# Patient Record
Sex: Female | Born: 1950
Health system: Southern US, Community
[De-identification: ages and names within clinical notes are randomized; demographics above are authoritative.]

## PROBLEM LIST (undated history)

## (undated) DIAGNOSIS — Z972 Presence of dental prosthetic device (complete) (partial): Secondary | ICD-10-CM

## (undated) DIAGNOSIS — I10 Essential (primary) hypertension: Secondary | ICD-10-CM

## (undated) DIAGNOSIS — E079 Disorder of thyroid, unspecified: Secondary | ICD-10-CM

## (undated) DIAGNOSIS — N289 Disorder of kidney and ureter, unspecified: Secondary | ICD-10-CM

## (undated) DIAGNOSIS — G629 Polyneuropathy, unspecified: Secondary | ICD-10-CM

## (undated) DIAGNOSIS — A4902 Methicillin resistant Staphylococcus aureus infection, unspecified site: Secondary | ICD-10-CM

## (undated) DIAGNOSIS — D509 Iron deficiency anemia, unspecified: Secondary | ICD-10-CM

## (undated) DIAGNOSIS — A419 Sepsis, unspecified organism: Secondary | ICD-10-CM

## (undated) DIAGNOSIS — K219 Gastro-esophageal reflux disease without esophagitis: Secondary | ICD-10-CM

## (undated) DIAGNOSIS — N186 End stage renal disease: Secondary | ICD-10-CM

## (undated) DIAGNOSIS — J189 Pneumonia, unspecified organism: Secondary | ICD-10-CM

## (undated) DIAGNOSIS — E785 Hyperlipidemia, unspecified: Secondary | ICD-10-CM

## (undated) DIAGNOSIS — R569 Unspecified convulsions: Secondary | ICD-10-CM

## (undated) HISTORY — DX: Unspecified convulsions: R56.9

## (undated) HISTORY — PX: DILATION AND CURETTAGE OF UTERUS: SHX78

## (undated) HISTORY — DX: Iron deficiency anemia, unspecified: D50.9

## (undated) HISTORY — DX: Methicillin resistant Staphylococcus aureus infection, unspecified site: A49.02

## (undated) HISTORY — PX: EYE SURGERY: SHX253

---

## 2002-11-13 ENCOUNTER — Ambulatory Visit (HOSPITAL_COMMUNITY): Admission: RE | Admit: 2002-11-13 | Discharge: 2002-11-13 | Payer: Self-pay | Admitting: Internal Medicine

## 2004-03-17 ENCOUNTER — Ambulatory Visit: Payer: Self-pay | Admitting: Family Medicine

## 2004-03-18 ENCOUNTER — Ambulatory Visit: Payer: Self-pay | Admitting: Internal Medicine

## 2004-03-21 ENCOUNTER — Ambulatory Visit: Payer: Self-pay | Admitting: *Deleted

## 2004-05-06 ENCOUNTER — Ambulatory Visit: Payer: Self-pay | Admitting: Internal Medicine

## 2004-05-16 ENCOUNTER — Ambulatory Visit: Payer: Self-pay | Admitting: Internal Medicine

## 2004-05-24 ENCOUNTER — Ambulatory Visit: Payer: Self-pay | Admitting: Internal Medicine

## 2004-06-08 ENCOUNTER — Ambulatory Visit: Payer: Self-pay | Admitting: Internal Medicine

## 2004-06-08 ENCOUNTER — Ambulatory Visit (HOSPITAL_COMMUNITY): Admission: RE | Admit: 2004-06-08 | Discharge: 2004-06-08 | Payer: Self-pay | Admitting: Internal Medicine

## 2004-08-19 ENCOUNTER — Ambulatory Visit: Payer: Self-pay | Admitting: Family Medicine

## 2004-09-22 ENCOUNTER — Emergency Department (HOSPITAL_COMMUNITY): Admission: EM | Admit: 2004-09-22 | Discharge: 2004-09-22 | Payer: Self-pay | Admitting: Emergency Medicine

## 2004-10-04 ENCOUNTER — Ambulatory Visit: Payer: Self-pay | Admitting: Internal Medicine

## 2004-11-07 ENCOUNTER — Ambulatory Visit: Payer: Self-pay | Admitting: Internal Medicine

## 2005-01-05 ENCOUNTER — Ambulatory Visit: Payer: Self-pay | Admitting: Nurse Practitioner

## 2005-01-26 ENCOUNTER — Emergency Department (HOSPITAL_COMMUNITY): Admission: EM | Admit: 2005-01-26 | Discharge: 2005-01-26 | Payer: Self-pay | Admitting: Emergency Medicine

## 2005-01-27 ENCOUNTER — Ambulatory Visit: Payer: Self-pay | Admitting: Internal Medicine

## 2005-01-30 ENCOUNTER — Ambulatory Visit: Payer: Self-pay | Admitting: Internal Medicine

## 2005-02-20 ENCOUNTER — Ambulatory Visit: Payer: Self-pay | Admitting: Internal Medicine

## 2005-03-02 ENCOUNTER — Ambulatory Visit: Payer: Self-pay | Admitting: Internal Medicine

## 2005-03-08 ENCOUNTER — Ambulatory Visit: Payer: Self-pay | Admitting: Internal Medicine

## 2007-01-14 DIAGNOSIS — E785 Hyperlipidemia, unspecified: Secondary | ICD-10-CM

## 2007-01-14 DIAGNOSIS — F329 Major depressive disorder, single episode, unspecified: Secondary | ICD-10-CM

## 2007-01-14 DIAGNOSIS — F32A Depression, unspecified: Secondary | ICD-10-CM | POA: Insufficient documentation

## 2007-01-14 DIAGNOSIS — F411 Generalized anxiety disorder: Secondary | ICD-10-CM | POA: Insufficient documentation

## 2007-01-14 DIAGNOSIS — E119 Type 2 diabetes mellitus without complications: Secondary | ICD-10-CM | POA: Insufficient documentation

## 2007-01-14 DIAGNOSIS — D649 Anemia, unspecified: Secondary | ICD-10-CM | POA: Insufficient documentation

## 2007-01-14 DIAGNOSIS — E1140 Type 2 diabetes mellitus with diabetic neuropathy, unspecified: Secondary | ICD-10-CM

## 2007-01-14 DIAGNOSIS — I10 Essential (primary) hypertension: Secondary | ICD-10-CM | POA: Insufficient documentation

## 2008-12-31 ENCOUNTER — Emergency Department (HOSPITAL_COMMUNITY): Admission: EM | Admit: 2008-12-31 | Discharge: 2008-12-31 | Payer: Self-pay | Admitting: Emergency Medicine

## 2010-01-06 ENCOUNTER — Emergency Department (HOSPITAL_COMMUNITY): Admission: EM | Admit: 2010-01-06 | Discharge: 2010-01-06 | Payer: Self-pay | Admitting: Emergency Medicine

## 2010-10-09 LAB — BASIC METABOLIC PANEL
BUN: 30 mg/dL — ABNORMAL HIGH (ref 6–23)
CO2: 22 mEq/L (ref 19–32)
Calcium: 8.5 mg/dL (ref 8.4–10.5)
Chloride: 105 mEq/L (ref 96–112)
Creatinine, Ser: 2.15 mg/dL — ABNORMAL HIGH (ref 0.4–1.2)
GFR calc Af Amer: 28 mL/min — ABNORMAL LOW (ref >60)
GFR calc non Af Amer: 23 mL/min — ABNORMAL LOW (ref >60)
Glucose, Bld: 407 mg/dL — ABNORMAL HIGH (ref 70–99)
Potassium: 3.4 mEq/L — ABNORMAL LOW (ref 3.5–5.1)
Sodium: 137 mEq/L (ref 135–145)

## 2010-10-09 LAB — URINALYSIS, ROUTINE W REFLEX MICROSCOPIC
Bilirubin Urine: NEGATIVE
Glucose, UA: >1000 mg/dL — AB
Ketones, ur: NEGATIVE mg/dL
Leukocytes, UA: NEGATIVE
Nitrite: NEGATIVE
Protein, ur: 100 mg/dL — AB
Specific Gravity, Urine: 1.013 (ref 1.005–1.030)
Urobilinogen, UA: 0.2 mg/dL (ref 0.0–1.0)
pH: 7 (ref 5.0–8.0)

## 2010-10-09 LAB — GLUCOSE, CAPILLARY
Comment 2: 0
Glucose-Capillary: 173 mg/dL — ABNORMAL HIGH (ref 70–99)
Glucose-Capillary: 269 mg/dL — ABNORMAL HIGH (ref 70–99)
Glucose-Capillary: 485 mg/dL — ABNORMAL HIGH (ref 70–99)

## 2010-10-09 LAB — DIFFERENTIAL
Basophils Absolute: 0 10*3/uL (ref 0.0–0.1)
Basophils Relative: 0 % (ref 0–1)
Eosinophils Absolute: 0.1 10*3/uL (ref 0.0–0.7)
Eosinophils Relative: 3 % (ref 0–5)
Lymphocytes Relative: 12 % (ref 12–46)
Lymphs Abs: 0.7 10*3/uL (ref 0.7–4.0)
Monocytes Absolute: 0.4 10*3/uL (ref 0.1–1.0)
Monocytes Relative: 7 % (ref 3–12)
Neutro Abs: 4.1 10*3/uL (ref 1.7–7.7)
Neutrophils Relative %: 77 % (ref 43–77)

## 2010-10-09 LAB — POCT I-STAT, CHEM 8
BUN: 28 mg/dL — ABNORMAL HIGH (ref 6–23)
Calcium, Ion: 1.17 mmol/L (ref 1.12–1.32)
Chloride: 109 mEq/L (ref 96–112)
Creatinine, Ser: 1.9 mg/dL — ABNORMAL HIGH (ref 0.4–1.2)
Glucose, Bld: 172 mg/dL — ABNORMAL HIGH (ref 70–99)
HCT: 33 % — ABNORMAL LOW (ref 36.0–46.0)
Hemoglobin: 11.2 g/dL — ABNORMAL LOW (ref 12.0–15.0)
Potassium: 3.3 mEq/L — ABNORMAL LOW (ref 3.5–5.1)
Sodium: 143 mEq/L (ref 135–145)
TCO2: 21 mmol/L (ref 0–100)

## 2010-10-09 LAB — URINE CULTURE: Colony Count: >=100000

## 2010-10-09 LAB — CBC
HCT: 32.8 % — ABNORMAL LOW (ref 36.0–46.0)
Hemoglobin: 10.9 g/dL — ABNORMAL LOW (ref 12.0–15.0)
MCHC: 33.4 g/dL (ref 30.0–36.0)
MCV: 83.1 fL (ref 78.0–100.0)
Platelets: 211 10*3/uL (ref 150–400)
RBC: 3.94 MIL/uL (ref 3.87–5.11)
RDW: 13.8 % (ref 11.5–15.5)
WBC: 5.3 10*3/uL (ref 4.0–10.5)

## 2010-10-09 LAB — URINE MICROSCOPIC-ADD ON

## 2012-01-30 ENCOUNTER — Encounter (HOSPITAL_COMMUNITY): Payer: Self-pay | Admitting: Emergency Medicine

## 2012-01-30 ENCOUNTER — Emergency Department (HOSPITAL_COMMUNITY)
Admission: EM | Admit: 2012-01-30 | Discharge: 2012-01-30 | Disposition: A | Discharge status: Home / Self Care | Payer: Self-pay | Attending: Emergency Medicine | Admitting: Emergency Medicine

## 2012-01-30 DIAGNOSIS — I10 Essential (primary) hypertension: Secondary | ICD-10-CM | POA: Insufficient documentation

## 2012-01-30 DIAGNOSIS — E079 Disorder of thyroid, unspecified: Secondary | ICD-10-CM | POA: Insufficient documentation

## 2012-01-30 DIAGNOSIS — Z79899 Other long term (current) drug therapy: Secondary | ICD-10-CM | POA: Insufficient documentation

## 2012-01-30 DIAGNOSIS — L0231 Cutaneous abscess of buttock: Secondary | ICD-10-CM | POA: Insufficient documentation

## 2012-01-30 DIAGNOSIS — E785 Hyperlipidemia, unspecified: Secondary | ICD-10-CM | POA: Insufficient documentation

## 2012-01-30 DIAGNOSIS — Z794 Long term (current) use of insulin: Secondary | ICD-10-CM | POA: Insufficient documentation

## 2012-01-30 DIAGNOSIS — E119 Type 2 diabetes mellitus without complications: Secondary | ICD-10-CM | POA: Insufficient documentation

## 2012-01-30 HISTORY — DX: Hyperlipidemia, unspecified: E78.5

## 2012-01-30 HISTORY — DX: Disorder of kidney and ureter, unspecified: N28.9

## 2012-01-30 HISTORY — DX: Disorder of thyroid, unspecified: E07.9

## 2012-01-30 HISTORY — DX: Essential (primary) hypertension: I10

## 2012-01-30 MED ORDER — HYDROCODONE-ACETAMINOPHEN 5-500 MG PO TABS: 1.0000 | ORAL_TABLET | Freq: Four times a day (QID) | ORAL | Status: AC | PRN

## 2012-01-30 MED ORDER — LIDOCAINE-EPINEPHRINE 2 %-1:100000 IJ SOLN
20.0000 mL | Freq: Once | INTRAMUSCULAR | Status: DC
  Filled 2012-01-30: qty 1

## 2012-01-30 NOTE — ED Provider Notes (Signed)
History     CSN: [id number]  Arrival date & time 01/30/12  1702   First MD Initiated Contact with Patient 01/30/12 1754      Chief Complaint  Patient presents with  . Abscess    (Consider location/radiation/quality/duration/timing/severity/associated sxs/prior treatment) HPI  61 year old female with history of diabetes presents for evaluation of an abscess to her R buttock.  Hx was obtain through family member who help translating. Patient notice abscess for the past week. Onset was gradual, persistent, worsening with sitting, palpation. Initially she noticed a pimple which she scratched.  Later it formed a red swollen area with drainage from it.  Pain is sharp, throbbing and aching worsening with sitting, or with palpation.  Denies urinary or bowel sxs.  Denies fever but endorse chills.  Was seen at her PCP yesterday for this and was given clindamycin abx.  Sts pain is worsen and would like further care.  Denies hx of prior abscess.    Past Medical History  Diagnosis Date  . Diabetes mellitus   . Hypertension   . Hyperlipidemia   . Renal disorder   . Thyroid disease     History reviewed. No pertinent past surgical history.  No family history on file.  History  Substance Use Topics  . Smoking status: Not on file  . Smokeless tobacco: Not on file  . Alcohol Use:     OB History    Grav Para Term Preterm Abortions TAB SAB Ect Mult Living                  Review of Systems  All other systems reviewed and are negative.    Allergies  Review of patient's allergies indicates no known allergies.  Home Medications   Current Outpatient Rx  Name Route Sig Dispense Refill  . AMLODIPINE BESYLATE 5 MG PO TABS Oral Take 5 mg by mouth daily.    Marland Kitchen CLINDAMYCIN HCL 150 MG PO CAPS Oral Take 300 mg by mouth 2 (two) times daily.    . INSULIN GLARGINE 100 UNIT/ML [place] SOLN Subcutaneous Inject 35 Units into the skin 2 (two) times daily.    Marland Kitchen PARICALCITOL 1 MCG PO CAPS Oral Take 1 mcg  by mouth daily.    Marland Kitchen PRESCRIPTION MEDICATION Oral Take 1 tablet by mouth daily. Prescription medication for blood sugar. Sample from Dr. Lemmie Evens. [facility]'s office.    Marland Kitchen ROSUVASTATIN CALCIUM 10 MG PO TABS Oral Take 10 mg by mouth daily.    Marland Kitchen SITAGLIPTIN PHOSPHATE 50 MG PO TABS Oral Take 50 mg by mouth daily.      BP 140/66  Pulse 78  Temp 98.3 F (36.8 C) (Oral)  Resp 16  SpO2 100%  Physical Exam   Physical Exam  Nursing note and vitals reviewed. Constitutional: She appears well-developed and well-nourished.        obese  Eyes: Conjunctivae are normal.  Neck: Neck supple.  Abdominal: Soft. There is no tenderness.  Neurological: She is alert.  Skin: Skin is warm.     Psychiatric: She has a normal mood and affect.    ED Course  Procedures (including critical care time)  Labs Reviewed - No data to display No results found.   No diagnosis found.  INCISION AND DRAINAGE Performed by: Domenic Moras Consent: Verbal consent obtained. Risks and benefits: risks, benefits and alternatives were discussed Type: abscess  Body area: R lateral buttock  Anesthesia: local infiltration  Local anesthetic: lidocaine 2% w epinephrine  Anesthetic total: 6 ml  Complexity: complex Blunt dissection to break up loculations  Drainage: purulent  Drainage amount: moderate  Packing material: 1/4 in iodoform gauze  Patient tolerance: Patient tolerated the procedure well with no immediate complications.  1. R buttock abscess    ED Course  Procedures (including critical care time)  Labs Reviewed - No data to display No results found.   No diagnosis found.    MDM  Abscess to R latteral buttock with successful I&D.  Care instruction and f/u instruction given.          Domenic Moras, PA-C 01/30/12 1840

## 2012-01-30 NOTE — ED Notes (Signed)
Pt has abscess to R buttock for one week. Pt has large red swollen area to R buttock with excoriation to middle. Pus drainage from wound.

## 2012-01-31 NOTE — ED Provider Notes (Signed)
Medical screening examination/treatment/procedure(s) were performed by non-physician practitioner and as supervising physician I was immediately available for consultation/collaboration.   Saddie Benders. Dorna Mai, MD 01/31/12 [id number]

## 2012-02-01 ENCOUNTER — Encounter (HOSPITAL_COMMUNITY): Payer: Self-pay | Admitting: Emergency Medicine

## 2012-02-01 ENCOUNTER — Emergency Department (HOSPITAL_COMMUNITY)
Admission: EM | Admit: 2012-02-01 | Discharge: 2012-02-01 | Disposition: A | Discharge status: Home / Self Care | Payer: Self-pay | Attending: Emergency Medicine | Admitting: Emergency Medicine

## 2012-02-01 DIAGNOSIS — I10 Essential (primary) hypertension: Secondary | ICD-10-CM | POA: Insufficient documentation

## 2012-02-01 DIAGNOSIS — E119 Type 2 diabetes mellitus without complications: Secondary | ICD-10-CM | POA: Insufficient documentation

## 2012-02-01 DIAGNOSIS — Z5189 Encounter for other specified aftercare: Secondary | ICD-10-CM

## 2012-02-01 DIAGNOSIS — Z794 Long term (current) use of insulin: Secondary | ICD-10-CM | POA: Insufficient documentation

## 2012-02-01 DIAGNOSIS — Z4801 Encounter for change or removal of surgical wound dressing: Secondary | ICD-10-CM | POA: Insufficient documentation

## 2012-02-01 DIAGNOSIS — E785 Hyperlipidemia, unspecified: Secondary | ICD-10-CM | POA: Insufficient documentation

## 2012-02-01 DIAGNOSIS — Z79899 Other long term (current) drug therapy: Secondary | ICD-10-CM | POA: Insufficient documentation

## 2012-02-01 DIAGNOSIS — E079 Disorder of thyroid, unspecified: Secondary | ICD-10-CM | POA: Insufficient documentation

## 2012-02-01 NOTE — ED Provider Notes (Signed)
History     CSN: [id number]  Arrival date & time 02/01/12  1431   First MD Initiated Contact with Patient 02/01/12 1622      Chief Complaint  Patient presents with  . Wound Check    (Consider location/radiation/quality/duration/timing/severity/associated sxs/prior treatment) HPI Comments: Pt seen two days ago in ED for abscess I&D.  Was seen one day prior to this at PCP and was given clindamycin for cellulitis.  Abscess I&D'd in ED.  Pt reports great improvement over two days.  States it continues to drain but is less swollen, less painful.  She is now able to sit comfortably and is not using much of her pain medication.  Denies fevers, chills.    The history is provided by the patient.    Past Medical History  Diagnosis Date  . Diabetes mellitus   . Hypertension   . Hyperlipidemia   . Renal disorder   . Thyroid disease     History reviewed. No pertinent past surgical history.  No family history on file.  History  Substance Use Topics  . Smoking status: Not on file  . Smokeless tobacco: Not on file  . Alcohol Use:     OB History    Grav Para Term Preterm Abortions TAB SAB Ect Mult Living                  Review of Systems  Constitutional: Negative for fever and chills.  Skin: Positive for wound.    Allergies  Review of patient's allergies indicates no known allergies.  Home Medications   Current Outpatient Rx  Name Route Sig Dispense Refill  . AMLODIPINE BESYLATE 5 MG PO TABS Oral Take 5 mg by mouth daily.    Marland Kitchen CLINDAMYCIN HCL 150 MG PO CAPS Oral Take 300 mg by mouth 2 (two) times daily.    Marland Kitchen HYDROCODONE-ACETAMINOPHEN 5-500 MG PO TABS Oral Take 1-2 tablets by mouth every 6 (six) hours as needed for pain. 15 tablet 0  . INSULIN GLARGINE 100 UNIT/ML [place] SOLN Subcutaneous Inject 35 Units into the skin 2 (two) times daily.    Marland Kitchen PARICALCITOL 1 MCG PO CAPS Oral Take 1 mcg by mouth daily.    Marland Kitchen PRESCRIPTION MEDICATION Oral Take 1 tablet by mouth daily.  Prescription medication for blood sugar. Sample from Dr. Lemmie Evens. [facility]'s office.    Marland Kitchen ROSUVASTATIN CALCIUM 10 MG PO TABS Oral Take 10 mg by mouth daily.    Marland Kitchen SITAGLIPTIN PHOSPHATE 50 MG PO TABS Oral Take 50 mg by mouth daily.      BP 115/63  Pulse 67  Temp 98.2 F (36.8 C)  Resp 18  SpO2 99%  Physical Exam  Nursing note and vitals reviewed. Constitutional: She appears well-developed and well-nourished. No distress.  HENT:  Head: Normocephalic and atraumatic.  Neck: Neck supple.  Pulmonary/Chest: Effort normal.  Neurological: She is alert.  Skin: She is not diaphoretic.       ED Course  Procedures (including critical care time)  Labs Reviewed - No data to display No results found.    1. Wound check, abscess       MDM  Afebrile nontoxic patient presents for check of abscess of right buttock.  Packing removed by me.  No need for further packing at this time.  Pt reports improvement of her pain, swelling, and no further fevers.  She is using the antibiotics as prescribed.  Discussed wound care with patient and family.  Pt d/c home with PCP  follow up, return for worsening symptoms.  Return precautions given.  Pt and family verbalize understanding and agree with plan.          [place], [place] 02/01/12 1646

## 2012-02-01 NOTE — ED Provider Notes (Signed)
Medical screening examination/treatment/procedure(s) were performed by non-physician practitioner and as supervising physician I was immediately available for consultation/collaboration.   Saddie Benders. Doris Mcgilvery, MD 02/01/12 1656

## 2012-02-01 NOTE — ED Notes (Signed)
Pt here for recheck of abscess that was drained 2 days ago.

## 2014-12-04 ENCOUNTER — Other Ambulatory Visit: Payer: Self-pay

## 2014-12-04 DIAGNOSIS — I83813 Varicose veins of bilateral lower extremities with pain: Secondary | ICD-10-CM

## 2015-02-16 ENCOUNTER — Encounter (HOSPITAL_COMMUNITY): Payer: Self-pay

## 2015-02-16 ENCOUNTER — Encounter: Payer: Self-pay | Admitting: Vascular Surgery

## 2015-04-02 ENCOUNTER — Encounter: Payer: Self-pay | Admitting: Vascular Surgery

## 2015-04-06 ENCOUNTER — Encounter (HOSPITAL_COMMUNITY): Payer: Self-pay

## 2015-04-06 ENCOUNTER — Encounter: Payer: Self-pay | Admitting: Vascular Surgery

## 2015-05-17 ENCOUNTER — Encounter: Payer: Self-pay | Attending: Surgery | Admitting: Surgery

## 2015-05-17 ENCOUNTER — Inpatient Hospital Stay (HOSPITAL_COMMUNITY)
Admission: EM | Admit: 2015-05-17 | Discharge: 2015-05-23 | DRG: 617 | Disposition: A | Discharge status: Home / Self Care | Payer: Medicaid Other | Attending: Internal Medicine | Admitting: Internal Medicine

## 2015-05-17 ENCOUNTER — Emergency Department (HOSPITAL_COMMUNITY): Payer: Medicaid Other

## 2015-05-17 ENCOUNTER — Encounter (HOSPITAL_COMMUNITY): Payer: Self-pay | Admitting: *Deleted

## 2015-05-17 DIAGNOSIS — E1140 Type 2 diabetes mellitus with diabetic neuropathy, unspecified: Secondary | ICD-10-CM | POA: Diagnosis present

## 2015-05-17 DIAGNOSIS — M79673 Pain in unspecified foot: Secondary | ICD-10-CM

## 2015-05-17 DIAGNOSIS — E1169 Type 2 diabetes mellitus with other specified complication: Secondary | ICD-10-CM | POA: Diagnosis not present

## 2015-05-17 DIAGNOSIS — N179 Acute kidney failure, unspecified: Secondary | ICD-10-CM | POA: Diagnosis present

## 2015-05-17 DIAGNOSIS — M86172 Other acute osteomyelitis, left ankle and foot: Secondary | ICD-10-CM | POA: Insufficient documentation

## 2015-05-17 DIAGNOSIS — IMO0002 Reserved for concepts with insufficient information to code with codable children: Secondary | ICD-10-CM | POA: Insufficient documentation

## 2015-05-17 DIAGNOSIS — E11628 Type 2 diabetes mellitus with other skin complications: Secondary | ICD-10-CM

## 2015-05-17 DIAGNOSIS — I10 Essential (primary) hypertension: Secondary | ICD-10-CM | POA: Insufficient documentation

## 2015-05-17 DIAGNOSIS — M869 Osteomyelitis, unspecified: Secondary | ICD-10-CM | POA: Diagnosis not present

## 2015-05-17 DIAGNOSIS — E1121 Type 2 diabetes mellitus with diabetic nephropathy: Secondary | ICD-10-CM

## 2015-05-17 DIAGNOSIS — N184 Chronic kidney disease, stage 4 (severe): Secondary | ICD-10-CM | POA: Diagnosis present

## 2015-05-17 DIAGNOSIS — E860 Dehydration: Secondary | ICD-10-CM | POA: Diagnosis present

## 2015-05-17 DIAGNOSIS — E11621 Type 2 diabetes mellitus with foot ulcer: Secondary | ICD-10-CM | POA: Diagnosis present

## 2015-05-17 DIAGNOSIS — M79662 Pain in left lower leg: Secondary | ICD-10-CM

## 2015-05-17 DIAGNOSIS — E1165 Type 2 diabetes mellitus with hyperglycemia: Secondary | ICD-10-CM | POA: Diagnosis present

## 2015-05-17 DIAGNOSIS — I87312 Chronic venous hypertension (idiopathic) with ulcer of left lower extremity: Secondary | ICD-10-CM | POA: Insufficient documentation

## 2015-05-17 DIAGNOSIS — D62 Acute posthemorrhagic anemia: Secondary | ICD-10-CM | POA: Diagnosis not present

## 2015-05-17 DIAGNOSIS — L089 Local infection of the skin and subcutaneous tissue, unspecified: Secondary | ICD-10-CM

## 2015-05-17 DIAGNOSIS — R1084 Generalized abdominal pain: Secondary | ICD-10-CM | POA: Diagnosis present

## 2015-05-17 DIAGNOSIS — I129 Hypertensive chronic kidney disease with stage 1 through stage 4 chronic kidney disease, or unspecified chronic kidney disease: Secondary | ICD-10-CM | POA: Diagnosis present

## 2015-05-17 DIAGNOSIS — E1122 Type 2 diabetes mellitus with diabetic chronic kidney disease: Secondary | ICD-10-CM | POA: Diagnosis present

## 2015-05-17 DIAGNOSIS — L97524 Non-pressure chronic ulcer of other part of left foot with necrosis of bone: Secondary | ICD-10-CM | POA: Diagnosis present

## 2015-05-17 DIAGNOSIS — Z794 Long term (current) use of insulin: Secondary | ICD-10-CM

## 2015-05-17 DIAGNOSIS — R109 Unspecified abdominal pain: Secondary | ICD-10-CM

## 2015-05-17 DIAGNOSIS — L97522 Non-pressure chronic ulcer of other part of left foot with fat layer exposed: Secondary | ICD-10-CM | POA: Insufficient documentation

## 2015-05-17 DIAGNOSIS — E11649 Type 2 diabetes mellitus with hypoglycemia without coma: Secondary | ICD-10-CM | POA: Diagnosis not present

## 2015-05-17 DIAGNOSIS — R1031 Right lower quadrant pain: Secondary | ICD-10-CM | POA: Diagnosis present

## 2015-05-17 DIAGNOSIS — N1330 Unspecified hydronephrosis: Secondary | ICD-10-CM | POA: Diagnosis present

## 2015-05-17 DIAGNOSIS — M79661 Pain in right lower leg: Secondary | ICD-10-CM

## 2015-05-17 DIAGNOSIS — E785 Hyperlipidemia, unspecified: Secondary | ICD-10-CM | POA: Diagnosis present

## 2015-05-17 DIAGNOSIS — E119 Type 2 diabetes mellitus without complications: Secondary | ICD-10-CM | POA: Insufficient documentation

## 2015-05-17 DIAGNOSIS — D631 Anemia in chronic kidney disease: Secondary | ICD-10-CM | POA: Diagnosis present

## 2015-05-17 DIAGNOSIS — L02612 Cutaneous abscess of left foot: Secondary | ICD-10-CM | POA: Diagnosis present

## 2015-05-17 LAB — COMPREHENSIVE METABOLIC PANEL
ALT: 16 U/L (ref 14–54)
AST: 26 U/L (ref 15–41)
Albumin: 3.4 g/dL — ABNORMAL LOW (ref 3.5–5.0)
Alkaline Phosphatase: 90 U/L (ref 38–126)
Anion gap: 11 (ref 5–15)
BILIRUBIN TOTAL: 0.4 mg/dL (ref 0.3–1.2)
BUN: 41 mg/dL — ABNORMAL HIGH (ref 6–20)
CO2: 23 mmol/L (ref 22–32)
Calcium: 9.4 mg/dL (ref 8.9–10.3)
Chloride: 107 mmol/L (ref 101–111)
Creatinine, Ser: 2.49 mg/dL — ABNORMAL HIGH (ref 0.44–1.00)
GFR calc Af Amer: 22 mL/min — ABNORMAL LOW (ref >60)
GFR calc non Af Amer: 19 mL/min — ABNORMAL LOW (ref >60)
GLUCOSE: 99 mg/dL (ref 65–99)
Potassium: 4.1 mmol/L (ref 3.5–5.1)
Sodium: 141 mmol/L (ref 135–145)
Total Protein: 7.5 g/dL (ref 6.5–8.1)

## 2015-05-17 LAB — CBC
HCT: 34.2 % — ABNORMAL LOW (ref 36.0–46.0)
Hemoglobin: 11 g/dL — ABNORMAL LOW (ref 12.0–15.0)
MCH: 28.2 pg (ref 26.0–34.0)
MCHC: 32.2 g/dL (ref 30.0–36.0)
MCV: 87.7 fL (ref 78.0–100.0)
Platelets: 249 10*3/uL (ref 150–400)
RBC: 3.9 MIL/uL (ref 3.87–5.11)
RDW: 13.5 % (ref 11.5–15.5)
WBC: 7.5 10*3/uL (ref 4.0–10.5)

## 2015-05-17 LAB — SEDIMENTATION RATE: Sed Rate: 99 mm/hr — ABNORMAL HIGH (ref 0–22)

## 2015-05-17 LAB — C-REACTIVE PROTEIN: CRP: 5.9 mg/dL — ABNORMAL HIGH (ref <1.0)

## 2015-05-17 MED ORDER — MORPHINE SULFATE (PF) 4 MG/ML IV SOLN
4.0000 mg | Freq: Once | INTRAVENOUS | Status: AC
Start: 2015-05-17 — End: 2015-05-17
  Administered 2015-05-17: 4 mg via INTRAVENOUS
  Filled 2015-05-17: qty 1

## 2015-05-17 NOTE — ED Notes (Addendum)
Per family, pt having infection to left foot x 2 months. Infection is to left 4th toe. Went to wound center today and told to come here due to infection. Denies fever. Bandage on pta and ortho boot.

## 2015-05-17 NOTE — ED Provider Notes (Signed)
CSN: [id number]     Arrival date & time 05/17/15  1652 History   First MD Initiated Contact with Patient 05/17/15 2055     Chief Complaint  Patient presents with  . Wound Infection     (Consider location/radiation/quality/duration/timing/severity/associated sxs/prior Treatment) Patient is a 64 y.o. female presenting with lower extremity pain. The history is provided by the patient.  Foot Pain This is a chronic problem. The current episode started more than 1 month ago. The problem occurs constantly. The problem has been gradually worsening. Associated symptoms include abdominal pain, arthralgias, fatigue and joint swelling. Pertinent negatives include no chest pain, chills, diaphoresis, fever, headaches, myalgias, nausea, neck pain, numbness, rash, sore throat, urinary symptoms, vomiting or weakness. The symptoms are aggravated by standing and walking. She has tried rest for the symptoms. The treatment provided no relief.    Past Medical History  Diagnosis Date  . Diabetes mellitus   . Hypertension   . Hyperlipidemia   . Renal disorder   . Thyroid disease    History reviewed. No pertinent past surgical history. History reviewed. No pertinent family history. Social History  Substance Use Topics  . Smoking status: None  . Smokeless tobacco: None  . Alcohol Use: None   OB History    No data available     Review of Systems  Constitutional: Positive for fatigue. Negative for fever, chills and diaphoresis.  HENT: Negative for facial swelling and sore throat.   Respiratory: Negative for shortness of breath.   Cardiovascular: Negative for chest pain.  Gastrointestinal: Positive for abdominal pain. Negative for nausea and vomiting.  Genitourinary: Negative for dysuria.  Musculoskeletal: Positive for joint swelling, arthralgias and gait problem. Negative for myalgias, back pain and neck pain.  Skin: Positive for color change and wound. Negative for rash.  Neurological: Negative for  weakness, numbness and headaches.  Psychiatric/Behavioral: Negative for confusion.      Allergies  Review of patient's allergies indicates no known allergies.  Home Medications   Prior to Admission medications   Medication Sig Start Date End Date Taking? Authorizing Provider  acetaminophen (TYLENOL) 325 MG tablet Take 650 mg by mouth every 6 (six) hours as needed for mild pain.   Yes Historical Provider, MD  amLODipine (NORVASC) 5 MG tablet Take 5 mg by mouth daily.   Yes Historical Provider, MD  clindamycin (CLEOCIN) 150 MG capsule Take 300 mg by mouth 2 (two) times daily. 01/29/12  Yes Historical Provider, MD  insulin glargine (LANTUS) 100 UNIT/ML injection Inject 35 Units into the skin 2 (two) times daily.   Yes Historical Provider, MD  paricalcitol (ZEMPLAR) 1 MCG capsule Take 1 mcg by mouth daily.   Yes Historical Provider, MD  rosuvastatin (CRESTOR) 10 MG tablet Take 10 mg by mouth daily.   Yes Historical Provider, MD  sitaGLIPtin (JANUVIA) 50 MG tablet Take 50 mg by mouth daily.   Yes Historical Provider, MD   BP 139/65 mmHg  Pulse 70  Temp(Src) 98.5 F (36.9 C) (Oral)  Resp 18  SpO2 97% Physical Exam  Constitutional: She is oriented to person, place, and time. She appears well-developed and well-nourished. No distress.  HENT:  Head: Normocephalic and atraumatic.  Right Ear: External ear normal.  Left Ear: External ear normal.  Nose: Nose normal.  Mouth/Throat: Oropharynx is clear and moist. No oropharyngeal exudate.  Eyes: Conjunctivae and EOM are normal. Pupils are equal, round, and reactive to light. Right eye exhibits no discharge. Left eye exhibits no discharge. No scleral  icterus.  Neck: Normal range of motion. Neck supple. No JVD present. No tracheal deviation present. No thyromegaly present.  Cardiovascular: Normal rate, regular rhythm and intact distal pulses.  Exam reveals decreased pulses.   Pulmonary/Chest: Effort normal and breath sounds normal. No stridor. No  respiratory distress. She has no wheezes. She has no rales. She exhibits no tenderness.  Abdominal: Soft. She exhibits no distension. There is no tenderness.  Musculoskeletal: Normal range of motion. She exhibits edema and tenderness.       Left ankle: She exhibits abnormal pulse.       Feet:  Monophasic doppler signal noted in PT and DP.  Very diminished.  Skin changes consistent with venous stasis.  Lymphadenopathy:    She has no cervical adenopathy.  Neurological: She is alert and oriented to person, place, and time. She exhibits normal muscle tone.  Skin: Skin is warm and dry. No rash noted. She is not diaphoretic. No erythema. No pallor.  Psychiatric: She has a normal mood and affect. Her behavior is normal. Judgment and thought content normal.  Nursing note and vitals reviewed.   ED Course  Procedures (including critical care time) Labs Review Labs Reviewed  COMPREHENSIVE METABOLIC PANEL - Abnormal; Notable for the following:    BUN 41 (*)    Creatinine, Ser 2.49 (*)    Albumin 3.4 (*)    GFR calc non Af Amer 19 (*)    GFR calc Af Amer 22 (*)    All other components within normal limits  CBC - Abnormal; Notable for the following:    Hemoglobin 11.0 (*)    HCT 34.2 (*)    All other components within normal limits  SEDIMENTATION RATE - Abnormal; Notable for the following:    Sed Rate 99 (*)    All other components within normal limits  C-REACTIVE PROTEIN - Abnormal; Notable for the following:    CRP 5.9 (*)    All other components within normal limits  PROTIME-INR - Abnormal; Notable for the following:    Prothrombin Time 16.0 (*)    All other components within normal limits  APTT - Abnormal; Notable for the following:    aPTT 43 (*)    All other components within normal limits  BASIC METABOLIC PANEL - Abnormal; Notable for the following:    BUN 40 (*)    Creatinine, Ser 2.31 (*)    GFR calc non Af Amer 21 (*)    GFR calc Af Amer 24 (*)    All other components  within normal limits  CBC - Abnormal; Notable for the following:    RBC 3.66 (*)    Hemoglobin 10.5 (*)    HCT 32.5 (*)    All other components within normal limits  LIPID PANEL - Abnormal; Notable for the following:    Triglycerides 160 (*)    HDL 38 (*)    All other components within normal limits  GLUCOSE, CAPILLARY - Abnormal; Notable for the following:    Glucose-Capillary 131 (*)    All other components within normal limits  GLUCOSE, CAPILLARY - Abnormal; Notable for the following:    Glucose-Capillary 157 (*)    All other components within normal limits  GLUCOSE, CAPILLARY - Abnormal; Notable for the following:    Glucose-Capillary 236 (*)    All other components within normal limits  GLUCOSE, CAPILLARY - Abnormal; Notable for the following:    Glucose-Capillary 215 (*)    All other components within normal limits  CULTURE, BLOOD (ROUTINE X 2)  CULTURE, BLOOD (ROUTINE X 2)  GLUCOSE, CAPILLARY  CREATININE, URINE, RANDOM  SODIUM, URINE, RANDOM  LIPASE, BLOOD  GLUCOSE, CAPILLARY  GLUCOSE, CAPILLARY  GLUCOSE, CAPILLARY  GLUCOSE, CAPILLARY  HEMOGLOBIN A1C  CBC  BASIC METABOLIC PANEL  TYPE AND SCREEN  ABO/RH    Imaging Review Ct Abdomen Pelvis Wo Contrast  05/18/2015  CLINICAL DATA:  New onset abdominal pain EXAM: CT ABDOMEN AND PELVIS WITHOUT CONTRAST TECHNIQUE: Multidetector CT imaging of the abdomen and pelvis was performed following the standard protocol without IV contrast. COMPARISON:  None. FINDINGS: Lung bases are free of acute infiltrate or sizable effusion. A calcified granuloma is noted in the right lung base. The gallbladder, liver, spleen, adrenal glands and pancreas are within normal limits. The kidneys show mild fullness of the collecting systems bilaterally. This is likely related to the distended bladder as no definitive ureteral calculi are seen. The bladder is over distended. No filling defect is noted. Small amount of air is noted within the bladder  which may be related to recent instrumentation. Clinical correlation is recommended. Diffuse vascular calcifications are noted throughout the uterus. No pelvic mass lesion is noted. No free pelvic fluid is seen. The appendix is not well visualized. No inflammatory changes are identified. Aortoiliac calcifications are noted without aneurysmal dilatation. The osseous structures show no acute abnormality. IMPRESSION: No acute abnormality noted. The bladder is distended with associated mild hydronephrosis bilaterally. Electronically Signed   By: Inez Catalina M.D.   On: 05/18/2015 07:41   Dg Foot 2 Views Left  05/17/2015  CLINICAL DATA:  Chronic left third and fourth toe open wound for 2 months, with pain. Assess for osteomyelitis. Initial encounter. EXAM: LEFT FOOT - 2 VIEW COMPARISON:  None. FINDINGS: There is near complete absence of the third distal phalanx, with vague lucency at the third middle phalanx. This is compatible with osteomyelitis. No definite additional erosions are seen. Soft tissue swelling is noted about the third toe. Visualized joint spaces are otherwise preserved. Small plantar and posterior calcaneal spurs are seen. IMPRESSION: Near complete absence of the third distal phalanx, with vague lucency at the third middle phalanx. This is compatible with osteomyelitis. Soft tissue swelling about the third toe. Electronically Signed   By: Garald Balding M.D.   On: 05/17/2015 23:00   I have personally reviewed and evaluated these images and lab results as part of my medical decision-making.   MDM   Final diagnoses:  Osteomyelitis of left foot, unspecified chronicity ([place])   Cotrina Danzey is a 64 y.o. female patient with hx of 2 mo of worsening L foot pain with ulcer to L 3rd toe.  Pt with TTP to dorsum of foot and decreased peripheral circulation.  Hx of DM w/ unknown recent A1C level but family thinks "it was high."    Pt now unable to tolerate walking 2/2 pain.  Was seeing a  foot specialist and was placed on PO abx 1 month ago w/o improvement.  Pt sent today for worsening infection and concern for decreased blood flow.  Doppler signal is weak but intact.  Pt has osteomyelitis on plain film with almost complete erosion of 3rd distal phalanx.    Elevated inflammatory markers.  No signs of sepsis currently.  VSS/AF.  Pt evaluated by medicine and empiric abx recommended due to elevated Cr.  This is possibly chronic, but could still be acutely elevated in this clinical setting.    Pt given pain medication in the  ED prior to transfer to inpatient service.    Patient care was discussed with my attending, Dr. Kathrynn Humble.   Hoyle Sauer, MD 05/19/15 [id number]  Varney Biles, MD 05/20/15 1350

## 2015-05-17 NOTE — H&P (Addendum)
[facility] History and Physical  Tally Mattox LNL:[id number] DOB: September 01, 1950 DOA: 05/17/2015  Referring physician: ED physician PCP: No primary care provider on file.  Specialists:   Chief Complaint: Left foot pain, abdominal pain, bilateral calf pain  HPI: Sarah Kelley is a 64 y.o. female with PMH of hypertension, hyperlipidemia, diabetes mellitus, CKD-IV, who presents with left foot pain, abdominal pain, bilateral calf pain.  Patient does not become increased. She is accompanied by her granddaughter who speaks good [place]. Patient reports that she has been having infection and pain over third toe of left foot in the past 3 months. It has been progressively getting worse. She was seen by the wound care today, and she was sent to the emergency room due to severe infection. She has pus drainage from the tip of that toe. No fever, chills. She reports that she started having abdominal pain at arrival to the emergency room. No nausea, vomiting, diarrhea. Her abdominal pain is located in the lower abdomen and right lower quadrant. It is mild, constant, nonradiating. She also reports having tenderness over bilateral calf areas. No recent long distance travel history. Patient does not have chest pain, shortness breath, cough, symptoms of a UTI or unilateral weakness.  In ED, patient was found to haveWBC 7.5, temperature normal, no tachycardia, electrolytes okay, CRP 5.9, worsening renal function. Patient's admitted to inpatient for further evaluation and treatment. X-ray of the left foot showed near complete absence of the third distal phalanx, with vague lucency at the third middle phalanx. This is compatible with osteomyelitis. Soft tissue swelling about the third toe. Patient is admitted to inpatient for further evaluation and treatment.  Where does patient live?   At home    Can patient participate in ADLs?  Little  Review of Systems:   General: no fevers, chills, no  changes in body weight, has poor appetite, has fatigue HEENT: no blurry vision, hearing changes or sore throat Pulm: no dyspnea, coughing, wheezing CV: no chest pain, palpitations Abd: no nausea, vomiting, has abdominal pain, no diarrhea, constipation GU: no dysuria, burning on urination, increased urinary frequency, hematuria  Ext: has left foot pain. Has tenderness over calf areas bilaterally Neuro: no unilateral weakness, numbness, or tingling, no vision change or hearing loss Skin: no rash MSK: No muscle spasm, no deformity, no limitation of range of movement in spin Heme: No easy bruising.  Travel history: No recent long distant travel.  Allergy: No Known Allergies  Past Medical History  Diagnosis Date  . Diabetes mellitus   . Hypertension   . Hyperlipidemia   . Renal disorder   . Thyroid disease     History reviewed. No pertinent past surgical history.  Social History:  reports that she has never smoked. She has never used smokeless tobacco. She reports that she does not drink alcohol or use illicit drugs.  Family History: Reviewed, but patient does not know any family medical history.  Prior to Admission medications   Medication Sig Start Date End Date Taking? Authorizing Provider  acetaminophen (TYLENOL) 325 MG tablet Take 650 mg by mouth every 6 (six) hours as needed for mild pain.   Yes Historical Provider, MD  amLODipine (NORVASC) 5 MG tablet Take 5 mg by mouth daily.   Yes Historical Provider, MD  clindamycin (CLEOCIN) 150 MG capsule Take 300 mg by mouth 2 (two) times daily. 01/29/12  Yes Historical Provider, MD  insulin glargine (LANTUS) 100 UNIT/ML injection Inject 35 Units into the skin 2 (two) times daily.  Yes Historical Provider, MD  paricalcitol (ZEMPLAR) 1 MCG capsule Take 1 mcg by mouth daily.   Yes Historical Provider, MD  rosuvastatin (CRESTOR) 10 MG tablet Take 10 mg by mouth daily.   Yes Historical Provider, MD  sitaGLIPtin (JANUVIA) 50 MG tablet Take 50  mg by mouth daily.   Yes Historical Provider, MD    Physical Exam: Filed Vitals:   05/17/15 2046 05/17/15 2100 05/18/15 0112 05/18/15 0438  BP: 150/60 139/65 125/72 136/49  Pulse: 70 70 75 77  Temp: 98.5 F (36.9 C)  97.9 F (36.6 C) 99 F (37.2 C)  TempSrc: Oral  Oral Oral  Resp: _0 Weight:   71.351 kg (157 lb 4.8 oz)   SpO2: 97% 97% 98% 95%   General: Not in acute distress HEENT:       Eyes: PERRL, EOMI, no scleral icterus.       ENT: No discharge from the ears and nose, no pharynx injection, no tonsillar enlargement.        Neck: No JVD, no bruit, no mass felt. Heme: No neck lymph node enlargement. Cardiac: S1/S2, RRR, No murmurs, No gallops or rubs. Pulm: No rales, wheezing, rhonchi or rubs. Abd: Soft, nondistended, has tenderness over RLQ and lower abdomen, no rebound pain, no organomegaly, BS present. Ext: No pitting leg edema bilaterally. 1+DP/PT pulse bilaterally. Per EDP, pt has weak pulse by portable Doppler Musculoskeletal: No joint deformities, No joint redness or warmth, no limitation of ROM in spin. Skin: has skin erosion over the tip of 3rd toe in left foot, with pus draining. Neuro: Alert, oriented X3, cranial nerves II-XII grossly intact, muscle strength 5/5 in all extremities, sensation to light touch intact.  Psych: Patient is not psychotic, no suicidal or hemocidal ideation.  Labs on Admission:  Basic Metabolic Panel:  Recent Labs Lab 05/17/15 1754 05/18/15 0150  NA 141 138  K 4.1 3.9  CL 107 103  CO2 23 24  GLUCOSE 99 82  BUN 41* 40*  CREATININE 2.49* 2.31*  CALCIUM 9.4 9.0   Liver Function Tests:  Recent Labs Lab 05/17/15 1754  AST 26  ALT 16  ALKPHOS 90  BILITOT 0.4  PROT 7.5  ALBUMIN 3.4*    Recent Labs Lab 05/18/15 0150  LIPASE 32   No results for input(s): AMMONIA in the last 168 hours. CBC:  Recent Labs Lab 05/17/15 1754 05/18/15 0150  WBC 7.5 8.0  HGB 11.0* 10.5*  HCT 34.2* 32.5*  MCV 87.7 88.8  PLT 249  250   Cardiac Enzymes: No results for input(s): CKTOTAL, CKMB, CKMBINDEX, TROPONINI in the last 168 hours.  BNP (last 3 results) No results for input(s): BNP in the last 8760 hours.  ProBNP (last 3 results) No results for input(s): PROBNP in the last 8760 hours.  CBG:  Recent Labs Lab 05/18/15 0109 05/18/15 0154 05/18/15 0435  GLUCAP 66 71 81    Radiological Exams on Admission: Dg Foot 2 Views Left  05/17/2015  CLINICAL DATA:  Chronic left third and fourth toe open wound for 2 months, with pain. Assess for osteomyelitis. Initial encounter. EXAM: LEFT FOOT - 2 VIEW COMPARISON:  None. FINDINGS: There is near complete absence of the third distal phalanx, with vague lucency at the third middle phalanx. This is compatible with osteomyelitis. No definite additional erosions are seen. Soft tissue swelling is noted about the third toe. Visualized joint spaces are otherwise preserved. Small plantar and posterior calcaneal spurs are seen. IMPRESSION: Near  complete absence of the third distal phalanx, with vague lucency at the third middle phalanx. This is compatible with osteomyelitis. Soft tissue swelling about the third toe. Electronically Signed   By: Garald Balding M.D.   On: 05/17/2015 23:00    EKG: Independently reviewed.  Abnormal findings:           Not done in ED, will get one.   Assessment/Plan Principal Problem:   Diabetic infection of left foot ([place]) Active Problems:   Diabetes mellitus without complication (HCC)   HLD (hyperlipidemia)   Essential hypertension   Acute renal failure superimposed on stage 4 chronic kidney disease (HCC)   Osteomyelitis (HCC)   Abdominal pain   Osteomyelitis of left foot (HCC)   Diabetic infection of left foot (HCC) and Osteomyelitis of left foot ([place]): X-ray showed osteomyelitis, no need to do MRI. Patient is not septic on admission. Hemodynamically stable.  - will admit to Med - Empiric antimicrobial treatment with vancomycin and Zosyn  per pharmacy - PRN Zofran for nausea, morphine and Percocet for pain - Blood cultures x 2  - IVF: 1.0 L of NS bolus in ED, followed by 100 - INR/PTT/type & screen - Please consult ortho in AM - LE doppler to r/o DVT -ESR  DM-II: Last A1c not on record. Patient is taking Lantus and Januvia at home -will decrease Lantus dose from 35-20 units twice a day  -SSI -Check A1c  HLD: Last LDL was not on record -Continue home medications: Crestor  -Check FLP  Essential hypertension: -Amlodipine  Abdominal pain: New onset. etiology is not clear -CT abdomen/pelvis without contrast -Check lipase -Morphine for pain  AoCKD-IV: Baseline Cre is 1.9 on 12/31/08, her Cre is 2.49, BUN 41 on admission. Likely due to prerenal secondary to dehydration. - IVF as above - Check FeNa - Follow-up abdominal and pelvis CT scan to rule out hydronephrosis - Follow up renal function by BMP  DVT ppx: give one dose of SQ Heparin tonight. Did not start SCD due to bilateral calf pain since I can not rule out DVT.  Code Status: Full code Family Communication: Yes, patient's granddaughter at bed side Disposition Plan: Admit to inpatient   Date of Service 05/18/2015    Ivor Costa [facility] Pager [phone]  If 7PM-7AM, please contact night-coverage [email or web address] Password [email or web address] 05/18/2015, 5:57 AM

## 2015-05-18 ENCOUNTER — Ambulatory Visit (HOSPITAL_COMMUNITY): Payer: Medicaid Other

## 2015-05-18 ENCOUNTER — Inpatient Hospital Stay (HOSPITAL_COMMUNITY): Payer: Medicaid Other

## 2015-05-18 ENCOUNTER — Encounter (HOSPITAL_COMMUNITY): Payer: Self-pay | Admitting: *Deleted

## 2015-05-18 DIAGNOSIS — E785 Hyperlipidemia, unspecified: Secondary | ICD-10-CM | POA: Diagnosis present

## 2015-05-18 DIAGNOSIS — E11621 Type 2 diabetes mellitus with foot ulcer: Secondary | ICD-10-CM | POA: Diagnosis present

## 2015-05-18 DIAGNOSIS — M79662 Pain in left lower leg: Secondary | ICD-10-CM

## 2015-05-18 DIAGNOSIS — R1031 Right lower quadrant pain: Secondary | ICD-10-CM | POA: Diagnosis present

## 2015-05-18 DIAGNOSIS — E119 Type 2 diabetes mellitus without complications: Secondary | ICD-10-CM

## 2015-05-18 DIAGNOSIS — Z794 Long term (current) use of insulin: Secondary | ICD-10-CM | POA: Diagnosis not present

## 2015-05-18 DIAGNOSIS — N1330 Unspecified hydronephrosis: Secondary | ICD-10-CM | POA: Diagnosis present

## 2015-05-18 DIAGNOSIS — E1169 Type 2 diabetes mellitus with other specified complication: Secondary | ICD-10-CM | POA: Diagnosis present

## 2015-05-18 DIAGNOSIS — M79672 Pain in left foot: Secondary | ICD-10-CM | POA: Diagnosis present

## 2015-05-18 DIAGNOSIS — D62 Acute posthemorrhagic anemia: Secondary | ICD-10-CM | POA: Diagnosis not present

## 2015-05-18 DIAGNOSIS — E1140 Type 2 diabetes mellitus with diabetic neuropathy, unspecified: Secondary | ICD-10-CM | POA: Diagnosis present

## 2015-05-18 DIAGNOSIS — E1122 Type 2 diabetes mellitus with diabetic chronic kidney disease: Secondary | ICD-10-CM | POA: Diagnosis present

## 2015-05-18 DIAGNOSIS — N184 Chronic kidney disease, stage 4 (severe): Secondary | ICD-10-CM | POA: Diagnosis present

## 2015-05-18 DIAGNOSIS — E1165 Type 2 diabetes mellitus with hyperglycemia: Secondary | ICD-10-CM | POA: Diagnosis present

## 2015-05-18 DIAGNOSIS — E860 Dehydration: Secondary | ICD-10-CM | POA: Diagnosis present

## 2015-05-18 DIAGNOSIS — I129 Hypertensive chronic kidney disease with stage 1 through stage 4 chronic kidney disease, or unspecified chronic kidney disease: Secondary | ICD-10-CM | POA: Diagnosis present

## 2015-05-18 DIAGNOSIS — M79673 Pain in unspecified foot: Secondary | ICD-10-CM

## 2015-05-18 DIAGNOSIS — R109 Unspecified abdominal pain: Secondary | ICD-10-CM | POA: Diagnosis present

## 2015-05-18 DIAGNOSIS — E11628 Type 2 diabetes mellitus with other skin complications: Secondary | ICD-10-CM | POA: Diagnosis present

## 2015-05-18 DIAGNOSIS — M869 Osteomyelitis, unspecified: Secondary | ICD-10-CM | POA: Diagnosis present

## 2015-05-18 DIAGNOSIS — L089 Local infection of the skin and subcutaneous tissue, unspecified: Secondary | ICD-10-CM

## 2015-05-18 DIAGNOSIS — M79661 Pain in right lower leg: Secondary | ICD-10-CM

## 2015-05-18 DIAGNOSIS — L02612 Cutaneous abscess of left foot: Secondary | ICD-10-CM | POA: Diagnosis present

## 2015-05-18 DIAGNOSIS — E11649 Type 2 diabetes mellitus with hypoglycemia without coma: Secondary | ICD-10-CM | POA: Diagnosis not present

## 2015-05-18 DIAGNOSIS — R1084 Generalized abdominal pain: Secondary | ICD-10-CM | POA: Diagnosis present

## 2015-05-18 DIAGNOSIS — D631 Anemia in chronic kidney disease: Secondary | ICD-10-CM | POA: Diagnosis present

## 2015-05-18 DIAGNOSIS — N179 Acute kidney failure, unspecified: Secondary | ICD-10-CM | POA: Diagnosis present

## 2015-05-18 DIAGNOSIS — L97524 Non-pressure chronic ulcer of other part of left foot with necrosis of bone: Secondary | ICD-10-CM | POA: Diagnosis present

## 2015-05-18 LAB — BASIC METABOLIC PANEL
Anion gap: 11 (ref 5–15)
BUN: 40 mg/dL — ABNORMAL HIGH (ref 6–20)
CHLORIDE: 103 mmol/L (ref 101–111)
CO2: 24 mmol/L (ref 22–32)
CREATININE: 2.31 mg/dL — AB (ref 0.44–1.00)
Calcium: 9 mg/dL (ref 8.9–10.3)
GFR calc non Af Amer: 21 mL/min — ABNORMAL LOW (ref >60)
GFR, EST AFRICAN AMERICAN: 24 mL/min — AB (ref >60)
Glucose, Bld: 82 mg/dL (ref 65–99)
POTASSIUM: 3.9 mmol/L (ref 3.5–5.1)
SODIUM: 138 mmol/L (ref 135–145)

## 2015-05-18 LAB — GLUCOSE, CAPILLARY
GLUCOSE-CAPILLARY: 71 mg/dL (ref 65–99)
GLUCOSE-CAPILLARY: 68 mg/dL (ref 65–99)
GLUCOSE-CAPILLARY: 131 mg/dL — AB (ref 65–99)
GLUCOSE-CAPILLARY: 236 mg/dL — AB (ref 65–99)
Glucose-Capillary: 66 mg/dL (ref 65–99)
Glucose-Capillary: 81 mg/dL (ref 65–99)
Glucose-Capillary: 94 mg/dL (ref 65–99)
Glucose-Capillary: 157 mg/dL — ABNORMAL HIGH (ref 65–99)

## 2015-05-18 LAB — LIPID PANEL
CHOL/HDL RATIO: 3.5 ratio
Cholesterol: 134 mg/dL (ref 0–200)
HDL: 38 mg/dL — AB (ref >40)
LDL CALC: 64 mg/dL (ref 0–99)
Triglycerides: 160 mg/dL — ABNORMAL HIGH (ref <150)
VLDL: 32 mg/dL (ref 0–40)

## 2015-05-18 LAB — PROTIME-INR
INR: 1.26 (ref 0.00–1.49)
Prothrombin Time: 16 seconds — ABNORMAL HIGH (ref 11.6–15.2)

## 2015-05-18 LAB — TYPE AND SCREEN
ABO/RH(D): B POS
ANTIBODY SCREEN: NEGATIVE

## 2015-05-18 LAB — CBC
HEMATOCRIT: 32.5 % — AB (ref 36.0–46.0)
Hemoglobin: 10.5 g/dL — ABNORMAL LOW (ref 12.0–15.0)
MCH: 28.7 pg (ref 26.0–34.0)
MCHC: 32.3 g/dL (ref 30.0–36.0)
MCV: 88.8 fL (ref 78.0–100.0)
Platelets: 250 10*3/uL (ref 150–400)
RBC: 3.66 MIL/uL — AB (ref 3.87–5.11)
RDW: 13.5 % (ref 11.5–15.5)
WBC: 8 10*3/uL (ref 4.0–10.5)

## 2015-05-18 LAB — ABO/RH: ABO/RH(D): B POS

## 2015-05-18 LAB — APTT: aPTT: 43 seconds — ABNORMAL HIGH (ref 24–37)

## 2015-05-18 LAB — SODIUM, URINE, RANDOM: Sodium, Ur: 77 mmol/L

## 2015-05-18 LAB — LIPASE, BLOOD: LIPASE: 32 U/L (ref 11–51)

## 2015-05-18 LAB — CREATININE, URINE, RANDOM: Creatinine, Urine: 51.52 mg/dL

## 2015-05-18 MED ORDER — PIPERACILLIN-TAZOBACTAM 3.375 G IVPB
3.3750 g | Freq: Three times a day (TID) | INTRAVENOUS | Status: DC
  Administered 2015-05-18 – 2015-05-22 (×13): 3.375 g via INTRAVENOUS
  Filled 2015-05-18 (×15): qty 50

## 2015-05-18 MED ORDER — INSULIN ASPART 100 UNIT/ML ~~LOC~~ SOLN
0.0000 [IU] | SUBCUTANEOUS | Status: DC
  Administered 2015-05-18: 3 [IU] via SUBCUTANEOUS
  Administered 2015-05-18 – 2015-05-19 (×2): 2 [IU] via SUBCUTANEOUS
  Administered 2015-05-19: 1 [IU] via SUBCUTANEOUS
  Administered 2015-05-19: 3 [IU] via SUBCUTANEOUS
  Administered 2015-05-19: 2 [IU] via SUBCUTANEOUS
  Administered 2015-05-19: 5 [IU] via SUBCUTANEOUS
  Administered 2015-05-19 – 2015-05-20 (×2): 2 [IU] via SUBCUTANEOUS
  Administered 2015-05-20: 5 [IU] via SUBCUTANEOUS
  Administered 2015-05-20 (×2): 2 [IU] via SUBCUTANEOUS
  Administered 2015-05-20: 1 [IU] via SUBCUTANEOUS
  Administered 2015-05-20: 2 [IU] via SUBCUTANEOUS
  Administered 2015-05-21 (×2): 1 [IU] via SUBCUTANEOUS
  Administered 2015-05-21: 2 [IU] via SUBCUTANEOUS

## 2015-05-18 MED ORDER — INSULIN GLARGINE 100 UNIT/ML ~~LOC~~ SOLN
20.0000 [IU] | Freq: Two times a day (BID) | SUBCUTANEOUS | Status: DC
  Filled 2015-05-18 (×2): qty 0.2

## 2015-05-18 MED ORDER — AMLODIPINE BESYLATE 5 MG PO TABS
5.0000 mg | ORAL_TABLET | Freq: Every day | ORAL | Status: DC
  Administered 2015-05-18 – 2015-05-23 (×6): 5 mg via ORAL
  Filled 2015-05-18 (×6): qty 1

## 2015-05-18 MED ORDER — INFLUENZA VAC SPLIT QUAD 0.5 ML IM SUSY
0.5000 mL | PREFILLED_SYRINGE | INTRAMUSCULAR | Status: AC
  Administered 2015-05-19: 0.5 mL via INTRAMUSCULAR
  Filled 2015-05-18: qty 0.5

## 2015-05-18 MED ORDER — SODIUM CHLORIDE 0.9 % IV SOLN
INTRAVENOUS | Status: DC
  Administered 2015-05-18 – 2015-05-21 (×8): via INTRAVENOUS

## 2015-05-18 MED ORDER — INSULIN ASPART 100 UNIT/ML ~~LOC~~ SOLN: 0.0000 [IU] | Freq: Three times a day (TID) | SUBCUTANEOUS | Status: DC

## 2015-05-18 MED ORDER — VANCOMYCIN HCL IN DEXTROSE 1-5 GM/200ML-% IV SOLN: 1000.0000 mg | INTRAVENOUS | Status: DC

## 2015-05-18 MED ORDER — ROSUVASTATIN CALCIUM 10 MG PO TABS
10.0000 mg | ORAL_TABLET | Freq: Every day | ORAL | Status: DC
  Administered 2015-05-18 – 2015-05-23 (×6): 10 mg via ORAL
  Filled 2015-05-18 (×6): qty 1

## 2015-05-18 MED ORDER — ACETAMINOPHEN 650 MG RE SUPP: 650.0000 mg | Freq: Four times a day (QID) | RECTAL | Status: DC | PRN

## 2015-05-18 MED ORDER — IOHEXOL 300 MG/ML  SOLN
50.0000 mL | INTRAMUSCULAR | Status: AC
  Administered 2015-05-18: 25 mL via ORAL

## 2015-05-18 MED ORDER — MORPHINE SULFATE (PF) 2 MG/ML IV SOLN
2.0000 mg | INTRAVENOUS | Status: DC | PRN
  Administered 2015-05-19 – 2015-05-22 (×8): 2 mg via INTRAVENOUS
  Filled 2015-05-18 (×8): qty 1

## 2015-05-18 MED ORDER — PARICALCITOL 1 MCG PO CAPS
1.0000 ug | ORAL_CAPSULE | Freq: Every day | ORAL | Status: DC
  Administered 2015-05-18 – 2015-05-23 (×5): 1 ug via ORAL
  Filled 2015-05-18 (×7): qty 1

## 2015-05-18 MED ORDER — DEXTROSE 50 % IV SOLN
INTRAVENOUS | Status: AC
  Administered 2015-05-18: 50 mL
  Filled 2015-05-18: qty 50

## 2015-05-18 MED ORDER — ACETAMINOPHEN 325 MG PO TABS: 650.0000 mg | ORAL_TABLET | Freq: Four times a day (QID) | ORAL | Status: DC | PRN

## 2015-05-18 MED ORDER — OXYCODONE-ACETAMINOPHEN 5-325 MG PO TABS
2.0000 | ORAL_TABLET | Freq: Four times a day (QID) | ORAL | Status: DC | PRN
  Administered 2015-05-20 – 2015-05-23 (×4): 2 via ORAL
  Filled 2015-05-18 (×4): qty 2

## 2015-05-18 MED ORDER — HEPARIN SODIUM (PORCINE) 5000 UNIT/ML IJ SOLN
5000.0000 [IU] | Freq: Once | INTRAMUSCULAR | Status: DC
  Filled 2015-05-18: qty 1

## 2015-05-18 MED ORDER — VANCOMYCIN HCL 10 G IV SOLR
1500.0000 mg | Freq: Once | INTRAVENOUS | Status: AC
  Administered 2015-05-18: 1500 mg via INTRAVENOUS
  Filled 2015-05-18: qty 1500

## 2015-05-18 MED ORDER — ONDANSETRON HCL 4 MG/2ML IJ SOLN
4.0000 mg | Freq: Three times a day (TID) | INTRAMUSCULAR | Status: DC | PRN
  Administered 2015-05-20: 4 mg via INTRAVENOUS
  Filled 2015-05-18 (×2): qty 2

## 2015-05-18 MED ORDER — DEXTROSE 50 % IV SOLN: 25.0000 mL | Freq: Once | INTRAVENOUS | Status: DC

## 2015-05-18 MED ORDER — SODIUM CHLORIDE 0.9 % IV BOLUS (SEPSIS)
1000.0000 mL | Freq: Once | INTRAVENOUS | Status: AC
  Administered 2015-05-18: 1000 mL via INTRAVENOUS

## 2015-05-18 MED ORDER — VANCOMYCIN HCL IN DEXTROSE 750-5 MG/150ML-% IV SOLN
750.0000 mg | INTRAVENOUS | Status: DC
  Administered 2015-05-18 – 2015-05-21 (×4): 750 mg via INTRAVENOUS
  Filled 2015-05-18 (×5): qty 150

## 2015-05-18 MED ORDER — ALUM & MAG HYDROXIDE-SIMETH 200-200-20 MG/5ML PO SUSP: 30.0000 mL | Freq: Four times a day (QID) | ORAL | Status: DC | PRN

## 2015-05-18 NOTE — Progress Notes (Signed)
Interpreter Lesle Chris for OT team,

## 2015-05-18 NOTE — Progress Notes (Signed)
*  PRELIMINARY RESULTS* Vascular Ultrasound Lower extremity venous duplex has been completed.  Preliminary findings: No evidence of DVT or baker's cyst.   Landry Mellow, RDMS, RVT  05/18/2015, 2:28 PM

## 2015-05-18 NOTE — Evaluation (Signed)
Occupational Therapy Evaluation Patient Details Name: Sarah Kelley MRN: [id number] DOB: 02/20/1951 Today's Date: 05/18/2015    History of Present Illness 64 yo female admitted with osteomyelitis with infection 3rd toe L foot. PMH: HTN, Hyperlipidemia, DM, CKD IV   Clinical Impression   Pt admitted to hospital due to reason stated above. Pt currently with functional limitiations due to the deficits listed below (see OT problem list). Therapist used Financial trader of [place], to translate pt care. Prior to admission pt required assistance for LB dressing (donning bil LE shoes), however did not require any assistance with UB dressing, bathing or toileting. Pt currently requires supervision to total assistance for ADL completion. Pt reports Lt foot pain, weakness and numbness that radiates up to her groin region. Pt will benefit from skilled OT to increase her independence and safety with ADLs and balance to allow discharge to venue listed below.    Follow Up Recommendations  SNF    Equipment Recommendations  Other (comment) (TBD next venue)    Recommendations for Other Services       Precautions / Restrictions Precautions Precautions: Fall Restrictions Weight Bearing Restrictions: No      Mobility Bed Mobility Overal bed mobility: Needs Assistance Bed Mobility: Supine to Sit     Supine to sit: Min guard     General bed mobility comments: HOB elevated, required assist to bring hip forward to EOB and need increase time to complete  Transfers Overall transfer level: Needs assistance Equipment used: Rolling walker (2 wheeled) Transfers: Sit to/from Stand Sit to Stand: +2 safety/equipment;Min assist         General transfer comment: min A to power up from bed, required verbal cues for safety and hand placement    Balance Overall balance assessment: Needs assistance Sitting-balance support: No upper extremity supported;Feet supported Sitting  balance-Leahy Scale: Fair     Standing balance support: No upper extremity supported;During functional activity Standing balance-Leahy Scale: Fair Standing balance comment: Pt able to stand at sink to perform grooming activities without UE support                            ADL Overall ADL's : Needs assistance/impaired     Grooming: Wash/dry face;Oral care;Supervision/safety;Standing               Lower Body Dressing: Total assistance;Sitting/lateral leans Lower Body Dressing Details (indicate cue type and reason): total assist to don bil LE shoes             Functional mobility during ADLs: Min guard;Rolling walker       Vision Additional Comments: Pt Rt eye demonstrates esotropria, pt unable to see out of Rt eye   Perception     Praxis      Pertinent Vitals/Pain Pain Assessment: 0-10 Pain Score: 10-Worst pain ever Pain Location: Lt toe and R heel Pain Descriptors / Indicators: Numbness;Radiating;Sharp Pain Intervention(s): Monitored during session;Repositioned     Hand Dominance Right   Extremity/Trunk Assessment Upper Extremity Assessment Upper Extremity Assessment: Generalized weakness (Bil hand numbness)   Lower Extremity Assessment Lower Extremity Assessment: Defer to PT evaluation RLE Deficits / Details: generalized weakness, R heel pain RLE Sensation: decreased light touch LLE Deficits / Details: generalized weakness, numbness up to groin LLE Sensation: decreased light touch   Cervical / Trunk Assessment Cervical / Trunk Assessment: Normal   Communication Communication Communication: Prefers language other than English (used Patent attorney)   Cognition Arousal/Alertness: Awake/alert  Behavior During Therapy: WFL for tasks assessed/performed Overall Cognitive Status: Within Functional Limits for tasks assessed                     General Comments    [name] present during session. Used spanish interpreter  Sarah Kelley to assist with translating during session.    Exercises       Shoulder Instructions      Home Living Family/patient expects to be discharged to:: Private residence Living Arrangements: Spouse/significant other;Children Available Help at Discharge: Family;Available 24 hours/day Type of Home: Mobile home Home Access: Ramped entrance     Home Layout: One level     Bathroom Shower/Tub: Occupational psychologist: Standard     Home Equipment: Environmental consultant - 2 wheels;Cane - single point          Prior Functioning/Environment Level of Independence: Needs assistance  Gait / Transfers Assistance Needed: used cane ADL's / Homemaking Assistance Needed: assist for LB dressing of donning Bil LE shoes        OT Diagnosis: Generalized weakness;Acute pain   OT Problem List: Decreased strength;Decreased activity tolerance;Impaired balance (sitting and/or standing);Decreased knowledge of use of DME or AE;Pain   OT Treatment/Interventions: Self-care/ADL training;Therapeutic exercise;DME and/or AE instruction;Therapeutic activities;Patient/family education;Balance training    OT Goals(Current goals can be found in the care plan section) Acute Rehab OT Goals Patient Stated Goal: none stated OT Goal Formulation: With patient Time For Goal Achievement: 06/01/15 Potential to Achieve Goals: Good ADL Goals Pt Will Perform Grooming: with modified independence;standing (locate and gather items ) Pt Will Perform Lower Body Bathing: with modified independence;with adaptive equipment;sit to/from stand Pt Will Perform Lower Body Dressing: with modified independence;with adaptive equipment;sit to/from stand Pt Will Transfer to Toilet: with supervision;ambulating;regular height toilet Pt Will Perform Toileting - Clothing Manipulation and hygiene: with modified independence;sit to/from stand Pt Will Perform Tub/Shower Transfer: Shower transfer;with modified independence;ambulating;rolling  walker;3 in 1  OT Frequency: Min 2X/week   Barriers to D/C:            Co-evaluation PT/OT/SLP Co-Evaluation/Treatment: Yes Reason for Co-Treatment: For patient/therapist safety PT goals addressed during session: Mobility/safety with mobility OT goals addressed during session: ADL's and self-care      End of Session Equipment Utilized During Treatment: Gait belt;Rolling walker  Activity Tolerance: Patient tolerated treatment well Patient left: in chair;with call bell/phone within reach;with family/visitor present   Time: [id number] OT Time Calculation (min): 36 min Charges:  OT General Charges $OT Visit: 1 Procedure OT Evaluation $Initial OT Evaluation Tier I: 1 Procedure G-Codes:    Lin Landsman 2015-06-16, 2:46 PM

## 2015-05-18 NOTE — Evaluation (Signed)
Physical Therapy Evaluation Patient Details Name: Sarah Kelley MRN: [id number] DOB: 1951/03/23 Today's Date: 05/18/2015   History of Present Illness  64 yo female admitted with osteomyelitis with infection 3rd toe L foot. PMH: HTN, Hyperlipidemia, DM, CKD IV  Clinical Impression  Pt presenting with L toe infection and awaiting decision on if patient will need surgery. Pt currently functioning at min/modA level with limited ambulation tolerance due to bilat foot pain L>R. Pt at this time has support and would be able to return home with assist and using RW for ambulation, however will re-assess once medical plan determined.    Follow Up Recommendations No PT follow up;Supervision/Assistance - 24 hour (will re-evaluate after medical plan determined, if patient has surgery or not)    Equipment Recommendations  None recommended by PT    Recommendations for Other Services       Precautions / Restrictions Precautions Precautions: Fall Required Braces or Orthoses:  (post op shoe on L LE) Restrictions Weight Bearing Restrictions: No      Mobility  Bed Mobility Overal bed mobility: Needs Assistance Bed Mobility: Supine to Sit     Supine to sit: Min guard     General bed mobility comments: hob elevated, assist to bring hips to EOB, increased time  Transfers Overall transfer level: Needs assistance Equipment used: Rolling walker (2 wheeled) Transfers: Sit to/from Stand Sit to Stand: Mod assist;+2 safety/equipment         General transfer comment: min A to power up from bed, required verbal cues for safety and hand placement  Ambulation/Gait Ambulation/Gait assistance: Min assist;+2 safety/equipment Ambulation Distance (Feet): 10 Feet Assistive device: Rolling walker (2 wheeled) Gait Pattern/deviations: Step-to pattern;Antalgic Gait velocity: decreased   General Gait Details: decreased step length, increased bilat LE pain  Stairs            Wheelchair  Mobility    Modified Rankin (Stroke Patients Only)       Balance Overall balance assessment: Needs assistance Sitting-balance support: No upper extremity supported Sitting balance-Leahy Scale: Fair     Standing balance support: No upper extremity supported;During functional activity Standing balance-Leahy Scale: Fair Standing balance comment: pt able to stand at sink and brush teeth                             Pertinent Vitals/Pain Pain Assessment: 0-10 Pain Score: 10-Worst pain ever Pain Location: Lt toe and R heel Pain Descriptors / Indicators: Numbness;Sharp Pain Intervention(s): Monitored during session    Home Living Family/patient expects to be discharged to:: Private residence Living Arrangements: Spouse/significant other;Children Available Help at Discharge: Family;Available 24 hours/day Type of Home:  (trailer) Home Access: Ramped entrance     Home Layout: One level Home Equipment: Walker - 2 wheels;Cane - single point      Prior Function Level of Independence: Needs assistance   Gait / Transfers Assistance Needed: used cane  ADL's / Homemaking Assistance Needed: assist for LB dressing        Hand Dominance   Dominant Hand: Right    Extremity/Trunk Assessment   Upper Extremity Assessment: Generalized weakness (bilat hand numbness)           Lower Extremity Assessment: RLE deficits/detail;LLE deficits/detail RLE Deficits / Details: generalized weakness, R heel pain LLE Deficits / Details: generalized weakness, numbness up to groin  Cervical / Trunk Assessment: Normal  Communication   Communication: Prefers language other than English (used Patent attorney)  Associate Professor  Arousal/Alertness: Awake/alert Behavior During Therapy: WFL for tasks assessed/performed Overall Cognitive Status: Within Functional Limits for tasks assessed                      General Comments General comments (skin integrity, edema, etc.): Pt's  son-in-law present during session. Used spanish interpreter Spero Geralds to assist with translating during session.    Exercises        Assessment/Plan    PT Assessment Patient needs continued PT services  PT Diagnosis Difficulty walking   PT Problem List Decreased strength;Decreased activity tolerance;Decreased balance;Decreased mobility  PT Treatment Interventions DME instruction;Gait training;Functional mobility training;Therapeutic activities;Therapeutic exercise   PT Goals (Current goals can be found in the Care Plan section) Acute Rehab PT Goals Patient Stated Goal: none stated PT Goal Formulation: With patient/family Time For Goal Achievement: 06/01/15 Potential to Achieve Goals: Good    Frequency Min 2X/week   Barriers to discharge        Co-evaluation   Reason for Co-Treatment: For patient/therapist safety PT goals addressed during session: Mobility/safety with mobility OT goals addressed during session: ADL's and self-care       End of Session Equipment Utilized During Treatment: Gait belt Activity Tolerance: Patient tolerated treatment well Patient left: in bed;with call bell/phone within reach;with nursing/sitter in room (pt was left in chair and then transportation arrived and then PT transferred pt back to bed  Nurse Communication: Mobility status         Time: 1320-1406 PT Time Calculation (min) (ACUTE ONLY): 46 min   Charges:   PT Evaluation $Initial PT Evaluation Tier I: 1 Procedure PT Treatments $Therapeutic Activity: 8-22 mins   PT G CodesKingsley Callander 05/18/2015, 3:41 PM   Kittie Plater, PT, DPT Pager #: [phone] Office #: [phone]

## 2015-05-18 NOTE — Progress Notes (Signed)
Foley cath placed per MD order for hydronephrosis. Inserted without difficulty. Approximately 900cc returned. Patient tolerated well.

## 2015-05-18 NOTE — Progress Notes (Signed)
*  PRELIMINARY RESULTS* Vascular Ultrasound Lower Extremity Arterial Duplex has been completed.  Preliminary findings: Monophasic flow noted throughout tibial vessels, and no clear flow visualized in right PTA, suggestive of tibial disease.  ABI not performed- patient unable to tolerate cuffs on ankles due to pain.   Landry Mellow, RDMS, RVT  05/18/2015, 3:08 PM

## 2015-05-18 NOTE — Progress Notes (Signed)
[facility] PROGRESS NOTE  Sarah Kelley OXB:[id number] DOB: 1951-06-13 DOA: 05/17/2015 PCP: No primary care provider on file.  Assessment/Plan: Diabetic infection of left foot ([place]) and Osteomyelitis of left foot ([place]): X-ray showed osteomyelitis, no need to do MRI.  - Continue with  with vancomycin and Zosyn per pharmacy - Blood cultures x 2  - LE doppler to r/o DVT -ESR -ortho consulted. ABI ordered.   DM-II: Last A1c not on record. Patient is taking Lantus and Januvia at home -hold lantus, patient has been NPO and had episode of hypoglycemia this am.  -SSI -Check A1c  HLD: Last LDL was not on record -Continue home medications: Crestor  -Check FLP  Essential hypertension: -Amlodipine  Abdominal pain: New onset. etiology is not clear -CT abdomen/pelvis without contrast negative only showed hydronephrosis.  -Morphine for pain -lipase 32.   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 Likely due to prerenal secondary to dehydration. - IVF as above - CT with hydronephrosis. Will check bladder scan, foley catheter if retention.  - Follow up renal function by BMP  Code Status: Full code.  Family Communication: care discussed with patient.  Disposition Plan: Remain inpatient for treatment of foot infection and renal failure./    Consultants:  ortho  Procedures: ABI  Antibiotics:  Vancomycin 11-15  Zosyn 11-15  HPI/Subjective: Relates 3 toe pain for last 2 months. Pain is better today./   Objective: Filed Vitals:   05/18/15 0838  BP: 145/54  Pulse: 79  Temp: 99.4 F (37.4 C)  Resp: 16    Intake/Output Summary (Last 24 hours) at 05/18/15 0913 Last data filed at 05/18/15 0839  Gross per 24 hour  Intake    290 ml  Output    950 ml  Net   -660 ml   Filed Weights   05/18/15 0112  Weight: 71.351 kg (157 lb 4.8 oz)    Exam:   General:  Alert in no acute distress.   Cardiovascular: S 1, S 2  RRR  Respiratory: CTA  Abdomen: bs present, soft, nt  Musculoskeletal: BL extremity with hyperpigmentation,. 3 left toe with black discoloration redness  Data Reviewed: Basic Metabolic Panel:  Recent Labs Lab 05/17/15 1754 05/18/15 0150  NA 141 138  K 4.1 3.9  CL 107 103  CO2 23 24  GLUCOSE 99 82  BUN 41* 40*  CREATININE 2.49* 2.31*  CALCIUM 9.4 9.0   Liver Function Tests:  Recent Labs Lab 05/17/15 1754  AST 26  ALT 16  ALKPHOS 90  BILITOT 0.4  PROT 7.5  ALBUMIN 3.4*    Recent Labs Lab 05/18/15 0150  LIPASE 32   No results for input(s): AMMONIA in the last 168 hours. CBC:  Recent Labs Lab 05/17/15 1754 05/18/15 0150  WBC 7.5 8.0  HGB 11.0* 10.5*  HCT 34.2* 32.5*  MCV 87.7 88.8  PLT 249 250   Cardiac Enzymes: No results for input(s): CKTOTAL, CKMB, CKMBINDEX, TROPONINI in the last 168 hours. BNP (last 3 results) No results for input(s): BNP in the last 8760 hours.  ProBNP (last 3 results) No results for input(s): PROBNP in the last 8760 hours.  CBG:  Recent Labs Lab 05/18/15 0109 05/18/15 0154 05/18/15 0435 05/18/15 0834  GLUCAP 66 71 81 68    No results found for this or any previous visit (from the past 240 hour(s)).   Studies: Ct Abdomen Pelvis Wo Contrast  05/18/2015  CLINICAL DATA:  New onset  abdominal pain EXAM: CT ABDOMEN AND PELVIS WITHOUT CONTRAST TECHNIQUE: Multidetector CT imaging of the abdomen and pelvis was performed following the standard protocol without IV contrast. COMPARISON:  None. FINDINGS: Lung bases are free of acute infiltrate or sizable effusion. A calcified granuloma is noted in the right lung base. The gallbladder, liver, spleen, adrenal glands and pancreas are within normal limits. The kidneys show mild fullness of the collecting systems bilaterally. This is likely related to the distended bladder as no definitive ureteral calculi are seen. The bladder is over distended. No filling defect is noted. Small amount  of air is noted within the bladder which may be related to recent instrumentation. Clinical correlation is recommended. Diffuse vascular calcifications are noted throughout the uterus. No pelvic mass lesion is noted. No free pelvic fluid is seen. The appendix is not well visualized. No inflammatory changes are identified. Aortoiliac calcifications are noted without aneurysmal dilatation. The osseous structures show no acute abnormality. IMPRESSION: No acute abnormality noted. The bladder is distended with associated mild hydronephrosis bilaterally. Electronically Signed   By: Inez Catalina M.D.   On: 05/18/2015 07:41   Dg Foot 2 Views Left  05/17/2015  CLINICAL DATA:  Chronic left third and fourth toe open wound for 2 months, with pain. Assess for osteomyelitis. Initial encounter. EXAM: LEFT FOOT - 2 VIEW COMPARISON:  None. FINDINGS: There is near complete absence of the third distal phalanx, with vague lucency at the third middle phalanx. This is compatible with osteomyelitis. No definite additional erosions are seen. Soft tissue swelling is noted about the third toe. Visualized joint spaces are otherwise preserved. Small plantar and posterior calcaneal spurs are seen. IMPRESSION: Near complete absence of the third distal phalanx, with vague lucency at the third middle phalanx. This is compatible with osteomyelitis. Soft tissue swelling about the third toe. Electronically Signed   By: Garald Balding M.D.   On: 05/17/2015 23:00    Scheduled Meds: . amLODipine  5 mg Oral Daily  . dextrose  25 mL Intravenous Once  . heparin  5,000 Units Subcutaneous Once  . [START ON 05/19/2015] Influenza vac split quadrivalent PF  0.5 mL Intramuscular Tomorrow-1000  . insulin aspart  0-9 Units Subcutaneous 6 times per day  . insulin glargine  20 Units Subcutaneous BID  . paricalcitol  1 mcg Oral Daily  . piperacillin-tazobactam (ZOSYN)  IV  3.375 g Intravenous Q8H  . rosuvastatin  10 mg Oral Daily  . [START ON  05/20/2015] vancomycin  1,000 mg Intravenous Q48H   Continuous Infusions:   Principal Problem:   Diabetic infection of left foot (HCC) Active Problems:   Diabetes mellitus without complication (HCC)   HLD (hyperlipidemia)   Essential hypertension   Acute renal failure superimposed on stage 4 chronic kidney disease (HCC)   Osteomyelitis (HCC)   Abdominal pain   Osteomyelitis of left foot ([place])    Time spent: 35 minutes.     Niel Hummer A  [facility] Pager [phone]. If 7PM-7AM, please contact night-coverage at [email or web address], password [facility] 05/18/2015, 9:13 AM  LOS: 0 days

## 2015-05-18 NOTE — Progress Notes (Signed)
ANTIBIOTIC CONSULT NOTE - INITIAL  Pharmacy Consult for Vancomycin and Zosyn  Indication: cellulitis  No Known Allergies  Patient Measurements: Weight: 157 lb 4.8 oz (71.351 kg)  Vital Signs: Temp: 97.9 F (36.6 C) (11/15 0112) Temp Source: Oral (11/15 0112) BP: 125/72 mmHg (11/15 0112) Pulse Rate: 75 (11/15 0112) Intake/Output from previous day: 11/14 0701 - 11/15 0700 In: -  Out: 150 [Urine:150] Intake/Output from this shift: Total I/O In: -  Out: 150 [Urine:150]  Labs:  Recent Labs  05/17/15 1754  WBC 7.5  HGB 11.0*  PLT 249  CREATININE 2.49*   CrCl cannot be calculated (Unknown ideal weight.). No results for input(s): VANCOTROUGH, VANCOPEAK, VANCORANDOM, GENTTROUGH, GENTPEAK, GENTRANDOM, TOBRATROUGH, TOBRAPEAK, TOBRARND, AMIKACINPEAK, AMIKACINTROU, AMIKACIN in the last 72 hours.   Microbiology: No results found for this or any previous visit (from the past 720 hour(s)).  Medical History: Past Medical History  Diagnosis Date  . Diabetes mellitus   . Hypertension   . Hyperlipidemia   . Renal disorder   . Thyroid disease     Medications:  Prescriptions prior to admission  Medication Sig Dispense Refill Last Dose  . acetaminophen (TYLENOL) 325 MG tablet Take 650 mg by mouth every 6 (six) hours as needed for mild pain.   2 weeks  . amLODipine (NORVASC) 5 MG tablet Take 5 mg by mouth daily.   05/17/2015 at Unknown time  . clindamycin (CLEOCIN) 150 MG capsule Take 300 mg by mouth 2 (two) times daily.   05/17/2015 at Unknown time  . insulin glargine (LANTUS) 100 UNIT/ML injection Inject 35 Units into the skin 2 (two) times daily.   05/17/2015 at Unknown time  . paricalcitol (ZEMPLAR) 1 MCG capsule Take 1 mcg by mouth daily.   05/17/2015 at Unknown time  . rosuvastatin (CRESTOR) 10 MG tablet Take 10 mg by mouth daily.   05/17/2015 at Unknown time  . sitaGLIPtin (JANUVIA) 50 MG tablet Take 50 mg by mouth daily.   05/17/2015 at Unknown time   Assessment: 64  y.o. female with L foot infection for empiric antibiotics  Goal of Therapy:  Vancomycin trough level 10-15 mcg/ml  Plan:  Vancomycin 1500 mg IV now, then vancomycin 1 g IV q48h  Zosyn 3.375 g IV q8h   Caryl Pina 05/18/2015,1:24 AM

## 2015-05-18 NOTE — Progress Notes (Signed)
Utilization review completed. Mesa Janus, RN, BSN. 

## 2015-05-18 NOTE — Progress Notes (Signed)
Sarah, DARITY ([id number]) Visit Report for 05/17/2015 Allergy List Details Kelley, 05/17/2015 2:15 Patient Name: Date of Service: Sarah Kelley PM Medical Record Patient Account Number: [id number] Number: Treating RN: Montey Hora Date of Birth/Sex: 05/08/1951 (64 y.o. Female) Other Clinician: Primary Care Treating Britto, [place] Physician: Physician/Extender: Referring Physician: Garner Nash in Treatment: 0 Allergies Active Allergies No Known Drug Allergies Allergy Notes Electronic Signature(s) Signed: 05/17/2015 5:00:47 PM By: Montey Hora Entered By: Montey Hora on 05/17/2015 14:24:03 [name] ([id number]) -------------------------------------------------------------------------------- Arrival Information Details Kelley, 05/17/2015 2:15 Patient Name: Date of Service: Sarah Kelley PM Medical Record Patient Account Number: [id number] Number: Treating RN: Montey Hora Date of Birth/Sex: [birth year] (64 y.o. Female) Other Clinician: Primary Care Treating Britto, [place] Physician: Physician/Extender: Referring Physician: Garner Nash in Treatment: 0 Visit Information Patient Arrived: Cane Arrival Time: 14:19 Accompanied By: son Transfer Assistance: None Patient Identification Verified: Yes Secondary Verification Process Completed: Yes Patient Has Alerts: Yes Patient Alerts: DMII Electronic Signature(s) Signed: 05/17/2015 5:00:47 PM By: Montey Hora Entered By: Montey Hora on 05/17/2015 14:20:24 [name] ([id number]) -------------------------------------------------------------------------------- Clinic Level of Care Assessment Details Kelley, 05/17/2015 2:15 Patient Name: Date of Service: Sarah Kelley PM Medical Record Patient Account Number: [id number] Number: Treating RN: Montey Hora Date of Birth/Sex: 24-Jan-1951 (64 y.o. Female) Other  Clinician: Primary Care Treating Britto, [place] Physician: Physician/Extender: Referring Physician: Garner Nash in Treatment: 0 Clinic Level of Care Assessment Items TOOL 2 Quantity Score []  - Use when only an EandM is performed on the INITIAL visit 0 ASSESSMENTS - Nursing Assessment / Reassessment X - General Physical Exam (combine w/ comprehensive assessment (listed just 1 20 below) when performed on new pt. evals) X - Comprehensive Assessment (HX, ROS, Risk Assessments, Wounds Hx, etc.) 1 25 ASSESSMENTS - Wound and Skin Assessment / Reassessment X - Simple Wound Assessment / Reassessment - one wound 1 5 []  - Complex Wound Assessment / Reassessment - multiple wounds 0 []  - Dermatologic / Skin Assessment (not related to wound area) 0 ASSESSMENTS - Ostomy and/or Continence Assessment and Care []  - Incontinence Assessment and Management 0 []  - Ostomy Care Assessment and Management (repouching, etc.) 0 PROCESS - Coordination of Care X - Simple Patient / Family Education for ongoing care 1 15 []  - Complex (extensive) Patient / Family Education for ongoing care 0 X - Staff obtains Consents, Records, Test Results / Process Orders 1 10 []  - Staff telephones HHA, Nursing Homes / Clarify orders / etc 0 []  - Routine Transfer to another Facility (non-emergent condition) 0 []  - Routine Hospital Admission (non-emergent condition) 0 X - New Admissions / Biomedical engineer / Ordering NPWT, Apligraf, etc. 1 15 Kelley, Sarah Kelley ([id number]) []  - Emergency Hospital Admission (emergent condition) 0 X - Simple Discharge Coordination 1 10 []  - Complex (extensive) Discharge Coordination 0 PROCESS - Special Needs []  - Pediatric / Minor Patient Management 0 []  - Isolation Patient Management 0 []  - Hearing / Language / Visual special needs 0 []  - Assessment of Community assistance (transportation, D/C planning, etc.) 0 []  - Additional assistance / Altered mentation 0 []  - Support  Surface(s) Assessment (bed, cushion, seat, etc.) 0 INTERVENTIONS - Wound Cleansing / Measurement X - Wound Imaging (photographs - any number of wounds) 1 5 []  - Wound Tracing (instead of photographs) 0 X - Simple Wound Measurement - one wound 1 5 []  - Complex Wound Measurement - multiple wounds 0 X - Simple Wound Cleansing - one wound 1 5 []  - Complex Wound Cleansing - multiple  wounds 0 INTERVENTIONS - Wound Dressings X - Small Wound Dressing one or multiple wounds 1 10 []  - Medium Wound Dressing one or multiple wounds 0 []  - Large Wound Dressing one or multiple wounds 0 []  - Application of Medications - injection 0 INTERVENTIONS - Miscellaneous []  - External ear exam 0 []  - Specimen Collection (cultures, biopsies, blood, body fluids, etc.) 0 []  - Specimen(s) / Culture(s) sent or taken to Lab for analysis 0 []  - Patient Transfer (multiple staff / Harrel Lemon Lift / Similar devices) 0 Kelley, Sarah Kelley ([id number]) []  - Simple Staple / Suture removal (25 or less) 0 []  - Complex Staple / Suture removal (26 or more) 0 []  - Hypo / Hyperglycemic Management (close monitor of Blood Glucose) 0 X - Ankle / Brachial Index (ABI) - do not check if billed separately 1 15 Has the patient been seen at the hospital within the last three years: Yes Total Score: 140 Level Of Care: New/Established - Level 4 Electronic Signature(s) Signed: 05/17/2015 5:00:47 PM By: Montey Hora Entered By: Montey Hora on 05/17/2015 15:05:36 [name] ([id number]) -------------------------------------------------------------------------------- Encounter Discharge Information Details Kelley, 05/17/2015 2:15 Patient Name: Date of Service: [name] PM Medical Record Patient Account Number: [id number] Number: Treating RN: Montey Hora Date of Birth/Sex: 10/11/1950 (64 y.o. Female) Other Clinician: Primary Care Treating Britto, [place] Physician: Physician/Extender: Referring  Physician: Garner Nash in Treatment: 0 Encounter Discharge Information Items Discharge Pain Level: 0 Discharge Condition: Stable Ambulatory Status: [place] Emergency Discharge Destination: Room Transportation: Private Auto Accompanied By: son Schedule Follow-up Appointment: No Medication Reconciliation completed and provided to Patient/Care No Riane Rung: Provided on Clinical Summary of Care: 05/17/2015 Form Type Recipient Paper Patient RGG Electronic Signature(s) Signed: 05/17/2015 3:23:24 PM By: Ruthine Dose Entered By: Ruthine Dose on 05/17/2015 15:23:24 [name] ([id number]) -------------------------------------------------------------------------------- Lower Extremity Assessment Details Kelley, 05/17/2015 2:15 Patient Name: Date of Service: [place] PM Medical Record Patient Account Number: [id number] Number: Treating RN: Montey Hora Date of Birth/Sex: 13-May-1951 (64 y.o. Female) Other Clinician: Primary Care Treating Britto, [place] Physician: Physician/Extender: Referring Physician: Garner Nash in Treatment: 0 Edema Assessment Assessed: [Left: No] [Right: No] Edema: [Left: Yes] [Right: No] Calf Left: Right: Point of Measurement: 28 cm From Medial Instep 35.3 cm 32.8 cm Ankle Left: Right: Point of Measurement: 8 cm From Medial Instep 22.5 cm 20.5 cm Vascular Assessment Pulses: Posterior Tibial Palpable: [Left:Yes] [Right:Yes] Doppler: [Left:Monophasic] [Right:Monophasic] Dorsalis Pedis Palpable: [Left:Yes] [Right:Yes] Doppler: [Left:Monophasic] [Right:Monophasic] Extremity colors, hair growth, and conditions: Extremity Color: [Left:Hyperpigmented] [Right:Hyperpigmented] Hair Growth on Extremity: [Left:No] [Right:No] Temperature of Extremity: [Left:Warm] [Right:Warm] Capillary Refill: [Left:< 3 seconds] [Right:< 3 seconds] Blood Pressure: Brachial: [Left:112] [Right:114] Dorsalis Pedis: 122  [Left:Dorsalis Pedis:] Ankle: Posterior Tibial: [Left:Posterior Tibial: 1.07] Toe Nail Assessment Left: Right: Thick: Yes Yes Kelley, Arleene ([id number]) Discolored: Yes Yes Deformed: No No Improper Length and Hygiene: No No Electronic Signature(s) Signed: 05/17/2015 5:00:47 PM By: Montey Hora Entered By: Montey Hora on 05/17/2015 14:51:49 Kelley, Sarah Kelley ([id number]) -------------------------------------------------------------------------------- Multi Wound Chart Details Kelley, 05/17/2015 2:15 Patient Name: Date of Service: Nikkita PM Medical Record Patient Account Number: [id number] Number: Treating RN: Montey Hora Date of Birth/Sex: 1951-04-23 (64 y.o. Female) Other Clinician: Primary Care Treating Britto, [place] Physician: Physician/Extender: Referring Physician: Garner Nash in Treatment: 0 Vital Signs Height(in): Pulse(bpm): 70 Weight(lbs): Blood Pressure 117/51 (mmHg): Body Mass Index(BMI): Temperature(F): 98.4 Respiratory Rate 18 (breaths/min): Photos: [1:No Photos] [N/A:N/A] Wound Location: [1:Left Toe Third] [N/A:N/A] Wounding Event: [1:Blister] [N/A:N/A] Primary Etiology: [1:Diabetic Wound/Ulcer of the Lower Extremity] [  N/A:N/A] Comorbid History: [1:Hypertension, Type II Diabetes, Neuropathy] [N/A:N/A] Date Acquired: [1:04/05/2015] [N/A:N/A] Weeks of Treatment: [1:0] [N/A:N/A] Wound Status: [1:Open] [N/A:N/A] Pending Amputation on Yes [N/A:N/A] Presentation: Measurements L x W x D 0.7x1.3x0.1 [N/A:N/A] (cm) Area (cm) : [1:0.715] [N/A:N/A] Volume (cm) : [1:0.071] [N/A:N/A] Classification: [1:Grade 3] [N/A:N/A] Wagner Verification: [1:X-Ray] [N/A:N/A] Exudate Amount: [1:Small] [N/A:N/A] Exudate Type: [1:Serous] [N/A:N/A] Exudate Color: [1:amber] [N/A:N/A] Foul Odor After [1:Yes] [N/A:N/A] Cleansing: Odor Anticipated Due to No [N/A:N/A] Product Use: Wound Margin: [1:Flat and Intact]  [N/A:N/A] Granulation Amount: [1:None Present (0%)] [N/A:N/A] Necrotic Amount: Large (67-100%) N/A N/A Necrotic Tissue: Eschar, Adherent Slough N/A N/A Exposed Structures: Fascia: No N/A N/A Fat: No Tendon: No Muscle: No Joint: No Bone: No Limited to Skin Breakdown Epithelialization: None N/A N/A Periwound Skin Texture: Edema: Yes N/A N/A Excoriation: No Induration: No Callus: No Crepitus: No Fluctuance: No Friable: No Rash: No Scarring: No Periwound Skin Maceration: No N/A N/A Moisture: Moist: No Dry/Scaly: No Periwound Skin Color: Erythema: Yes N/A N/A Atrophie Blanche: No Cyanosis: No Ecchymosis: No Hemosiderin Staining: No Mottled: No Pallor: No Rubor: No Erythema Location: Circumferential N/A N/A Temperature: Hot N/A N/A Tenderness on Yes N/A N/A Palpation: Wound Preparation: Ulcer Cleansing: N/A N/A Rinsed/Irrigated with Saline Topical Anesthetic Applied: Other: lidocaine 4% Treatment Notes Electronic Signature(s) Signed: 05/17/2015 5:00:47 PM By: Montey Hora Entered By: Montey Hora on 05/17/2015 15:00:08 TANEAH, ELBE ([id number]) Sarah Kelley, Sarah Kelley ([id number]) -------------------------------------------------------------------------------- [place] Details Kelley, 05/17/2015 2:15 Patient Name: Date of Service: Sarah Kelley PM Medical Record Patient Account Number: [id number] Number: Treating RN: Montey Hora Date of Birth/Sex: [birth year] (64 y.o. Female) Other Clinician: Primary Care Treating Britto, [place] Physician: Physician/Extender: Referring Physician: Garner Nash in Treatment: 0 Active Inactive Electronic Signature(s) Signed: 05/17/2015 5:00:47 PM By: Montey Hora Entered By: Montey Hora on 05/17/2015 15:22:34 [name] ([id number]) -------------------------------------------------------------------------------- Patient/Caregiver Education  Details Kelley, 05/17/2015 2:15 Patient Name: Date of Service: Aijah PM Medical Record Patient Account Number: [id number] Number: Treating RN: Montey Hora Date of Birth/Gender: 04-17-1951 (64 y.o. Female) Other Clinician: Primary Care Treating Britto, [place] Physician: Physician/Extender: Referring Physician: Garner Nash in Treatment: 0 Education Assessment Education Provided To: Patient and Caregiver Education Topics Provided Wound/Skin Impairment: Handouts: Other: go to ED for foo tinfection Methods: Explain/Verbal Responses: State content correctly Electronic Signature(s) Signed: 05/17/2015 5:00:47 PM By: Montey Hora Entered By: Montey Hora on 05/17/2015 15:22:22 [name] ([id number]) -------------------------------------------------------------------------------- Wound Assessment Details Kelley, 05/17/2015 2:15 Patient Name: Date of Service: Sarah Kelley PM Medical Record Patient Account Number: [id number] Number: Treating RN: Montey Hora Date of Birth/Sex: June 05, 1951 (64 y.o. Female) Other Clinician: Primary Care Treating Britto, [place] Physician: Physician/Extender: Referring Physician: Garner Nash in Treatment: 0 Wound Status Wound Number: 1 Primary Diabetic Wound/Ulcer of the Lower Etiology: Extremity Wound Location: Left Toe Third Wound Status: Open Wounding Event: Blister Comorbid Hypertension, Type II Diabetes, Date Acquired: 04/05/2015 History: Neuropathy Weeks Of Treatment: 0 Clustered Wound: No Pending Amputation On Presentation Photos Photo Uploaded By: Montey Hora on 05/17/2015 16:37:34 Wound Measurements Length: (cm) 0.7 Width: (cm) 1.3 Depth: (cm) 0.1 Area: (cm) 0.715 Volume: (cm) 0.071 % Reduction in Area: % Reduction in Volume: Epithelialization: None Tunneling: No Undermining: No Wound Description Classification: Grade 3 Foul Odor Aft Wagner  Verification: X-Ray Due to Produc Wound Margin: Flat and Intact Exudate Amount: Small Exudate Type: Serous Exudate Color: amber er Cleansing: Yes t Use: No Wound Bed Kelley, Sarah Kelley ([id number]) Granulation Amount: None Present (0%) Exposed Structure Necrotic Amount: Large (67-100%) Fascia Exposed: No  Necrotic Quality: Eschar, Adherent Slough Fat Layer Exposed: No Tendon Exposed: No Muscle Exposed: No Joint Exposed: No Bone Exposed: No Limited to Skin Breakdown Periwound Skin Texture Texture Color No Abnormalities Noted: No No Abnormalities Noted: No Callus: No Atrophie Blanche: No Crepitus: No Cyanosis: No Excoriation: No Ecchymosis: No Fluctuance: No Erythema: Yes Friable: No Erythema Location: Circumferential Induration: No Hemosiderin Staining: No Localized Edema: Yes Mottled: No Rash: No Pallor: No Scarring: No Rubor: No Moisture Temperature / Pain No Abnormalities Noted: No Temperature: Hot Dry / Scaly: No Tenderness on Palpation: Yes Maceration: No Moist: No Wound Preparation Ulcer Cleansing: Rinsed/Irrigated with Saline Topical Anesthetic Applied: Other: lidocaine 4%, Treatment Notes Wound #1 (Left Toe Third) 1. Cleansed with: Clean wound with Normal Saline 2. Anesthetic Topical Lidocaine 4% cream to wound bed prior to debridement 4. Dressing Applied: Non-Adherent gauze 5. Secondary Dressing Applied Kerlix/Conform 7. Secured with Recruitment consultant) Signed: 05/17/2015 5:00:47 PM By: Arlee Muslim, Dinah ([id number]) Entered By: Montey Hora on 05/17/2015 14:41:55 [name] ([id number]) -------------------------------------------------------------------------------- Vitals Details Kelley, 05/17/2015 2:15 Patient Name: Date of Service: Littie PM Medical Record Patient Account Number: [id number] Number: Treating RN: Montey Hora Date of Birth/Sex: 04-Dec-1950 (64  y.o. Female) Other Clinician: Primary Care Treating Britto, [place] Physician: Physician/Extender: Referring Physician: Garner Nash in Treatment: 0 Vital Signs Time Taken: 14:21 Temperature (F): 98.4 Pulse (bpm): 70 Respiratory Rate (breaths/min): 18 Blood Pressure (mmHg): 117/51 Reference Range: 80 - 120 mg / dl Electronic Signature(s) Signed: 05/17/2015 5:00:47 PM By: Montey Hora Entered By: Montey Hora on 05/17/2015 14:22:51

## 2015-05-18 NOTE — Progress Notes (Signed)
Called ER RN for report. Room ready.  

## 2015-05-18 NOTE — Consult Note (Signed)
Reason for Consult: left foot osteomyelitis Referring Physician: [name] is an 64 y.o. female.  HPI: 64 yo female with a several week history of left 3rd toe pain and swelling.  Patient is a diabetic female who does not speak [place]. Patient admitted for work up of suspected left foot osteo.  Past Medical History  Diagnosis Date  . Diabetes mellitus   . Hypertension   . Hyperlipidemia   . Renal disorder   . Thyroid disease     History reviewed. No pertinent past surgical history.  History reviewed. No pertinent family history.  Social History:  reports that she has never smoked. She has never used smokeless tobacco. She reports that she does not drink alcohol or use illicit drugs.  Allergies: No Known Allergies  Medications: I have reviewed the patient's current medications.  Results for orders placed or performed during the hospital encounter of 05/17/15 (from the past 48 hour(s))  Comprehensive metabolic panel     Status: Abnormal   Collection Time: 05/17/15  5:54 PM  Result Value Ref Range   Sodium 141 135 - 145 mmol/L   Potassium 4.1 3.5 - 5.1 mmol/L   Chloride 107 101 - 111 mmol/L   CO2 23 22 - 32 mmol/L   Glucose, Bld 99 65 - 99 mg/dL   BUN 41 (H) 6 - 20 mg/dL   Creatinine, Ser 2.49 (H) 0.44 - 1.00 mg/dL   Calcium 9.4 8.9 - 10.3 mg/dL   Total Protein 7.5 6.5 - 8.1 g/dL   Albumin 3.4 (L) 3.5 - 5.0 g/dL   AST 26 15 - 41 U/L   ALT 16 14 - 54 U/L   Alkaline Phosphatase 90 38 - 126 U/L   Total Bilirubin 0.4 0.3 - 1.2 mg/dL   GFR calc non Af Amer 19 (L) >60 mL/min   GFR calc Af Amer 22 (L) >60 mL/min    Comment: (NOTE) The eGFR has been calculated using the CKD EPI equation. This calculation has not been validated in all clinical situations. eGFR's persistently <60 mL/min signify possible Chronic Kidney Disease.    Anion gap 11 5 - 15  CBC     Status: Abnormal   Collection Time: 05/17/15  5:54 PM  Result Value Ref Range   WBC 7.5 4.0 -  10.5 K/uL   RBC 3.90 3.87 - 5.11 MIL/uL   Hemoglobin 11.0 (L) 12.0 - 15.0 g/dL   HCT 34.2 (L) 36.0 - 46.0 %   MCV 87.7 78.0 - 100.0 fL   MCH 28.2 26.0 - 34.0 pg   MCHC 32.2 30.0 - 36.0 g/dL   RDW 13.5 11.5 - 15.5 %   Platelets 249 150 - 400 K/uL  Sedimentation rate     Status: Abnormal   Collection Time: 05/17/15 10:17 PM  Result Value Ref Range   Sed Rate 99 (H) 0 - 22 mm/hr  C-reactive protein     Status: Abnormal   Collection Time: 05/17/15 10:17 PM  Result Value Ref Range   CRP 5.9 (H) <1.0 mg/dL  Glucose, capillary     Status: None   Collection Time: 05/18/15  1:09 AM  Result Value Ref Range   Glucose-Capillary 66 65 - 99 mg/dL  Protime-INR     Status: Abnormal   Collection Time: 05/18/15  1:50 AM  Result Value Ref Range   Prothrombin Time 16.0 (H) 11.6 - 15.2 seconds   INR 1.26 0.00 - 1.49  APTT     Status: Abnormal  Collection Time: 05/18/15  1:50 AM  Result Value Ref Range   aPTT 43 (H) 24 - 37 seconds    Comment:        IF BASELINE aPTT IS ELEVATED, SUGGEST PATIENT RISK ASSESSMENT BE USED TO DETERMINE APPROPRIATE ANTICOAGULANT THERAPY.   Type and screen [place]     Status: None   Collection Time: 05/18/15  1:50 AM  Result Value Ref Range   ABO/RH(D) B POS    Antibody Screen NEG    Sample Expiration 05/21/2015   Lipase, blood     Status: None   Collection Time: 05/18/15  1:50 AM  Result Value Ref Range   Lipase 32 11 - 51 U/L  Basic metabolic panel     Status: Abnormal   Collection Time: 05/18/15  1:50 AM  Result Value Ref Range   Sodium 138 135 - 145 mmol/L   Potassium 3.9 3.5 - 5.1 mmol/L   Chloride 103 101 - 111 mmol/L   CO2 24 22 - 32 mmol/L   Glucose, Bld 82 65 - 99 mg/dL   BUN 40 (H) 6 - 20 mg/dL   Creatinine, Ser 2.31 (H) 0.44 - 1.00 mg/dL   Calcium 9.0 8.9 - 10.3 mg/dL   GFR calc non Af Amer 21 (L) >60 mL/min   GFR calc Af Amer 24 (L) >60 mL/min    Comment: (NOTE) The eGFR has been calculated using the CKD EPI  equation. This calculation has not been validated in all clinical situations. eGFR's persistently <60 mL/min signify possible Chronic Kidney Disease.    Anion gap 11 5 - 15  CBC     Status: Abnormal   Collection Time: 05/18/15  1:50 AM  Result Value Ref Range   WBC 8.0 4.0 - 10.5 K/uL   RBC 3.66 (L) 3.87 - 5.11 MIL/uL   Hemoglobin 10.5 (L) 12.0 - 15.0 g/dL   HCT 32.5 (L) 36.0 - 46.0 %   MCV 88.8 78.0 - 100.0 fL   MCH 28.7 26.0 - 34.0 pg   MCHC 32.3 30.0 - 36.0 g/dL   RDW 13.5 11.5 - 15.5 %   Platelets 250 150 - 400 K/uL  Lipid panel     Status: Abnormal   Collection Time: 05/18/15  1:50 AM  Result Value Ref Range   Cholesterol 134 0 - 200 mg/dL   Triglycerides 160 (H) <150 mg/dL   HDL 38 (L) >40 mg/dL   Total CHOL/HDL Ratio 3.5 RATIO   VLDL 32 0 - 40 mg/dL   LDL Cholesterol 64 0 - 99 mg/dL    Comment:        Total Cholesterol/HDL:CHD Risk Coronary Heart Disease Risk Table                     Men   Women  1/2 Average Risk   3.4   3.3  Average Risk       5.0   4.4  2 X Average Risk   9.6   7.1  3 X Average Risk  23.4   11.0        Use the calculated Patient Ratio above and the CHD Risk Table to determine the patient's CHD Risk.        ATP III CLASSIFICATION (LDL):  <100     mg/dL   Optimal  100-129  mg/dL   Near or Above  Optimal  130-159  mg/dL   Borderline  160-189  mg/dL   High  >190     mg/dL   Very High   ABO/Rh     Status: None   Collection Time: 05/18/15  1:50 AM  Result Value Ref Range   ABO/RH(D) B POS   Glucose, capillary     Status: None   Collection Time: 05/18/15  1:54 AM  Result Value Ref Range   Glucose-Capillary 71 65 - 99 mg/dL  Creatinine, urine, random     Status: None   Collection Time: 05/18/15  3:30 AM  Result Value Ref Range   Creatinine, Urine 51.52 mg/dL  Sodium, urine, random     Status: None   Collection Time: 05/18/15  3:30 AM  Result Value Ref Range   Sodium, Ur 77 mmol/L  Glucose, capillary     Status: None    Collection Time: 05/18/15  4:35 AM  Result Value Ref Range   Glucose-Capillary 81 65 - 99 mg/dL  Glucose, capillary     Status: None   Collection Time: 05/18/15  8:34 AM  Result Value Ref Range   Glucose-Capillary 68 65 - 99 mg/dL  Glucose, capillary     Status: Abnormal   Collection Time: 05/18/15  9:35 AM  Result Value Ref Range   Glucose-Capillary 131 (H) 65 - 99 mg/dL  Glucose, capillary     Status: None   Collection Time: 05/18/15 11:43 AM  Result Value Ref Range   Glucose-Capillary 94 65 - 99 mg/dL    Ct Abdomen Pelvis Wo Contrast  05/18/2015  CLINICAL DATA:  New onset abdominal pain EXAM: CT ABDOMEN AND PELVIS WITHOUT CONTRAST TECHNIQUE: Multidetector CT imaging of the abdomen and pelvis was performed following the standard protocol without IV contrast. COMPARISON:  None. FINDINGS: Lung bases are free of acute infiltrate or sizable effusion. A calcified granuloma is noted in the right lung base. The gallbladder, liver, spleen, adrenal glands and pancreas are within normal limits. The kidneys show mild fullness of the collecting systems bilaterally. This is likely related to the distended bladder as no definitive ureteral calculi are seen. The bladder is over distended. No filling defect is noted. Small amount of air is noted within the bladder which may be related to recent instrumentation. Clinical correlation is recommended. Diffuse vascular calcifications are noted throughout the uterus. No pelvic mass lesion is noted. No free pelvic fluid is seen. The appendix is not well visualized. No inflammatory changes are identified. Aortoiliac calcifications are noted without aneurysmal dilatation. The osseous structures show no acute abnormality. IMPRESSION: No acute abnormality noted. The bladder is distended with associated mild hydronephrosis bilaterally. Electronically Signed   By: Inez Catalina M.D.   On: 05/18/2015 07:41   Dg Foot 2 Views Left  05/17/2015  CLINICAL DATA:  Chronic  left third and fourth toe open wound for 2 months, with pain. Assess for osteomyelitis. Initial encounter. EXAM: LEFT FOOT - 2 VIEW COMPARISON:  None. FINDINGS: There is near complete absence of the third distal phalanx, with vague lucency at the third middle phalanx. This is compatible with osteomyelitis. No definite additional erosions are seen. Soft tissue swelling is noted about the third toe. Visualized joint spaces are otherwise preserved. Small plantar and posterior calcaneal spurs are seen. IMPRESSION: Near complete absence of the third distal phalanx, with vague lucency at the third middle phalanx. This is compatible with osteomyelitis. Soft tissue swelling about the third toe. Electronically Signed   By: Jacqulynn Cadet  Chang M.D.   On: 05/17/2015 23:00    ROS Blood pressure 145/54, pulse 79, temperature 99.4 F (37.4 C), temperature source Oral, resp. rate 16, weight 71.351 kg (157 lb 4.8 oz), SpO2 98 %. Physical Exam left foot with moderate foot and ankle swelling and 3rd toe dry gangrene.  Faint pulses noted.  The entire leg below the knee is tender to touch but compartments are supple and there is no obvious fluctuance or erythema. Generalized ischemic changes noted on both LEs in terms of darker skin and no hair.  Right foot is normal with no lesions and no swelling or tenderness.  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  Patient has diminished blood flow per dopplers, she was unable to tolerate BP cuffs to do an ABI. I discussed this patient with Dr Meridee Score, ortho foot and ankle specialist,  who has agreed to evaluate her tomorrow and plan per Dr Sharol Given.  Continue empiric antibiotics as you are and bed rest.  Sarah Kelley,STEVEN R 05/18/2015, 3:35 PM

## 2015-05-18 NOTE — Progress Notes (Signed)
Hypoglycemic Event  CBG: Results for DAIONNA, NANDA (MRN [id number]) as of 05/18/2015 01:50  Ref. Range 05/18/2015 01:09  Glucose-Capillary Latest Ref Range: 65-99 mg/dL 66    Treatment: 15 GM carbohydrate snack  Symptoms: None  Follow-up CBG: Time: CBG Result: Results for JAMAR, RUMMELL (MRN [id number]) as of 05/18/2015 02:01  Ref. Range 05/18/2015 01:54  Glucose-Capillary Latest Ref Range: 65-99 mg/dL 71    Possible Reasons for Event: Inadequate meal intake  Comments/MD notified:    Viviano Simas

## 2015-05-18 NOTE — Progress Notes (Signed)
WILLIEMAE, SHULL ([id number]) Visit Report for 05/17/2015 Abuse/Suicide Risk Screen Details GUTIERREZ-GONZALES, 05/17/2015 2:15 Patient Name: Date of Service: Sarah Kelley Medical Record Patient Account Number: [id number] Number: Treating RN: Montey Hora Date of Birth/Sex: 1951/05/30 (64 y.o. Female) Other Clinician: Primary Care Treating Britto, Errol Physician: Physician/Extender: Referring Physician: Garner Nash in Treatment: 0 Abuse/Suicide Risk Screen Items Answer ABUSE/SUICIDE RISK SCREEN: Has anyone close to you tried to hurt or harm you recentlyo No Do you feel uncomfortable with anyone in your familyo No Has anyone forced you do things that you didnot want to doo No Do you have any thoughts of harming yourselfo No Patient displays signs or symptoms of abuse and/or neglect. No Electronic Signature(s) Signed: 05/17/2015 5:00:47 Kelley By: Montey Hora Entered By: Montey Hora on 05/17/2015 14:30:12 [name] ([id number]) -------------------------------------------------------------------------------- Activities of Daily Living Details GUTIERREZ-GONZALES, 05/17/2015 2:15 Patient Name: Date of Service: Naja Kelley Medical Record Patient Account Number: [id number] Number: Treating RN: Montey Hora Date of Birth/Sex: 03/07/1951 (64 y.o. Female) Other Clinician: Primary Care Treating Britto, Errol Physician: Physician/Extender: Referring Physician: Garner Nash in Treatment: 0 Activities of Daily Living Items Answer Activities of Daily Living (Please select one for each item) Drive Automobile Not Able Take Medications Completely Able Use Telephone Completely Able Care for Appearance Completely Able Use Toilet Completely Able Bath / Shower Completely Able Dress Self Completely Able Feed Self Completely Able Walk Completely Able Get In / Out Bed Completely Able Housework Completely Able Prepare Meals  Completely Able Handle Money Completely Able Shop for Self Completely Able Electronic Signature(s) Signed: 05/17/2015 5:00:47 Kelley By: Montey Hora Entered By: Montey Hora on 05/17/2015 14:30:39 [name] ([id number]) -------------------------------------------------------------------------------- Education Assessment Details GUTIERREZ-GONZALES, 05/17/2015 2:15 Patient Name: Date of Service: Shae Kelley Medical Record Patient Account Number: [id number] Number: Treating RN: Montey Hora Date of Birth/Sex: [birth year] (64 y.o. Female) Other Clinician: Primary Care Treating Britto, Errol Physician: Physician/Extender: Referring Physician: Garner Nash in Treatment: 0 Primary Learner Assessed: Caregiver Reason Patient is not Primary Learner: wound location Learning Preferences/Education Level/Primary Language Learning Preference: Explanation, Demonstration Highest Education Level: High School Preferred Language: Spanish Cognitive Barrier Assessment/Beliefs Language Barrier: Yes Translator Needed: No Memory Deficit: No Emotional Barrier: No Cultural/Religious Beliefs Affecting Medical No Care: Physical Barrier Assessment Impaired Vision: No Impaired Hearing: No Decreased Hand dexterity: No Knowledge/Comprehension Assessment Knowledge Level: Medium Comprehension Level: Medium Ability to understand written Medium instructions: Ability to understand verbal Medium instructions: Motivation Assessment Anxiety Level: Calm Cooperation: Cooperative Education Importance: Acknowledges Need Interest in Health Problems: Asks Questions Perception: Coherent Willingness to Engage in Self- Medium Management Activities: ARNASIA, VIANO ([id number]) Readiness to Engage in Self- Medium Management Activities: Electronic Signature(s) Signed: 05/17/2015 5:00:47 Kelley By: Montey Hora Entered By: Montey Hora on 05/17/2015  14:31:08 [name] ([id number]) -------------------------------------------------------------------------------- Fall Risk Assessment Details GUTIERREZ-GONZALES, 05/17/2015 2:15 Patient Name: Date of Service: Shalini Kelley Medical Record Patient Account Number: [id number] Number: Treating RN: Montey Hora Date of Birth/Sex: 15-Apr-1951 (64 y.o. Female) Other Clinician: Primary Care Treating Britto, Errol Physician: Physician/Extender: Referring Physician: Garner Nash in Treatment: 0 Fall Risk Assessment Items FALL RISK ASSESSMENT: History of falling - immediate or within 3 months 0 No Secondary diagnosis 0 No Ambulatory aid None/bed rest/wheelchair/nurse 0 No Crutches/cane/walker 15 Yes Furniture 0 No IV Access/Saline Lock 0 No Gait/Training Normal/bed rest/immobile 0 No Weak 10 Yes Impaired 0 No Mental Status Oriented to own ability 0 Yes Electronic Signature(s) Signed: 05/17/2015 5:00:47 Kelley By: Montey Hora Entered By: Montey Hora on  05/17/2015 14:31:20 CELETA, EICHER ([id number]) -------------------------------------------------------------------------------- Foot Assessment Details GUTIERREZ-GONZALES, 05/17/2015 2:15 Patient Name: Date of Service: Polette Kelley Medical Record Patient Account Number: [id number] Number: Treating RN: Montey Hora Date of Birth/Sex: 1951/07/01 (64 y.o. Female) Other Clinician: Primary Care Treating Britto, Errol Physician: Physician/Extender: Referring Physician: Garner Nash in Treatment: 0 Foot Assessment Items Site Locations + = Sensation present, - = Sensation absent, C = Callus, U = Ulcer R = Redness, W = Warmth, M = Maceration, PU = Pre-ulcerative lesion F = Fissure, S = Swelling, D = Dryness Assessment Right: Left: Other Deformity: No No Prior Foot Ulcer: No No Prior Amputation: No No Charcot Joint: No No Ambulatory Status: Ambulatory With Help Assistance  Device: Cane Gait: Steady Electronic Signature(s) Signed: 05/17/2015 5:00:47 Kelley By: Arlee Muslim, Rozelia ([id number]) Entered By: Montey Hora on 05/17/2015 14:40:03 [name] ([id number]) -------------------------------------------------------------------------------- Nutrition Risk Assessment Details GUTIERREZ-GONZALES, 05/17/2015 2:15 Patient Name: Date of Service: Aneshia Kelley Medical Record Patient Account Number: [id number] Number: Treating RN: Montey Hora Date of Birth/Sex: Jul 03, 1951 (64 y.o. Female) Other Clinician: Primary Care Treating Britto, Lyerly Physician: Physician/Extender: Referring Physician: Garner Nash in Treatment: 0 Height (in): Weight (lbs): Body Mass Index (BMI): Nutrition Risk Assessment Items NUTRITION RISK SCREEN: I have an illness or condition that made me change the kind and/or 0 No amount of food I eat I eat fewer than two meals per day 0 No I eat few fruits and vegetables, or milk products 0 No I have three or more drinks of beer, liquor or wine almost every day 0 No I have tooth or mouth problems that make it hard for me to eat 0 No I don't always have enough money to buy the food I need 0 No I eat alone most of the time 0 No I take three or more different prescribed or over-the-counter drugs a 1 Yes day Without wanting to, I have lost or gained 10 pounds in the last six 0 No months I am not always physically able to shop, cook and/or feed myself 0 No Nutrition Protocols Good Risk Protocol 0 No interventions needed Moderate Risk Protocol Electronic Signature(s) Signed: 05/17/2015 5:00:47 Kelley By: Montey Hora Entered By: Montey Hora on 05/17/2015 14:31:27

## 2015-05-18 NOTE — Progress Notes (Signed)
Sarah Kelley, Sarah Kelley ([id number]) Visit Report for 05/17/2015 Chief Complaint Document Details Sarah Kelley, 05/17/2015 2:15 Patient Name: Date of Service: [place] PM Medical Record Patient Account Number: [id number] Number: Treating RN: Montey Hora Date of Birth/Sex: 1951-07-01 (64 y.o. Female) Other Clinician: Primary Care Treating Linas Stepter, [place] Physician: Physician/Extender: Referring Physician: Garner Nash in Treatment: 0 Information Obtained from: Patient Chief Complaint Patients presents for treatment of an open diabetic ulcer. Patient does have an ulcer on the third toe of the left foot for over 2 months. Electronic Signature(s) Signed: 05/17/2015 3:34:16 PM By: Christin Fudge MD, FACS Previous Signature: 05/17/2015 2:40:36 PM Version By: Christin Fudge MD, FACS Entered By: Christin Fudge on 05/17/2015 15:34:16 Sarah Kelley ([id number]) -------------------------------------------------------------------------------- HPI Details Sarah Kelley, 05/17/2015 2:15 Patient Name: Date of Service: [place] PM Medical Record Patient Account Number: [id number] Number: Treating RN: Montey Hora Date of Birth/Sex: 09/13/1950 (64 y.o. Female) Other Clinician: Primary Care Treating Deundra Bard, [place] Physician: Physician/Extender: Referring Physician: Garner Nash in Treatment: 0 History of Present Illness Location: significant ulcer pain and discharge from the left third toe with swelling of the foot Quality: Patient reports experiencing a sharp pain to affected area(s). Severity: Patient states wound are getting worse. Duration: Patient has had the wound for > 2 months prior to seeking treatment at the wound center Timing: Pain in wound is constant (hurts all the time) Context: The wound appeared gradually over time Modifying Factors: Consults to this date include:has seen a podiatrist in [place] on 2 different occasions an  x-ray was taken and she was told she had osteomyelitis and needs to go to the wound center. She has been on 2 courses of antibiotics since then. Associated Signs and Symptoms: Patient reports having difficulty standing for long periods. HPI Description: 64 year old patient who comes to see [place] from her podiatrist's office Dr. Barkley Bruns was been seeing the patient since late October. The patient presented to him with a ulcer on the left foot which was there for 2 months and was known to have uncontrolled diabetes. At the initial visit and x-ray was done and there was no gas, decreased bone density was noted and no fracture was noted. There is a diagnosis of acute osteomellitus of the left foot but I do not know who has read this report. The patient was put on Keflex and local wound care was discussed with Epsom Salt soaks and hydrogel. The patient was seen back on November 3, and this time around seven-day course of Cefaclor 500 mg 3 times a day for 7 days was prescribed. The patient was also referred to the wound care center for further evaluation and discussion was had regarding the osteomyelitis of the third toe on the left side and possibility of amputation was also discussed.. A surgical shoe was advised for offloading. The specimen of bone was sent for culture. The patient and her son have understood that surgery would be done today in the wound clinic outpatient center. This entire conversation was had with the help of a Spanish-speaking interpreter. Electronic Signature(s) Signed: 05/17/2015 3:37:01 PM By: Christin Fudge MD, FACS Previous Signature: 05/17/2015 2:57:31 PM Version By: Christin Fudge MD, FACS Previous Signature: 05/17/2015 2:49:14 PM Version By: Christin Fudge MD, FACS Entered By: Christin Fudge on 05/17/2015 15:37:01 Sarah Kelley ([id number]) -------------------------------------------------------------------------------- Physical Exam Details Sarah Kelley,  05/17/2015 2:15 Patient Name: Date of Service: [place] PM Medical Record Patient Account Number: [id number] Number: Treating RN: Montey Hora Date of Birth/Sex: 1950-12-07 (64 y.o. Female) Other Clinician: Primary Care Treating  Christin Fudge Physician: Physician/Extender: Referring Physician: Garner Nash in Treatment: 0 Constitutional . Pulse regular. Respirations normal and unlabored. Afebrile. . Eyes Nonicteric. Reactive to light. Ears, Nose, Mouth, and Throat Lips, teeth, and gums WNL.Marland Kitchen Moist mucosa without lesions . Neck supple and nontender. No palpable supraclavicular or cervical adenopathy. Normal sized without goiter. Respiratory WNL. No retractions.. Breath sounds WNL, No rubs, rales, rhonchi, or wheeze.. Cardiovascular Pedal Pulses WNL. ABI on the left could not be measured but on the right is 1.07.. she has significant segmental or of venous hypertension with palpable varicose veins of the left lower extremity. Chest Breasts symmetical and no nipple discharge.. Breast tissue WNL, no masses, lumps, or tenderness.. Lymphatic No adneopathy. No adenopathy. No adenopathy. Musculoskeletal Adexa without tenderness or enlargement.. Digits and nails w/o clubbing, cyanosis, infection, petechiae, ischemia, or inflammatory conditions.. Integumentary (Hair, Skin) No suspicious lesions. No crepitus or fluctuance. No peri-wound warmth or erythema. No masses.Marland Kitchen Psychiatric Judgement and insight Intact.. No evidence of depression, anxiety, or agitation.. Notes The patient's left third toe has significant pus and necrosis and possibly has osteomellitus clinically. There is cellulitis of the forefoot and edema. This is a Personal assistant III diabetic foot ulcer. it is extremely tender to touch and has significant odor Electronic Signature(s) Signed: 05/17/2015 3:38:58 PM By: Christin Fudge MD, FACS Entered By: Christin Fudge on 05/17/2015 15:38:58 Sarah Kelley, Sarah Kelley ([id number]) Sarah Kelley, Sarah Kelley ([id number]) -------------------------------------------------------------------------------- Physician Orders Details Sarah Kelley, 05/17/2015 2:15 Patient Name: Date of Service: [place] PM Medical Record Patient Account Number: [id number] Number: Treating RN: Montey Hora Date of Birth/Sex: [birth year] (64 y.o. Female) Other Clinician: Primary Care Treating Kawena Lyday, [place] Physician: Physician/Extender: Referring Physician: Garner Nash in Treatment: 0 Verbal / Phone Orders: Yes Clinician: Montey Hora Read Back and Verified: Yes Diagnosis Coding ICD-10 Coding Code Description E11.621 Type 2 diabetes mellitus with foot ulcer L97.522 Non-pressure chronic ulcer of other part of left foot with fat layer exposed Wound Cleansing Wound #1 Left Toe Third o Clean wound with Normal Saline. Primary Wound Dressing Wound #1 Left Toe Third o Non-adherent pad Secondary Dressing Wound #1 Left Toe Third o Conform/Kerlix Follow-up Appointments Wound #1 Left Toe Third o Other: - if needed after hospital stay Notes Go to [facility] ED for foot infection Electronic Signature(s) Signed: 05/17/2015 4:17:28 PM By: Christin Fudge MD, FACS Signed: 05/17/2015 5:00:47 PM By: Montey Hora Entered By: Montey Hora on 05/17/2015 15:08:57 Sarah Kelley ([id number]) -------------------------------------------------------------------------------- Problem List Details Sarah Kelley, 05/17/2015 2:15 Patient Name: Date of Service: [place] PM Medical Record Patient Account Number: [id number] Number: Treating RN: Montey Hora Date of Birth/Sex: 27-Jun-1951 (64 y.o. Female) Other Clinician: Primary Care Treating Nickie Warwick Physician: Physician/Extender: Referring Physician: Garner Nash in Treatment: 0 Active Problems ICD-10 Encounter Code Description Active Date Diagnosis E11.621 Type 2  diabetes mellitus with foot ulcer 05/17/2015 Yes L97.522 Non-pressure chronic ulcer of other part of left foot with fat 05/17/2015 Yes layer exposed M86.172 Other acute osteomyelitis, left ankle and foot 05/17/2015 Yes I87.312 Chronic venous hypertension (idiopathic) with ulcer of left 05/17/2015 Yes lower extremity Inactive Problems Resolved Problems Electronic Signature(s) Signed: 05/17/2015 3:33:41 PM By: Christin Fudge MD, FACS Previous Signature: 05/17/2015 2:40:10 PM Version By: Christin Fudge MD, FACS Entered By: Christin Fudge on 05/17/2015 15:33:41 Sarah Kelley ([id number]) -------------------------------------------------------------------------------- Progress Note Details Sarah Kelley, 05/17/2015 2:15 Patient Name: Date of Service: [place] PM Medical Record Patient Account Number: [id number] Number: Treating RN: Montey Hora Date of Birth/Sex: 1950-09-26 (64 y.o. Female) Other Clinician: Primary Care Treating Aurelie Dicenzo,  Atha Muradyan Physician: Physician/Extender: Referring Physician: Garner Nash in Treatment: 0 Subjective Chief Complaint Information obtained from Patient Patients presents for treatment of an open diabetic ulcer. Patient does have an ulcer on the third toe of the left foot for over 2 months. History of Present Illness (HPI) The following HPI elements were documented for the patient's wound: Location: significant ulcer pain and discharge from the left third toe with swelling of the foot Quality: Patient reports experiencing a sharp pain to affected area(s). Severity: Patient states wound are getting worse. Duration: Patient has had the wound for > 2 months prior to seeking treatment at the wound center Timing: Pain in wound is constant (hurts all the time) Context: The wound appeared gradually over time Modifying Factors: Consults to this date include:has seen a podiatrist in [place] on 2 different occasions an x-ray was  taken and she was told she had osteomyelitis and needs to go to the wound center. She has been on 2 courses of antibiotics since then. Associated Signs and Symptoms: Patient reports having difficulty standing for long periods. 64 year old patient who comes to see [place] from her podiatrist's office Dr. Barkley Bruns was been seeing the patient since late October. The patient presented to him with a ulcer on the left foot which was there for 2 months and was known to have uncontrolled diabetes. At the initial visit and x-ray was done and there was no gas, decreased bone density was noted and no fracture was noted. There is a diagnosis of acute osteomellitus of the left foot but I do not know who has read this report. The patient was put on Keflex and local wound care was discussed with Epsom Salt soaks and hydrogel. The patient was seen back on November 3, and this time around seven-day course of Cefaclor 500 mg 3 times a day for 7 days was prescribed. The patient was also referred to the wound care center for further evaluation and discussion was had regarding the osteomyelitis of the third toe on the left side and possibility of amputation was also discussed.. A surgical shoe was advised for offloading. The specimen of bone was sent for culture. The patient and her son have understood that surgery would be done today in the wound clinic outpatient center. This entire conversation was had with the help of a Spanish-speaking interpreter. Wound History Sarah Kelley, Sarah Kelley ([id number]) Patient presents with 1 open wound that has been present for approximately 1 month. Patient has been treating wound in the following manner: creams, wrapping. Laboratory tests have been performed in the last month. Patient reportedly has not tested positive for an antibiotic resistant organism. Patient reportedly has tested positive for osteomyelitis. Patient reportedly has not had testing performed to evaluate circulation  in the legs. Patient History Information obtained from Patient. Allergies No Known Drug Allergies Social History Never smoker, Marital Status - Married, Alcohol Use - Never, Drug Use - No History, Caffeine Use - Never. Medical History Cardiovascular Patient has history of Hypertension Endocrine Patient has history of Type II Diabetes Neurologic Patient has history of Neuropathy Oncologic Denies history of Received Chemotherapy, Received Radiation Patient is treated with Insulin, Oral Agents. Blood sugar is tested. Review of Systems (ROS) Constitutional Symptoms (General Health) The patient has no complaints or symptoms. Eyes The patient has no complaints or symptoms. Ear/Nose/Mouth/Throat The patient has no complaints or symptoms. Hematologic/Lymphatic The patient has no complaints or symptoms. Respiratory The patient has no complaints or symptoms. Cardiovascular The patient has no complaints or symptoms.  Gastrointestinal The patient has no complaints or symptoms. Genitourinary The patient has no complaints or symptoms. Immunological The patient has no complaints or symptoms. Integumentary (Skin) The patient has no complaints or symptoms. Musculoskeletal The patient has no complaints or symptoms. Sarah Kelley, Sarah Kelley ([id number]) Neurologic The patient has no complaints or symptoms. Oncologic The patient has no complaints or symptoms. Psychiatric The patient has no complaints or symptoms. Medications gabapentin 100 mg capsule oral 1 1 capsule oral Januvia 25 mg tablet oral 1 1 tablet oral Crestor 10 mg tablet oral 1 1 tablet oral Benicar 40 mg tablet oral 1 1 tablet oral amlodipine 5 mg tablet oral 1 1 tablet oral cefaclor ER 500 mg tablet,extended release,12 hr oral 1 1 tablet extended release 12 hr oral Lantus Solostar 100 unit/mL (3 mL) subcutaneous insulin pen subcutaneous insulin pen subcutaneous furosemide 40 mg tablet oral 1 1 tablet oral hydrocodone  7.5 mg-acetaminophen 325 mg tablet oral 1 1 tablet oral Myrbetriq 25 mg tablet,extended release oral 1 1 tablet extended release 24 hr oral Objective Constitutional Pulse regular. Respirations normal and unlabored. Afebrile. Vitals Time Taken: 2:21 PM, Temperature: 98.4 F, Pulse: 70 bpm, Respiratory Rate: 18 breaths/min, Blood Pressure: 117/51 mmHg. Eyes Nonicteric. Reactive to light. Ears, Nose, Mouth, and Throat Lips, teeth, and gums WNL.Marland Kitchen Moist mucosa without lesions . Neck supple and nontender. No palpable supraclavicular or cervical adenopathy. Normal sized without goiter. Respiratory WNL. No retractions.. Breath sounds WNL, No rubs, rales, rhonchi, or wheeze.. Cardiovascular Pedal Pulses WNL. ABI on the left could not be measured but on the right is 1.07.. she has significant Sarah Kelley, Sarah Kelley ([id number]) segmental or of venous hypertension with palpable varicose veins of the left lower extremity. Chest Breasts symmetical and no nipple discharge.. Breast tissue WNL, no masses, lumps, or tenderness.. Lymphatic No adneopathy. No adenopathy. No adenopathy. Musculoskeletal Adexa without tenderness or enlargement.. Digits and nails w/o clubbing, cyanosis, infection, petechiae, ischemia, or inflammatory conditions.Marland Kitchen Psychiatric Judgement and insight Intact.. No evidence of depression, anxiety, or agitation.. General Notes: The patient's left third toe has significant pus and necrosis and possibly has osteomellitus clinically. There is cellulitis of the forefoot and edema. This is a Personal assistant III diabetic foot ulcer. it is extremely tender to touch and has significant odor Integumentary (Hair, Skin) No suspicious lesions. No crepitus or fluctuance. No peri-wound warmth or erythema. No masses.. Wound #1 status is Open. Original cause of wound was Blister. The wound is located on the Left Toe Third. The wound measures 0.7cm length x 1.3cm width x 0.1cm depth; 0.715cm^2 area  and 0.071cm^3 volume. The wound is limited to skin breakdown. There is no tunneling or undermining noted. There is a small amount of serous drainage noted. The wound margin is flat and intact. There is no granulation within the wound bed. There is a large (67-100%) amount of necrotic tissue within the wound bed including Eschar and Adherent Slough. The periwound skin appearance exhibited: Localized Edema, Erythema. The periwound skin appearance did not exhibit: Callus, Crepitus, Excoriation, Fluctuance, Friable, Induration, Rash, Scarring, Dry/Scaly, Maceration, Moist, Atrophie Blanche, Cyanosis, Ecchymosis, Hemosiderin Staining, Mottled, Pallor, Rubor. The surrounding wound skin color is noted with erythema which is circumferential. Periwound temperature was noted as Hot. The periwound has tenderness on palpation. Assessment Active Problems ICD-10 E11.621 - Type 2 diabetes mellitus with foot ulcer L97.522 - Non-pressure chronic ulcer of other part of left foot with fat layer exposed M86.172 - Other acute osteomyelitis, left ankle and foot I87.312 - Chronic venous hypertension (idiopathic) with  ulcer of left lower extremity Sarah Kelley, Sarah Kelley ([id number]) This 64 year old diabetic patient with poorly controlled diabetes has a wound on the left second toe for over 2 months and has been under the treatment of her podiatrist since October 27 and has been on 2 different doses of antibiotics. She has a Wagner grade 3 diabetic foot wound which has not responded to oral antibiotics. There was a x-ray done in the podiatry office which showed acute osteomyelitis and this patient was referred to the wound center on November 3 for possible surgery. They may not have understood the advice of the podiatrist due to her language barrier.. Through a Spanish-speaking interpreter I have had a thorough discussion with the patient's son and the patient, regarding the advanced nature of this necrotic third  toe on the left foot. I have recommended that she go straight to the ER for further workup and care which would include a possible MRI, admission for IV antibiotics and referral for surgery. She has failed outpatient therapy and needs urgent inpatient care. they understand the treatment plan have had all their questions answered and will be compliant. At some later stage, after this hospital admission, if she still has a poorly healing wound and needs further care, we would be happy to see her back in the wound center and plan appropriate outpatient therapy. Plan Wound Cleansing: Wound #1 Left Toe Third: Clean wound with Normal Saline. Primary Wound Dressing: Wound #1 Left Toe Third: Non-adherent pad Secondary Dressing: Wound #1 Left Toe Third: Conform/Kerlix Follow-up Appointments: Wound #1 Left Toe Third: Other: - if needed after hospital stay General Notes: Go to [facility] ED for foot infection This 64 year old diabetic patient with poorly controlled diabetes has a wound on the left second toe for over 2 months and has been under the treatment of her podiatrist since October 27 and has been on 2 different doses of antibiotics. She has a Wagner grade 3 diabetic foot wound which has not responded to oral antibiotics. AZAILYA, MCGANNON ([id number]) There was a x-ray done in the podiatry office which showed acute osteomyelitis and this patient was referred to the wound center on November 3 for possible surgery. They may not have understood the advice of the podiatrist due to her language barrier.. Through a Spanish-speaking interpreter I have had a thorough discussion with the patient's son and the patient, regarding the advanced nature of this necrotic third toe on the left foot. I have recommended that she go straight to the ER for further workup and care which would include a possible MRI, admission for IV antibiotics and referral for surgery. She has failed outpatient therapy and  needs urgent inpatient care. they understand the treatment plan have had all their questions answered and will be compliant. At some later stage, after this hospital admission, if she still has a poorly healing wound and needs further care, we would be happy to see her back in the wound center and plan appropriate outpatient therapy. Electronic Signature(s) Signed: 05/17/2015 3:45:36 PM By: Christin Fudge MD, FACS Entered By: Christin Fudge on 05/17/2015 15:45:36 Sarah Kelley ([id number]) -------------------------------------------------------------------------------- ROS/PFSH Details Sarah Kelley, 05/17/2015 2:15 Patient Name: Date of Service: [place] PM Medical Record Patient Account Number: [id number] Number: Treating RN: Montey Hora Date of Birth/Sex: [birth year] (64 y.o. Female) Other Clinician: Primary Care Treating Stefon Ramthun, [place] Physician: Physician/Extender: Referring Physician: Garner Nash in Treatment: 0 Information Obtained From Patient Wound History Do you currently have one or more open woundso Yes How many open wounds do  you currently haveo 1 Approximately how long have you had your woundso 1 month How have you been treating your wound(s) until nowo creams, wrapping Has your wound(s) ever healed and then re-openedo No Have you had any lab work done in the past montho Yes Who ordered the lab work [place] PCP Have you tested positive for an antibiotic resistant organism (MRSA, VRE)o No Have you tested positive for osteomyelitis (bone infection)o Yes Date: 05/13/2015 Have you had any tests for circulation on your legso No Constitutional Symptoms (General Health) Complaints and Symptoms: No Complaints or Symptoms Eyes Complaints and Symptoms: No Complaints or Symptoms Ear/Nose/Mouth/Throat Complaints and Symptoms: No Complaints or Symptoms Hematologic/Lymphatic Complaints and Symptoms: No Complaints or  Symptoms Respiratory Sarah Kelley, Sarah Kelley ([id number]) Complaints and Symptoms: No Complaints or Symptoms Cardiovascular Complaints and Symptoms: No Complaints or Symptoms Medical History: Positive for: Hypertension Gastrointestinal Complaints and Symptoms: No Complaints or Symptoms Endocrine Medical History: Positive for: Type II Diabetes Time with diabetes: 26 years Treated with: Insulin, Oral agents Blood sugar tested every day: Yes Tested : QD Genitourinary Complaints and Symptoms: No Complaints or Symptoms Immunological Complaints and Symptoms: No Complaints or Symptoms Integumentary (Skin) Complaints and Symptoms: No Complaints or Symptoms Musculoskeletal Complaints and Symptoms: No Complaints or Symptoms Neurologic Complaints and Symptoms: No Complaints or Symptoms Medical History: Positive for: Neuropathy Sarah Kelley, Emalene ([id number]) Oncologic Complaints and Symptoms: No Complaints or Symptoms Medical History: Negative for: Received Chemotherapy; Received Radiation Psychiatric Complaints and Symptoms: No Complaints or Symptoms Immunizations Immunization Notes: up to date Family and Social History Never smoker; Marital Status - Married; Alcohol Use: Never; Drug Use: No History; Caffeine Use: Never; Financial Concerns: No; Food, Clothing or Shelter Needs: No; Support System Lacking: No; Transportation Concerns: No; Advanced Directives: No; Patient does not want information on Advanced Directives Physician Affirmation I have reviewed and agree with the above information. Electronic Signature(s) Signed: 05/17/2015 2:38:42 PM By: Christin Fudge MD, FACS Signed: 05/17/2015 5:00:47 PM By: Montey Hora Entered By: Christin Fudge on 05/17/2015 14:38:41 Sarah Kelley ([id number]) -------------------------------------------------------------------------------- SuperBill Details Sarah Kelley, Date of Service: 05/17/2015 Patient  Name: [place] Patient Account Number: [id number] Medical Record [place], [id number] Treating RN: Number: Di Kindle Date of Birth/Sex: [birth year] (64 y.o. Female) Other Clinician: Primary Care Physician: Treating Christin Fudge Referring Physician: Inocencio Homes Physician/Extender: Weeks in Treatment: 0 Diagnosis Coding ICD-10 Codes Code Description E11.621 Type 2 diabetes mellitus with foot ulcer L97.522 Non-pressure chronic ulcer of other part of left foot with fat layer exposed M86.172 Other acute osteomyelitis, left ankle and foot I87.312 Chronic venous hypertension (idiopathic) with ulcer of left lower extremity Facility Procedures CPT4 Code: [id number] Description: [id number] - WOUND CARE VISIT-LEV 4 EST PT Modifier: Quantity: 1 Physician Procedures CPT4 Code Description: [id number] - WC PHYS LEVEL 4 - NEW PT ICD-10 Description Diagnosis E11.621 Type 2 diabetes mellitus with foot ulcer L97.522 Non-pressure chronic ulcer of other part of left foo M86.172 Other acute osteomyelitis, left ankle and  foot I87.312 Chronic venous hypertension (idiopathic) with ulcer Modifier: t with fat layer of left lower ext Quantity: 1 exposed remity Electronic Signature(s) Signed: 05/17/2015 3:46:51 PM By: Christin Fudge MD, FACS Previous Signature: 05/17/2015 3:45:51 PM Version By: Christin Fudge MD, FACS Entered By: Christin Fudge on 05/17/2015 15:46:51

## 2015-05-18 NOTE — Progress Notes (Signed)
Per family  patient still need to drink her second bottle of  Contrast. Second bottle  was left in her room in ED. Called CT and stated she should have drank two already. Patient is ready then for CT.

## 2015-05-19 ENCOUNTER — Other Ambulatory Visit (HOSPITAL_COMMUNITY): Payer: Self-pay | Admitting: Orthopedic Surgery

## 2015-05-19 ENCOUNTER — Encounter (HOSPITAL_COMMUNITY): Payer: Self-pay | Admitting: *Deleted

## 2015-05-19 DIAGNOSIS — L089 Local infection of the skin and subcutaneous tissue, unspecified: Secondary | ICD-10-CM

## 2015-05-19 DIAGNOSIS — E785 Hyperlipidemia, unspecified: Secondary | ICD-10-CM

## 2015-05-19 DIAGNOSIS — I10 Essential (primary) hypertension: Secondary | ICD-10-CM

## 2015-05-19 DIAGNOSIS — Z794 Long term (current) use of insulin: Secondary | ICD-10-CM

## 2015-05-19 DIAGNOSIS — M86272 Subacute osteomyelitis, left ankle and foot: Secondary | ICD-10-CM

## 2015-05-19 DIAGNOSIS — N184 Chronic kidney disease, stage 4 (severe): Secondary | ICD-10-CM

## 2015-05-19 DIAGNOSIS — E1169 Type 2 diabetes mellitus with other specified complication: Principal | ICD-10-CM

## 2015-05-19 DIAGNOSIS — E1121 Type 2 diabetes mellitus with diabetic nephropathy: Secondary | ICD-10-CM

## 2015-05-19 DIAGNOSIS — N179 Acute kidney failure, unspecified: Secondary | ICD-10-CM

## 2015-05-19 LAB — BASIC METABOLIC PANEL
Anion gap: 9 (ref 5–15)
BUN: 23 mg/dL — ABNORMAL HIGH (ref 6–20)
CALCIUM: 8.7 mg/dL — AB (ref 8.9–10.3)
CO2: 22 mmol/L (ref 22–32)
CREATININE: 2.04 mg/dL — AB (ref 0.44–1.00)
Chloride: 111 mmol/L (ref 101–111)
GFR calc non Af Amer: 24 mL/min — ABNORMAL LOW (ref >60)
GFR, EST AFRICAN AMERICAN: 28 mL/min — AB (ref >60)
Glucose, Bld: 141 mg/dL — ABNORMAL HIGH (ref 65–99)
Potassium: 4.1 mmol/L (ref 3.5–5.1)
SODIUM: 142 mmol/L (ref 135–145)

## 2015-05-19 LAB — CBC
HCT: 30.3 % — ABNORMAL LOW (ref 36.0–46.0)
HEMOGLOBIN: 10 g/dL — AB (ref 12.0–15.0)
MCH: 28.7 pg (ref 26.0–34.0)
MCHC: 33 g/dL (ref 30.0–36.0)
MCV: 86.8 fL (ref 78.0–100.0)
Platelets: 210 10*3/uL (ref 150–400)
RBC: 3.49 MIL/uL — ABNORMAL LOW (ref 3.87–5.11)
RDW: 13.3 % (ref 11.5–15.5)
WBC: 6.5 10*3/uL (ref 4.0–10.5)

## 2015-05-19 LAB — GLUCOSE, CAPILLARY
GLUCOSE-CAPILLARY: 131 mg/dL — AB (ref 65–99)
GLUCOSE-CAPILLARY: 155 mg/dL — AB (ref 65–99)
GLUCOSE-CAPILLARY: 175 mg/dL — AB (ref 65–99)
GLUCOSE-CAPILLARY: 193 mg/dL — AB (ref 65–99)
GLUCOSE-CAPILLARY: 215 mg/dL — AB (ref 65–99)
Glucose-Capillary: 172 mg/dL — ABNORMAL HIGH (ref 65–99)
Glucose-Capillary: 263 mg/dL — ABNORMAL HIGH (ref 65–99)

## 2015-05-19 LAB — HEMOGLOBIN A1C
Hgb A1c MFr Bld: 9.8 % — ABNORMAL HIGH (ref 4.8–5.6)
Mean Plasma Glucose: 235 mg/dL

## 2015-05-19 MED ORDER — HEPARIN SODIUM (PORCINE) 5000 UNIT/ML IJ SOLN
5000.0000 [IU] | Freq: Three times a day (TID) | INTRAMUSCULAR | Status: DC
  Administered 2015-05-19 – 2015-05-23 (×13): 5000 [IU] via SUBCUTANEOUS
  Filled 2015-05-19 (×6): qty 1

## 2015-05-19 MED ORDER — INSULIN GLARGINE 100 UNIT/ML ~~LOC~~ SOLN
10.0000 [IU] | Freq: Every day | SUBCUTANEOUS | Status: DC
  Administered 2015-05-19 – 2015-05-22 (×4): 10 [IU] via SUBCUTANEOUS
  Filled 2015-05-19 (×5): qty 0.1

## 2015-05-19 NOTE — Progress Notes (Signed)
[facility] PROGRESS NOTE  Sarah Kelley [id number] DOB: [birth year] DOA: 05/17/2015 PCP: No primary care provider on file.  Assessment/Plan: Diabetic infection of left foot ([place]) and Osteomyelitis of left foot ([place]): X-ray showed osteomyelitis, no need to do MRI.  -Continue with  IV vancomycin and Zosyn per pharmacy -ortho consulted and plan is for amputation on 11/18 or 11/19 -will follow clinical response -no fever and normal WBC's  DM-II:  -A1C 9.8; demonstrating poor diabetes control -will continue SSI and low dose long acting -poor appetite as inpatient  HLD:  -Continue Crestor  -LDL 64; HDL 38 and TG 160  Essential hypertension: -continue Amlodipine  Abdominal pain: New onset. etiology is not exactly clear -CT abdomen/pelvis without contrast negative only showing mild hydronephrosis.  -continue PRN Morphine for pain -normal lipase   AoCKD-IV: Baseline Cre is 1.9 on 12/31/08, her Cre is 2.49 on admisison. Likely due to prerenal secondary to dehydration. -continue IVF's -patient CT abd/pelvis with mild hydronephrosis and distended bladder -foley was inserted -Cr down to 2.04 -will attempt voiding trial after surgical procedure (planned for Friday or saturday)   Code Status: Full code.  Family Communication: care discussed with patient.  Disposition Plan: Remain inpatient for treatment of foot infection and renal failure./    Consultants:  ortho  Procedures: ABI  Antibiotics:  Vancomycin 11-15  Zosyn 11-15  HPI/Subjective: Relates left foot pain for last 2-3 months. Pain is still present, but controlled with current pain meds.  Objective: Filed Vitals:   05/19/15 1028  BP: 150/45  Pulse: 78  Temp: 98.8 F (37.1 C)  Resp: 18    Intake/Output Summary (Last 24 hours) at 05/19/15 1715 Last data filed at 05/19/15 1330  Gross per 24 hour  Intake   2980 ml  Output   2300 ml  Net    680 ml   Filed Weights   05/18/15 0112  05/18/15 2058  Weight: 71.351 kg (157 lb 4.8 oz) 72.077 kg (158 lb 14.4 oz)    Exam:   General:  Alert in no acute distress. Afebrile and just complaining of pain in her left foot  Cardiovascular: S 1, S 2 RRR  Respiratory: CTA bilaterally   Abdomen: bs present, soft, nt  Musculoskeletal: BL extremity with hyperpigmentation (due to chronic venous stasis changes),. third left toe with black discoloration/redness; no drainage.  Data Reviewed: Basic Metabolic Panel:  Recent Labs Lab 05/17/15 1754 05/18/15 0150 05/19/15 0615  NA 141 138 142  K 4.1 3.9 4.1  CL 107 103 111  CO2 23 24 22   GLUCOSE 99 82 141*  BUN 41* 40* 23*  CREATININE 2.49* 2.31* 2.04*  CALCIUM 9.4 9.0 8.7*   Liver Function Tests:  Recent Labs Lab 05/17/15 1754  AST 26  ALT 16  ALKPHOS 90  BILITOT 0.4  PROT 7.5  ALBUMIN 3.4*    Recent Labs Lab 05/18/15 0150  LIPASE 32   CBC:  Recent Labs Lab 05/17/15 1754 05/18/15 0150 05/19/15 0615  WBC 7.5 8.0 6.5  HGB 11.0* 10.5* 10.0*  HCT 34.2* 32.5* 30.3*  MCV 87.7 88.8 86.8  PLT 249 250 210   CBG:  Recent Labs Lab 05/19/15 0052 05/19/15 0440 05/19/15 0751 05/19/15 1201 05/19/15 1658  GLUCAP 215* 131* 155* 263* 175*    Recent Results (from the past 240 hour(s))  Culture, blood (routine x 2)     Status: None (Preliminary result)   Collection Time: 05/18/15  6:53 AM  Result Value Ref Range Status  Specimen Description BLOOD LEFT HAND  Final   Special Requests IN PEDIATRIC BOTTLE  3CC  Final   Culture NO GROWTH 1 DAY  Final   Report Status PENDING  Incomplete  Culture, blood (routine x 2)     Status: None (Preliminary result)   Collection Time: 05/18/15  6:59 AM  Result Value Ref Range Status   Specimen Description BLOOD RIGHT ARM  Final   Special Requests IN PEDIATRIC BOTTLE  3CC  Final   Culture NO GROWTH 1 DAY  Final   Report Status PENDING  Incomplete     Studies: Ct Abdomen Pelvis Wo Contrast  05/18/2015  CLINICAL  DATA:  New onset abdominal pain EXAM: CT ABDOMEN AND PELVIS WITHOUT CONTRAST TECHNIQUE: Multidetector CT imaging of the abdomen and pelvis was performed following the standard protocol without IV contrast. COMPARISON:  None. FINDINGS: Lung bases are free of acute infiltrate or sizable effusion. A calcified granuloma is noted in the right lung base. The gallbladder, liver, spleen, adrenal glands and pancreas are within normal limits. The kidneys show mild fullness of the collecting systems bilaterally. This is likely related to the distended bladder as no definitive ureteral calculi are seen. The bladder is over distended. No filling defect is noted. Small amount of air is noted within the bladder which may be related to recent instrumentation. Clinical correlation is recommended. Diffuse vascular calcifications are noted throughout the uterus. No pelvic mass lesion is noted. No free pelvic fluid is seen. The appendix is not well visualized. No inflammatory changes are identified. Aortoiliac calcifications are noted without aneurysmal dilatation. The osseous structures show no acute abnormality. IMPRESSION: No acute abnormality noted. The bladder is distended with associated mild hydronephrosis bilaterally. Electronically Signed   By: Inez Catalina M.D.   On: 05/18/2015 07:41   Dg Foot 2 Views Left  05/17/2015  CLINICAL DATA:  Chronic left third and fourth toe open wound for 2 months, with pain. Assess for osteomyelitis. Initial encounter. EXAM: LEFT FOOT - 2 VIEW COMPARISON:  None. FINDINGS: There is near complete absence of the third distal phalanx, with vague lucency at the third middle phalanx. This is compatible with osteomyelitis. No definite additional erosions are seen. Soft tissue swelling is noted about the third toe. Visualized joint spaces are otherwise preserved. Small plantar and posterior calcaneal spurs are seen. IMPRESSION: Near complete absence of the third distal phalanx, with vague lucency at  the third middle phalanx. This is compatible with osteomyelitis. Soft tissue swelling about the third toe. Electronically Signed   By: Garald Balding M.D.   On: 05/17/2015 23:00    Scheduled Meds: . amLODipine  5 mg Oral Daily  . dextrose  25 mL Intravenous Once  . heparin  5,000 Units Subcutaneous Once  . heparin subcutaneous  5,000 Units Subcutaneous 3 times per day  . insulin aspart  0-9 Units Subcutaneous 6 times per day  . paricalcitol  1 mcg Oral Daily  . piperacillin-tazobactam (ZOSYN)  IV  3.375 g Intravenous Q8H  . rosuvastatin  10 mg Oral Daily  . vancomycin  750 mg Intravenous Q24H   Continuous Infusions: . sodium chloride 100 mL/hr at 05/18/15 2317    Principal Problem:   Diabetic infection of left foot ([place]) Active Problems:   Diabetes mellitus without complication (H[facility])   HLD (hyperlipidemia)   Essential hypertension   Acute renal failure superimposed on stage 4 chronic kidney disease (H[facility])   Osteomyelitis ([facility])   Abdominal pain   Osteomyelitis of left  foot ([place])    Time spent: 30 minutes.     Barton Dubois  [facility] Pager [phone]. If 7PM-7AM, please contact night-coverage at [email or web address], password [facility] 05/19/2015, 5:15 PM  LOS: 1 day

## 2015-05-19 NOTE — Progress Notes (Signed)
Inpatient Diabetes Program Recommendations  AACE/ADA: New Consensus Statement on Inpatient Glycemic Control (2015)  Target Ranges:  Prepandial:   less than 140 mg/dL      Peak postprandial:   less than 180 mg/dL (1-2 hours)      Critically ill patients:  140 - 180 mg/dL   Review of Glycemic Control:   Results for Sarah Kelley, Sarah Kelley (MRN [id number]) as of 05/19/2015 13:24  Ref. Range 05/18/2015 20:55 05/19/2015 00:52 05/19/2015 04:40 05/19/2015 07:51 05/19/2015 12:01  Glucose-Capillary Latest Ref Range: 65-99 mg/dL 236 (H) 215 (H) 131 (H) 155 (H) 263 (H)    Diabetes history: Diabetes Mellitus Outpatient Diabetes medications: Lantus 35 units bid, Novolog sensitive q 4 hours Current orders for Inpatient glycemic control:  Novolog sensitive q 4 hours-   Inpatient Diabetes Program Recommendations:   Please consider restarting a portion of patient's home dose of Lantus. Consider Lantus 15 units bid.    Thanks, Adah Perl, RN, BC-ADM Inpatient Diabetes Coordinator Pager [phone] (8a-5p)

## 2015-05-19 NOTE — Consult Note (Signed)
Reason for Consult: Osteomyelitis left foot third toe Referring Physician: Dr. Lamar Blinks Sarah Kelley is an 64 y.o. female.  HPI: Patient is a 64 year old woman who is seen for evaluation for ulceration and exposed bone left foot third toe. Patient states the ulceration has been present for months. She states that she has seen several physicians and was referred to [facility] for admission for treatment of the osteomyelitis.  Past Medical History  Diagnosis Date  . Diabetes mellitus   . Hypertension   . Hyperlipidemia   . Renal disorder   . Thyroid disease     History reviewed. No pertinent past surgical history.  History reviewed. No pertinent family history.  Social History:  reports that she has never smoked. She has never used smokeless tobacco. She reports that she does not drink alcohol or use illicit drugs.  Allergies: No Known Allergies  Medications: I have reviewed the patient's current medications.  Results for orders placed or performed during the hospital encounter of 05/17/15 (from the past 48 hour(s))  Comprehensive metabolic panel     Status: Abnormal   Collection Time: 05/17/15  5:54 PM  Result Value Ref Range   Sodium 141 135 - 145 mmol/L   Potassium 4.1 3.5 - 5.1 mmol/L   Chloride 107 101 - 111 mmol/L   CO2 23 22 - 32 mmol/L   Glucose, Bld 99 65 - 99 mg/dL   BUN 41 (H) 6 - 20 mg/dL   Creatinine, Ser 2.49 (H) 0.44 - 1.00 mg/dL   Calcium 9.4 8.9 - 10.3 mg/dL   Total Protein 7.5 6.5 - 8.1 g/dL   Albumin 3.4 (L) 3.5 - 5.0 g/dL   AST 26 15 - 41 U/L   ALT 16 14 - 54 U/L   Alkaline Phosphatase 90 38 - 126 U/L   Total Bilirubin 0.4 0.3 - 1.2 mg/dL   GFR calc non Af Amer 19 (L) >60 mL/min   GFR calc Af Amer 22 (L) >60 mL/min    Comment: (NOTE) The eGFR has been calculated using the CKD EPI equation. This calculation has not been validated in all clinical situations. eGFR's persistently <60 mL/min signify possible Chronic Kidney Disease.    Anion gap 11 5  - 15  CBC     Status: Abnormal   Collection Time: 05/17/15  5:54 PM  Result Value Ref Range   WBC 7.5 4.0 - 10.5 K/uL   RBC 3.90 3.87 - 5.11 MIL/uL   Hemoglobin 11.0 (L) 12.0 - 15.0 g/dL   HCT 34.2 (L) 36.0 - 46.0 %   MCV 87.7 78.0 - 100.0 fL   MCH 28.2 26.0 - 34.0 pg   MCHC 32.2 30.0 - 36.0 g/dL   RDW 13.5 11.5 - 15.5 %   Platelets 249 150 - 400 K/uL  Sedimentation rate     Status: Abnormal   Collection Time: 05/17/15 10:17 PM  Result Value Ref Range   Sed Rate 99 (H) 0 - 22 mm/hr  C-reactive protein     Status: Abnormal   Collection Time: 05/17/15 10:17 PM  Result Value Ref Range   CRP 5.9 (H) <1.0 mg/dL  Glucose, capillary     Status: None   Collection Time: 05/18/15  1:09 AM  Result Value Ref Range   Glucose-Capillary 66 65 - 99 mg/dL  Protime-INR     Status: Abnormal   Collection Time: 05/18/15  1:50 AM  Result Value Ref Range   Prothrombin Time 16.0 (H) 11.6 - 15.2 seconds  INR 1.26 0.00 - 1.49  APTT     Status: Abnormal   Collection Time: 05/18/15  1:50 AM  Result Value Ref Range   aPTT 43 (H) 24 - 37 seconds    Comment:        IF BASELINE aPTT IS ELEVATED, SUGGEST PATIENT RISK ASSESSMENT BE USED TO DETERMINE APPROPRIATE ANTICOAGULANT THERAPY.   Type and screen [place]     Status: None   Collection Time: 05/18/15  1:50 AM  Result Value Ref Range   ABO/RH(D) B POS    Antibody Screen NEG    Sample Expiration 05/21/2015   Lipase, blood     Status: None   Collection Time: 05/18/15  1:50 AM  Result Value Ref Range   Lipase 32 11 - 51 U/L  Hemoglobin A1c     Status: Abnormal   Collection Time: 05/18/15  1:50 AM  Result Value Ref Range   Hgb A1c MFr Bld 9.8 (H) 4.8 - 5.6 %    Comment: (NOTE)         Pre-diabetes: 5.7 - 6.4         Diabetes: >6.4         Glycemic control for adults with diabetes: <7.0    Mean Plasma Glucose 235 mg/dL    Comment: (NOTE) Performed At: [facility] [place], [place]  [id number] Lindon Romp MD [id number]   Basic metabolic panel     Status: Abnormal   Collection Time: 05/18/15  1:50 AM  Result Value Ref Range   Sodium 138 135 - 145 mmol/L   Potassium 3.9 3.5 - 5.1 mmol/L   Chloride 103 101 - 111 mmol/L   CO2 24 22 - 32 mmol/L   Glucose, Bld 82 65 - 99 mg/dL   BUN 40 (H) 6 - 20 mg/dL   Creatinine, Ser 2.31 (H) 0.44 - 1.00 mg/dL   Calcium 9.0 8.9 - 10.3 mg/dL   GFR calc non Af Amer 21 (L) >60 mL/min   GFR calc Af Amer 24 (L) >60 mL/min    Comment: (NOTE) The eGFR has been calculated using the CKD EPI equation. This calculation has not been validated in all clinical situations. eGFR's persistently <60 mL/min signify possible Chronic Kidney Disease.    Anion gap 11 5 - 15  CBC     Status: Abnormal   Collection Time: 05/18/15  1:50 AM  Result Value Ref Range   WBC 8.0 4.0 - 10.5 K/uL   RBC 3.66 (L) 3.87 - 5.11 MIL/uL   Hemoglobin 10.5 (L) 12.0 - 15.0 g/dL   HCT 32.5 (L) 36.0 - 46.0 %   MCV 88.8 78.0 - 100.0 fL   MCH 28.7 26.0 - 34.0 pg   MCHC 32.3 30.0 - 36.0 g/dL   RDW 13.5 11.5 - 15.5 %   Platelets 250 150 - 400 K/uL  Lipid panel     Status: Abnormal   Collection Time: 05/18/15  1:50 AM  Result Value Ref Range   Cholesterol 134 0 - 200 mg/dL   Triglycerides 160 (H) <150 mg/dL   HDL 38 (L) >40 mg/dL   Total CHOL/HDL Ratio 3.5 RATIO   VLDL 32 0 - 40 mg/dL   LDL Cholesterol 64 0 - 99 mg/dL    Comment:        Total Cholesterol/HDL:CHD Risk Coronary Heart Disease Risk Table  Men   Women  1/2 Average Risk   3.4   3.3  Average Risk       5.0   4.4  2 X Average Risk   9.6   7.1  3 X Average Risk  23.4   11.0        Use the calculated Patient Ratio above and the CHD Risk Table to determine the patient's CHD Risk.        ATP III CLASSIFICATION (LDL):  <100     mg/dL   Optimal  100-129  mg/dL   Near or Above                    Optimal  130-159  mg/dL   Borderline  160-189  mg/dL   High  >190     mg/dL    Very High   ABO/Rh     Status: None   Collection Time: 05/18/15  1:50 AM  Result Value Ref Range   ABO/RH(D) B POS   Glucose, capillary     Status: None   Collection Time: 05/18/15  1:54 AM  Result Value Ref Range   Glucose-Capillary 71 65 - 99 mg/dL  Creatinine, urine, random     Status: None   Collection Time: 05/18/15  3:30 AM  Result Value Ref Range   Creatinine, Urine 51.52 mg/dL  Sodium, urine, random     Status: None   Collection Time: 05/18/15  3:30 AM  Result Value Ref Range   Sodium, Ur 77 mmol/L  Glucose, capillary     Status: None   Collection Time: 05/18/15  4:35 AM  Result Value Ref Range   Glucose-Capillary 81 65 - 99 mg/dL  Glucose, capillary     Status: None   Collection Time: 05/18/15  8:34 AM  Result Value Ref Range   Glucose-Capillary 68 65 - 99 mg/dL  Glucose, capillary     Status: Abnormal   Collection Time: 05/18/15  9:35 AM  Result Value Ref Range   Glucose-Capillary 131 (H) 65 - 99 mg/dL  Glucose, capillary     Status: None   Collection Time: 05/18/15 11:43 AM  Result Value Ref Range   Glucose-Capillary 94 65 - 99 mg/dL  Glucose, capillary     Status: Abnormal   Collection Time: 05/18/15  5:30 PM  Result Value Ref Range   Glucose-Capillary 157 (H) 65 - 99 mg/dL  Glucose, capillary     Status: Abnormal   Collection Time: 05/18/15  8:55 PM  Result Value Ref Range   Glucose-Capillary 236 (H) 65 - 99 mg/dL  Glucose, capillary     Status: Abnormal   Collection Time: 05/19/15 12:52 AM  Result Value Ref Range   Glucose-Capillary 215 (H) 65 - 99 mg/dL  Glucose, capillary     Status: Abnormal   Collection Time: 05/19/15  4:40 AM  Result Value Ref Range   Glucose-Capillary 131 (H) 65 - 99 mg/dL  CBC     Status: Abnormal   Collection Time: 05/19/15  6:15 AM  Result Value Ref Range   WBC 6.5 4.0 - 10.5 K/uL   RBC 3.49 (L) 3.87 - 5.11 MIL/uL   Hemoglobin 10.0 (L) 12.0 - 15.0 g/dL   HCT 30.3 (L) 36.0 - 46.0 %   MCV 86.8 78.0 - 100.0 fL   MCH 28.7  26.0 - 34.0 pg   MCHC 33.0 30.0 - 36.0 g/dL   RDW 13.3 11.5 - 15.5 %   Platelets 210  150 - 400 K/uL  Basic metabolic panel     Status: Abnormal   Collection Time: 05/19/15  6:15 AM  Result Value Ref Range   Sodium 142 135 - 145 mmol/L   Potassium 4.1 3.5 - 5.1 mmol/L   Chloride 111 101 - 111 mmol/L   CO2 22 22 - 32 mmol/L   Glucose, Bld 141 (H) 65 - 99 mg/dL   BUN 23 (H) 6 - 20 mg/dL   Creatinine, Ser 2.04 (H) 0.44 - 1.00 mg/dL   Calcium 8.7 (L) 8.9 - 10.3 mg/dL   GFR calc non Af Amer 24 (L) >60 mL/min   GFR calc Af Amer 28 (L) >60 mL/min    Comment: (NOTE) The eGFR has been calculated using the CKD EPI equation. This calculation has not been validated in all clinical situations. eGFR's persistently <60 mL/min signify possible Chronic Kidney Disease.    Anion gap 9 5 - 15  Glucose, capillary     Status: Abnormal   Collection Time: 05/19/15  7:51 AM  Result Value Ref Range   Glucose-Capillary 155 (H) 65 - 99 mg/dL  Glucose, capillary     Status: Abnormal   Collection Time: 05/19/15 12:01 PM  Result Value Ref Range   Glucose-Capillary 263 (H) 65 - 99 mg/dL    Ct Abdomen Pelvis Wo Contrast  05/18/2015  CLINICAL DATA:  New onset abdominal pain EXAM: CT ABDOMEN AND PELVIS WITHOUT CONTRAST TECHNIQUE: Multidetector CT imaging of the abdomen and pelvis was performed following the standard protocol without IV contrast. COMPARISON:  None. FINDINGS: Lung bases are free of acute infiltrate or sizable effusion. A calcified granuloma is noted in the right lung base. The gallbladder, liver, spleen, adrenal glands and pancreas are within normal limits. The kidneys show mild fullness of the collecting systems bilaterally. This is likely related to the distended bladder as no definitive ureteral calculi are seen. The bladder is over distended. No filling defect is noted. Small amount of air is noted within the bladder which may be related to recent instrumentation. Clinical correlation is  recommended. Diffuse vascular calcifications are noted throughout the uterus. No pelvic mass lesion is noted. No free pelvic fluid is seen. The appendix is not well visualized. No inflammatory changes are identified. Aortoiliac calcifications are noted without aneurysmal dilatation. The osseous structures show no acute abnormality. IMPRESSION: No acute abnormality noted. The bladder is distended with associated mild hydronephrosis bilaterally. Electronically Signed   By: Inez Catalina M.D.   On: 05/18/2015 07:41   Dg Foot 2 Views Left  05/17/2015  CLINICAL DATA:  Chronic left third and fourth toe open wound for 2 months, with pain. Assess for osteomyelitis. Initial encounter. EXAM: LEFT FOOT - 2 VIEW COMPARISON:  None. FINDINGS: There is near complete absence of the third distal phalanx, with vague lucency at the third middle phalanx. This is compatible with osteomyelitis. No definite additional erosions are seen. Soft tissue swelling is noted about the third toe. Visualized joint spaces are otherwise preserved. Small plantar and posterior calcaneal spurs are seen. IMPRESSION: Near complete absence of the third distal phalanx, with vague lucency at the third middle phalanx. This is compatible with osteomyelitis. Soft tissue swelling about the third toe. Electronically Signed   By: Garald Balding M.D.   On: 05/17/2015 23:00    Review of Systems  All other systems reviewed and are negative.  Blood pressure 150/45, pulse 78, temperature 98.8 F (37.1 C), temperature source Oral, resp. rate 18, height _0  (  1.676 m), weight 72.077 kg (158 lb 14.4 oz), SpO2 98 %. Physical Exam On examination patient has a warm lower extremities bilaterally. She has a faintly palpable dorsalis pedis pulse. Her radiographs are reviewed which shows complete destructive changes of the tuft of the third toe left foot secondary to osteomyelitis. There is mild ischemic changes to the third toe she does not have protective sensation  there is no ascending cellulitis there is no purulent drainage. 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  Plan: We will plan for a left foot third Ray amputation. We'll schedule this for Friday or Saturday. Patient could not tolerate the blood pressure cuffs for an ankle-brachial indices and if patient is having trouble healing this amputation would need to schedule her for vascular vein consultation.  DUDA,MARCUS V 05/19/2015, 12:52 PM

## 2015-05-20 LAB — GLUCOSE, CAPILLARY
GLUCOSE-CAPILLARY: 178 mg/dL — AB (ref 65–99)
GLUCOSE-CAPILLARY: 125 mg/dL — AB (ref 65–99)
GLUCOSE-CAPILLARY: 189 mg/dL — AB (ref 65–99)
Glucose-Capillary: 194 mg/dL — ABNORMAL HIGH (ref 65–99)
Glucose-Capillary: 276 mg/dL — ABNORMAL HIGH (ref 65–99)

## 2015-05-20 NOTE — Progress Notes (Signed)
Inpatient Diabetes Program Recommendations  AACE/ADA: New Consensus Statement on Inpatient Glycemic Control (2015)  Target Ranges:  Prepandial:   less than 140 mg/dL      Peak postprandial:   less than 180 mg/dL (1-2 hours)      Critically ill patients:  140 - 180 mg/dL    Results for BRESHA, VILE (MRN [id number]) as of 05/20/2015 11:15  Ref. Range 05/19/2015 00:52 05/19/2015 04:40 05/19/2015 07:51 05/19/2015 12:01 05/19/2015 16:58 05/19/2015 20:06  Glucose-Capillary Latest Ref Range: 65-99 mg/dL 215 (H) 131 (H) 155 (H) 263 (H) 175 (H) 193 (H)   Results for LOELLA, FORINO (MRN [id number]) as of 05/20/2015 11:15  Ref. Range 05/19/2015 23:47 05/20/2015 03:52 05/20/2015 07:29  Glucose-Capillary Latest Ref Range: 65-99 mg/dL 172 (H) 178 (H) 125 (H)    Home DM Meds: Lantus 35 units bid       Januvia 50 mg daily  Current Insulin Orders: Lantus 10 units QHS      Novolog Sensitive SSI Q4 hours     MD- Per I&O Flowsheet, patient is consuming 100% of meals.  Please consider the following in-hospital insulin adjustments:  1. Change Novolog Sensitive SSI (0-9 units) to TID AC + HS (currently ordered as Q4 hours)  2. Start low dose Novolog Meal Coverage- Novolog 3 units tidwc     --Will follow patient during hospitalization--  Wyn Quaker RN, MSN, CDE Diabetes Coordinator Inpatient Glycemic Control Team Team Pager: [phone] (8a-5p)

## 2015-05-20 NOTE — Progress Notes (Addendum)
ANTIBIOTIC CONSULT NOTE  Pharmacy Consult for Vancomycin and Zosyn  Indication: cellulitis / osteo  No Known Allergies   Labs:  Recent Labs  05/17/15 1754 05/18/15 0150 05/18/15 0330 05/19/15 0615  WBC 7.5 8.0  --  6.5  HGB 11.0* 10.5*  --  10.0*  PLT 249 250  --  210  LABCREA  --   --  51.52  --   CREATININE 2.49* 2.31*  --  2.04*   Estimated Creatinine Clearance: 27.5 mL/min (by C-G formula based on Cr of 2.04). No results for input(s): VANCOTROUGH, VANCOPEAK, VANCORANDOM, GENTTROUGH, GENTPEAK, GENTRANDOM, TOBRATROUGH, TOBRAPEAK, TOBRARND, AMIKACINPEAK, AMIKACINTROU, AMIKACIN in the last 72 hours.    Assessment: 64 year old female continues on broad spectrum antibiotics for left foot infection/osteomyelitis Awaiting toe amputation 11/18 or 11/19 Afebrile, WBC WNL Blood cultures negative to date Scr stable - trending down  Goal of Therapy:  Vancomycin trough = 15 to 20 mcg / dL Appropriate Zosyn dosing  Plan:  Continue Zosyn 3.375 grams iv Q 8 hours (4 hr infusion) Continue Vancomycin 750 mg iv Q 24 hours  No plans for vancomycin trough level with plans to stop antibiotics after amputation.  Thank you Anette Guarneri, PharmD [phone]  05/20/2015,11:38 AM

## 2015-05-20 NOTE — Progress Notes (Signed)
Physical Therapy Treatment Patient Details Name: Sarah Kelley MRN: [id number] DOB: August 17, 1950 Today's Date: 2015/05/30    History of Present Illness 64 yo female admitted with osteomyelitis with infection 3rd toe L foot. PMH: HTN, Hyperlipidemia, DM, CKD IV    PT Comments    Patient tolerating increased ambulation this session.  Still with pain both feet, but going to bathroom with family assist.  Will check back after surgery.  Follow Up Recommendations  No PT follow up;Supervision/Assistance - 24 hour     Equipment Recommendations  None recommended by PT    Recommendations for Other Services       Precautions / Restrictions Precautions Precautions: Fall Required Braces or Orthoses: Other Brace/Splint Other Brace/Splint: post op shoe L     Mobility  Bed Mobility   Bed Mobility: Supine to Sit;Sit to Supine     Supine to sit: Min assist Sit to supine: Min assist   General bed mobility comments: assist for moving legs  Transfers Overall transfer level: Needs assistance Equipment used: Rolling walker (2 wheeled)   Sit to Stand: Min assist         General transfer comment: daughter in room and pt had just walked to/from bathroom.  Daughter translating when needed  Ambulation/Gait Ambulation/Gait assistance: Min assist Ambulation Distance (Feet): 40 Feet Assistive device: Rolling walker (2 wheeled) Gait Pattern/deviations: Step-through pattern;Decreased stride length;Antalgic     General Gait Details: slow and with short shuffling steps   Stairs            Wheelchair Mobility    Modified Rankin (Stroke Patients Only)       Balance Overall balance assessment: Needs assistance           Standing balance-Leahy Scale: Fair Standing balance comment: can stand unsupported, but needs UE assist for ambulation                    Cognition Arousal/Alertness: Awake/alert Behavior During Therapy: WFL for tasks  assessed/performed Overall Cognitive Status: Within Functional Limits for tasks assessed                      Exercises      General Comments General comments (skin integrity, edema, etc.): spouse and daughter present during session and report surgery planned for tomorrow or Saturday      Pertinent Vitals/Pain Pain Assessment: Faces Faces Pain Scale: Hurts little more Pain Location: feet Pain Descriptors / Indicators: Aching Pain Intervention(s): Limited activity within patient's tolerance;Monitored during session;Repositioned    Home Living                      Prior Function            PT Goals (current goals can now be found in the care plan section) Progress towards PT goals: Progressing toward goals    Frequency  Min 3X/week    PT Plan Current plan remains appropriate    Co-evaluation             End of Session Equipment Utilized During Treatment: Gait belt Activity Tolerance: Patient tolerated treatment well Patient left: in bed;with call bell/phone within reach;with family/visitor present     Time: 1430-1450 PT Time Calculation (min) (ACUTE ONLY): 20 min  Charges:  $Gait Training: 8-22 mins                    G Codes:      Bradleigh Sonnen,CYNDI 05/30/2015, 3:08 PM  [place], [place] 05/20/2015

## 2015-05-20 NOTE — Progress Notes (Signed)
[facility] PROGRESS NOTE  Corra Bench [id number] DOB: 30-Apr-1951 DOA: 05/17/2015 PCP: No primary care provider on file.  Assessment/Plan: Diabetic infection of left foot ([place]) and Osteomyelitis of left foot ([place]): X-ray showed osteomyelitis, no need to do MRI.  -Continue with  IV vancomycin and Zosyn per pharmacy -ortho consulted and plan is for amputation on 11/18 or 11/19 -will follow clinical response and further rec's -no fever and normal WBC's  DM-II:  -A1C 9.8; demonstrating poor diabetes control -will continue SSI and low dose long acting -poor appetite as inpatient  HLD:  -Continue Crestor  -LDL 64; HDL 38 and TG 160  Essential hypertension: -continue Amlodipine  Abdominal pain: New onset. etiology is not exactly clear -CT abdomen/pelvis without contrast negative, except for only mild hydronephrosis.  -continue PRN Morphine for pain -normal lipase   AoCKD-IV: Baseline Cre is 1.9 on 12/31/08, her Cre is 2.49 on admisison. Likely due to prerenal secondary to dehydration. -continue IVF's -patient CT abd/pelvis with just mild hydronephrosis and distended bladder -foley was inserted -Cr trending down  -will follow BMET in am -will attempt voiding trial after surgical procedure (planned for Friday or saturday)   Code Status: Full code.  Family Communication: care discussed with patient.  Disposition Plan: Remain inpatient for treatment of foot infection and renal failure. Plan is for amputation on third left toe on fir or Sat   Consultants:  ortho  Procedures: LE duplex: no evidence of DVT or baker cyst's  Antibiotics:  Vancomycin 11-15  Zosyn 11-15  HPI/Subjective: Relates left foot pain for last 2-3 months. Pain is still present, but better and well controlled with current pain meds. In agreement and on board for amputation surgery   Objective: Filed Vitals:   05/20/15 0855  BP: 120/47  Pulse: 57  Temp: 98.5 F (36.9 C)  Resp:  18    Intake/Output Summary (Last 24 hours) at 05/20/15 1204 Last data filed at 05/20/15 0855  Gross per 24 hour  Intake 2483.33 ml  Output   2325 ml  Net 158.33 ml   Filed Weights   05/18/15 0112 05/18/15 2058 05/19/15 2009  Weight: 71.351 kg (157 lb 4.8 oz) 72.077 kg (158 lb 14.4 oz) 71.668 kg (158 lb)    Exam:   General:  Alert in no acute distress. Afebrile and reports she is just waiting for surgical procedure. No CP or SOB  Cardiovascular: S 1, S 2 RRR  Respiratory: CTA bilaterally   Abdomen: bs present, soft, nt  Musculoskeletal: BL extremity with hyperpigmentation (due to chronic venous stasis changes),. third left toe with black discoloration/redness; no drainage.  Data Reviewed: Basic Metabolic Panel:  Recent Labs Lab 05/17/15 1754 05/18/15 0150 05/19/15 0615  NA 141 138 142  K 4.1 3.9 4.1  CL 107 103 111  CO2 23 24 22   GLUCOSE 99 82 141*  BUN 41* 40* 23*  CREATININE 2.49* 2.31* 2.04*  CALCIUM 9.4 9.0 8.7*   Liver Function Tests:  Recent Labs Lab 05/17/15 1754  AST 26  ALT 16  ALKPHOS 90  BILITOT 0.4  PROT 7.5  ALBUMIN 3.4*    Recent Labs Lab 05/18/15 0150  LIPASE 32   CBC:  Recent Labs Lab 05/17/15 1754 05/18/15 0150 05/19/15 0615  WBC 7.5 8.0 6.5  HGB 11.0* 10.5* 10.0*  HCT 34.2* 32.5* 30.3*  MCV 87.7 88.8 86.8  PLT 249 250 210   CBG:  Recent Labs Lab 05/19/15 2006 05/19/15 2347 05/20/15 0352 05/20/15 0729 05/20/15 1155  GLUCAP 193* 172* 178* 125* 189*    Recent Results (from the past 240 hour(s))  Culture, blood (routine x 2)     Status: None (Preliminary result)   Collection Time: 05/18/15  6:53 AM  Result Value Ref Range Status   Specimen Description BLOOD LEFT HAND  Final   Special Requests IN PEDIATRIC BOTTLE  3CC  Final   Culture NO GROWTH 1 DAY  Final   Report Status PENDING  Incomplete  Culture, blood (routine x 2)     Status: None (Preliminary result)   Collection Time: 05/18/15  6:59 AM  Result  Value Ref Range Status   Specimen Description BLOOD RIGHT ARM  Final   Special Requests IN PEDIATRIC BOTTLE  3CC  Final   Culture NO GROWTH 1 DAY  Final   Report Status PENDING  Incomplete     Studies: No results found.  Scheduled Meds: . amLODipine  5 mg Oral Daily  . dextrose  25 mL Intravenous Once  . heparin  5,000 Units Subcutaneous Once  . heparin subcutaneous  5,000 Units Subcutaneous 3 times per day  . insulin aspart  0-9 Units Subcutaneous 6 times per day  . insulin glargine  10 Units Subcutaneous QHS  . paricalcitol  1 mcg Oral Daily  . piperacillin-tazobactam (ZOSYN)  IV  3.375 g Intravenous Q8H  . rosuvastatin  10 mg Oral Daily  . vancomycin  750 mg Intravenous Q24H   Continuous Infusions: . sodium chloride 100 mL/hr at 05/20/15 0356    Principal Problem:   Diabetic infection of left foot ([place]) Active Problems:   Diabetes mellitus without complication (HCC)   HLD (hyperlipidemia)   Essential hypertension   Acute renal failure superimposed on stage 4 chronic kidney disease (HCC)   Osteomyelitis (HCC)   Abdominal pain   Osteomyelitis of left foot ([place])    Time spent: 25 minutes.     Barton Dubois  [facility] Pager [phone]. If 7PM-7AM, please contact night-coverage at [email or web address], password [facility] 05/20/2015, 12:04 PM  LOS: 2 days

## 2015-05-20 NOTE — Progress Notes (Signed)
Orthopedics Progress Note  Subjective: Patient still reporting toe and foot pain. She understands the need for ray amputation.  Objective:  Filed Vitals:   05/20/15 0855  BP: 120/47  Pulse: 57  Temp: 98.5 F (36.9 C)  Resp: 18    General: Awake and alert  Musculoskeletal: L foot 3rd toe dusky and with dry gangrene, remaining toes well perfused Neurovascularly intact  Lab Results  Component Value Date   WBC 6.5 05/19/2015   HGB 10.0* 05/19/2015   HCT 30.3* 05/19/2015   MCV 86.8 05/19/2015   PLT 210 05/19/2015       Component Value Date/Time   NA 142 05/19/2015 0615   K 4.1 05/19/2015 0615   CL 111 05/19/2015 0615   CO2 22 05/19/2015 0615   GLUCOSE 141* 05/19/2015 0615   BUN 23* 05/19/2015 0615   CREATININE 2.04* 05/19/2015 0615   CALCIUM 8.7* 05/19/2015 0615   GFRNONAA 24* 05/19/2015 0615   GFRAA 28* 05/19/2015 0615    Lab Results  Component Value Date   INR 1.26 05/18/2015    Assessment/Plan: Patient with diabetic infection and osteo of the 3rd toe left foot.  She requires: Left Foot 3rd Ray Amputation planned by Dr Sharol Given.  I appreciate Dr Jess Barters expertise and care for Sarah Kelley. Continue IV antibiotics  Doran Heater. Veverly Fells, MD 05/20/2015 4:10 PM

## 2015-05-21 ENCOUNTER — Inpatient Hospital Stay (HOSPITAL_COMMUNITY): Payer: Medicaid Other | Admitting: Anesthesiology

## 2015-05-21 ENCOUNTER — Encounter (HOSPITAL_COMMUNITY)
Admission: EM | Disposition: A | Discharge status: Home / Self Care | Payer: Self-pay | Source: Home / Self Care | Attending: Internal Medicine

## 2015-05-21 HISTORY — PX: AMPUTATION: SHX166

## 2015-05-21 LAB — BASIC METABOLIC PANEL
Anion gap: 7 (ref 5–15)
BUN: 18 mg/dL (ref 6–20)
CO2: 22 mmol/L (ref 22–32)
CREATININE: 2.3 mg/dL — AB (ref 0.44–1.00)
Calcium: 8.5 mg/dL — ABNORMAL LOW (ref 8.9–10.3)
Chloride: 109 mmol/L (ref 101–111)
GFR calc Af Amer: 24 mL/min — ABNORMAL LOW (ref >60)
GFR, EST NON AFRICAN AMERICAN: 21 mL/min — AB (ref >60)
Glucose, Bld: 172 mg/dL — ABNORMAL HIGH (ref 65–99)
POTASSIUM: 4.1 mmol/L (ref 3.5–5.1)
SODIUM: 138 mmol/L (ref 135–145)

## 2015-05-21 LAB — GLUCOSE, CAPILLARY
GLUCOSE-CAPILLARY: 105 mg/dL — AB (ref 65–99)
GLUCOSE-CAPILLARY: 109 mg/dL — AB (ref 65–99)
GLUCOSE-CAPILLARY: 126 mg/dL — AB (ref 65–99)
GLUCOSE-CAPILLARY: 231 mg/dL — AB (ref 65–99)
Glucose-Capillary: 182 mg/dL — ABNORMAL HIGH (ref 65–99)
Glucose-Capillary: 121 mg/dL — ABNORMAL HIGH (ref 65–99)
Glucose-Capillary: 116 mg/dL — ABNORMAL HIGH (ref 65–99)
Glucose-Capillary: 115 mg/dL — ABNORMAL HIGH (ref 65–99)

## 2015-05-21 LAB — MRSA PCR SCREENING: MRSA BY PCR: NEGATIVE

## 2015-05-21 SURGERY — AMPUTATION, FOOT, RAY
Anesthesia: General | Site: Foot | Laterality: Left

## 2015-05-21 MED ORDER — METHOCARBAMOL 1000 MG/10ML IJ SOLN
500.0000 mg | Freq: Four times a day (QID) | INTRAMUSCULAR | Status: DC | PRN
  Filled 2015-05-21: qty 5

## 2015-05-21 MED ORDER — 0.9 % SODIUM CHLORIDE (POUR BTL) OPTIME
TOPICAL | Status: DC | PRN
  Administered 2015-05-21: 1000 mL

## 2015-05-21 MED ORDER — ONDANSETRON HCL 4 MG/2ML IJ SOLN
INTRAMUSCULAR | Status: DC | PRN
  Administered 2015-05-21: 4 mg via INTRAVENOUS

## 2015-05-21 MED ORDER — METOCLOPRAMIDE HCL 5 MG PO TABS: 5.0000 mg | ORAL_TABLET | Freq: Three times a day (TID) | ORAL | Status: DC | PRN

## 2015-05-21 MED ORDER — ACETAMINOPHEN 650 MG RE SUPP: 650.0000 mg | Freq: Four times a day (QID) | RECTAL | Status: DC | PRN

## 2015-05-21 MED ORDER — METOCLOPRAMIDE HCL 5 MG/ML IJ SOLN: 5.0000 mg | Freq: Three times a day (TID) | INTRAMUSCULAR | Status: DC | PRN

## 2015-05-21 MED ORDER — FENTANYL CITRATE (PF) 100 MCG/2ML IJ SOLN
INTRAMUSCULAR | Status: DC | PRN
  Administered 2015-05-21: 100 ug via INTRAVENOUS

## 2015-05-21 MED ORDER — FENTANYL CITRATE (PF) 250 MCG/5ML IJ SOLN
INTRAMUSCULAR | Status: AC
  Filled 2015-05-21: qty 5

## 2015-05-21 MED ORDER — ACETAMINOPHEN 325 MG PO TABS: 650.0000 mg | ORAL_TABLET | Freq: Four times a day (QID) | ORAL | Status: DC | PRN

## 2015-05-21 MED ORDER — SODIUM CHLORIDE 0.9 % IV SOLN
INTRAVENOUS | Status: DC
  Administered 2015-05-21: 23:00:00 via INTRAVENOUS

## 2015-05-21 MED ORDER — HYDROMORPHONE HCL 1 MG/ML IJ SOLN: 0.2500 mg | INTRAMUSCULAR | Status: DC | PRN

## 2015-05-21 MED ORDER — METHOCARBAMOL 500 MG PO TABS
500.0000 mg | ORAL_TABLET | Freq: Four times a day (QID) | ORAL | Status: DC | PRN
  Administered 2015-05-21: 500 mg via ORAL
  Filled 2015-05-21: qty 1

## 2015-05-21 MED ORDER — ONDANSETRON HCL 4 MG/2ML IJ SOLN: 4.0000 mg | Freq: Four times a day (QID) | INTRAMUSCULAR | Status: DC | PRN

## 2015-05-21 MED ORDER — PROPOFOL 10 MG/ML IV BOLUS
INTRAVENOUS | Status: DC | PRN
  Administered 2015-05-21: 200 mg via INTRAVENOUS

## 2015-05-21 MED ORDER — LIDOCAINE HCL (CARDIAC) 20 MG/ML IV SOLN
INTRAVENOUS | Status: DC | PRN
  Administered 2015-05-21: 100 mg via INTRAVENOUS

## 2015-05-21 MED ORDER — INSULIN ASPART 100 UNIT/ML ~~LOC~~ SOLN
0.0000 [IU] | Freq: Three times a day (TID) | SUBCUTANEOUS | Status: DC
  Administered 2015-05-22 (×2): 2 [IU] via SUBCUTANEOUS
  Administered 2015-05-22: 1 [IU] via SUBCUTANEOUS
  Administered 2015-05-23: 2 [IU] via SUBCUTANEOUS
  Administered 2015-05-23: 3 [IU] via SUBCUTANEOUS
  Administered 2015-05-23: 1 [IU] via SUBCUTANEOUS

## 2015-05-21 MED ORDER — ONDANSETRON HCL 4 MG PO TABS: 4.0000 mg | ORAL_TABLET | Freq: Four times a day (QID) | ORAL | Status: DC | PRN

## 2015-05-21 MED ORDER — SODIUM CHLORIDE 0.9 % IV SOLN
INTRAVENOUS | Status: DC
  Administered 2015-05-21: 18:00:00 via INTRAVENOUS

## 2015-05-21 SURGICAL SUPPLY — 30 items
BLADE SAW SGTL MED 73X18.5 STR (BLADE) IMPLANT
BNDG COHESIVE 4X5 TAN STRL (GAUZE/BANDAGES/DRESSINGS) ×3 IMPLANT
BNDG GAUZE ELAST 4 BULKY (GAUZE/BANDAGES/DRESSINGS) ×3 IMPLANT
COVER SURGICAL LIGHT HANDLE (MISCELLANEOUS) ×6 IMPLANT
DRAPE U-SHAPE 47X51 STRL (DRAPES) ×6 IMPLANT
DRSG ADAPTIC 3X8 NADH LF (GAUZE/BANDAGES/DRESSINGS) ×3 IMPLANT
DRSG PAD ABDOMINAL 8X10 ST (GAUZE/BANDAGES/DRESSINGS) ×6 IMPLANT
DURAPREP 26ML APPLICATOR (WOUND CARE) ×3 IMPLANT
ELECT REM PT RETURN 9FT ADLT (ELECTROSURGICAL) ×3
ELECTRODE REM PT RTRN 9FT ADLT (ELECTROSURGICAL) ×1 IMPLANT
GAUZE SPONGE 4X4 12PLY STRL (GAUZE/BANDAGES/DRESSINGS) ×3 IMPLANT
GLOVE BIOGEL PI IND STRL 9 (GLOVE) ×1 IMPLANT
GLOVE BIOGEL PI INDICATOR 9 (GLOVE) ×2
GLOVE SURG ORTHO 9.0 STRL STRW (GLOVE) ×3 IMPLANT
GOWN STRL REUS W/ TWL LRG LVL3 (GOWN DISPOSABLE) ×1 IMPLANT
GOWN STRL REUS W/ TWL XL LVL3 (GOWN DISPOSABLE) ×2 IMPLANT
GOWN STRL REUS W/TWL LRG LVL3 (GOWN DISPOSABLE) ×3
GOWN STRL REUS W/TWL XL LVL3 (GOWN DISPOSABLE) ×6
KIT BASIN OR (CUSTOM PROCEDURE TRAY) ×3 IMPLANT
KIT ROOM TURNOVER OR (KITS) ×3 IMPLANT
NS IRRIG 1000ML POUR BTL (IV SOLUTION) ×3 IMPLANT
PACK ORTHO EXTREMITY (CUSTOM PROCEDURE TRAY) ×3 IMPLANT
PAD ARMBOARD 7.5X6 YLW CONV (MISCELLANEOUS) ×6 IMPLANT
SPONGE LAP 18X18 X RAY DECT (DISPOSABLE) ×3 IMPLANT
STOCKINETTE IMPERVIOUS LG (DRAPES) IMPLANT
SUT ETHILON 2 0 PSLX (SUTURE) ×6 IMPLANT
TOWEL OR 17X24 6PK STRL BLUE (TOWEL DISPOSABLE) ×3 IMPLANT
TOWEL OR 17X26 10 PK STRL BLUE (TOWEL DISPOSABLE) ×3 IMPLANT
UNDERPAD 30X30 INCONTINENT (UNDERPADS AND DIAPERS) ×3 IMPLANT
WATER STERILE IRR 1000ML POUR (IV SOLUTION) ×3 IMPLANT

## 2015-05-21 NOTE — Progress Notes (Signed)
[facility] PROGRESS NOTE  Sarah Kelley [id number] DOB: January 12, 1951 DOA: 05/17/2015 PCP: No primary care provider on file.  Assessment/Plan: Diabetic infection of left foot ([place]) and Osteomyelitis of left foot ([place]): X-ray showed osteomyelitis, no need to do MRI.  -Continue with  IV vancomycin and Zosyn per pharmacy -ortho consulted and plan is for amputation on 11/18 afternoon -will follow clinical response and further rec's -no fever and normal WBC's  DM-II:  -A1C 9.8; demonstrating poor diabetes control -will continue SSI and low dose long acting -poor appetite as inpatient -patient educated about low carb diet and medication compliance at discharge  HLD:  -Continue Crestor  -LDL 64; HDL 38 and TG 160  Essential hypertension: -continue Amlodipine  Abdominal pain: New onset. etiology is not exactly clear -CT abdomen/pelvis without contrast negative, except for only mild hydronephrosis.  -continue PRN Morphine for pain -normal lipase   AoCKD-IV: Baseline Cre is 1.9 on 12/31/08, her Cre is 2.49 on admisison. Likely due to prerenal secondary to dehydration. -continue IVF's -patient CT abd/pelvis with just mild hydronephrosis and distended bladder -foley was inserted -Cr trending down  -will follow BMET in am -will attempt voiding trial after surgical procedure (planned for today afternoon, 11/18)   Code Status: Full code.  Family Communication: care discussed with patient.  Disposition Plan: Remain inpatient for treatment of foot infection and renal failure. Plan is for amputation on third left toe on fir or Sat   Consultants:  ortho  Procedures: LE duplex: no evidence of DVT or baker cyst's  Antibiotics:  Vancomycin 11-15  Zosyn 11-15  HPI/Subjective: Relates left foot pain for last 2-3 months. Pain is well controlled. Slightly concern about surgery but ready for it. No fever  Objective: Filed Vitals:   05/21/15 1029  BP: 129/50   Pulse: 62  Temp: 98.4 F (36.9 C)  Resp: 18    Intake/Output Summary (Last 24 hours) at 05/21/15 1648 Last data filed at 05/21/15 1521  Gross per 24 hour  Intake      0 ml  Output   1700 ml  Net  -1700 ml   Filed Weights   05/18/15 2058 05/19/15 2009 05/20/15 2019  Weight: 72.077 kg (158 lb 14.4 oz) 71.668 kg (158 lb) 71.2 kg (156 lb 15.5 oz)    Exam:   General:  Alert in no acute distress. Afebrile and denies any complaints. No CP or SOB. Surgery today afternoon (11/18)  Cardiovascular: S 1, S 2 RRR  Respiratory: CTA bilaterally   Abdomen: bs present, soft, nt  Musculoskeletal: BL extremity with hyperpigmentation (due to chronic venous stasis changes),. third left toe with black discoloration/redness; no drainage.  Data Reviewed: Basic Metabolic Panel:  Recent Labs Lab 05/17/15 1754 05/18/15 0150 05/19/15 0615 05/21/15 0542  NA 141 138 142 138  K 4.1 3.9 4.1 4.1  CL 107 103 111 109  CO2 23 24 22 22   GLUCOSE 99 82 141* 172*  BUN 41* 40* 23* 18  CREATININE 2.49* 2.31* 2.04* 2.30*  CALCIUM 9.4 9.0 8.7* 8.5*   Liver Function Tests:  Recent Labs Lab 05/17/15 1754  AST 26  ALT 16  ALKPHOS 90  BILITOT 0.4  PROT 7.5  ALBUMIN 3.4*    Recent Labs Lab 05/18/15 0150  LIPASE 32   CBC:  Recent Labs Lab 05/17/15 1754 05/18/15 0150 05/19/15 0615  WBC 7.5 8.0 6.5  HGB 11.0* 10.5* 10.0*  HCT 34.2* 32.5* 30.3*  MCV 87.7 88.8 86.8  PLT 249 250 210  CBG:  Recent Labs Lab 05/20/15 2018 05/21/15 0004 05/21/15 0415 05/21/15 0733 05/21/15 1143  GLUCAP 276* 231* 182* 121* 126*    Recent Results (from the past 240 hour(s))  Culture, blood (routine x 2)     Status: None (Preliminary result)   Collection Time: 05/18/15  6:53 AM  Result Value Ref Range Status   Specimen Description BLOOD LEFT HAND  Final   Special Requests IN PEDIATRIC BOTTLE  3CC  Final   Culture NO GROWTH 3 DAYS  Final   Report Status PENDING  Incomplete  Culture, blood  (routine x 2)     Status: None (Preliminary result)   Collection Time: 05/18/15  6:59 AM  Result Value Ref Range Status   Specimen Description BLOOD RIGHT ARM  Final   Special Requests IN PEDIATRIC BOTTLE  3CC  Final   Culture NO GROWTH 3 DAYS  Final   Report Status PENDING  Incomplete  MRSA PCR Screening     Status: None   Collection Time: 05/21/15 11:50 AM  Result Value Ref Range Status   MRSA by PCR NEGATIVE NEGATIVE Final    Comment:        The GeneXpert MRSA Assay (FDA approved for NASAL specimens only), is one component of a comprehensive MRSA colonization surveillance program. It is not intended to diagnose MRSA infection nor to guide or monitor treatment for MRSA infections.      Studies: No results found.  Scheduled Meds: . amLODipine  5 mg Oral Daily  . dextrose  25 mL Intravenous Once  . heparin  5,000 Units Subcutaneous Once  . heparin subcutaneous  5,000 Units Subcutaneous 3 times per day  . insulin aspart  0-9 Units Subcutaneous 6 times per day  . insulin glargine  10 Units Subcutaneous QHS  . paricalcitol  1 mcg Oral Daily  . piperacillin-tazobactam (ZOSYN)  IV  3.375 g Intravenous Q8H  . rosuvastatin  10 mg Oral Daily  . vancomycin  750 mg Intravenous Q24H   Continuous Infusions: . sodium chloride 100 mL/hr at 05/20/15 1656    Principal Problem:   Diabetic infection of left foot ([place]) Active Problems:   Diabetes mellitus without complication (HCC)   HLD (hyperlipidemia)   Essential hypertension   Acute renal failure superimposed on stage 4 chronic kidney disease (HCC)   Osteomyelitis (HCC)   Abdominal pain   Osteomyelitis of left foot ([place])    Time spent: 25 minutes.     Barton Dubois  [facility] Pager [phone]. If 7PM-7AM, please contact night-coverage at [email or web address], password [facility] 05/21/2015, 4:48 PM  LOS: 3 days

## 2015-05-21 NOTE — Op Note (Signed)
05/17/2015 - 05/21/2015  6:53 PM  PATIENT:  Sarah Kelley    PRE-OPERATIVE DIAGNOSIS:  Osteomyelitis Left Foot 3rd Toe  POST-OPERATIVE DIAGNOSIS:  Same  PROCEDURE:  Left Foot 3rd Ray Amputation  local tissue rearrangement for wound closure 3 x 7 cm  SURGEON:  DUDA,MARCUS V, MD  PHYSICIAN ASSISTANT:None ANESTHESIA:   General  PREOPERATIVE INDICATIONS:  [name] is a  64 y.o. female with a diagnosis of Osteomyelitis Left Foot 3rd Toe who failed conservative measures and elected for surgical management.    The risks benefits and alternatives were discussed with the patient preoperatively including but not limited to the risks of infection, bleeding, nerve injury, cardiopulmonary complications, the need for revision surgery, among others, and the patient was willing to proceed.  OPERATIVE IMPLANTS: None  OPERATIVE FINDINGS: Abscess and third toe did not extend into the webspace or proximal to the MTP joint  OPERATIVE PROCEDURE: Patient was brought to the operating room and underwent a general anesthetic. After adequate levels anesthesia obtained patient's left lower extremity was prepped using DuraPrep draped into a sterile field. A timeout was called. A racquet incision was made around the toe there was purulence within the toe itself but did not extend proximal to the MTP joint did not extend into the webspace. The third ray was resected proximally and the toe and ray were resected in 1 block of tissue. The wound was irrigated with normal saline electrocautery was used for hemostasis. Local tissue rearrangement was performed for wound closure 3 x 7 cm. The incision was closed using 2-0 nylon. A sterile compressive dressing was applied. Patient was extubated taken to the PACU in stable condition.

## 2015-05-21 NOTE — Anesthesia Procedure Notes (Signed)
Procedure Name: LMA Insertion Date/Time: 05/21/2015 6:29 PM Performed by: Claris Che Pre-anesthesia Checklist: Patient identified, Emergency Drugs available, Suction available, Patient being monitored and Timeout performed Patient Re-evaluated:Patient Re-evaluated prior to inductionOxygen Delivery Method: Circle system utilized Preoxygenation: Pre-oxygenation with 100% oxygen Intubation Type: IV induction Ventilation: Mask ventilation without difficulty LMA: LMA inserted LMA Size: 4.0 Number of attempts: 1 Placement Confirmation: positive ETCO2 and breath sounds checked- equal and bilateral Tube secured with: Tape Dental Injury: Teeth and Oropharynx as per pre-operative assessment

## 2015-05-21 NOTE — Interval H&P Note (Signed)
History and Physical Interval Note:  05/21/2015 3:53 PM  Sarah Kelley  has presented today for surgery, with the diagnosis of Osteomyelitis Left Foot 3rd Toe  The various methods of treatment have been discussed with the patient and family. After consideration of risks, benefits and other options for treatment, the patient has consented to  Procedure(s): Left Foot 3rd Ray Amputation (Left) as a surgical intervention .  The patient's history has been reviewed, patient examined, no change in status, stable for surgery.  I have reviewed the patient's chart and labs.  Questions were answered to the patient's satisfaction.     DUDA,MARCUS V

## 2015-05-21 NOTE — Anesthesia Preprocedure Evaluation (Addendum)
Anesthesia Evaluation  Patient identified by MRN, date of birth, ID band Patient awake    Reviewed: Allergy & Precautions, H&P , NPO status , Patient's Chart, lab work & pertinent test results  Airway Mallampati: III  TM Distance: >3 FB Neck ROM: Full    Dental no notable dental hx. (+) Poor Dentition, Dental Advisory Given   Pulmonary neg pulmonary ROS,    Pulmonary exam normal breath sounds clear to auscultation       Cardiovascular hypertension, Pt. on medications  Rhythm:Regular Rate:Normal     Neuro/Psych Anxiety Depression negative neurological ROS     GI/Hepatic negative GI ROS, Neg liver ROS,   Endo/Other  diabetes, Well Controlled, Type 2, Insulin Dependent, Oral Hypoglycemic Agents  Renal/GU Renal InsufficiencyRenal disease  negative genitourinary   Musculoskeletal   Abdominal   Peds  Hematology negative hematology ROS (+)   Anesthesia Other Findings   Reproductive/Obstetrics negative OB ROS                            Anesthesia Physical Anesthesia Plan  ASA: III  Anesthesia Plan: General   Post-op Pain Management:    Induction: Intravenous  Airway Management Planned: LMA  Additional Equipment:   Intra-op Plan:   Post-operative Plan: Extubation in OR  Informed Consent: I have reviewed the patients History and Physical, chart, labs and discussed the procedure including the risks, benefits and alternatives for the proposed anesthesia with the patient or authorized representative who has indicated his/her understanding and acceptance.   Dental advisory given  Plan Discussed with: CRNA  Anesthesia Plan Comments:         Anesthesia Quick Evaluation

## 2015-05-21 NOTE — H&P (View-Only) (Signed)
Reason for Consult: Osteomyelitis left foot third toe Referring Physician: Dr. Lamar Blinks Sarah Kelley is an 64 y.o. female.  HPI: Patient is a 64 year old woman who is seen for evaluation for ulceration and exposed bone left foot third toe. Patient states the ulceration has been present for months. She states that she has seen several physicians and was referred to [facility] for admission for treatment of the osteomyelitis.  Past Medical History  Diagnosis Date  . Diabetes mellitus   . Hypertension   . Hyperlipidemia   . Renal disorder   . Thyroid disease     History reviewed. No pertinent past surgical history.  History reviewed. No pertinent family history.  Social History:  reports that she has never smoked. She has never used smokeless tobacco. She reports that she does not drink alcohol or use illicit drugs.  Allergies: No Known Allergies  Medications: I have reviewed the patient's current medications.  Results for orders placed or performed during the hospital encounter of 05/17/15 (from the past 48 hour(s))  Comprehensive metabolic panel     Status: Abnormal   Collection Time: 05/17/15  5:54 PM  Result Value Ref Range   Sodium 141 135 - 145 mmol/L   Potassium 4.1 3.5 - 5.1 mmol/L   Chloride 107 101 - 111 mmol/L   CO2 23 22 - 32 mmol/L   Glucose, Bld 99 65 - 99 mg/dL   BUN 41 (H) 6 - 20 mg/dL   Creatinine, Ser 2.49 (H) 0.44 - 1.00 mg/dL   Calcium 9.4 8.9 - 10.3 mg/dL   Total Protein 7.5 6.5 - 8.1 g/dL   Albumin 3.4 (L) 3.5 - 5.0 g/dL   AST 26 15 - 41 U/L   ALT 16 14 - 54 U/L   Alkaline Phosphatase 90 38 - 126 U/L   Total Bilirubin 0.4 0.3 - 1.2 mg/dL   GFR calc non Af Amer 19 (L) >60 mL/min   GFR calc Af Amer 22 (L) >60 mL/min    Comment: (NOTE) The eGFR has been calculated using the CKD EPI equation. This calculation has not been validated in all clinical situations. eGFR's persistently <60 mL/min signify possible Chronic Kidney Disease.    Anion gap 11 5  - 15  CBC     Status: Abnormal   Collection Time: 05/17/15  5:54 PM  Result Value Ref Range   WBC 7.5 4.0 - 10.5 K/uL   RBC 3.90 3.87 - 5.11 MIL/uL   Hemoglobin 11.0 (L) 12.0 - 15.0 g/dL   HCT 34.2 (L) 36.0 - 46.0 %   MCV 87.7 78.0 - 100.0 fL   MCH 28.2 26.0 - 34.0 pg   MCHC 32.2 30.0 - 36.0 g/dL   RDW 13.5 11.5 - 15.5 %   Platelets 249 150 - 400 K/uL  Sedimentation rate     Status: Abnormal   Collection Time: 05/17/15 10:17 PM  Result Value Ref Range   Sed Rate 99 (H) 0 - 22 mm/hr  C-reactive protein     Status: Abnormal   Collection Time: 05/17/15 10:17 PM  Result Value Ref Range   CRP 5.9 (H) <1.0 mg/dL  Glucose, capillary     Status: None   Collection Time: 05/18/15  1:09 AM  Result Value Ref Range   Glucose-Capillary 66 65 - 99 mg/dL  Protime-INR     Status: Abnormal   Collection Time: 05/18/15  1:50 AM  Result Value Ref Range   Prothrombin Time 16.0 (H) 11.6 - 15.2 seconds  INR 1.26 0.00 - 1.49  APTT     Status: Abnormal   Collection Time: 05/18/15  1:50 AM  Result Value Ref Range   aPTT 43 (H) 24 - 37 seconds    Comment:        IF BASELINE aPTT IS ELEVATED, SUGGEST PATIENT RISK ASSESSMENT BE USED TO DETERMINE APPROPRIATE ANTICOAGULANT THERAPY.   Type and screen [place]     Status: None   Collection Time: 05/18/15  1:50 AM  Result Value Ref Range   ABO/RH(D) B POS    Antibody Screen NEG    Sample Expiration 05/21/2015   Lipase, blood     Status: None   Collection Time: 05/18/15  1:50 AM  Result Value Ref Range   Lipase 32 11 - 51 U/L  Hemoglobin A1c     Status: Abnormal   Collection Time: 05/18/15  1:50 AM  Result Value Ref Range   Hgb A1c MFr Bld 9.8 (H) 4.8 - 5.6 %    Comment: (NOTE)         Pre-diabetes: 5.7 - 6.4         Diabetes: >6.4         Glycemic control for adults with diabetes: <7.0    Mean Plasma Glucose 235 mg/dL    Comment: (NOTE) Performed At: [facility] [place], [place]  [id number] Lindon Romp MD [id number]   Basic metabolic panel     Status: Abnormal   Collection Time: 05/18/15  1:50 AM  Result Value Ref Range   Sodium 138 135 - 145 mmol/L   Potassium 3.9 3.5 - 5.1 mmol/L   Chloride 103 101 - 111 mmol/L   CO2 24 22 - 32 mmol/L   Glucose, Bld 82 65 - 99 mg/dL   BUN 40 (H) 6 - 20 mg/dL   Creatinine, Ser 2.31 (H) 0.44 - 1.00 mg/dL   Calcium 9.0 8.9 - 10.3 mg/dL   GFR calc non Af Amer 21 (L) >60 mL/min   GFR calc Af Amer 24 (L) >60 mL/min    Comment: (NOTE) The eGFR has been calculated using the CKD EPI equation. This calculation has not been validated in all clinical situations. eGFR's persistently <60 mL/min signify possible Chronic Kidney Disease.    Anion gap 11 5 - 15  CBC     Status: Abnormal   Collection Time: 05/18/15  1:50 AM  Result Value Ref Range   WBC 8.0 4.0 - 10.5 K/uL   RBC 3.66 (L) 3.87 - 5.11 MIL/uL   Hemoglobin 10.5 (L) 12.0 - 15.0 g/dL   HCT 32.5 (L) 36.0 - 46.0 %   MCV 88.8 78.0 - 100.0 fL   MCH 28.7 26.0 - 34.0 pg   MCHC 32.3 30.0 - 36.0 g/dL   RDW 13.5 11.5 - 15.5 %   Platelets 250 150 - 400 K/uL  Lipid panel     Status: Abnormal   Collection Time: 05/18/15  1:50 AM  Result Value Ref Range   Cholesterol 134 0 - 200 mg/dL   Triglycerides 160 (H) <150 mg/dL   HDL 38 (L) >40 mg/dL   Total CHOL/HDL Ratio 3.5 RATIO   VLDL 32 0 - 40 mg/dL   LDL Cholesterol 64 0 - 99 mg/dL    Comment:        Total Cholesterol/HDL:CHD Risk Coronary Heart Disease Risk Table  Men   Women  1/2 Average Risk   3.4   3.3  Average Risk       5.0   4.4  2 X Average Risk   9.6   7.1  3 X Average Risk  23.4   11.0        Use the calculated Patient Ratio above and the CHD Risk Table to determine the patient's CHD Risk.        ATP III CLASSIFICATION (LDL):  <100     mg/dL   Optimal  100-129  mg/dL   Near or Above                    Optimal  130-159  mg/dL   Borderline  160-189  mg/dL   High  >190     mg/dL    Very High   ABO/Rh     Status: None   Collection Time: 05/18/15  1:50 AM  Result Value Ref Range   ABO/RH(D) B POS   Glucose, capillary     Status: None   Collection Time: 05/18/15  1:54 AM  Result Value Ref Range   Glucose-Capillary 71 65 - 99 mg/dL  Creatinine, urine, random     Status: None   Collection Time: 05/18/15  3:30 AM  Result Value Ref Range   Creatinine, Urine 51.52 mg/dL  Sodium, urine, random     Status: None   Collection Time: 05/18/15  3:30 AM  Result Value Ref Range   Sodium, Ur 77 mmol/L  Glucose, capillary     Status: None   Collection Time: 05/18/15  4:35 AM  Result Value Ref Range   Glucose-Capillary 81 65 - 99 mg/dL  Glucose, capillary     Status: None   Collection Time: 05/18/15  8:34 AM  Result Value Ref Range   Glucose-Capillary 68 65 - 99 mg/dL  Glucose, capillary     Status: Abnormal   Collection Time: 05/18/15  9:35 AM  Result Value Ref Range   Glucose-Capillary 131 (H) 65 - 99 mg/dL  Glucose, capillary     Status: None   Collection Time: 05/18/15 11:43 AM  Result Value Ref Range   Glucose-Capillary 94 65 - 99 mg/dL  Glucose, capillary     Status: Abnormal   Collection Time: 05/18/15  5:30 PM  Result Value Ref Range   Glucose-Capillary 157 (H) 65 - 99 mg/dL  Glucose, capillary     Status: Abnormal   Collection Time: 05/18/15  8:55 PM  Result Value Ref Range   Glucose-Capillary 236 (H) 65 - 99 mg/dL  Glucose, capillary     Status: Abnormal   Collection Time: 05/19/15 12:52 AM  Result Value Ref Range   Glucose-Capillary 215 (H) 65 - 99 mg/dL  Glucose, capillary     Status: Abnormal   Collection Time: 05/19/15  4:40 AM  Result Value Ref Range   Glucose-Capillary 131 (H) 65 - 99 mg/dL  CBC     Status: Abnormal   Collection Time: 05/19/15  6:15 AM  Result Value Ref Range   WBC 6.5 4.0 - 10.5 K/uL   RBC 3.49 (L) 3.87 - 5.11 MIL/uL   Hemoglobin 10.0 (L) 12.0 - 15.0 g/dL   HCT 30.3 (L) 36.0 - 46.0 %   MCV 86.8 78.0 - 100.0 fL   MCH 28.7  26.0 - 34.0 pg   MCHC 33.0 30.0 - 36.0 g/dL   RDW 13.3 11.5 - 15.5 %   Platelets 210  150 - 400 K/uL  Basic metabolic panel     Status: Abnormal   Collection Time: 05/19/15  6:15 AM  Result Value Ref Range   Sodium 142 135 - 145 mmol/L   Potassium 4.1 3.5 - 5.1 mmol/L   Chloride 111 101 - 111 mmol/L   CO2 22 22 - 32 mmol/L   Glucose, Bld 141 (H) 65 - 99 mg/dL   BUN 23 (H) 6 - 20 mg/dL   Creatinine, Ser 2.04 (H) 0.44 - 1.00 mg/dL   Calcium 8.7 (L) 8.9 - 10.3 mg/dL   GFR calc non Af Amer 24 (L) >60 mL/min   GFR calc Af Amer 28 (L) >60 mL/min    Comment: (NOTE) The eGFR has been calculated using the CKD EPI equation. This calculation has not been validated in all clinical situations. eGFR's persistently <60 mL/min signify possible Chronic Kidney Disease.    Anion gap 9 5 - 15  Glucose, capillary     Status: Abnormal   Collection Time: 05/19/15  7:51 AM  Result Value Ref Range   Glucose-Capillary 155 (H) 65 - 99 mg/dL  Glucose, capillary     Status: Abnormal   Collection Time: 05/19/15 12:01 PM  Result Value Ref Range   Glucose-Capillary 263 (H) 65 - 99 mg/dL    Ct Abdomen Pelvis Wo Contrast  05/18/2015  CLINICAL DATA:  New onset abdominal pain EXAM: CT ABDOMEN AND PELVIS WITHOUT CONTRAST TECHNIQUE: Multidetector CT imaging of the abdomen and pelvis was performed following the standard protocol without IV contrast. COMPARISON:  None. FINDINGS: Lung bases are free of acute infiltrate or sizable effusion. A calcified granuloma is noted in the right lung base. The gallbladder, liver, spleen, adrenal glands and pancreas are within normal limits. The kidneys show mild fullness of the collecting systems bilaterally. This is likely related to the distended bladder as no definitive ureteral calculi are seen. The bladder is over distended. No filling defect is noted. Small amount of air is noted within the bladder which may be related to recent instrumentation. Clinical correlation is  recommended. Diffuse vascular calcifications are noted throughout the uterus. No pelvic mass lesion is noted. No free pelvic fluid is seen. The appendix is not well visualized. No inflammatory changes are identified. Aortoiliac calcifications are noted without aneurysmal dilatation. The osseous structures show no acute abnormality. IMPRESSION: No acute abnormality noted. The bladder is distended with associated mild hydronephrosis bilaterally. Electronically Signed   By: Inez Catalina M.D.   On: 05/18/2015 07:41   Dg Foot 2 Views Left  05/17/2015  CLINICAL DATA:  Chronic left third and fourth toe open wound for 2 months, with pain. Assess for osteomyelitis. Initial encounter. EXAM: LEFT FOOT - 2 VIEW COMPARISON:  None. FINDINGS: There is near complete absence of the third distal phalanx, with vague lucency at the third middle phalanx. This is compatible with osteomyelitis. No definite additional erosions are seen. Soft tissue swelling is noted about the third toe. Visualized joint spaces are otherwise preserved. Small plantar and posterior calcaneal spurs are seen. IMPRESSION: Near complete absence of the third distal phalanx, with vague lucency at the third middle phalanx. This is compatible with osteomyelitis. Soft tissue swelling about the third toe. Electronically Signed   By: Garald Balding M.D.   On: 05/17/2015 23:00    Review of Systems  All other systems reviewed and are negative.  Blood pressure 150/45, pulse 78, temperature 98.8 F (37.1 C), temperature source Oral, resp. rate 18, height _0  (  1.676 m), weight 72.077 kg (158 lb 14.4 oz), SpO2 98 %. Physical Exam On examination patient has a warm lower extremities bilaterally. She has a faintly palpable dorsalis pedis pulse. Her radiographs are reviewed which shows complete destructive changes of the tuft of the third toe left foot secondary to osteomyelitis. There is mild ischemic changes to the third toe she does not have protective sensation  there is no ascending cellulitis there is no purulent drainage. 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  Plan: We will plan for a left foot third Ray amputation. We'll schedule this for Friday or Saturday. Patient could not tolerate the blood pressure cuffs for an ankle-brachial indices and if patient is having trouble healing this amputation would need to schedule her for vascular vein consultation.  DUDA,MARCUS V 05/19/2015, 12:52 PM

## 2015-05-21 NOTE — Anesthesia Postprocedure Evaluation (Signed)
  Anesthesia Post-op Note  Patient: Sarah Kelley  Procedure(s) Performed: Procedure(s): Left Foot 3rd Ray Amputation (Left)  Patient Location: PACU  Anesthesia Type: General   Level of Consciousness: awake, alert  and oriented  Airway and Oxygen Therapy: Patient Spontanous Breathing  Post-op Pain: mild  Post-op Assessment: Post-op Vital signs reviewed  Post-op Vital Signs: Reviewed  Last Vitals:  Filed Vitals:   05/21/15 1902  BP: 168/68  Pulse: 79  Temp: 36.8 C  Resp:     Complications: No apparent anesthesia complications

## 2015-05-21 NOTE — Progress Notes (Signed)
Report called to Short Stay/Surgery

## 2015-05-21 NOTE — Progress Notes (Signed)
OT NOTE  OT notes pending amputation of 3rd toe by Dr Sharol Given planned. OT to hold further therapy at this time pending surgery.    Jeri Modena   OTR/L Pager: [phone] Office: [phone] .

## 2015-05-21 NOTE — Progress Notes (Signed)
Orthopedic Tech Progress Note Patient Details:  Sarah Kelley July 11, 1950 [id number]  Ortho Devices Type of Ortho Device: Postop shoe/boot Ortho Device/Splint Location: LLE Ortho Device/Splint Interventions: Ordered, Application   Braulio Bosch 05/21/2015, 8:49 PM

## 2015-05-21 NOTE — Transfer of Care (Signed)
Immediate Anesthesia Transfer of Care Note  Patient: Sarah Kelley  Procedure(s) Performed: Procedure(s): Left Foot 3rd Ray Amputation (Left)  Patient Location: PACU  Anesthesia Type:General  Level of Consciousness: oriented, sedated, patient cooperative and responds to stimulation  Airway & Oxygen Therapy: Patient Spontanous Breathing and Patient connected to nasal cannula oxygen  Post-op Assessment: Report given to RN, Post -op Vital signs reviewed and stable and Patient moving all extremities X 4  Post vital signs: Reviewed and stable  Last Vitals:  Filed Vitals:   05/21/15 1902  BP: 168/68  Pulse: 79  Temp: 36.8 C  Resp:     Complications: No apparent anesthesia complications

## 2015-05-22 DIAGNOSIS — L97509 Non-pressure chronic ulcer of other part of unspecified foot with unspecified severity: Secondary | ICD-10-CM

## 2015-05-22 DIAGNOSIS — E11621 Type 2 diabetes mellitus with foot ulcer: Secondary | ICD-10-CM

## 2015-05-22 LAB — BASIC METABOLIC PANEL
ANION GAP: 10 (ref 5–15)
BUN: 16 mg/dL (ref 6–20)
CO2: 21 mmol/L — AB (ref 22–32)
Calcium: 8.3 mg/dL — ABNORMAL LOW (ref 8.9–10.3)
Chloride: 107 mmol/L (ref 101–111)
Creatinine, Ser: 2.21 mg/dL — ABNORMAL HIGH (ref 0.44–1.00)
GFR calc Af Amer: 26 mL/min — ABNORMAL LOW (ref >60)
GFR calc non Af Amer: 22 mL/min — ABNORMAL LOW (ref >60)
GLUCOSE: 180 mg/dL — AB (ref 65–99)
POTASSIUM: 3.8 mmol/L (ref 3.5–5.1)
Sodium: 138 mmol/L (ref 135–145)

## 2015-05-22 LAB — CBC
HEMATOCRIT: 29.5 % — AB (ref 36.0–46.0)
Hemoglobin: 9.6 g/dL — ABNORMAL LOW (ref 12.0–15.0)
MCH: 28 pg (ref 26.0–34.0)
MCHC: 32.5 g/dL (ref 30.0–36.0)
MCV: 86 fL (ref 78.0–100.0)
Platelets: 211 10*3/uL (ref 150–400)
RBC: 3.43 MIL/uL — AB (ref 3.87–5.11)
RDW: 13.4 % (ref 11.5–15.5)
WBC: 7.3 10*3/uL (ref 4.0–10.5)

## 2015-05-22 LAB — GLUCOSE, CAPILLARY
GLUCOSE-CAPILLARY: 176 mg/dL — AB (ref 65–99)
GLUCOSE-CAPILLARY: 194 mg/dL — AB (ref 65–99)
Glucose-Capillary: 143 mg/dL — ABNORMAL HIGH (ref 65–99)
Glucose-Capillary: 175 mg/dL — ABNORMAL HIGH (ref 65–99)

## 2015-05-22 MED ORDER — DOXYCYCLINE HYCLATE 100 MG PO TABS
100.0000 mg | ORAL_TABLET | Freq: Two times a day (BID) | ORAL | Status: DC
  Administered 2015-05-22 – 2015-05-23 (×2): 100 mg via ORAL
  Filled 2015-05-22 (×3): qty 1

## 2015-05-22 NOTE — Progress Notes (Signed)
[facility] PROGRESS NOTE  Sarah Kelley [id number] DOB: May 08, 1951 DOA: 05/17/2015 PCP: No primary care provider on file.  Assessment/Plan: Diabetic infection of left foot ([place]) and Osteomyelitis of left foot ([place]): X-ray showed osteomyelitis, no need to do MRI.  -will switch abx's to PO -ortho consulted and s/p amputation on 11/18 afternoon -will follow clinical response and further rec's; PT/OT to see her -no fever and normal WBC's now  DM-II:  -A1C 9.8; demonstrating poor diabetes control -will continue SSI and low dose long acting -poor appetite as inpatient -patient educated about low carb diet and medication compliance at discharge  HLD:  -Continue Crestor  -LDL 64; HDL 38 and TG 160  Essential hypertension: -continue Amlodipine  Abdominal pain: New onset. etiology is not exactly clear -CT abdomen/pelvis without contrast negative, except for only mild hydronephrosis.  -continue PRN Morphine for pain -normal lipase   AoCKD-IV: Baseline Cre is 1.9 on 12/31/08, her Cre is 2.49 on admisison. Likely due to prerenal secondary to dehydration. -continue IVF's -patient CT abd/pelvis with just mild hydronephrosis and distended bladder -foley was inserted -Cr trending down  -will follow BMET in am -will attempt voiding trial after surgical procedure (planned for today afternoon, 11/18)   Code Status: Full code.  Family Communication: care discussed with patient.  Disposition Plan: Remain inpatient for treatment of foot infection and renal failure. Plan is for amputation on third left toe on fir or Sat   Consultants:  ortho  Procedures: LE duplex: no evidence of DVT or baker cyst's  Antibiotics:  Vancomycin 11-15>>11/19  Zosyn 11-15>>11/19  Doxycycline 11/19  HPI/Subjective: Relates left foot pain is moderate; no fever, no CP, no SOB.   Objective: Filed Vitals:   05/22/15 1046  BP: 136/47  Pulse: 73  Temp:   Resp: 18    Intake/Output  Summary (Last 24 hours) at 05/22/15 1428 Last data filed at 05/22/15 0830  Gross per 24 hour  Intake   1240 ml  Output   2975 ml  Net  -1735 ml   Filed Weights   05/18/15 2058 05/19/15 2009 05/20/15 2019  Weight: 72.077 kg (158 lb 14.4 oz) 71.668 kg (158 lb) 71.2 kg (156 lb 15.5 oz)    Exam:   General:  Alert in no acute distress. Afebrile and reporting pain to be moderate. No CP or SOB. Left foot with dry and clean dressings in place.  Cardiovascular: S 1, S 2 RRR  Respiratory: CTA bilaterally   Abdomen: bs present, soft, nt  Musculoskeletal: BL extremity with hyperpigmentation (due to chronic venous stasis changes),. Left foot with clean, dry and intact dressings in place.  Data Reviewed: Basic Metabolic Panel:  Recent Labs Lab 05/17/15 1754 05/18/15 0150 05/19/15 0615 05/21/15 0542 05/22/15 0428  NA 141 138 142 138 138  K 4.1 3.9 4.1 4.1 3.8  CL 107 103 111 109 107  CO2 23 24 22 22  21*  GLUCOSE 99 82 141* 172* 180*  BUN 41* 40* 23* 18 16  CREATININE 2.49* 2.31* 2.04* 2.30* 2.21*  CALCIUM 9.4 9.0 8.7* 8.5* 8.3*   Liver Function Tests:  Recent Labs Lab 05/17/15 1754  AST 26  ALT 16  ALKPHOS 90  BILITOT 0.4  PROT 7.5  ALBUMIN 3.4*    Recent Labs Lab 05/18/15 0150  LIPASE 32   CBC:  Recent Labs Lab 05/17/15 1754 05/18/15 0150 05/19/15 0615 05/22/15 0428  WBC 7.5 8.0 6.5 7.3  HGB 11.0* 10.5* 10.0* 9.6*  HCT 34.2* 32.5* 30.3* 29.5*  MCV 87.7 88.8 86.8 86.0  PLT 249 250 210 211   CBG:  Recent Labs Lab 05/21/15 1804 05/21/15 1902 05/21/15 1946 05/22/15 0740 05/22/15 1216  GLUCAP 105* 109* 115* 143* 176*    Recent Results (from the past 240 hour(s))  Culture, blood (routine x 2)     Status: None (Preliminary result)   Collection Time: 05/18/15  6:53 AM  Result Value Ref Range Status   Specimen Description BLOOD LEFT HAND  Final   Special Requests IN PEDIATRIC BOTTLE  3CC  Final   Culture NO GROWTH 4 DAYS  Final   Report Status  PENDING  Incomplete  Culture, blood (routine x 2)     Status: None (Preliminary result)   Collection Time: 05/18/15  6:59 AM  Result Value Ref Range Status   Specimen Description BLOOD RIGHT ARM  Final   Special Requests IN PEDIATRIC BOTTLE  3CC  Final   Culture NO GROWTH 4 DAYS  Final   Report Status PENDING  Incomplete  MRSA PCR Screening     Status: None   Collection Time: 05/21/15 11:50 AM  Result Value Ref Range Status   MRSA by PCR NEGATIVE NEGATIVE Final    Comment:        The GeneXpert MRSA Assay (FDA approved for NASAL specimens only), is one component of a comprehensive MRSA colonization surveillance program. It is not intended to diagnose MRSA infection nor to guide or monitor treatment for MRSA infections.      Studies: No results found.  Scheduled Meds: . amLODipine  5 mg Oral Daily  . dextrose  25 mL Intravenous Once  . doxycycline  100 mg Oral Q12H  . heparin  5,000 Units Subcutaneous Once  . heparin subcutaneous  5,000 Units Subcutaneous 3 times per day  . insulin aspart  0-9 Units Subcutaneous TID WC  . insulin glargine  10 Units Subcutaneous QHS  . paricalcitol  1 mcg Oral Daily  . rosuvastatin  10 mg Oral Daily   Continuous Infusions: . sodium chloride 100 mL/hr at 05/21/15 2259  . sodium chloride 10 mL/hr at 05/21/15 1808  . sodium chloride 10 mL/hr at 05/21/15 2259    Principal Problem:   Diabetic infection of left foot ([place]) Active Problems:   Diabetes mellitus without complication ([facility])   HLD (hyperlipidemia)   Essential hypertension   Acute renal failure superimposed on stage 4 chronic kidney disease ([facility])   Osteomyelitis ([facility])   Abdominal pain   Osteomyelitis of left foot ([place])    Time spent: 25 minutes.     Barton Dubois  [facility] Pager [phone]. If 7PM-7AM, please contact night-coverage at [email or web address], password [facility] 05/22/2015, 2:28 PM  LOS: 4 days

## 2015-05-22 NOTE — Progress Notes (Addendum)
ANTIBIOTIC CONSULT NOTE  Pharmacy Consult for Vancomycin and Zosyn  Indication: cellulitis / osteo  No Known Allergies   Labs:  Recent Labs  05/21/15 0542 05/22/15 0428  WBC  --  7.3  HGB  --  9.6*  PLT  --  211  CREATININE 2.30* 2.21*   Estimated Creatinine Clearance: 25.3 mL/min (by C-G formula based on Cr of 2.21). No results for input(s): VANCOTROUGH, VANCOPEAK, VANCORANDOM, GENTTROUGH, GENTPEAK, GENTRANDOM, TOBRATROUGH, TOBRAPEAK, TOBRARND, AMIKACINPEAK, AMIKACINTROU, AMIKACIN in the last 72 hours.    Assessment: 65 year old female continues on broad spectrum antibiotics for left foot infection/osteomyelitis Now s/p toe amputation  11/18 Afebrile, WBC WNL Blood cultures negative to date Scr stable - trending down   Plan:  Day # 5 of broad spectrum antibiotics -- consider stopping now s/p amputation  Talked with Dr. Dyann Kief -- Vancomycin and Zosyn to be stopped, doxcycine 100 mg po BID x 5 additional days  Thank you Anette Guarneri, PharmD [phone]  05/22/2015,12:16 PM

## 2015-05-22 NOTE — Evaluation (Signed)
Physical Therapy Evaluation Patient Details Name: Sarah Kelley MRN: [id number] DOB: April 04, 1951 Today's Date: 05/22/2015   History of Present Illness  64 yo female admitted with osteomyelitis with infection 3rd toe L foot. 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  PMH: HTN, Hyperlipidemia, DM, CKD IV  Clinical Impression  Pt was able to demonstrate some willingness to sit up bedside and should be able to initiate standing next visit.  Has need for correct shoe and have communicated with all staff to ensure this is done.    Follow Up Recommendations Home health PT;Supervision/Assistance - 24 hour    Equipment Recommendations  Rolling walker with 5" wheels (if pt does not have one)    Recommendations for Other Services       Precautions / Restrictions Precautions Precautions: Fall Required Braces or Orthoses: Other Brace/Splint Other Brace/Splint: post op shoe L  Restrictions Weight Bearing Restrictions: Yes Other Position/Activity Restrictions: WBAT heel only with Darco shoe      Mobility  Bed Mobility Overal bed mobility: Needs Assistance;+2 for physical assistance;+ 2 for safety/equipment Bed Mobility: Supine to Sit;Sit to Supine     Supine to sit: Mod assist Sit to supine: Mod assist   General bed mobility comments: support under trunk, support for legs and to slide hips with pad on bed  Transfers Overall transfer level:  (unable)               General transfer comment:  (Pt was not able to stand up on LLE due to lack of ortho shoe)  Ambulation/Gait             General Gait Details: unable  Stairs            Wheelchair Mobility    Modified Rankin (Stroke Patients Only)       Balance Overall balance assessment: Needs assistance Sitting-balance support: Feet unsupported Sitting balance-Leahy Scale: Fair Sitting balance - Comments: leans back with LE movement     Standing balance-Leahy Scale: Zero Standing balance comment:  cannot assess due to pain on LLE and limitations                             Pertinent Vitals/Pain Pain Assessment: 0-10 Pain Score: 10-Worst pain ever Pain Location: L foot as PT attempts to move Pain Descriptors / Indicators: Aching;Operative site guarding Pain Intervention(s): Limited activity within patient's tolerance;Monitored during session;Premedicated before session;Repositioned;Patient requesting pain meds-RN notified    Home Living Family/patient expects to be discharged to:: Private residence Living Arrangements: Spouse/significant other;Children Available Help at Discharge: Family;Available 24 hours/day Type of Home: Mobile home Home Access: Ramped entrance     Home Layout: One level Home Equipment: [place] - 2 wheels;Cane - single point      Prior Function Level of Independence: Needs assistance   Gait / Transfers Assistance Needed: used cane  ADL's / Homemaking Assistance Needed: assist for LB dressing        Hand Dominance   Dominant Hand: Right    Extremity/Trunk Assessment   Upper Extremity Assessment: Overall WFL for tasks assessed           Lower Extremity Assessment: Overall WFL for tasks assessed RLE Deficits / Details: some weakness but pain prohibits better testing LLE Deficits / Details: generalized weakness, numbness up to groin  Cervical / Trunk Assessment: Normal  Communication   Communication: Prefers language other than [place];Other (comment) (spanish translation)  Cognition Arousal/Alertness: Awake/alert Behavior During Therapy:  WFL for tasks assessed/performed Overall Cognitive Status: Within Functional Limits for tasks assessed                      General Comments General comments (skin integrity, edema, etc.): all family in to support pt and very encouraging to her    Exercises        Assessment/Plan    PT Assessment Patient needs continued PT services  PT Diagnosis Generalized weakness   PT  Problem List Decreased strength;Decreased range of motion;Decreased activity tolerance;Decreased balance;Decreased mobility;Decreased coordination;Decreased cognition;Decreased knowledge of use of DME;Decreased safety awareness;Decreased knowledge of precautions;Decreased skin integrity;Pain;Obesity  PT Treatment Interventions DME instruction;Gait training;Functional mobility training;Therapeutic activities;Therapeutic exercise;Balance training;Neuromuscular re-education;Patient/family education   PT Goals (Current goals can be found in the Care Plan section) Acute Rehab PT Goals Patient Stated Goal: none stated PT Goal Formulation: With patient/family Time For Goal Achievement: 06/05/15 Potential to Achieve Goals: Good    Frequency Min 3X/week   Barriers to discharge Inaccessible home environment cannot attempt standing yet    Co-evaluation               End of Session   Activity Tolerance: Patient limited by pain Patient left: in bed;with call bell/phone within reach;with family/visitor present Nurse Communication: Mobility status         Time: [id number] PT Time Calculation (min) (ACUTE ONLY): 31 min   Charges:   PT Evaluation $Initial PT Evaluation Tier I: 1 Procedure PT Treatments $Therapeutic Activity: 8-22 mins   PT G Codes:        Ramond Dial 05-29-15, 3:28 PM   Mee Hives, PT MS Acute Rehab Dept. Number: [phone] [id number] and [place] [phone]

## 2015-05-22 NOTE — Progress Notes (Signed)
MD notified of change in pt diet order. New order received for CBG before meals & bedtime instead of every 4 hours. Will continue to monitor the patient closely.

## 2015-05-23 DIAGNOSIS — IMO0002 Reserved for concepts with insufficient information to code with codable children: Secondary | ICD-10-CM | POA: Insufficient documentation

## 2015-05-23 DIAGNOSIS — Z89422 Acquired absence of other left toe(s): Secondary | ICD-10-CM

## 2015-05-23 DIAGNOSIS — M869 Osteomyelitis, unspecified: Secondary | ICD-10-CM

## 2015-05-23 DIAGNOSIS — E119 Type 2 diabetes mellitus without complications: Secondary | ICD-10-CM | POA: Insufficient documentation

## 2015-05-23 DIAGNOSIS — Z794 Long term (current) use of insulin: Secondary | ICD-10-CM

## 2015-05-23 LAB — BASIC METABOLIC PANEL
Anion gap: 7 (ref 5–15)
BUN: 18 mg/dL (ref 6–20)
CHLORIDE: 109 mmol/L (ref 101–111)
CO2: 22 mmol/L (ref 22–32)
Calcium: 8.6 mg/dL — ABNORMAL LOW (ref 8.9–10.3)
Creatinine, Ser: 2.15 mg/dL — ABNORMAL HIGH (ref 0.44–1.00)
GFR calc Af Amer: 26 mL/min — ABNORMAL LOW (ref >60)
GFR calc non Af Amer: 23 mL/min — ABNORMAL LOW (ref >60)
GLUCOSE: 167 mg/dL — AB (ref 65–99)
POTASSIUM: 3.7 mmol/L (ref 3.5–5.1)
Sodium: 138 mmol/L (ref 135–145)

## 2015-05-23 LAB — CULTURE, BLOOD (ROUTINE X 2)
CULTURE: NO GROWTH
Culture: NO GROWTH

## 2015-05-23 LAB — CBC
HEMATOCRIT: 26.9 % — AB (ref 36.0–46.0)
HEMOGLOBIN: 8.8 g/dL — AB (ref 12.0–15.0)
MCH: 28.2 pg (ref 26.0–34.0)
MCHC: 32.7 g/dL (ref 30.0–36.0)
MCV: 86.2 fL (ref 78.0–100.0)
Platelets: 217 10*3/uL (ref 150–400)
RBC: 3.12 MIL/uL — AB (ref 3.87–5.11)
RDW: 13.5 % (ref 11.5–15.5)
WBC: 7.1 10*3/uL (ref 4.0–10.5)

## 2015-05-23 LAB — GLUCOSE, CAPILLARY
GLUCOSE-CAPILLARY: 149 mg/dL — AB (ref 65–99)
Glucose-Capillary: 194 mg/dL — ABNORMAL HIGH (ref 65–99)
Glucose-Capillary: 218 mg/dL — ABNORMAL HIGH (ref 65–99)

## 2015-05-23 MED ORDER — OXYCODONE-ACETAMINOPHEN 5-325 MG PO TABS: 1.0000 | ORAL_TABLET | Freq: Four times a day (QID) | ORAL | Status: DC | PRN

## 2015-05-23 MED ORDER — DOXYCYCLINE HYCLATE 100 MG PO TABS
100.0000 mg | ORAL_TABLET | Freq: Two times a day (BID) | ORAL | Status: DC
Start: 2015-05-23 — End: 2015-06-14

## 2015-05-23 MED ORDER — POLYSACCHARIDE IRON COMPLEX 150 MG PO CAPS: 150.0000 mg | ORAL_CAPSULE | Freq: Two times a day (BID) | ORAL | Status: DC

## 2015-05-23 MED ORDER — AMLODIPINE BESYLATE 5 MG PO TABS: 7.5000 mg | ORAL_TABLET | Freq: Every day | ORAL | Status: DC

## 2015-05-23 MED ORDER — INSULIN GLARGINE 100 UNIT/ML ~~LOC~~ SOLN: 22.0000 [IU] | Freq: Two times a day (BID) | SUBCUTANEOUS | Status: DC

## 2015-05-23 MED ORDER — INSULIN GLARGINE 100 UNIT/ML ~~LOC~~ SOLN
20.0000 [IU] | Freq: Every day | SUBCUTANEOUS | Status: DC
  Filled 2015-05-23: qty 0.2

## 2015-05-23 MED ORDER — POLYSACCHARIDE IRON COMPLEX 150 MG PO CAPS
150.0000 mg | ORAL_CAPSULE | Freq: Two times a day (BID) | ORAL | Status: DC
  Administered 2015-05-23: 150 mg via ORAL
  Filled 2015-05-23 (×2): qty 1

## 2015-05-23 NOTE — Progress Notes (Signed)
Ortho tech,paged for patient's Darko Shoes.Discharged papers in [place] and [place] languages were printed,and with the interpreter and his son who speaks fluent [place] and Spanish,discharged instructions were explained.Questions were answered satisfactorily by the time of discharged.Discharged on stable condition.No open skin issues except the surgical site by the time of discharged.

## 2015-05-23 NOTE — Care Management Note (Signed)
Case Management Note  Patient Details  Name: Sarah Kelley MRN: [id number] Date of Birth: 08/11/1950  Subjective/Objective:        Osteomyelitis Left Foot 3rd Toe who failed conservative measures and elected for surgical management.              Action/Plan:  CM met with patient and husband for discharge planning.  Patient does not have health insurance. Patient is eligible for [facility] medication assistance. Patient enrolled letter given to patient with explanation teach back done verbalized understanding.  Contacted  [facility] for possible charity case HH, patient is ineligible. But [facility] will provide patient with equipment.  Discussed with patient but can explore OP services, patient in agreement, denies any transportation barriers. Discussed with Dr. Dyann Kief orders placed referral for [facility]  OP Rehab and [facility] OP diabetic Education and Nutritional services ,since patient is Medicaid pending patient is agreeable to plan. Patient will follow up with [facility]  and [employer] on 11/29 at 10am   Referrals placed for [facility] and OP Diabetic EDU.via [facility]. Discussed discharge plan with Dr. Dyann Kief he is agreeable with plan. Albert RN updated on plan No further CM needs identified. Expected Discharge Date:  05/24/15               Expected Discharge Plan:  Home/Self Care  In-House Referral:     Discharge planning Services  Follow-up appt scheduled, [place] [facility]  Post Acute Care Choice:  NA Choice offered to:     DME Arranged:  Wheelchair manual, Rolling walker, 3n1 chair DME Agency:  [place]:  NA Page Agency:     Status of Service:    completed sign off  Medicare Important Message Given:    Date Medicare IM Given:    Medicare IM give by:    Date Additional Medicare IM Given:    Additional Medicare Important Message give by:     If discussed at [place] of Stay Meetings, dates discussed:    Additional CommentsLaurena Slimmer, RN 05/23/2015, 4:21 PM

## 2015-05-23 NOTE — Progress Notes (Signed)
Orthopedic Tech Progress Note Patient Details:  Sarah Kelley 10-24-1950 [id number]  Ortho Devices Type of Ortho Device: Darco shoe Ortho Device/Splint Location: LLE Ortho Device/Splint Interventions: Application   [name] 05/23/2015, 6:34 PM

## 2015-05-23 NOTE — Progress Notes (Signed)
Physical Therapy Treatment Patient Details Name: Sarah Kelley MRN: [id number] DOB: 08-13-1950 Today's Date: 05/23/2015    History of Present Illness 64 yo female admitted with osteomyelitis with infection 3rd toe L foot. 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  PMH: HTN, Hyperlipidemia, DM, CKD IV    PT Comments    Session focused on working through ways for Sarah Kelley to be able to transfer and move keeping NWB LLE; she was able to scoot transfer to drop-arm recliner in simulation of getting to wheelchair, and she performed sit <> stand twice, keeping LLE without any weight while transferring to [facility]  Good NWB LLE, though slow moving; Discussed Darco shoe use so that if she does touch down L foot, it will be her rear foot and not her forefoot; Paged Dr. Dyann Kief re: obtaining darco shoe;   Family present and questions answered;   Follow Up Recommendations  Home health PT;Supervision/Assistance - 24 hour (Not sure that insurance will cover HHPT)     Equipment Recommendations  Rolling walker with 5" wheels;3in1 (PT);Wheelchair (measurements PT);Wheelchair cushion (measurements PT);Other (comment) (shower chair)    Recommendations for Other Services       Precautions / Restrictions Precautions Precautions: Fall Other Brace/Splint: post op shoe L  Restrictions Weight Bearing Restrictions: Yes LLE Weight Bearing: Non weight bearing Other Position/Activity Restrictions: If pt has difficulty keeping entirely NWB, discussed only touching the heel down, and even then putting minimal weight through foot    Mobility  Bed Mobility Overal bed mobility: Needs Assistance Bed Mobility: Supine to Sit     Supine to sit: Min assist     General bed mobility comments: min handheld assist to pull to sit; good half-bridge of hip to EOB; we discussed getting up on the Right side of the bed ( the stronger side) for greater ease at home  Transfers Overall transfer level:  Needs assistance Equipment used: Rolling walker (2 wheeled) Transfers: Sit to/from Stand;Lateral/Scoot Transfers Sit to Stand: Min assist        Lateral/Scoot Transfers: Min assist General transfer comment: Verbal and demo cues for safety, and to keep NWB LLE; practiced Lateral scoot transfer to recliner with armrest dropped to simulate getting to a wheelchair; 2 reps of sit to stand from recliner and from [facility], with min assist; pt seemed to gain confidence with the practice  Ambulation/Gait Ambulation/Gait assistance: Min assist Ambulation Distance (Feet):  (pivot heel-toe steps recliner to BSC then back to recliner) Assistive device: Rolling walker (2 wheeled) Gait Pattern/deviations:  (Heel-toe pivot on RLE)     General Gait Details: Able to keep NWB LLE quite well; min assist to maneuver RW; pt's son in room and gave very appropriate cueing and encouragement   Stairs            Wheelchair Mobility    Modified Rankin (Stroke Patients Only)       Balance             Standing balance-Leahy Scale: Poor                      Cognition Arousal/Alertness: Awake/alert Behavior During Therapy: WFL for tasks assessed/performed Overall Cognitive Status: Within Functional Limits for tasks assessed                      Exercises      General Comments        Pertinent Vitals/Pain Pain Assessment: Faces Faces Pain Scale: Hurts even  more Pain Location: L foot with any movement Pain Descriptors / Indicators: Grimacing;Guarding Pain Intervention(s): Limited activity within patient's tolerance;Monitored during session;Repositioned    Home Living                      Prior Function            PT Goals (current goals can now be found in the care plan section) Acute Rehab PT Goals Patient Stated Goal: none stated PT Goal Formulation: With patient/family Time For Goal Achievement: 06/05/15 Potential to Achieve Goals: Good Progress  towards PT goals: Progressing toward goals (good progress today compared to last session)    Frequency  Min 3X/week    PT Plan Current plan remains appropriate    Co-evaluation             End of Session Equipment Utilized During Treatment: Gait belt Activity Tolerance: Patient tolerated treatment well Patient left: in chair;with call bell/phone within reach;with family/visitor present     Time: [id number] PT Time Calculation (min) (ACUTE ONLY): 42 min  Charges:  $Gait Training: 8-22 mins $Therapeutic Activity: 23-37 mins                    G CodesQuin Hoop 05/23/2015, 5:15 PM  Sarah Kelley, [place] Pager [phone] Office [phone]

## 2015-05-23 NOTE — Discharge Summary (Signed)
Physician Discharge Summary  Sarah Kelley [id number] DOB: 06-Sep-1950 DOA: 05/17/2015  PCP: No primary care provider on file.  Admit date: 05/17/2015 Discharge date: 05/23/2015  Time spent: 40 minutes  Recommendations for Outpatient Follow-up:  1. Check BMET to follow electrolytes and renal function 2. Follow CBC to reassess Hgb trend 3. Reassess BP and adjust antihypertensive regimen as needed 4. Close follow up on CBG's and further adjust hypoglycemic regimen as needed    Discharge Diagnoses:  Principal Problem:   Diabetic infection of left foot ([place]) Active Problems:   Diabetes mellitus without complication ([place])   HLD (hyperlipidemia)   Essential hypertension   Acute renal failure superimposed on stage 4 chronic kidney disease (HCC)   Osteomyelitis (HCC)   Abdominal pain   Osteomyelitis of left foot (HCC) anemia of chronic kidney disease/acute blood loss anemia  Discharge Condition: stable and improved. Will be discharge home with family care, outpatient PT and follow up with orthopedic service in 2 weeks.  Diet recommendation: heart healthy and low carbohydrates diet   Filed Weights   05/18/15 2058 05/19/15 2009 05/20/15 2019  Weight: 72.077 kg (158 lb 14.4 oz) 71.668 kg (158 lb) 71.2 kg (156 lb 15.5 oz)    History of present illness:  64 y.o. female with PMH of hypertension, hyperlipidemia, diabetes mellitus, CKD-IV, who presents with left foot pain, abdominal pain, bilateral calf pain.  Patient does not become increased. She is accompanied by her granddaughter who speaks good [place]. Patient reports that she has been having infection and pain over third toe of left foot in the past 3 months. It has been progressively getting worse. She was seen by the wound care today, and she was sent to the emergency room due to severe infection. She has pus drainage from the tip of that toe. No fever, chills. She reports that she started having abdominal pain at  arrival to the emergency room. No nausea, vomiting, diarrhea. Her abdominal pain is located in the lower abdomen and right lower quadrant. It is mild, constant, nonradiating. She also reports having tenderness over bilateral calf areas. No recent long distance travel history. Patient does not have chest pain, shortness breath, cough, symptoms of a UTI or unilateral weakness.  In ED, patient was found to haveWBC 7.5, temperature normal, no tachycardia, electrolytes okay, CRP 5.9, worsening renal function.   Hospital Course:  Diabetic infection of left foot ([place]) and Osteomyelitis of left foot ([place]): X-ray showed osteomyelitis, no need to do MRI.  -will switch to doxycycline and complete 10 days after amputation  -ortho consulted and s/p amputation on 11/18 afternoon -will discharge home with family care and outpatient PT; will follow up with orthopedic service in 2 weeks  -no fever and normal WBC's at discharge  DM-II:  -A1C 9.8; demonstrating poor diabetes control -will discharge on adjusted dose of lantus -resume [place] daily -advise to follow low carbohydrates  HLD:  -Continue Crestor  -LDL 64; HDL 38 and TG 160  Essential hypertension: -continue Amlodipine, dose adjusted to 7.5mg   Abdominal pain: New onset. etiology is not exactly clear -CT abdomen/pelvis without contrast negative, except for only mild hydronephrosis.  -continue PRN Morphine for pain -normal lipase  -resolved after bladder distension relived by foley placement   AoCKD-IV/urinary retention: Baseline Cre is 1.9 on 12/31/08, her Cre is 2.49 on admisison. Likely due to prerenal secondary to dehydration and mild obstruction from urinary retention . -patient CT abd/pelvis with just mild hydronephrosis and distended bladder; foley was inserted on admisison  and patient tolerated voiding trial w/o problems -Cr trending down and essentially back to baseline   Anemia of chronic disease with acute blood los  component -will discharge on niferex -follow Hgb trend   Procedures:  Left third toe amputation 11/18  LE duplex: no evidence of DVT or baker cyst's  Consultations:  Orthopedic surgery   Discharge Exam: Filed Vitals:   05/23/15 0501 05/23/15 1040  BP: 154/46 160/52  Pulse: 72   Temp: 99.5 F (37.5 C)   Resp: 16     General: Alert in no acute distress. Afebrile and reporting pain on her left foot to be much better today. No CP or SOB. Left foot with dry and clean dressings in place.  Cardiovascular: S 1, S 2 RRR  Respiratory: CTA bilaterally   Abdomen: bs present, soft, nt  Musculoskeletal: BL extremity with hyperpigmentation (due to chronic venous stasis changes),. Left foot with clean, dry and intact dressings in place.   Discharge Instructions   Discharge Instructions    Diet - low sodium heart healthy    Complete by:  As directed      Discharge instructions    Complete by:  As directed   Keep dressing clean and dry Take medications as prescribed Check blood sugar twice a day (fasting in am and also before bedtime) Follow low carbohydrates diet Maintain adequate hydration Follow heart healthy diet  Arrange follow up with PCP in 1 week Follow with Dr. Sharol Given (orthopedic service) in 2 weeks          Current Discharge Medication List    START taking these medications   Details  doxycycline (VIBRA-TABS) 100 MG tablet Take 1 tablet (100 mg total) by mouth every 12 (twelve) hours. Qty: 20 tablet, Refills: 0    iron polysaccharides (NIFEREX) 150 MG capsule Take 1 capsule (150 mg total) by mouth 2 (two) times daily. Qty: 60 capsule, Refills: 0    oxyCODONE-acetaminophen (PERCOCET/ROXICET) 5-325 MG tablet Take 1-2 tablets by mouth every 6 (six) hours as needed for severe pain. Qty: 40 tablet, Refills: 0      CONTINUE these medications which have CHANGED   Details  amLODipine (NORVASC) 5 MG tablet Take 1.5 tablets (7.5 mg total) by mouth daily. Qty: 45  tablet, Refills: 2    insulin glargine (LANTUS) 100 UNIT/ML injection Inject 0.22 mLs (22 Units total) into the skin 2 (two) times daily. Qty: 10 mL, Refills: 3      CONTINUE these medications which have NOT CHANGED   Details  acetaminophen (TYLENOL) 325 MG tablet Take 650 mg by mouth every 6 (six) hours as needed for mild pain.    paricalcitol (ZEMPLAR) 1 MCG capsule Take 1 mcg by mouth daily.    rosuvastatin (CRESTOR) 10 MG tablet Take 10 mg by mouth daily.    sitaGLIPtin (JANUVIA) 50 MG tablet Take 50 mg by mouth daily.      STOP taking these medications     clindamycin (CLEOCIN) 150 MG capsule        No Known Allergies Follow-up Information    Follow up with DUDA,MARCUS V, MD In 2 weeks.   Specialty:  Orthopedic Surgery   Why:  Office is currently closed (Sunday) Patient is to schedule her appointment on Monday 05/24/2015 when office is open.    Contact information:   [place] [address] [phone]       Follow up with [facility].   Specialty:  Rehabilitation  Why:  Office will call patient to schedule Physical Therapy Appointment. post discharge   Contact information:   [place] [phone]      Follow up with [place].   Specialty:  Nutrition & Diabetics   Why:  Office will contact patient post discharge to schedule appointment   Contact information:   [place] [phone]      Follow up with [facility] On 06/01/2015.   Why:  Appointment scheduled for [facility] Tuesday 11/29 at 10:00 with Dr. Jarold Song to establish care   Contact information:   [address] [place]  [phone]      The results of significant diagnostics from this  hospitalization (including imaging, microbiology, ancillary and laboratory) are listed below for reference.    Significant Diagnostic Studies: Ct Abdomen Pelvis Wo Contrast  05/18/2015  CLINICAL DATA:  New onset abdominal pain EXAM: CT ABDOMEN AND PELVIS WITHOUT CONTRAST TECHNIQUE: Multidetector CT imaging of the abdomen and pelvis was performed following the standard protocol without IV contrast. COMPARISON:  None. FINDINGS: Lung bases are free of acute infiltrate or sizable effusion. A calcified granuloma is noted in the right lung base. The gallbladder, liver, spleen, adrenal glands and pancreas are within normal limits. The kidneys show mild fullness of the collecting systems bilaterally. This is likely related to the distended bladder as no definitive ureteral calculi are seen. The bladder is over distended. No filling defect is noted. Small amount of air is noted within the bladder which may be related to recent instrumentation. Clinical correlation is recommended. Diffuse vascular calcifications are noted throughout the uterus. No pelvic mass lesion is noted. No free pelvic fluid is seen. The appendix is not well visualized. No inflammatory changes are identified. Aortoiliac calcifications are noted without aneurysmal dilatation. The osseous structures show no acute abnormality. IMPRESSION: No acute abnormality noted. The bladder is distended with associated mild hydronephrosis bilaterally. Electronically Signed   By: Inez Catalina M.D.   On: 05/18/2015 07:41   Dg Foot 2 Views Left  05/17/2015  CLINICAL DATA:  Chronic left third and fourth toe open wound for 2 months, with pain. Assess for osteomyelitis. Initial encounter. EXAM: LEFT FOOT - 2 VIEW COMPARISON:  None. FINDINGS: There is near complete absence of the third distal phalanx, with vague lucency at the third middle phalanx. This is compatible with osteomyelitis. No definite additional erosions are seen. Soft tissue swelling is noted about the  third toe. Visualized joint spaces are otherwise preserved. Small plantar and posterior calcaneal spurs are seen. IMPRESSION: Near complete absence of the third distal phalanx, with vague lucency at the third middle phalanx. This is compatible with osteomyelitis. Soft tissue swelling about the third toe. Electronically Signed   By: Garald Balding M.D.   On: 05/17/2015 23:00    Microbiology: Recent Results (from the past 240 hour(s))  Culture, blood (routine x 2)     Status: None (Preliminary result)   Collection Time: 05/18/15  6:53 AM  Result Value Ref Range Status   Specimen Description BLOOD LEFT HAND  Final   Special Requests IN PEDIATRIC BOTTLE  3CC  Final   Culture NO GROWTH 4 DAYS  Final   Report Status PENDING  Incomplete  Culture, blood (routine x 2)     Status: None (Preliminary result)   Collection Time:  05/18/15  6:59 AM  Result Value Ref Range Status   Specimen Description BLOOD RIGHT ARM  Final   Special Requests IN PEDIATRIC BOTTLE  3CC  Final   Culture NO GROWTH 4 DAYS  Final   Report Status PENDING  Incomplete  MRSA PCR Screening     Status: None   Collection Time: 05/21/15 11:50 AM  Result Value Ref Range Status   MRSA by PCR NEGATIVE NEGATIVE Final    Comment:        The GeneXpert MRSA Assay (FDA approved for NASAL specimens only), is one component of a comprehensive MRSA colonization surveillance program. It is not intended to diagnose MRSA infection nor to guide or monitor treatment for MRSA infections.      Labs: Basic Metabolic Panel:  Recent Labs Lab 05/18/15 0150 05/19/15 0615 05/21/15 0542 05/22/15 0428 05/23/15 0417  NA 138 142 138 138 138  K 3.9 4.1 4.1 3.8 3.7  CL 103 111 109 107 109  CO2 24 22 22  21* 22  GLUCOSE 82 141* 172* 180* 167*  BUN 40* 23* 18 16 18   CREATININE 2.31* 2.04* 2.30* 2.21* 2.15*  CALCIUM 9.0 8.7* 8.5* 8.3* 8.6*   Liver Function Tests:  Recent Labs Lab 05/17/15 1754  AST 26  ALT 16  ALKPHOS 90  BILITOT 0.4   PROT 7.5  ALBUMIN 3.4*    Recent Labs Lab 05/18/15 0150  LIPASE 32   No results for input(s): AMMONIA in the last 168 hours. CBC:  Recent Labs Lab 05/17/15 1754 05/18/15 0150 05/19/15 0615 05/22/15 0428 05/23/15 0417  WBC 7.5 8.0 6.5 7.3 7.1  HGB 11.0* 10.5* 10.0* 9.6* 8.8*  HCT 34.2* 32.5* 30.3* 29.5* 26.9*  MCV 87.7 88.8 86.8 86.0 86.2  PLT 249 250 210 211 217   CBG:  Recent Labs Lab 05/22/15 1216 05/22/15 1644 05/22/15 2050 05/23/15 0754 05/23/15 1220  GLUCAP 176* 194* 175* 149* 194*     Signed:  Barton Dubois  [facility] 05/23/2015, 1:57 PM

## 2015-05-24 ENCOUNTER — Encounter (HOSPITAL_COMMUNITY): Payer: Self-pay | Admitting: Orthopedic Surgery

## 2015-06-01 ENCOUNTER — Encounter: Payer: Self-pay | Admitting: Family Medicine

## 2015-06-01 ENCOUNTER — Ambulatory Visit: Payer: Self-pay | Attending: Family Medicine | Admitting: Family Medicine

## 2015-06-01 VITALS — BP 109/66 | HR 77 | Temp 98.2°F | Resp 13 | Ht 59.5 in | Wt 155.2 lb

## 2015-06-01 DIAGNOSIS — I129 Hypertensive chronic kidney disease with stage 1 through stage 4 chronic kidney disease, or unspecified chronic kidney disease: Secondary | ICD-10-CM | POA: Insufficient documentation

## 2015-06-01 DIAGNOSIS — E785 Hyperlipidemia, unspecified: Secondary | ICD-10-CM | POA: Insufficient documentation

## 2015-06-01 DIAGNOSIS — N179 Acute kidney failure, unspecified: Secondary | ICD-10-CM

## 2015-06-01 DIAGNOSIS — E1121 Type 2 diabetes mellitus with diabetic nephropathy: Secondary | ICD-10-CM

## 2015-06-01 DIAGNOSIS — E1169 Type 2 diabetes mellitus with other specified complication: Secondary | ICD-10-CM

## 2015-06-01 DIAGNOSIS — E119 Type 2 diabetes mellitus without complications: Secondary | ICD-10-CM | POA: Insufficient documentation

## 2015-06-01 DIAGNOSIS — E1165 Type 2 diabetes mellitus with hyperglycemia: Secondary | ICD-10-CM | POA: Insufficient documentation

## 2015-06-01 DIAGNOSIS — N184 Chronic kidney disease, stage 4 (severe): Secondary | ICD-10-CM

## 2015-06-01 DIAGNOSIS — IMO0002 Reserved for concepts with insufficient information to code with codable children: Secondary | ICD-10-CM

## 2015-06-01 DIAGNOSIS — E079 Disorder of thyroid, unspecified: Secondary | ICD-10-CM | POA: Insufficient documentation

## 2015-06-01 DIAGNOSIS — Z89422 Acquired absence of other left toe(s): Secondary | ICD-10-CM

## 2015-06-01 DIAGNOSIS — N189 Chronic kidney disease, unspecified: Secondary | ICD-10-CM | POA: Insufficient documentation

## 2015-06-01 DIAGNOSIS — Z794 Long term (current) use of insulin: Secondary | ICD-10-CM | POA: Insufficient documentation

## 2015-06-01 DIAGNOSIS — Z79899 Other long term (current) drug therapy: Secondary | ICD-10-CM | POA: Insufficient documentation

## 2015-06-01 DIAGNOSIS — I10 Essential (primary) hypertension: Secondary | ICD-10-CM

## 2015-06-01 LAB — CBC WITH DIFFERENTIAL/PLATELET
BASOS PCT: 1 % (ref 0–1)
Basophils Absolute: 0.1 10*3/uL (ref 0.0–0.1)
EOS ABS: 0.2 10*3/uL (ref 0.0–0.7)
Eosinophils Relative: 3 % (ref 0–5)
HCT: 33.6 % — ABNORMAL LOW (ref 36.0–46.0)
Hemoglobin: 11.1 g/dL — ABNORMAL LOW (ref 12.0–15.0)
Lymphocytes Relative: 21 % (ref 12–46)
Lymphs Abs: 1.4 10*3/uL (ref 0.7–4.0)
MCH: 28.7 pg (ref 26.0–34.0)
MCHC: 33 g/dL (ref 30.0–36.0)
MCV: 86.8 fL (ref 78.0–100.0)
MONO ABS: 0.5 10*3/uL (ref 0.1–1.0)
MONOS PCT: 8 % (ref 3–12)
MPV: 9.8 fL (ref 8.6–12.4)
Neutro Abs: 4.6 10*3/uL (ref 1.7–7.7)
Neutrophils Relative %: 67 % (ref 43–77)
PLATELETS: 389 10*3/uL (ref 150–400)
RBC: 3.87 MIL/uL (ref 3.87–5.11)
RDW: 14.6 % (ref 11.5–15.5)
WBC: 6.8 10*3/uL (ref 4.0–10.5)

## 2015-06-01 LAB — COMPREHENSIVE METABOLIC PANEL
ALK PHOS: 112 U/L (ref 33–130)
ALT: 14 U/L (ref 6–29)
AST: 22 U/L (ref 10–35)
Albumin: 3.2 g/dL — ABNORMAL LOW (ref 3.6–5.1)
BILIRUBIN TOTAL: 0.4 mg/dL (ref 0.2–1.2)
BUN: 71 mg/dL — AB (ref 7–25)
CALCIUM: 9 mg/dL (ref 8.6–10.4)
CO2: 24 mmol/L (ref 20–31)
Chloride: 102 mmol/L (ref 98–110)
Creat: 3.15 mg/dL — ABNORMAL HIGH (ref 0.50–0.99)
GLUCOSE: 189 mg/dL — AB (ref 65–99)
Potassium: 4.6 mmol/L (ref 3.5–5.3)
Sodium: 137 mmol/L (ref 135–146)
TOTAL PROTEIN: 6.8 g/dL (ref 6.1–8.1)

## 2015-06-01 LAB — GLUCOSE, POCT (MANUAL RESULT ENTRY): POC GLUCOSE: 195 mg/dL — AB (ref 70–99)

## 2015-06-01 NOTE — Patient Instructions (Signed)
Diabetes Mellitus and Food It is important for you to manage your blood sugar (glucose) level. Your blood glucose level can be greatly affected by what you eat. Eating healthier foods in the appropriate amounts throughout the day at about the same time each day will help you control your blood glucose level. It can also help slow or prevent worsening of your diabetes mellitus. Healthy eating may even help you improve the level of your blood pressure and reach or maintain a healthy weight.  General recommendations for healthful eating and cooking habits include:  Eating meals and snacks regularly. Avoid going long periods of time without eating to lose weight.  Eating a diet that consists mainly of plant-based foods, such as fruits, vegetables, nuts, legumes, and whole grains.  Using low-heat cooking methods, such as baking, instead of high-heat cooking methods, such as deep frying. Work with your dietitian to make sure you understand how to use the Nutrition Facts information on food labels. HOW CAN FOOD AFFECT ME? Carbohydrates Carbohydrates affect your blood glucose level more than any other type of food. Your dietitian will help you determine how many carbohydrates to eat at each meal and teach you how to count carbohydrates. Counting carbohydrates is important to keep your blood glucose at a healthy level, especially if you are using insulin or taking certain medicines for diabetes mellitus. Alcohol Alcohol can cause sudden decreases in blood glucose (hypoglycemia), especially if you use insulin or take certain medicines for diabetes mellitus. Hypoglycemia can be a life-threatening condition. Symptoms of hypoglycemia (sleepiness, dizziness, and disorientation) are similar to symptoms of having too much alcohol.  If your health care provider has given you approval to drink alcohol, do so in moderation and use the following guidelines:  Women should not have more than one drink per day, and men  should not have more than two drinks per day. One drink is equal to:  12 oz of beer.  5 oz of wine.  1 oz of hard liquor.  Do not drink on an empty stomach.  Keep yourself hydrated. Have water, diet soda, or unsweetened iced tea.  Regular soda, juice, and other mixers might contain a lot of carbohydrates and should be counted. WHAT FOODS ARE NOT RECOMMENDED? As you make food choices, it is important to remember that all foods are not the same. Some foods have fewer nutrients per serving than other foods, even though they might have the same number of calories or carbohydrates. It is difficult to get your body what it needs when you eat foods with fewer nutrients. Examples of foods that you should avoid that are high in calories and carbohydrates but low in nutrients include:  Trans fats (most processed foods list trans fats on the Nutrition Facts label).  Regular soda.  Juice.  Candy.  Sweets, such as cake, pie, doughnuts, and cookies.  Fried foods. WHAT FOODS CAN I EAT? Eat nutrient-rich foods, which will nourish your body and keep you healthy. The food you should eat also will depend on several factors, including:  The calories you need.  The medicines you take.  Your weight.  Your blood glucose level.  Your blood pressure level.  Your cholesterol level. You should eat a variety of foods, including:  Protein.  Lean cuts of meat.  Proteins low in saturated fats, such as fish, egg whites, and beans. Avoid processed meats.  Fruits and vegetables.  Fruits and vegetables that may help control blood glucose levels, such as apples, mangoes, and   yams.  Dairy products.  Choose fat-free or low-fat dairy products, such as milk, yogurt, and cheese.  Grains, bread, pasta, and rice.  Choose whole grain products, such as multigrain bread, whole oats, and brown rice. These foods may help control blood pressure.  Fats.  Foods containing healthful fats, such as nuts,  avocado, olive oil, canola oil, and fish. DOES EVERYONE WITH DIABETES MELLITUS HAVE THE SAME MEAL PLAN? Because every person with diabetes mellitus is different, there is not one meal plan that works for everyone. It is very important that you meet with a dietitian who will help you create a meal plan that is just right for you.   This information is not intended to replace advice given to you by your health care provider. Make sure you discuss any questions you have with your health care provider.   Document Released: 03/16/2005 Document Revised: 07/10/2014 Document Reviewed: 05/16/2013 Elsevier Interactive Patient Education 2016 Elsevier Inc.  

## 2015-06-01 NOTE — Progress Notes (Signed)
CC: Follow-up from hospitalization (05/17/15-05/23/15)  HPI: Sarah Kelley is a 64 y.o. female with a history of type 2 diabetes mellitus, CK D, hypertension, hyperlipidemia who was recently hospitalized at [facility] after she was referred to the ED by wound care due to purulent discharge from the tip of her toe. Left lower extremity Doppler was negative for DVT, X-ray revealed left foot osteomyelitis and orthopedics was consulted; she underwent left foot surgery amputation on 05/21/15.  She did have elevated creatinine which peaked at 2.49 and was thought to be secondary to acute on chronic renal failure. Postop course was uneventful and she was subsequently discharged with a Darco shoe and recommendation was to follow-up with orthopedics in 2 weeks.  Interval history: She is supposed to follow-up with orthopedics-Dr. Sharol Given in 2 weeks but she never got an appointment on discharge. Her PCP is at [facility] and she is unable to recall the name of her PCP.Patient has been unable to afford Crestor and is requesting a substitute.   No Known Allergies Past Medical History  Diagnosis Date  . Diabetes mellitus   . Hypertension   . Hyperlipidemia   . Renal disorder   . Thyroid disease    Current Outpatient Prescriptions on File Prior to Visit  Medication Sig Dispense Refill  . acetaminophen (TYLENOL) 325 MG tablet Take 650 mg by mouth every 6 (six) hours as needed for mild pain.    Marland Kitchen amLODipine (NORVASC) 5 MG tablet Take 1.5 tablets (7.5 mg total) by mouth daily. 45 tablet 2  . doxycycline (VIBRA-TABS) 100 MG tablet Take 1 tablet (100 mg total) by mouth every 12 (twelve) hours. 20 tablet 0  . insulin glargine (LANTUS) 100 UNIT/ML injection Inject 0.22 mLs (22 Units total) into the skin 2 (two) times daily. 10 mL 3  . iron polysaccharides (NIFEREX) 150 MG capsule Take 1 capsule (150 mg total) by mouth 2 (two) times daily. 60 capsule 0  . oxyCODONE-acetaminophen (PERCOCET/ROXICET)  5-325 MG tablet Take 1-2 tablets by mouth every 6 (six) hours as needed for severe pain. 40 tablet 0  . paricalcitol (ZEMPLAR) 1 MCG capsule Take 1 mcg by mouth daily.    . rosuvastatin (CRESTOR) 10 MG tablet Take 10 mg by mouth daily.    . sitaGLIPtin (JANUVIA) 50 MG tablet Take 50 mg by mouth daily.     No current facility-administered medications on file prior to visit.   No family history on file. Social History   Social History  . Marital Status: Married    Spouse Name: N/A  . Number of Children: N/A  . Years of Education: N/A   Occupational History  . Not on file.   Social History Main Topics  . Smoking status: Never Smoker   . Smokeless tobacco: Never Used  . Alcohol Use: No  . Drug Use: No  . Sexual Activity: Not on file   Other Topics Concern  . Not on file   Social History Narrative    Review of Systems: Constitutional: Negative for fever, chills, diaphoresis, activity change, appetite change and fatigue. HENT: Negative for ear pain, nosebleeds, congestion, facial swelling, rhinorrhea, neck pain, neck stiffness and ear discharge.  Eyes: Negative for pain, discharge, redness, itching and visual disturbance. Respiratory: Negative for cough, choking, chest tightness, shortness of breath, wheezing and stridor.  Cardiovascular: Negative for chest pain, palpitations and leg swelling. Gastrointestinal: Negative for abdominal distention. Genitourinary: Negative for dysuria, urgency, frequency, hematuria, flank pain, decreased urine volume, difficulty urinating and  dyspareunia.  Musculoskeletal: see hpi Neurological: Negative for dizziness, tremors, seizures, syncope, facial asymmetry, speech difficulty, weakness, light-headedness, numbness and headaches.  Hematological: Negative for adenopathy. Does not bruise/bleed easily. Psychiatric/Behavioral: Negative for hallucinations, behavioral problems, confusion, dysphoric mood, decreased concentration and agitation.     Objective: Filed Vitals:   06/01/15 1031  BP: 109/66  Pulse: 77  Temp: 98.2 F (36.8 C)  Resp: 13  Height: 4' 11.5" (1.511 m)  Weight: 155 lb 3.2 oz (70.398 kg)  SpO2: 96%      Physical Exam: Constitutional: Patient appears well-developed and well-nourished. No distress. HENT: Normocephalic, atraumatic, External right and left ear normal. Oropharynx is clear and moist.  Eyes: Conjunctivae and EOM are normal. PERRLA, no scleral icterus. Neck: Normal ROM. Neck supple. No JVD. No tracheal deviation. No thyromegaly. CVS: RRR, S1/S2 +, no murmurs, no gallops, no carotid bruit.  Pulmonary: Effort and breath sounds normal, no stridor, rhonchi, wheezes, rales.  Abdominal: Soft. BS +,  no distension, tenderness, rebound or guarding.  Musculoskeletal: Site of amputation of left third toe healing well with sutures in place and dried blood, no evidence of infection.  Lymphadenopathy: No lymphadenopathy noted, cervical, inguinal or axillary Neuro: Alert. Normal reflexes, muscle tone coordination. No cranial nerve deficit. Skin: Skin is warm and dry. No rash noted. Not diaphoretic. No erythema. No pallor. Psychiatric: Normal mood and affect. Behavior, judgment, thought content normal.  Lab Results  Component Value Date   WBC 7.1 05/23/2015   HGB 8.8* 05/23/2015   HCT 26.9* 05/23/2015   MCV 86.2 05/23/2015   PLT 217 05/23/2015   Lab Results  Component Value Date   CREATININE 2.15* 05/23/2015   BUN 18 05/23/2015   NA 138 05/23/2015   K 3.7 05/23/2015   CL 109 05/23/2015   CO2 22 05/23/2015    Lab Results  Component Value Date   HGBA1C 9.8* 05/18/2015   Lipid Panel     Component Value Date/Time   CHOL 134 05/18/2015 0150   TRIG 160* 05/18/2015 0150   HDL 38* 05/18/2015 0150   CHOLHDL 3.5 05/18/2015 0150   VLDL 32 05/18/2015 0150   LDLCALC 64 05/18/2015 0150       Assessment and plan:   Status post toe amputation: Currently on Doxycycline Patient was discharged  without orthopedic follow-up. We will call Dr. Jess Barters office to obtain a follow-up appointment for this patient for evaluation of surgical site as well as suture removal.  Type 2 diabetes mellitus: Uncontrolled with A1c of 9.8. She does have a primary care physician admits in [place] who will be managing her diabetes and I have advised her to schedule an appointment as soon as possible  Acute on chronic kidney disease: Complete metabolic panel ordered to evaluate for improvement in renal function. Cannot exclude Vancomycin induced ATN She would likely need to see nephrology but this will be deferred to her PCP for referral.  Hypertension: Controlled.  Hyperlipidemia: Unable to afford Crestor. She uses the pharmacy at [facility] at her PCPs office and so I have advised her to discuss this with the PCP who would be switching her to another statin.    Arnoldo Morale, [facility] [phone] 06/01/2015, 10:26 AM

## 2015-06-01 NOTE — Progress Notes (Signed)
Video interpreter service used for translation-son and husband present in room Hospital follow up for left 3rd toe amputation secondary to osteomyelitis Pain is 4 out of 10 this am Patient asking for [place] to remove stiches and change dressing

## 2015-06-02 ENCOUNTER — Other Ambulatory Visit: Payer: Self-pay | Admitting: Family Medicine

## 2015-06-02 MED ORDER — INSULIN GLARGINE 100 UNIT/ML ~~LOC~~ SOLN: 25.0000 [IU] | Freq: Two times a day (BID) | SUBCUTANEOUS | Status: DC

## 2015-06-02 NOTE — Progress Notes (Signed)
Please inform her to discontinue Januvia due to worsening of kidney function and increase Lantus to 25 units twice daily. Thank you.

## 2015-06-04 ENCOUNTER — Telehealth: Payer: Self-pay | Admitting: *Deleted

## 2015-06-04 NOTE — Telephone Encounter (Signed)
Dr. Jarold Song under the impression that this patient had PCP in [place] and wanted this RN to call with lab results of worsening kidney function.  RN informed MD that patient scheduled an appointment to see her on 06/14/15 to establish care.  Dr. Jarold Song stated that RN should call patient and inform her of kidney function and that it would be addressed at her next office visit.  PI id number [id number] placed phone call.  Patient not available according to man who answered phone Interpreter left this RN's return phone number and explained that when she called RN would call language line.

## 2015-06-04 NOTE — Telephone Encounter (Signed)
-----   Message from Arnoldo Morale, MD sent at 06/02/2015  8:25 AM EST ----- Anemia has improved but renal function is worsening; please advise to schedule an appointment with her PCP at [facility] ASAP as she will need to be referred to a Nephrologist.

## 2015-06-07 ENCOUNTER — Telehealth: Payer: Self-pay | Admitting: *Deleted

## 2015-06-07 NOTE — Telephone Encounter (Signed)
PI [id number] with Clifton James, patient's husband, patient gave verbal consent.  Explained to patient per Dr. Jarold Song that she is to stop taking her Januvia and increase her Lantus to 25 units twice a day at breakfast and at bedtime due to patient's worsening kidney function.  Patient's husband verbally repeated the instructions and was concerned that her blood sugar would be too low now that her Lantus was being increased.  Explained that since we were stopping the Januvia that this RN felt she would be fine.  Told husband to bring blood sugar log to appointment scheduled for the 12th of December so MD could review.  Also told him to make sure patient eats regular meals and drinks plenty of water.  He verbalized understanding.

## 2015-06-07 NOTE — Telephone Encounter (Signed)
[place] interpreter # [phone]  Patient husband answered call and wanted RN to give him results.  RN explained that HIPAA prevents that unless his wife can verbalize that that is all right with her.  Husband put wife on the phone and RN asked her to verify her birthday.  Patient states she does not know her birthdate and that she cannot read.  Patient verbalized that it is ok for medical information be shared with husband because she cannot read and does not understand her information.  Results given to husband regarding worsening kidney function and improved anemia.  RN confirmed that he knew patient had an appointment on 06/14/15 at 1130.  He verified that they will come to appointment and he wants to be referred to kidney doctor in [place].  Up until know she has see nephrologist is [facility].  Explained that MD would discuss this with them at the appointment on 12/12.  He verbalized understanding.

## 2015-06-07 NOTE — Telephone Encounter (Signed)
-----   Message from Arnoldo Morale, MD sent at 06/02/2015  8:25 AM EST ----- Anemia has improved but renal function is worsening; please advise to schedule an appointment with her PCP at [facility] ASAP as she will need to be referred to a Nephrologist.

## 2015-06-14 ENCOUNTER — Encounter: Payer: Self-pay | Admitting: Family Medicine

## 2015-06-14 ENCOUNTER — Ambulatory Visit: Payer: Self-pay | Attending: Family Medicine | Admitting: Family Medicine

## 2015-06-14 VITALS — BP 146/82 | HR 67 | Temp 97.5°F | Resp 13 | Ht 59.5 in | Wt 155.0 lb

## 2015-06-14 DIAGNOSIS — Z794 Long term (current) use of insulin: Secondary | ICD-10-CM | POA: Insufficient documentation

## 2015-06-14 DIAGNOSIS — R5383 Other fatigue: Secondary | ICD-10-CM

## 2015-06-14 DIAGNOSIS — Z89422 Acquired absence of other left toe(s): Secondary | ICD-10-CM | POA: Insufficient documentation

## 2015-06-14 DIAGNOSIS — E1121 Type 2 diabetes mellitus with diabetic nephropathy: Secondary | ICD-10-CM

## 2015-06-14 DIAGNOSIS — E785 Hyperlipidemia, unspecified: Secondary | ICD-10-CM

## 2015-06-14 DIAGNOSIS — I10 Essential (primary) hypertension: Secondary | ICD-10-CM

## 2015-06-14 DIAGNOSIS — R739 Hyperglycemia, unspecified: Secondary | ICD-10-CM

## 2015-06-14 DIAGNOSIS — I129 Hypertensive chronic kidney disease with stage 1 through stage 4 chronic kidney disease, or unspecified chronic kidney disease: Secondary | ICD-10-CM | POA: Insufficient documentation

## 2015-06-14 DIAGNOSIS — IMO0002 Reserved for concepts with insufficient information to code with codable children: Secondary | ICD-10-CM

## 2015-06-14 DIAGNOSIS — N184 Chronic kidney disease, stage 4 (severe): Secondary | ICD-10-CM | POA: Insufficient documentation

## 2015-06-14 DIAGNOSIS — D649 Anemia, unspecified: Secondary | ICD-10-CM | POA: Insufficient documentation

## 2015-06-14 DIAGNOSIS — E079 Disorder of thyroid, unspecified: Secondary | ICD-10-CM | POA: Insufficient documentation

## 2015-06-14 DIAGNOSIS — E1122 Type 2 diabetes mellitus with diabetic chronic kidney disease: Secondary | ICD-10-CM

## 2015-06-14 LAB — GLUCOSE, POCT (MANUAL RESULT ENTRY): POC GLUCOSE: 318 mg/dL — AB (ref 70–99)

## 2015-06-14 LAB — TSH: TSH: 2.316 u[IU]/mL (ref 0.350–4.500)

## 2015-06-14 MED ORDER — GABAPENTIN 100 MG PO CAPS: 200.0000 mg | ORAL_CAPSULE | Freq: Every day | ORAL | Status: DC

## 2015-06-14 MED ORDER — VALSARTAN 160 MG PO TABS: 160.0000 mg | ORAL_TABLET | Freq: Every day | ORAL | Status: DC

## 2015-06-14 MED ORDER — OLMESARTAN MEDOXOMIL 40 MG PO TABS: 40.0000 mg | ORAL_TABLET | Freq: Every day | ORAL | Status: DC

## 2015-06-14 NOTE — Progress Notes (Signed)
Subjective:    Patient ID: Sarah Kelley, female    DOB: 1950-12-25, 64 y.o.   MRN: [id number]  HPI 64 year old Spanish speaking female seen with the aid of a language line with a history of HTN, uncontrolled Type 2DM,Hyperlipidemia, CKD , Diabetic Neuropathy, Left  foot 3rd toe Osteomyelitis s/p amputation who comes in for a follow up office visit.  She has been to see her Orthopedic Surgeon - Dr Sharol Given for a follow up and has been having her foot dressed by her husband daily.  Her Januvia was discontinued prior to this visit due to worsening renal function (cr elevation from 2.15 to 3.15 in 2 weeks) and she was supposed to administer Lantus 25 units twice daily but has been taking 25 units in the morning and 20 units in the evening. Blood sugar log reviewed reveals fasting sugars in the 120 to 160 range with some values in the 190s. CBG is 318 in the clinic and her last meal was 3 hours ago. She complains of fatigue which is usually resolved by her having to lie down to rest but her son states this is not new for her.   Past Medical History  Diagnosis Date  . Diabetes mellitus   . Hypertension   . Hyperlipidemia   . Renal disorder   . Thyroid disease     Past Surgical History  Procedure Laterality Date  . Amputation Left 05/21/2015    Procedure: Left Foot 3rd Ray Amputation;  Surgeon: Newt Minion, MD;  Location: [facility];  Service: Orthopedics;  Laterality: Left;    Social History   Social History  . Marital Status: Married    Spouse Name: N/A  . Number of Children: N/A  . Years of Education: N/A   Occupational History  . Not on file.   Social History Main Topics  . Smoking status: Never Smoker   . Smokeless tobacco: Never Used  . Alcohol Use: No  . Drug Use: No  . Sexual Activity: Not on file   Other Topics Concern  . Not on file   Social History Narrative    No Known Allergies  Current Outpatient Prescriptions on File Prior to Visit  Medication Sig  Dispense Refill  . acetaminophen (TYLENOL) 325 MG tablet Take 650 mg by mouth every 6 (six) hours as needed for mild pain.    Marland Kitchen amLODipine (NORVASC) 5 MG tablet Take 1.5 tablets (7.5 mg total) by mouth daily. 45 tablet 2  . insulin glargine (LANTUS) 100 UNIT/ML injection Inject 0.25 mLs (25 Units total) into the skin 2 (two) times daily. 10 mL 1  . oxyCODONE-acetaminophen (PERCOCET/ROXICET) 5-325 MG tablet Take 1-2 tablets by mouth every 6 (six) hours as needed for severe pain. 40 tablet 0  . rosuvastatin (CRESTOR) 10 MG tablet Take 10 mg by mouth daily.    . iron polysaccharides (NIFEREX) 150 MG capsule Take 1 capsule (150 mg total) by mouth 2 (two) times daily. (Patient not taking: Reported on 06/14/2015) 60 capsule 0  . paricalcitol (ZEMPLAR) 1 MCG capsule Take 1 mcg by mouth daily.     No current facility-administered medications on file prior to visit.       Review of Systems Constitutional: Negative for fever, chills, diaphoresis, activity change, appetite change and positive for fatigue. HENT: Negative for ear pain, nosebleeds, congestion, facial swelling, rhinorrhea, neck pain, neck stiffness and ear discharge.  Eyes: Negative for pain, discharge, redness, itching and visual disturbance. Respiratory: Negative for cough,  choking, chest tightness, shortness of breath, wheezing and stridor.  Cardiovascular: Negative for chest pain, palpitations and leg swelling. Gastrointestinal: Negative for abdominal distention. Genitourinary: Negative for dysuria, urgency, frequency, hematuria, flank pain, decreased urine volume, difficulty urinating and dyspareunia.  Musculoskeletal: see hpi Neurological: Negative for dizziness, tremors, seizures, syncope, facial asymmetry, speech difficulty, weakness, light-headedness, numbness and headaches.  Hematological: Negative for adenopathy. Does not bruise/bleed easily. Psychiatric/Behavioral: Negative for hallucinations, behavioral problems, confusion,  dysphoric mood, decreased concentration and agitation.      Objective: Filed Vitals:   06/14/15 1151  BP: 146/82  Pulse: 67  Temp: 97.5 F (36.4 C)  Resp: 13  Height: 4' 11.5" (1.511 m)  Weight: 155 lb (70.308 kg)  SpO2: 99%      Physical Exam Constitutional: Patient appears well-developed and well-nourished. No distress. CVS: RRR, S1/S2 +, no murmurs, no gallops, no carotid bruit.  Pulmonary: Effort and breath sounds normal, no stridor, rhonchi, wheezes, rales.  Abdominal: Soft. BS +,  no distension, tenderness, rebound or guarding.  Musculoskeletal: Site of amputation of left third toe healing well and dried blood, no evidence of infection.  Lymphadenopathy: No lymphadenopathy noted, cervical, inguinal or axillary Neuro: Alert. Normal reflexes, muscle tone coordination. No cranial nerve deficit. Skin: Skin is warm and dry. No rash noted. Not diaphoretic. No erythema. No pallor. Psychiatric: Normal mood and affect. Behavior, judgment, thought content normal.   CMP Latest Ref Rng 06/01/2015 05/23/2015 05/22/2015  Glucose 65 - 99 mg/dL 189(H) 167(H) 180(H)  BUN 7 - 25 mg/dL 71(H) 18 16  Creatinine 0.50 - 0.99 mg/dL 3.15(H) 2.15(H) 2.21(H)  Sodium 135 - 146 mmol/L 137 138 138  Potassium 3.5 - 5.3 mmol/L 4.6 3.7 3.8  Chloride 98 - 110 mmol/L 102 109 107  CO2 20 - 31 mmol/L 24 22 21(L)  Calcium 8.6 - 10.4 mg/dL 9.0 8.6(L) 8.3(L)  Total Protein 6.1 - 8.1 g/dL 6.8 - -  Total Bilirubin 0.2 - 1.2 mg/dL 0.4 - -  Alkaline Phos 33 - 130 U/L 112 - -  AST 10 - 35 U/L 22 - -  ALT 6 - 29 U/L 14 - -        Assessment & Plan:  Status post toe amputation: Completed her course of doxycycline and has been to see her orthopedic surgeon for follow-up. Healing well. Continue daily wound dressing changes; dressing changes performed in the clinic today. Advised to keep upcoming appointment with Dr. Sharol Given for 2 weeks.   Type 2 diabetes mellitus: Uncontrolled with A1c of 9.8 Advised to  administer Correct dose of Lantus-25 units twice daily and to notify the clinic in the event of any hypoglycemia.  Acute on chronic kidney disease: I have discontinued Lasix which she had been taking with no clear indication as this may have contributed to her worsening renal function. Will also refer to nephrology.  Hypertension: Controlled.  Hyperlipidemia: Unable to afford Crestor. When she completes her course of Crestor I will switch her to atorvastatin.  Fatigue: Labs reveal mild anemia Will send off TSH and vitamin D to evaluate.  This note has been created with Surveyor, quantity. Any transcriptional errors are unintentional.

## 2015-06-14 NOTE — Progress Notes (Signed)
Patient here to f/u on her DM CBG 318-patient unable to provide urine sample Spouse speaks for patient and states "she'd in a little bit of pain in her foot." He has Rx for Lasix and gabapentin as well as benicar that he has questions about She is seeing her surgeon in 2 weeks for her foot.

## 2015-06-15 ENCOUNTER — Telehealth: Payer: Self-pay

## 2015-06-15 LAB — VITAMIN D 25 HYDROXY (VIT D DEFICIENCY, FRACTURES): Vit D, 25-Hydroxy: 30 ng/mL (ref 30–100)

## 2015-06-15 NOTE — Telephone Encounter (Signed)
[place] called [place] interpreters and spoke with [place] [phone]. Pablo left a message for the patient to return call when she receive the voice message.

## 2015-06-15 NOTE — Telephone Encounter (Signed)
-----   Message from Arnoldo Morale, MD sent at 06/15/2015  9:07 AM EST ----- Thyroid and vitamin D labs are normal.

## 2015-06-17 NOTE — Telephone Encounter (Signed)
C[facility] called [place] interpreter and spoke with Sheldon Silvan [phone]. Call was placed, to give patient her lab results, but patient didn't not answer. This is the 2nd attempt. Patient will be sent a letter to address on file.

## 2015-06-30 ENCOUNTER — Encounter: Payer: Self-pay | Admitting: Family Medicine

## 2015-06-30 ENCOUNTER — Ambulatory Visit: Payer: Self-pay | Attending: Family Medicine | Admitting: Family Medicine

## 2015-06-30 VITALS — BP 147/70 | HR 72 | Temp 98.8°F | Resp 16 | Ht 59.5 in | Wt 160.0 lb

## 2015-06-30 DIAGNOSIS — E079 Disorder of thyroid, unspecified: Secondary | ICD-10-CM | POA: Insufficient documentation

## 2015-06-30 DIAGNOSIS — E119 Type 2 diabetes mellitus without complications: Secondary | ICD-10-CM

## 2015-06-30 DIAGNOSIS — E0849 Diabetes mellitus due to underlying condition with other diabetic neurological complication: Secondary | ICD-10-CM

## 2015-06-30 DIAGNOSIS — I10 Essential (primary) hypertension: Secondary | ICD-10-CM

## 2015-06-30 DIAGNOSIS — I129 Hypertensive chronic kidney disease with stage 1 through stage 4 chronic kidney disease, or unspecified chronic kidney disease: Secondary | ICD-10-CM | POA: Insufficient documentation

## 2015-06-30 DIAGNOSIS — K5909 Other constipation: Secondary | ICD-10-CM

## 2015-06-30 DIAGNOSIS — M79671 Pain in right foot: Secondary | ICD-10-CM | POA: Insufficient documentation

## 2015-06-30 DIAGNOSIS — N179 Acute kidney failure, unspecified: Secondary | ICD-10-CM

## 2015-06-30 DIAGNOSIS — Z794 Long term (current) use of insulin: Secondary | ICD-10-CM | POA: Insufficient documentation

## 2015-06-30 DIAGNOSIS — E785 Hyperlipidemia, unspecified: Secondary | ICD-10-CM

## 2015-06-30 DIAGNOSIS — N184 Chronic kidney disease, stage 4 (severe): Secondary | ICD-10-CM | POA: Insufficient documentation

## 2015-06-30 LAB — BASIC METABOLIC PANEL
BUN: 29 mg/dL — AB (ref 7–25)
CO2: 22 mmol/L (ref 20–31)
CREATININE: 2.26 mg/dL — AB (ref 0.50–0.99)
Calcium: 8.7 mg/dL (ref 8.6–10.4)
Chloride: 107 mmol/L (ref 98–110)
Glucose, Bld: 240 mg/dL — ABNORMAL HIGH (ref 65–99)
Potassium: 5.4 mmol/L — ABNORMAL HIGH (ref 3.5–5.3)
Sodium: 139 mmol/L (ref 135–146)

## 2015-06-30 LAB — GLUCOSE, POCT (MANUAL RESULT ENTRY): POC GLUCOSE: 209 mg/dL — AB (ref 70–99)

## 2015-06-30 MED ORDER — LACTULOSE 10 GM/15ML PO SOLN: 10.0000 g | Freq: Every day | ORAL | Status: DC | PRN

## 2015-06-30 MED ORDER — GABAPENTIN 300 MG PO CAPS: 300.0000 mg | ORAL_CAPSULE | Freq: Every day | ORAL | Status: DC

## 2015-06-30 NOTE — Progress Notes (Signed)
Subjective:    Patient ID: Sarah Kelley, female    DOB: 1950-12-14, 64 y.o.   MRN: [id number]  HPI 64 year old Spanish speaking female seen with the aid of a language line with a history of HTN, uncontrolled Type 2DM, Hyperlipidemia, CKD , Diabetic Neuropathy, Left  foot 3rd toe Osteomyelitis s/p amputation who comes in for a follow up office visit. She was referred to nephrology but the patient states she is yet to get a call from the specialist; review of her chart indicates a letter was mailed to the patient as to the available options for a nephrologist: [facility] versus [facility].  She was last seen by her orthopedic surgeon-Dr. Sharol Given week ago and complains of pain at the sole of her left foot anterior to the site where her toe was amputated and she also has some pain in her right foot as well. She complains of abdominal discomfort and endorses being constipated but denies nausea, vomiting or diarrhea and she has a normal appetite.  Past Medical History  Diagnosis Date  . Diabetes mellitus   . Hypertension   . Hyperlipidemia   . Renal disorder   . Thyroid disease     Past Surgical History  Procedure Laterality Date  . Amputation Left 05/21/2015    Procedure: Left Foot 3rd Ray Amputation;  Surgeon: Newt Minion, MD;  Location: [facility];  Service: Orthopedics;  Laterality: Left;    Social History   Social History  . Marital Status: Married    Spouse Name: N/A  . Number of Children: N/A  . Years of Education: N/A   Occupational History  . Not on file.   Social History Main Topics  . Smoking status: Never Smoker   . Smokeless tobacco: Never Used  . Alcohol Use: No  . Drug Use: No  . Sexual Activity: Not on file   Other Topics Concern  . Not on file   Social History Narrative    No Known Allergies  Current Outpatient Prescriptions on File Prior to Visit  Medication Sig Dispense Refill  . acetaminophen (TYLENOL) 325 MG tablet Take 650 mg  by mouth every 6 (six) hours as needed for mild pain.    Marland Kitchen amLODipine (NORVASC) 5 MG tablet Take 1.5 tablets (7.5 mg total) by mouth daily. 45 tablet 2  . insulin glargine (LANTUS) 100 UNIT/ML injection Inject 0.25 mLs (25 Units total) into the skin 2 (two) times daily. 10 mL 1  . oxyCODONE-acetaminophen (PERCOCET/ROXICET) 5-325 MG tablet Take 1-2 tablets by mouth every 6 (six) hours as needed for severe pain. 40 tablet 0  . rosuvastatin (CRESTOR) 10 MG tablet Take 10 mg by mouth daily.    . valsartan (DIOVAN) 160 MG tablet Take 1 tablet (160 mg total) by mouth daily. 30 tablet 2  . iron polysaccharides (NIFEREX) 150 MG capsule Take 1 capsule (150 mg total) by mouth 2 (two) times daily. (Patient not taking: Reported on 06/14/2015) 60 capsule 0  . paricalcitol (ZEMPLAR) 1 MCG capsule Take 1 mcg by mouth daily. Reported on 06/30/2015     No current facility-administered medications on file prior to visit.      Review of Systems Constitutional: Negative for fever, chills, diaphoresis, activity change, appetite change and positive for fatigue. HENT: Negative for ear pain, nosebleeds, congestion, facial swelling, rhinorrhea, neck pain, neck stiffness and ear discharge.  Eyes: Negative for pain, discharge, redness, itching and visual disturbance. Respiratory: Negative for cough, choking, chest tightness, shortness  of breath, wheezing and stridor.  Cardiovascular: Negative for chest pain, palpitations and leg swelling. Gastrointestinal: See history of present illness Genitourinary: Negative for dysuria, urgency, frequency, hematuria, flank pain, decreased urine volume, difficulty urinating and dyspareunia.  Musculoskeletal: see hpi Neurological: Negative for dizziness, tremors, seizures, syncope, facial asymmetry, speech difficulty, weakness, light-headedness, numbness and headaches.  Hematological: Negative for adenopathy. Does not bruise/bleed easily. Psychiatric/Behavioral: Negative for  hallucinations, behavioral problems, confusion, dysphoric mood, decreased concentration and agitation.      Objective: Filed Vitals:   06/30/15 1540  BP: 147/70  Pulse: 72  Temp: 98.8 F (37.1 C)  TempSrc: Oral  Resp: 16  Height: 4' 11.5" (1.511 m)  Weight: 160 lb (72.576 kg)  SpO2: 99%      Physical Exam  Constitutional: Patient appears well-developed and well-nourished. No distress. CVS: RRR, S1/S2 +, no murmurs, no gallops, no carotid bruit.  Pulmonary: Effort and breath sounds normal, no stridor, rhonchi, wheezes, rales.  Abdominal: Soft. BS +,  no distension, tenderness, rebound or guarding.  Musculoskeletal: Site of amputation of left third toe healing well , no evidence of infection.  Lymphadenopathy: No lymphadenopathy noted, cervical, inguinal or axillary Neuro: Alert. Normal reflexes, muscle tone coordination. No cranial nerve deficit. Skin: Skin is warm and dry. No rash noted. Not diaphoretic. No erythema. No pallor. Psychiatric: Normal mood and affect. Behavior, judgment, thought content normal.      Assessment & Plan:  Status post toe amputation: Last seen by Dr Sharol Given last week  she does seem to experience some neuropathic pain versus phantom pain and so I have increased her Neurontin dose to 300 mg and we will maintain the bedtime dose as the family states she is  somnolent during the day already.  Type 2 diabetes mellitus: Uncontrolled with A1c of 9.8 Blood sugar log reveals some improvement Continue Lantus-25 units twice daily and to notify the clinic in the event of any hypoglycemia.  Acute on chronic kidney disease: Was previously on Lasix which i discontinued at the last visit as this may have contributed to her worsening renal function.  Discussed the options available to her: [facility] versus [facility]  and the financial implications including the office co-pay and she has chosen to go to [facility].  Hypertension: Slightly above goal of  <140/90 continue antihypertensive, low-sodium, DASH diet.   Hyperlipidemia: Unable to afford Crestor. When she completes her course of Crestor I will switch her to atorvastatin.   Constipation: Placed on lactulose

## 2015-06-30 NOTE — Progress Notes (Signed)
Patient's here for f/up from last OV.   Patient states she's having pain in left foot and toes rated 6/10 described as a stabbing pain that come and goes.

## 2015-06-30 NOTE — Patient Instructions (Signed)
Estreimiento - Adultos (Constipation, Adult) Estreimiento significa que una persona tiene menos de tres evacuaciones en una semana, dificultad para defecar, o que las heces son secas, duras, o ms grandes que lo normal. A medida que envejecemos el estreimiento es ms comn. Una dieta baja en fibra, no tomar suficientes lquidos y el uso de ciertos medicamentos pueden empeorar el estreimiento.  CAUSAS   Ciertos medicamentos, como los antidepresivos, analgsicos, suplementos de hierro, anticidos y diurticos.  Algunas enfermedades, como la diabetes, el sndrome del colon irritable, enfermedad de la tiroides, o depresin.  No beber suficiente agua.  No consumir suficientes alimentos ricos en fibra.  Situaciones de estrs o viajes.  Falta de actividad fsica o de ejercicio.  Ignorar la necesidad sbita de defecar.  Uso en exceso de laxantes. SIGNOS Y SNTOMAS   Defecar menos de tres veces por semana.  Dificultad para defecar.  Tener las heces secas y duras, o ms grandes que las normales.  Sensacin de estar lleno o hinchado.  Dolor en la parte baja del abdomen.  No sentir alivio despus de defecar. DIAGNSTICO  El mdico le har una historia clnica y un examen fsico. Pueden hacerle exmenes adicionales para el estreimiento grave. Estos estudios pueden ser:  Un radiografa con enema de bario para examinar el recto, el colon y, en algunos casos, el intestino delgado.  Una sigmoidoscopia para examinar el colon inferior.  Una colonoscopia para examinar todo el colon. TRATAMIENTO  El tratamiento depender de la gravedad del estreimiento y de la causa. Algunos tratamientos nutricionales son beber ms lquidos y comer ms alimentos ricos en fibra. El cambio en el estilo de vida incluye hacer ejercicios de manera regular. Si estas recomendaciones para realizar cambios en la dieta y en el estilo de vida no ayudan, el mdico le puede indicar el uso de laxantes de venta libre  para ayudarlo a defecar. Los medicamentos recetados se pueden prescribir si los medicamentos de venta libre no lo ayudan.  INSTRUCCIONES PARA EL CUIDADO EN EL HOGAR   Consuma alimentos con alto contenido de fibra, como frutas, vegetales, cereales integrales y porotos.  Limite los alimentos procesados ricos en grasas y azcar, como las papas fritas, hamburguesas, galletas, dulces y refrescos.  Puede agregar un suplemento de fibra a su dieta si no obtiene lo suficiente de los alimentos.  Beba suficiente lquido para mantener la orina clara o de color amarillo plido.  Haga ejercicio regularmente o segn las indicaciones del mdico.  Vaya al bao cuando sienta la necesidad de ir. No se aguante las ganas.  Tome solo medicamentos de venta libre o recetados, segn las indicaciones del mdico. No tome otros medicamentos para el estreimiento sin consultarlo antes con su mdico. SOLICITE ATENCIN MDICA DE INMEDIATO SI:   Observa sangre brillante en las heces.  El estreimiento dura ms de 4 das o empeora.  Siente dolor abdominal o rectal.  Las heces son delgadas como un lpiz.  Pierde peso de manera inexplicable. ASEGRESE DE QUE:   Comprende estas instrucciones.  Controlar su afeccin.  Recibir ayuda de inmediato si no mejora o si empeora.   Esta informacin no tiene como fin reemplazar el consejo del mdico. Asegrese de hacerle al mdico cualquier pregunta que tenga.   Document Released: 07/09/2007 Document Revised: 07/10/2014 Elsevier Interactive Patient Education 2016 Elsevier Inc.  

## 2015-07-01 ENCOUNTER — Telehealth: Payer: Self-pay | Admitting: *Deleted

## 2015-07-01 NOTE — Telephone Encounter (Signed)
Attempted to contact patient via [employer] id# [phone].  Left HIPAA compliant message for patient to return RN call at [phone]   Renal function has improved since discontinuation of Lasix. Continue to avoid NSAIDs. Potassium is mildly elevated; we'll monitor this and repeat at her next office visit.

## 2015-07-13 ENCOUNTER — Ambulatory Visit: Payer: Self-pay | Admitting: Dietician

## 2015-07-16 MED FILL — GABAPENTIN 100 MG CAPSULE: 100 | 30 days supply | Qty: 60 | Fill #1

## 2015-07-16 MED FILL — VALSARTAN 160 MG TABLET: 160 | 30 days supply | Qty: 30 | Fill #1

## 2015-07-20 ENCOUNTER — Telehealth: Payer: Self-pay | Admitting: *Deleted

## 2015-07-20 NOTE — Telephone Encounter (Signed)
[place] interpreter id# [phone]  Left HIPAA compliant message

## 2015-07-20 NOTE — Telephone Encounter (Signed)
-----   Message from Arnoldo Morale, MD sent at 07/01/2015  1:56 PM EST ----- Renal function has improved since discontinuation of Lasix. Continue to avoid NSAIDs. Potassium is mildly elevated; we'll monitor this and repeat at her next office visit.

## 2015-07-22 NOTE — Telephone Encounter (Signed)
HIPAA compliant letter mailed to patient.

## 2015-07-22 NOTE — Telephone Encounter (Signed)
-----   Message from Arnoldo Morale, MD sent at 07/01/2015  1:56 PM EST ----- Renal function has improved since discontinuation of Lasix. Continue to avoid NSAIDs. Potassium is mildly elevated; we'll monitor this and repeat at her next office visit.

## 2015-08-06 MED FILL — GABAPENTIN 100 MG CAPSULE: 100 | 30 days supply | Qty: 60 | Fill #2

## 2015-08-18 ENCOUNTER — Telehealth: Payer: Self-pay | Admitting: Family Medicine

## 2015-08-18 DIAGNOSIS — Z794 Long term (current) use of insulin: Principal | ICD-10-CM

## 2015-08-18 DIAGNOSIS — E0849 Diabetes mellitus due to underlying condition with other diabetic neurological complication: Secondary | ICD-10-CM

## 2015-08-18 MED FILL — VALSARTAN 160 MG TABLET: 160 | 30 days supply | Qty: 30 | Fill #2

## 2015-08-18 NOTE — Telephone Encounter (Signed)
Pt. Daughter came in stating that pt. PCP was going to change rosuvastatin (CRESTOR) 10 MG tablet to a cheaper medication. Please f/u with pt.

## 2015-08-19 NOTE — Telephone Encounter (Signed)
Routing to MD.

## 2015-08-30 ENCOUNTER — Other Ambulatory Visit: Payer: Self-pay | Admitting: Family Medicine

## 2015-08-30 DIAGNOSIS — E785 Hyperlipidemia, unspecified: Secondary | ICD-10-CM

## 2015-08-30 MED ORDER — ATORVASTATIN CALCIUM 20 MG PO TABS: 20.0000 mg | ORAL_TABLET | Freq: Every day | ORAL | Status: DC

## 2015-08-30 MED FILL — ATORVASTATIN 20 MG TABLET: 20 | 30 days supply | Qty: 30 | Fill #0

## 2015-08-31 NOTE — Telephone Encounter (Signed)
Pt. Came in requesting a med refill on Gabapentin. Please f/u with pt.

## 2015-09-01 MED ORDER — GABAPENTIN 300 MG PO CAPS: 300.0000 mg | ORAL_CAPSULE | Freq: Every day | ORAL | Status: DC

## 2015-09-01 MED FILL — GABAPENTIN 300 MG CAPSULE: 300 | 30 days supply | Qty: 30 | Fill #0

## 2015-09-01 NOTE — Telephone Encounter (Signed)
Gabapentin refill sent to pharmacy.

## 2015-09-07 ENCOUNTER — Telehealth: Payer: Self-pay | Admitting: *Deleted

## 2015-09-07 NOTE — Telephone Encounter (Signed)
PI id # [id number]  Placed call -patient aware of appointment at [place] at 8:15- she knows to arrive at [facility]

## 2015-09-13 ENCOUNTER — Encounter: Payer: Self-pay | Admitting: Family Medicine

## 2015-09-13 ENCOUNTER — Ambulatory Visit: Payer: Self-pay | Attending: Family Medicine | Admitting: Family Medicine

## 2015-09-13 VITALS — BP 163/77 | HR 68 | Temp 98.5°F | Resp 15 | Ht 59.0 in | Wt 159.4 lb

## 2015-09-13 DIAGNOSIS — N189 Chronic kidney disease, unspecified: Secondary | ICD-10-CM | POA: Insufficient documentation

## 2015-09-13 DIAGNOSIS — Z794 Long term (current) use of insulin: Secondary | ICD-10-CM

## 2015-09-13 DIAGNOSIS — E1121 Type 2 diabetes mellitus with diabetic nephropathy: Secondary | ICD-10-CM

## 2015-09-13 DIAGNOSIS — E118 Type 2 diabetes mellitus with unspecified complications: Secondary | ICD-10-CM

## 2015-09-13 DIAGNOSIS — M79671 Pain in right foot: Secondary | ICD-10-CM | POA: Insufficient documentation

## 2015-09-13 DIAGNOSIS — Z79899 Other long term (current) drug therapy: Secondary | ICD-10-CM | POA: Insufficient documentation

## 2015-09-13 DIAGNOSIS — N184 Chronic kidney disease, stage 4 (severe): Secondary | ICD-10-CM

## 2015-09-13 DIAGNOSIS — N289 Disorder of kidney and ureter, unspecified: Secondary | ICD-10-CM | POA: Insufficient documentation

## 2015-09-13 DIAGNOSIS — E1122 Type 2 diabetes mellitus with diabetic chronic kidney disease: Secondary | ICD-10-CM | POA: Insufficient documentation

## 2015-09-13 DIAGNOSIS — I129 Hypertensive chronic kidney disease with stage 1 through stage 4 chronic kidney disease, or unspecified chronic kidney disease: Secondary | ICD-10-CM | POA: Insufficient documentation

## 2015-09-13 DIAGNOSIS — R52 Pain, unspecified: Secondary | ICD-10-CM

## 2015-09-13 DIAGNOSIS — G546 Phantom limb syndrome with pain: Secondary | ICD-10-CM | POA: Insufficient documentation

## 2015-09-13 DIAGNOSIS — I10 Essential (primary) hypertension: Secondary | ICD-10-CM

## 2015-09-13 DIAGNOSIS — E785 Hyperlipidemia, unspecified: Secondary | ICD-10-CM

## 2015-09-13 DIAGNOSIS — E079 Disorder of thyroid, unspecified: Secondary | ICD-10-CM | POA: Insufficient documentation

## 2015-09-13 DIAGNOSIS — Z89422 Acquired absence of other left toe(s): Secondary | ICD-10-CM

## 2015-09-13 DIAGNOSIS — IMO0002 Reserved for concepts with insufficient information to code with codable children: Secondary | ICD-10-CM

## 2015-09-13 DIAGNOSIS — E0849 Diabetes mellitus due to underlying condition with other diabetic neurological complication: Secondary | ICD-10-CM

## 2015-09-13 LAB — COMPLETE METABOLIC PANEL WITH GFR
ALBUMIN: 3.1 g/dL — AB (ref 3.6–5.1)
ALK PHOS: 130 U/L (ref 33–130)
ALT: 17 U/L (ref 6–29)
AST: 20 U/L (ref 10–35)
BILIRUBIN TOTAL: 0.3 mg/dL (ref 0.2–1.2)
BUN: 36 mg/dL — ABNORMAL HIGH (ref 7–25)
CALCIUM: 8.6 mg/dL (ref 8.6–10.4)
CO2: 25 mmol/L (ref 20–31)
CREATININE: 2.42 mg/dL — AB (ref 0.50–0.99)
Chloride: 106 mmol/L (ref 98–110)
GFR, EST AFRICAN AMERICAN: 23 mL/min — AB (ref >=60)
GFR, Est Non African American: 20 mL/min — ABNORMAL LOW (ref >=60)
Glucose, Bld: 341 mg/dL — ABNORMAL HIGH (ref 65–99)
Potassium: 5.4 mmol/L — ABNORMAL HIGH (ref 3.5–5.3)
Sodium: 139 mmol/L (ref 135–146)
TOTAL PROTEIN: 5.7 g/dL — AB (ref 6.1–8.1)

## 2015-09-13 LAB — POCT GLYCOSYLATED HEMOGLOBIN (HGB A1C): HEMOGLOBIN A1C: 9.4

## 2015-09-13 LAB — GLUCOSE, POCT (MANUAL RESULT ENTRY): POC Glucose: 294 mg/dl — AB (ref 70–99)

## 2015-09-13 MED ORDER — ATORVASTATIN CALCIUM 20 MG PO TABS: 20.0000 mg | ORAL_TABLET | Freq: Every day | ORAL | Status: DC

## 2015-09-13 MED ORDER — VALSARTAN 160 MG PO TABS: 160.0000 mg | ORAL_TABLET | Freq: Every day | ORAL | Status: DC

## 2015-09-13 MED ORDER — INSULIN GLARGINE 100 UNIT/ML ~~LOC~~ SOLN: 25.0000 [IU] | Freq: Two times a day (BID) | SUBCUTANEOUS | Status: DC

## 2015-09-13 MED ORDER — LACTULOSE 10 GM/15ML PO SOLN: 10.0000 g | Freq: Every day | ORAL | Status: DC | PRN

## 2015-09-13 MED ORDER — GABAPENTIN 300 MG PO CAPS: 600.0000 mg | ORAL_CAPSULE | Freq: Every day | ORAL | Status: DC

## 2015-09-13 MED FILL — GABAPENTIN 300 MG CAPSULE: 300 | 30 days supply | Qty: 60 | Fill #0

## 2015-09-13 MED FILL — LANTUS 100 UNITS/ML VIAL: 100 | 30 days supply | Qty: 10 | Fill #0

## 2015-09-13 MED FILL — LACTULOSE 10 GM/15 ML SOLN: 10 | 32 days supply | Qty: 473 | Fill #0

## 2015-09-13 NOTE — Progress Notes (Signed)
Husband states he did not giver her her insulin this am She is out of her amlodipine

## 2015-09-13 NOTE — Patient Instructions (Signed)
Dolor del Building control surveyor (Phantom Limb Pain) El dolor del miembro fantasma ocurre luego de la amputacin de un brazo o una pierna. Es Conservation officer, historic buildings en una extremidad que ya no existe. Este dolor vara en cada paciente. Las distintas actividades pueden causar Conservation officer, historic buildings. Algunas personas que sufrieron la amputacin de un miembro pueden experimentar lo opuesto a Systems developer, es Engineer, water.  El problema comienza en una zona del cerebro llamada corteza sensorial. La corteza sensorial es la porcin del cerebro que procesa las sensaciones del resto del cuerpo. La hiptesis es que cuando se pierde una parte del cuerpo, la parte correspondiente en el cerebro no puede manejar la prdida y Retail banker su circuito para compensar las seales que ya no recibe de la extremidad inexistente. El mecanismo exacto del dolor del miembro fantasma no se conoce. La gravedad del dolor parece correlacionarse con problemas personales como el estrs y la actitud. Tambin parece correlacionarse con la cantidad de dolor que una persona tuvo antes de la operacin. CAUSAS   Terminales nerviosas daadas.  Tejido cicatrizal en el sitio de la amputacin. TRATAMIENTO  El dolor en el miembro fantasma puede ser intenso y debilitante. En la [employer], este dolor dura un tiempo breve. Hay cierto nmero de terapias y medicamentos que pueden dar [place]. Siga trabajando con su mdico hasta obtener [employer]. Algunos tratamientos que pueden ser de [place] son:  Hipnosis e imaginera mental. Sus tcnicas pueden dar a los pacientes la fuerza necesaria para [name] su capacidad de volver a Health and safety inspector.  Biorretroalimentacin.  Tcnicas de relajacin. Se relacionan con tcnicas de hipnosis y se Biochemist, clinical y el cuerpo Financial controller.  Acupuntura.  Masajes.  Actividad fsica.  Medicamentos antidepresivos.  Medicamentos anticonvulsivantes.  Medicamentos narcticos para Conservation officer, historic buildings. SOLICITE ATENCIN  MDICA SI: El dolor aumenta o no se [place].   Esta informacin no tiene [name] el consejo del mdico. Asegrese de hacerle al mdico cualquier pregunta que tenga.   Document Released: 02/19/2013 Elsevier Interactive Patient Education [employer].

## 2015-09-13 NOTE — Progress Notes (Signed)
Subjective:    Patient ID: Sarah Kelley, female    DOB: 11-23-1950, 65 y.o.   MRN: [id number]  HPI  65 year old Spanish speaking female seen with the aid of a language line with a history of HTN, uncontrolled Type 2DM, Hyperlipidemia, CKD , Diabetic Neuropathy, Left  foot 3rd toe Osteomyelitis s/p amputation who comes in for a follow up office visit. She has been released by her orthopedic surgeon-Dr. Sharol Given but continues to complain of pain at the sole of her left foot anterior to the site where her toe was amputated and she also has some pain in her right foot as well which is worse at night but minimal at this time.  Patient was recently seen by Nephrology and commenced on Procalcitol with a follow-up visit in 3 months. Her blood pressure is elevated today and she endorses running out of her medications.  A1c today is 9.4 and she has been taking 25 units of Lantus in the morning and 20 units at night rather than to 25 units twice daily and has been reports she has had 2 hypoglycemic episodes with fasting blood sugars of 74 and 70.  Past Medical History  Diagnosis Date  . Diabetes mellitus   . Hypertension   . Hyperlipidemia   . Renal disorder   . Thyroid disease     Past Surgical History  Procedure Laterality Date  . Amputation Left 05/21/2015    Procedure: Left Foot 3rd Ray Amputation;  Surgeon: Newt Minion, MD;  Location: [place] [facility];  Service: Orthopedics;  Laterality: Left;    Social History   Social History  . Marital Status: Married    Spouse Name: N/A  . Number of Children: N/A  . Years of Education: N/A   Occupational History  . Not on file.   Social History Main Topics  . Smoking status: Never Smoker   . Smokeless tobacco: Never Used  . Alcohol Use: No  . Drug Use: No  . Sexual Activity: Not on file   Other Topics Concern  . Not on file   Social History Narrative    No Known Allergies  Current Outpatient Prescriptions on File Prior to Visit    Medication Sig Dispense Refill  . atorvastatin (LIPITOR) 20 MG tablet Take 1 tablet (20 mg total) by mouth daily. 30 tablet 3  . gabapentin (NEURONTIN) 300 MG capsule Take 1 capsule (300 mg total) by mouth at bedtime. 30 capsule 2  . valsartan (DIOVAN) 160 MG tablet Take 1 tablet (160 mg total) by mouth daily. 30 tablet 2  . acetaminophen (TYLENOL) 325 MG tablet Take 650 mg by mouth every 6 (six) hours as needed for mild pain. Reported on 09/13/2015    . amLODipine (NORVASC) 5 MG tablet Take 1.5 tablets (7.5 mg total) by mouth daily. (Patient not taking: Reported on 09/13/2015) 45 tablet 2  . insulin glargine (LANTUS) 100 UNIT/ML injection Inject 0.25 mLs (25 Units total) into the skin 2 (two) times daily. (Patient not taking: Reported on 09/13/2015) 10 mL 1  . iron polysaccharides (NIFEREX) 150 MG capsule Take 1 capsule (150 mg total) by mouth 2 (two) times daily. (Patient not taking: Reported on 06/14/2015) 60 capsule 0  . lactulose (CHRONULAC) 10 GM/15ML solution Take 15 mLs (10 g total) by mouth daily as needed for mild constipation. (Patient not taking: Reported on 09/13/2015) 473 mL 0  . paricalcitol (ZEMPLAR) 1 MCG capsule Take 1 mcg by mouth daily. Reported on 09/13/2015  No current facility-administered medications on file prior to visit.     Review of Systems Constitutional: Negative for fever, chills, diaphoresis, activity change, appetite change and positive for fatigue. HENT: Negative for ear pain, nosebleeds, congestion, facial swelling, rhinorrhea, neck pain, neck stiffness and ear discharge.  Eyes: Negative for pain, discharge, redness, itching and visual disturbance. Respiratory: Negative for cough, choking, chest tightness, shortness of breath, wheezing and stridor.  Cardiovascular: Negative for chest pain, palpitations and leg swelling. Gastrointestinal: See history of present illness Genitourinary: Negative for dysuria, urgency, frequency, hematuria, flank pain, decreased  urine volume, difficulty urinating and dyspareunia.  Musculoskeletal: pain in legs and feet Neurological: Negative for dizziness, tremors, seizures, syncope, facial asymmetry, speech difficulty, weakness, light-headedness, numbness and headaches.  Hematological: Negative for adenopathy. Does not bruise/bleed easily. Psychiatric/Behavioral: Negative for hallucinations, behavioral problems, confusion, dysphoric mood, decreased concentration and agitation.         Objective: Filed Vitals:   09/13/15 1043  BP: 163/77  Pulse: 68  Temp: 98.5 F (36.9 C)  Resp: 15  Height: 4\' 11"  (1.499 m)  Weight: 159 lb 6.4 oz (72.303 kg)  SpO2: 98%      Physical Exam  Constitutional: Patient appears well-developed and well-nourished. No distress. CVS: RRR, S1/S2 +, no murmurs, no gallops, no carotid bruit.  Pulmonary: Effort and breath sounds normal, no stridor, rhonchi, wheezes, rales.  Abdominal: Soft. BS +,  no distension, tenderness, rebound or guarding.  Musculoskeletal: Site of amputation of left third toe healed Lymphadenopathy: No lymphadenopathy noted, cervical, inguinal or axillary Neuro: Alert. Normal reflexes, muscle tone coordination. No cranial nerve deficit. Skin: Skin is warm and dry. No rash noted. Not diaphoretic. No erythema. No pallor. Psychiatric: Normal mood and affect. Behavior, judgment, thought content normal.      Assessment & Plan:  Status post toe amputation: Released by Ortho- Dr Sharol Given  she does seem to experience some neuropathic pain versus phantom pain Increase her Neurontin dose to 600 mg and we will maintain the bedtime dose as the family states she is  somnolent during the day  Type 2 diabetes mellitus: Uncontrolled with A1c of 9.4 Blood sugar log reveals control which is incongruent with an a1c of 9.4 Continue Lantus-25 units twice daily and to notify the clinic in the event of any hypoglycemia.  Acute on chronic kidney disease: Seen by [facility]  recently Was previously on Lasix which i discontinued at the last visit as this may have contributed to her worsening renal function.  .  Hypertension: Uncontrolled due to running out of Amlodipine  continue antihypertensive, low-sodium, DASH diet.   Hyperlipidemia: Continue atorvastatin.   Constipation: Improved on lactulose

## 2015-09-16 ENCOUNTER — Telehealth: Payer: Self-pay | Admitting: *Deleted

## 2015-09-16 MED FILL — VALSARTAN 160 MG TABLET: 160 | 30 days supply | Qty: 30 | Fill #0

## 2015-09-16 NOTE — Telephone Encounter (Signed)
[place] with [employer]  Left HIPPA complaint message that RN would try to call back.

## 2015-09-16 NOTE — Telephone Encounter (Signed)
-----   Message from Arnoldo Morale, MD sent at 09/16/2015  1:01 PM EDT ----- Labs reveal hyperkalemia; please schedule her for repeat basic metabolic panel in 2 weeks.

## 2015-09-20 NOTE — Telephone Encounter (Signed)
Left message for patient that RN would return call.

## 2015-09-27 ENCOUNTER — Other Ambulatory Visit: Payer: Self-pay | Admitting: Nephrology

## 2015-09-27 ENCOUNTER — Telehealth: Payer: Self-pay | Admitting: *Deleted

## 2015-09-27 DIAGNOSIS — N184 Chronic kidney disease, stage 4 (severe): Secondary | ICD-10-CM

## 2015-09-27 NOTE — Telephone Encounter (Signed)
[id number] [employer]  Left message

## 2015-10-04 NOTE — Telephone Encounter (Signed)
Letter sent to patient regarding her need to call the clinic for her results.

## 2015-10-06 MED FILL — ATORVASTATIN 20 MG TABLET: 20 | 30 days supply | Qty: 30 | Fill #1

## 2015-10-08 ENCOUNTER — Ambulatory Visit
Admission: RE | Admit: 2015-10-08 | Discharge: 2015-10-08 | Disposition: A | Discharge status: Home / Self Care | Payer: No Typology Code available for payment source | Source: Ambulatory Visit | Attending: Nephrology | Admitting: Nephrology

## 2015-10-08 DIAGNOSIS — N184 Chronic kidney disease, stage 4 (severe): Secondary | ICD-10-CM

## 2015-10-22 MED FILL — GABAPENTIN 300 MG CAPSULE: 300 | 30 days supply | Qty: 60 | Fill #1

## 2015-10-22 MED FILL — VALSARTAN 160 MG TABLET: 160 | 30 days supply | Qty: 30 | Fill #1

## 2015-11-12 MED FILL — ATORVASTATIN 20 MG TABLET: 20 | 30 days supply | Qty: 30 | Fill #2

## 2015-11-23 ENCOUNTER — Telehealth: Payer: Self-pay | Admitting: Family Medicine

## 2015-11-23 DIAGNOSIS — E118 Type 2 diabetes mellitus with unspecified complications: Secondary | ICD-10-CM

## 2015-11-23 MED ORDER — INSULIN GLARGINE 100 UNIT/ML ~~LOC~~ SOLN: 25.0000 [IU] | Freq: Two times a day (BID) | SUBCUTANEOUS | Status: DC

## 2015-11-23 MED FILL — GABAPENTIN 300 MG CAPSULE: 300 | 30 days supply | Qty: 60 | Fill #2

## 2015-11-23 NOTE — Telephone Encounter (Signed)
Lantus refilled, patient will be due for a visit next month.

## 2015-11-23 NOTE — Telephone Encounter (Signed)
Patient needs insulin. Please follow up.

## 2015-12-02 MED FILL — VALSARTAN 160 MG TABLET: 160 | 30 days supply | Qty: 30 | Fill #2

## 2015-12-15 MED FILL — !LANTUS SOLOSTAR 100UNITS/M: 100 | 60 days supply | Qty: 15 | Fill #0

## 2015-12-15 MED FILL — ?ATORVASTATIN 20 MG TABLET: 20 | 30 days supply | Qty: 30 | Fill #3

## 2015-12-17 ENCOUNTER — Other Ambulatory Visit: Payer: Self-pay

## 2015-12-17 DIAGNOSIS — N184 Chronic kidney disease, stage 4 (severe): Secondary | ICD-10-CM

## 2015-12-17 DIAGNOSIS — Z01810 Encounter for preprocedural cardiovascular examination: Secondary | ICD-10-CM

## 2015-12-31 ENCOUNTER — Telehealth: Payer: Self-pay | Admitting: Family Medicine

## 2015-12-31 DIAGNOSIS — Z794 Long term (current) use of insulin: Principal | ICD-10-CM

## 2015-12-31 DIAGNOSIS — E0849 Diabetes mellitus due to underlying condition with other diabetic neurological complication: Secondary | ICD-10-CM

## 2015-12-31 MED ORDER — GABAPENTIN 300 MG PO CAPS: 600.0000 mg | ORAL_CAPSULE | Freq: Every day | ORAL | Status: DC

## 2015-12-31 MED FILL — VALSARTAN 160 MG TABLET: 160 | 30 days supply | Qty: 30 | Fill #3

## 2015-12-31 MED FILL — GABAPENTIN 300 MG CAPSULE: 300 | 30 days supply | Qty: 60 | Fill #0

## 2015-12-31 NOTE — Telephone Encounter (Signed)
Gabapentin refilled

## 2015-12-31 NOTE — Telephone Encounter (Signed)
Patient needs gabapentin

## 2016-01-05 ENCOUNTER — Telehealth: Payer: Self-pay | Admitting: Family Medicine

## 2016-01-05 NOTE — Telephone Encounter (Signed)
Pt dropped off medication assistance application Pt states paperwork just needs to be sent to the company after filled out Thank you

## 2016-01-05 NOTE — Telephone Encounter (Signed)
Medication assistance paperwork needs to go through the pharmacy.

## 2016-01-13 ENCOUNTER — Encounter: Payer: Self-pay | Admitting: Family Medicine

## 2016-01-13 ENCOUNTER — Other Ambulatory Visit: Payer: Self-pay

## 2016-01-13 ENCOUNTER — Ambulatory Visit: Payer: Self-pay | Attending: Family Medicine | Admitting: Family Medicine

## 2016-01-13 VITALS — BP 166/81 | HR 66 | Temp 98.0°F | Ht 59.0 in | Wt 159.0 lb

## 2016-01-13 DIAGNOSIS — J302 Other seasonal allergic rhinitis: Secondary | ICD-10-CM | POA: Insufficient documentation

## 2016-01-13 DIAGNOSIS — E118 Type 2 diabetes mellitus with unspecified complications: Secondary | ICD-10-CM

## 2016-01-13 DIAGNOSIS — R42 Dizziness and giddiness: Secondary | ICD-10-CM | POA: Insufficient documentation

## 2016-01-13 DIAGNOSIS — E785 Hyperlipidemia, unspecified: Secondary | ICD-10-CM | POA: Insufficient documentation

## 2016-01-13 DIAGNOSIS — R7309 Other abnormal glucose: Secondary | ICD-10-CM

## 2016-01-13 DIAGNOSIS — Z794 Long term (current) use of insulin: Secondary | ICD-10-CM | POA: Insufficient documentation

## 2016-01-13 DIAGNOSIS — K2990 Gastroduodenitis, unspecified, without bleeding: Secondary | ICD-10-CM | POA: Insufficient documentation

## 2016-01-13 DIAGNOSIS — Z79899 Other long term (current) drug therapy: Secondary | ICD-10-CM | POA: Insufficient documentation

## 2016-01-13 DIAGNOSIS — E1169 Type 2 diabetes mellitus with other specified complication: Secondary | ICD-10-CM | POA: Insufficient documentation

## 2016-01-13 DIAGNOSIS — I10 Essential (primary) hypertension: Secondary | ICD-10-CM

## 2016-01-13 DIAGNOSIS — E0849 Diabetes mellitus due to underlying condition with other diabetic neurological complication: Secondary | ICD-10-CM

## 2016-01-13 DIAGNOSIS — I129 Hypertensive chronic kidney disease with stage 1 through stage 4 chronic kidney disease, or unspecified chronic kidney disease: Secondary | ICD-10-CM | POA: Insufficient documentation

## 2016-01-13 DIAGNOSIS — K2970 Gastritis, unspecified, without bleeding: Secondary | ICD-10-CM

## 2016-01-13 DIAGNOSIS — R739 Hyperglycemia, unspecified: Secondary | ICD-10-CM

## 2016-01-13 LAB — LIPID PANEL
CHOL/HDL RATIO: 4.9 ratio (ref <=5.0)
CHOLESTEROL: 246 mg/dL — AB (ref 125–200)
HDL: 50 mg/dL (ref >=46)
LDL CALC: 130 mg/dL — AB (ref <130)
Triglycerides: 329 mg/dL — ABNORMAL HIGH (ref <150)
VLDL: 66 mg/dL — AB (ref <30)

## 2016-01-13 LAB — COMPLETE METABOLIC PANEL WITH GFR
ALT: 19 U/L (ref 6–29)
AST: 22 U/L (ref 10–35)
Albumin: 3.1 g/dL — ABNORMAL LOW (ref 3.6–5.1)
Alkaline Phosphatase: 153 U/L — ABNORMAL HIGH (ref 33–130)
BUN: 49 mg/dL — AB (ref 7–25)
CALCIUM: 8.1 mg/dL — AB (ref 8.6–10.4)
CHLORIDE: 106 mmol/L (ref 98–110)
CO2: 25 mmol/L (ref 20–31)
CREATININE: 3.97 mg/dL — AB (ref 0.50–0.99)
GFR, Est African American: 13 mL/min — ABNORMAL LOW (ref >=60)
GFR, Est Non African American: 11 mL/min — ABNORMAL LOW (ref >=60)
GLUCOSE: 212 mg/dL — AB (ref 65–99)
POTASSIUM: 4.7 mmol/L (ref 3.5–5.3)
SODIUM: 139 mmol/L (ref 135–146)
Total Bilirubin: 0.3 mg/dL (ref 0.2–1.2)
Total Protein: 5.8 g/dL — ABNORMAL LOW (ref 6.1–8.1)

## 2016-01-13 LAB — GLUCOSE, POCT (MANUAL RESULT ENTRY): POC GLUCOSE: 221 mg/dL — AB (ref 70–99)

## 2016-01-13 LAB — POCT GLYCOSYLATED HEMOGLOBIN (HGB A1C): Hemoglobin A1C: 10.9

## 2016-01-13 MED ORDER — AMLODIPINE BESYLATE 10 MG PO TABS: 10.0000 mg | ORAL_TABLET | Freq: Every day | ORAL | Status: DC

## 2016-01-13 MED ORDER — INSULIN GLARGINE 100 UNIT/ML ~~LOC~~ SOLN: 30.0000 [IU] | Freq: Two times a day (BID) | SUBCUTANEOUS | Status: DC

## 2016-01-13 MED ORDER — CETIRIZINE HCL 10 MG PO TABS: 10.0000 mg | ORAL_TABLET | Freq: Every day | ORAL | Status: DC

## 2016-01-13 MED ORDER — VALSARTAN 160 MG PO TABS: 160.0000 mg | ORAL_TABLET | Freq: Every day | ORAL | Status: DC

## 2016-01-13 MED ORDER — PANTOPRAZOLE SODIUM 40 MG PO TBEC: 40.0000 mg | DELAYED_RELEASE_TABLET | Freq: Every day | ORAL | Status: DC

## 2016-01-13 MED ORDER — ATORVASTATIN CALCIUM 20 MG PO TABS: 20.0000 mg | ORAL_TABLET | Freq: Every day | ORAL | Status: DC

## 2016-01-13 MED ORDER — GABAPENTIN 300 MG PO CAPS: 600.0000 mg | ORAL_CAPSULE | Freq: Every day | ORAL | Status: DC

## 2016-01-13 MED ORDER — MECLIZINE HCL 25 MG PO TABS: 25.0000 mg | ORAL_TABLET | Freq: Three times a day (TID) | ORAL | Status: DC | PRN

## 2016-01-13 MED FILL — ATORVASTATIN 20 MG TABLET: 20 | 30 days supply | Qty: 30 | Fill #0

## 2016-01-13 MED FILL — ?MECLIZINE 25 MG TABLET: 25 | 10 days supply | Qty: 30 | Fill #0

## 2016-01-13 MED FILL — ?PANTOPRAZOLE SOD DR 40MG: 40 MG | 30 days supply | Qty: 30 | Fill #0

## 2016-01-13 MED FILL — ?CETIRIZINE HCL 10 MG TABLE: 10 | 20 days supply | Qty: 30 | Fill #0

## 2016-01-13 NOTE — Patient Instructions (Signed)
La diabetes mellitus y los alimentos (Diabetes Mellitus and Food) Es importante que controle su nivel de azcar en la sangre (glucosa). El nivel de glucosa en sangre depende en gran medida de lo que usted come. Comer alimentos saludables en las cantidades [place] a lo largo del Training and development officer, aproximadamente a la misma hora [employer], lo ayudar a Chief Technology Officer su nivel de Multimedia programmer. Tambin puede ayudarlo a retrasar o Patent attorney de la diabetes mellitus. Comer de [employer] saludable incluso puede ayudarlo a Chartered loss adjuster de presin arterial y a Science writer o Theatre manager un peso saludable.  Entre las recomendaciones generales para alimentarse y Audiological scientist los alimentos de forma saludable, se incluyen las siguientes:  Respetar las comidas principales y comer colaciones con regularidad. Evitar pasar largos perodos sin comer con el fin de perder peso.  Seguir una dieta que consista principalmente en alimentos de origen vegetal, como frutas, vegetales, frutos secos, legumbres y cereales integrales.  Utilizar mtodos de coccin a baja temperatura, como hornear, en lugar de mtodos de coccin a alta temperatura, como frer en abundante aceite. Trabaje con el nutricionista para aprender a Financial planner nutricional de las etiquetas de los alimentos. CMO PUEDEN AFECTARME LOS ALIMENTOS? Carbohidratos Los carbohidratos afectan el nivel de glucosa en sangre ms que cualquier otro tipo de alimento. El nutricionista lo ayudar a Teacher, adult education cuntos carbohidratos puede consumir en cada comida y ensearle a contarlos. El recuento de carbohidratos es importante para mantener la glucosa en sangre en un nivel saludable, en especial si utiliza insulina o toma determinados medicamentos para la diabetes mellitus. Alcohol El alcohol puede provocar disminuciones sbitas de la glucosa en sangre (hipoglucemia), en especial si utiliza insulina o toma determinados medicamentos para la diabetes mellitus. La  hipoglucemia es una afeccin que puede poner en peligro la vida. Los sntomas de la hipoglucemia (somnolencia, mareos y Data processing manager) son similares a los sntomas de haber consumido mucho alcohol.  Si el mdico lo autoriza a beber alcohol, hgalo con moderacin y siga estas pautas:  Las mujeres no deben beber ms de un trago por da, y los hombres no deben beber ms de dos tragos por Training and development officer. Un trago es igual a:  12 onzas (355 ml) de cerveza  5 onzas de vino (150 ml) de vino  1,5onzas (23m) de bebidas espirituosas  No beba con el estmago vaco.  Mantngase hidratado. Beba agua, gaseosas dietticas o t helado sin azcar.  Las gaseosas comunes, los jugos y otros refrescos podran contener muchos carbohidratos y se dCivil Service fast streamer QU ALIMENTOS NO SE RECOMIENDAN? Cuando haga las elecciones de alimentos, es importante que recuerde que todos los alimentos son distintos. Algunos tienen menos nutrientes que otros por porcin, aunque podran tener la misma cantidad de caloras o carbohidratos. Es difcil darle al cuerpo lo que necesita cuando consume alimentos con menos nutrientes. Estos son algunos ejemplos de alimentos que debera evitar ya que contienen muchas caloras y carbohidratos, pero pocos nutrientes:  GPhysicist, medicaltrans (la mayora de los alimentos procesados incluyen grasas trans en la etiqueta de Informacin nutricional).  Gaseosas comunes.  Jugos.  Caramelos.  Dulces, como tortas, pasteles, rosquillas y [place]  Comidas fritas. QU ALIMENTOS PUEDO COMER? Consuma alimentos ricos en nutrientes, que nutrirn el cuerpo y lo mantendrn saludable. Los alimentos que debe comer tambin dependern de varios factores, como:  Las caloras que necesita.  Los medicamentos que toma.  Su peso.  El nivel de glucosa en s[employer]ist College  El nArrow Rockde presin arterial.  El nivel de colesterol.  Debe consumir una amplia variedad de alimentos, por ejemplo:  Protenas.  Cortes de [employer].  Protenas con bajo contenido de grasas saturadas, como pescado, clara de huevo y frijoles. Evite las carnes procesadas.  Frutas y vegetales.  Frutas y Photographer que pueden ayudar a Chief Technology Officer los niveles sanguneos de [place], como [place], mangos y batatas.  Productos lcteos.  Elija productos lcteos sin grasa o con bajo contenido de [place], como [place], yogur y [place].  Cereales, panes, pastas y arroz.  Elija cereales integrales, como panes multicereales, avena en grano y arroz integral. Estos alimentos pueden ayudar a controlar la presin arterial.  Daphene Jaeger.  Alimentos que contengan grasas saludables, como frutos secos, Musician, aceite de [place], aceite de canola y pescado. TODOS LOS QUE PADECEN DIABETES MELLITUS TIENEN EL [place] PLAN DE [place]? Dado que todas las personas que padecen diabetes mellitus son distintas, no hay un solo plan de comidas que funcione para todos. Es muy importante que se rena con un nutricionista que lo ayudar a crear un plan de comidas adecuado para usted.   Esta informacin no tiene [name] el consejo del mdico. Asegrese de hacerle al mdico cualquier pregunta que tenga.   Document Released: 09/26/2007 Document Revised: 07/10/2014 Elsevier Interactive Patient Education [employer].

## 2016-01-13 NOTE — Progress Notes (Signed)
Subjective:  Patient ID: Sarah Kelley, female    DOB: 06-24-1951  Age: 65 y.o. 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 Her chronic kidney disease is currently managed by [facility].  Her blood pressure is elevated and she endorses not taking his antihypertensives because she has run out and will need refill on all other medications. A1c is elevated at 10.9 which has trended up from 9.4 three months ago despite compliance with Lantus 25 units twice daily.  She complains of dizziness worse when she walks and changes position; also has some headache and nostrils are dry, complains of sore throat for the last 2 weeks. Denies nausea or vomiting or constipation. Endorses abdominal pain which she describes as pins and needles in her stomach and this is occasional.    Outpatient Prescriptions Prior to Visit  Medication Sig Dispense Refill  . acetaminophen (TYLENOL) 325 MG tablet Take 650 mg by mouth every 6 (six) hours as needed for mild pain. Reported on 09/13/2015    . atorvastatin (LIPITOR) 20 MG tablet Take 1 tablet (20 mg total) by mouth daily. 30 tablet 3  . gabapentin (NEURONTIN) 300 MG capsule Take 2 capsules (600 mg total) by mouth at bedtime. 60 capsule 2  . insulin glargine (LANTUS) 100 UNIT/ML injection Inject 0.25 mLs (25 Units total) into the skin 2 (two) times daily. 10 mL 2  . valsartan (DIOVAN) 160 MG tablet Take 1 tablet (160 mg total) by mouth daily. 30 tablet 3  . iron polysaccharides (NIFEREX) 150 MG capsule Take 1 capsule (150 mg total) by mouth 2 (two) times daily. (Patient not taking: Reported on 06/14/2015) 60 capsule 0  . lactulose  (CHRONULAC) 10 GM/15ML solution Take 15 mLs (10 g total) by mouth daily as needed for mild constipation. 473 mL 0  . paricalcitol (ZEMPLAR) 1 MCG capsule Take 1 mcg by mouth daily. Reported on 09/13/2015    . amLODipine (NORVASC) 5 MG tablet Take 1.5 tablets (7.5 mg total) by mouth daily. (Patient not taking: Reported on 01/13/2016) 45 tablet 2   No facility-administered medications prior to visit.    ROS Review of Systems  Constitutional: Negative for activity change, appetite change and fatigue.  HENT: Negative for congestion, sinus pressure and sore throat.   Eyes: Negative for visual disturbance.  Respiratory: Negative for cough, chest tightness, shortness of breath and wheezing.   Cardiovascular: Negative for chest pain and palpitations.  Gastrointestinal: Positive for abdominal pain. Negative for constipation and abdominal distention.  Endocrine: Negative for polydipsia.  Genitourinary: Negative for dysuria and frequency.  Musculoskeletal: Negative for back pain and arthralgias.  Skin: Negative for rash.  Neurological: Positive for light-headedness and headaches. Negative for tremors and numbness.  Hematological: Does not bruise/bleed easily.  Psychiatric/Behavioral: Negative for behavioral problems and agitation.    Objective:  BP 166/81 mmHg  Pulse 66  Temp(Src) 98 F (36.7 C) (Oral)  Ht 4\' 11"  (1.499 m)  Wt 159 lb (72.122 kg)  BMI 32.10 kg/m2  SpO2 99%  BP/Weight 01/13/2016 09/13/2015 123456  Systolic BP [id number]  Diastolic BP 81 77 70  Wt. (Lbs) 159 159.4 160  BMI 32.1 32.18 31.79  Physical Exam Constitutional: Patient appears well-developed and well-nourished. No distress. CVS: RRR, S1/S2 +, no murmurs, no gallops, no carotid bruit.  Pulmonary: Effort and breath sounds normal, no stridor, rhonchi, wheezes, rales.  Abdominal: Soft. BS +,  no distension, tenderness, rebound or guarding.  Musculoskeletal: Site of amputation of left third toe  healed Lymphadenopathy: No lymphadenopathy noted, cervical, inguinal or axillary Neuro: Alert. Normal reflexes, muscle tone coordination. No cranial nerve deficit. Skin: Skin is warm and dry. No rash noted. Not diaphoretic. No erythema. No pallor. Psychiatric: Normal mood and affect. Behavior, judgment, thought content normal.   Lab Results  Component Value Date   HGBA1C 10.9 01/13/2016    CMP Latest Ref Rng 09/13/2015 06/30/2015 06/01/2015  Glucose 65 - 99 mg/dL 341(H) 240(H) 189(H)  BUN 7 - 25 mg/dL 36(H) 29(H) 71(H)  Creatinine 0.50 - 0.99 mg/dL 2.42(H) 2.26(H) 3.15(H)  Sodium 135 - 146 mmol/L 139 139 137  Potassium 3.5 - 5.3 mmol/L 5.4(H) 5.4(H) 4.6  Chloride 98 - 110 mmol/L 106 107 102  CO2 20 - 31 mmol/L 25 22 24   Calcium 8.6 - 10.4 mg/dL 8.6 8.7 9.0  Total Protein 6.1 - 8.1 g/dL 5.7(L) - 6.8  Total Bilirubin 0.2 - 1.2 mg/dL 0.3 - 0.4  Alkaline Phos 33 - 130 U/L 130 - 112  AST 10 - 35 U/L 20 - 22  ALT 6 - 29 U/L 17 - 14     Lipid Panel     Component Value Date/Time   CHOL 134 05/18/2015 0150   TRIG 160* 05/18/2015 0150   HDL 38* 05/18/2015 0150   CHOLHDL 3.5 05/18/2015 0150   VLDL 32 05/18/2015 0150   LDLCALC 64 05/18/2015 0150     Assessment & Plan:   1. Essential hypertension Uncontrolled due to running out of antihypertensives Low sodium ADA diet - amLODipine (NORVASC) 10 MG tablet; Take 1 tablet (10 mg total) by mouth daily.  Dispense: 30 tablet; Refill: 3 - valsartan (DIOVAN) 160 MG tablet; Take 1 tablet (160 mg total) by mouth daily.  Dispense: 30 tablet; Refill: 3 - COMPLETE METABOLIC PANEL WITH GFR  2. 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  Dispense: 60 capsule; Refill: 3  4. HLD (hyperlipidemia) Controlled - atorvastatin (LIPITOR) 20 MG tablet; Take 1 tablet (20 mg total) by mouth daily.  Dispense: 30 tablet; Refill: 3 - Lipid panel  5. Type 2 diabetes mellitus with complication, with long-term current use of insulin (HCC) - Microalbumin / creatinine urine ratio - insulin glargine (LANTUS) 100 UNIT/ML injection; Inject 0.3 mLs (30 Units total) into the skin 2 (two) times daily.  Dispense: 10 mL; Refill: 3  6. Gastritis and gastroduodenitis Will treat presumptively for gastritis given abdominal pain - pantoprazole (PROTONIX) 40 MG tablet; Take 1 tablet (40 mg total) by mouth daily.  Dispense: 30 tablet; Refill: 3  7. Vertigo - meclizine (ANTIVERT) 25 MG tablet; Take 1 tablet (25 mg total) by mouth 3 (three) times daily as needed.  Dispense: 30 tablet; Refill: 3  8. Seasonal allergies Placed on Zyrtec   Meds ordered this encounter  Medications  .  amLODipine (NORVASC) 10 MG tablet    Sig: Take 1 tablet (10 mg total) by mouth daily.    Dispense:  30 tablet    Refill:  3    Discontinue previous dose  . valsartan (DIOVAN) 160 MG tablet    Sig: Take 1 tablet (160 mg total) by mouth daily.    Dispense:  30 tablet    Refill:  3    Discontinue Benicar  . gabapentin (NEURONTIN) 300 MG capsule    Sig: Take 2 capsules (600 mg total) by mouth at bedtime.    Dispense:  60 capsule    Refill:  3  . atorvastatin (LIPITOR) 20 MG tablet    Sig: Take 1 tablet (20 mg total) by mouth daily.    Dispense:  30 tablet    Refill:  3  . insulin glargine (LANTUS) 100 UNIT/ML injection    Sig: Inject 0.3 mLs (30 Units total) into the skin 2 (two) times daily.    Dispense:  10 mL    Refill:  3  . cetirizine (ZYRTEC) 10 MG tablet    Sig: Take 1 tablet (10 mg total) by mouth daily.    Dispense:  30 tablet    Refill:  3  .  pantoprazole (PROTONIX) 40 MG tablet    Sig: Take 1 tablet (40 mg total) by mouth daily.    Dispense:  30 tablet    Refill:  3  . meclizine (ANTIVERT) 25 MG tablet    Sig: Take 1 tablet (25 mg total) by mouth 3 (three) times daily as needed.    Dispense:  30 tablet    Refill:  3    Follow-up: Return in about 3 months (around 04/14/2016) for Follow-up on diabetes mellitus.   Arnoldo Morale MD

## 2016-01-13 NOTE — Telephone Encounter (Signed)
Patient requested 90 day supply of insulin glargine (LANTUS) 100 UNIT/ML injection sent to [facility] instead of [facility]. Updated rx.

## 2016-01-14 LAB — MICROALBUMIN / CREATININE URINE RATIO
CREATININE, URINE: 95 mg/dL (ref 20–320)
MICROALB UR: 304.1 mg/dL
MICROALB/CREAT RATIO: 3201 ug/mg{creat} — AB (ref <30)

## 2016-01-17 ENCOUNTER — Telehealth: Payer: Self-pay

## 2016-01-17 ENCOUNTER — Other Ambulatory Visit: Payer: Self-pay | Admitting: Family Medicine

## 2016-01-17 DIAGNOSIS — E785 Hyperlipidemia, unspecified: Secondary | ICD-10-CM

## 2016-01-17 MED ORDER — ATORVASTATIN CALCIUM 40 MG PO TABS: 40.0000 mg | ORAL_TABLET | Freq: Every day | ORAL | Status: DC

## 2016-01-17 NOTE — Telephone Encounter (Signed)
Telephone call to [facility] interpreters assistance received from interpreter "Delfino Lovett, ID: [phone]".  Left voicemail requesting return call.  Attempted to advise:  Cholesterol is elevated and so I have increased the dose of atorvastatin from 20 mg to 40 mg; kidney function also shows a sharp decline and she will need to keep appointment with nephrology.

## 2016-01-17 NOTE — Telephone Encounter (Signed)
-----   Message from Arnoldo Morale, MD sent at 01/17/2016  1:31 PM EDT ----- Cholesterol is elevated and so I have increased the dose of atorvastatin from 20 mg to 40 mg; kidney function also shows a sharp decline and she will need to keep appointment with nephrology.

## 2016-01-20 ENCOUNTER — Encounter: Payer: Self-pay | Admitting: Surgery

## 2016-01-24 ENCOUNTER — Inpatient Hospital Stay (HOSPITAL_COMMUNITY): Admission: RE | Admit: 2016-01-24 | Payer: Self-pay | Source: Ambulatory Visit

## 2016-01-24 ENCOUNTER — Ambulatory Visit: Payer: Self-pay | Admitting: Surgery

## 2016-02-03 MED FILL — GABAPENTIN 300 MG CAPSULE: 300 | 30 days supply | Qty: 60 | Fill #1

## 2016-02-04 ENCOUNTER — Telehealth: Payer: Self-pay | Admitting: Family Medicine

## 2016-02-04 NOTE — Telephone Encounter (Signed)
Pt's son came in to the office requesting refill for mother's rx valsartan (DIOVAN) 160 MG tablet.  Please f/u  Thank you, Johanna B.

## 2016-02-08 NOTE — Telephone Encounter (Signed)
Refill was done at last office visit on 01/13/16

## 2016-02-09 NOTE — Telephone Encounter (Signed)
Writer called and spoke with patient's husband through [employer] and asked that he inform patient that she has refills to fill her requested medication.

## 2016-02-15 ENCOUNTER — Encounter (HOSPITAL_COMMUNITY): Payer: Self-pay

## 2016-02-15 ENCOUNTER — Other Ambulatory Visit (HOSPITAL_COMMUNITY): Payer: Self-pay

## 2016-02-15 MED FILL — VALSARTAN 160 MG TABLET: 160 | 30 days supply | Qty: 30 | Fill #0

## 2016-02-15 MED FILL — ?PANTOPRAZOLE SOD DR 40MG: 40 MG | 30 days supply | Qty: 30 | Fill #1

## 2016-02-15 MED FILL — ?ATORVASTATIN 20 MG TABLET: 20 | 30 days supply | Qty: 30 | Fill #1

## 2016-02-15 MED FILL — ?CETIRIZINE HCL 10 MG TABLE: 10 | 20 days supply | Qty: 30 | Fill #1

## 2016-02-21 ENCOUNTER — Ambulatory Visit: Payer: Self-pay | Admitting: Surgery

## 2016-03-07 MED FILL — GABAPENTIN 300 MG CAPSULE: 300 | 30 days supply | Qty: 60 | Fill #2

## 2016-03-09 ENCOUNTER — Encounter: Payer: Self-pay | Admitting: Surgery

## 2016-03-09 ENCOUNTER — Inpatient Hospital Stay (HOSPITAL_COMMUNITY)
Admission: RE | Admit: 2016-03-09 | Payer: Self-pay | Source: Ambulatory Visit | Attending: Vascular Surgery | Admitting: Vascular Surgery

## 2016-03-10 ENCOUNTER — Ambulatory Visit: Payer: Self-pay | Attending: Family Medicine | Admitting: Family Medicine

## 2016-03-10 ENCOUNTER — Encounter (HOSPITAL_COMMUNITY): Payer: Self-pay

## 2016-03-10 ENCOUNTER — Encounter: Payer: Self-pay | Admitting: Family Medicine

## 2016-03-10 ENCOUNTER — Ambulatory Visit (HOSPITAL_COMMUNITY)
Admission: RE | Admit: 2016-03-10 | Payer: Self-pay | Source: Ambulatory Visit | Attending: Vascular Surgery | Admitting: Vascular Surgery

## 2016-03-10 VITALS — BP 128/75 | HR 71 | Temp 98.4°F | Ht 59.5 in | Wt 162.6 lb

## 2016-03-10 DIAGNOSIS — N390 Urinary tract infection, site not specified: Secondary | ICD-10-CM | POA: Insufficient documentation

## 2016-03-10 DIAGNOSIS — E785 Hyperlipidemia, unspecified: Secondary | ICD-10-CM | POA: Insufficient documentation

## 2016-03-10 DIAGNOSIS — E1140 Type 2 diabetes mellitus with diabetic neuropathy, unspecified: Secondary | ICD-10-CM | POA: Insufficient documentation

## 2016-03-10 DIAGNOSIS — E1122 Type 2 diabetes mellitus with diabetic chronic kidney disease: Secondary | ICD-10-CM | POA: Insufficient documentation

## 2016-03-10 DIAGNOSIS — E1149 Type 2 diabetes mellitus with other diabetic neurological complication: Secondary | ICD-10-CM

## 2016-03-10 DIAGNOSIS — I129 Hypertensive chronic kidney disease with stage 1 through stage 4 chronic kidney disease, or unspecified chronic kidney disease: Secondary | ICD-10-CM | POA: Insufficient documentation

## 2016-03-10 DIAGNOSIS — Z8739 Personal history of other diseases of the musculoskeletal system and connective tissue: Secondary | ICD-10-CM | POA: Insufficient documentation

## 2016-03-10 DIAGNOSIS — N183 Chronic kidney disease, stage 3 (moderate): Secondary | ICD-10-CM | POA: Insufficient documentation

## 2016-03-10 DIAGNOSIS — E079 Disorder of thyroid, unspecified: Secondary | ICD-10-CM | POA: Insufficient documentation

## 2016-03-10 DIAGNOSIS — Z794 Long term (current) use of insulin: Secondary | ICD-10-CM | POA: Insufficient documentation

## 2016-03-10 LAB — POCT URINALYSIS DIPSTICK
BILIRUBIN UA: NEGATIVE
Glucose, UA: 500
KETONES UA: NEGATIVE
NITRITE UA: POSITIVE
PH UA: 6.5
Spec Grav, UA: 1.02
Urobilinogen, UA: 0.2

## 2016-03-10 LAB — GLUCOSE, POCT (MANUAL RESULT ENTRY): POC GLUCOSE: 282 mg/dL — AB (ref 70–99)

## 2016-03-10 LAB — POCT GLYCOSYLATED HEMOGLOBIN (HGB A1C): HEMOGLOBIN A1C: 11.6

## 2016-03-10 MED ORDER — CIPROFLOXACIN HCL 500 MG PO TABS: 500.0000 mg | ORAL_TABLET | Freq: Two times a day (BID) | ORAL | 0 refills | Status: DC

## 2016-03-10 MED ORDER — INSULIN PEN NEEDLE 31G X 5 MM MISC: 1.0000 | Freq: Two times a day (BID) | 5 refills | Status: DC

## 2016-03-10 MED ORDER — DULOXETINE HCL 60 MG PO CPEP: 60.0000 mg | ORAL_CAPSULE | Freq: Every day | ORAL | 3 refills | Status: DC

## 2016-03-10 NOTE — Patient Instructions (Signed)

## 2016-03-10 NOTE — Progress Notes (Signed)
Subjective:  Patient ID: Sarah Kelley, female    DOB: Sep 08, 1950  Age: 65 y.o. 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 Her chronic kidney disease is currently managed by [facility].  She complains of persisting pain in her legs and associated numbness which is not relieved by the gabapentin she has been taking. Pain feels like it is on her skin but also when she palpates her right medial thigh she feels the pain.  Complains of dark colored urine, dysuria for the last 2 weeks; denies fever, nausea. Endorses some abdominal pain and has not taken any OTC medications for this.  Past Medical History:  Diagnosis Date  . Diabetes mellitus   . Hyperlipidemia   . Hypertension   . Renal disorder   . Thyroid disease     Past Surgical History:  Procedure Laterality Date  . AMPUTATION Left 05/21/2015   Procedure: Left Foot 3rd Ray Amputation;  Surgeon: Newt Minion, MD;  Location: [place];  Service: Orthopedics;  Laterality: Left;    No Known Allergies   Outpatient Medications Prior to Visit  Medication Sig Dispense Refill  . acetaminophen (TYLENOL) 325 MG tablet Take 650 mg by mouth every 6 (six) hours as needed for mild pain. Reported on 09/13/2015    . atorvastatin (LIPITOR) 40 MG tablet Take 1 tablet (40 mg total) by mouth daily. 30 tablet 2  . cetirizine (ZYRTEC) 10 MG tablet Take 1 tablet (10 mg total) by mouth daily. 30 tablet 3  . gabapentin (NEURONTIN) 300 MG capsule Take 2 capsules (600 mg total) by mouth at bedtime. 60 capsule 3  . insulin glargine (LANTUS) 100 UNIT/ML injection Inject 0.3 mLs (30 Units total) into the skin 2 (two) times daily. 60  mL 2  . pantoprazole (PROTONIX) 40 MG tablet Take 1 tablet (40 mg total) by mouth daily. 30 tablet 3  . valsartan (DIOVAN) 160 MG tablet Take 1 tablet (160 mg total) by mouth daily. 30 tablet 3  . amLODipine (NORVASC) 10 MG tablet Take 1 tablet (10 mg total) by mouth daily. (Patient not taking: Reported on 03/10/2016) 30 tablet 3  . iron polysaccharides (NIFEREX) 150 MG capsule Take 1 capsule (150 mg total) by mouth 2 (two) times daily. (Patient not taking: Reported on 06/14/2015) 60 capsule 0  . lactulose (CHRONULAC) 10 GM/15ML solution Take 15 mLs (10 g total) by mouth daily as needed for mild constipation. (Patient not taking: Reported on 03/10/2016) 473 mL 0  . meclizine (ANTIVERT) 25 MG tablet Take 1 tablet (25 mg total) by mouth 3 (three) times daily as needed. (Patient not taking: Reported on 03/10/2016) 30 tablet 3  . paricalcitol (ZEMPLAR) 1 MCG capsule Take 1 mcg by mouth daily. Reported on 09/13/2015     No facility-administered medications prior to visit.     ROS Review of Systems  Constitutional: Negative for activity change, appetite change and fatigue.  HENT: Negative for congestion, sinus pressure and sore throat.   Eyes: Negative for visual disturbance.  Respiratory: Negative for cough, chest tightness, shortness of breath and wheezing.   Cardiovascular: Negative for chest pain and palpitations.  Gastrointestinal: Negative for abdominal distention, abdominal pain and constipation.  Endocrine: Negative for polydipsia.  Genitourinary:       See hpi  Musculoskeletal: Positive for myalgias (bilateral thigh pain). Negative for arthralgias and back pain.  Skin: Negative for rash.  Neurological: Positive for numbness. Negative for tremors and light-headedness.  Hematological: Does not bruise/bleed easily.  Psychiatric/Behavioral: Negative for agitation and behavioral problems.    Objective:  BP 128/75 (BP Location: Right Arm, Patient Position: Sitting, Cuff Size: Large)   Pulse 71    Temp 98.4 F (36.9 C) (Oral)   Ht 4' 11.5" (1.511 m)   Wt 162 lb 9.6 oz (73.8 kg)   SpO2 99%   BMI 32.29 kg/m   BP/Weight 03/10/2016 01/13/2016 1/61/0960  Systolic BP 454 098 119  Diastolic BP 75 81 77  Wt. (Lbs) 162.6 159 159.4  BMI 32.29 32.1 32.18      Physical Exam Constitutional: Patient appears well-developed and well-nourished. No distress. CVS: RRR, S1/S2 +, no murmurs, no gallops, no carotid bruit.  Pulmonary: Effort and breath sounds normal, no stridor, rhonchi, wheezes, rales.  Abdominal: Soft. BS +,  no distension, tenderness, rebound or guarding.  Musculoskeletal: Tenderness on palpation of medial aspect of right thigh  Lymphadenopathy: No lymphadenopathy noted, cervical, inguinal or axillary Neuro: Alert. Normal reflexes, muscle tone coordination. No cranial nerve deficit. Skin: Skin is warm and dry. No rash noted. Not diaphoretic. No erythema. No pallor. Psychiatric: Normal mood and affect. Behavior, judgment, thought content normal.  Assessment & Plan:   1. Other diabetic neurological complication associated with type 2 diabetes mellitus ([place]) Uncontrolled diabetes mellitus with A1c of 11.6 The patient has been taking 25 units of Lantus twice daily rather than 30 units which she was prescribed and this has been corrected today.  Cymbalta added to gabapentin for diabetic neuropathy which could explain right thigh pain Will see back for diabetic follow-up at next visit. - Glucose (CBG) - HgB A1c - DULoxetine (CYMBALTA) 60 MG capsule; Take 1 capsule (60 mg total) by mouth daily.  Dispense: 30 capsule; Refill: 3 - Insulin Pen Needle 31G X 5 MM MISC; 1 each by Does not apply route 2 (two) times daily.  Dispense: 60 each; Refill: 5  2. Urinary tract infection, site not specified Advised to increase water intake and cranberry juice - Urinalysis Dipstick - ciprofloxacin (CIPRO) 500 MG tablet; Take 1 tablet (500 mg total) by mouth 2 (two) times daily.  Dispense: 6  tablet; Refill: 0   Meds ordered this encounter  Medications  . DULoxetine (CYMBALTA) 60 MG capsule    Sig: Take 1 capsule (60 mg total) by mouth daily.    Dispense:  30 capsule    Refill:  3  . ciprofloxacin (CIPRO) 500 MG tablet    Sig: Take 1 tablet (500 mg total) by mouth 2 (two) times daily.    Dispense:  6 tablet    Refill:  0  . Insulin Pen Needle 31G X 5 MM MISC    Sig: 1 each by Does not apply route 2 (two) times daily.    Dispense:  60 each    Refill:  5    Follow-up: Return in about 6 weeks (around 04/21/2016) for Follow-up on diabetes mellitus.   Arnoldo Morale MD

## 2016-03-10 NOTE — Progress Notes (Signed)
Urine is "dark yellow", painful and burning

## 2016-03-13 ENCOUNTER — Ambulatory Visit: Payer: Self-pay | Admitting: Surgery

## 2016-03-13 MED FILL — DULoxetine HCL 60 MG CPEP: 60 | 30 days supply | Qty: 30 | Fill #0

## 2016-03-14 ENCOUNTER — Ambulatory Visit (HOSPITAL_COMMUNITY)
Admission: RE | Admit: 2016-03-14 | Discharge: 2016-03-14 | Disposition: A | Discharge status: Home / Self Care | Payer: Self-pay | Source: Ambulatory Visit | Attending: Vascular Surgery | Admitting: Vascular Surgery

## 2016-03-14 ENCOUNTER — Ambulatory Visit (INDEPENDENT_AMBULATORY_CARE_PROVIDER_SITE_OTHER)
Admission: RE | Admit: 2016-03-14 | Discharge: 2016-03-14 | Disposition: A | Discharge status: Home / Self Care | Payer: Self-pay | Source: Ambulatory Visit | Attending: Vascular Surgery | Admitting: Vascular Surgery

## 2016-03-14 DIAGNOSIS — N184 Chronic kidney disease, stage 4 (severe): Secondary | ICD-10-CM

## 2016-03-14 DIAGNOSIS — Z01810 Encounter for preprocedural cardiovascular examination: Secondary | ICD-10-CM | POA: Insufficient documentation

## 2016-03-16 ENCOUNTER — Encounter: Payer: Self-pay | Admitting: Vascular Surgery

## 2016-03-17 ENCOUNTER — Encounter: Payer: Self-pay | Admitting: Vascular Surgery

## 2016-03-17 ENCOUNTER — Other Ambulatory Visit: Payer: Self-pay | Admitting: *Deleted

## 2016-03-17 ENCOUNTER — Encounter: Payer: Self-pay | Admitting: *Deleted

## 2016-03-17 ENCOUNTER — Ambulatory Visit (INDEPENDENT_AMBULATORY_CARE_PROVIDER_SITE_OTHER): Payer: Self-pay | Admitting: Vascular Surgery

## 2016-03-17 VITALS — BP 118/70 | HR 77 | Ht 59.5 in | Wt 161.0 lb

## 2016-03-17 DIAGNOSIS — N184 Chronic kidney disease, stage 4 (severe): Secondary | ICD-10-CM

## 2016-03-17 MED FILL — ATORVASTATIN 20 MG TABLET: 20 | 30 days supply | Qty: 30 | Fill #2

## 2016-03-17 MED FILL — ?PANTOPRAZOLE SOD DR 40MG: 40 MG | 30 days supply | Qty: 30 | Fill #2

## 2016-03-17 MED FILL — VALSARTAN 160 MG TABLET: 160 | 30 days supply | Qty: 30 | Fill #1

## 2016-03-17 NOTE — Progress Notes (Signed)
Patient ID: Anamari Galeas, female   DOB: [birth year], 65 y.o.   MRN: [id number]  Reason for Consult: New Evaluation (eval for access - Dr. Edrick Oh)   Referred by Arnoldo Morale, MD  Subjective:     HPI:  Deyonna Fitzsimmons is a 65 y.o. female here for evaluation for dialysis access placement. She was EKG as result of diabetes per Dr. Jason Nest notes. She is Spanish speaking only accompanied by interpreter today. She denies any previous arm surgeries does not have any pain and has a poor understanding of dialysis in general.  Past Medical History:  Diagnosis Date  . Diabetes mellitus   . Hyperlipidemia   . Hypertension   . Renal disorder   . Thyroid disease    No family history on file. Past Surgical History:  Procedure Laterality Date  . AMPUTATION Left 05/21/2015   Procedure: Left Foot 3rd Ray Amputation;  Surgeon: Newt Minion, MD;  Location: [place];  Service: Orthopedics;  Laterality: Left;    Short Social History:  Social History  Substance Use Topics  . Smoking status: Never Smoker  . Smokeless tobacco: Never Used  . Alcohol use No    No Known Allergies  Current Outpatient Prescriptions  Medication Sig Dispense Refill  . acetaminophen (TYLENOL) 325 MG tablet Take 650 mg by mouth every 6 (six) hours as needed for mild pain. Reported on 09/13/2015    . atorvastatin (LIPITOR) 40 MG tablet Take 1 tablet (40 mg total) by mouth daily. 30 tablet 2  . cetirizine (ZYRTEC) 10 MG tablet Take 1 tablet (10 mg total) by mouth daily. 30 tablet 3  . DULoxetine (CYMBALTA) 60 MG capsule Take 1 capsule (60 mg total) by mouth daily. 30 capsule 3  . gabapentin (NEURONTIN) 300 MG capsule Take 2 capsules (600 mg total) by mouth at bedtime. 60 capsule 3  . valsartan (DIOVAN) 160 MG tablet Take 1 tablet (160 mg total) by mouth daily. 30 tablet 3  . amLODipine (NORVASC) 10 MG tablet Take 1 tablet (10 mg total) by mouth daily. (Patient not taking: Reported on 03/10/2016) 30 tablet  3  . ciprofloxacin (CIPRO) 500 MG tablet Take 1 tablet (500 mg total) by mouth 2 (two) times daily. (Patient not taking: Reported on 03/17/2016) 6 tablet 0  . insulin glargine (LANTUS) 100 UNIT/ML injection Inject 0.3 mLs (30 Units total) into the skin 2 (two) times daily. (Patient not taking: Reported on 03/17/2016) 60 mL 2  . Insulin Pen Needle 31G X 5 MM MISC 1 each by Does not apply route 2 (two) times daily. (Patient not taking: Reported on 03/17/2016) 60 each 5  . iron polysaccharides (NIFEREX) 150 MG capsule Take 1 capsule (150 mg total) by mouth 2 (two) times daily. (Patient not taking: Reported on 03/17/2016) 60 capsule 0  . lactulose (CHRONULAC) 10 GM/15ML solution Take 15 mLs (10 g total) by mouth daily as needed for mild constipation. (Patient not taking: Reported on 03/17/2016) 473 mL 0  . meclizine (ANTIVERT) 25 MG tablet Take 1 tablet (25 mg total) by mouth 3 (three) times daily as needed. (Patient not taking: Reported on 03/10/2016) 30 tablet 3  . neomycin-polymyxin-dexamethasone (MAXITROL) 0.1 % ophthalmic suspension Place 1 drop into the right eye 4 (four) times daily.    . pantoprazole (PROTONIX) 40 MG tablet Take 1 tablet (40 mg total) by mouth daily. (Patient not taking: Reported on 03/17/2016) 30 tablet 3  . paricalcitol (ZEMPLAR) 1 MCG capsule Take 1 mcg by mouth daily.  Reported on 09/13/2015     No current facility-administered medications for this visit.     Review of Systems  Constitutional:  Constitutional negative. HENT: HENT negative.  Eyes: Positive for loss of vision.   Respiratory: Respiratory negative.  Cardiovascular: Cardiovascular negative.  GI: Gastrointestinal negative.  GU: Genitourinary negative. Musculoskeletal: Musculoskeletal negative.  Skin: Skin negative.  Neurological: Neurological negative. Hematologic: Hematologic/lymphatic negative.  Psychiatric: Psychiatric negative.        Objective:  Objective   Vitals:   03/17/16 1408  BP: 118/70  Pulse:  77  SpO2: 97%  Weight: 161 lb (73 kg)  Height: 4' 11.5" (1.511 m)   Body mass index is 31.97 kg/m.  Physical Exam  Constitutional: She is oriented to person, place, and time. She appears well-developed.  HENT:  Head: Normocephalic.  Eyes: EOM are normal.  Neck: Normal range of motion.  Cardiovascular: Normal rate and normal heart sounds.   No murmur heard. Pulmonary/Chest: Effort normal and breath sounds normal.  Abdominal: Soft. She exhibits no mass.  Musculoskeletal: Normal range of motion. She exhibits no edema.  Lymphadenopathy:    She has no cervical adenopathy.  Neurological: She is alert and oriented to person, place, and time.  Skin: Skin is warm and dry.  Psychiatric: She has a normal mood and affect. Her behavior is normal. Thought content normal.    Data: Left brachial artery is 0.28 cm in diameter at the level of the antecubital fossa. She has cephalic vein on the left at the antecubital fossa that is 0.46 cm does taper to 0.27 at the level of the shoulder.     Assessment/Plan:  65 year old Spanish-speaking female here for evaluation of the initial dialysis access placement. She has duplexes of her arteries and vein today demonstrating marginal for left brachiocephalic. I discussed the risks and benefits including injury to the artery vein and nerve possible dysfunction of her left upper extremity coring further operation possible ligation of her fistula the requirement for future procedures to maintain access. Via interpreter she demonstrates good understanding and is willing to proceed. We will look for a date to schedule left upper extremity AV fistula likely brachiocephalic.     Waynetta Sandy MD [facility]

## 2016-03-27 ENCOUNTER — Encounter (HOSPITAL_COMMUNITY): Payer: Self-pay | Admitting: *Deleted

## 2016-03-28 ENCOUNTER — Encounter (HOSPITAL_COMMUNITY): Payer: Self-pay | Admitting: *Deleted

## 2016-03-28 ENCOUNTER — Ambulatory Visit (HOSPITAL_COMMUNITY): Payer: Self-pay | Admitting: Anesthesiology

## 2016-03-28 ENCOUNTER — Encounter (HOSPITAL_COMMUNITY)
Admission: RE | Disposition: A | Discharge status: Home / Self Care | Payer: Self-pay | Source: Ambulatory Visit | Attending: Vascular Surgery

## 2016-03-28 ENCOUNTER — Ambulatory Visit (HOSPITAL_COMMUNITY)
Admission: RE | Admit: 2016-03-28 | Discharge: 2016-03-28 | Disposition: A | Discharge status: Home / Self Care | Payer: Self-pay | Source: Ambulatory Visit | Attending: Vascular Surgery | Admitting: Vascular Surgery

## 2016-03-28 DIAGNOSIS — E1122 Type 2 diabetes mellitus with diabetic chronic kidney disease: Secondary | ICD-10-CM | POA: Insufficient documentation

## 2016-03-28 DIAGNOSIS — N184 Chronic kidney disease, stage 4 (severe): Secondary | ICD-10-CM | POA: Insufficient documentation

## 2016-03-28 DIAGNOSIS — D649 Anemia, unspecified: Secondary | ICD-10-CM | POA: Insufficient documentation

## 2016-03-28 DIAGNOSIS — Z89422 Acquired absence of other left toe(s): Secondary | ICD-10-CM | POA: Insufficient documentation

## 2016-03-28 DIAGNOSIS — N185 Chronic kidney disease, stage 5: Secondary | ICD-10-CM

## 2016-03-28 DIAGNOSIS — E118 Type 2 diabetes mellitus with unspecified complications: Secondary | ICD-10-CM

## 2016-03-28 DIAGNOSIS — E1140 Type 2 diabetes mellitus with diabetic neuropathy, unspecified: Secondary | ICD-10-CM | POA: Insufficient documentation

## 2016-03-28 DIAGNOSIS — Z79899 Other long term (current) drug therapy: Secondary | ICD-10-CM | POA: Insufficient documentation

## 2016-03-28 DIAGNOSIS — I129 Hypertensive chronic kidney disease with stage 1 through stage 4 chronic kidney disease, or unspecified chronic kidney disease: Secondary | ICD-10-CM | POA: Insufficient documentation

## 2016-03-28 DIAGNOSIS — E785 Hyperlipidemia, unspecified: Secondary | ICD-10-CM | POA: Insufficient documentation

## 2016-03-28 DIAGNOSIS — Z794 Long term (current) use of insulin: Secondary | ICD-10-CM | POA: Insufficient documentation

## 2016-03-28 HISTORY — DX: Polyneuropathy, unspecified: G62.9

## 2016-03-28 HISTORY — PX: AV FISTULA PLACEMENT: SHX1204

## 2016-03-28 LAB — POCT I-STAT 4, (NA,K, GLUC, HGB,HCT)
Glucose, Bld: 298 mg/dL — ABNORMAL HIGH (ref 65–99)
HCT: 30 % — ABNORMAL LOW (ref 36.0–46.0)
HEMOGLOBIN: 10.2 g/dL — AB (ref 12.0–15.0)
POTASSIUM: 4.2 mmol/L (ref 3.5–5.1)
SODIUM: 139 mmol/L (ref 135–145)

## 2016-03-28 LAB — GLUCOSE, CAPILLARY
Glucose-Capillary: 287 mg/dL — ABNORMAL HIGH (ref 65–99)
Glucose-Capillary: 244 mg/dL — ABNORMAL HIGH (ref 65–99)

## 2016-03-28 SURGERY — ARTERIOVENOUS (AV) FISTULA CREATION
Anesthesia: Monitor Anesthesia Care | Site: Arm Upper | Laterality: Left

## 2016-03-28 MED ORDER — LIDOCAINE-EPINEPHRINE (PF) 1 %-1:200000 IJ SOLN
INTRAMUSCULAR | Status: DC | PRN
  Administered 2016-03-28: 30 mL

## 2016-03-28 MED ORDER — PROPOFOL 500 MG/50ML IV EMUL
INTRAVENOUS | Status: AC
Start: 2016-03-28 — End: 2016-03-28
  Filled 2016-03-28: qty 100

## 2016-03-28 MED ORDER — EPHEDRINE SULFATE 50 MG/ML IJ SOLN
INTRAMUSCULAR | Status: DC | PRN
  Administered 2016-03-28 (×2): 10 mg via INTRAVENOUS

## 2016-03-28 MED ORDER — SODIUM CHLORIDE 0.9 % IV SOLN
INTRAVENOUS | Status: DC
  Administered 2016-03-28: 08:00:00 via INTRAVENOUS

## 2016-03-28 MED ORDER — PROPOFOL 1000 MG/100ML IV EMUL
INTRAVENOUS | Status: AC
  Filled 2016-03-28: qty 200

## 2016-03-28 MED ORDER — MIDAZOLAM HCL 2 MG/2ML IJ SOLN
INTRAMUSCULAR | Status: AC
  Filled 2016-03-28: qty 2

## 2016-03-28 MED ORDER — INSULIN GLARGINE 100 UNIT/ML ~~LOC~~ SOLN: 25.0000 [IU] | Freq: Two times a day (BID) | SUBCUTANEOUS | Status: DC

## 2016-03-28 MED ORDER — CHLORHEXIDINE GLUCONATE CLOTH 2 % EX PADS: 6.0000 | MEDICATED_PAD | Freq: Once | CUTANEOUS | Status: DC

## 2016-03-28 MED ORDER — PROPOFOL 10 MG/ML IV BOLUS
INTRAVENOUS | Status: AC
  Filled 2016-03-28: qty 40

## 2016-03-28 MED ORDER — FENTANYL CITRATE (PF) 100 MCG/2ML IJ SOLN: 25.0000 ug | INTRAMUSCULAR | Status: DC | PRN

## 2016-03-28 MED ORDER — OXYCODONE-ACETAMINOPHEN 5-325 MG PO TABS: 1.0000 | ORAL_TABLET | Freq: Four times a day (QID) | ORAL | 0 refills | Status: DC | PRN

## 2016-03-28 MED ORDER — ATORVASTATIN CALCIUM 40 MG PO TABS: 20.0000 mg | ORAL_TABLET | Freq: Every day | ORAL | Status: DC

## 2016-03-28 MED ORDER — FENTANYL CITRATE (PF) 100 MCG/2ML IJ SOLN
INTRAMUSCULAR | Status: DC | PRN
  Administered 2016-03-28: 100 ug via INTRAVENOUS

## 2016-03-28 MED ORDER — 0.9 % SODIUM CHLORIDE (POUR BTL) OPTIME
TOPICAL | Status: DC | PRN
Start: 2016-03-28 — End: 2016-03-28
  Administered 2016-03-28: 1000 mL

## 2016-03-28 MED ORDER — SODIUM CHLORIDE 0.9 % IV SOLN
INTRAVENOUS | Status: DC | PRN
  Administered 2016-03-28: 80 ug via INTRAVENOUS

## 2016-03-28 MED ORDER — FENTANYL CITRATE (PF) 100 MCG/2ML IJ SOLN
INTRAMUSCULAR | Status: AC
  Filled 2016-03-28: qty 2

## 2016-03-28 MED ORDER — MIDAZOLAM HCL 5 MG/5ML IJ SOLN
INTRAMUSCULAR | Status: DC | PRN
  Administered 2016-03-28: 2 mg via INTRAVENOUS

## 2016-03-28 MED ORDER — SODIUM CHLORIDE 0.9 % IV SOLN
INTRAVENOUS | Status: DC | PRN
  Administered 2016-03-28: 500 mL

## 2016-03-28 MED ORDER — PROPOFOL 500 MG/50ML IV EMUL
INTRAVENOUS | Status: DC | PRN
  Administered 2016-03-28: 35 ug/kg/min via INTRAVENOUS

## 2016-03-28 MED ORDER — DEXTROSE 5 % IV SOLN
1.5000 g | INTRAVENOUS | Status: AC
  Administered 2016-03-28: 1.5 g via INTRAVENOUS
  Filled 2016-03-28: qty 1.5

## 2016-03-28 SURGICAL SUPPLY — 27 items
ARMBAND PINK RESTRICT EXTREMIT (MISCELLANEOUS) ×2 IMPLANT
CANISTER SUCTION 2500CC (MISCELLANEOUS) ×2 IMPLANT
CLIP TI MEDIUM 6 (CLIP) ×3 IMPLANT
CLIP TI WIDE RED SMALL 6 (CLIP) ×3 IMPLANT
COVER PROBE W GEL 5X96 (DRAPES) ×2 IMPLANT
ELECT REM PT RETURN 9FT ADLT (ELECTROSURGICAL) ×2
ELECTRODE REM PT RTRN 9FT ADLT (ELECTROSURGICAL) ×1 IMPLANT
GLOVE BIO SURGEON STRL SZ 6.5 (GLOVE) ×4 IMPLANT
GLOVE BIO SURGEON STRL SZ7.5 (GLOVE) ×2 IMPLANT
GLOVE SKINSENSE NS SZ7.0 (GLOVE) ×2
GLOVE SKINSENSE STRL SZ7.0 (GLOVE) IMPLANT
GOWN STRL REUS W/ TWL LRG LVL3 (GOWN DISPOSABLE) ×2 IMPLANT
GOWN STRL REUS W/ TWL XL LVL3 (GOWN DISPOSABLE) ×1 IMPLANT
GOWN STRL REUS W/TWL LRG LVL3 (GOWN DISPOSABLE) ×2
GOWN STRL REUS W/TWL XL LVL3 (GOWN DISPOSABLE) ×6
KIT BASIN OR (CUSTOM PROCEDURE TRAY) ×2 IMPLANT
KIT ROOM TURNOVER OR (KITS) ×2 IMPLANT
LIQUID BAND (GAUZE/BANDAGES/DRESSINGS) ×2 IMPLANT
NS IRRIG 1000ML POUR BTL (IV SOLUTION) ×2 IMPLANT
PACK CV ACCESS (CUSTOM PROCEDURE TRAY) ×2 IMPLANT
PAD ARMBOARD 7.5X6 YLW CONV (MISCELLANEOUS) ×4 IMPLANT
SUT MNCRL AB 4-0 PS2 18 (SUTURE) ×2 IMPLANT
SUT PROLENE 6 0 BV (SUTURE) ×2 IMPLANT
SUT VIC AB 3-0 SH 27 (SUTURE) ×2
SUT VIC AB 3-0 SH 27X BRD (SUTURE) ×1 IMPLANT
UNDERPAD 30X30 (UNDERPADS AND DIAPERS) ×2 IMPLANT
WATER STERILE IRR 1000ML POUR (IV SOLUTION) ×2 IMPLANT

## 2016-03-28 NOTE — Anesthesia Postprocedure Evaluation (Signed)
Anesthesia Post Note  Patient: Sarah Kelley  Procedure(s) Performed: Procedure(s) (LRB): ARTERIOVENOUS (AV) FISTULA CREATION LEFT ARM (Left)  Patient location during evaluation: PACU Anesthesia Type: General Level of consciousness: sedated Pain management: satisfactory to patient Vital Signs Assessment: post-procedure vital signs reviewed and stable Respiratory status: spontaneous breathing Cardiovascular status: stable Anesthetic complications: no    Last Vitals:  Vitals:   03/28/16 1000 03/28/16 1010  BP: (!) 138/51 (!) 152/51  Pulse: 71   Resp: 16 18  Temp:  36.3 C    Last Pain:  Vitals:   03/28/16 1010  TempSrc:   PainSc: 0-No pain                 Zosia Lucchese EDWARD

## 2016-03-28 NOTE — Anesthesia Preprocedure Evaluation (Addendum)
Anesthesia Evaluation  Patient identified by MRN, date of birth, ID band Patient awake    Reviewed: Allergy & Precautions, H&P , Patient's Chart, lab work & pertinent test results, reviewed documented beta blocker date and time   Airway Mallampati: II  TM Distance: >3 FB Neck ROM: full    Dental no notable dental hx.    Pulmonary    Pulmonary exam normal breath sounds clear to auscultation       Cardiovascular hypertension, On Medications  Rhythm:regular Rate:Normal     Neuro/Psych    GI/Hepatic   Endo/Other  diabetes, Type 1  Renal/GU      Musculoskeletal   Abdominal   Peds  Hematology  (+) anemia ,   Anesthesia Other Findings   Reproductive/Obstetrics                            Anesthesia Physical Anesthesia Plan  ASA: III  Anesthesia Plan: MAC   Post-op Pain Management:    Induction: Intravenous  Airway Management Planned: Mask and Natural Airway  Additional Equipment:   Intra-op Plan:   Post-operative Plan:   Informed Consent: I have reviewed the patients History and Physical, chart, labs and discussed the procedure including the risks, benefits and alternatives for the proposed anesthesia with the patient or authorized representative who has indicated his/her understanding and acceptance.   Dental Advisory Given  Plan Discussed with: CRNA and Surgeon  Anesthesia Plan Comments: (Discussed sedation and potential to need to place airway or ETT if warranted by clinical changes intra-operatively. We will start procedure as MAC.)        Anesthesia Quick Evaluation

## 2016-03-28 NOTE — H&P (Signed)
     History and Physical Update  The patient was interviewed and re-examined.  The patient's previous History and Physical has been reviewed and is unchanged from office visit. Proceed with LUE avf.   Isley Weisheit C. Donzetta Matters, MD [facility] [place] Office: [phone] Pager: [phone]   03/28/2016, 7:16 AM

## 2016-03-28 NOTE — Op Note (Signed)
    OPERATIVE NOTE   PROCEDURE: left brachiocephalic arteriovenous fistula placement  PRE-OPERATIVE DIAGNOSIS: ckd  POST-OPERATIVE DIAGNOSIS: same  SURGEON: Cornesha Radziewicz C. Donzetta Matters, MD  ASSISTANT(S): Leontine Locket, PA  ANESTHESIA: MAC  ESTIMATED BLOOD LOSS: 20 cc  FINDING(S): Cephalic vein dilated to 62mm, suitable brachial artery for access creation. Basilic vein ligated at antecubitum  SPECIMEN(S):  none  INDICATIONS:   Sarah Kelley is a 65 y.o. female who presents with ckd.  The patient is scheduled for left brachiocephalic arteriovenous fistula placement.  The patient is aware the risks include but are not limited to: bleeding, infection, steal syndrome, nerve damage, ischemic monomelic neuropathy, failure to mature, and need for additional procedures.  The patient is aware of the risks of the procedure and elects to proceed forward.  DESCRIPTION: After full informed written consent was obtained from the patient, the patient was brought back to the operating room and placed supine upon the operating table.  Prior to induction, the patient received IV antibiotics.   After obtaining adequate anesthesia, the patient was then prepped and draped in the standard fashion for a left arm access procedure.  I turned my attention first to identifying the patient's cephalic vein and brachial artery.  Using SonoSite guidance, the location of these vessels were marked out on the skin.   At this point, I injected local anesthetic to obtain a field block of the antecubitum.  In total, I injected about 10 mL of 1% lidocaine with epinephrine.  I made a transverse incision at the level of the antecubitum and dissected through the subcutaneous tissue and fascia to gain exposure of the brachial artery.  This was dissected out proximally and distally and controlled with vessel loops .  I then dissected out the cephalic vein.  The distal segment of the vein was ligated with a  2-0 silk, and the vein was  transected.  The proximal segment was interrogated with serial dilators.  The vein accepted up to a 4 mm dilator without any difficulty.  I then instilled the heparinized saline into the vein and clamped it.  An arteriotomy with a #11 blade, and then I extended the arteriotomy with a Potts scissor.  I injected heparinized saline proximal and distal to this arteriotomy.  The vein was then sewn to the artery in an end-to-side configuration with a running stitch of 6-0 Prolene.  Prior to completing this anastomosis, I allowed the vein and artery to backbleed.  There was no evidence of clot from any vessels.  I completed the anastomosis in the usual fashion and then released all vessel loops and clamps.  There was a palpable  thrill in the venous outflow, and there was a signal at the radial pulse.  At this point, I irrigated out the surgical wound.  There was no further active bleeding.  The subcutaneous tissue was reapproximated with a running stitch of 3-0 Vicryl.  The skin was then reapproximated with a running subcuticular stitch of 4-0 Vicryl.  The skin was then cleaned, dried, and reinforced with Dermabond.  The patient tolerated this procedure well.   COMPLICATIONS: none  CONDITION: stable   Jeiden Daughtridge C. Donzetta Matters, MD [facility] [place] Office: [phone] Pager: [phone]  03/28/2016, 8:57 AM

## 2016-03-28 NOTE — Anesthesia Procedure Notes (Signed)
Procedure Name: LMA Insertion Date/Time: 03/28/2016 8:16 AM Performed by: Ebbie Latus E Pre-anesthesia Checklist: Patient identified, Suction available, Emergency Drugs available, Patient being monitored and Timeout performed Patient Re-evaluated:Patient Re-evaluated prior to inductionOxygen Delivery Method: Circle System Utilized Preoxygenation: Pre-oxygenation with 100% oxygen Intubation Type: IV induction Ventilation: Mask ventilation without difficulty LMA: LMA inserted LMA Size: 4.0 Number of attempts: 1 Placement Confirmation: positive ETCO2 Tube secured with: Tape Dental Injury: Teeth and Oropharynx as per pre-operative assessment

## 2016-03-28 NOTE — Discharge Instructions (Signed)
° ° °  03/28/2016 Sarah Kelley [id number] 16-Mar-1951  Surgeon(s): Waynetta Sandy, MD  Procedure(s): ARTERIOVENOUS (AV) FISTULA CREATION LEFT ARM  x Do not stick fistula for 12 weeks

## 2016-03-28 NOTE — Transfer of Care (Signed)
Immediate Anesthesia Transfer of Care Note  Patient: Sarah Kelley  Procedure(s) Performed: Procedure(s): ARTERIOVENOUS (AV) FISTULA CREATION LEFT ARM (Left)  Patient Location: PACU  Anesthesia Type:General  Level of Consciousness: awake, alert , oriented and sedated  Airway & Oxygen Therapy: Patient Spontanous Breathing and Patient connected to face mask oxygen  Post-op Assessment: Report given to RN, Post -op Vital signs reviewed and stable and Patient moving all extremities  Post vital signs: Reviewed and stable  Last Vitals:  Vitals:   03/28/16 0617 03/28/16 0907  BP: (!) 190/58 (!) (P) 131/56  Pulse: 69 (P) 75  Resp: 20 (P) 18  Temp: 37.1 C (P) 36.3 C    Last Pain:  Vitals:   03/28/16 0617  TempSrc: Oral      Patients Stated Pain Goal: 5 (99/69/24 9324)  Complications: No apparent anesthesia complications

## 2016-03-29 ENCOUNTER — Encounter (HOSPITAL_COMMUNITY): Payer: Self-pay | Admitting: Vascular Surgery

## 2016-03-29 ENCOUNTER — Telehealth: Payer: Self-pay | Admitting: Vascular Surgery

## 2016-03-29 ENCOUNTER — Other Ambulatory Visit: Payer: Self-pay | Admitting: *Deleted

## 2016-03-29 DIAGNOSIS — N186 End stage renal disease: Secondary | ICD-10-CM

## 2016-03-29 DIAGNOSIS — Z4931 Encounter for adequacy testing for hemodialysis: Secondary | ICD-10-CM

## 2016-03-29 NOTE — Telephone Encounter (Signed)
-----   Message from Mena Goes, RN sent at 03/29/2016  9:34 AM EDT ----- Regarding: 6 weeks   ----- Message ----- From: Gabriel Earing, PA-C Sent: 03/28/2016   8:55 AM To: [facility] Charge Pool  S/p left BC AVF 03/28/16 by Dr. Donzetta Matters.  F/u in 6 weeks with duplex.  Thanks, Aldona Bar

## 2016-03-29 NOTE — Telephone Encounter (Signed)
called home # no one speaks [place], mailing letter for appt, beg 03/29/16   appt is 11/10 [place] at 9 am f/u with doc at 77

## 2016-04-18 MED FILL — GABAPENTIN 300 MG CAPSULE: 300 | 30 days supply | Qty: 60 | Fill #0

## 2016-04-18 MED FILL — VALSARTAN 160 MG TABLET: 160 | 30 days supply | Qty: 30 | Fill #2

## 2016-04-18 MED FILL — PANTOPRAZOLE SOD DR 40 MG T: 40 | 30 days supply | Qty: 30 | Fill #3

## 2016-04-18 MED FILL — ATORVASTATIN 20 MG TABLET: 20 | 30 days supply | Qty: 30 | Fill #3

## 2016-05-10 ENCOUNTER — Encounter: Payer: Self-pay | Admitting: Vascular Surgery

## 2016-05-12 ENCOUNTER — Ambulatory Visit: Payer: Self-pay | Admitting: Vascular Surgery

## 2016-05-12 ENCOUNTER — Encounter (HOSPITAL_COMMUNITY): Payer: Self-pay

## 2016-05-30 ENCOUNTER — Other Ambulatory Visit: Payer: Self-pay | Admitting: Family Medicine

## 2016-05-30 DIAGNOSIS — K2990 Gastroduodenitis, unspecified, without bleeding: Principal | ICD-10-CM

## 2016-05-30 DIAGNOSIS — K2970 Gastritis, unspecified, without bleeding: Secondary | ICD-10-CM

## 2016-05-30 MED FILL — GABAPENTIN 300 MG CAPSULE: 300 | 30 days supply | Qty: 60 | Fill #1

## 2016-05-30 MED FILL — ATORVASTATIN 20 MG TABLET: 20 | 30 days supply | Qty: 30 | Fill #0

## 2016-05-30 MED FILL — VALSARTAN 160 MG TABLET: 160 | 30 days supply | Qty: 30 | Fill #3

## 2016-06-12 ENCOUNTER — Ambulatory Visit: Payer: Self-pay | Attending: Family Medicine | Admitting: Family Medicine

## 2016-06-12 ENCOUNTER — Encounter: Payer: Self-pay | Admitting: Family Medicine

## 2016-06-12 VITALS — BP 122/60 | HR 82 | Temp 98.6°F | Ht 60.0 in | Wt 173.2 lb

## 2016-06-12 DIAGNOSIS — Z9889 Other specified postprocedural states: Secondary | ICD-10-CM | POA: Insufficient documentation

## 2016-06-12 DIAGNOSIS — H578 Other specified disorders of eye and adnexa: Secondary | ICD-10-CM | POA: Insufficient documentation

## 2016-06-12 DIAGNOSIS — Z794 Long term (current) use of insulin: Secondary | ICD-10-CM | POA: Insufficient documentation

## 2016-06-12 DIAGNOSIS — I10 Essential (primary) hypertension: Secondary | ICD-10-CM

## 2016-06-12 DIAGNOSIS — N184 Chronic kidney disease, stage 4 (severe): Secondary | ICD-10-CM | POA: Insufficient documentation

## 2016-06-12 DIAGNOSIS — E1121 Type 2 diabetes mellitus with diabetic nephropathy: Secondary | ICD-10-CM | POA: Insufficient documentation

## 2016-06-12 DIAGNOSIS — E1165 Type 2 diabetes mellitus with hyperglycemia: Secondary | ICD-10-CM | POA: Insufficient documentation

## 2016-06-12 DIAGNOSIS — E1122 Type 2 diabetes mellitus with diabetic chronic kidney disease: Secondary | ICD-10-CM | POA: Insufficient documentation

## 2016-06-12 DIAGNOSIS — E7800 Pure hypercholesterolemia, unspecified: Secondary | ICD-10-CM | POA: Insufficient documentation

## 2016-06-12 DIAGNOSIS — H1031 Unspecified acute conjunctivitis, right eye: Secondary | ICD-10-CM | POA: Insufficient documentation

## 2016-06-12 DIAGNOSIS — E079 Disorder of thyroid, unspecified: Secondary | ICD-10-CM | POA: Insufficient documentation

## 2016-06-12 DIAGNOSIS — H5711 Ocular pain, right eye: Secondary | ICD-10-CM | POA: Insufficient documentation

## 2016-06-12 DIAGNOSIS — E1149 Type 2 diabetes mellitus with other diabetic neurological complication: Secondary | ICD-10-CM

## 2016-06-12 DIAGNOSIS — R0982 Postnasal drip: Secondary | ICD-10-CM | POA: Insufficient documentation

## 2016-06-12 DIAGNOSIS — Z992 Dependence on renal dialysis: Secondary | ICD-10-CM | POA: Insufficient documentation

## 2016-06-12 DIAGNOSIS — Z79899 Other long term (current) drug therapy: Secondary | ICD-10-CM | POA: Insufficient documentation

## 2016-06-12 DIAGNOSIS — E0849 Diabetes mellitus due to underlying condition with other diabetic neurological complication: Secondary | ICD-10-CM

## 2016-06-12 DIAGNOSIS — I129 Hypertensive chronic kidney disease with stage 1 through stage 4 chronic kidney disease, or unspecified chronic kidney disease: Secondary | ICD-10-CM | POA: Insufficient documentation

## 2016-06-12 DIAGNOSIS — E1140 Type 2 diabetes mellitus with diabetic neuropathy, unspecified: Secondary | ICD-10-CM | POA: Insufficient documentation

## 2016-06-12 LAB — POCT GLYCOSYLATED HEMOGLOBIN (HGB A1C): HEMOGLOBIN A1C: 9.8

## 2016-06-12 LAB — GLUCOSE, POCT (MANUAL RESULT ENTRY): POC Glucose: 224 mg/dl — AB (ref 70–99)

## 2016-06-12 MED ORDER — VALSARTAN 160 MG PO TABS: 160.0000 mg | ORAL_TABLET | Freq: Every day | ORAL | 3 refills | Status: DC

## 2016-06-12 MED ORDER — GABAPENTIN 300 MG PO CAPS: 600.0000 mg | ORAL_CAPSULE | Freq: Every day | ORAL | 3 refills | Status: DC

## 2016-06-12 MED ORDER — CETIRIZINE HCL 10 MG PO TABS: 10.0000 mg | ORAL_TABLET | Freq: Every day | ORAL | 3 refills | Status: DC

## 2016-06-12 MED ORDER — INSULIN GLARGINE 100 UNIT/ML ~~LOC~~ SOLN: SUBCUTANEOUS | 3 refills | Status: DC

## 2016-06-12 MED ORDER — POLYMYXIN B-TRIMETHOPRIM 10000-0.1 UNIT/ML-% OP SOLN: 1.0000 [drp] | OPHTHALMIC | 0 refills | Status: DC

## 2016-06-12 MED ORDER — ATORVASTATIN CALCIUM 40 MG PO TABS: 40.0000 mg | ORAL_TABLET | Freq: Every day | ORAL | 3 refills | Status: DC

## 2016-06-12 MED ORDER — DULOXETINE HCL 60 MG PO CPEP: 60.0000 mg | ORAL_CAPSULE | Freq: Every day | ORAL | 3 refills | Status: DC

## 2016-06-12 MED FILL — DULoxetine HCL 60 MG CPEP: 60 | 30 days supply | Qty: 30 | Fill #0

## 2016-06-12 MED FILL — !LANTUS 100 UNITS/ML VIAL: 100 | 20 days supply | Qty: 10 | Fill #0

## 2016-06-12 MED FILL — ?CETIRIZINE HCL 10 MG TABLE: 10 | 30 days supply | Qty: 30 | Fill #0

## 2016-06-12 MED FILL — POLYMYXIN B/TMP EYE DROPS: 10000-0.1 | 26 days supply | Qty: 10 | Fill #0

## 2016-06-12 MED FILL — ATORVASTATIN 40 MG TABLET: 40 | 30 days supply | Qty: 30 | Fill #0

## 2016-06-12 NOTE — Progress Notes (Signed)
Subjective:  Patient ID: Sarah Kelley, female    DOB: [birth year]  Age: 65 y.o. MRN: [id number]  CC: Eye Pain (right sided); Fatigue; and Diabetes   HPI Sarah Kelley is a 65 year old Spanish speaking female seen with the aid of a language line with a history of HTN, uncontrolled Type 2DM(A1c 9.8), Hyperlipidemia, CKD (Stage 4), Diabetic Neuropathy, Left  foot 3rd toe Osteomyelitis s/p amputation who comes in for a follow up office visit.  Her chronic kidney disease is currently managed by [facility] And she is status post placement of left AV fistula in preparation for dialysis; had appointment with nephrology comes up tomorrow.  Her husband has been adjusting her Lantus dose and administering anywhere from 20-25 units of insulin twice a day; I had prescribed 30 twice a day previously but he reports that she has had some fasting hypoglycemia in the 60-65 range however he does not check her random sugars. She complains of right eye redness, pain, watery discharge for the last 1 week but denies visual changes. She endorses postnasal drip and having to clear her throat frequently but denies sinus congestion or facial tenderness.   Past Medical History:  Diagnosis Date  . Diabetes mellitus   . Hyperlipidemia   . Hypertension   . Neuropathy ([place])    Diabetic  . Renal disorder   . Thyroid disease     Past Surgical History:  Procedure Laterality Date  . AMPUTATION Left 05/21/2015   Procedure: Left Foot 3rd Ray Amputation;  Surgeon: Newt Minion, MD;  Location: [facility];  Service: Orthopedics;  Laterality: Left;  . AV FISTULA PLACEMENT Left 03/28/2016   Procedure: ARTERIOVENOUS (AV) FISTULA CREATION LEFT ARM;  Surgeon: Waynetta Sandy, MD;  Location: [facility];  Service: Vascular;  Laterality: Left;  . DILATION AND CURETTAGE OF UTERUS      No Known Allergies   Outpatient Medications Prior to Visit  Medication Sig Dispense Refill  . atorvastatin  (LIPITOR) 40 MG tablet Take 0.5 tablets (20 mg total) by mouth daily.    Marland Kitchen gabapentin (NEURONTIN) 300 MG capsule Take 2 capsules (600 mg total) by mouth at bedtime. 60 capsule 3  . insulin glargine (LANTUS) 100 UNIT/ML injection Inject 0.25-0.3 mLs (25-30 Units total) into the skin 2 (two) times daily. Take 30 units every morning  Take 25 units every evening    . valsartan (DIOVAN) 160 MG tablet Take 1 tablet (160 mg total) by mouth daily. 30 tablet 3  . acetaminophen (TYLENOL) 325 MG tablet Take 650 mg by mouth every 6 (six) hours as needed for mild pain. Reported on 09/13/2015    . oxyCODONE-acetaminophen (ROXICET) 5-325 MG tablet Take 1 tablet by mouth every 6 (six) hours as needed for severe pain. 8 tablet 0  . DULoxetine (CYMBALTA) 60 MG capsule Take 1 capsule (60 mg total) by mouth daily. 30 capsule 3   No facility-administered medications prior to visit.     ROS Review of Systems  Constitutional: Negative for activity change, appetite change and fatigue.  HENT: Positive for postnasal drip. Negative for congestion, sinus pressure and sore throat.   Eyes: Positive for pain, discharge, redness and itching. Negative for visual disturbance.  Respiratory: Negative for cough, chest tightness, shortness of breath and wheezing.   Cardiovascular: Negative for chest pain and palpitations.  Gastrointestinal: Negative for abdominal distention, abdominal pain and constipation.  Endocrine: Negative for polydipsia.  Genitourinary: Negative for dysuria and frequency.  Musculoskeletal: Negative for arthralgias  and back pain.  Skin: Negative for rash.  Neurological: Negative for tremors, light-headedness and numbness.  Hematological: Does not bruise/bleed easily.  Psychiatric/Behavioral: Negative for agitation and behavioral problems.    Objective:  BP 122/60 (BP Location: Right Arm, Patient Position: Sitting, Cuff Size: Large)   Pulse 82   Temp 98.6 F (37 C) (Oral)   Ht 5' (1.524 m)   Wt 173  lb 3.2 oz (78.6 kg)   SpO2 100%   BMI 33.83 kg/m   BP/Weight 06/12/2016 03/28/2016 6/75/9163  Systolic BP 846 659 935  Diastolic BP 60 51 70  Wt. (Lbs) 173.2 - 161  BMI 33.83 - 31.97      Physical Exam Constitutional: Patient appears well-developed and well-nourished. No distress. HEENT: Erythema of conjunctiva of the right eye; eyes normal. TM is normal bilaterally, normal oropharynx  CVS: RRR, S1/S2 +, no murmurs, no gallops, no carotid bruit.  Pulmonary: Effort and breath sounds normal, no stridor, rhonchi, wheezes, rales.  Abdominal: Soft. BS +,  no distension, tenderness, rebound or guarding.  Musculoskeletal: Left brachiocephalic AV fistula not yet mature Lymphadenopathy: No lymphadenopathy noted, cervical, inguinal or axillary Neuro: Alert. Normal reflexes, muscle tone coordination. No cranial nerve deficit. Skin: Skin is warm and dry. No rash noted. Not diaphoretic. No erythema. No pallor. Psychiatric: Normal mood and affect. Behavior, judgment, thought content normal.  Lab Results  Component Value Date   HGBA1C 9.8 06/12/2016    Assessment & Plan:   1. Diabetes mellitus with nephropathy ([place]) Controlled with A1c of 9.8 Encouraged to take 30 units of Lantus in the morning and 20 the evening Check blood sugars AC and QHS and notify the clinic in the event of hypoglycemia Keep blood sugar logs with fasting goals of 80-120 mg/dl, random of less than 180 and in the event of sugars less than 60 mg/dl or greater than 400 mg/dl please notify the clinic ASAP. It is recommended that you undergo annual eye exams and annual foot exams. Pneumovax is recommended every 5 years before the age of 48 and once for a lifetime at or after the age of 27. - Glucose (CBG) - HgB A1c  2. Essential hypertension  controlled - valsartan (DIOVAN) 160 MG tablet; Take 1 tablet (160 mg total) by mouth daily.  Dispense: 30 tablet; Refill: 3  3. 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  Dispense: 60 capsule; Refill: 3  4. Other diabetic neurological complication associated with type 2 diabetes mellitus (HCC)  stable - DULoxetine (CYMBALTA) 60 MG capsule; Take 1 capsule (60 mg total) by mouth daily.  Dispense: 30 capsule; Refill: 3  5. Pure hypercholesterolemia  uncontrolled Low-cholesterol. - atorvastatin (LIPITOR) 40 MG tablet; Take 1 tablet (40 mg total) by mouth daily.  Dispense: 30 tablet; Refill: 3 - insulin glargine (LANTUS) 100 UNIT/ML injection; Take 30 units every morning  Take 20 units every evening  Dispense: 30 mL; Refill: 3  6. Acute bacterial conjunctivitis of right eye - trimethoprim-polymyxin b (POLYTRIM) ophthalmic solution; Place 1 drop into the right eye every 4 (four) hours.  Dispense: 10 mL; Refill: 0  7. Post-nasal drip - cetirizine (ZYRTEC) 10 MG tablet; Take 1 tablet (10 mg total) by mouth daily.  Dispense: 30 tablet; Refill: 3  8. Chronic kidney disease stage IV Status post AV fistula Keep appointment with [facility] tomorrow  Meds ordered this encounter  Medications  . valsartan (DIOVAN) 160 MG tablet    Sig: Take 1 tablet (160 mg total) by mouth daily.    Dispense:  30 tablet    Refill:  3    Discontinue Benicar  . gabapentin (NEURONTIN) 300 MG capsule    Sig: Take 2 capsules (600 mg total) by mouth at bedtime.    Dispense:  60 capsule    Refill:  3  . DULoxetine (CYMBALTA) 60 MG capsule    Sig: Take 1 capsule (60 mg total) by mouth daily.    Dispense:  30 capsule    Refill:  3  . atorvastatin (LIPITOR) 40 MG tablet    Sig: Take 1 tablet (40 mg total) by mouth daily.    Dispense:  30 tablet    Refill:  3    Discontinue previous dose  . insulin glargine (LANTUS) 100 UNIT/ML injection    Sig: Take 30 units every morning  Take 20 units every evening      Dispense:  30 mL    Refill:  3  . trimethoprim-polymyxin b (POLYTRIM) ophthalmic solution    Sig: Place 1 drop into the right eye every 4 (four) hours.    Dispense:  10 mL    Refill:  0  . cetirizine (ZYRTEC) 10 MG tablet    Sig: Take 1 tablet (10 mg total) by mouth daily.    Dispense:  30 tablet    Refill:  3    Follow-up: Return in about 3 months (around 09/10/2016) for follow up on Diabetes Mellitus.   Arnoldo Morale MD

## 2016-06-12 NOTE — Patient Instructions (Signed)
Conjuntivitis bacteriana (Bacterial Conjunctivitis) La conjuntivitis bacteriana es una infeccin de la membrana transparente que cubre la parte blanca del ojo y la cara interna del prpado (conjuntiva). Cuando los vasos sanguneos de la conjuntiva se inflaman, el ojo se pone rojo o Sarah Kelley, y es posible que le pique. La conjuntivitis bacteriana se transmite fcilmente de [place] persona a la otra (es contagiosa). Tambin se contagia fcilmente de un ojo al otro. CAUSAS La causa de esta afeccin son varias bacterias comunes. Puede contraer la infeccin si entra en contacto con otra persona que est infectada. Tambin puede entrar en contacto con elementos que estn contaminados con la bacteria, como una toalla para la cara, solucin para lentes de contacto o maquillaje para ojos. FACTORES DE RIESGO Es ms probable que esta afeccin se manifieste en las personas que:  Mantienen contacto fsico con personas que tienen la infeccin.  Usan lentes de contacto.  Tienen sinusitis.  Han tenido una lesin o Sarah Kelley reciente en el ojo.  Tiene debilitado el sistema de defensa del organismo (sistema inmunitario).  Tienen una afeccin mdica que causa sequedad en los ojos. SNTOMAS Los sntomas de esta afeccin incluyen lo siguiente:  Ojos rojos.  Lagrimeo u ojos llorosos.  Picazn en los ojos.  Sensacin de ardor en los ojos.  Secrecin espesa y Designer, fashion/clothing del ojo. Esta secrecin puede convertirse en una costra en el prpado durante la noche, que hace que los prpados se peguen.  Hinchazn de los prpados.  Visin borrosa. DIAGNSTICO El mdico puede diagnosticar esta afeccin en funcin de los sntomas y los antecedentes mdicos. El mdico tambin puede obtener una muestra de la secrecin del ojo para averiguar la causa de la infeccin. Esto no se hace con frecuencia. TRATAMIENTO El tratamiento de esta afeccin incluye lo siguiente:  Gotas o ungento para los ojos con antibitico para Sarah Kelley infeccin con ms rapidez y Sarah Kelley contagio a Sarah Kelley.  Antibiticos por va oral para tratar infecciones que no responden a las gotas o los ungentos, o que duran ms de 10das.  Paos hmedos fros (compresas fras) sobre los ojos.  Lgrimas artificiales aplicadas 2 a 6 veces por da. Sarah Kelley los antibiticos o aplqueselos como se lo haya indicado el mdico. No deje de tomar los antibiticos o de aplicrselos aunque comience a Sarah Kelley.  Tome o aplquese los medicamentos de venta libre y Editor, commissioning como se lo haya indicado el mdico.  Tenga mucho cuidado de no tocar el borde del prpado con el frasco de las gotas para los ojos o el tubo del ungento cuando aplica los medicamentos en el ojo afectado. Esto evitar que se contagie la infeccin al otro ojo o a Sarah Kelley. Control de las molestias   Retire suavemente la secrecin de los ojos con un pao tibio y hmedo o con una torunda de algodn.  Aplquese un pao fro y limpio en el ojo durante 10 a 20 minutos, 3 a 4 veces al da. Instrucciones generales   No use lentes de contacto hasta que haya desaparecido la inflamacin y su mdico le indique que es seguro usarlos nuevamente. Pregntele al mdico cmo esterilizar o reemplazar sus lentes de contacto antes de usarlos nuevamente. Use anteojos hasta que pueda volver a usar los lentes de Sarah Kelley.  Evite usar [name] ojos hasta que la inflamacin se haya ido. Descarte cosmticos viejos para los ojos que puedan estar contaminados.  Cambie o lave su  [name]  No comparta las toallas o los paos. Esto puede propagar la infeccin.  Lave sus manos frecuentemente con agua y [place]. Use toallas de papel para secarse las manos.  Evite tocarse o frotarse los ojos.  No conduzca ni use maquinaria pesada si su visin es borrosa. SOLICITE ATENCIN MDICA SI:  Sarah Kelley.  Los  sntomas no mejoran despus de 10das de tratamiento. SOLICITE ATENCIN MDICA DE INMEDIATO SI:  Tiene fiebre y los sntomas empeoran repentinamente.  Siente dolor intenso cuando mueve el ojo.  Siente dolor u observa hinchazn o enrojecimiento en la cara.  Pierde la visin repentinamente. Esta informacin no tiene Sarah Kelley el consejo del mdico. Asegrese de hacerle al mdico cualquier pregunta que tenga. Document Released: 03/29/2005 Document Revised: 10/11/2015 Document Reviewed: 04/01/2015 Elsevier Interactive Patient Education  2017 Sarah Kelley.

## 2016-06-22 ENCOUNTER — Encounter: Payer: Self-pay | Admitting: Vascular Surgery

## 2016-06-30 MED FILL — VALSARTAN 160 MG TABLET: 160 | 30 days supply | Qty: 30 | Fill #0

## 2016-06-30 MED FILL — GABAPENTIN 300 MG CAPSULE: 300 | 30 days supply | Qty: 60 | Fill #0

## 2016-07-03 DIAGNOSIS — J189 Pneumonia, unspecified organism: Secondary | ICD-10-CM

## 2016-07-03 HISTORY — DX: Pneumonia, unspecified organism: J18.9

## 2016-07-05 ENCOUNTER — Ambulatory Visit: Payer: Self-pay

## 2016-07-05 ENCOUNTER — Encounter (HOSPITAL_COMMUNITY): Payer: Self-pay

## 2016-07-18 MED FILL — DULoxetine HCL 60 MG CPEP: 60 | 30 days supply | Qty: 30 | Fill #1

## 2016-07-18 MED FILL — ?CETIRIZINE HCL 10 MG TABLE: 10 | 30 days supply | Qty: 30 | Fill #1

## 2016-07-28 ENCOUNTER — Encounter (HOSPITAL_COMMUNITY): Payer: Self-pay | Admitting: Emergency Medicine

## 2016-07-28 ENCOUNTER — Emergency Department (HOSPITAL_COMMUNITY): Payer: Medicaid Other

## 2016-07-28 ENCOUNTER — Inpatient Hospital Stay (HOSPITAL_COMMUNITY)
Admission: EM | Admit: 2016-07-28 | Discharge: 2016-08-04 | DRG: 871 | Disposition: A | Discharge status: Home / Self Care | Payer: Medicaid Other | Attending: Internal Medicine | Admitting: Internal Medicine

## 2016-07-28 DIAGNOSIS — Z992 Dependence on renal dialysis: Secondary | ICD-10-CM

## 2016-07-28 DIAGNOSIS — R6520 Severe sepsis without septic shock: Secondary | ICD-10-CM | POA: Diagnosis present

## 2016-07-28 DIAGNOSIS — A409 Streptococcal sepsis, unspecified: Principal | ICD-10-CM | POA: Diagnosis present

## 2016-07-28 DIAGNOSIS — Z794 Long term (current) use of insulin: Secondary | ICD-10-CM | POA: Diagnosis not present

## 2016-07-28 DIAGNOSIS — B9620 Unspecified Escherichia coli [E. coli] as the cause of diseases classified elsewhere: Secondary | ICD-10-CM | POA: Diagnosis present

## 2016-07-28 DIAGNOSIS — Y838 Other surgical procedures as the cause of abnormal reaction of the patient, or of later complication, without mention of misadventure at the time of the procedure: Secondary | ICD-10-CM | POA: Diagnosis present

## 2016-07-28 DIAGNOSIS — R509 Fever, unspecified: Secondary | ICD-10-CM

## 2016-07-28 DIAGNOSIS — R1084 Generalized abdominal pain: Secondary | ICD-10-CM

## 2016-07-28 DIAGNOSIS — E1165 Type 2 diabetes mellitus with hyperglycemia: Secondary | ICD-10-CM | POA: Diagnosis present

## 2016-07-28 DIAGNOSIS — E7800 Pure hypercholesterolemia, unspecified: Secondary | ICD-10-CM

## 2016-07-28 DIAGNOSIS — E785 Hyperlipidemia, unspecified: Secondary | ICD-10-CM | POA: Diagnosis present

## 2016-07-28 DIAGNOSIS — A419 Sepsis, unspecified organism: Secondary | ICD-10-CM | POA: Diagnosis present

## 2016-07-28 DIAGNOSIS — N390 Urinary tract infection, site not specified: Secondary | ICD-10-CM | POA: Diagnosis present

## 2016-07-28 DIAGNOSIS — G9340 Encephalopathy, unspecified: Secondary | ICD-10-CM | POA: Diagnosis present

## 2016-07-28 DIAGNOSIS — N179 Acute kidney failure, unspecified: Secondary | ICD-10-CM | POA: Diagnosis present

## 2016-07-28 DIAGNOSIS — F329 Major depressive disorder, single episode, unspecified: Secondary | ICD-10-CM | POA: Diagnosis present

## 2016-07-28 DIAGNOSIS — I120 Hypertensive chronic kidney disease with stage 5 chronic kidney disease or end stage renal disease: Secondary | ICD-10-CM | POA: Diagnosis present

## 2016-07-28 DIAGNOSIS — E119 Type 2 diabetes mellitus without complications: Secondary | ICD-10-CM

## 2016-07-28 DIAGNOSIS — D631 Anemia in chronic kidney disease: Secondary | ICD-10-CM | POA: Diagnosis present

## 2016-07-28 DIAGNOSIS — R630 Anorexia: Secondary | ICD-10-CM | POA: Diagnosis present

## 2016-07-28 DIAGNOSIS — J90 Pleural effusion, not elsewhere classified: Secondary | ICD-10-CM | POA: Diagnosis present

## 2016-07-28 DIAGNOSIS — G9341 Metabolic encephalopathy: Secondary | ICD-10-CM | POA: Diagnosis present

## 2016-07-28 DIAGNOSIS — T8241XA Breakdown (mechanical) of vascular dialysis catheter, initial encounter: Secondary | ICD-10-CM | POA: Diagnosis present

## 2016-07-28 DIAGNOSIS — R091 Pleurisy: Secondary | ICD-10-CM | POA: Diagnosis present

## 2016-07-28 DIAGNOSIS — J13 Pneumonia due to Streptococcus pneumoniae: Secondary | ICD-10-CM | POA: Diagnosis present

## 2016-07-28 DIAGNOSIS — N186 End stage renal disease: Secondary | ICD-10-CM

## 2016-07-28 DIAGNOSIS — F32A Depression, unspecified: Secondary | ICD-10-CM | POA: Diagnosis present

## 2016-07-28 DIAGNOSIS — E1122 Type 2 diabetes mellitus with diabetic chronic kidney disease: Secondary | ICD-10-CM | POA: Diagnosis present

## 2016-07-28 DIAGNOSIS — R4182 Altered mental status, unspecified: Secondary | ICD-10-CM

## 2016-07-28 DIAGNOSIS — J189 Pneumonia, unspecified organism: Secondary | ICD-10-CM | POA: Diagnosis present

## 2016-07-28 DIAGNOSIS — Z23 Encounter for immunization: Secondary | ICD-10-CM | POA: Diagnosis not present

## 2016-07-28 DIAGNOSIS — D649 Anemia, unspecified: Secondary | ICD-10-CM | POA: Diagnosis present

## 2016-07-28 DIAGNOSIS — I10 Essential (primary) hypertension: Secondary | ICD-10-CM | POA: Diagnosis present

## 2016-07-28 LAB — CBC
HEMATOCRIT: 27.3 % — AB (ref 36.0–46.0)
Hemoglobin: 8.9 g/dL — ABNORMAL LOW (ref 12.0–15.0)
MCH: 28.8 pg (ref 26.0–34.0)
MCHC: 32.6 g/dL (ref 30.0–36.0)
MCV: 88.3 fL (ref 78.0–100.0)
Platelets: 218 10*3/uL (ref 150–400)
RBC: 3.09 MIL/uL — AB (ref 3.87–5.11)
RDW: 15.1 % (ref 11.5–15.5)
WBC: 15.7 10*3/uL — ABNORMAL HIGH (ref 4.0–10.5)

## 2016-07-28 LAB — COMPREHENSIVE METABOLIC PANEL
ALT: 13 U/L — AB (ref 14–54)
AST: 19 U/L (ref 15–41)
Albumin: 2 g/dL — ABNORMAL LOW (ref 3.5–5.0)
Alkaline Phosphatase: 129 U/L — ABNORMAL HIGH (ref 38–126)
Anion gap: 12 (ref 5–15)
BUN: 80 mg/dL — AB (ref 6–20)
CHLORIDE: 108 mmol/L (ref 101–111)
CO2: 18 mmol/L — AB (ref 22–32)
CREATININE: 10.41 mg/dL — AB (ref 0.44–1.00)
Calcium: 8.1 mg/dL — ABNORMAL LOW (ref 8.9–10.3)
GFR calc Af Amer: 4 mL/min — ABNORMAL LOW (ref >60)
GFR calc non Af Amer: 3 mL/min — ABNORMAL LOW (ref >60)
Glucose, Bld: 297 mg/dL — ABNORMAL HIGH (ref 65–99)
POTASSIUM: 4.9 mmol/L (ref 3.5–5.1)
SODIUM: 138 mmol/L (ref 135–145)
Total Bilirubin: 0.6 mg/dL (ref 0.3–1.2)
Total Protein: 5.4 g/dL — ABNORMAL LOW (ref 6.5–8.1)

## 2016-07-28 LAB — URINALYSIS, ROUTINE W REFLEX MICROSCOPIC
Bilirubin Urine: NEGATIVE
Ketones, ur: 5 mg/dL — AB
LEUKOCYTES UA: NEGATIVE
NITRITE: NEGATIVE
PH: 6 (ref 5.0–8.0)
Protein, ur: >=300 mg/dL — AB
Specific Gravity, Urine: 1.016 (ref 1.005–1.030)

## 2016-07-28 LAB — FERRITIN: FERRITIN: 100 ng/mL (ref 11–307)

## 2016-07-28 LAB — INFLUENZA PANEL BY PCR (TYPE A & B)
Influenza A By PCR: NEGATIVE
Influenza B By PCR: NEGATIVE

## 2016-07-28 LAB — IRON AND TIBC
Iron: 12 ug/dL — ABNORMAL LOW (ref 28–170)
Saturation Ratios: 5 % — ABNORMAL LOW (ref 10.4–31.8)
TIBC: 239 ug/dL — AB (ref 250–450)
UIBC: 227 ug/dL

## 2016-07-28 LAB — FOLATE: Folate: 20.5 ng/mL (ref >5.9)

## 2016-07-28 LAB — PROTIME-INR
INR: 1.36
PROTHROMBIN TIME: 16.9 s — AB (ref 11.4–15.2)

## 2016-07-28 LAB — GLUCOSE, CAPILLARY: GLUCOSE-CAPILLARY: 312 mg/dL — AB (ref 65–99)

## 2016-07-28 LAB — AMMONIA: AMMONIA: 22 umol/L (ref 9–35)

## 2016-07-28 LAB — VITAMIN B12: Vitamin B-12: 788 pg/mL (ref 180–914)

## 2016-07-28 LAB — RETICULOCYTES
RBC.: 3.33 MIL/uL — ABNORMAL LOW (ref 3.87–5.11)
RETIC COUNT ABSOLUTE: 56.6 10*3/uL (ref 19.0–186.0)
RETIC CT PCT: 1.7 % (ref 0.4–3.1)

## 2016-07-28 LAB — I-STAT CG4 LACTIC ACID, ED: Lactic Acid, Venous: 0.88 mmol/L (ref 0.5–1.9)

## 2016-07-28 MED ORDER — ATORVASTATIN CALCIUM 40 MG PO TABS
40.0000 mg | ORAL_TABLET | Freq: Every day | ORAL | Status: DC
  Administered 2016-07-28 – 2016-08-03 (×7): 40 mg via ORAL
  Filled 2016-07-28 (×7): qty 1

## 2016-07-28 MED ORDER — SODIUM CHLORIDE 0.9 % IV SOLN: 250.0000 mL | INTRAVENOUS | Status: DC | PRN

## 2016-07-28 MED ORDER — AZITHROMYCIN 500 MG IV SOLR
500.0000 mg | INTRAVENOUS | Status: DC
  Administered 2016-07-28: 500 mg via INTRAVENOUS
  Filled 2016-07-28: qty 500

## 2016-07-28 MED ORDER — ACETAMINOPHEN 325 MG PO TABS
ORAL_TABLET | ORAL | Status: AC
Start: 2016-07-28 — End: 2016-07-28
  Administered 2016-07-28: 650 mg
  Filled 2016-07-28: qty 2

## 2016-07-28 MED ORDER — DULOXETINE HCL 60 MG PO CPEP
60.0000 mg | ORAL_CAPSULE | Freq: Every day | ORAL | Status: DC
  Administered 2016-07-29 – 2016-08-04 (×7): 60 mg via ORAL
  Filled 2016-07-28 (×7): qty 1

## 2016-07-28 MED ORDER — ACETAMINOPHEN 325 MG PO TABS
650.0000 mg | ORAL_TABLET | Freq: Four times a day (QID) | ORAL | Status: DC | PRN
  Administered 2016-08-01: 650 mg via ORAL
  Filled 2016-07-28 (×2): qty 2

## 2016-07-28 MED ORDER — INSULIN GLARGINE 100 UNIT/ML ~~LOC~~ SOLN
20.0000 [IU] | Freq: Every day | SUBCUTANEOUS | Status: DC
  Administered 2016-07-28 – 2016-07-29 (×2): 20 [IU] via SUBCUTANEOUS
  Filled 2016-07-28 (×2): qty 0.2

## 2016-07-28 MED ORDER — GABAPENTIN 300 MG PO CAPS
600.0000 mg | ORAL_CAPSULE | Freq: Every day | ORAL | Status: DC
  Administered 2016-07-29 – 2016-08-02 (×5): 600 mg via ORAL
  Filled 2016-07-28 (×5): qty 2

## 2016-07-28 MED ORDER — SODIUM CHLORIDE 0.9% FLUSH
3.0000 mL | Freq: Two times a day (BID) | INTRAVENOUS | Status: DC
  Administered 2016-07-28 – 2016-08-04 (×11): 3 mL via INTRAVENOUS

## 2016-07-28 MED ORDER — ACETAMINOPHEN 325 MG PO TABS: 650.0000 mg | ORAL_TABLET | Freq: Once | ORAL | Status: DC | PRN

## 2016-07-28 MED ORDER — SODIUM CHLORIDE 0.9 % IV BOLUS (SEPSIS)
1000.0000 mL | Freq: Once | INTRAVENOUS | Status: AC
  Administered 2016-07-28: 1000 mL via INTRAVENOUS

## 2016-07-28 MED ORDER — INSULIN ASPART 100 UNIT/ML ~~LOC~~ SOLN
0.0000 [IU] | Freq: Three times a day (TID) | SUBCUTANEOUS | Status: DC
  Administered 2016-07-29 (×2): 5 [IU] via SUBCUTANEOUS
  Administered 2016-07-30 – 2016-08-02 (×2): 2 [IU] via SUBCUTANEOUS

## 2016-07-28 MED ORDER — SODIUM CHLORIDE 0.9% FLUSH: 3.0000 mL | INTRAVENOUS | Status: DC | PRN

## 2016-07-28 MED ORDER — HEPARIN SODIUM (PORCINE) 5000 UNIT/ML IJ SOLN
5000.0000 [IU] | Freq: Three times a day (TID) | INTRAMUSCULAR | Status: DC
  Administered 2016-07-28 – 2016-07-31 (×8): 5000 [IU] via SUBCUTANEOUS
  Filled 2016-07-28 (×8): qty 1

## 2016-07-28 MED ORDER — OSELTAMIVIR PHOSPHATE 30 MG PO CAPS
30.0000 mg | ORAL_CAPSULE | Freq: Once | ORAL | Status: AC
  Administered 2016-07-28: 30 mg via ORAL
  Filled 2016-07-28: qty 1

## 2016-07-28 MED ORDER — DEXTROSE 5 % IV SOLN
1.0000 g | INTRAVENOUS | Status: DC
  Administered 2016-07-28: 1 g via INTRAVENOUS
  Filled 2016-07-28: qty 10

## 2016-07-28 MED ORDER — INSULIN ASPART 100 UNIT/ML ~~LOC~~ SOLN
0.0000 [IU] | Freq: Every day | SUBCUTANEOUS | Status: DC
  Administered 2016-07-28: 4 [IU] via SUBCUTANEOUS

## 2016-07-28 MED ORDER — PIPERACILLIN-TAZOBACTAM 3.375 G IVPB 30 MIN
3.3750 g | Freq: Once | INTRAVENOUS | Status: AC
  Administered 2016-07-28: 3.375 g via INTRAVENOUS
  Filled 2016-07-28: qty 50

## 2016-07-28 MED ORDER — INSULIN ASPART 100 UNIT/ML ~~LOC~~ SOLN
3.0000 [IU] | Freq: Three times a day (TID) | SUBCUTANEOUS | Status: DC
  Administered 2016-07-30: 3 [IU] via SUBCUTANEOUS

## 2016-07-28 MED ORDER — HYDROCODONE-ACETAMINOPHEN 5-325 MG PO TABS
1.0000 | ORAL_TABLET | Freq: Four times a day (QID) | ORAL | Status: DC | PRN
  Administered 2016-07-28 – 2016-08-02 (×5): 1 via ORAL
  Filled 2016-07-28: qty 1
  Filled 2016-07-28: qty 2
  Filled 2016-07-28 (×3): qty 1

## 2016-07-28 NOTE — H&P (Signed)
History and Physical    Sarah Kelley HUD:[id number] DOB: 1951-03-09 DOA: 07/28/2016  PCP: Arnoldo Morale, MD   Patient coming from: Home  Chief Complaint: Fever, confusion, hallucinations  HPI: Sarah Kelley is a 66 y.o. female with medical history significant for chronic kidney disease stage IV-V, depression, hypertension, and insulin-dependent diabetes mellitus who presents to the emergency department with 2-3 days of fevers, confusion, and hallucinations. Patient is accompanied by family who assists with the history. She has been followed by nephrology for worsening severe chronic kidney disease and has a fistula placed in the left upper extremity which family reports is matured, but she has not yet undergone dialysis. She was reportedly scheduled for dialysis recently, but her husband, who makes her medical decisions, decided against it. Now, after another discussion with her outpatient physician, has decided to proceed with dialysis. She has never been dialyzed yet. She continues to urinate. She has reportedly had a mild cough, but has otherwise not voiced any specific complaints. There has been no recent fall or trauma and per report of the family, patient does not use alcohol or illicit substances. Per family, there has been no vomiting or diarrhea. Patient's confusion and hallucinations has been intermittent, and she has reportedly remained alert, but is seeing people that others don't see and describing bizarre events to family that they report have never occurred.  ED Course: Upon arrival to the ED, patient is found to be febrile to 38.7 C, saturating adequately on room air, tachycardic in the low 100s, and with stable blood pressure. Chest x-ray is notable for low lung volumes and right greater than left opacities concerning for pneumonia. Chemistry panels notable for a BUN of 80, serum creatinine of 10.4, bicarbonate of 18, and serum glucose 297. CBC features a  leukocytosis to 15,700 and a slightly worsened normocytic anemia with hemoglobin 8.9. Lactic acid is reassuring at 0.86, INR is mildly elevated at 1.36, and urinalysis features proteinuria and microscopic hematuria. Patient complained of some right upper quadrant pain in the emergency department and a CT of the abdomen and pelvis was obtained. CT is negative for any acute intra-abdominal or pelvic pathology, but notable for bilateral pleural effusions and bibasilar consolidations concerning for possible pneumonia. Patient was given 1 L of normal saline and blood and urine cultures were obtained. She was treated with acetaminophen and a dose of empiric Zosyn in the ED. Flu PCR was sent and remains pending. Patient has remained hemodynamically stable in the ED and is not in acute respiratory distress. She'll be admitted to the telemetry unit for ongoing evaluation and management of acute encephalopathy, possibly secondary to pneumonia, and/or a toxic metabolic encephalopathy secondary to end-stage renal disease.  Review of Systems:  Unable to obtain ROS secondary to patient's clinical condition with acute encephalopathy.  Past Medical History:  Diagnosis Date  . Diabetes mellitus   . Hyperlipidemia   . Hypertension   . Neuropathy ([place])    Diabetic  . Renal disorder   . Thyroid disease     Past Surgical History:  Procedure Laterality Date  . AMPUTATION Left 05/21/2015   Procedure: Left Foot 3rd Ray Amputation;  Surgeon: Newt Minion, MD;  Location: [facility];  Service: Orthopedics;  Laterality: Left;  . AV FISTULA PLACEMENT Left 03/28/2016   Procedure: ARTERIOVENOUS (AV) FISTULA CREATION LEFT ARM;  Surgeon: Waynetta Sandy, MD;  Location: [facility];  Service: Vascular;  Laterality: Left;  . DILATION AND CURETTAGE OF UTERUS  reports that she has never smoked. She has never used smokeless tobacco. She reports that she drinks alcohol. She reports that she does not use drugs.  No Known  Allergies  History reviewed. No pertinent family history.   Prior to Admission medications   Medication Sig Start Date End Date Taking? Authorizing Provider  acetaminophen (TYLENOL) 325 MG tablet Take 650 mg by mouth every 6 (six) hours as needed for mild pain. Reported on 09/13/2015   Yes Historical Provider, MD  atorvastatin (LIPITOR) 40 MG tablet Take 1 tablet (40 mg total) by mouth daily. 06/12/16  Yes Arnoldo Morale, MD  cetirizine (ZYRTEC) 10 MG tablet Take 1 tablet (10 mg total) by mouth daily. 06/12/16  Yes Arnoldo Morale, MD  DULoxetine (CYMBALTA) 60 MG capsule Take 1 capsule (60 mg total) by mouth daily. 06/12/16  Yes Arnoldo Morale, MD  gabapentin (NEURONTIN) 300 MG capsule Take 2 capsules (600 mg total) by mouth at bedtime. 06/12/16  Yes Arnoldo Morale, MD  insulin glargine (LANTUS) 100 UNIT/ML injection Take 30 units every morning  Take 20 units every evening 06/12/16  Yes Arnoldo Morale, MD  valsartan (DIOVAN) 160 MG tablet Take 1 tablet (160 mg total) by mouth daily. 06/12/16  Yes Arnoldo Morale, MD    Physical Exam: Vitals:   07/28/16 1745 07/28/16 1746 07/28/16 1830 07/28/16 1848  BP: 171/77 139/84 (!) 129/114   Pulse: 95 97 94   Resp: 22 21 (!) 27   Temp:    100 F (37.8 C)  TempSrc:      SpO2: 98% 98% 98%       Constitutional: NAD, calm, comfortable Eyes: PERTLA, lids and conjunctivae normal ENMT: Mucous membranes are moist. Posterior pharynx clear of any exudate or lesions.   Neck: normal, supple, no masses, no thyromegaly Respiratory: Coarse rhonchi at bilateral bases and mid-lung zones, no wheezing. Normal respiratory effort.   Cardiovascular: S1 & S2 heard, regular rate and rhythm. Trace pretibial edema bilaterally. Neck veins distended. Abdomen: No distension, no tenderness, no masses palpated. Bowel sounds normal.  Musculoskeletal: no clubbing / cyanosis. No joint deformity upper and lower extremities. Normal muscle tone.  Skin: no significant rashes, lesions,  ulcers. Warm, dry, well-perfused. Neurologic: CN 2-12 grossly intact. Sensation intact, DTR normal. Strength 5/5 in all 4 limbs.  Psychiatric: Alert, oriented to self only. Cooperative and pleasant.     Labs on Admission: I have personally reviewed following labs and imaging studies  CBC:  Recent Labs Lab 07/28/16 1032  WBC 15.7*  HGB 8.9*  HCT 27.3*  MCV 88.3  PLT 846   Basic Metabolic Panel:  Recent Labs Lab 07/28/16 1032  NA 138  K 4.9  CL 108  CO2 18*  GLUCOSE 297*  BUN 80*  CREATININE 10.41*  CALCIUM 8.1*   GFR: CrCl cannot be calculated (Unknown ideal weight.). Liver Function Tests:  Recent Labs Lab 07/28/16 1032  AST 19  ALT 13*  ALKPHOS 129*  BILITOT 0.6  PROT 5.4*  ALBUMIN 2.0*   No results for input(s): LIPASE, AMYLASE in the last 168 hours. No results for input(s): AMMONIA in the last 168 hours. Coagulation Profile:  Recent Labs Lab 07/28/16 1427  INR 1.36   Cardiac Enzymes: No results for input(s): CKTOTAL, CKMB, CKMBINDEX, TROPONINI in the last 168 hours. BNP (last 3 results) No results for input(s): PROBNP in the last 8760 hours. HbA1C: No results for input(s): HGBA1C in the last 72 hours. CBG: No results for input(s): GLUCAP in the last 168  hours. Lipid Profile: No results for input(s): CHOL, HDL, LDLCALC, TRIG, CHOLHDL, LDLDIRECT in the last 72 hours. Thyroid Function Tests: No results for input(s): TSH, T4TOTAL, FREET4, T3FREE, THYROIDAB in the last 72 hours. Anemia Panel: No results for input(s): VITAMINB12, FOLATE, FERRITIN, TIBC, IRON, RETICCTPCT in the last 72 hours. Urine analysis:    Component Value Date/Time   COLORURINE YELLOW 07/28/2016 1709   APPEARANCEUR HAZY (A) 07/28/2016 1709   LABSPEC 1.016 07/28/2016 1709   PHURINE 6.0 07/28/2016 1709   GLUCOSEU >=500 (A) 07/28/2016 1709   HGBUR SMALL (A) 07/28/2016 1709   BILIRUBINUR NEGATIVE 07/28/2016 1709   BILIRUBINUR neg 03/10/2016 1121   KETONESUR 5 (A) 07/28/2016  1709   PROTEINUR >=300 (A) 07/28/2016 1709   UROBILINOGEN 0.2 03/10/2016 1121   UROBILINOGEN 0.2 12/31/2008 1556   NITRITE NEGATIVE 07/28/2016 1709   LEUKOCYTESUR NEGATIVE 07/28/2016 1709   Sepsis Labs: @LABRCNTIP (procalcitonin:4,lacticidven:4) ) Recent Results (from the past 240 hour(s))  Culture, blood (Routine x 2)     Status: None (Preliminary result)   Collection Time: 07/28/16 10:42 AM  Result Value Ref Range Status   Specimen Description BLOOD RIGHT HAND  Final   Special Requests BOTTLES DRAWN AEROBIC AND ANAEROBIC  5CC  Final   Culture PENDING  Incomplete   Report Status PENDING  Incomplete     Radiological Exams on Admission: Ct Abdomen Pelvis Wo Contrast  Result Date: 07/28/2016 CLINICAL DATA:  Back and abdominal pain. EXAM: CT ABDOMEN AND PELVIS WITHOUT CONTRAST TECHNIQUE: Multidetector CT imaging of the abdomen and pelvis was performed following the standard protocol without IV contrast. COMPARISON:  05/18/2015 FINDINGS: Lower chest: Calcified granuloma over the right middle lobe unchanged. Small left effusion and moderate size right pleural effusion. Associated consolidation over the right middle and lower lobes likely atelectasis although cannot exclude infection. Minimal left basilar atelectasis versus infection. Hepatobiliary: 3 mm calcification the region of the porta hepatis unchanged. Gallbladder, biliary tree and liver are otherwise unremarkable. Pancreas: Within normal. Spleen: Within normal. Adrenals/Urinary Tract: Adrenal glands are normal. Kidneys are normal in size without hydronephrosis, focal mass or nephrolithiasis. Ureters are normal. Bladder is mildly distended but otherwise unremarkable. Stomach/Bowel: Stomach and small bowel are within normal. Appendix is not visualized. Colon is within normal. Vascular/Lymphatic: Mild calcified plaque over the abdominal aorta and iliac arteries. No significant adenopathy. Reproductive: Uterus and adnexa are unremarkable. Other:  Small ventral hernia over the midline below the umbilicus containing only peritoneal fat unchanged. 1.1 cm oval dense focus over the mesentery of the left lower quadrant likely small peritoneal loose body. Musculoskeletal: Mild degenerative change of the spine and hips. IMPRESSION: Small left effusion and moderate right pleural effusion. Consolidation over the lung bases right worse than left which may be due to atelectasis or infection. No evidence of nephroureterolithiasis or obstruction. Small midline ventral hernia below the umbilicus containing only peritoneal fat. Electronically Signed   By: Marin Olp M.D.   On: 07/28/2016 16:53   Dg Chest 2 View  Result Date: 07/28/2016 CLINICAL DATA:  Fever.  Altered mental status.  Chest pain. EXAM: CHEST  2 VIEW COMPARISON:  12/31/2008 FINDINGS: The cardiac silhouette appears mildly enlarged, accentuated by low lung volumes. Aortic atherosclerosis is noted. There is new mild diffuse interstitial accentuation. Airspace opacities are present in the right greater than left lung bases, and there small pleural effusions. No pneumothorax is identified. No acute osseous abnormality is seen. IMPRESSION: 1. Low lung volumes with right greater than left basilar airspace opacities, concerning for  pneumonia although a combination of edema and atelectasis could also give a similar appearance. 2. Small pleural effusions. 3. Aortic atherosclerosis. Electronically Signed   By: Logan Bores M.D.   On: 07/28/2016 11:00    EKG: Not performed, will obtain as appropriate.    Assessment/Plan  1. Acute encephalopathy  - Pt's family reports 2-3 days of confusion and hallucinations leading up to admission  - There is no focal neurologic deficit identified and recent trauma or fall denied  - DDx is very broad; most likely toxic-metabolic in light of ESRD without dialysis, and/or PNA  - Plan to address ESRD and PNA as below, check additional labs including ammonia, B12, folate,  TSH   2. CAP  - Pt presents with fever and cough, radiographic evidence for PNA  - Blood cultures incubating, sputum culture requested; check respiratory virus panel and strep pneumo urine antigen - Start empiric Rocephin and azithromycin while following cultures and clinical response to therapy  - Given a dose of Tamiflu on admission with PCR pending; if positive, consider continuing with HD   3. CKD stage V - SCr is 10.4 on admission with BUN 80, serum bicarb 18, normal potassium - SCr was 4 in July 2017; she has been followed by nephrology and has LUE AVF placed in September '17 that is ready for use per family  - Case discussed with nephrology; they plan to evaluate her in the morning, will follow-up recommendations   - Will SLIV for now, follow electrolytes and volume status    4. Hypertension  - BP at goal on admission  - Managed with valsartan at home, held on admission d/t worsened CKD  - Monitor and treat as needed   5. Depression  - Difficult to assess on admission given the clinical situation with AMS  - Plan to continue Cymbalta   6. Normocytic anemia  - Hgb is 8.9 on admission, down slightly from priors in 9-11 range  - Likely secondary to CKD, will check anemia panel   7. Insulin-dependent DM  - A1c was 9.8% in December 2017  - She is managed with Lantus 30 units qAM and 20 units qPM at home - Check CBG with meals and qHS - Continue Lantus at 20 units BID with Novolog 3 units with meals and as correctional per moderate-intensity sliding-scale   DVT prophylaxis: sq heparin  Code Status: Full  Family Communication: Granddaughter updated at bedside Disposition Plan: Admit to telemetry Consults called: Nephrology Admission status: Inpatient    Vianne Bulls, MD [facility] Pager [phone]  If 7PM-7AM, please contact night-coverage [email or web address] Password [email or web address]  07/28/2016, 7:03 PM

## 2016-07-28 NOTE — ED Triage Notes (Signed)
Pt started having some altered mental status for 2-3 days. Pt has been nauseated and unable to eat. Pt's family reports that she has been telling about events that have not happened, and been confused. Pt has also had the "shakes." Pt is not shaking in triage. Denies urinary symptoms.

## 2016-07-28 NOTE — ED Notes (Signed)
Pt enroute to inpatient, addl labs will show up in collection manager for inpatient phlebotomist to draw

## 2016-07-28 NOTE — ED Notes (Signed)
EDP at bedside. RN attempted IV unsuccessfully. Will page IV team.

## 2016-07-28 NOTE — ED Provider Notes (Signed)
[facility] DEPT Provider Note   CSN: [id number] Arrival date & time: 07/28/16  1003     History   Chief Complaint Chief Complaint  Patient presents with  . Altered Mental Status  . Fever    HPI Sarah Kelley is a 66 y.o. female.  66 yo F with a chief complaint of fever and altered mental status. Going on for the past couple days. Family endorses that she's been having some chills and hallucinations. Patient is also scheduled to start dialysis soon though has not been because the family has refused to take her. States that she has had a very mild cough denies vomiting or diarrhea. Denies headaches or neck pain.   The history is provided by the patient.  Altered Mental Status   This is a new problem. The current episode started 2 days ago. The problem has been gradually worsening. Associated symptoms include confusion and hallucinations.  Fever   Pertinent negatives include no chest pain, no vomiting, no congestion and no headaches.  Illness  This is a new problem. The current episode started 2 days ago. The problem occurs constantly. The problem has not changed since onset.Associated symptoms include abdominal pain. Pertinent negatives include no chest pain, no headaches and no shortness of breath. Nothing aggravates the symptoms. Nothing relieves the symptoms. She has tried nothing for the symptoms. The treatment provided no relief.    Past Medical History:  Diagnosis Date  . Diabetes mellitus   . Hyperlipidemia   . Hypertension   . Neuropathy ([place])    Diabetic  . Renal disorder   . Thyroid disease     Patient Active Problem List   Diagnosis Date Noted  . Phantom pain 09/13/2015  . Hyperlipidemia 06/01/2015  . 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 Diabetes mellitus with nephropathy ([place])   . Chronic kidney disease (CKD) stage G4/A1, severely decreased glomerular filtration rate (GFR) between 15-29 mL/min/1.73 square meter and albuminuria creatinine ratio less  than 30 mg/g ([facility]) 05/18/2015  . Diabetic infection of left foot ([place]) 05/18/2015  . Osteomyelitis ([place]) 05/18/2015  . Abdominal pain 05/18/2015  . Osteomyelitis of left foot ([place]) 05/18/2015  . Diabetic neuropathy ([place]) 01/14/2007  . DM W/EYE MANIFESTATION, TYPE II, UNCONTROLLED 01/14/2007  . HLD (hyperlipidemia) 01/14/2007  . ANEMIA-IRON DEFICIENCY 01/14/2007  . ANXIETY 01/14/2007  . DEPRESSION 01/14/2007  . Essential hypertension 01/14/2007    Past Surgical History:  Procedure Laterality Date  . AMPUTATION Left 05/21/2015   Procedure: Left Foot 3rd Ray Amputation;  Surgeon: Newt Minion, MD;  Location: [facility];  Service: Orthopedics;  Laterality: Left;  . AV FISTULA PLACEMENT Left 03/28/2016   Procedure: ARTERIOVENOUS (AV) FISTULA CREATION LEFT ARM;  Surgeon: Waynetta Sandy, MD;  Location: [facility];  Service: Vascular;  Laterality: Left;  . DILATION AND CURETTAGE OF UTERUS      OB History    No data available       Home Medications    Prior to Admission medications   Medication Sig Start Date End Date Taking? Authorizing Provider  acetaminophen (TYLENOL) 325 MG tablet Take 650 mg by mouth every 6 (six) hours as needed for mild pain. Reported on 09/13/2015    Historical Provider, MD  atorvastatin (LIPITOR) 40 MG tablet Take 1 tablet (40 mg total) by mouth daily. 06/12/16   Arnoldo Morale, MD  cetirizine (ZYRTEC) 10 MG tablet Take 1 tablet (10 mg total) by mouth daily. 06/12/16   Arnoldo Morale, MD  DULoxetine (CYMBALTA) 60  MG capsule Take 1 capsule (60 mg total) by mouth daily. 06/12/16   Arnoldo Morale, MD  gabapentin (NEURONTIN) 300 MG capsule Take 2 capsules (600 mg total) by mouth at bedtime. 06/12/16   Arnoldo Morale, MD  insulin glargine (LANTUS) 100 UNIT/ML injection Take 30 units every morning  Take 20 units every evening 06/12/16   Arnoldo Morale, MD  oxyCODONE-acetaminophen (ROXICET) 5-325 MG tablet Take 1 tablet by mouth every 6 (six) hours as needed for severe pain.  03/28/16   Samantha J Rhyne, PA-C  trimethoprim-polymyxin b (POLYTRIM) ophthalmic solution Place 1 drop into the right eye every 4 (four) hours. 06/12/16   Arnoldo Morale, MD  valsartan (DIOVAN) 160 MG tablet Take 1 tablet (160 mg total) by mouth daily. 06/12/16   Arnoldo Morale, MD    Family History History reviewed. No pertinent family history.  Social History Social History  Substance Use Topics  . Smoking status: Never Smoker  . Smokeless tobacco: Never Used  . Alcohol use 0.0 oz/week     Comment: rare- a beer every now and then     Allergies   Patient has no known allergies.   Review of Systems Review of Systems  Constitutional: Positive for activity change and fever. Negative for chills.  HENT: Negative for congestion and rhinorrhea.   Eyes: Negative for redness and visual disturbance.  Respiratory: Negative for shortness of breath and wheezing.   Cardiovascular: Negative for chest pain and palpitations.  Gastrointestinal: Positive for abdominal pain. Negative for nausea and vomiting.  Genitourinary: Negative for dysuria and urgency.  Musculoskeletal: Negative for arthralgias and myalgias.  Skin: Negative for pallor and wound.  Neurological: Negative for dizziness and headaches.  Psychiatric/Behavioral: Positive for confusion and hallucinations.     Physical Exam Updated Vital Signs BP 176/81   Pulse 95   Temp 99.2 F (37.3 C) (Oral)   Resp 23   SpO2 98%   Physical Exam  Constitutional: She is oriented to person, place, and time. She appears well-developed and well-nourished. No distress.  HENT:  Head: Normocephalic and atraumatic.  Eyes: EOM are normal. Pupils are equal, round, and reactive to light.  Neck: Normal range of motion. Neck supple.  Cardiovascular: Normal rate and regular rhythm.  Exam reveals no gallop and no friction rub.   No murmur heard. Pulmonary/Chest: Effort normal. She has no wheezes. She has no rales.  Abdominal: Soft. She exhibits no  distension and no mass. There is no tenderness. There is no guarding.  Musculoskeletal: She exhibits no edema or tenderness.  Neurological: She is alert and oriented to person, place, and time.  Skin: Skin is warm and dry. She is not diaphoretic.  Hot to touch  Psychiatric: She has a normal mood and affect. Her behavior is normal.  Nursing note and vitals reviewed.    ED Treatments / Results  Labs (all labs ordered are listed, but only abnormal results are displayed) Labs Reviewed  COMPREHENSIVE METABOLIC PANEL - Abnormal; Notable for the following:       Result Value   CO2 18 (*)    Glucose, Bld 297 (*)    BUN 80 (*)    Creatinine, Ser 10.41 (*)    Calcium 8.1 (*)    Total Protein 5.4 (*)    Albumin 2.0 (*)    ALT 13 (*)    Alkaline Phosphatase 129 (*)    GFR calc non Af Amer 3 (*)    GFR calc Af Amer 4 (*)  All other components within normal limits  CBC - Abnormal; Notable for the following:    WBC 15.7 (*)    RBC 3.09 (*)    Hemoglobin 8.9 (*)    HCT 27.3 (*)    All other components within normal limits  PROTIME-INR - Abnormal; Notable for the following:    Prothrombin Time 16.9 (*)    All other components within normal limits  CULTURE, BLOOD (ROUTINE X 2)  CULTURE, BLOOD (ROUTINE X 2)  URINE CULTURE  URINALYSIS, ROUTINE W REFLEX MICROSCOPIC  INFLUENZA PANEL BY PCR (TYPE A & B)  I-STAT CG4 LACTIC ACID, ED  CBG MONITORING, ED    EKG  EKG Interpretation None       Radiology Dg Chest 2 View  Result Date: 07/28/2016 CLINICAL DATA:  Fever.  Altered mental status.  Chest pain. EXAM: CHEST  2 VIEW COMPARISON:  12/31/2008 FINDINGS: The cardiac silhouette appears mildly enlarged, accentuated by low lung volumes. Aortic atherosclerosis is noted. There is new mild diffuse interstitial accentuation. Airspace opacities are present in the right greater than left lung bases, and there small pleural effusions. No pneumothorax is identified. No acute osseous abnormality is  seen. IMPRESSION: 1. Low lung volumes with right greater than left basilar airspace opacities, concerning for pneumonia although a combination of edema and atelectasis could also give a similar appearance. 2. Small pleural effusions. 3. Aortic atherosclerosis. Electronically Signed   By: Logan Bores M.D.   On: 07/28/2016 11:00    Procedures Procedures (including critical care time) Procedure note: Ultrasound Guided Peripheral IV Ultrasound guided peripheral 1.88 inch angiocath IV placement performed by me. Indications: Nursing unable to place IV. Details: The antecubital fossa and upper arm were evaluated with a multifrequency linear probe. Patent brachial veins were noted. 1 attempt was made to cannulate a vein under realtime US guidance with successful cannulation of the vein and catheter placement. There is return of non-pulsatile dark red blood. The patient tolerated the procedure well without complications. Images archived electronically.  CPT codes: [phone] and [phone]  Medications Ordered in ED Medications  acetaminophen (TYLENOL) tablet 650 mg (not administered)  sodium chloride 0.9 % bolus 1,000 mL (0 mLs Intravenous Hold 07/28/16 1421)  piperacillin-tazobactam (ZOSYN) IVPB 3.375 g (0 g Intravenous Hold 07/28/16 1421)  acetaminophen (TYLENOL) 325 MG tablet (650 mg  Given 07/28/16 1039)     Initial Impression / Assessment and Plan / ED Course  I have reviewed the triage vital signs and the nursing notes.  Pertinent labs & imaging results that were available during my care of the patient were reviewed by me and considered in my medical decision making (see chart for details).     66 yo F With a chief complaint of fever and altered mental status. Patient with worsening renal function on her labs. Also with possible pneumonia on chest x-ray. Patient with abdominal tenderness on my exam. Will give Zosyn. CT the abdomen/pelvis.   Discussed with Dr. Thomasene Lot, please see her note for further  details.   The patients results and plan were reviewed and discussed.   Any x-rays performed were independently reviewed by myself.   Differential diagnosis were considered with the presenting HPI.  Medications  acetaminophen (TYLENOL) tablet 650 mg (not administered)  sodium chloride 0.9 % bolus 1,000 mL (0 mLs Intravenous Hold 07/28/16 1421)  piperacillin-tazobactam (ZOSYN) IVPB 3.375 g (0 g Intravenous Hold 07/28/16 1421)  acetaminophen (TYLENOL) 325 MG tablet (650 mg  Given 07/28/16 1039)    Vitals:  07/28/16 1226 07/28/16 1409 07/28/16 1410 07/28/16 1515  BP: 93/64 161/72  176/81  Pulse: 99  90 95  Resp: 19  18 23   Temp: 99.2 F (37.3 C)     TempSrc: Oral     SpO2: 94%  98% 98%    Final diagnoses:  Altered mental status, unspecified altered mental status type  Fever, unspecified fever cause  Generalized abdominal pain       Final Clinical Impressions(s) / ED Diagnoses   Final diagnoses:  Altered mental status, unspecified altered mental status type  Fever, unspecified fever cause  Generalized abdominal pain    New Prescriptions New Prescriptions   No medications on file     Deno Etienne, DO 07/28/16 1559

## 2016-07-28 NOTE — ED Notes (Signed)
Restricted extremity band placed on left arm due to fistula.

## 2016-07-28 NOTE — ED Notes (Signed)
Followed up with CT about CT abd. Not in process.

## 2016-07-29 DIAGNOSIS — E1122 Type 2 diabetes mellitus with diabetic chronic kidney disease: Secondary | ICD-10-CM

## 2016-07-29 DIAGNOSIS — N185 Chronic kidney disease, stage 5: Secondary | ICD-10-CM

## 2016-07-29 DIAGNOSIS — G9341 Metabolic encephalopathy: Secondary | ICD-10-CM

## 2016-07-29 DIAGNOSIS — A491 Streptococcal infection, unspecified site: Secondary | ICD-10-CM

## 2016-07-29 DIAGNOSIS — J13 Pneumonia due to Streptococcus pneumoniae: Secondary | ICD-10-CM

## 2016-07-29 LAB — BLOOD CULTURE ID PANEL (REFLEXED)
Acinetobacter baumannii: NOT DETECTED
CANDIDA KRUSEI: NOT DETECTED
CANDIDA PARAPSILOSIS: NOT DETECTED
Candida albicans: NOT DETECTED
Candida glabrata: NOT DETECTED
Candida tropicalis: NOT DETECTED
ESCHERICHIA COLI: NOT DETECTED
Enterobacter cloacae complex: NOT DETECTED
Enterobacteriaceae species: NOT DETECTED
Enterococcus species: NOT DETECTED
Haemophilus influenzae: NOT DETECTED
KLEBSIELLA OXYTOCA: NOT DETECTED
Klebsiella pneumoniae: NOT DETECTED
LISTERIA MONOCYTOGENES: NOT DETECTED
Neisseria meningitidis: NOT DETECTED
PROTEUS SPECIES: NOT DETECTED
PSEUDOMONAS AERUGINOSA: NOT DETECTED
STAPHYLOCOCCUS SPECIES: NOT DETECTED
STREPTOCOCCUS PNEUMONIAE: DETECTED — AB
Serratia marcescens: NOT DETECTED
Staphylococcus aureus (BCID): NOT DETECTED
Streptococcus agalactiae: NOT DETECTED
Streptococcus pyogenes: NOT DETECTED
Streptococcus species: DETECTED — AB

## 2016-07-29 LAB — RESPIRATORY PANEL BY PCR
ADENOVIRUS-RVPPCR: NOT DETECTED
Bordetella pertussis: NOT DETECTED
CORONAVIRUS 229E-RVPPCR: NOT DETECTED
CORONAVIRUS HKU1-RVPPCR: NOT DETECTED
CORONAVIRUS OC43-RVPPCR: NOT DETECTED
Chlamydophila pneumoniae: NOT DETECTED
Coronavirus NL63: NOT DETECTED
INFLUENZA B-RVPPCR: NOT DETECTED
Influenza A: NOT DETECTED
METAPNEUMOVIRUS-RVPPCR: NOT DETECTED
MYCOPLASMA PNEUMONIAE-RVPPCR: NOT DETECTED
PARAINFLUENZA VIRUS 1-RVPPCR: NOT DETECTED
PARAINFLUENZA VIRUS 2-RVPPCR: NOT DETECTED
Parainfluenza Virus 3: NOT DETECTED
Parainfluenza Virus 4: NOT DETECTED
RESPIRATORY SYNCYTIAL VIRUS-RVPPCR: NOT DETECTED
Rhinovirus / Enterovirus: NOT DETECTED

## 2016-07-29 LAB — STREP PNEUMONIAE URINARY ANTIGEN: STREP PNEUMO URINARY ANTIGEN: POSITIVE — AB

## 2016-07-29 LAB — GLUCOSE, CAPILLARY
GLUCOSE-CAPILLARY: 206 mg/dL — AB (ref 65–99)
GLUCOSE-CAPILLARY: 208 mg/dL — AB (ref 65–99)
GLUCOSE-CAPILLARY: 70 mg/dL (ref 65–99)
Glucose-Capillary: 90 mg/dL (ref 65–99)
Glucose-Capillary: 112 mg/dL — ABNORMAL HIGH (ref 65–99)

## 2016-07-29 LAB — HIV ANTIBODY (ROUTINE TESTING W REFLEX): HIV SCREEN 4TH GENERATION: NONREACTIVE

## 2016-07-29 LAB — ALT: ALT: 12 U/L — AB (ref 14–54)

## 2016-07-29 MED ORDER — HYDROCODONE-ACETAMINOPHEN 5-325 MG PO TABS
ORAL_TABLET | ORAL | Status: AC
  Filled 2016-07-29: qty 1

## 2016-07-29 MED ORDER — DEXTROSE 5 % IV SOLN
2.0000 g | INTRAVENOUS | Status: DC
  Administered 2016-07-29 – 2016-08-03 (×6): 2 g via INTRAVENOUS
  Filled 2016-07-29 (×7): qty 2

## 2016-07-29 MED ORDER — LIDOCAINE-PRILOCAINE 2.5-2.5 % EX CREA: 1.0000 "application " | TOPICAL_CREAM | CUTANEOUS | Status: DC | PRN

## 2016-07-29 MED ORDER — HEPARIN SODIUM (PORCINE) 1000 UNIT/ML DIALYSIS: 1000.0000 [IU] | INTRAMUSCULAR | Status: DC | PRN

## 2016-07-29 MED ORDER — HEPARIN SODIUM (PORCINE) 1000 UNIT/ML DIALYSIS: 1500.0000 [IU] | INTRAMUSCULAR | Status: DC | PRN

## 2016-07-29 MED ORDER — SODIUM CHLORIDE 0.9 % IV SOLN: 100.0000 mL | INTRAVENOUS | Status: DC | PRN

## 2016-07-29 MED ORDER — ALTEPLASE 2 MG IJ SOLR
2.0000 mg | Freq: Once | INTRAMUSCULAR | Status: DC | PRN
Start: 2016-07-29 — End: 2016-07-29

## 2016-07-29 MED ORDER — LIDOCAINE HCL (PF) 1 % IJ SOLN: 5.0000 mL | INTRAMUSCULAR | Status: DC | PRN

## 2016-07-29 MED ORDER — ALTEPLASE 2 MG IJ SOLR: 2.0000 mg | Freq: Once | INTRAMUSCULAR | Status: DC | PRN

## 2016-07-29 MED ORDER — PENTAFLUOROPROP-TETRAFLUOROETH EX AERO: 1.0000 "application " | INHALATION_SPRAY | CUTANEOUS | Status: DC | PRN

## 2016-07-29 MED ORDER — DEXTROSE 5 % IV SOLN
1.0000 g | INTRAVENOUS | Status: AC
  Administered 2016-07-29: 1 g via INTRAVENOUS
  Filled 2016-07-29: qty 10

## 2016-07-29 NOTE — Progress Notes (Signed)
PROGRESS NOTE                                                                                                                                                                                                             Patient Demographics:    Sarah Kelley, is a 66 y.o. female, DOB - Dec 30, 1950, PQA:[id number]  Admit date - 07/28/2016   Admitting Physician Vianne Bulls, MD  Outpatient Primary MD for the patient is Arnoldo Morale, MD  LOS - 1  Outpatient Specialists: renal  Chief Complaint  Patient presents with  . Altered Mental Status  . Fever       Brief Narrative   66 year old Hispanic female with chronic kidney disease stage V,, insulin-dependent diabetes mellitus, hypertension, depression who presented to ED with 2-3 days of fevers with confusion and hallucinations. History provided by family. She has a LUE fistula for future dialysis which has matured. Her husband was initially hesitant on dialysis but has now agreed. No documented fever at home, nausea, vomiting or diarrhea. She has been having intermittent hallucination and confusion, seeing people and describing bizarre events to the family that has never occurred. In the ED she was septic with fever of 101.37F, tachycardic. Chest x-ray showing  lobar opacity concerning for pneumonia. Labs showed BUN of 80 and creatinine of 10.4, bicarbonate of 18 and serum glucose of 297. WBC was elevated to 15.7 K hemoglobin of 8.9. Lactic acid will is normal, mildly elevated INR with UA showing proteinuria and microscopic hematuria. CT abdomen and pelvis given right upper quadrant pain showing small left and moderate right pleural effusion with bilateral lung base consolidation. (R>L) Patient was given 1 L normal saline bolus, cultures sent and placed on empiric antibiotics. Flu PCR was negative.    Subjective:    Patient more oriented today. Reports generalized  discomfort.   Assessment  & Plan :    Principal Problem: Sepsis secondary to lobar pneumonia.   Sepsis currently resolved. Blood cultures growing Streptococcus pneumoniae. Monitor on empiric Rocephin.. Follow cultures. Monitor respiratory viral panel. Metabolic encephalopathy improving. Supportive care with Tylenol).  Active Problems:  Acute metabolic encephalopathy Secondary to sepsis with lobar pneumonia. Slowly clearing. Continue antibiotics. In the checks.  Acute on chronic kidney disease stage V Has LUL fistula placed recently. Nephrology consulted for evaluation. Family agree on dialysis. Monitor  I/O.    Normocytic anemia Secondary to chronic kidney disease. Stable at baseline.  Uncontrolled type 2 diabetes mellitus Resume home dose Lantus with pre-meal aspart. Continue to monitor.    Depression Continue Cymbalta.    Essential hypertension Stable.       Code Status : Full code  Family Communication  : Son at bedside  Disposition Plan  : Pending hospital course  Barriers For Discharge : Active symptoms  Consults  : Nephrology  Procedures  : CT abdomen and pelvis  DVT Prophylaxis  : Subcutaneous heparin  Lab Results  Component Value Date   PLT 218 07/28/2016    Antibiotics  :  Anti-infectives    Start     Dose/Rate Route Frequency Ordered Stop   07/29/16 2100  cefTRIAXone (ROCEPHIN) 2 g in dextrose 5 % 50 mL IVPB     2 g 100 mL/hr over 30 Minutes Intravenous Every 24 hours 07/29/16 0608     07/29/16 0700  cefTRIAXone (ROCEPHIN) 1 g in dextrose 5 % 50 mL IVPB     1 g 100 mL/hr over 30 Minutes Intravenous NOW 07/29/16 0608 07/29/16 0710   07/28/16 2200  oseltamivir (TAMIFLU) capsule 30 mg     30 mg Oral  Once 07/28/16 1902 07/28/16 2129   07/28/16 1915  cefTRIAXone (ROCEPHIN) 1 g in dextrose 5 % 50 mL IVPB  Status:  Discontinued     1 g 100 mL/hr over 30 Minutes Intravenous Every 24 hours 07/28/16 1902 07/29/16 0608   07/28/16 1915  azithromycin  (ZITHROMAX) 500 mg in dextrose 5 % 250 mL IVPB  Status:  Discontinued     500 mg 250 mL/hr over 60 Minutes Intravenous Every 24 hours 07/28/16 1902 07/29/16 0608   07/28/16 1430  piperacillin-tazobactam (ZOSYN) IVPB 3.375 g     3.375 g 100 mL/hr over 30 Minutes Intravenous  Once 07/28/16 1420 07/28/16 1652        Objective:   Vitals:   07/28/16 1900 07/28/16 1908 07/28/16 2025 07/29/16 0612  BP: 167/98  (!) 147/45 (!) 133/50  Pulse: 93  95 88  Resp: 21  18 18   Temp:  100 F (37.8 C) 100.2 F (37.9 C) 98.2 F (36.8 C)  TempSrc:  Oral Oral Oral  SpO2: 99%  96% 100%  Weight:   75.5 kg (166 lb 7.2 oz)     Wt Readings from Last 3 Encounters:  07/28/16 75.5 kg (166 lb 7.2 oz)  06/12/16 78.6 kg (173 lb 3.2 oz)  03/17/16 73 kg (161 lb)     Intake/Output Summary (Last 24 hours) at 07/29/16 1241 Last data filed at 07/28/16 1814  Gross per 24 hour  Intake             1000 ml  Output              300 ml  Net              700 ml     Physical Exam  Gen: not in distress HEENT:  moist mucosa, supple neck Chest: Diminished bibasilar breath sounds, CVS: N S1&S2, no murmurs,  GI: soft, NT, ND, BS+ Musculoskeletal: warm, no edema, LUL AV graft with good thrill CNS: AAOX2, nonfocal    Data Review:    CBC  Recent Labs Lab 07/28/16 1032  WBC 15.7*  HGB 8.9*  HCT 27.3*  PLT 218  MCV 88.3  MCH 28.8  MCHC 32.6  RDW 15.1    Chemistries  Recent Labs Lab 07/28/16 1032  NA 138  K 4.9  CL 108  CO2 18*  GLUCOSE 297*  BUN 80*  CREATININE 10.41*  CALCIUM 8.1*  AST 19  ALT 13*  ALKPHOS 129*  BILITOT 0.6   ------------------------------------------------------------------------------------------------------------------ No results for input(s): CHOL, HDL, LDLCALC, TRIG, CHOLHDL, LDLDIRECT in the last 72 hours.  Lab Results  Component Value Date   HGBA1C 9.8 06/12/2016    ------------------------------------------------------------------------------------------------------------------ No results for input(s): TSH, T4TOTAL, T3FREE, THYROIDAB in the last 72 hours.  Invalid input(s): FREET3 ------------------------------------------------------------------------------------------------------------------  Recent Labs  07/28/16 2044  VITAMINB12 788  FOLATE 20.5  FERRITIN 100  TIBC 239*  IRON 12*  RETICCTPCT 1.7    Coagulation profile  Recent Labs Lab 07/28/16 1427  INR 1.36    No results for input(s): DDIMER in the last 72 hours.  Cardiac Enzymes No results for input(s): CKMB, TROPONINI, MYOGLOBIN in the last 168 hours.  Invalid input(s): CK ------------------------------------------------------------------------------------------------------------------ No results found for: BNP  Inpatient Medications  Scheduled Meds: . atorvastatin  40 mg Oral QHS  . cefTRIAXone (ROCEPHIN)  IV  2 g Intravenous Q24H  . DULoxetine  60 mg Oral Daily  . gabapentin  600 mg Oral QHS  . heparin  5,000 Units Subcutaneous Q8H  . insulin aspart  0-15 Units Subcutaneous TID WC  . insulin aspart  0-5 Units Subcutaneous QHS  . insulin aspart  3 Units Subcutaneous TID WC  . insulin glargine  20 Units Subcutaneous QHS  . sodium chloride flush  3 mL Intravenous Q12H   Continuous Infusions: PRN Meds:.sodium chloride, acetaminophen, HYDROcodone-acetaminophen, sodium chloride flush  Micro Results Recent Results (from the past 240 hour(s))  Culture, blood (Routine x 2)     Status: None (Preliminary result)   Collection Time: 07/28/16 10:42 AM  Result Value Ref Range Status   Specimen Description BLOOD RIGHT HAND  Final   Special Requests BOTTLES DRAWN AEROBIC AND ANAEROBIC  5CC  Final   Culture  Setup Time   Final    GRAM POSITIVE COCCI IN PAIRS IN BOTH AEROBIC AND ANAEROBIC BOTTLES CRITICAL RESULT CALLED TO, READ BACK BY AND VERIFIED WITH: Nicole Cella, PHARMD  @0447  07/29/16 MKELLY,MLT    Culture   Final    GRAM POSITIVE COCCI CULTURE REINCUBATED FOR BETTER GROWTH    Report Status PENDING  Incomplete  Blood Culture ID Panel (Reflexed)     Status: Abnormal   Collection Time: 07/28/16 10:42 AM  Result Value Ref Range Status   Enterococcus species NOT DETECTED NOT DETECTED Final   Listeria monocytogenes NOT DETECTED NOT DETECTED Final   Staphylococcus species NOT DETECTED NOT DETECTED Final   Staphylococcus aureus NOT DETECTED NOT DETECTED Final   Streptococcus species DETECTED (A) NOT DETECTED Final    Comment: CRITICAL RESULT CALLED TO, READ BACK BY AND VERIFIED WITH: RUTH CLARK,PHARMD @0447  07/29/16 MKELLY,.MLT    Streptococcus agalactiae NOT DETECTED NOT DETECTED Final   Streptococcus pneumoniae DETECTED (A) NOT DETECTED Final    Comment: CRITICAL RESULT CALLED TO, READ BACK BY AND VERIFIED WITH: RUTH CLARK,PHARMD @0447  07/29/16 MKELLY,MLT    Streptococcus pyogenes NOT DETECTED NOT DETECTED Final   Acinetobacter baumannii NOT DETECTED NOT DETECTED Final   Enterobacteriaceae species NOT DETECTED NOT DETECTED Final   Enterobacter cloacae complex NOT DETECTED NOT DETECTED Final   Escherichia coli NOT DETECTED NOT DETECTED Final   Klebsiella oxytoca NOT DETECTED NOT DETECTED Final   Klebsiella pneumoniae NOT DETECTED NOT DETECTED Final   Proteus species NOT DETECTED  NOT DETECTED Final   Serratia marcescens NOT DETECTED NOT DETECTED Final   Haemophilus influenzae NOT DETECTED NOT DETECTED Final   Neisseria meningitidis NOT DETECTED NOT DETECTED Final   Pseudomonas aeruginosa NOT DETECTED NOT DETECTED Final   Candida albicans NOT DETECTED NOT DETECTED Final   Candida glabrata NOT DETECTED NOT DETECTED Final   Candida krusei NOT DETECTED NOT DETECTED Final   Candida parapsilosis NOT DETECTED NOT DETECTED Final   Candida tropicalis NOT DETECTED NOT DETECTED Final  Culture, blood (Routine x 2)     Status: None (Preliminary result)    Collection Time: 07/28/16  2:27 PM  Result Value Ref Range Status   Specimen Description BLOOD RIGHT HAND  Final   Special Requests IN PEDIATRIC BOTTLE  1CC  Final   Culture  Setup Time   Final    GRAM POSITIVE COCCI IN PAIRS IN PEDIATRIC BOTTLE CRITICAL VALUE NOTED.  VALUE IS CONSISTENT WITH PREVIOUSLY REPORTED AND CALLED VALUE.    Culture NO GROWTH < 24 HOURS  Final   Report Status PENDING  Incomplete    Radiology Reports Ct Abdomen Pelvis Wo Contrast  Result Date: 07/28/2016 CLINICAL DATA:  Back and abdominal pain. EXAM: CT ABDOMEN AND PELVIS WITHOUT CONTRAST TECHNIQUE: Multidetector CT imaging of the abdomen and pelvis was performed following the standard protocol without IV contrast. COMPARISON:  05/18/2015 FINDINGS: Lower chest: Calcified granuloma over the right middle lobe unchanged. Small left effusion and moderate size right pleural effusion. Associated consolidation over the right middle and lower lobes likely atelectasis although cannot exclude infection. Minimal left basilar atelectasis versus infection. Hepatobiliary: 3 mm calcification the region of the porta hepatis unchanged. Gallbladder, biliary tree and liver are otherwise unremarkable. Pancreas: Within normal. Spleen: Within normal. Adrenals/Urinary Tract: Adrenal glands are normal. Kidneys are normal in size without hydronephrosis, focal mass or nephrolithiasis. Ureters are normal. Bladder is mildly distended but otherwise unremarkable. Stomach/Bowel: Stomach and small bowel are within normal. Appendix is not visualized. Colon is within normal. Vascular/Lymphatic: Mild calcified plaque over the abdominal aorta and iliac arteries. No significant adenopathy. Reproductive: Uterus and adnexa are unremarkable. Other: Small ventral hernia over the midline below the umbilicus containing only peritoneal fat unchanged. 1.1 cm oval dense focus over the mesentery of the left lower quadrant likely small peritoneal loose body.  Musculoskeletal: Mild degenerative change of the spine and hips. IMPRESSION: Small left effusion and moderate right pleural effusion. Consolidation over the lung bases right worse than left which may be due to atelectasis or infection. No evidence of nephroureterolithiasis or obstruction. Small midline ventral hernia below the umbilicus containing only peritoneal fat. Electronically Signed   By: Marin Olp M.D.   On: 07/28/2016 16:53   Dg Chest 2 View  Result Date: 07/28/2016 CLINICAL DATA:  Fever.  Altered mental status.  Chest pain. EXAM: CHEST  2 VIEW COMPARISON:  12/31/2008 FINDINGS: The cardiac silhouette appears mildly enlarged, accentuated by low lung volumes. Aortic atherosclerosis is noted. There is new mild diffuse interstitial accentuation. Airspace opacities are present in the right greater than left lung bases, and there small pleural effusions. No pneumothorax is identified. No acute osseous abnormality is seen. IMPRESSION: 1. Low lung volumes with right greater than left basilar airspace opacities, concerning for pneumonia although a combination of edema and atelectasis could also give a similar appearance. 2. Small pleural effusions. 3. Aortic atherosclerosis. Electronically Signed   By: Logan Bores M.D.   On: 07/28/2016 11:00    Time Spent in minutes 35  Louellen Molder M.D on 07/29/2016 at 12:41 PM  Between 7am to 7pm - Pager - [phone]  After 7pm go to [email or web address] - password [facility]  [facility] -  Office  [phone]

## 2016-07-29 NOTE — Progress Notes (Signed)
Pt arrived on the unit via bed alert but disoriented. Pt only speaks spanish but grand daughter is able to translate who is by the bedside. No complaints of pain.  IV to the left Ac. Skin assessment complete with Stefan Church Rn.  Old bruise to the left thigh and third toe amputated. Educated the grand daughter on the proper way to reach the staff.  Activated the bed alarm.  Lowered the bed. Will continue to monitor the patient

## 2016-07-29 NOTE — Procedures (Signed)
  I was present at this dialysis session, have reviewed the session itself and made  appropriate changes Kelly Splinter MD [place] pager [phone]   07/29/2016, 7:11 PM

## 2016-07-29 NOTE — Consult Note (Signed)
Renal Service Consult Note Sarah Kelley 07/29/2016 [place] Requesting Physician:  Dr Clementeen Graham  Reason for Consult:  CKD pt with AMS HPI: The patient is a 66 y.o. year-old with AMS x 2-3 days, confused, unable to eat at home.  Also has "tremblar", the trembles.  +hallucinations.  Creat is 10, asked to see for CKD.    Pt f/b Dr Justin Mend with [facility].  Last creat in [facility]IC was 3.9 in JUly 2017.  Had AVF LUA created in Sept 2017.  No issues.  Reportedly she was recently scheduled to start dialysis but her husband decided against it. The husband here/ now reportedly has consented to dialysis.    In ED pt had fever 38.7 C, tachy, CXR R> L basilar PNA.  CT for abd pain was negative.   Patient gives no hx.  Family state that she has been not eating well for several days.  No n/v/d. Some abd pain.  No SOB or cough.  No appetitie  ROS  n/a  Past Medical History  Past Medical History:  Diagnosis Date  . Diabetes mellitus   . Hyperlipidemia   . Hypertension   . Neuropathy ([place])    Diabetic  . Renal disorder   . Thyroid disease    Past Surgical History  Past Surgical History:  Procedure Laterality Date  . AMPUTATION Left 05/21/2015   Procedure: Left Foot 3rd Ray Amputation;  Surgeon: Newt Minion, MD;  Location: [facility];  Service: Orthopedics;  Laterality: Left;  . AV FISTULA PLACEMENT Left 03/28/2016   Procedure: ARTERIOVENOUS (AV) FISTULA CREATION LEFT ARM;  Surgeon: Waynetta Sandy, MD;  Location: [facility];  Service: Vascular;  Laterality: Left;  . DILATION AND CURETTAGE OF UTERUS     Family History History reviewed. No pertinent family history. Social History  reports that she has never smoked. She has never used smokeless tobacco. She reports that she drinks alcohol. She reports that she does not use drugs. Allergies No Known Allergies Home medications Prior to Admission medications   Medication Sig Start Date End Date Taking? Authorizing  Provider  acetaminophen (TYLENOL) 325 MG tablet Take 650 mg by mouth every 6 (six) hours as needed for mild pain. Reported on 09/13/2015   Yes Historical Provider, MD  atorvastatin (LIPITOR) 40 MG tablet Take 1 tablet (40 mg total) by mouth daily. 06/12/16  Yes Arnoldo Morale, MD  cetirizine (ZYRTEC) 10 MG tablet Take 1 tablet (10 mg total) by mouth daily. 06/12/16  Yes Arnoldo Morale, MD  DULoxetine (CYMBALTA) 60 MG capsule Take 1 capsule (60 mg total) by mouth daily. 06/12/16  Yes Arnoldo Morale, MD  gabapentin (NEURONTIN) 300 MG capsule Take 2 capsules (600 mg total) by mouth at bedtime. 06/12/16  Yes Arnoldo Morale, MD  insulin glargine (LANTUS) 100 UNIT/ML injection Take 30 units every morning  Take 20 units every evening 06/12/16  Yes Arnoldo Morale, MD  valsartan (DIOVAN) 160 MG tablet Take 1 tablet (160 mg total) by mouth daily. 06/12/16  Yes Arnoldo Morale, MD   Liver Function Tests  Recent Labs Lab 07/28/16 1032  AST 19  ALT 13*  ALKPHOS 129*  BILITOT 0.6  PROT 5.4*  ALBUMIN 2.0*   No results for input(s): LIPASE, AMYLASE in the last 168 hours. CBC  Recent Labs Lab 07/28/16 1032  WBC 15.7*  HGB 8.9*  HCT 27.3*  MCV 88.3  PLT 254   Basic Metabolic Panel  Recent Labs Lab 07/28/16 1032  NA 138  K 4.9  CL 108  CO2 18*  GLUCOSE 297*  BUN 80*  CREATININE 10.41*  CALCIUM 8.1*   Iron/TIBC/Ferritin/ %Sat    Component Value Date/Time   IRON 12 (L) 07/28/2016 2044   TIBC 239 (L) 07/28/2016 2044   FERRITIN 100 07/28/2016 2044   IRONPCTSAT 5 (L) 07/28/2016 2044    Vitals:   07/28/16 1900 07/28/16 1908 07/28/16 2025 07/29/16 0612  BP: 167/98  (!) 147/45 (!) 133/50  Pulse: 93  95 88  Resp: 21  18 18   Temp:  100 F (37.8 C) 100.2 F (37.9 C) 98.2 F (36.8 C)  TempSrc:  Oral Oral Oral  SpO2: 99%  96% 100%  Weight:   75.5 kg (166 lb 7.2 oz)    Exam Gen elderly hispanic female, confused, +asterixis No rash, cyanosis or gangrene Sclera anicteric, throat clear  No  jvd or bruits Chest mostly clear, some crackles bilat bases RRR no MRG Abd soft ntnd no mass or ascites +bs GU defer MS no joint effusions or deformity Ext no LE edema / L 3rd toe absent/ no wounds or ulcers Neuro is alert, Ox 1, nonfocal, gen'd weakness, follows simple commands  Na 138  K 4.9  BUN 80  Cr 10.41  Ca 8  Alb 2.0   WBC 15k  Hb 8.9 CXR:  R > L airspace opacities, favor PNA R > L  Assessment: 1. 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  Needs HD, plan HD today and tomorrow. Will attempt to use AVF. 2. AMS - from uremia and PNA 3. Pneumococcal PNA w bacteremia - BP's stable fortunately 4. HTN - losartan on hold 5. IDDM 6. Depression    Plan - as above  Kelly Splinter MD [place] pager [phone]   07/29/2016, 1:10 PM

## 2016-07-29 NOTE — Progress Notes (Signed)
PHARMACY - PHYSICIAN COMMUNICATION CRITICAL VALUE ALERT - BLOOD CULTURE IDENTIFICATION (BCID)  Results for orders placed or performed during the hospital encounter of 07/28/16  Blood Culture ID Panel (Reflexed) (Collected: 07/28/2016 10:42 AM)  Result Value Ref Range   Enterococcus species NOT DETECTED NOT DETECTED   Listeria monocytogenes NOT DETECTED NOT DETECTED   Staphylococcus species NOT DETECTED NOT DETECTED   Staphylococcus aureus NOT DETECTED NOT DETECTED   Streptococcus species DETECTED (A) NOT DETECTED   Streptococcus agalactiae NOT DETECTED NOT DETECTED   Streptococcus pneumoniae DETECTED (A) NOT DETECTED   Streptococcus pyogenes NOT DETECTED NOT DETECTED   Acinetobacter baumannii NOT DETECTED NOT DETECTED   Enterobacteriaceae species NOT DETECTED NOT DETECTED   Enterobacter cloacae complex NOT DETECTED NOT DETECTED   Escherichia coli NOT DETECTED NOT DETECTED   Klebsiella oxytoca NOT DETECTED NOT DETECTED   Klebsiella pneumoniae NOT DETECTED NOT DETECTED   Proteus species NOT DETECTED NOT DETECTED   Serratia marcescens NOT DETECTED NOT DETECTED   Haemophilus influenzae NOT DETECTED NOT DETECTED   Neisseria meningitidis NOT DETECTED NOT DETECTED   Pseudomonas aeruginosa NOT DETECTED NOT DETECTED   Candida albicans NOT DETECTED NOT DETECTED   Candida glabrata NOT DETECTED NOT DETECTED   Candida krusei NOT DETECTED NOT DETECTED   Candida parapsilosis NOT DETECTED NOT DETECTED   Candida tropicalis NOT DETECTED NOT DETECTED    Name of physician (or Provider) Contacted:  K. Threasa Alpha, NP  Changes to prescribed antibiotics required:  Increased Ceftriaxone dose to  2g IV q24h . Discontinue Azithromycin   Nicole Cella, RPh Clinical Pharmacist 07/29/2016  6:53 AM

## 2016-07-30 DIAGNOSIS — Z992 Dependence on renal dialysis: Secondary | ICD-10-CM

## 2016-07-30 LAB — GLUCOSE, CAPILLARY
GLUCOSE-CAPILLARY: 99 mg/dL (ref 65–99)
Glucose-Capillary: 106 mg/dL — ABNORMAL HIGH (ref 65–99)
Glucose-Capillary: 134 mg/dL — ABNORMAL HIGH (ref 65–99)
Glucose-Capillary: 103 mg/dL — ABNORMAL HIGH (ref 65–99)

## 2016-07-30 LAB — RENAL FUNCTION PANEL
ALBUMIN: 1.6 g/dL — AB (ref 3.5–5.0)
Anion gap: 10 (ref 5–15)
BUN: 42 mg/dL — ABNORMAL HIGH (ref 6–20)
CHLORIDE: 103 mmol/L (ref 101–111)
CO2: 24 mmol/L (ref 22–32)
CREATININE: 6.76 mg/dL — AB (ref 0.44–1.00)
Calcium: 7.7 mg/dL — ABNORMAL LOW (ref 8.9–10.3)
GFR calc Af Amer: 7 mL/min — ABNORMAL LOW (ref >60)
GFR, EST NON AFRICAN AMERICAN: 6 mL/min — AB (ref >60)
GLUCOSE: 109 mg/dL — AB (ref 65–99)
Phosphorus: 5.2 mg/dL — ABNORMAL HIGH (ref 2.5–4.6)
Potassium: 3.9 mmol/L (ref 3.5–5.1)
Sodium: 137 mmol/L (ref 135–145)

## 2016-07-30 LAB — CBC
HCT: 25.3 % — ABNORMAL LOW (ref 36.0–46.0)
Hemoglobin: 8.2 g/dL — ABNORMAL LOW (ref 12.0–15.0)
MCH: 28.4 pg (ref 26.0–34.0)
MCHC: 32.4 g/dL (ref 30.0–36.0)
MCV: 87.5 fL (ref 78.0–100.0)
PLATELETS: 221 10*3/uL (ref 150–400)
RBC: 2.89 MIL/uL — ABNORMAL LOW (ref 3.87–5.11)
RDW: 14.9 % (ref 11.5–15.5)
WBC: 9.7 10*3/uL (ref 4.0–10.5)

## 2016-07-30 LAB — HEPATITIS B SURFACE ANTIBODY,QUALITATIVE: HEP B S AB: REACTIVE

## 2016-07-30 LAB — HEPATITIS B CORE ANTIBODY, TOTAL: Hep B Core Total Ab: POSITIVE — AB

## 2016-07-30 LAB — HEPATITIS B SURFACE ANTIGEN: Hepatitis B Surface Ag: NEGATIVE

## 2016-07-30 MED ORDER — KIDNEY FAILURE BOOK
Freq: Once | Status: DC
  Filled 2016-07-30: qty 1

## 2016-07-30 MED ORDER — INSULIN GLARGINE 100 UNIT/ML ~~LOC~~ SOLN
18.0000 [IU] | Freq: Every day | SUBCUTANEOUS | Status: DC
  Administered 2016-07-30 – 2016-08-01 (×3): 18 [IU] via SUBCUTANEOUS
  Filled 2016-07-30 (×3): qty 0.18

## 2016-07-30 MED ORDER — INSULIN GLARGINE 100 UNIT/ML ~~LOC~~ SOLN: 15.0000 [IU] | Freq: Every day | SUBCUTANEOUS | Status: DC

## 2016-07-30 MED ORDER — ONDANSETRON HCL 4 MG/2ML IJ SOLN
4.0000 mg | Freq: Four times a day (QID) | INTRAMUSCULAR | Status: DC | PRN
  Administered 2016-07-30 – 2016-08-03 (×4): 4 mg via INTRAVENOUS
  Filled 2016-07-30 (×6): qty 2

## 2016-07-30 NOTE — Evaluation (Signed)
Physical Therapy Evaluation Patient Details Name: Mahari Vankirk MRN: [id number] DOB: 1951/06/08 Today's Date: 07/30/2016   History of Present Illness  66 year old Hispanic female with chronic kidney disease stage V,, insulin-dependent diabetes mellitus, hypertension, depression who presented to ED with 2-3 days of fevers with confusion and hallucinations. Septic in ED; Pneumonia; new to HD, plan is to get her to Outpt HD  Clinical Impression   Pt admitted with above diagnosis. Pt currently with functional limitations due to the deficits listed below (see PT Problem List). Very weak, and requiring mod assist for most aspects of mobility; We discussed dc options including CIR (I think she would do well there), but ultimately pt and family indicated they would rather go home; they have a ramp, and are pretty well-equipped with DME, so this is not unreasonable; if there is any way to get it, she would benefit form HHPT follow up;  Pt will benefit from skilled PT to increase their independence and safety with mobility to allow discharge to the venue listed below.       Follow Up Recommendations Home health PT;Supervision/Assistance - 24 hour  HHRN for chronic disease management    Equipment Recommendations  None recommended by PT (pretty well-equipped)    Recommendations for Other Services OT consult     Precautions / Restrictions Precautions Precautions: Fall      Mobility  Bed Mobility Overal bed mobility: Needs Assistance Bed Mobility: Supine to Sit     Supine to sit: Mod assist     General bed mobility comments: Mod assist to pull to sit; cues for technique; used bed pad to square off hips at EOB  Transfers Overall transfer level: Needs assistance Equipment used: Rolling walker (2 wheeled) Transfers: Sit to/from Stand Sit to Stand: Mod assist         General transfer comment: Light mod assist to rise; cues fo rhand placement and  safety  Ambulation/Gait Ambulation/Gait assistance: Min assist Ambulation Distance (Feet):  (Marched in place for 20-30 paces) Assistive device: Rolling walker (2 wheeled)       General Gait Details: Decr step height; dependent on UEs for stability  Stairs            Wheelchair Mobility    Modified Rankin (Stroke Patients Only)       Balance                                             Pertinent Vitals/Pain Pain Assessment: No/denies pain    Home Living Family/patient expects to be discharged to:: Private residence Living Arrangements: Spouse/significant other Available Help at Discharge: Available 24 hours/day Type of Home: Mobile home Home Access: Ramped entrance     Home Layout: One level Home Equipment: Environmental consultant - 2 wheels;Bedside commode;Cane - single point;Wheelchair - Brewing technologist      Prior Function Level of Independence: Needs assistance   Gait / Transfers Assistance Needed: RW for amb; wheelchair prn           Hand Dominance        Extremity/Trunk Assessment   Upper Extremity Assessment Upper Extremity Assessment: Generalized weakness    Lower Extremity Assessment Lower Extremity Assessment: Generalized weakness       Communication   Communication: Prefers language other than English (Spanish)  Cognition Arousal/Alertness: Awake/alert Behavior During Therapy: WFL for tasks assessed/performed Overall Cognitive Status: Within Functional Limits  for tasks assessed (for simple mobility)                      General Comments General comments (skin integrity, edema, etc.): Session conducted on Room Air, and O2 sats stayed at or above 91%    Exercises     Assessment/Plan    PT Assessment Patient needs continued PT services  PT Problem List Decreased strength;Decreased activity tolerance;Decreased balance;Decreased mobility;Decreased coordination;Decreased knowledge of use of DME;Decreased safety  awareness;Decreased knowledge of precautions          PT Treatment Interventions DME instruction;Gait training;Functional mobility training;Therapeutic activities;Therapeutic exercise;Patient/family education    PT Goals (Current goals can be found in the Care Plan section)  Acute Rehab PT Goals Patient Stated Goal: Hopes to get home soom PT Goal Formulation: With patient Time For Goal Achievement: 08/13/16 Potential to Achieve Goals: Good    Frequency Min 3X/week   Barriers to discharge        Co-evaluation               End of Session   Activity Tolerance: Patient tolerated treatment well Patient left: in bed;with call bell/phone within reach;with family/visitor present Nurse Communication: Mobility status         Time: 1630-1700 PT Time Calculation (min) (ACUTE ONLY): 30 min   Charges:   PT Evaluation $PT Eval Moderate Complexity: 1 Procedure PT Treatments $Therapeutic Activity: 8-22 mins   PT G Codes:        Colletta Maryland 07/30/2016, 5:51 PM  Roney Marion, [place] Pager [phone] Office [phone]

## 2016-07-30 NOTE — Plan of Care (Signed)
Problem: Activity: Goal: Risk for activity intolerance will decrease Outcome: Progressing Up to chair today, needs PT/OT eval  Problem: Nutrition: Goal: Adequate nutrition will be maintained Outcome: Progressing Eating better today.  Problem: Bowel/Gastric: Goal: Will not experience complications related to bowel motility Outcome: Completed/Met Date Met: 07/30/16 BM today

## 2016-07-30 NOTE — Progress Notes (Signed)
  [facility] Progress Note   Subjective: had HD yest, is more alert and interactive today  Vitals:   07/30/16 0620 07/30/16 0620 07/30/16 0934 07/30/16 1259  BP:  (!) 153/55 (!) 136/46 (!) 142/56  Pulse:  92 95 90  Resp:  18 18 20   Temp:  99.4 F (37.4 C)  98.5 F (36.9 C)  TempSrc:  Oral  Oral  SpO2:  100% 94% 100%  Weight: 76.1 kg (167 lb 12.3 oz)       Inpatient medications: . atorvastatin  40 mg Oral QHS  . cefTRIAXone (ROCEPHIN)  IV  2 g Intravenous Q24H  . DULoxetine  60 mg Oral Daily  . gabapentin  600 mg Oral QHS  . heparin  5,000 Units Subcutaneous Q8H  . insulin aspart  0-15 Units Subcutaneous TID WC  . insulin aspart  0-5 Units Subcutaneous QHS  . insulin glargine  18 Units Subcutaneous QHS  . kidney failure book   Does not apply Once  . sodium chloride flush  3 mL Intravenous Q12H    sodium chloride, acetaminophen, HYDROcodone-acetaminophen, sodium chloride flush  Exam: Gen elderly hispanic female, no asterixis, alert, answers questions No jvd or bruits Chest clear bilat RRR no MRG Abd soft ntnd no mass or ascites +bs Ext no LE edema / L 3rd toe absent Neuro alert, nonfocal, responds appropriately LUA AVF+bruit  CXR:  R > L airspace opacities, favor PNA R > L  Assessment: 1. CKD stage V / uremia - improved sp HD x 1 yest. Plan HD tomorrow, start CLIP process Monday. Used AVF w/o problems.  2. AMS - improving, from #1/#3 3. Pneumococcal PNA w bacteremia - on IV Rocephin now 4. UTI - susceptibilities pending 5. HTN - BP's good, home BP med on hold 6. IDDM   Plan - HD tomorrow #2   Kelly Splinter MD [facility] pager [phone]   07/30/2016, 1:27 PM    Recent Labs Lab 07/28/16 1032 07/30/16 0353  NA 138 137  K 4.9 3.9  CL 108 103  CO2 18* 24  GLUCOSE 297* 109*  BUN 80* 42*  CREATININE 10.41* 6.76*  CALCIUM 8.1* 7.7*  PHOS  --  5.2*    Recent Labs Lab 07/28/16 1032 07/29/16 1612 07/30/16 0353   AST 19  --   --   ALT 13* 12*  --   ALKPHOS 129*  --   --   BILITOT 0.6  --   --   PROT 5.4*  --   --   ALBUMIN 2.0*  --  1.6*    Recent Labs Lab 07/28/16 1032 07/30/16 0353  WBC 15.7* 9.7  HGB 8.9* 8.2*  HCT 27.3* 25.3*  MCV 88.3 87.5  PLT 218 221   Iron/TIBC/Ferritin/ %Sat    Component Value Date/Time   IRON 12 (L) 07/28/2016 2044   TIBC 239 (L) 07/28/2016 2044   FERRITIN 100 07/28/2016 2044   IRONPCTSAT 5 (L) 07/28/2016 2044

## 2016-07-30 NOTE — Progress Notes (Signed)
Pt. is nauseated and vomited twice today small amounts of clear emesis.  Text paged Dr. Clementeen Graham to see if we can get some medications.  Will continue to monitor and await for return call or new orders.  Alphonzo Lemmings, RN

## 2016-07-30 NOTE — Progress Notes (Addendum)
PROGRESS NOTE                                                                                                                                                                                                             Patient Demographics:    Sarah Kelley, is a 66 y.o. female, DOB - 25-Sep-1950, KZL:[id number]  Admit date - 07/28/2016   Admitting Physician Vianne Bulls, MD  Outpatient Primary MD for the patient is Arnoldo Morale, MD  LOS - 2  Outpatient Specialists: renal  Chief Complaint  Patient presents with  . Altered Mental Status  . Fever       Brief Narrative   66 year old Hispanic female with chronic kidney disease stage V,, insulin-dependent diabetes mellitus, hypertension, depression who presented to ED with 2-3 days of fevers with confusion and hallucinations. History provided by family. She has a LUE fistula for future dialysis which has matured. Her husband was initially hesitant on dialysis but has now agreed. No documented fever at home, nausea, vomiting or diarrhea. She has been having intermittent hallucination and confusion, seeing people and describing bizarre events to the family that has never occurred. In the ED she was septic with fever of 101.43F, tachycardic. Chest x-ray showing  lobar opacity concerning for pneumonia. Labs showed BUN of 80 and creatinine of 10.4, bicarbonate of 18 and serum glucose of 297. WBC was elevated to 15.7 K hemoglobin of 8.9. Lactic acid will is normal, mildly elevated INR with UA showing proteinuria and microscopic hematuria. CT abdomen and pelvis given right upper quadrant pain showing small left and moderate right pleural effusion with bilateral lung base consolidation. (R>L) Patient was given 1 L normal saline bolus, cultures sent and placed on empiric antibiotics. Flu PCR was negative.    Subjective:   Patient much oriented today. Denies any specific  symptoms.   Assessment  & Plan :    Principal Problem: Sepsis secondary to lobar pneumonia.   Sepsis resolved. Blood cultures growing Streptococcus pneumoniae.  empiric Rocephin.. Encephalopathy resolving.  Active Problems:  Acute metabolic encephalopathy Secondary to sepsis with lobar pneumonia and uremia. Improving. Continue antibiotics.   Acute on chronic kidney disease stage V Has LUL fistula placed recently. Nephrology consulted . Started on hemodialysis on 1/27. HIGH Phosphorous level.    Normocytic anemia Secondary to chronic kidney disease. Stable at  baseline.  Uncontrolled type 2 diabetes mellitus CBG stable. I will reduce her home dose Lantus and discontinue female aspart known that she is on dialysis to avoid any hypoglycemic episodes.    Depression Continue Cymbalta.    Essential hypertension Stable.       Code Status : Full code  Family Communication  : Son at bedside  Disposition Plan  : Pending hospital course, CLIP for HD. Possibly in the next 48 hours.  Barriers For Discharge : Improving  Consults  : Nephrology  Procedures  :  CT abdomen and pelvis Hemodialysis  DVT Prophylaxis  : Subcutaneous heparin  Lab Results  Component Value Date   PLT 221 07/30/2016    Antibiotics  :  Anti-infectives    Start     Dose/Rate Route Frequency Ordered Stop   07/29/16 2100  cefTRIAXone (ROCEPHIN) 2 g in dextrose 5 % 50 mL IVPB     2 g 100 mL/hr over 30 Minutes Intravenous Every 24 hours 07/29/16 0608     07/29/16 0700  cefTRIAXone (ROCEPHIN) 1 g in dextrose 5 % 50 mL IVPB     1 g 100 mL/hr over 30 Minutes Intravenous NOW 07/29/16 0608 07/29/16 0710   07/28/16 2200  oseltamivir (TAMIFLU) capsule 30 mg     30 mg Oral  Once 07/28/16 1902 07/28/16 2129   07/28/16 1915  cefTRIAXone (ROCEPHIN) 1 g in dextrose 5 % 50 mL IVPB  Status:  Discontinued     1 g 100 mL/hr over 30 Minutes Intravenous Every 24 hours 07/28/16 1902 07/29/16 0608   07/28/16 1915   azithromycin (ZITHROMAX) 500 mg in dextrose 5 % 250 mL IVPB  Status:  Discontinued     500 mg 250 mL/hr over 60 Minutes Intravenous Every 24 hours 07/28/16 1902 07/29/16 0608   07/28/16 1430  piperacillin-tazobactam (ZOSYN) IVPB 3.375 g     3.375 g 100 mL/hr over 30 Minutes Intravenous  Once 07/28/16 1420 07/28/16 1652        Objective:   Vitals:   07/29/16 2020 07/30/16 0620 07/30/16 0620 07/30/16 0934  BP: (!) 137/53  (!) 153/55 (!) 136/46  Pulse: 90  92 95  Resp: 18  18 18   Temp: 98 F (36.7 C)  99.4 F (37.4 C)   TempSrc: Oral  Oral   SpO2: 95%  100% 94%  Weight:  76.1 kg (167 lb 12.3 oz)      Wt Readings from Last 3 Encounters:  07/30/16 76.1 kg (167 lb 12.3 oz)  06/12/16 78.6 kg (173 lb 3.2 oz)  03/17/16 73 kg (161 lb)     Intake/Output Summary (Last 24 hours) at 07/30/16 1115 Last data filed at 07/30/16 0914  Gross per 24 hour  Intake              540 ml  Output                0 ml  Net              540 ml     Physical Exam  Gen: not in distress HEENT:  moist mucosa, supple neck Chest: Bilateral breath sounds CVS: N S1&S2, no murmurs,  GI: soft, NT, ND, BS+ Musculoskeletal: warm, no edema, LUL AV graft with good thrill CNS: Alert and oriented 3    Data Review:    CBC  Recent Labs Lab 07/28/16 1032 07/30/16 0353  WBC 15.7* 9.7  HGB 8.9* 8.2*  HCT 27.3* 25.3*  PLT  218 221  MCV 88.3 87.5  MCH 28.8 28.4  MCHC 32.6 32.4  RDW 15.1 14.9    Chemistries   Recent Labs Lab 07/28/16 1032 07/29/16 1612 07/30/16 0353  NA 138  --  137  K 4.9  --  3.9  CL 108  --  103  CO2 18*  --  24  GLUCOSE 297*  --  109*  BUN 80*  --  42*  CREATININE 10.41*  --  6.76*  CALCIUM 8.1*  --  7.7*  AST 19  --   --   ALT 13* 12*  --   ALKPHOS 129*  --   --   BILITOT 0.6  --   --    ------------------------------------------------------------------------------------------------------------------ No results for input(s): CHOL, HDL, LDLCALC, TRIG, CHOLHDL,  LDLDIRECT in the last 72 hours.  Lab Results  Component Value Date   HGBA1C 9.8 06/12/2016   ------------------------------------------------------------------------------------------------------------------ No results for input(s): TSH, T4TOTAL, T3FREE, THYROIDAB in the last 72 hours.  Invalid input(s): FREET3 ------------------------------------------------------------------------------------------------------------------  Recent Labs  07/28/16 2044  VITAMINB12 788  FOLATE 20.5  FERRITIN 100  TIBC 239*  IRON 12*  RETICCTPCT 1.7    Coagulation profile  Recent Labs Lab 07/28/16 1427  INR 1.36    No results for input(s): DDIMER in the last 72 hours.  Cardiac Enzymes No results for input(s): CKMB, TROPONINI, MYOGLOBIN in the last 168 hours.  Invalid input(s): CK ------------------------------------------------------------------------------------------------------------------ No results found for: BNP  Inpatient Medications  Scheduled Meds: . atorvastatin  40 mg Oral QHS  . cefTRIAXone (ROCEPHIN)  IV  2 g Intravenous Q24H  . DULoxetine  60 mg Oral Daily  . gabapentin  600 mg Oral QHS  . heparin  5,000 Units Subcutaneous Q8H  . insulin aspart  0-15 Units Subcutaneous TID WC  . insulin aspart  0-5 Units Subcutaneous QHS  . insulin aspart  3 Units Subcutaneous TID WC  . insulin glargine  20 Units Subcutaneous QHS  . kidney failure book   Does not apply Once  . sodium chloride flush  3 mL Intravenous Q12H   Continuous Infusions: PRN Meds:.sodium chloride, acetaminophen, HYDROcodone-acetaminophen, sodium chloride flush  Micro Results Recent Results (from the past 240 hour(s))  Culture, blood (Routine x 2)     Status: Abnormal (Preliminary result)   Collection Time: 07/28/16 10:42 AM  Result Value Ref Range Status   Specimen Description BLOOD RIGHT HAND  Final   Special Requests BOTTLES DRAWN AEROBIC AND ANAEROBIC  5CC  Final   Culture  Setup Time   Final     GRAM POSITIVE COCCI IN PAIRS IN BOTH AEROBIC AND ANAEROBIC BOTTLES CRITICAL RESULT CALLED TO, READ BACK BY AND VERIFIED WITH: Nicole Cella, PHARMD @0447  07/29/16 MKELLY,MLT    Culture (A)  Final    STREPTOCOCCUS PNEUMONIAE SUSCEPTIBILITIES TO FOLLOW    Report Status PENDING  Incomplete  Blood Culture ID Panel (Reflexed)     Status: Abnormal   Collection Time: 07/28/16 10:42 AM  Result Value Ref Range Status   Enterococcus species NOT DETECTED NOT DETECTED Final   Listeria monocytogenes NOT DETECTED NOT DETECTED Final   Staphylococcus species NOT DETECTED NOT DETECTED Final   Staphylococcus aureus NOT DETECTED NOT DETECTED Final   Streptococcus species DETECTED (A) NOT DETECTED Final    Comment: CRITICAL RESULT CALLED TO, READ BACK BY AND VERIFIED WITH: RUTH CLARK,PHARMD @0447  07/29/16 MKELLY,.MLT    Streptococcus agalactiae NOT DETECTED NOT DETECTED Final   Streptococcus pneumoniae DETECTED (A) NOT DETECTED Final  Comment: CRITICAL RESULT CALLED TO, READ BACK BY AND VERIFIED WITH: RUTH CLARK,PHARMD @0447  07/29/16 MKELLY,MLT    Streptococcus pyogenes NOT DETECTED NOT DETECTED Final   Acinetobacter baumannii NOT DETECTED NOT DETECTED Final   Enterobacteriaceae species NOT DETECTED NOT DETECTED Final   Enterobacter cloacae complex NOT DETECTED NOT DETECTED Final   Escherichia coli NOT DETECTED NOT DETECTED Final   Klebsiella oxytoca NOT DETECTED NOT DETECTED Final   Klebsiella pneumoniae NOT DETECTED NOT DETECTED Final   Proteus species NOT DETECTED NOT DETECTED Final   Serratia marcescens NOT DETECTED NOT DETECTED Final   Haemophilus influenzae NOT DETECTED NOT DETECTED Final   Neisseria meningitidis NOT DETECTED NOT DETECTED Final   Pseudomonas aeruginosa NOT DETECTED NOT DETECTED Final   Candida albicans NOT DETECTED NOT DETECTED Final   Candida glabrata NOT DETECTED NOT DETECTED Final   Candida krusei NOT DETECTED NOT DETECTED Final   Candida parapsilosis NOT DETECTED NOT  DETECTED Final   Candida tropicalis NOT DETECTED NOT DETECTED Final  Culture, blood (Routine x 2)     Status: None (Preliminary result)   Collection Time: 07/28/16  2:27 PM  Result Value Ref Range Status   Specimen Description BLOOD RIGHT HAND  Final   Special Requests IN PEDIATRIC BOTTLE  1CC  Final   Culture  Setup Time   Final    GRAM POSITIVE COCCI IN PAIRS IN PEDIATRIC BOTTLE CRITICAL VALUE NOTED.  VALUE IS CONSISTENT WITH PREVIOUSLY REPORTED AND CALLED VALUE.    Culture GRAM POSITIVE COCCI  Final   Report Status PENDING  Incomplete  Urine culture     Status: Abnormal (Preliminary result)   Collection Time: 07/28/16  5:09 PM  Result Value Ref Range Status   Specimen Description URINE, RANDOM  Final   Special Requests NONE  Final   Culture (A)  Final    20,000 COLONIES/mL MORGANELLA MORGANII 50,000 COLONIES/mL ESCHERICHIA COLI SUSCEPTIBILITIES TO FOLLOW    Report Status PENDING  Incomplete  Respiratory Panel by PCR     Status: None   Collection Time: 07/29/16  7:32 AM  Result Value Ref Range Status   Adenovirus NOT DETECTED NOT DETECTED Final   Coronavirus 229E NOT DETECTED NOT DETECTED Final   Coronavirus HKU1 NOT DETECTED NOT DETECTED Final   Coronavirus NL63 NOT DETECTED NOT DETECTED Final   Coronavirus OC43 NOT DETECTED NOT DETECTED Final   Metapneumovirus NOT DETECTED NOT DETECTED Final   Rhinovirus / Enterovirus NOT DETECTED NOT DETECTED Final   Influenza A NOT DETECTED NOT DETECTED Final   Influenza B NOT DETECTED NOT DETECTED Final   Parainfluenza Virus 1 NOT DETECTED NOT DETECTED Final   Parainfluenza Virus 2 NOT DETECTED NOT DETECTED Final   Parainfluenza Virus 3 NOT DETECTED NOT DETECTED Final   Parainfluenza Virus 4 NOT DETECTED NOT DETECTED Final   Respiratory Syncytial Virus NOT DETECTED NOT DETECTED Final   Bordetella pertussis NOT DETECTED NOT DETECTED Final   Chlamydophila pneumoniae NOT DETECTED NOT DETECTED Final   Mycoplasma pneumoniae NOT DETECTED  NOT DETECTED Final    Radiology Reports Ct Abdomen Pelvis Wo Contrast  Result Date: 07/28/2016 CLINICAL DATA:  Back and abdominal pain. EXAM: CT ABDOMEN AND PELVIS WITHOUT CONTRAST TECHNIQUE: Multidetector CT imaging of the abdomen and pelvis was performed following the standard protocol without IV contrast. COMPARISON:  05/18/2015 FINDINGS: Lower chest: Calcified granuloma over the right middle lobe unchanged. Small left effusion and moderate size right pleural effusion. Associated consolidation over the right middle and lower lobes likely atelectasis  although cannot exclude infection. Minimal left basilar atelectasis versus infection. Hepatobiliary: 3 mm calcification the region of the porta hepatis unchanged. Gallbladder, biliary tree and liver are otherwise unremarkable. Pancreas: Within normal. Spleen: Within normal. Adrenals/Urinary Tract: Adrenal glands are normal. Kidneys are normal in size without hydronephrosis, focal mass or nephrolithiasis. Ureters are normal. Bladder is mildly distended but otherwise unremarkable. Stomach/Bowel: Stomach and small bowel are within normal. Appendix is not visualized. Colon is within normal. Vascular/Lymphatic: Mild calcified plaque over the abdominal aorta and iliac arteries. No significant adenopathy. Reproductive: Uterus and adnexa are unremarkable. Other: Small ventral hernia over the midline below the umbilicus containing only peritoneal fat unchanged. 1.1 cm oval dense focus over the mesentery of the left lower quadrant likely small peritoneal loose body. Musculoskeletal: Mild degenerative change of the spine and hips. IMPRESSION: Small left effusion and moderate right pleural effusion. Consolidation over the lung bases right worse than left which may be due to atelectasis or infection. No evidence of nephroureterolithiasis or obstruction. Small midline ventral hernia below the umbilicus containing only peritoneal fat. Electronically Signed   By: Marin Olp  M.D.   On: 07/28/2016 16:53   Dg Chest 2 View  Result Date: 07/28/2016 CLINICAL DATA:  Fever.  Altered mental status.  Chest pain. EXAM: CHEST  2 VIEW COMPARISON:  12/31/2008 FINDINGS: The cardiac silhouette appears mildly enlarged, accentuated by low lung volumes. Aortic atherosclerosis is noted. There is new mild diffuse interstitial accentuation. Airspace opacities are present in the right greater than left lung bases, and there small pleural effusions. No pneumothorax is identified. No acute osseous abnormality is seen. IMPRESSION: 1. Low lung volumes with right greater than left basilar airspace opacities, concerning for pneumonia although a combination of edema and atelectasis could also give a similar appearance. 2. Small pleural effusions. 3. Aortic atherosclerosis. Electronically Signed   By: Logan Bores M.D.   On: 07/28/2016 11:00    Time Spent in minutes:25   Louellen Molder M.D on 07/30/2016 at 11:15 AM  Between 7am to 7pm - Pager - [phone]  After 7pm go to [email or web address] - password [facility]  [facility] -  Office  [phone]

## 2016-07-31 ENCOUNTER — Encounter (HOSPITAL_COMMUNITY): Payer: Self-pay | Admitting: *Deleted

## 2016-07-31 DIAGNOSIS — R1011 Right upper quadrant pain: Secondary | ICD-10-CM

## 2016-07-31 LAB — RENAL FUNCTION PANEL
ALBUMIN: 1.5 g/dL — AB (ref 3.5–5.0)
ANION GAP: 11 (ref 5–15)
BUN: 48 mg/dL — ABNORMAL HIGH (ref 6–20)
CO2: 24 mmol/L (ref 22–32)
Calcium: 7.7 mg/dL — ABNORMAL LOW (ref 8.9–10.3)
Chloride: 102 mmol/L (ref 101–111)
Creatinine, Ser: 7.83 mg/dL — ABNORMAL HIGH (ref 0.44–1.00)
GFR, EST AFRICAN AMERICAN: 5 mL/min — AB (ref >60)
GFR, EST NON AFRICAN AMERICAN: 5 mL/min — AB (ref >60)
Glucose, Bld: 72 mg/dL (ref 65–99)
PHOSPHORUS: 7.5 mg/dL — AB (ref 2.5–4.6)
POTASSIUM: 4.3 mmol/L (ref 3.5–5.1)
SODIUM: 137 mmol/L (ref 135–145)

## 2016-07-31 LAB — URINE CULTURE: Culture: 20000 — AB

## 2016-07-31 LAB — HEPATIC FUNCTION PANEL
ALBUMIN: 1.6 g/dL — AB (ref 3.5–5.0)
ALT: 14 U/L (ref 14–54)
AST: 25 U/L (ref 15–41)
Alkaline Phosphatase: 107 U/L (ref 38–126)
BILIRUBIN TOTAL: 0.6 mg/dL (ref 0.3–1.2)
Bilirubin, Direct: 0.1 mg/dL (ref 0.1–0.5)
Indirect Bilirubin: 0.5 mg/dL (ref 0.3–0.9)
Total Protein: 5.3 g/dL — ABNORMAL LOW (ref 6.5–8.1)

## 2016-07-31 LAB — CULTURE, BLOOD (ROUTINE X 2)

## 2016-07-31 LAB — GLUCOSE, CAPILLARY
GLUCOSE-CAPILLARY: 72 mg/dL (ref 65–99)
GLUCOSE-CAPILLARY: 95 mg/dL (ref 65–99)
GLUCOSE-CAPILLARY: 135 mg/dL — AB (ref 65–99)

## 2016-07-31 LAB — VITAMIN B1: VITAMIN B1 (THIAMINE): 193.4 nmol/L (ref 66.5–200.0)

## 2016-07-31 MED ORDER — HEPARIN SODIUM (PORCINE) 1000 UNIT/ML DIALYSIS: 1000.0000 [IU] | INTRAMUSCULAR | Status: DC | PRN

## 2016-07-31 MED ORDER — PNEUMOCOCCAL VAC POLYVALENT 25 MCG/0.5ML IJ INJ
0.5000 mL | INJECTION | INTRAMUSCULAR | Status: AC | PRN
  Administered 2016-08-04: 0.5 mL via INTRAMUSCULAR
  Filled 2016-07-31: qty 0.5

## 2016-07-31 MED ORDER — HEPARIN SODIUM (PORCINE) 5000 UNIT/ML IJ SOLN
5000.0000 [IU] | Freq: Three times a day (TID) | INTRAMUSCULAR | Status: DC
  Administered 2016-08-02 – 2016-08-04 (×8): 5000 [IU] via SUBCUTANEOUS
  Filled 2016-07-31 (×8): qty 1

## 2016-07-31 MED ORDER — HEPARIN SODIUM (PORCINE) 1000 UNIT/ML DIALYSIS: 3000.0000 [IU] | INTRAMUSCULAR | Status: DC | PRN

## 2016-07-31 MED ORDER — LIDOCAINE HCL (PF) 1 % IJ SOLN: 5.0000 mL | INTRAMUSCULAR | Status: DC | PRN

## 2016-07-31 MED ORDER — ALTEPLASE 2 MG IJ SOLR: 2.0000 mg | Freq: Once | INTRAMUSCULAR | Status: DC | PRN

## 2016-07-31 MED ORDER — SODIUM CHLORIDE 0.9 % IV SOLN: 100.0000 mL | INTRAVENOUS | Status: DC | PRN

## 2016-07-31 MED ORDER — PENTAFLUOROPROP-TETRAFLUOROETH EX AERO: 1.0000 "application " | INHALATION_SPRAY | CUTANEOUS | Status: DC | PRN

## 2016-07-31 MED ORDER — LIDOCAINE-PRILOCAINE 2.5-2.5 % EX CREA: 1.0000 "application " | TOPICAL_CREAM | CUTANEOUS | Status: DC | PRN

## 2016-07-31 NOTE — Progress Notes (Signed)
  [facility] Progress Note    Assessment/ Plan:   1. CKD stage V / uremia - improved sp HD x 1 yest.  - Plan HD this afternoon (#2), start CLIP process Monday. Used AVF w/o problems; great bruit but difficult to fully examine with the bandaids still on even with the adherent tape removed.  2. AMS - improving, from #1/#3 (uremia) 3. Pneumococcal PNA w bacteremia - on IV Rocephin now. Fever curve trending down and <99 deg past 2 days. 4. UTI - morganella and ecoli both susc to rocephin. 5. HTN - BP's good, home BP med on hold 6. IDDM   Subjective:   Feeling better and denies f/c/n/v. Appetite not great but she forced herself to eat the breakfast this am.    Objective:   BP (!) 142/53 (BP Location: Right Arm)   Pulse 85   Temp 98.9 F (37.2 C) (Oral)   Resp 18   Wt 76 kg (167 lb 8 oz)   SpO2 99%   BMI 32.71 kg/m   Intake/Output Summary (Last 24 hours) at 07/31/16 0955 Last data filed at 07/31/16 2595  Gross per 24 hour  Intake              290 ml  Output                0 ml  Net              290 ml   Weight change: 0.278 kg (9.8 oz)  Physical Exam: Gen elderly hispanic female, no asterixis, alert, answers questions No jvd or bruits Chest clear bilat RRR no MRG Abd soft ntnd no mass or ascites +bs Ext no LEedema / L 3rd toe absent Neuro alert, nonfocal, responds appropriately LUA BCF+bruit augments well at inflow; difficult to examine even after removing the tape (still has bandaids on, no bleeding)  Imaging: No results found.  Labs: BMET  Recent Labs Lab 07/28/16 1032 07/30/16 0353 07/31/16 0529  NA 138 137 137  K 4.9 3.9 4.3  CL 108 103 102  CO2 18* 24 24  GLUCOSE 297* 109* 72  BUN 80* 42* 48*  CREATININE 10.41* 6.76* 7.83*  CALCIUM 8.1* 7.7* 7.7*  PHOS  --  5.2* 7.5*   CBC  Recent Labs Lab 07/28/16 1032 07/30/16 0353  WBC 15.7* 9.7  HGB 8.9* 8.2*  HCT 27.3* 25.3*  MCV 88.3 87.5  PLT 218 221    Medications:    .  atorvastatin  40 mg Oral QHS  . cefTRIAXone (ROCEPHIN)  IV  2 g Intravenous Q24H  . DULoxetine  60 mg Oral Daily  . gabapentin  600 mg Oral QHS  . heparin  5,000 Units Subcutaneous Q8H  . insulin aspart  0-15 Units Subcutaneous TID WC  . insulin aspart  0-5 Units Subcutaneous QHS  . insulin glargine  18 Units Subcutaneous QHS  . kidney failure book   Does not apply Once  . sodium chloride flush  3 mL Intravenous Q12H      Otelia Santee, MD 07/31/2016, 9:55 AM

## 2016-07-31 NOTE — Care Management Note (Signed)
Case Management Note  Patient Details  Name: Sarah Kelley MRN: [id number] Date of Birth: March 03, 1951  Subjective/Objective:        Admitted with PNA.            Action/Plan: Plan is to d/c to home when medically stable.  Post hospital follow up appointment scheduled on 08/08/2016 at 3:45pm with Dr. Owens Loffler.  Expected Discharge Date:                  Expected Discharge Plan:  [place]  In-House Referral:     Discharge [facility], Follow-up appt scheduled  Post Acute Care Choice:    Choice offered to:  Patient  DME Arranged:    DME Agency:     HH Arranged:    [place] Agency:     Status of Service:  In process, will continue to follow  If discussed at Long Length of Stay Meetings, dates discussed:    Additional Comments:  Sharin Mons, RN 07/31/2016, 10:59 AM

## 2016-07-31 NOTE — Consult Note (Signed)
Chief Complaint: Patient was seen in consultation today for tunneled dialysis catheter placement Chief Complaint  Patient presents with  . Altered Mental Status  . Fever   at the request of Amalia Hailey [facility]  Referring Physician(s): Dr Cathlean Sauer  Supervising Physician: Arne Cleveland  Patient Status: [facility] - In-pt  History of Present Illness: Sarah Kelley is a 66 y.o. female   IDDM; HTN Presented to ED 1/26 with fever; confusion; AMS CKD stage V Worsening renal function Has LUA fistula Last used successfully Sat Today Dialysis RN reports inability to access venous site; ?clotted Attempted 3x  Nephrology was made aware and IR requested to place tunneled dialysis catheter Will plan for 1/30  Pt does not speak English; husband does not speak [place] daughter in room is Fluent in [place] I did discuss with granddaughter with good understanding Interpreter to room asap.     Past Medical History:  Diagnosis Date  . Diabetes mellitus   . Hyperlipidemia   . Hypertension   . Neuropathy ([place])    Diabetic  . Renal disorder   . Thyroid disease     Past Surgical History:  Procedure Laterality Date  . AMPUTATION Left 05/21/2015   Procedure: Left Foot 3rd Ray Amputation;  Surgeon: Newt Minion, MD;  Location: [place];  Service: Orthopedics;  Laterality: Left;  . AV FISTULA PLACEMENT Left 03/28/2016   Procedure: ARTERIOVENOUS (AV) FISTULA CREATION LEFT ARM;  Surgeon: Waynetta Sandy, MD;  Location: [place];  Service: Vascular;  Laterality: Left;  . DILATION AND CURETTAGE OF UTERUS      Allergies: Patient has no known allergies.  Medications: Prior to Admission medications   Medication Sig Start Date End Date Taking? Authorizing Provider  acetaminophen (TYLENOL) 325 MG tablet Take 650 mg by mouth every 6 (six) hours as needed for mild pain. Reported on 09/13/2015   Yes Historical Provider, MD  atorvastatin (LIPITOR) 40 MG tablet Take 1 tablet  (40 mg total) by mouth daily. 06/12/16  Yes Arnoldo Morale, MD  cetirizine (ZYRTEC) 10 MG tablet Take 1 tablet (10 mg total) by mouth daily. 06/12/16  Yes Arnoldo Morale, MD  DULoxetine (CYMBALTA) 60 MG capsule Take 1 capsule (60 mg total) by mouth daily. 06/12/16  Yes Arnoldo Morale, MD  gabapentin (NEURONTIN) 300 MG capsule Take 2 capsules (600 mg total) by mouth at bedtime. 06/12/16  Yes Arnoldo Morale, MD  insulin glargine (LANTUS) 100 UNIT/ML injection Take 30 units every morning  Take 20 units every evening 06/12/16  Yes Arnoldo Morale, MD  valsartan (DIOVAN) 160 MG tablet Take 1 tablet (160 mg total) by mouth daily. 06/12/16  Yes Arnoldo Morale, MD     History reviewed. No pertinent family history.  Social History   Social History  . Marital status: Married    Spouse name: N/A  . Number of children: N/A  . Years of education: N/A   Social History Main Topics  . Smoking status: Never Smoker  . Smokeless tobacco: Never Used  . Alcohol use No     Comment: rare- a beer every now and then  . Drug use: No  . Sexual activity: Not Currently   Other Topics Concern  . None   Social History Narrative  . None    Review of Systems: A 12 point ROS discussed and pertinent positives are indicated in the HPI above.  All other systems are negative.  Review of Systems  Constitutional: Positive for activity change and fatigue. Negative  for appetite change, chills and fever.  Respiratory: Positive for cough.   Neurological: Positive for weakness.  Psychiatric/Behavioral: Positive for confusion. Negative for behavioral problems.    Vital Signs: BP (!) 159/64 (BP Location: Right Arm)   Pulse 92   Temp 98.4 F (36.9 C) (Oral)   Resp 18   Wt 167 lb 1.7 oz (75.8 kg)   LMP  (LMP Unknown)   SpO2 97%   BMI 32.64 kg/m   Physical Exam  Cardiovascular: Normal rate and regular rhythm.   Pulmonary/Chest: Effort normal and breath sounds normal.  Abdominal: Soft. Bowel sounds are normal.    Musculoskeletal: Normal range of motion.  Neurological: She is alert.  Skin: Skin is warm.  Psychiatric:  Consent to be signed with husband and interpreter  Nursing note and vitals reviewed.   Mallampati Score:  MD Evaluation Airway: WNL Heart: WNL Abdomen: WNL Chest/ Lungs: WNL ASA  Classification: 3 Mallampati/Airway Score: One  Imaging: Ct Abdomen Pelvis Wo Contrast  Result Date: 07/28/2016 CLINICAL DATA:  Back and abdominal pain. EXAM: CT ABDOMEN AND PELVIS WITHOUT CONTRAST TECHNIQUE: Multidetector CT imaging of the abdomen and pelvis was performed following the standard protocol without IV contrast. COMPARISON:  05/18/2015 FINDINGS: Lower chest: Calcified granuloma over the right middle lobe unchanged. Small left effusion and moderate size right pleural effusion. Associated consolidation over the right middle and lower lobes likely atelectasis although cannot exclude infection. Minimal left basilar atelectasis versus infection. Hepatobiliary: 3 mm calcification the region of the porta hepatis unchanged. Gallbladder, biliary tree and liver are otherwise unremarkable. Pancreas: Within normal. Spleen: Within normal. Adrenals/Urinary Tract: Adrenal glands are normal. Kidneys are normal in size without hydronephrosis, focal mass or nephrolithiasis. Ureters are normal. Bladder is mildly distended but otherwise unremarkable. Stomach/Bowel: Stomach and small bowel are within normal. Appendix is not visualized. Colon is within normal. Vascular/Lymphatic: Mild calcified plaque over the abdominal aorta and iliac arteries. No significant adenopathy. Reproductive: Uterus and adnexa are unremarkable. Other: Small ventral hernia over the midline below the umbilicus containing only peritoneal fat unchanged. 1.1 cm oval dense focus over the mesentery of the left lower quadrant likely small peritoneal loose body. Musculoskeletal: Mild degenerative change of the spine and hips. IMPRESSION: Small left  effusion and moderate right pleural effusion. Consolidation over the lung bases right worse than left which may be due to atelectasis or infection. No evidence of nephroureterolithiasis or obstruction. Small midline ventral hernia below the umbilicus containing only peritoneal fat. Electronically Signed   By: Marin Olp M.D.   On: 07/28/2016 16:53   Dg Chest 2 View  Result Date: 07/28/2016 CLINICAL DATA:  Fever.  Altered mental status.  Chest pain. EXAM: CHEST  2 VIEW COMPARISON:  12/31/2008 FINDINGS: The cardiac silhouette appears mildly enlarged, accentuated by low lung volumes. Aortic atherosclerosis is noted. There is new mild diffuse interstitial accentuation. Airspace opacities are present in the right greater than left lung bases, and there small pleural effusions. No pneumothorax is identified. No acute osseous abnormality is seen. IMPRESSION: 1. Low lung volumes with right greater than left basilar airspace opacities, concerning for pneumonia although a combination of edema and atelectasis could also give a similar appearance. 2. Small pleural effusions. 3. Aortic atherosclerosis. Electronically Signed   By: Logan Bores M.D.   On: 07/28/2016 11:00    Labs:  CBC:  Recent Labs  03/28/16 0650 07/28/16 1032 07/30/16 0353  WBC  --  15.7* 9.7  HGB 10.2* 8.9* 8.2*  HCT 30.0* 27.3* 25.3*  PLT  --  218 221    COAGS:  Recent Labs  07/28/16 1427  INR 1.36    BMP:  Recent Labs  01/13/16 1051 03/28/16 0650 07/28/16 1032 07/30/16 0353 07/31/16 0529  NA 139 139 138 137 137  K 4.7 4.2 4.9 3.9 4.3  CL 106  --  108 103 102  CO2 25  --  18* 24 24  GLUCOSE 212* 298* 297* 109* 72  BUN 49*  --  80* 42* 48*  CALCIUM 8.1*  --  8.1* 7.7* 7.7*  CREATININE 3.97*  --  10.41* 6.76* 7.83*  GFRNONAA 11*  --  3* 6* 5*  GFRAA 13*  --  4* 7* 5*    LIVER FUNCTION TESTS:  Recent Labs  09/13/15 1125 01/13/16 1051 07/28/16 1032 07/29/16 1612 07/30/16 0353 07/31/16 0529  BILITOT  0.3 0.3 0.6  --   --  0.6  AST 20 22 19   --   --  25  ALT 17 19 13* 12*  --  14  ALKPHOS 130 153* 129*  --   --  107  PROT 5.7* 5.8* 5.4*  --   --  5.3*  ALBUMIN 3.1* 3.1* 2.0*  --  1.6* 1.6*  1.5*    TUMOR MARKERS: No results for input(s): AFPTM, CEA, CA199, CHROMGRNA in the last 8760 hours.  Assessment and Plan:  Malfunction of LUA dialysis fistula Nephrology ordering tunneled catheter placement Scheduled for 1/30 in IR Risks and Benefits discussed with the patient's grand daughter at bedside including, but not limited to bleeding, infection, vascular injury, pneumothorax which may require chest tube placement, air embolism or even death All of her questions were answered Consent to be signed with husband and interpreter   Thank you for this interesting consult.  I greatly enjoyed meeting [facility] and look forward to participating in their care.  A copy of this report was sent to the requesting provider on this date.  Electronically Signed: Monia Sabal A 07/31/2016, 3:26 PM   I spent a total of 20 Minutes    in face to face in clinical consultation, greater than 50% of which was counseling/coordinating care for tunneled HD catheter placement

## 2016-07-31 NOTE — Progress Notes (Signed)
PT Cancellation Note  Patient Details Name: Sarah Kelley MRN: [id number] DOB: Mar 28, 1951   Cancelled Treatment:    Reason Eval/Treat Not Completed: Patient at procedure or test/unavailable; patient in HD, per family left about an hour ago.  Will try later if able.    Reginia Naas 07/31/2016, 1:41 PM  Magda Kiel, [place] 07/31/2016

## 2016-07-31 NOTE — Progress Notes (Signed)
PROGRESS NOTE                                                                                                                                                                                                             Patient Demographics:    Sarah Kelley, is a 66 y.o. female, DOB - [birth year], OEV:[id number]  Admit date - 07/28/2016   Admitting Physician Vianne Bulls, MD  Outpatient Primary MD for the patient is Arnoldo Morale, MD  LOS - 3  Outpatient Specialists: renal  Chief Complaint  Patient presents with  . Altered Mental Status  . Fever       Brief Narrative   66 year old Hispanic female with chronic kidney disease stage V,, insulin-dependent diabetes mellitus, hypertension, depression who presented to ED with 2-3 days of fevers with confusion and hallucinations. History provided by family. She has a LUE fistula for future dialysis which has matured. Her husband was initially hesitant on dialysis but has now agreed. No documented fever at home, nausea, vomiting or diarrhea. She has been having intermittent hallucination and confusion, seeing people and describing bizarre events to the family that has never occurred. In the ED she was septic with fever of 101.82F, tachycardic. Chest x-ray showing  lobar opacity concerning for pneumonia. Labs showed BUN of 80 and creatinine of 10.4, bicarbonate of 18 and serum glucose of 297. WBC was elevated to 15.7 K hemoglobin of 8.9. Lactic acid will is normal, mildly elevated INR with UA showing proteinuria and microscopic hematuria. CT abdomen and pelvis given right upper quadrant pain showing small left and moderate right pleural effusion with bilateral lung base consolidation. (R>L) Patient was given 1 L normal saline bolus, cultures sent and placed on empiric antibiotics. Flu PCR was negative.    Subjective:   Complains of some right upper quadrant pain. Had an  episode of vomiting yesterday.   Assessment  & Plan :    Principal Problem: Sepsis secondary to lobar pneumonia.   Sepsis resolved. Blood cultures growing Streptococcus pneumoniae.  empiric Rocephin.. Encephalopathy resolved.  Active Problems:  Acute metabolic encephalopathy Secondary to sepsis with lobar pneumonia and uremia. Now resolved. Continue antibiotics.  Right upper quadrant abdominal pain with nausea CT abdomen on admission negative for gallbladder or liver pathology. Has mild right upper quadrant tenderness. LFTs are normal. Suspect associated with pleurisy (  has right lower lobe consolidation and mild effusion on chest x-ray).  Continue empiric Zofran.  Acute on chronic kidney disease stage V Has LUL fistula placed recently. Nephrology consulted . Started on hemodialysis on 1/27. CLIP process to be initiated.    Normocytic anemia Secondary to chronic kidney disease. Stable at baseline.  Uncontrolled type 2 diabetes mellitus CBG stable. Reduced home dose Lantus since patient now dialysis dependent. Monitor on sliding scale coverage.    Depression Continue Cymbalta.    Essential hypertension Stable.       Code Status : Full code  Family Communication  : Family and bedside  Disposition Plan  : pending CLIP for HD. Possibly in the next 24 hours if outpt HD established  Barriers For Discharge : active issues. CLIP  Consults  : Nephrology  Procedures  :  CT abdomen and pelvis Hemodialysis  DVT Prophylaxis  : Subcutaneous heparin  Lab Results  Component Value Date   PLT 221 07/30/2016    Antibiotics  :  Anti-infectives    Start     Dose/Rate Route Frequency Ordered Stop   07/29/16 2100  cefTRIAXone (ROCEPHIN) 2 g in dextrose 5 % 50 mL IVPB     2 g 100 mL/hr over 30 Minutes Intravenous Every 24 hours 07/29/16 0608     07/29/16 0700  cefTRIAXone (ROCEPHIN) 1 g in dextrose 5 % 50 mL IVPB     1 g 100 mL/hr over 30 Minutes Intravenous NOW 07/29/16  0608 07/29/16 0710   07/28/16 2200  oseltamivir (TAMIFLU) capsule 30 mg     30 mg Oral  Once 07/28/16 1902 07/28/16 2129   07/28/16 1915  cefTRIAXone (ROCEPHIN) 1 g in dextrose 5 % 50 mL IVPB  Status:  Discontinued     1 g 100 mL/hr over 30 Minutes Intravenous Every 24 hours 07/28/16 1902 07/29/16 0608   07/28/16 1915  azithromycin (ZITHROMAX) 500 mg in dextrose 5 % 250 mL IVPB  Status:  Discontinued     500 mg 250 mL/hr over 60 Minutes Intravenous Every 24 hours 07/28/16 1902 07/29/16 0608   07/28/16 1430  piperacillin-tazobactam (ZOSYN) IVPB 3.375 g     3.375 g 100 mL/hr over 30 Minutes Intravenous  Once 07/28/16 1420 07/28/16 1652        Objective:   Vitals:   07/30/16 0934 07/30/16 1259 07/30/16 2120 07/31/16 0606  BP: (!) 136/46 (!) 142/56 (!) 140/56 (!) 142/53  Pulse: 95 90 89 85  Resp: 18 20 18 18   Temp:  98.5 F (36.9 C) 98.7 F (37.1 C) 98.9 F (37.2 C)  TempSrc:  Oral Oral Oral  SpO2: 94% 100% 100% 99%  Weight:    76 kg (167 lb 8 oz)    Wt Readings from Last 3 Encounters:  07/31/16 76 kg (167 lb 8 oz)  06/12/16 78.6 kg (173 lb 3.2 oz)  03/17/16 73 kg (161 lb)     Intake/Output Summary (Last 24 hours) at 07/31/16 1301 Last data filed at 07/31/16 3474  Gross per 24 hour  Intake              290 ml  Output                0 ml  Net              290 ml     Physical Exam  Gen: not in distress HEENT:  moist mucosa, supple neck Chest: Clear Breath sounds CVS: N S1&S2, no  murmurs,  GI: soft, distended, bowel sounds present, right upper quadrant tenderness Musculoskeletal: warm, no edema, LUL AV graft     Data Review:    CBC  Recent Labs Lab 07/28/16 1032 07/30/16 0353  WBC 15.7* 9.7  HGB 8.9* 8.2*  HCT 27.3* 25.3*  PLT 218 221  MCV 88.3 87.5  MCH 28.8 28.4  MCHC 32.6 32.4  RDW 15.1 14.9    Chemistries   Recent Labs Lab 07/28/16 1032 07/29/16 1612 07/30/16 0353 07/31/16 0529  NA 138  --  137 137  K 4.9  --  3.9 4.3  CL 108  --   103 102  CO2 18*  --  24 24  GLUCOSE 297*  --  109* 72  BUN 80*  --  42* 48*  CREATININE 10.41*  --  6.76* 7.83*  CALCIUM 8.1*  --  7.7* 7.7*  AST 19  --   --  25  ALT 13* 12*  --  14  ALKPHOS 129*  --   --  107  BILITOT 0.6  --   --  0.6   ------------------------------------------------------------------------------------------------------------------ No results for input(s): CHOL, HDL, LDLCALC, TRIG, CHOLHDL, LDLDIRECT in the last 72 hours.  Lab Results  Component Value Date   HGBA1C 9.8 06/12/2016   ------------------------------------------------------------------------------------------------------------------ No results for input(s): TSH, T4TOTAL, T3FREE, THYROIDAB in the last 72 hours.  Invalid input(s): FREET3 ------------------------------------------------------------------------------------------------------------------  Recent Labs  07/28/16 2044  VITAMINB12 788  FOLATE 20.5  FERRITIN 100  TIBC 239*  IRON 12*  RETICCTPCT 1.7    Coagulation profile  Recent Labs Lab 07/28/16 1427  INR 1.36    No results for input(s): DDIMER in the last 72 hours.  Cardiac Enzymes No results for input(s): CKMB, TROPONINI, MYOGLOBIN in the last 168 hours.  Invalid input(s): CK ------------------------------------------------------------------------------------------------------------------ No results found for: BNP  Inpatient Medications  Scheduled Meds: . atorvastatin  40 mg Oral QHS  . cefTRIAXone (ROCEPHIN)  IV  2 g Intravenous Q24H  . DULoxetine  60 mg Oral Daily  . gabapentin  600 mg Oral QHS  . heparin  5,000 Units Subcutaneous Q8H  . insulin aspart  0-15 Units Subcutaneous TID WC  . insulin aspart  0-5 Units Subcutaneous QHS  . insulin glargine  18 Units Subcutaneous QHS  . kidney failure book   Does not apply Once  . sodium chloride flush  3 mL Intravenous Q12H   Continuous Infusions: PRN Meds:.sodium chloride, sodium chloride, sodium chloride,  acetaminophen, alteplase, heparin, [START ON 08/01/2016] heparin, HYDROcodone-acetaminophen, lidocaine (PF), lidocaine-prilocaine, ondansetron (ZOFRAN) IV, pentafluoroprop-tetrafluoroeth, sodium chloride flush  Micro Results Recent Results (from the past 240 hour(s))  Culture, blood (Routine x 2)     Status: Abnormal   Collection Time: 07/28/16 10:42 AM  Result Value Ref Range Status   Specimen Description BLOOD RIGHT HAND  Final   Special Requests BOTTLES DRAWN AEROBIC AND ANAEROBIC  5CC  Final   Culture  Setup Time   Final    GRAM POSITIVE COCCI IN PAIRS IN BOTH AEROBIC AND ANAEROBIC BOTTLES CRITICAL RESULT CALLED TO, READ BACK BY AND VERIFIED WITH: Nicole Cella, PHARMD @0447  07/29/16 MKELLY,MLT    Culture STREPTOCOCCUS PNEUMONIAE (A)  Final   Report Status 07/31/2016 FINAL  Final   Organism ID, Bacteria STREPTOCOCCUS PNEUMONIAE  Final      Susceptibility   Streptococcus pneumoniae - MIC*    ERYTHROMYCIN <=0.12 SENSITIVE Sensitive     LEVOFLOXACIN 0.5 SENSITIVE Sensitive     PENICILLIN <=0.06 SENSITIVE Sensitive  CEFTRIAXONE <=0.12 SENSITIVE Sensitive     * STREPTOCOCCUS PNEUMONIAE  Blood Culture ID Panel (Reflexed)     Status: Abnormal   Collection Time: 07/28/16 10:42 AM  Result Value Ref Range Status   Enterococcus species NOT DETECTED NOT DETECTED Final   Listeria monocytogenes NOT DETECTED NOT DETECTED Final   Staphylococcus species NOT DETECTED NOT DETECTED Final   Staphylococcus aureus NOT DETECTED NOT DETECTED Final   Streptococcus species DETECTED (A) NOT DETECTED Final    Comment: CRITICAL RESULT CALLED TO, READ BACK BY AND VERIFIED WITH: RUTH CLARK,PHARMD @0447  07/29/16 MKELLY,.MLT    Streptococcus agalactiae NOT DETECTED NOT DETECTED Final   Streptococcus pneumoniae DETECTED (A) NOT DETECTED Final    Comment: CRITICAL RESULT CALLED TO, READ BACK BY AND VERIFIED WITH: RUTH CLARK,PHARMD @0447  07/29/16 MKELLY,MLT    Streptococcus pyogenes NOT DETECTED NOT DETECTED  Final   Acinetobacter baumannii NOT DETECTED NOT DETECTED Final   Enterobacteriaceae species NOT DETECTED NOT DETECTED Final   Enterobacter cloacae complex NOT DETECTED NOT DETECTED Final   Escherichia coli NOT DETECTED NOT DETECTED Final   Klebsiella oxytoca NOT DETECTED NOT DETECTED Final   Klebsiella pneumoniae NOT DETECTED NOT DETECTED Final   Proteus species NOT DETECTED NOT DETECTED Final   Serratia marcescens NOT DETECTED NOT DETECTED Final   Haemophilus influenzae NOT DETECTED NOT DETECTED Final   Neisseria meningitidis NOT DETECTED NOT DETECTED Final   Pseudomonas aeruginosa NOT DETECTED NOT DETECTED Final   Candida albicans NOT DETECTED NOT DETECTED Final   Candida glabrata NOT DETECTED NOT DETECTED Final   Candida krusei NOT DETECTED NOT DETECTED Final   Candida parapsilosis NOT DETECTED NOT DETECTED Final   Candida tropicalis NOT DETECTED NOT DETECTED Final  Culture, blood (Routine x 2)     Status: Abnormal   Collection Time: 07/28/16  2:27 PM  Result Value Ref Range Status   Specimen Description BLOOD RIGHT HAND  Final   Special Requests IN PEDIATRIC BOTTLE  1CC  Final   Culture  Setup Time   Final    GRAM POSITIVE COCCI IN PAIRS IN PEDIATRIC BOTTLE CRITICAL VALUE NOTED.  VALUE IS CONSISTENT WITH PREVIOUSLY REPORTED AND CALLED VALUE.    Culture (A)  Final    STREPTOCOCCUS PNEUMONIAE SUSCEPTIBILITIES PERFORMED ON PREVIOUS CULTURE WITHIN THE LAST 5 DAYS.    Report Status 07/31/2016 FINAL  Final  Urine culture     Status: Abnormal   Collection Time: 07/28/16  5:09 PM  Result Value Ref Range Status   Specimen Description URINE, RANDOM  Final   Special Requests NONE  Final   Culture (A)  Final    20,000 COLONIES/mL MORGANELLA MORGANII 50,000 COLONIES/mL ESCHERICHIA COLI    Report Status 07/31/2016 FINAL  Final   Organism ID, Bacteria MORGANELLA MORGANII (A)  Final   Organism ID, Bacteria ESCHERICHIA COLI (A)  Final      Susceptibility   Escherichia coli - MIC*     AMPICILLIN <=2 SENSITIVE Sensitive     CEFAZOLIN <=4 SENSITIVE Sensitive     CEFTRIAXONE <=1 SENSITIVE Sensitive     CIPROFLOXACIN <=0.25 SENSITIVE Sensitive     GENTAMICIN <=1 SENSITIVE Sensitive     IMIPENEM <=0.25 SENSITIVE Sensitive     NITROFURANTOIN <=16 SENSITIVE Sensitive     TRIMETH/SULFA <=20 SENSITIVE Sensitive     AMPICILLIN/SULBACTAM <=2 SENSITIVE Sensitive     PIP/TAZO <=4 SENSITIVE Sensitive     Extended ESBL NEGATIVE Sensitive     * 50,000 COLONIES/mL ESCHERICHIA COLI   Morganella  morganii - MIC*    AMPICILLIN >=32 RESISTANT Resistant     CEFAZOLIN >=64 RESISTANT Resistant     CEFTRIAXONE <=1 SENSITIVE Sensitive     CIPROFLOXACIN <=0.25 SENSITIVE Sensitive     GENTAMICIN <=1 SENSITIVE Sensitive     IMIPENEM 1 SENSITIVE Sensitive     NITROFURANTOIN 128 RESISTANT Resistant     TRIMETH/SULFA <=20 SENSITIVE Sensitive     AMPICILLIN/SULBACTAM >=32 RESISTANT Resistant     PIP/TAZO <=4 SENSITIVE Sensitive     * 20,000 COLONIES/mL MORGANELLA MORGANII  Respiratory Panel by PCR     Status: None   Collection Time: 07/29/16  7:32 AM  Result Value Ref Range Status   Adenovirus NOT DETECTED NOT DETECTED Final   Coronavirus 229E NOT DETECTED NOT DETECTED Final   Coronavirus HKU1 NOT DETECTED NOT DETECTED Final   Coronavirus NL63 NOT DETECTED NOT DETECTED Final   Coronavirus OC43 NOT DETECTED NOT DETECTED Final   Metapneumovirus NOT DETECTED NOT DETECTED Final   Rhinovirus / Enterovirus NOT DETECTED NOT DETECTED Final   Influenza A NOT DETECTED NOT DETECTED Final   Influenza B NOT DETECTED NOT DETECTED Final   Parainfluenza Virus 1 NOT DETECTED NOT DETECTED Final   Parainfluenza Virus 2 NOT DETECTED NOT DETECTED Final   Parainfluenza Virus 3 NOT DETECTED NOT DETECTED Final   Parainfluenza Virus 4 NOT DETECTED NOT DETECTED Final   Respiratory Syncytial Virus NOT DETECTED NOT DETECTED Final   Bordetella pertussis NOT DETECTED NOT DETECTED Final   Chlamydophila pneumoniae NOT  DETECTED NOT DETECTED Final   Mycoplasma pneumoniae NOT DETECTED NOT DETECTED Final    Radiology Reports Ct Abdomen Pelvis Wo Contrast  Result Date: 07/28/2016 CLINICAL DATA:  Back and abdominal pain. EXAM: CT ABDOMEN AND PELVIS WITHOUT CONTRAST TECHNIQUE: Multidetector CT imaging of the abdomen and pelvis was performed following the standard protocol without IV contrast. COMPARISON:  05/18/2015 FINDINGS: Lower chest: Calcified granuloma over the right middle lobe unchanged. Small left effusion and moderate size right pleural effusion. Associated consolidation over the right middle and lower lobes likely atelectasis although cannot exclude infection. Minimal left basilar atelectasis versus infection. Hepatobiliary: 3 mm calcification the region of the porta hepatis unchanged. Gallbladder, biliary tree and liver are otherwise unremarkable. Pancreas: Within normal. Spleen: Within normal. Adrenals/Urinary Tract: Adrenal glands are normal. Kidneys are normal in size without hydronephrosis, focal mass or nephrolithiasis. Ureters are normal. Bladder is mildly distended but otherwise unremarkable. Stomach/Bowel: Stomach and small bowel are within normal. Appendix is not visualized. Colon is within normal. Vascular/Lymphatic: Mild calcified plaque over the abdominal aorta and iliac arteries. No significant adenopathy. Reproductive: Uterus and adnexa are unremarkable. Other: Small ventral hernia over the midline below the umbilicus containing only peritoneal fat unchanged. 1.1 cm oval dense focus over the mesentery of the left lower quadrant likely small peritoneal loose body. Musculoskeletal: Mild degenerative change of the spine and hips. IMPRESSION: Small left effusion and moderate right pleural effusion. Consolidation over the lung bases right worse than left which may be due to atelectasis or infection. No evidence of nephroureterolithiasis or obstruction. Small midline ventral hernia below the umbilicus  containing only peritoneal fat. Electronically Signed   By: Marin Olp M.D.   On: 07/28/2016 16:53   Dg Chest 2 View  Result Date: 07/28/2016 CLINICAL DATA:  Fever.  Altered mental status.  Chest pain. EXAM: CHEST  2 VIEW COMPARISON:  12/31/2008 FINDINGS: The cardiac silhouette appears mildly enlarged, accentuated by low lung volumes. Aortic atherosclerosis is noted. There is new  mild diffuse interstitial accentuation. Airspace opacities are present in the right greater than left lung bases, and there small pleural effusions. No pneumothorax is identified. No acute osseous abnormality is seen. IMPRESSION: 1. Low lung volumes with right greater than left basilar airspace opacities, concerning for pneumonia although a combination of edema and atelectasis could also give a similar appearance. 2. Small pleural effusions. 3. Aortic atherosclerosis. Electronically Signed   By: Logan Bores M.D.   On: 07/28/2016 11:00    Time Spent in minutes:25   Louellen Molder M.D on 07/31/2016 at 1:01 PM  Between 7am to 7pm - Pager - [phone]  After 7pm go to [email or web address] - password [facility]  [facility] -  Office  [phone]

## 2016-07-31 NOTE — Progress Notes (Signed)
Pt arrived in the unit approximated [facility] in HD unit. Alert in no apparent distress. Access AVF arterial site with no issues aspirated and flushed with normal saline per protocol. Unable to access venous site d/t clotted Attempted x3 and access site non-functional. Charge nurse aware and nephrologist made aware of above. And will return pt back to her unit. Reports given to primary nurse.

## 2016-08-01 ENCOUNTER — Encounter (HOSPITAL_COMMUNITY): Payer: Self-pay | Admitting: Interventional Radiology

## 2016-08-01 ENCOUNTER — Inpatient Hospital Stay (HOSPITAL_COMMUNITY): Payer: Medicaid Other

## 2016-08-01 HISTORY — PX: IR GENERIC HISTORICAL: IMG1180011

## 2016-08-01 LAB — IRON AND TIBC
Iron: 20 ug/dL — ABNORMAL LOW (ref 28–170)
Saturation Ratios: 12 % (ref 10.4–31.8)
TIBC: 167 ug/dL — AB (ref 250–450)
UIBC: 147 ug/dL

## 2016-08-01 LAB — RENAL FUNCTION PANEL
Albumin: 1.3 g/dL — ABNORMAL LOW (ref 3.5–5.0)
Anion gap: 11 (ref 5–15)
BUN: 51 mg/dL — ABNORMAL HIGH (ref 6–20)
CALCIUM: 7.7 mg/dL — AB (ref 8.9–10.3)
CO2: 23 mmol/L (ref 22–32)
CREATININE: 8.73 mg/dL — AB (ref 0.44–1.00)
Chloride: 102 mmol/L (ref 101–111)
GFR, EST AFRICAN AMERICAN: 5 mL/min — AB (ref >60)
GFR, EST NON AFRICAN AMERICAN: 4 mL/min — AB (ref >60)
Glucose, Bld: 92 mg/dL (ref 65–99)
Phosphorus: 8.6 mg/dL — ABNORMAL HIGH (ref 2.5–4.6)
Potassium: 4.5 mmol/L (ref 3.5–5.1)
SODIUM: 136 mmol/L (ref 135–145)

## 2016-08-01 LAB — CBC
HEMATOCRIT: 24.6 % — AB (ref 36.0–46.0)
Hemoglobin: 7.9 g/dL — ABNORMAL LOW (ref 12.0–15.0)
MCH: 28.3 pg (ref 26.0–34.0)
MCHC: 32.1 g/dL (ref 30.0–36.0)
MCV: 88.2 fL (ref 78.0–100.0)
Platelets: 253 10*3/uL (ref 150–400)
RBC: 2.79 MIL/uL — AB (ref 3.87–5.11)
RDW: 14.3 % (ref 11.5–15.5)
WBC: 5.6 10*3/uL (ref 4.0–10.5)

## 2016-08-01 LAB — GLUCOSE, CAPILLARY
GLUCOSE-CAPILLARY: 73 mg/dL (ref 65–99)
Glucose-Capillary: 114 mg/dL — ABNORMAL HIGH (ref 65–99)
Glucose-Capillary: 91 mg/dL (ref 65–99)
Glucose-Capillary: 91 mg/dL (ref 65–99)

## 2016-08-01 LAB — FERRITIN: Ferritin: 245 ng/mL (ref 11–307)

## 2016-08-01 MED ORDER — HEPARIN SODIUM (PORCINE) 1000 UNIT/ML IJ SOLN
INTRAMUSCULAR | Status: AC | PRN
  Administered 2016-08-01: 1000 [IU] via INTRAVENOUS

## 2016-08-01 MED ORDER — CEFAZOLIN SODIUM-DEXTROSE 2-4 GM/100ML-% IV SOLN
INTRAVENOUS | Status: AC
  Administered 2016-08-01: 2 g via INTRAVENOUS
  Filled 2016-08-01: qty 100

## 2016-08-01 MED ORDER — FENTANYL CITRATE (PF) 100 MCG/2ML IJ SOLN
INTRAMUSCULAR | Status: AC | PRN
  Administered 2016-08-01: 50 ug via INTRAVENOUS
  Administered 2016-08-01: 25 ug via INTRAVENOUS

## 2016-08-01 MED ORDER — HEPARIN SODIUM (PORCINE) 1000 UNIT/ML IJ SOLN
INTRAMUSCULAR | Status: AC
Start: 2016-08-01 — End: 2016-08-01
  Filled 2016-08-01: qty 1

## 2016-08-01 MED ORDER — CEFAZOLIN SODIUM-DEXTROSE 2-4 GM/100ML-% IV SOLN
2.0000 g | INTRAVENOUS | Status: AC
  Administered 2016-08-01: 2 g via INTRAVENOUS
  Filled 2016-08-01: qty 100

## 2016-08-01 MED ORDER — LIDOCAINE-EPINEPHRINE 2 %-1:100000 IJ SOLN
INTRAMUSCULAR | Status: AC | PRN
  Administered 2016-08-01: 20 mL

## 2016-08-01 MED ORDER — MIDAZOLAM HCL 2 MG/2ML IJ SOLN
INTRAMUSCULAR | Status: AC
  Filled 2016-08-01: qty 4

## 2016-08-01 MED ORDER — LIDOCAINE-EPINEPHRINE 1 %-1:200000 IJ SOLN
INTRAMUSCULAR | Status: AC
Start: 2016-08-01 — End: 2016-08-01
  Administered 2016-08-01: 30 mL
  Filled 2016-08-01: qty 30

## 2016-08-01 MED ORDER — MIDAZOLAM HCL 2 MG/2ML IJ SOLN
INTRAMUSCULAR | Status: AC | PRN
  Administered 2016-08-01 (×2): 1 mg via INTRAVENOUS

## 2016-08-01 MED ORDER — FENTANYL CITRATE (PF) 100 MCG/2ML IJ SOLN
INTRAMUSCULAR | Status: AC
  Filled 2016-08-01: qty 4

## 2016-08-01 NOTE — Sedation Documentation (Signed)
HD cath to R chest successfully placed

## 2016-08-01 NOTE — Progress Notes (Signed)
Interpreter Lesle Chris for RN [employer]

## 2016-08-01 NOTE — Sedation Documentation (Signed)
Patient is resting comfortably. 

## 2016-08-01 NOTE — Progress Notes (Signed)
  [facility] Progress Note    Assessment/ Plan:   1. CKD stage V / uremia - improved sp HD x 1 yest.  - Plan HD this afternoon after TC is placed(#2), start CLIP process Monday.  - great bruit in the left BCF but multiple nurses and techs were unable to cannulate the venous needle yesterday and on ultrasound bedside the vein is deep in long segments in the cannulation zone (>1cm). - We should have VVS consult for a 2nd opinion as she may need revision w/ superficialization. 2. AMS - improving, from #1/#3 (uremia) 3. Pneumococcal PNA w bacteremia - on IV Rocephin now. Fever curve trending down and <99 deg past 2 days. 4. UTI - morganella and ecoli both susc to rocephin. 5. HTN - BP's good, home BP med on hold 6. IDDM  Subjective:   Feeling better and denies f/c/n/v.  Denies dyspnea, cough despite not being able to HD yesterday.   Objective:   BP (!) 152/59 (BP Location: Right Arm)   Pulse 90   Temp 99.5 F (37.5 C) (Oral)   Resp 18   Ht 5\' 1"  (1.549 m)   Wt 75.8 kg (167 lb 1.7 oz)   LMP  (LMP Unknown)   SpO2 100%   BMI 31.57 kg/m  No intake or output data in the 24 hours ending 08/01/16 0800 Weight change: -0.177 kg (-6.3 oz)  Physical Exam: Gen elderly hispanic female, no asterixis, alert, answers questions No jvd or bruits Chest clear bilat RRR no MRG Abd soft ntnd no mass or ascites +bs Ext no LEedema / L 3rd toe absent Neuro alert, nonfocal, responds appropriately LUA BCF+bruit augments well at inflow   Imaging: No results found.  Labs: BMET  Recent Labs Lab 07/28/16 1032 07/30/16 0353 07/31/16 0529  NA 138 137 137  K 4.9 3.9 4.3  CL 108 103 102  CO2 18* 24 24  GLUCOSE 297* 109* 72  BUN 80* 42* 48*  CREATININE 10.41* 6.76* 7.83*  CALCIUM 8.1* 7.7* 7.7*  PHOS  --  5.2* 7.5*   CBC  Recent Labs Lab 07/28/16 1032 07/30/16 0353  WBC 15.7* 9.7  HGB 8.9* 8.2*  HCT 27.3* 25.3*  MCV 88.3 87.5  PLT 218 221    Medications:     . atorvastatin  40 mg Oral QHS  .  ceFAZolin (ANCEF) IV  2 g Intravenous to XRAY  . cefTRIAXone (ROCEPHIN)  IV  2 g Intravenous Q24H  . DULoxetine  60 mg Oral Daily  . gabapentin  600 mg Oral QHS  . [START ON 08/02/2016] heparin  5,000 Units Subcutaneous Q8H  . insulin aspart  0-15 Units Subcutaneous TID WC  . insulin aspart  0-5 Units Subcutaneous QHS  . insulin glargine  18 Units Subcutaneous QHS  . kidney failure book   Does not apply Once  . sodium chloride flush  3 mL Intravenous Q12H      Otelia Santee, MD 08/01/2016, 8:00 AM

## 2016-08-01 NOTE — Progress Notes (Signed)
Interpreter Lesle Chris for Dr Eliseo Squires

## 2016-08-01 NOTE — Progress Notes (Signed)
PROGRESS NOTE                                                                                                                                                                                                             Patient Demographics:    Sarah Kelley, is a 66 y.o. female, DOB - 03/10/1951, FQH:[id number]  Admit date - 07/28/2016   Admitting Physician Vianne Bulls, MD  Outpatient Primary MD for the patient is Arnoldo Morale, MD  LOS - 4  Outpatient Specialists: renal  Chief Complaint  Patient presents with  . Altered Mental Status  . Fever       Brief Narrative   66 year old Hispanic female with chronic kidney disease stage V,, insulin-dependent diabetes mellitus, hypertension, depression who presented to ED with 2-3 days of fevers with confusion and hallucinations. History provided by family. She has a LUE fistula for future dialysis which has matured. Her husband was initially hesitant on dialysis but has now agreed. No documented fever at home, nausea, vomiting or diarrhea. She has been having intermittent hallucination and confusion, seeing people and describing bizarre events to the family that has never occurred. In the ED she was septic with fever of 101.51F, tachycardic. Chest x-ray showing  lobar opacity concerning for pneumonia. Labs showed BUN of 80 and creatinine of 10.4, bicarbonate of 18 and serum glucose of 297. WBC was elevated to 15.7 K hemoglobin of 8.9. Lactic acid will is normal, mildly elevated INR with UA showing proteinuria and microscopic hematuria. CT abdomen and pelvis given right upper quadrant pain showing small left and moderate right pleural effusion with bilateral lung base consolidation. (R>L) Patient was given 1 L normal saline bolus, cultures sent and placed on empiric antibiotics. Flu PCR was negative.    Subjective:   Denies further right upper quadrant pain or nausea.  Remains afebrile.   Assessment  & Plan :    Principal Problem: Sepsis secondary to lobar pneumonia.   Sepsis resolved. Blood cultures growing Streptococcus pneumoniae.  empiric Rocephin.. Encephalopathy resolved. Plan to treat for total 7 days of antibiotics. (Stop date 08/04/2016)  Active Problems:  Acute metabolic encephalopathy Secondary to sepsis with lobar pneumonia and uremia. Now resolved. Continue antibiotics.  Right upper quadrant abdominal pain with nausea Suspect secondary to pleurisy. CT abdomen and LFTs normal. Symptoms resolved.  Acute on chronic kidney disease stage V  LUL fistula placed recently and now hemodialysis dependent this admission. CLIP process initiated. Patient underwent tunneled HD catheter placement today for occluded AV fistula..  Nephrology following.     Normocytic anemia Secondary to chronic kidney disease. Stable at baseline.  Uncontrolled type 2 diabetes mellitus CBG stable. Reduced home dose Lantus since patient now dialysis dependent. Monitor on sliding scale coverage.    Depression Continue Cymbalta.    Essential hypertension Stable.       Code Status : Full code  Family Communication  : None at bedside  Disposition Plan  : pending CLIP for HD. Discharge home once outpatient dialysis setup.  Barriers For Discharge :  CLIP pending  Consults  :  Nephrology Intervention radiology  Procedures  :  CT abdomen and pelvis Hemodialysis Tunneled HD catheter placement  DVT Prophylaxis  : Subcutaneous heparin  Lab Results  Component Value Date   PLT 253 08/01/2016    Antibiotics  :  Anti-infectives    Start     Dose/Rate Route Frequency Ordered Stop   08/01/16 0645  ceFAZolin (ANCEF) IVPB 2g/100 mL premix     2 g 200 mL/hr over 30 Minutes Intravenous To Radiology 08/01/16 0638 08/01/16 1128   07/29/16 2100  cefTRIAXone (ROCEPHIN) 2 g in dextrose 5 % 50 mL IVPB     2 g 100 mL/hr over 30 Minutes Intravenous Every 24  hours 07/29/16 0608     07/29/16 0700  cefTRIAXone (ROCEPHIN) 1 g in dextrose 5 % 50 mL IVPB     1 g 100 mL/hr over 30 Minutes Intravenous NOW 07/29/16 0608 07/29/16 0710   07/28/16 2200  oseltamivir (TAMIFLU) capsule 30 mg     30 mg Oral  Once 07/28/16 1902 07/28/16 2129   07/28/16 1915  cefTRIAXone (ROCEPHIN) 1 g in dextrose 5 % 50 mL IVPB  Status:  Discontinued     1 g 100 mL/hr over 30 Minutes Intravenous Every 24 hours 07/28/16 1902 07/29/16 0608   07/28/16 1915  azithromycin (ZITHROMAX) 500 mg in dextrose 5 % 250 mL IVPB  Status:  Discontinued     500 mg 250 mL/hr over 60 Minutes Intravenous Every 24 hours 07/28/16 1902 07/29/16 0608   07/28/16 1430  piperacillin-tazobactam (ZOSYN) IVPB 3.375 g     3.375 g 100 mL/hr over 30 Minutes Intravenous  Once 07/28/16 1420 07/28/16 1652        Objective:   Vitals:   08/01/16 1115 08/01/16 1120 08/01/16 1124 08/01/16 1126  BP: (!) 151/59 (!) 148/55 104/77 124/68  Pulse: 90 100 (!) 101 (!) 105  Resp: 16 (!) 21 16 (!) 9  Temp:      TempSrc:      SpO2: 98% 95% 95% 97%  Weight:      Height:        Wt Readings from Last 3 Encounters:  07/31/16 75.8 kg (167 lb 1.7 oz)  06/12/16 78.6 kg (173 lb 3.2 oz)  03/17/16 73 kg (161 lb)    No intake or output data in the 24 hours ending 08/01/16 1212   Physical Exam  Gen: not in distress HEENT:  moist mucosa, supple neck Chest: Clear Breath sounds CVS: N S1&S2, no murmurs,  GI: soft, non Distended, nontender Musculoskeletal: warm, no edema, LUL AV graft     Data Review:    CBC  Recent Labs Lab 07/28/16 1032 07/30/16 0353 08/01/16 0843  WBC 15.7* 9.7 5.6  HGB 8.9*  8.2* 7.9*  HCT 27.3* 25.3* 24.6*  PLT 218 221 253  MCV 88.3 87.5 88.2  MCH 28.8 28.4 28.3  MCHC 32.6 32.4 32.1  RDW 15.1 14.9 14.3    Chemistries   Recent Labs Lab 07/28/16 1032 07/29/16 1612 07/30/16 0353 07/31/16 0529 08/01/16 0843  NA 138  --  137 137 136  K 4.9  --  3.9 4.3 4.5  CL 108  --   103 102 102  CO2 18*  --  24 24 23   GLUCOSE 297*  --  109* 72 92  BUN 80*  --  42* 48* 51*  CREATININE 10.41*  --  6.76* 7.83* 8.73*  CALCIUM 8.1*  --  7.7* 7.7* 7.7*  AST 19  --   --  25  --   ALT 13* 12*  --  14  --   ALKPHOS 129*  --   --  107  --   BILITOT 0.6  --   --  0.6  --    ------------------------------------------------------------------------------------------------------------------ No results for input(s): CHOL, HDL, LDLCALC, TRIG, CHOLHDL, LDLDIRECT in the last 72 hours.  Lab Results  Component Value Date   HGBA1C 9.8 06/12/2016   ------------------------------------------------------------------------------------------------------------------ No results for input(s): TSH, T4TOTAL, T3FREE, THYROIDAB in the last 72 hours.  Invalid input(s): FREET3 ------------------------------------------------------------------------------------------------------------------  Recent Labs  08/01/16 0843  FERRITIN 245  TIBC 167*  IRON 20*    Coagulation profile  Recent Labs Lab 07/28/16 1427  INR 1.36    No results for input(s): DDIMER in the last 72 hours.  Cardiac Enzymes No results for input(s): CKMB, TROPONINI, MYOGLOBIN in the last 168 hours.  Invalid input(s): CK ------------------------------------------------------------------------------------------------------------------ No results found for: BNP  Inpatient Medications  Scheduled Meds: . atorvastatin  40 mg Oral QHS  . cefTRIAXone (ROCEPHIN)  IV  2 g Intravenous Q24H  . DULoxetine  60 mg Oral Daily  . fentaNYL      . gabapentin  600 mg Oral QHS  . heparin      . [START ON 08/02/2016] heparin  5,000 Units Subcutaneous Q8H  . insulin aspart  0-15 Units Subcutaneous TID WC  . insulin aspart  0-5 Units Subcutaneous QHS  . insulin glargine  18 Units Subcutaneous QHS  . kidney failure book   Does not apply Once  . midazolam      . sodium chloride flush  3 mL Intravenous Q12H   Continuous  Infusions: PRN Meds:.sodium chloride, sodium chloride, sodium chloride, acetaminophen, alteplase, heparin, heparin, HYDROcodone-acetaminophen, lidocaine (PF), lidocaine-prilocaine, ondansetron (ZOFRAN) IV, pentafluoroprop-tetrafluoroeth, pneumococcal 23 valent vaccine, sodium chloride flush  Micro Results Recent Results (from the past 240 hour(s))  Culture, blood (Routine x 2)     Status: Abnormal   Collection Time: 07/28/16 10:42 AM  Result Value Ref Range Status   Specimen Description BLOOD RIGHT HAND  Final   Special Requests BOTTLES DRAWN AEROBIC AND ANAEROBIC  5CC  Final   Culture  Setup Time   Final    GRAM POSITIVE COCCI IN PAIRS IN BOTH AEROBIC AND ANAEROBIC BOTTLES CRITICAL RESULT CALLED TO, READ BACK BY AND VERIFIED WITH: Nicole Cella, PHARMD @[id number]  07/29/16 MKELLY,MLT    Culture STREPTOCOCCUS PNEUMONIAE (A)  Final   Report Status 07/31/2016 FINAL  Final   Organism ID, Bacteria STREPTOCOCCUS PNEUMONIAE  Final      Susceptibility   Streptococcus pneumoniae - MIC*    ERYTHROMYCIN <=0.12 SENSITIVE Sensitive     LEVOFLOXACIN 0.5 SENSITIVE Sensitive     PENICILLIN <=0.06 SENSITIVE  Sensitive     CEFTRIAXONE <=0.12 SENSITIVE Sensitive     * STREPTOCOCCUS PNEUMONIAE  Blood Culture ID Panel (Reflexed)     Status: Abnormal   Collection Time: 07/28/16 10:42 AM  Result Value Ref Range Status   Enterococcus species NOT DETECTED NOT DETECTED Final   Listeria monocytogenes NOT DETECTED NOT DETECTED Final   Staphylococcus species NOT DETECTED NOT DETECTED Final   Staphylococcus aureus NOT DETECTED NOT DETECTED Final   Streptococcus species DETECTED (A) NOT DETECTED Final    Comment: CRITICAL RESULT CALLED TO, READ BACK BY AND VERIFIED WITH: RUTH CLARK,PHARMD @0447  07/29/16 MKELLY,.MLT    Streptococcus agalactiae NOT DETECTED NOT DETECTED Final   Streptococcus pneumoniae DETECTED (A) NOT DETECTED Final    Comment: CRITICAL RESULT CALLED TO, READ BACK BY AND VERIFIED WITH: RUTH  CLARK,PHARMD @0447  07/29/16 MKELLY,MLT    Streptococcus pyogenes NOT DETECTED NOT DETECTED Final   Acinetobacter baumannii NOT DETECTED NOT DETECTED Final   Enterobacteriaceae species NOT DETECTED NOT DETECTED Final   Enterobacter cloacae complex NOT DETECTED NOT DETECTED Final   Escherichia coli NOT DETECTED NOT DETECTED Final   Klebsiella oxytoca NOT DETECTED NOT DETECTED Final   Klebsiella pneumoniae NOT DETECTED NOT DETECTED Final   Proteus species NOT DETECTED NOT DETECTED Final   Serratia marcescens NOT DETECTED NOT DETECTED Final   Haemophilus influenzae NOT DETECTED NOT DETECTED Final   Neisseria meningitidis NOT DETECTED NOT DETECTED Final   Pseudomonas aeruginosa NOT DETECTED NOT DETECTED Final   Candida albicans NOT DETECTED NOT DETECTED Final   Candida glabrata NOT DETECTED NOT DETECTED Final   Candida krusei NOT DETECTED NOT DETECTED Final   Candida parapsilosis NOT DETECTED NOT DETECTED Final   Candida tropicalis NOT DETECTED NOT DETECTED Final  Culture, blood (Routine x 2)     Status: Abnormal   Collection Time: 07/28/16  2:27 PM  Result Value Ref Range Status   Specimen Description BLOOD RIGHT HAND  Final   Special Requests IN PEDIATRIC BOTTLE  1CC  Final   Culture  Setup Time   Final    GRAM POSITIVE COCCI IN PAIRS IN PEDIATRIC BOTTLE CRITICAL VALUE NOTED.  VALUE IS CONSISTENT WITH PREVIOUSLY REPORTED AND CALLED VALUE.    Culture (A)  Final    STREPTOCOCCUS PNEUMONIAE SUSCEPTIBILITIES PERFORMED ON PREVIOUS CULTURE WITHIN THE LAST 5 DAYS.    Report Status 07/31/2016 FINAL  Final  Urine culture     Status: Abnormal   Collection Time: 07/28/16  5:09 PM  Result Value Ref Range Status   Specimen Description URINE, RANDOM  Final   Special Requests NONE  Final   Culture (A)  Final    20,000 COLONIES/mL MORGANELLA MORGANII 50,000 COLONIES/mL ESCHERICHIA COLI    Report Status 07/31/2016 FINAL  Final   Organism ID, Bacteria MORGANELLA MORGANII (A)  Final   Organism  ID, Bacteria ESCHERICHIA COLI (A)  Final      Susceptibility   Escherichia coli - MIC*    AMPICILLIN <=2 SENSITIVE Sensitive     CEFAZOLIN <=4 SENSITIVE Sensitive     CEFTRIAXONE <=1 SENSITIVE Sensitive     CIPROFLOXACIN <=0.25 SENSITIVE Sensitive     GENTAMICIN <=1 SENSITIVE Sensitive     IMIPENEM <=0.25 SENSITIVE Sensitive     NITROFURANTOIN <=16 SENSITIVE Sensitive     TRIMETH/SULFA <=20 SENSITIVE Sensitive     AMPICILLIN/SULBACTAM <=2 SENSITIVE Sensitive     PIP/TAZO <=4 SENSITIVE Sensitive     Extended ESBL NEGATIVE Sensitive     * 50,000 COLONIES/mL  ESCHERICHIA COLI   Morganella morganii - MIC*    AMPICILLIN >=32 RESISTANT Resistant     CEFAZOLIN >=64 RESISTANT Resistant     CEFTRIAXONE <=1 SENSITIVE Sensitive     CIPROFLOXACIN <=0.25 SENSITIVE Sensitive     GENTAMICIN <=1 SENSITIVE Sensitive     IMIPENEM 1 SENSITIVE Sensitive     NITROFURANTOIN 128 RESISTANT Resistant     TRIMETH/SULFA <=20 SENSITIVE Sensitive     AMPICILLIN/SULBACTAM >=32 RESISTANT Resistant     PIP/TAZO <=4 SENSITIVE Sensitive     * 20,000 COLONIES/mL MORGANELLA MORGANII  Respiratory Panel by PCR     Status: None   Collection Time: 07/29/16  7:32 AM  Result Value Ref Range Status   Adenovirus NOT DETECTED NOT DETECTED Final   Coronavirus 229E NOT DETECTED NOT DETECTED Final   Coronavirus HKU1 NOT DETECTED NOT DETECTED Final   Coronavirus NL63 NOT DETECTED NOT DETECTED Final   Coronavirus OC43 NOT DETECTED NOT DETECTED Final   Metapneumovirus NOT DETECTED NOT DETECTED Final   Rhinovirus / Enterovirus NOT DETECTED NOT DETECTED Final   Influenza A NOT DETECTED NOT DETECTED Final   Influenza B NOT DETECTED NOT DETECTED Final   Parainfluenza Virus 1 NOT DETECTED NOT DETECTED Final   Parainfluenza Virus 2 NOT DETECTED NOT DETECTED Final   Parainfluenza Virus 3 NOT DETECTED NOT DETECTED Final   Parainfluenza Virus 4 NOT DETECTED NOT DETECTED Final   Respiratory Syncytial Virus NOT DETECTED NOT DETECTED  Final   Bordetella pertussis NOT DETECTED NOT DETECTED Final   Chlamydophila pneumoniae NOT DETECTED NOT DETECTED Final   Mycoplasma pneumoniae NOT DETECTED NOT DETECTED Final    Radiology Reports Ct Abdomen Pelvis Wo Contrast  Result Date: 07/28/2016 CLINICAL DATA:  Back and abdominal pain. EXAM: CT ABDOMEN AND PELVIS WITHOUT CONTRAST TECHNIQUE: Multidetector CT imaging of the abdomen and pelvis was performed following the standard protocol without IV contrast. COMPARISON:  05/18/2015 FINDINGS: Lower chest: Calcified granuloma over the right middle lobe unchanged. Small left effusion and moderate size right pleural effusion. Associated consolidation over the right middle and lower lobes likely atelectasis although cannot exclude infection. Minimal left basilar atelectasis versus infection. Hepatobiliary: 3 mm calcification the region of the porta hepatis unchanged. Gallbladder, biliary tree and liver are otherwise unremarkable. Pancreas: Within normal. Spleen: Within normal. Adrenals/Urinary Tract: Adrenal glands are normal. Kidneys are normal in size without hydronephrosis, focal mass or nephrolithiasis. Ureters are normal. Bladder is mildly distended but otherwise unremarkable. Stomach/Bowel: Stomach and small bowel are within normal. Appendix is not visualized. Colon is within normal. Vascular/Lymphatic: Mild calcified plaque over the abdominal aorta and iliac arteries. No significant adenopathy. Reproductive: Uterus and adnexa are unremarkable. Other: Small ventral hernia over the midline below the umbilicus containing only peritoneal fat unchanged. 1.1 cm oval dense focus over the mesentery of the left lower quadrant likely small peritoneal loose body. Musculoskeletal: Mild degenerative change of the spine and hips. IMPRESSION: Small left effusion and moderate right pleural effusion. Consolidation over the lung bases right worse than left which may be due to atelectasis or infection. No evidence of  nephroureterolithiasis or obstruction. Small midline ventral hernia below the umbilicus containing only peritoneal fat. Electronically Signed   By: Marin Olp M.D.   On: 07/28/2016 16:53   Dg Chest 2 View  Result Date: 07/28/2016 CLINICAL DATA:  Fever.  Altered mental status.  Chest pain. EXAM: CHEST  2 VIEW COMPARISON:  12/31/2008 FINDINGS: The cardiac silhouette appears mildly enlarged, accentuated by low lung volumes. Aortic atherosclerosis  is noted. There is new mild diffuse interstitial accentuation. Airspace opacities are present in the right greater than left lung bases, and there small pleural effusions. No pneumothorax is identified. No acute osseous abnormality is seen. IMPRESSION: 1. Low lung volumes with right greater than left basilar airspace opacities, concerning for pneumonia although a combination of edema and atelectasis could also give a similar appearance. 2. Small pleural effusions. 3. Aortic atherosclerosis. Electronically Signed   By: Logan Bores M.D.   On: 07/28/2016 11:00   Ir Fluoro Guide Cv Line Right  Result Date: 08/01/2016 INDICATION: End-stage renal disease, poorly functioning left upper arm AV fistula. In need of durable intravenous access for initiation of dialysis. EXAM: TUNNELED CENTRAL VENOUS HEMODIALYSIS CATHETER PLACEMENT WITH ULTRASOUND AND FLUOROSCOPIC GUIDANCE MEDICATIONS: Ancef 2 gm IV . The antibiotic was given in an appropriate time interval prior to skin puncture. ANESTHESIA/SEDATION: Versed 2 mg IV; Fentanyl 75 mcg IV; Moderate Sedation Time:  15 The patient was continuously monitored during the procedure by the interventional radiology nurse under my direct supervision. FLUOROSCOPY TIME:  Fluoroscopy Time: 54 seconds (6 mGy). COMPLICATIONS: None immediate. PROCEDURE: Informed written consent was obtained from the patient after a discussion of the risks, benefits, and alternatives to treatment. Questions regarding the procedure were encouraged and answered.  The right neck and chest were prepped with chlorhexidine in a sterile fashion, and a sterile drape was applied covering the operative field. Maximum barrier sterile technique with sterile gowns and gloves were used for the procedure. A timeout was performed prior to the initiation of the procedure. After creating a small venotomy incision, a micropuncture kit was utilized to access the right internal jugular vein under direct, real-time ultrasound guidance after the overlying soft tissues were anesthetized with 1% lidocaine with epinephrine. Ultrasound image documentation was performed. The microwire was kinked to measure appropriate catheter length. A stiff Glidewire was advanced to the level of the IVC and the micropuncture sheath was exchanged for a peel-away sheath. A palindrome tunneled hemodialysis catheter measuring 23 cm from tip to cuff was tunneled in a retrograde fashion from the anterior chest wall to the venotomy incision. The catheter was then placed through the peel-away sheath with tips ultimately positioned within the superior aspect of the right atrium. Final catheter positioning was confirmed and documented with a spot radiographic image. The catheter aspirates and flushes normally. The catheter was flushed with appropriate volume heparin dwells. The catheter exit site was secured with a 0-Prolene retention suture. The venotomy incision was closed with an interrupted 4-0 Vicryl, Dermabond and Steri-strips. Dressings were applied. The patient tolerated the procedure well without immediate post procedural complication. IMPRESSION: Successful placement of 23 cm tip to cuff tunneled hemodialysis catheter via the right internal jugular vein with tips terminating within the superior aspect of the right atrium. The catheter is ready for immediate use. Electronically Signed   By: [name] M.D.   On: 08/01/2016 12:00   Ir US Guide Vasc Access Right  Result Date: 08/01/2016 INDICATION: End-stage  renal disease, poorly functioning left upper arm AV fistula. In need of durable intravenous access for initiation of dialysis. EXAM: TUNNELED CENTRAL VENOUS HEMODIALYSIS CATHETER PLACEMENT WITH ULTRASOUND AND FLUOROSCOPIC GUIDANCE MEDICATIONS: Ancef 2 gm IV . The antibiotic was given in an appropriate time interval prior to skin puncture. ANESTHESIA/SEDATION: Versed 2 mg IV; Fentanyl 75 mcg IV; Moderate Sedation Time:  15 The patient was continuously monitored during the procedure by the interventional radiology nurse under my direct supervision. FLUOROSCOPY  TIME:  Fluoroscopy Time: 54 seconds (6 mGy). COMPLICATIONS: None immediate. PROCEDURE: Informed written consent was obtained from the patient after a discussion of the risks, benefits, and alternatives to treatment. Questions regarding the procedure were encouraged and answered. The right neck and chest were prepped with chlorhexidine in a sterile fashion, and a sterile drape was applied covering the operative field. Maximum barrier sterile technique with sterile gowns and gloves were used for the procedure. A timeout was performed prior to the initiation of the procedure. After creating a small venotomy incision, a micropuncture kit was utilized to access the right internal jugular vein under direct, real-time ultrasound guidance after the overlying soft tissues were anesthetized with 1% lidocaine with epinephrine. Ultrasound image documentation was performed. The microwire was kinked to measure appropriate catheter length. A stiff Glidewire was advanced to the level of the IVC and the micropuncture sheath was exchanged for a peel-away sheath. A palindrome tunneled hemodialysis catheter measuring 23 cm from tip to cuff was tunneled in a retrograde fashion from the anterior chest wall to the venotomy incision. The catheter was then placed through the peel-away sheath with tips ultimately positioned within the superior aspect of the right atrium. Final catheter  positioning was confirmed and documented with a spot radiographic image. The catheter aspirates and flushes normally. The catheter was flushed with appropriate volume heparin dwells. The catheter exit site was secured with a 0-Prolene retention suture. The venotomy incision was closed with an interrupted 4-0 Vicryl, Dermabond and Steri-strips. Dressings were applied. The patient tolerated the procedure well without immediate post procedural complication. IMPRESSION: Successful placement of 23 cm tip to cuff tunneled hemodialysis catheter via the right internal jugular vein with tips terminating within the superior aspect of the right atrium. The catheter is ready for immediate use. Electronically Signed   By: [name] M.D.   On: 08/01/2016 12:00    Time Spent in minutes:25   Louellen Molder M.D on 08/01/2016 at 12:12 PM  Between 7am to 7pm - Pager - [phone]  After 7pm go to [email or web address] - password [facility]  [facility] -  Office  [phone]

## 2016-08-01 NOTE — Procedures (Signed)
Successful placement of tunneled HD catheter with tips terminating within the superior aspect of the right atrium.   EBL: None The patient tolerated the procedure well without immediate post procedural complication.  The catheter is ready for immediate use.   Ronny Bacon, MD Pager #: [phone]

## 2016-08-01 NOTE — Progress Notes (Signed)
Physical Therapy Treatment Patient Details Name: Sarah Kelley MRN: [id number] DOB: Dec 28, 1950 Today's Date: 08/01/2016    History of Present Illness 66 year old Hispanic female with chronic kidney disease stage V,, insulin-dependent diabetes mellitus, hypertension, depression who presented to ED with 2-3 days of fevers with confusion and hallucinations. Septic in ED; Pneumonia; new to HD, plan is to get her to Outpt HD    PT Comments    Pt required multiple attempts to wake up due to drowsiness post procedure. She was able to respond and participate with treatment to sit up EOB. Sit to stand performed with stedy with mod A to boost off EOB to change out sheets before going to dialysis. Pt would benefit from continued mobility and ambulation training to increase strength during stay.    Follow Up Recommendations  Home health PT;Supervision/Assistance - 24 hour     Equipment Recommendations  None recommended by PT    Recommendations for Other Services       Precautions / Restrictions Precautions Precautions: Fall Restrictions Weight Bearing Restrictions: No    Mobility  Bed Mobility Overal bed mobility: Needs Assistance Bed Mobility: Rolling;Supine to Sit Rolling: Min assist   Supine to sit: Mod assist     General bed mobility comments: min assist for rolling to adjust chux pad. Mod A + rail to elevate trunk from bed to EOB. Cues for hand placement  Transfers Overall transfer level: Needs assistance Equipment used:  (stedy) Transfers: Sit to/from Stand Sit to Stand: Mod assist            Ambulation/Gait             General Gait Details: unable to ambulate due to pt being sleepy after procedure.   Stairs            Wheelchair Mobility    Modified Rankin (Stroke Patients Only)       Balance                                    Cognition Arousal/Alertness: Lethargic;Suspect due to medications Behavior During Therapy:  Flat affect Overall Cognitive Status: Within Functional Limits for tasks assessed (pt able to answer questions but groggy from procedure)                 General Comments: spanish speaking, interpreter required    Exercises General Exercises - Lower Extremity Long Arc Quad: AROM;Seated;Both;5 reps Hip Flexion/Marching: AAROM;Seated;Both;5 reps    General Comments        Pertinent Vitals/Pain Pain Assessment: No/denies pain    Home Living                      Prior Function            PT Goals (current goals can now be found in the care plan section) Acute Rehab PT Goals Patient Stated Goal: none stated PT Goal Formulation: With patient/family Potential to Achieve Goals: Good Progress towards PT goals: Progressing toward goals (limited due to drowsiness.)    Frequency    Min 3X/week      PT Plan Current plan remains appropriate    Co-evaluation             End of Session Equipment Utilized During Treatment:  (used chux pad to raise off EOB) Activity Tolerance: Patient tolerated treatment well;Patient limited by lethargy Patient left: in bed;with call bell/phone within reach;with family/visitor  present;with bed alarm set     Time: 1225-1250 PT Time Calculation (min) (ACUTE ONLY): 25 min  Charges:  $Therapeutic Activity: 23-37 mins                    G Codes:      [facility] 2016/08/11, 2:37 PM [place], [place] Pager [phone]

## 2016-08-02 ENCOUNTER — Inpatient Hospital Stay (HOSPITAL_COMMUNITY): Payer: Medicaid Other

## 2016-08-02 DIAGNOSIS — F329 Major depressive disorder, single episode, unspecified: Secondary | ICD-10-CM

## 2016-08-02 DIAGNOSIS — E119 Type 2 diabetes mellitus without complications: Secondary | ICD-10-CM

## 2016-08-02 DIAGNOSIS — I10 Essential (primary) hypertension: Secondary | ICD-10-CM

## 2016-08-02 DIAGNOSIS — N186 End stage renal disease: Secondary | ICD-10-CM

## 2016-08-02 DIAGNOSIS — Z794 Long term (current) use of insulin: Secondary | ICD-10-CM

## 2016-08-02 DIAGNOSIS — J181 Lobar pneumonia, unspecified organism: Secondary | ICD-10-CM

## 2016-08-02 DIAGNOSIS — G9340 Encephalopathy, unspecified: Secondary | ICD-10-CM

## 2016-08-02 DIAGNOSIS — T82898A Other specified complication of vascular prosthetic devices, implants and grafts, initial encounter: Secondary | ICD-10-CM

## 2016-08-02 LAB — RENAL FUNCTION PANEL
Albumin: 1.4 g/dL — ABNORMAL LOW (ref 3.5–5.0)
Anion gap: 10 (ref 5–15)
BUN: 21 mg/dL — AB (ref 6–20)
CHLORIDE: 100 mmol/L — AB (ref 101–111)
CO2: 26 mmol/L (ref 22–32)
Calcium: 7.7 mg/dL — ABNORMAL LOW (ref 8.9–10.3)
Creatinine, Ser: 5.11 mg/dL — ABNORMAL HIGH (ref 0.44–1.00)
GFR calc Af Amer: 9 mL/min — ABNORMAL LOW (ref >60)
GFR calc non Af Amer: 8 mL/min — ABNORMAL LOW (ref >60)
GLUCOSE: 65 mg/dL (ref 65–99)
POTASSIUM: 3.8 mmol/L (ref 3.5–5.1)
Phosphorus: 6.5 mg/dL — ABNORMAL HIGH (ref 2.5–4.6)
Sodium: 136 mmol/L (ref 135–145)

## 2016-08-02 LAB — GLUCOSE, CAPILLARY
GLUCOSE-CAPILLARY: 130 mg/dL — AB (ref 65–99)
Glucose-Capillary: 63 mg/dL — ABNORMAL LOW (ref 65–99)
Glucose-Capillary: 83 mg/dL (ref 65–99)
Glucose-Capillary: 142 mg/dL — ABNORMAL HIGH (ref 65–99)
Glucose-Capillary: 106 mg/dL — ABNORMAL HIGH (ref 65–99)

## 2016-08-02 MED ORDER — INSULIN GLARGINE 100 UNIT/ML ~~LOC~~ SOLN
15.0000 [IU] | Freq: Every day | SUBCUTANEOUS | Status: DC
  Administered 2016-08-02 – 2016-08-04 (×2): 15 [IU] via SUBCUTANEOUS
  Filled 2016-08-02 (×3): qty 0.15

## 2016-08-02 MED ORDER — SODIUM CHLORIDE 0.9 % IV SOLN
250.0000 mg | Freq: Once | INTRAVENOUS | Status: DC
  Filled 2016-08-02: qty 20

## 2016-08-02 MED ORDER — DARBEPOETIN ALFA 60 MCG/0.3ML IJ SOSY: 60.0000 ug | PREFILLED_SYRINGE | Freq: Once | INTRAMUSCULAR | Status: DC

## 2016-08-02 NOTE — Progress Notes (Signed)
[facility] Progress Note    Assessment/ Plan:   1. CKD stage V / uremia - improved sp HD x 1 yest.  - Plan HD this afternoon after TC is placed(#2), start CLIP process Monday.  - great bruit in the left BCF but multiple nurses and techs were unable to cannulate the venous needle yesterday and on ultrasound bedside the vein is deep in long segments in the cannulation zone (>1cm). - We should have VVS consult for a 2nd opinion as she may need revision w/ superficialization. - Appreciate VIR placing the RIJ TC yesterday. 2. AMS - improving, from #1/#3 (uremia) 3. Pneumococcal PNA w bacteremia - on IV Rocephin now. Fever curve trending down and <99 deg past 2 days. 4. Anemia  - Will give single dose of Nulecit 266m x1 (with recent infection do not want to fully load) - Then will start ESA (? Response with recent infection) 5. UTI - morganella and ecoli both susc to rocephin. 6. HTN - BP's good, home BP med on hold 7. IDDM 8. Disp - not consistent PT bec pt is often at procedures. Really needs PT; she's not even getting out of bed.  Subjective:   Very weak but denies f/c/n/v.   Objective:   BP (!) 157/57 (BP Location: Right Arm)   Pulse 84   Temp 98.1 F (36.7 C)   Resp 18   Ht 5' 1"  (1.549 m)   Wt 74.6 kg (164 lb 7.4 oz)   LMP  (LMP Unknown)   SpO2 92%   BMI 31.08 kg/m   Intake/Output Summary (Last 24 hours) at 08/02/16 0804 Last data filed at 08/01/16 1833  Gross per 24 hour  Intake                0 ml  Output                0 ml  Net                0 ml   Weight change: -1.2 kg (-2 lb 10.3 oz)  Physical Exam: Gen elderly hispanic female, no asterixis, alert, answers questions No jvd or bruits Chest clear bilat, RIJ TC RRR no MRG Abd soft ntnd no mass or ascites +bs Ext no LEedema / L 3rd toe absent Neuro alert, nonfocal, responds appropriately LUA BCF+bruit augments well at inflow; difficult to palpate margins in the cannulation  zone   Imaging: Ir Fluoro Guide Cv Line Right  Result Date: 08/01/2016 INDICATION: End-stage renal disease, poorly functioning left upper arm AV fistula. In need of durable intravenous access for initiation of dialysis. EXAM: TUNNELED CENTRAL VENOUS HEMODIALYSIS CATHETER PLACEMENT WITH ULTRASOUND AND FLUOROSCOPIC GUIDANCE MEDICATIONS: Ancef 2 gm IV . The antibiotic was given in an appropriate time interval prior to skin puncture. ANESTHESIA/SEDATION: Versed 2 mg IV; Fentanyl 75 mcg IV; Moderate Sedation Time:  15 The patient was continuously monitored during the procedure by the interventional radiology nurse under my direct supervision. FLUOROSCOPY TIME:  Fluoroscopy Time: 54 seconds (6 mGy). COMPLICATIONS: None immediate. PROCEDURE: Informed written consent was obtained from the patient after a discussion of the risks, benefits, and alternatives to treatment. Questions regarding the procedure were encouraged and answered. The right neck and chest were prepped with chlorhexidine in a sterile fashion, and a sterile drape was applied covering the operative field. Maximum barrier sterile technique with sterile gowns and gloves were used for the procedure. A timeout was performed prior to the initiation of  the procedure. After creating a small venotomy incision, a micropuncture kit was utilized to access the right internal jugular vein under direct, real-time ultrasound guidance after the overlying soft tissues were anesthetized with 1% lidocaine with epinephrine. Ultrasound image documentation was performed. The microwire was kinked to measure appropriate catheter length. A stiff Glidewire was advanced to the level of the IVC and the micropuncture sheath was exchanged for a peel-away sheath. A palindrome tunneled hemodialysis catheter measuring 23 cm from tip to cuff was tunneled in a retrograde fashion from the anterior chest wall to the venotomy incision. The catheter was then placed through the peel-away  sheath with tips ultimately positioned within the superior aspect of the right atrium. Final catheter positioning was confirmed and documented with a spot radiographic image. The catheter aspirates and flushes normally. The catheter was flushed with appropriate volume heparin dwells. The catheter exit site was secured with a 0-Prolene retention suture. The venotomy incision was closed with an interrupted 4-0 Vicryl, Dermabond and Steri-strips. Dressings were applied. The patient tolerated the procedure well without immediate post procedural complication. IMPRESSION: Successful placement of 23 cm tip to cuff tunneled hemodialysis catheter via the right internal jugular vein with tips terminating within the superior aspect of the right atrium. The catheter is ready for immediate use. Electronically Signed   By: [name] M.D.   On: 08/01/2016 12:00   Ir US Guide Vasc Access Right  Result Date: 08/01/2016 INDICATION: End-stage renal disease, poorly functioning left upper arm AV fistula. In need of durable intravenous access for initiation of dialysis. EXAM: TUNNELED CENTRAL VENOUS HEMODIALYSIS CATHETER PLACEMENT WITH ULTRASOUND AND FLUOROSCOPIC GUIDANCE MEDICATIONS: Ancef 2 gm IV . The antibiotic was given in an appropriate time interval prior to skin puncture. ANESTHESIA/SEDATION: Versed 2 mg IV; Fentanyl 75 mcg IV; Moderate Sedation Time:  15 The patient was continuously monitored during the procedure by the interventional radiology nurse under my direct supervision. FLUOROSCOPY TIME:  Fluoroscopy Time: 54 seconds (6 mGy). COMPLICATIONS: None immediate. PROCEDURE: Informed written consent was obtained from the patient after a discussion of the risks, benefits, and alternatives to treatment. Questions regarding the procedure were encouraged and answered. The right neck and chest were prepped with chlorhexidine in a sterile fashion, and a sterile drape was applied covering the operative field. Maximum barrier  sterile technique with sterile gowns and gloves were used for the procedure. A timeout was performed prior to the initiation of the procedure. After creating a small venotomy incision, a micropuncture kit was utilized to access the right internal jugular vein under direct, real-time ultrasound guidance after the overlying soft tissues were anesthetized with 1% lidocaine with epinephrine. Ultrasound image documentation was performed. The microwire was kinked to measure appropriate catheter length. A stiff Glidewire was advanced to the level of the IVC and the micropuncture sheath was exchanged for a peel-away sheath. A palindrome tunneled hemodialysis catheter measuring 23 cm from tip to cuff was tunneled in a retrograde fashion from the anterior chest wall to the venotomy incision. The catheter was then placed through the peel-away sheath with tips ultimately positioned within the superior aspect of the right atrium. Final catheter positioning was confirmed and documented with a spot radiographic image. The catheter aspirates and flushes normally. The catheter was flushed with appropriate volume heparin dwells. The catheter exit site was secured with a 0-Prolene retention suture. The venotomy incision was closed with an interrupted 4-0 Vicryl, Dermabond and Steri-strips. Dressings were applied. The patient tolerated the procedure well without immediate  post procedural complication. IMPRESSION: Successful placement of 23 cm tip to cuff tunneled hemodialysis catheter via the right internal jugular vein with tips terminating within the superior aspect of the right atrium. The catheter is ready for immediate use. Electronically Signed   By: [name] M.D.   On: 08/01/2016 12:00    Labs: BMET  Recent Labs Lab 07/28/16 1032 07/30/16 0353 07/31/16 0529 08/01/16 0843  NA 138 137 137 136  K 4.9 3.9 4.3 4.5  CL 108 103 102 102  CO2 18* 24 24 23   GLUCOSE 297* 109* 72 92  BUN 80* 42* 48* 51*  CREATININE  10.41* 6.76* 7.83* 8.73*  CALCIUM 8.1* 7.7* 7.7* 7.7*  PHOS  --  5.2* 7.5* 8.6*   CBC  Recent Labs Lab 07/28/16 1032 07/30/16 0353 08/01/16 0843  WBC 15.7* 9.7 5.6  HGB 8.9* 8.2* 7.9*  HCT 27.3* 25.3* 24.6*  MCV 88.3 87.5 88.2  PLT 218 221 253    Medications:    . atorvastatin  40 mg Oral QHS  . cefTRIAXone (ROCEPHIN)  IV  2 g Intravenous Q24H  . DULoxetine  60 mg Oral Daily  . gabapentin  600 mg Oral QHS  . heparin  5,000 Units Subcutaneous Q8H  . insulin aspart  0-15 Units Subcutaneous TID WC  . insulin aspart  0-5 Units Subcutaneous QHS  . insulin glargine  18 Units Subcutaneous QHS  . kidney failure book   Does not apply Once  . sodium chloride flush  3 mL Intravenous Q12H      Otelia Santee, MD 08/02/2016, 8:04 AM

## 2016-08-02 NOTE — Progress Notes (Addendum)
Hypoglycemic Event  CBG: 62  Treatment: 15 GM carbohydrate snack  Symptoms: Shaky  Follow-up CBG: Time 0846:   CBG Result 83   Possible Reasons for Event: Inadequate meal intake  Comments/MD notified:Will notify    Sarah Kelley Margaretha Sheffield

## 2016-08-02 NOTE — Progress Notes (Addendum)
Inpatient Diabetes Program Recommendations  AACE/ADA: New Consensus Statement on Inpatient Glycemic Control (2015)  Target Ranges:  Prepandial:   less than 140 mg/dL      Peak postprandial:   less than 180 mg/dL (1-2 hours)      Critically ill patients:  140 - 180 mg/dL   Lab Results  Component Value Date   GLUCAP 83 08/02/2016   HGBA1C 9.8 06/12/2016   Results for LOURETTA, TANTILLO (MRN [id number]) as of 08/02/2016 08:50  Ref. Range 07/31/2016 08:53 07/31/2016 12:09 07/31/2016 17:23 07/31/2016 21:35 08/01/2016 07:33 08/01/2016 12:18 08/01/2016 21:11 08/02/2016 08:09 08/02/2016 08:46  Glucose-Capillary Latest Ref Range: 65 - 99 mg/dL 72 95 114 (H) 135 (H) 91 73 91 63 (L) 83   Current orders for Inpatient glycemic control:     Novolog 0-15 units TIDAC and 0-5 units QHS,     Lantus 18 units QHS  Inpatient Diabetes Program Recommendations:     Noted low CBG's over last 24 hours with no high CBG's.  Patient only received basal insulin of Lantus 18 units in 24 hours.     Please consider decreasing Lantus to 15 units daily.     Noted patient not eating well.       Please consider Glucerna supplements TID.  Thank you,  Windy Carina, RN, MSN Diabetes Coordinator Inpatient Diabetes Program [phone] (Team Pager)

## 2016-08-02 NOTE — Progress Notes (Signed)
Physical Therapy Treatment Patient Details Name: Sarah Kelley MRN: [id number] DOB: Apr 20, 1951 Today's Date: 08/02/2016    History of Present Illness 66 year old Hispanic female with chronic kidney disease stage V,, insulin-dependent diabetes mellitus, hypertension, depression who presented to ED with 2-3 days of fevers with confusion and hallucinations. Septic in ED; Pneumonia; new to HD, plan is to get her to Outpt HD    PT Comments    Pt progressed to standing and gait during today's session with increased pain in B calves.  Will continue to address functional mobility to improve strength as patient continues to improve.  Son present and able to translate in addition to [place] [employer] [place] interpreter.   Follow Up Recommendations  Home health PT;Supervision/Assistance - 24 hour     Equipment Recommendations  None recommended by PT    Recommendations for Other Services OT consult     Precautions / Restrictions Precautions Precautions: Fall Restrictions Weight Bearing Restrictions: No    Mobility  Bed Mobility Overal bed mobility: Needs Assistance Bed Mobility: Supine to Sit Rolling: Mod assist         General bed mobility comments: Cues for LE advance to edge of bed and mod assist to keep trunk elevated.    Transfers Overall transfer level: Needs assistance Equipment used: Rolling walker (2 wheeled) Transfers: Sit to/from Stand Sit to Stand: Mod assist;+2 physical assistance (Progressed to mod assist +1 when in standing.  )         General transfer comment: mod assist to boost into standing; cues fo rhand placement and safety.  pt presents with posterior lean and stool incontinence upon standing.    Ambulation/Gait Ambulation/Gait assistance: Mod assist;+2 physical assistance;+2 safety/equipment (progressed to Mod +1 with +2 for safety and equipment. ) Ambulation Distance (Feet): 26 Feet Assistive device: Rolling walker (2 wheeled) Gait  Pattern/deviations: Step-through pattern;Decreased stride length;Shuffle;Trunk flexed;Narrow base of support   Gait velocity interpretation: Below normal speed for age/gender General Gait Details: Cues for upper trunk control, cues for hand placement on RW, cues to increase B stride length.  Close chair follow for safety.     Stairs            Wheelchair Mobility    Modified Rankin (Stroke Patients Only)       Balance Overall balance assessment: Needs assistance   Sitting balance-Leahy Scale: Fair       Standing balance-Leahy Scale: Poor                      Cognition Arousal/Alertness: Awake/alert Behavior During Therapy: WFL for tasks assessed/performed Overall Cognitive Status: Within Functional Limits for tasks assessed                 General Comments: spanish speaking, interpreter required    Exercises      General Comments        Pertinent Vitals/Pain Pain Assessment: Faces Faces Pain Scale: Hurts little more Pain Location: B Lower LEs when weight bearing during walking.   Pain Descriptors / Indicators: Discomfort Pain Intervention(s): Monitored during session;Repositioned    Home Living                      Prior Function            PT Goals (current goals can now be found in the care plan section) Acute Rehab PT Goals Patient Stated Goal: none stated Potential to Achieve Goals: Good Progress towards PT goals: Progressing toward goals  Frequency    Min 3X/week      PT Plan Current plan remains appropriate    Co-evaluation             End of Session Equipment Utilized During Treatment: Gait belt Activity Tolerance: Patient tolerated treatment well;Patient limited by lethargy Patient left: with call bell/phone within reach;with family/visitor present;in chair;with chair alarm set     Time: 7096-2836 PT Time Calculation (min) (ACUTE ONLY): 34 min  Charges:  $Gait Training: 8-22 mins $Therapeutic  Activity: 8-22 mins                    G Codes:      Cristela Blue Aug 08, 2016, 2:25 PM Governor Rooks, PTA pager [phone]

## 2016-08-02 NOTE — Progress Notes (Signed)
[facility]                                                                              Patient Demographics  Sarah Kelley, is a 66 y.o. female, DOB - [birth year], MBO:[id number]  Admit date - 07/28/2016   Admitting Physician Vianne Bulls, MD  Outpatient Primary MD for the patient is Arnoldo Morale, MD  Outpatient specialists:   LOS - 5  days    Chief Complaint  Patient presents with  . Altered Mental Status  . Fever       Brief summary   66 year old Hispanic female with chronic kidney disease stage V,, insulin-dependent diabetes mellitus, hypertension, depression who presented to ED with 2-3 days of fevers with confusion and hallucinations. History provided by family. She has a LUE fistula for future dialysis which has matured. Her husband was initially hesitant on dialysis but has now agreed. No documented fever at home, nausea, vomiting or diarrhea. She has been having intermittent hallucination and confusion, seeing people and describing bizarre events to the family that has never occurred. In the ED she was septic with fever of 101.8F, tachycardic. Chest x-ray showing  lobar opacity concerning for pneumonia. Labs showed BUN of 80 and creatinine of 10.4, bicarbonate of 18 and serum glucose of 297. WBC was elevated to 15.7 K hemoglobin of 8.9. Lactic acid will is normal, mildly elevated INR with UA showing proteinuria and microscopic hematuria. CT abdomen and pelvis given right upper quadrant pain showing small left and moderate right pleural effusion with bilateral lung base consolidation. (R>L) Patient was given 1 L normal saline bolus, cultures sent and placed on empiric antibiotics. Flu PCR was negative.   Assessment & Plan    Principal Problem: Sepsis secondary to lobar pneumonia.   - Sepsis physiology resolved.  - Blood cultures growing Streptococcus pneumoniae. - Repeat blood cultures 1/30 negative so far - Continue empiric Rocephin, 7 days,  stop date 2/2  Active Problems:  Acute metabolic encephalopathy - Resolved Secondary to sepsis with lobar pneumonia and uremia, UTI. -  Continue antibiotics.  UTI -  urine culture showed Escherichia coli, Morganella, sensitive to ceftriaxone, negative ESBL   Right upper quadrant abdominal pain with nausea - Suspect secondary to pleurisy. CT abdomen and LFTs normal. -  Symptoms resolved.  Acute on chronic kidney disease stage V, ESRD now HD dependent -  LUL fistula placed recently and now hemodialysis dependent this admission. CLIP process initiated. - Patient underwent tunneled HD catheter placement on 1/30 for occluded AV fistula..  - Nephrology following    Normocytic anemia - Secondary to chronic kidney disease.  - Stable at baseline.  Uncontrolled type 2 diabetes mellitus -CBG stable.  - Decrease Lantus since patient now dialysis dependent.  - Monitor on sliding scale coverage.    Depression Continue Cymbalta.    Essential hypertension Stable.  Code Status: full  DVT Prophylaxis:  heparin  Family Communication: Discussed in detail with the patient, all imaging results, lab results explained to the patient and son at the bedside  Disposition Plan: Pending physical therapy and clip process  Time Spent in minutes 25 minutes  Procedures:  CT abdomen and pelvis Hemodialysis Tunneled HD catheter placement  Consultants:   Nephrology Interventional radiology  Antimicrobials:   IV Rocephin   Medications  Scheduled Meds: . atorvastatin  40 mg Oral QHS  . cefTRIAXone (ROCEPHIN)  IV  2 g Intravenous Q24H  . darbepoetin (ARANESP) injection - DIALYSIS  60 mcg Intravenous Once  . DULoxetine  60 mg Oral Daily  . ferric gluconate (FERRLECIT/NULECIT) IV  250 mg Intravenous Once  . gabapentin  600 mg Oral QHS  . heparin  5,000 Units Subcutaneous Q8H  . insulin aspart  0-15 Units Subcutaneous TID WC  . insulin aspart  0-5 Units Subcutaneous QHS  .  insulin glargine  15 Units Subcutaneous QHS  . kidney failure book   Does not apply Once  . sodium chloride flush  3 mL Intravenous Q12H   Continuous Infusions: PRN Meds:.sodium chloride, acetaminophen, HYDROcodone-acetaminophen, ondansetron (ZOFRAN) IV, pneumococcal 23 valent vaccine, sodium chloride flush   Antibiotics   Anti-infectives    Start     Dose/Rate Route Frequency Ordered Stop   08/01/16 0645  ceFAZolin (ANCEF) IVPB 2g/100 mL premix     2 g 200 mL/hr over 30 Minutes Intravenous To Radiology 08/01/16 0638 08/01/16 1128   07/29/16 2100  cefTRIAXone (ROCEPHIN) 2 g in dextrose 5 % 50 mL IVPB     2 g 100 mL/hr over 30 Minutes Intravenous Every 24 hours 07/29/16 0608     07/29/16 0700  cefTRIAXone (ROCEPHIN) 1 g in dextrose 5 % 50 mL IVPB     1 g 100 mL/hr over 30 Minutes Intravenous NOW 07/29/16 0608 07/29/16 0710   07/28/16 2200  oseltamivir (TAMIFLU) capsule 30 mg     30 mg Oral  Once 07/28/16 1902 07/28/16 2129   07/28/16 1915  cefTRIAXone (ROCEPHIN) 1 g in dextrose 5 % 50 mL IVPB  Status:  Discontinued     1 g 100 mL/hr over 30 Minutes Intravenous Every 24 hours 07/28/16 1902 07/29/16 0608   07/28/16 1915  azithromycin (ZITHROMAX) 500 mg in dextrose 5 % 250 mL IVPB  Status:  Discontinued     500 mg 250 mL/hr over 60 Minutes Intravenous Every 24 hours 07/28/16 1902 07/29/16 0608   07/28/16 1430  piperacillin-tazobactam (ZOSYN) IVPB 3.375 g     3.375 g 100 mL/hr over 30 Minutes Intravenous  Once 07/28/16 1420 07/28/16 1652        Subjective:   Sarah Kelley was seen and examined today.  Denies any specific complaints, review of systems obtained through the assistance with son at the bedside. Patient denies dizziness, chest pain, shortness of breath, abdominal pain, N/V/D/C, new weakness, numbess, tingling. No acute events overnight.  Still very weak  Objective:   Vitals:   08/01/16 1833 08/01/16 2112 08/02/16 0554 08/02/16 0713  BP: (!) 145/62 (!)  166/57 (!) 157/57   Pulse: 83 87 84   Resp: 15 18 18    Temp: 98.5 F (36.9 C) 97.8 F (36.6 C) 98.1 F (36.7 C)   TempSrc: Oral     SpO2: 100% 100% 100% 92%  Weight: 74.6 kg (164 lb 7.4 oz)     Height:        Intake/Output Summary (Last 24 hours) at 08/02/16 1123 Last data filed at 08/02/16 1013  Gross per 24 hour  Intake              360 ml  Output  350 ml  Net               10 ml     Wt Readings from Last 3 Encounters:  08/01/16 74.6 kg (164 lb 7.4 oz)  06/12/16 78.6 kg (173 lb 3.2 oz)  03/17/16 73 kg (161 lb)     Exam  General: Alert and oriented x 3, NAD  HEENT:  PERRLA, EOMI, Anicteric Sclera, mucous membranes moist.   Neck: Supple, no JVD, no masses, right IJ tunneled cath  Cardiovascular: S1 S2 auscultated, no rubs, murmurs or gallops. Regular rate and rhythm.  Respiratory: Clear to auscultation bilaterally, no wheezing, rales or rhonchi  Gastrointestinal: Soft, nontender, nondistended, + bowel sounds  Ext: no cyanosis clubbing or edema  Neuro: AAOx3, Cr N's II- XII. Strength 5/5 upper and lower extremities bilaterally  Skin: No rashes  Psych: Normal affect and demeanor, alert and oriented x3    Data Reviewed:  I have personally reviewed following labs and imaging studies  Micro Results Recent Results (from the past 240 hour(s))  Culture, blood (Routine x 2)     Status: Abnormal   Collection Time: 07/28/16 10:42 AM  Result Value Ref Range Status   Specimen Description BLOOD RIGHT HAND  Final   Special Requests BOTTLES DRAWN AEROBIC AND ANAEROBIC  5CC  Final   Culture  Setup Time   Final    GRAM POSITIVE COCCI IN PAIRS IN BOTH AEROBIC AND ANAEROBIC BOTTLES CRITICAL RESULT CALLED TO, READ BACK BY AND VERIFIED WITH: Nicole Cella, PHARMD @[id number]  07/29/16 MKELLY,MLT    Culture STREPTOCOCCUS PNEUMONIAE (A)  Final   Report Status 07/31/2016 FINAL  Final   Organism ID, Bacteria STREPTOCOCCUS PNEUMONIAE  Final      Susceptibility    Streptococcus pneumoniae - MIC*    ERYTHROMYCIN <=0.12 SENSITIVE Sensitive     LEVOFLOXACIN 0.5 SENSITIVE Sensitive     PENICILLIN <=0.06 SENSITIVE Sensitive     CEFTRIAXONE <=0.12 SENSITIVE Sensitive     * STREPTOCOCCUS PNEUMONIAE  Blood Culture ID Panel (Reflexed)     Status: Abnormal   Collection Time: 07/28/16 10:42 AM  Result Value Ref Range Status   Enterococcus species NOT DETECTED NOT DETECTED Final   Listeria monocytogenes NOT DETECTED NOT DETECTED Final   Staphylococcus species NOT DETECTED NOT DETECTED Final   Staphylococcus aureus NOT DETECTED NOT DETECTED Final   Streptococcus species DETECTED (A) NOT DETECTED Final    Comment: CRITICAL RESULT CALLED TO, READ BACK BY AND VERIFIED WITH: RUTH CLARK,PHARMD @0447  07/29/16 MKELLY,.MLT    Streptococcus agalactiae NOT DETECTED NOT DETECTED Final   Streptococcus pneumoniae DETECTED (A) NOT DETECTED Final    Comment: CRITICAL RESULT CALLED TO, READ BACK BY AND VERIFIED WITH: RUTH CLARK,PHARMD @0447  07/29/16 MKELLY,MLT    Streptococcus pyogenes NOT DETECTED NOT DETECTED Final   Acinetobacter baumannii NOT DETECTED NOT DETECTED Final   Enterobacteriaceae species NOT DETECTED NOT DETECTED Final   Enterobacter cloacae complex NOT DETECTED NOT DETECTED Final   Escherichia coli NOT DETECTED NOT DETECTED Final   Klebsiella oxytoca NOT DETECTED NOT DETECTED Final   Klebsiella pneumoniae NOT DETECTED NOT DETECTED Final   Proteus species NOT DETECTED NOT DETECTED Final   Serratia marcescens NOT DETECTED NOT DETECTED Final   Haemophilus influenzae NOT DETECTED NOT DETECTED Final   Neisseria meningitidis NOT DETECTED NOT DETECTED Final   Pseudomonas aeruginosa NOT DETECTED NOT DETECTED Final   Candida albicans NOT DETECTED NOT DETECTED Final   Candida glabrata NOT DETECTED NOT DETECTED Final  Candida krusei NOT DETECTED NOT DETECTED Final   Candida parapsilosis NOT DETECTED NOT DETECTED Final   Candida tropicalis NOT DETECTED NOT  DETECTED Final  Culture, blood (Routine x 2)     Status: Abnormal   Collection Time: 07/28/16  2:27 PM  Result Value Ref Range Status   Specimen Description BLOOD RIGHT HAND  Final   Special Requests IN PEDIATRIC BOTTLE  1CC  Final   Culture  Setup Time   Final    GRAM POSITIVE COCCI IN PAIRS IN PEDIATRIC BOTTLE CRITICAL VALUE NOTED.  VALUE IS CONSISTENT WITH PREVIOUSLY REPORTED AND CALLED VALUE.    Culture (A)  Final    STREPTOCOCCUS PNEUMONIAE SUSCEPTIBILITIES PERFORMED ON PREVIOUS CULTURE WITHIN THE LAST 5 DAYS.    Report Status 07/31/2016 FINAL  Final  Urine culture     Status: Abnormal   Collection Time: 07/28/16  5:09 PM  Result Value Ref Range Status   Specimen Description URINE, RANDOM  Final   Special Requests NONE  Final   Culture (A)  Final    20,000 COLONIES/mL MORGANELLA MORGANII 50,000 COLONIES/mL ESCHERICHIA COLI    Report Status 07/31/2016 FINAL  Final   Organism ID, Bacteria MORGANELLA MORGANII (A)  Final   Organism ID, Bacteria ESCHERICHIA COLI (A)  Final      Susceptibility   Escherichia coli - MIC*    AMPICILLIN <=2 SENSITIVE Sensitive     CEFAZOLIN <=4 SENSITIVE Sensitive     CEFTRIAXONE <=1 SENSITIVE Sensitive     CIPROFLOXACIN <=0.25 SENSITIVE Sensitive     GENTAMICIN <=1 SENSITIVE Sensitive     IMIPENEM <=0.25 SENSITIVE Sensitive     NITROFURANTOIN <=16 SENSITIVE Sensitive     TRIMETH/SULFA <=20 SENSITIVE Sensitive     AMPICILLIN/SULBACTAM <=2 SENSITIVE Sensitive     PIP/TAZO <=4 SENSITIVE Sensitive     Extended ESBL NEGATIVE Sensitive     * 50,000 COLONIES/mL ESCHERICHIA COLI   Morganella morganii - MIC*    AMPICILLIN >=32 RESISTANT Resistant     CEFAZOLIN >=64 RESISTANT Resistant     CEFTRIAXONE <=1 SENSITIVE Sensitive     CIPROFLOXACIN <=0.25 SENSITIVE Sensitive     GENTAMICIN <=1 SENSITIVE Sensitive     IMIPENEM 1 SENSITIVE Sensitive     NITROFURANTOIN 128 RESISTANT Resistant     TRIMETH/SULFA <=20 SENSITIVE Sensitive      AMPICILLIN/SULBACTAM >=32 RESISTANT Resistant     PIP/TAZO <=4 SENSITIVE Sensitive     * 20,000 COLONIES/mL MORGANELLA MORGANII  Respiratory Panel by PCR     Status: None   Collection Time: 07/29/16  7:32 AM  Result Value Ref Range Status   Adenovirus NOT DETECTED NOT DETECTED Final   Coronavirus 229E NOT DETECTED NOT DETECTED Final   Coronavirus HKU1 NOT DETECTED NOT DETECTED Final   Coronavirus NL63 NOT DETECTED NOT DETECTED Final   Coronavirus OC43 NOT DETECTED NOT DETECTED Final   Metapneumovirus NOT DETECTED NOT DETECTED Final   Rhinovirus / Enterovirus NOT DETECTED NOT DETECTED Final   Influenza A NOT DETECTED NOT DETECTED Final   Influenza B NOT DETECTED NOT DETECTED Final   Parainfluenza Virus 1 NOT DETECTED NOT DETECTED Final   Parainfluenza Virus 2 NOT DETECTED NOT DETECTED Final   Parainfluenza Virus 3 NOT DETECTED NOT DETECTED Final   Parainfluenza Virus 4 NOT DETECTED NOT DETECTED Final   Respiratory Syncytial Virus NOT DETECTED NOT DETECTED Final   Bordetella pertussis NOT DETECTED NOT DETECTED Final   Chlamydophila pneumoniae NOT DETECTED NOT DETECTED Final   Mycoplasma pneumoniae  NOT DETECTED NOT DETECTED Final    Radiology Reports Ct Abdomen Pelvis Wo Contrast  Result Date: 07/28/2016 CLINICAL DATA:  Back and abdominal pain. EXAM: CT ABDOMEN AND PELVIS WITHOUT CONTRAST TECHNIQUE: Multidetector CT imaging of the abdomen and pelvis was performed following the standard protocol without IV contrast. COMPARISON:  05/18/2015 FINDINGS: Lower chest: Calcified granuloma over the right middle lobe unchanged. Small left effusion and moderate size right pleural effusion. Associated consolidation over the right middle and lower lobes likely atelectasis although cannot exclude infection. Minimal left basilar atelectasis versus infection. Hepatobiliary: 3 mm calcification the region of the porta hepatis unchanged. Gallbladder, biliary tree and liver are otherwise unremarkable. Pancreas:  Within normal. Spleen: Within normal. Adrenals/Urinary Tract: Adrenal glands are normal. Kidneys are normal in size without hydronephrosis, focal mass or nephrolithiasis. Ureters are normal. Bladder is mildly distended but otherwise unremarkable. Stomach/Bowel: Stomach and small bowel are within normal. Appendix is not visualized. Colon is within normal. Vascular/Lymphatic: Mild calcified plaque over the abdominal aorta and iliac arteries. No significant adenopathy. Reproductive: Uterus and adnexa are unremarkable. Other: Small ventral hernia over the midline below the umbilicus containing only peritoneal fat unchanged. 1.1 cm oval dense focus over the mesentery of the left lower quadrant likely small peritoneal loose body. Musculoskeletal: Mild degenerative change of the spine and hips. IMPRESSION: Small left effusion and moderate right pleural effusion. Consolidation over the lung bases right worse than left which may be due to atelectasis or infection. No evidence of nephroureterolithiasis or obstruction. Small midline ventral hernia below the umbilicus containing only peritoneal fat. Electronically Signed   By: Marin Olp M.D.   On: 07/28/2016 16:53   Dg Chest 2 View  Result Date: 07/28/2016 CLINICAL DATA:  Fever.  Altered mental status.  Chest pain. EXAM: CHEST  2 VIEW COMPARISON:  12/31/2008 FINDINGS: The cardiac silhouette appears mildly enlarged, accentuated by low lung volumes. Aortic atherosclerosis is noted. There is new mild diffuse interstitial accentuation. Airspace opacities are present in the right greater than left lung bases, and there small pleural effusions. No pneumothorax is identified. No acute osseous abnormality is seen. IMPRESSION: 1. Low lung volumes with right greater than left basilar airspace opacities, concerning for pneumonia although a combination of edema and atelectasis could also give a similar appearance. 2. Small pleural effusions. 3. Aortic atherosclerosis.  Electronically Signed   By: Logan Bores M.D.   On: 07/28/2016 11:00   Ir Fluoro Guide Cv Line Right  Result Date: 08/01/2016 INDICATION: End-stage renal disease, poorly functioning left upper arm AV fistula. In need of durable intravenous access for initiation of dialysis. EXAM: TUNNELED CENTRAL VENOUS HEMODIALYSIS CATHETER PLACEMENT WITH ULTRASOUND AND FLUOROSCOPIC GUIDANCE MEDICATIONS: Ancef 2 gm IV . The antibiotic was given in an appropriate time interval prior to skin puncture. ANESTHESIA/SEDATION: Versed 2 mg IV; Fentanyl 75 mcg IV; Moderate Sedation Time:  15 The patient was continuously monitored during the procedure by the interventional radiology nurse under my direct supervision. FLUOROSCOPY TIME:  Fluoroscopy Time: 54 seconds (6 mGy). COMPLICATIONS: None immediate. PROCEDURE: Informed written consent was obtained from the patient after a discussion of the risks, benefits, and alternatives to treatment. Questions regarding the procedure were encouraged and answered. The right neck and chest were prepped with chlorhexidine in a sterile fashion, and a sterile drape was applied covering the operative field. Maximum barrier sterile technique with sterile gowns and gloves were used for the procedure. A timeout was performed prior to the initiation of the procedure. After creating a small  venotomy incision, a micropuncture kit was utilized to access the right internal jugular vein under direct, real-time ultrasound guidance after the overlying soft tissues were anesthetized with 1% lidocaine with epinephrine. Ultrasound image documentation was performed. The microwire was kinked to measure appropriate catheter length. A stiff Glidewire was advanced to the level of the IVC and the micropuncture sheath was exchanged for a peel-away sheath. A palindrome tunneled hemodialysis catheter measuring 23 cm from tip to cuff was tunneled in a retrograde fashion from the anterior chest wall to the venotomy incision.  The catheter was then placed through the peel-away sheath with tips ultimately positioned within the superior aspect of the right atrium. Final catheter positioning was confirmed and documented with a spot radiographic image. The catheter aspirates and flushes normally. The catheter was flushed with appropriate volume heparin dwells. The catheter exit site was secured with a 0-Prolene retention suture. The venotomy incision was closed with an interrupted 4-0 Vicryl, Dermabond and Steri-strips. Dressings were applied. The patient tolerated the procedure well without immediate post procedural complication. IMPRESSION: Successful placement of 23 cm tip to cuff tunneled hemodialysis catheter via the right internal jugular vein with tips terminating within the superior aspect of the right atrium. The catheter is ready for immediate use. Electronically Signed   By: [name] M.D.   On: 08/01/2016 12:00   Ir US Guide Vasc Access Right  Result Date: 08/01/2016 INDICATION: End-stage renal disease, poorly functioning left upper arm AV fistula. In need of durable intravenous access for initiation of dialysis. EXAM: TUNNELED CENTRAL VENOUS HEMODIALYSIS CATHETER PLACEMENT WITH ULTRASOUND AND FLUOROSCOPIC GUIDANCE MEDICATIONS: Ancef 2 gm IV . The antibiotic was given in an appropriate time interval prior to skin puncture. ANESTHESIA/SEDATION: Versed 2 mg IV; Fentanyl 75 mcg IV; Moderate Sedation Time:  15 The patient was continuously monitored during the procedure by the interventional radiology nurse under my direct supervision. FLUOROSCOPY TIME:  Fluoroscopy Time: 54 seconds (6 mGy). COMPLICATIONS: None immediate. PROCEDURE: Informed written consent was obtained from the patient after a discussion of the risks, benefits, and alternatives to treatment. Questions regarding the procedure were encouraged and answered. The right neck and chest were prepped with chlorhexidine in a sterile fashion, and a sterile drape was  applied covering the operative field. Maximum barrier sterile technique with sterile gowns and gloves were used for the procedure. A timeout was performed prior to the initiation of the procedure. After creating a small venotomy incision, a micropuncture kit was utilized to access the right internal jugular vein under direct, real-time ultrasound guidance after the overlying soft tissues were anesthetized with 1% lidocaine with epinephrine. Ultrasound image documentation was performed. The microwire was kinked to measure appropriate catheter length. A stiff Glidewire was advanced to the level of the IVC and the micropuncture sheath was exchanged for a peel-away sheath. A palindrome tunneled hemodialysis catheter measuring 23 cm from tip to cuff was tunneled in a retrograde fashion from the anterior chest wall to the venotomy incision. The catheter was then placed through the peel-away sheath with tips ultimately positioned within the superior aspect of the right atrium. Final catheter positioning was confirmed and documented with a spot radiographic image. The catheter aspirates and flushes normally. The catheter was flushed with appropriate volume heparin dwells. The catheter exit site was secured with a 0-Prolene retention suture. The venotomy incision was closed with an interrupted 4-0 Vicryl, Dermabond and Steri-strips. Dressings were applied. The patient tolerated the procedure well without immediate post procedural complication. IMPRESSION: Successful placement  of 23 cm tip to cuff tunneled hemodialysis catheter via the right internal jugular vein with tips terminating within the superior aspect of the right atrium. The catheter is ready for immediate use. Electronically Signed   By: [name] M.D.   On: 08/01/2016 12:00    Lab Data:  CBC:  Recent Labs Lab 07/28/16 1032 07/30/16 0353 08/01/16 0843  WBC 15.7* 9.7 5.6  HGB 8.9* 8.2* 7.9*  HCT 27.3* 25.3* 24.6*  MCV 88.3 87.5 88.2  PLT 218 221  461   Basic Metabolic Panel:  Recent Labs Lab 07/28/16 1032 07/30/16 0353 07/31/16 0529 08/01/16 0843 08/02/16 0626  NA 138 137 137 136 136  K 4.9 3.9 4.3 4.5 3.8  CL 108 103 102 102 100*  CO2 18* 24 24 23 26   GLUCOSE 297* 109* 72 92 65  BUN 80* 42* 48* 51* 21*  CREATININE 10.41* 6.76* 7.83* 8.73* 5.11*  CALCIUM 8.1* 7.7* 7.7* 7.7* 7.7*  PHOS  --  5.2* 7.5* 8.6* 6.5*   GFR: Estimated Creatinine Clearance: 9.9 mL/min (by C-G formula based on SCr of 5.11 mg/dL (H)). Liver Function Tests:  Recent Labs Lab 07/28/16 1032 07/29/16 1612 07/30/16 0353 07/31/16 0529 08/01/16 0843 08/02/16 0626  AST 19  --   --  25  --   --   ALT 13* 12*  --  14  --   --   ALKPHOS 129*  --   --  107  --   --   BILITOT 0.6  --   --  0.6  --   --   PROT 5.4*  --   --  5.3*  --   --   ALBUMIN 2.0*  --  1.6* 1.6*  1.5* 1.3* 1.4*   No results for input(s): LIPASE, AMYLASE in the last 168 hours.  Recent Labs Lab 07/28/16 2001  AMMONIA 22   Coagulation Profile:  Recent Labs Lab 07/28/16 1427  INR 1.36   Cardiac Enzymes: No results for input(s): CKTOTAL, CKMB, CKMBINDEX, TROPONINI in the last 168 hours. BNP (last 3 results) No results for input(s): PROBNP in the last 8760 hours. HbA1C: No results for input(s): HGBA1C in the last 72 hours. CBG:  Recent Labs Lab 08/01/16 0733 08/01/16 1218 08/01/16 2111 08/02/16 0809 08/02/16 0846  GLUCAP 91 73 91 63* 83   Lipid Profile: No results for input(s): CHOL, HDL, LDLCALC, TRIG, CHOLHDL, LDLDIRECT in the last 72 hours. Thyroid Function Tests: No results for input(s): TSH, T4TOTAL, FREET4, T3FREE, THYROIDAB in the last 72 hours. Anemia Panel:  Recent Labs  08/01/16 0843  FERRITIN 245  TIBC 167*  IRON 20*   Urine analysis:    Component Value Date/Time   COLORURINE YELLOW 07/28/2016 1709   APPEARANCEUR HAZY (A) 07/28/2016 1709   LABSPEC 1.016 07/28/2016 1709   PHURINE 6.0 07/28/2016 1709   GLUCOSEU >=500 (A) 07/28/2016  1709   HGBUR SMALL (A) 07/28/2016 1709   BILIRUBINUR NEGATIVE 07/28/2016 1709   BILIRUBINUR neg 03/10/2016 1121   KETONESUR 5 (A) 07/28/2016 1709   PROTEINUR >=300 (A) 07/28/2016 1709   UROBILINOGEN 0.2 03/10/2016 1121   UROBILINOGEN 0.2 12/31/2008 1556   NITRITE NEGATIVE 07/28/2016 1709   LEUKOCYTESUR NEGATIVE 07/28/2016 1709     Makell Drohan M.D. [facility] 08/02/2016, 11:23 AM  Pager: [phone] Between 7am to 7pm - call Pager - [phone]  After 7pm go to [email or web address] - password [email or web address]  Call night coverage person covering after 7pm

## 2016-08-02 NOTE — Progress Notes (Signed)
*  PRELIMINARY RESULTS* Vascular Ultrasound Duplex Dialysis Access (AVF, AGV) has been completed.   The fistula is patent, with a diameter of 7.53mm, and a depth of 6.61mm.  The outflow vein just above the fistula measures 5.28mm in diameter and 2.80mm depth.  In the distal upper arm the outflow vein measures 4.14mm in diameter and 14.69mm depth.  In the mid upper arm the outflow vein measures 5.60mm in diameter, and 11.25mm depth.  In the proximal upper arm the outflow vein measures 5.32mm in diameter and 12.70mm depth.  08/02/2016 4:52 PM Maudry Mayhew, BS, RVT, RDCS, RDMS

## 2016-08-02 NOTE — Consult Note (Signed)
[facility] NOTE   MRN : [id number]  Reason for Consult: Difficult stick left UE AV fistula Referring Physician: Dr. Jimmy Footman   Patient does not speak english and her son acted as her interpreter.    History of Present Illness: 66 y/o Who is on HD via right IJ with attempts at sticking the right UE AV fistula with out success.  We have been asked to evaluate the fistula for revision due to depth.  The AV fistula was created by Dr. Donzetta Matters 03/28/2016.    According to the nephrology note 08/02/2016 "great bruit in the left BCF but multiple nurses and techs were unable to cannulate the venous needle yesterday and on ultrasound bedside the vein is deep in long segments in the cannulation zone (>1cm)."  She was admitted this visit for fever, confusion and hallucinations.  Prior to this admission she was not on HD with a baseline Cr of 3.9.  Admission Cr was 10.0.  ESRD with placement of HD tunneled catheter placement 08/01/2015.     Past medical history includes: hypercholesterolemia managed with Lipitor, HTN managed with valsatan and DM managed with Insulin.    Current Facility-Administered Medications  Medication Dose Route Frequency Provider Last Rate Last Dose  . 0.9 %  sodium chloride infusion  250 mL Intravenous PRN Vianne Bulls, MD      . acetaminophen (TYLENOL) tablet 650 mg  650 mg Oral Q6H PRN Vianne Bulls, MD   650 mg at 08/01/16 2132  . atorvastatin (LIPITOR) tablet 40 mg  40 mg Oral QHS Vianne Bulls, MD   40 mg at 08/01/16 2132  . cefTRIAXone (ROCEPHIN) 2 g in dextrose 5 % 50 mL IVPB  2 g Intravenous Q24H Hollace Kinnier Gower, PA-C   2 g at 08/01/16 2132  . Darbepoetin Alfa (ARANESP) injection 60 mcg  60 mcg Intravenous Once Dwana Melena, MD      . DULoxetine (CYMBALTA) DR capsule 60 mg  60 mg Oral Daily Vianne Bulls, MD   60 mg at 08/02/16 0824  . ferric gluconate (NULECIT) 250 mg in sodium chloride 0.9 % 100 mL IVPB  250 mg Intravenous Once Dwana Melena, MD      . gabapentin (NEURONTIN) capsule 600 mg  600 mg Oral QHS Ilene Qua Opyd, MD   600 mg at 08/01/16 2132  . heparin injection 5,000 Units  5,000 Units Subcutaneous Q8H Monia Sabal, PA-C   5,000 Units at 08/02/16 0550  . HYDROcodone-acetaminophen (NORCO/VICODIN) 5-325 MG per tablet 1-2 tablet  1-2 tablet Oral Q6H PRN Vianne Bulls, MD   1 tablet at 08/02/16 0824  . insulin aspart (novoLOG) injection 0-15 Units  0-15 Units Subcutaneous TID WC Vianne Bulls, MD   2 Units at 07/30/16 1318  . insulin aspart (novoLOG) injection 0-5 Units  0-5 Units Subcutaneous QHS Vianne Bulls, MD   4 Units at 07/28/16 2131  . insulin glargine (LANTUS) injection 18 Units  18 Units Subcutaneous QHS Nishant Dhungel, MD   18 Units at 08/01/16 2133  . kidney failure book   Does not apply Once Nishant Dhungel, MD      . ondansetron (ZOFRAN) injection 4 mg  4 mg Intravenous Q6H PRN Nishant Dhungel, MD   4 mg at 08/01/16 0322  . pneumococcal 23 valent vaccine (PNU-IMMUNE) injection 0.5 mL  0.5 mL Intramuscular Prior to discharge Nishant Dhungel, MD      . sodium chloride flush (  NS) 0.9 % injection 3 mL  3 mL Intravenous Q12H Ilene Qua Opyd, MD   3 mL at 08/02/16 1000  . sodium chloride flush (NS) 0.9 % injection 3 mL  3 mL Intravenous PRN Vianne Bulls, MD        Pt meds include: Statin :Yes Betablocker: No ASA: No Other anticoagulants/antiplatelets: none  Past Medical History:  Diagnosis Date  . Diabetes mellitus   . Hyperlipidemia   . Hypertension   . Neuropathy ([place])    Diabetic  . Renal disorder   . Thyroid disease     Past Surgical History:  Procedure Laterality Date  . AMPUTATION Left 05/21/2015   Procedure: Left Foot 3rd Ray Amputation;  Surgeon: Newt Minion, MD;  Location: [place];  Service: Orthopedics;  Laterality: Left;  . AV FISTULA PLACEMENT Left 03/28/2016   Procedure: ARTERIOVENOUS (AV) FISTULA CREATION LEFT ARM;  Surgeon: Waynetta Sandy, MD;  Location: [place];   Service: Vascular;  Laterality: Left;  . DILATION AND CURETTAGE OF UTERUS    . IR GENERIC HISTORICAL  08/01/2016   IR FLUORO GUIDE CV LINE RIGHT 08/01/2016 [name], MD [facility]  . IR GENERIC HISTORICAL  08/01/2016   IR US GUIDE VASC ACCESS RIGHT 08/01/2016 [name], MD [facility]    Social History Social History  Substance Use Topics  . Smoking status: Never Smoker  . Smokeless tobacco: Never Used  . Alcohol use No     Comment: rare- a beer every now and then    Family History History reviewed. No pertinent family history.  Family history unknow  No Known Allergies   REVIEW OF SYSTEMS  General: [ ]  Weight loss, [ ]  Fever, [ ]  chills Neurologic: [ ]  Dizziness, [ ]  Blackouts, [ ]  Seizure [ ]  Stroke, [ ]  "Mini stroke", [ ]  Slurred speech, [ ]  Temporary blindness; [ ]  weakness in arms or legs, [ ]  Hoarseness [ ]  Dysphagia Cardiac: [ ]  Chest pain/pressure, [ ]  Shortness of breath at rest [ ]  Shortness of breath with exertion, [ ]  Atrial fibrillation or irregular heartbeat  Vascular: [ ]  Pain in legs with walking, [ ]  Pain in legs at rest, [ ]  Pain in legs at night,  [ ]  Non-healing ulcer, [ ]  Blood clot in vein/DVT,   Pulmonary: [ ]  Home oxygen, [ ]  Productive cough, [ ]  Coughing up blood, [ ]  Asthma,  [ ]  Wheezing [ ]  COPD Musculoskeletal:  [ ]  Arthritis, [ ]  Low back pain, [ ]  Joint pain Hematologic: [ ]  Easy Bruising, [ ]  Anemia; [ ]  Hepatitis Gastrointestinal: [ ]  Blood in stool, [ ]  Gastroesophageal Reflux/heartburn, Urinary: [ x] chronic Kidney disease, [x ] on HD - [ ]  MWF or [ ]  TTHS, [ ]  Burning with urination, [ ]  Difficulty urinating Skin: [ ]  Rashes, [ ]  Wounds Psychological: [ ]  Anxiety, [ ]  Depression  Physical Examination Vitals:   08/01/16 1833 08/01/16 2112 08/02/16 0554 08/02/16 0713  BP: (!) 145/62 (!) 166/57 (!) 157/57   Pulse: 83 87 84   Resp: 15 18 18    Temp: 98.5 F (36.9 C) 97.8 F (36.6 C) 98.1 F (36.7 C)   TempSrc: Oral     SpO2: 100%  100% 100% 92%  Weight: 164 lb 7.4 oz (74.6 kg)     Height:       Body mass index is 31.08 kg/m.  General:  WDWN in NADl HENT: WNL Eyes: Pupils equal Pulmonary: normal non-labored  breathing , without Rales, rhonchi,  wheezing Cardiac: RRR, without  Murmurs, rubs or gallops; No carotid bruits Abdomen: soft, NT, no masses Skin: no rashes, ulcers noted;  no Gangrene , no cellulitis; no open wounds;   Vascular Exam/Pulses:Palpable radial pulse, anastomosis and distal 1/4 of fistula palpable thrill.  Unable to palpate above this area.     Musculoskeletal: no muscle wasting or atrophy; no edema  Neurologic:  Appropriate Affect ;  SENSATION: normal; MOTOR FUNCTION: 5/5 Symmetric Speech is fluent/normal   Significant Diagnostic Studies: CBC Lab Results  Component Value Date   WBC 5.6 08/01/2016   HGB 7.9 (L) 08/01/2016   HCT 24.6 (L) 08/01/2016   MCV 88.2 08/01/2016   PLT 253 08/01/2016    BMET    Component Value Date/Time   NA 136 08/02/2016 0626   K 3.8 08/02/2016 0626   CL 100 (L) 08/02/2016 0626   CO2 26 08/02/2016 0626   GLUCOSE 65 08/02/2016 0626   BUN 21 (H) 08/02/2016 0626   CREATININE 5.11 (H) 08/02/2016 0626   CREATININE 3.97 (H) 01/13/2016 1051   CALCIUM 7.7 (L) 08/02/2016 0626   GFRNONAA 8 (L) 08/02/2016 0626   GFRNONAA 11 (L) 01/13/2016 1051   GFRAA 9 (L) 08/02/2016 0626   GFRAA 13 (L) 01/13/2016 1051   Estimated Creatinine Clearance: 9.9 mL/min (by C-G formula based on SCr of 5.11 mg/dL (H)).  COAG Lab Results  Component Value Date   INR 1.36 07/28/2016   INR 1.26 05/18/2015     Non-Invasive Vascular Imaging:  Will order fistula duplex  ASSESSMENT/PLAN:  ESRD CKD now AKI new HD patient this visit. We will order a fistula duplex and plan for superficialization of the fistula in the near future.   Laurence Slate [facility] 08/02/2016 10:41 AM  I have examined the patient, reviewed and agree with above.Discussed with the son present. Does have  the flow through her her left brachiocephalic fistula. This was placed by Dr.Cain in September 2012. Patient did not presents for follow-up. Will obtained duplex for further recommendations. Fistula does appear to run deep to the surface. May be a good candidate for superficial mobilization. Will make further recommendations pending duplex of her fistula  Curt Jews, MD 08/02/2016 2:47 PM

## 2016-08-03 ENCOUNTER — Inpatient Hospital Stay (HOSPITAL_COMMUNITY): Payer: Medicaid Other

## 2016-08-03 LAB — AMMONIA: AMMONIA: 35 umol/L (ref 9–35)

## 2016-08-03 LAB — RENAL FUNCTION PANEL
ANION GAP: 10 (ref 5–15)
Albumin: 1.5 g/dL — ABNORMAL LOW (ref 3.5–5.0)
BUN: 28 mg/dL — ABNORMAL HIGH (ref 6–20)
CALCIUM: 8.1 mg/dL — AB (ref 8.9–10.3)
CO2: 27 mmol/L (ref 22–32)
Chloride: 101 mmol/L (ref 101–111)
Creatinine, Ser: 6.65 mg/dL — ABNORMAL HIGH (ref 0.44–1.00)
GFR, EST AFRICAN AMERICAN: 7 mL/min — AB (ref >60)
GFR, EST NON AFRICAN AMERICAN: 6 mL/min — AB (ref >60)
Glucose, Bld: 82 mg/dL (ref 65–99)
Phosphorus: 7.9 mg/dL — ABNORMAL HIGH (ref 2.5–4.6)
Potassium: 3.8 mmol/L (ref 3.5–5.1)
Sodium: 138 mmol/L (ref 135–145)

## 2016-08-03 LAB — GLUCOSE, CAPILLARY
GLUCOSE-CAPILLARY: 127 mg/dL — AB (ref 65–99)
GLUCOSE-CAPILLARY: 76 mg/dL (ref 65–99)
Glucose-Capillary: 71 mg/dL (ref 65–99)
Glucose-Capillary: 71 mg/dL (ref 65–99)
Glucose-Capillary: 110 mg/dL — ABNORMAL HIGH (ref 65–99)
Glucose-Capillary: 82 mg/dL (ref 65–99)

## 2016-08-03 MED ORDER — DEXTROSE 50 % IV SOLN
INTRAVENOUS | Status: AC
  Filled 2016-08-03: qty 50

## 2016-08-03 MED ORDER — DEXTROSE 50 % IV SOLN
25.0000 mL | Freq: Once | INTRAVENOUS | Status: AC
  Administered 2016-08-03: 15 mL via INTRAVENOUS

## 2016-08-03 MED ORDER — CALCIUM ACETATE (PHOS BINDER) 667 MG PO CAPS
1334.0000 mg | ORAL_CAPSULE | Freq: Three times a day (TID) | ORAL | Status: DC
  Administered 2016-08-03 – 2016-08-04 (×3): 1334 mg via ORAL
  Filled 2016-08-03 (×3): qty 2

## 2016-08-03 NOTE — Progress Notes (Signed)
bladder scan shows only 92 mls in bladder, will monitor so we can in and out

## 2016-08-03 NOTE — Progress Notes (Signed)
[facility]                                                                              Patient Demographics  Sarah Kelley, is a 66 y.o. female, DOB - December 05, 1950, YYF:[id number]  Admit date - 07/28/2016   Admitting Physician Vianne Bulls, MD  Outpatient Primary MD for the patient is Arnoldo Morale, MD  Outpatient specialists:   LOS - 6  days    Chief Complaint  Patient presents with  . Altered Mental Status  . Fever       Brief summary   66 year old Hispanic female with chronic kidney disease stage V,, insulin-dependent diabetes mellitus, hypertension, depression who presented to ED with 2-3 days of fevers with confusion and hallucinations. History provided by family. She has a LUE fistula for future dialysis which has matured. Her husband was initially hesitant on dialysis but has now agreed. No documented fever at home, nausea, vomiting or diarrhea. She has been having intermittent hallucination and confusion, seeing people and describing bizarre events to the family that has never occurred. In the ED she was septic with fever of 101.36F, tachycardic. Chest x-ray showing  lobar opacity concerning for pneumonia. Labs showed BUN of 80 and creatinine of 10.4, bicarbonate of 18 and serum glucose of 297. WBC was elevated to 15.7 K hemoglobin of 8.9. Lactic acid will is normal, mildly elevated INR with UA showing proteinuria and microscopic hematuria. CT abdomen and pelvis given right upper quadrant pain showing small left and moderate right pleural effusion with bilateral lung base consolidation. (R>L) Patient was given 1 L normal saline bolus, cultures sent and placed on empiric antibiotics. Flu PCR was negative.   Assessment & Plan    Principal Problem: Sepsis secondary to lobar pneumonia.   - Sepsis physiology resolved.  - Blood cultures growing Streptococcus pneumoniae. - Repeat blood cultures 1/30 negative so far - Continue empiric Rocephin, 7 days,  stop date 2/2  Active Problems:  Acute metabolic encephalopathy - Still intermittently confused, worse after HD today - Stop Neurontin or narcotics. Obtain ammonia level, UA, urine culture, CT head - Secondary to sepsis with lobar pneumonia and uremia, UTI. - Continue antibiotics.  UTI -  urine culture showed Escherichia coli, Morganella, sensitive to ceftriaxone, negative ESBL   Right upper quadrant abdominal pain with nausea - Suspect secondary to pleurisy. CT abdomen and LFTs normal. -  Symptoms resolved.  Acute on chronic kidney disease stage V, ESRD now HD dependent -  LUL fistula placed recently and now hemodialysis dependent this admission. CLIP process initiated. - Patient underwent tunneled HD catheter placement on 1/30 for occluded AV fistula..  - Nephrology following    Normocytic anemia - Secondary to chronic kidney disease.  - Stable at baseline.  Uncontrolled type 2 diabetes mellitus -CBG stable.  - Decrease Lantus since patient now dialysis dependent.  - Monitor on sliding scale coverage.    Depression Continue Cymbalta.    Essential hypertension Stable.  Code Status: full  DVT Prophylaxis:  heparin  Family Communication: Discussed in detail with the patient, all imaging results, lab results explained to the patient and son at  the bedside  Disposition Plan: Pending physical therapy and clip process  Time Spent in minutes 25 minutes  Procedures:  CT abdomen and pelvis Hemodialysis Tunneled HD catheter placement  Consultants:   Nephrology Interventional radiology  Antimicrobials:   IV Rocephin   Medications  Scheduled Meds: . atorvastatin  40 mg Oral QHS  . calcium acetate  1,334 mg Oral TID WC  . cefTRIAXone (ROCEPHIN)  IV  2 g Intravenous Q24H  . darbepoetin (ARANESP) injection - DIALYSIS  60 mcg Intravenous Once  . DULoxetine  60 mg Oral Daily  . ferric gluconate (FERRLECIT/NULECIT) IV  250 mg Intravenous Once  . heparin   5,000 Units Subcutaneous Q8H  . insulin aspart  0-15 Units Subcutaneous TID WC  . insulin aspart  0-5 Units Subcutaneous QHS  . insulin glargine  15 Units Subcutaneous QHS  . kidney failure book   Does not apply Once  . sodium chloride flush  3 mL Intravenous Q12H   Continuous Infusions: PRN Meds:.sodium chloride, acetaminophen, HYDROcodone-acetaminophen, ondansetron (ZOFRAN) IV, pneumococcal 23 valent vaccine, sodium chloride flush   Antibiotics   Anti-infectives    Start     Dose/Rate Route Frequency Ordered Stop   08/01/16 0645  ceFAZolin (ANCEF) IVPB 2g/100 mL premix     2 g 200 mL/hr over 30 Minutes Intravenous To Radiology 08/01/16 0638 08/01/16 1128   07/29/16 2100  cefTRIAXone (ROCEPHIN) 2 g in dextrose 5 % 50 mL IVPB     2 g 100 mL/hr over 30 Minutes Intravenous Every 24 hours 07/29/16 0608     07/29/16 0700  cefTRIAXone (ROCEPHIN) 1 g in dextrose 5 % 50 mL IVPB     1 g 100 mL/hr over 30 Minutes Intravenous NOW 07/29/16 0608 07/29/16 0710   07/28/16 2200  oseltamivir (TAMIFLU) capsule 30 mg     30 mg Oral  Once 07/28/16 1902 07/28/16 2129   07/28/16 1915  cefTRIAXone (ROCEPHIN) 1 g in dextrose 5 % 50 mL IVPB  Status:  Discontinued     1 g 100 mL/hr over 30 Minutes Intravenous Every 24 hours 07/28/16 1902 07/29/16 0608   07/28/16 1915  azithromycin (ZITHROMAX) 500 mg in dextrose 5 % 250 mL IVPB  Status:  Discontinued     500 mg 250 mL/hr over 60 Minutes Intravenous Every 24 hours 07/28/16 1902 07/29/16 0608   07/28/16 1430  piperacillin-tazobactam (ZOSYN) IVPB 3.375 g     3.375 g 100 mL/hr over 30 Minutes Intravenous  Once 07/28/16 1420 07/28/16 1652        Subjective:   Sarah Kelley was seen and examined today. Weak, patient seen earlier this morning, no complaints, niece at the bedside. Patient denies dizziness, chest pain, shortness of breath, abdominal pain, N/V/D/C, new weakness, numbess, tingling. No acute events overnight. Later called by the nurse  that patient return after HD and somewhat confused.    Objective:   Vitals:   08/03/16 1200 08/03/16 1251 08/03/16 1326 08/03/16 1339  BP: (!) 110/46 (!) 110/47 (!) 94/40 (!) 120/48  Pulse: 78 94 99   Resp: 12 14 17    Temp:  98.4 F (36.9 C) 98.3 F (36.8 C)   TempSrc:  Oral Oral   SpO2: 100% 100% 96% 96%  Weight:      Height:        Intake/Output Summary (Last 24 hours) at 08/03/16 1458 Last data filed at 08/03/16 1251  Gross per 24 hour  Intake  0 ml  Output             1800 ml  Net            -1800 ml     Wt Readings from Last 3 Encounters:  08/03/16 73.3 kg (161 lb 9.6 oz)  06/12/16 78.6 kg (173 lb 3.2 oz)  03/17/16 73 kg (161 lb)     Exam  General: Alert and Awake  HEENT:    Neck: Supple, no JVD, no masses, right IJ tunneled cath  Cardiovascular: S1 S2 clear, RRR  Respiratory: Clear to auscultation bilaterally, no wheezing, rales or rhonchi  Gastrointestinal: Soft, nontender, nondistended, + bowel sounds  Ext: no cyanosis clubbing or edema  Neuro: AAOx3, Cr N's II- XII. Strength 5/5 upper and lower extremities bilaterally  Skin: No rashes  Psych: Normal affect and demeanor, alert and awake   Data Reviewed:  I have personally reviewed following labs and imaging studies  Micro Results Recent Results (from the past 240 hour(s))  Culture, blood (Routine x 2)     Status: Abnormal   Collection Time: 07/28/16 10:42 AM  Result Value Ref Range Status   Specimen Description BLOOD RIGHT HAND  Final   Special Requests BOTTLES DRAWN AEROBIC AND ANAEROBIC  5CC  Final   Culture  Setup Time   Final    GRAM POSITIVE COCCI IN PAIRS IN BOTH AEROBIC AND ANAEROBIC BOTTLES CRITICAL RESULT CALLED TO, READ BACK BY AND VERIFIED WITH: Nicole Cella, PHARMD @[id number]  07/29/16 MKELLY,MLT    Culture STREPTOCOCCUS PNEUMONIAE (A)  Final   Report Status 07/31/2016 FINAL  Final   Organism ID, Bacteria STREPTOCOCCUS PNEUMONIAE  Final      Susceptibility    Streptococcus pneumoniae - MIC*    ERYTHROMYCIN <=0.12 SENSITIVE Sensitive     LEVOFLOXACIN 0.5 SENSITIVE Sensitive     PENICILLIN <=0.06 SENSITIVE Sensitive     CEFTRIAXONE <=0.12 SENSITIVE Sensitive     * STREPTOCOCCUS PNEUMONIAE  Blood Culture ID Panel (Reflexed)     Status: Abnormal   Collection Time: 07/28/16 10:42 AM  Result Value Ref Range Status   Enterococcus species NOT DETECTED NOT DETECTED Final   Listeria monocytogenes NOT DETECTED NOT DETECTED Final   Staphylococcus species NOT DETECTED NOT DETECTED Final   Staphylococcus aureus NOT DETECTED NOT DETECTED Final   Streptococcus species DETECTED (A) NOT DETECTED Final    Comment: CRITICAL RESULT CALLED TO, READ BACK BY AND VERIFIED WITH: RUTH CLARK,PHARMD @0447  07/29/16 MKELLY,.MLT    Streptococcus agalactiae NOT DETECTED NOT DETECTED Final   Streptococcus pneumoniae DETECTED (A) NOT DETECTED Final    Comment: CRITICAL RESULT CALLED TO, READ BACK BY AND VERIFIED WITH: RUTH CLARK,PHARMD @0447  07/29/16 MKELLY,MLT    Streptococcus pyogenes NOT DETECTED NOT DETECTED Final   Acinetobacter baumannii NOT DETECTED NOT DETECTED Final   Enterobacteriaceae species NOT DETECTED NOT DETECTED Final   Enterobacter cloacae complex NOT DETECTED NOT DETECTED Final   Escherichia coli NOT DETECTED NOT DETECTED Final   Klebsiella oxytoca NOT DETECTED NOT DETECTED Final   Klebsiella pneumoniae NOT DETECTED NOT DETECTED Final   Proteus species NOT DETECTED NOT DETECTED Final   Serratia marcescens NOT DETECTED NOT DETECTED Final   Haemophilus influenzae NOT DETECTED NOT DETECTED Final   Neisseria meningitidis NOT DETECTED NOT DETECTED Final   Pseudomonas aeruginosa NOT DETECTED NOT DETECTED Final   Candida albicans NOT DETECTED NOT DETECTED Final   Candida glabrata NOT DETECTED NOT DETECTED Final   Candida krusei NOT DETECTED NOT DETECTED Final  Candida parapsilosis NOT DETECTED NOT DETECTED Final   Candida tropicalis NOT DETECTED NOT  DETECTED Final  Culture, blood (Routine x 2)     Status: Abnormal   Collection Time: 07/28/16  2:27 PM  Result Value Ref Range Status   Specimen Description BLOOD RIGHT HAND  Final   Special Requests IN PEDIATRIC BOTTLE  1CC  Final   Culture  Setup Time   Final    GRAM POSITIVE COCCI IN PAIRS IN PEDIATRIC BOTTLE CRITICAL VALUE NOTED.  VALUE IS CONSISTENT WITH PREVIOUSLY REPORTED AND CALLED VALUE.    Culture (A)  Final    STREPTOCOCCUS PNEUMONIAE SUSCEPTIBILITIES PERFORMED ON PREVIOUS CULTURE WITHIN THE LAST 5 DAYS.    Report Status 07/31/2016 FINAL  Final  Urine culture     Status: Abnormal   Collection Time: 07/28/16  5:09 PM  Result Value Ref Range Status   Specimen Description URINE, RANDOM  Final   Special Requests NONE  Final   Culture (A)  Final    20,000 COLONIES/mL MORGANELLA MORGANII 50,000 COLONIES/mL ESCHERICHIA COLI    Report Status 07/31/2016 FINAL  Final   Organism ID, Bacteria MORGANELLA MORGANII (A)  Final   Organism ID, Bacteria ESCHERICHIA COLI (A)  Final      Susceptibility   Escherichia coli - MIC*    AMPICILLIN <=2 SENSITIVE Sensitive     CEFAZOLIN <=4 SENSITIVE Sensitive     CEFTRIAXONE <=1 SENSITIVE Sensitive     CIPROFLOXACIN <=0.25 SENSITIVE Sensitive     GENTAMICIN <=1 SENSITIVE Sensitive     IMIPENEM <=0.25 SENSITIVE Sensitive     NITROFURANTOIN <=16 SENSITIVE Sensitive     TRIMETH/SULFA <=20 SENSITIVE Sensitive     AMPICILLIN/SULBACTAM <=2 SENSITIVE Sensitive     PIP/TAZO <=4 SENSITIVE Sensitive     Extended ESBL NEGATIVE Sensitive     * 50,000 COLONIES/mL ESCHERICHIA COLI   Morganella morganii - MIC*    AMPICILLIN >=32 RESISTANT Resistant     CEFAZOLIN >=64 RESISTANT Resistant     CEFTRIAXONE <=1 SENSITIVE Sensitive     CIPROFLOXACIN <=0.25 SENSITIVE Sensitive     GENTAMICIN <=1 SENSITIVE Sensitive     IMIPENEM 1 SENSITIVE Sensitive     NITROFURANTOIN 128 RESISTANT Resistant     TRIMETH/SULFA <=20 SENSITIVE Sensitive      AMPICILLIN/SULBACTAM >=32 RESISTANT Resistant     PIP/TAZO <=4 SENSITIVE Sensitive     * 20,000 COLONIES/mL MORGANELLA MORGANII  Respiratory Panel by PCR     Status: None   Collection Time: 07/29/16  7:32 AM  Result Value Ref Range Status   Adenovirus NOT DETECTED NOT DETECTED Final   Coronavirus 229E NOT DETECTED NOT DETECTED Final   Coronavirus HKU1 NOT DETECTED NOT DETECTED Final   Coronavirus NL63 NOT DETECTED NOT DETECTED Final   Coronavirus OC43 NOT DETECTED NOT DETECTED Final   Metapneumovirus NOT DETECTED NOT DETECTED Final   Rhinovirus / Enterovirus NOT DETECTED NOT DETECTED Final   Influenza A NOT DETECTED NOT DETECTED Final   Influenza B NOT DETECTED NOT DETECTED Final   Parainfluenza Virus 1 NOT DETECTED NOT DETECTED Final   Parainfluenza Virus 2 NOT DETECTED NOT DETECTED Final   Parainfluenza Virus 3 NOT DETECTED NOT DETECTED Final   Parainfluenza Virus 4 NOT DETECTED NOT DETECTED Final   Respiratory Syncytial Virus NOT DETECTED NOT DETECTED Final   Bordetella pertussis NOT DETECTED NOT DETECTED Final   Chlamydophila pneumoniae NOT DETECTED NOT DETECTED Final   Mycoplasma pneumoniae NOT DETECTED NOT DETECTED Final  Culture, blood (routine  x 2)     Status: None (Preliminary result)   Collection Time: 08/01/16  7:37 PM  Result Value Ref Range Status   Specimen Description BLOOD RIGHT WRIST  Final   Special Requests BOTTLES DRAWN AEROBIC AND ANAEROBIC 10CC EACH  Final   Culture NO GROWTH < 24 HOURS  Final   Report Status PENDING  Incomplete  Culture, blood (routine x 2)     Status: None (Preliminary result)   Collection Time: 08/01/16  7:40 PM  Result Value Ref Range Status   Specimen Description BLOOD RIGHT HAND  Final   Special Requests IN PEDIATRIC BOTTLE 3CC  Final   Culture NO GROWTH < 24 HOURS  Final   Report Status PENDING  Incomplete    Radiology Reports Ct Abdomen Pelvis Wo Contrast  Result Date: 07/28/2016 CLINICAL DATA:  Back and abdominal pain. EXAM: CT  ABDOMEN AND PELVIS WITHOUT CONTRAST TECHNIQUE: Multidetector CT imaging of the abdomen and pelvis was performed following the standard protocol without IV contrast. COMPARISON:  05/18/2015 FINDINGS: Lower chest: Calcified granuloma over the right middle lobe unchanged. Small left effusion and moderate size right pleural effusion. Associated consolidation over the right middle and lower lobes likely atelectasis although cannot exclude infection. Minimal left basilar atelectasis versus infection. Hepatobiliary: 3 mm calcification the region of the porta hepatis unchanged. Gallbladder, biliary tree and liver are otherwise unremarkable. Pancreas: Within normal. Spleen: Within normal. Adrenals/Urinary Tract: Adrenal glands are normal. Kidneys are normal in size without hydronephrosis, focal mass or nephrolithiasis. Ureters are normal. Bladder is mildly distended but otherwise unremarkable. Stomach/Bowel: Stomach and small bowel are within normal. Appendix is not visualized. Colon is within normal. Vascular/Lymphatic: Mild calcified plaque over the abdominal aorta and iliac arteries. No significant adenopathy. Reproductive: Uterus and adnexa are unremarkable. Other: Small ventral hernia over the midline below the umbilicus containing only peritoneal fat unchanged. 1.1 cm oval dense focus over the mesentery of the left lower quadrant likely small peritoneal loose body. Musculoskeletal: Mild degenerative change of the spine and hips. IMPRESSION: Small left effusion and moderate right pleural effusion. Consolidation over the lung bases right worse than left which may be due to atelectasis or infection. No evidence of nephroureterolithiasis or obstruction. Small midline ventral hernia below the umbilicus containing only peritoneal fat. Electronically Signed   By: Marin Olp M.D.   On: 07/28/2016 16:53   Dg Chest 2 View  Result Date: 07/28/2016 CLINICAL DATA:  Fever.  Altered mental status.  Chest pain. EXAM: CHEST  2  VIEW COMPARISON:  12/31/2008 FINDINGS: The cardiac silhouette appears mildly enlarged, accentuated by low lung volumes. Aortic atherosclerosis is noted. There is new mild diffuse interstitial accentuation. Airspace opacities are present in the right greater than left lung bases, and there small pleural effusions. No pneumothorax is identified. No acute osseous abnormality is seen. IMPRESSION: 1. Low lung volumes with right greater than left basilar airspace opacities, concerning for pneumonia although a combination of edema and atelectasis could also give a similar appearance. 2. Small pleural effusions. 3. Aortic atherosclerosis. Electronically Signed   By: Logan Bores M.D.   On: 07/28/2016 11:00   Ir Fluoro Guide Cv Line Right  Result Date: 08/01/2016 INDICATION: End-stage renal disease, poorly functioning left upper arm AV fistula. In need of durable intravenous access for initiation of dialysis. EXAM: TUNNELED CENTRAL VENOUS HEMODIALYSIS CATHETER PLACEMENT WITH ULTRASOUND AND FLUOROSCOPIC GUIDANCE MEDICATIONS: Ancef 2 gm IV . The antibiotic was given in an appropriate time interval prior to skin puncture. ANESTHESIA/SEDATION:  Versed 2 mg IV; Fentanyl 75 mcg IV; Moderate Sedation Time:  15 The patient was continuously monitored during the procedure by the interventional radiology nurse under my direct supervision. FLUOROSCOPY TIME:  Fluoroscopy Time: 54 seconds (6 mGy). COMPLICATIONS: None immediate. PROCEDURE: Informed written consent was obtained from the patient after a discussion of the risks, benefits, and alternatives to treatment. Questions regarding the procedure were encouraged and answered. The right neck and chest were prepped with chlorhexidine in a sterile fashion, and a sterile drape was applied covering the operative field. Maximum barrier sterile technique with sterile gowns and gloves were used for the procedure. A timeout was performed prior to the initiation of the procedure. After  creating a small venotomy incision, a micropuncture kit was utilized to access the right internal jugular vein under direct, real-time ultrasound guidance after the overlying soft tissues were anesthetized with 1% lidocaine with epinephrine. Ultrasound image documentation was performed. The microwire was kinked to measure appropriate catheter length. A stiff Glidewire was advanced to the level of the IVC and the micropuncture sheath was exchanged for a peel-away sheath. A palindrome tunneled hemodialysis catheter measuring 23 cm from tip to cuff was tunneled in a retrograde fashion from the anterior chest wall to the venotomy incision. The catheter was then placed through the peel-away sheath with tips ultimately positioned within the superior aspect of the right atrium. Final catheter positioning was confirmed and documented with a spot radiographic image. The catheter aspirates and flushes normally. The catheter was flushed with appropriate volume heparin dwells. The catheter exit site was secured with a 0-Prolene retention suture. The venotomy incision was closed with an interrupted 4-0 Vicryl, Dermabond and Steri-strips. Dressings were applied. The patient tolerated the procedure well without immediate post procedural complication. IMPRESSION: Successful placement of 23 cm tip to cuff tunneled hemodialysis catheter via the right internal jugular vein with tips terminating within the superior aspect of the right atrium. The catheter is ready for immediate use. Electronically Signed   By: [name] M.D.   On: 08/01/2016 12:00   Ir US Guide Vasc Access Right  Result Date: 08/01/2016 INDICATION: End-stage renal disease, poorly functioning left upper arm AV fistula. In need of durable intravenous access for initiation of dialysis. EXAM: TUNNELED CENTRAL VENOUS HEMODIALYSIS CATHETER PLACEMENT WITH ULTRASOUND AND FLUOROSCOPIC GUIDANCE MEDICATIONS: Ancef 2 gm IV . The antibiotic was given in an appropriate time  interval prior to skin puncture. ANESTHESIA/SEDATION: Versed 2 mg IV; Fentanyl 75 mcg IV; Moderate Sedation Time:  15 The patient was continuously monitored during the procedure by the interventional radiology nurse under my direct supervision. FLUOROSCOPY TIME:  Fluoroscopy Time: 54 seconds (6 mGy). COMPLICATIONS: None immediate. PROCEDURE: Informed written consent was obtained from the patient after a discussion of the risks, benefits, and alternatives to treatment. Questions regarding the procedure were encouraged and answered. The right neck and chest were prepped with chlorhexidine in a sterile fashion, and a sterile drape was applied covering the operative field. Maximum barrier sterile technique with sterile gowns and gloves were used for the procedure. A timeout was performed prior to the initiation of the procedure. After creating a small venotomy incision, a micropuncture kit was utilized to access the right internal jugular vein under direct, real-time ultrasound guidance after the overlying soft tissues were anesthetized with 1% lidocaine with epinephrine. Ultrasound image documentation was performed. The microwire was kinked to measure appropriate catheter length. A stiff Glidewire was advanced to the level of the IVC and the micropuncture sheath  was exchanged for a peel-away sheath. A palindrome tunneled hemodialysis catheter measuring 23 cm from tip to cuff was tunneled in a retrograde fashion from the anterior chest wall to the venotomy incision. The catheter was then placed through the peel-away sheath with tips ultimately positioned within the superior aspect of the right atrium. Final catheter positioning was confirmed and documented with a spot radiographic image. The catheter aspirates and flushes normally. The catheter was flushed with appropriate volume heparin dwells. The catheter exit site was secured with a 0-Prolene retention suture. The venotomy incision was closed with an interrupted 4-0  Vicryl, Dermabond and Steri-strips. Dressings were applied. The patient tolerated the procedure well without immediate post procedural complication. IMPRESSION: Successful placement of 23 cm tip to cuff tunneled hemodialysis catheter via the right internal jugular vein with tips terminating within the superior aspect of the right atrium. The catheter is ready for immediate use. Electronically Signed   By: [name] M.D.   On: 08/01/2016 12:00    Lab Data:  CBC:  Recent Labs Lab 07/28/16 1032 07/30/16 0353 08/01/16 0843  WBC 15.7* 9.7 5.6  HGB 8.9* 8.2* 7.9*  HCT 27.3* 25.3* 24.6*  MCV 88.3 87.5 88.2  PLT 218 221 845   Basic Metabolic Panel:  Recent Labs Lab 07/30/16 0353 07/31/16 0529 08/01/16 0843 08/02/16 0626 08/03/16 0418  NA 137 137 136 136 138  K 3.9 4.3 4.5 3.8 3.8  CL 103 102 102 100* 101  CO2 24 24 23 26 27   GLUCOSE 109* 72 92 65 82  BUN 42* 48* 51* 21* 28*  CREATININE 6.76* 7.83* 8.73* 5.11* 6.65*  CALCIUM 7.7* 7.7* 7.7* 7.7* 8.1*  PHOS 5.2* 7.5* 8.6* 6.5* 7.9*   GFR: Estimated Creatinine Clearance: 7.5 mL/min (by C-G formula based on SCr of 6.65 mg/dL (H)). Liver Function Tests:  Recent Labs Lab 07/28/16 1032 07/29/16 1612 07/30/16 0353 07/31/16 0529 08/01/16 0843 08/02/16 0626 08/03/16 0418  AST 19  --   --  25  --   --   --   ALT 13* 12*  --  14  --   --   --   ALKPHOS 129*  --   --  107  --   --   --   BILITOT 0.6  --   --  0.6  --   --   --   PROT 5.4*  --   --  5.3*  --   --   --   ALBUMIN 2.0*  --  1.6* 1.6*  1.5* 1.3* 1.4* 1.5*   No results for input(s): LIPASE, AMYLASE in the last 168 hours.  Recent Labs Lab 07/28/16 2001  AMMONIA 22   Coagulation Profile:  Recent Labs Lab 07/28/16 1427  INR 1.36   Cardiac Enzymes: No results for input(s): CKTOTAL, CKMB, CKMBINDEX, TROPONINI in the last 168 hours. BNP (last 3 results) No results for input(s): PROBNP in the last 8760 hours. HbA1C: No results for input(s): HGBA1C in the  last 72 hours. CBG:  Recent Labs Lab 08/02/16 1217 08/02/16 1715 08/02/16 2108 08/03/16 0750 08/03/16 1323  GLUCAP 142* 106* 130* 76 71   Lipid Profile: No results for input(s): CHOL, HDL, LDLCALC, TRIG, CHOLHDL, LDLDIRECT in the last 72 hours. Thyroid Function Tests: No results for input(s): TSH, T4TOTAL, FREET4, T3FREE, THYROIDAB in the last 72 hours. Anemia Panel:  Recent Labs  08/01/16 0843  FERRITIN 245  TIBC 167*  IRON 20*   Urine analysis:    Component  Value Date/Time   COLORURINE YELLOW 07/28/2016 1709   APPEARANCEUR HAZY (A) 07/28/2016 1709   LABSPEC 1.016 07/28/2016 1709   PHURINE 6.0 07/28/2016 1709   GLUCOSEU >=500 (A) 07/28/2016 1709   HGBUR SMALL (A) 07/28/2016 1709   BILIRUBINUR NEGATIVE 07/28/2016 1709   BILIRUBINUR neg 03/10/2016 1121   KETONESUR 5 (A) 07/28/2016 1709   PROTEINUR >=300 (A) 07/28/2016 1709   UROBILINOGEN 0.2 03/10/2016 1121   UROBILINOGEN 0.2 12/31/2008 1556   NITRITE NEGATIVE 07/28/2016 1709   LEUKOCYTESUR NEGATIVE 07/28/2016 1709     Ellin Fitzgibbons M.D. [facility] 08/03/2016, 2:58 PM  Pager: [phone] Between 7am to 7pm - call Pager - [phone]  After 7pm go to [email or web address] - password [email or web address]  Call night coverage person covering after 7pm

## 2016-08-03 NOTE — Progress Notes (Signed)
[facility] Progress Note    Assessment/ Plan:   1. CKD stage V / uremia - improved sp HD x 2 yest.  - Plan HD today #3,  CLIP processing.  - great bruit in the left BCF but multiple nurses and techs were unable to cannulate the venous needle and on ultrasound bedside the vein is deep in long segments in the cannulation zone (>1cm). - Appreciate VVS consult for a 2nd opinion   - duplex yesterday  - plan for office appt in 2 weeks to allow non active extravasation to settle before superficialization.  - Appreciate VIR placing the RIJ TC 08/01/16 (D#2). 2. AMS - improving, from #1/#3 (uremia) 3. Pneumococcal PNA w bacteremia - on IV Rocephin now. Fever curve trending down and <99 deg past 2 days. 4. Anemia  - Gave single dose of Nulecit 211m x1 (with recent infection do not want to fully load) - Then started ESA (? Response with recent infection) 5. UTI - morganella and ecoli both susc to rocephin. 6. HTN - BP's good, home BP med on hold 7. IDDM 8. Disp - not consistent PT bec pt is often at procedures. Really needs PT; she's not even getting out of bed. Educated daughter who was bedside today to at least help her to sit on side of bed q2hrs.  Subjective:   Very weak but denies f/c/n/v.   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 In need of durable intravenous access for initiation of dialysis. EXAM: TUNNELED CENTRAL VENOUS HEMODIALYSIS CATHETER PLACEMENT WITH ULTRASOUND AND FLUOROSCOPIC GUIDANCE MEDICATIONS: Ancef 2 gm IV . The antibiotic was given in an appropriate time interval prior to skin puncture. ANESTHESIA/SEDATION: Versed 2 mg IV; Fentanyl 75 mcg IV; Moderate Sedation Time:  15 The patient was continuously monitored during the procedure by the interventional radiology nurse under my direct supervision. FLUOROSCOPY TIME:  Fluoroscopy Time: 54 seconds (6 mGy). COMPLICATIONS: None immediate. PROCEDURE: Informed written consent was obtained from the patient after a discussion of the risks, benefits, and alternatives to treatment. Questions regarding the procedure were encouraged and answered. The right neck and chest were prepped with chlorhexidine in a sterile fashion, and a sterile drape was applied covering the operative field. Maximum barrier sterile technique with sterile gowns and gloves  were used for the procedure. A timeout was performed prior to the initiation of the procedure. After creating a small venotomy incision, a micropuncture kit was utilized to access the right internal jugular vein under direct, real-time ultrasound guidance after the overlying soft tissues were anesthetized with 1% lidocaine with epinephrine. Ultrasound image documentation was performed. The microwire was kinked to measure appropriate catheter length. A stiff Glidewire was advanced to the level of the IVC and the micropuncture sheath was exchanged for a peel-away sheath. A palindrome tunneled hemodialysis catheter measuring 23 cm from tip to cuff was tunneled in a retrograde fashion from the anterior chest wall  to the venotomy incision. The catheter was then placed through the peel-away sheath with tips ultimately positioned within the superior aspect of the right atrium. Final catheter positioning was confirmed and documented with a spot radiographic image. The catheter aspirates and flushes normally. The catheter was flushed with appropriate volume heparin dwells. The catheter exit site was secured with a 0-Prolene retention suture. The venotomy incision was closed with an interrupted 4-0 Vicryl, Dermabond and Steri-strips. Dressings were applied. The patient tolerated the procedure well without immediate post procedural complication. IMPRESSION: Successful placement of 23 cm tip to cuff tunneled hemodialysis catheter via the right internal jugular vein with tips terminating within the superior aspect of the right atrium. The catheter is ready for immediate use. Electronically Signed   By: [name] M.D.   On: 08/01/2016 12:00   Ir US Guide Vasc Access Right  Result Date: 08/01/2016 INDICATION: End-stage renal disease, poorly functioning left upper arm AV fistula. In need of durable intravenous access for initiation of dialysis. EXAM: TUNNELED CENTRAL VENOUS HEMODIALYSIS CATHETER PLACEMENT WITH ULTRASOUND AND FLUOROSCOPIC GUIDANCE MEDICATIONS: Ancef 2 gm IV . The antibiotic was given in an appropriate time interval prior to skin puncture. ANESTHESIA/SEDATION: Versed 2 mg IV; Fentanyl 75 mcg IV; Moderate Sedation Time:  15 The patient was continuously monitored during the procedure by the interventional radiology nurse under my direct supervision. FLUOROSCOPY TIME:  Fluoroscopy Time: 54 seconds (6 mGy). COMPLICATIONS: None immediate. PROCEDURE: Informed written consent was obtained from the patient after a discussion of the risks, benefits, and alternatives to treatment. Questions regarding the procedure were encouraged and answered. The right neck and chest were prepped with chlorhexidine in a sterile fashion, and  a sterile drape was applied covering the operative field. Maximum barrier sterile technique with sterile gowns and gloves were used for the procedure. A timeout was performed prior to the initiation of the procedure. After creating a small venotomy incision, a micropuncture kit was utilized to access the right internal jugular vein under direct, real-time ultrasound guidance after the overlying soft tissues were anesthetized with 1% lidocaine with epinephrine. Ultrasound image documentation was performed. The microwire was kinked to measure appropriate catheter length. A stiff Glidewire was advanced to the level of the IVC and the micropuncture sheath was exchanged for a peel-away sheath. A palindrome tunneled hemodialysis catheter measuring 23 cm from tip to cuff was tunneled in a retrograde fashion from the anterior chest wall to the venotomy incision. The catheter was then placed through the peel-away sheath with tips ultimately positioned within the superior aspect of the right atrium. Final catheter positioning was confirmed and documented with a spot radiographic image. The catheter aspirates and flushes normally. The catheter was flushed with appropriate volume heparin dwells. The catheter exit site was secured with a 0-Prolene retention suture. The venotomy incision was closed with an interrupted 4-0 Vicryl,  Dermabond and Steri-strips. Dressings were applied. The patient tolerated the procedure well without immediate post procedural complication. IMPRESSION: Successful placement of 23 cm tip to cuff tunneled hemodialysis catheter via the right internal jugular vein with tips terminating within the superior aspect of the right atrium. The catheter is ready for immediate use. Electronically Signed   By: [name] M.D.   On: 08/01/2016 12:00    Labs: BMET  Recent Labs Lab 07/28/16 1032 07/30/16 0353 07/31/16 0529 08/01/16 0843 08/02/16 0626 08/03/16 0418  NA 138 137 137 136 136 138  K 4.9 3.9  4.3 4.5 3.8 3.8  CL 108 103 102 102 100* 101  CO2 18* 24 24 23 26 27   GLUCOSE 297* 109* 72 92 65 82  BUN 80* 42* 48* 51* 21* 28*  CREATININE 10.41* 6.76* 7.83* 8.73* 5.11* 6.65*  CALCIUM 8.1* 7.7* 7.7* 7.7* 7.7* 8.1*  PHOS  --  5.2* 7.5* 8.6* 6.5* 7.9*   CBC  Recent Labs Lab 07/28/16 1032 07/30/16 0353 08/01/16 0843  WBC 15.7* 9.7 5.6  HGB 8.9* 8.2* 7.9*  HCT 27.3* 25.3* 24.6*  MCV 88.3 87.5 88.2  PLT 218 221 253    Medications:    . atorvastatin  40 mg Oral QHS  . calcium acetate  1,334 mg Oral TID WC  . cefTRIAXone (ROCEPHIN)  IV  2 g Intravenous Q24H  . darbepoetin (ARANESP) injection - DIALYSIS  60 mcg Intravenous Once  . DULoxetine  60 mg Oral Daily  . ferric gluconate (FERRLECIT/NULECIT) IV  250 mg Intravenous Once  . gabapentin  600 mg Oral QHS  . heparin  5,000 Units Subcutaneous Q8H  . insulin aspart  0-15 Units Subcutaneous TID WC  . insulin aspart  0-5 Units Subcutaneous QHS  . insulin glargine  15 Units Subcutaneous QHS  . kidney failure book   Does not apply Once  . sodium chloride flush  3 mL Intravenous Q12H      Otelia Santee, MD 08/03/2016, 8:16 AM

## 2016-08-03 NOTE — Progress Notes (Addendum)
      She  Has proximal hematoma surrounding the fistula from attempted sticks for HD.  We want this area to improve before we perform superficialization of the fistula.  We will have her f/u in our office in 2 weeks with Dr. Donzetta Matters for evaluation and planning.  Cont. HD via right IJ until we have a superificial fistula that is better accessible.   I spoke to her family member that is in the room.  COLLINS, EMMA MAUREEN PA-C

## 2016-08-04 DIAGNOSIS — D649 Anemia, unspecified: Secondary | ICD-10-CM

## 2016-08-04 LAB — BASIC METABOLIC PANEL
ANION GAP: 13 (ref 5–15)
BUN: 11 mg/dL (ref 6–20)
CALCIUM: 8.3 mg/dL — AB (ref 8.9–10.3)
CO2: 25 mmol/L (ref 22–32)
Chloride: 98 mmol/L — ABNORMAL LOW (ref 101–111)
Creatinine, Ser: 4.36 mg/dL — ABNORMAL HIGH (ref 0.44–1.00)
GFR calc Af Amer: 11 mL/min — ABNORMAL LOW (ref >60)
GFR, EST NON AFRICAN AMERICAN: 10 mL/min — AB (ref >60)
GLUCOSE: 89 mg/dL (ref 65–99)
Potassium: 4.2 mmol/L (ref 3.5–5.1)
Sodium: 136 mmol/L (ref 135–145)

## 2016-08-04 LAB — CBC
HCT: 27.6 % — ABNORMAL LOW (ref 36.0–46.0)
Hemoglobin: 8.9 g/dL — ABNORMAL LOW (ref 12.0–15.0)
MCH: 28.7 pg (ref 26.0–34.0)
MCHC: 32.2 g/dL (ref 30.0–36.0)
MCV: 89 fL (ref 78.0–100.0)
PLATELETS: 221 10*3/uL (ref 150–400)
RBC: 3.1 MIL/uL — ABNORMAL LOW (ref 3.87–5.11)
RDW: 14.7 % (ref 11.5–15.5)
WBC: 6.2 10*3/uL (ref 4.0–10.5)

## 2016-08-04 LAB — GLUCOSE, CAPILLARY
GLUCOSE-CAPILLARY: 90 mg/dL (ref 65–99)
GLUCOSE-CAPILLARY: 91 mg/dL (ref 65–99)

## 2016-08-04 MED ORDER — INSULIN GLARGINE 100 UNIT/ML ~~LOC~~ SOLN: 15.0000 [IU] | Freq: Every day | SUBCUTANEOUS | 3 refills | Status: DC

## 2016-08-04 MED ORDER — CALCIUM ACETATE (PHOS BINDER) 667 MG PO CAPS: 1334.0000 mg | ORAL_CAPSULE | Freq: Three times a day (TID) | ORAL | 3 refills | Status: DC

## 2016-08-04 NOTE — Progress Notes (Signed)
Interpreter Lesle Chris for Angelyn Punt PT

## 2016-08-04 NOTE — Progress Notes (Signed)
Physical Therapy Treatment Patient Details Name: Sarah Kelley MRN: [id number] DOB: 1950/10/14 Today's Date: 08/04/2016    History of Present Illness 66 year old Hispanic female with chronic kidney disease stage V,, insulin-dependent diabetes mellitus, hypertension, depression who presented to ED with 2-3 days of fevers with confusion and hallucinations. Septic in ED; Pneumonia; new to HD, plan is to get her to Outpt HD    PT Comments    Pt performed increased gait but presents with poor motivation to improve.  [name] present to encourage patient.  Pt found in stool and demonstrated stool incontinence again after ambulating in halls.  Continue to recommend 24 hour assistance at home.     Follow Up Recommendations  Home health PT;Supervision/Assistance - 24 hour     Equipment Recommendations  None recommended by PT    Recommendations for Other Services       Precautions / Restrictions      Mobility  Bed Mobility Overal bed mobility: Needs Assistance Bed Mobility: Supine to Sit     Supine to sit: Mod assist     General bed mobility comments: Cues for LE advancement to edge of bed and mod assist to keep trunk elevated.  Increased time allowed to for patient to perform without assistance but patient refused to move without assistance.    Transfers Overall transfer level: Needs assistance Equipment used: Rolling walker (2 wheeled) Transfers: Sit to/from Stand Sit to Stand: Mod assist         General transfer comment: Cues for hand placement to and from seated surface.  Pt able to follow commands for the most part.    Ambulation/Gait Ambulation/Gait assistance: Mod assist;+2 safety/equipment (for chair follow) Ambulation Distance (Feet): 120 Feet Assistive device: Rolling walker (2 wheeled) Gait Pattern/deviations: Step-through pattern;Decreased stride length;Shuffle;Trunk flexed;Narrow base of support   Gait velocity interpretation: Below normal speed  for age/gender General Gait Details: Cues for upper trunk control, cues for hand placement on RW, cues to increase B stride length.  Close chair follow for safety.  Pt with stool incontinence in hallway.     Stairs            Wheelchair Mobility    Modified Rankin (Stroke Patients Only)       Balance Overall balance assessment: Needs assistance   Sitting balance-Leahy Scale: Fair       Standing balance-Leahy Scale: Poor                      Cognition Arousal/Alertness: Awake/alert Behavior During Therapy: WFL for tasks assessed/performed Overall Cognitive Status: Within Functional Limits for tasks assessed                 General Comments: spanish speaking, interpreter required    Exercises      General Comments        Pertinent Vitals/Pain Pain Assessment: Faces Faces Pain Scale: Hurts little more Pain Location: generalized Pain Descriptors / Indicators: Discomfort Pain Intervention(s): Repositioned    Home Living                      Prior Function            PT Goals (current goals can now be found in the care plan section) Acute Rehab PT Goals Patient Stated Goal: none stated Potential to Achieve Goals: Good Progress towards PT goals: Progressing toward goals    Frequency    Min 3X/week      PT Plan Current  plan remains appropriate    Co-evaluation             End of Session Equipment Utilized During Treatment: Gait belt Activity Tolerance: Patient tolerated treatment well;Patient limited by lethargy Patient left: with call bell/phone within reach;with family/visitor present;in chair;with chair alarm set     Time: [phone] PT Time Calculation (min) (ACUTE ONLY): 42 min  Charges:  $Gait Training: 23-37 mins $Therapeutic Activity: 8-22 mins                    G Codes:      Sarah Kelley 08-22-16, 1:31 PM Sarah Kelley, PTA pager [phone]

## 2016-08-04 NOTE — Progress Notes (Signed)
         [facility]                                                                              Patient Demographics  Sarah Kelley, is a 66 y.o. female, DOB - 12/29/1950, MRN:[id number]  Admit date - 07/28/2016   Admitting Physician Timothy S Opyd, MD  Outpatient Primary MD for the patient is [name], [name], MD  Outpatient specialists:   LOS - 7  days    Chief Complaint  Patient presents with  . Altered Mental Status  . Fever       Brief summary   66-year-old Hispanic female with chronic kidney disease stage V,, insulin-dependent diabetes mellitus, hypertension, depression who presented to ED with 2-3 days of fevers with confusion and hallucinations. History provided by family. She has a LUE fistula for future dialysis which has matured. Her husband was initially hesitant on dialysis but has now agreed. No documented fever at home, nausea, vomiting or diarrhea. She has been having intermittent hallucination and confusion, seeing people and describing bizarre events to the family that has never occurred. In the ED she was septic with fever of 101.6F, tachycardic. Chest x-ray showing  lobar opacity concerning for pneumonia. Labs showed BUN of 80 and creatinine of 10.4, bicarbonate of 18 and serum glucose of 297. WBC was elevated to 15.7 K hemoglobin of 8.9. Lactic acid will is normal, mildly elevated INR with UA showing proteinuria and microscopic hematuria. CT abdomen and pelvis given right upper quadrant pain showing small left and moderate right pleural effusion with bilateral lung base consolidation. (R>L) Patient was given 1 L normal saline bolus, cultures sent and placed on empiric antibiotics. Flu PCR was negative.   Assessment & Plan    Principal Problem: Sepsis secondary to lobar pneumonia.   - Sepsis physiology resolved.  - Blood cultures growing Streptococcus pneumoniae. - Repeat blood cultures 1/30 negative so far - Continue empiric Rocephin, 7 days,  stop date 2/2  Active Problems:  Acute metabolic encephalopathy - Resolved, intermittently confused, yesterday worse after HD - CT head without any acute infarct. Ammonia level normal. - Neurontin, narcotics discontinued,   UTI -  urine culture showed Escherichia coli, Morganella, sensitive to ceftriaxone, negative ESBL   Right upper quadrant abdominal pain with nausea - Suspect secondary to pleurisy. CT abdomen and LFTs normal. -  Symptoms resolved.  Acute on chronic kidney disease stage V, ESRD now HD dependent -  LUL fistula placed recently and now hemodialysis dependent this admission. CLIP process initiated. - Patient underwent tunneled HD catheter placement on 1/30 for occluded AV fistula..  - Nephrology following    Normocytic anemia - Secondary to chronic kidney disease.  - Stable at baseline.  Uncontrolled type 2 diabetes mellitus -CBG stable.  - Decrease Lantus since patient now dialysis dependent.  - Monitor on sliding scale coverage.    Depression Continue Cymbalta.    Essential hypertension Stable.  Code Status: full  DVT Prophylaxis:  heparin  Family Communication: Discussed in detail with the patient, all imaging results, lab results explained to the patient and granddaughter at the bedside  Disposition Plan: Pending physical therapy and clip process    Time Spent in minutes 25 minutes  Procedures:  CT abdomen and pelvis Hemodialysis Tunneled HD catheter placement  Consultants:   Nephrology Interventional radiology  Antimicrobials:   IV Rocephin   Medications  Scheduled Meds: . atorvastatin  40 mg Oral QHS  . calcium acetate  1,334 mg Oral TID WC  . cefTRIAXone (ROCEPHIN)  IV  2 g Intravenous Q24H  . darbepoetin (ARANESP) injection - DIALYSIS  60 mcg Intravenous Once  . DULoxetine  60 mg Oral Daily  . ferric gluconate (FERRLECIT/NULECIT) IV  250 mg Intravenous Once  . heparin  5,000 Units Subcutaneous Q8H  . insulin aspart   0-15 Units Subcutaneous TID WC  . insulin aspart  0-5 Units Subcutaneous QHS  . insulin glargine  15 Units Subcutaneous QHS  . kidney failure book   Does not apply Once  . sodium chloride flush  3 mL Intravenous Q12H   Continuous Infusions: PRN Meds:.sodium chloride, acetaminophen, ondansetron (ZOFRAN) IV, pneumococcal 23 valent vaccine, sodium chloride flush   Antibiotics   Anti-infectives    Start     Dose/Rate Route Frequency Ordered Stop   08/01/16 0645  ceFAZolin (ANCEF) IVPB 2g/100 mL premix     2 g 200 mL/hr over 30 Minutes Intravenous To Radiology 08/01/16 0638 08/01/16 1128   07/29/16 2100  cefTRIAXone (ROCEPHIN) 2 g in dextrose 5 % 50 mL IVPB     2 g 100 mL/hr over 30 Minutes Intravenous Every 24 hours 07/29/16 0608     07/29/16 0700  cefTRIAXone (ROCEPHIN) 1 g in dextrose 5 % 50 mL IVPB     1 g 100 mL/hr over 30 Minutes Intravenous NOW 07/29/16 0608 07/29/16 0710   07/28/16 2200  oseltamivir (TAMIFLU) capsule 30 mg     30 mg Oral  Once 07/28/16 1902 07/28/16 2129   07/28/16 1915  cefTRIAXone (ROCEPHIN) 1 g in dextrose 5 % 50 mL IVPB  Status:  Discontinued     1 g 100 mL/hr over 30 Minutes Intravenous Every 24 hours 07/28/16 1902 07/29/16 0608   07/28/16 1915  azithromycin (ZITHROMAX) 500 mg in dextrose 5 % 250 mL IVPB  Status:  Discontinued     500 mg 250 mL/hr over 60 Minutes Intravenous Every 24 hours 07/28/16 1902 07/29/16 0608   07/28/16 1430  piperacillin-tazobactam (ZOSYN) IVPB 3.375 g     3.375 g 100 mL/hr over 30 Minutes Intravenous  Once 07/28/16 1420 07/28/16 1652        Subjective:   Clotilda Tessier was seen and examined today. No complaints today, alert and oriented 3, confirmed by the granddaughter at the bedside. Patient denies dizziness, chest pain, shortness of breath, abdominal pain, N/V/D/C, new weakness, numbess, tingling. No acute events overnight.   Objective:   Vitals:   08/03/16 1339 08/03/16 1736 08/03/16 2056 08/04/16 0520    BP: (!) 120/48 (!) 150/51 (!) 141/43 (!) 166/67  Pulse:   83 (!) 101  Resp:   18 18  Temp:   98.8 F (37.1 C) 98.2 F (36.8 C)  TempSrc:   Oral Oral  SpO2: 96% 94% 95% 92%  Weight:    72.3 kg (159 lb 6.3 oz)  Height:        Intake/Output Summary (Last 24 hours) at 08/04/16 1306 Last data filed at 08/04/16 0846  Gross per 24 hour  Intake               50 ml  Output  51 ml  Net               -1 ml     Wt Readings from Last 3 Encounters:  08/04/16 72.3 kg (159 lb 6.3 oz)  06/12/16 78.6 kg (173 lb 3.2 oz)  03/17/16 73 kg (161 lb)     Exam  General: Alert and Awake  HEENT:    Neck: Supple, no JVD, no masses, right IJ tunneled cath  Cardiovascular: S1 S2 clear, RRR  Respiratory: Clear to auscultation bilaterally, no wheezing, rales or rhonchi  Gastrointestinal: Soft, nontender, nondistended, + bowel sounds  Ext: no cyanosis clubbing or edema  Neuro:No new deficits  Skin: No rashes  Psych: Normal affect and demeanor, alert and awake   Data Reviewed:  I have personally reviewed following labs and imaging studies  Micro Results Recent Results (from the past 240 hour(s))  Culture, blood (Routine x 2)     Status: Abnormal   Collection Time: 07/28/16 10:42 AM  Result Value Ref Range Status   Specimen Description BLOOD RIGHT HAND  Final   Special Requests BOTTLES DRAWN AEROBIC AND ANAEROBIC  5CC  Final   Culture  Setup Time   Final    GRAM POSITIVE COCCI IN PAIRS IN BOTH AEROBIC AND ANAEROBIC BOTTLES CRITICAL RESULT CALLED TO, READ BACK BY AND VERIFIED WITH: Nicole Cella, PHARMD _0  07/29/16 MKELLY,MLT    Culture STREPTOCOCCUS PNEUMONIAE (A)  Final   Report Status 07/31/2016 FINAL  Final   Organism ID, Bacteria STREPTOCOCCUS PNEUMONIAE  Final      Susceptibility   Streptococcus pneumoniae - MIC*    ERYTHROMYCIN <=0.12 SENSITIVE Sensitive     LEVOFLOXACIN 0.5 SENSITIVE Sensitive     PENICILLIN <=0.06 SENSITIVE Sensitive     CEFTRIAXONE <=0.12  SENSITIVE Sensitive     * STREPTOCOCCUS PNEUMONIAE  Blood Culture ID Panel (Reflexed)     Status: Abnormal   Collection Time: 07/28/16 10:42 AM  Result Value Ref Range Status   Enterococcus species NOT DETECTED NOT DETECTED Final   Listeria monocytogenes NOT DETECTED NOT DETECTED Final   Staphylococcus species NOT DETECTED NOT DETECTED Final   Staphylococcus aureus NOT DETECTED NOT DETECTED Final   Streptococcus species DETECTED (A) NOT DETECTED Final    Comment: CRITICAL RESULT CALLED TO, READ BACK BY AND VERIFIED WITH: RUTH CLARK,PHARMD _1  07/29/16 MKELLY,.MLT    Streptococcus agalactiae NOT DETECTED NOT DETECTED Final   Streptococcus pneumoniae DETECTED (A) NOT DETECTED Final    Comment: CRITICAL RESULT CALLED TO, READ BACK BY AND VERIFIED WITH: RUTH CLARK,PHARMD _2  07/29/16 MKELLY,MLT    Streptococcus pyogenes NOT DETECTED NOT DETECTED Final   Acinetobacter baumannii NOT DETECTED NOT DETECTED Final   Enterobacteriaceae species NOT DETECTED NOT DETECTED Final   Enterobacter cloacae complex NOT DETECTED NOT DETECTED Final   Escherichia coli NOT DETECTED NOT DETECTED Final   Klebsiella oxytoca NOT DETECTED NOT DETECTED Final   Klebsiella pneumoniae NOT DETECTED NOT DETECTED Final   Proteus species NOT DETECTED NOT DETECTED Final   Serratia marcescens NOT DETECTED NOT DETECTED Final   Haemophilus influenzae NOT DETECTED NOT DETECTED Final   Neisseria meningitidis NOT DETECTED NOT DETECTED Final   Pseudomonas aeruginosa NOT DETECTED NOT DETECTED Final   Candida albicans NOT DETECTED NOT DETECTED Final   Candida glabrata NOT DETECTED NOT DETECTED Final   Candida krusei NOT DETECTED NOT DETECTED Final   Candida parapsilosis NOT DETECTED NOT DETECTED Final   Candida tropicalis NOT DETECTED NOT DETECTED Final  Culture, blood (Routine x  2)     Status: Abnormal   Collection Time: 07/28/16  2:27 PM  Result Value Ref Range Status   Specimen Description BLOOD RIGHT HAND  Final    Special Requests IN PEDIATRIC BOTTLE  1CC  Final   Culture  Setup Time   Final    GRAM POSITIVE COCCI IN PAIRS IN PEDIATRIC BOTTLE CRITICAL VALUE NOTED.  VALUE IS CONSISTENT WITH PREVIOUSLY REPORTED AND CALLED VALUE.    Culture (A)  Final    STREPTOCOCCUS PNEUMONIAE SUSCEPTIBILITIES PERFORMED ON PREVIOUS CULTURE WITHIN THE LAST 5 DAYS.    Report Status 07/31/2016 FINAL  Final  Urine culture     Status: Abnormal   Collection Time: 07/28/16  5:09 PM  Result Value Ref Range Status   Specimen Description URINE, RANDOM  Final   Special Requests NONE  Final   Culture (A)  Final    20,000 COLONIES/mL MORGANELLA MORGANII 50,000 COLONIES/mL ESCHERICHIA COLI    Report Status 07/31/2016 FINAL  Final   Organism ID, Bacteria MORGANELLA MORGANII (A)  Final   Organism ID, Bacteria ESCHERICHIA COLI (A)  Final      Susceptibility   Escherichia coli - MIC*    AMPICILLIN <=2 SENSITIVE Sensitive     CEFAZOLIN <=4 SENSITIVE Sensitive     CEFTRIAXONE <=1 SENSITIVE Sensitive     CIPROFLOXACIN <=0.25 SENSITIVE Sensitive     GENTAMICIN <=1 SENSITIVE Sensitive     IMIPENEM <=0.25 SENSITIVE Sensitive     NITROFURANTOIN <=16 SENSITIVE Sensitive     TRIMETH/SULFA <=20 SENSITIVE Sensitive     AMPICILLIN/SULBACTAM <=2 SENSITIVE Sensitive     PIP/TAZO <=4 SENSITIVE Sensitive     Extended ESBL NEGATIVE Sensitive     * 50,000 COLONIES/mL ESCHERICHIA COLI   Morganella morganii - MIC*    AMPICILLIN >=32 RESISTANT Resistant     CEFAZOLIN >=64 RESISTANT Resistant     CEFTRIAXONE <=1 SENSITIVE Sensitive     CIPROFLOXACIN <=0.25 SENSITIVE Sensitive     GENTAMICIN <=1 SENSITIVE Sensitive     IMIPENEM 1 SENSITIVE Sensitive     NITROFURANTOIN 128 RESISTANT Resistant     TRIMETH/SULFA <=20 SENSITIVE Sensitive     AMPICILLIN/SULBACTAM >=32 RESISTANT Resistant     PIP/TAZO <=4 SENSITIVE Sensitive     * 20,000 COLONIES/mL MORGANELLA MORGANII  Respiratory Panel by PCR     Status: None   Collection Time: 07/29/16   7:32 AM  Result Value Ref Range Status   Adenovirus NOT DETECTED NOT DETECTED Final   Coronavirus 229E NOT DETECTED NOT DETECTED Final   Coronavirus HKU1 NOT DETECTED NOT DETECTED Final   Coronavirus NL63 NOT DETECTED NOT DETECTED Final   Coronavirus OC43 NOT DETECTED NOT DETECTED Final   Metapneumovirus NOT DETECTED NOT DETECTED Final   Rhinovirus / Enterovirus NOT DETECTED NOT DETECTED Final   Influenza A NOT DETECTED NOT DETECTED Final   Influenza B NOT DETECTED NOT DETECTED Final   Parainfluenza Virus 1 NOT DETECTED NOT DETECTED Final   Parainfluenza Virus 2 NOT DETECTED NOT DETECTED Final   Parainfluenza Virus 3 NOT DETECTED NOT DETECTED Final   Parainfluenza Virus 4 NOT DETECTED NOT DETECTED Final   Respiratory Syncytial Virus NOT DETECTED NOT DETECTED Final   Bordetella pertussis NOT DETECTED NOT DETECTED Final   Chlamydophila pneumoniae NOT DETECTED NOT DETECTED Final   Mycoplasma pneumoniae NOT DETECTED NOT DETECTED Final  Culture, blood (routine x 2)     Status: None (Preliminary result)   Collection Time: 08/01/16  7:37 PM  Result Value  Ref Range Status   Specimen Description BLOOD RIGHT WRIST  Final   Special Requests BOTTLES DRAWN AEROBIC AND ANAEROBIC 10CC EACH  Final   Culture NO GROWTH 2 DAYS  Final   Report Status PENDING  Incomplete  Culture, blood (routine x 2)     Status: None (Preliminary result)   Collection Time: 08/01/16  7:40 PM  Result Value Ref Range Status   Specimen Description BLOOD RIGHT HAND  Final   Special Requests IN PEDIATRIC BOTTLE 3CC  Final   Culture NO GROWTH 2 DAYS  Final   Report Status PENDING  Incomplete    Radiology Reports Ct Abdomen Pelvis Wo Contrast  Result Date: 07/28/2016 CLINICAL DATA:  Back and abdominal pain. EXAM: CT ABDOMEN AND PELVIS WITHOUT CONTRAST TECHNIQUE: Multidetector CT imaging of the abdomen and pelvis was performed following the standard protocol without IV contrast. COMPARISON:  05/18/2015 FINDINGS: Lower chest:  Calcified granuloma over the right middle lobe unchanged. Small left effusion and moderate size right pleural effusion. Associated consolidation over the right middle and lower lobes likely atelectasis although cannot exclude infection. Minimal left basilar atelectasis versus infection. Hepatobiliary: 3 mm calcification the region of the porta hepatis unchanged. Gallbladder, biliary tree and liver are otherwise unremarkable. Pancreas: Within normal. Spleen: Within normal. Adrenals/Urinary Tract: Adrenal glands are normal. Kidneys are normal in size without hydronephrosis, focal mass or nephrolithiasis. Ureters are normal. Bladder is mildly distended but otherwise unremarkable. Stomach/Bowel: Stomach and small bowel are within normal. Appendix is not visualized. Colon is within normal. Vascular/Lymphatic: Mild calcified plaque over the abdominal aorta and iliac arteries. No significant adenopathy. Reproductive: Uterus and adnexa are unremarkable. Other: Small ventral hernia over the midline below the umbilicus containing only peritoneal fat unchanged. 1.1 cm oval dense focus over the mesentery of the left lower quadrant likely small peritoneal loose body. Musculoskeletal: Mild degenerative change of the spine and hips. IMPRESSION: Small left effusion and moderate right pleural effusion. Consolidation over the lung bases right worse than left which may be due to atelectasis or infection. No evidence of nephroureterolithiasis or obstruction. Small midline ventral hernia below the umbilicus containing only peritoneal fat. Electronically Signed   By: Marin Olp M.D.   On: 07/28/2016 16:53   Dg Chest 2 View  Result Date: 07/28/2016 CLINICAL DATA:  Fever.  Altered mental status.  Chest pain. EXAM: CHEST  2 VIEW COMPARISON:  12/31/2008 FINDINGS: The cardiac silhouette appears mildly enlarged, accentuated by low lung volumes. Aortic atherosclerosis is noted. There is new mild diffuse interstitial accentuation.  Airspace opacities are present in the right greater than left lung bases, and there small pleural effusions. No pneumothorax is identified. No acute osseous abnormality is seen. IMPRESSION: 1. Low lung volumes with right greater than left basilar airspace opacities, concerning for pneumonia although a combination of edema and atelectasis could also give a similar appearance. 2. Small pleural effusions. 3. Aortic atherosclerosis. Electronically Signed   By: Logan Bores M.D.   On: 07/28/2016 11:00   Ct Head Wo Contrast  Result Date: 08/03/2016 CLINICAL DATA:  Encephalopathy. Altered mental status. Fever. No reported injury. EXAM: CT HEAD WITHOUT CONTRAST TECHNIQUE: Contiguous axial images were obtained from the base of the skull through the vertex without intravenous contrast. COMPARISON:  None. FINDINGS: Brain: There are tiny bilateral basal ganglia lacunar infarcts, which appear chronic. There is a small focus of encephalomalacia in the superior right parietal lobe (series 3/ image 25). No evidence of parenchymal hemorrhage or extra-axial fluid collection. No mass  lesion, mass effect, or midline shift. No CT evidence of acute infarction. Nonspecific mild to moderate subcortical and periventricular white matter hypodensity, most in keeping with chronic small vessel ischemic change. Cerebral volume is age appropriate. No ventriculomegaly. Vascular: No hyperdense vessel or unexpected calcification. Skull: No evidence of calvarial fracture. Sinuses/Orbits: No fluid levels in the visualized paranasal sinuses. Mild mucoperiosteal thickening in the frontal sinus and bilateral ethmoidal cells. There are a few abnormal punctate densities in the central and posterior right globe. Other:  The mastoid air cells are unopacified. IMPRESSION: 1.  No evidence of acute intracranial abnormality. 2. Tiny bilateral basal ganglia lacunes, chronic appearing. Small focus of encephalomalacia in the superior right parietal lobe,  presumably from a small remote infarct. 3. Mild-to-moderate chronic small vessel ischemia. 4. Mild paranasal sinusitis, probably chronic. 5. Nonspecific punctate densities in the central and posterior right globe of uncertain acuity, recommend correlation with ophthalmology examination. Electronically Signed   By: Jason A Poff M.D.   On: 08/03/2016 16:18   Ir Fluoro Guide Cv Line Right  Result Date: 08/01/2016 INDICATION: End-stage renal disease, poorly functioning left upper arm AV fistula. In need of durable intravenous access for initiation of dialysis. EXAM: TUNNELED CENTRAL VENOUS HEMODIALYSIS CATHETER PLACEMENT WITH ULTRASOUND AND FLUOROSCOPIC GUIDANCE MEDICATIONS: Ancef 2 gm IV . The antibiotic was given in an appropriate time interval prior to skin puncture. ANESTHESIA/SEDATION: Versed 2 mg IV; Fentanyl 75 mcg IV; Moderate Sedation Time:  15 The patient was continuously monitored during the procedure by the interventional radiology nurse under my direct supervision. FLUOROSCOPY TIME:  Fluoroscopy Time: 54 seconds (6 mGy). COMPLICATIONS: None immediate. PROCEDURE: Informed written consent was obtained from the patient after a discussion of the risks, benefits, and alternatives to treatment. Questions regarding the procedure were encouraged and answered. The right neck and chest were prepped with chlorhexidine in a sterile fashion, and a sterile drape was applied covering the operative field. Maximum barrier sterile technique with sterile gowns and gloves were used for the procedure. A timeout was performed prior to the initiation of the procedure. After creating a small venotomy incision, a micropuncture kit was utilized to access the right internal jugular vein under direct, real-time ultrasound guidance after the overlying soft tissues were anesthetized with 1% lidocaine with epinephrine. Ultrasound image documentation was performed. The microwire was kinked to measure appropriate catheter length. A  stiff Glidewire was advanced to the level of the IVC and the micropuncture sheath was exchanged for a peel-away sheath. A palindrome tunneled hemodialysis catheter measuring 23 cm from tip to cuff was tunneled in a retrograde fashion from the anterior chest wall to the venotomy incision. The catheter was then placed through the peel-away sheath with tips ultimately positioned within the superior aspect of the right atrium. Final catheter positioning was confirmed and documented with a spot radiographic image. The catheter aspirates and flushes normally. The catheter was flushed with appropriate volume heparin dwells. The catheter exit site was secured with a 0-Prolene retention suture. The venotomy incision was closed with an interrupted 4-0 Vicryl, Dermabond and Steri-strips. Dressings were applied. The patient tolerated the procedure well without immediate post procedural complication. IMPRESSION: Successful placement of 23 cm tip to cuff tunneled hemodialysis catheter via the right internal jugular vein with tips terminating within the superior aspect of the right atrium. The catheter is ready for immediate use. Electronically Signed   By: John  Watts M.D.   On: 08/01/2016 12:00   Ir Us Guide Vasc Access Right  Result Date:   08/01/2016 INDICATION: End-stage renal disease, poorly functioning left upper arm AV fistula. In need of durable intravenous access for initiation of dialysis. EXAM: TUNNELED CENTRAL VENOUS HEMODIALYSIS CATHETER PLACEMENT WITH ULTRASOUND AND FLUOROSCOPIC GUIDANCE MEDICATIONS: Ancef 2 gm IV . The antibiotic was given in an appropriate time interval prior to skin puncture. ANESTHESIA/SEDATION: Versed 2 mg IV; Fentanyl 75 mcg IV; Moderate Sedation Time:  15 The patient was continuously monitored during the procedure by the interventional radiology nurse under my direct supervision. FLUOROSCOPY TIME:  Fluoroscopy Time: 54 seconds (6 mGy). COMPLICATIONS: None immediate. PROCEDURE: Informed  written consent was obtained from the patient after a discussion of the risks, benefits, and alternatives to treatment. Questions regarding the procedure were encouraged and answered. The right neck and chest were prepped with chlorhexidine in a sterile fashion, and a sterile drape was applied covering the operative field. Maximum barrier sterile technique with sterile gowns and gloves were used for the procedure. A timeout was performed prior to the initiation of the procedure. After creating a small venotomy incision, a micropuncture kit was utilized to access the right internal jugular vein under direct, real-time ultrasound guidance after the overlying soft tissues were anesthetized with 1% lidocaine with epinephrine. Ultrasound image documentation was performed. The microwire was kinked to measure appropriate catheter length. A stiff Glidewire was advanced to the level of the IVC and the micropuncture sheath was exchanged for a peel-away sheath. A palindrome tunneled hemodialysis catheter measuring 23 cm from tip to cuff was tunneled in a retrograde fashion from the anterior chest wall to the venotomy incision. The catheter was then placed through the peel-away sheath with tips ultimately positioned within the superior aspect of the right atrium. Final catheter positioning was confirmed and documented with a spot radiographic image. The catheter aspirates and flushes normally. The catheter was flushed with appropriate volume heparin dwells. The catheter exit site was secured with a 0-Prolene retention suture. The venotomy incision was closed with an interrupted 4-0 Vicryl, Dermabond and Steri-strips. Dressings were applied. The patient tolerated the procedure well without immediate post procedural complication. IMPRESSION: Successful placement of 23 cm tip to cuff tunneled hemodialysis catheter via the right internal jugular vein with tips terminating within the superior aspect of the right atrium. The catheter  is ready for immediate use. Electronically Signed   By: [name] M.D.   On: 08/01/2016 12:00    Lab Data:  CBC:  Recent Labs Lab 07/30/16 0353 08/01/16 0843 08/04/16 0622  WBC 9.7 5.6 6.2  HGB 8.2* 7.9* 8.9*  HCT 25.3* 24.6* 27.6*  MCV 87.5 88.2 89.0  PLT 221 253 470   Basic Metabolic Panel:  Recent Labs Lab 07/30/16 0353 07/31/16 0529 08/01/16 0843 08/02/16 0626 08/03/16 0418 08/04/16 0622  NA 137 137 136 136 138 136  K 3.9 4.3 4.5 3.8 3.8 4.2  CL 103 102 102 100* 101 98*  CO2 _0 GLUCOSE 109* 72 92 65 82 89  BUN 42* 48* 51* 21* 28* 11  CREATININE 6.76* 7.83* 8.73* 5.11* 6.65* 4.36*  CALCIUM 7.7* 7.7* 7.7* 7.7* 8.1* 8.3*  PHOS 5.2* 7.5* 8.6* 6.5* 7.9*  --    GFR: Estimated Creatinine Clearance: 11.4 mL/min (by C-G formula based on SCr of 4.36 mg/dL (H)). Liver Function Tests:  Recent Labs Lab 07/29/16 1612 07/30/16 7615 07/31/16 0529 08/01/16 1834 08/02/16 0626 08/03/16 0418  AST  --   --  25  --   --   --  ALT 12*  --  14  --   --   --   ALKPHOS  --   --  107  --   --   --   BILITOT  --   --  0.6  --   --   --   PROT  --   --  5.3*  --   --   --   ALBUMIN  --  1.6* 1.6*  1.5* 1.3* 1.4* 1.5*   No results for input(s): LIPASE, AMYLASE in the last 168 hours.  Recent Labs Lab 07/28/16 2001 08/03/16 1500  AMMONIA 22 35   Coagulation Profile:  Recent Labs Lab 07/28/16 1427  INR 1.36   Cardiac Enzymes: No results for input(s): CKTOTAL, CKMB, CKMBINDEX, TROPONINI in the last 168 hours. BNP (last 3 results) No results for input(s): PROBNP in the last 8760 hours. HbA1C: No results for input(s): HGBA1C in the last 72 hours. CBG:  Recent Labs Lab 08/03/16 1806 08/03/16 2054 08/03/16 2345 08/04/16 0746 08/04/16 1204  GLUCAP 110* 82 127* 90 91   Lipid Profile: No results for input(s): CHOL, HDL, LDLCALC, TRIG, CHOLHDL, LDLDIRECT in the last 72 hours. Thyroid Function Tests: No results for input(s): TSH, T4TOTAL,  FREET4, T3FREE, THYROIDAB in the last 72 hours. Anemia Panel: No results for input(s): VITAMINB12, FOLATE, FERRITIN, TIBC, IRON, RETICCTPCT in the last 72 hours. Urine analysis:    Component Value Date/Time   COLORURINE YELLOW 07/28/2016 1709   APPEARANCEUR HAZY (A) 07/28/2016 1709   LABSPEC 1.016 07/28/2016 1709   PHURINE 6.0 07/28/2016 1709   GLUCOSEU >=500 (A) 07/28/2016 1709   HGBUR SMALL (A) 07/28/2016 1709   BILIRUBINUR NEGATIVE 07/28/2016 1709   BILIRUBINUR neg 03/10/2016 1121   KETONESUR 5 (A) 07/28/2016 1709   PROTEINUR >=300 (A) 07/28/2016 1709   UROBILINOGEN 0.2 03/10/2016 1121   UROBILINOGEN 0.2 12/31/2008 1556   NITRITE NEGATIVE 07/28/2016 1709   LEUKOCYTESUR NEGATIVE 07/28/2016 1709     Kino Dunsworth M.D. [facility] 08/04/2016, 1:06 PM  Pager: [phone] Between 7am to 7pm - call Pager - [phone]  After 7pm go to [email or web address] - password [email or web address]  Call night coverage person covering after 7pm

## 2016-08-04 NOTE — Progress Notes (Signed)
[facility] Progress Note    Assessment/ Plan:   1. CKD stage V / uremia - improved sp HD x 2 yest.  - HD #3 yesterday  CLIP'd --> she will be TTS at [facility] center; she can start tomorrow at 11am  From renal standpoint ok for d/c.  - great bruit in the left BCF but multiple nurses and techs were unable to cannulate the venous needle and on ultrasound bedside the vein is deep in long segments in the cannulation zone (>1cm). - Appreciate VVS consult for a 2nd opinion      - plan for office appt in 2 weeks to allow non active extravasation to settle before superficialization.  - Appreciate VIR placing the RIJ TC 08/01/16 (D#3).  2. AMS - improving, from #1/#3 (uremia) 3. Pneumococcal PNA w bacteremia - on IV Rocephin now. Fever curve trending down and <99 deg past 2 days. 4. Anemia  - Gave single dose of Nulecit 250mg  x1 (with recent infection do not want to fully load) - Then started ESA (? Response with recent infection) 5. UTI - morganella and ecoli both susc to rocephin. 6. HTN - BP's good, home BP med on hold 7. IDDM 8. Disp - not consistent PT bec pt is often at procedures. Really needs PT; she's not even getting out of bed. Educated daughter who was bedside today to at least help her to sit on side of bed q2hrs.  Subjective:   Very weak but denies f/c/n/v.  Daughter made sure pt sat on side of bed yesterday as well as stood on her own for 5 min yesterday.  Daughter bedside as was the [place] interpreter.   Objective:   BP (!) 166/67 (BP Location: Right Arm)   Pulse (!) 101   Temp 98.2 F (36.8 C) (Oral)   Resp 18   Ht 5\' 1"  (1.549 m)   Wt 72.3 kg (159 lb 6.3 oz)   LMP  (LMP Unknown)   SpO2 92%   BMI 30.12 kg/m   Intake/Output Summary (Last 24 hours) at 08/04/16 0815 Last data filed at 08/03/16 2157  Gross per 24 hour  Intake               50 ml  Output             1550 ml  Net            -1500 ml   Weight change:   Physical  Exam: Gen elderly hispanic female, no asterixis, alert, answers questions No jvd or bruits Chest clear bilat, RIJ TC RRR no MRG Abd soft ntnd no mass or ascites +bs Ext no LEedema / L 3rd toe absent Neuro alert, nonfocal, responds appropriately LUA BCF+bruit augments well at inflow; difficult to palpate margins in the cannulation zone  Imaging: Ct Head Wo Contrast  Result Date: 08/03/2016 CLINICAL DATA:  Encephalopathy. Altered mental status. Fever. No reported injury. EXAM: CT HEAD WITHOUT CONTRAST TECHNIQUE: Contiguous axial images were obtained from the base of the skull through the vertex without intravenous contrast. COMPARISON:  None. FINDINGS: Brain: There are tiny bilateral basal ganglia lacunar infarcts, which appear chronic. There is a small focus of encephalomalacia in the superior right parietal lobe (series 3/ image 25). No evidence of parenchymal hemorrhage or extra-axial fluid collection. No mass lesion, mass effect, or midline shift. No CT evidence of acute infarction. Nonspecific mild to moderate subcortical and periventricular white matter hypodensity, most in keeping with chronic small  vessel ischemic change. Cerebral volume is age appropriate. No ventriculomegaly. Vascular: No hyperdense vessel or unexpected calcification. Skull: No evidence of calvarial fracture. Sinuses/Orbits: No fluid levels in the visualized paranasal sinuses. Mild mucoperiosteal thickening in the frontal sinus and bilateral ethmoidal cells. There are a few abnormal punctate densities in the central and posterior right globe. Other:  The mastoid air cells are unopacified. IMPRESSION: 1.  No evidence of acute intracranial abnormality. 2. Tiny bilateral basal ganglia lacunes, chronic appearing. Small focus of encephalomalacia in the superior right parietal lobe, presumably from a small remote infarct. 3. Mild-to-moderate chronic small vessel ischemia. 4. Mild paranasal sinusitis, probably chronic. 5. Nonspecific  punctate densities in the central and posterior right globe of uncertain acuity, recommend correlation with ophthalmology examination. Electronically Signed   By: Ilona Sorrel M.D.   On: 08/03/2016 16:18    Labs: BMET  Recent Labs Lab 07/28/16 1032 07/30/16 0353 07/31/16 0529 08/01/16 0843 08/02/16 0626 08/03/16 0418 08/04/16 0622  NA 138 137 137 136 136 138 136  K 4.9 3.9 4.3 4.5 3.8 3.8 4.2  CL 108 103 102 102 100* 101 98*  CO2 18* 24 24 23 26 27 25   GLUCOSE 297* 109* 72 92 65 82 89  BUN 80* 42* 48* 51* 21* 28* 11  CREATININE 10.41* 6.76* 7.83* 8.73* 5.11* 6.65* 4.36*  CALCIUM 8.1* 7.7* 7.7* 7.7* 7.7* 8.1* 8.3*  PHOS  --  5.2* 7.5* 8.6* 6.5* 7.9*  --    CBC  Recent Labs Lab 07/28/16 1032 07/30/16 0353 08/01/16 0843 08/04/16 0622  WBC 15.7* 9.7 5.6 6.2  HGB 8.9* 8.2* 7.9* 8.9*  HCT 27.3* 25.3* 24.6* 27.6*  MCV 88.3 87.5 88.2 89.0  PLT 218 221 253 221    Medications:    . atorvastatin  40 mg Oral QHS  . calcium acetate  1,334 mg Oral TID WC  . cefTRIAXone (ROCEPHIN)  IV  2 g Intravenous Q24H  . darbepoetin (ARANESP) injection - DIALYSIS  60 mcg Intravenous Once  . DULoxetine  60 mg Oral Daily  . ferric gluconate (FERRLECIT/NULECIT) IV  250 mg Intravenous Once  . heparin  5,000 Units Subcutaneous Q8H  . insulin aspart  0-15 Units Subcutaneous TID WC  . insulin aspart  0-5 Units Subcutaneous QHS  . insulin glargine  15 Units Subcutaneous QHS  . kidney failure book   Does not apply Once  . sodium chloride flush  3 mL Intravenous Q12H      Otelia Santee, MD 08/04/2016, 8:15 AM

## 2016-08-04 NOTE — Progress Notes (Signed)
Interpreter Lesle Chris for DR Ron Agee RN

## 2016-08-04 NOTE — Discharge Summary (Signed)
Physician Discharge Summary   Patient ID: Sarah Kelley MRN: [id number] DOB/AGE: 1950/11/02 66 y.o.  Admit date: 07/28/2016 Discharge date: 08/04/2016  Primary Care Physician:  Arnoldo Morale, MD  Discharge Diagnoses:        Sepsis secondary to Streptococcus  Pneumonia   Lobar pneumonia  . Essential hypertension . Normocytic anemia . Depression . Acute metabolic encephalopathy resolved . Acute on chronic kidney disease stage V, ESRD now HD dependent . Urinary tract infection   Consults: Nephrology Vascular surgery  Recommendations for Outpatient Follow-up:  1. Patient is now accepted to [facility] HD center, Tuesday Thursday and Saturday dialysis, started on tomorrow at 11 AM 2. Please repeat CBC/BMET at next visit She has completed full course of antibiotics today and does not need any further antibiotics  DIET: Carb modified diet    Allergies:  No Known Allergies   DISCHARGE MEDICATIONS: Current Discharge Medication List    START taking these medications   Details  calcium acetate (PHOSLO) 667 MG capsule Take 2 capsules (1,334 mg total) by mouth 3 (three) times daily with meals. Qty: 180 capsule, Refills: 3      CONTINUE these medications which have CHANGED   Details  insulin glargine (LANTUS) 100 UNIT/ML injection Inject 0.15 mLs (15 Units total) into the skin at bedtime. Qty: 30 mL, Refills: 3   Associated Diagnoses: Pure hypercholesterolemia      CONTINUE these medications which have NOT CHANGED   Details  acetaminophen (TYLENOL) 325 MG tablet Take 650 mg by mouth every 6 (six) hours as needed for mild pain. Reported on 09/13/2015    atorvastatin (LIPITOR) 40 MG tablet Take 1 tablet (40 mg total) by mouth daily. Qty: 30 tablet, Refills: 3   Associated Diagnoses: Pure hypercholesterolemia    cetirizine (ZYRTEC) 10 MG tablet Take 1 tablet (10 mg total) by mouth daily. Qty: 30 tablet, Refills: 3   Associated Diagnoses: Post-nasal drip     DULoxetine (CYMBALTA) 60 MG capsule Take 1 capsule (60 mg total) by mouth daily. Qty: 30 capsule, Refills: 3   Associated Diagnoses: Other diabetic neurological complication associated with type 2 diabetes mellitus (HCC)      STOP taking these medications     gabapentin (NEURONTIN) 300 MG capsule      valsartan (DIOVAN) 160 MG tablet          Brief H and P: For complete details please refer to admission H and P, but in brief 66 year old Hispanic female with chronic kidney disease stage V,, insulin-dependent diabetes mellitus, hypertension, depression who presented to ED with 2-3 days of fevers with confusion and hallucinations. History provided by family. She has a LUE fistula for future dialysis which has matured. Her husband was initially hesitant on dialysis but has now agreed. No documented fever at home, nausea, vomiting or diarrhea. She has been having intermittent hallucination and confusion, seeing people and describing bizarre events to the family that has never occurred. In the ED she was septic with fever of 101.89F, tachycardic. Chest x-ray showing lobar opacity concerning for pneumonia. Labs showed BUN of 80 and creatinine of 10.4, bicarbonate of 18 and serum glucose of 297. WBC was elevated to 15.7 K hemoglobin of 8.9. Lactic acid will is normal, mildly elevated INR with UA showing proteinuria and microscopic hematuria. CT abdomen and pelvis given right upper quadrant pain showing small left and moderate right pleural effusion with bilateral lung base consolidation. (R>L) Patient was given 1 L normal saline bolus, cultures sent and placed on  empiric antibiotics. Flu PCR was negative.   Hospital Course:  Sepsis secondary to lobar pneumonia.  - Sepsis physiology resolved.  - Blood cultures growing Streptococcus pneumoniae. - Repeat blood cultures 1/30 negative so far - Continueempiric Rocephin, 7 days, stop date 2/2  Acute metabolic encephalopathy - Resolved,  intermittently confused, yesterday worse after HD - CT head without any acute infarct. Ammonia level normal. - Neurontin, narcotics discontinued,   UTI -  urine culture showed Escherichia coli, Morganella, sensitive to ceftriaxone, negative ESBL  Right upper quadrant abdominal pain with nausea - Suspect secondary to pleurisy. CT abdomen and LFTs normal. -  Symptoms resolved.  Acute on chronic kidney disease stage V, ESRD now HD dependent - LUL fistula placed recentlyand now hemodialysis dependent this admission. CLIP process initiated, patient accepted to [facility] HD center, TTS schedule . - Patient underwent tunneled HD catheter placement on 1/30 for occluded AV fistula..  - Patient was followed by nephrology closely. She will follow with vascular surgery outpatient,  Normocytic anemia - Secondary to chronic kidney disease.  - Stable at baseline.  Uncontrolled type 2 diabetes mellitus -CBG stable.  - Decrease Lantus since patient now dialysis dependent.    Depression Continue Cymbalta.  Essential hypertension Stable.   Day of Discharge BP (!) 166/67 (BP Location: Right Arm)   Pulse (!) 101   Temp 98.2 F (36.8 C) (Oral)   Resp 18   Ht 5\' 1"  (1.549 m)   Wt 72.3 kg (159 lb 6.3 oz)   LMP  (LMP Unknown)   SpO2 92%   BMI 30.12 kg/m   Physical Exam: General: Alert and awake oriented x3 not in any acute distress. HEENT: anicteric sclera, pupils reactive to light and accommodation CVS: S1-S2 clear no murmur rubs or gallops Chest: clear to auscultation bilaterally, no wheezing rales or rhonchi Abdomen: soft nontender, nondistended, normal bowel sounds Extremities: no cyanosis, clubbing or edema noted bilaterally Neuro: Cranial nerves II-XII intact, no focal neurological deficits   The results of significant diagnostics from this hospitalization (including imaging, microbiology, ancillary and laboratory) are listed below for reference.    LAB  RESULTS: Basic Metabolic Panel:  Recent Labs Lab 08/03/16 0418 08/04/16 0622  NA 138 136  K 3.8 4.2  CL 101 98*  CO2 27 25  GLUCOSE 82 89  BUN 28* 11  CREATININE 6.65* 4.36*  CALCIUM 8.1* 8.3*  PHOS 7.9*  --    Liver Function Tests:  Recent Labs Lab 07/29/16 1612  07/31/16 0529  08/02/16 0626 08/03/16 0418  AST  --   --  25  --   --   --   ALT 12*  --  14  --   --   --   ALKPHOS  --   --  107  --   --   --   BILITOT  --   --  0.6  --   --   --   PROT  --   --  5.3*  --   --   --   ALBUMIN  --   < > 1.6*  1.5*  < > 1.4* 1.5*  < > = values in this interval not displayed. No results for input(s): LIPASE, AMYLASE in the last 168 hours.  Recent Labs Lab 07/28/16 2001 08/03/16 1500  AMMONIA 22 35   CBC:  Recent Labs Lab 08/01/16 0843 08/04/16 0622  WBC 5.6 6.2  HGB 7.9* 8.9*  HCT 24.6* 27.6*  MCV 88.2 89.0  PLT  253 221   Cardiac Enzymes: No results for input(s): CKTOTAL, CKMB, CKMBINDEX, TROPONINI in the last 168 hours. BNP: Invalid input(s): POCBNP CBG:  Recent Labs Lab 08/04/16 0746 08/04/16 1204  GLUCAP 90 91    Significant Diagnostic Studies:  Ct Abdomen Pelvis Wo Contrast  Result Date: 07/28/2016 CLINICAL DATA:  Back and abdominal pain. EXAM: CT ABDOMEN AND PELVIS WITHOUT CONTRAST TECHNIQUE: Multidetector CT imaging of the abdomen and pelvis was performed following the standard protocol without IV contrast. COMPARISON:  05/18/2015 FINDINGS: Lower chest: Calcified granuloma over the right middle lobe unchanged. Small left effusion and moderate size right pleural effusion. Associated consolidation over the right middle and lower lobes likely atelectasis although cannot exclude infection. Minimal left basilar atelectasis versus infection. Hepatobiliary: 3 mm calcification the region of the porta hepatis unchanged. Gallbladder, biliary tree and liver are otherwise unremarkable. Pancreas: Within normal. Spleen: Within normal. Adrenals/Urinary Tract:  Adrenal glands are normal. Kidneys are normal in size without hydronephrosis, focal mass or nephrolithiasis. Ureters are normal. Bladder is mildly distended but otherwise unremarkable. Stomach/Bowel: Stomach and small bowel are within normal. Appendix is not visualized. Colon is within normal. Vascular/Lymphatic: Mild calcified plaque over the abdominal aorta and iliac arteries. No significant adenopathy. Reproductive: Uterus and adnexa are unremarkable. Other: Small ventral hernia over the midline below the umbilicus containing only peritoneal fat unchanged. 1.1 cm oval dense focus over the mesentery of the left lower quadrant likely small peritoneal loose body. Musculoskeletal: Mild degenerative change of the spine and hips. IMPRESSION: Small left effusion and moderate right pleural effusion. Consolidation over the lung bases right worse than left which may be due to atelectasis or infection. No evidence of nephroureterolithiasis or obstruction. Small midline ventral hernia below the umbilicus containing only peritoneal fat. Electronically Signed   By: Marin Olp M.D.   On: 07/28/2016 16:53   Dg Chest 2 View  Result Date: 07/28/2016 CLINICAL DATA:  Fever.  Altered mental status.  Chest pain. EXAM: CHEST  2 VIEW COMPARISON:  12/31/2008 FINDINGS: The cardiac silhouette appears mildly enlarged, accentuated by low lung volumes. Aortic atherosclerosis is noted. There is new mild diffuse interstitial accentuation. Airspace opacities are present in the right greater than left lung bases, and there small pleural effusions. No pneumothorax is identified. No acute osseous abnormality is seen. IMPRESSION: 1. Low lung volumes with right greater than left basilar airspace opacities, concerning for pneumonia although a combination of edema and atelectasis could also give a similar appearance. 2. Small pleural effusions. 3. Aortic atherosclerosis. Electronically Signed   By: Logan Bores M.D.   On: 07/28/2016 11:00     2D ECHO:   Disposition and Follow-up: Discharge Instructions    Diet Carb Modified    Complete by:  As directed    Discharge instructions    Complete by:  As directed    You can start your dialysis at [facility]., Tuesday Thursday and Saturday. Start tomorrow at [place]. Go on 08/08/2016.   Why:  Post hospital follow up appointment scheduled on 08/08/2016 at 3:45pm with Dr. Kathrene Alu information: [place] [id number] [phone]       Servando Snare, MD Follow up in 2 week(s).   Specialties:  Vascular Surgery, Cardiology Why:  office will arrange Contact information: [place] [address] [phone]  Time spent on Discharge: 29 mins   Signed:   Keanthony Poole M.D. [facility] 08/04/2016, 1:13 PM Pager: [phone]

## 2016-08-04 NOTE — Care Management Note (Addendum)
Case Management Note  Patient Details  Name: Sarah Kelley MRN: [id number] Date of Birth: Apr 24, 1951  Subjective/Objective:  PNA, ESRD now HD dependent                   Action/Plan: Discharge Planning: AVS reviewed:   NCM to pt's via interpreter. Her grand-dtr, Dorthula Rue [phone]. Pt has RW at home. Provided pt with MATCH, explained she can use once per year with $3 copay. Pt has appt at [facility] 08/08/2016 at 3:45 pm. Lives at home with husband. Pt unable to pay for HHPT, it will be out of pocket.    TTS at [facility] at 11 am on [place]  PCP Arnoldo Morale MD  Expected Discharge Date:  08/04/16               Expected Discharge Plan:  Home/Self Care  In-House Referral:  Financial Counselor  Discharge planning Services  Follow-up appt scheduled, CM Consult, Medication Assistance  Post Acute Care Choice:  NA Choice offered to:  NA  DME Arranged:  N/A DME Agency:  NA  HH Arranged:  NA HH Agency:  NA  Status of Service:  Completed, signed off  If discussed at [place] of Stay Meetings, dates discussed:    Additional Comments:  Erenest Rasher, RN 08/04/2016, 3:36 PM

## 2016-08-06 LAB — CULTURE, BLOOD (ROUTINE X 2)
CULTURE: NO GROWTH
CULTURE: NO GROWTH

## 2016-08-08 ENCOUNTER — Inpatient Hospital Stay: Payer: Self-pay | Admitting: Family Medicine

## 2016-08-10 ENCOUNTER — Encounter: Payer: Self-pay | Admitting: Vascular Surgery

## 2016-08-16 ENCOUNTER — Encounter: Payer: Self-pay | Admitting: Vascular Surgery

## 2016-08-16 ENCOUNTER — Ambulatory Visit (INDEPENDENT_AMBULATORY_CARE_PROVIDER_SITE_OTHER): Payer: Self-pay | Admitting: Vascular Surgery

## 2016-08-16 VITALS — BP 103/61 | HR 94 | Temp 98.0°F | Resp 18 | Ht 61.0 in | Wt 159.0 lb

## 2016-08-16 DIAGNOSIS — Z992 Dependence on renal dialysis: Secondary | ICD-10-CM

## 2016-08-16 DIAGNOSIS — N186 End stage renal disease: Secondary | ICD-10-CM

## 2016-08-16 MED FILL — VALSARTAN 160 MG TABLET: 160 | 30 days supply | Qty: 30 | Fill #1

## 2016-08-16 MED FILL — ATORVASTATIN 40 MG TABLET: 40 | 30 days supply | Qty: 30 | Fill #1

## 2016-08-16 NOTE — Progress Notes (Signed)
Patient ID: Sarah Kelley, female   DOB: 1951-06-21, 66 y.o.   MRN: [id number]  Reason for Consult: Follow-up (pre-op planning for superficialization of L AVF )   Referred by Arnoldo Morale, MD  Subjective:     HPI:  Sarah Kelley is a 66 y.o. female with placement brachiocephalic AV fistula in September. She did not follow-up of that time was recently seen by Dr. Donnetta Hutching while hospitalized for other issues. She does have some confusion which the son says is new via interpreter. She is not having left upper extremity pain. There is difficulty cannulating the fistula due to the depth. She currently dialyzing via a tunneled catheter.  Past Medical History:  Diagnosis Date  . Diabetes mellitus   . Hyperlipidemia   . Hypertension   . Neuropathy ([place])    Diabetic  . Renal disorder   . Thyroid disease    No family history on file. Past Surgical History:  Procedure Laterality Date  . 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 AV FISTULA PLACEMENT Left 03/28/2016   Procedure: ARTERIOVENOUS (AV) FISTULA CREATION LEFT ARM;  Surgeon: Waynetta Sandy, MD;  Location: [facility];  Service: Vascular;  Laterality: Left;  . DILATION AND CURETTAGE OF UTERUS    . IR GENERIC HISTORICAL  08/01/2016   IR FLUORO GUIDE CV LINE RIGHT 08/01/2016 [name], MD [facility]-INTERV [facility]  . IR GENERIC HISTORICAL  08/01/2016   IR US GUIDE VASC ACCESS RIGHT 08/01/2016 [name], MD [facility]INTER[facility]    Short Social History:  Social History  Substance Use Topics  . Smoking status: Never Smoker  . Smokeless tobacco: Never Used  . Alcohol use No     Comment: rare- a beer every now and then    No Known Allergies  Current Outpatient Prescriptions  Medication Sig Dispense Refill  . acetaminophen (TYLENOL) 325 MG tablet Take 650 mg by mouth every 6 (six) hours as needed for mild pain.  Reported on 09/13/2015    . atorvastatin (LIPITOR) 40 MG tablet Take 1 tablet (40 mg total) by mouth daily. 30 tablet 3  . calcium acetate (PHOSLO) 667 MG capsule Take 2 capsules (1,334 mg total) by mouth 3 (three) times daily with meals. 180 capsule 3  . cetirizine (ZYRTEC) 10 MG tablet Take 1 tablet (10 mg total) by mouth daily. 30 tablet 3  . DULoxetine (CYMBALTA) 60 MG capsule Take 1 capsule (60 mg total) by mouth daily. 30 capsule 3  . insulin glargine (LANTUS) 100 UNIT/ML injection Inject 0.15 mLs (15 Units total) into the skin at bedtime. 30 mL 3   No current facility-administered medications for this visit.     Review of Systems  Constitutional:  Constitutional negative. HENT: HENT negative.  Eyes: Eyes negative.  Respiratory: Respiratory negative.  Cardiovascular: Cardiovascular negative.  GI: Gastrointestinal negative.  Musculoskeletal: Musculoskeletal negative.  Skin: Skin negative.  Neurological: Neurological negative. Hematologic: Hematologic/lymphatic negative.  Psychiatric: Positive for confusion.        Objective:  Objective   Vitals:   08/16/16 1217  BP: 103/61  Pulse: 94  Resp: 18  Temp: 98 F (36.7 C)  TempSrc: Oral  SpO2: 97%  Weight: 159 lb (72.1 kg)  Height: 5\' 1"  (1.549 m)   Body mass index is 30.04 kg/m.  Physical Exam  Constitutional: She appears well-developed.  Eyes: Pupils are equal, round, and reactive  to light.  Neck: Normal range of motion.  Cardiovascular:  Left arm 2+ radial pulse Palpable fistula near antecubitum, difficult to palpate centrally on arm   Pulmonary/Chest: Effort normal.  Musculoskeletal: Normal range of motion. She exhibits no edema.  Neurological: She is alert.  Skin: Skin is warm and dry.  Psychiatric: She has a normal mood and affect. Her behavior is normal. Judgment and thought content normal.         Assessment/Plan:    66 year old female status post left radiocephalic AV fistula that has a strong  palpable thrill in the antecubitum. Unfortunately cannot cannulate avf in the upper arm. We will plan for superficialization with possible ligation of branches in the near future. I have instructed her to follow-up with her PCP for ongoing confusion since her recent hospitalization and her son and her agree via interpreter. All questions were answered we'll plan surgery soon.    Waynetta Sandy MD [facility]

## 2016-08-18 ENCOUNTER — Other Ambulatory Visit: Payer: Self-pay

## 2016-08-21 MED FILL — ?CETIRIZINE HCL 10 MG TABLE: 10 | 30 days supply | Qty: 30 | Fill #2

## 2016-08-21 MED FILL — GABAPENTIN 300 MG CAPSULE: 300 | 30 days supply | Qty: 60 | Fill #2

## 2016-08-30 ENCOUNTER — Encounter: Payer: Self-pay | Admitting: Family Medicine

## 2016-08-30 ENCOUNTER — Ambulatory Visit: Payer: Self-pay | Attending: Family Medicine | Admitting: Family Medicine

## 2016-08-30 VITALS — BP 143/70 | HR 109 | Temp 98.3°F | Ht 60.0 in | Wt 143.4 lb

## 2016-08-30 DIAGNOSIS — Z992 Dependence on renal dialysis: Secondary | ICD-10-CM | POA: Insufficient documentation

## 2016-08-30 DIAGNOSIS — E1122 Type 2 diabetes mellitus with diabetic chronic kidney disease: Secondary | ICD-10-CM | POA: Insufficient documentation

## 2016-08-30 DIAGNOSIS — R112 Nausea with vomiting, unspecified: Secondary | ICD-10-CM

## 2016-08-30 DIAGNOSIS — E1140 Type 2 diabetes mellitus with diabetic neuropathy, unspecified: Secondary | ICD-10-CM | POA: Insufficient documentation

## 2016-08-30 DIAGNOSIS — J189 Pneumonia, unspecified organism: Secondary | ICD-10-CM

## 2016-08-30 DIAGNOSIS — E1165 Type 2 diabetes mellitus with hyperglycemia: Secondary | ICD-10-CM | POA: Insufficient documentation

## 2016-08-30 DIAGNOSIS — R1011 Right upper quadrant pain: Secondary | ICD-10-CM | POA: Insufficient documentation

## 2016-08-30 DIAGNOSIS — E079 Disorder of thyroid, unspecified: Secondary | ICD-10-CM | POA: Insufficient documentation

## 2016-08-30 DIAGNOSIS — E785 Hyperlipidemia, unspecified: Secondary | ICD-10-CM | POA: Insufficient documentation

## 2016-08-30 DIAGNOSIS — J90 Pleural effusion, not elsewhere classified: Secondary | ICD-10-CM | POA: Insufficient documentation

## 2016-08-30 DIAGNOSIS — R000 Tachycardia, unspecified: Secondary | ICD-10-CM | POA: Insufficient documentation

## 2016-08-30 DIAGNOSIS — J154 Pneumonia due to other streptococci: Secondary | ICD-10-CM | POA: Insufficient documentation

## 2016-08-30 DIAGNOSIS — E119 Type 2 diabetes mellitus without complications: Secondary | ICD-10-CM

## 2016-08-30 DIAGNOSIS — Z794 Long term (current) use of insulin: Secondary | ICD-10-CM | POA: Insufficient documentation

## 2016-08-30 DIAGNOSIS — Z79899 Other long term (current) drug therapy: Secondary | ICD-10-CM | POA: Insufficient documentation

## 2016-08-30 DIAGNOSIS — R251 Tremor, unspecified: Secondary | ICD-10-CM

## 2016-08-30 DIAGNOSIS — Z9889 Other specified postprocedural states: Secondary | ICD-10-CM | POA: Insufficient documentation

## 2016-08-30 DIAGNOSIS — J188 Other pneumonia, unspecified organism: Secondary | ICD-10-CM | POA: Insufficient documentation

## 2016-08-30 DIAGNOSIS — N186 End stage renal disease: Secondary | ICD-10-CM

## 2016-08-30 DIAGNOSIS — R3129 Other microscopic hematuria: Secondary | ICD-10-CM | POA: Insufficient documentation

## 2016-08-30 DIAGNOSIS — R791 Abnormal coagulation profile: Secondary | ICD-10-CM | POA: Insufficient documentation

## 2016-08-30 DIAGNOSIS — I120 Hypertensive chronic kidney disease with stage 5 chronic kidney disease or end stage renal disease: Secondary | ICD-10-CM | POA: Insufficient documentation

## 2016-08-30 DIAGNOSIS — I10 Essential (primary) hypertension: Secondary | ICD-10-CM

## 2016-08-30 LAB — GLUCOSE, POCT (MANUAL RESULT ENTRY): POC Glucose: 211 mg/dl — AB (ref 70–99)

## 2016-08-30 MED ORDER — METOCLOPRAMIDE HCL 10 MG PO TABS: 10.0000 mg | ORAL_TABLET | Freq: Three times a day (TID) | ORAL | 1 refills | Status: DC | PRN

## 2016-08-30 MED FILL — METOCLOPRAMIDE 10 MG TABLET: 10 | 30 days supply | Qty: 90 | Fill #0

## 2016-08-30 NOTE — Progress Notes (Signed)
Subjective:  Patient ID: Sarah Kelley, female    DOB: Apr 02, 1951  Age: 66 y.o. MRN: [id number]  CC: Anorexia; Diabetes; Hospitalization Follow-up (pneumonia); nauseated and vomiting from food; and dialysis 3 x's weekly   HPI Sarah Kelley is a 66 year old Spanish speaking female seen with the aid of a language line with a history of HTN, uncontrolled Type 2DM(A1c 9.8), Hyperlipidemia, ESRD, Diabetic Neuropathy, Left  foot 3rd toe Osteomyelitis s/p amputation who comes in for a follow up hospitalization at [facility] from 03/28/17 through 08/04/16 for sepsis secondary to Streptococcus pneumonia.  Presented to the ED with confusion and hallucination, In the ED she was septic with fever of 101.72F, tachycardic. Chest x-ray showing lobar opacity concerning for pneumonia. Labs showed BUN of 80 and creatinine of 10.4, bicarbonate of 18 and serum glucose of 297. WBC was elevated to 15.7 K hemoglobin of 8.9. Lactic acid will is normal, mildly elevated INR with UA showing proteinuria and microscopic hematuria. CT abdomen and pelvis given right upper quadrant pain showing small left and moderate right pleural effusion with bilateral lung base consolidation. (R>L) She was placed on IV fluids and empiric antibiotics. Blood culture was positive for strep pneumo, and culture was positive for Escherichia coli, Morganella, CT head was unremarkable for acute infarct, ammonia level was normal. Tunneled Hemodialysis catheter placed due to occluded AV fistula and she was commenced on dialysis during her hospitalization.  She is accompanied by her daughter and husband. Denies fevers, cough or urinary symptoms. Family complains that she has intermittent tremors at rest; she also complains of nausea but no abdominal pain, diarrhea or constipation.  Past Medical History:  Diagnosis Date  . Diabetes mellitus   . Hyperlipidemia   . Hypertension   . Neuropathy ([place])    Diabetic  . Renal disorder   .  Thyroid disease     Past Surgical History:  Procedure Laterality Date  . AMPUTATION Left 05/21/2015   Procedure: Left Foot 3rd Ray Amputation;  Surgeon: Newt Minion, MD;  Location: [facility];  Service: Orthopedics;  Laterality: Left;  . AV FISTULA PLACEMENT Left 03/28/2016   Procedure: ARTERIOVENOUS (AV) FISTULA CREATION LEFT ARM;  Surgeon: Waynetta Sandy, MD;  Location: [facility];  Service: Vascular;  Laterality: Left;  . DILATION AND CURETTAGE OF UTERUS    . IR GENERIC HISTORICAL  08/01/2016   IR FLUORO GUIDE CV LINE RIGHT 08/01/2016 [name], MD [facility]  . IR GENERIC HISTORICAL  08/01/2016   IR US GUIDE VASC ACCESS RIGHT 08/01/2016 [name], MD [facility]    No Known Allergies   Outpatient Medications Prior to Visit  Medication Sig Dispense Refill  . acetaminophen (TYLENOL) 325 MG tablet Take 650 mg by mouth every 6 (six) hours as needed for mild pain. Reported on 09/13/2015    . atorvastatin (LIPITOR) 40 MG tablet Take 1 tablet (40 mg total) by mouth daily. 30 tablet 3  . calcium acetate (PHOSLO) 667 MG capsule Take 2 capsules (1,334 mg total) by mouth 3 (three) times daily with meals. 180 capsule 3  . cetirizine (ZYRTEC) 10 MG tablet Take 1 tablet (10 mg total) by mouth daily. 30 tablet 3  . DULoxetine (CYMBALTA) 60 MG capsule Take 1 capsule (60 mg total) by mouth daily. 30 capsule 3  . gabapentin (NEURONTIN) 300 MG capsule Take 600 mg by mouth daily.  3  . insulin glargine (LANTUS) 100 UNIT/ML injection Inject 0.15 mLs (15 Units total) into the skin at  bedtime. (Patient taking differently: Inject 10 Units into the skin at bedtime. ) 30 mL 3  . valsartan (DIOVAN) 160 MG tablet Take 160 mg by mouth daily.  3   No facility-administered medications prior to visit.     ROS Review of Systems Constitutional: Negative for activity change, appetite change and fatigue.  HENT:. Negative for congestion, sinus pressure and sore throat.   Eyes:  Negative for visual  disturbance.  Respiratory: Negative for cough, chest tightness, shortness of breath and wheezing.   Cardiovascular: Negative for chest pain and palpitations.  Gastrointestinal: Negative for abdominal distention, abdominal pain and constipation.Positive for nausea  Endocrine: Negative for polydipsia.  Genitourinary: Negative for dysuria and frequency.  Musculoskeletal: Negative for arthralgias and back pain.  Skin: Negative for rash.  Neurological: Positive for tremors, negative for light-headedness and numbness.  Hematological: Does not bruise/bleed easily.  Psychiatric/Behavioral: Negative for agitation and behavioral problems.   Objective:  BP (!) 143/70 (BP Location: Right Arm, Patient Position: Sitting, Cuff Size: Small)   Pulse (!) 109   Temp 98.3 F (36.8 C) (Oral)   Ht 5' (1.524 m)   Wt 143 lb 6.4 oz (65 kg)   LMP  (LMP Unknown)   SpO2 96%   BMI 28.01 kg/m   BP/Weight 08/30/2016 10/19/3788 08/07/971  Systolic BP 532 992 426  Diastolic BP 70 61 49  Wt. (Lbs) 143.4 159 159.39  BMI 28.01 30.04 30.12      Physical Exam Constitutional: Patient appears well-developed and well-nourished. No distress. HEENT: Erythema of conjunctiva of the right eye; eyes normal. TM is normal bilaterally, normal oropharynx  CVS: Tachycardic rate, regular rhythm, S1/S2 +, no murmurs, no gallops, no carotid bruit.  Pulmonary: Effort and breath sounds normal, no stridor, rhonchi, wheezes, rales.  Abdominal: Soft. BS +,  no distension, tenderness, rebound or guarding.  Musculoskeletal: Left brachiocephalic AV fistula - occluded Lymphadenopathy: No lymphadenopathy noted, cervical, inguinal or axillary Neuro: Alert. Normal reflexes, muscle tone coordination. No cranial nerve deficit. Skin: Skin is warm and dry. No rash noted. Not diaphoretic. No erythema. No pallor. Psychiatric: Normal mood and affect. Behavior, judgment, thought content normal.  Lab Results  Component Value Date   HGBA1C 9.8  06/12/2016    Assessment & Plan:   1. Diabetes mellitus without complication ([place]) Uncontrolled with A1c of 9.8 No regimen adjustment at this time as she is high risk for Hypoglycemia due to ESRD Hba1c at next visit - Glucose (CBG)  2. Occasional tremors Will send off TSH Could also be Hemodialysis  3. Essential hypertension Slightly elevated No regimen change; we'll need to exercise caution due to risk of hypotension during dialysis.  4. Pneumonia due to infectious organism, unspecified laterality, unspecified part of lung Completed course of antibiotic Resolved  5. Non-intractable vomiting with nausea, unspecified vomiting type Could be hemodialysis related - metoCLOPramide (REGLAN) 10 MG tablet; Take 1 tablet (10 mg total) by mouth every 8 (eight) hours as needed for nausea.  Dispense: 90 tablet; Refill: 1  6. ESRD (end stage renal disease) ([place]) Continue hemodialysis as per schedule-Tuesdays, Thursdays and Saturdays.   Meds ordered this encounter  Medications  . metoCLOPramide (REGLAN) 10 MG tablet    Sig: Take 1 tablet (10 mg total) by mouth every 8 (eight) hours as needed for nausea.    Dispense:  90 tablet    Refill:  1    Follow-up: Return in about 6 weeks (around 10/11/2016) for Follow-up diabetes mellitus.   Arnoldo Morale MD

## 2016-09-01 ENCOUNTER — Encounter (HOSPITAL_COMMUNITY): Payer: Self-pay | Admitting: *Deleted

## 2016-09-01 NOTE — Progress Notes (Signed)
SDW call completed using Vivianne Spence , [place] # [id number]. It was reported that am CBG's run 120- 130.  I instructed patient to check CBG to check CBG and if it is less than 70 to treat it with Glucose Gel, Glucose tablets or 1/2 cup of clear juice like apple juice or cranberry juice, or 1/2 cup of regular soda. (not cream soda). I instructed patient to recheck CBG in 15 minutes and if CBG is not greater than 70, to  Call [phone] (pre- op). If it is before pre-op opens to retreat as before and recheck CBG in 15 minutes. I told patient to make note of time that liquid is taken and amount, that surgical time may have to be adjusted.

## 2016-09-04 ENCOUNTER — Telehealth: Payer: Self-pay | Admitting: Vascular Surgery

## 2016-09-04 ENCOUNTER — Ambulatory Visit (HOSPITAL_COMMUNITY): Payer: Self-pay | Admitting: Anesthesiology

## 2016-09-04 ENCOUNTER — Encounter (HOSPITAL_COMMUNITY): Payer: Self-pay | Admitting: Anesthesiology

## 2016-09-04 ENCOUNTER — Ambulatory Visit (HOSPITAL_COMMUNITY)
Admission: RE | Admit: 2016-09-04 | Discharge: 2016-09-04 | Disposition: A | Discharge status: Home / Self Care | Payer: Self-pay | Source: Ambulatory Visit | Attending: Vascular Surgery | Admitting: Vascular Surgery

## 2016-09-04 ENCOUNTER — Encounter (HOSPITAL_COMMUNITY)
Admission: RE | Disposition: A | Discharge status: Home / Self Care | Payer: Self-pay | Source: Ambulatory Visit | Attending: Vascular Surgery

## 2016-09-04 DIAGNOSIS — Z992 Dependence on renal dialysis: Secondary | ICD-10-CM | POA: Insufficient documentation

## 2016-09-04 DIAGNOSIS — E079 Disorder of thyroid, unspecified: Secondary | ICD-10-CM | POA: Insufficient documentation

## 2016-09-04 DIAGNOSIS — N186 End stage renal disease: Secondary | ICD-10-CM | POA: Insufficient documentation

## 2016-09-04 DIAGNOSIS — G709 Myoneural disorder, unspecified: Secondary | ICD-10-CM | POA: Insufficient documentation

## 2016-09-04 DIAGNOSIS — D649 Anemia, unspecified: Secondary | ICD-10-CM | POA: Insufficient documentation

## 2016-09-04 DIAGNOSIS — E1140 Type 2 diabetes mellitus with diabetic neuropathy, unspecified: Secondary | ICD-10-CM | POA: Insufficient documentation

## 2016-09-04 DIAGNOSIS — T82898A Other specified complication of vascular prosthetic devices, implants and grafts, initial encounter: Secondary | ICD-10-CM | POA: Insufficient documentation

## 2016-09-04 DIAGNOSIS — E1122 Type 2 diabetes mellitus with diabetic chronic kidney disease: Secondary | ICD-10-CM | POA: Insufficient documentation

## 2016-09-04 DIAGNOSIS — E7800 Pure hypercholesterolemia, unspecified: Secondary | ICD-10-CM

## 2016-09-04 DIAGNOSIS — Z7982 Long term (current) use of aspirin: Secondary | ICD-10-CM | POA: Insufficient documentation

## 2016-09-04 DIAGNOSIS — Z89432 Acquired absence of left foot: Secondary | ICD-10-CM | POA: Insufficient documentation

## 2016-09-04 DIAGNOSIS — I120 Hypertensive chronic kidney disease with stage 5 chronic kidney disease or end stage renal disease: Secondary | ICD-10-CM | POA: Insufficient documentation

## 2016-09-04 DIAGNOSIS — X58XXXA Exposure to other specified factors, initial encounter: Secondary | ICD-10-CM | POA: Insufficient documentation

## 2016-09-04 DIAGNOSIS — Z79899 Other long term (current) drug therapy: Secondary | ICD-10-CM | POA: Insufficient documentation

## 2016-09-04 HISTORY — DX: Pneumonia, unspecified organism: J18.9

## 2016-09-04 HISTORY — PX: FISTULA SUPERFICIALIZATION: SHX6341

## 2016-09-04 LAB — GLUCOSE, CAPILLARY: GLUCOSE-CAPILLARY: 130 mg/dL — AB (ref 65–99)

## 2016-09-04 LAB — POCT I-STAT 4, (NA,K, GLUC, HGB,HCT)
GLUCOSE: 145 mg/dL — AB (ref 65–99)
HEMATOCRIT: 29 % — AB (ref 36.0–46.0)
HEMOGLOBIN: 9.9 g/dL — AB (ref 12.0–15.0)
Potassium: 3 mmol/L — ABNORMAL LOW (ref 3.5–5.1)
Sodium: 137 mmol/L (ref 135–145)

## 2016-09-04 SURGERY — FISTULA SUPERFICIALIZATION
Anesthesia: General | Site: Arm Upper | Laterality: Left

## 2016-09-04 MED ORDER — PROPOFOL 10 MG/ML IV BOLUS
INTRAVENOUS | Status: DC | PRN
  Administered 2016-09-04: 100 mg via INTRAVENOUS

## 2016-09-04 MED ORDER — CHLORHEXIDINE GLUCONATE CLOTH 2 % EX PADS: 6.0000 | MEDICATED_PAD | Freq: Once | CUTANEOUS | Status: DC

## 2016-09-04 MED ORDER — FENTANYL CITRATE (PF) 100 MCG/2ML IJ SOLN
INTRAMUSCULAR | Status: AC
  Filled 2016-09-04: qty 4

## 2016-09-04 MED ORDER — PHENYLEPHRINE HCL 10 MG/ML IJ SOLN
INTRAVENOUS | Status: DC | PRN
  Administered 2016-09-04: 50 ug/min via INTRAVENOUS

## 2016-09-04 MED ORDER — LIDOCAINE-EPINEPHRINE (PF) 1 %-1:200000 IJ SOLN
INTRAMUSCULAR | Status: AC
  Filled 2016-09-04: qty 30

## 2016-09-04 MED ORDER — ONDANSETRON HCL 4 MG/2ML IJ SOLN
INTRAMUSCULAR | Status: DC | PRN
  Administered 2016-09-04: 4 mg via INTRAVENOUS

## 2016-09-04 MED ORDER — MIDAZOLAM HCL 2 MG/2ML IJ SOLN
INTRAMUSCULAR | Status: AC
  Filled 2016-09-04: qty 2

## 2016-09-04 MED ORDER — DEXTROSE 5 % IV SOLN
1.5000 g | INTRAVENOUS | Status: AC
  Administered 2016-09-04: 1.5 g via INTRAVENOUS
  Filled 2016-09-04: qty 1.5

## 2016-09-04 MED ORDER — PROPOFOL 10 MG/ML IV BOLUS
INTRAVENOUS | Status: AC
  Filled 2016-09-04: qty 20

## 2016-09-04 MED ORDER — INSULIN GLARGINE 100 UNIT/ML ~~LOC~~ SOLN: 10.0000 [IU] | Freq: Every day | SUBCUTANEOUS | Status: DC

## 2016-09-04 MED ORDER — ONDANSETRON HCL 4 MG/2ML IJ SOLN: 4.0000 mg | Freq: Once | INTRAMUSCULAR | Status: DC | PRN

## 2016-09-04 MED ORDER — LIDOCAINE-EPINEPHRINE (PF) 1 %-1:200000 IJ SOLN
INTRAMUSCULAR | Status: DC | PRN
Start: 2016-09-04 — End: 2016-09-04
  Administered 2016-09-04: 30 mL

## 2016-09-04 MED ORDER — OXYCODONE HCL 5 MG PO TABS
5.0000 mg | ORAL_TABLET | Freq: Once | ORAL | Status: AC | PRN
  Administered 2016-09-04: 5 mg via ORAL

## 2016-09-04 MED ORDER — OXYCODONE HCL 5 MG/5ML PO SOLN: 5.0000 mg | Freq: Once | ORAL | Status: AC | PRN

## 2016-09-04 MED ORDER — FENTANYL CITRATE (PF) 100 MCG/2ML IJ SOLN
INTRAMUSCULAR | Status: DC | PRN
  Administered 2016-09-04: 100 ug via INTRAVENOUS

## 2016-09-04 MED ORDER — SODIUM CHLORIDE 0.9 % IV SOLN
INTRAVENOUS | Status: DC
  Administered 2016-09-04 (×3): via INTRAVENOUS

## 2016-09-04 MED ORDER — FENTANYL CITRATE (PF) 100 MCG/2ML IJ SOLN
25.0000 ug | INTRAMUSCULAR | Status: DC | PRN
  Administered 2016-09-04: 25 ug via INTRAVENOUS

## 2016-09-04 MED ORDER — MIDAZOLAM HCL 5 MG/5ML IJ SOLN
INTRAMUSCULAR | Status: DC | PRN
  Administered 2016-09-04: .5 mg via INTRAVENOUS

## 2016-09-04 MED ORDER — LIDOCAINE HCL (CARDIAC) 20 MG/ML IV SOLN
INTRAVENOUS | Status: DC | PRN
  Administered 2016-09-04: 40 mg via INTRAVENOUS

## 2016-09-04 MED ORDER — OXYCODONE HCL 5 MG PO TABS
ORAL_TABLET | ORAL | Status: AC
  Filled 2016-09-04: qty 1

## 2016-09-04 MED ORDER — FENTANYL CITRATE (PF) 100 MCG/2ML IJ SOLN
INTRAMUSCULAR | Status: AC
  Filled 2016-09-04: qty 2

## 2016-09-04 MED ORDER — SODIUM CHLORIDE 0.9 % IV SOLN
INTRAVENOUS | Status: DC | PRN
  Administered 2016-09-04: 08:00:00

## 2016-09-04 MED ORDER — 0.9 % SODIUM CHLORIDE (POUR BTL) OPTIME
TOPICAL | Status: DC | PRN
  Administered 2016-09-04: 1000 mL

## 2016-09-04 MED ORDER — OXYCODONE-ACETAMINOPHEN 5-325 MG PO TABS: 1.0000 | ORAL_TABLET | Freq: Four times a day (QID) | ORAL | 0 refills | Status: DC | PRN

## 2016-09-04 SURGICAL SUPPLY — 32 items
ADH SKN CLS APL DERMABOND .7 (GAUZE/BANDAGES/DRESSINGS) ×1
ARMBAND PINK RESTRICT EXTREMIT (MISCELLANEOUS) ×3 IMPLANT
CANISTER SUCT 3000ML PPV (MISCELLANEOUS) ×3 IMPLANT
CLIP TI MEDIUM 6 (CLIP) ×3 IMPLANT
CLIP TI WIDE RED SMALL 6 (CLIP) ×3 IMPLANT
COVER PROBE W GEL 5X96 (DRAPES) ×3 IMPLANT
DERMABOND ADVANCED (GAUZE/BANDAGES/DRESSINGS) ×2
DERMABOND ADVANCED .7 DNX12 (GAUZE/BANDAGES/DRESSINGS) ×1 IMPLANT
ELECT REM PT RETURN 9FT ADLT (ELECTROSURGICAL) ×3
ELECTRODE REM PT RTRN 9FT ADLT (ELECTROSURGICAL) ×1 IMPLANT
GLOVE BIO SURGEON STRL SZ 6.5 (GLOVE) ×2 IMPLANT
GLOVE BIO SURGEON STRL SZ7.5 (GLOVE) ×7 IMPLANT
GLOVE BIO SURGEONS STRL SZ 6.5 (GLOVE) ×2
GLOVE BIOGEL PI IND STRL 7.0 (GLOVE) IMPLANT
GLOVE BIOGEL PI INDICATOR 7.0 (GLOVE) ×6
GOWN STRL REUS W/ TWL LRG LVL3 (GOWN DISPOSABLE) ×2 IMPLANT
GOWN STRL REUS W/ TWL XL LVL3 (GOWN DISPOSABLE) ×1 IMPLANT
GOWN STRL REUS W/TWL LRG LVL3 (GOWN DISPOSABLE) ×6
GOWN STRL REUS W/TWL XL LVL3 (GOWN DISPOSABLE) ×6
KIT BASIN OR (CUSTOM PROCEDURE TRAY) ×3 IMPLANT
KIT ROOM TURNOVER OR (KITS) ×3 IMPLANT
NS IRRIG 1000ML POUR BTL (IV SOLUTION) ×3 IMPLANT
PACK CV ACCESS (CUSTOM PROCEDURE TRAY) ×3 IMPLANT
PAD ARMBOARD 7.5X6 YLW CONV (MISCELLANEOUS) ×6 IMPLANT
SUT MNCRL AB 4-0 PS2 18 (SUTURE) ×3 IMPLANT
SUT PROLENE 6 0 BV (SUTURE) ×7 IMPLANT
SUT VIC AB 2-0 CT1 27 (SUTURE) ×3
SUT VIC AB 2-0 CT1 TAPERPNT 27 (SUTURE) IMPLANT
SUT VIC AB 3-0 SH 27 (SUTURE) ×3
SUT VIC AB 3-0 SH 27X BRD (SUTURE) ×1 IMPLANT
UNDERPAD 30X30 (UNDERPADS AND DIAPERS) ×3 IMPLANT
WATER STERILE IRR 1000ML POUR (IV SOLUTION) ×3 IMPLANT

## 2016-09-04 NOTE — H&P (Signed)
     History and Physical Update  The patient was interviewed and re-examined.  The patient's previous History and Physical has been reviewed and is unchanged from office visit. Plan for superficialization of left upper arm avf.   Sarah Kelley C. Donzetta Matters, MD [facility] Office: [phone] Pager: [phone]  09/04/2016, 7:23 AM

## 2016-09-04 NOTE — Anesthesia Preprocedure Evaluation (Addendum)
Anesthesia Evaluation  Patient identified by MRN, date of birth, ID band Patient awake    Reviewed: Allergy & Precautions, NPO status , Patient's Chart, lab work & pertinent test results  Airway Mallampati: II  TM Distance: >3 FB Neck ROM: full    Dental  (+) Dental Advidsory Given, Partial Upper   Pulmonary pneumonia, resolved,    breath sounds clear to auscultation       Cardiovascular hypertension,  Rhythm:Regular Rate:Normal     Neuro/Psych PSYCHIATRIC DISORDERS  Neuromuscular disease    GI/Hepatic   Endo/Other  diabetes  Renal/GU ESRF and DialysisRenal disease     Musculoskeletal   Abdominal   Peds  Hematology  (+) anemia ,   Anesthesia Other Findings Spoke through interpretor for anesthesia interview  Reproductive/Obstetrics                           Anesthesia Physical Anesthesia Plan  ASA: III  Anesthesia Plan: General   Post-op Pain Management:    Induction: Intravenous  Airway Management Planned: LMA  Additional Equipment:   Intra-op Plan:   Post-operative Plan:   Informed Consent: I have reviewed the patients History and Physical, chart, labs and discussed the procedure including the risks, benefits and alternatives for the proposed anesthesia with the patient or authorized representative who has indicated his/her understanding and acceptance.   Dental Advisory Given  Plan Discussed with: CRNA, Anesthesiologist and Surgeon  Anesthesia Plan Comments:        Anesthesia Quick Evaluation

## 2016-09-04 NOTE — Anesthesia Procedure Notes (Signed)
Procedure Name: LMA Insertion Date/Time: 09/04/2016 7:46 AM Performed by: Neldon Newport Pre-anesthesia Checklist: Timeout performed, Patient being monitored, Suction available, Emergency Drugs available and Patient identified Patient Re-evaluated:Patient Re-evaluated prior to inductionOxygen Delivery Method: Circle system utilized Preoxygenation: Pre-oxygenation with 100% oxygen Intubation Type: IV induction Ventilation: Mask ventilation without difficulty LMA: LMA inserted LMA Size: 4.0 Number of attempts: 1 Placement Confirmation: breath sounds checked- equal and bilateral and positive ETCO2 Tube secured with: Tape Dental Injury: Teeth and Oropharynx as per pre-operative assessment

## 2016-09-04 NOTE — Transfer of Care (Signed)
Immediate Anesthesia Transfer of Care Note  Patient: Sarah Kelley  Procedure(s) Performed: Procedure(s): FISTULA SUPERFICIALIZATION LEFT UPPER ARM (Left)  Patient Location: PACU  Anesthesia Type:General  Level of Consciousness: sedated  Airway & Oxygen Therapy: Patient Spontanous Breathing and Patient connected to nasal cannula oxygen  Post-op Assessment: Report given to RN, Post -op Vital signs reviewed and stable and Patient moving all extremities X 4  Post vital signs: Reviewed and stable  Last Vitals:  Vitals:   09/04/16 0605 09/04/16 0854  BP: (!) 153/48   Pulse: 84 82  Resp: 20 12  Temp: 36.8 C 36.4 C    Last Pain:  Vitals:   09/04/16 0605  TempSrc: Oral         Complications: none

## 2016-09-04 NOTE — Telephone Encounter (Signed)
-----   Message from Mena Goes, RN sent at 09/04/2016  9:02 AM EST ----- Regarding: 2-4 weeks to discuss plan   ----- Message ----- From: Alvia Grove, PA-C Sent: 09/04/2016   8:51 AM To: [facility] Charge Pool  S/p superficialization left upper arm fistula 09/04/16  F/u with Dr. Donzetta Matters in 2-4 weeks. May need to discuss fistulogram.   Thanks Maudie Mercury

## 2016-09-04 NOTE — Telephone Encounter (Signed)
Attempted call, will call back with interpretor  appt 4/6 for discussion of Fistulogram  Mailed letter

## 2016-09-04 NOTE — Progress Notes (Addendum)
Spoke with Dr. Linna Caprice, re: repeat EKG, last one 2016.  No need to repeat, per . Linna Caprice

## 2016-09-04 NOTE — Op Note (Signed)
    Patient name: Sarah Kelley MRN: [id number] DOB: [birth year] Sex: female  09/04/2016 Pre-operative Diagnosis: esrd, non-functional left arm brachiocephalic avf Post-operative diagnosis:  Same Surgeon:  Eda Paschal. Donzetta Matters, MD Assistant: Silva Bandy, PA Procedure Performed:  Left upper arm avf superficialization and branch ligation  Indications:  66 year old female with end-stage renal disease now dialyzing via a tunneled dialysis catheter had a left arm AV fistula for 6 months that has been too deep to cannulate. She is now indicated for superficial invasion and branch ligation.  Findings: The fistula in the midportion was 1.1 cm deep and 0.5 cm in diameter. There were 2 large branches identified in the midportion. These were ligated between ties. The fistula itself had strong thrill throughout but may be somewhat diminutive for use. At completion it was just under the surface of the skin there was a signal at the ulnar artery with very weak signal at the radial consistent with preoperative exam.   Procedure:  The patient was identified in the holding area and taken to the operating room where she was placed supine on the operating table general anesthesia was induced she was sterilely prepped and draped in the left arm for access surgery given antibiotics and timeout called. Began by instilling 1% lidocaine throughout the left upper extremity and then marking out the fistula with ultrasound. We did identify 2 large branches and these were also marked. Measurements above were taken we then made a 5 cm incision in the midportion of the upper arm overlying the fistula dissected down onto it. Traced proximally and distally for several centimeters and bluntly extended the skin overlying the fistula. Branches were taken between 3-0 silk ties. There was one area more cephalad where a small branch was avulsed and required repair with 6-0 Prolene suture. We then irrigated the wound obtained hemostasis and  closed the subcutaneous tissue with 2-0 Vicryl suture to lift the fistula to the surface. The skin was then closed with 4-0 Monocryl directly over the surface of the fistula. Doppler signals demonstrated the above findings with dominant runoff to her hand via the or artery. Satisfied with this Dermabond was placed and skin she is laterally from anesthesia having tolerated the procedure without immediate complication.    Kyng Matlock C. Donzetta Matters, MD [facility] Office: [phone] Pager: [phone]

## 2016-09-04 NOTE — Anesthesia Postprocedure Evaluation (Signed)
Anesthesia Post Note  Patient: Sarah Kelley  Procedure(s) Performed: Procedure(s) (LRB): FISTULA SUPERFICIALIZATION LEFT UPPER ARM (Left)  Patient location during evaluation: PACU Anesthesia Type: General Level of consciousness: awake and awake and alert Pain management: pain level controlled Vital Signs Assessment: post-procedure vital signs reviewed and stable Respiratory status: spontaneous breathing, nonlabored ventilation and respiratory function stable Cardiovascular status: blood pressure returned to baseline Postop Assessment: no headache Anesthetic complications: no       Last Vitals:  Vitals:   09/04/16 0948 09/04/16 1006  BP:  (!) 117/53  Pulse: 98 98  Resp: 12 14  Temp:      Last Pain:  Vitals:   09/04/16 1006  TempSrc:   PainSc: 2                  Sheron Tallman COKER

## 2016-09-05 ENCOUNTER — Encounter (HOSPITAL_COMMUNITY): Payer: Self-pay | Admitting: Vascular Surgery

## 2016-09-12 MED FILL — DULoxetine HCL 60 MG CPEP: 60 | 30 days supply | Qty: 30 | Fill #2

## 2016-09-26 ENCOUNTER — Encounter: Payer: Self-pay | Admitting: Vascular Surgery

## 2016-10-02 MED FILL — GABAPENTIN 300 MG CAPSULE: 300 | 30 days supply | Qty: 60 | Fill #3

## 2016-10-02 MED FILL — VALSARTAN 160 MG TABLET: 160 | 30 days supply | Qty: 30 | Fill #2

## 2016-10-02 MED FILL — ATORVASTATIN 40 MG TABLET: 40 | 30 days supply | Qty: 30 | Fill #2

## 2016-10-06 ENCOUNTER — Ambulatory Visit (INDEPENDENT_AMBULATORY_CARE_PROVIDER_SITE_OTHER): Payer: Self-pay | Admitting: Vascular Surgery

## 2016-10-06 ENCOUNTER — Encounter: Payer: Self-pay | Admitting: Vascular Surgery

## 2016-10-06 VITALS — BP 115/63 | HR 85 | Temp 99.8°F | Resp 16 | Ht 61.0 in | Wt 143.0 lb

## 2016-10-06 DIAGNOSIS — N186 End stage renal disease: Secondary | ICD-10-CM

## 2016-10-06 DIAGNOSIS — Z992 Dependence on renal dialysis: Secondary | ICD-10-CM

## 2016-10-06 NOTE — Progress Notes (Signed)
Subjective:     Patient ID: Sarah Kelley, female   DOB: April 24, 1951, 66 y.o.   MRN: [id number]  HPI 66 year old female follows up from recent superficialization of her left upper arm AV fistula as well as branch ligation. At operation he was noted to be approximately 0.5 cm in diameter and somewhat diminutive at completion. Overall she is not doing well. She has persistent weakness and fatigue and is not using for a multitude of reasons mostly decreased appetite. She also complains of left ear pain. Her major complaint is also right shoulder pain because she is laying on that side. She denies left hand pain at this time. Currently dialyzing Tuesday Thursday Saturday via right IJ tunneled dialysis catheter.   Review of Systems Left ear pain Right shoulder pain Generalized weakness Decreased appetite    Objective:   Physical Exam Awake and alert Left arm with palpable upper arm avf Cannot palpate left radial artery, left ulnar and palmar arch signals Incision healing well   I performed a bedside ultrasound that demonstrated 0.75 cm fistula above the antecubitum and 0.65 cm below the incision. Depth was hard to obtain given recent incision.    Assessment/plan     66 year old female returns following superficialization of her left upper arm AV fistula. He does have adequate size palpable thrill for use. At this time it should be okay to begin cannulating we are month out from her recent incision. If they are unable to use this she will need to be set up for fistulogram and possible balloon dilatation. She remains high risk for primary nonfunction of this fistula despite repeated attempts at improving it. If they are able to cannulate it we can remove her catheter after a few dialysis runs.    Merie Wulf C. Donzetta Matters, MD [facility] Office: [phone] Pager: [phone]

## 2016-10-11 ENCOUNTER — Ambulatory Visit: Payer: Self-pay | Attending: Family Medicine | Admitting: Family Medicine

## 2016-10-11 ENCOUNTER — Encounter: Payer: Self-pay | Admitting: Family Medicine

## 2016-10-11 VITALS — BP 148/72 | HR 95 | Temp 98.0°F | Ht 65.0 in | Wt 127.0 lb

## 2016-10-11 DIAGNOSIS — N186 End stage renal disease: Secondary | ICD-10-CM | POA: Insufficient documentation

## 2016-10-11 DIAGNOSIS — Z79899 Other long term (current) drug therapy: Secondary | ICD-10-CM | POA: Insufficient documentation

## 2016-10-11 DIAGNOSIS — Z9889 Other specified postprocedural states: Secondary | ICD-10-CM | POA: Insufficient documentation

## 2016-10-11 DIAGNOSIS — E079 Disorder of thyroid, unspecified: Secondary | ICD-10-CM | POA: Insufficient documentation

## 2016-10-11 DIAGNOSIS — I120 Hypertensive chronic kidney disease with stage 5 chronic kidney disease or end stage renal disease: Secondary | ICD-10-CM | POA: Insufficient documentation

## 2016-10-11 DIAGNOSIS — E1140 Type 2 diabetes mellitus with diabetic neuropathy, unspecified: Secondary | ICD-10-CM | POA: Insufficient documentation

## 2016-10-11 DIAGNOSIS — E1165 Type 2 diabetes mellitus with hyperglycemia: Secondary | ICD-10-CM | POA: Insufficient documentation

## 2016-10-11 DIAGNOSIS — E119 Type 2 diabetes mellitus without complications: Secondary | ICD-10-CM

## 2016-10-11 DIAGNOSIS — F0390 Unspecified dementia without behavioral disturbance: Secondary | ICD-10-CM | POA: Insufficient documentation

## 2016-10-11 DIAGNOSIS — I10 Essential (primary) hypertension: Secondary | ICD-10-CM

## 2016-10-11 DIAGNOSIS — E7800 Pure hypercholesterolemia, unspecified: Secondary | ICD-10-CM | POA: Insufficient documentation

## 2016-10-11 DIAGNOSIS — H04203 Unspecified epiphora, bilateral lacrimal glands: Secondary | ICD-10-CM

## 2016-10-11 DIAGNOSIS — R0982 Postnasal drip: Secondary | ICD-10-CM | POA: Insufficient documentation

## 2016-10-11 DIAGNOSIS — H5461 Unqualified visual loss, right eye, normal vision left eye: Secondary | ICD-10-CM | POA: Insufficient documentation

## 2016-10-11 DIAGNOSIS — F05 Delirium due to known physiological condition: Secondary | ICD-10-CM | POA: Insufficient documentation

## 2016-10-11 DIAGNOSIS — K5900 Constipation, unspecified: Secondary | ICD-10-CM | POA: Insufficient documentation

## 2016-10-11 DIAGNOSIS — G4700 Insomnia, unspecified: Secondary | ICD-10-CM | POA: Insufficient documentation

## 2016-10-11 DIAGNOSIS — H9203 Otalgia, bilateral: Secondary | ICD-10-CM | POA: Insufficient documentation

## 2016-10-11 DIAGNOSIS — E1122 Type 2 diabetes mellitus with diabetic chronic kidney disease: Secondary | ICD-10-CM | POA: Insufficient documentation

## 2016-10-11 DIAGNOSIS — J154 Pneumonia due to other streptococci: Secondary | ICD-10-CM | POA: Insufficient documentation

## 2016-10-11 DIAGNOSIS — Z794 Long term (current) use of insulin: Secondary | ICD-10-CM | POA: Insufficient documentation

## 2016-10-11 DIAGNOSIS — E1149 Type 2 diabetes mellitus with other diabetic neurological complication: Secondary | ICD-10-CM

## 2016-10-11 LAB — GLUCOSE, POCT (MANUAL RESULT ENTRY): POC Glucose: 112 mg/dl — AB (ref 70–99)

## 2016-10-11 LAB — POCT GLYCOSYLATED HEMOGLOBIN (HGB A1C): Hemoglobin A1C: 7.4

## 2016-10-11 MED ORDER — ATORVASTATIN CALCIUM 40 MG PO TABS: 40.0000 mg | ORAL_TABLET | Freq: Every day | ORAL | 5 refills | Status: DC

## 2016-10-11 MED ORDER — CETIRIZINE HCL 10 MG PO TABS: 10.0000 mg | ORAL_TABLET | Freq: Every day | ORAL | 5 refills | Status: DC

## 2016-10-11 MED ORDER — OLOPATADINE HCL 0.1 % OP SOLN: 1.0000 [drp] | Freq: Two times a day (BID) | OPHTHALMIC | 2 refills | Status: DC

## 2016-10-11 MED ORDER — INSULIN GLARGINE 100 UNIT/ML ~~LOC~~ SOLN: 10.0000 [IU] | Freq: Every day | SUBCUTANEOUS | 5 refills | Status: DC

## 2016-10-11 MED ORDER — DULOXETINE HCL 60 MG PO CPEP: 60.0000 mg | ORAL_CAPSULE | Freq: Every day | ORAL | 5 refills | Status: DC

## 2016-10-11 MED FILL — OLOPATADINE HCL 0.1% EYE DR: 0.1 | 30 days supply | Qty: 5 | Fill #0

## 2016-10-11 MED FILL — DULoxetine HCL 60 MG CPEP: 60 | 30 days supply | Qty: 30 | Fill #0

## 2016-10-11 MED FILL — ?CETIRIZINE HCL 10 MG TABLE: 10 | 30 days supply | Qty: 30 | Fill #0

## 2016-10-11 MED FILL — LIDOCAINE-PRILOCAINE CREAM: 2.5-2.5 | 30 days supply | Qty: 30 | Fill #0

## 2016-10-11 NOTE — Progress Notes (Signed)
Subjective:  Patient ID: Sarah Kelley, female    DOB: [birth year]  Age: 66 y.o. MRN: [id number]  CC: Diabetes; Ear Pain (bilateral); tearing (bilateral); and disoriented   HPI Sarah Kelley is a 66 year old Spanish speaking female seen with the aid of a language line with a history of HTN, uncontrolled Type 2DM(A1c 7.4 down from 9.8), Hyperlipidemia, ESRD, Diabetic Neuropathy, Left  foot 3rd toe Osteomyelitis s/p amputation Recent hospitalization for sepsis secondary to Streptococcus pneumonia who presents today for follow-up visit.  She has been taking anywhere from 8-12 units of Lantus depending on her blood sugars as her blood sugars are sometimes high and at other times low.  Family complains of the fact that she has had some intermittent constipation which lasts a few seconds but then resolved. She also has some mild memory impairment. She denies fever, abdominal pain or shortness of breath. She has also had some intermittent insomnia off about 2 days a week.  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 Currently takes medication for seasonal allergies.  Past Medical History:  Diagnosis Date  . Diabetes mellitus   . Hyperlipidemia   . Hypertension   . Neuropathy ([place])    Diabetic  . Pneumonia 07/2016  . Renal disorder   . Thyroid disease     Past Surgical History:  Procedure Laterality Date  . AMPUTATION Left 05/21/2015   Procedure: Left Foot 3rd Ray Amputation;  Surgeon: Newt Minion, MD;  Location: [place];  Service: Orthopedics;  Laterality: Left;  . AV FISTULA PLACEMENT Left 03/28/2016   Procedure: ARTERIOVENOUS (AV) FISTULA CREATION LEFT ARM;  Surgeon: Waynetta Sandy, MD;  Location: [place];  Service: Vascular;  Laterality: Left;  . DILATION AND CURETTAGE OF UTERUS    . FISTULA SUPERFICIALIZATION Left 09/04/2016   Procedure: FISTULA SUPERFICIALIZATION LEFT UPPER ARM;  Surgeon: Waynetta Sandy,  MD;  Location: [place];  Service: Vascular;  Laterality: Left;  . IR GENERIC HISTORICAL  08/01/2016   IR FLUORO GUIDE CV LINE RIGHT 08/01/2016 [name], MD [facility]  . IR GENERIC HISTORICAL  08/01/2016   IR US GUIDE VASC ACCESS RIGHT 08/01/2016 [name], MD [facility]     Outpatient Medications Prior to Visit  Medication Sig Dispense Refill  . gabapentin (NEURONTIN) 300 MG capsule Take 600 mg by mouth daily.  3  . valsartan (DIOVAN) 160 MG tablet Take 160 mg by mouth daily.  3  . atorvastatin (LIPITOR) 40 MG tablet Take 1 tablet (40 mg total) by mouth daily. 30 tablet 3  . cetirizine (ZYRTEC) 10 MG tablet Take 1 tablet (10 mg total) by mouth daily. 30 tablet 3  . DULoxetine (CYMBALTA) 60 MG capsule Take 1 capsule (60 mg total) by mouth daily. 30 capsule 3  . insulin glargine (LANTUS) 100 UNIT/ML injection Inject 0.1 mLs (10 Units total) into the skin at bedtime.    . calcium acetate (PHOSLO) 667 MG capsule Take 2 capsules (1,334 mg total) by mouth 3 (three) times daily with meals. (Patient not taking: Reported on 10/06/2016) 180 capsule 3   No facility-administered medications prior to visit.     ROS Review of Systems  Constitutional: Negative for activity change, appetite change and fatigue.  HENT: Negative for congestion, sinus pressure and sore throat.   Eyes: Positive for discharge and visual disturbance. Negative for pain and redness.  Respiratory: Negative for cough, chest tightness, shortness of breath and  wheezing.   Cardiovascular: Negative for chest pain and palpitations.  Gastrointestinal: Negative for abdominal distention, abdominal pain and constipation.  Endocrine: Negative for polydipsia.  Genitourinary: Negative for dysuria and frequency.  Musculoskeletal: Negative for arthralgias and back pain.  Skin: Negative for rash.  Neurological: Negative for tremors, light-headedness and numbness.  Hematological: Does not bruise/bleed easily.  Psychiatric/Behavioral:  Positive for confusion and sleep disturbance. Negative for agitation, behavioral problems and hallucinations.    Objective:  BP (!) 148/72 (BP Location: Right Arm, Patient Position: Sitting, Cuff Size: Large)   Pulse 95   Temp 98 F (36.7 C) (Oral)   Ht 5\' 5"  (1.651 m)   Wt 127 lb (57.6 kg)   LMP  (LMP Unknown)   SpO2 98%   BMI 21.13 kg/m   BP/Weight 10/11/2016 3/0/8657 02/03/6961  Systolic BP 952 841 324  Diastolic BP 72 63 53  Wt. (Lbs) 127 143 -  BMI 21.13 27.02 -      Physical Exam Constitutional: Patient appears well-developed and well-nourished. No distress. HEENT: Tearing of R eye, blind in R eye. TM is normal bilaterally, normal oropharynx  CVS: Tachycardic rate, regular rhythm, S1/S2 +, no murmurs, no gallops, no carotid bruit.  Pulmonary: Effort and breath sounds normal, no stridor, rhonchi, wheezes, rales.  Abdominal: Soft. BS +,  no distension, tenderness, rebound or guarding.  Musculoskeletal: Left brachiocephalic AV fistula - occluded Lymphadenopathy: No lymphadenopathy noted, cervical, inguinal or axillary Neuro: Alert. Normal reflexes, muscle tone coordination. No cranial nerve deficit. Skin: Skin is warm and dry. No rash noted. Not diaphoretic. No erythema. No pallor. Psychiatric: Normal mood and affect. Behavior, judgment, thought content normal.  Lab Results  Component Value Date   HGBA1C 7.4 10/11/2016    Assessment & Plan:   1. Insulin-requiring or dependent type II diabetes mellitus ([place]) Improved with A1c of 7.4 We have discussed consistency of taking Lantus and advised that in the event of hypoglycemia this can be reduced by 2 units but she is to avoid taking varying doses of Lantus. We have discussed the options of NovoLog sliding scale which the family declines. - Glucose (CBG) - HgB A1c  2. Other diabetic neurological complication associated with type 2 diabetes mellitus ([place]) Also on gabapentin - DULoxetine (CYMBALTA) 60 MG capsule; Take 1  capsule (60 mg total) by mouth daily.  Dispense: 30 capsule; Refill: 5  3. Post-nasal drip Stable - cetirizine (ZYRTEC) 10 MG tablet; Take 1 tablet (10 mg total) by mouth daily.  Dispense: 30 tablet; Refill: 5  4. Pure hypercholesterolemia Low cholesterol diet - atorvastatin (LIPITOR) 40 MG tablet; Take 1 tablet (40 mg total) by mouth daily.  Dispense: 30 tablet; Refill: 5 - insulin glargine (LANTUS) 100 UNIT/ML injection; Inject 0.1 mLs (10 Units total) into the skin at bedtime.  Dispense: 10 mL; Refill: 5  5. ESRD (end stage renal disease) ([place]) Hemodialysis as per protocol  6. Tearing eyes Secondary to seasonal allergies She is blind in the right eye but is unable to see ophthalmology due to lack of medical coverage - olopatadine (PATANOL) 0.1 % ophthalmic solution; Place 1 drop into both eyes 2 (two) times daily.  Dispense: 5 mL; Refill: 2  7. Essential hypertension Slightly elevated blood pressures No regimen changed today as she is scheduled for dialysis tomorrow and could be hypotensive.  8. Delirium due to another medical condition Cannot exclude uremia as possible etiology If this persists we will proceed to treat for dementia.   Meds ordered this encounter  Medications  . DULoxetine (CYMBALTA) 60 MG capsule    Sig: Take 1 capsule (60 mg total) by mouth daily.    Dispense:  30 capsule    Refill:  5  . cetirizine (ZYRTEC) 10 MG tablet    Sig: Take 1 tablet (10 mg total) by mouth daily.    Dispense:  30 tablet    Refill:  5  . atorvastatin (LIPITOR) 40 MG tablet    Sig: Take 1 tablet (40 mg total) by mouth daily.    Dispense:  30 tablet    Refill:  5    Discontinue previous dose  . insulin glargine (LANTUS) 100 UNIT/ML injection    Sig: Inject 0.1 mLs (10 Units total) into the skin at bedtime.    Dispense:  10 mL    Refill:  5  . olopatadine (PATANOL) 0.1 % ophthalmic solution    Sig: Place 1 drop into both eyes 2 (two) times daily.    Dispense:  5 mL     Refill:  2    Follow-up: 3 month follow up on diabetes   Arnoldo Morale MD

## 2016-10-12 ENCOUNTER — Other Ambulatory Visit: Payer: Self-pay | Admitting: Pharmacist

## 2016-10-12 MED ORDER — INSULIN DETEMIR 100 UNIT/ML ~~LOC~~ SOLN: 10.0000 [IU] | Freq: Every day | SUBCUTANEOUS | 5 refills | Status: DC

## 2016-10-30 ENCOUNTER — Other Ambulatory Visit (HOSPITAL_COMMUNITY): Payer: Self-pay | Admitting: Nephrology

## 2016-10-30 DIAGNOSIS — Z992 Dependence on renal dialysis: Principal | ICD-10-CM

## 2016-10-30 DIAGNOSIS — N186 End stage renal disease: Secondary | ICD-10-CM

## 2016-10-30 MED FILL — GABAPENTIN 300 MG CAPSULE: 300 | 30 days supply | Qty: 60 | Fill #1

## 2016-11-01 ENCOUNTER — Encounter (HOSPITAL_COMMUNITY): Payer: Self-pay | Admitting: Interventional Radiology

## 2016-11-01 ENCOUNTER — Ambulatory Visit (HOSPITAL_COMMUNITY)
Admission: RE | Admit: 2016-11-01 | Discharge: 2016-11-01 | Disposition: A | Discharge status: Home / Self Care | Payer: Self-pay | Source: Ambulatory Visit | Attending: Nephrology | Admitting: Nephrology

## 2016-11-01 DIAGNOSIS — E1122 Type 2 diabetes mellitus with diabetic chronic kidney disease: Secondary | ICD-10-CM | POA: Insufficient documentation

## 2016-11-01 DIAGNOSIS — I120 Hypertensive chronic kidney disease with stage 5 chronic kidney disease or end stage renal disease: Secondary | ICD-10-CM | POA: Insufficient documentation

## 2016-11-01 DIAGNOSIS — Z992 Dependence on renal dialysis: Secondary | ICD-10-CM

## 2016-11-01 DIAGNOSIS — Z4901 Encounter for fitting and adjustment of extracorporeal dialysis catheter: Secondary | ICD-10-CM | POA: Insufficient documentation

## 2016-11-01 DIAGNOSIS — N186 End stage renal disease: Secondary | ICD-10-CM | POA: Insufficient documentation

## 2016-11-01 HISTORY — PX: IR REMOVAL TUN CV CATH W/O FL: IMG2289

## 2016-11-01 MED ORDER — LIDOCAINE HCL 1 % IJ SOLN
INTRAMUSCULAR | Status: DC | PRN
  Administered 2016-11-01: 20 mL

## 2016-11-01 MED ORDER — LIDOCAINE HCL 1 % IJ SOLN
INTRAMUSCULAR | Status: AC
  Filled 2016-11-01: qty 20

## 2016-11-01 MED ORDER — CHLORHEXIDINE GLUCONATE 4 % EX LIQD
CUTANEOUS | Status: AC
  Filled 2016-11-01: qty 15

## 2016-11-01 MED ORDER — CHLORHEXIDINE GLUCONATE 4 % EX LIQD
CUTANEOUS | Status: DC | PRN
  Administered 2016-11-01: 1 via TOPICAL

## 2016-11-01 NOTE — Procedures (Signed)
ESRD  HD CATH removal No comp Stable Full report in PACS

## 2016-11-07 ENCOUNTER — Other Ambulatory Visit (HOSPITAL_COMMUNITY): Payer: Self-pay | Admitting: Nephrology

## 2016-11-07 DIAGNOSIS — N186 End stage renal disease: Secondary | ICD-10-CM

## 2016-11-10 ENCOUNTER — Other Ambulatory Visit (HOSPITAL_COMMUNITY): Payer: Self-pay | Admitting: Nephrology

## 2016-11-10 ENCOUNTER — Ambulatory Visit (HOSPITAL_COMMUNITY)
Admission: RE | Admit: 2016-11-10 | Discharge: 2016-11-10 | Disposition: A | Discharge status: Home / Self Care | Payer: Self-pay | Source: Ambulatory Visit | Attending: Nephrology | Admitting: Nephrology

## 2016-11-10 ENCOUNTER — Encounter (HOSPITAL_COMMUNITY): Payer: Self-pay | Admitting: Interventional Radiology

## 2016-11-10 DIAGNOSIS — Z992 Dependence on renal dialysis: Secondary | ICD-10-CM | POA: Insufficient documentation

## 2016-11-10 DIAGNOSIS — N186 End stage renal disease: Secondary | ICD-10-CM | POA: Insufficient documentation

## 2016-11-10 DIAGNOSIS — Y832 Surgical operation with anastomosis, bypass or graft as the cause of abnormal reaction of the patient, or of later complication, without mention of misadventure at the time of the procedure: Secondary | ICD-10-CM | POA: Insufficient documentation

## 2016-11-10 DIAGNOSIS — T82858A Stenosis of vascular prosthetic devices, implants and grafts, initial encounter: Secondary | ICD-10-CM | POA: Insufficient documentation

## 2016-11-10 HISTORY — PX: IR AV DIALY SHUNT INTRO NEEDLE/INTRACATH INITIAL W/PTA/IMG LEFT: IMG6103

## 2016-11-10 HISTORY — PX: IR US GUIDE VASC ACCESS LEFT: IMG2389

## 2016-11-10 MED ORDER — LIDOCAINE HCL (PF) 1 % IJ SOLN
INTRAMUSCULAR | Status: AC | PRN
  Administered 2016-11-10: 5 mL

## 2016-11-10 MED ORDER — MIDAZOLAM HCL 2 MG/2ML IJ SOLN
INTRAMUSCULAR | Status: AC
  Filled 2016-11-10: qty 4

## 2016-11-10 MED ORDER — HEPARIN SODIUM (PORCINE) 1000 UNIT/ML IJ SOLN
INTRAMUSCULAR | Status: AC
  Filled 2016-11-10: qty 1

## 2016-11-10 MED ORDER — MIDAZOLAM HCL 2 MG/2ML IJ SOLN
INTRAMUSCULAR | Status: AC | PRN
  Administered 2016-11-10: 1 mg via INTRAVENOUS

## 2016-11-10 MED ORDER — FENTANYL CITRATE (PF) 100 MCG/2ML IJ SOLN
INTRAMUSCULAR | Status: AC | PRN
  Administered 2016-11-10: 50 ug via INTRAVENOUS

## 2016-11-10 MED ORDER — IOPAMIDOL (ISOVUE-300) INJECTION 61%
INTRAVENOUS | Status: AC
  Administered 2016-11-10: 50 mL
  Filled 2016-11-10: qty 100

## 2016-11-10 MED ORDER — LIDOCAINE HCL 1 % IJ SOLN
INTRAMUSCULAR | Status: AC
  Filled 2016-11-10: qty 20

## 2016-11-10 MED ORDER — FENTANYL CITRATE (PF) 100 MCG/2ML IJ SOLN
INTRAMUSCULAR | Status: AC
  Filled 2016-11-10: qty 2

## 2016-11-10 NOTE — Procedures (Deleted)
Interventional Radiology Procedure Note  Procedure: 1.) Fistulogram shows a focal stenosis in the draining vein in the mid upper arm.  Pt declined PTA intervention at this time and would prefer to return to have the procedure done with sedation.  2.) Placement of a right IJ Trialysis catheter to facilitate dialysis today or this weekend. Catheter tip at the cavoatrial junction and ready for use.   Complications: None  Estimated Blood Loss:  None  Recommendations: - Use trialysis for dialysis while let arm hematoma resolves - Reschedule for elective fistulogram with PTA under moderate sedation  Signed,  Criselda Peaches, MD

## 2016-11-10 NOTE — Sedation Documentation (Signed)
Patient is resting comfortably. 

## 2016-11-10 NOTE — Procedures (Signed)
Interventional Radiology Procedure Note  Procedure:  1.) Fistulogram shows two regions of stenosis in the draining cephalic vein, one in the mid upper arm and one at the arch.  2.) PTA to 6 mm with good angiographic result  Complications: None  Estimated Blood Loss: None  Recommendations: - DC home - Remove pursestring suture in 48 hrs  Signed,  Criselda Peaches, MD

## 2016-11-20 MED FILL — DULoxetine HCL 60 MG CPEP: 60 | 30 days supply | Qty: 30 | Fill #1

## 2016-11-29 MED FILL — VALSARTAN 160 MG TABLET: 160 | 30 days supply | Qty: 30 | Fill #3

## 2016-11-29 MED FILL — ?CETIRIZINE HCL 10 MG TABLE: 10 | 30 days supply | Qty: 30 | Fill #1

## 2016-11-29 MED FILL — ?ATORVASTATIN 40 MG TABLET: 40 MG | 30 days supply | Qty: 30 | Fill #3

## 2016-12-02 ENCOUNTER — Encounter (HOSPITAL_COMMUNITY): Payer: Self-pay | Admitting: Emergency Medicine

## 2016-12-02 ENCOUNTER — Emergency Department (HOSPITAL_COMMUNITY): Payer: Medicaid Other

## 2016-12-02 ENCOUNTER — Inpatient Hospital Stay (HOSPITAL_COMMUNITY)
Admission: EM | Admit: 2016-12-02 | Discharge: 2016-12-06 | DRG: 871 | Disposition: A | Discharge status: Home / Self Care | Payer: Medicaid Other | Attending: Internal Medicine | Admitting: Internal Medicine

## 2016-12-02 DIAGNOSIS — E871 Hypo-osmolality and hyponatremia: Secondary | ICD-10-CM | POA: Diagnosis present

## 2016-12-02 DIAGNOSIS — K0889 Other specified disorders of teeth and supporting structures: Secondary | ICD-10-CM

## 2016-12-02 DIAGNOSIS — F0390 Unspecified dementia without behavioral disturbance: Secondary | ICD-10-CM | POA: Diagnosis present

## 2016-12-02 DIAGNOSIS — E873 Alkalosis: Secondary | ICD-10-CM | POA: Diagnosis present

## 2016-12-02 DIAGNOSIS — E785 Hyperlipidemia, unspecified: Secondary | ICD-10-CM | POA: Diagnosis present

## 2016-12-02 DIAGNOSIS — I120 Hypertensive chronic kidney disease with stage 5 chronic kidney disease or end stage renal disease: Secondary | ICD-10-CM | POA: Diagnosis present

## 2016-12-02 DIAGNOSIS — E1110 Type 2 diabetes mellitus with ketoacidosis without coma: Secondary | ICD-10-CM | POA: Diagnosis present

## 2016-12-02 DIAGNOSIS — F329 Major depressive disorder, single episode, unspecified: Secondary | ICD-10-CM | POA: Diagnosis present

## 2016-12-02 DIAGNOSIS — R6520 Severe sepsis without septic shock: Secondary | ICD-10-CM | POA: Diagnosis present

## 2016-12-02 DIAGNOSIS — R1312 Dysphagia, oropharyngeal phase: Secondary | ICD-10-CM | POA: Diagnosis present

## 2016-12-02 DIAGNOSIS — Z79899 Other long term (current) drug therapy: Secondary | ICD-10-CM

## 2016-12-02 DIAGNOSIS — Z89422 Acquired absence of other left toe(s): Secondary | ICD-10-CM

## 2016-12-02 DIAGNOSIS — N39498 Other specified urinary incontinence: Secondary | ICD-10-CM

## 2016-12-02 DIAGNOSIS — E1140 Type 2 diabetes mellitus with diabetic neuropathy, unspecified: Secondary | ICD-10-CM | POA: Diagnosis present

## 2016-12-02 DIAGNOSIS — N2581 Secondary hyperparathyroidism of renal origin: Secondary | ICD-10-CM | POA: Diagnosis present

## 2016-12-02 DIAGNOSIS — Z992 Dependence on renal dialysis: Secondary | ICD-10-CM

## 2016-12-02 DIAGNOSIS — R32 Unspecified urinary incontinence: Secondary | ICD-10-CM | POA: Diagnosis present

## 2016-12-02 DIAGNOSIS — N390 Urinary tract infection, site not specified: Secondary | ICD-10-CM | POA: Diagnosis present

## 2016-12-02 DIAGNOSIS — R404 Transient alteration of awareness: Secondary | ICD-10-CM

## 2016-12-02 DIAGNOSIS — N186 End stage renal disease: Secondary | ICD-10-CM | POA: Diagnosis present

## 2016-12-02 DIAGNOSIS — F419 Anxiety disorder, unspecified: Secondary | ICD-10-CM | POA: Diagnosis present

## 2016-12-02 DIAGNOSIS — R739 Hyperglycemia, unspecified: Secondary | ICD-10-CM

## 2016-12-02 DIAGNOSIS — R4182 Altered mental status, unspecified: Secondary | ICD-10-CM

## 2016-12-02 DIAGNOSIS — Z794 Long term (current) use of insulin: Secondary | ICD-10-CM

## 2016-12-02 DIAGNOSIS — R402212 Coma scale, best verbal response, none, at arrival to emergency department: Secondary | ICD-10-CM | POA: Diagnosis present

## 2016-12-02 DIAGNOSIS — R402112 Coma scale, eyes open, never, at arrival to emergency department: Secondary | ICD-10-CM | POA: Diagnosis present

## 2016-12-02 DIAGNOSIS — Z8701 Personal history of pneumonia (recurrent): Secondary | ICD-10-CM

## 2016-12-02 DIAGNOSIS — R402352 Coma scale, best motor response, localizes pain, at arrival to emergency department: Secondary | ICD-10-CM | POA: Diagnosis present

## 2016-12-02 DIAGNOSIS — G9341 Metabolic encephalopathy: Secondary | ICD-10-CM | POA: Diagnosis present

## 2016-12-02 DIAGNOSIS — E860 Dehydration: Secondary | ICD-10-CM

## 2016-12-02 DIAGNOSIS — R1310 Dysphagia, unspecified: Secondary | ICD-10-CM | POA: Diagnosis present

## 2016-12-02 DIAGNOSIS — E1122 Type 2 diabetes mellitus with diabetic chronic kidney disease: Secondary | ICD-10-CM | POA: Diagnosis present

## 2016-12-02 DIAGNOSIS — R0982 Postnasal drip: Secondary | ICD-10-CM

## 2016-12-02 DIAGNOSIS — D631 Anemia in chronic kidney disease: Secondary | ICD-10-CM | POA: Diagnosis present

## 2016-12-02 DIAGNOSIS — A419 Sepsis, unspecified organism: Secondary | ICD-10-CM | POA: Diagnosis present

## 2016-12-02 DIAGNOSIS — Z7189 Other specified counseling: Secondary | ICD-10-CM

## 2016-12-02 DIAGNOSIS — Z8619 Personal history of other infectious and parasitic diseases: Secondary | ICD-10-CM

## 2016-12-02 LAB — COMPREHENSIVE METABOLIC PANEL
ALBUMIN: 2.1 g/dL — AB (ref 3.5–5.0)
ALK PHOS: 154 U/L — AB (ref 38–126)
ALT: 13 U/L — ABNORMAL LOW (ref 14–54)
AST: 19 U/L (ref 15–41)
Anion gap: 14 (ref 5–15)
BILIRUBIN TOTAL: 0.8 mg/dL (ref 0.3–1.2)
BUN: 60 mg/dL — AB (ref 6–20)
CALCIUM: 8.5 mg/dL — AB (ref 8.9–10.3)
CO2: 19 mmol/L — ABNORMAL LOW (ref 22–32)
Chloride: 92 mmol/L — ABNORMAL LOW (ref 101–111)
Creatinine, Ser: 6.68 mg/dL — ABNORMAL HIGH (ref 0.44–1.00)
GFR calc Af Amer: 7 mL/min — ABNORMAL LOW (ref >60)
GFR calc non Af Amer: 6 mL/min — ABNORMAL LOW (ref >60)
GLUCOSE: 890 mg/dL — AB (ref 65–99)
Potassium: 5.2 mmol/L — ABNORMAL HIGH (ref 3.5–5.1)
Sodium: 125 mmol/L — ABNORMAL LOW (ref 135–145)
TOTAL PROTEIN: 6.5 g/dL (ref 6.5–8.1)

## 2016-12-02 LAB — URINALYSIS, COMPLETE (UACMP) WITH MICROSCOPIC
Bilirubin Urine: NEGATIVE
Glucose, UA: >=500 mg/dL — AB
Ketones, ur: 5 mg/dL — AB
Nitrite: NEGATIVE
Protein, ur: 100 mg/dL — AB
Specific Gravity, Urine: 1.017 (ref 1.005–1.030)
Squamous Epithelial / LPF: NONE SEEN
pH: 5 (ref 5.0–8.0)

## 2016-12-02 LAB — CBC WITH DIFFERENTIAL/PLATELET
Basophils Absolute: 0 10*3/uL (ref 0.0–0.1)
Basophils Relative: 0 %
Eosinophils Absolute: 0 10*3/uL (ref 0.0–0.7)
Eosinophils Relative: 0 %
HCT: 39.1 % (ref 36.0–46.0)
Hemoglobin: 12.4 g/dL (ref 12.0–15.0)
Lymphocytes Relative: 3 %
Lymphs Abs: 0.7 10*3/uL (ref 0.7–4.0)
MCH: 29.2 pg (ref 26.0–34.0)
MCHC: 31.7 g/dL (ref 30.0–36.0)
MCV: 92.2 fL (ref 78.0–100.0)
Monocytes Absolute: 1.3 10*3/uL — ABNORMAL HIGH (ref 0.1–1.0)
Monocytes Relative: 6 %
Neutro Abs: 21.7 10*3/uL — ABNORMAL HIGH (ref 1.7–7.7)
Neutrophils Relative %: 91 %
Platelets: 274 10*3/uL (ref 150–400)
RBC: 4.24 MIL/uL (ref 3.87–5.11)
RDW: 17.5 % — ABNORMAL HIGH (ref 11.5–15.5)
WBC: 23.8 10*3/uL — ABNORMAL HIGH (ref 4.0–10.5)

## 2016-12-02 LAB — CBG MONITORING, ED
Glucose-Capillary: >600 mg/dL (ref 65–99)
Glucose-Capillary: >600 mg/dL (ref 65–99)
Glucose-Capillary: >600 mg/dL (ref 65–99)

## 2016-12-02 LAB — PHOSPHORUS: Phosphorus: 2 mg/dL — ABNORMAL LOW (ref 2.5–4.6)

## 2016-12-02 LAB — MRSA PCR SCREENING: MRSA BY PCR: NEGATIVE

## 2016-12-02 LAB — BASIC METABOLIC PANEL
Anion gap: 15 (ref 5–15)
BUN: 24 mg/dL — AB (ref 6–20)
CALCIUM: 8.6 mg/dL — AB (ref 8.9–10.3)
CO2: 25 mmol/L (ref 22–32)
CREATININE: 3.85 mg/dL — AB (ref 0.44–1.00)
Chloride: 95 mmol/L — ABNORMAL LOW (ref 101–111)
GFR calc Af Amer: 13 mL/min — ABNORMAL LOW (ref >60)
GFR, EST NON AFRICAN AMERICAN: 11 mL/min — AB (ref >60)
Glucose, Bld: 240 mg/dL — ABNORMAL HIGH (ref 65–99)
Potassium: 3.8 mmol/L (ref 3.5–5.1)
SODIUM: 135 mmol/L (ref 135–145)

## 2016-12-02 LAB — GLUCOSE, CAPILLARY
Glucose-Capillary: 157 mg/dL — ABNORMAL HIGH (ref 65–99)
Glucose-Capillary: 236 mg/dL — ABNORMAL HIGH (ref 65–99)
Glucose-Capillary: 274 mg/dL — ABNORMAL HIGH (ref 65–99)

## 2016-12-02 LAB — I-STAT CG4 LACTIC ACID, ED: Lactic Acid, Venous: 2.78 mmol/L (ref 0.5–1.9)

## 2016-12-02 LAB — I-STAT VENOUS BLOOD GAS, ED
Acid-Base Excess: 2 mmol/L (ref 0.0–2.0)
Bicarbonate: 26.1 mmol/L (ref 20.0–28.0)
O2 Saturation: 98 %
TCO2: 27 mmol/L (ref 0–100)
pCO2, Ven: 39.1 mmHg — ABNORMAL LOW (ref 44.0–60.0)
pH, Ven: 7.432 — ABNORMAL HIGH (ref 7.250–7.430)
pO2, Ven: 104 mmHg — ABNORMAL HIGH (ref 32.0–45.0)

## 2016-12-02 LAB — LACTIC ACID, PLASMA: LACTIC ACID, VENOUS: 4.3 mmol/L — AB (ref 0.5–1.9)

## 2016-12-02 MED ORDER — PIPERACILLIN-TAZOBACTAM 3.375 G IVPB 30 MIN
3.3750 g | Freq: Once | INTRAVENOUS | Status: AC
  Administered 2016-12-02: 3.375 g via INTRAVENOUS
  Filled 2016-12-02: qty 50

## 2016-12-02 MED ORDER — VANCOMYCIN HCL IN DEXTROSE 1-5 GM/200ML-% IV SOLN
1000.0000 mg | Freq: Once | INTRAVENOUS | Status: AC
  Administered 2016-12-02: 1000 mg via INTRAVENOUS
  Filled 2016-12-02: qty 200

## 2016-12-02 MED ORDER — VANCOMYCIN HCL IN DEXTROSE 750-5 MG/150ML-% IV SOLN
750.0000 mg | Freq: Once | INTRAVENOUS | Status: DC
  Filled 2016-12-02: qty 150

## 2016-12-02 MED ORDER — SODIUM CHLORIDE 0.9 % IV SOLN
INTRAVENOUS | Status: DC
  Administered 2016-12-02: 5.4 [IU]/h via INTRAVENOUS
  Filled 2016-12-02: qty 1

## 2016-12-02 MED ORDER — INSULIN DETEMIR 100 UNIT/ML ~~LOC~~ SOLN
10.0000 [IU] | Freq: Every day | SUBCUTANEOUS | Status: DC
  Administered 2016-12-03 (×2): 10 [IU] via SUBCUTANEOUS
  Filled 2016-12-02 (×2): qty 0.1

## 2016-12-02 MED ORDER — VANCOMYCIN HCL 500 MG IV SOLR
500.0000 mg | Freq: Once | INTRAVENOUS | Status: AC
  Administered 2016-12-02: 500 mg via INTRAVENOUS
  Filled 2016-12-02: qty 500

## 2016-12-02 MED ORDER — ACETAMINOPHEN 650 MG RE SUPP: 650.0000 mg | Freq: Four times a day (QID) | RECTAL | Status: DC | PRN

## 2016-12-02 MED ORDER — SODIUM CHLORIDE 0.9 % IV BOLUS (SEPSIS)
500.0000 mL | Freq: Once | INTRAVENOUS | Status: AC
Start: 2016-12-02 — End: 2016-12-02
  Administered 2016-12-02: 500 mL via INTRAVENOUS

## 2016-12-02 MED ORDER — OLOPATADINE HCL 0.1 % OP SOLN
1.0000 [drp] | Freq: Two times a day (BID) | OPHTHALMIC | Status: DC
  Administered 2016-12-03 – 2016-12-06 (×7): 1 [drp] via OPHTHALMIC
  Filled 2016-12-02 (×3): qty 5

## 2016-12-02 MED ORDER — DEXTROSE-NACL 5-0.45 % IV SOLN: INTRAVENOUS | Status: DC

## 2016-12-02 MED ORDER — LIDOCAINE HCL (PF) 1 % IJ SOLN: 5.0000 mL | INTRAMUSCULAR | Status: DC | PRN

## 2016-12-02 MED ORDER — PIPERACILLIN-TAZOBACTAM 3.375 G IVPB
3.3750 g | Freq: Two times a day (BID) | INTRAVENOUS | Status: DC
  Administered 2016-12-02 – 2016-12-04 (×4): 3.375 g via INTRAVENOUS
  Filled 2016-12-02 (×5): qty 50

## 2016-12-02 MED ORDER — SODIUM CHLORIDE 0.9 % IV SOLN: 100.0000 mL | INTRAVENOUS | Status: DC | PRN

## 2016-12-02 MED ORDER — ALTEPLASE 2 MG IJ SOLR: 2.0000 mg | Freq: Once | INTRAMUSCULAR | Status: DC | PRN

## 2016-12-02 MED ORDER — SODIUM CHLORIDE 0.9 % IV BOLUS (SEPSIS): 3000.0000 mL | Freq: Once | INTRAVENOUS | Status: DC

## 2016-12-02 MED ORDER — PENTAFLUOROPROP-TETRAFLUOROETH EX AERO: 1.0000 "application " | INHALATION_SPRAY | CUTANEOUS | Status: DC | PRN

## 2016-12-02 MED ORDER — ACETAMINOPHEN 650 MG RE SUPP
650.0000 mg | Freq: Once | RECTAL | Status: AC
  Administered 2016-12-02: 650 mg via RECTAL
  Filled 2016-12-02: qty 1

## 2016-12-02 MED ORDER — HEPARIN SODIUM (PORCINE) 1000 UNIT/ML DIALYSIS
1000.0000 [IU] | INTRAMUSCULAR | Status: DC | PRN
  Filled 2016-12-02: qty 1

## 2016-12-02 MED ORDER — HEPARIN SODIUM (PORCINE) 5000 UNIT/ML IJ SOLN
5000.0000 [IU] | Freq: Three times a day (TID) | INTRAMUSCULAR | Status: DC
  Administered 2016-12-02 – 2016-12-06 (×11): 5000 [IU] via SUBCUTANEOUS
  Filled 2016-12-02 (×10): qty 1

## 2016-12-02 MED ORDER — LIDOCAINE-PRILOCAINE 2.5-2.5 % EX CREA
1.0000 "application " | TOPICAL_CREAM | CUTANEOUS | Status: DC | PRN
  Filled 2016-12-02: qty 5

## 2016-12-02 MED ORDER — SODIUM CHLORIDE 0.9 % IV SOLN: INTRAVENOUS | Status: DC

## 2016-12-02 MED ORDER — SODIUM CHLORIDE 0.9 % IV BOLUS (SEPSIS)
1000.0000 mL | Freq: Once | INTRAVENOUS | Status: AC
  Administered 2016-12-02: 1000 mL via INTRAVENOUS

## 2016-12-02 MED ORDER — SODIUM CHLORIDE 0.9 % IV SOLN
INTRAVENOUS | Status: DC
  Administered 2016-12-02: 7.1 [IU]/h via INTRAVENOUS

## 2016-12-02 MED ORDER — INSULIN ASPART 100 UNIT/ML ~~LOC~~ SOLN
0.0000 [IU] | SUBCUTANEOUS | Status: DC
  Administered 2016-12-03: 2 [IU] via SUBCUTANEOUS
  Administered 2016-12-03: 1 [IU] via SUBCUTANEOUS
  Administered 2016-12-03: 2 [IU] via SUBCUTANEOUS

## 2016-12-02 MED ORDER — HEPARIN SODIUM (PORCINE) 1000 UNIT/ML DIALYSIS
40.0000 [IU]/kg | INTRAMUSCULAR | Status: DC | PRN
  Administered 2016-12-02: 2800 [IU] via INTRAVENOUS_CENTRAL

## 2016-12-02 NOTE — Progress Notes (Signed)
HD tx initiated via 15G x2 w/o problem, pull/push/flush w/o problem, will cont to monitor while on HD tx

## 2016-12-02 NOTE — ED Notes (Signed)
To hemodialysis 

## 2016-12-02 NOTE — Progress Notes (Signed)
Pt to dialyze this afternoon.  Report attempted from [place], ED RN.  Christine to return call to HDU when able.

## 2016-12-02 NOTE — ED Notes (Signed)
The pt opens her eyes  When touched and she made eye contact with me family at  The bedside are talking to her in [place]  But she is not  Responding  Disoriented.   She does not understand english.  Hx of intermittent disorientation in the past few months.

## 2016-12-02 NOTE — ED Triage Notes (Signed)
Received pt from home via EMS with c/o per family pt mental status changed onset yesterday. Today pt was completely unresponsive for family. Pt responsive to pain and movement. Pt goes to dialysis T,TH,SA, pt was not able to make it to dialysis today. Per family to EMS, pt has strong odor in her urine. Pt normal mental status is alert with some verbal abilities. Pt has not had insulin for 2 days.

## 2016-12-02 NOTE — ED Notes (Signed)
IV antibiotics started by Benay Pillow

## 2016-12-02 NOTE — Consult Note (Signed)
[facility] Renal Consultation Note    Indication for Consultation:  Management of ESRD/hemodialysis; anemia, hypertension/volume and secondary hyperparathyroidism PCP: Arnoldo Morale Nephrologist: Dr. Marval Regal  HPI: Sarah Kelley is a 66 y.o. female with ESRD 2/2 to HTN who started hemodialysis in January, 2018. She has hemodialysis T, TH, S at [facility]. PMH significant for DM, HTN, HLD, PNA, UTI, dementia. She is non-English speaking and currently unresponsive. Information gathered from family member.   Per family, patient has been complaining of aching all over, particularly in legs past 24 hours. Apparently she became more confused during night. This AM, son went to awaken her for her hemodialysis treatment and found her to be unresponsive. She was brought to [facility] for evaluation. Upon arrival, Temperature was 101.6 BP 112/44 HR 61 RR 21 O2 Sat 94% on RA. WBC 23.8 HGB 12.4 PLT 274 Neut 21.7 monocyte 1.3 Lactic acid 2.78.BS >600. AGAS-7.42 PCO2 39.1.  PO2 104. Urine was reported by staff to "look like milk". Per UA mod leuks, rare bacteria, nitrite negative. BS >800 but CMP is clotted. Staff is attempting to redraw.  Blood cultures drawn, she has rec'd  Vanc1500 mg , Zosyn 3.375 G IV. She has been started on glucostablizer with insulin drip.  She remains unresponsive to verbal/tactile stimuli-asked family members to attempt to help me rouse her without success.   Patient had RIJ TDC which was removed 11/01/2016. She has not missed any hemodialysis treatments. She has been using LUA AVF without issues. Last HGB 11.8 11/30/16, Ca 9.2 C Ca 9.9 Phos 3.2 PTH 163 11/23/16.   Past Medical History:  Diagnosis Date  . Diabetes mellitus   . Hyperlipidemia   . Hypertension   . Neuropathy    Diabetic  . Pneumonia 07/2016  . Renal disorder   . Thyroid disease    Past Surgical History:  Procedure Laterality Date  . AMPUTATION Left 05/21/2015   Procedure:  Left Foot 3rd Ray Amputation;  Surgeon: Newt Minion, MD;  Location: [place];  Service: Orthopedics;  Laterality: Left;  . AV FISTULA PLACEMENT Left 03/28/2016   Procedure: ARTERIOVENOUS (AV) FISTULA CREATION LEFT ARM;  Surgeon: Waynetta Sandy, MD;  Location: [place];  Service: Vascular;  Laterality: Left;  . DILATION AND CURETTAGE OF UTERUS    . FISTULA SUPERFICIALIZATION Left 09/04/2016   Procedure: FISTULA SUPERFICIALIZATION LEFT UPPER ARM;  Surgeon: Waynetta Sandy, MD;  Location: [place];  Service: Vascular;  Laterality: Left;  . IR AV DIALY SHUNT INTRO NEEDLE/INTRACATH INITIAL W/PTA/IMG LEFT  11/10/2016  . IR GENERIC HISTORICAL  08/01/2016   IR FLUORO GUIDE CV LINE RIGHT 08/01/2016 [name], MD [facility]  . IR GENERIC HISTORICAL  08/01/2016   IR US GUIDE VASC ACCESS RIGHT 08/01/2016 [name], MD [facility]  . IR REMOVAL TUN CV CATH W/O FL  11/01/2016  . IR US GUIDE VASC ACCESS LEFT  11/10/2016   No family history on file. Social History:  reports that she has never smoked. She has never used smokeless tobacco. She reports that she does not drink alcohol or use drugs. Allergies  Allergen Reactions  . No Known Allergies    Prior to Admission medications   Medication Sig Start Date End Date Taking? Authorizing Provider  atorvastatin (LIPITOR) 40 MG tablet Take 1 tablet (40 mg total) by mouth daily. 10/11/16  Yes Arnoldo Morale, MD  cetirizine (ZYRTEC) 10 MG tablet Take 1 tablet (10 mg total) by mouth daily. 10/11/16  Yes Arnoldo Morale, MD  DULoxetine (CYMBALTA) 60 MG capsule Take 1 capsule (60 mg total) by mouth daily. 10/11/16  Yes Arnoldo Morale, MD  gabapentin (NEURONTIN) 300 MG capsule Take 600 mg by mouth daily. 08/21/16  Yes [provider]  olopatadine (PATANOL) 0.1 % ophthalmic solution Place 1 drop into both eyes 2 (two) times daily. 10/11/16  Yes Arnoldo Morale, MD  calcium acetate (PHOSLO) 667 MG capsule Take 2 capsules (1,334 mg total) by mouth 3 (three)  times daily with meals. Patient not taking: Reported on 10/06/2016 08/04/16   Rai, Vernelle Emerald, MD  insulin detemir (LEVEMIR) 100 UNIT/ML injection Inject 0.1 mLs (10 Units total) into the skin at bedtime. 10/12/16   Arnoldo Morale, MD  valsartan (DIOVAN) 160 MG tablet Take 160 mg by mouth daily. 08/16/16   [provider]   Current Facility-Administered Medications  Medication Dose Route Frequency Provider Last Rate Last Dose  . dextrose 5 %-0.45 % sodium chloride infusion   Intravenous Continuous Lawyer, Christopher, PA-C      . insulin regular (NOVOLIN R,HUMULIN R) 100 Units in sodium chloride 0.9 % 100 mL (1 Units/mL) infusion   Intravenous Continuous Lawyer, Christopher, PA-C 5.4 mL/hr at 12/02/16 1405 5.4 Units/hr at 12/02/16 1405  . piperacillin-tazobactam (ZOSYN) IVPB 3.375 g  3.375 g Intravenous Q12H Ricka Burdock, RPH      . vancomycin (VANCOCIN) 500 mg in sodium chloride 0.9 % 100 mL IVPB  500 mg Intravenous Once Ricka Burdock, [facility]       Current Outpatient Prescriptions  Medication Sig Dispense Refill  . atorvastatin (LIPITOR) 40 MG tablet Take 1 tablet (40 mg total) by mouth daily. 30 tablet 5  . cetirizine (ZYRTEC) 10 MG tablet Take 1 tablet (10 mg total) by mouth daily. 30 tablet 5  . DULoxetine (CYMBALTA) 60 MG capsule Take 1 capsule (60 mg total) by mouth daily. 30 capsule 5  . gabapentin (NEURONTIN) 300 MG capsule Take 600 mg by mouth daily.  3  . olopatadine (PATANOL) 0.1 % ophthalmic solution Place 1 drop into both eyes 2 (two) times daily. 5 mL 2  . calcium acetate (PHOSLO) 667 MG capsule Take 2 capsules (1,334 mg total) by mouth 3 (three) times daily with meals. (Patient not taking: Reported on 10/06/2016) 180 capsule 3  . insulin detemir (LEVEMIR) 100 UNIT/ML injection Inject 0.1 mLs (10 Units total) into the skin at bedtime. 10 mL 5  . valsartan (DIOVAN) 160 MG tablet Take 160 mg by mouth daily.  3   Labs: Basic Metabolic Panel: No results for input(s): NA, K, CL,  CO2, GLUCOSE, BUN, CREATININE, CALCIUM, PHOS in the last 168 hours.  Invalid input(s): ALB Liver Function Tests: No results for input(s): AST, ALT, ALKPHOS, BILITOT, PROT, ALBUMIN in the last 168 hours. No results for input(s): LIPASE, AMYLASE in the last 168 hours. No results for input(s): AMMONIA in the last 168 hours. CBC:  Recent Labs Lab 12/02/16 1237  WBC 23.8*  NEUTROABS 21.7*  HGB 12.4  HCT 39.1  MCV 92.2  PLT 274   CBG:  Recent Labs Lab 12/02/16 1230  GLUCAP >600*   Studies/Results: Dg Chest Port 1 View  Result Date: 12/02/2016 CLINICAL DATA:  Fever, unresponsive EXAM: PORTABLE CHEST 1 VIEW COMPARISON:  07/28/2016; 12/31/2008 FINDINGS: Grossly unchanged enlarged cardiac silhouette and mediastinal contours with atherosclerotic plaque when the thoracic aorta. Overall improved aeration of lungs with persistent bibasilar opacities, likely atelectasis. Trace pleural effusions are not excluded. No definite evidence of  edema. No pneumothorax. No acute osseus abnormalities. IMPRESSION: Overall improved aeration of lungs with persistent trace effusions and bibasilar opacities, likely atelectasis. Further evaluation with a PA and lateral chest radiograph may be obtained as clinically indicated. Electronically Signed   By: [name] M.D.   On: 12/02/2016 13:02    ROS: As per HPI otherwise negative.   Physical Exam: Vitals:   12/02/16 1400 12/02/16 1415 12/02/16 1430 12/02/16 1432  BP: (!) 136/59 (!) 111/46 (!) 94/42   Pulse: 83 81 77   Resp: 18 17 16    Temp:      TempSrc:      SpO2: 96% 96% 96%   Weight:    69 kg (152 lb 1.9 oz)  Height:    5\' 2"  (1.575 m)     General: chronically ill appearing female, currently unresponsive to verbal/tactile stimuli.  Head: Normocephalic, atraumatic, sclera non-icteric, mucus membranes are dry Neck: Supple. JVD not elevated. Lungs: Clear bilaterally to auscultation without wheezes, rales, or rhonchi. Breathing is unlabored, breath  sounds decreased in bases.  Heart: RRR with S1 S2. No murmurs, rubs, or gallops appreciated. Abdomen: Soft, non-tender, non-distended with normoactive bowel sounds. No rebound/guarding. No obvious abdominal masses. M-S:  Unable to assess.  Lower extremities:without edema or ischemic changes, no open wounds  Neuro: Unable to assess Psych:  Unable to assess Dialysis Access:LUA AVF + bruit  Dialysis Orders: [place] T, Th, S 4 hrs 180 NRe 400/800 58.5 kg 3.0K/2.25 Ca -Heparin 2000 units IV TIW -Hectoral 1 mcg IV TIW  Assessment/Plan: 1. Sepsis/[place] UTI: ATX per primary, cultures pending.  2. Hyperglycemia: Attempting to redraw CMP. BS >600 per glucometer. Has been started on insulin drip per protocol.  3.  ESRD -  T,Th,S missed HD today. Has been using LUA AVF-only up to 17 g needles. May need repeat fistulogram/PTA to cephalic vein per Dr. Laurence Ferrari mid upper and one in arch of cephalic vein. Has  No K+ results yet. 4.  Hypertension/volume  - No antihypertensive meds on OP med list. Has been leaving HD within reasonable range of EDW-however BP has been high at end of treatment. When stabilized, may need to lower EDW. Holding off HD until labs available. Last BP 94/42.  5.  Anemia  - HGB 12.4. No OP ESA or Venofer. Follow HGB.  6.  Metabolic bone disease -  No binders/cinacalcet. Cont VDRA. 7.  Nutrition -NPO at present 8. DM: as noted above. Per primary.   Belenda Alviar H. Owens Shark, NP-C 12/02/2016, 2:39 PM  [employer] [phone]

## 2016-12-02 NOTE — Progress Notes (Signed)
HD attempted report for second time from ED RN, Altha Harm.  RN with other patient and unable to given report at this time.  HDU awaiting call back.

## 2016-12-02 NOTE — ED Notes (Signed)
Glucose over 600 still insulin per glucose stabilizer  Rate of insulin remains at 5.4.  Family at the bedside

## 2016-12-02 NOTE — Progress Notes (Addendum)
Pharmacy Antibiotic Note  Sarah Kelley is a 66 y.o. female admitted on 12/02/2016 with sepsis.  Pharmacy has been consulted for vancomycin and zosyn dosing. Patient is ESRD on HD (TTS), last HD per family report was 5/31. Patient received vancomycin 1g x1 in the ED, will give an additional 500 mg to complete total loading dose of 1500 mg. Per patient's family they do not believe she has received any antimicrobials at her recent dialysis sessions.  Plan: - Vancomycin 1g x1 in ED, give an additional 500 mg to complete loading dose - Will follow up renal plans for further doses - Zosyn 3.375g IV q12h - Monitor renal function, C&S and clinical progression  - Check vancomycin trough prn  12/02/2016, 7:56 PM - Patient will get HD tonight, will give vancomycin 750 mg IV post HD  - Follow up HD schedule  Temp (24hrs), Avg:101.6 F (38.7 C), Min:101.6 F (38.7 C), Max:101.6 F (38.7 C)   Recent Labs Lab 12/02/16 1237 12/02/16 1252  WBC 23.8*  --   LATICACIDVEN  --  2.78*    Weight: 57 kg   CrCl cannot be calculated (Patient's most recent lab result is older than the maximum 21 days allowed.).    Allergies  Allergen Reactions  . No Known Allergies     Antimicrobials this admission: Vancomycin 6/2 >>  Zosyn 6/2 >>   Microbiology results: 6/2 BCx: 6/2 UCx:  Thank you for allowing pharmacy to be a part of this patient's care.  Dimitri Ped, PharmD, BCPS PGY-2 Infectious Diseases Pharmacy Resident Pager: [phone] 12/02/2016 1:14 PM

## 2016-12-02 NOTE — H&P (Signed)
Date: 12/02/2016               Patient Name:  Sarah Kelley MRN: [id number]  DOB: Aug 18, 1950 Age / Sex: 66 y.o., female   PCP: Arnoldo Morale, MD         Medical Service: Internal Medicine Teaching Service         Attending Physician: Dr. Sid Falcon, MD    First Contact: Dr. Hetty Ely  Pager: [phone]  Second Contact: Dr. Juleen China  Pager: [phone]       After Hours (After 5p/  First Contact Pager: [phone]  weekends / holidays): Second Contact Pager: [phone]   Chief Complaint: decreased responsiveness   History of Present Illness:  66 yo woman with PMH ESRD who started T/R/Sa dialysis 07/2016, depression, hypertension, and insulin dependent diabetes mellitus who presents to the ED with altered mental status. The majority of this history is taken from her son and grandson as the patient is spanish speaking and disoriented. Her family reports she was less responsive and her skin felt warm last night. This morning she became  less responsive - she is not speaking but is moving all extremities. Prior to today she described body ache for days. She was also having decreased appetite for the past few days She did not complain of difficulty breathing, sick contacts, nausea, vomiting, abdominal pain, or skin breakdown. She has urinary incontinence and wears breifs. Her son has noticed that she has had foul smelling urine. Her niece reports that she was admitted for sepsis and pneumonia in January and has had difficulty taking care of herself since then, chart review of notes at that time mention that she was having intermittent hallucination and confusion, this admission is when she began dialysis. Her family does not report any missed dialysis sessions.   Upon arival to the ED she had temp 101.6, HR 90s, BP 112/44, and was sating at 100% on room air. Labs revealed K 5.2, Na 125, WBC 23.8 with 22% neutrophils, Hgb 12.4, glucose 890, bicarb 19, anion gap 14, pH 7.47 (7.43 on VBG) pCO2 31 (39  on VBG), lactic acid 2.78, and BUN 60. Urinalysis revealed rare bacteria, moderate leukocytes, no squamous epithelium, glucose >500, 5 ketones, and RBCs. Blood and urine cultures were drawn, code sepsis was called.   Meds:  Current Meds  Medication Sig  . acetaminophen (TYLENOL) 325 MG tablet Take 325-650 mg by mouth every 6 (six) hours as needed for mild pain or fever.  Marland Kitchen atorvastatin (LIPITOR) 40 MG tablet Take 1 tablet (40 mg total) by mouth daily.  . cetirizine (ZYRTEC) 10 MG tablet Take 1 tablet (10 mg total) by mouth daily.  . DULoxetine (CYMBALTA) 60 MG capsule Take 1 capsule (60 mg total) by mouth daily.  Marland Kitchen gabapentin (NEURONTIN) 300 MG capsule Take 600 mg by mouth at bedtime.   Marland Kitchen LANTUS 100 UNIT/ML injection Inject 8-10 Units into the skin 2 (two) times daily. MORNING and NOON(TIME)  . valsartan (DIOVAN) 160 MG tablet Take 160 mg by mouth daily.     Allergies: Allergies as of 12/02/2016 - Review Complete 12/02/2016  Allergen Reaction Noted  . No known allergies  09/01/2016   Past Medical History:  Diagnosis Date  . Diabetes mellitus   . Hyperlipidemia   . Hypertension   . Neuropathy    Diabetic  . Pneumonia 07/2016  . Renal disorder   . Thyroid disease    Family History: No pertinent family hx reported.  Social History:  Social History   Social History  . Marital status: Married    Spouse name: N/A  . Number of children: N/A  . Years of education: N/A   Occupational History  . Not on file.   Social History Main Topics  . Smoking status: Never Smoker  . Smokeless tobacco: Never Used  . Alcohol use No     Comment: rare- a beer every now and then  . Drug use: No  . Sexual activity: Not Currently   Other Topics Concern  . Not on file   Social History Narrative  . No narrative on file   Review of Systems: A complete ROS was negative except as per HPI.   Physical Exam: Blood pressure (!) 96/43, pulse 77, temperature 97.8 F (36.6 C), temperature  source Oral, resp. rate 18, height 5' 2"  (1.575 m), weight 152 lb 1.9 oz (69 kg), SpO2 98 %. Physical Exam  Constitutional:  Patient is ill appearing lying in the bed with her knees flexed. Minimal verbal response only to her son. She does blink her eyes and shake her head. She has difficulty understanding and following commands.   HENT:  Head: Normocephalic and atraumatic.  Mouth/Throat: No oropharyngeal exudate.  Poor dentition   Cardiovascular: Normal rate and regular rhythm.   No murmur heard. Pulmonary/Chest: Effort normal. No respiratory distress. She has no wheezes. She has no rales.  Abdominal: Soft. Bowel sounds are normal. She exhibits no distension. There is no guarding.  Tenderness to palpation difficult to assess   Neurological:  Oriented to person but not time or place. Patient is not able to follow commands on cranial nerve exam. She moves her hands during grip strength testing. Negative babinski.   Skin: Skin is warm and dry.   Labs:  Na 125, K 5.2, Cl 92, bicarb 19, BUN 60, crt 6.68, gap 14, glucose 890 Alk phos 154, AST 19, ALT 13 WBC 23.8 with 22% neutrophils Hgb 12.4, and plt 274 Urine- rare bacteria, >500 glucose, moderate hgb, 5 ketones, moderate leukocytes, negative nitrites, too numerous to count RBCs,  Lactic acid 2.78  EKG: sinus tachycardia, left axis deviation, left anterior fascicular block  CXR: Bibasilar opacities  Assessment & Plan by Problem: Principal Problem:   Sepsis ([place]) Active Problems:   ESRD (end stage renal disease) ([place])   Hyperglycemia  Sepsis  Patient presents with progressively decreasing oral intake and responsiveness over the past week. She had not told her family about any symptoms suggestive of infection. On presentation she is febrile with leukocytosis, elevated lactic acid, and urinalysis suggestive of infection. S/p 1.5 L NS bolus. The cause of her sepsis is not yet clear but we will follow with cultures and further imaging  tomorrow.  -follow up blood cultures  -follow up urine cultures  -follow up respiratory pathogen panel  -follow up chest xray in the morning  -ordered vanc and zosyn with dialysis  -trending lactic acid  -tylenol PRN  -neuro checks   ESRD on T/Th/Sa dialysis  K 5.2 on admission, she began dialysis in January and reports good compliance. Nephrology was consulted in the ED and took her for emergent dialysis this evening.  - will attempt to avoid unnecessary fluids  -follow up phosphorus   Hyperglycemia  Patient presents with glucose 890 and bicarb 90. She has not been VBG revealed respiratory alkylosis not acidosis.  -CBG q 4 hr  - ordered insulin gtt  - should start D5 when CBG is <250  -  should start ISS when gap is closed x2  -NPO until SLP eval and treatment   Dispo: Admit patient to Inpatient with expected length of stay greater than 2 midnights.  Signed: Ledell Noss, MD 12/02/2016, 8:04 PM  Pager: [phone]

## 2016-12-02 NOTE — ED Provider Notes (Signed)
[facility] DEPT Provider Note   CSN: [id number] Arrival date & time: 12/02/16  1213     History   Chief Complaint Chief Complaint  Patient presents with  . Altered Mental Status    HPI Sarah Kelley is a 66 y.o. female.  HPI Patient presents to the emergency department with altered mental status, fever and elevated blood sugars.  Patient is unable to give any history due to her altered mental status.  I am obtaining the history through her grandson and son that over the last week she has not been eating very well.  Therefore, they have not been giving her her insulin.  Patient also complained yesterday of some body aches.  The patient normally uses a wheelchair for mobilization.  She has had increasing confusion as well over the last few weeks.  Patient apparently has not finished her last few dialysis treatments Past Medical History:  Diagnosis Date  . Diabetes mellitus   . Hyperlipidemia   . Hypertension   . Neuropathy    Diabetic  . Pneumonia 07/2016  . Renal disorder   . Thyroid disease     Patient Active Problem List   Diagnosis Date Noted  . Acute encephalopathy 07/28/2016  . CAP (community acquired pneumonia) 07/28/2016  . Pneumonia 07/28/2016  . ESRD (end stage renal disease) ([place])   . Phantom pain ([place]) 09/13/2015  . Hyperlipidemia 06/01/2015  . 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 Insulin-requiring or dependent type II diabetes mellitus ([place])   . Chronic kidney disease (CKD) stage G4/A1, severely decreased glomerular filtration rate (GFR) between 15-29 mL/min/1.73 square meter and albuminuria creatinine ratio less than 30 mg/g ([facility]) 05/18/2015  . Diabetic infection of left foot ([place]) 05/18/2015  . Osteomyelitis ([place]) 05/18/2015  . Abdominal pain 05/18/2015  . Osteomyelitis of left foot ([place]) 05/18/2015  . Diabetic neuropathy ([place]) 01/14/2007  . Diabetes mellitus without complication ([place]) 09/47/0962  . HLD (hyperlipidemia) 01/14/2007  . Normocytic  anemia 01/14/2007  . ANXIETY 01/14/2007  . Depression 01/14/2007  . Essential hypertension 01/14/2007    Past Surgical History:  Procedure Laterality Date  . AMPUTATION Left 05/21/2015   Procedure: Left Foot 3rd Ray Amputation;  Surgeon: Newt Minion, MD;  Location: [facility];  Service: Orthopedics;  Laterality: Left;  . AV FISTULA PLACEMENT Left 03/28/2016   Procedure: ARTERIOVENOUS (AV) FISTULA CREATION LEFT ARM;  Surgeon: Waynetta Sandy, MD;  Location: [facility];  Service: Vascular;  Laterality: Left;  . DILATION AND CURETTAGE OF UTERUS    . FISTULA SUPERFICIALIZATION Left 09/04/2016   Procedure: FISTULA SUPERFICIALIZATION LEFT UPPER ARM;  Surgeon: Waynetta Sandy, MD;  Location: [facility];  Service: Vascular;  Laterality: Left;  . IR AV DIALY SHUNT INTRO NEEDLE/INTRACATH INITIAL W/PTA/IMG LEFT  11/10/2016  . IR GENERIC HISTORICAL  08/01/2016   IR FLUORO GUIDE CV LINE RIGHT 08/01/2016 [name], MD [facility]  . IR GENERIC HISTORICAL  08/01/2016   IR US GUIDE VASC ACCESS RIGHT 08/01/2016 [name], MD [facility]  . IR REMOVAL TUN CV CATH W/O FL  11/01/2016  . IR US GUIDE VASC ACCESS LEFT  11/10/2016    OB History    No data available       Home Medications    Prior to Admission medications   Medication Sig Start Date End Date Taking? Authorizing Provider  atorvastatin (LIPITOR) 40 MG tablet Take 1 tablet (40 mg total) by mouth daily. 10/11/16  Yes Arnoldo Morale, MD  cetirizine (ZYRTEC) 10  MG tablet Take 1 tablet (10 mg total) by mouth daily. 10/11/16  Yes Arnoldo Morale, MD  DULoxetine (CYMBALTA) 60 MG capsule Take 1 capsule (60 mg total) by mouth daily. 10/11/16  Yes Arnoldo Morale, MD  gabapentin (NEURONTIN) 300 MG capsule Take 600 mg by mouth at bedtime.  08/21/16  Yes [provider]  olopatadine (PATANOL) 0.1 % ophthalmic solution Place 1 drop into both eyes 2 (two) times daily. 10/11/16  Yes Arnoldo Morale, MD  calcium acetate (PHOSLO) 667 MG capsule Take 2  capsules (1,334 mg total) by mouth 3 (three) times daily with meals. Patient not taking: Reported on 10/06/2016 08/04/16   Rai, Vernelle Emerald, MD  insulin detemir (LEVEMIR) 100 UNIT/ML injection Inject 0.1 mLs (10 Units total) into the skin at bedtime. 10/12/16   Arnoldo Morale, MD  LANTUS 100 UNIT/ML injection Inject 8-10 Units into the skin 2 (two) times daily. MORNING and NOON(TIME) 10/11/16   [provider]  valsartan (DIOVAN) 160 MG tablet Take 160 mg by mouth daily. 08/16/16   [provider]    Family History No family history on file.  Social History Social History  Substance Use Topics  . Smoking status: Never Smoker  . Smokeless tobacco: Never Used  . Alcohol use No     Comment: rare- a beer every now and then     Allergies   No known allergies   Review of Systems Review of Systems Level V caveat applies due to altered mental status  Physical Exam Updated Vital Signs BP (!) 94/42   Pulse 77   Temp (!) 101.6 F (38.7 C)   Resp 16   Ht 5\' 2"  (1.575 m)   Wt 69 kg (152 lb 1.9 oz)   LMP  (LMP Unknown)   SpO2 96%   BMI 27.82 kg/m   Physical Exam  Constitutional: She appears well-developed and well-nourished. No distress.  HENT:  Head: Normocephalic and atraumatic.  Mouth/Throat: Oropharynx is clear and moist.  Eyes: Pupils are equal, round, and reactive to light.  Neck: Normal range of motion. Neck supple.  Cardiovascular: Normal rate, regular rhythm and normal heart sounds.  Exam reveals no gallop and no friction rub.   No murmur heard. Pulmonary/Chest: Effort normal and breath sounds normal. No respiratory distress. She has no wheezes. She has no rales.  Abdominal: Soft. Bowel sounds are normal. She exhibits no distension. There is no tenderness.  Neurological: She is unresponsive. She exhibits normal muscle tone. Coordination normal. GCS eye subscore is 1. GCS verbal subscore is 1. GCS motor subscore is 4.  Skin: Skin is warm and dry. Capillary  refill takes less than 2 seconds. No rash noted. No erythema.  Psychiatric: She has a normal mood and affect. Her behavior is normal.  Nursing note and vitals reviewed.    ED Treatments / Results  Labs (all labs ordered are listed, but only abnormal results are displayed) Labs Reviewed  CBC WITH DIFFERENTIAL/PLATELET - Abnormal; Notable for the following:       Result Value   WBC 23.8 (*)    RDW 17.5 (*)    Neutro Abs 21.7 (*)    Monocytes Absolute 1.3 (*)    All other components within normal limits  URINALYSIS, COMPLETE (UACMP) WITH MICROSCOPIC - Abnormal; Notable for the following:    APPearance TURBID (*)    Glucose, UA >=500 (*)    Hgb urine dipstick MODERATE (*)    Ketones, ur 5 (*)    Protein, ur  100 (*)    Leukocytes, UA MODERATE (*)    Bacteria, UA RARE (*)    All other components within normal limits  CBG MONITORING, ED - Abnormal; Notable for the following:    Glucose-Capillary >600 (*)    All other components within normal limits  I-STAT VENOUS BLOOD GAS, ED - Abnormal; Notable for the following:    pH, Ven 7.432 (*)    pCO2, Ven 39.1 (*)    pO2, Ven 104.0 (*)    All other components within normal limits  I-STAT CG4 LACTIC ACID, ED - Abnormal; Notable for the following:    Lactic Acid, Venous 2.78 (*)    All other components within normal limits  CULTURE, BLOOD (ROUTINE X 2)  CULTURE, BLOOD (ROUTINE X 2)  URINE CULTURE  COMPREHENSIVE METABOLIC PANEL    EKG  EKG Interpretation  Date/Time:  Saturday December 02 2016 12:47:57 EDT Ventricular Rate:  91 PR Interval:    QRS Duration: 97 QT Interval:  394 QTC Calculation: 485 R Axis:   -50 Text Interpretation:  Sinus rhythm Left anterior fascicular block Left ventricular hypertrophy Nonspecific T wave abnormality Confirmed by Ashok Cordia  MD, Lennette Bihari ([id number]) on 12/02/2016 12:50:35 PM       Radiology Dg Chest Port 1 View  Result Date: 12/02/2016 CLINICAL DATA:  Fever, unresponsive EXAM: PORTABLE CHEST 1 VIEW  COMPARISON:  07/28/2016; 12/31/2008 FINDINGS: Grossly unchanged enlarged cardiac silhouette and mediastinal contours with atherosclerotic plaque when the thoracic aorta. Overall improved aeration of lungs with persistent bibasilar opacities, likely atelectasis. Trace pleural effusions are not excluded. No definite evidence of edema. No pneumothorax. No acute osseus abnormalities. IMPRESSION: Overall improved aeration of lungs with persistent trace effusions and bibasilar opacities, likely atelectasis. Further evaluation with a PA and lateral chest radiograph may be obtained as clinically indicated. Electronically Signed   By: [name] M.D.   On: 12/02/2016 13:02    Procedures Procedures (including critical care time)  Medications Ordered in ED Medications  dextrose 5 %-0.45 % sodium chloride infusion (not administered)  insulin regular (NOVOLIN R,HUMULIN R) 100 Units in sodium chloride 0.9 % 100 mL (1 Units/mL) infusion (5.4 Units/hr Intravenous New Bag/Given 12/02/16 1405)  piperacillin-tazobactam (ZOSYN) IVPB 3.375 g (not administered)  vancomycin (VANCOCIN) 500 mg in sodium chloride 0.9 % 100 mL IVPB (not administered)  piperacillin-tazobactam (ZOSYN) IVPB 3.375 g (0 g Intravenous Stopped 12/02/16 1330)  vancomycin (VANCOCIN) IVPB 1000 mg/200 mL premix (0 mg Intravenous Stopped 12/02/16 1430)  acetaminophen (TYLENOL) suppository 650 mg (650 mg Rectal Given 12/02/16 1348)  sodium chloride 0.9 % bolus 500 mL (0 mLs Intravenous Stopped 12/02/16 1431)     Initial Impression / Assessment and Plan / ED Course  I have reviewed the triage vital signs and the nursing notes.  Pertinent labs & imaging results that were available during my care of the patient were reviewed by me and considered in my medical decision making (see chart for details).     Spoke with critical care and nephrology.  They will evaluate the patient for admission. Patient's blood pressures have trended downward.  Will give IV  fluids.   Spoke with the critical care physician who felt that the patient could go to stepdown at this time.  He feels that she is improving based on the fact that she will follow some commands  Final Clinical Impressions(s) / ED Diagnoses   Final diagnoses:  None  CRITICAL CARE Performed by: Brent General Total critical care time: 45 minutes Critical  care time was exclusive of separately billable procedures and treating other patients. Critical care was necessary to treat or prevent imminent or life-threatening deterioration. Critical care was time spent personally by me on the following activities: development of treatment plan with patient and/or surrogate as well as nursing, discussions with consultants, evaluation of patient's response to treatment, examination of patient, obtaining history from patient or surrogate, ordering and performing treatments and interventions, ordering and review of laboratory studies, ordering and review of radiographic studies, pulse oximetry and re-evaluation of patient's condition. Patient required close monitoring for her blood pressure which ultimately required IV fluids for resuscitation purposes and she is septic  New Prescriptions New Prescriptions   No medications on file     Dalia Heading, PA-C 12/02/16 [place], [place], PA-C 12/02/16 1524    Lajean Saver, MD 12/02/16 [phone]

## 2016-12-02 NOTE — ED Notes (Signed)
Admitting doctors at the bedside 

## 2016-12-02 NOTE — ED Notes (Signed)
Report called to the dialysis rn

## 2016-12-02 NOTE — Progress Notes (Signed)
CRITICAL VALUE ALERT  Critical Value:  Latic acid 4.3  Date & Time Notied:  02 December 2298  Provider Notified: Dr Philipp Ovens  Orders Received/Actions taken: awaiting orders

## 2016-12-02 NOTE — Progress Notes (Signed)
Went to bedside to evaluate. Somnolent but appears comfortable. Vitals stable. Exam is reassuring. She is breathing comfortably and lungs are clear bilaterally, RRR. CBG now 274, BMP pending. Will likely transition to SSI if CBG continues to downtrend < 250. Continue to monitor. Will follow up labs.   Velna Ochs, M.D. Pager: [phone] 12/02/2016, 10:31 PM

## 2016-12-02 NOTE — Procedures (Signed)
Patient seen on Hemodialysis. QB 250, UF goal 0.5L Treatment adjusted as needed.  Elmarie Shiley MD [facility]. Office # [phone] Pager # [phone] 6:33 PM

## 2016-12-02 NOTE — Progress Notes (Signed)
HD tx completed w/o problem, UF goal met (keept even per MD order), blood rinsed back, report called to Coy Saunas , RN

## 2016-12-03 ENCOUNTER — Inpatient Hospital Stay (HOSPITAL_COMMUNITY): Payer: Medicaid Other

## 2016-12-03 DIAGNOSIS — B9689 Other specified bacterial agents as the cause of diseases classified elsewhere: Secondary | ICD-10-CM

## 2016-12-03 DIAGNOSIS — X58XXXA Exposure to other specified factors, initial encounter: Secondary | ICD-10-CM

## 2016-12-03 DIAGNOSIS — S31821A Laceration without foreign body of left buttock, initial encounter: Secondary | ICD-10-CM

## 2016-12-03 LAB — RESPIRATORY PANEL BY PCR
Adenovirus: NOT DETECTED
Bordetella pertussis: NOT DETECTED
Chlamydophila pneumoniae: NOT DETECTED
Coronavirus 229E: NOT DETECTED
Coronavirus HKU1: NOT DETECTED
Coronavirus NL63: NOT DETECTED
Coronavirus OC43: NOT DETECTED
INFLUENZA A-RVPPCR: NOT DETECTED
INFLUENZA B-RVPPCR: NOT DETECTED
METAPNEUMOVIRUS-RVPPCR: NOT DETECTED
Mycoplasma pneumoniae: NOT DETECTED
PARAINFLUENZA VIRUS 1-RVPPCR: NOT DETECTED
PARAINFLUENZA VIRUS 2-RVPPCR: NOT DETECTED
PARAINFLUENZA VIRUS 4-RVPPCR: NOT DETECTED
Parainfluenza Virus 3: NOT DETECTED
RESPIRATORY SYNCYTIAL VIRUS-RVPPCR: NOT DETECTED
Rhinovirus / Enterovirus: NOT DETECTED

## 2016-12-03 LAB — BASIC METABOLIC PANEL
ANION GAP: 12 (ref 5–15)
ANION GAP: 9 (ref 5–15)
BUN: 27 mg/dL — ABNORMAL HIGH (ref 6–20)
BUN: 25 mg/dL — AB (ref 6–20)
CHLORIDE: 94 mmol/L — AB (ref 101–111)
CHLORIDE: 97 mmol/L — AB (ref 101–111)
CO2: 29 mmol/L (ref 22–32)
CO2: 29 mmol/L (ref 22–32)
Calcium: 8.8 mg/dL — ABNORMAL LOW (ref 8.9–10.3)
Calcium: 8.9 mg/dL (ref 8.9–10.3)
Creatinine, Ser: 4.1 mg/dL — ABNORMAL HIGH (ref 0.44–1.00)
Creatinine, Ser: 4.24 mg/dL — ABNORMAL HIGH (ref 0.44–1.00)
GFR calc Af Amer: 12 mL/min — ABNORMAL LOW (ref >60)
GFR calc Af Amer: 12 mL/min — ABNORMAL LOW (ref >60)
GFR calc non Af Amer: 10 mL/min — ABNORMAL LOW (ref >60)
GFR calc non Af Amer: 10 mL/min — ABNORMAL LOW (ref >60)
GLUCOSE: 141 mg/dL — AB (ref 65–99)
GLUCOSE: 147 mg/dL — AB (ref 65–99)
POTASSIUM: 3.7 mmol/L (ref 3.5–5.1)
POTASSIUM: 3.8 mmol/L (ref 3.5–5.1)
Sodium: 135 mmol/L (ref 135–145)
Sodium: 135 mmol/L (ref 135–145)

## 2016-12-03 LAB — CBC WITH DIFFERENTIAL/PLATELET
BASOS ABS: 0 10*3/uL (ref 0.0–0.1)
BASOS PCT: 0 %
Eosinophils Absolute: 0.1 10*3/uL (ref 0.0–0.7)
Eosinophils Relative: 1 %
HEMATOCRIT: 33 % — AB (ref 36.0–46.0)
HEMOGLOBIN: 10.4 g/dL — AB (ref 12.0–15.0)
Lymphocytes Relative: 7 %
Lymphs Abs: 1.5 10*3/uL (ref 0.7–4.0)
MCH: 28.3 pg (ref 26.0–34.0)
MCHC: 31.5 g/dL (ref 30.0–36.0)
MCV: 89.7 fL (ref 78.0–100.0)
Monocytes Absolute: 1 10*3/uL (ref 0.1–1.0)
Monocytes Relative: 4 %
NEUTROS PCT: 88 %
Neutro Abs: 19.5 10*3/uL — ABNORMAL HIGH (ref 1.7–7.7)
Platelets: 228 10*3/uL (ref 150–400)
RBC: 3.68 MIL/uL — ABNORMAL LOW (ref 3.87–5.11)
RDW: 17.3 % — ABNORMAL HIGH (ref 11.5–15.5)
WBC: 22.1 10*3/uL — ABNORMAL HIGH (ref 4.0–10.5)

## 2016-12-03 LAB — GLUCOSE, CAPILLARY
GLUCOSE-CAPILLARY: 155 mg/dL — AB (ref 65–99)
GLUCOSE-CAPILLARY: 102 mg/dL — AB (ref 65–99)
GLUCOSE-CAPILLARY: 137 mg/dL — AB (ref 65–99)
Glucose-Capillary: 125 mg/dL — ABNORMAL HIGH (ref 65–99)
Glucose-Capillary: 178 mg/dL — ABNORMAL HIGH (ref 65–99)
Glucose-Capillary: 153 mg/dL — ABNORMAL HIGH (ref 65–99)

## 2016-12-03 LAB — URINE CULTURE

## 2016-12-03 LAB — LACTIC ACID, PLASMA: Lactic Acid, Venous: 2.7 mmol/L (ref 0.5–1.9)

## 2016-12-03 MED ORDER — VANCOMYCIN HCL 500 MG IV SOLR: 500.0000 mg | INTRAVENOUS | Status: DC

## 2016-12-03 MED ORDER — DARBEPOETIN ALFA 60 MCG/0.3ML IJ SOSY: 60.0000 ug | PREFILLED_SYRINGE | INTRAMUSCULAR | Status: DC

## 2016-12-03 MED ORDER — ACETAMINOPHEN 325 MG PO TABS: 650.0000 mg | ORAL_TABLET | Freq: Four times a day (QID) | ORAL | Status: DC | PRN

## 2016-12-03 MED ORDER — VANCOMYCIN HCL 500 MG IV SOLR
500.0000 mg | Freq: Once | INTRAVENOUS | Status: AC
  Administered 2016-12-03: 500 mg via INTRAVENOUS
  Filled 2016-12-03: qty 500

## 2016-12-03 MED ORDER — INSULIN ASPART 100 UNIT/ML ~~LOC~~ SOLN
0.0000 [IU] | Freq: Three times a day (TID) | SUBCUTANEOUS | Status: DC
  Administered 2016-12-03: 2 [IU] via SUBCUTANEOUS
  Administered 2016-12-04: 9 [IU] via SUBCUTANEOUS
  Administered 2016-12-04: 5 [IU] via SUBCUTANEOUS
  Administered 2016-12-05: 3 [IU] via SUBCUTANEOUS

## 2016-12-03 MED ORDER — ACETAMINOPHEN 500 MG PO TABS
1000.0000 mg | ORAL_TABLET | Freq: Four times a day (QID) | ORAL | Status: DC | PRN
  Administered 2016-12-03 – 2016-12-05 (×2): 1000 mg via ORAL
  Filled 2016-12-03 (×2): qty 2

## 2016-12-03 MED ORDER — RENA-VITE PO TABS
1.0000 | ORAL_TABLET | Freq: Every day | ORAL | Status: DC
  Administered 2016-12-03 – 2016-12-05 (×3): 1 via ORAL
  Filled 2016-12-03 (×3): qty 1

## 2016-12-03 MED ORDER — DARBEPOETIN ALFA 60 MCG/0.3ML IJ SOSY
60.0000 ug | PREFILLED_SYRINGE | INTRAMUSCULAR | Status: DC
  Administered 2016-12-05: 60 ug via INTRAVENOUS
  Filled 2016-12-03: qty 0.3

## 2016-12-03 NOTE — Progress Notes (Signed)
Subjective: Interval History: has no complaint, per son , feeling better, .  Objective: Vital signs in last 24 hours: Temp:  [97.6 F (36.4 C)-101.6 F (38.7 C)] 100.5 F (38.1 C) (06/03 0654) Pulse Rate:  [70-96] 92 (06/03 0900) Resp:  [12-22] 12 (06/03 0900) BP: (93-156)/(41-79) 109/50 (06/03 0900) SpO2:  [94 %-100 %] 98 % (06/03 0900) Weight:  [59.4 kg (130 lb 15.3 oz)-69 kg (152 lb 1.9 oz)] 59.4 kg (130 lb 15.3 oz) (06/02 2110) Weight change:   Intake/Output from previous day: 06/02 0701 - 06/03 0700 In: 1861.6 [I.V.:1811.6; IV Piggyback:50] Out: 0  Intake/Output this shift: No intake/output data recorded.  General appearance: cooperative, no distress, moderately obese, pale and slowed mentation Resp: clear to auscultation bilaterally Cardio: S1, S2 normal and systolic murmur: systolic ejection 2/6, decrescendo at 2nd left intercostal space GI: obese, soft, pos bs. tender lower abdm Extremities: AVF LUA  Lab Results:  Recent Labs  12/02/16 1237 12/03/16 0526  WBC 23.8* 22.1*  HGB 12.4 10.4*  HCT 39.1 33.0*  PLT 274 228   BMET:  Recent Labs  12/03/16 0049 12/03/16 0526  NA 135 135  K 3.8 3.7  CL 97* 94*  CO2 29 29  GLUCOSE 141* 147*  BUN 25* 27*  CREATININE 4.10* 4.24*  CALCIUM 8.8* 8.9   No results for input(s): PTH in the last 72 hours. Iron Studies: No results for input(s): IRON, TIBC, TRANSFERRIN, FERRITIN in the last 72 hours.  Studies/Results: Dg Chest 2 View  Result Date: 12/03/2016 CLINICAL DATA:  Sepsis and altered mental status. EXAM: CHEST  2 VIEW COMPARISON:  12/02/2016 FINDINGS: AP and lateral views of the chest show upper normal heart size with mild vascular congestion. There is bibasilar atelectasis with small bilateral pleural effusions. The visualized bony structures of the thorax are intact. Telemetry leads overlie the chest. IMPRESSION: Bibasilar atelectasis with small bilateral pleural effusions. Electronically Signed   By: Misty Stanley M.D.   On: 12/03/2016 08:15   Dg Chest Port 1 View  Result Date: 12/02/2016 CLINICAL DATA:  Fever, unresponsive EXAM: PORTABLE CHEST 1 VIEW COMPARISON:  07/28/2016; 12/31/2008 FINDINGS: Grossly unchanged enlarged cardiac silhouette and mediastinal contours with atherosclerotic plaque when the thoracic aorta. Overall improved aeration of lungs with persistent bibasilar opacities, likely atelectasis. Trace pleural effusions are not excluded. No definite evidence of edema. No pneumothorax. No acute osseus abnormalities. IMPRESSION: Overall improved aeration of lungs with persistent trace effusions and bibasilar opacities, likely atelectasis. Further evaluation with a PA and lateral chest radiograph may be obtained as clinically indicated. Electronically Signed   By: [name] M.D.   On: 12/02/2016 13:02    I have reviewed the patient's current medications.  Assessment/Plan: 1 ESRD HD yest.  2 Anemia  Will use esa 3 DM controlled 4 HPTH  5 Urosepsis on AB, Vanc/Zosyn 6 Dementia P HD TTS, cont AB, bs control    LOS: 1 day   Bryttany Tortorelli L 12/03/2016,9:52 AM

## 2016-12-03 NOTE — Progress Notes (Signed)
   Subjective: Sarah Kelley is more alert today, she says she is feeling better when seen during rounds and denies concerns or complaints. She is lethargic and falls asleep during our conversation. Her son is in the room and is updated on the plan, he ask about the cause of her sepsis. I received a call in the afternoon that the nurse had heard from her family that the patient had been reporting tooth pain at home.   Objective:  Vital signs in last 24 hours: Vitals:   12/03/16 0600 12/03/16 0654 12/03/16 0900 12/03/16 1206  BP: 121/79 (!) 117/48 (!) 109/50 (!) 125/52  Pulse: 87 86 92   Resp: (!) 21 (!) 21 12 (!) 21  Temp:  (!) 100.5 F (38.1 C)  (!) 100.4 F (38 C)  TempSrc:  Axillary  Axillary  SpO2: 96% 99% 98%   Weight:      Height:       Physical Exam  Constitutional: No distress.  Appears older than stated age   HENT:  Head: Normocephalic and atraumatic.  Poor dentition   Eyes: Conjunctivae are normal. Right eye exhibits no discharge. Left eye exhibits no discharge. No scleral icterus.  Cardiovascular: Normal rate and regular rhythm.   No murmur heard. Pulmonary/Chest: Effort normal. No respiratory distress.  Abdominal: Soft. Bowel sounds are normal. She exhibits no distension. There is no tenderness. There is no guarding.  Neurological:  More alert than yesterday   Skin: Skin is warm and dry. She is not diaphoretic.  Small skin tear on left buttocks, tenderness without surrounding erythema. No other skin breakdown on full body skin check.    Assessment/Plan:  Principal Problem:   Sepsis ([place]) Active Problems:   ESRD (end stage renal disease) ([place])   Hyperglycemia   Sepsis secondary to urinary tract infection  Urine cultures returned with multiple species and suggested recollection however she has already received antibiotics. Respiratory pathogen panel negative. Lactic acid increased then returned to 2.7 overnight. Clinically she is much more alert and  responsive and is feeling better today. She seems to be recovering well from urosepsis, we will continue broad spectrum antibiotics for at least one more day.  - Ordered Orthopantogram  - vanc and zosyn 6/2 >>  -follow up blood cultures  -follow up respiratory pathogen panel  - limit fluids in this HD patient  -tylenol PRN   ESRD on T/Th/Sa HD  Went for HD yesterday, her K is improved today.  - nephrology is following, we appreciate their advice   Hyperglycemia  Resolved with insulin gtt, she was transitioned to sliding scale and had a gap of 12 this morning.  - continue ISS sensitive  -continue detemir 10 qHS  - continue CBG monitoring   Dispo: Anticipated discharge in approximately 2-4 day(s).   Ledell Noss, MD 12/03/2016, 12:56 PM Pager: [phone]

## 2016-12-03 NOTE — Evaluation (Signed)
Clinical/Bedside Swallow Evaluation Patient Details  Name: Sarah Kelley MRN: [id number] Date of Birth: 1951-02-27  Today's Date: 12/03/2016 Time: SLP Start Time (ACUTE ONLY): 1215 SLP Stop Time (ACUTE ONLY): 1239 SLP Time Calculation (min) (ACUTE ONLY): 24 min  Past Medical History:  Past Medical History:  Diagnosis Date  . Diabetes mellitus   . Hyperlipidemia   . Hypertension   . Neuropathy    Diabetic  . Pneumonia 07/2016  . Renal disorder   . Thyroid disease    Past Surgical History:  Past Surgical History:  Procedure Laterality Date  . AMPUTATION Left 05/21/2015   Procedure: Left Foot 3rd Ray Amputation;  Surgeon: Newt Minion, MD;  Location: [facility];  Service: Orthopedics;  Laterality: Left;  . AV FISTULA PLACEMENT Left 03/28/2016   Procedure: ARTERIOVENOUS (AV) FISTULA CREATION LEFT ARM;  Surgeon: Waynetta Sandy, MD;  Location: [facility];  Service: Vascular;  Laterality: Left;  . DILATION AND CURETTAGE OF UTERUS    . FISTULA SUPERFICIALIZATION Left 09/04/2016   Procedure: FISTULA SUPERFICIALIZATION LEFT UPPER ARM;  Surgeon: Waynetta Sandy, MD;  Location: [place];  Service: Vascular;  Laterality: Left;  . IR AV DIALY SHUNT INTRO NEEDLE/INTRACATH INITIAL W/PTA/IMG LEFT  11/10/2016  . IR GENERIC HISTORICAL  08/01/2016   IR FLUORO GUIDE CV LINE RIGHT 08/01/2016 [name], MD [facility]  . IR GENERIC HISTORICAL  08/01/2016   IR US GUIDE VASC ACCESS RIGHT 08/01/2016 [name], MD [facility]  . IR REMOVAL TUN CV CATH W/O FL  11/01/2016  . IR US GUIDE VASC ACCESS LEFT  11/10/2016   HPI:  66 yo woman with PMH ESRD who started T/R/Sa dialysis 07/2016, depression, hypertension, and insulin dependent diabetes mellitus who presents to the ED with altered mental status. The majority of this history is taken from her son and grandson as the patient is spanish speaking and disoriented. Her family reports she was less responsive and her skin felt warm last night. This  morning she became less responsive - she is not speaking but is moving all extremities. Prior to today she described body ache for days. She was also having decreased appetite for the past few days She did not complain of difficulty breathing, sick contacts, nausea, vomiting, abdominal pain, or skin breakdown. She has urinary incontinence and wears briefs. Her son has noticed that she has had foul smelling urine. Her niece reports that she was admitted for sepsis and pneumonia in January and has had difficulty taking care of herself since then, chart review of notes at that time mention that she was having intermittent hallucination and confusion, this admission is when she began dialysis. Her family does not report any missed dialysis sessions.   Most recent chest xray is showing bibasilar atelectasis with bilateral pleural effusions.    Assessment / Plan / Recommendation Clinical Impression  Clinical swallowing evaluation was completed using thin liquids and dry solids.  An oral mechanism exam was attempted and not completed due to the patient's inability to follow commands.  The patient's family that were present reported a few week decline in her ability to follow commands.  Initially when presented with water and applesauce the patient was noted to hold the material for an extended period of time and then to expectorate the material.  The family reported that the patient does not like water or applesauce.  The patient was more agreeable to swallow given diet coke.  The patient presented with oral and pharyngeal dysphagia characterized  by delayed oral transit for dry solids and mildly delayed swallow trigger.  Overt s/s of aspiration were not seen despite thorough challenging.  However, patient possibly has PNA and per family was admitted in 07/2016 for sepsis and PNA.  Per the family she has never had her swallowing evaluated that they are aware of.  Given current clinical presentation recommend a  dysphagia 3 diet with thin liquids.  Given possible PNA with history of recent PNA ST to follow up.    SLP Visit Diagnosis: Dysphagia, oropharyngeal phase (R13.12)    Aspiration Risk  Mild aspiration risk    Diet Recommendation   Dysphagia 3 with thin liquids  Medication Administration: Whole meds with liquid    Other  Recommendations Oral Care Recommendations: Oral care BID   Follow up Recommendations Other (comment) (TBD)      Frequency and Duration min 2x/week  2 weeks       Prognosis Prognosis for Safe Diet Advancement: Good      Swallow Study   General Date of Onset: 12/02/16 HPI:  66 yo woman with PMH ESRD who started T/R/Sa dialysis 07/2016, depression, hypertension, and insulin dependent diabetes mellitus who presents to the ED with altered mental status. The majority of this history is taken from her son and grandson as the patient is spanish speaking and disoriented. Her family reports she was less responsive and her skin felt warm last night. This morning she became less responsive - she is not speaking but is moving all extremities. Prior to today she described body ache for days. She was also having decreased appetite for the past few days She did not complain of difficulty breathing, sick contacts, nausea, vomiting, abdominal pain, or skin breakdown. She has urinary incontinence and wears briefs. Her son has noticed that she has had foul smelling urine. Her niece reports that she was admitted for sepsis and pneumonia in January and has had difficulty taking care of herself since then, chart review of notes at that time mention that she was having intermittent hallucination and confusion, this admission is when she began dialysis. Her family does not report any missed dialysis sessions.   Most recent chest xray is showing bibasilar atelectasis with bilateral pleural effusions.  Type of Study: Bedside Swallow Evaluation Previous Swallow Assessment: [facility] at [facility]. Diet  Prior to this Study: Regular;Thin liquids Temperature Spikes Noted: Yes Respiratory Status: Room air History of Recent Intubation: No Behavior/Cognition: Alert;Cooperative;Uncooperative;Requires cueing;Doesn't follow directions Oral Cavity Assessment: Dry Oral Care Completed by SLP: No Oral Cavity - Dentition: Poor condition;Missing dentition Self-Feeding Abilities: Total assist Patient Positioning: Upright in bed Baseline Vocal Quality: Low vocal intensity Volitional Cough: Cognitively unable to elicit Volitional Swallow: Unable to elicit    Oral/Motor/Sensory Function Overall Oral Motor/Sensory Function:  (Unable to assess as the patient was unable to follow command)   Ice Chips Ice chips: Impaired Presentation: Spoon Oral Phase Impairments: Reduced labial seal Oral Phase Functional Implications: Right anterior spillage;Left anterior spillage Pharyngeal Phase Impairments:  (No pharyngeal swallow was initiated.  )   Thin Liquid Thin Liquid: Impaired Presentation: Straw Pharyngeal  Phase Impairments: Suspected delayed Swallow    Nectar Thick Nectar Thick Liquid: Not tested   Honey Thick Honey Thick Liquid: Not tested   Puree Puree: Impaired Presentation: Spoon Oral Phase Impairments:  (Patient spit all the pureed material out.  )   Solid   GO   Solid: Impaired Presentation: Spoon Oral Phase Impairments: Impaired mastication Oral Phase Functional Implications: Oral residue;Impaired mastication;Prolonged  oral transit Pharyngeal Phase Impairments: Suspected delayed Swallow       Shelly Flatten, MA, CCC-SLP Acute Rehab SLP [phone] Sarah Kelley 12/03/2016,12:59 PM

## 2016-12-04 ENCOUNTER — Inpatient Hospital Stay (HOSPITAL_COMMUNITY): Payer: Medicaid Other

## 2016-12-04 DIAGNOSIS — E1165 Type 2 diabetes mellitus with hyperglycemia: Secondary | ICD-10-CM

## 2016-12-04 DIAGNOSIS — R1310 Dysphagia, unspecified: Secondary | ICD-10-CM | POA: Diagnosis present

## 2016-12-04 DIAGNOSIS — N390 Urinary tract infection, site not specified: Secondary | ICD-10-CM | POA: Diagnosis present

## 2016-12-04 DIAGNOSIS — R6520 Severe sepsis without septic shock: Secondary | ICD-10-CM

## 2016-12-04 DIAGNOSIS — Z972 Presence of dental prosthetic device (complete) (partial): Secondary | ICD-10-CM

## 2016-12-04 DIAGNOSIS — R1312 Dysphagia, oropharyngeal phase: Secondary | ICD-10-CM

## 2016-12-04 LAB — GLUCOSE, CAPILLARY
GLUCOSE-CAPILLARY: 122 mg/dL — AB (ref 65–99)
GLUCOSE-CAPILLARY: 231 mg/dL — AB (ref 65–99)
GLUCOSE-CAPILLARY: 350 mg/dL — AB (ref 65–99)
GLUCOSE-CAPILLARY: 350 mg/dL — AB (ref 65–99)
Glucose-Capillary: 416 mg/dL — ABNORMAL HIGH (ref 65–99)
Glucose-Capillary: 179 mg/dL — ABNORMAL HIGH (ref 65–99)
Glucose-Capillary: 67 mg/dL (ref 65–99)
Glucose-Capillary: 68 mg/dL (ref 65–99)
Glucose-Capillary: 358 mg/dL — ABNORMAL HIGH (ref 65–99)
Glucose-Capillary: 362 mg/dL — ABNORMAL HIGH (ref 65–99)
Glucose-Capillary: 366 mg/dL — ABNORMAL HIGH (ref 65–99)

## 2016-12-04 LAB — CBC
HCT: 34.6 % — ABNORMAL LOW (ref 36.0–46.0)
Hemoglobin: 10.9 g/dL — ABNORMAL LOW (ref 12.0–15.0)
MCH: 28.2 pg (ref 26.0–34.0)
MCHC: 31.5 g/dL (ref 30.0–36.0)
MCV: 89.4 fL (ref 78.0–100.0)
Platelets: 236 10*3/uL (ref 150–400)
RBC: 3.87 MIL/uL (ref 3.87–5.11)
RDW: 17.9 % — AB (ref 11.5–15.5)
WBC: 18.8 10*3/uL — ABNORMAL HIGH (ref 4.0–10.5)

## 2016-12-04 LAB — BASIC METABOLIC PANEL
Anion gap: 12 (ref 5–15)
BUN: 42 mg/dL — AB (ref 6–20)
CHLORIDE: 95 mmol/L — AB (ref 101–111)
CO2: 28 mmol/L (ref 22–32)
Calcium: 8.8 mg/dL — ABNORMAL LOW (ref 8.9–10.3)
Creatinine, Ser: 5.83 mg/dL — ABNORMAL HIGH (ref 0.44–1.00)
GFR calc Af Amer: 8 mL/min — ABNORMAL LOW (ref >60)
GFR, EST NON AFRICAN AMERICAN: 7 mL/min — AB (ref >60)
GLUCOSE: 129 mg/dL — AB (ref 65–99)
POTASSIUM: 4.5 mmol/L (ref 3.5–5.1)
Sodium: 135 mmol/L (ref 135–145)

## 2016-12-04 MED ORDER — DEXTROSE 5 % IV SOLN
2.0000 g | INTRAVENOUS | Status: DC
  Filled 2016-12-04: qty 2

## 2016-12-04 MED ORDER — IRBESARTAN 75 MG PO TABS
150.0000 mg | ORAL_TABLET | Freq: Every day | ORAL | Status: DC
  Administered 2016-12-05: 150 mg via ORAL
  Filled 2016-12-04 (×2): qty 2

## 2016-12-04 MED ORDER — CEFDINIR 125 MG/5ML PO SUSR
300.0000 mg | Freq: Every day | ORAL | Status: DC
  Administered 2016-12-04 – 2016-12-05 (×2): 300 mg via ORAL
  Filled 2016-12-04 (×4): qty 15

## 2016-12-04 MED ORDER — IRBESARTAN 75 MG PO TABS
75.0000 mg | ORAL_TABLET | Freq: Once | ORAL | Status: AC
  Administered 2016-12-04: 75 mg via ORAL
  Filled 2016-12-04: qty 1

## 2016-12-04 MED ORDER — IRBESARTAN 75 MG PO TABS
75.0000 mg | ORAL_TABLET | Freq: Every day | ORAL | Status: DC
  Administered 2016-12-04: 75 mg via ORAL
  Filled 2016-12-04: qty 1

## 2016-12-04 MED ORDER — INSULIN DETEMIR 100 UNIT/ML ~~LOC~~ SOLN
8.0000 [IU] | Freq: Every day | SUBCUTANEOUS | Status: DC
  Administered 2016-12-04: 8 [IU] via SUBCUTANEOUS
  Filled 2016-12-04 (×2): qty 0.08

## 2016-12-04 MED ORDER — DEXTROSE 50 % IV SOLN: 1.0000 | Freq: Once | INTRAVENOUS | Status: DC

## 2016-12-04 MED ORDER — INSULIN DETEMIR 100 UNIT/ML ~~LOC~~ SOLN
5.0000 [IU] | Freq: Every day | SUBCUTANEOUS | Status: DC
  Filled 2016-12-04: qty 0.05

## 2016-12-04 MED ORDER — IRBESARTAN 150 MG PO TABS: 150.0000 mg | ORAL_TABLET | Freq: Every day | ORAL | Status: DC

## 2016-12-04 NOTE — Progress Notes (Signed)
  Speech Language Pathology Treatment: Dysphagia  Patient Details Name: Sarah Kelley MRN: [id number] DOB: 1950-08-08 Today's Date: 12/04/2016 Time: 0920-0956 SLP Time Calculation (min) (ACUTE ONLY): 36 min  Assessment / Plan / Recommendation Clinical Impression  Pt is tolerated am meal with min assist for set up and self feeding due to baseline visual impairment. No signs of aspiration observed, mastication adequate in dentures in place. No further texture modification needed. SLP will sign off.   HPI HPI: 66 yo woman with PMH ESRD who started T/R/Sa dialysis 07/2016, depression, hypertension, and insulin dependent diabetes mellitus who presents to the ED with altered mental status. The majority of this history is taken from her son and grandson as the patient is spanish speaking and disoriented. Her family reports she was less responsive and her skin felt warm last night. This morning she became less responsive - she is not speaking but is moving all extremities. Prior to today she described body ache for days. She was also having decreased appetite for the past few days She did not complain of difficulty breathing, sick contacts, nausea, vomiting, abdominal pain, or skin breakdown. She has urinary incontinence and wears briefs. Her son has noticed that she has had foul smelling urine. Her niece reports that she was admitted for sepsis and pneumonia in January and has had difficulty taking care of herself since then, chart review of notes at that time mention that she was having intermittent hallucination and confusion, this admission is when she began dialysis. Her family does not report any missed dialysis sessions.   Most recent chest xray is showing bibasilar atelectasis with bilateral pleural effusions.       SLP Plan  All goals met       Recommendations  Diet recommendations: Regular;Thin liquid Liquids provided via: Cup;Straw Medication Administration: Whole meds with  liquid Supervision: Staff to assist with self feeding Compensations: Slow rate;Small sips/bites Postural Changes and/or Swallow Maneuvers: Seated upright 90 degrees                Oral Care Recommendations: Oral care BID Follow up Recommendations: None SLP Visit Diagnosis: Dysphagia, oropharyngeal phase (R13.12) Plan: All goals met       GO               Herbie Baltimore, MA CCC-SLP [phone]  Lynann Beaver 12/04/2016, 9:56 AM

## 2016-12-04 NOTE — Care Management Note (Signed)
Case Management Note  Patient Details  Name: Sarah Kelley MRN: [id number] Date of Birth: 08-10-1950  Subjective/Objective:  From home with spouse and daughter, speaks Spanish, presents with Sepsis, HD patient T,TH, Sat. Awaiting interpreter to speak with patient.  Looks like she follows up at the [facility] clinic.  She has a w/chair at home, per interpreter and she does go to [facility] clinic. They would not want her to go to a SNF they would rather her go home with  [facility] services.   [facility] she has appt for July 19 already per daughter.                   Action/Plan: NCM will follow for dc needs.  Expected Discharge Date:                  Expected Discharge Plan:  Home/Self Care  In-House Referral:     Discharge planning Services  CM Consult  Post Acute Care Choice:    Choice offered to:     DME Arranged:    DME Agency:     HH Arranged:    HH Agency:     Status of Service:  In process, will continue to follow  If discussed at Long Length of Stay Meetings, dates discussed:    Additional Comments:  Zenon Mayo, RN 12/04/2016, 12:49 PM

## 2016-12-04 NOTE — Progress Notes (Signed)
Admit: 12/02/2016 LOS: 2  57 ESRD THS [facility] admit with AMS and likely UTI  Subjective:  [place], BP stable on RA More awake and alert this morning, son at bedside left me know the patient has had some prolonged cognitive issues after pneumonia in January of this year WBC Downtrending B Cx 6/2 x2 NGTD U Cx mult species  On Vanc and Zosyn    06/03 0701 - 06/04 0700 In: 1 [IV Piggyback:50] Out: -   Filed Weights   12/02/16 1432 12/02/16 2110  Weight: 69 kg (152 lb 1.9 oz) 59.4 kg (130 lb 15.3 oz)    Scheduled Meds: . [START ON 12/05/2016] darbepoetin (ARANESP) injection - DIALYSIS  60 mcg Intravenous Q Tue-HD  . dextrose  1 ampule Intravenous Once  . heparin  5,000 Units Subcutaneous Q8H  . insulin aspart  0-9 Units Subcutaneous TID WC  . insulin detemir  5 Units Subcutaneous QHS  . irbesartan  75 mg Oral Daily  . multivitamin  1 tablet Oral QHS  . olopatadine  1 drop Both Eyes BID   Continuous Infusions: . piperacillin-tazobactam (ZOSYN)  IV Stopped (12/04/16 0201)  . [START ON 12/05/2016] vancomycin     PRN Meds:.acetaminophen, acetaminophen, alteplase, heparin, lidocaine (PF), lidocaine-prilocaine, pentafluoroprop-tetrafluoroeth  Current Labs: reviewed    Physical Exam:  Blood pressure (!) 153/85, pulse 79, temperature 97.4 F (36.3 C), temperature source Oral, resp. rate 15, height 5\' 2"  (1.575 m), weight 59.4 kg (130 lb 15.3 oz), SpO2 99 %. No acute distress, awake, alert, interactive RRR, normal S1 and S2 CTAB  Though somewhat diminshed in the bases No edema Bruit and thrill present in left upper arm fistula Nonfocal cranial nerves II through XII intact   Dialysis Orders: [facility] T, Th, S 4 hrs 180 NRe 400/800 58.5 kg 3.0K/2.25 Ca -Heparin 2000 units IV TIW -Hectoral 1 mcg IV TIW  A 1. ESRD THS [place] via LUE AVF 2. AMS; resolving 3. Likely UTI, on Vanc/Zosyn; B Cx NGTD, U Cx mixed species 4. ?Dementia 5. HTN 6. DM2 7. Anemia 8. CKD BMD  P 1. Cont HD on  THS Schedule if inpatient   Pearson Grippe MD 12/04/2016, 9:26 AM   Recent Labs Lab 12/02/16 2140 12/03/16 0049 12/03/16 0526 12/04/16 0337  NA 135 135 135 135  K 3.8 3.8 3.7 4.5  CL 95* 97* 94* 95*  CO2 25 29 29 28   GLUCOSE 240* 141* 147* 129*  BUN 24* 25* 27* 42*  CREATININE 3.85* 4.10* 4.24* 5.83*  CALCIUM 8.6* 8.8* 8.9 8.8*  PHOS 2.0*  --   --   --     Recent Labs Lab 12/02/16 1237 12/03/16 0526 12/04/16 0337  WBC 23.8* 22.1* 18.8*  NEUTROABS 21.7* 19.5*  --   HGB 12.4 10.4* 10.9*  HCT 39.1 33.0* 34.6*  MCV 92.2 89.7 89.4  PLT 274 228 236

## 2016-12-04 NOTE — Progress Notes (Signed)
Hypoglycemic Event  CBG: 68  Treatment: 15 GM carbohydrate snack twice  Symptoms: Hungry  Follow-up CBG: Time:0805 and 0233  CBG Result: 67, 122  Possible Reasons for Event: Inadequate meal intake  Comments/MD notified:Dr. Lucretia Field, Burman Nieves

## 2016-12-04 NOTE — Evaluation (Signed)
Physical Therapy Evaluation Patient Details Name: Sarah Kelley MRN: [id number] DOB: [birth year] Today's Date: 12/04/2016   History of Present Illness  66 y.o. female admitted to [facility] on 12/02/16 for increased confusion, weakness, ultimately dx with sepsis due to UTI.  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   Clinical Impression  Pt is generally weak and more confused than her baseline.  She is incontinent of stool multiple times today including during our session and she is unable to stand or walk.  We stood EOB with max assist twice; once with me and once with her daughter who provides care for her at home.  Family would like to take her home, however, I think she would benefit from SNF level rehab.  RN CM aware of family's preference, and on-site translator, Rob Bunting was present for our entire session.     Follow Up Recommendations SNF;Supervision/Assistance - 24 hour (if family refuses SNF max HH services)    Equipment Recommendations  None recommended by PT    Recommendations for Other Services   NA    Precautions / Restrictions Precautions Precautions: Fall      Mobility  Bed Mobility Overal bed mobility: Needs Assistance Bed Mobility: Supine to Sit;Sit to Supine;Rolling Rolling: Max assist   Supine to sit: Max assist;HOB elevated Sit to supine: Max assist   General bed mobility comments: Max assist to move bil legs to the EOB, max assist to support trunk during transition to sitting.  Pt with significant posterior lean throughout transitions which got better once her feet could feel the floor. Rolled multiple time for peri care and cleaning.   Transfers Overall transfer level: Needs assistance   Transfers: Sit to/from Stand Sit to Stand: Max assist;From elevated surface         General transfer comment: Max assist to support trunk over flexed knees with right  leg more flexed and buckling than left leg.  Pt unable to take steps forward or sideways.  Therapist tried and then her daughter tried as well and reported this is much more assist than her baseline.   Ambulation/Gait             General Gait Details: unable to walk even assisted at this time.            Balance Overall balance assessment: Needs assistance Sitting-balance support: Feet supported;Bilateral upper extremity supported Sitting balance-Leahy Scale: Zero Sitting balance - Comments: max assist at trunk initially due to significant posterior lean in sitting, better, once EOB, but knees need to be blocked and trunk moderate assist to prevent sliding off of the EOB.  Postural control: Posterior lean Standing balance support: Bilateral upper extremity supported Standing balance-Leahy Scale: Zero Standing balance comment: max assist to stand over weak legs. pt holding onto person helping (PT and then daughter), but unable to support her body over her feet.                               Pertinent Vitals/Pain Pain Assessment: Faces Faces Pain Scale: Hurts even more Pain Location: abomen with transitions Pain Descriptors / Indicators: Aching;Burning;Guarding;Grimacing Pain Intervention(s): Limited activity within patient's tolerance;Monitored during session;Repositioned    Home Living Family/patient expects to be discharged to:: Private residence Living Arrangements: Spouse/significant other;Children;Other relatives Available Help at Discharge: Available 24 hours/day Type of Home: Mobile home Home Access: Ramped  entrance     Home Layout: One level Home Equipment: [place] - 2 wheels;Bedside commode;Cane - single point;Wheelchair - Sport and exercise psychologist Comments: Per daughter and son's report, there is always someone there who can physically handle her and they are comfortable with standing her to get in the [facility] to get her around the home.     Prior  Function Level of Independence: Needs assistance   Gait / Transfers Assistance Needed: hand held assist to walk, using WC more often           Hand Dominance   Dominant Hand: Right    Extremity/Trunk Assessment   Upper Extremity Assessment Upper Extremity Assessment: Generalized weakness    Lower Extremity Assessment Lower Extremity Assessment: RLE deficits/detail;LLE deficits/detail RLE Deficits / Details: bil legs are weak with visible atrophy, right leg is tighter with less than full knee extension by ~15 degrees.  She is unable to move her legs to EOB and unable to support herself in standing.  She is difficult to formally MMT because of her confusion.  Translator was present and assisting.  RLE Sensation: history of peripheral neuropathy LLE Deficits / Details: left leg has better ROM and seems stronger than her right leg.  She bore more weight through her left leg in standing than her right and can extend the knee fully in the bed.  She did not demonstrate the ability to move it or lift it against gravity.  LLE Sensation: history of peripheral neuropathy    Cervical / Trunk Assessment Cervical / Trunk Assessment: Kyphotic  Communication   Communication: Prefers language other than [place];Other (comment) (Spanish)  Cognition Arousal/Alertness: Awake/alert Behavior During Therapy: WFL for tasks assessed/performed Overall Cognitive Status: Impaired/Different from baseline Area of Impairment: Orientation;Memory;Attention;Following commands;Awareness;Safety/judgement;Problem solving                 Orientation Level: Disoriented to;Place;Time;Situation Current Attention Level: Sustained Memory: Decreased recall of precautions;Decreased short-term memory Following Commands: Follows one step commands inconsistently Safety/Judgement: Decreased awareness of safety;Decreased awareness of deficits Awareness: Intellectual Problem Solving: Difficulty sequencing;Requires  verbal cues;Requires tactile cues General Comments: Pt cannot see, is tangental in her thoughts, and does not know where she is or why.  Her daughter reports confusion is baseline, but this is worse, her son reports this is her normal level of confusion.       General Comments General comments (skin integrity, edema, etc.): Pt incontinent of stool.         Assessment/Plan    PT Assessment Patient needs continued PT services  PT Problem List Decreased strength;Decreased range of motion;Decreased activity tolerance;Decreased balance;Decreased mobility;Decreased cognition;Decreased knowledge of use of DME;Decreased safety awareness;Decreased knowledge of precautions;Obesity;Pain;Decreased skin integrity       PT Treatment Interventions DME instruction;Gait training;Functional mobility training;Therapeutic activities;Therapeutic exercise;Balance training;Cognitive remediation;Patient/family education    PT Goals (Current goals can be found in the Care Plan section)  Acute Rehab PT Goals Patient Stated Goal: pt unable to state, family wants her home PT Goal Formulation: With family Time For Goal Achievement: 12/18/16 Potential to Achieve Goals: Fair    Frequency Min 3X/week           AM-PAC PT "6 Clicks" Daily Activity  Outcome Measure Difficulty turning over in bed (including adjusting bedclothes, sheets and blankets)?: Total Difficulty moving from lying on back to sitting on the side of the bed? : Total Difficulty sitting down on and standing up from a chair with arms (e.g., wheelchair, bedside commode, etc,.)?: Total Help needed  moving to and from a bed to chair (including a wheelchair)?: A Lot Help needed walking in hospital room?: Total Help needed climbing 3-5 steps with a railing? : Total 6 Click Score: 7    End of Session   Activity Tolerance: Patient limited by fatigue;Other (comment) (limited by weakness and confusion. ) Patient left: in bed;with call bell/phone  within reach;with bed alarm set Nurse Communication: Mobility status PT Visit Diagnosis: Muscle weakness (generalized) (M62.81);Difficulty in walking, not elsewhere classified (R26.2);Pain Pain - part of body:  (abdomen)    Time: 1505-6979 PT Time Calculation (min) (ACUTE ONLY): 39 min   Charges:         Wells Guiles B. Chene Kasinger, PT, DPT [phone]   PT Evaluation $PT Eval Moderate Complexity: 1 Procedure PT Treatments $Therapeutic Activity: 23-37 mins   12/04/2016, 5:47 PM

## 2016-12-04 NOTE — Progress Notes (Signed)
Report called to 6E. Pt is currently working with physical therapy. Will transport patient after therapy is completed.

## 2016-12-04 NOTE — Progress Notes (Signed)
Notified MD of pt's blood pressures being elevated. MD reports they are aware and will adjust medications. Will continue to monitor.

## 2016-12-04 NOTE — Progress Notes (Signed)
   Subjective: Sarah Kelley is alert and communicating today. Initially when seen early this morning she was disoriented to place and time and her granddaughter explained that she had not slept all night. When seen during rounds she is sitting up in the bed watching tv rambling to son. Her son is at bedside and explains that her level of AMS is at baseline. She describes full body aches but says that her teeth hurt the worse. She says overall she is feeling much better than the past two days.   Objective:  Vital signs in last 24 hours: Vitals:   12/04/16 0734 12/04/16 1157 12/04/16 1422 12/04/16 1521  BP: (!) 153/85 (!) 170/70 (!) 166/62 (!) 166/62  Pulse: 79 80 83 84  Resp: 15 20 18  (!) 21  Temp: 97.4 F (36.3 C) 98.4 F (36.9 C)  97.5 F (36.4 C)  TempSrc: Oral Oral  Oral  SpO2: 99% 97% 98% 100%  Weight:      Height:       Physical Exam  Constitutional: No distress.  Appears older than stated age   HENT:  Head: Normocephalic and atraumatic.  Poor dentition, no obvious dental abscesses or ulcers but she is wearing dentures.   Eyes: Conjunctivae are normal. Right eye exhibits no discharge. Left eye exhibits no discharge. No scleral icterus.  Cardiovascular: Normal rate and regular rhythm.   No murmur heard. Pulmonary/Chest: Effort normal. No respiratory distress.  Abdominal: Soft. Bowel sounds are normal. She exhibits no distension. There is no tenderness. There is no guarding.  Neurological:  More alert than yesterday ( knows name, hospital, but says that we are in [place] and doesn't know the year) son says this is at baseline   Skin: Skin is warm and dry. She is not diaphoretic.  Small skin tear on left buttocks, tenderness without surrounding erythema. No other skin breakdown on full body skin check.    Assessment/Plan:  Principal Problem:   Severe sepsis (HCC) Active Problems:   ESRD (end stage renal disease) (HCC)   Hyperglycemia   Oropharyngeal  dysphagia   UTI (urinary tract infection)   Sepsis secondary to urinary tract infection  Urine cultures returned with multiple species and suggested recollection however she has already received antibiotics. Clinically she is much more alert and responsive and is feeling better today. Tmax over the past 24 hours 100.4. She seems to be recovering well from urosepsis, she has had negative MRSA swab and blood cultures are no growth to date so we will narrow antibiotics today to cefdinir today. Unfortunately, she was not able to tolerate orthopantogram.  - zosyn 6/2 > 6/4, vanc 6/2 > 6/3 - ordered cefdinir with stop date 6/8 -follow up blood cultures > NGTD   - limit fluids in this HD patient  -tylenol PRN   Hypertension   Hypertensive today, home medication valsartan 160mg .  -ordered irbesartan 150 mg daily   ESRD on T/Th/Sa HD  - nephrology is following, we appreciate their advice   Hyperglycemia  This morning glucose was 68, improved to 122 after a snack then 358 after breakfast. Home med is detemir 10 qHS and lantus 8-10 units BID.   - continue ISS sensitive  -decrease detemir to 5 qHS  - continue CBG monitoring   Dispo: Anticipated discharge in approximately 1-2 day(s).   Ledell Noss, MD 12/04/2016, 3:44 PM Pager: [phone]

## 2016-12-05 DIAGNOSIS — Z992 Dependence on renal dialysis: Secondary | ICD-10-CM

## 2016-12-05 DIAGNOSIS — Z79899 Other long term (current) drug therapy: Secondary | ICD-10-CM

## 2016-12-05 DIAGNOSIS — N390 Urinary tract infection, site not specified: Secondary | ICD-10-CM

## 2016-12-05 DIAGNOSIS — R739 Hyperglycemia, unspecified: Secondary | ICD-10-CM

## 2016-12-05 DIAGNOSIS — I120 Hypertensive chronic kidney disease with stage 5 chronic kidney disease or end stage renal disease: Secondary | ICD-10-CM

## 2016-12-05 DIAGNOSIS — A419 Sepsis, unspecified organism: Principal | ICD-10-CM

## 2016-12-05 LAB — BASIC METABOLIC PANEL
ANION GAP: 14 (ref 5–15)
BUN: 52 mg/dL — AB (ref 6–20)
CHLORIDE: 88 mmol/L — AB (ref 101–111)
CO2: 24 mmol/L (ref 22–32)
Calcium: 8.2 mg/dL — ABNORMAL LOW (ref 8.9–10.3)
Creatinine, Ser: 6.54 mg/dL — ABNORMAL HIGH (ref 0.44–1.00)
GFR calc Af Amer: 7 mL/min — ABNORMAL LOW (ref >60)
GFR, EST NON AFRICAN AMERICAN: 6 mL/min — AB (ref >60)
GLUCOSE: 161 mg/dL — AB (ref 65–99)
POTASSIUM: 3.9 mmol/L (ref 3.5–5.1)
Sodium: 126 mmol/L — ABNORMAL LOW (ref 135–145)

## 2016-12-05 LAB — GLUCOSE, CAPILLARY
Glucose-Capillary: 367 mg/dL — ABNORMAL HIGH (ref 65–99)
Glucose-Capillary: 263 mg/dL — ABNORMAL HIGH (ref 65–99)
Glucose-Capillary: 105 mg/dL — ABNORMAL HIGH (ref 65–99)
Glucose-Capillary: 215 mg/dL — ABNORMAL HIGH (ref 65–99)
Glucose-Capillary: 222 mg/dL — ABNORMAL HIGH (ref 65–99)

## 2016-12-05 MED ORDER — HEPARIN SODIUM (PORCINE) 1000 UNIT/ML DIALYSIS: 20.0000 [IU]/kg | INTRAMUSCULAR | Status: DC | PRN

## 2016-12-05 MED ORDER — INSULIN DETEMIR 100 UNIT/ML ~~LOC~~ SOLN
10.0000 [IU] | Freq: Every day | SUBCUTANEOUS | Status: DC
  Administered 2016-12-05: 10 [IU] via SUBCUTANEOUS
  Filled 2016-12-05 (×2): qty 0.1

## 2016-12-05 MED ORDER — INSULIN ASPART 100 UNIT/ML ~~LOC~~ SOLN
9.0000 [IU] | Freq: Once | SUBCUTANEOUS | Status: AC
  Administered 2016-12-05: 9 [IU] via SUBCUTANEOUS

## 2016-12-05 MED ORDER — DARBEPOETIN ALFA 60 MCG/0.3ML IJ SOSY
PREFILLED_SYRINGE | INTRAMUSCULAR | Status: AC
  Filled 2016-12-05: qty 0.3

## 2016-12-05 MED ORDER — DIPHENHYDRAMINE HCL 25 MG PO CAPS
25.0000 mg | ORAL_CAPSULE | Freq: Every evening | ORAL | Status: DC | PRN
  Administered 2016-12-05: 25 mg via ORAL
  Filled 2016-12-05: qty 1

## 2016-12-05 NOTE — Progress Notes (Signed)
When asked, patient refused to get out of bed to chair. Patent educated. Will continue to encourage.

## 2016-12-05 NOTE — Progress Notes (Signed)
Inpatient Diabetes Program Recommendations  AACE/ADA: New Consensus Statement on Inpatient Glycemic Control (2015)  Target Ranges:  Prepandial:   less than 140 mg/dL      Peak postprandial:   less than 180 mg/dL (1-2 hours)      Critically ill patients:  140 - 180 mg/dL   Results for ADIANNA, DARWIN (MRN [id number]) as of 12/05/2016 15:05  Ref. Range 12/04/2016 07:33 12/04/2016 08:05 12/04/2016 08:42 12/04/2016 11:56 12/04/2016 17:13 12/04/2016 17:39 12/04/2016 21:51 12/04/2016 23:49 12/05/2016 01:05 12/05/2016 02:34 12/05/2016 12:02  Glucose-Capillary Latest Ref Range: 65 - 99 mg/dL 68 67 122 (H) 358 (H) 362 (H) 366 (H) 350 (H) 350 (H) 367 (H) 263 (H) 105 (H)   Review of Glycemic Control  Diabetes history: DM Outpatient Diabetes medications: Lantus 8-10 units BID Current orders for Inpatient glycemic control: Levemir 10 units QHS, Novolog 0-9 units TID with meals  Inpatient Diabetes Program Recommendations:  Insulin - Basal: Noted Levemir was increased from 8 units QHS to 10 units QHS today. Correction (SSI): Please consider ordering Novolog 0-5 units QHS for bedtime correction scale. Insulin - Meal Coverage: If patient is eating at least 50% of meals, please consider ordering Novolog 4 units TID with meals (in addition to Novolog correction scale).  Thanks, Barnie Alderman, RN, MSN, CDE Diabetes Coordinator Inpatient Diabetes Program [phone] (Team Pager from 8am to 5pm)

## 2016-12-05 NOTE — Procedures (Signed)
I was present at this dialysis session. I have reviewed the session itself and made appropriate changes.  Having some confusion this AM and developed mild hypotension at beginning of HD. Will reduce UF goal.  4K bath. AVF.  Has mild hyponatremia, make sure to limit all fluids to about 1.5L/d max.    Filed Weights   12/02/16 1432 12/02/16 2110  Weight: 69 kg (152 lb 1.9 oz) 59.4 kg (130 lb 15.3 oz)     Recent Labs Lab 12/02/16 2140  12/05/16 0546  NA 135  < > 126*  K 3.8  < > 3.9  CL 95*  < > 88*  CO2 25  < > 24  GLUCOSE 240*  < > 161*  BUN 24*  < > 52*  CREATININE 3.85*  < > 6.54*  CALCIUM 8.6*  < > 8.2*  PHOS 2.0*  --   --   < > = values in this interval not displayed.   Recent Labs Lab 12/02/16 1237 12/03/16 0526 12/04/16 0337  WBC 23.8* 22.1* 18.8*  NEUTROABS 21.7* 19.5*  --   HGB 12.4 10.4* 10.9*  HCT 39.1 33.0* 34.6*  MCV 92.2 89.7 89.4  PLT 274 228 236    Scheduled Meds: . cefdinir  300 mg Oral Daily  . darbepoetin (ARANESP) injection - DIALYSIS  60 mcg Intravenous Q Tue-HD  . heparin  5,000 Units Subcutaneous Q8H  . insulin aspart  0-9 Units Subcutaneous TID WC  . insulin detemir  8 Units Subcutaneous QHS  . irbesartan  150 mg Oral Daily  . multivitamin  1 tablet Oral QHS  . olopatadine  1 drop Both Eyes BID   Continuous Infusions: PRN Meds:.acetaminophen, acetaminophen, alteplase, diphenhydrAMINE, heparin, lidocaine (PF), lidocaine-prilocaine, pentafluoroprop-tetrafluoroeth   Pearson Grippe  MD 12/05/2016, 8:05 AM

## 2016-12-05 NOTE — Progress Notes (Signed)
   Subjective: Ms. Jonny Ruiz is alert and communicating today. She is disoriented to place when seen in the HD unit this morning. She is lying in bed watching TV and ask many good questions about being hungry and wishing she had a meal tray delivered this morning and being kept in the bed.  She denies new concerns or complaints.   Objective:  Vital signs in last 24 hours: Vitals:   12/05/16 0900 12/05/16 0930 12/05/16 1000 12/05/16 1030  BP: (!) 105/57 (!) 111/57 (!) 123/59 (!) 117/57  Pulse: 84 75 75 79  Resp:   16   Temp:      TempSrc:      SpO2:      Weight:      Height:       Physical Exam  Constitutional: No distress.  Appears older than stated age   HENT:  Head: Normocephalic and atraumatic.  Poor dentition, no obvious dental abscesses or ulcers but she is wearing dentures.   Eyes: Conjunctivae are normal. Right eye exhibits no discharge. Left eye exhibits no discharge. No scleral icterus.  Cardiovascular: Normal rate and regular rhythm.   No murmur heard. Pulmonary/Chest: Effort normal. No respiratory distress.  Abdominal: Soft. Bowel sounds are normal. She exhibits no distension. There is no tenderness. There is no guarding.  Right lower quadrant abdominal tenderness   Neurological:  More alert than yesterday ( knows name, hospital, but says that we are in [place] and doesn't know the year) son says this is at baseline   Skin: Skin is warm and dry. She is not diaphoretic.   Assessment/Plan:  Principal Problem:   Severe sepsis (HCC) Active Problems:   ESRD (end stage renal disease) (HCC)   Hyperglycemia   Oropharyngeal dysphagia   UTI (urinary tract infection)   Sepsis secondary to urinary tract infection  Today she remains disoriented but seems to have a clearer mental function when her son is around and he feels that she is back at her baseline which has been altered since her admission in January for sepsis pneumonia. She has been afebrile overnight  and continues to recover well from the urosepsis. - zosyn 6/2 > 6/4 -vanc 6/2 > 6/3 - ordered cefdinir 6/4 > with stop date 6/8 -follow up blood cultures > NGTD   -tylenol PRN  - PT evaluation and treatment   Hypertension   Hypertensive today, home medication valsartan 160mg .  -continue irbesartan 150 mg daily   ESRD on T/Th/Sa HD  Going for HD today.  - nephrology is following, we appreciate their advice   Hyperglycemia  Home med is detemir 10 qHS and lantus 8-10 units BID.   - continue ISS sensitive  - increase detemir to 10 qHS  - continue CBG monitoring   Dispo: Anticipated discharge in approximately 1-2 day(s).   Ledell Noss, MD 12/05/2016, 11:22 AM Pager: [phone]

## 2016-12-06 DIAGNOSIS — R4182 Altered mental status, unspecified: Secondary | ICD-10-CM

## 2016-12-06 DIAGNOSIS — N186 End stage renal disease: Secondary | ICD-10-CM

## 2016-12-06 DIAGNOSIS — Z7189 Other specified counseling: Secondary | ICD-10-CM

## 2016-12-06 DIAGNOSIS — R404 Transient alteration of awareness: Secondary | ICD-10-CM

## 2016-12-06 LAB — BASIC METABOLIC PANEL
ANION GAP: 13 (ref 5–15)
BUN: 21 mg/dL — ABNORMAL HIGH (ref 6–20)
CHLORIDE: 95 mmol/L — AB (ref 101–111)
CO2: 26 mmol/L (ref 22–32)
Calcium: 8.5 mg/dL — ABNORMAL LOW (ref 8.9–10.3)
Creatinine, Ser: 4.34 mg/dL — ABNORMAL HIGH (ref 0.44–1.00)
GFR calc non Af Amer: 10 mL/min — ABNORMAL LOW (ref >60)
GFR, EST AFRICAN AMERICAN: 11 mL/min — AB (ref >60)
Glucose, Bld: 122 mg/dL — ABNORMAL HIGH (ref 65–99)
POTASSIUM: 3.6 mmol/L (ref 3.5–5.1)
SODIUM: 134 mmol/L — AB (ref 135–145)

## 2016-12-06 LAB — GLUCOSE, CAPILLARY
Glucose-Capillary: 98 mg/dL (ref 65–99)
Glucose-Capillary: 184 mg/dL — ABNORMAL HIGH (ref 65–99)

## 2016-12-06 MED ORDER — INSULIN ASPART 100 UNIT/ML ~~LOC~~ SOLN
0.0000 [IU] | Freq: Three times a day (TID) | SUBCUTANEOUS | Status: DC
  Administered 2016-12-06: 3 [IU] via SUBCUTANEOUS

## 2016-12-06 MED ORDER — INSULIN ASPART 100 UNIT/ML ~~LOC~~ SOLN: 0.0000 [IU] | Freq: Every day | SUBCUTANEOUS | Status: DC

## 2016-12-06 MED ORDER — CEFDINIR 300 MG PO CAPS: ORAL_CAPSULE | ORAL | 0 refills | Status: DC

## 2016-12-06 MED ORDER — IRBESARTAN 75 MG PO TABS: 150.0000 mg | ORAL_TABLET | Freq: Every day | ORAL | Status: DC

## 2016-12-06 MED ORDER — CETIRIZINE HCL 10 MG PO TABS: 5.0000 mg | ORAL_TABLET | Freq: Every day | ORAL | 5 refills | Status: DC

## 2016-12-06 MED FILL — CEFDINIR 300 MG CAPSULE: 300 | 2 days supply | Qty: 2 | Fill #0

## 2016-12-06 NOTE — Consult Note (Signed)
Consultation Note Date: 12/06/2016   Patient Name: Sarah Kelley  DOB: Feb 12, 1951  MRN: [id number]  Age / Sex: 66 y.o., female  PCP: Arnoldo Morale, MD Referring Physician: Lucious Groves, DO  Reason for Consultation: Disposition, Establishing goals of care and Psychosocial/spiritual support  HPI/Patient Profile: 66 y.o. female  with past medical history of ESRD on dialysis (started Jan 2018), depression, HTN, IDDM, and chronic urinary incontinence. Since her admission in January (for penumonia and when dialysis was initiated) she has declined both physically and mentally and has required increased care. Family has needed to attend every dialysis session due to confusion and need to keep her from moving during the session (both in the hospital and previously outpatient). She presented to the ED from home after her family found her unresponsive, and she was admitted on 12/02/2016 with sepsis secondary to a UTI. Sepsis has resolved. Palliative consulted to assist in discussing goals of care in light of her seemingly chronic mental status changes and the challenges it poses to dialysis.   Clinical Assessment and Goals of Care: I met Sarah Kelley at her bedside. She is too confused to meaningfully participate in a goals of care conversation. Her family friend, Leatrice Jewels, was present at her bedside. Leatrice Jewels is one of her primary caregivers and is very close with the family. She and I had a long conversation about the information she received today, specifically the option for discontinuing dialysis. She had a good understanding of why this option was brought-up. She felt like Sarah Kelley was suffering, and understood dialysis would not improve her symptoms, but only prolong the time she had to experience them. She also understood that stopping dialysis would result in Sarah Kelley death, most likely within a few weeks Sarah Kelley is reportedly still making some urine, although minimal). The  decision on dialysis is ultimately up to Sarah Kelley's husband, although he strongly relies on their daughter Lenell Antu to help in decision making. They could not be here today, but were willing to meet with me tomorrow, with Leatrice Jewels present as well.   Unfortunately, I will not have a chance to meet with them as Sarah Kelley is being discharged home today. I encouraged Leatrice Jewels to continue talking with her family about the option of stopping dialysis, and I will reach out to her primary Nephrologist (Dr. Marval Regal) to follow-up on this as an outpatient. I did not have a chance to discuss code status.  Primary Decision Maker NEXT OF KIN    SUMMARY OF RECOMMENDATIONS   -Pt being discharged home today so unable to have family meeting. I will in-basket message Dr. Marval Regal to make him aware of the need for follow-up conversations when family is present  Code Status/Advance Care Planning:  Full code; I was not able to address during our meeting  Additional Recommendations (Limitations, Scope, Preferences):  Full Scope Treatment  Psycho-social/Spiritual:   Desire for further Chaplaincy support:no  Additional Recommendations: Referral to Community Resources   Prognosis:   Unable to determine  Discharge Planning: Home      Primary Diagnoses: Present on Admission: . Severe sepsis ([place]) . ESRD (end stage renal disease) ([place]) . Oropharyngeal dysphagia . UTI (urinary tract infection)   I have reviewed the medical record, interviewed the patient and family, and examined the patient. The following aspects are pertinent.  Past Medical History:  Diagnosis Date  . Diabetes mellitus   . Hyperlipidemia   . Hypertension   . Neuropathy    Diabetic  . Pneumonia 07/2016  . Renal disorder   .  Thyroid disease    Social History   Social History  . Marital status: Married    Spouse name: N/A  . Number of children: N/A  . Years of education: N/A   Social History Main Topics  . Smoking status: Never  Smoker  . Smokeless tobacco: Never Used  . Alcohol use No     Comment: rare- a beer every now and then  . Drug use: No  . Sexual activity: Not Currently   Other Topics Concern  . None   Social History Narrative  . None   No family history on file. Scheduled Meds: . cefdinir  300 mg Oral Daily  . darbepoetin (ARANESP) injection - DIALYSIS  60 mcg Intravenous Q Tue-HD  . heparin  5,000 Units Subcutaneous Q8H  . insulin aspart  0-15 Units Subcutaneous TID WC  . insulin aspart  0-5 Units Subcutaneous QHS  . insulin detemir  10 Units Subcutaneous QHS  . [START ON 12/07/2016] irbesartan  150 mg Oral QHS  . multivitamin  1 tablet Oral QHS  . olopatadine  1 drop Both Eyes BID   Continuous Infusions: PRN Meds:.acetaminophen, acetaminophen, alteplase, diphenhydrAMINE, heparin, lidocaine (PF), lidocaine-prilocaine, pentafluoroprop-tetrafluoroeth Allergies  Allergen Reactions  . No Known Allergies    Review of Systems: Unable to obtain, pt confused.  Physical Exam  Constitutional: No distress.  HENT:  Head: Normocephalic and atraumatic.  Mouth/Throat: Oropharynx is clear and moist. No oropharyngeal exudate.  Eyes: EOM are normal.  Likely visual disturbance, squinting to see me and pulling paper close to read it  Neck: Normal range of motion. Neck supple.  Cardiovascular: Normal rate and regular rhythm.   Pulmonary/Chest: Effort normal and breath sounds normal. No respiratory distress.  Abdominal: Soft. Bowel sounds are normal.  Musculoskeletal: Normal range of motion.  Generalized weakness, significant muscle wasting  Neurological: She is alert.  Oriented to self only.  Skin: Skin is warm and dry.  Psychiatric: She has a normal mood and affect. Her speech is rapid and/or pressured and tangential. She is hyperactive. Cognition and memory are impaired. She expresses impulsivity and inappropriate judgment.    Vital Signs: BP (!) 150/43 (BP Location: Right Arm)   Pulse 80   Temp  98.5 F (36.9 C) (Oral)   Resp 16   Ht 5' 2"  (1.575 m)   Wt 60 kg (132 lb 4.4 oz)   LMP  (LMP Unknown)   SpO2 98%   BMI 24.19 kg/m  Pain Assessment: No/denies pain   Pain Score: 0-No pain   SpO2: SpO2: 98 % O2 Device:SpO2: 98 % O2 Flow Rate: .   IO: Intake/output summary:  Intake/Output Summary (Last 24 hours) at 12/06/16 1409 Last data filed at 12/06/16 1402  Gross per 24 hour  Intake              480 ml  Output                0 ml  Net              480 ml    LBM: Last BM Date: 12/06/16 Baseline Weight: Weight: 69 kg (152 lb 1.9 oz) Most recent weight: Weight: 60 kg (132 lb 4.4 oz)     Palliative Assessment/Data: PPS 40-50%    Time Total: 50 minutes Greater than 50%  of this time was spent counseling and coordinating care related to the above assessment and plan.  Signed by: Charlynn Court, NP Palliative Medicine Team Pager # [phone] (M-F  7a-5p) Team Phone # [phone] (Nights/Weekends)

## 2016-12-06 NOTE — Care Management Note (Signed)
Case Management Note  Patient Details  Name: Sarah Kelley MRN: [id number] Date of Birth: [birth year]  Subjective/Objective:       Continuing to follow for progression and d/c planning.              Action/Plan: Noted plan to d/c this pt to home today as she will be unable to d/c to a SNF and per the family friend the family wishes to take the pt home, where they will provide ongoing care and transfer to HD. Pall met with pt friend who helps to provide care for the pt and introducted the possibility of home hospice for ongoing care and the family consideration.   Expected Discharge Date:    12/06/2016            Expected Discharge Plan:  Home/Self Care  In-House Referral:  Clinical Social Work  Discharge planning 06/06/2018Services  CM Consult  Post Acute Care Choice:  NA Choice offered to:  NA  DME Arranged:  N/A DME Agency:  NA  HH Arranged:  NA HH Agency:  NA  Status of Service:  Completed, signed off  If discussed at [place] of Stay Meetings, dates discussed:    Additional Comments:  Adron Bene, RN 12/06/2016, 3:43 PM

## 2016-12-06 NOTE — Discharge Instructions (Signed)
[place],   Fue un placer de tratar tus condiciones medicas durante su visita en el hospital. [place] cefdinir (tu antibiotico) para el [employer] dias (termine 6/8). Por favor llame a tu doctor para hacer un cita despues de salir el hospital.

## 2016-12-06 NOTE — Progress Notes (Signed)
   Subjective: [name] is alert and communicating today, she is confused but knows that she is in [place]. She is lying in bed watching TV with a family friend at bedside. She denies new concerns or complaints. I spoke with her husband this morning about whether the family would prefer SNF placement which is what PT recommended or discharge to home. He is in agreement that she would benefit from a short stay at [facility]. She has had difficulty tolerating dialysis here in the hospital given her changed mental status, outside of the hospital her family has needed to be present to hold her and keep her from moving her arm during sessions.   Objective:  Vital signs in last 24 hours: Vitals:   12/05/16 1709 12/05/16 2042 12/06/16 0418 12/06/16 1038  BP: (!) 150/51 (!) 165/54 (!) 136/57 (!) 150/43  Pulse: 68 77 85 80  Resp: 16 17  16   Temp: 97.6 F (36.4 C) 97.8 F (36.6 C) 99.5 F (37.5 C) 98.5 F (36.9 C)  TempSrc: Oral Oral Axillary Oral  SpO2: 99% 98% 97% 98%  Weight:  132 lb 4.4 oz (60 kg)    Height:       Physical Exam  Constitutional: No distress.  Appears older than stated age   HENT:  Head: Normocephalic and atraumatic.  She is wearing dentures   Eyes: Conjunctivae are normal. Right eye exhibits no discharge. Left eye exhibits no discharge. No scleral icterus.  Cardiovascular: Normal rate and regular rhythm.   No murmur heard. Pulmonary/Chest: Effort normal. No respiratory distress.  Abdominal: Soft. Bowel sounds are normal. She exhibits no distension. There is no tenderness. There is no guarding.  Right lower quadrant abdominal tenderness   Neurological:  More alert than yesterday ( knows name, hospital, doesn't know the year)   Skin: Skin is warm and dry. She is not diaphoretic.   Assessment/Plan:  Principal Problem:   Severe sepsis (HCC) Active Problems:   ESRD (end stage renal disease) ([place])   Hyperglycemia   Oropharyngeal dysphagia   UTI (urinary  tract infection)   Sepsis secondary to urinary tract infection  Sepsis has resolved. Remains at her baseline mental status which is much worse than where she was prior to January. PT has recommended SNF placement, she is medically stable for discharge and her husband is in agreement with this. Will attempt to have palliative meet with her family while we await SNF placement.  - zosyn 6/2 > 6/4 -vanc 6/2 > 6/3 -  cefdinir 6/4 >  stop date 6/8 -follow up blood cultures > NGTD   -tylenol PRN   Hypertension   Normotensive today, home medication valsartan 160mg .  -continue irbesartan 150 mg daily   ESRD on T/Th/Sa HD  Went for HD yesterday.  - nephrology is following, we appreciate their advice   Hyperglycemia  Home med is detemir 10 qHS and lantus 8-10 units BID.   - increased ISS to moderate  - continue detemir to 10 qHS  - continue CBG monitoring   Dispo: Anticipated discharge once she has SNF placement   Ledell Noss, MD 12/06/2016, 11:28 AM Pager: [phone]

## 2016-12-06 NOTE — Care Management Note (Addendum)
Case Management Note  Patient Details  Name: Karthika Glasper MRN: [id number] Date of Birth: 02-13-1951  Subjective/Objective:      CM following for progression and d/c planning.               Action/Plan: 12/06/16 Discussed pt this am, questions re how pt is traveling to HD, per family they transport pt and lift into HD chair , per MD note the family stays with the pt during HD in order for her to hold her arm still and tol the treatment.  This CM upon record review noted that MD had discussed SNF placement of this pt in SNF with the husband. This pt has no coverage for SNF placement , no SS# to obtain a PASARR and would not receive PT at a SNF even if we were able to place her. This CM notified pt MD, Dr Hetty Ely, who was unaware of pt resources and eligibility issues.   Discussed with CSW Crawford Givens , will attempt to explain this to the family with the assistance of the interpreter.   Expected Discharge Date:                  Expected Discharge Plan:  Home/Self Care  In-House Referral:  Clinical Social Work  Discharge planning Services  CM Consult  Post Acute Care Choice:  NA Choice offered to:  NA  DME Arranged:  N/A DME Agency:  NA  HH Arranged:  NA HH Agency:  NA  Status of Service:  Completed, signed off  If discussed at [place] of Stay Meetings, dates discussed:    Additional Comments:  Adron Bene, RN 12/06/2016, 2:28 PM

## 2016-12-06 NOTE — Progress Notes (Signed)
Physical Therapy Treatment Patient Details Name: Sarah Kelley MRN: [id number] DOB: 1950-08-01 Today's Date: 12/06/2016    History of Present Illness 66 y.o. female admitted to [facility] on 12/02/16 for increased confusion, weakness, ultimately dx with sepsis due to UTI.  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     PT Comments    Pt not resistive with mobility but unable to help any significant amount. Max A +2 for bed mobility and pivot to chair. Pt still with nonsensical speech per interpreter. PT will continue to follow.    Follow Up Recommendations  No PT follow up;Supervision/Assistance - 24 hour     Equipment Recommendations  None recommended by PT    Recommendations for Other Services       Precautions / Restrictions Precautions Precautions: Fall Restrictions Weight Bearing Restrictions: No    Mobility  Bed Mobility Overal bed mobility: Needs Assistance Bed Mobility: Supine to Sit     Supine to sit: Max assist;HOB elevated     General bed mobility comments: despite vc's, pt did not initiate movement to EOB. Max A for B LE's off EOB and max A +2 for elevation of trunk to sitting. Pt initially with strong posterior lean but once positioned closer to EOB, was able to maintain sitting with min-guard  Transfers Overall transfer level: Needs assistance Equipment used: None Transfers: Sit to/from [employer] Transfers Sit to Stand: +2 physical assistance;Max assist Stand pivot transfers: Max assist;+2 physical assistance       General transfer comment: pt took small steps to chair with Max A +2 support  Ambulation/Gait             General Gait Details: unable   Stairs            Wheelchair Mobility    Modified Rankin (Stroke Patients Only)       Balance Overall balance assessment: Needs assistance Sitting-balance support: Feet  supported;Bilateral upper extremity supported Sitting balance-Leahy Scale: Poor Sitting balance - Comments: began max A but progressed to min-guard Postural control: Posterior lean Standing balance support: Bilateral upper extremity supported Standing balance-Leahy Scale: Zero Standing balance comment: fwd flexion in standing, max A +2                            Cognition Arousal/Alertness: Awake/alert Behavior During Therapy: WFL for tasks assessed/performed Overall Cognitive Status: Impaired/Different from baseline Area of Impairment: Orientation;Memory;Attention;Following commands;Awareness;Safety/judgement;Problem solving                 Orientation Level: Disoriented to;Place;Time;Situation Current Attention Level: Sustained Memory: Decreased recall of precautions;Decreased short-term memory Following Commands: Follows one step commands inconsistently Safety/Judgement: Decreased awareness of safety;Decreased awareness of deficits Awareness: Intellectual Problem Solving: Difficulty sequencing;Requires verbal cues;Requires tactile cues General Comments: Pt cannot see, is tangental in her thoughts, and does not know where she is or why.  Her daughter reports confusion is baseline, but this is worse, her son reports this is her normal level of confusion.       Exercises General Exercises - Lower Extremity Ankle Circles/Pumps: AROM;Both;15 reps;Seated Heel Slides: AROM;Both;10 reps;Seated    General Comments General comments (skin integrity, edema, etc.): per interpreter, pt talking about dresses and how she wants any dresses we can give her, especially green ones.       Pertinent Vitals/Pain Pain Assessment: Faces Faces Pain Scale: Hurts even  more Pain Location: LE's with movement Pain Descriptors / Indicators: Guarding;Grimacing Pain Intervention(s): Limited activity within patient's tolerance;Monitored during session    Home Living                       Prior Function            PT Goals (current goals can now be found in the care plan section) Acute Rehab PT Goals Patient Stated Goal: pt unable to state, family wants her home PT Goal Formulation: With family Time For Goal Achievement: 12/18/16 Potential to Achieve Goals: Fair Progress towards PT goals: Not progressing toward goals - comment (mental status)    Frequency    Min 3X/week      PT Plan Discharge plan needs to be updated    Co-evaluation              AM-PAC PT "6 Clicks" Daily Activity  Outcome Measure  Difficulty turning over in bed (including adjusting bedclothes, sheets and blankets)?: Total Difficulty moving from lying on back to sitting on the side of the bed? : Total Difficulty sitting down on and standing up from a chair with arms (e.g., wheelchair, bedside commode, etc,.)?: Total Help needed moving to and from a bed to chair (including a wheelchair)?: Total Help needed walking in hospital room?: Total Help needed climbing 3-5 steps with a railing? : Total 6 Click Score: 6    End of Session Equipment Utilized During Treatment: Gait belt Activity Tolerance: Patient limited by fatigue Patient left: in chair;with call bell/phone within reach;with chair alarm set;with family/visitor present Nurse Communication: Mobility status PT Visit Diagnosis: Muscle weakness (generalized) (M62.81);Difficulty in walking, not elsewhere classified (R26.2);Pain Pain - part of body: Leg     Time: 1110-1131 PT Time Calculation (min) (ACUTE ONLY): 21 min  Charges:  $Therapeutic Activity: 8-22 mins                    G Codes:       Leighton Roach, PT  Acute Rehab Services  [place] 12/06/2016, 3:21 PM

## 2016-12-06 NOTE — Progress Notes (Signed)
Discharge instructions, medications and follow up appointments discussed and reviewed with daughter at bedside,  Verbalized understanding. IV dc'ed, site clean and dry. Telemetry dc'ed, CCMD notified. Pt was escorted out of the unit in wheelchair accompanied by daughters and friend, took all belongings with them.

## 2016-12-06 NOTE — Progress Notes (Signed)
Subjective:  tolerated HD yest after goal decr mild hypotension at start of hd. Friend in room acts as Interpreter/ Pleasant confusion this am= About at Baseline/ NOTED  AT OP HD CENTER PT> REQUIRED  FAMILY AT [facility] HD because of confusion and  PT Puling hd needles out at op HD if left alone . Objective Vital signs in last 24 hours: Vitals:   12/05/16 1101 12/05/16 1709 12/05/16 2042 12/06/16 0418  BP: (!) 130/58 (!) 150/51 (!) 165/54 (!) 136/57  Pulse: 80 68 77 85  Resp: 18 16 17    Temp: 98 F (36.7 C) 97.6 F (36.4 C) 97.8 F (36.6 C) 99.5 F (37.5 C)  TempSrc: Oral Oral Oral Axillary  SpO2: 100% 99% 98% 97%  Weight: 60 kg (132 lb 4.4 oz)  60 kg (132 lb 4.4 oz)   Height:       Weight change:   Physical Exam:  BP= 136/57 / wt 60 kg No acute distress, awake, alert,baselind MS  confusion now  interactive  Heart= RRR, normal S1 and S2 Lungs= CTAB  Though somewhat diminshed in the bases  Extrem.=No pedal edema  Dialysis Access= Bruit and thrill present in left upper arm fistula   OP Dialysis Orders: [facility] T, Th, S 4 hrs 180 NRe 400/800 58.5 kg 3.0K/2.25 Ca -Heparin 2000 units IV TIW -Hectoral 1 mcg IV TIW  Problem/Plan: 1. ESRD THS [place] via LUE AVF- on schedule  Fu K   2. AMS; resolving 3. Likely UTI, on ; B Cx NGTD, U Cx mixed species per admit team =  now on cefdinir 6/4 > with stop date 6/8 4. Dementia/ Confusion  Baseline -noted above OP sitter on HD REQUIRED ( family has been at OP HD)/  Noted admit consulting Palliative care to dw  family decision making/GOC 5. HTN/vol - no change in EDW bp stable this am / on 150mg  Irbesartan qd change to hs to avoid low bp on hd 6. DM2- per admit  7. Anemia_  hgb 10.9  No esa  8. CKD BMD- admit phos  2.0 not on [place]/ vit d on hd ca corec 10.0 ( holding vit d on HD)  Ernest Haber, PA-C [place] [phone] 12/06/2016,9:38 AM  LOS: 4 days   Labs: Basic Metabolic Panel:  Recent Labs Lab  12/02/16 2140  12/04/16 0337 12/05/16 0546 12/06/16 0643  NA 135  < > 135 126* 134*  K 3.8  < > 4.5 3.9 3.6  CL 95*  < > 95* 88* 95*  CO2 25  < > 28 24 26   GLUCOSE 240*  < > 129* 161* 122*  BUN 24*  < > 42* 52* 21*  CREATININE 3.85*  < > 5.83* 6.54* 4.34*  CALCIUM 8.6*  < > 8.8* 8.2* 8.5*  PHOS 2.0*  --   --   --   --   < > = values in this interval not displayed. Liver Function Tests:  Recent Labs Lab 12/02/16 1408  AST 19  ALT 13*  ALKPHOS 154*  BILITOT 0.8  PROT 6.5  ALBUMIN 2.1*   No results for input(s): LIPASE, AMYLASE in the last 168 hours. No results for input(s): AMMONIA in the last 168 hours. CBC:  Recent Labs Lab 12/02/16 1237 12/03/16 0526 12/04/16 0337  WBC 23.8* 22.1* 18.8*  NEUTROABS 21.7* 19.5*  --   HGB 12.4 10.4* 10.9*  HCT 39.1 33.0* 34.6*  MCV 92.2 89.7 89.4  PLT 274 228 236  Cardiac Enzymes: No results for input(s): CKTOTAL, CKMB, CKMBINDEX, TROPONINI in the last 168 hours. CBG:  Recent Labs Lab 12/05/16 0234 12/05/16 1202 12/05/16 1650 12/05/16 2041 12/06/16 0802  GLUCAP 263* 105* 215* 222* 98    Studies/Results: No results found. Medications:  . cefdinir  300 mg Oral Daily  . darbepoetin (ARANESP) injection - DIALYSIS  60 mcg Intravenous Q Tue-HD  . heparin  5,000 Units Subcutaneous Q8H  . insulin aspart  0-15 Units Subcutaneous TID WC  . insulin aspart  0-5 Units Subcutaneous QHS  . insulin detemir  10 Units Subcutaneous QHS  . irbesartan  150 mg Oral Daily  . multivitamin  1 tablet Oral QHS  . olopatadine  1 drop Both Eyes BID

## 2016-12-07 LAB — CULTURE, BLOOD (ROUTINE X 2)
CULTURE: NO GROWTH
Culture: NO GROWTH
SPECIAL REQUESTS: ADEQUATE
Special Requests: ADEQUATE

## 2016-12-10 NOTE — Discharge Summary (Signed)
Name: Taytum Scheck MRN: [id number] DOB: [birth year] 66 y.o. PCP: Arnoldo Morale, MD  Date of Admission: 12/02/2016 12:13 PM Date of Discharge: 12/06/2016 Attending Physician: Dr. Joni Reining   Discharge Diagnosis: 1.Severe sepsis (HCC)   UTI (urinary tract infection)   ESRD (end stage renal disease) ([place])   Hyperglycemia   Goals of care, counseling/discussion   Discharge Medications: Allergies as of 12/06/2016      Reactions   No Known Allergies       Medication List    STOP taking these medications   acetaminophen 325 MG tablet Commonly known as:  TYLENOL     TAKE these medications   atorvastatin 40 MG tablet Commonly known as:  LIPITOR Take 1 tablet (40 mg total) by mouth daily.   calcium acetate 667 MG capsule Commonly known as:  PHOSLO Take 2 capsules (1,334 mg total) by mouth 3 (three) times daily with meals.   cefdinir 300 MG capsule Commonly known as:  OMNICEF Take one tablet ( 300 mg ) after dialysis on Tuesday 6/5 and Thursday 6/7   cetirizine 10 MG tablet Commonly known as:  ZYRTEC Take 0.5 tablets (5 mg total) by mouth daily. What changed:  how much to take   DULoxetine 60 MG capsule Commonly known as:  CYMBALTA Take 1 capsule (60 mg total) by mouth daily.   gabapentin 300 MG capsule Commonly known as:  NEURONTIN Take 600 mg by mouth at bedtime.   insulin detemir 100 UNIT/ML injection Commonly known as:  LEVEMIR Inject 0.1 mLs (10 Units total) into the skin at bedtime.   LANTUS 100 UNIT/ML injection Generic drug:  insulin glargine Inject 8-10 Units into the skin 2 (two) times daily. MORNING and NOON(TIME)   olopatadine 0.1 % ophthalmic solution Commonly known as:  PATANOL Place 1 drop into both eyes 2 (two) times daily.   valsartan 160 MG tablet Commonly known as:  DIOVAN Take 160 mg by mouth daily.       Disposition and follow-up:   Ms.Estefani Jonny Ruiz was discharged from [facility] in Stable  condition.  At the hospital follow up visit please address:  1. ESRD and goals of care counseling - assess how the patient has been tolerating dialysis and continue the goals of care discussion.   2.  Labs / imaging needed at time of follow-up: BMP, orthopantogram   3.  Pending labs/ test needing follow-up: none  Follow-up Appointments: Follow-up Information    Arnoldo Morale, MD Follow up in 1 week(s).   Specialty:  Family Medicine Contact information: [place] [address] [place] Hospital Course by problem list:    Severe sepsis [facility])   UTI (urinary tract infection)   Transient alteration of awareness PMH ESRD on dialysis, depression, HTN, and IDDM presented from home with altered mental status. She had become less responsive three days prior to admission, she was not speaking but was moving and her family believe she felt warm. She had decreased po intake, body aches, and foul smelling urine. She had been hospitalized with sepsis and pneumonia and had been started on HD 07/2016 and her family reported decline in her mental status and ability to perform ADLs since then. In the ED she was found to have WBC 23.6, lactic acid 2.78, temp 100.5, elevated glucose, bicarb of 19 and anion gap of 14, urinalysis revealed moderate leukocytes and rare bacteria and cultures grew too  numerous bacteria. She received two days of vanc and zosyn then was switched to cefdinir for a 1 week total course of antibiotics (stop date 6/8). The second day of admission she returned to baseline mental status which was found to be moderately disoriented.    ESRD (end stage renal disease) ([place])   Goals of care, counseling/discussion She had a difficult time tolerating dialysis due to her new baseline confusion. Her family and nephrology described that she was requiring supervision at dialysis to help hold her arm straight and keep her on the machine. Palliative care had the  chance to speak with her family prior to discharge and they expressed that they were considering taking her off of dialysis.     Hyperglycemia Caretakers had discontinued home insulin 3 days prior to admission when the patient became less responsive and wasn't eating. At presentation she was hyperglycemic with an anion gap. This improved with insulin drip and she was transitioned to sliding scale with detemir 10 units qHS.   Discharge Vitals:   BP (!) 150/43 (BP Location: Right Arm)   Pulse 80   Temp 98.5 F (36.9 C) (Oral)   Resp 16   Ht 5\' 2"  (1.575 m)   Wt 132 lb 4.4 oz (60 kg)   LMP  (LMP Unknown)   SpO2 98%   BMI 24.19 kg/m   Pertinent Labs, Studies, and Procedures:   Urine culture 12/02/2016 MULTIPLE SPECIES PRESENT, SUGGEST RECOLLECTION  Discharge Instructions: Discharge Instructions    Call MD for:  extreme fatigue    Complete by:  As directed    Call MD for:  redness, tenderness, or signs of infection (pain, swelling, redness, odor or green/yellow discharge around incision site)    Complete by:  As directed    Call MD for:  temperature >100.4    Complete by:  As directed    Diet - low sodium heart healthy    Complete by:  As directed    Increase activity slowly    Complete by:  As directed       Signed: Ledell Noss, MD 12/10/2016, 7:44 PM   Pager: [phone]

## 2016-12-27 MED FILL — ?DULOXETINE HCL DR 60 MG CA: 60 MG | 30 days supply | Qty: 30 | Fill #2

## 2017-01-08 ENCOUNTER — Other Ambulatory Visit: Payer: Self-pay | Admitting: Family Medicine

## 2017-01-08 DIAGNOSIS — I10 Essential (primary) hypertension: Secondary | ICD-10-CM

## 2017-01-08 MED FILL — GABAPENTIN 300 MG CAPSULE: 300 | 30 days supply | Qty: 60 | Fill #2

## 2017-01-08 MED FILL — ATORVASTATIN 40 MG TABLET: 40 | 30 days supply | Qty: 30 | Fill #0

## 2017-01-15 ENCOUNTER — Other Ambulatory Visit: Payer: Self-pay | Admitting: Family Medicine

## 2017-01-15 DIAGNOSIS — I10 Essential (primary) hypertension: Secondary | ICD-10-CM

## 2017-01-18 ENCOUNTER — Ambulatory Visit: Payer: Self-pay | Attending: Family Medicine | Admitting: Family Medicine

## 2017-01-18 ENCOUNTER — Encounter: Payer: Self-pay | Admitting: Family Medicine

## 2017-01-18 VITALS — BP 121/68 | HR 79 | Temp 98.4°F | Resp 12 | Wt 128.0 lb

## 2017-01-18 DIAGNOSIS — G4700 Insomnia, unspecified: Secondary | ICD-10-CM | POA: Insufficient documentation

## 2017-01-18 DIAGNOSIS — Z9114 Patient's other noncompliance with medication regimen: Secondary | ICD-10-CM | POA: Insufficient documentation

## 2017-01-18 DIAGNOSIS — I120 Hypertensive chronic kidney disease with stage 5 chronic kidney disease or end stage renal disease: Secondary | ICD-10-CM | POA: Insufficient documentation

## 2017-01-18 DIAGNOSIS — I10 Essential (primary) hypertension: Secondary | ICD-10-CM

## 2017-01-18 DIAGNOSIS — Z89422 Acquired absence of other left toe(s): Secondary | ICD-10-CM | POA: Insufficient documentation

## 2017-01-18 DIAGNOSIS — E079 Disorder of thyroid, unspecified: Secondary | ICD-10-CM | POA: Insufficient documentation

## 2017-01-18 DIAGNOSIS — H4312 Vitreous hemorrhage, left eye: Secondary | ICD-10-CM | POA: Insufficient documentation

## 2017-01-18 DIAGNOSIS — Z794 Long term (current) use of insulin: Secondary | ICD-10-CM | POA: Insufficient documentation

## 2017-01-18 DIAGNOSIS — Z9889 Other specified postprocedural states: Secondary | ICD-10-CM | POA: Insufficient documentation

## 2017-01-18 DIAGNOSIS — H538 Other visual disturbances: Secondary | ICD-10-CM | POA: Insufficient documentation

## 2017-01-18 DIAGNOSIS — E1165 Type 2 diabetes mellitus with hyperglycemia: Secondary | ICD-10-CM | POA: Insufficient documentation

## 2017-01-18 DIAGNOSIS — N186 End stage renal disease: Secondary | ICD-10-CM | POA: Insufficient documentation

## 2017-01-18 DIAGNOSIS — Z992 Dependence on renal dialysis: Secondary | ICD-10-CM | POA: Insufficient documentation

## 2017-01-18 DIAGNOSIS — R0982 Postnasal drip: Secondary | ICD-10-CM | POA: Insufficient documentation

## 2017-01-18 DIAGNOSIS — E1140 Type 2 diabetes mellitus with diabetic neuropathy, unspecified: Secondary | ICD-10-CM | POA: Insufficient documentation

## 2017-01-18 DIAGNOSIS — Z79899 Other long term (current) drug therapy: Secondary | ICD-10-CM | POA: Insufficient documentation

## 2017-01-18 DIAGNOSIS — Z9119 Patient's noncompliance with other medical treatment and regimen: Secondary | ICD-10-CM | POA: Insufficient documentation

## 2017-01-18 DIAGNOSIS — G4709 Other insomnia: Secondary | ICD-10-CM

## 2017-01-18 DIAGNOSIS — E1122 Type 2 diabetes mellitus with diabetic chronic kidney disease: Secondary | ICD-10-CM | POA: Insufficient documentation

## 2017-01-18 DIAGNOSIS — E119 Type 2 diabetes mellitus without complications: Secondary | ICD-10-CM

## 2017-01-18 DIAGNOSIS — E1149 Type 2 diabetes mellitus with other diabetic neurological complication: Secondary | ICD-10-CM

## 2017-01-18 DIAGNOSIS — E7800 Pure hypercholesterolemia, unspecified: Secondary | ICD-10-CM | POA: Insufficient documentation

## 2017-01-18 LAB — POCT GLYCOSYLATED HEMOGLOBIN (HGB A1C): HEMOGLOBIN A1C: 10.5

## 2017-01-18 LAB — GLUCOSE, POCT (MANUAL RESULT ENTRY): POC GLUCOSE: 300 mg/dL — AB (ref 70–99)

## 2017-01-18 MED ORDER — CETIRIZINE HCL 10 MG PO TABS: 10.0000 mg | ORAL_TABLET | Freq: Every day | ORAL | 5 refills | Status: DC

## 2017-01-18 MED ORDER — ATORVASTATIN CALCIUM 40 MG PO TABS: 40.0000 mg | ORAL_TABLET | Freq: Every day | ORAL | 5 refills | Status: DC

## 2017-01-18 MED ORDER — VALSARTAN 160 MG PO TABS: 160.0000 mg | ORAL_TABLET | Freq: Every day | ORAL | 5 refills | Status: DC

## 2017-01-18 MED ORDER — GABAPENTIN 300 MG PO CAPS: 600.0000 mg | ORAL_CAPSULE | Freq: Every day | ORAL | 5 refills | Status: DC

## 2017-01-18 MED ORDER — DULOXETINE HCL 60 MG PO CPEP: 60.0000 mg | ORAL_CAPSULE | Freq: Every day | ORAL | 5 refills | Status: DC

## 2017-01-18 MED ORDER — LANTUS 100 UNIT/ML ~~LOC~~ SOLN: 15.0000 [IU] | Freq: Two times a day (BID) | SUBCUTANEOUS | 5 refills | Status: DC

## 2017-01-18 MED ORDER — TRAZODONE HCL 100 MG PO TABS: 100.0000 mg | ORAL_TABLET | Freq: Every evening | ORAL | 3 refills | Status: DC | PRN

## 2017-01-18 MED FILL — VALSARTAN 160 MG TABLET: 160 | 30 days supply | Qty: 30 | Fill #0

## 2017-01-18 MED FILL — traZODone HCL 100 MG TABS: 100 | 30 days supply | Qty: 30 | Fill #0

## 2017-01-18 MED FILL — GABAPENTIN 300 MG CAPSULE: 300 | 3 days supply | Qty: 60 | Fill #0

## 2017-01-18 MED FILL — ?CETIRIZINE HCL 10 MG TABLE: 10 | 30 days supply | Qty: 30 | Fill #0

## 2017-01-18 MED FILL — ATORVASTATIN 40 MG TABLET: 40 | 30 days supply | Qty: 30 | Fill #0

## 2017-01-18 MED FILL — LANTUS 100 UNITS/ML VIAL: 100 | 33 days supply | Qty: 10 | Fill #0

## 2017-01-18 MED FILL — DULoxetine HCL 60 MG CPEP: 60 | 30 days supply | Qty: 30 | Fill #0

## 2017-01-18 NOTE — Patient Instructions (Signed)
La diabetes mellitus y los alimentos (Diabetes Mellitus and Food) Es importante que controle su nivel de azcar en la sangre (glucosa). El nivel de glucosa en sangre depende en gran medida de lo que usted come. Comer alimentos saludables en las cantidades [place] a lo largo del Training and development officer, aproximadamente a la misma hora [employer], lo ayudar a Chief Technology Officer su nivel de Multimedia programmer. Tambin puede ayudarlo a retrasar o Patent attorney de la diabetes mellitus. Comer de [employer] saludable incluso puede ayudarlo a Chartered loss adjuster de presin arterial y a Science writer o Theatre manager un peso saludable. Entre las recomendaciones generales para alimentarse y Audiological scientist los alimentos de forma saludable, se incluyen las siguientes:  Respetar las comidas principales y comer colaciones con regularidad. Evitar pasar largos perodos sin comer con el fin de perder peso.  Seguir una dieta que consista principalmente en alimentos de origen vegetal, como frutas, vegetales, frutos secos, legumbres y cereales integrales.  Utilizar mtodos de coccin a baja temperatura, como hornear, en lugar de mtodos de coccin a alta temperatura, como frer en abundante aceite. Trabaje con el nutricionista para aprender a Financial planner nutricional de las etiquetas de los alimentos. CMO PUEDEN AFECTARME LOS ALIMENTOS? Carbohidratos Los carbohidratos afectan el nivel de glucosa en sangre ms que cualquier otro tipo de alimento. El nutricionista lo ayudar a Teacher, adult education cuntos carbohidratos puede consumir en cada comida y ensearle a contarlos. El recuento de carbohidratos es importante para mantener la glucosa en sangre en un nivel saludable, en especial si utiliza insulina o toma determinados medicamentos para la diabetes mellitus. Alcohol El alcohol puede provocar disminuciones sbitas de la glucosa en sangre (hipoglucemia), en especial si utiliza insulina o toma determinados medicamentos para la diabetes mellitus. La  hipoglucemia es una afeccin que puede poner en peligro la vida. Los sntomas de la hipoglucemia (somnolencia, mareos y Data processing manager) son similares a los sntomas de haber consumido mucho alcohol. Si el mdico lo autoriza a beber alcohol, hgalo con moderacin y siga estas pautas:  Las mujeres no deben beber ms de un trago por da, y los hombres no deben beber ms de dos tragos por Training and development officer. Un trago es igual a: ? 12 onzas (355 ml) de cerveza ? 5 onzas de vino (150 ml) de vino ? 1,5onzas (49ml) de bebidas espirituosas  No beba con el estmago vaco.  Mantngase hidratado. Beba agua, gaseosas dietticas o t helado sin azcar.  Las gaseosas comunes, los jugos y otros refrescos podran contener muchos carbohidratos y se Civil Service fast streamer. QU ALIMENTOS NO SE RECOMIENDAN? Cuando haga las elecciones de alimentos, es importante que recuerde que todos los alimentos son distintos. Algunos tienen menos nutrientes que otros por porcin, aunque podran tener la misma cantidad de caloras o carbohidratos. Es difcil darle al cuerpo lo que necesita cuando consume alimentos con menos nutrientes. Estos son algunos ejemplos de alimentos que debera evitar ya que contienen muchas caloras y carbohidratos, pero pocos nutrientes:  Physicist, medical trans (la mayora de los alimentos procesados incluyen grasas trans en la etiqueta de Informacin nutricional).  Gaseosas comunes.  Jugos.  Caramelos.  Dulces, como tortas, pasteles, rosquillas y [place].  Comidas fritas. QU ALIMENTOS PUEDO COMER? Consuma alimentos ricos en nutrientes, que nutrirn el cuerpo y lo mantendrn saludable. Los alimentos que debe comer tambin dependern de varios factores, como:  Las caloras que necesita.  Los medicamentos que toma.  Su peso.  El nivel de glucosa en [place].  El [place] de presin arterial.  El nivel de colesterol. Debe consumir  una amplia variedad de alimentos, por ejemplo:  Protenas. ? Cortes de [employer]. ? Protenas con bajo contenido de grasas saturadas, como pescado, clara de huevo y frijoles. Evite las carnes procesadas.  Frutas y vegetales. ? [place] y Photographer que pueden ayudar a [employer] niveles sanguneos de [place], como [place], mangos y batatas.  Productos lcteos. ? Elija productos lcteos sin grasa o con bajo contenido de [place], como [place], yogur y [place].  Cereales, panes, pastas y arroz. ? Elija cereales integrales, como panes multicereales, avena en grano y arroz integral. Estos alimentos pueden ayudar a controlar la presin arterial.  Daphene Jaeger. ? Alimentos que contengan grasas saludables, como frutos secos, Musician, aceite de [place], aceite de canola y pescado. TODOS LOS QUE PADECEN DIABETES MELLITUS TIENEN EL [place] PLAN DE [place]? Dado que todas las personas que padecen diabetes mellitus son distintas, no hay un solo plan de comidas que funcione para todos. Es muy importante que se rena con un nutricionista que lo ayudar a crear un plan de comidas adecuado para usted. Esta informacin no tiene [name] el consejo del mdico. Asegrese de hacerle al mdico cualquier pregunta que tenga. Document Released: 09/26/2007 Document Revised: 07/10/2014 Document Reviewed: 05/16/2013 Elsevier Interactive Patient Education  2017 [name].

## 2017-01-18 NOTE — Progress Notes (Signed)
Subjective:  Patient ID: Sarah Kelley, female    DOB: Nov 13, 1950  Age: 66 y.o. 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  She endorses compliance with her Lantus but takes anywhere from 10-12 units twice daily and does not have her blood sugar log with her. A1c has trended up from 7.4, three months ago and is now at 10.5. She denies hypoglycemic episodes.  She has been compliant with all her medications and denies side effects.  Her son complains that the patient rarely sleeps; she has gone 2-3 nights per week without sleep and would as a result spent the next day sleeping the entire day.  Review of records from care everywhere  indicate she was seen at [facility] for vitreous hemorrhage of the left eye 2 weeks ago and is being worked up for possible vitrectomy. She continues to complain of blurry vision in her left eye.  She is scheduled for dialysis session today.  Past Medical History:  Diagnosis Date  . Diabetes mellitus   . Hyperlipidemia   . Hypertension   . Neuropathy    Diabetic  . Pneumonia 07/2016  . Renal disorder   . Thyroid disease     Past Surgical History:  Procedure Laterality Date  . AMPUTATION Left 05/21/2015   Procedure: Left Foot 3rd Ray Amputation;  Surgeon: Newt Minion, MD;  Location: [place];  Service: Orthopedics;  Laterality: Left;  . AV FISTULA PLACEMENT Left 03/28/2016   Procedure: ARTERIOVENOUS (AV) FISTULA CREATION LEFT ARM;  Surgeon: Waynetta Sandy, MD;  Location: [place];  Service: Vascular;  Laterality: Left;  . DILATION AND CURETTAGE OF UTERUS    .  FISTULA SUPERFICIALIZATION Left 09/04/2016   Procedure: FISTULA SUPERFICIALIZATION LEFT UPPER ARM;  Surgeon: Waynetta Sandy, MD;  Location: [place];  Service: Vascular;  Laterality: Left;  . IR AV DIALY SHUNT INTRO NEEDLE/INTRACATH INITIAL W/PTA/IMG LEFT  11/10/2016  . IR GENERIC HISTORICAL  08/01/2016   IR FLUORO GUIDE CV LINE RIGHT 08/01/2016 [name], MD [facility]INTER[facility]  . IR GENERIC HISTORICAL  08/01/2016   IR US GUIDE VASC ACCESS RIGHT 08/01/2016 [name], MD [facility]  . IR REMOVAL TUN CV CATH W/O FL  11/01/2016  . IR US GUIDE VASC ACCESS LEFT  11/10/2016    Allergies  Allergen Reactions  . No Known Allergies      Outpatient Medications Prior to Visit  Medication Sig Dispense Refill  . calcium acetate (PHOSLO) 667 MG capsule Take 2 capsules (1,334 mg total) by mouth 3 (three) times daily with meals. (Patient not taking: Reported on 10/06/2016) 180 capsule 3  . insulin detemir (LEVEMIR) 100 UNIT/ML injection Inject 0.1 mLs (10 Units total) into the skin at bedtime. (Patient not taking: Reported on 12/02/2016) 10 mL 5  . olopatadine (PATANOL) 0.1 % ophthalmic solution Place 1 drop into both eyes 2 (two) times daily. 5 mL 2  . atorvastatin (LIPITOR) 40 MG tablet Take 1 tablet (40 mg total) by mouth daily. 30 tablet 5  . cefdinir (OMNICEF) 300 MG capsule Take one tablet ( 300 mg ) after dialysis on Tuesday 6/5  and Thursday 6/7 2 capsule 0  . cetirizine (ZYRTEC) 10 MG tablet Take 0.5 tablets (5 mg total) by mouth daily. 30 tablet 5  . DULoxetine (CYMBALTA) 60 MG capsule Take 1 capsule (60 mg total) by mouth daily. 30 capsule 5  . gabapentin (NEURONTIN) 300 MG capsule Take 600 mg by mouth at bedtime.   3  . LANTUS 100 UNIT/ML injection Inject 8-10 Units into the skin 2 (two) times daily. MORNING and NOON(TIME)  5  . valsartan (DIOVAN) 160 MG tablet Take 160 mg by mouth daily.  3   No facility-administered medications prior to visit.     ROS Review of Systems  Constitutional:  Negative for activity change, appetite change and fatigue.  HENT: Negative for congestion, sinus pressure and sore throat.   Eyes: Positive for visual disturbance.  Respiratory: Negative for cough, chest tightness, shortness of breath and wheezing.   Cardiovascular: Negative for chest pain and palpitations.  Gastrointestinal: Negative for abdominal distention, abdominal pain and constipation.  Endocrine: Negative for polydipsia.  Genitourinary: Negative for dysuria and frequency.  Musculoskeletal: Negative for arthralgias and back pain.  Skin: Negative for rash.  Neurological: Negative for tremors, light-headedness and numbness.  Hematological: Does not bruise/bleed easily.  Psychiatric/Behavioral: Positive for sleep disturbance. Negative for agitation and behavioral problems.    Objective:  BP 121/68   Pulse 79   Temp 98.4 F (36.9 C) (Oral)   Resp 12   Wt 128 lb (58.1 kg)   LMP  (LMP Unknown)   SpO2 97%   BMI 23.41 kg/m   BP/Weight 01/18/2017 03/05/5700 01/06/9389  Systolic BP 300 923 -  Diastolic BP 68 43 -  Wt. (Lbs) 128 - 132.28  BMI 23.41 - 24.19     Physical Exam Constitutional: Patient appears well-developed and well-nourished. No distress. 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 TM is normal bilaterally, normal oropharynx  CVS: Normal rate rate, regular rhythm, S1/S2 +, no murmurs, no gallops, no carotid bruit.  Pulmonary: Effort and breath sounds normal, no stridor, rhonchi, wheezes, rales.  Abdominal: Soft. BS +,  no distension, + slight lower abdominal tenderness, no rebound or guarding.  Musculoskeletal: Left brachiocephalic AV fistula - occluded Lymphadenopathy: No lymphadenopathy noted, cervical, inguinal or axillary Neuro: Alert. Normal reflexes, muscle tone coordination. No cranial nerve deficit. Skin: Skin is warm and dry. No rash noted. Not diaphoretic. No erythema. No pallor. Psychiatric: Normal mood and affect. Behavior, judgment, thought content normal.   Lab Results    Component Value Date   HGBA1C 10.5 01/18/2017    Assessment & Plan:   1. Insulin-requiring or dependent type II diabetes mellitus ([place]) Uncontrolled with A1c of 10.5 Poor control due to noncompliance with prescribed regimen as her spouse had admitted in the past to adjusting Lantus dose according to his own discretion depending on sugars Adherence to diabetic diet emphasized - Glucose (CBG) - HgB A1c - LANTUS 100 UNIT/ML injection; Inject 0.15 mLs (15 Units total) into the skin 2 (two) times daily.  Dispense: 30 mL; Refill: 5  2. Other diabetic neurological complication associated with type 2 diabetes mellitus (HCC) Stable - gabapentin (NEURONTIN) 300 MG capsule; Take 2 capsules (600 mg total) by mouth at bedtime.  Dispense: 60 capsule; Refill: 5 - DULoxetine (CYMBALTA) 60 MG capsule; Take 1 capsule (60 mg total) by mouth daily.  Dispense: 30 capsule; Refill: 5  3. Pure hypercholesterolemia Low cholesterol diet - atorvastatin (LIPITOR) 40 MG tablet; Take 1 tablet (40 mg total) by mouth daily.  Dispense:  30 tablet; Refill: 5  4. Post-nasal drip Uncontrolled Emphasize compliance with Zyrtec - cetirizine (ZYRTEC) 10 MG tablet; Take 1 tablet (10 mg total) by mouth daily.  Dispense: 30 tablet; Refill: 5  5. Other insomnia New onset - Discussed sleep hygiene Ongoing medical conditions could contribute to insomnia - traZODone (DESYREL) 100 MG tablet; Take 1 tablet (100 mg total) by mouth at bedtime as needed for sleep.  Dispense: 30 tablet; Refill: 3  6. Essential hypertension Controlled Low-sodium diet - valsartan (DIOVAN) 160 MG tablet; Take 1 tablet (160 mg total) by mouth daily.  Dispense: 30 tablet; Refill: 5  7. Vitreous hemorrhage of the left eye Continue eyedrops Management as per ophthalmology at [place] ordered this encounter  Medications  . gabapentin (NEURONTIN) 300 MG capsule    Sig: Take 2 capsules (600 mg total) by mouth at bedtime.    Dispense:  60 capsule     Refill:  5  . DULoxetine (CYMBALTA) 60 MG capsule    Sig: Take 1 capsule (60 mg total) by mouth daily.    Dispense:  30 capsule    Refill:  5  . atorvastatin (LIPITOR) 40 MG tablet    Sig: Take 1 tablet (40 mg total) by mouth daily.    Dispense:  30 tablet    Refill:  5    Discontinue previous dose  . valsartan (DIOVAN) 160 MG tablet    Sig: Take 1 tablet (160 mg total) by mouth daily.    Dispense:  30 tablet    Refill:  5  . cetirizine (ZYRTEC) 10 MG tablet    Sig: Take 1 tablet (10 mg total) by mouth daily.    Dispense:  30 tablet    Refill:  5  . traZODone (DESYREL) 100 MG tablet    Sig: Take 1 tablet (100 mg total) by mouth at bedtime as needed for sleep.    Dispense:  30 tablet    Refill:  3  . LANTUS 100 UNIT/ML injection    Sig: Inject 0.15 mLs (15 Units total) into the skin 2 (two) times daily.    Dispense:  30 mL    Refill:  5    Discontinue previous dose    Follow-up: 3 months  Arnoldo Morale MD

## 2017-01-18 NOTE — Progress Notes (Signed)
Insomnia Nervous Especially on days she has dialysis

## 2017-02-05 MED FILL — LIDOCAINE-PRILOCAINE CREAM: 2.5-2.5 | 30 days supply | Qty: 30 | Fill #1

## 2017-02-12 ENCOUNTER — Other Ambulatory Visit: Payer: Self-pay

## 2017-02-12 DIAGNOSIS — T82510A Breakdown (mechanical) of surgically created arteriovenous fistula, initial encounter: Secondary | ICD-10-CM

## 2017-02-14 ENCOUNTER — Telehealth: Payer: Self-pay | Admitting: Family Medicine

## 2017-02-14 NOTE — Telephone Encounter (Signed)
error 

## 2017-02-21 MED FILL — ?ATORVASTATIN 40MG TABLET: 40 | 30 days supply | Qty: 30 | Fill #1

## 2017-02-21 MED FILL — DULoxetine HCL 60 MG CPEP: 60 | 30 days supply | Qty: 30 | Fill #1

## 2017-02-21 MED FILL — VALSARTAN 160 MG TABLET: 160 | 30 days supply | Qty: 30 | Fill #1

## 2017-02-28 MED FILL — ?CETIRIZINE HCL 10 MG TABLE: 10 | 30 days supply | Qty: 30 | Fill #1

## 2017-03-02 ENCOUNTER — Encounter: Payer: Self-pay | Admitting: Family Medicine

## 2017-03-02 ENCOUNTER — Other Ambulatory Visit (HOSPITAL_COMMUNITY): Payer: Self-pay | Admitting: Nephrology

## 2017-03-02 ENCOUNTER — Ambulatory Visit: Payer: Self-pay | Attending: Family Medicine | Admitting: Family Medicine

## 2017-03-02 VITALS — BP 151/72 | HR 75 | Temp 98.3°F | Ht 65.0 in | Wt 132.0 lb

## 2017-03-02 DIAGNOSIS — G4700 Insomnia, unspecified: Secondary | ICD-10-CM | POA: Insufficient documentation

## 2017-03-02 DIAGNOSIS — E1140 Type 2 diabetes mellitus with diabetic neuropathy, unspecified: Secondary | ICD-10-CM | POA: Insufficient documentation

## 2017-03-02 DIAGNOSIS — E119 Type 2 diabetes mellitus without complications: Secondary | ICD-10-CM

## 2017-03-02 DIAGNOSIS — N186 End stage renal disease: Secondary | ICD-10-CM | POA: Insufficient documentation

## 2017-03-02 DIAGNOSIS — R05 Cough: Secondary | ICD-10-CM

## 2017-03-02 DIAGNOSIS — Z992 Dependence on renal dialysis: Secondary | ICD-10-CM | POA: Insufficient documentation

## 2017-03-02 DIAGNOSIS — Z89422 Acquired absence of other left toe(s): Secondary | ICD-10-CM | POA: Insufficient documentation

## 2017-03-02 DIAGNOSIS — H9202 Otalgia, left ear: Secondary | ICD-10-CM

## 2017-03-02 DIAGNOSIS — G4709 Other insomnia: Secondary | ICD-10-CM

## 2017-03-02 DIAGNOSIS — Z79899 Other long term (current) drug therapy: Secondary | ICD-10-CM | POA: Insufficient documentation

## 2017-03-02 DIAGNOSIS — Z23 Encounter for immunization: Secondary | ICD-10-CM

## 2017-03-02 DIAGNOSIS — E785 Hyperlipidemia, unspecified: Secondary | ICD-10-CM | POA: Insufficient documentation

## 2017-03-02 DIAGNOSIS — R059 Cough, unspecified: Secondary | ICD-10-CM

## 2017-03-02 DIAGNOSIS — Z794 Long term (current) use of insulin: Secondary | ICD-10-CM | POA: Insufficient documentation

## 2017-03-02 DIAGNOSIS — I120 Hypertensive chronic kidney disease with stage 5 chronic kidney disease or end stage renal disease: Secondary | ICD-10-CM | POA: Insufficient documentation

## 2017-03-02 DIAGNOSIS — E1122 Type 2 diabetes mellitus with diabetic chronic kidney disease: Secondary | ICD-10-CM | POA: Insufficient documentation

## 2017-03-02 DIAGNOSIS — E079 Disorder of thyroid, unspecified: Secondary | ICD-10-CM | POA: Insufficient documentation

## 2017-03-02 DIAGNOSIS — E1165 Type 2 diabetes mellitus with hyperglycemia: Secondary | ICD-10-CM | POA: Insufficient documentation

## 2017-03-02 LAB — GLUCOSE, POCT (MANUAL RESULT ENTRY): POC GLUCOSE: 337 mg/dL — AB (ref 70–99)

## 2017-03-02 MED ORDER — ZOLPIDEM TARTRATE 5 MG PO TABS: 5.0000 mg | ORAL_TABLET | Freq: Every evening | ORAL | 2 refills | Status: DC | PRN

## 2017-03-02 MED ORDER — BENZONATATE 100 MG PO CAPS: 100.0000 mg | ORAL_CAPSULE | Freq: Two times a day (BID) | ORAL | 0 refills | Status: DC | PRN

## 2017-03-02 MED ORDER — CIPROFLOXACIN-DEXAMETHASONE 0.3-0.1 % OT SUSP: 4.0000 [drp] | Freq: Two times a day (BID) | OTIC | 0 refills | Status: DC

## 2017-03-02 MED FILL — BENZONATATE 100 MG CAPSULE: 100 | 15 days supply | Qty: 30 | Fill #0

## 2017-03-02 MED FILL — CIPRODEX OTIC SUSPENSION: 0.3-0.1 | 14 days supply | Qty: 8 | Fill #0

## 2017-03-02 MED FILL — ETHYL CHLORIDE SPRAY: 30 days supply | Qty: 104 | Fill #0

## 2017-03-02 NOTE — Patient Instructions (Signed)
Insomnio  (Insomnia)  El insomnio es un trastorno del sueo que causa dificultades para conciliar el sueo o para mantenerlo. Puede producir cansancio (fatiga), falta de energa, dificultad para concentrarse, cambios en el estado de nimo y mal rendimiento escolar o laboral.  Hay tres formas diferentes de clasificar el insomnio:   Dificultad para conciliar el sueo.   Dificultad para mantener el sueo.   Despertar muy precoz por la maana.  Cualquier tipo de insomnio puede ser a largo plazo (crnico) o a corto plazo (agudo). Ambos son frecuentes. Generalmente, el insomnio a corto plazo dura tres meses o menos tiempo. El crnico ocurre al menos tres veces por semana durante ms de tres meses.  CAUSAS  El insomnio puede deberse a otra afeccin, situacin o sustancia, por ejemplo:   Ansiedad.   Algunos medicamentos.   Enfermedad por reflujo gastroesofgico (ERGE) u otras enfermedades gastrointestinales.   Asma y otras enfermedades respiratorias.   Sndrome de las piernas inquietas, apnea del sueo u otros trastornos del sueo.   Dolor crnico.   Menopausia, que puede incluir calores repentinos.   Ictus.   Consumo excesivo de alcohol, tabaco u drogas ilegales.   Depresin.   Cafena.   Trastornos neurolgicos, como enfermedad de Alzheimer.   Hiperactividad tiroidea (hipertiroidismo).  Es posible que la causa del insomnio no se conozca.  FACTORES DE RIESGO  Los factores de riesgo de tener insomnio incluyen lo siguiente:   El sexo. La mujeres se ven ms afectadas que los hombres.   La edad. El insomnio es ms frecuente a medida que una persona envejece.   El estrs. Esto puede incluir su vida profesional o personal.   Los ingresos. El insomnio es ms frecuente en las personas cuyos ingresos son ms bajos.   La falta de actividad fsica.   Los horarios de trabajo irregulares o los turnos nocturnos.   Los viajes a lugares de diferentes zonas horarias.  SIGNOS Y SNTOMAS  Si tiene insomnio, el sntoma  principal es la dificultad para conciliar el sueo o mantenerlo. Esto puede derivar en otros sntomas, por ejemplo:   Sentirse fatigado.   Ponerse nervioso por tener que irse a dormir.   No sentirse descansado por la maana.   Tener dificultad para concentrarse.   Sentirse irritable, ansioso o deprimido.  TRATAMIENTO  El tratamiento para el insomnio depende de la causa. Si se debe a una enfermedad preexistente, el tratamiento se centrar en el abordaje de la enfermedad. El tratamiento tambin puede incluir lo siguiente:   Medicamentos que lo ayuden a dormir.   Asesoramiento psicolgico o terapia.   Cambios en el estilo de vida.  INSTRUCCIONES PARA EL CUIDADO EN EL HOGAR   Tome los medicamentos solamente como se lo haya indicado el mdico.   Establezca horarios habituales para dormir y despertarse. No tome siestas.   Lleve un registro del sueo ya que podra ser de utilidad para que usted y a su mdico puedan determinar qu podra estar causndole insomnio. Incluya lo siguiente:  ? Cundo duerme.  ? Cundo se despierta durante la noche.  ? Qu tan bien duerme.  ? Qu tan relajado se siente al da siguiente.  ? Cualquier efecto secundario de los medicamentos que toma.  ? Lo que usted come y bebe.   Convierta a su habitacin en un lugar cmodo donde sea fcil conciliar el sueo:  ? Coloque persianas o cortinas especiales oscuras que impidan la entrada de la luz del exterior.  ? Para bloquear los ruidos, use   un aparato que reproduce sonidos ambientales o relajantes de fondo.  ? Mantenga baja la temperatura.   Haga ejercicio regularmente como se lo haya indicado el mdico. No haga ejercicio justo antes de la hora de acostarse.   Utilice tcnicas de relajacin para controlar el estrs. Pdale al mdico que le sugiera algunas tcnicas que sean adecuadas para usted. Estos pueden incluir lo siguiente:  ? Ejercicios de respiracin.  ? Rutinas para aliviar la tensin muscular.  ? Visualizacin de escenas  apacibles.   Disminucin del consumo de alcohol, bebidas con cafena y cigarrillos, especialmente cerca de la hora de acostarse, ya que pueden perturbarle el sueo.   No coma en exceso ni consuma comidas picantes justo antes de la hora de acostarse. Esto puede causarle molestias digestivas y dificultades para dormir.   Limite el uso de pantallas antes de la hora de acostarse. Esto incluye lo siguiente:  ? Mirar televisin.  ? Usar el telfono inteligente, la tableta y la computadora.   Siga una rutina. Esto puede ayudarlo a conciliar el sueo ms rpidamente. Intente hacer una actividad tranquila, cepillarse los dientes e irse a la cama a la misma hora todas las noches.   Levntese de la cama si sigue despierto despus de 15minutos de haber intentado dormirse. Mantenga bajas las luces, pero intente leer o hacer una actividad tranquila. Cuando tenga sueo, regrese a la cama.   Conduzca con cuidado. No conduzca si est muy somnoliento.   Concurra a todas las visitas de control, segn le indique su mdico. Esto es importante.  SOLICITE ATENCIN MDICA SI:   Est cansado durante el da o tiene dificultades en su rutina diaria debido a la somnolencia.   Sigue teniendo problemas para dormir o estos empeoran.  SOLICITE ATENCIN MDICA DE INMEDIATO SI:   Tiene pensamientos serios acerca de lastimarse a usted mismo o daar a otra persona.  Esta informacin no tiene como fin reemplazar el consejo del mdico. Asegrese de hacerle al mdico cualquier pregunta que tenga.  Document Released: 06/19/2005 Document Revised: 07/10/2014 Document Reviewed: 03/20/2014  Elsevier Interactive Patient Education  2018 Elsevier Inc.

## 2017-03-02 NOTE — Telephone Encounter (Signed)
Called to schedule fistulagram, left message. AW

## 2017-03-02 NOTE — Progress Notes (Signed)
Subjective:  Patient ID: Sarah Kelley, female    DOB: 1950-09-23  Age: 66 y.o. 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  She complains of a one-week history of left ear ache but denies drainage. She does have decreased hearing but this is not new. Denies sinus tenderness or sinus pressure and has not had a fever. She does have chronic postnasal drip and also chronic cough which is not controlled on Zyrtec.  She was placed on trazodone for insomnia recently but symptoms have not been well controlled as some nights she gets 6 hours of sleep and at other nights she is awake through the night and following day.  She has been compliant with her Lantus and denies hypoglycemia.  Past Medical History:  Diagnosis Date  . Diabetes mellitus   . Hyperlipidemia   . Hypertension   . Neuropathy    Diabetic  . Pneumonia 07/2016  . Renal disorder   . Thyroid disease     Past Surgical History:  Procedure Laterality Date  . AMPUTATION Left 05/21/2015   Procedure: Left Foot 3rd Ray Amputation;  Surgeon: Newt Minion, MD;  Location: [facility];  Service: Orthopedics;  Laterality: Left;  . AV FISTULA PLACEMENT Left 03/28/2016   Procedure: ARTERIOVENOUS (AV) FISTULA CREATION LEFT ARM;  Surgeon: Waynetta Sandy, MD;  Location: [facility];  Service: Vascular;  Laterality: Left;  . DILATION AND CURETTAGE OF UTERUS    . FISTULA SUPERFICIALIZATION Left 09/04/2016   Procedure: FISTULA SUPERFICIALIZATION LEFT UPPER ARM;  Surgeon: Waynetta Sandy, MD;  Location: [facility];  Service: Vascular;  Laterality: Left;  . IR AV DIALY SHUNT INTRO NEEDLE/INTRACATH INITIAL W/PTA/IMG LEFT   11/10/2016  . IR GENERIC HISTORICAL  08/01/2016   IR FLUORO GUIDE CV LINE RIGHT 08/01/2016 [name], MD [facility]  . IR GENERIC HISTORICAL  08/01/2016   IR US GUIDE VASC ACCESS RIGHT 08/01/2016 [name], MD [facility]  . IR REMOVAL TUN CV CATH W/O FL  11/01/2016  . IR US GUIDE VASC ACCESS LEFT  11/10/2016    Allergies  Allergen Reactions  . No Known Allergies     Outpatient Medications Prior to Visit  Medication Sig Dispense Refill  . atorvastatin (LIPITOR) 40 MG tablet Take 1 tablet (40 mg total) by mouth daily. 30 tablet 5  . cetirizine (ZYRTEC) 10 MG tablet Take 1 tablet (10 mg total) by mouth daily. 30 tablet 5  . DULoxetine (CYMBALTA) 60 MG capsule Take 1 capsule (60 mg total) by mouth daily. 30 capsule 5  . gabapentin (NEURONTIN) 300 MG capsule Take 2 capsules (600 mg total) by mouth at bedtime. 60 capsule 5  . LANTUS 100 UNIT/ML injection Inject 0.15 mLs (15 Units total) into the skin 2 (two) times daily. 30 mL 5  . olopatadine (PATANOL) 0.1 % ophthalmic solution Place 1 drop into both eyes 2 (two) times daily. 5 mL 2  . valsartan (DIOVAN) 160 MG tablet Take 1 tablet (160 mg total) by mouth daily. 30 tablet 5  . traZODone (DESYREL) 100 MG tablet Take 1 tablet (100 mg total) by mouth at bedtime as needed for sleep. 30 tablet 3  . calcium  acetate (PHOSLO) 667 MG capsule Take 2 capsules (1,334 mg total) by mouth 3 (three) times daily with meals. (Patient not taking: Reported on 10/06/2016) 180 capsule 3  . insulin detemir (LEVEMIR) 100 UNIT/ML injection Inject 0.1 mLs (10 Units total) into the skin at bedtime. (Patient not taking: Reported on 12/02/2016) 10 mL 5   No facility-administered medications prior to visit.     ROS Review of Systems Constitutional: Negative for activity change, appetite change and fatigue.  HENT: Negative for congestion, sinus pressure and sore throat.   Eyes: Positive for visual disturbance.  Respiratory: Negative for cough, chest tightness, shortness  of breath and wheezing.   Cardiovascular: Negative for chest pain and palpitations.  Gastrointestinal: Negative for abdominal distention, abdominal pain and constipation.  Endocrine: Negative for polydipsia.  Genitourinary: Negative for dysuria and frequency.  Musculoskeletal: Negative for arthralgias and back pain.  Skin: Negative for rash.  Neurological: Negative for tremors, light-headedness and numbness.  Hematological: Does not bruise/bleed easily.  Psychiatric/Behavioral: Positive for sleep disturbance. Negative for agitation and behavioral problems.   Objective:  BP (!) 151/72   Pulse 75   Temp 98.3 F (36.8 C) (Oral)   Ht 5\' 5"  (1.651 m)   Wt 132 lb (59.9 kg)   LMP  (LMP Unknown)   SpO2 97%   BMI 21.97 kg/m   BP/Weight 03/02/2017 8/65/7846 03/09/2951  Systolic BP 841 324 401  Diastolic BP 72 68 43  Wt. (Lbs) 132 128 -  BMI 21.97 23.41 -      Physical Exam Constitutional: Patient appears well-developed and well-nourished. No distress. 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 Slightly tender left pretrageal region.Eustachian tube with cerumen bilaterally, normal TM., normal oropharynx  CVS: Normal rate rate, regular rhythm, S1/S2 +, no murmurs, no gallops, no carotid bruit.  Pulmonary: Effort and breath sounds normal, no stridor, rhonchi, wheezes, rales.  Abdominal: Soft. BS +,  no distension, + slight lower abdominal tenderness, no rebound or guarding.  Musculoskeletal: Left brachiocephalic AV fistula - occluded Lymphadenopathy: No lymphadenopathy noted, cervical, inguinal or axillary Neuro: Alert. Normal reflexes, muscle tone coordination. No cranial nerve deficit. Skin: Skin is warm and dry. No rash noted. Not diaphoretic. No erythema. No pallor. Psychiatric: Normal mood and affect. Behavior, judgment, thought content normal.  Lab Results  Component Value Date   HGBA1C 10.5 01/18/2017    Assessment & Plan:   1. Diabetes mellitus without complication ([place]) Uncontrolled with A1c  of 10.5 Regimen had been addressed at her last visit - POCT glucose (manual entry)  2. Cough Likely from postnasal drip Continue Zyrtec - benzonatate (TESSALON) 100 MG capsule; Take 1 capsule (100 mg total) by mouth 2 (two) times daily as needed for cough.  Dispense: 30 capsule; Refill: 0  3. Left ear pain Advised the use of Depo works will help with cerumen - ciprofloxacin-dexamethasone (CIPRODEX) OTIC suspension; Place 4 drops into the left ear 2 (two) times daily.  Dispense: 7.5 mL; Refill: 0  4. Other insomnia Uncontrolled on trazodone Discussed side effects of Ambien - zolpidem (AMBIEN) 5 MG tablet; Take 1 tablet (5 mg total) by mouth at bedtime as needed for sleep.  Dispense: 30 tablet; Refill: 2  5. Need for influenza vaccination Flu vaccine administered   Meds ordered this encounter  Medications  . ciprofloxacin-dexamethasone (CIPRODEX) OTIC suspension    Sig: Place 4 drops into the left ear 2 (two) times daily.    Dispense:  7.5 mL    Refill:  0  . zolpidem (AMBIEN)  5 MG tablet    Sig: Take 1 tablet (5 mg total) by mouth at bedtime as needed for sleep.    Dispense:  30 tablet    Refill:  2    Discontinue Trazodone  . benzonatate (TESSALON) 100 MG capsule    Sig: Take 1 capsule (100 mg total) by mouth 2 (two) times daily as needed for cough.    Dispense:  30 capsule    Refill:  0    Follow-up: Return for Follow-up of diabetes mellitus, please keep previously scheduled appointment.   Arnoldo Morale MD

## 2017-03-08 ENCOUNTER — Other Ambulatory Visit: Payer: Self-pay | Admitting: Student

## 2017-03-09 ENCOUNTER — Encounter (HOSPITAL_COMMUNITY): Payer: Self-pay | Admitting: Interventional Radiology

## 2017-03-09 ENCOUNTER — Other Ambulatory Visit (HOSPITAL_COMMUNITY): Payer: Self-pay | Admitting: Nephrology

## 2017-03-09 ENCOUNTER — Ambulatory Visit (HOSPITAL_COMMUNITY)
Admission: RE | Admit: 2017-03-09 | Discharge: 2017-03-09 | Disposition: A | Discharge status: Home / Self Care | Payer: Self-pay | Source: Ambulatory Visit | Attending: Nephrology | Admitting: Nephrology

## 2017-03-09 DIAGNOSIS — T82858A Stenosis of vascular prosthetic devices, implants and grafts, initial encounter: Secondary | ICD-10-CM | POA: Insufficient documentation

## 2017-03-09 DIAGNOSIS — N186 End stage renal disease: Secondary | ICD-10-CM

## 2017-03-09 DIAGNOSIS — Y832 Surgical operation with anastomosis, bypass or graft as the cause of abnormal reaction of the patient, or of later complication, without mention of misadventure at the time of the procedure: Secondary | ICD-10-CM | POA: Insufficient documentation

## 2017-03-09 DIAGNOSIS — Z992 Dependence on renal dialysis: Secondary | ICD-10-CM

## 2017-03-09 HISTORY — PX: IR AV DIALY SHUNT INTRO NEEDLE/INTRACATH INITIAL W/PTA/IMG LEFT: IMG6103

## 2017-03-09 MED ORDER — LIDOCAINE HCL (PF) 1 % IJ SOLN
INTRAMUSCULAR | Status: AC
  Filled 2017-03-09: qty 30

## 2017-03-09 MED ORDER — IOPAMIDOL (ISOVUE-300) INJECTION 61%
INTRAVENOUS | Status: AC
  Administered 2017-03-09: 75 mL
  Filled 2017-03-09: qty 100

## 2017-03-09 MED ORDER — LIDOCAINE HCL (PF) 1 % IJ SOLN
INTRAMUSCULAR | Status: DC | PRN
  Administered 2017-03-09: 5 mL

## 2017-03-19 MED FILL — ?DULOXETINE HCL DR 60 MG CA: 60 MG | 30 days supply | Qty: 30 | Fill #2

## 2017-03-19 MED FILL — GABAPENTIN 300 MG CAPSULE: 300 | 3 days supply | Qty: 60 | Fill #1

## 2017-03-26 MED FILL — VALSARTAN 160 MG TABLET: 160 | 30 days supply | Qty: 30 | Fill #2

## 2017-03-27 ENCOUNTER — Encounter: Payer: Self-pay | Admitting: Physician Assistant

## 2017-03-27 ENCOUNTER — Ambulatory Visit: Payer: Self-pay | Attending: Family Medicine | Admitting: Physician Assistant

## 2017-03-27 VITALS — BP 157/57 | HR 72 | Temp 97.7°F | Resp 18 | Ht 60.0 in | Wt 134.0 lb

## 2017-03-27 DIAGNOSIS — I10 Essential (primary) hypertension: Secondary | ICD-10-CM | POA: Insufficient documentation

## 2017-03-27 DIAGNOSIS — E1140 Type 2 diabetes mellitus with diabetic neuropathy, unspecified: Secondary | ICD-10-CM | POA: Insufficient documentation

## 2017-03-27 DIAGNOSIS — E785 Hyperlipidemia, unspecified: Secondary | ICD-10-CM | POA: Insufficient documentation

## 2017-03-27 DIAGNOSIS — E079 Disorder of thyroid, unspecified: Secondary | ICD-10-CM | POA: Insufficient documentation

## 2017-03-27 DIAGNOSIS — Z79899 Other long term (current) drug therapy: Secondary | ICD-10-CM | POA: Insufficient documentation

## 2017-03-27 DIAGNOSIS — N3001 Acute cystitis with hematuria: Secondary | ICD-10-CM | POA: Insufficient documentation

## 2017-03-27 DIAGNOSIS — E119 Type 2 diabetes mellitus without complications: Secondary | ICD-10-CM

## 2017-03-27 LAB — POCT URINALYSIS DIPSTICK
GLUCOSE UA: NEGATIVE
Nitrite, UA: POSITIVE
PH UA: 6 (ref 5.0–8.0)
Protein, UA: >=300
SPEC GRAV UA: 1.025 (ref 1.010–1.025)
UROBILINOGEN UA: 0.2 U/dL

## 2017-03-27 LAB — GLUCOSE, POCT (MANUAL RESULT ENTRY): POC GLUCOSE: 123 mg/dL — AB (ref 70–99)

## 2017-03-27 LAB — POCT GLYCOSYLATED HEMOGLOBIN (HGB A1C): Hemoglobin A1C: 9.1

## 2017-03-27 MED ORDER — FLUCONAZOLE 150 MG PO TABS: 150.0000 mg | ORAL_TABLET | Freq: Once | ORAL | 0 refills | Status: AC

## 2017-03-27 MED ORDER — CEFTRIAXONE SODIUM 1 G IJ SOLR
0.5000 g | Freq: Once | INTRAMUSCULAR | Status: AC
  Administered 2017-03-27: 0.5 g via INTRAMUSCULAR

## 2017-03-27 MED ORDER — CEPHALEXIN 500 MG PO CAPS: 500.0000 mg | ORAL_CAPSULE | Freq: Three times a day (TID) | ORAL | 0 refills | Status: DC

## 2017-03-27 MED FILL — FLUCONAZOLE 150 MG TABLET: 150 | 1 days supply | Qty: 1 | Fill #0

## 2017-03-27 MED FILL — CEPHALEXIN 500 MG CAPSULE: 500 | 7 days supply | Qty: 21 | Fill #0

## 2017-03-27 NOTE — Progress Notes (Signed)
Patient ID: Sarah Kelley, female   DOB: 1951/06/05, 66 y.o.   MRN: [id number]   Sarah Kelley, is a 66 y.o. female  HAL:[id number]  [id number]:[id number]  DOB - 01-01-1951  Subjective:  Chief Complaint and HPI: Sarah Kelley is a 66 y.o. female here Sarah Kelley translating with [employer].   Pain and blood with urination X 2 weeks. No f/c.  She had a UTI several weeks ago by history.  Nothing in Epic and they don't know what antibiotic she was given or for how long. +lower abdominal pain.  No flank/back pain.  No N/V/D.    Blood sugars for last 3 weeks- 94 to just over 300.  Checking sugars about 6 times/week-Dialysis-check sugar 3 times/week and about 3 times/week at home.  Her 2 daughters are with her.  They have only been giving her 8-10 units of lantus bid bc they were worried that the dose is too high.    They are also concerned and think her BP may be running too low.  They do not have any outside readings today.  No syncopal episodes.  ROS:   Constitutional:  No f/c, No night sweats, No unexplained weight loss. EENT:  No vision changes, No blurry vision, No hearing changes. No mouth, throat, or ear problems.  Respiratory: No cough, No SOB Cardiac: No CP, no palpitations GI:  No abd pain, No N/V/D. GU: No Urinary s/sx Musculoskeletal: No joint pain Neuro: No headache, no dizziness, no motor weakness.  Skin: No rash Endocrine:  No polydipsia. No polyuria.  Psych: Denies SI/HI  No problems updated.  ALLERGIES: Allergies  Allergen Reactions  . No Known Allergies     PAST MEDICAL HISTORY: Past Medical History:  Diagnosis Date  . Diabetes mellitus   . Hyperlipidemia   . Hypertension   . Neuropathy    Diabetic  . Pneumonia 07/2016  . Renal disorder   . Thyroid disease     MEDICATIONS AT HOME: Prior to Admission medications   Medication Sig Start Date End Date Taking? Authorizing Provider  atorvastatin (LIPITOR) 40 MG tablet Take 1  tablet (40 mg total) by mouth daily. 01/18/17   Arnoldo Morale, MD  benzonatate (TESSALON) 100 MG capsule Take 1 capsule (100 mg total) by mouth 2 (two) times daily as needed for cough. 03/02/17   Arnoldo Morale, MD  calcium acetate (PHOSLO) 667 MG capsule Take 2 capsules (1,334 mg total) by mouth 3 (three) times daily with meals. Patient not taking: Reported on 10/06/2016 08/04/16   Mendel Corning, MD  cetirizine (ZYRTEC) 10 MG tablet Take 1 tablet (10 mg total) by mouth daily. 01/18/17   Arnoldo Morale, MD  ciprofloxacin-dexamethasone (CIPRODEX) OTIC suspension Place 4 drops into the left ear 2 (two) times daily. 03/02/17   Arnoldo Morale, MD  DULoxetine (CYMBALTA) 60 MG capsule Take 1 capsule (60 mg total) by mouth daily. 01/18/17   Arnoldo Morale, MD  gabapentin (NEURONTIN) 300 MG capsule Take 2 capsules (600 mg total) by mouth at bedtime. 01/18/17   Arnoldo Morale, MD  LANTUS 100 UNIT/ML injection Inject 0.15 mLs (15 Units total) into the skin 2 (two) times daily. 01/18/17   Arnoldo Morale, MD  olopatadine (PATANOL) 0.1 % ophthalmic solution Place 1 drop into both eyes 2 (two) times daily. 10/11/16   Arnoldo Morale, MD  valsartan (DIOVAN) 160 MG tablet Take 1 tablet (160 mg total) by mouth daily. 01/18/17   Arnoldo Morale, MD  zolpidem (AMBIEN) 5 MG tablet Take 1  tablet (5 mg total) by mouth at bedtime as needed for sleep. 03/02/17   Arnoldo Morale, MD     Objective:  EXAM:   Vitals:   03/27/17 0907  BP: (!) 157/57  Pulse: 72  Resp: 18  Temp: 97.7 F (36.5 C)  TempSrc: Oral  SpO2: 98%  Weight: 134 lb (60.8 kg)  Height: 5' (1.524 m)    General appearance : A&OX3. NAD. Non-toxic-appearing HEENT: Atraumatic and Normocephalic.  PERRLA. EOM intact.  TM clear B. Mouth-MMM, post pharynx WNL w/o erythema, No PND. Neck: supple, no JVD. No cervical lymphadenopathy. No thyromegaly Chest/Lungs:  Breathing-non-labored, Good air entry bilaterally, breath sounds normal without rales, rhonchi, or wheezing  CVS: S1  S2 regular, no murmurs, gallops, rubs  Abdomen: Bowel sounds present, Non tender and not distended with no gaurding, rigidity or rebound. Extremities: Bilateral Lower Ext shows no edema, both legs are warm to touch with = pulse throughout Neurology:  CN II-XII grossly intact, Non focal.   Psych:  TP linear. J/I WNL. Normal speech. Appropriate eye contact and affect.  Skin:  No Rash  Data Review Lab Results  Component Value Date   HGBA1C 9.1 03/27/2017   HGBA1C 10.5 01/18/2017   HGBA1C 7.4 10/11/2016     Assessment & Plan   1. Diabetes mellitus without complication (HCC) Uncontrolled.  Compliance imperative. - Urinalysis Dipstick - Glucose (CBG) - POCT glycosylated hemoglobin (Hb A1C) Check blood sugars fasting and at bedtime and record.  Bring to next visit.  As long as she is eating regularly, I have encouraged them to adhere to Lantus 15 units BID.  Only hold doses if there is hypoglycemia  2. Acute cystitis with hematuria Keflex and diflucan. 500mg  IM Rocephin now.  I&O cath performed with aseptic technique by Carilyn Goodpasture, RN - Urine Culture  3. Essential hypertension suboptimal control- Compliance imperative.   Check BP daily and record and bring to next visit.    Spent 45 mins face to face with patient and her daughters with diabetes education, UTI prevention, and BP monitoring. Patient in office >2 hours attempting to urinate and requiring I&O cath.   She may end up requiring inpatient treatment.  Patient have been counseled extensively about nutrition and exercise  Return in about 4 weeks (around 04/24/2017) for Dr. Jarold Song; DM and htn f/up.  The patient was given clear instructions to go to ER or return to medical center if symptoms don't improve, worsen or new problems develop. The patient verbalized understanding. The patient was told to call to get lab results if they haven't heard anything in the next week.     Freeman Caldron, PA-C [facility] [employer] [place], [place]   03/27/2017, 9:33 AM

## 2017-03-27 NOTE — Patient Instructions (Addendum)
Check blood pressure daily and record and bring to your next visit.    Check blood sugars fasting and at bedtime and record and bring to next visit.   Pielonefritis en los adultos (Pyelonephritis, Adult) La pielonefritis es una infeccin del rin. Los riones son los rganos que filtran la sangre y [employer] residuos del torrente sanguneo a travs de la orina. La orina pasa desde los riones, a travs de los urteres, Water engineer la vejiga. Hay dos tipos principales de pielonefritis:  Infecciones que se inician rpidamente sin sntomas previos (pielonefritis aguda).  Infecciones que persisten durante un perodo prolongado (pielonefritis crnica). En la [employer], la infeccin desaparece con el tratamiento y no causa otros problemas. Las infecciones ms graves o crnicas a veces pueden propagarse al torrente sanguneo u ocasionar otros problemas en los riones. CAUSAS Por lo general, entre las causas de esta afeccin, se incluyen las siguientes:  Bacterias que pasan desde la vejiga al rin a travs de la orina infectada. La orina de la vejiga puede infectarse por bacterias relacionadas con estas causas: ? Infeccin en la vejiga (cistitis). ? Inflamacin de la prstata (prostatitis). ? Relaciones sexuales en las mujeres.  Bacterias que pasan del torrente sanguneo al rin. FACTORES DE RIESGO Es ms probable que esta afeccin se manifieste en:  Las embarazadas.  Las personas de edad avanzada.  Los diabticos.  Las personas que tienen clculos en los riones o la vejiga.  Las personas que tienen otras anomalas en el rin o los urteres.  Las personas que tienen una sonda vesical.  Las personas con Hotel manager.  Las personas que son sexualmente activas.  Las mujeres que usan espermicidas.  Las personas que han tenido una infeccin previa en las vas [place]. SNTOMAS Los sntomas de esta afeccin incluyen lo siguiente:  Ganas frecuentes de Garment/textile technologist.  Necesidad  intensa o persistente de Garment/textile technologist.  Sensacin de ardor o escozor al [employer].  Dolor abdominal.  Dolor de espalda.  Dolor al costado del cuerpo o en la fosa lumbar.  Cristy Hilts.  Escalofros.  Sangre en la [place] u [place].  Nuseas.  Vmitos. DIAGNSTICO Esta afeccin se puede diagnosticar en funcin de lo siguiente:  Examen fsico e historia clnica.  Anlisis de [place].  Anlisis de [place]. Tambin pueden [employer] de diagnstico por imgenes de los riones, por [place], una ecografa o una tomografa computarizada. TRATAMIENTO El tratamiento de esta afeccin puede depender de la gravedad de la infeccin.  Si la infeccin es leve y se detecta rpidamente, pueden administrarle antibiticos por va oral. Deber tomar lquido para permanecer hidratado.  Si la infeccin es ms grave, es posible que deban hospitalizarlo para administrarle antibiticos directamente en una vena a travs de una va intravenosa (IV). Quizs tambin deban administrarle lquidos a travs de una va intravenosa si no se encuentra bien hidratado. Despus de la hospitalizacin, es posible que deba tomar antibiticos durante un [place]. Podrn prescribirle otros tratamientos segn la causa de la infeccin. [place] los medicamentos de venta libre y los recetados solamente como se lo haya indicado el mdico.  Si le recetaron un antibitico, tmelo como se lo haya indicado el mdico. No deje de tomar los antibiticos aunque comience a Sports administrator. Instrucciones generales  Beba suficiente lquido para mantener la orina clara o de color amarillo plido.  Evite la cafena, el t y las bebidas gaseosas. Estas sustancias irritan la vejiga.  Orine con frecuencia. Evite retener la Bennie Hind  durante largos perodos.  Orine antes y despus [place].  Despus de defecar, las mujeres deben higienizarse la regin perineal desde adelante  hacia atrs. Use cada trozo de papel higinico solo una vez.  Concurra a todas las visitas de control como se lo haya indicado el mdico. Esto es importante. SOLICITE ATENCIN MDICA SI:  Los sntomas no mejoran despus de 2das de tratamiento.  Los sntomas empeoran.  Tiene fiebre. SOLICITE ATENCIN MDICA DE INMEDIATO SI:  No puede tomar los antibiticos ni ingerir lquidos.  Comienza a sentir escalofros.  Vomita.  Siente un dolor intenso en la espalda o en la fosa lumbar.  Se desmaya o siente una debilidad extrema. Esta informacin no tiene [name] el consejo del mdico. Asegrese de hacerle al mdico cualquier pregunta que tenga. Document Released: 03/29/2005 Document Revised: 03/10/2015 Document Reviewed: 10/12/2014 Elsevier Interactive Patient Education  [employer].

## 2017-03-29 LAB — URINE CULTURE

## 2017-03-29 MED FILL — ?CETIRIZINE HCL 10 MG TABLE: 10 | 30 days supply | Qty: 30 | Fill #2

## 2017-03-30 ENCOUNTER — Ambulatory Visit: Payer: Self-pay | Admitting: Vascular Surgery

## 2017-03-30 ENCOUNTER — Inpatient Hospital Stay (HOSPITAL_COMMUNITY): Admission: RE | Admit: 2017-03-30 | Payer: Self-pay | Source: Ambulatory Visit

## 2017-04-03 ENCOUNTER — Telehealth: Payer: Self-pay | Admitting: *Deleted

## 2017-04-03 NOTE — Telephone Encounter (Signed)
-----   Message from [place] Donovan, [place] sent at 04/02/2017  4:12 PM EDT ----- Please call patient.  The urine culture shows that the antibiotics we used to treat her should be working to take care of the infection. Follow up as planned. Thanks, Freeman Caldron, PA-C

## 2017-04-03 NOTE — Telephone Encounter (Signed)
Medical Assistant used [employer] to contact patient.  Interpreter Name: Tor Netters #: [id number] Patient was not available, [employer] left patient a voicemail. Voicemail states to give a call back to [place] with [facility] at [phone].

## 2017-04-10 MED FILL — GABAPENTIN 300 MG CAPSULE: 300 | 30 days supply | Qty: 60 | Fill #2

## 2017-04-16 ENCOUNTER — Encounter (HOSPITAL_COMMUNITY): Payer: Self-pay | Admitting: Emergency Medicine

## 2017-04-16 ENCOUNTER — Other Ambulatory Visit: Payer: Self-pay

## 2017-04-16 DIAGNOSIS — N186 End stage renal disease: Secondary | ICD-10-CM | POA: Insufficient documentation

## 2017-04-16 DIAGNOSIS — G44209 Tension-type headache, unspecified, not intractable: Secondary | ICD-10-CM | POA: Insufficient documentation

## 2017-04-16 DIAGNOSIS — I120 Hypertensive chronic kidney disease with stage 5 chronic kidney disease or end stage renal disease: Secondary | ICD-10-CM | POA: Insufficient documentation

## 2017-04-16 DIAGNOSIS — E1122 Type 2 diabetes mellitus with diabetic chronic kidney disease: Secondary | ICD-10-CM | POA: Insufficient documentation

## 2017-04-16 DIAGNOSIS — E1140 Type 2 diabetes mellitus with diabetic neuropathy, unspecified: Secondary | ICD-10-CM | POA: Insufficient documentation

## 2017-04-16 DIAGNOSIS — Z794 Long term (current) use of insulin: Secondary | ICD-10-CM | POA: Insufficient documentation

## 2017-04-16 DIAGNOSIS — R42 Dizziness and giddiness: Secondary | ICD-10-CM | POA: Insufficient documentation

## 2017-04-16 DIAGNOSIS — Z79899 Other long term (current) drug therapy: Secondary | ICD-10-CM | POA: Insufficient documentation

## 2017-04-16 DIAGNOSIS — E1165 Type 2 diabetes mellitus with hyperglycemia: Secondary | ICD-10-CM | POA: Insufficient documentation

## 2017-04-16 LAB — BASIC METABOLIC PANEL
ANION GAP: 15 (ref 5–15)
BUN: 83 mg/dL — ABNORMAL HIGH (ref 6–20)
CALCIUM: 9 mg/dL (ref 8.9–10.3)
CHLORIDE: 94 mmol/L — AB (ref 101–111)
CO2: 24 mmol/L (ref 22–32)
CREATININE: 7.66 mg/dL — AB (ref 0.44–1.00)
GFR calc non Af Amer: 5 mL/min — ABNORMAL LOW (ref >60)
GFR, EST AFRICAN AMERICAN: 6 mL/min — AB (ref >60)
Glucose, Bld: 129 mg/dL — ABNORMAL HIGH (ref 65–99)
Potassium: 4.5 mmol/L (ref 3.5–5.1)
SODIUM: 133 mmol/L — AB (ref 135–145)

## 2017-04-16 LAB — CBC
HCT: 36.5 % (ref 36.0–46.0)
Hemoglobin: 11.9 g/dL — ABNORMAL LOW (ref 12.0–15.0)
MCH: 31.1 pg (ref 26.0–34.0)
MCHC: 32.6 g/dL (ref 30.0–36.0)
MCV: 95.3 fL (ref 78.0–100.0)
PLATELETS: 126 10*3/uL — AB (ref 150–400)
RBC: 3.83 MIL/uL — AB (ref 3.87–5.11)
RDW: 14.7 % (ref 11.5–15.5)
WBC: 5 10*3/uL (ref 4.0–10.5)

## 2017-04-16 NOTE — ED Triage Notes (Signed)
Pt presents to ED for assessment of dizziness, headache, weakness, and syncope (without falling).  No neuro deficits noted at triage.  Pt c/o intermittent SOB.  Denies n/v/d.  Pt does dialysis mwsaturday, went today from 12-4.  Pt's family member states she has had high systolic BPs and very low diastolic.

## 2017-04-17 ENCOUNTER — Emergency Department (HOSPITAL_COMMUNITY)
Admission: EM | Admit: 2017-04-17 | Discharge: 2017-04-17 | Disposition: A | Discharge status: Home / Self Care | Payer: Self-pay | Attending: Emergency Medicine | Admitting: Emergency Medicine

## 2017-04-17 ENCOUNTER — Emergency Department (HOSPITAL_COMMUNITY): Payer: Self-pay

## 2017-04-17 DIAGNOSIS — R42 Dizziness and giddiness: Secondary | ICD-10-CM

## 2017-04-17 DIAGNOSIS — G44209 Tension-type headache, unspecified, not intractable: Secondary | ICD-10-CM

## 2017-04-17 MED ORDER — ONDANSETRON HCL 4 MG/2ML IJ SOLN
4.0000 mg | Freq: Once | INTRAMUSCULAR | Status: AC
  Administered 2017-04-17: 4 mg via INTRAVENOUS
  Filled 2017-04-17: qty 2

## 2017-04-17 MED ORDER — MECLIZINE HCL 25 MG PO TABS
25.0000 mg | ORAL_TABLET | Freq: Once | ORAL | Status: AC
  Administered 2017-04-17: 25 mg via ORAL
  Filled 2017-04-17: qty 1

## 2017-04-17 MED ORDER — MECLIZINE HCL 12.5 MG PO TABS: 12.5000 mg | ORAL_TABLET | Freq: Three times a day (TID) | ORAL | 0 refills | Status: DC | PRN

## 2017-04-17 NOTE — Discharge Instructions (Signed)
You were seen in the ED today with dizziness. The MRI was normal. Go to dialysis today and follow up with your doctor regarding any new or worsening symptoms.

## 2017-04-17 NOTE — ED Provider Notes (Signed)
Blood pressure (!) 171/63, pulse 65, temperature 98.2 F (36.8 C), resp. rate 11, SpO2 98 %.  Assuming care from Dr. Dina Rich.  In short, Sarah Kelley is a 66 y.o. female with a chief complaint of Dizziness and Headache .  Refer to the original H&P for additional details.  The current plan of care is to follow MRI and reassess.   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 No acute intracranial abnormality.      Electronically Signed  By: Genevie Ann M.D.  On: 04/17/2017 08:25   Patient reevaluated. No acute infarct on MRI. Plan for dialysis today and primary care physician follow-up. Provided meclizine for symptomatically relief.  Nanda Quinton, MD   Margette Fast, MD 04/17/17 [phone]

## 2017-04-17 NOTE — ED Provider Notes (Signed)
[facility] EMERGENCY DEPARTMENT Provider Note   CSN: [id number] Arrival date & time: 04/16/17  2224     History   Chief Complaint Chief Complaint  Patient presents with  . Dizziness  . Headache    HPI Sarah Kelley is a 66 y.o. female.   Dizziness  Associated symptoms: headaches   Associated symptoms: no chest pain, no nausea, no shortness of breath, no vomiting and no weakness   Headache   Pertinent negatives include no fever, no shortness of breath, no nausea and no vomiting.    This is a 66 year old female with a history of diabetes, hypertension, hyperlipidemia, end-stage renal disease on dialysis Tuesday, Thursday, and Saturday who presents with generalized weakness and dizziness. Family is at the bedside. They report that as she approaches her dialysis time she typically will feel dizzy and sometimes gets headaches. However, today she has felt weak all day long with room spinning dizziness and headache. She is due for dialysis later today. No recent fevers or illnesses. She denies chest pain, nausea, vomiting, abdominal pain, shortness of breath. No recent fevers or neck pain. Currently she rates her headache at 3 out of 10.  Past Medical History:  Diagnosis Date  . Diabetes mellitus   . Hyperlipidemia   . Hypertension   . Neuropathy    Diabetic  . Pneumonia 07/2016  . Renal disorder   . Thyroid disease     Patient Active Problem List   Diagnosis Date Noted  . Insomnia 01/18/2017  . Transient alteration of awareness   . Goals of care, counseling/discussion   . Oropharyngeal dysphagia 12/04/2016  . UTI (urinary tract infection) 12/04/2016  . Severe sepsis ([place]) 12/02/2016  . Hyperglycemia 12/02/2016  . Acute encephalopathy 07/28/2016  . CAP (community acquired pneumonia) 07/28/2016  . Pneumonia 07/28/2016  . ESRD (end stage renal disease) ([place])   . Phantom pain ([place]) 09/13/2015  . Hyperlipidemia 06/01/2015  . Toe amputation  status ([place])   . Insulin-requiring or dependent type II diabetes mellitus ([place])   . Chronic kidney disease (CKD) stage G4/A1, severely decreased glomerular filtration rate (GFR) between 15-29 mL/min/1.73 square meter and albuminuria creatinine ratio less than 30 mg/g ([facility]) 05/18/2015  . Diabetic infection of left foot ([place]) 05/18/2015  . Osteomyelitis ([place]) 05/18/2015  . Abdominal pain 05/18/2015  . Osteomyelitis of left foot ([place]) 05/18/2015  . Diabetic neuropathy ([place]) 01/14/2007  . Diabetes mellitus without complication ([place]) 19/37/9024  . HLD (hyperlipidemia) 01/14/2007  . Normocytic anemia 01/14/2007  . ANXIETY 01/14/2007  . Depression 01/14/2007  . Essential hypertension 01/14/2007    Past Surgical History:  Procedure Laterality Date  . AMPUTATION Left 05/21/2015   Procedure: Left Foot 3rd Ray Amputation;  Surgeon: Newt Minion, MD;  Location: [facility];  Service: Orthopedics;  Laterality: Left;  . AV FISTULA PLACEMENT Left 03/28/2016   Procedure: ARTERIOVENOUS (AV) FISTULA CREATION LEFT ARM;  Surgeon: Waynetta Sandy, MD;  Location: [facility];  Service: Vascular;  Laterality: Left;  . DILATION AND CURETTAGE OF UTERUS    . FISTULA SUPERFICIALIZATION Left 09/04/2016   Procedure: FISTULA SUPERFICIALIZATION LEFT UPPER ARM;  Surgeon: Waynetta Sandy, MD;  Location: [facility];  Service: Vascular;  Laterality: Left;  . IR AV DIALY SHUNT INTRO NEEDLE/INTRACATH INITIAL W/PTA/IMG LEFT  11/10/2016  . IR AV DIALY SHUNT INTRO NEEDLE/INTRACATH INITIAL W/PTA/IMG LEFT  03/09/2017  . IR GENERIC HISTORICAL  08/01/2016   IR FLUORO GUIDE CV LINE RIGHT 08/01/2016 [name], MD [facility]  . IR GENERIC HISTORICAL  08/01/2016   IR US GUIDE VASC ACCESS RIGHT 08/01/2016 [name], MD [facility]  . IR REMOVAL TUN CV CATH W/O FL  11/01/2016  . IR US GUIDE VASC ACCESS LEFT  11/10/2016    OB History    No data available       Home Medications    Prior to Admission medications   Medication Sig  Start Date End Date Taking? Authorizing Provider  atorvastatin (LIPITOR) 40 MG tablet Take 1 tablet (40 mg total) by mouth daily. 01/18/17   Arnoldo Morale, MD  benzonatate (TESSALON) 100 MG capsule Take 1 capsule (100 mg total) by mouth 2 (two) times daily as needed for cough. 03/02/17   Arnoldo Morale, MD  calcium acetate (PHOSLO) 667 MG capsule Take 2 capsules (1,334 mg total) by mouth 3 (three) times daily with meals. Patient not taking: Reported on 10/06/2016 08/04/16   Rai, Vernelle Emerald, MD  cephALEXin (KEFLEX) 500 MG capsule Take 1 capsule (500 mg total) by mouth 3 (three) times daily. 03/27/17   Argentina Donovan, PA-C  cetirizine (ZYRTEC) 10 MG tablet Take 1 tablet (10 mg total) by mouth daily. 01/18/17   Arnoldo Morale, MD  ciprofloxacin-dexamethasone (CIPRODEX) OTIC suspension Place 4 drops into the left ear 2 (two) times daily. 03/02/17   Arnoldo Morale, MD  DULoxetine (CYMBALTA) 60 MG capsule Take 1 capsule (60 mg total) by mouth daily. 01/18/17   Arnoldo Morale, MD  gabapentin (NEURONTIN) 300 MG capsule Take 2 capsules (600 mg total) by mouth at bedtime. 01/18/17   Arnoldo Morale, MD  LANTUS 100 UNIT/ML injection Inject 0.15 mLs (15 Units total) into the skin 2 (two) times daily. 01/18/17   Arnoldo Morale, MD  olopatadine (PATANOL) 0.1 % ophthalmic solution Place 1 drop into both eyes 2 (two) times daily. 10/11/16   Arnoldo Morale, MD  valsartan (DIOVAN) 160 MG tablet Take 1 tablet (160 mg total) by mouth daily. 01/18/17   Arnoldo Morale, MD  zolpidem (AMBIEN) 5 MG tablet Take 1 tablet (5 mg total) by mouth at bedtime as needed for sleep. 03/02/17   Arnoldo Morale, MD    Family History History reviewed. No pertinent family history.  Social History Social History  Substance Use Topics  . Smoking status: Never Smoker  . Smokeless tobacco: Never Used  . Alcohol use No     Comment: rare- a beer every now and then     Allergies   No known allergies   Review of Systems Review of Systems    Constitutional: Negative for fever.  Eyes: Negative for visual disturbance.  Respiratory: Negative for shortness of breath.   Cardiovascular: Negative for chest pain.  Gastrointestinal: Negative for abdominal pain, nausea and vomiting.  Neurological: Positive for dizziness and headaches. Negative for speech difficulty, weakness and numbness.  All other systems reviewed and are negative.    Physical Exam Updated Vital Signs BP (!) 164/60 (BP Location: Left Arm)   Pulse 69   Temp 98.1 F (36.7 C) (Oral)   Resp 16   LMP  (LMP Unknown)   SpO2 100%   Physical Exam  Constitutional: She is oriented to person, place, and time. No distress.  Elderly, nontoxic appearing, no acute distress  HENT:  Head: Normocephalic and atraumatic.  Eyes: Pupils are equal, round, and reactive to light.  Neck: Normal range of motion. Neck supple.  Cardiovascular: Normal rate, regular rhythm and normal heart sounds.   No murmur heard. Fistula left  upper extremity with positive thrill  Pulmonary/Chest: Effort normal and breath sounds normal. No respiratory distress. She has no wheezes.  Abdominal: Soft. Bowel sounds are normal. There is no tenderness.  Musculoskeletal:  5 out of 5 strength all 4 extremities, cranial nerves II through XII intact, no dysmetria to finger-nose-finger  Neurological: She is alert and oriented to person, place, and time.  Skin: Skin is warm and dry.  Psychiatric: She has a normal mood and affect.  Nursing note and vitals reviewed.    ED Treatments / Results  Labs (all labs ordered are listed, but only abnormal results are displayed) Labs Reviewed  BASIC METABOLIC PANEL - Abnormal; Notable for the following:       Result Value   Sodium 133 (*)    Chloride 94 (*)    Glucose, Bld 129 (*)    BUN 83 (*)    Creatinine, Ser 7.66 (*)    GFR calc non Af Amer 5 (*)    GFR calc Af Amer 6 (*)    All other components within normal limits  CBC - Abnormal; Notable for the  following:    RBC 3.83 (*)    Hemoglobin 11.9 (*)    Platelets 126 (*)    All other components within normal limits  URINALYSIS, ROUTINE W REFLEX MICROSCOPIC    EKG  EKG Interpretation  Date/Time:  Monday April 16 2017 22:41:13 EDT Ventricular Rate:  69 PR Interval:  136 QRS Duration: 128 QT Interval:  456 QTC Calculation: 488 R Axis:   -53 Text Interpretation:  Normal sinus rhythm Right bundle branch block Left anterior fascicular block Bifascicular block Moderate voltage criteria for LVH, may be normal variant Abnormal ECG Confirmed by Thayer Jew [phone]) on 04/17/2017 3:53:05 AM       Radiology No results found.  Procedures Procedures (including critical care time)  Medications Ordered in ED Medications  meclizine (ANTIVERT) tablet 25 mg (not administered)  ondansetron (ZOFRAN) injection 4 mg (not administered)     Initial Impression / Assessment and Plan / ED Course  I have reviewed the triage vital signs and the nursing notes.  Pertinent labs & imaging results that were available during my care of the patient were reviewed by me and considered in my medical decision making (see chart for details).     Patient presents with dizziness and headache. Overall nontoxic-appearing.  She appears neurologically intact however, on finger nose finger, she does overshoot. However, there may also be a language barrier. Basic labwork obtained. Will obtain MRI given patient's age and complaints of room spinning dizziness to rule out central cause. Patient was given meclizine.  Pending MRI at sign out.  Final Clinical Impressions(s) / ED Diagnoses   Final diagnoses:  Dizziness  Tension-type headache, not intractable, unspecified chronicity pattern    New Prescriptions New Prescriptions   No medications on file     Merryl Hacker, MD 04/20/17 1611

## 2017-04-18 MED FILL — ?ATORVASTATIN 40MG TABLET: 40 | 30 days supply | Qty: 30 | Fill #2

## 2017-04-23 ENCOUNTER — Ambulatory Visit: Payer: Self-pay | Attending: Family Medicine | Admitting: Family Medicine

## 2017-04-23 ENCOUNTER — Encounter: Payer: Self-pay | Admitting: Family Medicine

## 2017-04-23 VITALS — BP 171/78 | HR 66 | Temp 98.0°F | Ht 60.0 in | Wt 137.8 lb

## 2017-04-23 DIAGNOSIS — E119 Type 2 diabetes mellitus without complications: Secondary | ICD-10-CM

## 2017-04-23 DIAGNOSIS — E1122 Type 2 diabetes mellitus with diabetic chronic kidney disease: Secondary | ICD-10-CM | POA: Insufficient documentation

## 2017-04-23 DIAGNOSIS — Z794 Long term (current) use of insulin: Secondary | ICD-10-CM | POA: Insufficient documentation

## 2017-04-23 DIAGNOSIS — Z992 Dependence on renal dialysis: Secondary | ICD-10-CM | POA: Insufficient documentation

## 2017-04-23 DIAGNOSIS — Z79899 Other long term (current) drug therapy: Secondary | ICD-10-CM | POA: Insufficient documentation

## 2017-04-23 DIAGNOSIS — I120 Hypertensive chronic kidney disease with stage 5 chronic kidney disease or end stage renal disease: Secondary | ICD-10-CM | POA: Insufficient documentation

## 2017-04-23 DIAGNOSIS — N186 End stage renal disease: Secondary | ICD-10-CM

## 2017-04-23 DIAGNOSIS — I10 Essential (primary) hypertension: Secondary | ICD-10-CM

## 2017-04-23 LAB — GLUCOSE, POCT (MANUAL RESULT ENTRY): POC Glucose: 95 mg/dl (ref 70–99)

## 2017-04-23 NOTE — Progress Notes (Signed)
Subjective:  Patient ID: Sarah Kelley, female    DOB: 09/21/1950  Age: 66 y.o. 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  MRI was ordered with no acute findings and she was subsequently discharged to continue with hemodialysis schedule and for follow-up with PCP.  Today she informs me she has lightheadedness on getting up from a seated position but this has been infrequent compared to when she presented to the ED. She denies headaches.  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 No acute intracranial abnormality  Her blood pressure is elevated and she endorses compliance with antihypertensives; her blood pressure is usually normal as per her daughter during dialysis and she has been advised not to take her antihypertensives on days she is scheduled for hemodialysis.  Past Medical History:  Diagnosis Date  . Diabetes mellitus   . Hyperlipidemia   . Hypertension   . Neuropathy    Diabetic  . Pneumonia 07/2016  . Renal disorder   . Thyroid disease     Past Surgical History:  Procedure Laterality Date  . AMPUTATION Left 05/21/2015   Procedure: Left Foot 3rd Ray Amputation;  Surgeon: Newt Minion, MD;  Location: [facility];  Service: Orthopedics;  Laterality: Left;  . AV FISTULA PLACEMENT Left 03/28/2016   Procedure: ARTERIOVENOUS (AV) FISTULA CREATION LEFT ARM;  Surgeon: Waynetta Sandy, MD;  Location: [facility];  Service: Vascular;   Laterality: Left;  . DILATION AND CURETTAGE OF UTERUS    . FISTULA SUPERFICIALIZATION Left 09/04/2016   Procedure: FISTULA SUPERFICIALIZATION LEFT UPPER ARM;  Surgeon: Waynetta Sandy, MD;  Location: [facility];  Service: Vascular;  Laterality: Left;  . IR AV DIALY SHUNT INTRO NEEDLE/INTRACATH INITIAL W/PTA/IMG LEFT  11/10/2016  . IR AV DIALY SHUNT INTRO NEEDLE/INTRACATH INITIAL W/PTA/IMG LEFT  03/09/2017  . IR GENERIC HISTORICAL  08/01/2016   IR FLUORO GUIDE CV LINE RIGHT 08/01/2016 [name], MD [facility]  . IR GENERIC HISTORICAL  08/01/2016   IR US GUIDE VASC ACCESS RIGHT 08/01/2016 [name], MD [facility]  . IR REMOVAL TUN CV CATH W/O FL  11/01/2016  . IR US GUIDE VASC ACCESS LEFT  11/10/2016    Allergies  Allergen Reactions  . No Known Allergies      Outpatient Medications Prior to Visit  Medication Sig Dispense Refill  . atorvastatin (LIPITOR) 40 MG tablet Take 1 tablet (40 mg total) by mouth daily. 30 tablet 5  . cephALEXin (KEFLEX) 500 MG capsule Take 1 capsule (500 mg total) by mouth 3 (three) times daily. 21 capsule 0  . cetirizine (ZYRTEC) 10 MG tablet Take 1 tablet (10 mg total) by mouth daily. 30 tablet 5  . ciprofloxacin-dexamethasone (CIPRODEX) OTIC suspension Place 4 drops into the left ear 2 (two) times daily. 7.5 mL 0  . DULoxetine (CYMBALTA) 60 MG capsule Take 1 capsule (60 mg total) by mouth daily. 30 capsule 5  . gabapentin (NEURONTIN) 300 MG capsule Take 2 capsules (600 mg total) by mouth  at bedtime. 60 capsule 5  . LANTUS 100 UNIT/ML injection Inject 0.15 mLs (15 Units total) into the skin 2 (two) times daily. 30 mL 5  . meclizine (ANTIVERT) 12.5 MG tablet Take 1 tablet (12.5 mg total) by mouth 3 (three) times daily as needed for dizziness. 30 tablet 0  . olopatadine (PATANOL) 0.1 % ophthalmic solution Place 1 drop into both eyes 2 (two) times daily. 5 mL 2  . valsartan (DIOVAN) 160 MG tablet Take 1 tablet (160 mg total) by mouth daily. 30 tablet 5  . zolpidem  (AMBIEN) 5 MG tablet Take 1 tablet (5 mg total) by mouth at bedtime as needed for sleep. 30 tablet 2  . benzonatate (TESSALON) 100 MG capsule Take 1 capsule (100 mg total) by mouth 2 (two) times daily as needed for cough. (Patient not taking: Reported on 04/23/2017) 30 capsule 0  . calcium acetate (PHOSLO) 667 MG capsule Take 2 capsules (1,334 mg total) by mouth 3 (three) times daily with meals. (Patient not taking: Reported on 10/06/2016) 180 capsule 3   No facility-administered medications prior to visit.     ROS Review of Systems  Constitutional: Negative for activity change, appetite change and fatigue.  HENT: Negative for congestion, sinus pressure and sore throat.   Eyes: Negative for visual disturbance.  Respiratory: Negative for cough, chest tightness, shortness of breath and wheezing.   Cardiovascular: Negative for chest pain and palpitations.  Gastrointestinal: Negative for abdominal distention, abdominal pain and constipation.  Endocrine: Negative for polydipsia.  Genitourinary: Negative for dysuria and frequency.  Musculoskeletal: Negative for arthralgias and back pain.  Skin: Negative for rash.  Neurological: Positive for light-headedness. Negative for tremors and numbness.  Hematological: Does not bruise/bleed easily.  Psychiatric/Behavioral: Negative for agitation and behavioral problems.    Objective:  BP (!) 171/78   Pulse 66   Temp 98 F (36.7 C) (Oral)   Ht 5' (1.524 m)   Wt 137 lb 12.8 oz (62.5 kg)   LMP  (LMP Unknown)   SpO2 95%   BMI 26.91 kg/m   BP/Weight 04/23/2017 04/17/2017 12/22/2977  Systolic BP 892 119 417  Diastolic BP 78 67 57  Wt. (Lbs) 137.8 - 134  BMI 26.91 - 26.17      Physical Exam  Constitutional: She is oriented to person, place, and time. She appears well-developed and well-nourished.  Cardiovascular: Normal rate, normal heart sounds and intact distal pulses.   No murmur heard. Pulmonary/Chest: Effort normal and breath sounds  normal. She has no wheezes. She has no rales. She exhibits no tenderness.  Abdominal: Soft. Bowel sounds are normal. She exhibits no distension and no mass. There is no tenderness.  Musculoskeletal: Normal range of motion.  Left brachiocephalic AV fistula with palpable thrill  Neurological: She is alert and oriented to person, place, and time.  Psychiatric: She has a normal mood and affect.       Lab Results  Component Value Date   HGBA1C 9.1 03/27/2017    Assessment & Plan:   1. Diabetes mellitus without complication ([place]) Uncontrolled with A1c of 9.1 Continue Lantus 15 units twice daily Diabetic diet, lifestyle modifications - POCT glucose (manual entry)  2. Essential hypertension Blood pressure is elevated today Due to complaints of orthostatic hypotension and normalization of blood pressure with dialysis and we'll make no changes to her regimen  3. ESRD (end stage renal disease) ([place]) Continue hemodialysis as per schedule   No orders of the defined types were placed in this  encounter.   Follow-up: Return in about 3 months (around 07/24/2017) for follow up of Diabetes.   Arnoldo Morale MD

## 2017-04-23 NOTE — Patient Instructions (Signed)
Diabetes Mellitus and Food It is important for you to manage your blood sugar (glucose) level. Your blood glucose level can be greatly affected by what you eat. Eating healthier foods in the appropriate amounts throughout the day at about the same time each day will help you control your blood glucose level. It can also help slow or prevent worsening of your diabetes mellitus. Healthy eating may even help you improve the level of your blood pressure and reach or maintain a healthy weight. General recommendations for healthful eating and cooking habits include:  Eating meals and snacks regularly. Avoid going long periods of time without eating to lose weight.  Eating a diet that consists mainly of plant-based foods, such as fruits, vegetables, nuts, legumes, and whole grains.  Using low-heat cooking methods, such as baking, instead of high-heat cooking methods, such as deep frying.  Work with your dietitian to make sure you understand how to use the Nutrition Facts information on food labels. How can food affect me? Carbohydrates Carbohydrates affect your blood glucose level more than any other type of food. Your dietitian will help you determine how many carbohydrates to eat at each meal and teach you how to count carbohydrates. Counting carbohydrates is important to keep your blood glucose at a healthy level, especially if you are using insulin or taking certain medicines for diabetes mellitus. Alcohol Alcohol can cause sudden decreases in blood glucose (hypoglycemia), especially if you use insulin or take certain medicines for diabetes mellitus. Hypoglycemia can be a life-threatening condition. Symptoms of hypoglycemia (sleepiness, dizziness, and disorientation) are similar to symptoms of having too much alcohol. If your health care provider has given you approval to drink alcohol, do so in moderation and use the following guidelines:  Women should not have more than one drink per day, and men  should not have more than two drinks per day. One drink is equal to: ? 12 oz of beer. ? 5 oz of wine. ? 1 oz of hard liquor.  Do not drink on an empty stomach.  Keep yourself hydrated. Have water, diet soda, or unsweetened iced tea.  Regular soda, juice, and other mixers might contain a lot of carbohydrates and should be counted.  What foods are not recommended? As you make food choices, it is important to remember that all foods are not the same. Some foods have fewer nutrients per serving than other foods, even though they might have the same number of calories or carbohydrates. It is difficult to get your body what it needs when you eat foods with fewer nutrients. Examples of foods that you should avoid that are high in calories and carbohydrates but low in nutrients include:  Trans fats (most processed foods list trans fats on the Nutrition Facts label).  Regular soda.  Juice.  Candy.  Sweets, such as cake, pie, doughnuts, and cookies.  Fried foods.  What foods can I eat? Eat nutrient-rich foods, which will nourish your body and keep you healthy. The food you should eat also will depend on several factors, including:  The calories you need.  The medicines you take.  Your weight.  Your blood glucose level.  Your blood pressure level.  Your cholesterol level.  You should eat a variety of foods, including:  Protein. ? Lean cuts of meat. ? Proteins low in saturated fats, such as fish, egg whites, and beans. Avoid processed meats.  Fruits and vegetables. ? Fruits and vegetables that may help control blood glucose levels, such as apples,   mangoes, and yams.  Dairy products. ? Choose fat-free or low-fat dairy products, such as milk, yogurt, and cheese.  Grains, bread, pasta, and rice. ? Choose whole grain products, such as multigrain bread, whole oats, and brown rice. These foods may help control blood pressure.  Fats. ? Foods containing healthful fats, such as  nuts, avocado, olive oil, canola oil, and fish.  Does everyone with diabetes mellitus have the same meal plan? Because every person with diabetes mellitus is different, there is not one meal plan that works for everyone. It is very important that you meet with a dietitian who will help you create a meal plan that is just right for you. This information is not intended to replace advice given to you by your health care provider. Make sure you discuss any questions you have with your health care provider. Document Released: 03/16/2005 Document Revised: 11/25/2015 Document Reviewed: 05/16/2013 Elsevier Interactive Patient Education  2017 [employer]  

## 2017-04-25 ENCOUNTER — Ambulatory Visit: Payer: Self-pay | Admitting: Vascular Surgery

## 2017-04-25 ENCOUNTER — Other Ambulatory Visit (HOSPITAL_COMMUNITY): Payer: Self-pay

## 2017-05-04 MED FILL — VALSARTAN 160 MG TABLET: 160 | 30 days supply | Qty: 30 | Fill #3

## 2017-05-04 MED FILL — ?DULOXETINE HCL DR 60 MG CA: 60 MG | 30 days supply | Qty: 30 | Fill #3

## 2017-05-09 MED FILL — GABAPENTIN 300 MG CAPSULE: 300 | 30 days supply | Qty: 60 | Fill #3

## 2017-05-09 MED FILL — ?CETIRIZINE HCL 10 MG TABLE: 10 | 30 days supply | Qty: 30 | Fill #3

## 2017-05-28 MED FILL — ?DULOXETINE HCL DR 60 MG CA: 60 MG | 30 days supply | Qty: 30 | Fill #4

## 2017-05-28 MED FILL — ?ATORVASTATIN 40MG TABLET: 40 | 30 days supply | Qty: 30 | Fill #3

## 2017-06-07 MED FILL — ?CETIRIZINE HCL 10 MG TABLE: 10 | 30 days supply | Qty: 30 | Fill #4

## 2017-06-07 MED FILL — GABAPENTIN 300 MG CAPSULE: 300 | 30 days supply | Qty: 60 | Fill #4

## 2017-06-08 ENCOUNTER — Other Ambulatory Visit (HOSPITAL_COMMUNITY): Payer: Self-pay | Admitting: Nephrology

## 2017-06-08 DIAGNOSIS — N186 End stage renal disease: Secondary | ICD-10-CM

## 2017-06-12 ENCOUNTER — Other Ambulatory Visit: Payer: Self-pay | Admitting: General Surgery

## 2017-06-13 ENCOUNTER — Encounter (HOSPITAL_COMMUNITY): Payer: Self-pay | Admitting: Diagnostic Radiology

## 2017-06-13 ENCOUNTER — Ambulatory Visit (HOSPITAL_COMMUNITY)
Admission: RE | Admit: 2017-06-13 | Discharge: 2017-06-13 | Disposition: A | Discharge status: Home / Self Care | Payer: Self-pay | Source: Ambulatory Visit | Attending: Nephrology | Admitting: Nephrology

## 2017-06-13 ENCOUNTER — Other Ambulatory Visit (HOSPITAL_COMMUNITY): Payer: Self-pay | Admitting: Nephrology

## 2017-06-13 DIAGNOSIS — N186 End stage renal disease: Secondary | ICD-10-CM

## 2017-06-13 DIAGNOSIS — E079 Disorder of thyroid, unspecified: Secondary | ICD-10-CM | POA: Insufficient documentation

## 2017-06-13 DIAGNOSIS — Y832 Surgical operation with anastomosis, bypass or graft as the cause of abnormal reaction of the patient, or of later complication, without mention of misadventure at the time of the procedure: Secondary | ICD-10-CM | POA: Insufficient documentation

## 2017-06-13 DIAGNOSIS — E785 Hyperlipidemia, unspecified: Secondary | ICD-10-CM | POA: Insufficient documentation

## 2017-06-13 DIAGNOSIS — E1140 Type 2 diabetes mellitus with diabetic neuropathy, unspecified: Secondary | ICD-10-CM | POA: Insufficient documentation

## 2017-06-13 DIAGNOSIS — T82858A Stenosis of vascular prosthetic devices, implants and grafts, initial encounter: Secondary | ICD-10-CM | POA: Insufficient documentation

## 2017-06-13 DIAGNOSIS — Z794 Long term (current) use of insulin: Secondary | ICD-10-CM | POA: Insufficient documentation

## 2017-06-13 DIAGNOSIS — I120 Hypertensive chronic kidney disease with stage 5 chronic kidney disease or end stage renal disease: Secondary | ICD-10-CM | POA: Insufficient documentation

## 2017-06-13 DIAGNOSIS — E1122 Type 2 diabetes mellitus with diabetic chronic kidney disease: Secondary | ICD-10-CM | POA: Insufficient documentation

## 2017-06-13 HISTORY — PX: IR AV DIALY SHUNT INTRO NEEDLE/INTRAC INITIAL W/PTA/STENT/IMG LT: IMG6104

## 2017-06-13 MED ORDER — LIDOCAINE HCL (PF) 1 % IJ SOLN
INTRAMUSCULAR | Status: AC
  Filled 2017-06-13: qty 30

## 2017-06-13 MED ORDER — LIDOCAINE HCL (PF) 1 % IJ SOLN
INTRAMUSCULAR | Status: DC | PRN
  Administered 2017-06-13: 10 mL

## 2017-06-13 MED ORDER — IOPAMIDOL (ISOVUE-300) INJECTION 61%
INTRAVENOUS | Status: AC
  Administered 2017-06-13: 80 mL
  Filled 2017-06-13: qty 100

## 2017-06-13 MED ORDER — IOPAMIDOL (ISOVUE-300) INJECTION 61%
INTRAVENOUS | Status: AC
  Administered 2017-06-13: 15 mL
  Filled 2017-06-13: qty 50

## 2017-06-13 NOTE — H&P (Signed)
Chief Complaint: Patient was seen in consultation today for renal failure  Referring Physician(s): Coladonato,Joseph  Supervising Physician: Markus Daft  Patient Status: [facility] - Out-pt  History of Present Illness: Sarah Kelley is a 66 y.o. female with past medical history of DM, HLD, HTN, pneumonia who presents with difficulty cannulating her UE fistula.  IR consulted for fistulogram and possible angioplasty vs. Stenting.   Patient presents in her usual state of health.  She does have some some baseline mental status changes.   Her family is contacted for consent using Spanish interpreter.  She has been NPO.  She does not take blood thinners.   Past Medical History:  Diagnosis Date  . Diabetes mellitus   . Hyperlipidemia   . Hypertension   . Neuropathy    Diabetic  . Pneumonia 07/2016  . Renal disorder   . Thyroid disease     Past Surgical History:  Procedure Laterality Date  . AMPUTATION Left 05/21/2015   Procedure: Left Foot 3rd Ray Amputation;  Surgeon: Newt Minion, MD;  Location: [place];  Service: Orthopedics;  Laterality: Left;  . AV FISTULA PLACEMENT Left 03/28/2016   Procedure: ARTERIOVENOUS (AV) FISTULA CREATION LEFT ARM;  Surgeon: Waynetta Sandy, MD;  Location: [place];  Service: Vascular;  Laterality: Left;  . DILATION AND CURETTAGE OF UTERUS    . FISTULA SUPERFICIALIZATION Left 09/04/2016   Procedure: FISTULA SUPERFICIALIZATION LEFT UPPER ARM;  Surgeon: Waynetta Sandy, MD;  Location: [place];  Service: Vascular;  Laterality: Left;  . IR AV DIALY SHUNT INTRO NEEDLE/INTRACATH INITIAL W/PTA/IMG LEFT  11/10/2016  . IR AV DIALY SHUNT INTRO NEEDLE/INTRACATH INITIAL W/PTA/IMG LEFT  03/09/2017  . IR GENERIC HISTORICAL  08/01/2016   IR FLUORO GUIDE CV LINE RIGHT 08/01/2016 [name], MD [facility]-INTER[facility]  . IR GENERIC HISTORICAL  08/01/2016   IR US GUIDE VASC ACCESS RIGHT 08/01/2016 [name], MD [facility]  . IR REMOVAL TUN CV CATH W/O FL   11/01/2016  . IR US GUIDE VASC ACCESS LEFT  11/10/2016    Allergies: No known allergies  Medications: Prior to Admission medications   Medication Sig Start Date End Date Taking? Authorizing Provider  atorvastatin (LIPITOR) 40 MG tablet Take 1 tablet (40 mg total) by mouth daily. 01/18/17   Arnoldo Morale, MD  benzonatate (TESSALON) 100 MG capsule Take 1 capsule (100 mg total) by mouth 2 (two) times daily as needed for cough. Patient not taking: Reported on 04/23/2017 03/02/17   Arnoldo Morale, MD  calcium acetate (PHOSLO) 667 MG capsule Take 2 capsules (1,334 mg total) by mouth 3 (three) times daily with meals. Patient not taking: Reported on 10/06/2016 08/04/16   Rai, Vernelle Emerald, MD  cephALEXin (KEFLEX) 500 MG capsule Take 1 capsule (500 mg total) by mouth 3 (three) times daily. 03/27/17   Argentina Donovan, PA-C  cetirizine (ZYRTEC) 10 MG tablet Take 1 tablet (10 mg total) by mouth daily. 01/18/17   Arnoldo Morale, MD  ciprofloxacin-dexamethasone (CIPRODEX) OTIC suspension Place 4 drops into the left ear 2 (two) times daily. 03/02/17   Arnoldo Morale, MD  DULoxetine (CYMBALTA) 60 MG capsule Take 1 capsule (60 mg total) by mouth daily. 01/18/17   Arnoldo Morale, MD  gabapentin (NEURONTIN) 300 MG capsule Take 2 capsules (600 mg total) by mouth at bedtime. 01/18/17   Arnoldo Morale, MD  LANTUS 100 UNIT/ML injection Inject 0.15 mLs (15 Units total) into the skin 2 (two) times daily. 01/18/17   Arnoldo Morale, MD  meclizine (ANTIVERT) 12.5 MG tablet Take 1 tablet (12.5 mg total) by mouth 3 (three) times daily as needed for dizziness. 04/17/17   Long, Wonda Olds, MD  olopatadine (PATANOL) 0.1 % ophthalmic solution Place 1 drop into both eyes 2 (two) times daily. 10/11/16   Arnoldo Morale, MD  valsartan (DIOVAN) 160 MG tablet Take 1 tablet (160 mg total) by mouth daily. 01/18/17   Arnoldo Morale, MD  zolpidem (AMBIEN) 5 MG tablet Take 1 tablet (5 mg total) by mouth at bedtime as needed for sleep. 03/02/17   Arnoldo Morale, MD       No family history on file.  Social History   Socioeconomic History  . Marital status: Married    Spouse name: Not on file  . Number of children: Not on file  . Years of education: Not on file  . Highest education level: Not on file  Social Needs  . Financial resource strain: Not on file  . Food insecurity - worry: Not on file  . Food insecurity - inability: Not on file  . Transportation needs - medical: Not on file  . Transportation needs - non-medical: Not on file  Occupational History  . Not on file  Tobacco Use  . Smoking status: Never Smoker  . Smokeless tobacco: Never Used  Substance and Sexual Activity  . Alcohol use: No    Alcohol/week: 0.0 oz    Comment: rare- a beer every now and then  . Drug use: No  . Sexual activity: Not Currently  Other Topics Concern  . Not on file  Social History Narrative  . Not on file   Review of Systems  Constitutional: Negative for fatigue and fever.  Respiratory: Negative for cough and shortness of breath.   Cardiovascular: Negative for chest pain.  Gastrointestinal: Negative for abdominal pain.  Psychiatric/Behavioral: Negative for behavioral problems and confusion.    Vital Signs: LMP  (LMP Unknown)   Physical Exam  Constitutional: She appears well-developed.  Cardiovascular: Normal rate.  Pulmonary/Chest: Effort normal. No respiratory distress.  Musculoskeletal:  Left UE fistula in place.   Neurological: She is alert.  Skin: Skin is warm and dry.  Nursing note and vitals reviewed.   Imaging: No results found.  Labs:  CBC: Recent Labs    12/02/16 1237 12/03/16 0526 12/04/16 0337 04/16/17 2254  WBC 23.8* 22.1* 18.8* 5.0  HGB 12.4 10.4* 10.9* 11.9*  HCT 39.1 33.0* 34.6* 36.5  PLT 274 228 236 126*    COAGS: Recent Labs    07/28/16 1427  INR 1.36    BMP: Recent Labs    12/04/16 0337 12/05/16 0546 12/06/16 0643 04/16/17 2254  NA 135 126* 134* 133*  K 4.5 3.9 3.6 4.5  CL 95* 88* 95* 94*   CO2 28 24 26 24   GLUCOSE 129* 161* 122* 129*  BUN 42* 52* 21* 83*  CALCIUM 8.8* 8.2* 8.5* 9.0  CREATININE 5.83* 6.54* 4.34* 7.66*  GFRNONAA 7* 6* 10* 5*  GFRAA 8* 7* 11* 6*    LIVER FUNCTION TESTS: Recent Labs    07/28/16 1032 07/29/16 1612  07/31/16 0529 08/01/16 0843 08/02/16 0626 08/03/16 0418 12/02/16 1408  BILITOT 0.6  --   --  0.6  --   --   --  0.8  AST 19  --   --  25  --   --   --  19  ALT 13* 12*  --  14  --   --   --  13*  ALKPHOS 129*  --   --  107  --   --   --  154*  PROT 5.4*  --   --  5.3*  --   --   --  6.5  ALBUMIN 2.0*  --    < > 1.6*  1.5* 1.3* 1.4* 1.5* 2.1*   < > = values in this interval not displayed.    TUMOR MARKERS: No results for input(s): AFPTM, CEA, CA199, CHROMGRNA in the last 8760 hours.  Assessment and Plan: Renal failure Patient with past medical history of renal failure and left UE fistula presents with complaint of difficulty cannulating.  IR consulted for fistulogram and possible intervention at the request of Dr. Arty Baumgartner.  Risks and benefits discussed with the patient's family using Spanish interpreter including, but not limited to bleeding, infection, vascular injury, pulmonary embolism, need for tunneled HD catheter placement or even death. All questions were answered, family is agreeable to proceed. Consent signed and in chart.  Thank you for this interesting consult.  I greatly enjoyed meeting Sarah Kelley and look forward to participating in their care.  A copy of this report was sent to the requesting provider on this date.  Electronically Signed: Docia Barrier, PA 06/13/2017, 2:13 PM   I spent a total of 40 Minutes    in face to face in clinical consultation, greater than 50% of which was counseling/coordinating care for renal failure.

## 2017-06-13 NOTE — Procedures (Signed)
  Pre-operative Diagnosis: Venous stenosis       Post-operative Diagnosis: Multifocal venous stenosis   Indications: Difficulty cannulating  Procedure: Left UE fistulogram.  Balloon PTA of left cephalic vein at 5 different locations.  Placement of covered stent near cephalic arch due to recurrent stenosis and focal dissection.    Findings: Multifocal stenoses in left cephalic vein.  Stenoses treated with 6 x 40 mm Mustang balloon.  Focal dissection and residual stenosis (63-14%) at the cephalic arch after PTA.  Cephalic arch lesion treated with covered FLAIR stent (7 x 40 mm) and post dilated with 6 mm balloon.  Complications: None     EBL: Minimal  Plan: Remove suture tomorrow at dialysis.  Fistula is ready for use.

## 2017-07-04 MED FILL — ?CETIRIZINE HCL 10 MG TABLE: 10 | 30 days supply | Qty: 30 | Fill #5

## 2017-07-04 MED FILL — ?ATORVASTATIN 40MG TABLET: 40 | 30 days supply | Qty: 30 | Fill #4

## 2017-07-04 MED FILL — ?DULOXETINE HCL DR 60 MG CA: 60 MG | 30 days supply | Qty: 30 | Fill #5

## 2017-07-04 MED FILL — VALSARTAN 160 MG TABLET: 160 | 30 days supply | Qty: 30 | Fill #4

## 2017-07-17 MED FILL — GABAPENTIN 300 MG CAPSULE: 300 | 30 days supply | Qty: 60 | Fill #5

## 2017-07-25 ENCOUNTER — Ambulatory Visit: Payer: Self-pay | Attending: Family Medicine | Admitting: Family Medicine

## 2017-07-25 ENCOUNTER — Encounter: Payer: Self-pay | Admitting: Family Medicine

## 2017-07-25 VITALS — BP 159/81 | HR 68 | Temp 98.1°F | Ht 60.0 in | Wt 144.2 lb

## 2017-07-25 DIAGNOSIS — E1122 Type 2 diabetes mellitus with diabetic chronic kidney disease: Secondary | ICD-10-CM | POA: Insufficient documentation

## 2017-07-25 DIAGNOSIS — E1149 Type 2 diabetes mellitus with other diabetic neurological complication: Secondary | ICD-10-CM

## 2017-07-25 DIAGNOSIS — N186 End stage renal disease: Secondary | ICD-10-CM | POA: Insufficient documentation

## 2017-07-25 DIAGNOSIS — E119 Type 2 diabetes mellitus without complications: Secondary | ICD-10-CM

## 2017-07-25 DIAGNOSIS — E11319 Type 2 diabetes mellitus with unspecified diabetic retinopathy without macular edema: Secondary | ICD-10-CM | POA: Insufficient documentation

## 2017-07-25 DIAGNOSIS — E1165 Type 2 diabetes mellitus with hyperglycemia: Secondary | ICD-10-CM | POA: Insufficient documentation

## 2017-07-25 DIAGNOSIS — Z794 Long term (current) use of insulin: Secondary | ICD-10-CM

## 2017-07-25 DIAGNOSIS — Z1239 Encounter for other screening for malignant neoplasm of breast: Secondary | ICD-10-CM

## 2017-07-25 DIAGNOSIS — E7800 Pure hypercholesterolemia, unspecified: Secondary | ICD-10-CM

## 2017-07-25 DIAGNOSIS — Z79899 Other long term (current) drug therapy: Secondary | ICD-10-CM | POA: Insufficient documentation

## 2017-07-25 DIAGNOSIS — Z992 Dependence on renal dialysis: Secondary | ICD-10-CM | POA: Insufficient documentation

## 2017-07-25 DIAGNOSIS — M62838 Other muscle spasm: Secondary | ICD-10-CM

## 2017-07-25 DIAGNOSIS — I10 Essential (primary) hypertension: Secondary | ICD-10-CM

## 2017-07-25 DIAGNOSIS — I120 Hypertensive chronic kidney disease with stage 5 chronic kidney disease or end stage renal disease: Secondary | ICD-10-CM | POA: Insufficient documentation

## 2017-07-25 DIAGNOSIS — E785 Hyperlipidemia, unspecified: Secondary | ICD-10-CM | POA: Insufficient documentation

## 2017-07-25 DIAGNOSIS — E079 Disorder of thyroid, unspecified: Secondary | ICD-10-CM | POA: Insufficient documentation

## 2017-07-25 DIAGNOSIS — Z1231 Encounter for screening mammogram for malignant neoplasm of breast: Secondary | ICD-10-CM

## 2017-07-25 LAB — POCT GLYCOSYLATED HEMOGLOBIN (HGB A1C): HEMOGLOBIN A1C: 8.2

## 2017-07-25 LAB — GLUCOSE, POCT (MANUAL RESULT ENTRY): POC GLUCOSE: 277 mg/dL — AB (ref 70–99)

## 2017-07-25 MED ORDER — TRUEPLUS LANCETS 28G MISC: 1.0000 | Freq: Three times a day (TID) | 12 refills | Status: DC

## 2017-07-25 MED ORDER — "INSULIN SYRINGE-NEEDLE U-100 31G X 15/64"" 0.5 ML MISC": 1.0000 | Freq: Every day | 5 refills | Status: DC

## 2017-07-25 MED ORDER — INSULIN GLARGINE 100 UNIT/ML ~~LOC~~ SOLN: 15.0000 [IU] | Freq: Two times a day (BID) | SUBCUTANEOUS | 5 refills | Status: DC

## 2017-07-25 MED ORDER — GABAPENTIN 300 MG PO CAPS: 600.0000 mg | ORAL_CAPSULE | Freq: Every day | ORAL | 5 refills | Status: DC

## 2017-07-25 MED ORDER — TRUE METRIX METER DEVI
1.0000 | Freq: Three times a day (TID) | 0 refills | Status: DC
Start: 2017-07-25 — End: 2022-02-19

## 2017-07-25 MED ORDER — VALSARTAN 160 MG PO TABS: 160.0000 mg | ORAL_TABLET | Freq: Every day | ORAL | 5 refills | Status: DC

## 2017-07-25 MED ORDER — METHOCARBAMOL 750 MG PO TABS: 750.0000 mg | ORAL_TABLET | Freq: Two times a day (BID) | ORAL | 1 refills | Status: DC | PRN

## 2017-07-25 MED ORDER — DULOXETINE HCL 60 MG PO CPEP: 60.0000 mg | ORAL_CAPSULE | Freq: Every day | ORAL | 5 refills | Status: DC

## 2017-07-25 MED ORDER — ATORVASTATIN CALCIUM 40 MG PO TABS: 40.0000 mg | ORAL_TABLET | Freq: Every day | ORAL | 5 refills | Status: DC

## 2017-07-25 MED ORDER — GLUCOSE BLOOD VI STRP: ORAL_STRIP | 12 refills | Status: DC

## 2017-07-25 MED FILL — ?ATORVASTATIN 40MG TABLET: 40 | 30 days supply | Qty: 30 | Fill #0

## 2017-07-25 MED FILL — TRUE METRIX TEST STRIP: 30 days supply | Qty: 100 | Fill #0

## 2017-07-25 MED FILL — TRUEplus LANCETS 28G MISC: 30 days supply | Qty: 100 | Fill #0

## 2017-07-25 MED FILL — !TRUE METRIX BLOOD GLUCOSE: 1 days supply | Qty: 1 | Fill #0

## 2017-07-25 MED FILL — METHOCARBAMOL 750 MG TABS: 750 | 30 days supply | Qty: 60 | Fill #0

## 2017-07-25 MED FILL — ?DULOXETINE HCL DR 60 MG CA: 60 MG | 30 days supply | Qty: 30 | Fill #0

## 2017-07-25 MED FILL — VALSARTAN 160 MG TABLET: 160 | 30 days supply | Qty: 30 | Fill #0

## 2017-07-25 MED FILL — !LANTUS 100 UNITS/ML VIAL: 100 | 28 days supply | Qty: 10 | Fill #0

## 2017-07-25 NOTE — Patient Instructions (Signed)

## 2017-07-25 NOTE — Progress Notes (Signed)
Subjective:  Patient ID: Sarah Kelley, female    DOB: [birth year]  Age: 67 y.o. 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  Her A1c is 8.2 and has improved significantly from 9.1 previously.  She endorses  compliance with medications and denies hypoglycemia.  Her neuropathy is controlled on gabapentin. She has diabetic retinopathy which is followed by [facility] ophthalmology.  She has been compliant with her antihypertensives and her statin and denies adverse effects from medications. She complains of left-sided neck pain which she describes as moderate for an unknown duration and denies a history of trauma. She did have an episode of vomiting 2 days ago but this has resolved as per her daughter. She has no acute concerns today.  Past Medical History:  Diagnosis Date  . Diabetes mellitus   . Hyperlipidemia   . Hypertension   . Neuropathy    Diabetic  . Pneumonia 07/2016  . Renal disorder   . Thyroid disease     Past Surgical History:  Procedure Laterality Date  . AMPUTATION Left 05/21/2015   Procedure: Left Foot 3rd Ray Amputation;  Surgeon: Newt Minion, MD;  Location: [place];  Service: Orthopedics;  Laterality: Left;  . AV FISTULA PLACEMENT Left 03/28/2016   Procedure: ARTERIOVENOUS (AV) FISTULA CREATION LEFT ARM;  Surgeon: Waynetta Sandy, MD;  Location: [place];  Service: Vascular;  Laterality: Left;  . DILATION AND CURETTAGE OF UTERUS    . FISTULA SUPERFICIALIZATION Left 09/04/2016   Procedure: FISTULA SUPERFICIALIZATION LEFT UPPER ARM;  Surgeon: Waynetta Sandy, MD;  Location: [place];  Service: Vascular;  Laterality: Left;  . IR AV DIALY SHUNT INTRO NEEDLE/INTRAC INITIAL W/PTA/STENT/IMG LT Left 06/13/2017  . IR AV DIALY SHUNT INTRO NEEDLE/INTRACATH INITIAL W/PTA/IMG LEFT  11/10/2016  . IR AV DIALY SHUNT INTRO NEEDLE/INTRACATH INITIAL W/PTA/IMG LEFT  03/09/2017  . IR GENERIC HISTORICAL  08/01/2016   IR FLUORO GUIDE CV LINE RIGHT 08/01/2016 [name], MD [facility]  . IR GENERIC HISTORICAL  08/01/2016   IR US GUIDE VASC ACCESS RIGHT 08/01/2016 [name], MD [facility]  . IR REMOVAL TUN CV CATH W/O FL  11/01/2016  . IR US GUIDE VASC ACCESS LEFT  11/10/2016    Allergies  Allergen Reactions  . No Known Allergies      Outpatient Medications Prior to Visit  Medication Sig Dispense Refill  . cetirizine (ZYRTEC) 10 MG tablet Take 1 tablet (10 mg total) by mouth daily. 30 tablet 5  . olopatadine (PATANOL) 0.1 % ophthalmic solution Place 1 drop into both eyes 2 (two) times daily. 5 mL 2  . atorvastatin (LIPITOR) 40 MG tablet Take 1 tablet (40 mg total) by mouth daily. 30 tablet 5  . ciprofloxacin-dexamethasone (CIPRODEX) OTIC suspension Place 4 drops into the left ear 2 (two) times daily. 7.5 mL 0  . DULoxetine (CYMBALTA) 60 MG capsule Take 1 capsule (60 mg total) by mouth daily. 30 capsule 5  . gabapentin (NEURONTIN) 300 MG capsule Take 2 capsules (600 mg total) by mouth at bedtime. 60 capsule 5  . LANTUS 100 UNIT/ML injection Inject 0.15 mLs (15 Units  total) into the skin 2 (two) times daily. 30 mL 5  . valsartan (DIOVAN) 160 MG tablet Take 1 tablet (160 mg total) by mouth daily. 30 tablet 5  . calcium acetate (PHOSLO) 667 MG capsule Take 2 capsules (1,334 mg total) by mouth 3 (three) times daily with meals. (Patient not taking: Reported on 10/06/2016) 180 capsule 3  . benzonatate (TESSALON) 100 MG capsule Take 1 capsule (100 mg total) by mouth 2 (two) times daily as needed for cough. (Patient not taking: Reported on 04/23/2017) 30 capsule 0   . cephALEXin (KEFLEX) 500 MG capsule Take 1 capsule (500 mg total) by mouth 3 (three) times daily. (Patient not taking: Reported on 07/25/2017) 21 capsule 0  . meclizine (ANTIVERT) 12.5 MG tablet Take 1 tablet (12.5 mg total) by mouth 3 (three) times daily as needed for dizziness. (Patient not taking: Reported on 07/25/2017) 30 tablet 0  . zolpidem (AMBIEN) 5 MG tablet Take 1 tablet (5 mg total) by mouth at bedtime as needed for sleep. (Patient not taking: Reported on 07/25/2017) 30 tablet 2   No facility-administered medications prior to visit.     ROS Review of Systems  Constitutional: Negative for activity change, appetite change and fatigue.  HENT: Negative for congestion, sinus pressure and sore throat.   Eyes: Negative for visual disturbance.  Respiratory: Negative for cough, chest tightness, shortness of breath and wheezing.   Cardiovascular: Negative for chest pain and palpitations.  Gastrointestinal: Negative for abdominal distention, abdominal pain and constipation.  Endocrine: Negative for polydipsia.  Genitourinary: Negative for dysuria and frequency.  Musculoskeletal: Positive for neck pain. Negative for arthralgias and back pain.  Skin: Negative for rash.  Neurological: Negative for tremors, light-headedness and numbness.  Hematological: Does not bruise/bleed easily.  Psychiatric/Behavioral: Negative for agitation and behavioral problems.    Objective:  BP (!) 159/81   Pulse 68   Temp 98.1 F (36.7 C) (Oral)   Ht 5' (1.524 m)   Wt 144 lb 3.2 oz (65.4 kg)   LMP  (LMP Unknown)   SpO2 97%   BMI 28.16 kg/m   BP/Weight 07/25/2017 04/23/2017 93/23/5573  Systolic BP 220 254 270  Diastolic BP 81 78 67  Wt. (Lbs) 144.2 137.8 -  BMI 28.16 26.91 -      Physical Exam  Constitutional: She is oriented to person, place, and time. She appears well-developed and well-nourished.  Cardiovascular: Normal rate and normal heart sounds.  No murmur heard. Unable to palpate  dorsalis pedis bilaterally  Pulmonary/Chest: Effort normal and breath sounds normal. She has no wheezes. She has no rales. She exhibits no tenderness.  Abdominal: Soft. Bowel sounds are normal. She exhibits no distension and no mass. There is no tenderness.  Musculoskeletal: She exhibits tenderness (TTP of left trapezius).  Left brachiocephalic AV fistula with palpable thrill  Neurological: She is alert and oriented to person, place, and time.  Skin: Skin is warm and dry.  Psychiatric: She has a normal mood and affect.     Lab Results  Component Value Date   HGBA1C 8.2 07/25/2017    Assessment & Plan:   1. Other diabetic neurological complication associated with type 2 diabetes mellitus (HCC) Stable - gabapentin (NEURONTIN) 300 MG capsule; Take 2 capsules (600 mg total) by mouth at bedtime.  Dispense: 60 capsule; Refill: 5 - DULoxetine (CYMBALTA) 60 MG capsule; Take 1 capsule (60 mg total) by mouth daily.  Dispense: 30 capsule; Refill: 5  2. Essential hypertension Uncontrolled We will hold off  on adjusting regimen of antihypertensive to prevent hypoglycemia during hemodialysis Counseled on blood pressure goal of less than 130/80, low-sodium, DASH diet, medication compliance, 150 minutes of moderate intensity exercise per week. Discussed medication compliance, adverse effects. - valsartan (DIOVAN) 160 MG tablet; Take 1 tablet (160 mg total) by mouth daily.  Dispense: 30 tablet; Refill: 5  3. Insulin-requiring or dependent type II diabetes mellitus ([place]) Uncontrolled with A1c of 8.2 but this has improved significantly from 9.1 previously Continue current regimen She is at risk of hypoglycemia due to end-stage renal disease Foot exam performed today. Counseled on Diabetic diet, my plate method, 892 minutes of moderate intensity exercise/week Keep blood sugar logs with fasting goals of 80-120 mg/dl, random of less than 180 and in the event of sugars less than 60 mg/dl or greater than  400 mg/dl please notify the clinic ASAP. It is recommended that you undergo annual eye exams and annual foot exams. Pneumonia vaccine is recommended. - POCT glucose (manual entry) - POCT glycosylated hemoglobin (Hb A1C) - methocarbamol (ROBAXIN-750) 750 MG tablet; Take 1 tablet (750 mg total) by mouth 2 (two) times daily as needed for muscle spasms.  Dispense: 60 tablet; Refill: 1 - Blood Glucose Monitoring Suppl (TRUE METRIX METER) DEVI; 1 each by Does not apply route 3 (three) times daily before meals.  Dispense: 1 Device; Refill: 0 - glucose blood (TRUE METRIX BLOOD GLUCOSE TEST) test strip; Use 3 times daily before meals  Dispense: 100 each; Refill: 12 - TRUEPLUS LANCETS 28G MISC; 1 each by Does not apply route 3 (three) times daily before meals.  Dispense: 100 each; Refill: 12 - Insulin Syringe-Needle U-100 (BD INSULIN SYRINGE ULTRAFINE) 31G X 15/64" 0.5 ML MISC; 1 each by Does not apply route daily.  Dispense: 30 each; Refill: 5 - insulin glargine (LANTUS) 100 UNIT/ML injection; Inject 0.15 mLs (15 Units total) into the skin 2 (two) times daily.  Dispense: 30 mL; Refill: 5  4. Pure hypercholesterolemia Stable Low-cholesterol diet - atorvastatin (LIPITOR) 40 MG tablet; Take 1 tablet (40 mg total) by mouth daily.  Dispense: 30 tablet; Refill: 5  5. Screening for breast cancer - MM Digital Screening; Future  6. Muscle spasm Likely secondary to positioning as she spends long hours in her wheelchair or on the dialysis seat - methocarbamol (ROBAXIN-750) 750 MG tablet; Take 1 tablet (750 mg total) by mouth 2 (two) times daily as needed for muscle spasms.  Dispense: 60 tablet; Refill: 1  Healthcare maintenance We will hold off on colonoscopy  due to multiple comorbidities and the fact that the risks far outweigh the benefits.  Meds ordered this encounter  Medications  . methocarbamol (ROBAXIN-750) 750 MG tablet    Sig: Take 1 tablet (750 mg total) by mouth 2 (two) times daily as needed  for muscle spasms.    Dispense:  60 tablet    Refill:  1  . Blood Glucose Monitoring Suppl (TRUE METRIX METER) DEVI    Sig: 1 each by Does not apply route 3 (three) times daily before meals.    Dispense:  1 Device    Refill:  0  . glucose blood (TRUE METRIX BLOOD GLUCOSE TEST) test strip    Sig: Use 3 times daily before meals    Dispense:  100 each    Refill:  12  . TRUEPLUS LANCETS 28G MISC    Sig: 1 each by Does not apply route 3 (three) times daily before meals.    Dispense:  100 each  Refill:  12  . Insulin Syringe-Needle U-100 (BD INSULIN SYRINGE ULTRAFINE) 31G X 15/64" 0.5 ML MISC    Sig: 1 each by Does not apply route daily.    Dispense:  30 each    Refill:  5  . valsartan (DIOVAN) 160 MG tablet    Sig: Take 1 tablet (160 mg total) by mouth daily.    Dispense:  30 tablet    Refill:  5  . insulin glargine (LANTUS) 100 UNIT/ML injection    Sig: Inject 0.15 mLs (15 Units total) into the skin 2 (two) times daily.    Dispense:  30 mL    Refill:  5  . gabapentin (NEURONTIN) 300 MG capsule    Sig: Take 2 capsules (600 mg total) by mouth at bedtime.    Dispense:  60 capsule    Refill:  5  . DULoxetine (CYMBALTA) 60 MG capsule    Sig: Take 1 capsule (60 mg total) by mouth daily.    Dispense:  30 capsule    Refill:  5  . atorvastatin (LIPITOR) 40 MG tablet    Sig: Take 1 tablet (40 mg total) by mouth daily.    Dispense:  30 tablet    Refill:  5    Follow-up: Return in about 3 months (around 10/23/2017) for Follow-up of chronic medical conditions.   Arnoldo Morale MD

## 2017-08-23 ENCOUNTER — Telehealth: Payer: Self-pay | Admitting: Family Medicine

## 2017-08-23 DIAGNOSIS — R0982 Postnasal drip: Secondary | ICD-10-CM

## 2017-08-23 MED ORDER — CETIRIZINE HCL 10 MG PO TABS: 10.0000 mg | ORAL_TABLET | Freq: Every day | ORAL | 2 refills | Status: DC

## 2017-08-23 MED FILL — GABAPENTIN 300 MG CAPSULE: 300 | 30 days supply | Qty: 60 | Fill #0

## 2017-08-23 MED FILL — ?CETIRIZINE HCL 10 MG TABLE: 10 | 30 days supply | Qty: 30 | Fill #0

## 2017-08-23 NOTE — Telephone Encounter (Signed)
Refilled

## 2017-08-23 NOTE — Telephone Encounter (Signed)
Pt. Came to facility requesting a refill on cetirizine (ZYRTEC) 10 MG tablet Pt. Uses [facility]. Please f/u

## 2017-09-05 MED FILL — ATORVASTATIN 40 MG TABLET: 40 | 30 days supply | Qty: 30 | Fill #1

## 2017-09-05 MED FILL — DULoxetine HCL 60 MG CPEP: 60 | 30 days supply | Qty: 30 | Fill #1

## 2017-09-11 ENCOUNTER — Other Ambulatory Visit (HOSPITAL_COMMUNITY): Payer: Self-pay | Admitting: Nephrology

## 2017-09-11 DIAGNOSIS — N186 End stage renal disease: Secondary | ICD-10-CM

## 2017-09-11 MED FILL — ETHYL CHLORIDE SPRAY: 30 days supply | Qty: 104 | Fill #0

## 2017-09-12 ENCOUNTER — Encounter (HOSPITAL_COMMUNITY): Payer: Self-pay | Admitting: Interventional Radiology

## 2017-09-12 ENCOUNTER — Other Ambulatory Visit (HOSPITAL_COMMUNITY): Payer: Self-pay | Admitting: Nephrology

## 2017-09-12 ENCOUNTER — Ambulatory Visit (HOSPITAL_COMMUNITY)
Admission: RE | Admit: 2017-09-12 | Discharge: 2017-09-12 | Disposition: A | Discharge status: Home / Self Care | Payer: Self-pay | Source: Ambulatory Visit | Attending: Nephrology | Admitting: Nephrology

## 2017-09-12 DIAGNOSIS — E1122 Type 2 diabetes mellitus with diabetic chronic kidney disease: Secondary | ICD-10-CM | POA: Insufficient documentation

## 2017-09-12 DIAGNOSIS — T82858A Stenosis of vascular prosthetic devices, implants and grafts, initial encounter: Secondary | ICD-10-CM | POA: Insufficient documentation

## 2017-09-12 DIAGNOSIS — I120 Hypertensive chronic kidney disease with stage 5 chronic kidney disease or end stage renal disease: Secondary | ICD-10-CM | POA: Insufficient documentation

## 2017-09-12 DIAGNOSIS — E079 Disorder of thyroid, unspecified: Secondary | ICD-10-CM | POA: Insufficient documentation

## 2017-09-12 DIAGNOSIS — N186 End stage renal disease: Secondary | ICD-10-CM | POA: Insufficient documentation

## 2017-09-12 DIAGNOSIS — Y832 Surgical operation with anastomosis, bypass or graft as the cause of abnormal reaction of the patient, or of later complication, without mention of misadventure at the time of the procedure: Secondary | ICD-10-CM | POA: Insufficient documentation

## 2017-09-12 DIAGNOSIS — E1140 Type 2 diabetes mellitus with diabetic neuropathy, unspecified: Secondary | ICD-10-CM | POA: Insufficient documentation

## 2017-09-12 DIAGNOSIS — E785 Hyperlipidemia, unspecified: Secondary | ICD-10-CM | POA: Insufficient documentation

## 2017-09-12 DIAGNOSIS — Z794 Long term (current) use of insulin: Secondary | ICD-10-CM | POA: Insufficient documentation

## 2017-09-12 DIAGNOSIS — Z89422 Acquired absence of other left toe(s): Secondary | ICD-10-CM | POA: Insufficient documentation

## 2017-09-12 HISTORY — PX: IR US GUIDE VASC ACCESS LEFT: IMG2389

## 2017-09-12 HISTORY — PX: IR AV DIALY SHUNT INTRO NEEDLE/INTRAC INITIAL W/PTA/STENT/IMG LT: IMG6104

## 2017-09-12 MED ORDER — IOPAMIDOL (ISOVUE-300) INJECTION 61%
INTRAVENOUS | Status: AC
  Filled 2017-09-12: qty 50

## 2017-09-12 MED ORDER — IOPAMIDOL (ISOVUE-300) INJECTION 61%
INTRAVENOUS | Status: AC
  Administered 2017-09-12: 75 mL
  Filled 2017-09-12: qty 100

## 2017-09-12 MED ORDER — LIDOCAINE HCL (PF) 1 % IJ SOLN
INTRAMUSCULAR | Status: DC | PRN
  Administered 2017-09-12: 5 mL

## 2017-09-12 MED ORDER — LIDOCAINE HCL 1 % IJ SOLN
INTRAMUSCULAR | Status: AC
  Filled 2017-09-12: qty 20

## 2017-09-12 NOTE — H&P (Signed)
Chief Complaint: Patient was seen in consultation today for renal failure  Referring Physician(s): Coladonato,Joseph  Supervising Physician: Daryll Brod  Patient Status: [facility] - Out-pt  History of Present Illness: Sarah Kelley is a 67 y.o. female with past medical history of DM, HLD, HTN, diabetic neuropathy, and renal failure on dialysis via left upper arm fistula who presents today with difficulty accessing fistula at dialysis this past Sunday.  She was unable to complete dialysis that day.  IR consulted for fistulagram with possible intervention.  Past Medical History:  Diagnosis Date  . Diabetes mellitus   . Hyperlipidemia   . Hypertension   . Neuropathy    Diabetic  . Pneumonia 07/2016  . Renal disorder   . Thyroid disease     Past Surgical History:  Procedure Laterality Date  . AMPUTATION Left 05/21/2015   Procedure: Left Foot 3rd Ray Amputation;  Surgeon: Newt Minion, MD;  Location: [place];  Service: Orthopedics;  Laterality: Left;  . AV FISTULA PLACEMENT Left 03/28/2016   Procedure: ARTERIOVENOUS (AV) FISTULA CREATION LEFT ARM;  Surgeon: Waynetta Sandy, MD;  Location: [place];  Service: Vascular;  Laterality: Left;  . DILATION AND CURETTAGE OF UTERUS    . FISTULA SUPERFICIALIZATION Left 09/04/2016   Procedure: FISTULA SUPERFICIALIZATION LEFT UPPER ARM;  Surgeon: Waynetta Sandy, MD;  Location: [place];  Service: Vascular;  Laterality: Left;  . IR AV DIALY SHUNT INTRO NEEDLE/INTRAC INITIAL W/PTA/STENT/IMG LT Left 06/13/2017  . IR AV DIALY SHUNT INTRO NEEDLE/INTRACATH INITIAL W/PTA/IMG LEFT  11/10/2016  . IR AV DIALY SHUNT INTRO NEEDLE/INTRACATH INITIAL W/PTA/IMG LEFT  03/09/2017  . IR GENERIC HISTORICAL  08/01/2016   IR FLUORO GUIDE CV LINE RIGHT 08/01/2016 [name], MD [facility]-INTERV RAD  . IR GENERIC HISTORICAL  08/01/2016   IR US GUIDE VASC ACCESS RIGHT 08/01/2016 [name], MD [facility]INTER[facility]  . IR REMOVAL TUN CV CATH W/O FL  11/01/2016  . IR US  GUIDE VASC ACCESS LEFT  11/10/2016    Allergies: No known allergies  Medications: Prior to Admission medications   Medication Sig Start Date End Date Taking? Authorizing Provider  atorvastatin (LIPITOR) 40 MG tablet Take 1 tablet (40 mg total) by mouth daily. 07/25/17   [name], MD  Blood Glucose Monitoring Suppl (TRUE METRIX METER) DEVI 1 each by Does not apply route 3 (three) times daily before meals. 07/25/17   [name], MD  calcium acetate (PHOSLO) 667 MG capsule Take 2 capsules (1,334 mg total) by mouth 3 (three) times daily with meals. Patient not taking: Reported on 10/06/2016 08/04/16   Mendel Corning, MD  cetirizine (ZYRTEC) 10 MG tablet Take 1 tablet (10 mg total) by mouth daily. 08/23/17   [name], MD  DULoxetine (CYMBALTA) 60 MG capsule Take 1 capsule (60 mg total) by mouth daily. 07/25/17   [name], MD  gabapentin (NEURONTIN) 300 MG capsule Take 2 capsules (600 mg total) by mouth at bedtime. 07/25/17   [name], MD  glucose blood (TRUE METRIX BLOOD GLUCOSE TEST) test strip Use 3 times daily before meals 07/25/17   [name], MD  insulin glargine (LANTUS) 100 UNIT/ML injection Inject 0.15 mLs (15 Units total) into the skin 2 (two) times daily. 07/25/17   [name], MD  Insulin Syringe-Needle U-100 (BD INSULIN SYRINGE ULTRAFINE) 31G X 15/64" 0.5 ML MISC 1 each by Does not apply route daily. 07/25/17   [name], MD  methocarbamol (ROBAXIN-750) 750 MG tablet Take 1 tablet (750  mg total) by mouth 2 (two) times daily as needed for muscle spasms. 07/25/17   [name], MD  olopatadine (PATANOL) 0.1 % ophthalmic solution Place 1 drop into both eyes 2 (two) times daily. 10/11/16   [name], MD  TRUEPLUS LANCETS 28G MISC 1 each by Does not apply route 3 (three) times daily before meals. 07/25/17   [name], MD  valsartan (DIOVAN) 160 MG tablet Take 1 tablet (160 mg total) by mouth daily. 07/25/17   [name], MD     No  family history on file.  Social History   Socioeconomic History  . Marital status: Married    Spouse name: Not on file  . Number of children: Not on file  . Years of education: Not on file  . Highest education level: Not on file  Social Needs  . Financial resource strain: Not on file  . Food insecurity - worry: Not on file  . Food insecurity - inability: Not on file  . Transportation needs - medical: Not on file  . Transportation needs - non-medical: Not on file  Occupational History  . Not on file  Tobacco Use  . Smoking status: Never Smoker  . Smokeless tobacco: Never Used  Substance and Sexual Activity  . Alcohol use: No    Alcohol/week: 0.0 oz    Comment: rare- a beer every now and then  . Drug use: No  . Sexual activity: Not Currently  Other Topics Concern  . Not on file  Social History Narrative  . Not on file     Review of Systems: A 12 point ROS discussed and pertinent positives are indicated in the HPI above.  All other systems are negative.  Review of Systems  Constitutional: Negative for fatigue and fever.  Respiratory: Negative for cough and shortness of breath.   Cardiovascular: Negative for chest pain.  Gastrointestinal: Negative for abdominal pain.  Musculoskeletal: Negative for back pain.  Psychiatric/Behavioral: Negative for behavioral problems and confusion.    Vital Signs: LMP  (LMP Unknown)   Physical Exam  Constitutional: She is oriented to person, place, and time. She appears well-developed.  Cardiovascular: Normal rate, regular rhythm and normal heart sounds.  Pulmonary/Chest: Effort normal and breath sounds normal. No respiratory distress.  Abdominal: Soft.  Neurological: She is alert and oriented to person, place, and time.  Skin: Skin is warm and dry.  Psychiatric: She has a normal mood and affect. Her behavior is normal. Judgment and thought content normal.  Nursing note and vitals reviewed.    MD Evaluation Airway: WNL Heart:  WNL Abdomen: WNL Chest/ Lungs: WNL ASA  Classification: 3 Mallampati/Airway Score: One   Imaging: No results found.  Labs:  CBC: Recent Labs    12/02/16 1237 12/03/16 0526 12/04/16 0337 04/16/17 2254  WBC 23.8* 22.1* 18.8* 5.0  HGB 12.4 10.4* 10.9* 11.9*  HCT 39.1 33.0* 34.6* 36.5  PLT 274 228 236 126*    COAGS: No results for input(s): INR, APTT in the last 8760 hours.  BMP: Recent Labs    12/04/16 0337 12/05/16 0546 12/06/16 0643 04/16/17 2254  NA 135 126* 134* 133*  K 4.5 3.9 3.6 4.5  CL 95* 88* 95* 94*  CO2 28 24 26 24   GLUCOSE 129* 161* 122* 129*  BUN 42* 52* 21* 83*  CALCIUM 8.8* 8.2* 8.5* 9.0  CREATININE 5.83* 6.54* 4.34* 7.66*  GFRNONAA 7* 6* 10* 5*  GFRAA 8* 7* 11* 6*    LIVER FUNCTION TESTS: Recent  Labs    12/02/16 1408  BILITOT 0.8  AST 19  ALT 13*  ALKPHOS 154*  PROT 6.5  ALBUMIN 2.1*    TUMOR MARKERS: No results for input(s): AFPTM, CEA, CA199, CHROMGRNA in the last 8760 hours.  Assessment and Plan: Patient with past medical history of renal failure on HD via left upper arm fistula presents with complaint of malfunctioning access.  IR consulted for fistulagram and intervention at the request of Dr. Arty Baumgartner. Patient presents today in their usual state of health.  She has been NPO and is not currently on blood thinners.    Risks and benefits discussed with the patient including, but not limited to bleeding, infection, vascular injury, pulmonary embolism, need for tunneled HD catheter placement or even death.  All of the patient's questions were answered, patient is agreeable to proceed. Consent signed and in chart.  Thank you for this interesting consult.  I greatly enjoyed meeting Chrystian Ressler and look forward to participating in their care.  A copy of this report was sent to the requesting provider on this date.  Electronically Signed: Docia Barrier, PA 09/12/2017, 9:07 AM   I spent a total of  30  Minutes   in face to face in clinical consultation, greater than 50% of which was counseling/coordinating care for renal failure.

## 2017-09-12 NOTE — Procedures (Signed)
ESRD  S/p LUE FISTULOGRAM WITH 1Z73 FLAIR AT CEPHALIC SUBCLAVIAN JUNCTION AND 6MM PERIPHERAL VENOUS PTA  NO COMP STABLE EBL 5CC READY FOR HD  FULL REPORT IN PACS

## 2017-09-20 MED FILL — GABAPENTIN 300 MG CAPSULE: 300 | 30 days supply | Qty: 60 | Fill #1

## 2017-09-20 MED FILL — VALSARTAN 160 MG TABS: 160 | 30 days supply | Qty: 30 | Fill #1

## 2017-10-03 MED FILL — DULoxetine HCL 60 MG CPEP: 60 | 30 days supply | Qty: 30 | Fill #2

## 2017-10-17 MED FILL — ATORVASTATIN CALCIUM 40 MG: 40 | 30 days supply | Qty: 30 | Fill #2

## 2017-10-17 MED FILL — TRUE METRIX TEST STRIP: 30 days supply | Qty: 100 | Fill #1

## 2017-10-23 ENCOUNTER — Ambulatory Visit: Payer: Self-pay | Admitting: Family Medicine

## 2017-10-25 MED FILL — VALSARTAN 160 MG TABS: 160 | 30 days supply | Qty: 30 | Fill #2

## 2017-10-25 MED FILL — GABAPENTIN 300 MG CAPSULE: 300 | 30 days supply | Qty: 60 | Fill #2

## 2017-11-01 ENCOUNTER — Encounter (HOSPITAL_COMMUNITY): Payer: Self-pay

## 2017-11-01 ENCOUNTER — Emergency Department (HOSPITAL_COMMUNITY): Payer: Self-pay

## 2017-11-01 ENCOUNTER — Emergency Department (HOSPITAL_COMMUNITY)
Admission: EM | Admit: 2017-11-01 | Discharge: 2017-11-01 | Disposition: A | Discharge status: Home / Self Care | Payer: Self-pay | Attending: Emergency Medicine | Admitting: Emergency Medicine

## 2017-11-01 DIAGNOSIS — W230XXA Caught, crushed, jammed, or pinched between moving objects, initial encounter: Secondary | ICD-10-CM | POA: Insufficient documentation

## 2017-11-01 DIAGNOSIS — N186 End stage renal disease: Secondary | ICD-10-CM | POA: Insufficient documentation

## 2017-11-01 DIAGNOSIS — E079 Disorder of thyroid, unspecified: Secondary | ICD-10-CM | POA: Insufficient documentation

## 2017-11-01 DIAGNOSIS — Y999 Unspecified external cause status: Secondary | ICD-10-CM | POA: Insufficient documentation

## 2017-11-01 DIAGNOSIS — Z79899 Other long term (current) drug therapy: Secondary | ICD-10-CM | POA: Insufficient documentation

## 2017-11-01 DIAGNOSIS — Y939 Activity, unspecified: Secondary | ICD-10-CM | POA: Insufficient documentation

## 2017-11-01 DIAGNOSIS — Y929 Unspecified place or not applicable: Secondary | ICD-10-CM | POA: Insufficient documentation

## 2017-11-01 DIAGNOSIS — E119 Type 2 diabetes mellitus without complications: Secondary | ICD-10-CM | POA: Insufficient documentation

## 2017-11-01 DIAGNOSIS — I120 Hypertensive chronic kidney disease with stage 5 chronic kidney disease or end stage renal disease: Secondary | ICD-10-CM | POA: Insufficient documentation

## 2017-11-01 DIAGNOSIS — S68011A Complete traumatic metacarpophalangeal amputation of right thumb, initial encounter: Secondary | ICD-10-CM

## 2017-11-01 DIAGNOSIS — Z794 Long term (current) use of insulin: Secondary | ICD-10-CM | POA: Insufficient documentation

## 2017-11-01 DIAGNOSIS — S61101A Unspecified open wound of right thumb with damage to nail, initial encounter: Secondary | ICD-10-CM | POA: Insufficient documentation

## 2017-11-01 DIAGNOSIS — Z23 Encounter for immunization: Secondary | ICD-10-CM | POA: Insufficient documentation

## 2017-11-01 MED ORDER — CEPHALEXIN 500 MG PO CAPS: 500.0000 mg | ORAL_CAPSULE | Freq: Two times a day (BID) | ORAL | 0 refills | Status: DC

## 2017-11-01 MED ORDER — TETANUS-DIPHTH-ACELL PERTUSSIS 5-2.5-18.5 LF-MCG/0.5 IM SUSP
0.5000 mL | Freq: Once | INTRAMUSCULAR | Status: AC
  Administered 2017-11-01: 0.5 mL via INTRAMUSCULAR
  Filled 2017-11-01: qty 0.5

## 2017-11-01 MED ORDER — OXYCODONE-ACETAMINOPHEN 5-325 MG PO TABS: 1.0000 | ORAL_TABLET | ORAL | 0 refills | Status: DC | PRN

## 2017-11-01 MED ORDER — CEFAZOLIN SODIUM-DEXTROSE 1-4 GM/50ML-% IV SOLN
1.0000 g | Freq: Once | INTRAVENOUS | Status: AC
  Administered 2017-11-01: 1 g via INTRAVENOUS
  Filled 2017-11-01: qty 50

## 2017-11-01 MED FILL — CEPHALEXIN 500 MG CAPSULE: 500 | 5 days supply | Qty: 10 | Fill #0

## 2017-11-01 NOTE — ED Triage Notes (Signed)
Pt has amputated tip of r thumb that she shut in the bathroom door. Last tetanus unknown. Pt is a dialysis pt.

## 2017-11-01 NOTE — Discharge Instructions (Addendum)
Take your antibiotics. Leave your bandage on until your follow-up appointment.  Your body will be working under the bandage to heal your wound.  Leaving it undisturbed will help it.

## 2017-11-01 NOTE — ED Provider Notes (Signed)
[facility] EMERGENCY DEPARTMENT Provider Note   CSN: [id number] Arrival date & time: 11/01/17  0344     History   Chief Complaint Chief Complaint  Patient presents with  . Finger Injury    HPI Sarah Kelley is a 67 y.o. female.  The history is provided by the patient. A language interpreter was used.  She has history of diabetes, hypertension, hyperlipidemia, diabetic neuropathy and comes in after injuring her right thumb.  She closed a door on it and suffered an amputation of the tip.  She does not know when her last tetanus immunization was.  Past Medical History:  Diagnosis Date  . Diabetes mellitus   . Hyperlipidemia   . Hypertension   . Neuropathy    Diabetic  . Pneumonia 07/2016  . Renal disorder   . Thyroid disease     Patient Active Problem List   Diagnosis Date Noted  . Insomnia 01/18/2017  . Transient alteration of awareness   . Goals of care, counseling/discussion   . Oropharyngeal dysphagia 12/04/2016  . UTI (urinary tract infection) 12/04/2016  . Severe sepsis ([place]) 12/02/2016  . Hyperglycemia 12/02/2016  . Acute encephalopathy 07/28/2016  . CAP (community acquired pneumonia) 07/28/2016  . Pneumonia 07/28/2016  . ESRD (end stage renal disease) ([place])   . Phantom pain ([place]) 09/13/2015  . Hyperlipidemia 06/01/2015  . 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 Insulin-requiring or dependent type II diabetes mellitus ([place])   . Chronic kidney disease (CKD) stage G4/A1, severely decreased glomerular filtration rate (GFR) between 15-29 mL/min/1.73 square meter and albuminuria creatinine ratio less than 30 mg/g ([facility]) 05/18/2015  . Diabetic infection of left foot ([place]) 05/18/2015  . Osteomyelitis ([place]) 05/18/2015  . Abdominal pain 05/18/2015  . Osteomyelitis of left foot ([place]) 05/18/2015  . Diabetic neuropathy ([place]) 01/14/2007  . Diabetes mellitus without complication ([place]) 47/42/5956  . HLD (hyperlipidemia) 01/14/2007  . Normocytic  anemia 01/14/2007  . ANXIETY 01/14/2007  . Depression 01/14/2007  . Essential hypertension 01/14/2007    Past Surgical History:  Procedure Laterality Date  . AMPUTATION Left 05/21/2015   Procedure: Left Foot 3rd Ray Amputation;  Surgeon: Newt Minion, MD;  Location: [place];  Service: Orthopedics;  Laterality: Left;  . AV FISTULA PLACEMENT Left 03/28/2016   Procedure: ARTERIOVENOUS (AV) FISTULA CREATION LEFT ARM;  Surgeon: Waynetta Sandy, MD;  Location: [place];  Service: Vascular;  Laterality: Left;  . DILATION AND CURETTAGE OF UTERUS    . FISTULA SUPERFICIALIZATION Left 09/04/2016   Procedure: FISTULA SUPERFICIALIZATION LEFT UPPER ARM;  Surgeon: Waynetta Sandy, MD;  Location: [place];  Service: Vascular;  Laterality: Left;  . IR AV DIALY SHUNT INTRO NEEDLE/INTRAC INITIAL W/PTA/STENT/IMG LT Left 06/13/2017  . IR AV DIALY SHUNT INTRO NEEDLE/INTRAC INITIAL W/PTA/STENT/IMG LT Left 09/12/2017  . IR AV DIALY SHUNT INTRO NEEDLE/INTRACATH INITIAL W/PTA/IMG LEFT  11/10/2016  . IR AV DIALY SHUNT INTRO NEEDLE/INTRACATH INITIAL W/PTA/IMG LEFT  03/09/2017  . IR GENERIC HISTORICAL  08/01/2016   IR FLUORO GUIDE CV LINE RIGHT 08/01/2016 [name], MD [facility]INTER[facility]  . IR GENERIC HISTORICAL  08/01/2016   IR US GUIDE VASC ACCESS RIGHT 08/01/2016 [name], MD [facility]  . IR REMOVAL TUN CV CATH W/O FL  11/01/2016  . IR US GUIDE VASC ACCESS LEFT  11/10/2016  . IR US GUIDE VASC ACCESS LEFT  09/12/2017     OB History   None      Home Medications  Prior to Admission medications   Medication Sig Start Date End Date Taking? Authorizing Provider  atorvastatin (LIPITOR) 40 MG tablet Take 1 tablet (40 mg total) by mouth daily. 07/25/17   [name], MD  Blood Glucose Monitoring Suppl (TRUE METRIX METER) DEVI 1 each by Does not apply route 3 (three) times daily before meals. 07/25/17   [name], MD  calcium acetate (PHOSLO) 667 MG capsule Take 2 capsules (1,334 mg total) by mouth 3  (three) times daily with meals. Patient not taking: Reported on 10/06/2016 08/04/16   Mendel Corning, MD  cetirizine (ZYRTEC) 10 MG tablet Take 1 tablet (10 mg total) by mouth daily. 08/23/17   [name], MD  DULoxetine (CYMBALTA) 60 MG capsule Take 1 capsule (60 mg total) by mouth daily. 07/25/17   [name], MD  gabapentin (NEURONTIN) 300 MG capsule Take 2 capsules (600 mg total) by mouth at bedtime. 07/25/17   [name], MD  glucose blood (TRUE METRIX BLOOD GLUCOSE TEST) test strip Use 3 times daily before meals 07/25/17   [name], MD  insulin glargine (LANTUS) 100 UNIT/ML injection Inject 0.15 mLs (15 Units total) into the skin 2 (two) times daily. 07/25/17   [name], MD  Insulin Syringe-Needle U-100 (BD INSULIN SYRINGE ULTRAFINE) 31G X 15/64" 0.5 ML MISC 1 each by Does not apply route daily. 07/25/17   [name], MD  methocarbamol (ROBAXIN-750) 750 MG tablet Take 1 tablet (750 mg total) by mouth 2 (two) times daily as needed for muscle spasms. 07/25/17   [name], MD  olopatadine (PATANOL) 0.1 % ophthalmic solution Place 1 drop into both eyes 2 (two) times daily. 10/11/16   [name], MD  TRUEPLUS LANCETS 28G MISC 1 each by Does not apply route 3 (three) times daily before meals. 07/25/17   [name], MD  valsartan (DIOVAN) 160 MG tablet Take 1 tablet (160 mg total) by mouth daily. 07/25/17   [name], MD    Family History No family history on file.  Social History Social History   Tobacco Use  . Smoking status: Never Smoker  . Smokeless tobacco: Never Used  Substance Use Topics  . Alcohol use: No    Alcohol/week: 0.0 oz    Comment: rare- a beer every now and then  . Drug use: No     Allergies   No known allergies   Review of Systems Review of Systems  All other systems reviewed and are negative.    Physical Exam Updated Vital Signs BP (!) 212/80 (BP Location: Right Arm)   Pulse 76   Temp 98.9 F (37.2 C) (Oral)    Resp 17   Ht 4\' 9"  (1.448 m)   Wt 76.2 kg (168 lb)   LMP  (LMP Unknown)   SpO2 99%   BMI 36.35 kg/m   Physical Exam  Nursing note and vitals reviewed.  67 year old female, resting comfortably and in no acute distress. Vital signs are significant for elevated blood pressure. Oxygen saturation is 99%, which is normal. Head is normocephalic and atraumatic. PERRLA, EOMI. Oropharynx is clear. Neck is nontender and supple without adenopathy or JVD. Back is nontender and there is no CVA tenderness. Lungs are clear without rales, wheezes, or rhonchi. Chest is nontender. Heart has regular rate and rhythm without murmur. Abdomen is soft, flat, nontender without masses or hepatosplenomegaly and peristalsis is normoactive. Extremities: Amputation of the tip of the right thumb with complete avulsion of the nail.  Family has  brought the avulsed part of the thumb in with them. Skin is warm and dry without rash. Neurologic: Mental status is normal, cranial nerves are intact, there are no motor or sensory deficits.  ED Treatments / Results   Radiology Dg Finger Thumb Right  Result Date: 11/01/2017 CLINICAL DATA:  Patient shut the right thumb in the bathroom door tonight. Tip of right thumb amputated. EXAM: RIGHT THUMB 2+V COMPARISON:  None. FINDINGS: Soft tissue amputation of the distal right first finger. Displaced comminuted crush fracture of the distal phalangeal tuft. The largest fragment is displaced approximately 4.5 mm volar to the distal phalangeal tuft. Proximal phalanx and IP joint appear intact. IMPRESSION: Soft tissue amputation of the distal right first finger with displaced comminuted crush fracture of the distal phalangeal tuft. Electronically Signed   By: Lucienne Capers M.D.   On: 11/01/2017 04:38    Procedures Procedures  Medications Ordered in ED Medications  Tdap (BOOSTRIX) injection 0.5 mL (has no administration in time range)  ceFAZolin (ANCEF) IVPB 1 g/50 mL premix (has  no administration in time range)     Initial Impression / Assessment and Plan / ED Course  I have reviewed the triage vital signs and the nursing notes.  Pertinent labs & imaging results that were available during my care of the patient were reviewed by me and considered in my medical decision making (see chart for details).  Tip amputation of the right thumb with avulsion of nail.  Tdap booster is given.  She is given a dose of cefazolin and is being sent for x-ray.  X-ray shows fracture of the tip of the distal phalanx and tip amputation.  Case is discussed with Dr. Grandville Silos.  He has reviewed x-rays and picture taken of the injury.  He requests Xeroform dressing be applied and he will make arrangements for follow-up in his office.  She is discharged with prescriptions for cephalexin and oxycodone-acetaminophen.  Final Clinical Impressions(s) / ED Diagnoses   Final diagnoses:  Amputation of right thumb, initial encounter    ED Discharge Orders        Ordered    cephALEXin (KEFLEX) 500 MG capsule  2 times daily     11/01/17 0529    oxyCODONE-acetaminophen (PERCOCET) 5-325 MG tablet  Every 4 hours PRN     96/29/52 8413       Delora Fuel, MD 24/40/10 [phone]

## 2017-11-06 ENCOUNTER — Ambulatory Visit: Payer: Self-pay | Admitting: Family Medicine

## 2017-11-06 ENCOUNTER — Encounter (HOSPITAL_COMMUNITY): Payer: Self-pay | Admitting: *Deleted

## 2017-11-06 ENCOUNTER — Emergency Department (HOSPITAL_COMMUNITY)
Admission: EM | Admit: 2017-11-06 | Discharge: 2017-11-06 | Disposition: A | Discharge status: Home / Self Care | Payer: Self-pay | Attending: Emergency Medicine | Admitting: Emergency Medicine

## 2017-11-06 DIAGNOSIS — E079 Disorder of thyroid, unspecified: Secondary | ICD-10-CM | POA: Insufficient documentation

## 2017-11-06 DIAGNOSIS — E1140 Type 2 diabetes mellitus with diabetic neuropathy, unspecified: Secondary | ICD-10-CM | POA: Insufficient documentation

## 2017-11-06 DIAGNOSIS — E1122 Type 2 diabetes mellitus with diabetic chronic kidney disease: Secondary | ICD-10-CM | POA: Insufficient documentation

## 2017-11-06 DIAGNOSIS — Z992 Dependence on renal dialysis: Secondary | ICD-10-CM | POA: Insufficient documentation

## 2017-11-06 DIAGNOSIS — Z794 Long term (current) use of insulin: Secondary | ICD-10-CM | POA: Insufficient documentation

## 2017-11-06 DIAGNOSIS — R55 Syncope and collapse: Secondary | ICD-10-CM | POA: Insufficient documentation

## 2017-11-06 DIAGNOSIS — Z79899 Other long term (current) drug therapy: Secondary | ICD-10-CM | POA: Insufficient documentation

## 2017-11-06 DIAGNOSIS — I120 Hypertensive chronic kidney disease with stage 5 chronic kidney disease or end stage renal disease: Secondary | ICD-10-CM | POA: Insufficient documentation

## 2017-11-06 DIAGNOSIS — N186 End stage renal disease: Secondary | ICD-10-CM | POA: Insufficient documentation

## 2017-11-06 NOTE — ED Provider Notes (Signed)
Multiple attempts have been made to obtain blood.  Pt is here with her family.  She feels fine and does not want to wait any longer for lab testing.  I offered to attempt to draw blood myself.  Family is very pleasant but they state they do not want any further attempts and just want to go home.  She feels fine.  She is not concerned. She knows to return if she has any recurrent symptoms or changes her mind about getting testing done.  Please see Dr Andree Elk note for initial presentation.   Dorie Rank, MD 11/06/17 [phone]

## 2017-11-06 NOTE — ED Provider Notes (Signed)
[facility] EMERGENCY DEPARTMENT Provider Note   CSN: [id number] Arrival date & time: 11/06/17  1207     History   Chief Complaint Chief Complaint  Patient presents with  . Loss of Consciousness    HPI Sarah Kelley is a 67 y.o. female.  HPI Patient was offered translation services, however she refused the utilization of translation services and insisted that we have her family member translate for her.  67 year old female with history of hypertension, diabetes and end-stage renal disease on hemodialysis comes in with chief complaint of syncope. Patient states that she was trying to get on the [facility], and the next thing she knows she was being helped by EMS.  Patient had no prodrome prior to her syncope.  Patient and the family report that patient has had about 4 or 5 episodes this year alone with similar episodes.  When patient woke up she was feeling completely fine, and family and patient deny any recent infections.  Patient does not have any CAD, CHF history.  Past Medical History:  Diagnosis Date  . Diabetes mellitus   . Hyperlipidemia   . Hypertension   . Neuropathy    Diabetic  . Pneumonia 07/2016  . Renal disorder   . Thyroid disease     Patient Active Problem List   Diagnosis Date Noted  . Insomnia 01/18/2017  . Transient alteration of awareness   . Goals of care, counseling/discussion   . Oropharyngeal dysphagia 12/04/2016  . UTI (urinary tract infection) 12/04/2016  . Severe sepsis ([place]) 12/02/2016  . Hyperglycemia 12/02/2016  . Acute encephalopathy 07/28/2016  . CAP (community acquired pneumonia) 07/28/2016  . Pneumonia 07/28/2016  . ESRD (end stage renal disease) ([place])   . Phantom pain ([place]) 09/13/2015  . Hyperlipidemia 06/01/2015  . 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 Insulin-requiring or dependent type II diabetes mellitus ([place])   . Chronic kidney disease (CKD) stage G4/A1, severely decreased glomerular filtration rate (GFR)  between 15-29 mL/min/1.73 square meter and albuminuria creatinine ratio less than 30 mg/g ([facility]) 05/18/2015  . Diabetic infection of left foot ([place]) 05/18/2015  . Osteomyelitis ([place]) 05/18/2015  . Abdominal pain 05/18/2015  . Osteomyelitis of left foot ([place]) 05/18/2015  . Diabetic neuropathy ([place]) 01/14/2007  . Diabetes mellitus without complication ([place]) 22/48/2500  . HLD (hyperlipidemia) 01/14/2007  . Normocytic anemia 01/14/2007  . ANXIETY 01/14/2007  . Depression 01/14/2007  . Essential hypertension 01/14/2007    Past Surgical History:  Procedure Laterality Date  . AMPUTATION Left 05/21/2015   Procedure: Left Foot 3rd Ray Amputation;  Surgeon: Newt Minion, MD;  Location: [facility];  Service: Orthopedics;  Laterality: Left;  . AV FISTULA PLACEMENT Left 03/28/2016   Procedure: ARTERIOVENOUS (AV) FISTULA CREATION LEFT ARM;  Surgeon: Waynetta Sandy, MD;  Location: [facility];  Service: Vascular;  Laterality: Left;  . DILATION AND CURETTAGE OF UTERUS    . FISTULA SUPERFICIALIZATION Left 09/04/2016   Procedure: FISTULA SUPERFICIALIZATION LEFT UPPER ARM;  Surgeon: Waynetta Sandy, MD;  Location: [facility];  Service: Vascular;  Laterality: Left;  . IR AV DIALY SHUNT INTRO NEEDLE/INTRAC INITIAL W/PTA/STENT/IMG LT Left 06/13/2017  . IR AV DIALY SHUNT INTRO NEEDLE/INTRAC INITIAL W/PTA/STENT/IMG LT Left 09/12/2017  . IR AV DIALY SHUNT INTRO NEEDLE/INTRACATH INITIAL W/PTA/IMG LEFT  11/10/2016  . IR AV DIALY SHUNT INTRO NEEDLE/INTRACATH INITIAL W/PTA/IMG LEFT  03/09/2017  . IR GENERIC HISTORICAL  08/01/2016   IR FLUORO GUIDE CV LINE RIGHT 08/01/2016 [name], MD [facility]  RAD  . IR GENERIC HISTORICAL  08/01/2016   IR US GUIDE VASC ACCESS RIGHT 08/01/2016 [name], MD [facility]  . IR REMOVAL TUN CV CATH W/O FL  11/01/2016  . IR US GUIDE VASC ACCESS LEFT  11/10/2016  . IR US GUIDE VASC ACCESS LEFT  09/12/2017     OB History   None      Home Medications    Prior to Admission  medications   Medication Sig Start Date End Date Taking? Authorizing Provider  atorvastatin (LIPITOR) 40 MG tablet Take 1 tablet (40 mg total) by mouth daily. 07/25/17   [name], MD  Blood Glucose Monitoring Suppl (TRUE METRIX METER) DEVI 1 each by Does not apply route 3 (three) times daily before meals. 07/25/17   [name], MD  cephALEXin (KEFLEX) 500 MG capsule Take 1 capsule (500 mg total) by mouth 2 (two) times daily. 10/07/80   Delora Fuel, MD  DULoxetine (CYMBALTA) 60 MG capsule Take 1 capsule (60 mg total) by mouth daily. 07/25/17   [name], MD  gabapentin (NEURONTIN) 300 MG capsule Take 2 capsules (600 mg total) by mouth at bedtime. 07/25/17   [name], MD  glucose blood (TRUE METRIX BLOOD GLUCOSE TEST) test strip Use 3 times daily before meals 07/25/17   [name], MD  insulin glargine (LANTUS) 100 UNIT/ML injection Inject 0.15 mLs (15 Units total) into the skin 2 (two) times daily. 07/25/17   [name], MD  Insulin Syringe-Needle U-100 (BD INSULIN SYRINGE ULTRAFINE) 31G X 15/64" 0.5 ML MISC 1 each by Does not apply route daily. 07/25/17   [name], MD  oxyCODONE-acetaminophen (PERCOCET) 5-325 MG tablet Take 1 tablet by mouth every 4 (four) hours as needed for moderate pain. 03/07/61   Delora Fuel, MD  TRUEPLUS LANCETS 28G MISC 1 each by Does not apply route 3 (three) times daily before meals. 07/25/17   [name], MD  valsartan (DIOVAN) 160 MG tablet Take 1 tablet (160 mg total) by mouth daily. 07/25/17   [name], MD    Family History No family history on file.  Social History Social History   Tobacco Use  . Smoking status: Never Smoker  . Smokeless tobacco: Never Used  Substance Use Topics  . Alcohol use: No    Alcohol/week: 0.0 oz    Comment: rare- a beer every now and then  . Drug use: No     Allergies   No known allergies   Review of Systems Review of Systems  Respiratory: Negative for chest tightness.     Cardiovascular: Negative for palpitations.  Neurological: Negative for syncope.  All other systems reviewed and are negative.   Physical Exam Updated Vital Signs BP 134/78   Pulse 72   Temp 98 F (36.7 C)   Resp 17   LMP  (LMP Unknown)   SpO2 98%   Physical Exam  Constitutional: She is oriented to person, place, and time. She appears well-developed.  HENT:  Head: Normocephalic and atraumatic.  Eyes: EOM are normal.  Neck: Normal range of motion. Neck supple.  Cardiovascular: Normal rate.  Pulmonary/Chest: Effort normal.  Abdominal: Bowel sounds are normal.  Neurological: She is alert and oriented to person, place, and time.  Skin: Skin is warm and dry.  Nursing note and vitals reviewed.    ED Treatments / Results  Labs (all labs ordered are listed, but only abnormal results are displayed) Labs Reviewed - No data to display  EKG EKG Interpretation  Date/Time:  Tuesday Nov 06 2017 12:25:53 EDT Ventricular Rate:  68 PR Interval:    QRS Duration: 136 QT Interval:  466 QTC Calculation: 496 R Axis:   -51 Text Interpretation:  Sinus rhythm Borderline short PR interval RBBB and LAFB Probable left ventricular hypertrophy No acute changes Nonspecific ST and T wave abnormality Confirmed by Varney Biles [phone]) on 11/06/2017 1:23:57 PM   Radiology No results found.  Procedures Procedures (including critical care time)  Medications Ordered in ED Medications - No data to display   Initial Impression / Assessment and Plan / ED Course  I have reviewed the triage vital signs and the nursing notes.  Pertinent labs & imaging results that were available during my care of the patient were reviewed by me and considered in my medical decision making (see chart for details).     67 year old comes in with chief complaint of syncope.  Patient has history of end-stage renal disease, hypertension and hyperlipidemia.  She does not have any coronary artery disease and there is  no history of any dysrhythmias.  Patient and family report that when she woke up she was feeling well.  She has had 4 episodes of syncope within the last 4 months.  Each of those episodes did not have any prodrome and patient starts feeling better after a few minutes.  Family suspects that patient's syncope have been due to low blood sugar, since her dialysis team has asked her not to eat or drink before her dialysis.  Each of the previous syncopal episodes also occurred after dialysis.  Patient's EKG has some nonspecific changes, but there are no new changes.  We will get basic labs and keep patient on telemetry.  If patient were to be discharged, we will have her follow-up with cardiology team and she might benefit with Holter monitoring.  Final Clinical Impressions(s) / ED Diagnoses   Final diagnoses:  Syncope and collapse  ESRD (end stage renal disease) on dialysis [facility])    ED Discharge Orders    None       Varney Biles, MD 11/07/17 [id number]

## 2017-11-06 NOTE — ED Notes (Signed)
Carb modified lunch tray ordered 

## 2017-11-06 NOTE — Progress Notes (Signed)
RT attempted to get blood by arterial puncture (RR x1 attempt) for RN per MD request without success. RN aware

## 2017-11-06 NOTE — Discharge Instructions (Addendum)
Return to the ED as needed for worsening symptoms or any recurrent episodes

## 2017-11-06 NOTE — ED Notes (Signed)
Phlebotomist unable to obtain labs

## 2017-11-06 NOTE — ED Notes (Signed)
Unable to obtain Labs.

## 2017-11-06 NOTE — ED Triage Notes (Signed)
Pt in from HD via [facility], per report pt had full HD today and when she was leaving had a witnessed syncopal today lasting a minute and was lowered to the ground by a friend, per report HD staff re-accessed her HD access L upper arm and pt had 900 mL NS given, pt BP 190/90, HR 80, RBBB, pt A&O x4, pt spanish speaking, NAD

## 2017-11-06 NOTE — ED Notes (Signed)
Md informed RT tried to art stick for labs per MD order, she was unsuccessful.

## 2017-11-07 MED FILL — DULoxetine HCL 60 MG CPEP: 60 | 30 days supply | Qty: 30 | Fill #3

## 2017-11-14 ENCOUNTER — Other Ambulatory Visit: Payer: Self-pay

## 2017-11-14 ENCOUNTER — Emergency Department (HOSPITAL_COMMUNITY): Payer: Self-pay

## 2017-11-14 ENCOUNTER — Encounter (HOSPITAL_COMMUNITY): Payer: Self-pay | Admitting: Emergency Medicine

## 2017-11-14 ENCOUNTER — Emergency Department (HOSPITAL_COMMUNITY)
Admission: EM | Admit: 2017-11-14 | Discharge: 2017-11-14 | Disposition: A | Discharge status: Home / Self Care | Payer: Self-pay | Attending: Emergency Medicine | Admitting: Emergency Medicine

## 2017-11-14 DIAGNOSIS — W19XXXA Unspecified fall, initial encounter: Secondary | ICD-10-CM

## 2017-11-14 DIAGNOSIS — N186 End stage renal disease: Secondary | ICD-10-CM | POA: Insufficient documentation

## 2017-11-14 DIAGNOSIS — E1140 Type 2 diabetes mellitus with diabetic neuropathy, unspecified: Secondary | ICD-10-CM | POA: Insufficient documentation

## 2017-11-14 DIAGNOSIS — Z79899 Other long term (current) drug therapy: Secondary | ICD-10-CM | POA: Insufficient documentation

## 2017-11-14 DIAGNOSIS — R739 Hyperglycemia, unspecified: Secondary | ICD-10-CM

## 2017-11-14 DIAGNOSIS — I120 Hypertensive chronic kidney disease with stage 5 chronic kidney disease or end stage renal disease: Secondary | ICD-10-CM | POA: Insufficient documentation

## 2017-11-14 DIAGNOSIS — Z794 Long term (current) use of insulin: Secondary | ICD-10-CM | POA: Insufficient documentation

## 2017-11-14 DIAGNOSIS — R0789 Other chest pain: Secondary | ICD-10-CM | POA: Insufficient documentation

## 2017-11-14 DIAGNOSIS — Z992 Dependence on renal dialysis: Secondary | ICD-10-CM | POA: Insufficient documentation

## 2017-11-14 DIAGNOSIS — E1165 Type 2 diabetes mellitus with hyperglycemia: Secondary | ICD-10-CM | POA: Insufficient documentation

## 2017-11-14 LAB — CBC
HCT: 32.8 % — ABNORMAL LOW (ref 36.0–46.0)
HEMOGLOBIN: 10.8 g/dL — AB (ref 12.0–15.0)
MCH: 31.5 pg (ref 26.0–34.0)
MCHC: 32.9 g/dL (ref 30.0–36.0)
MCV: 95.6 fL (ref 78.0–100.0)
Platelets: 161 10*3/uL (ref 150–400)
RBC: 3.43 MIL/uL — AB (ref 3.87–5.11)
RDW: 13.8 % (ref 11.5–15.5)
WBC: 6.1 10*3/uL (ref 4.0–10.5)

## 2017-11-14 LAB — CBG MONITORING, ED: GLUCOSE-CAPILLARY: 176 mg/dL — AB (ref 65–99)

## 2017-11-14 LAB — BASIC METABOLIC PANEL
ANION GAP: 12 (ref 5–15)
BUN: 44 mg/dL — ABNORMAL HIGH (ref 6–20)
CHLORIDE: 95 mmol/L — AB (ref 101–111)
CO2: 27 mmol/L (ref 22–32)
Calcium: 9.6 mg/dL (ref 8.9–10.3)
Creatinine, Ser: 5.06 mg/dL — ABNORMAL HIGH (ref 0.44–1.00)
GFR calc non Af Amer: 8 mL/min — ABNORMAL LOW (ref >60)
GFR, EST AFRICAN AMERICAN: 9 mL/min — AB (ref >60)
Glucose, Bld: 408 mg/dL — ABNORMAL HIGH (ref 65–99)
Potassium: 4.4 mmol/L (ref 3.5–5.1)
Sodium: 134 mmol/L — ABNORMAL LOW (ref 135–145)

## 2017-11-14 LAB — I-STAT TROPONIN, ED: Troponin i, poc: 0.02 ng/mL (ref 0.00–0.08)

## 2017-11-14 LAB — D-DIMER, QUANTITATIVE: D-Dimer, Quant: 0.85 ug/mL-FEU — ABNORMAL HIGH (ref 0.00–0.50)

## 2017-11-14 MED ORDER — IOPAMIDOL (ISOVUE-370) INJECTION 76%
INTRAVENOUS | Status: AC
  Filled 2017-11-14: qty 100

## 2017-11-14 MED ORDER — ACETAMINOPHEN 500 MG PO TABS
1000.0000 mg | ORAL_TABLET | Freq: Once | ORAL | Status: AC
  Administered 2017-11-14: 1000 mg via ORAL
  Filled 2017-11-14: qty 2

## 2017-11-14 MED ORDER — IOPAMIDOL (ISOVUE-370) INJECTION 76%
100.0000 mL | Freq: Once | INTRAVENOUS | Status: AC | PRN
  Administered 2017-11-14: 100 mL via INTRAVENOUS

## 2017-11-14 MED ORDER — INSULIN ASPART 100 UNIT/ML ~~LOC~~ SOLN
8.0000 [IU] | Freq: Once | SUBCUTANEOUS | Status: AC
  Administered 2017-11-14: 8 [IU] via INTRAVENOUS
  Filled 2017-11-14: qty 1

## 2017-11-14 NOTE — ED Triage Notes (Signed)
Per pt family patient has been having rib pain for about 2 weeks s/p fall.  Pt has been also having chest pain x 2 days.  The rib pain radiates to her back on the right side. Pt stated it hurts more when she takes a deep breathe.

## 2017-11-14 NOTE — ED Provider Notes (Signed)
TIME SEEN: 3:40 AM  CHIEF COMPLAINT: Right-sided back pain  HPI: Patient is a 67 year old female with history of hypertension, diabetes, hyperlipidemia, end-stage renal disease on hemodialysis Tuesday, Thursday and Saturday who presents to the emergency department with right thoracic back pain, right posterior rib pain, right hip pain after she fell approximately 2 weeks ago.  Family denies that she had her head at this time.  She was seen in the emergency department on May 2 for this.  States she was not having much pain in her back at that time as she was concerned about a laceration to her finger.  She denies any numbness, tingling or weakness.  No abdominal pain.  Is able to ambulate.   Patient was also seen here on May 7 for syncopal event without prodrome.  At that time work-up was unremarkable.  No history of PE or DVT.  Family does report that she has been complaining of weeks worth of bilateral lower extremity pain mostly in her calves without swelling.   Patient is Spanish-speaking only but refuses interpreter.  Insists that we use her family as an interpreter.  ROS: See HPI Constitutional: no fever  Eyes: no drainage  ENT: no runny nose   Cardiovascular: Right posterior chest pain  Resp: no SOB  GI: no vomiting GU: no dysuria Integumentary: no rash  Allergy: no hives  Musculoskeletal: no leg swelling  Neurological: no slurred speech ROS otherwise negative  PAST MEDICAL HISTORY/PAST SURGICAL HISTORY:  Past Medical History:  Diagnosis Date  . Diabetes mellitus   . Hyperlipidemia   . Hypertension   . Neuropathy    Diabetic  . Pneumonia 07/2016  . Renal disorder   . Thyroid disease     MEDICATIONS:  Prior to Admission medications   Medication Sig Start Date End Date Taking? Authorizing Provider  atorvastatin (LIPITOR) 40 MG tablet Take 1 tablet (40 mg total) by mouth daily. 07/25/17   [name], MD  Blood Glucose Monitoring Suppl (TRUE METRIX METER) DEVI 1 each  by Does not apply route 3 (three) times daily before meals. 07/25/17   [name], MD  cephALEXin (KEFLEX) 500 MG capsule Take 1 capsule (500 mg total) by mouth 2 (two) times daily. 02/04/45   Delora Fuel, MD  DULoxetine (CYMBALTA) 60 MG capsule Take 1 capsule (60 mg total) by mouth daily. 07/25/17   [name], MD  gabapentin (NEURONTIN) 300 MG capsule Take 2 capsules (600 mg total) by mouth at bedtime. 07/25/17   [name], MD  glucose blood (TRUE METRIX BLOOD GLUCOSE TEST) test strip Use 3 times daily before meals 07/25/17   [name], MD  insulin glargine (LANTUS) 100 UNIT/ML injection Inject 0.15 mLs (15 Units total) into the skin 2 (two) times daily. 07/25/17   [name], MD  Insulin Syringe-Needle U-100 (BD INSULIN SYRINGE ULTRAFINE) 31G X 15/64" 0.5 ML MISC 1 each by Does not apply route daily. 07/25/17   [name], MD  oxyCODONE-acetaminophen (PERCOCET) 5-325 MG tablet Take 1 tablet by mouth every 4 (four) hours as needed for moderate pain. 08/09/01   Delora Fuel, MD  TRUEPLUS LANCETS 28G MISC 1 each by Does not apply route 3 (three) times daily before meals. 07/25/17   [name], MD  valsartan (DIOVAN) 160 MG tablet Take 1 tablet (160 mg total) by mouth daily. 07/25/17   [name], MD    ALLERGIES:  Allergies  Allergen Reactions  . No Known Allergies     SOCIAL HISTORY:  Social  History   Tobacco Use  . Smoking status: Never Smoker  . Smokeless tobacco: Never Used  Substance Use Topics  . Alcohol use: No    Alcohol/week: 0.0 oz    Comment: rare- a beer every now and then    FAMILY HISTORY: No family history on file.  EXAM: BP (!) 174/62 (BP Location: Right Arm)   Pulse 65   Temp 98 F (36.7 C) (Oral)   Resp 16   Ht 5' (1.524 m)   Wt 72.6 kg (160 lb)   LMP  (LMP Unknown)   SpO2 100%   BMI 31.25 kg/m  CONSTITUTIONAL: Alert and oriented and responds appropriately to questions. Well-appearing; well-nourished HEAD: Normocephalic,  atraumatic EYES: Conjunctivae clear, pupils appear equal, EOMI ENT: normal nose; moist mucous membranes NECK: Supple, no meningismus, no nuchal rigidity, no LAD  CARD: RRR; S1 and S2 appreciated; no murmurs, no clicks, no rubs, no gallops RESP: Normal chest excursion without splinting or tachypnea; breath sounds clear and equal bilaterally; no wheezes, no rhonchi, no rales, no hypoxia or respiratory distress, speaking full sentences ABD/GI: Normal bowel sounds; non-distended; soft, non-tender, no rebound, no guarding, no peritoneal signs, no hepatosplenomegaly BACK:  The back appears normal and is tender diffusely over the thoracic and lumbar spine without step-off or deformity.  Also tender over the right posterior ribs. EXT: Tender to palpation over the right lateral hip without deformity or leg length discrepancy.  Normal ROM in all joints; otherwise extremities are non-tender to palpation; no edema; normal capillary refill; no cyanosis, no calf tenderness or swelling    SKIN: Normal color for age and race; warm; no rash NEURO: Moves all extremities equally, reports normal sensation diffusely, cranial nerves II to XII intact, normal speech PSYCH: The patient's mood and manner are appropriate. Grooming and personal hygiene are appropriate.  MEDICAL DECISION MAKING: Patient here with pain after a fall.  It is very difficult however to tell if this pain that she is having in her calves recently with her right-sided chest pain that is pleuritic in nature and recent syncopal event could be from something else such as a PE.  I have recommended that we obtain a d-dimer and x-rays of her T and L-spine, right hip, right ribs.  Chest x-ray obtained in triage unremarkable but they do question mild interstitial edema but currently her lungs are clear and she has no hypoxia or other sign of volume overload and has not missed any dialysis recently.  Troponin negative.  Low suspicion for ACS as the symptoms are  atypical.  Have offered her Tylenol for pain.  She has been found to be hyperglycemic without DKA.  Will give IV insulin.  ED PROGRESS: X-ray showed no acute abnormality.  Pain resolved after Tylenol.  D-dimer is unfortunately positive.  Will obtain a CTA of her chest.  She has been on dialysis for several years.  Blood sugar has improved into the 170s after IV insulin.   CTA shows no pulmonary embolus.  She has nonspecific prominence of the mediastinal, right hilar and right peribronchial nodes.  Discussed this with patient and family and have recommend close PCP follow-up as she may need biopsy of these areas.  I have given them a copy of these results.  No other acute abnormality seen on CT imaging.  I feel she is safe to be discharged.  She may use Tylenol as needed for pain over-the-counter.  Again doubt PE, ACS, dissection.   At this time, I do  not feel there is any life-threatening condition present. I have reviewed and discussed all results (EKG, imaging, lab, urine as appropriate) and exam findings with patient/family. I have reviewed nursing notes and appropriate previous records.  I feel the patient is safe to be discharged home without further emergent workup and can continue workup as an outpatient as needed. Discussed usual and customary return precautions. Patient/family verbalize understanding and are comfortable with this plan.  Outpatient follow-up has been provided if needed. All questions have been answered.     EKG Interpretation  Date/Time:  Wednesday Nov 14 2017 01:38:49 EDT Ventricular Rate:  73 PR Interval:  108 QRS Duration: 128 QT Interval:  424 QTC Calculation: 467 R Axis:   -56 Text Interpretation:  Sinus rhythm with short PR Right bundle branch block Left anterior fascicular block Moderate voltage criteria for LVH, may be normal variant Abnormal ECG No significant change since last tracing Confirmed by Ward, Cyril Mourning [phone]) on 11/14/2017 3:40:57 AM          Ward, Delice Bison, DO 11/14/17 [id number]

## 2017-11-14 NOTE — ED Notes (Signed)
Unable to establish peripheral IV access despite several attempts , IV team consult ordered.

## 2017-11-14 NOTE — ED Notes (Signed)
Patient transported to CT scan . 

## 2017-11-14 NOTE — Discharge Instructions (Signed)
You may take Tylenol 1000 mg every 6 hours as needed for pain.  No sign of any fracture seen on imaging today.  No sign of heart attack or blood clot.   You did have some enlarged lymph nodes seen on your CT scan.  We recommend close follow-up with your primary care physician for this.

## 2017-11-14 NOTE — ED Notes (Signed)
Dr. Leonides Schanz ( EDP) explained tests results and plan of care to pt. and family.

## 2017-11-20 MED FILL — CALCIUM ACETATE 667 MG CAP: 667 | 30 days supply | Qty: 120 | Fill #0

## 2017-11-20 MED FILL — ATORVASTATIN CALCIUM 40 MG: 40 | 30 days supply | Qty: 30 | Fill #3

## 2017-11-20 MED FILL — GABAPENTIN 300 MG CAPSULE: 300 | 30 days supply | Qty: 60 | Fill #3

## 2017-11-29 ENCOUNTER — Other Ambulatory Visit: Payer: Self-pay

## 2017-11-29 DIAGNOSIS — E119 Type 2 diabetes mellitus without complications: Secondary | ICD-10-CM

## 2017-11-29 DIAGNOSIS — Z794 Long term (current) use of insulin: Principal | ICD-10-CM

## 2017-11-29 MED ORDER — INSULIN GLARGINE 100 UNIT/ML ~~LOC~~ SOLN: 15.0000 [IU] | Freq: Two times a day (BID) | SUBCUTANEOUS | 5 refills | Status: DC

## 2017-11-29 MED FILL — !LANTUS 100 UNITS/ML VIAL: 100 | 28 days supply | Qty: 10 | Fill #0

## 2017-11-29 MED FILL — ETHYL CHLORIDE SPRAY: 30 days supply | Qty: 104 | Fill #1

## 2017-11-30 MED FILL — VALSARTAN 160 MG TABS: 160 | 30 days supply | Qty: 30 | Fill #3

## 2017-12-03 ENCOUNTER — Other Ambulatory Visit: Payer: Self-pay

## 2017-12-03 DIAGNOSIS — T82510A Breakdown (mechanical) of surgically created arteriovenous fistula, initial encounter: Secondary | ICD-10-CM

## 2017-12-05 ENCOUNTER — Ambulatory Visit: Payer: Self-pay | Attending: Family Medicine

## 2017-12-10 ENCOUNTER — Other Ambulatory Visit (HOSPITAL_COMMUNITY): Payer: Self-pay | Admitting: Nephrology

## 2017-12-10 DIAGNOSIS — N186 End stage renal disease: Secondary | ICD-10-CM

## 2017-12-11 ENCOUNTER — Other Ambulatory Visit: Payer: Self-pay | Admitting: Radiology

## 2017-12-12 ENCOUNTER — Encounter (HOSPITAL_COMMUNITY): Payer: Self-pay | Admitting: Interventional Radiology

## 2017-12-12 ENCOUNTER — Ambulatory Visit (HOSPITAL_COMMUNITY)
Admission: RE | Admit: 2017-12-12 | Discharge: 2017-12-12 | Disposition: A | Discharge status: Home / Self Care | Payer: Self-pay | Source: Ambulatory Visit | Attending: Nephrology | Admitting: Nephrology

## 2017-12-12 ENCOUNTER — Other Ambulatory Visit (HOSPITAL_COMMUNITY): Payer: Self-pay | Admitting: Nephrology

## 2017-12-12 DIAGNOSIS — N186 End stage renal disease: Secondary | ICD-10-CM | POA: Insufficient documentation

## 2017-12-12 DIAGNOSIS — Z794 Long term (current) use of insulin: Secondary | ICD-10-CM | POA: Insufficient documentation

## 2017-12-12 DIAGNOSIS — E1140 Type 2 diabetes mellitus with diabetic neuropathy, unspecified: Secondary | ICD-10-CM | POA: Insufficient documentation

## 2017-12-12 DIAGNOSIS — E785 Hyperlipidemia, unspecified: Secondary | ICD-10-CM | POA: Insufficient documentation

## 2017-12-12 DIAGNOSIS — I120 Hypertensive chronic kidney disease with stage 5 chronic kidney disease or end stage renal disease: Secondary | ICD-10-CM | POA: Insufficient documentation

## 2017-12-12 DIAGNOSIS — Y832 Surgical operation with anastomosis, bypass or graft as the cause of abnormal reaction of the patient, or of later complication, without mention of misadventure at the time of the procedure: Secondary | ICD-10-CM | POA: Insufficient documentation

## 2017-12-12 DIAGNOSIS — T82858A Stenosis of vascular prosthetic devices, implants and grafts, initial encounter: Secondary | ICD-10-CM | POA: Insufficient documentation

## 2017-12-12 DIAGNOSIS — E1122 Type 2 diabetes mellitus with diabetic chronic kidney disease: Secondary | ICD-10-CM | POA: Insufficient documentation

## 2017-12-12 HISTORY — PX: IR US GUIDE VASC ACCESS LEFT: IMG2389

## 2017-12-12 HISTORY — PX: IR AV DIALY SHUNT INTRO NEEDLE/INTRACATH INITIAL W/PTA/IMG LEFT: IMG6103

## 2017-12-12 MED ORDER — LIDOCAINE HCL 1 % IJ SOLN
INTRAMUSCULAR | Status: AC
  Filled 2017-12-12: qty 20

## 2017-12-12 MED ORDER — FENTANYL CITRATE (PF) 100 MCG/2ML IJ SOLN
INTRAMUSCULAR | Status: AC
  Filled 2017-12-12: qty 2

## 2017-12-12 MED ORDER — IOPAMIDOL (ISOVUE-300) INJECTION 61%
INTRAVENOUS | Status: AC
  Administered 2017-12-12: 50 mL
  Filled 2017-12-12: qty 100

## 2017-12-12 MED ORDER — FENTANYL CITRATE (PF) 100 MCG/2ML IJ SOLN
INTRAMUSCULAR | Status: AC | PRN
  Administered 2017-12-12: 50 ug via INTRAVENOUS

## 2017-12-12 NOTE — Procedures (Signed)
Interventional Radiology Procedure Note  Procedure:  US guided access left UE dialysis circuit.  Venogram.  PTA of mx outflow stenosis, 58mm and 10mm .  Complications: None  Recommendations:  - OK to use - remove 2 stitches at next dialysis session - DC now  Signed,  Dulcy Fanny. Earleen Newport, DO

## 2017-12-12 NOTE — Sedation Documentation (Addendum)
Pt will not be getting conscious sedation; only needs IV Fentanyl for procedure. Pt ate breakfast, her dtr is with her and will drive her home. Pt has no IV access, pain meds will be given by MD once access is gained

## 2017-12-19 MED FILL — ATORVASTATIN CALCIUM 40 MG: 40 | 30 days supply | Qty: 30 | Fill #4

## 2017-12-19 MED FILL — DULoxetine HCL 60 MG CPEP: 60 | 30 days supply | Qty: 30 | Fill #4

## 2017-12-19 MED FILL — GABAPENTIN 300 MG CAPSULE: 300 | 30 days supply | Qty: 60 | Fill #4

## 2017-12-31 ENCOUNTER — Other Ambulatory Visit: Payer: Self-pay

## 2017-12-31 ENCOUNTER — Ambulatory Visit (INDEPENDENT_AMBULATORY_CARE_PROVIDER_SITE_OTHER): Payer: Self-pay | Admitting: Surgery

## 2017-12-31 ENCOUNTER — Ambulatory Visit (HOSPITAL_COMMUNITY)
Admission: RE | Admit: 2017-12-31 | Discharge: 2017-12-31 | Disposition: A | Discharge status: Home / Self Care | Payer: Self-pay | Source: Ambulatory Visit | Attending: Surgery | Admitting: Surgery

## 2017-12-31 ENCOUNTER — Encounter: Payer: Self-pay | Admitting: Surgery

## 2017-12-31 VITALS — BP 170/73 | HR 72 | Temp 98.3°F | Resp 14 | Ht 60.0 in | Wt 150.0 lb

## 2017-12-31 DIAGNOSIS — Z992 Dependence on renal dialysis: Secondary | ICD-10-CM

## 2017-12-31 DIAGNOSIS — T82510A Breakdown (mechanical) of surgically created arteriovenous fistula, initial encounter: Secondary | ICD-10-CM | POA: Insufficient documentation

## 2017-12-31 DIAGNOSIS — Y838 Other surgical procedures as the cause of abnormal reaction of the patient, or of later complication, without mention of misadventure at the time of the procedure: Secondary | ICD-10-CM | POA: Insufficient documentation

## 2017-12-31 DIAGNOSIS — M79603 Pain in arm, unspecified: Secondary | ICD-10-CM | POA: Insufficient documentation

## 2017-12-31 DIAGNOSIS — N186 End stage renal disease: Secondary | ICD-10-CM

## 2017-12-31 DIAGNOSIS — M7989 Other specified soft tissue disorders: Secondary | ICD-10-CM | POA: Insufficient documentation

## 2017-12-31 DIAGNOSIS — I82602 Acute embolism and thrombosis of unspecified veins of left upper extremity: Secondary | ICD-10-CM | POA: Insufficient documentation

## 2017-12-31 NOTE — Progress Notes (Signed)
[facility]  Patient name: Sarah Kelley MRN: [id number] DOB: Feb 11, 1951 Sex: female   REASON FOR VISIT:    Evaluate for possible steal syndrome  HISOTRY OF PRESENT ILLNESS:    Sarah Kelley is a 67 y.o. female, who is Spanish only speaking who dialyzes via a left upper arm brachiocephalic fistula which has previously undergone superficialization.  She states that there is a buzz or pulse around her arm crease which is bothersome.  She does not have any pain or numbness in her hand.   PAST MEDICAL HISTORY:   Past Medical History:  Diagnosis Date  . Diabetes mellitus   . Hyperlipidemia   . Hypertension   . Neuropathy    Diabetic  . Pneumonia 07/2016  . Renal disorder   . Thyroid disease      FAMILY HISTORY:   History reviewed. No pertinent family history.  SOCIAL HISTORY:   Social History   Tobacco Use  . Smoking status: Never Smoker  . Smokeless tobacco: Never Used  Substance Use Topics  . Alcohol use: No    Alcohol/week: 0.0 oz    Comment: rare- a beer every now and then     ALLERGIES:   Allergies  Allergen Reactions  . No Known Allergies      CURRENT MEDICATIONS:   Current Outpatient Medications  Medication Sig Dispense Refill  . atorvastatin (LIPITOR) 40 MG tablet Take 1 tablet (40 mg total) by mouth daily. 30 tablet 5  . Blood Glucose Monitoring Suppl (TRUE METRIX METER) DEVI 1 each by Does not apply route 3 (three) times daily before meals. 1 Device 0  . calcium acetate (PHOSLO) 667 MG capsule TAKE 1 CAPSULE BY MOUTH THREE TIMES DAILY WITH MEALS, AND 1 CAPSULE WITH SNACKS  5  . DULoxetine (CYMBALTA) 60 MG capsule Take 1 capsule (60 mg total) by mouth daily. 30 capsule 5  . ethyl chloride spray SPRAY 1 SPRAY TO SKIN THREE TIMES A WEEK AS DIRECTED SPRAY A SMALL AMOUNT JUST PRIOR TO NEEDLE INSERTION  11  . gabapentin (NEURONTIN) 300 MG capsule Take 2 capsules (600  mg total) by mouth at bedtime. 60 capsule 5  . glucose blood (TRUE METRIX BLOOD GLUCOSE TEST) test strip Use 3 times daily before meals 100 each 12  . insulin glargine (LANTUS) 100 UNIT/ML injection Inject 0.15 mLs (15 Units total) into the skin 2 (two) times daily. 30 mL 5  . Insulin Syringe-Needle U-100 (BD INSULIN SYRINGE ULTRAFINE) 31G X 15/64" 0.5 ML MISC 1 each by Does not apply route daily. 30 each 5  . TRUEPLUS LANCETS 28G MISC 1 each by Does not apply route 3 (three) times daily before meals. 100 each 12  . valsartan (DIOVAN) 160 MG tablet Take 1 tablet (160 mg total) by mouth daily. 30 tablet 5  . cephALEXin (KEFLEX) 500 MG capsule Take 1 capsule (500 mg total) by mouth 2 (two) times daily. (Patient not taking: Reported on 12/31/2017) 10 capsule 0  . oxyCODONE-acetaminophen (PERCOCET) 5-325 MG tablet Take 1 tablet by mouth every 4 (four) hours as needed for moderate pain. (Patient not taking: Reported on 12/31/2017) 15 tablet 0   No current facility-administered medications for this visit.     REVIEW OF SYSTEMS:   [X]  denotes positive finding, [ ]  denotes negative finding Cardiac  Comments:  Chest pain or chest pressure:    Shortness of breath upon exertion:    Short of breath when lying flat:    Irregular  heart rhythm:        Vascular    Pain in calf, thigh, or hip brought on by ambulation:    Pain in feet at night that wakes you up from your sleep:     Blood clot in your veins:    Leg swelling:         Pulmonary    Oxygen at home:    Productive cough:     Wheezing:         Neurologic    Sudden weakness in arms or legs:     Sudden numbness in arms or legs:     Sudden onset of difficulty speaking or slurred speech:    Temporary loss of vision in one eye:     Problems with dizziness:         Gastrointestinal    Blood in stool:     Vomited blood:         Genitourinary    Burning when urinating:     Blood in urine:        Psychiatric    Major depression:          Hematologic    Bleeding problems:    Problems with blood clotting too easily:        Skin    Rashes or ulcers:        Constitutional    Fever or chills:      PHYSICAL EXAM:   Vitals:   12/31/17 1544 12/31/17 1546  BP: (!) 163/74 (!) 170/73  Pulse: 72 72  Resp: 14   Temp: 98.3 F (36.8 C)   TempSrc: Oral   SpO2: 98%   Weight: 150 lb (68 kg)   Height: 5' (1.524 m)     GENERAL: The patient is a well-nourished female, in no acute distress. The vital signs are documented above. CARDIAC: There is a regular rate and rhythm.  VASCULAR: Excellent thrill within fistula.  She does not have a palpable left radial pulse. PULMONARY: Non-labored respirations MUSCULOSKELETAL: There are no major deformities or cyanosis. NEUROLOGIC: Excellent grip strength in her left hand.  No numbness. SKIN: There are no ulcers or rashes noted. PSYCHIATRIC: The patient has a normal affect.  STUDIES:   I have reviewed her fistula study.  There is a change in the velocities within her fistula from 55 cm/s to 84 cm/s with compression of the fistula.  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  MEDICAL ISSUES:   Based on her clinical exam as well as the ultrasound findings, I do not think that this clinical picture was consistent with steal syndrome.  There is a language barrier that may contribute to some of the inconsistencies.  The patient was examined with a Spanish interpreter.  All other questions were answered.  I do not recommend any intervention on her fistula.  I do not feel that she is having steal syndrome.    Annamarie Major, MD [facility] [phone] Pager [phone]

## 2018-01-04 MED FILL — VALSARTAN 160 MG TABLET: 160 | 30 days supply | Qty: 30 | Fill #4

## 2018-01-15 MED FILL — DULoxetine HCL 60 MG CPEP: 60 | 30 days supply | Qty: 30 | Fill #5

## 2018-01-18 MED FILL — ETHYL CHLORIDE SPRAY: 30 days supply | Qty: 104 | Fill #2

## 2018-01-22 MED FILL — GABAPENTIN 300 MG CAPSULE: 300 | 30 days supply | Qty: 60 | Fill #5

## 2018-02-10 ENCOUNTER — Emergency Department (HOSPITAL_COMMUNITY): Payer: Medicaid Other

## 2018-02-10 ENCOUNTER — Inpatient Hospital Stay (HOSPITAL_COMMUNITY)
Admission: EM | Admit: 2018-02-10 | Discharge: 2018-02-17 | DRG: 637 | Disposition: A | Discharge status: Home Health | Payer: Medicaid Other | Attending: Internal Medicine | Admitting: Internal Medicine

## 2018-02-10 ENCOUNTER — Encounter (HOSPITAL_COMMUNITY): Payer: Self-pay | Admitting: Emergency Medicine

## 2018-02-10 DIAGNOSIS — R1312 Dysphagia, oropharyngeal phase: Secondary | ICD-10-CM | POA: Diagnosis present

## 2018-02-10 DIAGNOSIS — R4182 Altered mental status, unspecified: Secondary | ICD-10-CM

## 2018-02-10 DIAGNOSIS — Z992 Dependence on renal dialysis: Secondary | ICD-10-CM

## 2018-02-10 DIAGNOSIS — Z89422 Acquired absence of other left toe(s): Secondary | ICD-10-CM

## 2018-02-10 DIAGNOSIS — I120 Hypertensive chronic kidney disease with stage 5 chronic kidney disease or end stage renal disease: Secondary | ICD-10-CM | POA: Diagnosis present

## 2018-02-10 DIAGNOSIS — F329 Major depressive disorder, single episode, unspecified: Secondary | ICD-10-CM | POA: Diagnosis present

## 2018-02-10 DIAGNOSIS — E119 Type 2 diabetes mellitus without complications: Secondary | ICD-10-CM

## 2018-02-10 DIAGNOSIS — G9341 Metabolic encephalopathy: Secondary | ICD-10-CM | POA: Diagnosis present

## 2018-02-10 DIAGNOSIS — R1084 Generalized abdominal pain: Secondary | ICD-10-CM | POA: Diagnosis present

## 2018-02-10 DIAGNOSIS — E1165 Type 2 diabetes mellitus with hyperglycemia: Principal | ICD-10-CM | POA: Diagnosis present

## 2018-02-10 DIAGNOSIS — R109 Unspecified abdominal pain: Secondary | ICD-10-CM | POA: Diagnosis present

## 2018-02-10 DIAGNOSIS — F05 Delirium due to known physiological condition: Secondary | ICD-10-CM | POA: Diagnosis not present

## 2018-02-10 DIAGNOSIS — E871 Hypo-osmolality and hyponatremia: Secondary | ICD-10-CM | POA: Diagnosis present

## 2018-02-10 DIAGNOSIS — R112 Nausea with vomiting, unspecified: Secondary | ICD-10-CM | POA: Diagnosis present

## 2018-02-10 DIAGNOSIS — F0390 Unspecified dementia without behavioral disturbance: Secondary | ICD-10-CM | POA: Diagnosis present

## 2018-02-10 DIAGNOSIS — R739 Hyperglycemia, unspecified: Secondary | ICD-10-CM

## 2018-02-10 DIAGNOSIS — Z794 Long term (current) use of insulin: Secondary | ICD-10-CM

## 2018-02-10 DIAGNOSIS — D638 Anemia in other chronic diseases classified elsewhere: Secondary | ICD-10-CM | POA: Diagnosis present

## 2018-02-10 DIAGNOSIS — I770 Arteriovenous fistula, acquired: Secondary | ICD-10-CM

## 2018-02-10 DIAGNOSIS — E1140 Type 2 diabetes mellitus with diabetic neuropathy, unspecified: Secondary | ICD-10-CM | POA: Diagnosis present

## 2018-02-10 DIAGNOSIS — A4901 Methicillin susceptible Staphylococcus aureus infection, unspecified site: Secondary | ICD-10-CM | POA: Diagnosis present

## 2018-02-10 DIAGNOSIS — I10 Essential (primary) hypertension: Secondary | ICD-10-CM | POA: Diagnosis present

## 2018-02-10 DIAGNOSIS — R7881 Bacteremia: Secondary | ICD-10-CM | POA: Diagnosis present

## 2018-02-10 DIAGNOSIS — J9811 Atelectasis: Secondary | ICD-10-CM | POA: Diagnosis present

## 2018-02-10 DIAGNOSIS — N186 End stage renal disease: Secondary | ICD-10-CM | POA: Diagnosis present

## 2018-02-10 DIAGNOSIS — E785 Hyperlipidemia, unspecified: Secondary | ICD-10-CM | POA: Diagnosis present

## 2018-02-10 DIAGNOSIS — N3000 Acute cystitis without hematuria: Secondary | ICD-10-CM

## 2018-02-10 DIAGNOSIS — E1149 Type 2 diabetes mellitus with other diabetic neurological complication: Secondary | ICD-10-CM

## 2018-02-10 DIAGNOSIS — N2581 Secondary hyperparathyroidism of renal origin: Secondary | ICD-10-CM | POA: Diagnosis present

## 2018-02-10 DIAGNOSIS — E1122 Type 2 diabetes mellitus with diabetic chronic kidney disease: Secondary | ICD-10-CM | POA: Diagnosis present

## 2018-02-10 DIAGNOSIS — R0789 Other chest pain: Secondary | ICD-10-CM | POA: Diagnosis not present

## 2018-02-10 DIAGNOSIS — Z79899 Other long term (current) drug therapy: Secondary | ICD-10-CM

## 2018-02-10 HISTORY — DX: End stage renal disease: N18.6

## 2018-02-10 HISTORY — DX: Sepsis, unspecified organism: A41.9

## 2018-02-10 LAB — COMPREHENSIVE METABOLIC PANEL
ALK PHOS: 137 U/L — AB (ref 38–126)
ALT: 18 U/L (ref 0–44)
AST: 24 U/L (ref 15–41)
Albumin: 3.3 g/dL — ABNORMAL LOW (ref 3.5–5.0)
Anion gap: 19 — ABNORMAL HIGH (ref 5–15)
BUN: 49 mg/dL — ABNORMAL HIGH (ref 8–23)
CALCIUM: 9.3 mg/dL (ref 8.9–10.3)
CO2: 23 mmol/L (ref 22–32)
CREATININE: 6.45 mg/dL — AB (ref 0.44–1.00)
Chloride: 88 mmol/L — ABNORMAL LOW (ref 98–111)
GFR calc Af Amer: 7 mL/min — ABNORMAL LOW (ref >60)
GFR calc non Af Amer: 6 mL/min — ABNORMAL LOW (ref >60)
Glucose, Bld: 749 mg/dL (ref 70–99)
Potassium: 5.3 mmol/L — ABNORMAL HIGH (ref 3.5–5.1)
SODIUM: 130 mmol/L — AB (ref 135–145)
Total Bilirubin: 0.8 mg/dL (ref 0.3–1.2)
Total Protein: 6.9 g/dL (ref 6.5–8.1)

## 2018-02-10 LAB — I-STAT VENOUS BLOOD GAS, ED
ACID-BASE EXCESS: 5 mmol/L — AB (ref 0.0–2.0)
Bicarbonate: 32 mmol/L — ABNORMAL HIGH (ref 20.0–28.0)
O2 SAT: 37 %
PH VEN: 7.344 (ref 7.250–7.430)
TCO2: 34 mmol/L — ABNORMAL HIGH (ref 22–32)
pCO2, Ven: 59.7 mmHg (ref 44.0–60.0)
pO2, Ven: 26 mmHg — CL (ref 32.0–45.0)

## 2018-02-10 LAB — I-STAT CG4 LACTIC ACID, ED: Lactic Acid, Venous: 2.43 mmol/L (ref 0.5–1.9)

## 2018-02-10 LAB — I-STAT TROPONIN, ED: TROPONIN I, POC: 0.37 ng/mL — AB (ref 0.00–0.08)

## 2018-02-10 LAB — CBC
HCT: 39.3 % (ref 36.0–46.0)
HEMOGLOBIN: 12.6 g/dL (ref 12.0–15.0)
MCH: 32.3 pg (ref 26.0–34.0)
MCHC: 32.1 g/dL (ref 30.0–36.0)
MCV: 100.8 fL — ABNORMAL HIGH (ref 78.0–100.0)
PLATELETS: 125 10*3/uL — AB (ref 150–400)
RBC: 3.9 MIL/uL (ref 3.87–5.11)
RDW: 13.8 % (ref 11.5–15.5)
WBC: 13.1 10*3/uL — AB (ref 4.0–10.5)

## 2018-02-10 LAB — CBG MONITORING, ED

## 2018-02-10 LAB — LIPASE, BLOOD: LIPASE: 49 U/L (ref 11–51)

## 2018-02-10 MED ORDER — SODIUM CHLORIDE 0.9 % IV SOLN
INTRAVENOUS | Status: DC
  Administered 2018-02-10: 5.4 [IU]/h via INTRAVENOUS
  Filled 2018-02-10: qty 1

## 2018-02-10 MED ORDER — ACETAMINOPHEN 650 MG RE SUPP
650.0000 mg | Freq: Once | RECTAL | Status: AC
  Administered 2018-02-10: 650 mg via RECTAL
  Filled 2018-02-10: qty 1

## 2018-02-10 MED ORDER — SODIUM CHLORIDE 0.9 % IV BOLUS
1000.0000 mL | Freq: Once | INTRAVENOUS | Status: AC
  Administered 2018-02-10: 1000 mL via INTRAVENOUS

## 2018-02-10 MED ORDER — SODIUM CHLORIDE 0.9 % IV BOLUS: 1000.0000 mL | Freq: Once | INTRAVENOUS | Status: DC

## 2018-02-10 MED ORDER — DEXTROSE-NACL 5-0.45 % IV SOLN
INTRAVENOUS | Status: DC
  Administered 2018-02-11 (×2): via INTRAVENOUS

## 2018-02-10 NOTE — ED Notes (Signed)
Attempted to gain IV access x2, without success. Pt restricted on left.

## 2018-02-10 NOTE — ED Triage Notes (Signed)
Per family, pt has been having trouble w/ diabetes, has been throwing up all day.  Per family she has not been responding to them, not able to stand.

## 2018-02-10 NOTE — ED Notes (Signed)
ED Provider at bedside. 

## 2018-02-10 NOTE — ED Provider Notes (Signed)
[facility] EMERGENCY DEPARTMENT Provider Note   CSN: [id number] Arrival date & time: 02/10/18  2045     History   Chief Complaint Chief Complaint  Patient presents with  . Altered Mental Status  . Hypoglycemia    HPI Sarah Kelley is a 67 y.o. female.  Patient with past medical history remarkable for ESRD, on dialysis (Tuesday, Thursday, Saturday), last dialyzed yesterday, diabetes, presents to the emergency department with a chief complaint of altered mental status.  She lives at home with her family.  Her family reports that she has been acutely altered since this morning.  They state that her blood sugar has been high.  They report that they have been unable to get an accurate reading because the meter says "high."  Family members deny any fever, chills, nausea, or vomiting.  They state that she has been complaining of pain in her left lower quadrant.  They deny any other associated symptoms.  Level 5 caveat applies secondary to altered mental status.  The history is provided by the patient. No language interpreter was used.    Past Medical History:  Diagnosis Date  . Diabetes mellitus   . Hyperlipidemia   . Hypertension   . Neuropathy    Diabetic  . Pneumonia 07/2016  . Renal disorder   . Thyroid disease     Patient Active Problem List   Diagnosis Date Noted  . Insomnia 01/18/2017  . Transient alteration of awareness   . Goals of care, counseling/discussion   . Oropharyngeal dysphagia 12/04/2016  . UTI (urinary tract infection) 12/04/2016  . Severe sepsis ([place]) 12/02/2016  . Hyperglycemia 12/02/2016  . Acute encephalopathy 07/28/2016  . CAP (community acquired pneumonia) 07/28/2016  . Pneumonia 07/28/2016  . ESRD (end stage renal disease) ([place])   . Phantom pain ([place]) 09/13/2015  . Hyperlipidemia 06/01/2015  . 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 Insulin-requiring or dependent type II diabetes mellitus ([place])   . Chronic kidney disease  (CKD) stage G4/A1, severely decreased glomerular filtration rate (GFR) between 15-29 mL/min/1.73 square meter and albuminuria creatinine ratio less than 30 mg/g (HCC) 05/18/2015  . Diabetic infection of left foot ([place]) 05/18/2015  . Osteomyelitis ([place]) 05/18/2015  . Abdominal pain 05/18/2015  . Osteomyelitis of left foot ([place]) 05/18/2015  . Diabetic neuropathy ([place]) 01/14/2007  . Diabetes mellitus without complication ([place]) 67/34/1937  . HLD (hyperlipidemia) 01/14/2007  . Normocytic anemia 01/14/2007  . ANXIETY 01/14/2007  . Depression 01/14/2007  . Essential hypertension 01/14/2007    Past Surgical History:  Procedure Laterality Date  . AMPUTATION Left 05/21/2015   Procedure: Left Foot 3rd Ray Amputation;  Surgeon: Newt Minion, MD;  Location: [place];  Service: Orthopedics;  Laterality: Left;  . AV FISTULA PLACEMENT Left 03/28/2016   Procedure: ARTERIOVENOUS (AV) FISTULA CREATION LEFT ARM;  Surgeon: Waynetta Sandy, MD;  Location: [place] [facility];  Service: Vascular;  Laterality: Left;  . DILATION AND CURETTAGE OF UTERUS    . FISTULA SUPERFICIALIZATION Left 09/04/2016   Procedure: FISTULA SUPERFICIALIZATION LEFT UPPER ARM;  Surgeon: Waynetta Sandy, MD;  Location: [facility] [facility] [place];  Service: Vascular;  Laterality: Left;  . IR AV DIALY SHUNT INTRO NEEDLE/INTRAC INITIAL W/PTA/STENT/IMG LT Left 06/13/2017  . IR AV DIALY SHUNT INTRO NEEDLE/INTRAC INITIAL W/PTA/STENT/IMG LT Left 09/12/2017  . IR AV DIALY SHUNT INTRO NEEDLE/INTRACATH INITIAL W/PTA/IMG LEFT  11/10/2016  . IR AV DIALY SHUNT INTRO NEEDLE/INTRACATH INITIAL W/PTA/IMG LEFT  03/09/2017  . IR AV DIALY SHUNT INTRO NEEDLE/INTRACATH  INITIAL W/PTA/IMG LEFT  12/12/2017  . IR GENERIC HISTORICAL  08/01/2016   IR FLUORO GUIDE CV LINE RIGHT 08/01/2016 [name], MD [facility]INTER[facility]  . IR GENERIC HISTORICAL  08/01/2016   IR US GUIDE VASC ACCESS RIGHT 08/01/2016 [name], MD [facility]  . IR REMOVAL TUN CV CATH W/O FL  11/01/2016  . IR US GUIDE VASC  ACCESS LEFT  11/10/2016  . IR US GUIDE VASC ACCESS LEFT  09/12/2017  . IR US GUIDE VASC ACCESS LEFT  12/12/2017     OB History   None      Home Medications    Prior to Admission medications   Medication Sig Start Date End Date Taking? Authorizing Provider  atorvastatin (LIPITOR) 40 MG tablet Take 1 tablet (40 mg total) by mouth daily. 07/25/17   [name], MD  Blood Glucose Monitoring Suppl (TRUE METRIX METER) DEVI 1 each by Does not apply route 3 (three) times daily before meals. 07/25/17   [name], MD  calcium acetate (PHOSLO) 667 MG capsule TAKE 1 CAPSULE BY MOUTH THREE TIMES DAILY WITH MEALS, AND 1 CAPSULE WITH SNACKS 11/20/17   [provider]  cephALEXin (KEFLEX) 500 MG capsule Take 1 capsule (500 mg total) by mouth 2 (two) times daily. Patient not taking: Reported on 01/08/9380 0/1/75   Delora Fuel, MD  DULoxetine (CYMBALTA) 60 MG capsule Take 1 capsule (60 mg total) by mouth daily. 07/25/17   [name], MD  ethyl chloride spray SPRAY 1 SPRAY TO SKIN THREE TIMES A WEEK AS DIRECTED SPRAY A SMALL AMOUNT JUST PRIOR TO NEEDLE INSERTION 11/29/17   [provider]  gabapentin (NEURONTIN) 300 MG capsule Take 2 capsules (600 mg total) by mouth at bedtime. 07/25/17   [name], MD  glucose blood (TRUE METRIX BLOOD GLUCOSE TEST) test strip Use 3 times daily before meals 07/25/17   [name], MD  insulin glargine (LANTUS) 100 UNIT/ML injection Inject 0.15 mLs (15 Units total) into the skin 2 (two) times daily. 11/29/17   [name], MD  Insulin Syringe-Needle U-100 (BD INSULIN SYRINGE ULTRAFINE) 31G X 15/64" 0.5 ML MISC 1 each by Does not apply route daily. 07/25/17   [name], MD  oxyCODONE-acetaminophen (PERCOCET) 5-325 MG tablet Take 1 tablet by mouth every 4 (four) hours as needed for moderate pain. Patient not taking: Reported on 1/0/2585 08/09/76   Delora Fuel, MD  TRUEPLUS LANCETS 28G MISC 1 each by Does not apply route 3 (three) times  daily before meals. 07/25/17   [name], MD  valsartan (DIOVAN) 160 MG tablet Take 1 tablet (160 mg total) by mouth daily. 07/25/17   [name], MD    Family History No family history on file.  Social History Social History   Tobacco Use  . Smoking status: Never Smoker  . Smokeless tobacco: Never Used  Substance Use Topics  . Alcohol use: No    Alcohol/week: 0.0 standard drinks    Comment: rare- a beer every now and then  . Drug use: No     Allergies   No known allergies   Review of Systems Review of Systems  All other systems reviewed and are negative.    Physical Exam Updated Vital Signs BP (!) 181/69   Pulse 85   Temp (!) 101.4 F (38.6 C) (Oral)   Resp (!) 24   LMP  (LMP Unknown)   SpO2 94%   Physical Exam  Constitutional: She appears well-developed and well-nourished.  HENT:  Head:  Normocephalic and atraumatic.  Eyes: Pupils are equal, round, and reactive to light. Conjunctivae and EOM are normal.  Neck: Normal range of motion. Neck supple.  Cardiovascular: Normal rate and regular rhythm. Exam reveals no gallop and no friction rub.  No murmur heard. Pulmonary/Chest: Effort normal and breath sounds normal. No respiratory distress. She has no wheezes. She has no rales. She exhibits no tenderness.  Abdominal: Soft. Bowel sounds are normal. She exhibits no distension and no mass. There is no tenderness. There is no rebound and no guarding.  Musculoskeletal: Normal range of motion. She exhibits no edema or tenderness.  Neurological:  GCS 11 E4V2M5  Skin: Skin is warm and dry.  Psychiatric: She has a normal mood and affect. Her behavior is normal. Judgment and thought content normal.  Nursing note and vitals reviewed.    ED Treatments / Results  Labs (all labs ordered are listed, but only abnormal results are displayed) Labs Reviewed  COMPREHENSIVE METABOLIC PANEL - Abnormal; Notable for the following components:      Result Value   Sodium  130 (*)    Potassium 5.3 (*)    Chloride 88 (*)    Glucose, Bld 749 (*)    BUN 49 (*)    Creatinine, Ser 6.45 (*)    Albumin 3.3 (*)    Alkaline Phosphatase 137 (*)    GFR calc non Af Amer 6 (*)    GFR calc Af Amer 7 (*)    Anion gap 19 (*)    All other components within normal limits  CBC - Abnormal; Notable for the following components:   WBC 13.1 (*)    MCV 100.8 (*)    Platelets 125 (*)    All other components within normal limits  URINALYSIS, ROUTINE W REFLEX MICROSCOPIC - Abnormal; Notable for the following components:   APPearance CLOUDY (*)    Glucose, UA >=500 (*)    Hgb urine dipstick MODERATE (*)    Protein, ur 100 (*)    Leukocytes, UA LARGE (*)    WBC, UA >50 (*)    Bacteria, UA FEW (*)    All other components within normal limits  CBG MONITORING, ED - Abnormal; Notable for the following components:   Glucose-Capillary >600 (*)    All other components within normal limits  I-STAT CG4 LACTIC ACID, ED - Abnormal; Notable for the following components:   Lactic Acid, Venous 2.43 (*)    All other components within normal limits  I-STAT VENOUS BLOOD GAS, ED - Abnormal; Notable for the following components:   pO2, Ven 26.0 (*)    Bicarbonate 32.0 (*)    TCO2 34 (*)    Acid-Base Excess 5.0 (*)    All other components within normal limits  I-STAT TROPONIN, ED - Abnormal; Notable for the following components:   Troponin i, poc 0.37 (*)    All other components within normal limits  URINE CULTURE  CULTURE, BLOOD (ROUTINE X 2)  CULTURE, BLOOD (ROUTINE X 2)  LIPASE, BLOOD  I-STAT CG4 LACTIC ACID, ED    EKG None  Radiology Dg Chest Port 1 View  Result Date: 02/10/2018 CLINICAL DATA:  Vomiting EXAM: PORTABLE CHEST 1 VIEW COMPARISON:  11/14/2017 FINDINGS: Cardiac shadow is at the upper limits of normal in size. The lungs are hypoinflated with mild left basilar atelectasis. No bony abnormality is seen. Stent is again noted in the left cephalic vein. IMPRESSION: Left  basilar atelectasis. Electronically Signed   By: Inez Catalina  M.D.   On: 02/10/2018 23:34    Procedures Procedures (including critical care time) CRITICAL CARE Performed by: Montine Circle   Total critical care time: 48 minutes  Critical care time was exclusive of separately billable procedures and treating other patients.  Critical care was necessary to treat or prevent imminent or life-threatening deterioration.  Critical care was time spent personally by me on the following activities: development of treatment plan with patient and/or surrogate as well as nursing, discussions with consultants, evaluation of patient's response to treatment, examination of patient, obtaining history from patient or surrogate, ordering and performing treatments and interventions, ordering and review of laboratory studies, ordering and review of radiographic studies, pulse oximetry and re-evaluation of patient's condition.  Medications Ordered in ED Medications  acetaminophen (TYLENOL) suppository 650 mg (has no administration in time range)  sodium chloride 0.9 % bolus 1,000 mL (has no administration in time range)     Initial Impression / Assessment and Plan / ED Course  I have reviewed the triage vital signs and the nursing notes.  Pertinent labs & imaging results that were available during my care of the patient were reviewed by me and considered in my medical decision making (see chart for details).    Patient with altered mental status.  Has been altered since this morning.  Glucose is 749.  Anion gap is 19.  VBG is pending.  Patient noted to be febrile to 101.4.  She did complain of left lower quadrant abdominal pain to her family members.  Check urinalysis, consider also a CT.  We will start glucose stabilizer given significant hyperglycemia and elevated anion gap.  Patient seen by discussed with Dr. Sedonia Small.  Peripheral IV placed by Dr. Sedonia Small.  Patient started on IV insulin given a liter of  fluid.  Last dialysis was yesterday.  Potassium is 5.3.  Urinalysis is consistent with infection.  I believe this to be the source of her fever, and likely subsequent hyperglycemia.  Will treat with Rocephin.  Patient will need to be admitted.  Appreciate Dr. Maudie Mercury for admitting the patient to the hospital.   Final Clinical Impressions(s) / ED Diagnoses   Final diagnoses:  Hyperglycemia  Acute cystitis without hematuria  Altered mental status, unspecified altered mental status type    ED Discharge Orders    None       Montine Circle, PA-C 02/11/18 0059    Maudie Flakes, MD 02/11/18 [phone]

## 2018-02-10 NOTE — ED Notes (Signed)
PA Rob informed of troponin .37, Lactic acid 2.43 and venous blood gas results

## 2018-02-10 NOTE — ED Notes (Signed)
CBG= greater than 600

## 2018-02-11 ENCOUNTER — Inpatient Hospital Stay (HOSPITAL_COMMUNITY): Payer: Medicaid Other

## 2018-02-11 ENCOUNTER — Encounter (HOSPITAL_COMMUNITY): Payer: Self-pay | Admitting: Internal Medicine

## 2018-02-11 DIAGNOSIS — Z89422 Acquired absence of other left toe(s): Secondary | ICD-10-CM | POA: Diagnosis not present

## 2018-02-11 DIAGNOSIS — Z992 Dependence on renal dialysis: Secondary | ICD-10-CM | POA: Diagnosis not present

## 2018-02-11 DIAGNOSIS — R1032 Left lower quadrant pain: Secondary | ICD-10-CM

## 2018-02-11 DIAGNOSIS — F329 Major depressive disorder, single episode, unspecified: Secondary | ICD-10-CM | POA: Diagnosis present

## 2018-02-11 DIAGNOSIS — R112 Nausea with vomiting, unspecified: Secondary | ICD-10-CM | POA: Diagnosis present

## 2018-02-11 DIAGNOSIS — E1122 Type 2 diabetes mellitus with diabetic chronic kidney disease: Secondary | ICD-10-CM | POA: Diagnosis present

## 2018-02-11 DIAGNOSIS — R0789 Other chest pain: Secondary | ICD-10-CM | POA: Diagnosis not present

## 2018-02-11 DIAGNOSIS — J9811 Atelectasis: Secondary | ICD-10-CM | POA: Diagnosis present

## 2018-02-11 DIAGNOSIS — E1140 Type 2 diabetes mellitus with diabetic neuropathy, unspecified: Secondary | ICD-10-CM | POA: Diagnosis present

## 2018-02-11 DIAGNOSIS — E785 Hyperlipidemia, unspecified: Secondary | ICD-10-CM | POA: Diagnosis present

## 2018-02-11 DIAGNOSIS — N3000 Acute cystitis without hematuria: Secondary | ICD-10-CM | POA: Diagnosis present

## 2018-02-11 DIAGNOSIS — F05 Delirium due to known physiological condition: Secondary | ICD-10-CM | POA: Diagnosis not present

## 2018-02-11 DIAGNOSIS — D638 Anemia in other chronic diseases classified elsewhere: Secondary | ICD-10-CM | POA: Diagnosis present

## 2018-02-11 DIAGNOSIS — R1312 Dysphagia, oropharyngeal phase: Secondary | ICD-10-CM | POA: Diagnosis present

## 2018-02-11 DIAGNOSIS — A4901 Methicillin susceptible Staphylococcus aureus infection, unspecified site: Secondary | ICD-10-CM | POA: Diagnosis present

## 2018-02-11 DIAGNOSIS — I120 Hypertensive chronic kidney disease with stage 5 chronic kidney disease or end stage renal disease: Secondary | ICD-10-CM | POA: Diagnosis present

## 2018-02-11 DIAGNOSIS — G9341 Metabolic encephalopathy: Secondary | ICD-10-CM | POA: Diagnosis present

## 2018-02-11 DIAGNOSIS — Z794 Long term (current) use of insulin: Secondary | ICD-10-CM | POA: Diagnosis not present

## 2018-02-11 DIAGNOSIS — E1165 Type 2 diabetes mellitus with hyperglycemia: Secondary | ICD-10-CM | POA: Diagnosis present

## 2018-02-11 DIAGNOSIS — Z79899 Other long term (current) drug therapy: Secondary | ICD-10-CM | POA: Diagnosis not present

## 2018-02-11 DIAGNOSIS — F0390 Unspecified dementia without behavioral disturbance: Secondary | ICD-10-CM | POA: Diagnosis present

## 2018-02-11 DIAGNOSIS — N186 End stage renal disease: Secondary | ICD-10-CM | POA: Diagnosis present

## 2018-02-11 DIAGNOSIS — R7881 Bacteremia: Secondary | ICD-10-CM | POA: Diagnosis present

## 2018-02-11 DIAGNOSIS — N2581 Secondary hyperparathyroidism of renal origin: Secondary | ICD-10-CM | POA: Diagnosis present

## 2018-02-11 DIAGNOSIS — E871 Hypo-osmolality and hyponatremia: Secondary | ICD-10-CM | POA: Diagnosis present

## 2018-02-11 LAB — GLUCOSE, CAPILLARY
GLUCOSE-CAPILLARY: 484 mg/dL — AB (ref 70–99)
GLUCOSE-CAPILLARY: 396 mg/dL — AB (ref 70–99)
GLUCOSE-CAPILLARY: 232 mg/dL — AB (ref 70–99)
GLUCOSE-CAPILLARY: 157 mg/dL — AB (ref 70–99)
GLUCOSE-CAPILLARY: 150 mg/dL — AB (ref 70–99)
Glucose-Capillary: 174 mg/dL — ABNORMAL HIGH (ref 70–99)
Glucose-Capillary: 140 mg/dL — ABNORMAL HIGH (ref 70–99)
Glucose-Capillary: 135 mg/dL — ABNORMAL HIGH (ref 70–99)
Glucose-Capillary: 152 mg/dL — ABNORMAL HIGH (ref 70–99)
Glucose-Capillary: 181 mg/dL — ABNORMAL HIGH (ref 70–99)
Glucose-Capillary: 148 mg/dL — ABNORMAL HIGH (ref 70–99)
Glucose-Capillary: 148 mg/dL — ABNORMAL HIGH (ref 70–99)
Glucose-Capillary: 108 mg/dL — ABNORMAL HIGH (ref 70–99)
Glucose-Capillary: 123 mg/dL — ABNORMAL HIGH (ref 70–99)
Glucose-Capillary: 131 mg/dL — ABNORMAL HIGH (ref 70–99)
Glucose-Capillary: 155 mg/dL — ABNORMAL HIGH (ref 70–99)
Glucose-Capillary: 132 mg/dL — ABNORMAL HIGH (ref 70–99)

## 2018-02-11 LAB — BASIC METABOLIC PANEL
ANION GAP: 15 (ref 5–15)
BUN: 58 mg/dL — AB (ref 8–23)
CO2: 23 mmol/L (ref 22–32)
Calcium: 9 mg/dL (ref 8.9–10.3)
Chloride: 98 mmol/L (ref 98–111)
Creatinine, Ser: 7.62 mg/dL — ABNORMAL HIGH (ref 0.44–1.00)
GFR calc Af Amer: 6 mL/min — ABNORMAL LOW (ref >60)
GFR calc non Af Amer: 5 mL/min — ABNORMAL LOW (ref >60)
GLUCOSE: 116 mg/dL — AB (ref 70–99)
POTASSIUM: 5.4 mmol/L — AB (ref 3.5–5.1)
Sodium: 136 mmol/L (ref 135–145)

## 2018-02-11 LAB — BLOOD CULTURE ID PANEL (REFLEXED)
ACINETOBACTER BAUMANNII: NOT DETECTED
CANDIDA ALBICANS: NOT DETECTED
CANDIDA KRUSEI: NOT DETECTED
Candida glabrata: NOT DETECTED
Candida parapsilosis: NOT DETECTED
Candida tropicalis: NOT DETECTED
ENTEROBACTER CLOACAE COMPLEX: NOT DETECTED
ENTEROBACTERIACEAE SPECIES: NOT DETECTED
ENTEROCOCCUS SPECIES: NOT DETECTED
ESCHERICHIA COLI: NOT DETECTED
Haemophilus influenzae: NOT DETECTED
KLEBSIELLA OXYTOCA: NOT DETECTED
Klebsiella pneumoniae: NOT DETECTED
Listeria monocytogenes: NOT DETECTED
Methicillin resistance: NOT DETECTED
Neisseria meningitidis: NOT DETECTED
PSEUDOMONAS AERUGINOSA: NOT DETECTED
Proteus species: NOT DETECTED
SERRATIA MARCESCENS: NOT DETECTED
STAPHYLOCOCCUS AUREUS BCID: DETECTED — AB
STREPTOCOCCUS PNEUMONIAE: NOT DETECTED
STREPTOCOCCUS PYOGENES: NOT DETECTED
Staphylococcus species: DETECTED — AB
Streptococcus agalactiae: NOT DETECTED
Streptococcus species: NOT DETECTED

## 2018-02-11 LAB — COMPREHENSIVE METABOLIC PANEL
ALBUMIN: 2.8 g/dL — AB (ref 3.5–5.0)
ALK PHOS: 106 U/L (ref 38–126)
ALT: 17 U/L (ref 0–44)
AST: 24 U/L (ref 15–41)
Anion gap: 18 — ABNORMAL HIGH (ref 5–15)
BILIRUBIN TOTAL: 0.7 mg/dL (ref 0.3–1.2)
BUN: 56 mg/dL — AB (ref 8–23)
CALCIUM: 9.3 mg/dL (ref 8.9–10.3)
CO2: 21 mmol/L — ABNORMAL LOW (ref 22–32)
CREATININE: 7.33 mg/dL — AB (ref 0.44–1.00)
Chloride: 99 mmol/L (ref 98–111)
GFR calc Af Amer: 6 mL/min — ABNORMAL LOW (ref >60)
GFR, EST NON AFRICAN AMERICAN: 5 mL/min — AB (ref >60)
GLUCOSE: 162 mg/dL — AB (ref 70–99)
Potassium: 5.1 mmol/L (ref 3.5–5.1)
Sodium: 138 mmol/L (ref 135–145)
TOTAL PROTEIN: 6.5 g/dL (ref 6.5–8.1)

## 2018-02-11 LAB — CBC
HEMATOCRIT: 34.1 % — AB (ref 36.0–46.0)
HEMOGLOBIN: 11.4 g/dL — AB (ref 12.0–15.0)
MCH: 31.9 pg (ref 26.0–34.0)
MCHC: 33.4 g/dL (ref 30.0–36.0)
MCV: 95.5 fL (ref 78.0–100.0)
Platelets: 117 10*3/uL — ABNORMAL LOW (ref 150–400)
RBC: 3.57 MIL/uL — ABNORMAL LOW (ref 3.87–5.11)
RDW: 13.9 % (ref 11.5–15.5)
WBC: 18 10*3/uL — ABNORMAL HIGH (ref 4.0–10.5)

## 2018-02-11 LAB — URINALYSIS, ROUTINE W REFLEX MICROSCOPIC
BILIRUBIN URINE: NEGATIVE
KETONES UR: NEGATIVE mg/dL
NITRITE: NEGATIVE
PROTEIN: 100 mg/dL — AB
Specific Gravity, Urine: 1.012 (ref 1.005–1.030)
WBC, UA: >50 WBC/hpf — ABNORMAL HIGH (ref 0–5)
pH: 8 (ref 5.0–8.0)

## 2018-02-11 LAB — TROPONIN I
TROPONIN I: 0.51 ng/mL — AB (ref <0.03)
TROPONIN I: 0.56 ng/mL — AB (ref <0.03)
Troponin I: 0.69 ng/mL (ref <0.03)

## 2018-02-11 LAB — CBG MONITORING, ED: GLUCOSE-CAPILLARY: 559 mg/dL — AB (ref 70–99)

## 2018-02-11 LAB — CREATININE, SERUM
Creatinine, Ser: 6.79 mg/dL — ABNORMAL HIGH (ref 0.44–1.00)
GFR calc Af Amer: 6 mL/min — ABNORMAL LOW (ref >60)
GFR, EST NON AFRICAN AMERICAN: 6 mL/min — AB (ref >60)

## 2018-02-11 LAB — I-STAT CG4 LACTIC ACID, ED: Lactic Acid, Venous: 2.91 mmol/L (ref 0.5–1.9)

## 2018-02-11 LAB — HIV ANTIBODY (ROUTINE TESTING W REFLEX): HIV SCREEN 4TH GENERATION: NONREACTIVE

## 2018-02-11 LAB — MRSA PCR SCREENING: MRSA BY PCR: NEGATIVE

## 2018-02-11 MED ORDER — GABAPENTIN 300 MG PO CAPS
300.0000 mg | ORAL_CAPSULE | Freq: Every day | ORAL | Status: DC
  Administered 2018-02-11 – 2018-02-16 (×6): 300 mg via ORAL
  Filled 2018-02-11 (×6): qty 1

## 2018-02-11 MED ORDER — HYOSCYAMINE SULFATE 0.125 MG SL SUBL
0.2500 mg | SUBLINGUAL_TABLET | Freq: Once | SUBLINGUAL | Status: AC
  Administered 2018-02-11: 0.25 mg via SUBLINGUAL
  Filled 2018-02-11: qty 2

## 2018-02-11 MED ORDER — IRBESARTAN 300 MG PO TABS
150.0000 mg | ORAL_TABLET | Freq: Every day | ORAL | Status: DC
  Administered 2018-02-11 – 2018-02-17 (×6): 150 mg via ORAL
  Filled 2018-02-11 (×7): qty 1

## 2018-02-11 MED ORDER — SODIUM CHLORIDE 0.9 % IV SOLN
2.0000 g | INTRAVENOUS | Status: DC
  Administered 2018-02-12: 2 g via INTRAVENOUS
  Filled 2018-02-11 (×2): qty 20

## 2018-02-11 MED ORDER — SODIUM CHLORIDE 0.9% FLUSH: 3.0000 mL | INTRAVENOUS | Status: DC | PRN

## 2018-02-11 MED ORDER — INSULIN ASPART 100 UNIT/ML ~~LOC~~ SOLN
0.0000 [IU] | Freq: Every day | SUBCUTANEOUS | Status: DC
  Administered 2018-02-12: 3 [IU] via SUBCUTANEOUS

## 2018-02-11 MED ORDER — RENA-VITE PO TABS
1.0000 | ORAL_TABLET | Freq: Every day | ORAL | Status: DC
  Administered 2018-02-11 – 2018-02-16 (×6): 1 via ORAL
  Filled 2018-02-11 (×6): qty 1

## 2018-02-11 MED ORDER — SODIUM CHLORIDE 0.9 % IV SOLN: 250.0000 mL | INTRAVENOUS | Status: DC | PRN

## 2018-02-11 MED ORDER — SEVELAMER CARBONATE 800 MG PO TABS
800.0000 mg | ORAL_TABLET | Freq: Three times a day (TID) | ORAL | Status: DC
  Administered 2018-02-11 – 2018-02-17 (×15): 800 mg via ORAL
  Filled 2018-02-11 (×14): qty 1

## 2018-02-11 MED ORDER — INSULIN ASPART 100 UNIT/ML ~~LOC~~ SOLN
0.0000 [IU] | Freq: Three times a day (TID) | SUBCUTANEOUS | Status: DC
  Administered 2018-02-11: 3 [IU] via SUBCUTANEOUS
  Administered 2018-02-12: 2 [IU] via SUBCUTANEOUS
  Administered 2018-02-12 – 2018-02-16 (×6): 3 [IU] via SUBCUTANEOUS
  Administered 2018-02-16: 2 [IU] via SUBCUTANEOUS
  Administered 2018-02-17: 3 [IU] via SUBCUTANEOUS
  Administered 2018-02-17: 5 [IU] via SUBCUTANEOUS

## 2018-02-11 MED ORDER — SODIUM CHLORIDE 0.9 % IV SOLN
1.0000 g | INTRAVENOUS | Status: DC
  Filled 2018-02-11: qty 10

## 2018-02-11 MED ORDER — INSULIN GLARGINE 100 UNIT/ML ~~LOC~~ SOLN
15.0000 [IU] | Freq: Two times a day (BID) | SUBCUTANEOUS | Status: DC
  Administered 2018-02-11 – 2018-02-13 (×5): 15 [IU] via SUBCUTANEOUS
  Filled 2018-02-11 (×6): qty 0.15

## 2018-02-11 MED ORDER — LORATADINE 10 MG PO TABS
10.0000 mg | ORAL_TABLET | Freq: Every day | ORAL | Status: DC
  Administered 2018-02-11 – 2018-02-17 (×7): 10 mg via ORAL
  Filled 2018-02-11 (×7): qty 1

## 2018-02-11 MED ORDER — SODIUM CHLORIDE 0.9 % IV SOLN
1.0000 g | Freq: Once | INTRAVENOUS | Status: AC
  Administered 2018-02-11: 1 g via INTRAVENOUS
  Filled 2018-02-11: qty 10

## 2018-02-11 MED ORDER — ONDANSETRON HCL 4 MG/2ML IJ SOLN
4.0000 mg | Freq: Four times a day (QID) | INTRAMUSCULAR | Status: DC | PRN
  Administered 2018-02-11 – 2018-02-12 (×2): 4 mg via INTRAVENOUS
  Filled 2018-02-11 (×2): qty 2

## 2018-02-11 MED ORDER — ATORVASTATIN CALCIUM 40 MG PO TABS
40.0000 mg | ORAL_TABLET | Freq: Every day | ORAL | Status: DC
  Administered 2018-02-11 – 2018-02-17 (×7): 40 mg via ORAL
  Filled 2018-02-11 (×7): qty 1

## 2018-02-11 MED ORDER — ACETAMINOPHEN 325 MG PO TABS
650.0000 mg | ORAL_TABLET | Freq: Four times a day (QID) | ORAL | Status: DC | PRN
  Administered 2018-02-11 – 2018-02-15 (×8): 650 mg via ORAL
  Filled 2018-02-11 (×8): qty 2

## 2018-02-11 MED ORDER — ACETAMINOPHEN 650 MG RE SUPP: 650.0000 mg | Freq: Four times a day (QID) | RECTAL | Status: DC | PRN

## 2018-02-11 MED ORDER — HEPARIN SODIUM (PORCINE) 5000 UNIT/ML IJ SOLN
5000.0000 [IU] | Freq: Three times a day (TID) | INTRAMUSCULAR | Status: DC
  Administered 2018-02-11 – 2018-02-17 (×17): 5000 [IU] via SUBCUTANEOUS
  Filled 2018-02-11 (×17): qty 1

## 2018-02-11 MED ORDER — GABAPENTIN 300 MG PO CAPS: 600.0000 mg | ORAL_CAPSULE | Freq: Every day | ORAL | Status: DC

## 2018-02-11 MED ORDER — SODIUM CHLORIDE 0.9% FLUSH
3.0000 mL | Freq: Two times a day (BID) | INTRAVENOUS | Status: DC
  Administered 2018-02-11: 3 mL via INTRAVENOUS

## 2018-02-11 MED ORDER — CHLORHEXIDINE GLUCONATE CLOTH 2 % EX PADS
6.0000 | MEDICATED_PAD | Freq: Every day | CUTANEOUS | Status: DC
  Administered 2018-02-12 – 2018-02-15 (×4): 6 via TOPICAL

## 2018-02-11 MED ORDER — DULOXETINE HCL 60 MG PO CPEP
60.0000 mg | ORAL_CAPSULE | Freq: Every day | ORAL | Status: DC
  Administered 2018-02-11 – 2018-02-17 (×7): 60 mg via ORAL
  Filled 2018-02-11 (×7): qty 1

## 2018-02-11 MED ORDER — HYDROCODONE-ACETAMINOPHEN 5-325 MG PO TABS
2.0000 | ORAL_TABLET | Freq: Once | ORAL | Status: AC
  Administered 2018-02-11: 2 via ORAL
  Filled 2018-02-11: qty 2

## 2018-02-11 MED FILL — ATORVASTATIN CALCIUM 40 MG: 40 | 30 days supply | Qty: 30 | Fill #5

## 2018-02-11 MED FILL — VALSARTAN 160 MG TABLET: 160 | 30 days supply | Qty: 30 | Fill #5

## 2018-02-11 NOTE — Progress Notes (Signed)
Patient given lantus 15 units at 1700, drip turned off at 1900 and patient given 3 units novolog per sliding scale orders. Patient sleepy and did not want to eat dinner, family given a nepro shake to encourage patient to drink.

## 2018-02-11 NOTE — Progress Notes (Signed)
When reviewing orders it was noted that patient did not have scheduled BMET labs per the DKA flowsheet. MD [place] paged, and new orders received. Will continue to monitor.

## 2018-02-11 NOTE — Progress Notes (Signed)
[facility] Progress Note  Subjective: NO CHARGE ( pt admitted after MN).  Pt still confused, BS's much better now and AG resolved.  Seen by diab coordinator.  On HD 1.5- 2 yrs per family.  Wants to eat.    Vitals:   02/11/18 0706 02/11/18 0800 02/11/18 1200 02/11/18 1430  BP: (!) 115/49     Pulse: 82 84 85 91  Resp: 16 16 19 11   Temp:      TempSrc:      SpO2: 93% 94% 92% 93%  Weight:      Height:        Inpatient medications: . atorvastatin  40 mg Oral Daily  . [START ON 02/12/2018] Chlorhexidine Gluconate Cloth  6 each Topical Q0600  . DULoxetine  60 mg Oral Daily  . gabapentin  600 mg Oral QHS  . heparin  5,000 Units Subcutaneous Q8H  . irbesartan  150 mg Oral Daily  . loratadine  10 mg Oral Daily  . multivitamin  1 tablet Oral QHS  . sevelamer carbonate  800 mg Oral TID WC   . [START ON 02/12/2018] cefTRIAXone (ROCEPHIN)  IV    . dextrose 5 % and 0.45% NaCl 50 mL/hr at 02/11/18 1400  . insulin (NOVOLIN-R) infusion 0.6 mL/hr at 02/11/18 1400   acetaminophen **OR** acetaminophen, ondansetron (ZOFRAN) IV  Exam:  older adult female, no distress, tired but responsive  +JVD   chest cta bilat no rales  cor reg no mrg  abd soft obese ntnd no ascites  ext trace LE edema   lua AVF+bruit  confused, nonfocal, alert   HPI Summary:  Sarah Kelley  is a 67 y.o. female, w hypertension, hyperlipidemia, Dm2, ESRD on HD (T, T, S), apparently c/o LLQ pain starting yesterday along with n/v but no bloody emesis. Pt family states that she had a waffle yesterday and then her sugar became unreadably high.  Pt denies fever, chills, cough, cp, palp, sob, diarrhea, brbpr, black stool, dysuria, hematuria.  In ED CXR showed  IMPRESSION: Left basilar atelectasis. Na 130, K 5.3, Bun 49, Creatinine 6.45 Ast 24, Alt 18 Alk phos 137, T. Bili 0.8 Lipase 49 Glucose 749 Wbc 13.1, Hgb 12.6, Plt 125 PH 7.344, pCo2 59.7, pO2 26 Trop 0.37 LA 2.43 Urinalysis wbc >50, Rbc  11-20 Pt will be admitted for n/v, abd pain LLQ, uti, and hyperglycemia. And troponin elevation        Impression/ Plan:  Principal Problem:   Hyperglycemia Active Problems:   Essential hypertension   Abdominal pain   ESRD (end stage renal disease) (HCC)   Nausea & vomiting  N/V/ abd pain/ fevers: suspected UTI w/ pyuria and bladder wall thickening by CT  - CT scan abd/ pelvis negative except for bladder wall thickening  - cont Rocephin IV 1 gm q 24 hrs  - fevers improving  - f/u UCx results  AMS: remains confused but more responsive today per family - cont observation - is swallowing whole pills, will start diet soft renal / carb mod  Uncontrolled DM2: resolved - will dc insulin drip - start her on home dose lantus 15 u bid + SSI ac and hs  ESRD: on HD T, T, S - seen by renal service - HD tomorrow  Volume: SOB today, have dc'd her IVF"s - for HD tomorrow  Hypertension - cont home Diovan 193m po qday   Diabetic neuropathy - Cont cymbalta 669mpo qday - Cont Neurontin lower to 300 mg po qhs  Hyperlipidmeia Cont Lipitor 19m po qhs   RKelly SplinterMD [facility] pgr ([phone]/06/2018, 4:06 PM   DVT Prophylaxis: Heparin  - SCDs  Family Communication: multiple family members at bedside  Code Status:  FULL CODE  Condition: Fair    Consults called:  renal   Admission status: inpatient    Recent Labs  Lab 02/10/18 2106 02/11/18 0228 02/11/18 0940 02/11/18 1418  NA 130*  --  138 136  K 5.3*  --  5.1 5.4*  CL 88*  --  99 98  CO2 23  --  21* 23  GLUCOSE 749*  --  162* 116*  BUN 49*  --  56* 58*  CREATININE 6.45* 6.79* 7.33* 7.62*  CALCIUM 9.3  --  9.3 9.0   Recent Labs  Lab 02/10/18 2106 02/11/18 0940  AST 24 24  ALT 18 17  ALKPHOS 137* 106  BILITOT 0.8 0.7  PROT 6.9 6.5  ALBUMIN 3.3* 2.8*   Recent Labs  Lab 02/10/18 2106 02/11/18 0733  WBC 13.1* 18.0*  HGB 12.6 11.4*  HCT 39.3 34.1*  MCV  100.8* 95.5  PLT 125* 117*   Iron/TIBC/Ferritin/ %Sat    Component Value Date/Time   IRON 20 (L) 08/01/2016 0843   TIBC 167 (L) 08/01/2016 0843   FERRITIN 245 08/01/2016 0843   IRONPCTSAT 12 08/01/2016 07949

## 2018-02-11 NOTE — Progress Notes (Signed)
Pt arrived [place]. Alert and oriented to self and place. Identified appropriately. Grandson at the bedside. Pt speaks Spanish, interpretor used to complete assessment. Pt had some trouble concentrating and answering question to interpretor.  VS stable, no signs of acute distress. Pt c/o abdominal pain, MD paged for PRN meds.  Cardiac monitor placed and CCMD notified. Patient and family oriented to room and equipment and instructed on how to use call bell. Understanding verbalized. Bed alarm in place. Will continue to monitor and treat pt per MD orders.

## 2018-02-11 NOTE — Progress Notes (Signed)
PHARMACY - PHYSICIAN COMMUNICATION CRITICAL VALUE ALERT - BLOOD CULTURE IDENTIFICATION (BCID)  Sarah Kelley is an 67 y.o. female who presented to [facility] on 02/10/2018 with a chief complaint of N/V/D and fevers.   Assessment:  Patient has a h/o of CKD on HD being treated for suspected UTI. 1/3 BCx grew MSSA on BCID  Name of physician (or Provider) Contacted: Dr. Jonnie Finner  Current antibiotics: Ceftriaxone 1 gm IV Q 24 hours   Changes to prescribed antibiotics recommended:  No antibiotic changes needed. Will increase ceftriaxone dose to 2 gm IV Q 24 hours per protocol  Results for orders placed or performed during the hospital encounter of 02/10/18  Blood Culture ID Panel (Reflexed) (Collected: 02/10/2018 11:06 PM)  Result Value Ref Range   Enterococcus species NOT DETECTED NOT DETECTED   Listeria monocytogenes NOT DETECTED NOT DETECTED   Staphylococcus species DETECTED (A) NOT DETECTED   Staphylococcus aureus DETECTED (A) NOT DETECTED   Methicillin resistance NOT DETECTED NOT DETECTED   Streptococcus species NOT DETECTED NOT DETECTED   Streptococcus agalactiae NOT DETECTED NOT DETECTED   Streptococcus pneumoniae NOT DETECTED NOT DETECTED   Streptococcus pyogenes NOT DETECTED NOT DETECTED   Acinetobacter baumannii NOT DETECTED NOT DETECTED   Enterobacteriaceae species NOT DETECTED NOT DETECTED   Enterobacter cloacae complex NOT DETECTED NOT DETECTED   Escherichia coli NOT DETECTED NOT DETECTED   Klebsiella oxytoca NOT DETECTED NOT DETECTED   Klebsiella pneumoniae NOT DETECTED NOT DETECTED   Proteus species NOT DETECTED NOT DETECTED   Serratia marcescens NOT DETECTED NOT DETECTED   Haemophilus influenzae NOT DETECTED NOT DETECTED   Neisseria meningitidis NOT DETECTED NOT DETECTED   Pseudomonas aeruginosa NOT DETECTED NOT DETECTED   Candida albicans NOT DETECTED NOT DETECTED   Candida glabrata NOT DETECTED NOT DETECTED   Candida krusei NOT DETECTED NOT DETECTED   Candida parapsilosis NOT DETECTED NOT DETECTED   Candida tropicalis NOT DETECTED NOT DETECTED    Albertina Parr, PharmD., BCPS Clinical Pharmacist Clinical phone for 02/11/18 until 3:30pm: [phone] If after 3:30pm, please refer to [facility] for unit-specific pharmacist

## 2018-02-11 NOTE — ED Notes (Signed)
Dr Christy Gentles informed of lactic acid results 2.91

## 2018-02-11 NOTE — Progress Notes (Signed)
Called to patient room by NT, patient telling family that she feels short of breath. MD made aware, IV fluids turned down and patient placed on 2L [place], lung sounds are clear. Will continue to monitor.

## 2018-02-11 NOTE — Progress Notes (Addendum)
Inpatient Diabetes Program Recommendations  AACE/ADA: New Consensus Statement on Inpatient Glycemic Control (2015)  Target Ranges:  Prepandial:   less than 140 mg/dL      Peak postprandial:   less than 180 mg/dL (1-2 hours)      Critically ill patients:  140 - 180 mg/dL    Spoke to patient's daughter at bedside. She reports the patient lives with her brother and the siblings take turns caring for her in addition to a caregiver they pay. Her brother checks her glucose and gives her medications. Daughter reports patient goes to a clinic across the street [facility]) and uses the pharmacy there. Last visit in chart was 07/25/17. Patient was listed to be on Lantus 15 units BID for DM. Daughter thought patient was on a pill as well, however, I did not see that in the chart.  Patient was suppose to return in 3 months. Do not see an encounter for that time.  Hard to pinpoint why her glucose levels increased significantly but could be due to UTI.  Patient is transitioning from IV insulin at this time. Please consider Lantus 12 units BID (20% reduction in home dose). Also consider Novolog 0-9 units tid.  Consider an A1c level to determine glucose control over the past 2-3 months. Last A1c was around 8% in January  Thanks,  Tama Headings RN, MSN, BC-ADM Inpatient Diabetes Coordinator Team Pager [phone] (8a-5p)

## 2018-02-11 NOTE — Consult Note (Addendum)
[facility] Renal Consultation Note    Indication for Consultation:  Management of ESRD/hemodialysis; anemia, hypertension/volume and secondary hyperparathyroidism PCP: Dr. Collene Mares Nephrologist: Dr. Marval Regal  HPI: Sarah Kelley is a 67 y.o. female with ESRD on hemodialysis T,Th,S at [facility]. PMH significant for DM, HTN, PNA, dementia, HLD, AOCD, SHPT. Last hemodialysis 02/09/2018. Left slightly above OP EDW. No issues reported during treatment. Patient is compliant with HD prescription. Non-English speaking patient.   Patient presented to ED 02/10/2018 with family who reported LLQ abdominal pain, high blood sugars and altered mental status. Temperature on arrival to ED 101.4 BP 181/69 HR 85 RR 24.  BS on arrival to ED 749 Co2 23 AG 19 K+ 55.3 Scr 6.45 BUN 49 lactic acid 2.43 WBC HGB 12.6 PLT 125. UA WBC > 50 Nitrite negative. CXR showed L basilar atelectasis. BC/Urine cultures were done and she was started on ceftriaxone. Patient was started on insulin drip and admitted for hyperglycemia, abdominal pain, possible UTI. CT of Abd/pelvis done this AM showed small bilateral pleural effusions, bladder wall thickening, moderate infraumbilical abdominal wall hernia, small R. Inguinal hernia.   Patient seen with assistance from interpretor. Weekday caregiver at bedside who unfortunately does not know what happened that brought patient to ED. Patient is currently confused, talking about when she was young, not responding appropriately to questions. Patient does continue to complain of LLQ pain, no nausea and vomiting at present. Rest of HPI obtained from EMR and nursing staff.   Past Medical History:  Diagnosis Date  . Diabetes mellitus   . ESRD (end stage renal disease) ([place])   . Hyperlipidemia   . Hypertension   . Neuropathy    Diabetic  . Pneumonia 07/2016  . Renal disorder   . Thyroid disease    Past Surgical History:  Procedure Laterality Date   . AMPUTATION Left 05/21/2015   Procedure: Left Foot 3rd Ray Amputation;  Surgeon: Newt Minion, MD;  Location: [place];  Service: Orthopedics;  Laterality: Left;  . AV FISTULA PLACEMENT Left 03/28/2016   Procedure: ARTERIOVENOUS (AV) FISTULA CREATION LEFT ARM;  Surgeon: Waynetta Sandy, MD;  Location: [place];  Service: Vascular;  Laterality: Left;  . DILATION AND CURETTAGE OF UTERUS    . FISTULA SUPERFICIALIZATION Left 09/04/2016   Procedure: FISTULA SUPERFICIALIZATION LEFT UPPER ARM;  Surgeon: Waynetta Sandy, MD;  Location: [place];  Service: Vascular;  Laterality: Left;  . IR AV DIALY SHUNT INTRO NEEDLE/INTRAC INITIAL W/PTA/STENT/IMG LT Left 06/13/2017  . IR AV DIALY SHUNT INTRO NEEDLE/INTRAC INITIAL W/PTA/STENT/IMG LT Left 09/12/2017  . IR AV DIALY SHUNT INTRO NEEDLE/INTRACATH INITIAL W/PTA/IMG LEFT  11/10/2016  . IR AV DIALY SHUNT INTRO NEEDLE/INTRACATH INITIAL W/PTA/IMG LEFT  03/09/2017  . IR AV DIALY SHUNT INTRO NEEDLE/INTRACATH INITIAL W/PTA/IMG LEFT  12/12/2017  . IR GENERIC HISTORICAL  08/01/2016   IR FLUORO GUIDE CV LINE RIGHT 08/01/2016 [name], MD [facility]  . IR GENERIC HISTORICAL  08/01/2016   IR US GUIDE VASC ACCESS RIGHT 08/01/2016 [name], MD [facility]  . IR REMOVAL TUN CV CATH W/O FL  11/01/2016  . IR US GUIDE VASC ACCESS LEFT  11/10/2016  . IR US GUIDE VASC ACCESS LEFT  09/12/2017  . IR US GUIDE VASC ACCESS LEFT  12/12/2017   History reviewed. No pertinent family history. Social History:  reports that she has never smoked. She has never used smokeless tobacco. She reports that she does not drink alcohol or use drugs.  Allergies  Allergen Reactions  . No Known Allergies    Prior to Admission medications   Medication Sig Start Date End Date Taking? Authorizing Provider  atorvastatin (LIPITOR) 40 MG tablet Take 1 tablet (40 mg total) by mouth daily. 07/25/17  Yes [name], MD  cetirizine (ZYRTEC) 10 MG tablet Take 10 mg by mouth daily.   Yes [provider]  DULoxetine (CYMBALTA) 60 MG capsule Take 1 capsule (60 mg total) by mouth daily. 07/25/17  Yes [name], MD  gabapentin (NEURONTIN) 300 MG capsule Take 2 capsules (600 mg total) by mouth at bedtime. 07/25/17  Yes Newlin, Charlane Ferretti, MD  insulin glargine (LANTUS) 100 UNIT/ML injection Inject 0.15 mLs (15 Units total) into the skin 2 (two) times daily. 11/29/17  Yes [name], MD  valsartan (DIOVAN) 160 MG tablet Take 1 tablet (160 mg total) by mouth daily. 07/25/17  Yes [name], MD  Blood Glucose Monitoring Suppl (TRUE METRIX METER) DEVI 1 each by Does not apply route 3 (three) times daily before meals. 07/25/17   [name], MD  cephALEXin (KEFLEX) 500 MG capsule Take 1 capsule (500 mg total) by mouth 2 (two) times daily. Patient not taking: Reported on 11/01/8839 12/06/04   Delora Fuel, MD  glucose blood (TRUE METRIX BLOOD GLUCOSE TEST) test strip Use 3 times daily before meals 07/25/17   [name], MD  Insulin Syringe-Needle U-100 (BD INSULIN SYRINGE ULTRAFINE) 31G X 15/64" 0.5 ML MISC 1 each by Does not apply route daily. 07/25/17   [name], MD  oxyCODONE-acetaminophen (PERCOCET) 5-325 MG tablet Take 1 tablet by mouth every 4 (four) hours as needed for moderate pain. Patient not taking: Reported on 3/0/1601 0/9/32   Delora Fuel, MD  TRUEPLUS LANCETS 28G MISC 1 each by Does not apply route 3 (three) times daily before meals. 07/25/17   [name], MD   Current Facility-Administered Medications  Medication Dose Route Frequency Provider Last Rate Last Dose  . 0.9 %  sodium chloride infusion  250 mL Intravenous PRN Schorr, Rhetta Mura, NP      . acetaminophen (TYLENOL) tablet 650 mg  650 mg Oral Q6H PRN Jani Gravel, MD       Or  . acetaminophen (TYLENOL) suppository 650 mg  650 mg Rectal Q6H PRN Jani Gravel, MD      . atorvastatin (LIPITOR) tablet 40 mg  40 mg Oral Daily Jani Gravel, MD   40 mg at 02/11/18 1000  . [START ON 02/12/2018] cefTRIAXone  (ROCEPHIN) 1 g in sodium chloride 0.9 % 100 mL IVPB  1 g Intravenous Q24H Jani Gravel, MD      . dextrose 5 %-0.45 % sodium chloride infusion   Intravenous Continuous Roney Jaffe, MD 50 mL/hr at 02/11/18 1124    . DULoxetine (CYMBALTA) DR capsule 60 mg  60 mg Oral Daily Jani Gravel, MD   60 mg at 02/11/18 1000  . gabapentin (NEURONTIN) capsule 600 mg  600 mg Oral QHS Jani Gravel, MD      . heparin injection 5,000 Units  5,000 Units Subcutaneous Q8H Jani Gravel, MD   5,000 Units at 02/11/18 [phone]  . insulin regular (NOVOLIN R,HUMULIN R) 100 Units in sodium chloride 0.9 % 100 mL (1 Units/mL) infusion   Intravenous Continuous Montine Circle, PA-C 1.8 mL/hr at 02/11/18 1124    . irbesartan (AVAPRO) tablet 150 mg  150 mg Oral Daily Jani Gravel, MD   150 mg at 02/11/18 1000  . loratadine (CLARITIN) tablet 10 mg  10 mg Oral Daily Jani Gravel, MD   10 mg at 02/11/18 1000  . ondansetron (ZOFRAN) injection 4 mg  4 mg Intravenous Q6H PRN Jani Gravel, MD      . sodium chloride flush (NS) 0.9 % injection 3 mL  3 mL Intravenous Q12H Jani Gravel, MD   3 mL at 02/11/18 1000  . sodium chloride flush (NS) 0.9 % injection 3 mL  3 mL Intravenous PRN Jani Gravel, MD       Labs: Basic Metabolic Panel: Recent Labs  Lab 02/10/18 2106 02/11/18 0228 02/11/18 0940  NA 130*  --  138  K 5.3*  --  5.1  CL 88*  --  99  CO2 23  --  21*  GLUCOSE 749*  --  162*  BUN 49*  --  56*  CREATININE 6.45* 6.79* 7.33*  CALCIUM 9.3  --  9.3   Liver Function Tests: Recent Labs  Lab 02/10/18 2106 02/11/18 0940  AST 24 24  ALT 18 17  ALKPHOS 137* 106  BILITOT 0.8 0.7  PROT 6.9 6.5  ALBUMIN 3.3* 2.8*   Recent Labs  Lab 02/10/18 2106  LIPASE 49   No results for input(s): AMMONIA in the last 168 hours. CBC: Recent Labs  Lab 02/10/18 2106 02/11/18 0733  WBC 13.1* 18.0*  HGB 12.6 11.4*  HCT 39.3 34.1*  MCV 100.8* 95.5  PLT 125* 117*   Cardiac Enzymes: Recent Labs  Lab 02/11/18 0228 02/11/18 0940  TROPONINI  0.51* 0.69*   CBG: Recent Labs  Lab 02/11/18 0732 02/11/18 0913 02/11/18 1024 02/11/18 1123 02/11/18 1221  GLUCAP 135* 152* 181* 148* 148*   Iron Studies: No results for input(s): IRON, TIBC, TRANSFERRIN, FERRITIN in the last 72 hours. Studies/Results: Ct Abdomen Pelvis Wo Contrast  Result Date: 02/11/2018 CLINICAL DATA:  Abdominal pain. Vomiting. Difficulty with diabetes. EXAM: CT ABDOMEN AND PELVIS WITHOUT CONTRAST TECHNIQUE: Multidetector CT imaging of the abdomen and pelvis was performed following the standard protocol without IV contrast. COMPARISON:  07/28/2016 FINDINGS: Lower chest: Small bilateral pleural effusions with basilar atelectasis. Improved since previous study. Small esophageal hiatal hernia. Coronary artery calcifications. Hepatobiliary: No focal liver abnormality is seen. No gallstones, gallbladder wall thickening, or biliary dilatation. Pancreas: Unremarkable. No pancreatic ductal dilatation or surrounding inflammatory changes. Spleen: Normal in size without focal abnormality. Adrenals/Urinary Tract: No adrenal gland nodules. Bilateral renal atrophy. No hydronephrosis or hydroureter. No renal, ureteral, or bladder stones. Bladder wall is diffusely thickened with small amount of gas in the bladder. This could represent cystitis. Stomach/Bowel: Stomach, small bowel, and colon are not abnormally distended. Stool fills the colon. Small bowel are decompressed. No wall thickening is identified although under distention limits evaluation of bowel wall. Appendix is not identified. Vascular/Lymphatic: Aortic atherosclerosis. No enlarged abdominal or pelvic lymph nodes. Reproductive: Uterus and bilateral adnexa are unremarkable. Other: There is a moderate-sized infraumbilical abdominal wall hernia at the midline containing fat. No free air or free fluid in the abdomen. Small right inguinal hernia containing fat. Musculoskeletal: No acute or significant osseous findings. IMPRESSION: 1.  Small bilateral pleural effusions with basilar atelectasis. Small esophageal hiatal hernia. Bilateral renal atrophy. Bladder wall thickening with small amount of gas in the bladder. This could represent cystitis. 2. Moderate-sized infraumbilical abdominal wall hernia containing fat. Small right inguinal hernia containing fat. Electronically Signed   By: Lucienne Capers M.D.   On: 02/11/2018 04:48   Dg Chest Port 1 View  Result Date: 02/10/2018 CLINICAL DATA:  Vomiting EXAM:  PORTABLE CHEST 1 VIEW COMPARISON:  11/14/2017 FINDINGS: Cardiac shadow is at the upper limits of normal in size. The lungs are hypoinflated with mild left basilar atelectasis. No bony abnormality is seen. Stent is again noted in the left cephalic vein. IMPRESSION: Left basilar atelectasis. Electronically Signed   By: Inez Catalina M.D.   On: 02/10/2018 23:34    ROS: As per HPI otherwise negative.  Physical Exam: Vitals:   02/11/18 0213 02/11/18 0706 02/11/18 0800 02/11/18 1200  BP: 139/64 (!) 115/49    Pulse: 79 82 84 85  Resp: 17 16 16 19   Temp: 98.4 F (36.9 C)     TempSrc: Oral     SpO2: 96% 93% 94% 92%  Weight:      Height:         General: Well developed, well nourished, in no acute distress. Head: Normocephalic, atraumatic, sclera non-icteric, mucus membranes are moist Neck: Supple. JVD not elevated. Lungs: Bilateral breath sounds decreased in bases, otherwise CTAB. No WOB.  Heart: RRR with S1 S2 2/6 systolic M, No rubs, or gallops appreciated. Abdomen: Tender to palpation LLQ. Active BS No rebound/guarding. No obvious abdominal masses. M-S:  Strength and tone appear normal for age. Lower extremities:without edema or ischemic changes, no open wounds  Neuro: Confused, talking about when she was young. Oriented to person only at present.  Psych:  Confused, cooperative.  Dialysis Access: LUA AVF + bruit  Dialysis Orders: [place] T,Th,S 4 hrs 180 NRe 400/800 66.5 kg 2.0L/2.25 Ca UFP 2 LUA AVF -Heparin 2000  units IV TIW -Hectorol 2 mcg IV TIW  Assessment/Plan: 1.  DKA-per primary. On insulin gtt, BS down 162 last AG 18.  2.  Abdominal pain/N & V-per primary 3.  Possible UTI-urine culture pending 4.  ESRD -  T,Th,S via AVF. K+ 5.1. HD tomorrow on schedule.  5.  Hypertension/volume  - Not overtly volume overloaded. Wt 75 kg, doubt accuracy. Re-weight patient-UFG 2.5-3 liters tomorrow. BP controlled at present. Continue Valsartan 160 mg PO daily (has been substituted for irbesartan 150 mg).  6.  Anemia  - HGB 11.4 No ESA needed.  7.  Metabolic bone disease -  Ca 9.3 C Ca 10.3. On Calcium acetate binders-change to renvela 800 mg PO TID, continue VDRA.  8.  Nutrition -Albumin 2.8 NPO at present. Renal/Carb mod diet when able to eat, prostat and renal vit. 9. DM-as noted above-per primary 10. H/O dementia/depression-per primary  Rita H. Owens Shark, NP-C 02/11/2018, 12:27 PM  [place] [phone]  Renal Attending: I agree with note as articulated above, we will support with HD Erling Cruz, MD

## 2018-02-11 NOTE — Progress Notes (Signed)
CRITICAL VALUE ALERT  Critical Value:  Troponin 0.51  Date & Time Notied:  02/11/18 at [place]  Provider Notified: Schorr, NP  Orders Received/Actions taken: No new orders at this time

## 2018-02-11 NOTE — H&P (Signed)
TRH H&P   Patient Demographics:    Sarah Kelley, is a 67 y.o. female  MRN: [id number]   DOB - 08/15/1950  Admit Date - 02/10/2018  Outpatient Primary MD for the patient is [name], MD  Referring MD/NP/PA:   Montine Circle  Outpatient Specialists:     Patient coming from: home  Chief Complaint  Patient presents with  . Altered Mental Status  . Hypoglycemia      HPI:    Sarah Kelley  is a 67 y.o. female, w hypertension, hyperlipidemia, Dm2, ESRD on HD (T, T, S), apparently c/o LLQ pain starting yesterday along with n/v but no bloody emesis. Pt family states that she had a waffle yesterday and then her sugar became unreadably high.  Pt denies fever, chills, cough, cp, palp, sob, diarrhea, brbpr, black stool, dysuria, hematuria.   In ED,  CXR IMPRESSION: Left basilar atelectasis.  Na 130, K 5.3, Bun 49, Creatinine 6.45 Ast 24, Alt 18 Alk phos 137, T. Bili 0.8 Lipase 49 Glucose 749  Wbc 13.1, Hgb 12.6, Plt 125  PH 7.344, pCo2 59.7, pO2 26  Trop 0.37 LA 2.43  Urinalysis wbc >50, Rbc 11-20  Pt will be admitted for n/v, abd pain LLQ, uti, and hyperglycemia. And troponin elevation          Review of systems:    In addition to the HPI above,  No Fever-chills, No Headache, No changes with Vision or hearing, No problems swallowing food or Liquids, No Chest pain, Cough or Shortness of Breath, No dysuria, No new skin rashes or bruises, No new joints pains-aches,  No new weakness, tingling, numbness in any extremity, No recent weight gain or loss, No polyuria, polydypsia or polyphagia, No significant Mental Stressors.  A full 10 point Review of Systems was done, except as stated above, all other Review of Systems were negative.   With Past History of the following :    Past Medical History:  Diagnosis Date  . Diabetes  mellitus   . Hyperlipidemia   . Hypertension   . Neuropathy    Diabetic  . Pneumonia 07/2016  . Renal disorder   . Thyroid disease       Past Surgical History:  Procedure Laterality Date  . AMPUTATION Left 05/21/2015   Procedure: Left Foot 3rd Ray Amputation;  Surgeon: Newt Minion, MD;  Location: [facility];  Service: Orthopedics;  Laterality: Left;  . AV FISTULA PLACEMENT Left 03/28/2016   Procedure: ARTERIOVENOUS (AV) FISTULA CREATION LEFT ARM;  Surgeon: Waynetta Sandy, MD;  Location: [facility];  Service: Vascular;  Laterality: Left;  . DILATION AND CURETTAGE OF UTERUS    . FISTULA SUPERFICIALIZATION Left 09/04/2016   Procedure: FISTULA SUPERFICIALIZATION LEFT UPPER ARM;  Surgeon: Waynetta Sandy, MD;  Location: [facility];  Service: Vascular;  Laterality: Left;  . IR AV DIALY SHUNT INTRO NEEDLE/INTRAC INITIAL  W/PTA/STENT/IMG LT Left 06/13/2017  . IR AV DIALY SHUNT INTRO NEEDLE/INTRAC INITIAL W/PTA/STENT/IMG LT Left 09/12/2017  . IR AV DIALY SHUNT INTRO NEEDLE/INTRACATH INITIAL W/PTA/IMG LEFT  11/10/2016  . IR AV DIALY SHUNT INTRO NEEDLE/INTRACATH INITIAL W/PTA/IMG LEFT  03/09/2017  . IR AV DIALY SHUNT INTRO NEEDLE/INTRACATH INITIAL W/PTA/IMG LEFT  12/12/2017  . IR GENERIC HISTORICAL  08/01/2016   IR FLUORO GUIDE CV LINE RIGHT 08/01/2016 [name], MD [facility]  . IR GENERIC HISTORICAL  08/01/2016   IR US GUIDE VASC ACCESS RIGHT 08/01/2016 [name], MD [facility]  . IR REMOVAL TUN CV CATH W/O FL  11/01/2016  . IR US GUIDE VASC ACCESS LEFT  11/10/2016  . IR US GUIDE VASC ACCESS LEFT  09/12/2017  . IR US GUIDE VASC ACCESS LEFT  12/12/2017      Social History:     Social History   Tobacco Use  . Smoking status: Never Smoker  . Smokeless tobacco: Never Used  Substance Use Topics  . Alcohol use: No    Alcohol/week: 0.0 standard drinks    Comment: rare- a beer every now and then     Lives - at home  Mobility - walks by self   Family History :    History reviewed. No  pertinent family history.  +Diabetes   Home Medications:   Prior to Admission medications   Medication Sig Start Date End Date Taking? Authorizing Provider  atorvastatin (LIPITOR) 40 MG tablet Take 1 tablet (40 mg total) by mouth daily. 07/25/17  Yes [name], MD  cetirizine (ZYRTEC) 10 MG tablet Take 10 mg by mouth daily.   Yes [provider]  DULoxetine (CYMBALTA) 60 MG capsule Take 1 capsule (60 mg total) by mouth daily. 07/25/17  Yes [name], MD  gabapentin (NEURONTIN) 300 MG capsule Take 2 capsules (600 mg total) by mouth at bedtime. 07/25/17  Yes Newlin, Charlane Ferretti, MD  insulin glargine (LANTUS) 100 UNIT/ML injection Inject 0.15 mLs (15 Units total) into the skin 2 (two) times daily. 11/29/17  Yes [name], MD  valsartan (DIOVAN) 160 MG tablet Take 1 tablet (160 mg total) by mouth daily. 07/25/17  Yes [name], MD  Blood Glucose Monitoring Suppl (TRUE METRIX METER) DEVI 1 each by Does not apply route 3 (three) times daily before meals. 07/25/17   [name], MD  cephALEXin (KEFLEX) 500 MG capsule Take 1 capsule (500 mg total) by mouth 2 (two) times daily. Patient not taking: Reported on 10/07/8030 07/04/22   Delora Fuel, MD  glucose blood (TRUE METRIX BLOOD GLUCOSE TEST) test strip Use 3 times daily before meals 07/25/17   [name], MD  Insulin Syringe-Needle U-100 (BD INSULIN SYRINGE ULTRAFINE) 31G X 15/64" 0.5 ML MISC 1 each by Does not apply route daily. 07/25/17   [name], MD  oxyCODONE-acetaminophen (PERCOCET) 5-325 MG tablet Take 1 tablet by mouth every 4 (four) hours as needed for moderate pain. Patient not taking: Reported on 02/01/5002 7/0/48   Delora Fuel, MD  TRUEPLUS LANCETS 28G MISC 1 each by Does not apply route 3 (three) times daily before meals. 07/25/17   [name], MD     Allergies:     Allergies  Allergen Reactions  . No Known Allergies      Physical Exam:   Vitals  Blood pressure (!) 129/48, pulse 81,  temperature 99 F (37.2 C), temperature source Oral, resp. rate (!) 29, height _0  (1.575 m), weight 75 kg, SpO2 94 %.  1. General  lying in bed in NAD,    2. Normal affect and insight, Not Suicidal or Homicidal, Awake Alert, Oriented X 3.  3. No F.N deficits, ALL C.Nerves Intact, Strength 5/5 all 4 extremities, Sensation intact all 4 extremities, Plantars down going.  4. Ears and Eyes appear Normal, Conjunctivae clear, PERRLA. Moist Oral Mucosa.  5. Supple Neck, No JVD, No cervical lymphadenopathy appriciated, No Carotid Bruits.  6. Symmetrical Chest wall movement, Good air movement bilaterally, CTAB.  7. RRR, No Gallops, Rubs or Murmurs, No Parasternal Heave.  8. Positive Bowel Sounds, Abdomen Soft, No tenderness, No organomegaly appriciated,No rebound -guarding or rigidity.  9.  No Cyanosis, Normal Skin Turgor, No Skin Rash or Bruise.  10. Good muscle tone,  joints appear normal , no effusions, Normal ROM.  11. No Palpable Lymph Nodes in Neck or Axillae    Data Review:    CBC Recent Labs  Lab 02/10/18 2106  WBC 13.1*  HGB 12.6  HCT 39.3  PLT 125*  MCV 100.8*  MCH 32.3  MCHC 32.1  RDW 13.8   ------------------------------------------------------------------------------------------------------------------  Chemistries  Recent Labs  Lab 02/10/18 2106  NA 130*  K 5.3*  CL 88*  CO2 23  GLUCOSE 749*  BUN 49*  CREATININE 6.45*  CALCIUM 9.3  AST 24  ALT 18  ALKPHOS 137*  BILITOT 0.8   ------------------------------------------------------------------------------------------------------------------ estimated creatinine clearance is 7.9 mL/min (A) (by C-G formula based on SCr of 6.45 mg/dL (H)). ------------------------------------------------------------------------------------------------------------------ No results for input(s): TSH, T4TOTAL, T3FREE, THYROIDAB in the last 72 hours.  Invalid input(s): FREET3  Coagulation profile No results for  input(s): INR, PROTIME in the last 168 hours. ------------------------------------------------------------------------------------------------------------------- No results for input(s): DDIMER in the last 72 hours. -------------------------------------------------------------------------------------------------------------------  Cardiac Enzymes No results for input(s): CKMB, TROPONINI, MYOGLOBIN in the last 168 hours.  Invalid input(s): CK ------------------------------------------------------------------------------------------------------------------ No results found for: BNP   ---------------------------------------------------------------------------------------------------------------  Urinalysis    Component Value Date/Time   COLORURINE YELLOW 02/10/2018 2342   APPEARANCEUR CLOUDY (A) 02/10/2018 2342   LABSPEC 1.012 02/10/2018 2342   PHURINE 8.0 02/10/2018 2342   GLUCOSEU >=500 (A) 02/10/2018 2342   HGBUR MODERATE (A) 02/10/2018 2342   BILIRUBINUR NEGATIVE 02/10/2018 2342   BILIRUBINUR small 03/27/2017 [place] 02/10/2018 2342   PROTEINUR 100 (A) 02/10/2018 2342   UROBILINOGEN 0.2 03/27/2017 1051   UROBILINOGEN 0.2 12/31/2008 1556   NITRITE NEGATIVE 02/10/2018 2342   LEUKOCYTESUR LARGE (A) 02/10/2018 2342    ----------------------------------------------------------------------------------------------------------------   Imaging Results:    Dg Chest Port 1 View  Result Date: 02/10/2018 CLINICAL DATA:  Vomiting EXAM: PORTABLE CHEST 1 VIEW COMPARISON:  11/14/2017 FINDINGS: Cardiac shadow is at the upper limits of normal in size. The lungs are hypoinflated with mild left basilar atelectasis. No bony abnormality is seen. Stent is again noted in the left cephalic vein. IMPRESSION: Left basilar atelectasis. Electronically Signed   By: Inez Catalina M.D.   On: 02/10/2018 23:34    ekg nsr at 76, LAD, RBBB and LAFB   Assessment & Plan:    Principal  Problem:   Hyperglycemia Active Problems:   Essential hypertension   Abdominal pain   ESRD (end stage renal disease) (HCC)   Nausea & vomiting  N/v, abd pain Check CT scan abd/ pelvis  UTI Await urine culture Rocephin 1gm iv qday  Hyperglycemia Iv insulin til, bs <250 then use [place] insulin  ESRD on HD T, T, S Please call nephrology in AM to place  on dialysis list  Hypertension Cont Diovan 132m po qday  DM2 See above  Diabetic neuropathy Cont cymbalta 682mpo qday Cont Neurontin 60063mo qhs  Hyperlipidmeia Cont Lipitor 29m57m qhs    DVT Prophylaxis Heparin -  - SCDs  AM Labs Ordered, also please review Full Orders  Family Communication: Admission, patients condition and plan of care including tests being ordered have been discussed with the patient who indicate understanding and agree with the plan and Code Status.  Code Status  FULL CODE  Likely DC to  home  Condition GUARDED    Consults called:  Please call nephrology in am  Admission status: inpatient  Time spent in minutes : 60   JameJani Gravel on 02/11/2018 at 1:30 AM  Between 7am to 7pm - Pager - [phone] 7pm go to [email or web address] - password [email or web address] - Office  [phone]

## 2018-02-12 ENCOUNTER — Inpatient Hospital Stay (HOSPITAL_COMMUNITY): Payer: Medicaid Other

## 2018-02-12 DIAGNOSIS — A4901 Methicillin susceptible Staphylococcus aureus infection, unspecified site: Secondary | ICD-10-CM

## 2018-02-12 DIAGNOSIS — E1122 Type 2 diabetes mellitus with diabetic chronic kidney disease: Secondary | ICD-10-CM

## 2018-02-12 DIAGNOSIS — N186 End stage renal disease: Secondary | ICD-10-CM

## 2018-02-12 DIAGNOSIS — B9561 Methicillin susceptible Staphylococcus aureus infection as the cause of diseases classified elsewhere: Secondary | ICD-10-CM

## 2018-02-12 DIAGNOSIS — I10 Essential (primary) hypertension: Secondary | ICD-10-CM

## 2018-02-12 DIAGNOSIS — Z992 Dependence on renal dialysis: Secondary | ICD-10-CM

## 2018-02-12 DIAGNOSIS — T827XXA Infection and inflammatory reaction due to other cardiac and vascular devices, implants and grafts, initial encounter: Secondary | ICD-10-CM

## 2018-02-12 DIAGNOSIS — R7881 Bacteremia: Secondary | ICD-10-CM

## 2018-02-12 DIAGNOSIS — I770 Arteriovenous fistula, acquired: Secondary | ICD-10-CM

## 2018-02-12 DIAGNOSIS — M25551 Pain in right hip: Secondary | ICD-10-CM

## 2018-02-12 LAB — URINE CULTURE: CULTURE: NO GROWTH

## 2018-02-12 LAB — GLUCOSE, CAPILLARY
GLUCOSE-CAPILLARY: 122 mg/dL — AB (ref 70–99)
GLUCOSE-CAPILLARY: 199 mg/dL — AB (ref 70–99)
GLUCOSE-CAPILLARY: 270 mg/dL — AB (ref 70–99)

## 2018-02-12 LAB — RENAL FUNCTION PANEL
ALBUMIN: 2.5 g/dL — AB (ref 3.5–5.0)
Anion gap: 16 — ABNORMAL HIGH (ref 5–15)
BUN: 64 mg/dL — ABNORMAL HIGH (ref 8–23)
CALCIUM: 9.3 mg/dL (ref 8.9–10.3)
CO2: 27 mmol/L (ref 22–32)
Chloride: 95 mmol/L — ABNORMAL LOW (ref 98–111)
Creatinine, Ser: 8.75 mg/dL — ABNORMAL HIGH (ref 0.44–1.00)
GFR, EST AFRICAN AMERICAN: 5 mL/min — AB (ref >60)
GFR, EST NON AFRICAN AMERICAN: 4 mL/min — AB (ref >60)
Glucose, Bld: 104 mg/dL — ABNORMAL HIGH (ref 70–99)
PHOSPHORUS: 5.9 mg/dL — AB (ref 2.5–4.6)
Potassium: 5.1 mmol/L (ref 3.5–5.1)
Sodium: 138 mmol/L (ref 135–145)

## 2018-02-12 LAB — CBC
HCT: 35.6 % — ABNORMAL LOW (ref 36.0–46.0)
Hemoglobin: 11.3 g/dL — ABNORMAL LOW (ref 12.0–15.0)
MCH: 31.9 pg (ref 26.0–34.0)
MCHC: 31.7 g/dL (ref 30.0–36.0)
MCV: 100.6 fL — ABNORMAL HIGH (ref 78.0–100.0)
PLATELETS: 120 10*3/uL — AB (ref 150–400)
RBC: 3.54 MIL/uL — AB (ref 3.87–5.11)
RDW: 14.6 % (ref 11.5–15.5)
WBC: 15.6 10*3/uL — AB (ref 4.0–10.5)

## 2018-02-12 MED ORDER — SODIUM CHLORIDE 0.9 % IV SOLN: 100.0000 mL | INTRAVENOUS | Status: DC | PRN

## 2018-02-12 MED ORDER — HEPARIN SODIUM (PORCINE) 1000 UNIT/ML DIALYSIS
2000.0000 [IU] | Freq: Once | INTRAMUSCULAR | Status: AC
  Administered 2018-02-12: 2000 [IU] via INTRAVENOUS_CENTRAL

## 2018-02-12 MED ORDER — CEFAZOLIN SODIUM-DEXTROSE 2-4 GM/100ML-% IV SOLN
2.0000 g | INTRAVENOUS | Status: DC
  Administered 2018-02-14 – 2018-02-16 (×2): 2 g via INTRAVENOUS
  Filled 2018-02-12 (×4): qty 100

## 2018-02-12 MED ORDER — PENTAFLUOROPROP-TETRAFLUOROETH EX AERO: 1.0000 "application " | INHALATION_SPRAY | CUTANEOUS | Status: DC | PRN

## 2018-02-12 MED ORDER — LIDOCAINE HCL (PF) 1 % IJ SOLN: 5.0000 mL | INTRAMUSCULAR | Status: DC | PRN

## 2018-02-12 MED ORDER — CEFAZOLIN SODIUM-DEXTROSE 2-4 GM/100ML-% IV SOLN
2.0000 g | Freq: Once | INTRAVENOUS | Status: AC
  Administered 2018-02-12: 2 g via INTRAVENOUS
  Filled 2018-02-12: qty 100

## 2018-02-12 MED ORDER — LIDOCAINE-PRILOCAINE 2.5-2.5 % EX CREA: 1.0000 "application " | TOPICAL_CREAM | CUTANEOUS | Status: DC | PRN

## 2018-02-12 MED FILL — !LANTUS 100 UNITS/ML VIAL: 100 | 28 days supply | Qty: 10 | Fill #1

## 2018-02-12 NOTE — Plan of Care (Signed)

## 2018-02-12 NOTE — Consult Note (Signed)
[facility] for Infectious Disease    Date of Admission:  02/10/2018   Total days of antibiotics         Day 1 Cefazolin                Reason for Consult: MSSA Bacteremia     Referring Provider: CHAMP  Primary Care Provider: [name], MD   Assessment: 67 y.o. female with ESRD (last 1.85yrs) secondary to DM via AVF MSSA bacteremia    Plan: 1. Would continue ancef 2. Repeat BCx in Am 3. Check TTE 4. Check u/s of her LUE fistula 5. appreciate renal f/u  Principal Problem:   Hyperglycemia Active Problems:   Essential hypertension   Abdominal pain   ESRD (end stage renal disease) (HCC)   Nausea & vomiting   . atorvastatin  40 mg Oral Daily  . Chlorhexidine Gluconate Cloth  6 each Topical Q0600  . DULoxetine  60 mg Oral Daily  . gabapentin  300 mg Oral QHS  . heparin  5,000 Units Subcutaneous Q8H  . insulin aspart  0-15 Units Subcutaneous TID WC  . insulin aspart  0-5 Units Subcutaneous QHS  . insulin glargine  15 Units Subcutaneous BID  . irbesartan  150 mg Oral Daily  . loratadine  10 mg Oral Daily  . multivitamin  1 tablet Oral QHS  . sevelamer carbonate  800 mg Oral TID WC    HPI: Sarah Kelley is a 67 y.o. female admitted with weakness and vomiting on Sunday. The patient is confused and cannot contribute to history. Obtained via chart review and discussion with her family (granddaughter and grandson). She was in her usual state of health until Sunday afternoon when she was noticeably weaker requiring assistance x 2 to the rest room. She began vomiting later that evening and became increasingly more confused so her family brought her to the ER for evaluation. She was febrile in the ED to 101.4 F and found to have elevated WBC count to 13.1K. Blood cultures and urine cultures were collected and started on ceftriaxone 1gm with presumptive urinary source. Blood cultures grew with GPCs in clusters in 1/4 bottles, MSSA on BCID.   Lelah undergoes  HD via long standing AVF in LUE. No trouble with access lately and does not have any pain, erythema or drainage from this site. Her granddaughter reports a fall about 1 month ago - she has right hip/lower quadrant pain.   Review of Systems  Unable to perform ROS: Mental status change    Past Medical History:  Diagnosis Date  . Diabetes mellitus   . ESRD (end stage renal disease) ([place])   . Hyperlipidemia   . Hypertension   . Neuropathy    Diabetic  . Pneumonia 07/2016  . Renal disorder   . Thyroid disease     Social History   Tobacco Use  . Smoking status: Never Smoker  . Smokeless tobacco: Never Used  Substance Use Topics  . Alcohol use: No    Alcohol/week: 0.0 standard drinks    Comment: rare- a beer every now and then  . Drug use: No    History reviewed. No pertinent family history. Allergies  Allergen Reactions  . No Known Allergies     OBJECTIVE: Blood pressure (!) 152/61, pulse 94, temperature 97.8 F (36.6 C), temperature source Oral, resp. rate 18, height 5\' 2"  (1.575 m), weight 67.9 kg, SpO2 98 %.  Physical Exam  Constitutional: She  appears well-developed and well-nourished.  HENT:  Mouth/Throat: No oropharyngeal exudate.  Eyes: Pupils are equal, round, and reactive to light. EOM are normal.  Neck: Normal range of motion. Neck supple.  Cardiovascular: Normal rate, regular rhythm and normal heart sounds.  Pulmonary/Chest: Effort normal and breath sounds normal.  Abdominal: Soft. Bowel sounds are normal. She exhibits no distension. There is no tenderness.  Musculoskeletal: She exhibits no edema.  Lymphadenopathy:    She has no cervical adenopathy.  Skin:       Lab Results Lab Results  Component Value Date   WBC 15.6 (H) 02/12/2018   HGB 11.3 (L) 02/12/2018   HCT 35.6 (L) 02/12/2018   MCV 100.6 (H) 02/12/2018   PLT 120 (L) 02/12/2018    Lab Results  Component Value Date   CREATININE 8.75 (H) 02/12/2018   BUN 64 (H) 02/12/2018   NA 138  02/12/2018   K 5.1 02/12/2018   CL 95 (L) 02/12/2018   CO2 27 02/12/2018    Lab Results  Component Value Date   ALT 17 02/11/2018   AST 24 02/11/2018   ALKPHOS 106 02/11/2018   BILITOT 0.7 02/11/2018     Microbiology: BCx 8/11 >> 1/4 bottles MSSA  Urine Cx 8/11 >> no growth  Sarah Madeira, MSN, NP-C [facility] [place] Cell: [phone] Pager: [phone]  02/12/2018 3:15 PM

## 2018-02-12 NOTE — Progress Notes (Signed)
[facility] Progress Note  Subjective: pt still confused but overall communicating better and more awake today.  Had HD this am. 2.8 L removed.    Vitals:   02/12/18 1130 02/12/18 1135 02/12/18 1200 02/12/18 1245  BP: (!) 86/47 (!) 96/50 107/75 (!) 152/61  Pulse: 86  89 94  Resp: _0 Temp:    97.8 F (36.6 C)  TempSrc:    Oral  SpO2:    98%  Weight:    67.9 kg  Height:        Inpatient medications: . atorvastatin  40 mg Oral Daily  . Chlorhexidine Gluconate Cloth  6 each Topical Q0600  . DULoxetine  60 mg Oral Daily  . gabapentin  300 mg Oral QHS  . heparin  5,000 Units Subcutaneous Q8H  . insulin aspart  0-15 Units Subcutaneous TID WC  . insulin aspart  0-5 Units Subcutaneous QHS  . insulin glargine  15 Units Subcutaneous BID  . irbesartan  150 mg Oral Daily  . loratadine  10 mg Oral Daily  . multivitamin  1 tablet Oral QHS  . sevelamer carbonate  800 mg Oral TID WC   .  ceFAZolin (ANCEF) IV    . [START ON 02/14/2018]  ceFAZolin (ANCEF) IV     acetaminophen **OR** acetaminophen, ondansetron (ZOFRAN) IV  Exam:  older adult female, no distress, tired but responsive  +JVD   chest cta bilat no rales  cor reg no mrg  abd soft obese ntnd no ascites  ext trace LE edema   lua AVF+bruit  confused, nonfocal, alert   HPI Summary:  Sarah Kelley  is a 67 y.o. female, w hypertension, hyperlipidemia, Dm2, ESRD on HD (T, T, S), apparently c/o LLQ pain starting yesterday along with n/v but no bloody emesis. Pt family states that she had a waffle yesterday and then her sugar became unreadably high.  Pt denies fever, chills, cough, cp, palp, sob, diarrhea, brbpr, black stool, dysuria, hematuria.  In ED CXR showed  IMPRESSION: Left basilar atelectasis. Na 130, K 5.3, Bun 49, Creatinine 6.45 Ast 24, Alt 18 Alk phos 137, T. Bili 0.8 Lipase 49 Glucose 749 Wbc 13.1, Hgb 12.6, Plt 125 PH 7.344, pCo2 59.7, pO2 26 Trop 0.37 LA 2.43 Urinalysis wbc >50, Rbc  11-20 Pt will be admitted for n/v, abd pain LLQ, uti, and hyperglycemia. And troponin elevation    home meds:  - lantus 15 u bid  - valsartan 160 qd  - atorvastatin 40 qd  - duloxetine 60 qd/ neurontin 600 mg hs/ percocet 5-325 prn   Impression/ Plan:  Principal Problem:   Hyperglycemia Active Problems:   Essential hypertension   Abdominal pain   ESRD (end stage renal disease) (HCC)   Nausea & vomiting  MSSA bacteremia: in esrd pt , no indwelling lines.  Per renal and ID service.  - changed to Ancef IV, check TTE and [place] of dialysis access per ID - fevers improving, pt looks better today - wbc down today - urine cx was negative - CT scan abd/ pelvis negative except for bladder wall thickening  AMS: remains confused but more awake and alert, improving - cont observation - is swallowing pills w/o difficulty  Uncontrolled DM2: resolved, sp insulin drip - resumed home dose lantus 15 u bid on 8/12 and added SSI ac and hs - BS's are better  ESRD: on HD TTS - seen by renal service - HD today w/o incident, 2.8 L removed  Hypertension - cont home Diovan 162m po qday, hold if BP < 125  Diabetic neuropathy - Cont cymbalta 636mpo qday - Cont Neurontin lower to 300 mg po qhs  Hyperlipidmeia - Cont Lipitor 4070mo qhs   RobKelly Splinter [facility] pgr ([phone]/2019, 4:06 PM   DVT Prophylaxis: Heparin  - SCDs  Family Communication: multiple family members at bedside  Code Status:  FULL CODE  Condition: Fair    Consults called:  renal   Admission status: inpatient    Recent Labs  Lab 02/11/18 0940 02/11/18 1418 02/12/18 0418  NA 138 136 138  K 5.1 5.4* 5.1  CL 99 98 95*  CO2 21* 23 27  GLUCOSE 162* 116* 104*  BUN 56* 58* 64*  CREATININE 7.33* 7.62* 8.75*  CALCIUM 9.3 9.0 9.3  PHOS  --   --  5.9*   Recent Labs  Lab 02/10/18 2106 02/11/18 0940 02/12/18 0418  AST 24 24  --   ALT 18 17  --   ALKPHOS 137*  106  --   BILITOT 0.8 0.7  --   PROT 6.9 6.5  --   ALBUMIN 3.3* 2.8* 2.5*   Recent Labs  Lab 02/10/18 2106 02/11/18 0733 02/12/18 0418  WBC 13.1* 18.0* 15.6*  HGB 12.6 11.4* 11.3*  HCT 39.3 34.1* 35.6*  MCV 100.8* 95.5 100.6*  PLT 125* 117* 120*   Iron/TIBC/Ferritin/ %Sat    Component Value Date/Time   IRON 20 (L) 08/01/2016 0843   TIBC 167 (L) 08/01/2016 0843   FERRITIN 245 08/01/2016 0843   IRONPCTSAT 12 08/01/2016 [id number]

## 2018-02-12 NOTE — Procedures (Signed)
Tol HD No hemodialysis instability or access issue. K 5.1,Hgb 11.3gm  Shellsea Borunda C. Florene Glen, MD

## 2018-02-13 ENCOUNTER — Inpatient Hospital Stay (HOSPITAL_COMMUNITY): Payer: Medicaid Other

## 2018-02-13 DIAGNOSIS — K08409 Partial loss of teeth, unspecified cause, unspecified class: Secondary | ICD-10-CM

## 2018-02-13 DIAGNOSIS — R410 Disorientation, unspecified: Secondary | ICD-10-CM

## 2018-02-13 DIAGNOSIS — I351 Nonrheumatic aortic (valve) insufficiency: Secondary | ICD-10-CM

## 2018-02-13 DIAGNOSIS — I4949 Other premature depolarization: Secondary | ICD-10-CM

## 2018-02-13 DIAGNOSIS — N186 End stage renal disease: Secondary | ICD-10-CM

## 2018-02-13 LAB — CBC
HEMATOCRIT: 34.6 % — AB (ref 36.0–46.0)
HEMOGLOBIN: 10.8 g/dL — AB (ref 12.0–15.0)
MCH: 31.6 pg (ref 26.0–34.0)
MCHC: 31.2 g/dL (ref 30.0–36.0)
MCV: 101.2 fL — AB (ref 78.0–100.0)
Platelets: 134 10*3/uL — ABNORMAL LOW (ref 150–400)
RBC: 3.42 MIL/uL — AB (ref 3.87–5.11)
RDW: 14.6 % (ref 11.5–15.5)
WBC: 10.1 10*3/uL (ref 4.0–10.5)

## 2018-02-13 LAB — BASIC METABOLIC PANEL
Anion gap: 16 — ABNORMAL HIGH (ref 5–15)
BUN: 41 mg/dL — ABNORMAL HIGH (ref 8–23)
CO2: 25 mmol/L (ref 22–32)
Calcium: 8.6 mg/dL — ABNORMAL LOW (ref 8.9–10.3)
Chloride: 92 mmol/L — ABNORMAL LOW (ref 98–111)
Creatinine, Ser: 5.7 mg/dL — ABNORMAL HIGH (ref 0.44–1.00)
GFR calc Af Amer: 8 mL/min — ABNORMAL LOW (ref >60)
GFR calc non Af Amer: 7 mL/min — ABNORMAL LOW (ref >60)
Glucose, Bld: 269 mg/dL — ABNORMAL HIGH (ref 70–99)
POTASSIUM: 4.2 mmol/L (ref 3.5–5.1)
Sodium: 133 mmol/L — ABNORMAL LOW (ref 135–145)

## 2018-02-13 LAB — ECHOCARDIOGRAM COMPLETE
Height: 62 in
Weight: 2366.86 oz

## 2018-02-13 LAB — GLUCOSE, CAPILLARY
Glucose-Capillary: 180 mg/dL — ABNORMAL HIGH (ref 70–99)
Glucose-Capillary: 191 mg/dL — ABNORMAL HIGH (ref 70–99)
Glucose-Capillary: 193 mg/dL — ABNORMAL HIGH (ref 70–99)
Glucose-Capillary: 133 mg/dL — ABNORMAL HIGH (ref 70–99)

## 2018-02-13 MED ORDER — PRO-STAT SUGAR FREE PO LIQD
30.0000 mL | Freq: Two times a day (BID) | ORAL | Status: DC
  Administered 2018-02-13 – 2018-02-17 (×7): 30 mL via ORAL
  Filled 2018-02-13 (×8): qty 30

## 2018-02-13 NOTE — Progress Notes (Addendum)
[facility] Progress Note   Subjective: Awake, alert, looks much better. Sitter at bedside, mental status seems back to baseline.  No new complaints.   Objective Vitals:   02/12/18 2242 02/13/18 0033 02/13/18 0416 02/13/18 0950  BP: (!) 118/46 (!) 116/31 (!) 106/42 (!) 142/60  Pulse: 96     Resp: 17     Temp:  98.6 F (37 C) 98.3 F (36.8 C)   TempSrc:  Oral Oral   SpO2: 96%     Weight:   67.1 kg   Height:       Physical Exam General: Pleasant, cooperative, NAD Heart: S5,K5 2/6 systolic M.  Lungs: CTAB Abdomen: Active BS Extremities: No LE edema Dialysis Access: LUA AVF + bruit  Additional Objective Labs: Basic Metabolic Panel: Recent Labs  Lab 02/11/18 1418 02/12/18 0418 02/13/18 0402  NA 136 138 133*  K 5.4* 5.1 4.2  CL 98 95* 92*  CO2 23 27 25   GLUCOSE 116* 104* 269*  BUN 58* 64* 41*  CREATININE 7.62* 8.75* 5.70*  CALCIUM 9.0 9.3 8.6*  PHOS  --  5.9*  --    Liver Function Tests: Recent Labs  Lab 02/10/18 2106 02/11/18 0940 02/12/18 0418  AST 24 24  --   ALT 18 17  --   ALKPHOS 137* 106  --   BILITOT 0.8 0.7  --   PROT 6.9 6.5  --   ALBUMIN 3.3* 2.8* 2.5*   Recent Labs  Lab 02/10/18 2106  LIPASE 49   CBC: Recent Labs  Lab 02/10/18 2106 02/11/18 0733 02/12/18 0418 02/13/18 0402  WBC 13.1* 18.0* 15.6* 10.1  HGB 12.6 11.4* 11.3* 10.8*  HCT 39.3 34.1* 35.6* 34.6*  MCV 100.8* 95.5 100.6* 101.2*  PLT 125* 117* 120* 134*   Blood Culture    Component Value Date/Time   SDES URINE, CATHETERIZED 02/10/2018 [place] NONE 02/10/2018 2342   CULT  02/10/2018 2342    NO GROWTH Performed at [facility], [place] [address], [place], [place] [address]    REPTSTATUS 02/12/2018 FINAL 02/10/2018 2342    Cardiac Enzymes: Recent Labs  Lab 02/11/18 0228 02/11/18 0940 02/11/18 1418  TROPONINI 0.51* 0.69* 0.56*   CBG: Recent Labs  Lab 02/11/18 2156 02/12/18 1347 02/12/18 1707 02/12/18 2251 02/13/18 0758   GLUCAP 132* 122* 199* 270* 180*   Iron Studies: No results for input(s): IRON, TIBC, TRANSFERRIN, FERRITIN in the last 72 hours. @lablastinr3 @ Studies/Results: No results found. Medications: . [START ON 02/14/2018]  ceFAZolin (ANCEF) IV     . atorvastatin  40 mg Oral Daily  . Chlorhexidine Gluconate Cloth  6 each Topical Q0600  . DULoxetine  60 mg Oral Daily  . gabapentin  300 mg Oral QHS  . heparin  5,000 Units Subcutaneous Q8H  . insulin aspart  0-15 Units Subcutaneous TID WC  . insulin aspart  0-5 Units Subcutaneous QHS  . insulin glargine  15 Units Subcutaneous BID  . irbesartan  150 mg Oral Daily  . loratadine  10 mg Oral Daily  . multivitamin  1 tablet Oral QHS  . sevelamer carbonate  800 mg Oral TID WC     Dialysis Orders: [place] T,Th,S 4 hrs 180 NRe 400/800 66.5 kg 2.0L/2.25 Ca UFP 2 LUA AVF -Heparin 2000 units IV TIW -Hectorol 2 mcg IV TIW  Assessment/Plan: 1.  MSSA bacteremia-ID consulted, ABX changed to Ancef. No clear source of infection.  2. DKA-per primary. Resolved/Transitioned off insulin gtt. Per primary  3. AMS-appears at baseline. Much improved.  4.  ESRD -  T,Th,S via AVF. K+ 4.2. HD tomorrow on schedule.  5.  Hypertension/volume  - HD yesterday on schedule. Pre wt 69.7 NET UF 2877 post wt 67.9 kg. Close to OP EDW. 1-1.5 liters in HD tomorrow.  BP well controlled. Continue Valsartan 160 mg PO daily (has been substituted for irbesartan 150 mg).  6.  Anemia  - HGB 10.8 No ESA needed.  7.  Metabolic bone disease - Ca 8.6. Continue binders, VDRA.  8.  Nutrition -Albumin 2.5. Has been advanced to soft diet.  prostat and renal vit. 9. DM-as noted above-per primary 10. H/O dementia/depression-per primary  Sarah H. Brown NP-C 02/13/2018, 9:53 AM  [facility] [phone]  Renal Attending: As above, echo in progress.  We will cont to support as detailed above. Estanislado Emms MD

## 2018-02-13 NOTE — Progress Notes (Signed)
  Echocardiogram 2D Echocardiogram has been performed.  Jannett Celestine 02/13/2018, 1:01 PM

## 2018-02-13 NOTE — Progress Notes (Signed)
Duplex dialysis access performed. 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   Landry Mellow, RDMS, RVT.

## 2018-02-13 NOTE — Progress Notes (Signed)
PROGRESS NOTE                                                                                                                                                                                                             Patient Demographics:    Sarah Kelley, is a 67 y.o. female, DOB - 07/23/1950, ZDG:[id number]  Admit date - 02/10/2018   Admitting Physician Jani Gravel, MD  Outpatient Primary MD for the patient is [name], MD  LOS - 2   Chief Complaint  Patient presents with  . Altered Mental Status  . Hypoglycemia       Brief Narrative   67 y.o.female,w hypertension, hyperlipidemia, Dm2, ESRD on HD (T, T, S), presents with weakness and vomiting last Sunday, as well she was confused, she was noted to have fever 101.4, leukocytosis of 13 point 1K, he receives hemodialysis via long-standing aVF and left upper extremity, her work-up significant for MSSA bacteremia.   Subjective:    [name] today has, No headache, No chest pain, No abdominal pain - No Nausea,   Principal Problem:   Hyperglycemia Active Problems:   Essential hypertension   Abdominal pain   ESRD (end stage renal disease) (HCC)   Altered mental status   Nausea & vomiting   AVF (arteriovenous fistula) (HCC)   MSSA (methicillin susceptible Staphylococcus aureus) infection  MSSA bacteremia -End-stage renal disease patient, on hemodialysis, she is receiving dialysis via left upper extremity aVF, there is no indwelling lines. -ID input greatly appreciated, follow on repeat blood cultures, no further fever, leukocytosis has resolved, continue with IV cefazolin with hemodialysis, ultrasound of aVF showing 75% stenosis, 2D echo with no significant valvular disease. - CT scan abd/ pelvis negative except for bladder wall thickening  Encephalopathy -In the setting of infectious process, improving  Uncontrolled DM2: resolved, sp insulin drip - resumed  home dose lantus 15 u bid on 8/12 and added SSI ac and hs - BS's are better  ESRD: on HD TTS - seen by renal service  Hypertension - cont home Diovan 160mg  po qday, hold if BP < 125  Diabetic neuropathy - Cont cymbalta 60mg  po qday - Cont Neurontin lower to 300 mg po qhs  Hyperlipidmeia - Cont Lipitor 40mg  po qhs     Code Status : Full  Family Communication  : Discussed with  daughter at bedside via interpreter  Disposition Plan  : Home when stable  Consults  :  ID, renal  Procedures  : none  DVT Prophylaxis  :  [place] heparin  Lab Results  Component Value Date   PLT 134 (L) 02/13/2018    Antibiotics  :    Anti-infectives (From admission, onward)   Start     Dose/Rate Route Frequency Ordered Stop   02/14/18 1200  ceFAZolin (ANCEF) IVPB 2g/100 mL premix     2 g 200 mL/hr over 30 Minutes Intravenous Every T-Th-Sa (Hemodialysis) 02/12/18 1504     02/12/18 1700  ceFAZolin (ANCEF) IVPB 2g/100 mL premix     2 g 200 mL/hr over 30 Minutes Intravenous  Once 02/12/18 1504 02/12/18 1836   02/12/18 0000  cefTRIAXone (ROCEPHIN) 1 g in sodium chloride 0.9 % 100 mL IVPB  Status:  Discontinued     1 g 200 mL/hr over 30 Minutes Intravenous Every 24 hours 02/11/18 0144 02/11/18 1749   02/12/18 0000  cefTRIAXone (ROCEPHIN) 2 g in sodium chloride 0.9 % 100 mL IVPB  Status:  Discontinued     2 g 200 mL/hr over 30 Minutes Intravenous Every 24 hours 02/11/18 1749 02/12/18 1504   02/11/18 0030  cefTRIAXone (ROCEPHIN) 1 g in sodium chloride 0.9 % 100 mL IVPB     1 g 200 mL/hr over 30 Minutes Intravenous  Once 02/11/18 0026 02/11/18 0150        Objective:   Vitals:   02/13/18 0033 02/13/18 0416 02/13/18 0950 02/13/18 1640  BP: (!) 116/31 (!) 106/42 (!) 142/60 (!) 116/54  Pulse:    86  Resp:    17  Temp: 98.6 F (37 C) 98.3 F (36.8 C)  97.7 F (36.5 C)  TempSrc: Oral Oral  Oral  SpO2:    98%  Weight:  67.1 kg    Height:        Wt Readings from Last 3 Encounters:    02/13/18 67.1 kg  12/31/17 68 kg  11/14/17 72.6 kg     Intake/Output Summary (Last 24 hours) at 02/13/2018 1647 Last data filed at 02/13/2018 1436 Gross per 24 hour  Intake 447.55 ml  Output 200 ml  Net 247.55 ml     Physical Exam  Awake Alert, laying in bed in no apparent distress  Symmetrical Chest wall movement, Good air movement bilaterally, CTAB RRR,No Gallops,Rubs or new Murmurs, No Parasternal Heave +ve B.Sounds, Abd Soft, No tenderness, No rebound - guarding or rigidity. No Cyanosis, Clubbing or edema, No new Rash or bruise      Data Review:    CBC Recent Labs  Lab 02/10/18 2106 02/11/18 0733 02/12/18 0418 02/13/18 0402  WBC 13.1* 18.0* 15.6* 10.1  HGB 12.6 11.4* 11.3* 10.8*  HCT 39.3 34.1* 35.6* 34.6*  PLT 125* 117* 120* 134*  MCV 100.8* 95.5 100.6* 101.2*  MCH 32.3 31.9 31.9 31.6  MCHC 32.1 33.4 31.7 31.2  RDW 13.8 13.9 14.6 14.6    Chemistries  Recent Labs  Lab 02/10/18 2106 02/11/18 0228 02/11/18 0940 02/11/18 1418 02/12/18 0418 02/13/18 0402  NA 130*  --  138 136 138 133*  K 5.3*  --  5.1 5.4* 5.1 4.2  CL 88*  --  99 98 95* 92*  CO2 23  --  21* 23 27 25   GLUCOSE 749*  --  162* 116* 104* 269*  BUN 49*  --  56* 58* 64* 41*  CREATININE 6.45* 6.79* 7.33* 7.62* 8.75*  5.70*  CALCIUM 9.3  --  9.3 9.0 9.3 8.6*  AST 24  --  24  --   --   --   ALT 18  --  17  --   --   --   ALKPHOS 137*  --  106  --   --   --   BILITOT 0.8  --  0.7  --   --   --    ------------------------------------------------------------------------------------------------------------------ No results for input(s): CHOL, HDL, LDLCALC, TRIG, CHOLHDL, LDLDIRECT in the last 72 hours.  Lab Results  Component Value Date   HGBA1C 8.2 07/25/2017   ------------------------------------------------------------------------------------------------------------------ No results for input(s): TSH, T4TOTAL, T3FREE, THYROIDAB in the last 72 hours.  Invalid input(s):  FREET3 ------------------------------------------------------------------------------------------------------------------ No results for input(s): VITAMINB12, FOLATE, FERRITIN, TIBC, IRON, RETICCTPCT in the last 72 hours.  Coagulation profile No results for input(s): INR, PROTIME in the last 168 hours.  No results for input(s): DDIMER in the last 72 hours.  Cardiac Enzymes Recent Labs  Lab 02/11/18 0228 02/11/18 0940 02/11/18 1418  TROPONINI 0.51* 0.69* 0.56*   ------------------------------------------------------------------------------------------------------------------ No results found for: BNP  Inpatient Medications  Scheduled Meds: . atorvastatin  40 mg Oral Daily  . Chlorhexidine Gluconate Cloth  6 each Topical Q0600  . DULoxetine  60 mg Oral Daily  . feeding supplement (PRO-STAT SUGAR FREE 64)  30 mL Oral BID  . gabapentin  300 mg Oral QHS  . heparin  5,000 Units Subcutaneous Q8H  . insulin aspart  0-15 Units Subcutaneous TID WC  . insulin aspart  0-5 Units Subcutaneous QHS  . insulin glargine  15 Units Subcutaneous BID  . irbesartan  150 mg Oral Daily  . loratadine  10 mg Oral Daily  . multivitamin  1 tablet Oral QHS  . sevelamer carbonate  800 mg Oral TID WC   Continuous Infusions: . [START ON 02/14/2018]  ceFAZolin (ANCEF) IV     PRN Meds:.acetaminophen **OR** acetaminophen, ondansetron (ZOFRAN) IV  Micro Results Recent Results (from the past 240 hour(s))  Blood culture (routine x 2)     Status: None (Preliminary result)   Collection Time: 02/10/18 10:42 PM  Result Value Ref Range Status   Specimen Description BLOOD RIGHT UPPER ARM  Final   Special Requests   Final    BOTTLES DRAWN AEROBIC ONLY Blood Culture results may not be optimal due to an inadequate volume of blood received in culture bottles   Culture   Final    NO GROWTH 2 DAYS Performed at [facility], [place] [address], [place], [place] [address]    Report Status PENDING  Incomplete   Blood culture (routine x 2)     Status: Abnormal (Preliminary result)   Collection Time: 02/10/18 11:06 PM  Result Value Ref Range Status   Specimen Description BLOOD RIGHT ANTECUBITAL  Final   Special Requests   Final    BOTTLES DRAWN AEROBIC AND ANAEROBIC Blood Culture adequate volume   Culture  Setup Time   Final    GRAM POSITIVE COCCI IN CLUSTERS AEROBIC BOTTLE ONLY CRITICAL RESULT CALLED TO, READ BACK BY AND VERIFIED WITH: B MANCHERIL PHARMD 02/11/18 1705 JDW    Culture (A)  Final    STAPHYLOCOCCUS AUREUS SUSCEPTIBILITIES TO FOLLOW Performed at [facility], [place] [address], [place], [place] [address]    Report Status PENDING  Incomplete  Blood Culture ID Panel (Reflexed)     Status: Abnormal   Collection Time: 02/10/18 11:06 PM  Result  Value Ref Range Status   Enterococcus species NOT DETECTED NOT DETECTED Final   Listeria monocytogenes NOT DETECTED NOT DETECTED Final   Staphylococcus species DETECTED (A) NOT DETECTED Final    Comment: CRITICAL RESULT CALLED TO, READ BACK BY AND VERIFIED WITH: B MANCHERIL PHARMD 02/11/18 JDW    Staphylococcus aureus DETECTED (A) NOT DETECTED Final    Comment: Methicillin (oxacillin) susceptible Staphylococcus aureus (MSSA). Preferred therapy is anti staphylococcal beta lactam antibiotic (Cefazolin or Nafcillin), unless clinically contraindicated. CRITICAL RESULT CALLED TO, READ BACK BY AND VERIFIED WITH: B MANCHERIL PHARMD 02/11/18 1705 JDW    Methicillin resistance NOT DETECTED NOT DETECTED Final   Streptococcus species NOT DETECTED NOT DETECTED Final   Streptococcus agalactiae NOT DETECTED NOT DETECTED Final   Streptococcus pneumoniae NOT DETECTED NOT DETECTED Final   Streptococcus pyogenes NOT DETECTED NOT DETECTED Final   Acinetobacter baumannii NOT DETECTED NOT DETECTED Final   Enterobacteriaceae species NOT DETECTED NOT DETECTED Final   Enterobacter cloacae complex NOT DETECTED NOT DETECTED Final   Escherichia coli NOT DETECTED  NOT DETECTED Final   Klebsiella oxytoca NOT DETECTED NOT DETECTED Final   Klebsiella pneumoniae NOT DETECTED NOT DETECTED Final   Proteus species NOT DETECTED NOT DETECTED Final   Serratia marcescens NOT DETECTED NOT DETECTED Final   Haemophilus influenzae NOT DETECTED NOT DETECTED Final   Neisseria meningitidis NOT DETECTED NOT DETECTED Final   Pseudomonas aeruginosa NOT DETECTED NOT DETECTED Final   Candida albicans NOT DETECTED NOT DETECTED Final   Candida glabrata NOT DETECTED NOT DETECTED Final   Candida krusei NOT DETECTED NOT DETECTED Final   Candida parapsilosis NOT DETECTED NOT DETECTED Final   Candida tropicalis NOT DETECTED NOT DETECTED Final    Comment: Performed at [facility], [place] [address] [place], [place] [address]  Urine culture     Status: None   Collection Time: 02/10/18 11:42 PM  Result Value Ref Range Status   Specimen Description URINE, CATHETERIZED  Final   Special Requests NONE  Final   Culture   Final    NO GROWTH Performed at [facility], [address], [place], [place] [address]    Report Status 02/12/2018 FINAL  Final  MRSA PCR Screening     Status: None   Collection Time: 02/11/18  2:43 AM  Result Value Ref Range Status   MRSA by PCR NEGATIVE NEGATIVE Final    Comment:        The GeneXpert MRSA Assay (FDA approved for NASAL specimens only), is one component of a comprehensive MRSA colonization surveillance program. It is not intended to diagnose MRSA infection nor to guide or monitor treatment for MRSA infections. Performed at [facility], [place] [address] [place],  [address]     Radiology Reports Ct Abdomen Pelvis Wo Contrast  Result Date: 02/11/2018 CLINICAL DATA:  Abdominal pain. Vomiting. Difficulty with diabetes. EXAM: CT ABDOMEN AND PELVIS WITHOUT CONTRAST TECHNIQUE: Multidetector CT imaging of the abdomen and pelvis was performed following the standard protocol without IV contrast. COMPARISON:  07/28/2016  FINDINGS: Lower chest: Small bilateral pleural effusions with basilar atelectasis. Improved since previous study. Small esophageal hiatal hernia. Coronary artery calcifications. Hepatobiliary: No focal liver abnormality is seen. No gallstones, gallbladder wall thickening, or biliary dilatation. Pancreas: Unremarkable. No pancreatic ductal dilatation or surrounding inflammatory changes. Spleen: Normal in size without focal abnormality. Adrenals/Urinary Tract: No adrenal gland nodules. Bilateral renal atrophy. No hydronephrosis or hydroureter. No renal, ureteral, or bladder stones. Bladder wall is  diffusely thickened with small amount of gas in the bladder. This could represent cystitis. Stomach/Bowel: Stomach, small bowel, and colon are not abnormally distended. Stool fills the colon. Small bowel are decompressed. No wall thickening is identified although under distention limits evaluation of bowel wall. Appendix is not identified. Vascular/Lymphatic: Aortic atherosclerosis. No enlarged abdominal or pelvic lymph nodes. Reproductive: Uterus and bilateral adnexa are unremarkable. Other: There is a moderate-sized infraumbilical abdominal wall hernia at the midline containing fat. No free air or free fluid in the abdomen. Small right inguinal hernia containing fat. Musculoskeletal: No acute or significant osseous findings. IMPRESSION: 1. Small bilateral pleural effusions with basilar atelectasis. Small esophageal hiatal hernia. Bilateral renal atrophy. Bladder wall thickening with small amount of gas in the bladder. This could represent cystitis. 2. Moderate-sized infraumbilical abdominal wall hernia containing fat. Small right inguinal hernia containing fat. Electronically Signed   By: Lucienne Capers M.D.   On: 02/11/2018 04:48   Dg Chest Port 1 View  Result Date: 02/10/2018 CLINICAL DATA:  Vomiting EXAM: PORTABLE CHEST 1 VIEW COMPARISON:  11/14/2017 FINDINGS: Cardiac shadow is at the upper limits of normal in  size. The lungs are hypoinflated with mild left basilar atelectasis. No bony abnormality is seen. Stent is again noted in the left cephalic vein. IMPRESSION: Left basilar atelectasis. Electronically Signed   By: Inez Catalina M.D.   On: 02/10/2018 23:34   [place] Lt Upper Extrem Ltd Soft Tissue Non Vascular  Result Date: 02/13/2018 CLINICAL DATA:  Swelling about left upper extremity fistula. EXAM: ULTRASOUND LEFT UPPER EXTREMITY LIMITED TECHNIQUE: Ultrasound examination of the upper extremity soft tissues was performed in the area of clinical concern. COMPARISON:  Fistulogram 12/12/2017. 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 No fluid collection or mass about the fistula is seen. No acute abnormality is identified. IMPRESSION: Negative for fluid collection or mass.  No acute finding. 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 Electronically Signed   By: Inge Rise M.D.   On: 02/13/2018 10:16     Phillips Climes M.D on 02/13/2018 at 4:47 PM  Between 7am to 7pm - Pager - [phone]  After 7pm go to [email or web address] - password [facility]  [facility] -  Office  [phone]

## 2018-02-13 NOTE — Progress Notes (Signed)
[facility] for Infectious Disease  Date of Admission:  02/10/2018   Total days of antibiotics 4        Day 2 cefazolin           Patient ID: Sarah Kelley is a 67 y.o. Spanish speaking female with ESRD with  Principal Problem:   Hyperglycemia Active Problems:   Essential hypertension   Abdominal pain   ESRD (end stage renal disease) (HCC)   Altered mental status   Nausea & vomiting   AVF (arteriovenous fistula) (HCC)   MSSA (methicillin susceptible Staphylococcus aureus) infection   . atorvastatin  40 mg Oral Daily  . Chlorhexidine Gluconate Cloth  6 each Topical Q0600  . DULoxetine  60 mg Oral Daily  . feeding supplement (PRO-STAT SUGAR FREE 64)  30 mL Oral BID  . gabapentin  300 mg Oral QHS  . heparin  5,000 Units Subcutaneous Q8H  . insulin aspart  0-15 Units Subcutaneous TID WC  . insulin aspart  0-5 Units Subcutaneous QHS  . insulin glargine  15 Units Subcutaneous BID  . irbesartan  150 mg Oral Daily  . loratadine  10 mg Oral Daily  . multivitamin  1 tablet Oral QHS  . sevelamer carbonate  800 mg Oral TID WC    SUBJECTIVE: Not as confused per family today. Asking appropriate questions. Some chest discomfort/cough.    Allergies  Allergen Reactions  . No Known Allergies     OBJECTIVE: Vitals:   02/12/18 2242 02/13/18 0033 02/13/18 0416 02/13/18 0950  BP: (!) 118/46 (!) 116/31 (!) 106/42 (!) 142/60  Pulse: 96     Resp: 17     Temp:  98.6 F (37 C) 98.3 F (36.8 C)   TempSrc:  Oral Oral   SpO2: 96%     Weight:   67.1 kg   Height:       Body mass index is 27.06 kg/m.  Physical Exam  Constitutional: She is oriented to person, place, and time.  Resting comfortably in bed. Family member present.   HENT:  Mouth/Throat: No oropharyngeal exudate.  Multiple missing teeth.   Cardiovascular: Normal rate and regular rhythm. Frequent extrasystoles are present.  No murmur heard. Pulmonary/Chest: Effort normal and breath sounds normal.  No respiratory distress.  Abdominal: Soft. Bowel sounds are normal. She exhibits no distension.  Musculoskeletal: Normal range of motion.  LU AVF + bruit/thrill   Lymphadenopathy:    She has no cervical adenopathy.  Neurological: She is alert and oriented to person, place, and time.  Skin: Skin is warm and dry.  Psychiatric: She has a normal mood and affect. Thought content normal.   Lab Results Lab Results  Component Value Date   WBC 10.1 02/13/2018   HGB 10.8 (L) 02/13/2018   HCT 34.6 (L) 02/13/2018   MCV 101.2 (H) 02/13/2018   PLT 134 (L) 02/13/2018    Lab Results  Component Value Date   CREATININE 5.70 (H) 02/13/2018   BUN 41 (H) 02/13/2018   NA 133 (L) 02/13/2018   K 4.2 02/13/2018   CL 92 (L) 02/13/2018   CO2 25 02/13/2018    Lab Results  Component Value Date   ALT 17 02/11/2018   AST 24 02/11/2018   ALKPHOS 106 02/11/2018   BILITOT 0.7 02/11/2018     Microbiology: BCx 8/11 >> 1/4 bottles MSSA  BCx 8/13 >> pending  Urine Cx 8/11 >> no growth   Assessment & Plan:  1. MSSA Bacteremia = repeat cultures pending. No further fevers and WBC normalized. Continue cefazolin with HD. Ultrasound of AVF and TTE to be done today to help determine length of therapy.   2. Confusion = seems to be resolving now per my discussion and family's understanding.   Janene Madeira, MSN, NP-C [facility] [place] Cell: [phone] Pager: [phone]  02/13/2018  11:20 AM

## 2018-02-14 ENCOUNTER — Inpatient Hospital Stay (HOSPITAL_COMMUNITY): Payer: Medicaid Other

## 2018-02-14 DIAGNOSIS — E878 Other disorders of electrolyte and fluid balance, not elsewhere classified: Secondary | ICD-10-CM

## 2018-02-14 DIAGNOSIS — R7989 Other specified abnormal findings of blood chemistry: Secondary | ICD-10-CM

## 2018-02-14 DIAGNOSIS — E871 Hypo-osmolality and hyponatremia: Secondary | ICD-10-CM

## 2018-02-14 LAB — BASIC METABOLIC PANEL
ANION GAP: 17 — AB (ref 5–15)
BUN: 61 mg/dL — ABNORMAL HIGH (ref 8–23)
CALCIUM: 8.6 mg/dL — AB (ref 8.9–10.3)
CO2: 24 mmol/L (ref 22–32)
CREATININE: 7.45 mg/dL — AB (ref 0.44–1.00)
Chloride: 92 mmol/L — ABNORMAL LOW (ref 98–111)
GFR calc non Af Amer: 5 mL/min — ABNORMAL LOW (ref >60)
GFR, EST AFRICAN AMERICAN: 6 mL/min — AB (ref >60)
Glucose, Bld: 53 mg/dL — ABNORMAL LOW (ref 70–99)
Potassium: 3.8 mmol/L (ref 3.5–5.1)
SODIUM: 133 mmol/L — AB (ref 135–145)

## 2018-02-14 LAB — GLUCOSE, CAPILLARY
GLUCOSE-CAPILLARY: 39 mg/dL — AB (ref 70–99)
GLUCOSE-CAPILLARY: 42 mg/dL — AB (ref 70–99)
GLUCOSE-CAPILLARY: 123 mg/dL — AB (ref 70–99)
GLUCOSE-CAPILLARY: 63 mg/dL — AB (ref 70–99)
GLUCOSE-CAPILLARY: 132 mg/dL — AB (ref 70–99)
Glucose-Capillary: 101 mg/dL — ABNORMAL HIGH (ref 70–99)
Glucose-Capillary: 76 mg/dL (ref 70–99)
Glucose-Capillary: 131 mg/dL — ABNORMAL HIGH (ref 70–99)

## 2018-02-14 LAB — CBC
HCT: 32.8 % — ABNORMAL LOW (ref 36.0–46.0)
HEMOGLOBIN: 10.5 g/dL — AB (ref 12.0–15.0)
MCH: 32.1 pg (ref 26.0–34.0)
MCHC: 32 g/dL (ref 30.0–36.0)
MCV: 100.3 fL — ABNORMAL HIGH (ref 78.0–100.0)
Platelets: 139 10*3/uL — ABNORMAL LOW (ref 150–400)
RBC: 3.27 MIL/uL — ABNORMAL LOW (ref 3.87–5.11)
RDW: 14.2 % (ref 11.5–15.5)
WBC: 7.3 10*3/uL (ref 4.0–10.5)

## 2018-02-14 MED ORDER — LIDOCAINE HCL (PF) 1 % IJ SOLN: 5.0000 mL | INTRAMUSCULAR | Status: DC | PRN

## 2018-02-14 MED ORDER — SODIUM CHLORIDE 0.9 % IV SOLN: 100.0000 mL | INTRAVENOUS | Status: DC | PRN

## 2018-02-14 MED ORDER — DEXTROSE 50 % IV SOLN
INTRAVENOUS | Status: AC
  Administered 2018-02-14: 50 mL
  Filled 2018-02-14: qty 50

## 2018-02-14 MED ORDER — GLUCOSE 40 % PO GEL
ORAL | Status: AC
  Administered 2018-02-14: 37.5 g
  Filled 2018-02-14: qty 1

## 2018-02-14 MED ORDER — INSULIN GLARGINE 100 UNIT/ML ~~LOC~~ SOLN
10.0000 [IU] | Freq: Two times a day (BID) | SUBCUTANEOUS | Status: DC
  Filled 2018-02-14: qty 0.1

## 2018-02-14 MED ORDER — INSULIN GLARGINE 100 UNIT/ML ~~LOC~~ SOLN
8.0000 [IU] | Freq: Two times a day (BID) | SUBCUTANEOUS | Status: DC
  Filled 2018-02-14: qty 0.08

## 2018-02-14 MED ORDER — LIDOCAINE-PRILOCAINE 2.5-2.5 % EX CREA: 1.0000 "application " | TOPICAL_CREAM | CUTANEOUS | Status: DC | PRN

## 2018-02-14 MED ORDER — PENTAFLUOROPROP-TETRAFLUOROETH EX AERO: 1.0000 "application " | INHALATION_SPRAY | CUTANEOUS | Status: DC | PRN

## 2018-02-14 MED ORDER — HEPARIN SODIUM (PORCINE) 1000 UNIT/ML DIALYSIS: 2000.0000 [IU] | Freq: Once | INTRAMUSCULAR | Status: AC

## 2018-02-14 NOTE — Procedures (Signed)
Tolerating HD treatment w/o andy instability. K 3.8, Hgb 10.5 Erling Cruz, MD

## 2018-02-14 NOTE — Progress Notes (Addendum)
Glucose is 39, treated with 1 tube of glucose gel. Will re-evaluate response.   9628: Glucose is 42, 1/2 amp dextrose given. Pt alert, family at bedside.  0730: Glucose is 123, pt's condition stable.

## 2018-02-14 NOTE — Progress Notes (Signed)
INFECTIOUS DISEASE PROGRESS NOTE  ID: Sarah Kelley is a 67 y.o. female is a Spanish Speaking female with ESRD with Principal Problem:   Hyperglycemia Active Problems:   Essential hypertension   Abdominal pain   ESRD (end stage renal disease) (HCC)   Altered mental status   Nausea & vomiting   AVF (arteriovenous fistula) (HCC)   MSSA (methicillin susceptible Staphylococcus aureus) infection  Subjective: Patient lying in bed, in no acute distress, receiving dialysis. She seems more confused today and is unable to answer any questions. Per nurse, patient's son mentioned that she did seem more disoriented today.   Abtx:  Anti-infectives (From admission, onward)   Start     Dose/Rate Route Frequency Ordered Stop   02/14/18 1200  ceFAZolin (ANCEF) IVPB 2g/100 mL premix     2 g 200 mL/hr over 30 Minutes Intravenous Every T-Th-Sa (Hemodialysis) 02/12/18 1504     02/12/18 1700  ceFAZolin (ANCEF) IVPB 2g/100 mL premix     2 g 200 mL/hr over 30 Minutes Intravenous  Once 02/12/18 1504 02/12/18 1836   02/12/18 0000  cefTRIAXone (ROCEPHIN) 1 g in sodium chloride 0.9 % 100 mL IVPB  Status:  Discontinued     1 g 200 mL/hr over 30 Minutes Intravenous Every 24 hours 02/11/18 0144 02/11/18 1749   02/12/18 0000  cefTRIAXone (ROCEPHIN) 2 g in sodium chloride 0.9 % 100 mL IVPB  Status:  Discontinued     2 g 200 mL/hr over 30 Minutes Intravenous Every 24 hours 02/11/18 1749 02/12/18 1504   02/11/18 0030  cefTRIAXone (ROCEPHIN) 1 g in sodium chloride 0.9 % 100 mL IVPB     1 g 200 mL/hr over 30 Minutes Intravenous  Once 02/11/18 0026 02/11/18 0150      Medications: I have reviewed the patient's current medications.  Objective: Vital signs in last 24 hours: Temp:  [97.2 F (36.2 C)-97.8 F (36.6 C)] 97.4 F (36.3 C) (08/15 0850) Pulse Rate:  [80-90] 89 (08/15 0930) Resp:  [11-20] 17 (08/15 0900) BP: (108-133)/(44-66) 108/66 (08/15 0930) SpO2:  [92 %-99 %] 95 % (08/15  0930) Weight:  [69.4 kg] 69.4 kg (08/15 0845)   Constitutional: Resting comfortably in bed. She does seem more confused today and is unable to answer any of my questions.  HENT:  Mouth/Throat: No oropharyngeal exudate.  Multiple missing teeth.   Cardiovascular: Normal rate and regular rhythm.  Pulmonary/Chest: Effort normal and breath sounds normal. No respiratory distress.  Abdominal: Soft. Bowel sounds are normal. She exhibits no distension.  Musculoskeletal: Normal range of motion. LU AVF + bruit/thrill   Neurological: She is unable to answer any of my questions.  Skin: Skin is warm and dry.  Psychiatric: Cannot assess mood and affect due to confusion.   Lab Results Recent Labs    02/13/18 0402 02/14/18 0450  WBC 10.1 7.3  HGB 10.8* 10.5*  HCT 34.6* 32.8*  NA 133* 133*  K 4.2 3.8  CL 92* 92*  CO2 25 24  BUN 41* 61*  CREATININE 5.70* 7.45*   Liver Panel Recent Labs    02/12/18 0418  ALBUMIN 2.5*   Sedimentation Rate No results for input(s): ESRSEDRATE in the last 72 hours. C-Reactive Protein No results for input(s): CRP in the last 72 hours.  Microbiology: Recent Results (from the past 240 hour(s))  Blood culture (routine x 2)     Status: None (Preliminary result)   Collection Time: 02/10/18 10:42 PM  Result Value Ref Range Status  Specimen Description BLOOD RIGHT UPPER ARM  Final   Special Requests   Final    BOTTLES DRAWN AEROBIC ONLY Blood Culture results may not be optimal due to an inadequate volume of blood received in culture bottles   Culture   Final    NO GROWTH 2 DAYS Performed at [facility], [place] [address], [place], [place] [address]    Report Status PENDING  Incomplete  Blood culture (routine x 2)     Status: Abnormal (Preliminary result)   Collection Time: 02/10/18 11:06 PM  Result Value Ref Range Status   Specimen Description BLOOD RIGHT ANTECUBITAL  Final   Special Requests   Final    BOTTLES DRAWN AEROBIC AND ANAEROBIC Blood  Culture adequate volume   Culture  Setup Time   Final    GRAM POSITIVE COCCI IN CLUSTERS AEROBIC BOTTLE ONLY CRITICAL RESULT CALLED TO, READ BACK BY AND VERIFIED WITH: B MANCHERIL PHARMD 02/11/18 1705 JDW    Culture (A)  Final    STAPHYLOCOCCUS AUREUS SUSCEPTIBILITIES TO FOLLOW REPEATING TO CONFIRM SUSCEPTIBILITIES Performed at [facility], [place] [address], [place], [place] [address]    Report Status PENDING  Incomplete  Blood Culture ID Panel (Reflexed)     Status: Abnormal   Collection Time: 02/10/18 11:06 PM  Result Value Ref Range Status   Enterococcus species NOT DETECTED NOT DETECTED Final   Listeria monocytogenes NOT DETECTED NOT DETECTED Final   Staphylococcus species DETECTED (A) NOT DETECTED Final    Comment: CRITICAL RESULT CALLED TO, READ BACK BY AND VERIFIED WITH: B MANCHERIL PHARMD 02/11/18 JDW    Staphylococcus aureus DETECTED (A) NOT DETECTED Final    Comment: Methicillin (oxacillin) susceptible Staphylococcus aureus (MSSA). Preferred therapy is anti staphylococcal beta lactam antibiotic (Cefazolin or Nafcillin), unless clinically contraindicated. CRITICAL RESULT CALLED TO, READ BACK BY AND VERIFIED WITH: B MANCHERIL PHARMD 02/11/18 1705 JDW    Methicillin resistance NOT DETECTED NOT DETECTED Final   Streptococcus species NOT DETECTED NOT DETECTED Final   Streptococcus agalactiae NOT DETECTED NOT DETECTED Final   Streptococcus pneumoniae NOT DETECTED NOT DETECTED Final   Streptococcus pyogenes NOT DETECTED NOT DETECTED Final   Acinetobacter baumannii NOT DETECTED NOT DETECTED Final   Enterobacteriaceae species NOT DETECTED NOT DETECTED Final   Enterobacter cloacae complex NOT DETECTED NOT DETECTED Final   Escherichia coli NOT DETECTED NOT DETECTED Final   Klebsiella oxytoca NOT DETECTED NOT DETECTED Final   Klebsiella pneumoniae NOT DETECTED NOT DETECTED Final   Proteus species NOT DETECTED NOT DETECTED Final   Serratia marcescens NOT DETECTED NOT DETECTED  Final   Haemophilus influenzae NOT DETECTED NOT DETECTED Final   Neisseria meningitidis NOT DETECTED NOT DETECTED Final   Pseudomonas aeruginosa NOT DETECTED NOT DETECTED Final   Candida albicans NOT DETECTED NOT DETECTED Final   Candida glabrata NOT DETECTED NOT DETECTED Final   Candida krusei NOT DETECTED NOT DETECTED Final   Candida parapsilosis NOT DETECTED NOT DETECTED Final   Candida tropicalis NOT DETECTED NOT DETECTED Final    Comment: Performed at [facility], [place] [address] [place], [place] [address]  Urine culture     Status: None   Collection Time: 02/10/18 11:42 PM  Result Value Ref Range Status   Specimen Description URINE, CATHETERIZED  Final   Special Requests NONE  Final   Culture   Final    NO GROWTH Performed at [facility], [address] [place], [place] [address]  Report Status 02/12/2018 FINAL  Final  MRSA PCR Screening     Status: None   Collection Time: 02/11/18  2:43 AM  Result Value Ref Range Status   MRSA by PCR NEGATIVE NEGATIVE Final    Comment:        The GeneXpert MRSA Assay (FDA approved for NASAL specimens only), is one component of a comprehensive MRSA colonization surveillance program. It is not intended to diagnose MRSA infection nor to guide or monitor treatment for MRSA infections. Performed at [facility], [place] [address], [place], [place] [address]     Studies/Results: [place] Lt Upper Extrem Ltd Soft Tissue Non Vascular  Result Date: 02/13/2018 CLINICAL DATA:  Swelling about left upper extremity fistula. EXAM: ULTRASOUND LEFT UPPER EXTREMITY LIMITED TECHNIQUE: Ultrasound examination of the upper extremity soft tissues was performed in the area of clinical concern. COMPARISON:  Fistulogram 12/12/2017. 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 No fluid collection or mass about the fistula is seen. No acute  abnormality is identified. IMPRESSION: Negative for fluid collection or mass.  No acute finding. 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 Electronically Signed   By: Inge Rise M.D.   On: 02/13/2018 10:16     Assessment/Plan: 1. MSSA Bacteremia - Culture 8/11 grew STAPHYLOCOCCUS AUREUS, BCID: MSSA dettected. Repeat blood culture pending - Urine Culture: no growth - Patient has remained afebrile - WBC within normal limits (7.3 today, down from 10.1 8/14) - 8/14 AVF [place]: Negative for fluid collection or mass. No acute finding. 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 - TTE: Normal EF (55-60%) with grade 1 diastolic dysfunction. - Total days of antibiotics: Day 3 Cefazolin - Continue current antibiotics. Waiting for susceptibilities and repeat blood culture.   2. Confusion - Patient does seem more confused today and is unable to answer my questions. Encephalopathy is likely multifactorial in nature (electrolyte abnormalities 2/2 ESRD and sepsis most likely 2/2 AVF). Patient's most recent labs from today shows hyponatremia (133), azotemia (61), and Creatinine (7.45 increased from 5.70 yesterday). Patient is currently in dialysis which should alleviate her electrolyte abnormalities.          Carroll Sage MD

## 2018-02-14 NOTE — Progress Notes (Signed)
Inpatient Diabetes Program Recommendations  AACE/ADA: New Consensus Statement on Inpatient Glycemic Control (2015)  Target Ranges:  Prepandial:   less than 140 mg/dL      Peak postprandial:   less than 180 mg/dL (1-2 hours)      Critically ill patients:  140 - 180 mg/dL   Lab Results  Component Value Date   GLUCAP 76 02/14/2018   HGBA1C 8.2 07/25/2017    Review of Glycemic Control  Hypoglycemia today.  ESRD and sensitive to insulin.   Inpatient Diabetes Program Recommendations:     Reduce Novolog to 0-9 units tidwc and hs  Will continue to follow.  Thank you. Lorenda Peck, RD, LDN, CDE Inpatient Diabetes Coordinator [phone]

## 2018-02-14 NOTE — Progress Notes (Signed)
PROGRESS NOTE                                                                                                                                                                                                             Patient Demographics:    Sarah Kelley, is a 67 y.o. female, DOB - 1950-11-15, TKW:[id number]  Admit date - 02/10/2018   Admitting Physician Jani Gravel, MD  Outpatient Primary MD for the patient is [name], MD  LOS - 3   Chief Complaint  Patient presents with  . Altered Mental Status  . Hypoglycemia       Brief Narrative   67 y.o.female,w hypertension, hyperlipidemia, Dm2, ESRD on HD (T, T, S), presents with weakness and vomiting last Sunday, as well she was confused, she was noted to have fever 101.4, leukocytosis of 13 point 1K, he receives hemodialysis via long-standing aVF and left upper extremity, her work-up significant for MSSA bacteremia.   Subjective:    Adeleigh Kelley today is more confused, has hypoglycemia this morning, cannot provide any complaints  Principal Problem:   Hyperglycemia Active Problems:   Essential hypertension   Abdominal pain   ESRD (end stage renal disease) (HCC)   Altered mental status   Nausea & vomiting   AVF (arteriovenous fistula) (HCC)   MSSA (methicillin susceptible Staphylococcus aureus) infection  MSSA bacteremia -End-stage renal disease patient, on hemodialysis, she is receiving dialysis via left upper extremity aVF, there is no indwelling lines. -ID input greatly appreciated, initial cultures 02/10/2018 growing MSSA, blood culture 02/13/2018, with no growth so far, he is on IV cefazolin post hemodialysis, 2D echo no significant valvular disease,  ultrasound of aVF showing 75% stenosis, tabetic management per ID - CT scan abd/ pelvis negative except for bladder wall thickening  Encephalopathy -In the setting of infectious process, fluctuating, most likely due  to hospital delirium, I will obtain CT head to rule out acute pathology.  Uncontrolled DM2 -This post insulin drip initially, I will lower her Lantus 15 units twice daily given hypoglycemia yesterday, will change to 8 units twice daily, continue with insulin sliding scale.  ESRD: on HD TTS - seen by renal service, on hemodialysis TTS  Hypertension - cont home Diovan 160mg  po qday, hold if BP < 125  Diabetic neuropathy - Cont cymbalta 60mg  po qday - Cont Neurontin  lower to 300 mg po qhs  Hyperlipidmeia - Cont Lipitor 40mg  po qhs  Patient with some epigastric pain today, repeat lipase in a.m., CT abdomen pelvis on admission with no acute findings, will continue to monitor closely   Code Status : Full  Family Communication  : Son at bedside  Disposition Plan  : Home when stable  Consults  :  ID, renal  Procedures  : none  DVT Prophylaxis  :  Loa heparin  Lab Results  Component Value Date   PLT 139 (L) 02/14/2018    Antibiotics  :    Anti-infectives (From admission, onward)   Start     Dose/Rate Route Frequency Ordered Stop   02/14/18 1200  ceFAZolin (ANCEF) IVPB 2g/100 mL premix     2 g 200 mL/hr over 30 Minutes Intravenous Every T-Th-Sa (Hemodialysis) 02/12/18 1504     02/12/18 1700  ceFAZolin (ANCEF) IVPB 2g/100 mL premix     2 g 200 mL/hr over 30 Minutes Intravenous  Once 02/12/18 1504 02/12/18 1836   02/12/18 0000  cefTRIAXone (ROCEPHIN) 1 g in sodium chloride 0.9 % 100 mL IVPB  Status:  Discontinued     1 g 200 mL/hr over 30 Minutes Intravenous Every 24 hours 02/11/18 0144 02/11/18 1749   02/12/18 0000  cefTRIAXone (ROCEPHIN) 2 g in sodium chloride 0.9 % 100 mL IVPB  Status:  Discontinued     2 g 200 mL/hr over 30 Minutes Intravenous Every 24 hours 02/11/18 1749 02/12/18 1504   02/11/18 0030  cefTRIAXone (ROCEPHIN) 1 g in sodium chloride 0.9 % 100 mL IVPB     1 g 200 mL/hr over 30 Minutes Intravenous  Once 02/11/18 0026 02/11/18 0150         Objective:   Vitals:   02/14/18 1100 02/14/18 1130 02/14/18 1200 02/14/18 1230  BP: (!) 121/43 (!) 128/51 (!) 132/56 (!) 118/57  Pulse: 87 87 88 88  Resp: 19 18 17 16   Temp:      TempSrc:      SpO2:   96% 94%  Weight:      Height:        Wt Readings from Last 3 Encounters:  02/14/18 69.4 kg  12/31/17 68 kg  11/14/17 72.6 kg     Intake/Output Summary (Last 24 hours) at 02/14/2018 1355 Last data filed at 02/13/2018 2000 Gross per 24 hour  Intake 240 ml  Output 0 ml  Net 240 ml     Physical Exam  Confused, unable to answer questions or follow commands Symmetrical Chest wall movement, Good air movement bilaterally, CTAB RRR,No Gallops,Rubs or new Murmurs, No Parasternal Heave +ve B.Sounds, has some epigastric tenderness- guarding or rigidity. No Cyanosis, Clubbing or edema, No new Rash or bruise      Data Review:    CBC Recent Labs  Lab 02/10/18 2106 02/11/18 0733 02/12/18 0418 02/13/18 0402 02/14/18 0450  WBC 13.1* 18.0* 15.6* 10.1 7.3  HGB 12.6 11.4* 11.3* 10.8* 10.5*  HCT 39.3 34.1* 35.6* 34.6* 32.8*  PLT 125* 117* 120* 134* 139*  MCV 100.8* 95.5 100.6* 101.2* 100.3*  MCH 32.3 31.9 31.9 31.6 32.1  MCHC 32.1 33.4 31.7 31.2 32.0  RDW 13.8 13.9 14.6 14.6 14.2    Chemistries  Recent Labs  Lab 02/10/18 2106  02/11/18 0940 02/11/18 1418 02/12/18 0418 02/13/18 0402 02/14/18 0450  NA 130*  --  138 136 138 133* 133*  K 5.3*  --  5.1 5.4* 5.1 4.2 3.8  CL  88*  --  99 98 95* 92* 92*  CO2 23  --  21* 23 27 25 24   GLUCOSE 749*  --  162* 116* 104* 269* 53*  BUN 49*  --  56* 58* 64* 41* 61*  CREATININE 6.45*   < > 7.33* 7.62* 8.75* 5.70* 7.45*  CALCIUM 9.3  --  9.3 9.0 9.3 8.6* 8.6*  AST 24  --  24  --   --   --   --   ALT 18  --  17  --   --   --   --   ALKPHOS 137*  --  106  --   --   --   --   BILITOT 0.8  --  0.7  --   --   --   --    < > = values in this interval not displayed.    ------------------------------------------------------------------------------------------------------------------ No results for input(s): CHOL, HDL, LDLCALC, TRIG, CHOLHDL, LDLDIRECT in the last 72 hours.  Lab Results  Component Value Date   HGBA1C 8.2 07/25/2017   ------------------------------------------------------------------------------------------------------------------ No results for input(s): TSH, T4TOTAL, T3FREE, THYROIDAB in the last 72 hours.  Invalid input(s): FREET3 ------------------------------------------------------------------------------------------------------------------ No results for input(s): VITAMINB12, FOLATE, FERRITIN, TIBC, IRON, RETICCTPCT in the last 72 hours.  Coagulation profile No results for input(s): INR, PROTIME in the last 168 hours.  No results for input(s): DDIMER in the last 72 hours.  Cardiac Enzymes Recent Labs  Lab 02/11/18 0228 02/11/18 0940 02/11/18 1418  TROPONINI 0.51* 0.69* 0.56*   ------------------------------------------------------------------------------------------------------------------ No results found for: BNP  Inpatient Medications  Scheduled Meds: . atorvastatin  40 mg Oral Daily  . Chlorhexidine Gluconate Cloth  6 each Topical Q0600  . DULoxetine  60 mg Oral Daily  . feeding supplement (PRO-STAT SUGAR FREE 64)  30 mL Oral BID  . gabapentin  300 mg Oral QHS  . heparin  5,000 Units Subcutaneous Q8H  . insulin aspart  0-15 Units Subcutaneous TID WC  . insulin aspart  0-5 Units Subcutaneous QHS  . insulin glargine  10 Units Subcutaneous BID  . irbesartan  150 mg Oral Daily  . loratadine  10 mg Oral Daily  . multivitamin  1 tablet Oral QHS  . sevelamer carbonate  800 mg Oral TID WC   Continuous Infusions: . sodium chloride    . sodium chloride    .  ceFAZolin (ANCEF) IV 2 g (02/14/18 1233)   PRN Meds:.sodium chloride, sodium chloride, acetaminophen **OR** acetaminophen, lidocaine (PF),  lidocaine-prilocaine, ondansetron (ZOFRAN) IV, pentafluoroprop-tetrafluoroeth  Micro Results Recent Results (from the past 240 hour(s))  Blood culture (routine x 2)     Status: None (Preliminary result)   Collection Time: 02/10/18 10:42 PM  Result Value Ref Range Status   Specimen Description BLOOD RIGHT UPPER ARM  Final   Special Requests   Final    BOTTLES DRAWN AEROBIC ONLY Blood Culture results may not be optimal due to an inadequate volume of blood received in culture bottles   Culture   Final    NO GROWTH 3 DAYS Performed at [facility], [place] [address], [place], [place] [address]    Report Status PENDING  Incomplete  Blood culture (routine x 2)     Status: Abnormal (Preliminary result)   Collection Time: 02/10/18 11:06 PM  Result Value Ref Range Status   Specimen Description BLOOD RIGHT ANTECUBITAL  Final   Special Requests   Final    BOTTLES DRAWN AEROBIC AND ANAEROBIC Blood  Culture adequate volume   Culture  Setup Time   Final    GRAM POSITIVE COCCI IN CLUSTERS AEROBIC BOTTLE ONLY CRITICAL RESULT CALLED TO, READ BACK BY AND VERIFIED WITH: B MANCHERIL PHARMD 02/11/18 1705 JDW    Culture (A)  Final    STAPHYLOCOCCUS AUREUS SUSCEPTIBILITIES TO FOLLOW REPEATING TO CONFIRM SUSCEPTIBILITIES Performed at [facility], [place] [address] [place], [place] [address]    Report Status PENDING  Incomplete  Blood Culture ID Panel (Reflexed)     Status: Abnormal   Collection Time: 02/10/18 11:06 PM  Result Value Ref Range Status   Enterococcus species NOT DETECTED NOT DETECTED Final   Listeria monocytogenes NOT DETECTED NOT DETECTED Final   Staphylococcus species DETECTED (A) NOT DETECTED Final    Comment: CRITICAL RESULT CALLED TO, READ BACK BY AND VERIFIED WITH: B MANCHERIL PHARMD 02/11/18 JDW    Staphylococcus aureus DETECTED (A) NOT DETECTED Final    Comment: Methicillin (oxacillin) susceptible Staphylococcus aureus (MSSA). Preferred therapy is anti staphylococcal beta  lactam antibiotic (Cefazolin or Nafcillin), unless clinically contraindicated. CRITICAL RESULT CALLED TO, READ BACK BY AND VERIFIED WITH: B MANCHERIL PHARMD 02/11/18 1705 JDW    Methicillin resistance NOT DETECTED NOT DETECTED Final   Streptococcus species NOT DETECTED NOT DETECTED Final   Streptococcus agalactiae NOT DETECTED NOT DETECTED Final   Streptococcus pneumoniae NOT DETECTED NOT DETECTED Final   Streptococcus pyogenes NOT DETECTED NOT DETECTED Final   Acinetobacter baumannii NOT DETECTED NOT DETECTED Final   Enterobacteriaceae species NOT DETECTED NOT DETECTED Final   Enterobacter cloacae complex NOT DETECTED NOT DETECTED Final   Escherichia coli NOT DETECTED NOT DETECTED Final   Klebsiella oxytoca NOT DETECTED NOT DETECTED Final   Klebsiella pneumoniae NOT DETECTED NOT DETECTED Final   Proteus species NOT DETECTED NOT DETECTED Final   Serratia marcescens NOT DETECTED NOT DETECTED Final   Haemophilus influenzae NOT DETECTED NOT DETECTED Final   Neisseria meningitidis NOT DETECTED NOT DETECTED Final   Pseudomonas aeruginosa NOT DETECTED NOT DETECTED Final   Candida albicans NOT DETECTED NOT DETECTED Final   Candida glabrata NOT DETECTED NOT DETECTED Final   Candida krusei NOT DETECTED NOT DETECTED Final   Candida parapsilosis NOT DETECTED NOT DETECTED Final   Candida tropicalis NOT DETECTED NOT DETECTED Final    Comment: Performed at [facility], [place] [address], [place], [place] [address]  Urine culture     Status: None   Collection Time: 02/10/18 11:42 PM  Result Value Ref Range Status   Specimen Description URINE, CATHETERIZED  Final   Special Requests NONE  Final   Culture   Final    NO GROWTH Performed at [facility], [address], [place], [place] [address]    Report Status 02/12/2018 FINAL  Final  MRSA PCR Screening     Status: None   Collection Time: 02/11/18  2:43 AM  Result Value Ref Range Status   MRSA by PCR NEGATIVE NEGATIVE Final     Comment:        The GeneXpert MRSA Assay (FDA approved for NASAL specimens only), is one component of a comprehensive MRSA colonization surveillance program. It is not intended to diagnose MRSA infection nor to guide or monitor treatment for MRSA infections. Performed at [facility], [place] [address], [place], [place] [address]   Culture, blood (Routine X 2) w Reflex to ID Panel     Status: None (Preliminary result)   Collection Time: 02/13/18  4:04  AM  Result Value Ref Range Status   Specimen Description BLOOD RIGHT HAND  Final   Special Requests   Final    BOTTLES DRAWN AEROBIC AND ANAEROBIC Blood Culture adequate volume   Culture   Final    NO GROWTH 1 DAY Performed at [facility], [address] [place], [place] [address]    Report Status PENDING  Incomplete    Radiology Reports Ct Abdomen Pelvis Wo Contrast  Result Date: 02/11/2018 CLINICAL DATA:  Abdominal pain. Vomiting. Difficulty with diabetes. EXAM: CT ABDOMEN AND PELVIS WITHOUT CONTRAST TECHNIQUE: Multidetector CT imaging of the abdomen and pelvis was performed following the standard protocol without IV contrast. COMPARISON:  07/28/2016 FINDINGS: Lower chest: Small bilateral pleural effusions with basilar atelectasis. Improved since previous study. Small esophageal hiatal hernia. Coronary artery calcifications. Hepatobiliary: No focal liver abnormality is seen. No gallstones, gallbladder wall thickening, or biliary dilatation. Pancreas: Unremarkable. No pancreatic ductal dilatation or surrounding inflammatory changes. Spleen: Normal in size without focal abnormality. Adrenals/Urinary Tract: No adrenal gland nodules. Bilateral renal atrophy. No hydronephrosis or hydroureter. No renal, ureteral, or bladder stones. Bladder wall is diffusely thickened with small amount of gas in the bladder. This could represent cystitis. Stomach/Bowel: Stomach, small bowel, and colon are not abnormally distended. Stool fills the  colon. Small bowel are decompressed. No wall thickening is identified although under distention limits evaluation of bowel wall. Appendix is not identified. Vascular/Lymphatic: Aortic atherosclerosis. No enlarged abdominal or pelvic lymph nodes. Reproductive: Uterus and bilateral adnexa are unremarkable. Other: There is a moderate-sized infraumbilical abdominal wall hernia at the midline containing fat. No free air or free fluid in the abdomen. Small right inguinal hernia containing fat. Musculoskeletal: No acute or significant osseous findings. IMPRESSION: 1. Small bilateral pleural effusions with basilar atelectasis. Small esophageal hiatal hernia. Bilateral renal atrophy. Bladder wall thickening with small amount of gas in the bladder. This could represent cystitis. 2. Moderate-sized infraumbilical abdominal wall hernia containing fat. Small right inguinal hernia containing fat. Electronically Signed   By: Lucienne Capers M.D.   On: 02/11/2018 04:48   Dg Chest Port 1 View  Result Date: 02/10/2018 CLINICAL DATA:  Vomiting EXAM: PORTABLE CHEST 1 VIEW COMPARISON:  11/14/2017 FINDINGS: Cardiac shadow is at the upper limits of normal in size. The lungs are hypoinflated with mild left basilar atelectasis. No bony abnormality is seen. Stent is again noted in the left cephalic vein. IMPRESSION: Left basilar atelectasis. Electronically Signed   By: Inez Catalina M.D.   On: 02/10/2018 23:34   [place] Lt Upper Extrem Ltd Soft Tissue Non Vascular  Result Date: 02/13/2018 CLINICAL DATA:  Swelling about left upper extremity fistula. EXAM: ULTRASOUND LEFT UPPER EXTREMITY LIMITED TECHNIQUE: Ultrasound examination of the upper extremity soft tissues was performed in the area of clinical concern. COMPARISON:  Fistulogram 12/12/2017. 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 No fluid collection or mass about the fistula is  seen. No acute abnormality is identified. IMPRESSION: Negative for fluid collection or mass.  No acute finding. 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 Electronically Signed   By: Inge Rise M.D.   On: 02/13/2018 10:16     Phillips Climes M.D on 02/14/2018 at 1:55 PM  Between 7am to 7pm - Pager - [phone]  After 7pm go to [email or web address] - password [email or web address]  [facility] -  Office  [phone]

## 2018-02-15 ENCOUNTER — Inpatient Hospital Stay (HOSPITAL_COMMUNITY): Payer: Medicaid Other

## 2018-02-15 ENCOUNTER — Encounter (HOSPITAL_COMMUNITY): Payer: Self-pay | Admitting: General Practice

## 2018-02-15 ENCOUNTER — Other Ambulatory Visit: Payer: Self-pay

## 2018-02-15 DIAGNOSIS — R079 Chest pain, unspecified: Secondary | ICD-10-CM

## 2018-02-15 DIAGNOSIS — R109 Unspecified abdominal pain: Secondary | ICD-10-CM

## 2018-02-15 DIAGNOSIS — R0789 Other chest pain: Secondary | ICD-10-CM

## 2018-02-15 DIAGNOSIS — I4510 Unspecified right bundle-branch block: Secondary | ICD-10-CM

## 2018-02-15 LAB — CULTURE, BLOOD (ROUTINE X 2): SPECIAL REQUESTS: ADEQUATE

## 2018-02-15 LAB — GLUCOSE, CAPILLARY
GLUCOSE-CAPILLARY: 103 mg/dL — AB (ref 70–99)
GLUCOSE-CAPILLARY: 109 mg/dL — AB (ref 70–99)
GLUCOSE-CAPILLARY: 113 mg/dL — AB (ref 70–99)
GLUCOSE-CAPILLARY: 166 mg/dL — AB (ref 70–99)
GLUCOSE-CAPILLARY: 170 mg/dL — AB (ref 70–99)
GLUCOSE-CAPILLARY: 178 mg/dL — AB (ref 70–99)
Glucose-Capillary: 100 mg/dL — ABNORMAL HIGH (ref 70–99)

## 2018-02-15 LAB — LIPASE, BLOOD: LIPASE: 27 U/L (ref 11–51)

## 2018-02-15 LAB — BASIC METABOLIC PANEL
ANION GAP: 13 (ref 5–15)
BUN: 24 mg/dL — ABNORMAL HIGH (ref 8–23)
CALCIUM: 8.8 mg/dL — AB (ref 8.9–10.3)
CO2: 27 mmol/L (ref 22–32)
Chloride: 99 mmol/L (ref 98–111)
Creatinine, Ser: 4.55 mg/dL — ABNORMAL HIGH (ref 0.44–1.00)
GFR, EST AFRICAN AMERICAN: 10 mL/min — AB (ref >60)
GFR, EST NON AFRICAN AMERICAN: 9 mL/min — AB (ref >60)
GLUCOSE: 109 mg/dL — AB (ref 70–99)
POTASSIUM: 4 mmol/L (ref 3.5–5.1)
SODIUM: 139 mmol/L (ref 135–145)

## 2018-02-15 LAB — TROPONIN I: Troponin I: 0.07 ng/mL (ref <0.03)

## 2018-02-15 LAB — CBC
HCT: 34.7 % — ABNORMAL LOW (ref 36.0–46.0)
Hemoglobin: 11.3 g/dL — ABNORMAL LOW (ref 12.0–15.0)
MCH: 31.7 pg (ref 26.0–34.0)
MCHC: 32.6 g/dL (ref 30.0–36.0)
MCV: 97.5 fL (ref 78.0–100.0)
PLATELETS: 113 10*3/uL — AB (ref 150–400)
RBC: 3.56 MIL/uL — ABNORMAL LOW (ref 3.87–5.11)
RDW: 14.4 % (ref 11.5–15.5)
WBC: 6.4 10*3/uL (ref 4.0–10.5)

## 2018-02-15 LAB — AMMONIA: AMMONIA: 34 umol/L (ref 9–35)

## 2018-02-15 MED ORDER — CHLORHEXIDINE GLUCONATE CLOTH 2 % EX PADS
6.0000 | MEDICATED_PAD | Freq: Every day | CUTANEOUS | Status: DC
  Administered 2018-02-16 – 2018-02-17 (×2): 6 via TOPICAL

## 2018-02-15 MED ORDER — IOHEXOL 300 MG/ML  SOLN
100.0000 mL | Freq: Once | INTRAMUSCULAR | Status: AC | PRN
  Administered 2018-02-15: 100 mL via INTRAVENOUS

## 2018-02-15 NOTE — Progress Notes (Addendum)
PROGRESS NOTE                                                                                                                                                                                                             Patient Demographics:    Sarah Kelley, is a 67 y.o. female, DOB - 1951-01-06, OEV:[id number]  Admit date - 02/10/2018   Admitting Physician Jani Gravel, MD  Outpatient Primary MD for the patient is [name], MD  LOS - 4   Chief Complaint  Patient presents with  . Altered Mental Status  . Hypoglycemia       Brief Narrative   67 y.o.female,w hypertension, hyperlipidemia, Dm2, ESRD on HD (T, T, S), presents with weakness and vomiting last Sunday, as well she was confused, she was noted to have fever 101.4, leukocytosis of 13 point 1K, he receives hemodialysis via long-standing aVF and left upper extremity, her work-up significant for MSSA bacteremia.   Subjective:    [name] today better appetite yesterday with good dinner, but she does remain confused .   Principal Problem:   Hyperglycemia Active Problems:   Essential hypertension   Abdominal pain   ESRD (end stage renal disease) (HCC)   Altered mental status   Nausea & vomiting   AVF (arteriovenous fistula) (HCC)   MSSA (methicillin susceptible Staphylococcus aureus) infection  MSSA bacteremia -End-stage renal disease patient, on hemodialysis, she is receiving dialysis via left upper extremity aVF, there is no indwelling lines. -The input greatly appreciated, MSSA bacteremia on initial cultures 02/10/2018, surveillance cultures 18 so far are negative, management per ID, she is on IV cefazolin post hemodialysis, 2D echo no significant valvular disease,  ultrasound of aVF showing 75% stenosis, - CT scan abd/ pelvis negative except for bladder wall thickening  Encephalopathy -Initially somnolent, but did not improve, , again with worsening  rotation over last 48 hours, repeat CT head obtained yesterday with no acute findings   Uncontrolled DM2 -This post insulin drip initially, dizziness or sliding scale, given her current hypoglycemia, poor oral intake, Lantus has been stopped, so far CBGs are acceptable on sliding scale alone .  ESRD: on HD TTS - seen by renal service, on hemodialysis TTS  Hypertension - cont home Diovan 160mg  po qday, hold if BP < 125  Diabetic neuropathy - Cont cymbalta 60mg  po  qday - Cont Neurontin lower to 300 mg po qhs  Hyperlipidmeia - Cont Lipitor 40mg  po qhs  Patient with some epigastric pain today, repeat lipase in a.m., CT abdomen pelvis on admission with no acute findings, will continue to monitor closely  Addendum: -Was notified by ID team patient had complained of chest pain, he did not have any chest pain when I saw her earlier today, EKG was obtained, significant for right bundle branch block, no acute ST or T wave abnormalities, her troponins at 0.07, which is lower than her baseline, patient continues to have complaints of intermittent abdominal pain, I will repeat CT abdomen pelvis with IV contrast, lipase within normal limit, remains confused, CT head yesterday with no acute findings, will check ammonia level, was unable to obtain ABG, will proceed with VBG.  Code Status : Full  Family Communication  : Daughter at bedside  Disposition Plan  : Home when stable  Consults  :  ID, renal  Procedures  : none  DVT Prophylaxis  :  [place] heparin  Lab Results  Component Value Date   PLT 113 (L) 02/15/2018    Antibiotics  :    Anti-infectives (From admission, onward)   Start     Dose/Rate Route Frequency Ordered Stop   02/14/18 1200  ceFAZolin (ANCEF) IVPB 2g/100 mL premix     2 g 200 mL/hr over 30 Minutes Intravenous Every T-Th-Sa (Hemodialysis) 02/12/18 1504     02/12/18 1700  ceFAZolin (ANCEF) IVPB 2g/100 mL premix     2 g 200 mL/hr over 30 Minutes Intravenous  Once  02/12/18 1504 02/12/18 1836   02/12/18 0000  cefTRIAXone (ROCEPHIN) 1 g in sodium chloride 0.9 % 100 mL IVPB  Status:  Discontinued     1 g 200 mL/hr over 30 Minutes Intravenous Every 24 hours 02/11/18 0144 02/11/18 1749   02/12/18 0000  cefTRIAXone (ROCEPHIN) 2 g in sodium chloride 0.9 % 100 mL IVPB  Status:  Discontinued     2 g 200 mL/hr over 30 Minutes Intravenous Every 24 hours 02/11/18 1749 02/12/18 1504   02/11/18 0030  cefTRIAXone (ROCEPHIN) 1 g in sodium chloride 0.9 % 100 mL IVPB     1 g 200 mL/hr over 30 Minutes Intravenous  Once 02/11/18 0026 02/11/18 0150        Objective:   Vitals:   02/15/18 0004 02/15/18 0503 02/15/18 0506 02/15/18 0814  BP: (!) 109/49 (!) 145/77    Pulse: 86 87    Resp: 15 14    Temp:  98.3 F (36.8 C)  99 F (37.2 C)  TempSrc:  Oral    SpO2: 94% 92%    Weight:   66.5 kg   Height:        Wt Readings from Last 3 Encounters:  02/15/18 66.5 kg  12/31/17 68 kg  11/14/17 72.6 kg     Intake/Output Summary (Last 24 hours) at 02/15/2018 1112 Last data filed at 02/14/2018 1945 Gross per 24 hour  Intake 120 ml  Output 700 ml  Net -580 ml     Physical Exam  Somnolent, remains confused per family  Symmetrical Chest wall movement, Good air movement bilaterally, CTAB RRR,No Gallops,Rubs or new Murmurs, No Parasternal Heave +ve B.Sounds, Abd Soft, No tenderness, No rebound - guarding or rigidity. No Cyanosis, Clubbing or edema, No new Rash or bruise      Data Review:    CBC Recent Labs  Lab 02/11/18 0733 02/12/18 0418 02/13/18 0402 02/14/18  0450 02/15/18 0527  WBC 18.0* 15.6* 10.1 7.3 6.4  HGB 11.4* 11.3* 10.8* 10.5* 11.3*  HCT 34.1* 35.6* 34.6* 32.8* 34.7*  PLT 117* 120* 134* 139* 113*  MCV 95.5 100.6* 101.2* 100.3* 97.5  MCH 31.9 31.9 31.6 32.1 31.7  MCHC 33.4 31.7 31.2 32.0 32.6  RDW 13.9 14.6 14.6 14.2 14.4    Chemistries  Recent Labs  Lab 02/10/18 2106  02/11/18 0940 02/11/18 1418 02/12/18 0418 02/13/18 0402  02/14/18 0450 02/15/18 0527  NA 130*  --  138 136 138 133* 133* 139  K 5.3*  --  5.1 5.4* 5.1 4.2 3.8 4.0  CL 88*  --  99 98 95* 92* 92* 99  CO2 23  --  21* 23 27 25 24 27   GLUCOSE 749*  --  162* 116* 104* 269* 53* 109*  BUN 49*  --  56* 58* 64* 41* 61* 24*  CREATININE 6.45*   < > 7.33* 7.62* 8.75* 5.70* 7.45* 4.55*  CALCIUM 9.3  --  9.3 9.0 9.3 8.6* 8.6* 8.8*  AST 24  --  24  --   --   --   --   --   ALT 18  --  17  --   --   --   --   --   ALKPHOS 137*  --  106  --   --   --   --   --   BILITOT 0.8  --  0.7  --   --   --   --   --    < > = values in this interval not displayed.   ------------------------------------------------------------------------------------------------------------------ No results for input(s): CHOL, HDL, LDLCALC, TRIG, CHOLHDL, LDLDIRECT in the last 72 hours.  Lab Results  Component Value Date   HGBA1C 8.2 07/25/2017   ------------------------------------------------------------------------------------------------------------------ No results for input(s): TSH, T4TOTAL, T3FREE, THYROIDAB in the last 72 hours.  Invalid input(s): FREET3 ------------------------------------------------------------------------------------------------------------------ No results for input(s): VITAMINB12, FOLATE, FERRITIN, TIBC, IRON, RETICCTPCT in the last 72 hours.  Coagulation profile No results for input(s): INR, PROTIME in the last 168 hours.  No results for input(s): DDIMER in the last 72 hours.  Cardiac Enzymes Recent Labs  Lab 02/11/18 0228 02/11/18 0940 02/11/18 1418  TROPONINI 0.51* 0.69* 0.56*   ------------------------------------------------------------------------------------------------------------------ No results found for: BNP  Inpatient Medications  Scheduled Meds: . atorvastatin  40 mg Oral Daily  . Chlorhexidine Gluconate Cloth  6 each Topical Q0600  . DULoxetine  60 mg Oral Daily  . feeding supplement (PRO-STAT SUGAR FREE 64)  30 mL Oral  BID  . gabapentin  300 mg Oral QHS  . heparin  5,000 Units Subcutaneous Q8H  . insulin aspart  0-15 Units Subcutaneous TID WC  . insulin aspart  0-5 Units Subcutaneous QHS  . irbesartan  150 mg Oral Daily  . loratadine  10 mg Oral Daily  . multivitamin  1 tablet Oral QHS  . sevelamer carbonate  800 mg Oral TID WC   Continuous Infusions: .  ceFAZolin (ANCEF) IV 2 g (02/14/18 1233)   PRN Meds:.acetaminophen **OR** acetaminophen, ondansetron (ZOFRAN) IV  Micro Results Recent Results (from the past 240 hour(s))  Blood culture (routine x 2)     Status: None (Preliminary result)   Collection Time: 02/10/18 10:42 PM  Result Value Ref Range Status   Specimen Description BLOOD RIGHT UPPER ARM  Final   Special Requests   Final    BOTTLES DRAWN AEROBIC ONLY Blood Culture results may not be  optimal due to an inadequate volume of blood received in culture bottles   Culture   Final    NO GROWTH 3 DAYS Performed at [facility], [place] [address] [place], [place] [address]    Report Status PENDING  Incomplete  Blood culture (routine x 2)     Status: Abnormal   Collection Time: 02/10/18 11:06 PM  Result Value Ref Range Status   Specimen Description BLOOD RIGHT ANTECUBITAL  Final   Special Requests   Final    BOTTLES DRAWN AEROBIC AND ANAEROBIC Blood Culture adequate volume   Culture  Setup Time   Final    GRAM POSITIVE COCCI IN CLUSTERS AEROBIC BOTTLE ONLY CRITICAL RESULT CALLED TO, READ BACK BY AND VERIFIED WITH: B MANCHERIL PHARMD 02/11/18 1705 JDW Performed at [facility], [place] [address] [place], [place] [address]    Culture STAPHYLOCOCCUS AUREUS (A)  Final   Report Status 02/15/2018 FINAL  Final   Organism ID, Bacteria STAPHYLOCOCCUS AUREUS  Final      Susceptibility   Staphylococcus aureus - MIC*    CIPROFLOXACIN <=0.5 SENSITIVE Sensitive     ERYTHROMYCIN <=0.25 SENSITIVE Sensitive     GENTAMICIN <=0.5 SENSITIVE Sensitive     OXACILLIN 0.5 SENSITIVE Sensitive      TETRACYCLINE <=1 SENSITIVE Sensitive     VANCOMYCIN 1 SENSITIVE Sensitive     TRIMETH/SULFA <=10 SENSITIVE Sensitive     CLINDAMYCIN <=0.25 SENSITIVE Sensitive     RIFAMPIN <=0.5 SENSITIVE Sensitive     Inducible Clindamycin NEGATIVE Sensitive     * STAPHYLOCOCCUS AUREUS  Blood Culture ID Panel (Reflexed)     Status: Abnormal   Collection Time: 02/10/18 11:06 PM  Result Value Ref Range Status   Enterococcus species NOT DETECTED NOT DETECTED Final   Listeria monocytogenes NOT DETECTED NOT DETECTED Final   Staphylococcus species DETECTED (A) NOT DETECTED Final    Comment: CRITICAL RESULT CALLED TO, READ BACK BY AND VERIFIED WITH: B MANCHERIL PHARMD 02/11/18 JDW    Staphylococcus aureus DETECTED (A) NOT DETECTED Final    Comment: Methicillin (oxacillin) susceptible Staphylococcus aureus (MSSA). Preferred therapy is anti staphylococcal beta lactam antibiotic (Cefazolin or Nafcillin), unless clinically contraindicated. CRITICAL RESULT CALLED TO, READ BACK BY AND VERIFIED WITH: B MANCHERIL PHARMD 02/11/18 1705 JDW    Methicillin resistance NOT DETECTED NOT DETECTED Final   Streptococcus species NOT DETECTED NOT DETECTED Final   Streptococcus agalactiae NOT DETECTED NOT DETECTED Final   Streptococcus pneumoniae NOT DETECTED NOT DETECTED Final   Streptococcus pyogenes NOT DETECTED NOT DETECTED Final   Acinetobacter baumannii NOT DETECTED NOT DETECTED Final   Enterobacteriaceae species NOT DETECTED NOT DETECTED Final   Enterobacter cloacae complex NOT DETECTED NOT DETECTED Final   Escherichia coli NOT DETECTED NOT DETECTED Final   Klebsiella oxytoca NOT DETECTED NOT DETECTED Final   Klebsiella pneumoniae NOT DETECTED NOT DETECTED Final   Proteus species NOT DETECTED NOT DETECTED Final   Serratia marcescens NOT DETECTED NOT DETECTED Final   Haemophilus influenzae NOT DETECTED NOT DETECTED Final   Neisseria meningitidis NOT DETECTED NOT DETECTED Final   Pseudomonas aeruginosa NOT DETECTED NOT  DETECTED Final   Candida albicans NOT DETECTED NOT DETECTED Final   Candida glabrata NOT DETECTED NOT DETECTED Final   Candida krusei NOT DETECTED NOT DETECTED Final   Candida parapsilosis NOT DETECTED NOT DETECTED Final   Candida tropicalis NOT DETECTED NOT DETECTED Final    Comment: Performed at [facility], [place] [address],  [place], [place] [address]  Urine culture     Status: None   Collection Time: 02/10/18 11:42 PM  Result Value Ref Range Status   Specimen Description URINE, CATHETERIZED  Final   Special Requests NONE  Final   Culture   Final    NO GROWTH Performed at [facility], [address], [place], [place] [address]    Report Status 02/12/2018 FINAL  Final  MRSA PCR Screening     Status: None   Collection Time: 02/11/18  2:43 AM  Result Value Ref Range Status   MRSA by PCR NEGATIVE NEGATIVE Final    Comment:        The GeneXpert MRSA Assay (FDA approved for NASAL specimens only), is one component of a comprehensive MRSA colonization surveillance program. It is not intended to diagnose MRSA infection nor to guide or monitor treatment for MRSA infections. Performed at [facility], [place] [address], [place], [place] [address]   Culture, blood (Routine X 2) w Reflex to ID Panel     Status: None (Preliminary result)   Collection Time: 02/13/18  4:04 AM  Result Value Ref Range Status   Specimen Description BLOOD RIGHT HAND  Final   Special Requests   Final    BOTTLES DRAWN AEROBIC AND ANAEROBIC Blood Culture adequate volume   Culture   Final    NO GROWTH 1 DAY Performed at [facility], [place] [address] [place] [address], [place],  [address]    Report Status PENDING  Incomplete    Radiology Reports Ct Abdomen Pelvis Wo Contrast  Result Date: 02/11/2018 CLINICAL DATA:  Abdominal pain. Vomiting. Difficulty with diabetes. EXAM: CT ABDOMEN AND PELVIS WITHOUT CONTRAST TECHNIQUE: Multidetector CT imaging of the abdomen and pelvis was performed following  the standard protocol without IV contrast. COMPARISON:  07/28/2016 FINDINGS: Lower chest: Small bilateral pleural effusions with basilar atelectasis. Improved since previous study. Small esophageal hiatal hernia. Coronary artery calcifications. Hepatobiliary: No focal liver abnormality is seen. No gallstones, gallbladder wall thickening, or biliary dilatation. Pancreas: Unremarkable. No pancreatic ductal dilatation or surrounding inflammatory changes. Spleen: Normal in size without focal abnormality. Adrenals/Urinary Tract: No adrenal gland nodules. Bilateral renal atrophy. No hydronephrosis or hydroureter. No renal, ureteral, or bladder stones. Bladder wall is diffusely thickened with small amount of gas in the bladder. This could represent cystitis. Stomach/Bowel: Stomach, small bowel, and colon are not abnormally distended. Stool fills the colon. Small bowel are decompressed. No wall thickening is identified although under distention limits evaluation of bowel wall. Appendix is not identified. Vascular/Lymphatic: Aortic atherosclerosis. No enlarged abdominal or pelvic lymph nodes. Reproductive: Uterus and bilateral adnexa are unremarkable. Other: There is a moderate-sized infraumbilical abdominal wall hernia at the midline containing fat. No free air or free fluid in the abdomen. Small right inguinal hernia containing fat. Musculoskeletal: No acute or significant osseous findings. IMPRESSION: 1. Small bilateral pleural effusions with basilar atelectasis. Small esophageal hiatal hernia. Bilateral renal atrophy. Bladder wall thickening with small amount of gas in the bladder. This could represent cystitis. 2. Moderate-sized infraumbilical abdominal wall hernia containing fat. Small right inguinal hernia containing fat. Electronically Signed   By: Lucienne Capers M.D.   On: 02/11/2018 04:48   Ct Head Wo Contrast  Result Date: 02/14/2018 CLINICAL DATA:  Confusion EXAM: CT HEAD WITHOUT CONTRAST TECHNIQUE:  Contiguous axial images were obtained from the base of the skull through the vertex without intravenous contrast. COMPARISON:  08/03/2016 FINDINGS: Brain: Mild atrophic changes are noted. Scattered areas  of decreased attenuation are noted consistent with prior ischemia particularly in the right cerebellar lobe, basal ganglia bilaterally as well as the right posterior parietal lobe. These areas are stable in appearance from the prior exam. No new hemorrhage, infarct or space-occupying mass lesion is seen. Vascular: No hyperdense vessel or unexpected calcification. Skull: Normal. Negative for fracture or focal lesion. Sinuses/Orbits: Stable punctate densities are again seen in the right globe. Postsurgical changes in the left lobe are noted. Paranasal sinuses are within normal limits. Other: None. IMPRESSION: Chronic changes similar to that seen on prior exam. No acute abnormality seen. Electronically Signed   By: Inez Catalina M.D.   On: 02/14/2018 22:49   Dg Chest Port 1 View  Result Date: 02/10/2018 CLINICAL DATA:  Vomiting EXAM: PORTABLE CHEST 1 VIEW COMPARISON:  11/14/2017 FINDINGS: Cardiac shadow is at the upper limits of normal in size. The lungs are hypoinflated with mild left basilar atelectasis. No bony abnormality is seen. Stent is again noted in the left cephalic vein. IMPRESSION: Left basilar atelectasis. Electronically Signed   By: Inez Catalina M.D.   On: 02/10/2018 23:34   [place] Lt Upper Extrem Ltd Soft Tissue Non Vascular  Result Date: 02/13/2018 CLINICAL DATA:  Swelling about left upper extremity fistula. EXAM: ULTRASOUND LEFT UPPER EXTREMITY LIMITED TECHNIQUE: Ultrasound examination of the upper extremity soft tissues was performed in the area of clinical concern. COMPARISON:  Fistulogram 12/12/2017. 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 No fluid collection or mass about the fistula is  seen. No acute abnormality is identified. IMPRESSION: Negative for fluid collection or mass.  No acute finding. 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 Electronically Signed   By: Inge Rise M.D.   On: 02/13/2018 10:16     Phillips Climes M.D on 02/15/2018 at 11:12 AM  Between 7am to 7pm - Pager - [phone]  After 7pm go to [email or web address] - password [email or web address]  [facility] -  Office  [phone]

## 2018-02-15 NOTE — Progress Notes (Signed)
Consult to Spiritual Care for Advanced Directive Education acknowledged.  Chaplain is grateful for the collaboration and briefing from Pt.'s attending nurse.  Pt's son and caregiver are present at bedside.   Spanish is the language preferred by Patient and support system.   It seems clear that the Pt. Has an advocate in place that is making decisions that reflect good care.   Given the Susanne Greenhouse altered status I do not believe that she is competent to consent to the  Tenants of an Advanced Directive / Living Will.

## 2018-02-15 NOTE — Progress Notes (Signed)
CRITICAL VALUE ALERT  Critical Value:  Troponin 0.07  Date & Time Notied:  02/15/18 at 1405  Provider Notified: Elgergawy  Orders Received/Actions taken: Awaiting response.

## 2018-02-15 NOTE — Progress Notes (Signed)
INFECTIOUS DISEASE PROGRESS NOTE  ID: Sarah Kelley is a 67 y.o. female with  Principal Problem:   Hyperglycemia Active Problems:   Essential hypertension   Abdominal pain   ESRD (end stage renal disease) (HCC)   Altered mental status   Nausea & vomiting   AVF (arteriovenous fistula) (HCC)   MSSA (methicillin susceptible Staphylococcus aureus) infection  Subjective: Patient reports diffuse abdominal pain and central chest pain. She reports that the pain began on admission. Denies SOB, changes in bowel movement).   Abtx:  Anti-infectives (From admission, onward)   Start     Dose/Rate Route Frequency Ordered Stop   02/14/18 1200  ceFAZolin (ANCEF) IVPB 2g/100 mL premix     2 g 200 mL/hr over 30 Minutes Intravenous Every T-Th-Sa (Hemodialysis) 02/12/18 1504     02/12/18 1700  ceFAZolin (ANCEF) IVPB 2g/100 mL premix     2 g 200 mL/hr over 30 Minutes Intravenous  Once 02/12/18 1504 02/12/18 1836   02/12/18 0000  cefTRIAXone (ROCEPHIN) 1 g in sodium chloride 0.9 % 100 mL IVPB  Status:  Discontinued     1 g 200 mL/hr over 30 Minutes Intravenous Every 24 hours 02/11/18 0144 02/11/18 1749   02/12/18 0000  cefTRIAXone (ROCEPHIN) 2 g in sodium chloride 0.9 % 100 mL IVPB  Status:  Discontinued     2 g 200 mL/hr over 30 Minutes Intravenous Every 24 hours 02/11/18 1749 02/12/18 1504   02/11/18 0030  cefTRIAXone (ROCEPHIN) 1 g in sodium chloride 0.9 % 100 mL IVPB     1 g 200 mL/hr over 30 Minutes Intravenous  Once 02/11/18 0026 02/11/18 0150      Medications: I have reviewed the patient's current medications.  Objective: Vital signs in last 24 hours: Temp:  [98 F (36.7 C)-100.7 F (38.2 C)] 99 F (37.2 C) (08/16 0814) Pulse Rate:  [85-94] 87 (08/16 0503) Resp:  [14-19] 14 (08/16 0503) BP: (98-145)/(39-77) 145/77 (08/16 0503) SpO2:  [92 %-96 %] 92 % (08/16 0503) Weight:  [66.5 kg] 66.5 kg (08/16 0506)   Constitutional: Resting comfortably in bed. She is able to  answer all questions without confusion. Seems more alert compared to yesterday. HENT:  Mouth/Throat: Nooropharyngeal exudate. Multiple missing teeth. Cardiovascular:Normal rateand regular rhythm.No chest wall tenderness to palpation.  Pulmonary/Chest:Effort normaland breath sounds normal. Norespiratory distress.  Abdominal:Soft.Bowel sounds are normal. She exhibitsno distension. No tenderness to palpation of abdomen in all 4 quadrants.  Musculoskeletal:Normal range of motion.LU AVF + bruit/thrill Neurological: Alert Skin: Skin iswarmand dry.  Psychiatric: Cannot assess mood and affect due to confusion  Lab Results Recent Labs    02/14/18 0450 02/15/18 0527  WBC 7.3 6.4  HGB 10.5* 11.3*  HCT 32.8* 34.7*  NA 133* 139  K 3.8 4.0  CL 92* 99  CO2 24 27  BUN 61* 24*  CREATININE 7.45* 4.55*   Liver Panel No results for input(s): PROT, ALBUMIN, AST, ALT, ALKPHOS, BILITOT, BILIDIR, IBILI in the last 72 hours. Sedimentation Rate No results for input(s): ESRSEDRATE in the last 72 hours. C-Reactive Protein No results for input(s): CRP in the last 72 hours.  Microbiology: Recent Results (from the past 240 hour(s))  Blood culture (routine x 2)     Status: None (Preliminary result)   Collection Time: 02/10/18 10:42 PM  Result Value Ref Range Status   Specimen Description BLOOD RIGHT UPPER ARM  Final   Special Requests   Final    BOTTLES DRAWN AEROBIC ONLY Blood Culture results  may not be optimal due to an inadequate volume of blood received in culture bottles   Culture   Final    NO GROWTH 3 DAYS Performed at [facility], [place] [address], [place], [place] [address]    Report Status PENDING  Incomplete  Blood culture (routine x 2)     Status: Abnormal   Collection Time: 02/10/18 11:06 PM  Result Value Ref Range Status   Specimen Description BLOOD RIGHT ANTECUBITAL  Final   Special Requests   Final    BOTTLES DRAWN AEROBIC AND ANAEROBIC Blood Culture  adequate volume   Culture  Setup Time   Final    GRAM POSITIVE COCCI IN CLUSTERS AEROBIC BOTTLE ONLY CRITICAL RESULT CALLED TO, READ BACK BY AND VERIFIED WITH: B MANCHERIL PHARMD 02/11/18 1705 JDW Performed at [facility], [place] [address], [place], [place] [address]    Culture STAPHYLOCOCCUS AUREUS (A)  Final   Report Status 02/15/2018 FINAL  Final   Organism ID, Bacteria STAPHYLOCOCCUS AUREUS  Final      Susceptibility   Staphylococcus aureus - MIC*    CIPROFLOXACIN <=0.5 SENSITIVE Sensitive     ERYTHROMYCIN <=0.25 SENSITIVE Sensitive     GENTAMICIN <=0.5 SENSITIVE Sensitive     OXACILLIN 0.5 SENSITIVE Sensitive     TETRACYCLINE <=1 SENSITIVE Sensitive     VANCOMYCIN 1 SENSITIVE Sensitive     TRIMETH/SULFA <=10 SENSITIVE Sensitive     CLINDAMYCIN <=0.25 SENSITIVE Sensitive     RIFAMPIN <=0.5 SENSITIVE Sensitive     Inducible Clindamycin NEGATIVE Sensitive     * STAPHYLOCOCCUS AUREUS  Blood Culture ID Panel (Reflexed)     Status: Abnormal   Collection Time: 02/10/18 11:06 PM  Result Value Ref Range Status   Enterococcus species NOT DETECTED NOT DETECTED Final   Listeria monocytogenes NOT DETECTED NOT DETECTED Final   Staphylococcus species DETECTED (A) NOT DETECTED Final    Comment: CRITICAL RESULT CALLED TO, READ BACK BY AND VERIFIED WITH: B MANCHERIL PHARMD 02/11/18 JDW    Staphylococcus aureus DETECTED (A) NOT DETECTED Final    Comment: Methicillin (oxacillin) susceptible Staphylococcus aureus (MSSA). Preferred therapy is anti staphylococcal beta lactam antibiotic (Cefazolin or Nafcillin), unless clinically contraindicated. CRITICAL RESULT CALLED TO, READ BACK BY AND VERIFIED WITH: B MANCHERIL PHARMD 02/11/18 1705 JDW    Methicillin resistance NOT DETECTED NOT DETECTED Final   Streptococcus species NOT DETECTED NOT DETECTED Final   Streptococcus agalactiae NOT DETECTED NOT DETECTED Final   Streptococcus pneumoniae NOT DETECTED NOT DETECTED Final   Streptococcus pyogenes  NOT DETECTED NOT DETECTED Final   Acinetobacter baumannii NOT DETECTED NOT DETECTED Final   Enterobacteriaceae species NOT DETECTED NOT DETECTED Final   Enterobacter cloacae complex NOT DETECTED NOT DETECTED Final   Escherichia coli NOT DETECTED NOT DETECTED Final   Klebsiella oxytoca NOT DETECTED NOT DETECTED Final   Klebsiella pneumoniae NOT DETECTED NOT DETECTED Final   Proteus species NOT DETECTED NOT DETECTED Final   Serratia marcescens NOT DETECTED NOT DETECTED Final   Haemophilus influenzae NOT DETECTED NOT DETECTED Final   Neisseria meningitidis NOT DETECTED NOT DETECTED Final   Pseudomonas aeruginosa NOT DETECTED NOT DETECTED Final   Candida albicans NOT DETECTED NOT DETECTED Final   Candida glabrata NOT DETECTED NOT DETECTED Final   Candida krusei NOT DETECTED NOT DETECTED Final   Candida parapsilosis NOT DETECTED NOT DETECTED Final   Candida tropicalis NOT DETECTED NOT DETECTED Final    Comment: Performed at [facility], [address] [place], [place] [address]  Urine culture     Status: None   Collection Time: 02/10/18 11:42 PM  Result Value Ref Range Status   Specimen Description URINE, CATHETERIZED  Final   Special Requests NONE  Final   Culture   Final    NO GROWTH Performed at [facility], [address] [place] [address] [place], [place] [address]    Report Status 02/12/2018 FINAL  Final  MRSA PCR Screening     Status: None   Collection Time: 02/11/18  2:43 AM  Result Value Ref Range Status   MRSA by PCR NEGATIVE NEGATIVE Final    Comment:        The GeneXpert MRSA Assay (FDA approved for NASAL specimens only), is one component of a comprehensive MRSA colonization surveillance program. It is not intended to diagnose MRSA infection nor to guide or monitor treatment for MRSA infections. Performed at [facility], [place] [address] [place], [place] [address]   Culture, blood (Routine X 2) w Reflex to ID Panel     Status: None (Preliminary result)    Collection Time: 02/13/18  4:04 AM  Result Value Ref Range Status   Specimen Description BLOOD RIGHT HAND  Final   Special Requests   Final    BOTTLES DRAWN AEROBIC AND ANAEROBIC Blood Culture adequate volume   Culture   Final    NO GROWTH 1 DAY Performed at [facility], [place] [address] [place], [place] [address]    Report Status PENDING  Incomplete    Studies/Results: Ct Head Wo Contrast  Result Date: 02/14/2018 CLINICAL DATA:  Confusion EXAM: CT HEAD WITHOUT CONTRAST TECHNIQUE: Contiguous axial images were obtained from the base of the skull through the vertex without intravenous contrast. COMPARISON:  08/03/2016 FINDINGS: Brain: Mild atrophic changes are noted. Scattered areas of decreased attenuation are noted consistent with prior ischemia particularly in the right cerebellar lobe, basal ganglia bilaterally as well as the right posterior parietal lobe. These areas are stable in appearance from the prior exam. No new hemorrhage, infarct or space-occupying mass lesion is seen. Vascular: No hyperdense vessel or unexpected calcification. Skull: Normal. Negative for fracture or focal lesion. Sinuses/Orbits: Stable punctate densities are again seen in the right globe. Postsurgical changes in the left lobe are noted. Paranasal sinuses are within normal limits. Other: None. IMPRESSION: Chronic changes similar to that seen on prior exam. No acute abnormality seen. Electronically Signed   By: Inez Catalina M.D.   On: 02/14/2018 22:49     Assessment/Plan: 1. MSSA Bacteremia - Culture 8/11 grew STAPHYLOCOCCUS AUREUS, BCID: MSSA detected. Repeat blood culture: NGTD x 1 day - Urine Culture: no growth - Patient has remained afebrile - WBC within normal limits (6.4 today, down from 7.3 8/1) - 8/14 AVF [place]: Negative for fluid collection or mass. No acute finding. 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 - TTE: Normal EF (55-60%) with grade 1 diastolic dysfunction. - 8/15 Head CT: No acute abnormalities  - Total days of antibiotics: Day 4 Cefazolin - Continue Cephazolin x 4 weeks  2. Confusion - Patient does seem more confused today and is unable to answer my questions. Encephalopathy is likely multifactorial in nature (electrolyte abnormalities 2/2 ESRD and sepsis most likely 2/2 AVF). Patient's most recent labs from today shows hyponatremia (133), azotemia (61), and Creatinine (7.45 increased from 5.70 yesterday). Patient is currently in dialysis  which should alleviate her electrolyte abnormalities.   3. Chest Pain - Positive Tropinin x3 0.51-0.69 8/12 - EKG 8/12: RBBB; no acute ischemic changes. - Notified primary team. Will defer work-up to them.          Carroll Sage MD

## 2018-02-15 NOTE — Progress Notes (Addendum)
[facility] Progress Note   Subjective:  Seen in room, resting quietly. Woke for exam and questioning, seems confused again now - unclear if just sleepy. No CP/dyspnea.  Objective Vitals:   02/15/18 0004 02/15/18 0503 02/15/18 0506 02/15/18 0814  BP: (!) 109/49 (!) 145/77    Pulse: 86 87    Resp: 15 14    Temp:  98.3 F (36.8 C)  99 F (37.2 C)  TempSrc:  Oral    SpO2: 94% 92%    Weight:   66.5 kg   Height:       Physical Exam General: Resting quietly, NAD. Not requiring nasal oxygen. Heart: RRR; no murmur Lungs: CTA anteriorly Abdomen: soft, non-tender Extremities: No LE edema Dialysis Access: L AVF + bruit  Additional Objective Labs: Basic Metabolic Panel: Recent Labs  Lab 02/12/18 0418 02/13/18 0402 02/14/18 0450 02/15/18 0527  NA 138 133* 133* 139  K 5.1 4.2 3.8 4.0  CL 95* 92* 92* 99  CO2 27 25 24 27   GLUCOSE 104* 269* 53* 109*  BUN 64* 41* 61* 24*  CREATININE 8.75* 5.70* 7.45* 4.55*  CALCIUM 9.3 8.6* 8.6* 8.8*  PHOS 5.9*  --   --   --    Liver Function Tests: Recent Labs  Lab 02/10/18 2106 02/11/18 0940 02/12/18 0418  AST 24 24  --   ALT 18 17  --   ALKPHOS 137* 106  --   BILITOT 0.8 0.7  --   PROT 6.9 6.5  --   ALBUMIN 3.3* 2.8* 2.5*   Recent Labs  Lab 02/10/18 2106 02/15/18 0527  LIPASE 49 27   CBC: Recent Labs  Lab 02/11/18 0733 02/12/18 0418 02/13/18 0402 02/14/18 0450 02/15/18 0527  WBC 18.0* 15.6* 10.1 7.3 6.4  HGB 11.4* 11.3* 10.8* 10.5* 11.3*  HCT 34.1* 35.6* 34.6* 32.8* 34.7*  MCV 95.5 100.6* 101.2* 100.3* 97.5  PLT 117* 120* 134* 139* PENDING   Blood Culture    Component Value Date/Time   SDES BLOOD RIGHT HAND 02/13/2018 0404   SPECREQUEST  02/13/2018 0404    BOTTLES DRAWN AEROBIC AND ANAEROBIC Blood Culture adequate volume   CULT  02/13/2018 0404    NO GROWTH 1 DAY Performed at [facility], [place] [address], [place], [place] [address]    REPTSTATUS PENDING 02/13/2018 0404    Cardiac  Enzymes: Recent Labs  Lab 02/11/18 0228 02/11/18 0940 02/11/18 1418  TROPONINI 0.51* 0.69* 0.56*   CBG: Recent Labs  Lab 02/14/18 1918 02/14/18 2108 02/15/18 0004 02/15/18 0503 02/15/18 0813  GLUCAP 132* 131* 100* 103* 109*   Studies/Results: Ct Head Wo Contrast  Result Date: 02/14/2018 CLINICAL DATA:  Confusion EXAM: CT HEAD WITHOUT CONTRAST TECHNIQUE: Contiguous axial images were obtained from the base of the skull through the vertex without intravenous contrast. COMPARISON:  08/03/2016 FINDINGS: Brain: Mild atrophic changes are noted. Scattered areas of decreased attenuation are noted consistent with prior ischemia particularly in the right cerebellar lobe, basal ganglia bilaterally as well as the right posterior parietal lobe. These areas are stable in appearance from the prior exam. No new hemorrhage, infarct or space-occupying mass lesion is seen. Vascular: No hyperdense vessel or unexpected calcification. Skull: Normal. Negative for fracture or focal lesion. Sinuses/Orbits: Stable punctate densities are again seen in the right globe. Postsurgical changes in the left lobe are noted. Paranasal sinuses are within normal limits. Other: None. IMPRESSION: Chronic changes similar to that seen on prior exam. No acute abnormality seen. Electronically Signed  By: Inez Catalina M.D.   On: 02/14/2018 22:49   Medications: .  ceFAZolin (ANCEF) IV 2 g (02/14/18 1233)   . atorvastatin  40 mg Oral Daily  . Chlorhexidine Gluconate Cloth  6 each Topical Q0600  . DULoxetine  60 mg Oral Daily  . feeding supplement (PRO-STAT SUGAR FREE 64)  30 mL Oral BID  . gabapentin  300 mg Oral QHS  . heparin  5,000 Units Subcutaneous Q8H  . insulin aspart  0-15 Units Subcutaneous TID WC  . insulin aspart  0-5 Units Subcutaneous QHS  . irbesartan  150 mg Oral Daily  . loratadine  10 mg Oral Daily  . multivitamin  1 tablet Oral QHS  . sevelamer carbonate  800 mg Oral TID WC   Dialysis Orders: TTS at  [facility] 4 hrs 180 NRe 400/800 66.5 kg 2K/2.25Ca UFP 2 LUA AVF -Heparin 2000 units IV TIW -Hectorol 2 mcg IV TIW  Assessment/Plan: 1. MSSA bacteremia: ID following. On IV Cefazolin. 2. DKA (on admit, resolved): Per primary. 3. AMS: Was improving, now ?worse again. Head CT 8/15 negative. Following. 4. ESRD: Continue per TTS schedule, next 8/17. 5. Hypertension/volume: Getting close to EDW. 6. Anemia: Hgb 11.3, no ESA for now. 7. Metabolic bone disease - Ca 8.8. Continue binders, VDRA. 8. Nutrition -Albumin 2.5. Has been advanced to soft diet.  prostat and renal vit. 9. DM-as noted above-per primary 10. Hx dementia/depression-per primary  Veneta Penton, Hershal Coria 02/15/2018, 9:31 AM  [facility] Pager: [phone]  Renal Attending: I agree with note as articulated above. Erling Cruz, MD

## 2018-02-16 DIAGNOSIS — R1013 Epigastric pain: Secondary | ICD-10-CM

## 2018-02-16 LAB — CBC
HCT: 31.7 % — ABNORMAL LOW (ref 36.0–46.0)
Hemoglobin: 10 g/dL — ABNORMAL LOW (ref 12.0–15.0)
MCH: 32.1 pg (ref 26.0–34.0)
MCHC: 31.5 g/dL (ref 30.0–36.0)
MCV: 101.6 fL — ABNORMAL HIGH (ref 78.0–100.0)
Platelets: 178 10*3/uL (ref 150–400)
RBC: 3.12 MIL/uL — ABNORMAL LOW (ref 3.87–5.11)
RDW: 14.4 % (ref 11.5–15.5)
WBC: 6.4 10*3/uL (ref 4.0–10.5)

## 2018-02-16 LAB — GLUCOSE, CAPILLARY
GLUCOSE-CAPILLARY: 177 mg/dL — AB (ref 70–99)
Glucose-Capillary: 149 mg/dL — ABNORMAL HIGH (ref 70–99)
Glucose-Capillary: 120 mg/dL — ABNORMAL HIGH (ref 70–99)
Glucose-Capillary: 144 mg/dL — ABNORMAL HIGH (ref 70–99)
Glucose-Capillary: 178 mg/dL — ABNORMAL HIGH (ref 70–99)

## 2018-02-16 LAB — BASIC METABOLIC PANEL
Anion gap: 14 (ref 5–15)
BUN: 46 mg/dL — ABNORMAL HIGH (ref 8–23)
CO2: 26 mmol/L (ref 22–32)
Calcium: 8.7 mg/dL — ABNORMAL LOW (ref 8.9–10.3)
Chloride: 95 mmol/L — ABNORMAL LOW (ref 98–111)
Creatinine, Ser: 6.84 mg/dL — ABNORMAL HIGH (ref 0.44–1.00)
GFR calc Af Amer: 6 mL/min — ABNORMAL LOW (ref >60)
GFR calc non Af Amer: 6 mL/min — ABNORMAL LOW (ref >60)
Glucose, Bld: 147 mg/dL — ABNORMAL HIGH (ref 70–99)
Potassium: 4.2 mmol/L (ref 3.5–5.1)
Sodium: 135 mmol/L (ref 135–145)

## 2018-02-16 LAB — CULTURE, BLOOD (ROUTINE X 2): Culture: NO GROWTH

## 2018-02-16 NOTE — Progress Notes (Signed)
PROGRESS NOTE                                                                                                                                                                                                             Patient Demographics:    Sarah Kelley, is a 67 y.o. female, DOB - December 30, 1950, GMW:[id number]  Admit date - 02/10/2018   Admitting Physician Jani Gravel, MD  Outpatient Primary MD for the patient is [name], MD  LOS - 5   Chief Complaint  Patient presents with  . Altered Mental Status  . Hypoglycemia       Brief Narrative   67 y.o.female,w hypertension, hyperlipidemia, Dm2, ESRD on HD (T, T, S), presents with weakness and vomiting last Sunday, as well she was confused, she was noted to have fever 101.4, leukocytosis of 13 point 1K, he receives hemodialysis via long-standing aVF and left upper extremity, her work-up significant for MSSA bacteremia.   Subjective:    [name] today better appetite yesterday with good dinner, but she does remain confused .   Principal Problem:   Hyperglycemia Active Problems:   Essential hypertension   Abdominal pain   ESRD (end stage renal disease) (HCC)   Altered mental status   Nausea & vomiting   AVF (arteriovenous fistula) (HCC)   MSSA (methicillin susceptible Staphylococcus aureus) infection  MSSA bacteremia -End-stage renal disease patient, on hemodialysis, she is receiving dialysis via left upper extremity aVF, there is no indwelling lines. 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 -Abdomen pelvis significant only for  cystitis.  Encephalopathy -Initially somnolent, but did not improve, did have some significant delirium during hospital stay, CT head with no acute findings, appears to be significantly improved today.   Uncontrolled DM2 -This post insulin drip initially, dizziness or sliding scale, given her current hypoglycemia, poor oral intake, Lantus has been stopped, so far CBGs are acceptable on sliding scale alone .  ESRD: on HD TTS - seen by renal service, on hemodialysis TTS  Hypertension - cont home Diovan 160mg  po qday, hold if BP <  125  Diabetic neuropathy - Cont cymbalta 60mg  po qday - Cont Neurontin lower to 300 mg po qhs  Hyperlipidmeia - Cont Lipitor 40mg  po qhs  Chest pain  -Typical, EKG nonacute, her troponin non-ACS pattern, she had some chest pain yesterday where her troponin been 0.05 which is lower than her baseline .  Abdominal pain  -Seems to be chronic problem, she had CT abdomen pelvis twice during hospital stay, with no acute findings .  patient with some epigastric pain today, repeat lipase in a.m., CT abdomen pelvis on admission with no acute findings, will continue to monitor closely  Code Status : Full  Family Communication  : Daughter and son at bedside  Disposition Plan  : Home tomorrow  Consults  :  ID, renal  Procedures  : none  DVT Prophylaxis  :  [place] heparin  Lab Results  Component Value Date   PLT 178 02/16/2018    Antibiotics  :    Anti-infectives (From admission, onward)   Start     Dose/Rate Route Frequency Ordered Stop   02/14/18 1200  ceFAZolin (ANCEF) IVPB 2g/100 mL premix     2 g 200 mL/hr over 30 Minutes Intravenous Every T-Th-Sa (Hemodialysis) 02/12/18 1504 03/13/18 2359   02/12/18 1700  ceFAZolin (ANCEF) IVPB 2g/100 mL premix     2 g 200 mL/hr over 30 Minutes Intravenous  Once 02/12/18 1504 02/12/18 1836   02/12/18 0000  cefTRIAXone (ROCEPHIN) 1 g in sodium chloride 0.9 % 100 mL IVPB  Status:  Discontinued     1 g 200  mL/hr over 30 Minutes Intravenous Every 24 hours 02/11/18 0144 02/11/18 1749   02/12/18 0000  cefTRIAXone (ROCEPHIN) 2 g in sodium chloride 0.9 % 100 mL IVPB  Status:  Discontinued     2 g 200 mL/hr over 30 Minutes Intravenous Every 24 hours 02/11/18 1749 02/12/18 1504   02/11/18 0030  cefTRIAXone (ROCEPHIN) 1 g in sodium chloride 0.9 % 100 mL IVPB     1 g 200 mL/hr over 30 Minutes Intravenous  Once 02/11/18 0026 02/11/18 0150        Objective:   Vitals:   02/16/18 1200 02/16/18 1215 02/16/18 1339 02/16/18 1407  BP: (!) 90/44 (!) 109/43 117/67   Pulse: 83 84 90   Resp:  18 16   Temp:  98 F (36.7 C) 99.1 F (37.3 C)   TempSrc:  Oral    SpO2:   95%   Weight:    66.6 kg  Height:        Wt Readings from Last 3 Encounters:  02/16/18 66.6 kg  12/31/17 68 kg  11/14/17 72.6 kg     Intake/Output Summary (Last 24 hours) at 02/16/2018 1628 Last data filed at 02/16/2018 1402 Gross per 24 hour  Intake 260 ml  Output 1402 ml  Net -1142 ml     Physical Exam  She is more awake and alert today, conversant, pleasant, but remains confused. CTA B/L , no wheezing RRR,No Gallops,Rubs or new Murmurs, No Parasternal Heave Soft, BS+, no rebound No Cyanosis, Clubbing or edema, No new Rash or bruise      Data Review:    CBC Recent Labs  Lab 02/12/18 0418 02/13/18 0402 02/14/18 0450 02/15/18 0527 02/16/18 0442  WBC 15.6* 10.1 7.3 6.4 6.4  HGB 11.3* 10.8* 10.5* 11.3* 10.0*  HCT 35.6* 34.6* 32.8* 34.7* 31.7*  PLT 120* 134* 139* 113* 178  MCV 100.6* 101.2* 100.3* 97.5 101.6*  MCH 31.9  31.6 32.1 31.7 32.1  MCHC 31.7 31.2 32.0 32.6 31.5  RDW 14.6 14.6 14.2 14.4 14.4    Chemistries  Recent Labs  Lab 02/10/18 2106  02/11/18 0940  02/12/18 0418 02/13/18 0402 02/14/18 0450 02/15/18 0527 02/16/18 0442  NA 130*  --  138   < > 138 133* 133* 139 135  K 5.3*  --  5.1   < > 5.1 4.2 3.8 4.0 4.2  CL 88*  --  99   < > 95* 92* 92* 99 95*  CO2 23  --  21*   < > 27 25 24 27 26    GLUCOSE 749*  --  162*   < > 104* 269* 53* 109* 147*  BUN 49*  --  56*   < > 64* 41* 61* 24* 46*  CREATININE 6.45*   < > 7.33*   < > 8.75* 5.70* 7.45* 4.55* 6.84*  CALCIUM 9.3  --  9.3   < > 9.3 8.6* 8.6* 8.8* 8.7*  AST 24  --  24  --   --   --   --   --   --   ALT 18  --  17  --   --   --   --   --   --   ALKPHOS 137*  --  106  --   --   --   --   --   --   BILITOT 0.8  --  0.7  --   --   --   --   --   --    < > = values in this interval not displayed.   ------------------------------------------------------------------------------------------------------------------ No results for input(s): CHOL, HDL, LDLCALC, TRIG, CHOLHDL, LDLDIRECT in the last 72 hours.  Lab Results  Component Value Date   HGBA1C 8.2 07/25/2017   ------------------------------------------------------------------------------------------------------------------ No results for input(s): TSH, T4TOTAL, T3FREE, THYROIDAB in the last 72 hours.  Invalid input(s): FREET3 ------------------------------------------------------------------------------------------------------------------ No results for input(s): VITAMINB12, FOLATE, FERRITIN, TIBC, IRON, RETICCTPCT in the last 72 hours.  Coagulation profile No results for input(s): INR, PROTIME in the last 168 hours.  No results for input(s): DDIMER in the last 72 hours.  Cardiac Enzymes Recent Labs  Lab 02/11/18 0940 02/11/18 1418 02/15/18 1149  TROPONINI 0.69* 0.56* 0.07*   ------------------------------------------------------------------------------------------------------------------ No results found for: BNP  Inpatient Medications  Scheduled Meds: . atorvastatin  40 mg Oral Daily  . Chlorhexidine Gluconate Cloth  6 each Topical Q0600  . DULoxetine  60 mg Oral Daily  . feeding supplement (PRO-STAT SUGAR FREE 64)  30 mL Oral BID  . gabapentin  300 mg Oral QHS  . heparin  5,000 Units Subcutaneous Q8H  . insulin aspart  0-15 Units Subcutaneous TID WC  .  insulin aspart  0-5 Units Subcutaneous QHS  . irbesartan  150 mg Oral Daily  . loratadine  10 mg Oral Daily  . multivitamin  1 tablet Oral QHS  . sevelamer carbonate  800 mg Oral TID WC   Continuous Infusions: .  ceFAZolin (ANCEF) IV 2 g (02/16/18 1358)   PRN Meds:.acetaminophen **OR** acetaminophen, ondansetron (ZOFRAN) IV  Micro Results Recent Results (from the past 240 hour(s))  Blood culture (routine x 2)     Status: None   Collection Time: 02/10/18 10:42 PM  Result Value Ref Range Status   Specimen Description BLOOD RIGHT UPPER ARM  Final   Special Requests   Final    BOTTLES DRAWN AEROBIC ONLY Blood Culture results may  not be optimal due to an inadequate volume of blood received in culture bottles   Culture   Final    NO GROWTH 5 DAYS Performed at [facility], [place] [address] [place], [place] [address]    Report Status 02/16/2018 FINAL  Final  Blood culture (routine x 2)     Status: Abnormal   Collection Time: 02/10/18 11:06 PM  Result Value Ref Range Status   Specimen Description BLOOD RIGHT ANTECUBITAL  Final   Special Requests   Final    BOTTLES DRAWN AEROBIC AND ANAEROBIC Blood Culture adequate volume   Culture  Setup Time   Final    GRAM POSITIVE COCCI IN CLUSTERS AEROBIC BOTTLE ONLY CRITICAL RESULT CALLED TO, READ BACK BY AND VERIFIED WITH: B MANCHERIL PHARMD 02/11/18 1705 JDW Performed at [facility], [place] [address], [place], [place] [address]    Culture STAPHYLOCOCCUS AUREUS (A)  Final   Report Status 02/15/2018 FINAL  Final   Organism ID, Bacteria STAPHYLOCOCCUS AUREUS  Final      Susceptibility   Staphylococcus aureus - MIC*    CIPROFLOXACIN <=0.5 SENSITIVE Sensitive     ERYTHROMYCIN <=0.25 SENSITIVE Sensitive     GENTAMICIN <=0.5 SENSITIVE Sensitive     OXACILLIN 0.5 SENSITIVE Sensitive     TETRACYCLINE <=1 SENSITIVE Sensitive     VANCOMYCIN 1 SENSITIVE Sensitive     TRIMETH/SULFA <=10 SENSITIVE Sensitive     CLINDAMYCIN <=0.25 SENSITIVE  Sensitive     RIFAMPIN <=0.5 SENSITIVE Sensitive     Inducible Clindamycin NEGATIVE Sensitive     * STAPHYLOCOCCUS AUREUS  Blood Culture ID Panel (Reflexed)     Status: Abnormal   Collection Time: 02/10/18 11:06 PM  Result Value Ref Range Status   Enterococcus species NOT DETECTED NOT DETECTED Final   Listeria monocytogenes NOT DETECTED NOT DETECTED Final   Staphylococcus species DETECTED (A) NOT DETECTED Final    Comment: CRITICAL RESULT CALLED TO, READ BACK BY AND VERIFIED WITH: B MANCHERIL PHARMD 02/11/18 JDW    Staphylococcus aureus DETECTED (A) NOT DETECTED Final    Comment: Methicillin (oxacillin) susceptible Staphylococcus aureus (MSSA). Preferred therapy is anti staphylococcal beta lactam antibiotic (Cefazolin or Nafcillin), unless clinically contraindicated. CRITICAL RESULT CALLED TO, READ BACK BY AND VERIFIED WITH: B MANCHERIL PHARMD 02/11/18 1705 JDW    Methicillin resistance NOT DETECTED NOT DETECTED Final   Streptococcus species NOT DETECTED NOT DETECTED Final   Streptococcus agalactiae NOT DETECTED NOT DETECTED Final   Streptococcus pneumoniae NOT DETECTED NOT DETECTED Final   Streptococcus pyogenes NOT DETECTED NOT DETECTED Final   Acinetobacter baumannii NOT DETECTED NOT DETECTED Final   Enterobacteriaceae species NOT DETECTED NOT DETECTED Final   Enterobacter cloacae complex NOT DETECTED NOT DETECTED Final   Escherichia coli NOT DETECTED NOT DETECTED Final   Klebsiella oxytoca NOT DETECTED NOT DETECTED Final   Klebsiella pneumoniae NOT DETECTED NOT DETECTED Final   Proteus species NOT DETECTED NOT DETECTED Final   Serratia marcescens NOT DETECTED NOT DETECTED Final   Haemophilus influenzae NOT DETECTED NOT DETECTED Final   Neisseria meningitidis NOT DETECTED NOT DETECTED Final   Pseudomonas aeruginosa NOT DETECTED NOT DETECTED Final   Candida albicans NOT DETECTED NOT DETECTED Final   Candida glabrata NOT DETECTED NOT DETECTED Final   Candida krusei NOT DETECTED NOT  DETECTED Final   Candida parapsilosis NOT DETECTED NOT DETECTED Final   Candida tropicalis NOT DETECTED NOT DETECTED Final    Comment: Performed at [facility], [address] [place], [place] [address]  Urine culture     Status: None   Collection Time: 02/10/18 11:42 PM  Result Value Ref Range Status   Specimen Description URINE, CATHETERIZED  Final   Special Requests NONE  Final   Culture   Final    NO GROWTH Performed at [facility], [address], [place], [place] [address]    Report Status 02/12/2018 FINAL  Final  MRSA PCR Screening     Status: None   Collection Time: 02/11/18  2:43 AM  Result Value Ref Range Status   MRSA by PCR NEGATIVE NEGATIVE Final    Comment:        The GeneXpert MRSA Assay (FDA approved for NASAL specimens only), is one component of a comprehensive MRSA colonization surveillance program. It is not intended to diagnose MRSA infection nor to guide or monitor treatment for MRSA infections. Performed at [facility], [place] [address] [place],  [address]   Culture, blood (Routine X 2) w Reflex to ID Panel     Status: None (Preliminary result)   Collection Time: 02/13/18  4:04 AM  Result Value Ref Range Status   Specimen Description BLOOD RIGHT HAND  Final   Special Requests   Final    BOTTLES DRAWN AEROBIC AND ANAEROBIC Blood Culture adequate volume   Culture   Final    NO GROWTH 3 DAYS Performed at [facility], [place] [address] [place],  [address]    Report Status PENDING  Incomplete    Radiology Reports Ct Abdomen Pelvis Wo Contrast  Result Date: 02/11/2018 CLINICAL DATA:  Abdominal pain. Vomiting. Difficulty with diabetes. EXAM: CT ABDOMEN AND PELVIS WITHOUT CONTRAST TECHNIQUE: Multidetector CT imaging of the abdomen and pelvis was performed following the standard protocol without IV contrast. COMPARISON:  07/28/2016 FINDINGS: Lower chest: Small bilateral pleural effusions with basilar atelectasis.  Improved since previous study. Small esophageal hiatal hernia. Coronary artery calcifications. Hepatobiliary: No focal liver abnormality is seen. No gallstones, gallbladder wall thickening, or biliary dilatation. Pancreas: Unremarkable. No pancreatic ductal dilatation or surrounding inflammatory changes. Spleen: Normal in size without focal abnormality. Adrenals/Urinary Tract: No adrenal gland nodules. Bilateral renal atrophy. No hydronephrosis or hydroureter. No renal, ureteral, or bladder stones. Bladder wall is diffusely thickened with small amount of gas in the bladder. This could represent cystitis. Stomach/Bowel: Stomach, small bowel, and colon are not abnormally distended. Stool fills the colon. Small bowel are decompressed. No wall thickening is identified although under distention limits evaluation of bowel wall. Appendix is not identified. Vascular/Lymphatic: Aortic atherosclerosis. No enlarged abdominal or pelvic lymph nodes. Reproductive: Uterus and bilateral adnexa are unremarkable. Other: There is a moderate-sized infraumbilical abdominal wall hernia at the midline containing fat. No free air or free fluid in the abdomen. Small right inguinal hernia containing fat. Musculoskeletal: No acute or significant osseous findings. IMPRESSION: 1. Small bilateral pleural effusions with basilar atelectasis. Small esophageal hiatal hernia. Bilateral renal atrophy. Bladder wall thickening with small amount of gas in the bladder. This could represent cystitis. 2. Moderate-sized infraumbilical abdominal wall hernia containing fat. Small right inguinal hernia containing fat. Electronically Signed   By: Lucienne Capers M.D.   On: 02/11/2018 04:48   Ct Head Wo Contrast  Result Date: 02/14/2018 CLINICAL DATA:  Confusion EXAM: CT HEAD WITHOUT CONTRAST TECHNIQUE: Contiguous axial images were obtained from the base of the skull through the vertex without intravenous contrast. COMPARISON:  08/03/2016 FINDINGS: Brain:  Mild atrophic changes are  noted. Scattered areas of decreased attenuation are noted consistent with prior ischemia particularly in the right cerebellar lobe, basal ganglia bilaterally as well as the right posterior parietal lobe. These areas are stable in appearance from the prior exam. No new hemorrhage, infarct or space-occupying mass lesion is seen. Vascular: No hyperdense vessel or unexpected calcification. Skull: Normal. Negative for fracture or focal lesion. Sinuses/Orbits: Stable punctate densities are again seen in the right globe. Postsurgical changes in the left lobe are noted. Paranasal sinuses are within normal limits. Other: None. IMPRESSION: Chronic changes similar to that seen on prior exam. No acute abnormality seen. Electronically Signed   By: Inez Catalina M.D.   On: 02/14/2018 22:49   Ct Abdomen Pelvis W Contrast  Result Date: 02/15/2018 CLINICAL DATA:  Abdominal pain with vomiting EXAM: CT ABDOMEN AND PELVIS WITH CONTRAST TECHNIQUE: Multidetector CT imaging of the abdomen and pelvis was performed using the standard protocol following bolus administration of intravenous contrast. CONTRAST:  14mL OMNIPAQUE IOHEXOL 300 MG/ML  SOLN COMPARISON:  CT 02/11/2018, 07/28/2016 FINDINGS: Lower chest: Lung bases demonstrate small slightly loculated left pleural effusion. Left greater than right basilar consolidation, atelectasis versus pneumonia. Mild cardiomegaly. Hepatobiliary: Contracted gallbladder. No focal hepatic abnormality or biliary dilatation Pancreas: Unremarkable. No pancreatic ductal dilatation or surrounding inflammatory changes. Spleen: Normal in size without focal abnormality. Adrenals/Urinary Tract: Adrenal glands are within normal limits. Atrophic kidneys without hydronephrosis. Thick-walled appearance of bladder with mild surrounding soft tissue stranding. Stomach/Bowel: Stomach is within normal limits. Appendix appears normal. No evidence of bowel wall thickening, distention, or  inflammatory changes. Vascular/Lymphatic: Moderate to marked aortic atherosclerosis. No aneurysmal dilatation. No significantly enlarged lymph nodes. Reproductive: Calcified uterine vessels.  No adnexal mass. Other: Negative for free air or free fluid. Fat containing infraumbilical ventral hernia. Musculoskeletal: No acute or significant osseous findings. IMPRESSION: 1. Small left-sided pleural effusion with minimal loculation of fluid. Partial atelectasis versus pneumonia in the left greater than right lung base. 2. Thick-walled appearance of the urinary bladder with mild urothelial enhancement, possible cystitis. 3. Atrophic kidneys. Electronically Signed   By: Donavan Foil M.D.   On: 02/15/2018 22:05   Dg Chest Port 1 View  Result Date: 02/10/2018 CLINICAL DATA:  Vomiting EXAM: PORTABLE CHEST 1 VIEW COMPARISON:  11/14/2017 FINDINGS: Cardiac shadow is at the upper limits of normal in size. The lungs are hypoinflated with mild left basilar atelectasis. No bony abnormality is seen. Stent is again noted in the left cephalic vein. IMPRESSION: Left basilar atelectasis. Electronically Signed   By: Inez Catalina M.D.   On: 02/10/2018 23:34   [place] Lt Upper Extrem Ltd Soft Tissue Non Vascular  Result Date: 02/13/2018 CLINICAL DATA:  Swelling about left upper extremity fistula. EXAM: ULTRASOUND LEFT UPPER EXTREMITY LIMITED TECHNIQUE: Ultrasound examination of the upper extremity soft tissues was performed in the area of clinical concern. COMPARISON:  Fistulogram 12/12/2017. 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 No fluid collection or mass about the fistula is seen. No acute abnormality is identified. IMPRESSION: Negative for fluid collection or mass.  No acute finding. 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  Electronically Signed   By: Inge Rise M.D.   On: 02/13/2018 10:16     Phillips Climes M.D on 02/16/2018 at 4:28 PM  Between 7am to 7pm - Pager - [phone]  After 7pm go  to [email or web address] - password [facility]  [facility] -  Office  [phone]

## 2018-02-16 NOTE — Procedures (Signed)
Tolerating HD, not very responsive verbally but awake Hemodynamically stable BFR 400 Soft BP Goal 2000cc Erling Cruz, MD

## 2018-02-16 NOTE — Progress Notes (Signed)
Patient back from HD, alert, able to follows commends. No acute distress noted, no complaints. Family at bedside. Will continue to monitor.

## 2018-02-17 DIAGNOSIS — A4901 Methicillin susceptible Staphylococcus aureus infection, unspecified site: Secondary | ICD-10-CM

## 2018-02-17 DIAGNOSIS — R739 Hyperglycemia, unspecified: Secondary | ICD-10-CM

## 2018-02-17 LAB — GLUCOSE, CAPILLARY
Glucose-Capillary: 207 mg/dL — ABNORMAL HIGH (ref 70–99)
Glucose-Capillary: 191 mg/dL — ABNORMAL HIGH (ref 70–99)
Glucose-Capillary: 242 mg/dL — ABNORMAL HIGH (ref 70–99)

## 2018-02-17 MED ORDER — PRO-STAT SUGAR FREE PO LIQD: 30.0000 mL | Freq: Two times a day (BID) | ORAL | 0 refills | Status: DC

## 2018-02-17 MED ORDER — GABAPENTIN 300 MG PO CAPS: 300.0000 mg | ORAL_CAPSULE | Freq: Every day | ORAL | 5 refills | Status: DC

## 2018-02-17 MED ORDER — INSULIN GLARGINE 100 UNIT/ML ~~LOC~~ SOLN
10.0000 [IU] | Freq: Two times a day (BID) | SUBCUTANEOUS | 5 refills | Status: DC
Start: 2018-02-17 — End: 2018-03-27

## 2018-02-17 MED ORDER — CEFAZOLIN SODIUM-DEXTROSE 2-4 GM/100ML-% IV SOLN: 2.0000 g | INTRAVENOUS | Status: AC

## 2018-02-17 NOTE — Care Management Note (Signed)
Case Management Note  Patient Details  Name: Sarah Kelley MRN: [id number] Date of Birth: 1951-04-21  Subjective/Objective:        Pt presented from home with family for weakness and confusion.   Pt uninsured with PCP.  Pt speaks only [place].  Family speaks [place].  Pt has walker at home.  Action/Plan: [facility] to provide [facility] PT under charity care.  Pt's family advised.  Pt to d/c today.  Expected Discharge Date:                  Expected Discharge Plan:  [place]  In-House Referral:  NA  Discharge planning Services  CM Consult  Post Acute Care Choice:  Home Health Choice offered to:  NA(Charity care through [facility] only)  DME Arranged:  N/A DME Agency:     HH Arranged:  PT [place] Agency:  [place]  Status of Service:  Completed, signed off  If discussed at [place] of Stay Meetings, dates discussed:    Additional Comments:  Claudie Leach, RN 02/17/2018, 1:41 PM

## 2018-02-17 NOTE — Progress Notes (Addendum)
[facility] Progress Note   Subjective: Sitting up in bed eating, family interpreting for pt. She feels well but still has LLQ pain. Remains oriented to self only but this is baseline. Family says they are not worried about confusion-this is normal for patient. They are concerned about ongoing abdominal pain.   Objective Vitals:   02/16/18 1407 02/16/18 2031 02/17/18 0528 02/17/18 0639  BP:  (!) 147/61 (!) 150/50   Pulse:  94 85   Resp:  16 16   Temp:  97.8 F (36.6 C) 98.9 F (37.2 C)   TempSrc:  Oral Oral   SpO2:  93% 94%   Weight: 66.6 kg   66.5 kg  Height:       Physical Exam General: Pleasant elderly female in NAD Heart: RRR Lungs: CTAB A/P Abdomen: S, active BS. Guarding LLQ Extremities: No LE edema Dialysis Access: L AVF + T/B  Additional Objective Labs: Basic Metabolic Panel: Recent Labs  Lab 02/12/18 0418  02/14/18 0450 02/15/18 0527 02/16/18 0442  NA 138   < > 133* 139 135  K 5.1   < > 3.8 4.0 4.2  CL 95*   < > 92* 99 95*  CO2 27   < > 24 27 26   GLUCOSE 104*   < > 53* 109* 147*  BUN 64*   < > 61* 24* 46*  CREATININE 8.75*   < > 7.45* 4.55* 6.84*  CALCIUM 9.3   < > 8.6* 8.8* 8.7*  PHOS 5.9*  --   --   --   --    < > = values in this interval not displayed.   Liver Function Tests: Recent Labs  Lab 02/10/18 2106 02/11/18 0940 02/12/18 0418  AST 24 24  --   ALT 18 17  --   ALKPHOS 137* 106  --   BILITOT 0.8 0.7  --   PROT 6.9 6.5  --   ALBUMIN 3.3* 2.8* 2.5*   Recent Labs  Lab 02/10/18 2106 02/15/18 0527  LIPASE 49 27   CBC: Recent Labs  Lab 02/12/18 0418 02/13/18 0402 02/14/18 0450 02/15/18 0527 02/16/18 0442  WBC 15.6* 10.1 7.3 6.4 6.4  HGB 11.3* 10.8* 10.5* 11.3* 10.0*  HCT 35.6* 34.6* 32.8* 34.7* 31.7*  MCV 100.6* 101.2* 100.3* 97.5 101.6*  PLT 120* 134* 139* 113* 178   Blood Culture    Component Value Date/Time   SDES BLOOD RIGHT HAND 02/13/2018 0404   SPECREQUEST  02/13/2018 0404    BOTTLES DRAWN AEROBIC  AND ANAEROBIC Blood Culture adequate volume   CULT  02/13/2018 0404    NO GROWTH 3 DAYS Performed at [facility], [place] [address] [place], [place] [address]    REPTSTATUS PENDING 02/13/2018 0404    Cardiac Enzymes: Recent Labs  Lab 02/11/18 0228 02/11/18 0940 02/11/18 1418 02/15/18 1149  TROPONINI 0.51* 0.69* 0.56* 0.07*   CBG: Recent Labs  Lab 02/16/18 1335 02/16/18 1731 02/16/18 2029 02/17/18 0112 02/17/18 0804  GLUCAP 144* 178* 177* 207* 191*   Iron Studies: No results for input(s): IRON, TIBC, TRANSFERRIN, FERRITIN in the last 72 hours. @lablastinr3 @ Studies/Results: Ct Abdomen Pelvis W Contrast  Result Date: 02/15/2018 CLINICAL DATA:  Abdominal pain with vomiting EXAM: CT ABDOMEN AND PELVIS WITH CONTRAST TECHNIQUE: Multidetector CT imaging of the abdomen and pelvis was performed using the standard protocol following bolus administration of intravenous contrast. CONTRAST:  127mL OMNIPAQUE IOHEXOL 300 MG/ML  SOLN COMPARISON:  CT 02/11/2018, 07/28/2016 FINDINGS: Lower chest:  Lung bases demonstrate small slightly loculated left pleural effusion. Left greater than right basilar consolidation, atelectasis versus pneumonia. Mild cardiomegaly. Hepatobiliary: Contracted gallbladder. No focal hepatic abnormality or biliary dilatation Pancreas: Unremarkable. No pancreatic ductal dilatation or surrounding inflammatory changes. Spleen: Normal in size without focal abnormality. Adrenals/Urinary Tract: Adrenal glands are within normal limits. Atrophic kidneys without hydronephrosis. Thick-walled appearance of bladder with mild surrounding soft tissue stranding. Stomach/Bowel: Stomach is within normal limits. Appendix appears normal. No evidence of bowel wall thickening, distention, or inflammatory changes. Vascular/Lymphatic: Moderate to marked aortic atherosclerosis. No aneurysmal dilatation. No significantly enlarged lymph nodes. Reproductive: Calcified uterine vessels.  No adnexal  mass. Other: Negative for free air or free fluid. Fat containing infraumbilical ventral hernia. Musculoskeletal: No acute or significant osseous findings. IMPRESSION: 1. Small left-sided pleural effusion with minimal loculation of fluid. Partial atelectasis versus pneumonia in the left greater than right lung base. 2. Thick-walled appearance of the urinary bladder with mild urothelial enhancement, possible cystitis. 3. Atrophic kidneys. Electronically Signed   By: Donavan Foil M.D.   On: 02/15/2018 22:05   Medications: .  ceFAZolin (ANCEF) IV 2 g (02/16/18 1358)   . atorvastatin  40 mg Oral Daily  . Chlorhexidine Gluconate Cloth  6 each Topical Q0600  . DULoxetine  60 mg Oral Daily  . feeding supplement (PRO-STAT SUGAR FREE 64)  30 mL Oral BID  . gabapentin  300 mg Oral QHS  . heparin  5,000 Units Subcutaneous Q8H  . insulin aspart  0-15 Units Subcutaneous TID WC  . insulin aspart  0-5 Units Subcutaneous QHS  . irbesartan  150 mg Oral Daily  . loratadine  10 mg Oral Daily  . multivitamin  1 tablet Oral QHS  . sevelamer carbonate  800 mg Oral TID WC     Dialysis Orders: TTS at [facility] 4 hrs 180 NRe 400/800 66.5 kg 2K/2.25Ca UFP 2 LUA AVF -Heparin 2000 units IV TIW -Hectorol 2 mcg IV TIW  Assessment/Plan: 1. MSSA bacteremia: ID following. On IV Cefazolin. Will need total of 28 days of ABX.  2. DKA (on admit, resolved): Per primary. 3. AMS: Was improving, now ?worse again. Head CT 8/15 negative. Following. 4. ESRD: Continue per TTS schedule, next 02/18/18. 5. Hypertension/volume: Getting close to EDW. 6. Anemia: Hgb 10. no ESA for now. 7. Metabolic bone disease -Ca 8.7.Continue binders,VDRA. 8. Nutrition -Albumin 2.5. Has been advanced to soft diet.prostat and renal vit. 9. DM-as noted above-per primary 10. Hx dementia/depression-per primary 11. 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   Rita H. Brown NP-C 02/17/2018, 11:13 AM  [facility] [phone]  Renal Attending: I agree with note above.  Antibiotics at [facility] to be arranged. Erling Cruz, MD

## 2018-02-17 NOTE — Discharge Summary (Signed)
Sarah Kelley, is a 67 y.o. female  DOB 12/27/1950  MRN [id number].  Admission date:  02/10/2018  Admitting Physician  Jani Gravel, MD  Discharge Date:  02/17/2018   Primary MD  [name], MD  Recommendations for primary care physician for things to follow:  -Patient to continue with IV cefazolin for total of 28 days, to continue through 03/12/2018  Admission Diagnosis  Hyperglycemia [R73.9] Acute cystitis without hematuria [N30.00] Altered mental status, unspecified altered mental status type [R41.82]   Discharge Diagnosis  Hyperglycemia [R73.9] Acute cystitis without hematuria [N30.00] Altered mental status, unspecified altered mental status type [R41.82]    Principal Problem:   Hyperglycemia Active Problems:   Essential hypertension   Abdominal pain   ESRD (end stage renal disease) (HCC)   Altered mental status   Nausea & vomiting   AVF (arteriovenous fistula) (HCC)   MSSA (methicillin susceptible Staphylococcus aureus) infection      Past Medical History:  Diagnosis Date  . Diabetes mellitus   . ESRD (end stage renal disease) ([place])   . Hyperlipidemia   . Hypertension   . Neuropathy    Diabetic  . Pneumonia 07/2016  . Renal disorder   . Sepsis ([place])   . Thyroid disease     Past Surgical History:  Procedure Laterality Date  . AMPUTATION Left 05/21/2015   Procedure: Left Foot 3rd Ray Amputation;  Surgeon: Newt Minion, MD;  Location: [facility];  Service: Orthopedics;  Laterality: Left;  . AV FISTULA PLACEMENT Left 03/28/2016   Procedure: ARTERIOVENOUS (AV) FISTULA CREATION LEFT ARM;  Surgeon: Waynetta Sandy, MD;  Location: [facility];  Service: Vascular;  Laterality: Left;  . DILATION AND CURETTAGE OF UTERUS    . FISTULA SUPERFICIALIZATION Left 09/04/2016   Procedure: FISTULA SUPERFICIALIZATION LEFT UPPER ARM;  Surgeon: Waynetta Sandy, MD;  Location: [facility];   Service: Vascular;  Laterality: Left;  . IR AV DIALY SHUNT INTRO NEEDLE/INTRAC INITIAL W/PTA/STENT/IMG LT Left 06/13/2017  . IR AV DIALY SHUNT INTRO NEEDLE/INTRAC INITIAL W/PTA/STENT/IMG LT Left 09/12/2017  . IR AV DIALY SHUNT INTRO NEEDLE/INTRACATH INITIAL W/PTA/IMG LEFT  11/10/2016  . IR AV DIALY SHUNT INTRO NEEDLE/INTRACATH INITIAL W/PTA/IMG LEFT  03/09/2017  . IR AV DIALY SHUNT INTRO NEEDLE/INTRACATH INITIAL W/PTA/IMG LEFT  12/12/2017  . IR GENERIC HISTORICAL  08/01/2016   IR FLUORO GUIDE CV LINE RIGHT 08/01/2016 [name], MD [facility]  . IR GENERIC HISTORICAL  08/01/2016   IR US GUIDE VASC ACCESS RIGHT 08/01/2016 [name], MD [facility]  . IR REMOVAL TUN CV CATH W/O FL  11/01/2016  . IR US GUIDE VASC ACCESS LEFT  11/10/2016  . IR US GUIDE VASC ACCESS LEFT  09/12/2017  . IR US GUIDE VASC ACCESS LEFT  12/12/2017       History of present illness and  Hospital Course:     Kindly see H&P for history of present illness and admission details, please review complete Labs, Consult reports and Test reports for all details in brief  HPI  from the history and physical done on the day of admission 02/11/2018 Sarah Kelley  is a 67 y.o. female, w hypertension, hyperlipidemia, Dm2, ESRD on HD (T, T, S), apparently c/o LLQ pain starting yesterday along with n/v but no bloody emesis. Pt family states that she had a waffle yesterday and then her sugar became unreadably high.  Pt denies fever, chills, cough, cp, palp, sob, diarrhea, brbpr, black stool, dysuria, hematuria.   In ED,  CXR IMPRESSION: Left basilar atelectasis.  Na 130, K 5.3, Bun 49, Creatinine 6.45 Ast 24, Alt 18 Alk phos 137, T. Bili 0.8 Lipase 49 Glucose 749  Wbc 13.1, Hgb 12.6, Plt 125  PH 7.344, pCo2 59.7, pO2 26  Trop 0.37 LA 2.43  Urinalysis wbc >50, Rbc 11-20  Pt will be admitted for n/v, abd pain LLQ, uti, and hyperglycemia. And troponin elevation   Hospital Course   67 y.o.female,w  hypertension, hyperlipidemia, Dm2, ESRD on HD (T, T, S), presents with weakness and vomiting last Sunday, as well she was confused, she was noted to have fever 101.4, leukocytosis of 13 point 1K, he receives hemodialysis via long-standing aVF and left upper extremity, her work-up significant for MSSA bacteremia, no source could be identified, total course was prolonged secondary to encephalopathy  MSSA bacteremia -End-stage renal disease patient, on hemodialysis, she is receiving dialysis via left upper extremity aVF, there is no indwelling lines. -ID input greatly appreciated, MSSA bacteremia on initial cultures 02/10/2018, surveillance cultures on 02/13/2018 so far remains negative, 2D echo with no significant valvular disease or source of infection, as well ultrasound of her left upper extremity AV fistula showing chronic 75% stenosis, recommendation for total 28 days of IV Ancef postdialysis.  Was discussed with renal Dr. Florene Glen, who will arrange for it on dialysis -CT abdomen pelvis significant only for cystitis  Acute metabolic encephalopathy -She does have some baseline dementia, initially somnolent, but it did improve, but she remains confused, with fluctuating mental status with some hospital delirium, CT head was obtained, with no acute findings, currently mental status has significantly improved, alert only to name, which family states it is around her baseline. -Ammonia within normal limits -Lowered Neurontin to 300 mg nightly   Uncontrolled DM2 -Required insulin drip initially, she is on Lantus 15 units daily, she had some hypoglycemia on this dose, especially in the setting of encephalopathy and poor oral intake, now she is more awake, appetite has improved, will discharge on Lantus 10 units subcu daily.  ESRD: on HD TTS - seen by renal service, on hemodialysis TTS  Abdominal pain  -This patient's main complaint on presentation, as well seems to be family's main concern, but  abdomen exam is benign, CT abdomen pelvis was obtained twice during hospital stay only significant for cystitis, and no  other reason to explain her pain .  seems to be chronic problem, she had CT abdomen pelvis twice during hospital stay, with no acute findings . Hypertension - cont home Diovan 178m po qday,   Diabetic neuropathy - Cont cymbalta 626mpo qday -Lowered Neurontin to 300 mg po qhs  Hyperlipidmeia -Cont Lipitor 4059mo qhs  Chest pain  - atypical, EKG nonacute, her troponin non-ACS pattern, she had some recurrent chest pain during hospital stay, EKG was nonacute, and repeat troponin has been 0.07 which is lower than her baseline actually.  Weakness, debility -Has seen by PT, recommendation for SNF, but patient with no insurance, so plan to discharge with home with home PT, [facility] will take care as a charity case   Discharge Condition:  Stable   Follow UP  Follow-up Information    [name], MD Follow up.   Specialty:  Family Medicine Contact information: [place] [address] [phone]        [facility] Follow [facility].   Specialty:  Home Health Services Why:  PHysical therapist will call you to schedule appointment. Contact information: [place] [address] [phone]             Discharge Instructions  and  Discharge Medications     Discharge Instructions    Increase activity slowly   Complete by:  As directed      Allergies as of 02/17/2018      Reactions   No Known Allergies       Medication List    STOP taking these medications   cephALEXin 500 MG capsule Commonly known as:  KEFLEX     TAKE these medications   atorvastatin 40 MG tablet Commonly known as:  LIPITOR Take 1 tablet (40 mg total) by mouth daily.   ceFAZolin 2-4 GM/100ML-% IVPB Commonly known as:  ANCEF Inject 100 mLs (2 g total) into the vein Every Tuesday,Thursday,and Saturday  with dialysis for 21 days. Please continue with dialysis TTS, through 03/12/2018 Start taking on:  02/19/2018   cetirizine 10 MG tablet Commonly known as:  ZYRTEC Take 10 mg by mouth daily.   DULoxetine 60 MG capsule Commonly known as:  CYMBALTA Take 1 capsule (60 mg total) by mouth daily.   feeding supplement (PRO-STAT SUGAR FREE 64) Liqd Take 30 mLs by mouth 2 (two) times daily.   gabapentin 300 MG capsule Commonly known as:  NEURONTIN Take 1 capsule (300 mg total) by mouth at bedtime. What changed:  how much to take   glucose blood test strip Use 3 times daily before meals   insulin glargine 100 UNIT/ML injection Commonly known as:  LANTUS Inject 0.1 mLs (10 Units total) into the skin 2 (two) times daily. What changed:  how much to take   Insulin Syringe-Needle U-100 31G X 15/64" 0.5 ML Misc 1 each by Does not apply route daily.   oxyCODONE-acetaminophen 5-325 MG tablet Commonly known as:  PERCOCET/ROXICET Take 1 tablet by mouth every 4 (four) hours as needed for moderate pain.   TRUE METRIX METER Devi 1 each by Does not apply route 3 (three) times daily before meals.   TRUEPLUS LANCETS 28G Misc 1 each by Does not apply route 3 (three) times daily before meals.   valsartan 160 MG tablet Commonly known as:  DIOVAN Take 1 tablet (160 mg total) by mouth daily.         Diet and Activity recommendation: See Discharge Instructions above   Consults obtained -  Renal  ID   Major procedures and Radiology Reports - PLEASE review detailed and final reports for all details, in brief -      Ct Abdomen Pelvis Wo Contrast  Result Date: 02/11/2018 CLINICAL DATA:  Abdominal pain. Vomiting. Difficulty with diabetes. EXAM: CT ABDOMEN AND PELVIS WITHOUT CONTRAST TECHNIQUE: Multidetector CT imaging of the abdomen and pelvis was performed following the standard protocol without IV contrast. COMPARISON:  07/28/2016 FINDINGS: Lower chest: Small bilateral pleural effusions  with basilar atelectasis. Improved since previous study. Small esophageal hiatal hernia. Coronary artery calcifications. Hepatobiliary: No focal liver abnormality is seen. No gallstones, gallbladder wall thickening, or biliary dilatation.  Pancreas: Unremarkable. No pancreatic ductal dilatation or surrounding inflammatory changes. Spleen: Normal in size without focal abnormality. Adrenals/Urinary Tract: No adrenal gland nodules. Bilateral renal atrophy. No hydronephrosis or hydroureter. No renal, ureteral, or bladder stones. Bladder wall is diffusely thickened with small amount of gas in the bladder. This could represent cystitis. Stomach/Bowel: Stomach, small bowel, and colon are not abnormally distended. Stool fills the colon. Small bowel are decompressed. No wall thickening is identified although under distention limits evaluation of bowel wall. Appendix is not identified. Vascular/Lymphatic: Aortic atherosclerosis. No enlarged abdominal or pelvic lymph nodes. Reproductive: Uterus and bilateral adnexa are unremarkable. Other: There is a moderate-sized infraumbilical abdominal wall hernia at the midline containing fat. No free air or free fluid in the abdomen. Small right inguinal hernia containing fat. Musculoskeletal: No acute or significant osseous findings. IMPRESSION: 1. Small bilateral pleural effusions with basilar atelectasis. Small esophageal hiatal hernia. Bilateral renal atrophy. Bladder wall thickening with small amount of gas in the bladder. This could represent cystitis. 2. Moderate-sized infraumbilical abdominal wall hernia containing fat. Small right inguinal hernia containing fat. Electronically Signed   By: Lucienne Capers M.D.   On: 02/11/2018 04:48   Ct Head Wo Contrast  Result Date: 02/14/2018 CLINICAL DATA:  Confusion EXAM: CT HEAD WITHOUT CONTRAST TECHNIQUE: Contiguous axial images were obtained from the base of the skull through the vertex without intravenous contrast. COMPARISON:   08/03/2016 FINDINGS: Brain: Mild atrophic changes are noted. Scattered areas of decreased attenuation are noted consistent with prior ischemia particularly in the right cerebellar lobe, basal ganglia bilaterally as well as the right posterior parietal lobe. These areas are stable in appearance from the prior exam. No new hemorrhage, infarct or space-occupying mass lesion is seen. Vascular: No hyperdense vessel or unexpected calcification. Skull: Normal. Negative for fracture or focal lesion. Sinuses/Orbits: Stable punctate densities are again seen in the right globe. Postsurgical changes in the left lobe are noted. Paranasal sinuses are within normal limits. Other: None. IMPRESSION: Chronic changes similar to that seen on prior exam. No acute abnormality seen. Electronically Signed   By: Inez Catalina M.D.   On: 02/14/2018 22:49   Ct Abdomen Pelvis W Contrast  Result Date: 02/15/2018 CLINICAL DATA:  Abdominal pain with vomiting EXAM: CT ABDOMEN AND PELVIS WITH CONTRAST TECHNIQUE: Multidetector CT imaging of the abdomen and pelvis was performed using the standard protocol following bolus administration of intravenous contrast. CONTRAST:  127m OMNIPAQUE IOHEXOL 300 MG/ML  SOLN COMPARISON:  CT 02/11/2018, 07/28/2016 FINDINGS: Lower chest: Lung bases demonstrate small slightly loculated left pleural effusion. Left greater than right basilar consolidation, atelectasis versus pneumonia. Mild cardiomegaly. Hepatobiliary: Contracted gallbladder. No focal hepatic abnormality or biliary dilatation Pancreas: Unremarkable. No pancreatic ductal dilatation or surrounding inflammatory changes. Spleen: Normal in size without focal abnormality. Adrenals/Urinary Tract: Adrenal glands are within normal limits. Atrophic kidneys without hydronephrosis. Thick-walled appearance of bladder with mild surrounding soft tissue stranding. Stomach/Bowel: Stomach is within normal limits. Appendix appears normal. No evidence of bowel wall  thickening, distention, or inflammatory changes. Vascular/Lymphatic: Moderate to marked aortic atherosclerosis. No aneurysmal dilatation. No significantly enlarged lymph nodes. Reproductive: Calcified uterine vessels.  No adnexal mass. Other: Negative for free air or free fluid. Fat containing infraumbilical ventral hernia. Musculoskeletal: No acute or significant osseous findings. IMPRESSION: 1. Small left-sided pleural effusion with minimal loculation of fluid. Partial atelectasis versus pneumonia in the left greater than right lung base. 2. Thick-walled appearance of the urinary bladder with mild urothelial enhancement, possible cystitis. 3. Atrophic kidneys. Electronically  Signed   By: Donavan Foil M.D.   On: 02/15/2018 22:05   Dg Chest Port 1 View  Result Date: 02/10/2018 CLINICAL DATA:  Vomiting EXAM: PORTABLE CHEST 1 VIEW COMPARISON:  11/14/2017 FINDINGS: Cardiac shadow is at the upper limits of normal in size. The lungs are hypoinflated with mild left basilar atelectasis. No bony abnormality is seen. Stent is again noted in the left cephalic vein. IMPRESSION: Left basilar atelectasis. Electronically Signed   By: Inez Catalina M.D.   On: 02/10/2018 23:34   [place] Lt Upper Extrem Ltd Soft Tissue Non Vascular  Result Date: 02/13/2018 CLINICAL DATA:  Swelling about left upper extremity fistula. EXAM: ULTRASOUND LEFT UPPER EXTREMITY LIMITED TECHNIQUE: Ultrasound examination of the upper extremity soft tissues was performed in the area of clinical concern. COMPARISON:  Fistulogram 12/12/2017. 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 No fluid collection or mass about the fistula is seen. No acute abnormality is identified. IMPRESSION: Negative for fluid collection or mass.  No acute finding. Mild aneurysmal dilatation of the patient's fistula is unchanged compared to the prior fistulogram and most  consistent with chronic use. Electronically Signed   By: Inge Rise M.D.   On: 02/13/2018 10:16    Micro Results     Recent Results (from the past 240 hour(s))  Blood culture (routine x 2)     Status: None   Collection Time: 02/10/18 10:42 PM  Result Value Ref Range Status   Specimen Description BLOOD RIGHT UPPER ARM  Final   Special Requests   Final    BOTTLES DRAWN AEROBIC ONLY Blood Culture results may not be optimal due to an inadequate volume of blood received in culture bottles   Culture   Final    NO GROWTH 5 DAYS Performed at [facility], [place] [address] [place], [place] [address]    Report Status 02/16/2018 FINAL  Final  Blood culture (routine x 2)     Status: Abnormal   Collection Time: 02/10/18 11:06 PM  Result Value Ref Range Status   Specimen Description BLOOD RIGHT ANTECUBITAL  Final   Special Requests   Final    BOTTLES DRAWN AEROBIC AND ANAEROBIC Blood Culture adequate volume   Culture  Setup Time   Final    GRAM POSITIVE COCCI IN CLUSTERS AEROBIC BOTTLE ONLY CRITICAL RESULT CALLED TO, READ BACK BY AND VERIFIED WITH: B MANCHERIL PHARMD 02/11/18 1705 JDW Performed at [facility], [place] [address] [place], [place] [address]    Culture STAPHYLOCOCCUS AUREUS (A)  Final   Report Status 02/15/2018 FINAL  Final   Organism ID, Bacteria STAPHYLOCOCCUS AUREUS  Final      Susceptibility   Staphylococcus aureus - MIC*    CIPROFLOXACIN <=0.5 SENSITIVE Sensitive     ERYTHROMYCIN <=0.25 SENSITIVE Sensitive     GENTAMICIN <=0.5 SENSITIVE Sensitive     OXACILLIN 0.5 SENSITIVE Sensitive     TETRACYCLINE <=1 SENSITIVE Sensitive     VANCOMYCIN 1 SENSITIVE Sensitive     TRIMETH/SULFA <=10 SENSITIVE Sensitive     CLINDAMYCIN <=0.25 SENSITIVE Sensitive     RIFAMPIN <=0.5 SENSITIVE Sensitive     Inducible Clindamycin NEGATIVE Sensitive     * STAPHYLOCOCCUS AUREUS  Blood Culture ID Panel (Reflexed)     Status: Abnormal   Collection Time: 02/10/18 11:06 PM    Result Value Ref Range Status   Enterococcus species NOT DETECTED NOT  DETECTED Final   Listeria monocytogenes NOT DETECTED NOT DETECTED Final   Staphylococcus species DETECTED (A) NOT DETECTED Final    Comment: CRITICAL RESULT CALLED TO, READ BACK BY AND VERIFIED WITH: B MANCHERIL PHARMD 02/11/18 JDW    Staphylococcus aureus DETECTED (A) NOT DETECTED Final    Comment: Methicillin (oxacillin) susceptible Staphylococcus aureus (MSSA). Preferred therapy is anti staphylococcal beta lactam antibiotic (Cefazolin or Nafcillin), unless clinically contraindicated. CRITICAL RESULT CALLED TO, READ BACK BY AND VERIFIED WITH: B MANCHERIL PHARMD 02/11/18 1705 JDW    Methicillin resistance NOT DETECTED NOT DETECTED Final   Streptococcus species NOT DETECTED NOT DETECTED Final   Streptococcus agalactiae NOT DETECTED NOT DETECTED Final   Streptococcus pneumoniae NOT DETECTED NOT DETECTED Final   Streptococcus pyogenes NOT DETECTED NOT DETECTED Final   Acinetobacter baumannii NOT DETECTED NOT DETECTED Final   Enterobacteriaceae species NOT DETECTED NOT DETECTED Final   Enterobacter cloacae complex NOT DETECTED NOT DETECTED Final   Escherichia coli NOT DETECTED NOT DETECTED Final   Klebsiella oxytoca NOT DETECTED NOT DETECTED Final   Klebsiella pneumoniae NOT DETECTED NOT DETECTED Final   Proteus species NOT DETECTED NOT DETECTED Final   Serratia marcescens NOT DETECTED NOT DETECTED Final   Haemophilus influenzae NOT DETECTED NOT DETECTED Final   Neisseria meningitidis NOT DETECTED NOT DETECTED Final   Pseudomonas aeruginosa NOT DETECTED NOT DETECTED Final   Candida albicans NOT DETECTED NOT DETECTED Final   Candida glabrata NOT DETECTED NOT DETECTED Final   Candida krusei NOT DETECTED NOT DETECTED Final   Candida parapsilosis NOT DETECTED NOT DETECTED Final   Candida tropicalis NOT DETECTED NOT DETECTED Final    Comment: Performed at [facility], [place] [address], [place], [place] [address]   Urine culture     Status: None   Collection Time: 02/10/18 11:42 PM  Result Value Ref Range Status   Specimen Description URINE, CATHETERIZED  Final   Special Requests NONE  Final   Culture   Final    NO GROWTH Performed at [facility], [address], [place], [place] [address]    Report Status 02/12/2018 FINAL  Final  MRSA PCR Screening     Status: None   Collection Time: 02/11/18  2:43 AM  Result Value Ref Range Status   MRSA by PCR NEGATIVE NEGATIVE Final    Comment:        The GeneXpert MRSA Assay (FDA approved for NASAL specimens only), is one component of a comprehensive MRSA colonization surveillance program. It is not intended to diagnose MRSA infection nor to guide or monitor treatment for MRSA infections. Performed at [facility], [place] [address], [place], [place] [address]   Culture, blood (Routine X 2) w Reflex to ID Panel     Status: None (Preliminary result)   Collection Time: 02/13/18  4:04 AM  Result Value Ref Range Status   Specimen Description BLOOD RIGHT HAND  Final   Special Requests   Final    BOTTLES DRAWN AEROBIC AND ANAEROBIC Blood Culture adequate volume   Culture   Final    NO GROWTH 3 DAYS Performed at [facility], [place] [address], [place],  [address]    Report Status PENDING  Incomplete       Today   Subjective:   Sarah Kelley today remains confused, but more awake, diet has significantly improved, she had a good night sleep, denies any chest pain, nausea or vomiting, still reports abdominal pain at baseline.  Objective:   Blood pressure (!) 150/50, pulse 85, temperature 98.9 F (37.2 C), temperature source Oral, resp. rate 16, height _0  (1.575 m), weight 66.5 kg, SpO2 94 %.   Intake/Output Summary (Last 24 hours) at 02/17/2018 1405 Last data filed at 02/17/2018 1316 Gross per 24 hour  Intake 450 ml  Output -  Net 450 ml    Exam Awake, sitting in recliner, pleasant, no apparent distress,  following command Good air entry bilaterally, clear to auscultation Regular rate and rhythm, no rubs murmurs gallops Abdomen soft, nondistended, bowel sounds present, she has minimal tenderness in left abdomen Extremities with no edema, clubbing or cyanosis  Data Review   CBC w Diff:  Lab Results  Component Value Date   WBC 6.4 02/16/2018   HGB 10.0 (L) 02/16/2018   HCT 31.7 (L) 02/16/2018   PLT 178 02/16/2018   LYMPHOPCT 7 12/03/2016   MONOPCT 4 12/03/2016   EOSPCT 1 12/03/2016   BASOPCT 0 12/03/2016    CMP:  Lab Results  Component Value Date   NA 135 02/16/2018   K 4.2 02/16/2018   CL 95 (L) 02/16/2018   CO2 26 02/16/2018   BUN 46 (H) 02/16/2018   CREATININE 6.84 (H) 02/16/2018   CREATININE 3.97 (H) 01/13/2016   PROT 6.5 02/11/2018   ALBUMIN 2.5 (L) 02/12/2018   BILITOT 0.7 02/11/2018   ALKPHOS 106 02/11/2018   AST 24 02/11/2018   ALT 17 02/11/2018  .   Total Time in preparing paper work, data evaluation and todays exam - 72 minutes  Phillips Climes M.D on 02/17/2018 at 2:05 PM  [facility]   Office  [phone]

## 2018-02-17 NOTE — Discharge Instructions (Signed)
Follow with Primary MD [name], MD in 7 days   Get CBC, CMP,  checked  by Primary MD next visit.    Activity: As tolerated with Full fall precautions use walker/cane & assistance as needed   Disposition Home    Diet: carbohydrate modified with modified diet 1200 cc fluid restriction per day, with feeding assistance and aspiration precautions.  On your next visit with your primary care physician please Get Medicines reviewed and adjusted.   Please request your Prim.MD to go over all Hospital Tests and Procedure/Radiological results at the follow up, please get all Hospital records sent to your Prim MD by signing hospital release before you go home.   If you experience worsening of your admission symptoms, develop shortness of breath, life threatening emergency, suicidal or homicidal thoughts you must seek medical attention immediately by calling 911 or calling your MD immediately  if symptoms less severe.  You Must read complete instructions/literature along with all the possible adverse reactions/side effects for all the Medicines you take and that have been prescribed to you. Take any new Medicines after you have completely understood and accpet all the possible adverse reactions/side effects.   Do not drive, operating heavy machinery, perform activities at heights, swimming or participation in water activities or provide baby sitting services if your were admitted for syncope or siezures until you have seen by Primary MD or a Neurologist and advised to do so again.  Do not drive when taking Pain medications.    Do not take more than prescribed Pain, Sleep and Anxiety Medications  Special Instructions: If you have smoked or chewed Tobacco  in the last 2 yrs please stop smoking, stop any regular Alcohol  and or any Recreational drug use.  Wear Seat belts while driving.   Please note  You were cared for by a hospitalist during your hospital stay. If you have any questions  about your discharge medications or the care you received while you were in the hospital after you are discharged, you can call the unit and asked to speak with the hospitalist on call if the hospitalist that took care of you is not available. Once you are discharged, your primary care physician will handle any further medical issues. Please note that NO REFILLS for any discharge medications will be authorized once you are discharged, as it is imperative that you return to your primary care physician (or establish a relationship with a primary care physician if you do not have one) for your aftercare needs so that they can reassess your need for medications and monitor your lab values.

## 2018-02-17 NOTE — Evaluation (Signed)
Physical Therapy Evaluation Patient Details Name: Sarah Kelley MRN: [id number] DOB: 1950-10-15 Today's Date: 02/17/2018   History of Present Illness  Pt is a 67 y.o. F with significant PMH of hypertension, hyperlipidemia, DM2, ESRD on HD, who presents with weakness, vomiting, and confusion. Workup significant for MSSA bacteremia.  Clinical Impression  Pt admitted with above diagnosis. Pt currently with functional limitations due to the deficits listed below (see PT Problem List). At baseline, patient walks with a walker and is dependent with ADL's. A&O to self only. On PT evaluation, patient requiring two person total assistance for transfer from bed to chair. Limited by fatigue and abdominal pain. Shows decreased initiation of movement. Pt will benefit from skilled PT to increase their independence and safety with mobility to allow discharge to the venue listed below.       Follow Up Recommendations SNF;Supervision/Assistance - 24 hour (unlikely to get due to Medicaid potential so HHPT via charity if possible)    Equipment Recommendations  Hospital bed;Other (comment)(hoyer lift)    Recommendations for Other Services OT consult     Precautions / Restrictions Precautions Precautions: Fall Restrictions Weight Bearing Restrictions: No      Mobility  Bed Mobility Overal bed mobility: Needs Assistance Bed Mobility: Supine to Sit     Supine to sit: Max assist;+2 for physical assistance     General bed mobility comments: able to minimally move BLE's to edge of bed  Transfers Overall transfer level: Needs assistance Equipment used: None Transfers: Squat Pivot Transfers     Squat pivot transfers: Total assist;+2 physical assistance     General transfer comment: Total assist +2 for low pivot to left from bed to chair, cueing patient to lean forward.   Ambulation/Gait                Stairs            Wheelchair Mobility    Modified Rankin (Stroke  Patients Only)       Balance Overall balance assessment: Needs assistance Sitting-balance support: Bilateral upper extremity supported;Feet supported Sitting balance-Leahy Scale: Poor Sitting balance - Comments: occasional right lateral lean requiring min-mod assist to correct   Standing balance support: Bilateral upper extremity supported Standing balance-Leahy Scale: Zero                               Pertinent Vitals/Pain Pain Assessment: Faces Faces Pain Scale: Hurts little more Pain Location: left abdomen Pain Descriptors / Indicators: Discomfort Pain Intervention(s): Limited activity within patient's tolerance;Monitored during session    Home Living Family/patient expects to be discharged to:: Private residence Living Arrangements: Children Available Help at Discharge: Family;Available 24 hours/day Type of Home: Mobile home Home Access: Ramped entrance     Home Layout: One level Home Equipment: [place] - 2 wheels;Cane - single point;Bedside commode;Shower seat;Wheelchair - manual      Prior Function Level of Independence: Needs assistance   Gait / Transfers Assistance Needed: uses walker to perform household ambulation  ADL's / Homemaking Assistance Needed: dependent with ADL's; "occasionally able to self feed," per children        Hand Dominance        Extremity/Trunk Assessment   Upper Extremity Assessment Upper Extremity Assessment: Generalized weakness    Lower Extremity Assessment Lower Extremity Assessment: Generalized weakness       Communication   Communication: Prefers language other than English((Spanish))  Cognition Arousal/Alertness: Awake/alert Behavior During Therapy: [facility]  for tasks assessed/performed Overall Cognitive Status: History of cognitive impairments - at baseline                                 General Comments: Family states pt is at baseline. A&O to self only, stating she is in [place]. Quiet,  withdrawn and slow to respond.      General Comments General comments (skin integrity, edema, etc.): Pt family present. [employer] interpreter #[id number] utilized    Exercises     Assessment/Plan    PT Assessment Patient needs continued PT services  PT Problem List Decreased strength;Decreased range of motion;Decreased activity tolerance;Decreased balance;Decreased mobility;Decreased cognition;Pain       PT Treatment Interventions Functional mobility training;Therapeutic activities;Therapeutic exercise;Balance training;Patient/family education    PT Goals (Current goals can be found in the Care Plan section)  Acute Rehab PT Goals Patient Stated Goal: none stated but pt family wants her walking again PT Goal Formulation: With patient/family Time For Goal Achievement: 03/03/18 Potential to Achieve Goals: Fair    Frequency Min 3X/week   Barriers to discharge        Co-evaluation               AM-PAC PT "6 Clicks" Daily Activity  Outcome Measure Difficulty turning over in bed (including adjusting bedclothes, sheets and blankets)?: Unable Difficulty moving from lying on back to sitting on the side of the bed? : Unable Difficulty sitting down on and standing up from a chair with arms (e.g., wheelchair, bedside commode, etc,.)?: Unable Help needed moving to and from a bed to chair (including a wheelchair)?: Total Help needed walking in hospital room?: Total Help needed climbing 3-5 steps with a railing? : Total 6 Click Score: 6    End of Session Equipment Utilized During Treatment: Gait belt Activity Tolerance: Patient limited by fatigue Patient left: in chair;with call bell/phone within reach;with family/visitor present Nurse Communication: Mobility status PT Visit Diagnosis: Other abnormalities of gait and mobility (R26.89);Muscle weakness (generalized) (M62.81);Difficulty in walking, not elsewhere classified (R26.2)    Time: 1028-1050 PT Time Calculation (min) (ACUTE  ONLY): 22 min   Charges:   PT Evaluation $PT Eval Moderate Complexity: 1 Mod          Ellamae Sia, PT, DPT Acute Rehabilitation Services  Pager: [phone]   Willy Eddy 02/17/2018, 12:30 PM

## 2018-02-17 NOTE — Progress Notes (Signed)
Patient was discharged home with home health by MD order; discharged instructions review and give to patient and her family with care notes; IV DIC; patient will be escorted to the car by nurse tech via wheelchair.

## 2018-02-18 LAB — GLUCOSE, CAPILLARY: Glucose-Capillary: 117 mg/dL — ABNORMAL HIGH (ref 70–99)

## 2018-02-18 LAB — CULTURE, BLOOD (ROUTINE X 2)
Culture: NO GROWTH
SPECIAL REQUESTS: ADEQUATE

## 2018-02-22 ENCOUNTER — Other Ambulatory Visit (HOSPITAL_COMMUNITY): Payer: Self-pay | Admitting: Interventional Radiology

## 2018-02-22 ENCOUNTER — Other Ambulatory Visit (HOSPITAL_COMMUNITY): Payer: Self-pay | Admitting: Nephrology

## 2018-02-22 DIAGNOSIS — N186 End stage renal disease: Secondary | ICD-10-CM

## 2018-02-26 ENCOUNTER — Other Ambulatory Visit: Payer: Self-pay | Admitting: Radiology

## 2018-02-26 MED ORDER — SODIUM CHLORIDE 0.9 % IV SOLN: INTRAVENOUS | Status: AC

## 2018-02-27 ENCOUNTER — Ambulatory Visit (HOSPITAL_COMMUNITY)
Admission: RE | Admit: 2018-02-27 | Discharge: 2018-02-27 | Disposition: A | Discharge status: Home / Self Care | Payer: Self-pay | Source: Ambulatory Visit | Attending: Nephrology | Admitting: Nephrology

## 2018-02-27 ENCOUNTER — Other Ambulatory Visit (HOSPITAL_COMMUNITY): Payer: Self-pay | Admitting: Nephrology

## 2018-02-27 ENCOUNTER — Encounter (HOSPITAL_COMMUNITY): Payer: Self-pay | Admitting: Interventional Radiology

## 2018-02-27 DIAGNOSIS — T82858A Stenosis of vascular prosthetic devices, implants and grafts, initial encounter: Secondary | ICD-10-CM | POA: Insufficient documentation

## 2018-02-27 DIAGNOSIS — N186 End stage renal disease: Secondary | ICD-10-CM

## 2018-02-27 DIAGNOSIS — Y832 Surgical operation with anastomosis, bypass or graft as the cause of abnormal reaction of the patient, or of later complication, without mention of misadventure at the time of the procedure: Secondary | ICD-10-CM | POA: Insufficient documentation

## 2018-02-27 HISTORY — PX: IR AV DIALY SHUNT INTRO NEEDLE/INTRACATH INITIAL W/PTA/IMG LEFT: IMG6103

## 2018-02-27 MED ORDER — LIDOCAINE HCL 1 % IJ SOLN
INTRAMUSCULAR | Status: DC | PRN
  Administered 2018-02-27: 5 mL

## 2018-02-27 MED ORDER — IOPAMIDOL (ISOVUE-300) INJECTION 61%
INTRAVENOUS | Status: AC
  Administered 2018-02-27: 45 mL
  Filled 2018-02-27: qty 100

## 2018-02-27 MED ORDER — LIDOCAINE HCL 1 % IJ SOLN
INTRAMUSCULAR | Status: AC
  Filled 2018-02-27: qty 20

## 2018-02-27 NOTE — Procedures (Signed)
LUE AV fistulagram and venous PTA 7 mm Success EBL 0 Comp 0

## 2018-02-28 MED FILL — ETHYL CHLORIDE AERO: 30 days supply | Qty: 104 | Fill #3

## 2018-03-01 ENCOUNTER — Other Ambulatory Visit: Payer: Self-pay | Admitting: Family Medicine

## 2018-03-01 DIAGNOSIS — E1149 Type 2 diabetes mellitus with other diabetic neurological complication: Secondary | ICD-10-CM

## 2018-03-06 ENCOUNTER — Other Ambulatory Visit: Payer: Self-pay | Admitting: Family Medicine

## 2018-03-06 DIAGNOSIS — E1149 Type 2 diabetes mellitus with other diabetic neurological complication: Secondary | ICD-10-CM

## 2018-03-07 MED FILL — DULoxetine HCL 60 MG CPEP: 60 | 30 days supply | Qty: 30 | Fill #0

## 2018-03-11 MED FILL — TRUEplus LANCETS 28G MISC: 30 days supply | Qty: 100 | Fill #1

## 2018-03-11 MED FILL — TRUE METRIX TEST STRIP: 30 days supply | Qty: 100 | Fill #2

## 2018-03-21 MED FILL — TRUEPLUS SYR 1ML 31GX5/16: 31G X 5/16" | 30 days supply | Qty: 100 | Fill #0

## 2018-03-21 MED FILL — TRUEPLUS SYR 1ML 31GX5/16": 31G X 5/16" | 30 days supply | Qty: 100 | Fill #0

## 2018-03-27 ENCOUNTER — Encounter: Payer: Self-pay | Admitting: Family Medicine

## 2018-03-27 ENCOUNTER — Ambulatory Visit: Payer: Self-pay | Attending: Family Medicine | Admitting: Family Medicine

## 2018-03-27 VITALS — BP 156/78 | HR 76 | Temp 97.3°F | Ht 60.0 in | Wt 107.2 lb

## 2018-03-27 DIAGNOSIS — E1149 Type 2 diabetes mellitus with other diabetic neurological complication: Secondary | ICD-10-CM

## 2018-03-27 DIAGNOSIS — Z89422 Acquired absence of other left toe(s): Secondary | ICD-10-CM | POA: Insufficient documentation

## 2018-03-27 DIAGNOSIS — Z992 Dependence on renal dialysis: Secondary | ICD-10-CM | POA: Insufficient documentation

## 2018-03-27 DIAGNOSIS — E079 Disorder of thyroid, unspecified: Secondary | ICD-10-CM | POA: Insufficient documentation

## 2018-03-27 DIAGNOSIS — E1140 Type 2 diabetes mellitus with diabetic neuropathy, unspecified: Secondary | ICD-10-CM | POA: Insufficient documentation

## 2018-03-27 DIAGNOSIS — I120 Hypertensive chronic kidney disease with stage 5 chronic kidney disease or end stage renal disease: Secondary | ICD-10-CM | POA: Insufficient documentation

## 2018-03-27 DIAGNOSIS — E119 Type 2 diabetes mellitus without complications: Secondary | ICD-10-CM

## 2018-03-27 DIAGNOSIS — N186 End stage renal disease: Secondary | ICD-10-CM

## 2018-03-27 DIAGNOSIS — E11319 Type 2 diabetes mellitus with unspecified diabetic retinopathy without macular edema: Secondary | ICD-10-CM | POA: Insufficient documentation

## 2018-03-27 DIAGNOSIS — I10 Essential (primary) hypertension: Secondary | ICD-10-CM

## 2018-03-27 DIAGNOSIS — E11621 Type 2 diabetes mellitus with foot ulcer: Secondary | ICD-10-CM | POA: Insufficient documentation

## 2018-03-27 DIAGNOSIS — L97421 Non-pressure chronic ulcer of left heel and midfoot limited to breakdown of skin: Secondary | ICD-10-CM

## 2018-03-27 DIAGNOSIS — E1122 Type 2 diabetes mellitus with diabetic chronic kidney disease: Secondary | ICD-10-CM | POA: Insufficient documentation

## 2018-03-27 DIAGNOSIS — Z794 Long term (current) use of insulin: Secondary | ICD-10-CM | POA: Insufficient documentation

## 2018-03-27 DIAGNOSIS — E7800 Pure hypercholesterolemia, unspecified: Secondary | ICD-10-CM

## 2018-03-27 DIAGNOSIS — Z79899 Other long term (current) drug therapy: Secondary | ICD-10-CM | POA: Insufficient documentation

## 2018-03-27 LAB — POCT GLYCOSYLATED HEMOGLOBIN (HGB A1C): HEMOGLOBIN A1C: 11.1 % — AB (ref 4.0–5.6)

## 2018-03-27 LAB — GLUCOSE, POCT (MANUAL RESULT ENTRY): POC Glucose: 366 mg/dl — AB (ref 70–99)

## 2018-03-27 MED ORDER — BACITRACIN 500 UNIT/GM EX OINT: 1.0000 "application " | TOPICAL_OINTMENT | Freq: Two times a day (BID) | CUTANEOUS | 1 refills | Status: DC

## 2018-03-27 MED ORDER — ATORVASTATIN CALCIUM 40 MG PO TABS: 40.0000 mg | ORAL_TABLET | Freq: Every day | ORAL | 5 refills | Status: DC

## 2018-03-27 MED ORDER — INSULIN GLARGINE 100 UNIT/ML ~~LOC~~ SOLN: SUBCUTANEOUS | 5 refills | Status: DC

## 2018-03-27 MED ORDER — DULOXETINE HCL 60 MG PO CPEP: 60.0000 mg | ORAL_CAPSULE | Freq: Every day | ORAL | 6 refills | Status: DC

## 2018-03-27 MED FILL — ATORVASTATIN CALCIUM 40 MG: 40 | 30 days supply | Qty: 30 | Fill #0

## 2018-03-27 MED FILL — $LANTUS 100 UNITS/ML VIAL: 100 | 84 days supply | Qty: 30 | Fill #0

## 2018-03-27 NOTE — Progress Notes (Signed)
Subjective:  Patient ID: Sarah Kelley, female    DOB: [birth year]  Age: 67 y.o. 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 She had a hospitalization at [facility] from 02/10/2018 through 2/69/4854 for metabolic encephalopathy and was found to have elevated sugars in the 700s at presentation; ammonia level was normal.  CT head revealed no acute abnormality.  Treated with IV insulin and IV fluids with improvement. She also had MSSA bacteremia, acute cystitis which was treated with IV cefazolin for 28 days. She was discharged on Lantus 10 units twice daily.  Today her daughter complaining the patient developed a left heel ulcer which started while she was hospitalized but denies discharge, drainage or pain at the site.  The patient spends a lot of time lying down on the bed.  Blood sugar in the clinic is 366 and reports from home indicate fasting sugars have been in the 150-170 and evening sugars in the 300-500 range. She has been compliant with Lantus. Tolerating dialysis okay and doing well on her antihypertensive, statin and gabapentin.  Past Medical History:  Diagnosis Date  . Diabetes mellitus   . ESRD (end stage renal disease) ([place])   . Hyperlipidemia   . Hypertension   . Neuropathy    Diabetic  . Pneumonia 07/2016  . Renal disorder   . Sepsis ([place])   . Thyroid disease     Past Surgical History:  Procedure Laterality Date  . AMPUTATION Left 05/21/2015   Procedure: Left Foot 3rd Ray Amputation;  Surgeon: Newt Minion, MD;  Location: [place];  Service: Orthopedics;  Laterality: Left;  . AV  FISTULA PLACEMENT Left 03/28/2016   Procedure: ARTERIOVENOUS (AV) FISTULA CREATION LEFT ARM;  Surgeon: Waynetta Sandy, MD;  Location: [place];  Service: Vascular;  Laterality: Left;  . DILATION AND CURETTAGE OF UTERUS    . FISTULA SUPERFICIALIZATION Left 09/04/2016   Procedure: FISTULA SUPERFICIALIZATION LEFT UPPER ARM;  Surgeon: Waynetta Sandy, MD;  Location: [place];  Service: Vascular;  Laterality: Left;  . IR AV DIALY SHUNT INTRO NEEDLE/INTRAC INITIAL W/PTA/STENT/IMG LT Left 06/13/2017  . IR AV DIALY SHUNT INTRO NEEDLE/INTRAC INITIAL W/PTA/STENT/IMG LT Left 09/12/2017  . IR AV DIALY SHUNT INTRO NEEDLE/INTRACATH INITIAL W/PTA/IMG LEFT  11/10/2016  . IR AV DIALY SHUNT INTRO NEEDLE/INTRACATH INITIAL W/PTA/IMG LEFT  03/09/2017  . IR AV DIALY SHUNT INTRO NEEDLE/INTRACATH INITIAL W/PTA/IMG LEFT  12/12/2017  . IR AV DIALY SHUNT INTRO NEEDLE/INTRACATH INITIAL W/PTA/IMG LEFT  02/27/2018  . IR GENERIC HISTORICAL  08/01/2016   IR FLUORO GUIDE CV LINE RIGHT 08/01/2016 [name], MD [facility]-INTERV RAD  . IR GENERIC HISTORICAL  08/01/2016   IR US GUIDE VASC ACCESS RIGHT 08/01/2016 [name], MD [facility]INTER[facility]  . IR REMOVAL TUN CV CATH W/O FL  11/01/2016  . IR US GUIDE VASC ACCESS LEFT  11/10/2016  . IR US GUIDE VASC ACCESS LEFT  09/12/2017  . IR US GUIDE VASC ACCESS LEFT  12/12/2017    Allergies  Allergen Reactions  . No Known Allergies      Outpatient Medications Prior to Visit  Medication Sig Dispense Refill  . Amino Acids-Protein Hydrolys (  FEEDING SUPPLEMENT, PRO-STAT SUGAR FREE 64,) LIQD Take 30 mLs by mouth 2 (two) times daily. 900 mL 0  . Blood Glucose Monitoring Suppl (TRUE METRIX METER) DEVI 1 each by Does not apply route 3 (three) times daily before meals. 1 Device 0  . cetirizine (ZYRTEC) 10 MG tablet Take 10 mg by mouth daily.    Marland Kitchen gabapentin (NEURONTIN) 300 MG capsule Take 1 capsule (300 mg total) by mouth at bedtime. 60 capsule 5  . glucose blood (TRUE METRIX BLOOD GLUCOSE TEST) test  strip Use 3 times daily before meals 100 each 12  . Insulin Syringe-Needle U-100 (BD INSULIN SYRINGE ULTRAFINE) 31G X 15/64" 0.5 ML MISC 1 each by Does not apply route daily. 30 each 5  . TRUEPLUS LANCETS 28G MISC 1 each by Does not apply route 3 (three) times daily before meals. 100 each 12  . valsartan (DIOVAN) 160 MG tablet Take 1 tablet (160 mg total) by mouth daily. 30 tablet 5  . atorvastatin (LIPITOR) 40 MG tablet Take 1 tablet (40 mg total) by mouth daily. 30 tablet 5  . DULoxetine (CYMBALTA) 60 MG capsule TAKE 1 CAPSULE (60 MG TOTAL) BY MOUTH DAILY. 30 capsule 0  . insulin glargine (LANTUS) 100 UNIT/ML injection Inject 0.1 mLs (10 Units total) into the skin 2 (two) times daily. 30 mL 5  . oxyCODONE-acetaminophen (PERCOCET) 5-325 MG tablet Take 1 tablet by mouth every 4 (four) hours as needed for moderate pain. (Patient not taking: Reported on 12/31/2017) 15 tablet 0   Facility-Administered Medications Prior to Visit  Medication Dose Route Frequency Provider Last Rate Last Dose  . 0.9 %  sodium chloride infusion   Intravenous Continuous Monia Sabal, PA-C        ROS Review of Systems  Constitutional: Negative for activity change, appetite change and fatigue.  HENT: Negative for congestion, sinus pressure and sore throat.   Eyes: Negative for visual disturbance.  Respiratory: Negative for cough, chest tightness, shortness of breath and wheezing.   Cardiovascular: Negative for chest pain and palpitations.  Gastrointestinal: Negative for abdominal distention, abdominal pain and constipation.  Endocrine: Negative for polydipsia.  Genitourinary: Negative for dysuria and frequency.  Musculoskeletal: Negative for arthralgias and back pain.  Skin:       See hpi  Neurological: Negative for tremors, light-headedness and numbness.  Hematological: Does not bruise/bleed easily.  Psychiatric/Behavioral: Negative for agitation and behavioral problems.    Objective:  BP (!) 156/78   Pulse  76   Temp (!) 97.3 F (36.3 C) (Oral)   Ht 5' (1.524 m)   Wt 107 lb 3.2 oz (48.6 kg)   LMP  (LMP Unknown)   SpO2 96%   BMI 20.94 kg/m   BP/Weight 03/27/2018 2/72/5366 10/04/345  Systolic BP 425 956 387  Diastolic BP 78 50 73  Wt. (Lbs) 107.2 146.61 150  BMI 20.94 26.81 29.29      Physical Exam  Constitutional: She is oriented to person, place, and time. She appears well-developed and well-nourished.  Cardiovascular: Normal rate, normal heart sounds and intact distal pulses.  No murmur heard. Pulmonary/Chest: Effort normal and breath sounds normal. She has no wheezes. She has no rales. She exhibits no tenderness.  Abdominal: Soft. Bowel sounds are normal. She exhibits no distension and no mass. There is no tenderness.  Musculoskeletal: Normal range of motion.  Normal dorsalis pedis bilaterally, intact sensation Amputation of left third toe  Neurological: She is alert and oriented to person, place, and time.  Psychiatric:  She has a normal mood and affect.    Lipid Panel     Component Value Date/Time   CHOL 246 (H) 01/13/2016 1051   TRIG 329 (H) 01/13/2016 1051   HDL 50 01/13/2016 1051   CHOLHDL 4.9 01/13/2016 1051   VLDL 66 (H) 01/13/2016 1051   LDLCALC 130 (H) 01/13/2016 1051     Lab Results  Component Value Date   HGBA1C 11.1 (A) 03/27/2018     Assessment & Plan:   1. Pure hypercholesterolemia Uncontrolled Continue statin, low-cholesterol diet - atorvastatin (LIPITOR) 40 MG tablet; Take 1 tablet (40 mg total) by mouth daily.  Dispense: 30 tablet; Refill: 5  2. Insulin-requiring or dependent type II diabetes mellitus ([place]) Uncontrolled with A1c of 11.1 Increased dose of Lantus to 20 units in the morning and 10 units in the evening - POCT glucose (manual entry) - POCT glycosylated hemoglobin (Hb A1C) - insulin glargine (LANTUS) 100 UNIT/ML injection; Inject subcutaneously twice daily 20 units in the morning and 10 units in the evening  Dispense: 30 mL;  Refill: 5  3. Other diabetic neurological complication associated with type 2 diabetes mellitus (H[facility]) Stable - DULoxetine (CYMBALTA) 60 MG capsule; Take 1 capsule (60 mg total) by mouth daily.  Dispense: 30 capsule; Refill: 6  4. ESRD (end stage renal disease) ([place]) Continue hemodialysis as per schedule  5. Essential hypertension Uncontrolled  6. Skin ulcer of left heel, limited to breakdown of skin ([place]) Early stage pressure ulcer Advised to abstain from frequently lying down in the bed with heel on the bed as this can worsen also Frequent change in position advised We will reassess at next visit and referred to wound care if worsening as she is at a high risk due to her diabetes - bacitracin 500 UNIT/GM ointment; Apply 1 application topically 2 (two) times daily.  Dispense: 28 g; Refill: 1   Meds ordered this encounter  Medications  . atorvastatin (LIPITOR) 40 MG tablet    Sig: Take 1 tablet (40 mg total) by mouth daily.    Dispense:  30 tablet    Refill:  5  . insulin glargine (LANTUS) 100 UNIT/ML injection    Sig: Inject subcutaneously twice daily 20 units in the morning and 10 units in the evening    Dispense:  30 mL    Refill:  5    Discontinue previous dose  . DULoxetine (CYMBALTA) 60 MG capsule    Sig: Take 1 capsule (60 mg total) by mouth daily.    Dispense:  30 capsule    Refill:  6  . bacitracin 500 UNIT/GM ointment    Sig: Apply 1 application topically 2 (two) times daily.    Dispense:  28 g    Refill:  1    Follow-up: Return in about 3 weeks (around 04/17/2018) for follow up of left foot ulcer.   [name] MD

## 2018-03-28 ENCOUNTER — Encounter: Payer: Self-pay | Admitting: Family Medicine

## 2018-03-28 MED FILL — ETHYL CHLORIDE AERO: 30 days supply | Qty: 104 | Fill #4

## 2018-03-29 ENCOUNTER — Other Ambulatory Visit: Payer: Self-pay | Admitting: Family Medicine

## 2018-03-29 DIAGNOSIS — E1149 Type 2 diabetes mellitus with other diabetic neurological complication: Secondary | ICD-10-CM

## 2018-03-29 MED FILL — GABAPENTIN 300 MG CAPSULE: 300 | 30 days supply | Qty: 60 | Fill #0

## 2018-04-01 ENCOUNTER — Other Ambulatory Visit: Payer: Self-pay | Admitting: Family Medicine

## 2018-04-01 DIAGNOSIS — I10 Essential (primary) hypertension: Secondary | ICD-10-CM

## 2018-04-01 MED FILL — ?DULoxetine HCL 60MG CPEP: 60 | 30 days supply | Qty: 30 | Fill #0

## 2018-04-16 MED FILL — ?CETIRIZINE HCL 10 MG TABLE: 10 | 30 days supply | Qty: 30 | Fill #1

## 2018-04-16 MED FILL — VALSARTAN 160 MG TABLET: 160 | 30 days supply | Qty: 30 | Fill #0

## 2018-05-01 ENCOUNTER — Other Ambulatory Visit (HOSPITAL_COMMUNITY): Payer: Self-pay | Admitting: Nephrology

## 2018-05-01 DIAGNOSIS — N186 End stage renal disease: Secondary | ICD-10-CM

## 2018-05-03 MED FILL — ?DULoxetine HCL 60MG CPEP: 60 | 30 days supply | Qty: 30 | Fill #1

## 2018-05-03 MED FILL — ?ATORVASTATIN 40MG TABLET: 40 | 30 days supply | Qty: 30 | Fill #1

## 2018-05-03 MED FILL — GABAPENTIN 300 MG CAPSULE: 300 | 30 days supply | Qty: 60 | Fill #1

## 2018-05-07 ENCOUNTER — Other Ambulatory Visit: Payer: Self-pay | Admitting: Radiology

## 2018-05-07 ENCOUNTER — Other Ambulatory Visit: Payer: Self-pay | Admitting: Physician Assistant

## 2018-05-08 ENCOUNTER — Other Ambulatory Visit: Payer: Self-pay

## 2018-05-08 ENCOUNTER — Encounter (HOSPITAL_COMMUNITY): Payer: Self-pay

## 2018-05-08 ENCOUNTER — Ambulatory Visit (HOSPITAL_COMMUNITY)
Admission: RE | Admit: 2018-05-08 | Discharge: 2018-05-08 | Disposition: A | Discharge status: Home / Self Care | Payer: Self-pay | Source: Ambulatory Visit | Attending: Nephrology | Admitting: Nephrology

## 2018-05-08 DIAGNOSIS — T82590A Other mechanical complication of surgically created arteriovenous fistula, initial encounter: Secondary | ICD-10-CM | POA: Insufficient documentation

## 2018-05-08 DIAGNOSIS — Z794 Long term (current) use of insulin: Secondary | ICD-10-CM | POA: Insufficient documentation

## 2018-05-08 DIAGNOSIS — E1140 Type 2 diabetes mellitus with diabetic neuropathy, unspecified: Secondary | ICD-10-CM | POA: Insufficient documentation

## 2018-05-08 DIAGNOSIS — Z8619 Personal history of other infectious and parasitic diseases: Secondary | ICD-10-CM | POA: Insufficient documentation

## 2018-05-08 DIAGNOSIS — Z9889 Other specified postprocedural states: Secondary | ICD-10-CM | POA: Insufficient documentation

## 2018-05-08 DIAGNOSIS — Z89422 Acquired absence of other left toe(s): Secondary | ICD-10-CM | POA: Insufficient documentation

## 2018-05-08 DIAGNOSIS — E079 Disorder of thyroid, unspecified: Secondary | ICD-10-CM | POA: Insufficient documentation

## 2018-05-08 DIAGNOSIS — N186 End stage renal disease: Secondary | ICD-10-CM | POA: Insufficient documentation

## 2018-05-08 DIAGNOSIS — E1122 Type 2 diabetes mellitus with diabetic chronic kidney disease: Secondary | ICD-10-CM | POA: Insufficient documentation

## 2018-05-08 DIAGNOSIS — Z992 Dependence on renal dialysis: Secondary | ICD-10-CM | POA: Insufficient documentation

## 2018-05-08 DIAGNOSIS — E785 Hyperlipidemia, unspecified: Secondary | ICD-10-CM | POA: Insufficient documentation

## 2018-05-08 DIAGNOSIS — Z79899 Other long term (current) drug therapy: Secondary | ICD-10-CM | POA: Insufficient documentation

## 2018-05-08 DIAGNOSIS — Y832 Surgical operation with anastomosis, bypass or graft as the cause of abnormal reaction of the patient, or of later complication, without mention of misadventure at the time of the procedure: Secondary | ICD-10-CM | POA: Insufficient documentation

## 2018-05-08 DIAGNOSIS — I120 Hypertensive chronic kidney disease with stage 5 chronic kidney disease or end stage renal disease: Secondary | ICD-10-CM | POA: Insufficient documentation

## 2018-05-08 HISTORY — PX: IR DIALY SHUNT INTRO NEEDLE/INTRACATH INITIAL W/IMG LEFT: IMG6102

## 2018-05-08 LAB — CBC
HCT: 33.6 % — ABNORMAL LOW (ref 36.0–46.0)
HEMOGLOBIN: 10.7 g/dL — AB (ref 12.0–15.0)
MCH: 30.9 pg (ref 26.0–34.0)
MCHC: 31.8 g/dL (ref 30.0–36.0)
MCV: 97.1 fL (ref 80.0–100.0)
NRBC: 0 % (ref 0.0–0.2)
PLATELETS: 160 10*3/uL (ref 150–400)
RBC: 3.46 MIL/uL — AB (ref 3.87–5.11)
RDW: 13.8 % (ref 11.5–15.5)
WBC: 6.4 10*3/uL (ref 4.0–10.5)

## 2018-05-08 LAB — PROTIME-INR
INR: 1.15
PROTHROMBIN TIME: 14.6 s (ref 11.4–15.2)

## 2018-05-08 LAB — GLUCOSE, CAPILLARY: GLUCOSE-CAPILLARY: 79 mg/dL (ref 70–99)

## 2018-05-08 LAB — APTT: aPTT: 34 seconds (ref 24–36)

## 2018-05-08 MED ORDER — IOPAMIDOL (ISOVUE-300) INJECTION 61%
INTRAVENOUS | Status: AC
  Administered 2018-05-08: 24 mL
  Filled 2018-05-08: qty 100

## 2018-05-08 MED ORDER — SODIUM CHLORIDE 0.9 % IV SOLN: INTRAVENOUS | Status: DC

## 2018-05-08 NOTE — Consult Note (Signed)
Pt not in room, RN states they will use HD cath, no PIV needed

## 2018-05-08 NOTE — H&P (Signed)
Chief Complaint: Patient was seen in consultation today for left arm dialysis access shuntogram with possible intrervention at the request of [facility] [place]  Referring Physician(s): Coladonato,Joseph  Supervising Physician: Marybelle Killings  Patient Status: [facility] - Out-pt  History of Present Illness: Sarah Kelley is a 67 y.o. female   Left dialysis fistula slow flows for weeks Last use yesterday--did complete dialysis  Last intervention 02/27/18:  Initial av fistulagram demonstrates an antecubital fossa AV fistula which is patent. Central venous structures are patent. There is recurrent mid arm outflow cephalic vein stenosis. After dilatation to 6 mm, there is improvement in the cephalic vein stenosis. After dilatation to 7 mm, the outflow vein was noted to be widely patent.  Scheduled now for fistulogram with possible intervention   Past Medical History:  Diagnosis Date  . Diabetes mellitus   . ESRD (end stage renal disease) ([place])   . Hyperlipidemia   . Hypertension   . Neuropathy    Diabetic  . Pneumonia 07/2016  . Renal disorder   . Sepsis ([place])   . Thyroid disease     Past Surgical History:  Procedure Laterality Date  . 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 AV FISTULA PLACEMENT Left 03/28/2016   Procedure: ARTERIOVENOUS (AV) FISTULA CREATION LEFT ARM;  Surgeon: Waynetta Sandy, MD;  Location: [place];  Service: Vascular;  Laterality: Left;  . DILATION AND CURETTAGE OF UTERUS    . FISTULA SUPERFICIALIZATION Left 09/04/2016   Procedure: FISTULA SUPERFICIALIZATION LEFT UPPER ARM;  Surgeon: Waynetta Sandy, MD;  Location: [place];  Service: Vascular;  Laterality: Left;  . IR AV DIALY SHUNT INTRO NEEDLE/INTRAC INITIAL W/PTA/STENT/IMG LT Left 06/13/2017  . IR AV DIALY SHUNT INTRO NEEDLE/INTRAC INITIAL W/PTA/STENT/IMG LT  Left 09/12/2017  . IR AV DIALY SHUNT INTRO NEEDLE/INTRACATH INITIAL W/PTA/IMG LEFT  11/10/2016  . IR AV DIALY SHUNT INTRO NEEDLE/INTRACATH INITIAL W/PTA/IMG LEFT  03/09/2017  . IR AV DIALY SHUNT INTRO NEEDLE/INTRACATH INITIAL W/PTA/IMG LEFT  12/12/2017  . IR AV DIALY SHUNT INTRO NEEDLE/INTRACATH INITIAL W/PTA/IMG LEFT  02/27/2018  . IR GENERIC HISTORICAL  08/01/2016   IR FLUORO GUIDE CV LINE RIGHT 08/01/2016 [name], MD [facility]-INTERV RAD  . IR GENERIC HISTORICAL  08/01/2016   IR US GUIDE VASC ACCESS RIGHT 08/01/2016 [name], MD [facility]INTER[facility]  . IR REMOVAL TUN CV CATH W/O FL  11/01/2016  . IR US GUIDE VASC ACCESS LEFT  11/10/2016  . IR US GUIDE VASC ACCESS LEFT  09/12/2017  . IR US GUIDE VASC ACCESS LEFT  12/12/2017    Allergies: No known allergies  Medications: Prior to Admission medications   Medication Sig Start Date End Date Taking? Authorizing Provider  Amino Acids-Protein Hydrolys (FEEDING SUPPLEMENT, PRO-STAT SUGAR FREE 64,) LIQD Take 30 mLs by mouth 2 (two) times daily. 02/17/18   Elgergawy, Silver Huguenin, MD  atorvastatin (LIPITOR) 40 MG tablet Take 1 tablet (40 mg total) by mouth daily. 03/27/18   [name], MD  bacitracin 500 UNIT/GM ointment Apply 1 application topically 2 (two) times daily. 03/27/18   [name], MD  Blood Glucose Monitoring Suppl (TRUE METRIX METER) DEVI 1 each by Does not apply route 3 (three) times daily before meals. 07/25/17   [name], MD  cetirizine (ZYRTEC) 10 MG tablet Take 10 mg by mouth daily.    [provider]  DULoxetine (CYMBALTA) 60 MG  capsule Take 1 capsule (60 mg total) by mouth daily. 03/27/18   [name], MD  gabapentin (NEURONTIN) 300 MG capsule Take 1 capsule (300 mg total) by mouth at bedtime. 02/17/18   Elgergawy, Silver Huguenin, MD  gabapentin (NEURONTIN) 300 MG capsule TAKE 2 CAPSULES (600 MG TOTAL) BY MOUTH AT BEDTIME. 03/29/18   [name], MD  glucose blood (TRUE METRIX BLOOD GLUCOSE TEST) test strip Use 3 times daily  before meals 07/25/17   [name], MD  insulin glargine (LANTUS) 100 UNIT/ML injection Inject subcutaneously twice daily 20 units in the morning and 10 units in the evening 03/27/18   [name], MD  Insulin Syringe-Needle U-100 (BD INSULIN SYRINGE ULTRAFINE) 31G X 15/64" 0.5 ML MISC 1 each by Does not apply route daily. 07/25/17   [name], MD  oxyCODONE-acetaminophen (PERCOCET) 5-325 MG tablet Take 1 tablet by mouth every 4 (four) hours as needed for moderate pain. Patient not taking: Reported on 10/01/2876 12/07/65   Delora Fuel, MD  TRUEPLUS LANCETS 28G MISC 1 each by Does not apply route 3 (three) times daily before meals. 07/25/17   [name], MD  valsartan (DIOVAN) 160 MG tablet TAKE 1 TABLET (160 MG TOTAL) BY MOUTH DAILY. 04/01/18   [name], MD     History reviewed. No pertinent family history.  Social History   Socioeconomic History  . Marital status: Widowed    Spouse name: Not on file  . Number of children: Not on file  . Years of education: Not on file  . Highest education level: Not on file  Occupational History  . Not on file  Social Needs  . Financial resource strain: Not on file  . Food insecurity:    Worry: Not on file    Inability: Not on file  . Transportation needs:    Medical: Not on file    Non-medical: Not on file  Tobacco Use  . Smoking status: Never Smoker  . Smokeless tobacco: Never Used  Substance and Sexual Activity  . Alcohol use: No    Alcohol/week: 0.0 standard drinks    Comment: rare- a beer every now and then  . Drug use: No  . Sexual activity: Not Currently  Lifestyle  . Physical activity:    Days per week: Not on file    Minutes per session: Not on file  . Stress: Not on file  Relationships  . Social connections:    Talks on phone: Not on file    Gets together: Not on file    Attends religious service: Not on file    Active member of club or organization: Not on file    Attends meetings of clubs or  organizations: Not on file    Relationship status: Not on file  Other Topics Concern  . Not on file  Social History Narrative  . Not on file     Review of Systems: A 12 point ROS discussed and pertinent positives are indicated in the HPI above.  All other systems are negative.  Review of Systems  Constitutional: Negative for activity change.  Respiratory: Negative for cough and shortness of breath.   Cardiovascular: Negative for chest pain.  Gastrointestinal: Negative for abdominal pain.  Neurological: Negative for weakness.  Psychiatric/Behavioral: Negative for behavioral problems and confusion.    Vital Signs: BP 99/80   Pulse 66   Temp 98.4 F (36.9 C)   Wt 107 lb 2.3 oz (48.6 kg)   LMP  (LMP Unknown)  SpO2 99%   BMI 20.93 kg/m   Physical Exam  Constitutional: She is oriented to person, place, and time.  Cardiovascular: Normal rate, regular rhythm and normal heart sounds.  Pulmonary/Chest: Effort normal and breath sounds normal.  Abdominal: Soft. Bowel sounds are normal.  Musculoskeletal: Normal range of motion.  Left fistula good pulse No thrill  Neurological: She is alert and oriented to person, place, and time.  Skin: Skin is warm and dry.  Psychiatric: She has a normal mood and affect. Her behavior is normal. Judgment and thought content normal.  Pt does not speak Higher education careers adviser at bedside Son at bedside  Vitals reviewed.   Imaging: No results found.  Labs:  CBC: Recent Labs    02/13/18 0402 02/14/18 0450 02/15/18 0527 02/16/18 0442  WBC 10.1 7.3 6.4 6.4  HGB 10.8* 10.5* 11.3* 10.0*  HCT 34.6* 32.8* 34.7* 31.7*  PLT 134* 139* 113* 178    COAGS: No results for input(s): INR, APTT in the last 8760 hours.  BMP: Recent Labs    02/13/18 0402 02/14/18 0450 02/15/18 0527 02/16/18 0442  NA 133* 133* 139 135  K 4.2 3.8 4.0 4.2  CL 92* 92* 99 95*  CO2 25 24 27 26   GLUCOSE 269* 53* 109* 147*  BUN 41* 61* 24* 46*  CALCIUM  8.6* 8.6* 8.8* 8.7*  CREATININE 5.70* 7.45* 4.55* 6.84*  GFRNONAA 7* 5* 9* 6*  GFRAA 8* 6* 10* 6*    LIVER FUNCTION TESTS: Recent Labs    02/10/18 2106 02/11/18 0940 02/12/18 0418  BILITOT 0.8 0.7  --   AST 24 24  --   ALT 18 17  --   ALKPHOS 137* 106  --   PROT 6.9 6.5  --   ALBUMIN 3.3* 2.8* 2.5*    TUMOR MARKERS: No results for input(s): AFPTM, CEA, CA199, CHROMGRNA in the last 8760 hours.  Assessment and Plan:  Left arm fistula with slowing flows Scheduled for fistulogram and possible intervention Pt and son are aware of procedure benefits and risks Including but not limited to Infection; bleeding; damage to fistula Agreeable to proceed Consent signed an din chart  Thank you for this interesting consult.  I greatly enjoyed meeting [facility] and look forward to participating in their care.  A copy of this report was sent to the requesting provider on this date.  Electronically Signed: Lavonia Drafts, PA-C 05/08/2018, 9:15 AM   I spent a total of    25 Minutes in face to face in clinical consultation, greater than 50% of which was counseling/coordinating care for left arm dialysis access eval and intervention

## 2018-05-16 MED FILL — ?CETIRIZINE HCL 10 MG TABLE: 10 | 30 days supply | Qty: 30 | Fill #2

## 2018-05-16 MED FILL — VALSARTAN 160 MG TABLET: 160 | 30 days supply | Qty: 30 | Fill #1

## 2018-05-21 ENCOUNTER — Ambulatory Visit: Payer: Self-pay | Admitting: Family Medicine

## 2018-05-23 MED FILL — ETHYL CHLORIDE AERO: 30 days supply | Qty: 104 | Fill #5

## 2018-05-29 MED FILL — ?ATORVASTATIN 40MG TABLET: 40 | 30 days supply | Qty: 30 | Fill #2

## 2018-05-29 MED FILL — GABAPENTIN 300 MG CAPSULE: 300 | 30 days supply | Qty: 60 | Fill #2

## 2018-05-29 MED FILL — TRUEPLUS SYR 1ML 31GX5/16": 31G X 5/16" | 24 days supply | Qty: 80 | Fill #1

## 2018-05-29 MED FILL — TRUEPLUS SYR 1ML 31GX5/16: 31G X 5/16" | 24 days supply | Qty: 80 | Fill #1

## 2018-06-03 ENCOUNTER — Other Ambulatory Visit (HOSPITAL_COMMUNITY): Payer: Self-pay | Admitting: Nephrology

## 2018-06-03 DIAGNOSIS — N186 End stage renal disease: Secondary | ICD-10-CM

## 2018-06-04 ENCOUNTER — Other Ambulatory Visit: Payer: Self-pay | Admitting: Student

## 2018-06-04 ENCOUNTER — Other Ambulatory Visit: Payer: Self-pay | Admitting: Radiology

## 2018-06-05 ENCOUNTER — Other Ambulatory Visit (HOSPITAL_COMMUNITY): Payer: Self-pay | Admitting: Nephrology

## 2018-06-05 ENCOUNTER — Encounter (HOSPITAL_COMMUNITY): Payer: Self-pay | Admitting: Interventional Radiology

## 2018-06-05 ENCOUNTER — Ambulatory Visit (HOSPITAL_COMMUNITY)
Admission: RE | Admit: 2018-06-05 | Discharge: 2018-06-05 | Disposition: A | Discharge status: Home / Self Care | Payer: Self-pay | Source: Ambulatory Visit | Attending: Nephrology | Admitting: Nephrology

## 2018-06-05 ENCOUNTER — Other Ambulatory Visit: Payer: Self-pay

## 2018-06-05 DIAGNOSIS — E785 Hyperlipidemia, unspecified: Secondary | ICD-10-CM | POA: Insufficient documentation

## 2018-06-05 DIAGNOSIS — E1140 Type 2 diabetes mellitus with diabetic neuropathy, unspecified: Secondary | ICD-10-CM | POA: Insufficient documentation

## 2018-06-05 DIAGNOSIS — E1122 Type 2 diabetes mellitus with diabetic chronic kidney disease: Secondary | ICD-10-CM | POA: Insufficient documentation

## 2018-06-05 DIAGNOSIS — T82858A Stenosis of vascular prosthetic devices, implants and grafts, initial encounter: Secondary | ICD-10-CM | POA: Insufficient documentation

## 2018-06-05 DIAGNOSIS — N186 End stage renal disease: Secondary | ICD-10-CM

## 2018-06-05 DIAGNOSIS — I120 Hypertensive chronic kidney disease with stage 5 chronic kidney disease or end stage renal disease: Secondary | ICD-10-CM | POA: Insufficient documentation

## 2018-06-05 DIAGNOSIS — Z794 Long term (current) use of insulin: Secondary | ICD-10-CM | POA: Insufficient documentation

## 2018-06-05 DIAGNOSIS — Y832 Surgical operation with anastomosis, bypass or graft as the cause of abnormal reaction of the patient, or of later complication, without mention of misadventure at the time of the procedure: Secondary | ICD-10-CM | POA: Insufficient documentation

## 2018-06-05 DIAGNOSIS — E079 Disorder of thyroid, unspecified: Secondary | ICD-10-CM | POA: Insufficient documentation

## 2018-06-05 HISTORY — PX: IR AV DIALY SHUNT INTRO NEEDLE/INTRACATH INITIAL W/PTA/IMG LEFT: IMG6103

## 2018-06-05 HISTORY — PX: IR US GUIDE VASC ACCESS LEFT: IMG2389

## 2018-06-05 LAB — CBC
HCT: 38.8 % (ref 36.0–46.0)
HEMOGLOBIN: 11.5 g/dL — AB (ref 12.0–15.0)
MCH: 30.3 pg (ref 26.0–34.0)
MCHC: 29.6 g/dL — ABNORMAL LOW (ref 30.0–36.0)
MCV: 102.1 fL — ABNORMAL HIGH (ref 80.0–100.0)
NRBC: 0 % (ref 0.0–0.2)
PLATELETS: 181 10*3/uL (ref 150–400)
RBC: 3.8 MIL/uL — AB (ref 3.87–5.11)
RDW: 15.2 % (ref 11.5–15.5)
WBC: 4.9 10*3/uL (ref 4.0–10.5)

## 2018-06-05 LAB — BASIC METABOLIC PANEL
ANION GAP: 15 (ref 5–15)
BUN: 38 mg/dL — ABNORMAL HIGH (ref 8–23)
CHLORIDE: 97 mmol/L — AB (ref 98–111)
CO2: 28 mmol/L (ref 22–32)
Calcium: 9.2 mg/dL (ref 8.9–10.3)
Creatinine, Ser: 5.95 mg/dL — ABNORMAL HIGH (ref 0.44–1.00)
GFR, EST AFRICAN AMERICAN: 8 mL/min — AB (ref >60)
GFR, EST NON AFRICAN AMERICAN: 7 mL/min — AB (ref >60)
Glucose, Bld: 154 mg/dL — ABNORMAL HIGH (ref 70–99)
POTASSIUM: 4.5 mmol/L (ref 3.5–5.1)
SODIUM: 140 mmol/L (ref 135–145)

## 2018-06-05 LAB — PROTIME-INR
INR: 1.14
PROTHROMBIN TIME: 14.5 s (ref 11.4–15.2)

## 2018-06-05 LAB — GLUCOSE, CAPILLARY: GLUCOSE-CAPILLARY: 147 mg/dL — AB (ref 70–99)

## 2018-06-05 MED ORDER — LIDOCAINE HCL (PF) 1 % IJ SOLN
INTRAMUSCULAR | Status: DC | PRN
  Administered 2018-06-05: 5 mL

## 2018-06-05 MED ORDER — LIDOCAINE-EPINEPHRINE (PF) 1 %-1:200000 IJ SOLN
INTRAMUSCULAR | Status: AC
  Filled 2018-06-05: qty 30

## 2018-06-05 MED ORDER — IOPAMIDOL (ISOVUE-300) INJECTION 61%
INTRAVENOUS | Status: AC
  Filled 2018-06-05: qty 50

## 2018-06-05 MED ORDER — LIDOCAINE HCL 1 % IJ SOLN
INTRAMUSCULAR | Status: AC
  Filled 2018-06-05: qty 20

## 2018-06-05 NOTE — H&P (Signed)
Chief Complaint: Patient was seen in consultation today for fistulogram  Referring Physician(s): Coladonato,Joseph  Supervising Physician: [name]  Patient Status: [facility] - Out-pt  History of Present Illness: Sarah Kelley is a 67 y.o. female with a past medical history significant for DM,, HLD, HTN, neuropathy and ESRD on dialysis via left AVF T/Th/S with last successful dialysis yesterday 12/3. Request has been made to IR for fistulogram with possible intervention due to drop in access flow x 2.   Patient present with son at bedside, she is spanish speaking only and history is obtained via interpreter [place]. Patient reports that she is not sure why she is here - she and her son state she has not had any issues with dialysis and she was told to not allow anyone to touch her fistula besides the staff at the dialysis center. She states that she underwent successful dialysis yesterday without any issues with cannulation, flow or bleeding to her knowledge. She does complain of bilateral hand pain, right foot pain and abdominal pain which is chronic and not related to dialysis.   Past Medical History:  Diagnosis Date  . Diabetes mellitus   . ESRD (end stage renal disease) ([place])   . Hyperlipidemia   . Hypertension   . Neuropathy    Diabetic  . Pneumonia 07/2016  . Renal disorder   . Sepsis ([place])   . Thyroid disease     Past Surgical History:  Procedure Laterality Date  . AMPUTATION Left 05/21/2015   Procedure: Left Foot 3rd Ray Amputation;  Surgeon: Newt Minion, MD;  Location: [place];  Service: Orthopedics;  Laterality: Left;  . AV FISTULA PLACEMENT Left 03/28/2016   Procedure: ARTERIOVENOUS (AV) FISTULA CREATION LEFT ARM;  Surgeon: Waynetta Sandy, MD;  Location: [place];  Service: Vascular;  Laterality: Left;  . DILATION AND CURETTAGE OF UTERUS    . FISTULA SUPERFICIALIZATION Left 09/04/2016   Procedure: FISTULA SUPERFICIALIZATION LEFT UPPER ARM;  Surgeon: Waynetta Sandy, MD;  Location: [place];  Service: Vascular;  Laterality: Left;  . IR AV DIALY SHUNT INTRO NEEDLE/INTRAC INITIAL W/PTA/STENT/IMG LT Left 06/13/2017  . IR AV DIALY SHUNT INTRO NEEDLE/INTRAC INITIAL W/PTA/STENT/IMG LT Left 09/12/2017  . IR AV DIALY SHUNT INTRO NEEDLE/INTRACATH INITIAL W/PTA/IMG LEFT  11/10/2016  . IR AV DIALY SHUNT INTRO NEEDLE/INTRACATH INITIAL W/PTA/IMG LEFT  03/09/2017  . IR AV DIALY SHUNT INTRO NEEDLE/INTRACATH INITIAL W/PTA/IMG LEFT  12/12/2017  . IR AV DIALY SHUNT INTRO NEEDLE/INTRACATH INITIAL W/PTA/IMG LEFT  02/27/2018  . IR DIALY SHUNT INTRO NEEDLE/INTRACATH INITIAL W/IMG LEFT Left 05/08/2018  . IR GENERIC HISTORICAL  08/01/2016   IR FLUORO GUIDE CV LINE RIGHT 08/01/2016 [name], MD [facility]INTER[facility]  . IR GENERIC HISTORICAL  08/01/2016   IR US GUIDE VASC ACCESS RIGHT 08/01/2016 [name], MD [facility]  . IR REMOVAL TUN CV CATH W/O FL  11/01/2016  . IR US GUIDE VASC ACCESS LEFT  11/10/2016  . IR US GUIDE VASC ACCESS LEFT  09/12/2017  . IR US GUIDE VASC ACCESS LEFT  12/12/2017    Allergies: No known allergies  Medications: Prior to Admission medications   Medication Sig Start Date End Date Taking? Authorizing Provider  atorvastatin (LIPITOR) 40 MG tablet Take 1 tablet (40 mg total) by mouth daily. 03/27/18  Yes [name], MD  cetirizine (ZYRTEC) 10 MG tablet Take 10 mg by mouth daily.   Yes [provider]  DULoxetine (CYMBALTA) 60 MG capsule Take 1 capsule (60 mg  total) by mouth daily. 03/27/18  Yes [name], MD  gabapentin (NEURONTIN) 300 MG capsule Take 1 capsule (300 mg total) by mouth at bedtime. 02/17/18  Yes Elgergawy, Silver Huguenin, MD  gabapentin (NEURONTIN) 300 MG capsule TAKE 2 CAPSULES (600 MG TOTAL) BY MOUTH AT BEDTIME. 03/29/18  Yes Newlin, [name], MD  insulin glargine (LANTUS) 100 UNIT/ML injection Inject subcutaneously twice daily 20 units in the morning and 10 units in the evening 03/27/18  Yes Newlin, [name], MD  valsartan  (DIOVAN) 160 MG tablet TAKE 1 TABLET (160 MG TOTAL) BY MOUTH DAILY. 04/01/18  Yes [name], MD  Amino Acids-Protein Hydrolys (FEEDING SUPPLEMENT, PRO-STAT SUGAR FREE 64,) LIQD Take 30 mLs by mouth 2 (two) times daily. 02/17/18   Elgergawy, Silver Huguenin, MD  bacitracin 500 UNIT/GM ointment Apply 1 application topically 2 (two) times daily. 03/27/18   [name], MD  Blood Glucose Monitoring Suppl (TRUE METRIX METER) DEVI 1 each by Does not apply route 3 (three) times daily before meals. 07/25/17   [name], MD  glucose blood (TRUE METRIX BLOOD GLUCOSE TEST) test strip Use 3 times daily before meals 07/25/17   [name], MD  Insulin Syringe-Needle U-100 (BD INSULIN SYRINGE ULTRAFINE) 31G X 15/64" 0.5 ML MISC 1 each by Does not apply route daily. 07/25/17   [name], MD  oxyCODONE-acetaminophen (PERCOCET) 5-325 MG tablet Take 1 tablet by mouth every 4 (four) hours as needed for moderate pain. Patient not taking: Reported on 10/08/5460 7/0/35   Delora Fuel, MD  TRUEPLUS LANCETS 28G MISC 1 each by Does not apply route 3 (three) times daily before meals. 07/25/17   [name], MD     No family history on file.  Social History   Socioeconomic History  . Marital status: Widowed    Spouse name: Not on file  . Number of children: Not on file  . Years of education: Not on file  . Highest education level: Not on file  Occupational History  . Not on file  Social Needs  . Financial resource strain: Not on file  . Food insecurity:    Worry: Not on file    Inability: Not on file  . Transportation needs:    Medical: Not on file    Non-medical: Not on file  Tobacco Use  . Smoking status: Never Smoker  . Smokeless tobacco: Never Used  Substance and Sexual Activity  . Alcohol use: No    Alcohol/week: 0.0 standard drinks    Comment: rare- a beer every now and then  . Drug use: No  . Sexual activity: Not Currently  Lifestyle  . Physical activity:    Days per week: Not on  file    Minutes per session: Not on file  . Stress: Not on file  Relationships  . Social connections:    Talks on phone: Not on file    Gets together: Not on file    Attends religious service: Not on file    Active member of club or organization: Not on file    Attends meetings of clubs or organizations: Not on file    Relationship status: Not on file  Other Topics Concern  . Not on file  Social History Narrative  . Not on file     Review of Systems: A 12 point ROS discussed and pertinent positives are indicated in the HPI above.  All other systems are negative.  Review of Systems  Constitutional: Negative for chills and fever.  Respiratory:  Negative for cough and shortness of breath.   Cardiovascular: Negative for chest pain.  Gastrointestinal: Positive for abdominal pain.  Musculoskeletal: Positive for arthralgias.  Skin: Negative for color change.  Neurological: Negative for dizziness and syncope.    Vital Signs: BP (!) 120/58   Pulse 78   Temp 97.8 F (36.6 C)   Ht 5\' 1"  (1.549 m)   Wt 107 lb 2.3 oz (48.6 kg)   LMP  (LMP Unknown)   SpO2 100%   BMI 20.24 kg/m   Physical Exam  Constitutional: She is oriented to person, place, and time. No distress.  HENT:  Head: Normocephalic.  Cardiovascular: Normal rate, regular rhythm and normal heart sounds.  Left upper extremity AVF with palpable thrill, (+) bruit. Two small dressings present from dialysis yesterday with scant dried blood on gauze. No temperature/color discrepancy compared to right hand/arm.  Pulmonary/Chest: Effort normal and breath sounds normal.  Abdominal: Soft. She exhibits no distension. There is no tenderness.  Neurological: She is alert and oriented to person, place, and time.  Skin: Skin is warm and dry. She is not diaphoretic.  Psychiatric: She has a normal mood and affect. Her behavior is normal. Judgment and thought content normal.  Vitals reviewed.    MD Evaluation Airway: WNL Heart:  WNL Abdomen: WNL Chest/ Lungs: WNL ASA  Classification: 3 Mallampati/Airway Score: Two   Imaging: Ir Dialy Shunt Intro Needle/intracath Initial W/img Left  Result Date: 05/08/2018 INDICATION: End-stage renal disease. Decreased access flow in left arm AV fistula. EXAM: AV FISTULAGRAM MEDICATIONS: None. ANESTHESIA/SEDATION: None FLUOROSCOPY TIME:  Fluoroscopy Time:  minutes 18 seconds (5 mGy). COMPLICATIONS: None immediate. PROCEDURE: Informed written consent was obtained from the patient after a thorough discussion of the procedural risks, benefits and alternatives. All questions were addressed. Maximal Sterile Barrier Technique was utilized including caps, mask, sterile gowns, sterile gloves, sterile drape, hand hygiene and skin antiseptic. A timeout was performed prior to the initiation of the procedure. An 18 gauge Angiocath was inserted into the outflow arm cephalic vein. Contrast was injected and imaging was obtained. The Angiocath was removed and hemostasis was achieved with direct pressure. FINDINGS: The arteriovenous anastomosis, outflow cephalic vein, and central venous structures are patent. IMPRESSION: No significant stenosis in the left arm AV fistula circuit. ACCESS: This access remains amenable to future percutaneous interventions as clinically indicated. Electronically Signed   By: Marybelle Killings M.D.   On: 05/08/2018 10:39    Labs:  CBC: Recent Labs    02/14/18 0450 02/15/18 0527 02/16/18 0442 05/08/18 0933  WBC 7.3 6.4 6.4 6.4  HGB 10.5* 11.3* 10.0* 10.7*  HCT 32.8* 34.7* 31.7* 33.6*  PLT 139* 113* 178 160    COAGS: Recent Labs    05/08/18 0933  INR 1.15  APTT 34    BMP: Recent Labs    02/13/18 0402 02/14/18 0450 02/15/18 0527 02/16/18 0442  NA 133* 133* 139 135  K 4.2 3.8 4.0 4.2  CL 92* 92* 99 95*  CO2 25 24 27 26   GLUCOSE 269* 53* 109* 147*  BUN 41* 61* 24* 46*  CALCIUM 8.6* 8.6* 8.8* 8.7*  CREATININE 5.70* 7.45* 4.55* 6.84*  GFRNONAA 7* 5* 9* 6*   GFRAA 8* 6* 10* 6*    LIVER FUNCTION TESTS: Recent Labs    02/10/18 2106 02/11/18 0940 02/12/18 0418  BILITOT 0.8 0.7  --   AST 24 24  --   ALT 18 17  --   ALKPHOS 137* 106  --  PROT 6.9 6.5  --   ALBUMIN 3.3* 2.8* 2.5*    TUMOR MARKERS: No results for input(s): AFPTM, CEA, CA199, CHROMGRNA in the last 8760 hours.  Assessment and Plan:  Patient with ESRD on HD via left AVF T/Th/S with last successful HD yesterday - request has been made to IR for fistulogram with possible intervention due to reported poor flow. Patient previously had fistulogram 05/08/18 with Dr. Barbie Banner which showed no significant stenosis and no intervention was needed.   Patient has been NPO since last night, she did not take any medications this morning and does not take blood thinning medications. Pre-procedure labs pending at time of this note writing.  Risks and benefits discussed with the patient including, but not limited to bleeding, infection, vascular injury, pulmonary embolism, need for tunneled HD catheter placement or even death.  All of the patient's questions were answered, patient is agreeable to proceed.  Consent signed and in chart.  Thank you for this interesting consult.  I greatly enjoyed meeting [facility] and look forward to participating in their care.  A copy of this report was sent to the requesting provider on this date.  Electronically Signed: Joaquim Nam, PA-C 06/05/2018, 9:00 AM   I spent a total of  15 Minutes in face to face in clinical consultation, greater than 50% of which was counseling/coordinating care for fistulogram with possible intervention.

## 2018-06-13 MED FILL — ?DULoxetine HCL 60MG CPEP: 60 | 30 days supply | Qty: 30 | Fill #2

## 2018-06-14 MED FILL — TRUE METRIX TEST STRIP: 30 days supply | Qty: 100 | Fill #3

## 2018-06-17 ENCOUNTER — Other Ambulatory Visit: Payer: Self-pay | Admitting: Family Medicine

## 2018-06-17 MED FILL — VALSARTAN 160 MG TABLET: 160 | 30 days supply | Qty: 30 | Fill #2

## 2018-06-27 MED FILL — GABAPENTIN 300 MG CAPSULE: 300 | 30 days supply | Qty: 60 | Fill #3

## 2018-06-27 MED FILL — ?ATORVASTATIN 40MG TABLET: 40 | 30 days supply | Qty: 30 | Fill #3

## 2018-07-15 ENCOUNTER — Telehealth: Payer: Self-pay | Admitting: Family Medicine

## 2018-07-15 MED FILL — ?DULoxetine HCL 60MG CPEP: 60 | 30 days supply | Qty: 30 | Fill #3

## 2018-07-15 MED FILL — ?CETIRIZINE HCL 10 MG TABLE: 10 | 30 days supply | Qty: 30 | Fill #0

## 2018-07-15 NOTE — Telephone Encounter (Signed)
Patient's daughter came in to the clinic because patient is having dental work done and the dental office is wanting to get more information in order to provide patient care. Patient provided dental phone number [phone]  Patient's appointment is on 1/20

## 2018-07-19 NOTE — Telephone Encounter (Signed)
Dental office has been called and they informed me that they will reach out to patient.

## 2018-07-22 ENCOUNTER — Other Ambulatory Visit: Payer: Self-pay | Admitting: Family Medicine

## 2018-07-22 DIAGNOSIS — Z794 Long term (current) use of insulin: Principal | ICD-10-CM

## 2018-07-22 DIAGNOSIS — E119 Type 2 diabetes mellitus without complications: Secondary | ICD-10-CM

## 2018-07-22 MED FILL — TRUEPLUS SYR 1ML 31GX5/16": 31G X 5/16" | 25 days supply | Qty: 100 | Fill #0

## 2018-07-22 MED FILL — TRUEPLUS SYR 1ML 31GX5/16: 31G X 5/16" | 25 days supply | Qty: 100 | Fill #0

## 2018-07-25 ENCOUNTER — Ambulatory Visit: Payer: Self-pay | Attending: Family Medicine | Admitting: Physician Assistant

## 2018-07-25 ENCOUNTER — Other Ambulatory Visit: Payer: Self-pay | Admitting: Family Medicine

## 2018-07-25 VITALS — BP 130/71 | HR 86 | Temp 97.9°F | Ht 60.0 in

## 2018-07-25 DIAGNOSIS — Z79899 Other long term (current) drug therapy: Secondary | ICD-10-CM | POA: Insufficient documentation

## 2018-07-25 DIAGNOSIS — N186 End stage renal disease: Secondary | ICD-10-CM | POA: Insufficient documentation

## 2018-07-25 DIAGNOSIS — X58XXXA Exposure to other specified factors, initial encounter: Secondary | ICD-10-CM | POA: Insufficient documentation

## 2018-07-25 DIAGNOSIS — S025XXA Fracture of tooth (traumatic), initial encounter for closed fracture: Secondary | ICD-10-CM | POA: Insufficient documentation

## 2018-07-25 DIAGNOSIS — I120 Hypertensive chronic kidney disease with stage 5 chronic kidney disease or end stage renal disease: Secondary | ICD-10-CM | POA: Insufficient documentation

## 2018-07-25 DIAGNOSIS — Z794 Long term (current) use of insulin: Secondary | ICD-10-CM | POA: Insufficient documentation

## 2018-07-25 DIAGNOSIS — I10 Essential (primary) hypertension: Secondary | ICD-10-CM

## 2018-07-25 DIAGNOSIS — E785 Hyperlipidemia, unspecified: Secondary | ICD-10-CM | POA: Insufficient documentation

## 2018-07-25 DIAGNOSIS — E1122 Type 2 diabetes mellitus with diabetic chronic kidney disease: Secondary | ICD-10-CM | POA: Insufficient documentation

## 2018-07-25 DIAGNOSIS — Z789 Other specified health status: Secondary | ICD-10-CM

## 2018-07-25 DIAGNOSIS — E1165 Type 2 diabetes mellitus with hyperglycemia: Secondary | ICD-10-CM

## 2018-07-25 DIAGNOSIS — S025XXB Fracture of tooth (traumatic), initial encounter for open fracture: Secondary | ICD-10-CM

## 2018-07-25 DIAGNOSIS — Z01818 Encounter for other preprocedural examination: Secondary | ICD-10-CM

## 2018-07-25 DIAGNOSIS — E119 Type 2 diabetes mellitus without complications: Secondary | ICD-10-CM

## 2018-07-25 DIAGNOSIS — E079 Disorder of thyroid, unspecified: Secondary | ICD-10-CM | POA: Insufficient documentation

## 2018-07-25 DIAGNOSIS — E1140 Type 2 diabetes mellitus with diabetic neuropathy, unspecified: Secondary | ICD-10-CM | POA: Insufficient documentation

## 2018-07-25 LAB — POCT GLYCOSYLATED HEMOGLOBIN (HGB A1C): Hemoglobin A1C: 10.8 % — AB (ref 4.0–5.6)

## 2018-07-25 LAB — GLUCOSE, POCT (MANUAL RESULT ENTRY)
POC Glucose: 384 mg/dl — AB (ref 70–99)
POC Glucose: 435 mg/dl — AB (ref 70–99)

## 2018-07-25 MED ORDER — INSULIN GLARGINE 100 UNIT/ML ~~LOC~~ SOLN: SUBCUTANEOUS | 5 refills | Status: DC

## 2018-07-25 MED ORDER — INSULIN ASPART 100 UNIT/ML ~~LOC~~ SOLN
25.0000 [IU] | Freq: Once | SUBCUTANEOUS | Status: AC
  Administered 2018-07-25: 25 [IU] via SUBCUTANEOUS

## 2018-07-25 MED FILL — ETHYL CHLORIDE AERO: 30 days supply | Qty: 104 | Fill #6

## 2018-07-25 NOTE — Progress Notes (Signed)
Patient ID: Sarah Kelley, female   DOB: Mar 23, 1951, 68 y.o.   MRN: [id number]   Sarah Kelley, is a 68 y.o. female  [id number]:[id number]  [id number]:[id number]  DOB - 1951-02-01  Subjective:  Chief Complaint and HPI: Sarah Kelley is a 68 y.o. female here today to get form filled out saying her health conditions are under control so she can have a dental procedure done that has something to do with dentures.  She does not have the name(s) of the procedures.  She is currently taking 14 units of lantus in the morning and 12 units at night.  usu checks sugar around lunch time and is getting >300s.    [employer] interpreters translating.     ROS:   Constitutional:  No f/c, No night sweats, No unexplained weight loss. EENT:  No vision changes, No blurry vision, No hearing changes. No mouth, throat, or ear problems.  Respiratory: No cough, No SOB Cardiac: No CP, no palpitations GI:  No abd pain, No N/V/D. GU: No Urinary s/sx Musculoskeletal: No joint pain Neuro: No headache, no dizziness, no motor weakness.  Skin: No rash Endocrine:  No polydipsia. No polyuria.  Psych: Denies SI/HI  No problems updated.  ALLERGIES: Allergies  Allergen Reactions  . No Known Allergies     PAST MEDICAL HISTORY: Past Medical History:  Diagnosis Date  . Diabetes mellitus   . ESRD (end stage renal disease) ([place])   . Hyperlipidemia   . Hypertension   . Neuropathy    Diabetic  . Pneumonia 07/2016  . Renal disorder   . Sepsis ([place])   . Thyroid disease     MEDICATIONS AT HOME: Prior to Admission medications   Medication Sig Start Date End Date Taking? Authorizing Provider  Amino Acids-Protein Hydrolys (FEEDING SUPPLEMENT, PRO-STAT SUGAR FREE 64,) LIQD Take 30 mLs by mouth 2 (two) times daily. 02/17/18  Yes Elgergawy, Silver Huguenin, MD  atorvastatin (LIPITOR) 40 MG tablet Take 1 tablet (40 mg total) by mouth daily. 03/27/18  Yes [name], MD  bacitracin 500 UNIT/GM ointment Apply 1  application topically 2 (two) times daily. 03/27/18  Yes [name], MD  Blood Glucose Monitoring Suppl (TRUE METRIX METER) DEVI 1 each by Does not apply route 3 (three) times daily before meals. 07/25/17  Yes Newlin, Charlane Ferretti, MD  cetirizine (ZYRTEC) 10 MG tablet TAKE 1 TABLET BY MOUTH DAILY. 06/18/18  Yes [name], MD  DULoxetine (CYMBALTA) 60 MG capsule Take 1 capsule (60 mg total) by mouth daily. 03/27/18  Yes [name], MD  gabapentin (NEURONTIN) 300 MG capsule Take 1 capsule (300 mg total) by mouth at bedtime. 02/17/18  Yes Elgergawy, Silver Huguenin, MD  gabapentin (NEURONTIN) 300 MG capsule TAKE 2 CAPSULES (600 MG TOTAL) BY MOUTH AT BEDTIME. 03/29/18  Yes Newlin, [name], MD  glucose blood (TRUE METRIX BLOOD GLUCOSE TEST) test strip Use 3 times daily before meals 07/25/17  Yes Newlin, [name], MD  insulin glargine (LANTUS) 100 UNIT/ML injection Inject subcutaneously twice daily 20 units in the morning and 15 units in the evening 07/25/18  Yes , Dionne Bucy, PA-C  TRUEPLUS INSULIN SYRINGE 31G X 5/16" 1 ML MISC USE AS DIRECTED 07/22/18  Yes Newlin, [name], MD  TRUEPLUS LANCETS 28G MISC 1 each by Does not apply route 3 (three) times daily before meals. 07/25/17  Yes Newlin, Charlane Ferretti, MD  valsartan (DIOVAN) 160 MG tablet TAKE 1 TABLET (160 MG TOTAL) BY MOUTH DAILY. 04/01/18  Yes [name], MD     Objective:  EXAM:   Vitals:   07/25/18 1540  BP: 130/71  Pulse: 86  Temp: 97.9 F (36.6 C)  TempSrc: Oral  SpO2: 100%  Height: 5' (1.524 m)    General appearance : A&OX3. NAD. Non-toxic-appearing. In wheelchair HEENT: Atraumatic and Normocephalic.  PERRLA. EOM intact.  Neck: supple, no JVD. No cervical lymphadenopathy. No thyromegaly Chest/Lungs:  Breathing-non-labored, Good air entry bilaterally, breath sounds normal without rales, rhonchi, or wheezing  CVS: S1 S2 regular, no murmurs, gallops, rubs  Appropriate eye contact and affect.  Skin:  No Rash  Data Review Lab  Results  Component Value Date   HGBA1C 11.1 (A) 03/27/2018   HGBA1C 8.2 07/25/2017   HGBA1C 9.1 03/27/2017     Assessment & Plan   1. Insulin-requiring or dependent type II diabetes mellitus (HCC) Uncontrolled-A1C=10.8 - Glucose (CBG) - POCT glycosylated hemoglobin (Hb A1C) - insulin aspart (novoLOG) injection 25 Units now.  Glucose actually went up to over 400.  She will go ahead and dose lantus at the new dose once she gets home Increase dose- insulin glargine (LANTUS) 100 UNIT/ML injection; Inject subcutaneously twice daily 20 units in the morning and 15 units in the evening  Dispense: 30 mL; Refill: 5  2. Open fracture of tooth, initial encounter appears to be a broken bridge in denture.  Obtain name of procedure that is pending and bring form to next visit  3. Language barrier stratus interpreters used and additional time performing visit was required.  Patient have been counseled extensively about nutrition and exercise  Return in about 3 weeks (around 08/15/2018) for Dr Newlin-recheck DM and fill out dental form.  The patient was given clear instructions to go to ER or return to medical center if symptoms don't improve, worsen or new problems develop. The patient verbalized understanding. The patient was told to call to get lab results if they haven't heard anything in the next week.     Freeman Caldron, PA-C [facility] and [place], [place]   07/25/2018, 4:00 PM

## 2018-07-25 NOTE — Patient Instructions (Addendum)
Check blood sugar fasting and at bedtime and bring to next appt.  Increase lantus to 20 units in the morning and 10 units at night  Have the dentist office out the names of the possible procedures that may need to be done on the form

## 2018-07-26 MED FILL — VALSARTAN 160 MG TABLET: 160 | 30 days supply | Qty: 30 | Fill #0

## 2018-07-26 MED FILL — $LANTUS 100 UNITS/ML VIAL: 100 | 85 days supply | Qty: 30 | Fill #0

## 2018-08-13 ENCOUNTER — Ambulatory Visit: Payer: Self-pay | Admitting: Family Medicine

## 2018-08-14 ENCOUNTER — Ambulatory Visit: Payer: Self-pay | Attending: Family Medicine

## 2018-08-15 MED FILL — ?ATORVASTATIN 40MG TABLET: 40 | 30 days supply | Qty: 30 | Fill #4

## 2018-08-15 MED FILL — ?DULoxetine HCL 6OMG CP: 60 | 30 days supply | Qty: 30 | Fill #4

## 2018-08-15 MED FILL — ?CETIRIZINE HCL 10 MG TABLE: 10 | 30 days supply | Qty: 30 | Fill #1

## 2018-08-15 MED FILL — GABAPENTIN 300 MG CAPSULE: 300 | 30 days supply | Qty: 60 | Fill #4

## 2018-08-19 MED FILL — ETHYL CHLORIDE AERO: 30 days supply | Qty: 104 | Fill #7

## 2018-08-21 ENCOUNTER — Telehealth: Payer: Self-pay | Admitting: Family Medicine

## 2018-08-21 NOTE — Telephone Encounter (Signed)
Called patient on both phone numbers in regards to her Financial application, we are missing information to finalize her eligibility. LVM to call us back as soon as possible  -1 Utility Bill -Information on account ending in [phone]

## 2018-08-22 ENCOUNTER — Telehealth: Payer: Self-pay | Admitting: Family Medicine

## 2018-08-22 NOTE — Telephone Encounter (Signed)
Attempted to reach patient once more in regards to her financial application, LVM on daughters phone number and her main number again expressing the urgency that we speak to them in regards to patients application. To determine her eligibility, we are missing some documents.A letter will be sent out today if we do not hear from them stating all new paperwork will be needed and a new appointment.

## 2018-09-03 MED FILL — VALSARTAN 160 MG TABLET: 160 | 30 days supply | Qty: 30 | Fill #1

## 2018-09-05 ENCOUNTER — Other Ambulatory Visit (HOSPITAL_COMMUNITY): Payer: Self-pay | Admitting: Nephrology

## 2018-09-05 DIAGNOSIS — N186 End stage renal disease: Secondary | ICD-10-CM

## 2018-09-09 ENCOUNTER — Encounter: Payer: Self-pay | Admitting: Family Medicine

## 2018-09-09 ENCOUNTER — Ambulatory Visit: Payer: Self-pay | Attending: Family Medicine | Admitting: Family Medicine

## 2018-09-09 VITALS — BP 186/70 | HR 70 | Temp 98.2°F | Ht 60.0 in | Wt 151.2 lb

## 2018-09-09 DIAGNOSIS — R300 Dysuria: Secondary | ICD-10-CM

## 2018-09-09 DIAGNOSIS — E2839 Other primary ovarian failure: Secondary | ICD-10-CM

## 2018-09-09 DIAGNOSIS — Z0000 Encounter for general adult medical examination without abnormal findings: Secondary | ICD-10-CM

## 2018-09-09 DIAGNOSIS — I10 Essential (primary) hypertension: Secondary | ICD-10-CM

## 2018-09-09 DIAGNOSIS — E119 Type 2 diabetes mellitus without complications: Secondary | ICD-10-CM

## 2018-09-09 DIAGNOSIS — Z1239 Encounter for other screening for malignant neoplasm of breast: Secondary | ICD-10-CM

## 2018-09-09 DIAGNOSIS — Z0001 Encounter for general adult medical examination with abnormal findings: Secondary | ICD-10-CM

## 2018-09-09 DIAGNOSIS — Z1159 Encounter for screening for other viral diseases: Secondary | ICD-10-CM

## 2018-09-09 LAB — GLUCOSE, POCT (MANUAL RESULT ENTRY): POC Glucose: 81 mg/dl (ref 70–99)

## 2018-09-09 MED ORDER — CEPHALEXIN 500 MG PO CAPS: 500.0000 mg | ORAL_CAPSULE | Freq: Every day | ORAL | 0 refills | Status: DC

## 2018-09-09 MED FILL — CEPHALEXIN 500 MG CAPSULE: 500 | 7 days supply | Qty: 7 | Fill #0

## 2018-09-09 NOTE — Progress Notes (Signed)
Subjective:  Patient ID: Sarah Kelley, female    DOB: 1950/12/01  Age: 68 y.o. 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 She complains of intermittent dysuria but is unable to produce a urine specimen today as she urinates infrequently due to the fact that she is on hemodialysis. She also has intermittent abdominal pain but no nausea, vomiting or fever. Also complains of mild pedal edema.  Past Medical History:  Diagnosis Date  . Diabetes mellitus   . ESRD (end stage renal disease) ([place])   . Hyperlipidemia   . Hypertension   . Neuropathy    Diabetic  . Pneumonia 07/2016  . Renal disorder   . Sepsis ([place])   . Thyroid disease     Past Surgical History:  Procedure Laterality Date  . AMPUTATION Left 05/21/2015   Procedure: Left Foot 3rd Ray Amputation;  Surgeon: Newt Minion, MD;  Location: [place];  Service: Orthopedics;  Laterality: Left;  . AV FISTULA PLACEMENT Left 03/28/2016   Procedure: ARTERIOVENOUS (AV) FISTULA CREATION LEFT ARM;  Surgeon: Waynetta Sandy, MD;  Location: [facility];  Service: Vascular;  Laterality: Left;  . DILATION AND CURETTAGE OF UTERUS    . FISTULA SUPERFICIALIZATION Left 09/04/2016   Procedure: FISTULA SUPERFICIALIZATION LEFT UPPER ARM;  Surgeon: Waynetta Sandy, MD;  Location: [place];  Service: Vascular;  Laterality: Left;  . IR AV DIALY SHUNT INTRO NEEDLE/INTRAC INITIAL W/PTA/STENT/IMG LT Left 06/13/2017  . IR AV DIALY SHUNT INTRO NEEDLE/INTRAC INITIAL W/PTA/STENT/IMG LT Left 09/12/2017  . IR AV DIALY SHUNT INTRO NEEDLE/INTRACATH INITIAL  W/PTA/IMG LEFT  11/10/2016  . IR AV DIALY SHUNT INTRO NEEDLE/INTRACATH INITIAL W/PTA/IMG LEFT  03/09/2017  . IR AV DIALY SHUNT INTRO NEEDLE/INTRACATH INITIAL W/PTA/IMG LEFT  12/12/2017  . IR AV DIALY SHUNT INTRO NEEDLE/INTRACATH INITIAL W/PTA/IMG LEFT  02/27/2018  . IR AV DIALY SHUNT INTRO NEEDLE/INTRACATH INITIAL W/PTA/IMG LEFT  06/05/2018  . IR DIALY SHUNT INTRO NEEDLE/INTRACATH INITIAL W/IMG LEFT Left 05/08/2018  . IR GENERIC HISTORICAL  08/01/2016   IR FLUORO GUIDE CV LINE RIGHT 08/01/2016 [name], MD [facility]  . IR GENERIC HISTORICAL  08/01/2016   IR US GUIDE VASC ACCESS RIGHT 08/01/2016 [name], MD [facility]  . IR REMOVAL TUN CV CATH W/O FL  11/01/2016  . IR US GUIDE VASC ACCESS LEFT  11/10/2016  . IR US GUIDE VASC ACCESS LEFT  09/12/2017  . IR US GUIDE VASC ACCESS LEFT  12/12/2017  . IR US GUIDE VASC ACCESS LEFT  06/05/2018    History reviewed. No pertinent family history.  Allergies  Allergen Reactions  . No Known Allergies     Outpatient Medications Prior to Visit  Medication Sig Dispense Refill  . Amino Acids-Protein Hydrolys (FEEDING SUPPLEMENT, PRO-STAT SUGAR FREE 64,) LIQD Take 30 mLs by mouth 2 (two) times daily. 900 mL 0  . atorvastatin (LIPITOR) 40 MG tablet Take 1 tablet (40 mg total) by mouth daily. 30 tablet 5  . bacitracin 500 UNIT/GM ointment Apply 1 application topically 2 (two) times daily. 28 g 1  . Blood Glucose Monitoring Suppl (TRUE METRIX METER) DEVI 1 each by  Does not apply route 3 (three) times daily before meals. 1 Device 0  . cetirizine (ZYRTEC) 10 MG tablet TAKE 1 TABLET BY MOUTH DAILY. 30 tablet 2  . DULoxetine (CYMBALTA) 60 MG capsule Take 1 capsule (60 mg total) by mouth daily. 30 capsule 6  . gabapentin (NEURONTIN) 300 MG capsule TAKE 2 CAPSULES (600 MG TOTAL) BY MOUTH AT BEDTIME. 60 capsule 5  . glucose blood (TRUE METRIX BLOOD GLUCOSE TEST) test strip Use 3 times daily before meals 100 each 12  . insulin glargine (LANTUS) 100 UNIT/ML injection  Inject subcutaneously twice daily 20 units in the morning and 15 units in the evening 30 mL 5  . TRUEPLUS INSULIN SYRINGE 31G X 5/16" 1 ML MISC USE AS DIRECTED 100 each 5  . TRUEPLUS LANCETS 28G MISC 1 each by Does not apply route 3 (three) times daily before meals. 100 each 12  . valsartan (DIOVAN) 160 MG tablet TAKE 1 TABLET (160 MG TOTAL) BY MOUTH DAILY. 30 tablet 2  . gabapentin (NEURONTIN) 300 MG capsule Take 1 capsule (300 mg total) by mouth at bedtime. (Patient not taking: Reported on 09/09/2018) 60 capsule 5   Facility-Administered Medications Prior to Visit  Medication Dose Route Frequency Provider Last Rate Last Dose  . 0.9 %  sodium chloride infusion   Intravenous Continuous Monia Sabal, PA-C         ROS Review of Systems General: negative for fever, weight loss, appetite change Eyes: no visual symptoms. ENT: no ear symptoms, no sinus tenderness, no nasal congestion or sore throat. Neck: no pain  Respiratory: no wheezing, shortness of breath, cough Cardiovascular: no chest pain, no dyspnea on exertion, no pedal edema, no orthopnea. Gastrointestinal: no abdominal pain, no diarrhea, no constipation Genito-Urinary: no urinary frequency, + dysuria, no polyuria. Hematologic: no bruising Endocrine: no cold or heat intolerance Neurological: no headaches, no seizures, no tremors Musculoskeletal: no joint pains, no joint swelling Skin: no pruritus, no rash. Psychological: no depression, no anxiety,    Objective:  BP (!) 186/70   Pulse 70   Temp 98.2 F (36.8 C) (Oral)   Ht 5' (1.524 m)   Wt 151 lb 3.2 oz (68.6 kg)   LMP  (LMP Unknown)   SpO2 98%   BMI 29.53 kg/m   BP/Weight 09/09/2018 07/25/2018 58/0/9983  Systolic BP 382 505 397  Diastolic BP 70 71 58  Wt. (Lbs) 151.2 - 107.14  BMI 29.53 20.93 20.24      Physical Exam Constitutional: normal appearing,  Eyes: PERRLA HEENT: Head is atraumatic, normal sinuses, normal oropharynx, normal appearing tonsils and palate,  tympanic membrane is normal bilaterally. Neck: normal range of motion, no thyromegaly, no JVD Cardiovascular: normal rate and rhythm, normal heart sounds, no murmurs, rub or gallop, 1+ R dorsum non pitting pedal edema Respiratory: Normal breath sounds, clear to auscultation bilaterally, no wheezes, no rales, no rhonchi Breasts: Normal appearance, no tenderness, no masses Abdomen: soft, not tender to palpation, normal bowel sounds, no enlarged organs Musculoskeletal: Full ROM, no tenderness in joints, left arm AV fistula with palpable thrill Skin: warm and dry, no lesions. Neurological: alert, oriented x3, cranial nerves I-XII grossly intact , normal motor strength, normal sensation. Psychological: normal mood.   CMP Latest Ref Rng & Units 06/05/2018 02/16/2018 02/15/2018  Glucose 70 - 99 mg/dL 154(H) 147(H) 109(H)  BUN 8 - 23 mg/dL 38(H) 46(H) 24(H)  Creatinine 0.44 - 1.00 mg/dL 5.95(H) 6.84(H) 4.55(H)  Sodium 135 - 145 mmol/L 140 135 139  Potassium 3.5 - 5.1 mmol/L 4.5 4.2 4.0  Chloride 98 - 111 mmol/L 97(L) 95(L) 99  CO2 22 - 32 mmol/L 28 26 27   Calcium 8.9 - 10.3 mg/dL 9.2 8.7(L) 8.8(L)  Total Protein 6.5 - 8.1 g/dL - - -  Total Bilirubin 0.3 - 1.2 mg/dL - - -  Alkaline Phos 38 - 126 U/L - - -  AST 15 - 41 U/L - - -  ALT 0 - 44 U/L - - -    Lipid Panel     Component Value Date/Time   CHOL 246 (H) 01/13/2016 1051   TRIG 329 (H) 01/13/2016 1051   HDL 50 01/13/2016 1051   CHOLHDL 4.9 01/13/2016 1051   VLDL 66 (H) 01/13/2016 1051   LDLCALC 130 (H) 01/13/2016 1051    CBC    Component Value Date/Time   WBC 4.9 06/05/2018 0830   RBC 3.80 (L) 06/05/2018 0830   HGB 11.5 (L) 06/05/2018 0830   HCT 38.8 06/05/2018 0830   PLT 181 06/05/2018 0830   MCV 102.1 (H) 06/05/2018 0830   MCH 30.3 06/05/2018 0830   MCHC 29.6 (L) 06/05/2018 0830   RDW 15.2 06/05/2018 0830   LYMPHSABS 1.5 12/03/2016 0526   MONOABS 1.0 12/03/2016 0526   EOSABS 0.1 12/03/2016 0526   BASOSABS 0.0 12/03/2016  0526    Lab Results  Component Value Date   HGBA1C 10.8 (A) 07/25/2018    Assessment & Plan:   1. Annual physical exam Counseled on 150 minutes of exercise per week, healthy eating (including decreased daily intake of saturated fats, cholesterol, added sugars, sodium), STI prevention, routine healthcare maintenance. - Lipid panel  2. Diabetes mellitus without complication ([place]) Uncontrolled with A1c of 10.8 A1c addressed at previous visit Management of chronic conditions at next visit - POCT glucose (manual entry)  3. Estrogen deficiency - DG Bone Density; Future  4. Screening for breast cancer - MM Digital Screening; Future  5. Screening for viral disease - Hepatitis c antibody (reflex)  6. Dysuria - cephALEXin (KEFLEX) 500 MG capsule; Take 1 capsule (500 mg total) by mouth daily. Give after hemodialysis on dialysis days.  Dispense: 7 capsule; Refill: 0  7. Essential hypertension Uncontrolled due to not taking antihypertensives today Compliance emphasized along with low-sodium, DASH diet  Slight right dorsal edema noted.  Advised to cut back on sodium, elevate feet and avoid remaining in dependent position for prolonged periods  Meds ordered this encounter  Medications  . cephALEXin (KEFLEX) 500 MG capsule    Sig: Take 1 capsule (500 mg total) by mouth daily. Give after hemodialysis on dialysis days.    Dispense:  7 capsule    Refill:  0    Follow-up: Return in about 3 weeks (around 09/30/2018) for follow up of chronic medical conditions.       [name], MD, FAAFP. [facility] and [place], [place]   09/09/2018, 11:23 AM

## 2018-09-09 NOTE — Patient Instructions (Signed)
Mantenimiento de la salud despus de los 22 aos de edad Health Maintenance After Age 70 Despus de los 65 aos de edad, corre un riesgo mayor de Tourist information centre manager enfermedades e infecciones a Barrister's clerk, como tambin de sufrir lesiones por cadas. Las cadas son la causa principal de las fracturas de huesos y lesiones en la cabeza de personas mayores de 83 aos de edad. Recibir cuidados preventivos de forma regular puede ayudarlo a mantenerse saludable y en buen [place]. Los cuidados preventivos incluyen realizarse anlisis de forma regular y Actor en el estilo de vida segn las recomendaciones del mdico. Converse con el profesional que lo asiste sobre:  Las pruebas de deteccin y los anlisis que debe Dispensing optician. Una prueba de deteccin es un estudio que se para Hydrographic surveyor la presencia de una enfermedad cuando no tiene sntomas.  Un plan de dieta y ejercicios adecuado para usted. Qu debo saber sobre las pruebas de deteccin y los anlisis para prevenir cadas? Realizarse pruebas de deteccin y [employer] es la mejor manera de Hydrographic surveyor un problema de salud de forma temprana. El diagnstico y tratamiento tempranos le brindan la mejor oportunidad de Chief Technology Officer las afecciones mdicas que son comunes despus de los 77 aos de edad. Ciertas afecciones y elecciones de estilo de vida pueden hacer que sea ms propenso a sufrir Engineer, manufacturing. El mdico puede recomendarle lo siguiente:  Controles regulares de la visin. Una visin deficiente y afecciones como las cataratas pueden hacer que sea ms propenso a sufrir Engineer, manufacturing. Si [place] lentes, asegrese de obtener una receta actualizada si su visin cambia.  Revisin de medicamentos. Revise regularmente con el mdico todos los medicamentos que toma, incluidos los medicamentos de [place]. Consulte al [employer] efectos secundarios que pueden hacer que sea ms propenso a sufrir Engineer, manufacturing. Informe al mdico si alguno de los medicamentos que toma lo hace  sentir mareado o somnoliento.  Pruebas de deteccin para la osteoporosis. La osteoporosis es una afeccin que hace que los huesos se vuelvan ms frgiles. En consecuencia, los huesos pueden debilitarse y quebrarse ms fcilmente.  Pruebas de deteccin para la presin arterial. Los cambios en la presin arterial y los medicamentos para Chief Technology Officer la presin arterial pueden hacerlo sentir mareado.  Controles de fuerza y equilibrio. El mdico puede recomendar ciertos estudios para controlar su fuerza y equilibrio al estar de pie, al caminar o al cambiar de posicin.  Examen de los pies. El dolor y Chiropractor en los pies, como tambin no utilizar el calzado [place], pueden hacer que sea ms propenso a sufrir Engineer, manufacturing.  Prueba de deteccin de la depresin. Es ms probable que sufra una cada si tiene miedo a caerse, se siente mal emocionalmente o se siente incapaz de Patent examiner.  Prueba de deteccin de consumo de alcohol. Beber demasiado alcohol puede afectar su equilibrio y puede hacer que sea ms propenso a sufrir Engineer, manufacturing. Qu medidas puedo tomar para reducir mi riesgo de sufrir una cada? Instrucciones generales  Hable con el mdico sobre sus riesgos de sufrir una cada. Infrmele a su mdico si: ? Se cae. Asegrese de informarle a su mdico acerca de todas las cadas, incluso aquellas que parecen ser [employer] ? Se siente mareado, somnoliento o que pierde el equilibrio.  Tome los medicamentos de venta libre y los recetados solamente como se lo haya indicado el mdico. Estos incluyen todos los suplementos.  Siga una dieta sana y [place] un peso saludable. Una dieta saludable incluye  productos lcteos descremados, carnes bajas en contenido de grasa ([place], fibra de granos enteros, frijoles y [place] frutas y verduras. La seguridad en el hogar  Retire los objetos que puedan causar tropiezos tales como alfombras, cables u obstculos.  Instale equipos de  seguridad, como barras para sostn en los baos y barandas de seguridad en las escaleras.  [place] habitaciones y los pasillos bien iluminados. Actividad   Siga un programa de ejercicio regular para mantenerse en forma. Esto lo ayudar a Contractor equilibrio. Consulte al mdico qu tipos de ejercicios son adecuados para usted.  Si necesita un bastn o un andador, selo segn las recomendaciones del mdico.  Utilice calzado con buen apoyo y suela antideslizante. Estilo de vida  No beba alcohol si el mdico le indica que no beba.  Si bebe alcohol, limite la cantidad que consume: ? De 0 a 1 medida por da para las mujeres. ? De 0 a 2 medidas por da para los hombres.  Est atento a la cantidad de alcohol que contiene su bebida. En los [place] una medida equivale a una botella tpica de cerveza (12 onzas), media copa de vino (5 onzas) o una medida de bebida blanca (1 onza).  No consuma ningn producto que contenga nicotina o tabaco, como cigarrillos y Psychologist, sport and exercise. Si necesita ayuda para dejar de fumar, consulte al mdico. Resumen  Tener un estilo de vida saludable y recibir cuidados preventivos pueden ayudar a Theatre stage manager salud y el bienestar despus de los 73 aos de [place].  Realizarse pruebas de deteccin y [employer] es la mejor manera de Hydrographic surveyor un problema de salud de forma temprana y Lourena Simmonds a Product/process development scientist una cada. El diagnstico y tratamiento tempranos le brindan la mejor oportunidad de Chief Technology Officer las afecciones mdicas ms comunes en las personas mayores de 51 aos de edad.  Las cadas son la causa principal de las fracturas de huesos y lesiones en la cabeza de personas mayores de 36 aos de edad. Tome precauciones para evitar una cada en su casa.  Trabaje con el mdico para saber qu cambios que puede hacer para mejorar su salud y [place], y [place]. Esta informacin no tiene [name] el consejo del mdico. Asegrese de hacerle al  mdico cualquier pregunta que tenga. Document Released: 08/02/2017 Document Revised: 08/02/2017 Document Reviewed: 08/02/2017 Elsevier Interactive Patient Education  2019 [name].

## 2018-09-09 NOTE — Progress Notes (Signed)
Patient is having abdominal pain, and eye pain.  Patient is having swelling in right foot.

## 2018-09-10 ENCOUNTER — Other Ambulatory Visit: Payer: Self-pay | Admitting: Student

## 2018-09-11 ENCOUNTER — Other Ambulatory Visit (HOSPITAL_COMMUNITY): Payer: Self-pay | Admitting: Nephrology

## 2018-09-11 ENCOUNTER — Other Ambulatory Visit: Payer: Self-pay

## 2018-09-11 ENCOUNTER — Ambulatory Visit (HOSPITAL_COMMUNITY)
Admission: RE | Admit: 2018-09-11 | Discharge: 2018-09-11 | Disposition: A | Discharge status: Home / Self Care | Payer: Self-pay | Source: Ambulatory Visit | Attending: Nephrology | Admitting: Nephrology

## 2018-09-11 ENCOUNTER — Encounter (HOSPITAL_COMMUNITY): Payer: Self-pay | Admitting: Interventional Radiology

## 2018-09-11 DIAGNOSIS — N186 End stage renal disease: Secondary | ICD-10-CM | POA: Insufficient documentation

## 2018-09-11 DIAGNOSIS — Z992 Dependence on renal dialysis: Secondary | ICD-10-CM | POA: Insufficient documentation

## 2018-09-11 DIAGNOSIS — E785 Hyperlipidemia, unspecified: Secondary | ICD-10-CM | POA: Insufficient documentation

## 2018-09-11 DIAGNOSIS — E079 Disorder of thyroid, unspecified: Secondary | ICD-10-CM | POA: Insufficient documentation

## 2018-09-11 DIAGNOSIS — E1140 Type 2 diabetes mellitus with diabetic neuropathy, unspecified: Secondary | ICD-10-CM | POA: Insufficient documentation

## 2018-09-11 DIAGNOSIS — I120 Hypertensive chronic kidney disease with stage 5 chronic kidney disease or end stage renal disease: Secondary | ICD-10-CM | POA: Insufficient documentation

## 2018-09-11 DIAGNOSIS — Z794 Long term (current) use of insulin: Secondary | ICD-10-CM | POA: Insufficient documentation

## 2018-09-11 DIAGNOSIS — Z79899 Other long term (current) drug therapy: Secondary | ICD-10-CM | POA: Insufficient documentation

## 2018-09-11 DIAGNOSIS — Z89422 Acquired absence of other left toe(s): Secondary | ICD-10-CM | POA: Insufficient documentation

## 2018-09-11 DIAGNOSIS — E1122 Type 2 diabetes mellitus with diabetic chronic kidney disease: Secondary | ICD-10-CM | POA: Insufficient documentation

## 2018-09-11 HISTORY — PX: IR US GUIDE VASC ACCESS LEFT: IMG2389

## 2018-09-11 HISTORY — PX: IR AV DIALY SHUNT INTRO NEEDLE/INTRACATH INITIAL W/PTA/IMG LEFT: IMG6103

## 2018-09-11 LAB — BASIC METABOLIC PANEL
Anion gap: 11 (ref 5–15)
BUN: 31 mg/dL — ABNORMAL HIGH (ref 8–23)
CO2: 30 mmol/L (ref 22–32)
Calcium: 9.4 mg/dL (ref 8.9–10.3)
Chloride: 98 mmol/L (ref 98–111)
Creatinine, Ser: 6.34 mg/dL — ABNORMAL HIGH (ref 0.44–1.00)
GFR calc Af Amer: 7 mL/min — ABNORMAL LOW (ref >60)
GFR, EST NON AFRICAN AMERICAN: 6 mL/min — AB (ref >60)
Glucose, Bld: 76 mg/dL (ref 70–99)
Potassium: 5.4 mmol/L — ABNORMAL HIGH (ref 3.5–5.1)
Sodium: 139 mmol/L (ref 135–145)

## 2018-09-11 LAB — HEPATITIS C ANTIBODY (REFLEX): HCV Ab: <0.1 s/co ratio (ref 0.0–0.9)

## 2018-09-11 LAB — LIPID PANEL
Chol/HDL Ratio: 3.9 ratio (ref 0.0–4.4)
Cholesterol, Total: 171 mg/dL (ref 100–199)
HDL: 44 mg/dL (ref >39)
LDL Calculated: 82 mg/dL (ref 0–99)
Triglycerides: 223 mg/dL — ABNORMAL HIGH (ref 0–149)
VLDL CHOLESTEROL CAL: 45 mg/dL — AB (ref 5–40)

## 2018-09-11 LAB — CBC
HEMATOCRIT: 36.9 % (ref 36.0–46.0)
HEMOGLOBIN: 11.9 g/dL — AB (ref 12.0–15.0)
MCH: 31.8 pg (ref 26.0–34.0)
MCHC: 32.2 g/dL (ref 30.0–36.0)
MCV: 98.7 fL (ref 80.0–100.0)
Platelets: UNDETERMINED 10*3/uL (ref 150–400)
RBC: 3.74 MIL/uL — ABNORMAL LOW (ref 3.87–5.11)
RDW: 14.7 % (ref 11.5–15.5)
WBC: 6.6 10*3/uL (ref 4.0–10.5)
nRBC: 0 % (ref 0.0–0.2)

## 2018-09-11 LAB — HCV COMMENT:

## 2018-09-11 LAB — GLUCOSE, CAPILLARY: Glucose-Capillary: 83 mg/dL (ref 70–99)

## 2018-09-11 MED ORDER — FENTANYL CITRATE (PF) 100 MCG/2ML IJ SOLN
INTRAMUSCULAR | Status: AC | PRN
  Administered 2018-09-11: 25 ug via INTRAVENOUS

## 2018-09-11 MED ORDER — FENTANYL CITRATE (PF) 100 MCG/2ML IJ SOLN
INTRAMUSCULAR | Status: AC
  Filled 2018-09-11: qty 2

## 2018-09-11 MED ORDER — MIDAZOLAM HCL 2 MG/2ML IJ SOLN
INTRAMUSCULAR | Status: AC | PRN
  Administered 2018-09-11: 1 mg via INTRAVENOUS

## 2018-09-11 MED ORDER — LIDOCAINE HCL 1 % IJ SOLN
INTRAMUSCULAR | Status: AC | PRN
  Administered 2018-09-11: 5 mL

## 2018-09-11 MED ORDER — MIDAZOLAM HCL 2 MG/2ML IJ SOLN
INTRAMUSCULAR | Status: AC
  Filled 2018-09-11: qty 2

## 2018-09-11 MED ORDER — SODIUM CHLORIDE 0.9 % IV SOLN: INTRAVENOUS | Status: DC

## 2018-09-11 MED ORDER — HEPARIN SODIUM (PORCINE) 1000 UNIT/ML IJ SOLN
INTRAMUSCULAR | Status: AC
  Filled 2018-09-11: qty 1

## 2018-09-11 MED ORDER — IOPAMIDOL (ISOVUE-300) INJECTION 61%
INTRAVENOUS | Status: AC
  Administered 2018-09-11: 40 mL
  Filled 2018-09-11: qty 100

## 2018-09-11 MED ORDER — LIDOCAINE HCL 1 % IJ SOLN
INTRAMUSCULAR | Status: AC
  Filled 2018-09-11: qty 20

## 2018-09-11 NOTE — H&P (Signed)
Chief Complaint: Patient was seen in consultation today for fistulagram with possible intervention.  Referring Physician(s): Coladonato,Joseph  Supervising Physician: [name]  Patient Status: [facility] - Out-pt  History of Present Illness: Sarah Kelley is a 68 y.o. female with a past medical history significant for DM, HLD, HTN, neuropathy and ESRD on dialysis via left AVF T/Th/S with last successful dialysis yesterday who presents today for fistulagram with possible intervention. Patient well known to IR service due to several previous fistulagrams with intervention, most recently 06/05/18. She presents today due to reported low access flow during dialysis.  Patient is spanish speaking only and she is unable to read or write per her son who is at bedside with her today. All history obtained from patient and son via interpreter today. She states that she is here for a "fistula procedure" but she does not know why. She does not know of any problems with her fistula - she denies pain, bleeding or swelling around her fistula. She does complain of right arm itching due to insulin administration as well as chronic abdominal pain. Her son is also asking about dialysis access that can be done at home without needle pricking because he states that he knows someone who has this and believes his mom would benefit from it.    Past Medical History:  Diagnosis Date  . Diabetes mellitus   . ESRD (end stage renal disease) ([place])   . Hyperlipidemia   . Hypertension   . Neuropathy    Diabetic  . Pneumonia 07/2016  . Renal disorder   . Sepsis ([place])   . Thyroid disease     Past Surgical History:  Procedure Laterality Date  . AMPUTATION Left 05/21/2015   Procedure: Left Foot 3rd Ray Amputation;  Surgeon: Newt Minion, MD;  Location: [place];  Service: Orthopedics;  Laterality: Left;  . AV FISTULA PLACEMENT Left 03/28/2016   Procedure: ARTERIOVENOUS (AV) FISTULA CREATION LEFT ARM;  Surgeon:  Waynetta Sandy, MD;  Location: [place];  Service: Vascular;  Laterality: Left;  . DILATION AND CURETTAGE OF UTERUS    . FISTULA SUPERFICIALIZATION Left 09/04/2016   Procedure: FISTULA SUPERFICIALIZATION LEFT UPPER ARM;  Surgeon: Waynetta Sandy, MD;  Location: [place];  Service: Vascular;  Laterality: Left;  . IR AV DIALY SHUNT INTRO NEEDLE/INTRAC INITIAL W/PTA/STENT/IMG LT Left 06/13/2017  . IR AV DIALY SHUNT INTRO NEEDLE/INTRAC INITIAL W/PTA/STENT/IMG LT Left 09/12/2017  . IR AV DIALY SHUNT INTRO NEEDLE/INTRACATH INITIAL W/PTA/IMG LEFT  11/10/2016  . IR AV DIALY SHUNT INTRO NEEDLE/INTRACATH INITIAL W/PTA/IMG LEFT  03/09/2017  . IR AV DIALY SHUNT INTRO NEEDLE/INTRACATH INITIAL W/PTA/IMG LEFT  12/12/2017  . IR AV DIALY SHUNT INTRO NEEDLE/INTRACATH INITIAL W/PTA/IMG LEFT  02/27/2018  . IR AV DIALY SHUNT INTRO NEEDLE/INTRACATH INITIAL W/PTA/IMG LEFT  06/05/2018  . IR DIALY SHUNT INTRO NEEDLE/INTRACATH INITIAL W/IMG LEFT Left 05/08/2018  . IR GENERIC HISTORICAL  08/01/2016   IR FLUORO GUIDE CV LINE RIGHT 08/01/2016 [name], MD [facility]-INTER[facility]  . IR GENERIC HISTORICAL  08/01/2016   IR US GUIDE VASC ACCESS RIGHT 08/01/2016 [name], MD [facility]  . IR REMOVAL TUN CV CATH W/O FL  11/01/2016  . IR US GUIDE VASC ACCESS LEFT  11/10/2016  . IR US GUIDE VASC ACCESS LEFT  09/12/2017  . IR US GUIDE VASC ACCESS LEFT  12/12/2017  . IR US GUIDE VASC ACCESS LEFT  06/05/2018    Allergies: No known allergies  Medications: Prior to Admission medications  Medication Sig Start Date End Date Taking? Authorizing Provider  atorvastatin (LIPITOR) 40 MG tablet Take 1 tablet (40 mg total) by mouth daily. 03/27/18  Yes [name], MD  cephALEXin (KEFLEX) 500 MG capsule Take 1 capsule (500 mg total) by mouth daily. Give after hemodialysis on dialysis days. 09/09/18  Yes [name], MD  cetirizine (ZYRTEC) 10 MG tablet TAKE 1 TABLET BY MOUTH DAILY. 06/18/18  Yes [name], MD  DULoxetine (CYMBALTA)  60 MG capsule Take 1 capsule (60 mg total) by mouth daily. 03/27/18  Yes [name], MD  insulin glargine (LANTUS) 100 UNIT/ML injection Inject subcutaneously twice daily 20 units in the morning and 15 units in the evening 07/25/18  Yes McClung, Angela M, PA-C  valsartan (DIOVAN) 160 MG tablet TAKE 1 TABLET (160 MG TOTAL) BY MOUTH DAILY. 07/26/18  Yes [name], MD  Amino Acids-Protein Hydrolys (FEEDING SUPPLEMENT, PRO-STAT SUGAR FREE 64,) LIQD Take 30 mLs by mouth 2 (two) times daily. 02/17/18   Elgergawy, Silver Huguenin, MD  bacitracin 500 UNIT/GM ointment Apply 1 application topically 2 (two) times daily. 03/27/18   [name], MD  Blood Glucose Monitoring Suppl (TRUE METRIX METER) DEVI 1 each by Does not apply route 3 (three) times daily before meals. 07/25/17   [name], MD  gabapentin (NEURONTIN) 300 MG capsule Take 1 capsule (300 mg total) by mouth at bedtime. Patient not taking: Reported on 09/09/2018 02/17/18   Elgergawy, Silver Huguenin, MD  gabapentin (NEURONTIN) 300 MG capsule TAKE 2 CAPSULES (600 MG TOTAL) BY MOUTH AT BEDTIME. 03/29/18   [name], MD  glucose blood (TRUE METRIX BLOOD GLUCOSE TEST) test strip Use 3 times daily before meals 07/25/17   [name], MD  TRUEPLUS INSULIN SYRINGE 31G X 5/16" 1 ML MISC USE AS DIRECTED 07/22/18   [name], MD  TRUEPLUS LANCETS 28G MISC 1 each by Does not apply route 3 (three) times daily before meals. 07/25/17   [name], MD     No family history on file.  Social History   Socioeconomic History  . Marital status: Widowed    Spouse name: Not on file  . Number of children: Not on file  . Years of education: Not on file  . Highest education level: Not on file  Occupational History  . Not on file  Social Needs  . Financial resource strain: Not on file  . Food insecurity:    Worry: Not on file    Inability: Not on file  . Transportation needs:    Medical: Not on file    Non-medical: Not on file  Tobacco Use  .  Smoking status: Never Smoker  . Smokeless tobacco: Never Used  Substance and Sexual Activity  . Alcohol use: No    Alcohol/week: 0.0 standard drinks    Comment: rare- a beer every now and then  . Drug use: No  . Sexual activity: Not Currently  Lifestyle  . Physical activity:    Days per week: Not on file    Minutes per session: Not on file  . Stress: Not on file  Relationships  . Social connections:    Talks on phone: Not on file    Gets together: Not on file    Attends religious service: Not on file    Active member of club or organization: Not on file    Attends meetings of clubs or organizations: Not on file    Relationship status: Not on file  Other Topics Concern  .  Not on file  Social History Narrative  . Not on file     Review of Systems: A 12 point ROS discussed and pertinent positives are indicated in the HPI above.  All other systems are negative.  Review of Systems  Constitutional: Positive for fatigue. Negative for chills and fever.  Respiratory: Negative for cough and shortness of breath.   Cardiovascular: Negative for chest pain.  Gastrointestinal: Positive for abdominal pain and nausea. Negative for diarrhea and vomiting.  Musculoskeletal: Positive for back pain.  Skin: Negative for color change.  Neurological: Negative for dizziness, syncope and headaches.    Vital Signs: BP 123/81   Pulse 73   Temp 98.5 F (36.9 C) (Oral)   Resp 16   Ht 5' (1.524 m)   Wt 147 lb 11.3 oz (67 kg)   LMP  (LMP Unknown)   SpO2 100%   BMI 28.85 kg/m   Physical Exam Vitals signs reviewed.  Constitutional:      General: She is not in acute distress.    Comments: Son and interpreter present during exam. Patient is somewhat of a poor historian, son able to provide supplemental history.   HENT:     Head: Normocephalic.  Cardiovascular:     Rate and Rhythm: Normal rate and regular rhythm.     Comments: (+) left AVF (+) thrill, (+) bruit. Two small areas of bruising  with scabs present to fistula. No erythema, edema or pain on palpation.  Pulmonary:     Effort: Pulmonary effort is normal.     Breath sounds: Normal breath sounds.  Abdominal:     General: There is no distension.     Palpations: Abdomen is soft.     Tenderness: There is no abdominal tenderness.  Skin:    General: Skin is warm and dry.  Neurological:     Mental Status: She is alert and oriented to person, place, and time.  Psychiatric:        Mood and Affect: Mood normal.        Behavior: Behavior normal.        Thought Content: Thought content normal.        Judgment: Judgment normal.      MD Evaluation Airway: WNL(top and bottom dentures) Heart: WNL Abdomen: WNL Chest/ Lungs: WNL ASA  Classification: 3 Mallampati/Airway Score: Two   Imaging: No results found.  Labs:  CBC: Recent Labs    02/15/18 0527 02/16/18 0442 05/08/18 0933 06/05/18 0830  WBC 6.4 6.4 6.4 4.9  HGB 11.3* 10.0* 10.7* 11.5*  HCT 34.7* 31.7* 33.6* 38.8  PLT 113* 178 160 181    COAGS: Recent Labs    05/08/18 0933 06/05/18 0830  INR 1.15 1.14  APTT 34  --     BMP: Recent Labs    02/14/18 0450 02/15/18 0527 02/16/18 0442 06/05/18 0830  NA 133* 139 135 140  K 3.8 4.0 4.2 4.5  CL 92* 99 95* 97*  CO2 24 27 26 28   GLUCOSE 53* 109* 147* 154*  BUN 61* 24* 46* 38*  CALCIUM 8.6* 8.8* 8.7* 9.2  CREATININE 7.45* 4.55* 6.84* 5.95*  GFRNONAA 5* 9* 6* 7*  GFRAA 6* 10* 6* 8*    LIVER FUNCTION TESTS: Recent Labs    02/10/18 2106 02/11/18 0940 02/12/18 0418  BILITOT 0.8 0.7  --   AST 24 24  --   ALT 18 17  --   ALKPHOS 137* 106  --   PROT 6.9 6.5  --  ALBUMIN 3.3* 2.8* 2.5*    TUMOR MARKERS: No results for input(s): AFPTM, CEA, CA199, CHROMGRNA in the last 8760 hours.  Assessment and Plan:  68 y/o F with ESRD on HD T/Th/S via left AVF well known to IR service due to previous fistulagrams who presents today for repeat fistulagram with possible intervention due to low flow  during dialysis.  Patient has been NPO since midnight, she does not take blood thinning medications. Afebrile, WBC 6.6, hgb 11.9, plt unable to be determined due to clumping, INR pending at time of this note writing, creatinine 6.34, K+ 5.4.  Risks and benefits discussed with the patient including, but not limited to bleeding, infection, vascular injury, pulmonary embolism, need for tunneled HD catheter placement or even death.  All of the patient's questions were answered, patient is agreeable to proceed.  Consent signed and in chart.  Thank you for this interesting consult.  I greatly enjoyed meeting [facility] and look forward to participating in their care.  A copy of this report was sent to the requesting provider on this date.  Electronically Signed: Joaquim Nam, PA-C 09/11/2018, 8:50 AM   I spent a total of 15 Minutes in face to face in clinical consultation, greater than 50% of which was counseling/coordinating care for fistulagram.

## 2018-09-11 NOTE — Procedures (Signed)
Pre Procedure Dx: ESRD Post Procedure Dx: Same  Technically successful fistulogram with angioplasty.    EBL: Minimal  No immediate complications.  Jay Jomari Bartnik, MD Pager #: [phone]  

## 2018-09-11 NOTE — Discharge Instructions (Signed)
Fistulografa de dilisis, cuidados posteriores Dialysis Fistulogram, Care After Esta hoja le proporciona informacin sobre cmo cuidarse despus del procedimiento. Su mdico tambin podr darle instrucciones ms especficas. Comunquese con su mdico si tiene problemas o preguntas. Qu puedo esperar despus del procedimiento? Despus del procedimiento, es comn [employer] siguientes sntomas:  Una pequea molestia en la zona en la que se coloc el tubo delgado y pequeo (catter) para el procedimiento.  Un pequeo hematoma alrededor de la fstula.  Somnolencia y cansancio Company secretary). Siga estas indicaciones en su casa: Actividad   Haga reposo en su casa y no levante objetos que pesen ms de 5 lb (2,3 kg) el da despus del procedimiento.  Reanude sus actividades normales segn lo indicado por el mdico. Pregntele al mdico qu actividades son seguras para usted.  No conduzca ni use maquinaria pesada mientras toma analgsicos recetados.  No conduzca durante 24horas si recibi un medicamento para ayudarlo a relajarse (sedante) durante el procedimiento. Medicamentos   [employer] de venta libre y los recetados solamente como se lo haya indicado el mdico. Cuidado del Environmental consultant de la puncin  Siga las indicaciones de su mdico acerca de cmo Government social research officer donde se insertaron los catteres. Asegrese de hacer lo siguiente: ? Lvese las manos con agua y jabn antes de [place]ar[place] manager las vendas (vendaje). Use desinfectante para manos si no dispone de [place]. ? Cambie el vendaje como se lo haya indicado el mdico. ? No retire los puntos (suturas), la goma para cerrar la piel o las tiras [place]. Es posible que estos cierres cutneos Animal nutritionist en la piel durante 2semanas o ms. Si los bordes de las tiras adhesivas empiezan a despegarse y Therapist, sports, puede recortar los que estn sueltos. No retire las tiras [facility] por completo a menos que el mdico se lo indique.  Controle  la zona de la puncin todos los das para detectar signos de infeccin. Est atento a los siguientes signos: ? Dolor, hinchazn o enrojecimiento. ? Lquido o sangre. ? Calor. ? Pus o mal olor. Instrucciones generales  No tome baos de inmersin, no nade ni use el jacuzzi hasta que el mdico lo autorice. Pregntele al mdico si puede ducharse. Thurston Pounds solo le permitan darse baos de [place].  Controle atentamente la fstula de dilisis. Verifique para asegurarse de que puede sentir una vibracin o un zumbido (frmito) cuando Risk manager los dedos sobre la fstula.  Evite daar el injerto o la fstula: ? No use ropa ajustada ni joyas en el brazo o la pierna que tiene el injerto o la fstula. ? Informe a todos sus mdicos que tiene un injerto o una fstula para dilisis. ? No permita extracciones de sangre, terapias intravenosas ni lecturas de la presin arterial en el brazo que tiene la fstula o el injerto. ? No permita que le apliquen vacunas antigripales ni otras vacunas en el brazo con la fstula o el injerto.  Concurra a todas las visitas de seguimiento como se lo haya indicado el mdico. Esto es importante. Comunquese con un mdico si:  Tiene enrojecimiento, hinchazn o dolor en el lugar donde se insert el catter.  Observa lquido o sangre que proviene del lugar de la insercin del catter.  El lugar de la insercin del catter est caliente al tacto.  Tiene pus o percibe mal olor que proviene del lugar de la insercin del catter.  Tiene fiebre o siente escalofros. Solicite ayuda de inmediato si:  Se siente dbil.  Tiene problemas de equilibrio.  Tiene dificultad para mover los brazos o las piernas.  Tiene problemas visuales o para hablar.  Ya no puede sentir una vibracin o un zumbido cuando [employer] dedos sobre la fstula de dilisis.  La extremidad que se [place] para el procedimiento: ? Se hincha. ? Duele. ? Est fra. ? Cambia de color, por ejemplo, se torna  azulada o blanco plido.  Siente falta de aire o Tourist information centre manager. Resumen  Despus de [place] fistulografa de dilisis, es comn tener una pequea molestia o algunos moretones en la zona donde se coloc el tubo delgado y pequeo (catter).  Descanse en su [place] despus del procedimiento. Reanude sus actividades normales segn lo indicado por el mdico.  [employer] de venta libre y los recetados solamente como se lo haya indicado el mdico.  Siga las indicaciones de su mdico acerca de cmo Government social research officer donde se insert el catter.  Concurra a todas las visitas de seguimiento como se lo haya indicado el mdico. Esta informacin no tiene [name] el consejo del mdico. Asegrese de hacerle al mdico cualquier pregunta que tenga. Document Released: 11/03/2013 Document Revised: 08/22/2017 Document Reviewed: 08/22/2017 Elsevier Interactive Patient Education  2019 [place] consciente moderada en los adultos, cuidados posteriores (Moderate Conscious Sedation, Adult, Care After) Estas indicaciones le proporcionan informacin acerca de cmo deber cuidarse despus del procedimiento. El mdico tambin podr darle instrucciones ms especficas. El tratamiento ha sido planificado segn las prcticas mdicas actuales, pero en algunos casos pueden ocurrir problemas. Comunquese con el mdico si tiene algn problema o dudas despus del procedimiento. QU ESPERAR DESPUS DEL PROCEDIMIENTO Despus del procedimiento, es comn:  Sentirse somnoliento durante varias horas.  Sentirse torpe y [employer] de equilibrio durante varias horas.  Perder el sentido de la realidad durante varias horas.  Vomitar si come [employer]. INSTRUCCIONES PARA EL CUIDADO EN EL HOGAR  Durante al menos 24horas despus del procedimiento:  No haga lo siguiente: ? Participar en actividades que impliquen posibles cadas o lesiones. ? Conducir vehculos. ? Operar maquinarias  pesadas. ? Beber alcohol. ? Tomar somnferos o medicamentos que causen somnolencia. ? Firmar documentos legales ni tomar [employer]. ? Cuidar a nios por su cuenta.  Hacer reposo. Comida y bebida  Siga la dieta recomendada por el mdico.  Si vomita: ? Pruebe agua, jugo o sopa cuando usted pueda beber sin vomitar. ? Asegrese de no tener nuseas antes de ingerir alimentos slidos. Instrucciones generales  Permanezca con un adulto responsable hasta que est completamente despierto y consciente.  Tome los medicamentos de venta libre y los recetados solamente como se lo haya indicado el mdico.  Si fuma, no lo haga sin supervisin.  Concurra a todas las visitas de control como se lo haya indicado el mdico. Esto es importante. SOLICITE ATENCIN MDICA SI:  Sigue teniendo nuseas o vomitando.  Tiene sensacin de desvanecimiento.  Le aparece una erupcin cutnea.  Tiene fiebre. SOLICITE ATENCIN MDICA DE INMEDIATO SI:  Tiene dificultad para respirar. Esta informacin no tiene [name] el consejo del mdico. Asegrese de hacerle al mdico cualquier pregunta que tenga. Document Released: 06/24/2013 Document Revised: 07/10/2014 Document Reviewed: 10/09/2015 Elsevier Interactive Patient Education  2019 [name].

## 2018-09-11 NOTE — Sedation Documentation (Signed)
Patient alert/oriented and ready to discharge home. Patient accompained by son. Discharge instructions and AVS reviewed with patient and son, both state understanding without any further questions or concerns. Vitals signs stable. IV d/c'd

## 2018-09-16 MED FILL — GABAPENTIN 300 MG CAPSULE: 300 | 30 days supply | Qty: 60 | Fill #5

## 2018-09-16 MED FILL — ?CETIRIZINE HCL 10 MG TABLE: 10 | 30 days supply | Qty: 30 | Fill #2

## 2018-09-16 MED FILL — ?DULoxetine HCL 6OMG CP: 60 | 30 days supply | Qty: 30 | Fill #5

## 2018-09-16 MED FILL — TRUEPLUS SYR 1ML 31GX5/16": 31G X 5/16" | 25 days supply | Qty: 100 | Fill #1

## 2018-09-16 MED FILL — TRUEPLUS SYR 1ML 31GX5/16: 31G X 5/16" | 25 days supply | Qty: 100 | Fill #1

## 2018-09-16 MED FILL — ?ATORVASTATIN 40MG TABLET: 40 | 30 days supply | Qty: 30 | Fill #5

## 2018-10-14 ENCOUNTER — Other Ambulatory Visit: Payer: Self-pay | Admitting: Family Medicine

## 2018-10-14 DIAGNOSIS — R300 Dysuria: Secondary | ICD-10-CM

## 2018-10-14 DIAGNOSIS — E1149 Type 2 diabetes mellitus with other diabetic neurological complication: Secondary | ICD-10-CM

## 2018-10-14 DIAGNOSIS — E7800 Pure hypercholesterolemia, unspecified: Secondary | ICD-10-CM

## 2018-10-14 MED FILL — ?DULoxetine HCL 6OMG CP: 60 | 30 days supply | Qty: 30 | Fill #6

## 2018-10-15 ENCOUNTER — Telehealth: Payer: Self-pay | Admitting: Pharmacist

## 2018-10-15 MED FILL — ?ATORVASTATIN 40MG TABLET: 40 | 30 days supply | Qty: 30 | Fill #0

## 2018-10-15 MED FILL — ?CETIRIZINE HCL 10 MG TABLE: 10 | 30 days supply | Qty: 30 | Fill #0

## 2018-10-15 NOTE — Telephone Encounter (Signed)
Received notice from [facility] pharmacy that valsartan is on backorder. Multiple ARBs are now on backorder or difficult for the pharmacy to receive. Will forward information to patient's PCP to consider perhaps and ACEi or medication from a different class.

## 2018-10-16 MED ORDER — LISINOPRIL 20 MG PO TABS: 20.0000 mg | ORAL_TABLET | Freq: Every day | ORAL | 1 refills | Status: DC

## 2018-10-16 MED FILL — GABAPENTIN 300 MG CAPSULE: 300 | 30 days supply | Qty: 60 | Fill #0

## 2018-10-16 MED FILL — CEPHALEXIN 500 MG CAPSULE: 500 | 7 days supply | Qty: 7 | Fill #0

## 2018-10-16 NOTE — Telephone Encounter (Signed)
Switched to lisinopril

## 2018-10-16 NOTE — Addendum Note (Signed)
Addended by: [name] on: 10/16/2018 05:20 PM   Modules accepted: Orders

## 2018-10-17 ENCOUNTER — Telehealth: Payer: Self-pay | Admitting: Family Medicine

## 2018-10-17 MED FILL — LISINOPRIL 20 MG TAB: 20 | 90 days supply | Qty: 90 | Fill #0

## 2018-10-17 NOTE — Telephone Encounter (Signed)
1) Medication(s) Requested (by name): Spray for dialysis 2) Pharmacy of Choice: [facility] 3) Special Requests:   Approved medications will be sent to the pharmacy, we will reach out if there is an issue.  Requests made after 3pm may not be addressed until the following business day!  If a patient is unsure of the name of the medication(s) please note and ask patient to call back when they are able to provide all info, do not send to responsible party until all information is available!

## 2018-10-17 NOTE — Telephone Encounter (Signed)
Will route to PCP for review. 

## 2018-10-18 NOTE — Telephone Encounter (Signed)
She will have to obtain that from her Nephrologist at Dialysis. I have no idea of what that is

## 2018-10-18 NOTE — Telephone Encounter (Signed)
Patient was informed of message 

## 2018-10-24 MED FILL — TRUEPLUS SYR 1ML 31GX5/16": 31G X 5/16" | 25 days supply | Qty: 100 | Fill #2

## 2018-10-24 MED FILL — TRUEPLUS SYR 1ML 31GX5/16: 31G X 5/16" | 25 days supply | Qty: 100 | Fill #2

## 2018-11-15 MED FILL — GABAPENTIN 300 MG CAPSULE: 300 | 30 days supply | Qty: 60 | Fill #1

## 2018-11-15 MED FILL — ?ATORVASTATIN 40MG TABLET: 40 | 30 days supply | Qty: 30 | Fill #1

## 2018-11-15 MED FILL — ?CETIRIZINE HCL 10 MG TABLE: 10 | 30 days supply | Qty: 30 | Fill #1

## 2018-11-22 MED FILL — $LANTUS 100 UNITS/ML VIAL: 100 | 85 days supply | Qty: 30 | Fill #1

## 2018-11-26 MED FILL — ETHYL CHLORIDE SPRAY: 30 days supply | Qty: 104 | Fill #0

## 2018-12-05 ENCOUNTER — Other Ambulatory Visit: Payer: Self-pay | Admitting: Family Medicine

## 2018-12-05 DIAGNOSIS — E1149 Type 2 diabetes mellitus with other diabetic neurological complication: Secondary | ICD-10-CM

## 2018-12-05 MED FILL — ?DULoxetine HCL 60 MG CPEP: 60 | 30 days supply | Qty: 30 | Fill #0

## 2018-12-16 MED FILL — GABAPENTIN 300 MG CAPSULE: 300 | 30 days supply | Qty: 60 | Fill #2

## 2018-12-16 MED FILL — ?ATORVASTATIN 40MG TABLET: 40 | 30 days supply | Qty: 30 | Fill #2

## 2018-12-16 MED FILL — ?CETIRIZINE HCL 10 MG TABLE: 10 | 30 days supply | Qty: 30 | Fill #2

## 2018-12-31 MED FILL — ?DULoxetine HCL 60 MG CPEP: 60 | 30 days supply | Qty: 30 | Fill #0

## 2018-12-31 MED FILL — ETHYL CHLORIDE AERO: 30 days supply | Qty: 104 | Fill #1

## 2019-01-01 MED FILL — TRUEPLUS SYR 1ML 31GX5/16: 31G X 5/16" | 25 days supply | Qty: 100 | Fill #3

## 2019-01-01 MED FILL — TRUEPLUS SYR 1ML 31GX5/16": 31G X 5/16" | 25 days supply | Qty: 100 | Fill #3

## 2019-01-20 ENCOUNTER — Other Ambulatory Visit: Payer: Self-pay | Admitting: Family Medicine

## 2019-01-20 DIAGNOSIS — E7800 Pure hypercholesterolemia, unspecified: Secondary | ICD-10-CM

## 2019-01-20 MED FILL — GABAPENTIN 300 MG CAPSULE: 300 | 30 days supply | Qty: 60 | Fill #3

## 2019-01-20 MED FILL — LISINOPRIL 20 MG TABLET: 20 | 90 days supply | Qty: 90 | Fill #1

## 2019-01-22 ENCOUNTER — Other Ambulatory Visit (HOSPITAL_COMMUNITY): Payer: Self-pay | Admitting: Nephrology

## 2019-01-22 DIAGNOSIS — N186 End stage renal disease: Secondary | ICD-10-CM

## 2019-01-23 ENCOUNTER — Telehealth: Payer: Self-pay | Admitting: Family Medicine

## 2019-01-23 DIAGNOSIS — E7800 Pure hypercholesterolemia, unspecified: Secondary | ICD-10-CM

## 2019-01-23 NOTE — Telephone Encounter (Signed)
1) Medication(s) Requested (by name): atorvastatin (LIPITOR) 40 MG tablet [[id number]]  cetirizine (ZYRTEC) 10 MG tablet [[id number]]   2) Pharmacy of Choice: [place], [address]    3) Special Requests: Has appt on 8/25  Approved medications will be sent to the pharmacy, we will reach out if there is an issue.  Requests made after 3pm may not be addressed until the following business day!  If a patient is unsure of the name of the medication(s) please note and ask patient to call back when they are able to provide all info, do not send to responsible party until all information is available!

## 2019-01-24 ENCOUNTER — Other Ambulatory Visit: Payer: Self-pay | Admitting: Student

## 2019-01-24 MED ORDER — CETIRIZINE HCL 10 MG PO TABS: 10.0000 mg | ORAL_TABLET | Freq: Every day | ORAL | 0 refills | Status: DC

## 2019-01-24 MED ORDER — ATORVASTATIN CALCIUM 40 MG PO TABS: 40.0000 mg | ORAL_TABLET | Freq: Every day | ORAL | 0 refills | Status: DC

## 2019-01-24 MED FILL — ATORVASTATIN CALCIUM 40 MG: 40 | 30 days supply | Qty: 30 | Fill #0

## 2019-01-27 ENCOUNTER — Other Ambulatory Visit: Payer: Self-pay

## 2019-01-27 ENCOUNTER — Ambulatory Visit (HOSPITAL_COMMUNITY)
Admission: RE | Admit: 2019-01-27 | Discharge: 2019-01-27 | Disposition: A | Discharge status: Home / Self Care | Payer: Medicaid Other | Source: Ambulatory Visit | Attending: Nephrology | Admitting: Nephrology

## 2019-01-27 ENCOUNTER — Other Ambulatory Visit (HOSPITAL_COMMUNITY): Payer: Self-pay | Admitting: Nephrology

## 2019-01-27 ENCOUNTER — Encounter (HOSPITAL_COMMUNITY): Payer: Self-pay | Admitting: Diagnostic Radiology

## 2019-01-27 DIAGNOSIS — Z79899 Other long term (current) drug therapy: Secondary | ICD-10-CM | POA: Insufficient documentation

## 2019-01-27 DIAGNOSIS — Z794 Long term (current) use of insulin: Secondary | ICD-10-CM | POA: Diagnosis not present

## 2019-01-27 DIAGNOSIS — I120 Hypertensive chronic kidney disease with stage 5 chronic kidney disease or end stage renal disease: Secondary | ICD-10-CM | POA: Diagnosis not present

## 2019-01-27 DIAGNOSIS — Z992 Dependence on renal dialysis: Secondary | ICD-10-CM | POA: Insufficient documentation

## 2019-01-27 DIAGNOSIS — E1122 Type 2 diabetes mellitus with diabetic chronic kidney disease: Secondary | ICD-10-CM | POA: Insufficient documentation

## 2019-01-27 DIAGNOSIS — E1140 Type 2 diabetes mellitus with diabetic neuropathy, unspecified: Secondary | ICD-10-CM | POA: Insufficient documentation

## 2019-01-27 DIAGNOSIS — Y841 Kidney dialysis as the cause of abnormal reaction of the patient, or of later complication, without mention of misadventure at the time of the procedure: Secondary | ICD-10-CM | POA: Insufficient documentation

## 2019-01-27 DIAGNOSIS — N186 End stage renal disease: Secondary | ICD-10-CM | POA: Insufficient documentation

## 2019-01-27 DIAGNOSIS — E785 Hyperlipidemia, unspecified: Secondary | ICD-10-CM | POA: Insufficient documentation

## 2019-01-27 DIAGNOSIS — T82868A Thrombosis of vascular prosthetic devices, implants and grafts, initial encounter: Secondary | ICD-10-CM | POA: Insufficient documentation

## 2019-01-27 HISTORY — PX: IR US GUIDE VASC ACCESS LEFT: IMG2389

## 2019-01-27 HISTORY — PX: IR THROMBECTOMY AV FISTULA W/THROMBOLYSIS/PTA INC/SHUNT/IMG LEFT: IMG6106

## 2019-01-27 LAB — POCT I-STAT, CHEM 8
BUN: 136 mg/dL — ABNORMAL HIGH (ref 8–23)
Calcium, Ion: 1.16 mmol/L (ref 1.15–1.40)
Chloride: 101 mmol/L (ref 98–111)
Creatinine, Ser: 15 mg/dL — ABNORMAL HIGH (ref 0.44–1.00)
Glucose, Bld: 95 mg/dL (ref 70–99)
HCT: 33 % — ABNORMAL LOW (ref 36.0–46.0)
Hemoglobin: 11.2 g/dL — ABNORMAL LOW (ref 12.0–15.0)
Potassium: 5.8 mmol/L — ABNORMAL HIGH (ref 3.5–5.1)
Sodium: 134 mmol/L — ABNORMAL LOW (ref 135–145)
TCO2: 26 mmol/L (ref 22–32)

## 2019-01-27 LAB — GLUCOSE, CAPILLARY: Glucose-Capillary: 88 mg/dL (ref 70–99)

## 2019-01-27 LAB — CBC
HCT: 32.8 % — ABNORMAL LOW (ref 36.0–46.0)
Hemoglobin: 10.6 g/dL — ABNORMAL LOW (ref 12.0–15.0)
MCH: 32 pg (ref 26.0–34.0)
MCHC: 32.3 g/dL (ref 30.0–36.0)
MCV: 99.1 fL (ref 80.0–100.0)
Platelets: 200 10*3/uL (ref 150–400)
RBC: 3.31 MIL/uL — ABNORMAL LOW (ref 3.87–5.11)
RDW: 14 % (ref 11.5–15.5)
WBC: 6.2 10*3/uL (ref 4.0–10.5)
nRBC: 0 % (ref 0.0–0.2)

## 2019-01-27 LAB — PROTIME-INR
INR: 1.2 (ref 0.8–1.2)
Prothrombin Time: 15.1 seconds (ref 11.4–15.2)

## 2019-01-27 MED ORDER — HYDROCODONE-ACETAMINOPHEN 5-325 MG PO TABS: 1.0000 | ORAL_TABLET | ORAL | Status: DC | PRN

## 2019-01-27 MED ORDER — ALTEPLASE 2 MG IJ SOLR
INTRAMUSCULAR | Status: AC
  Filled 2019-01-27: qty 2

## 2019-01-27 MED ORDER — HEPARIN SODIUM (PORCINE) 1000 UNIT/ML IJ SOLN
INTRAMUSCULAR | Status: AC | PRN
  Administered 2019-01-27: 3000 [IU] via INTRAVENOUS

## 2019-01-27 MED ORDER — ALTEPLASE 100 MG IV SOLR
INTRAVENOUS | Status: AC | PRN
  Administered 2019-01-27: 2 mg

## 2019-01-27 MED ORDER — SODIUM CHLORIDE 0.9 % IV SOLN: INTRAVENOUS | Status: DC

## 2019-01-27 MED ORDER — LIDOCAINE HCL (PF) 1 % IJ SOLN
INTRAMUSCULAR | Status: AC | PRN
  Administered 2019-01-27: 5 mL

## 2019-01-27 MED ORDER — FENTANYL CITRATE (PF) 100 MCG/2ML IJ SOLN
INTRAMUSCULAR | Status: AC
  Filled 2019-01-27: qty 2

## 2019-01-27 MED ORDER — LIDOCAINE HCL 1 % IJ SOLN
INTRAMUSCULAR | Status: AC
  Filled 2019-01-27: qty 20

## 2019-01-27 MED ORDER — FENTANYL CITRATE (PF) 100 MCG/2ML IJ SOLN
INTRAMUSCULAR | Status: AC | PRN
  Administered 2019-01-27 (×2): 25 ug via INTRAVENOUS

## 2019-01-27 MED ORDER — SODIUM CHLORIDE 0.9 % IV SOLN
INTRAVENOUS | Status: AC | PRN
  Administered 2019-01-27: 10 mL/h via INTRAVENOUS

## 2019-01-27 MED ORDER — HEPARIN SODIUM (PORCINE) 1000 UNIT/ML IJ SOLN
INTRAMUSCULAR | Status: AC
  Filled 2019-01-27: qty 1

## 2019-01-27 MED ORDER — MIDAZOLAM HCL 2 MG/2ML IJ SOLN
INTRAMUSCULAR | Status: AC
  Filled 2019-01-27: qty 2

## 2019-01-27 MED ORDER — MIDAZOLAM HCL 2 MG/2ML IJ SOLN
INTRAMUSCULAR | Status: AC | PRN
  Administered 2019-01-27: 1 mg via INTRAVENOUS

## 2019-01-27 MED ORDER — IOHEXOL 300 MG/ML  SOLN: 120.0000 mL | Freq: Once | INTRAMUSCULAR | Status: DC | PRN

## 2019-01-27 NOTE — H&P (Signed)
Chief Complaint: Patient was seen in consultation today for renal failure  Referring Physician(s): Coladonato,Joseph  Supervising Physician: Markus Daft  Patient Status: [facility] - Out-pt  History of Present Illness: Sarah Kelley is a 68 y.o. female with past medical history of DM, HTN, ESRD on dialysis via LUA fistula which has been previously declotted/angioplastied several times over the past year in IR.  Most recently, her fistula was angioplastied 09/11/18 by Dr. Pascal Lux.  She presented to dialysis Thursday of last week and was unable to dialyze due to decreased flow.  She presents to IR today for declotting procedure.   Patient has been NPO.  She does not take blood thinners.   She is accompanied by family and interpreter today.  Denies new concerns.  In her usual state of health. Unsure if she is for dialysis today after procedure vs. Regularly scheduled session tomorrow.   Past Medical History:  Diagnosis Date  . Diabetes mellitus   . ESRD (end stage renal disease) ([place])   . Hyperlipidemia   . Hypertension   . Neuropathy    Diabetic  . Pneumonia 07/2016  . Renal disorder   . Sepsis ([place])   . Thyroid disease     Past Surgical History:  Procedure Laterality Date  . AMPUTATION Left 05/21/2015   Procedure: Left Foot 3rd Ray Amputation;  Surgeon: Newt Minion, MD;  Location: [place];  Service: Orthopedics;  Laterality: Left;  . AV FISTULA PLACEMENT Left 03/28/2016   Procedure: ARTERIOVENOUS (AV) FISTULA CREATION LEFT ARM;  Surgeon: Waynetta Sandy, MD;  Location: [place];  Service: Vascular;  Laterality: Left;  . DILATION AND CURETTAGE OF UTERUS    . FISTULA SUPERFICIALIZATION Left 09/04/2016   Procedure: FISTULA SUPERFICIALIZATION LEFT UPPER ARM;  Surgeon: Waynetta Sandy, MD;  Location: [place];  Service: Vascular;  Laterality: Left;  . IR AV DIALY SHUNT INTRO NEEDLE/INTRAC INITIAL W/PTA/STENT/IMG LT Left 06/13/2017  . IR AV DIALY SHUNT INTRO  NEEDLE/INTRAC INITIAL W/PTA/STENT/IMG LT Left 09/12/2017  . IR AV DIALY SHUNT INTRO NEEDLE/INTRACATH INITIAL W/PTA/IMG LEFT  11/10/2016  . IR AV DIALY SHUNT INTRO NEEDLE/INTRACATH INITIAL W/PTA/IMG LEFT  03/09/2017  . IR AV DIALY SHUNT INTRO NEEDLE/INTRACATH INITIAL W/PTA/IMG LEFT  12/12/2017  . IR AV DIALY SHUNT INTRO NEEDLE/INTRACATH INITIAL W/PTA/IMG LEFT  02/27/2018  . IR AV DIALY SHUNT INTRO NEEDLE/INTRACATH INITIAL W/PTA/IMG LEFT  06/05/2018  . IR AV DIALY SHUNT INTRO NEEDLE/INTRACATH INITIAL W/PTA/IMG LEFT  09/11/2018  . IR DIALY SHUNT INTRO NEEDLE/INTRACATH INITIAL W/IMG LEFT Left 05/08/2018  . IR GENERIC HISTORICAL  08/01/2016   IR FLUORO GUIDE CV LINE RIGHT 08/01/2016 [name], MD [facility]-INTERV RAD  . IR GENERIC HISTORICAL  08/01/2016   IR US GUIDE VASC ACCESS RIGHT 08/01/2016 [name], MD [facility]-INTERV RAD  . IR REMOVAL TUN CV CATH W/O FL  11/01/2016  . IR US GUIDE VASC ACCESS LEFT  11/10/2016  . IR US GUIDE VASC ACCESS LEFT  09/12/2017  . IR US GUIDE VASC ACCESS LEFT  12/12/2017  . IR US GUIDE VASC ACCESS LEFT  06/05/2018  . IR US GUIDE VASC ACCESS LEFT  09/11/2018    Allergies: No known allergies  Medications: Prior to Admission medications   Medication Sig Start Date End Date Taking? Authorizing Provider  atorvastatin (LIPITOR) 40 MG tablet Take 1 tablet (40 mg total) by mouth daily. 01/24/19  Yes [name], MD  cetirizine (ZYRTEC) 10 MG tablet Take 1 tablet (10 mg total) by mouth daily. 01/24/19  Yes  [name], MD  DULoxetine (CYMBALTA) 60 MG capsule TAKE 1 CAPSULE (60 MG TOTAL) BY MOUTH DAILY. 12/05/18  Yes [name], MD  insulin glargine (LANTUS) 100 UNIT/ML injection Inject subcutaneously twice daily 20 units in the morning and 15 units in the evening 07/25/18  Yes McClung, Angela M, PA-C  lisinopril (PRINIVIL,ZESTRIL) 20 MG tablet Take 1 tablet (20 mg total) by mouth daily. 10/16/18  Yes [name], MD  Amino Acids-Protein Hydrolys (FEEDING SUPPLEMENT, PRO-STAT SUGAR FREE  64,) LIQD Take 30 mLs by mouth 2 (two) times daily. 02/17/18   Elgergawy, Silver Huguenin, MD  bacitracin 500 UNIT/GM ointment Apply 1 application topically 2 (two) times daily. 03/27/18   [name], MD  Blood Glucose Monitoring Suppl (TRUE METRIX METER) DEVI 1 each by Does not apply route 3 (three) times daily before meals. 07/25/17   [name], MD  cephALEXin (KEFLEX) 500 MG capsule TAKE 1 CAPSULE (500 MG TOTAL) BY MOUTH DAILY. GIVE AFTER HEMODIALYSIS ON DIALYSIS DAYS. 10/15/18   Ladell Pier, MD  gabapentin (NEURONTIN) 300 MG capsule Take 1 capsule (300 mg total) by mouth at bedtime. Patient not taking: Reported on 09/09/2018 02/17/18   Elgergawy, Silver Huguenin, MD  gabapentin (NEURONTIN) 300 MG capsule TAKE 2 CAPSULES (600 MG TOTAL) BY MOUTH AT BEDTIME. 10/15/18   Ladell Pier, MD  glucose blood (TRUE METRIX BLOOD GLUCOSE TEST) test strip Use 3 times daily before meals 07/25/17   [name], MD  TRUEPLUS INSULIN SYRINGE 31G X 5/16" 1 ML MISC USE AS DIRECTED 07/22/18   [name], MD  TRUEPLUS LANCETS 28G MISC 1 each by Does not apply route 3 (three) times daily before meals. 07/25/17   [name], MD     No family history on file.  Social History   Socioeconomic History  . Marital status: Widowed    Spouse name: Not on file  . Number of children: Not on file  . Years of education: Not on file  . Highest education level: Not on file  Occupational History  . Not on file  Social Needs  . Financial resource strain: Not on file  . Food insecurity    Worry: Not on file    Inability: Not on file  . Transportation needs    Medical: Not on file    Non-medical: Not on file  Tobacco Use  . Smoking status: Never Smoker  . Smokeless tobacco: Never Used  Substance and Sexual Activity  . Alcohol use: No    Alcohol/week: 0.0 standard drinks    Comment: rare- a beer every now and then  . Drug use: No  . Sexual activity: Not Currently  Lifestyle  . Physical activity     Days per week: Not on file    Minutes per session: Not on file  . Stress: Not on file  Relationships  . Social Herbalist on phone: Not on file    Gets together: Not on file    Attends religious service: Not on file    Active member of club or organization: Not on file    Attends meetings of clubs or organizations: Not on file    Relationship status: Not on file  Other Topics Concern  . Not on file  Social History Narrative  . Not on file     Review of Systems: A 12 point ROS discussed and pertinent positives are indicated in the HPI above.  All other systems are negative.  Review of Systems  Constitutional: Negative for fatigue and fever.  Respiratory: Negative for cough and shortness of breath.   Cardiovascular: Negative for chest pain.  Gastrointestinal: Negative for abdominal pain.  Musculoskeletal: Negative for back pain.  Psychiatric/Behavioral: Negative for behavioral problems and confusion.    Vital Signs: BP (!) 150/52   Pulse (!) 52   Temp (!) 97.3 F (36.3 C)   Ht 5' (1.524 m)   Wt 154 lb 5.2 oz (70 kg) Comment: stated by son this was her wt on sat at dialysis  LMP  (LMP Unknown)   SpO2 98%   BMI 30.14 kg/m   Physical Exam Vitals signs and nursing note reviewed.  Constitutional:      Appearance: Normal appearance.  HENT:     Mouth/Throat:     Mouth: Mucous membranes are moist.     Pharynx: Oropharynx is clear.  Cardiovascular:     Rate and Rhythm: Normal rate and regular rhythm.  Pulmonary:     Effort: Pulmonary effort is normal.     Breath sounds: Normal breath sounds.  Musculoskeletal:     Comments: LUA fistula present. Distal pulse, No thrill.   Skin:    General: Skin is warm and dry.  Neurological:     General: No focal deficit present.     Mental Status: She is alert and oriented to person, place, and time. Mental status is at baseline.  Psychiatric:        Mood and Affect: Mood normal.        Behavior: Behavior normal.         Thought Content: Thought content normal.        Judgment: Judgment normal.      MD Evaluation Airway: WNL Heart: WNL Abdomen: WNL Chest/ Lungs: WNL ASA  Classification: 3 Mallampati/Airway Score: Three   Imaging: No results found.  Labs:  CBC: Recent Labs    02/16/18 0442 05/08/18 0933 06/05/18 0830 09/11/18 0800  WBC 6.4 6.4 4.9 6.6  HGB 10.0* 10.7* 11.5* 11.9*  HCT 31.7* 33.6* 38.8 36.9  PLT 178 160 181 PLATELET CLUMPS NOTED ON SMEAR, UNABLE TO ESTIMATE    COAGS: Recent Labs    05/08/18 0933 06/05/18 0830  INR 1.15 1.14  APTT 34  --     BMP: Recent Labs    02/15/18 0527 02/16/18 0442 06/05/18 0830 09/11/18 0800  NA 139 135 140 139  K 4.0 4.2 4.5 5.4*  CL 99 95* 97* 98  CO2 27 26 28 30   GLUCOSE 109* 147* 154* 76  BUN 24* 46* 38* 31*  CALCIUM 8.8* 8.7* 9.2 9.4  CREATININE 4.55* 6.84* 5.95* 6.34*  GFRNONAA 9* 6* 7* 6*  GFRAA 10* 6* 8* 7*    LIVER FUNCTION TESTS: Recent Labs    02/10/18 2106 02/11/18 0940 02/12/18 0418  BILITOT 0.8 0.7  --   AST 24 24  --   ALT 18 17  --   ALKPHOS 137* 106  --   PROT 6.9 6.5  --   ALBUMIN 3.3* 2.8* 2.5*    TUMOR MARKERS: No results for input(s): AFPTM, CEA, CA199, CHROMGRNA in the last 8760 hours.  Assessment and Plan: Patient with past medical history of ESRD on HD via LUA fistula presents with complaint of clotted access, unability to dialyze.  IR consulted for declotting procedure. Case reviewed by Dr. Anselm Pancoast who approves patient for procedure.  Patient presents today in their usual state of health.  She has been NPO and is not currently on  blood thinners.  Her family is available today to assist with consent.  Interpreter present during visit.  Labs pending.    Risks and benefits discussed including, but not limited to bleeding, infection, vascular injury, pulmonary embolism, need for tunneled HD catheter placement or even death.  All of the patient's questions were answered, patient and family  are agreeable to proceed. Consent signed and in chart.  Thank you for this interesting consult.  I greatly enjoyed meeting [facility] and look forward to participating in their care.  A copy of this report was sent to the requesting provider on this date.  Electronically Signed: Docia Barrier, PA 01/27/2019, 11:49 AM   I spent a total of    25 Minutes in face to face in clinical consultation, greater than 50% of which was counseling/coordinating care for renal failure.

## 2019-01-27 NOTE — Discharge Instructions (Signed)
Colocacin de fstula AV, cuidados posteriores AV Fistula Placement, Care After Esta hoja le brinda informacin sobre cmo cuidarse despus del procedimiento. El mdico tambin podr darle indicaciones ms especficas. Comunquese con el mdico si tiene problemas o preguntas. Qu puedo esperar despus del procedimiento? Despus del procedimiento, es comn [employer] siguientes sntomas:  Dispensing optician.  Sentir una vibracin (temblor) sobre la fstula. Siga estas indicaciones en su casa: Cuidados de la incisin      Siga las indicaciones del mdico acerca del cuidado de la incisin. Asegrese de hacer lo siguiente: ? Lvese las manos con agua y [place] antes y despus de cambiar la venda (vendaje). Use desinfectante para manos si no dispone de [place] y [place]. ? Cambie el vendaje como se lo haya indicado el mdico. ? No retire los puntos (suturas), la goma para cerrar la piel o las tiras [place]. Es posible que estos cierres cutneos Animal nutritionist en la piel durante 2semanas o ms tiempo. Si los bordes de las tiras adhesivas empiezan a despegarse y Therapist, sports, puede recortar los que estn sueltos. No retire las tiras [facility] por completo a menos que el mdico se lo indique. Cuidado de Psychologist, counselling de la fstula todos los das para asegurarse de que el temblor se sienta igual.  Psychiatric nurse de la fstula todos los das para descartar signos de infeccin. Est atento a los siguientes signos: ? Enrojecimiento, hinchazn o dolor. ? Lquido o sangre. ? Calor. ? Pus o mal olor.  Cuando est sentado o acostado, levante (eleve) la zona afectada por encima del nivel del corazn.  No levante ningn objeto que pese ms de 10libras (4,5kg) o el lmite de peso que le hayan indicado, hasta que el mdico le diga que puede [place].  No se recueste sobre el brazo de la fstula.  No permita que le saquen sangre o le tomen la presin arterial en el brazo de la fstula.  Esto es importante.  No use joyas ni ropa apretadas en el brazo que tiene la fstula. Baarse  No tome baos de inmersin, no nade ni use el jacuzzi hasta que el mdico lo autorice. Pregntele al mdico si puede ducharse. Thurston Pounds solo le permitan darse baos de [place].  Mantenga limpia y seca la zona que rodea la incisin. Medicamentos  [employer] de venta libre y los recetados solamente como se lo haya indicado el mdico.  Pregntele al mdico si algn medicamento recetado puede causarle estreimiento. Es posible que deba tomar medidas para prevenir o Recruitment consultant, por ejemplo: ? Beber suficiente lquido como para mantener la orina de color amarillo plido. ? Tomar medicamentos recetados o de [employer]. ? Consumir alimentos ricos en fibra, como frijoles, cereales integrales, y frutas y verduras frescas. ? Limitar el consumo de alimentos ricos en grasa y azcares procesados, como los alimentos fritos o dulces. Indicaciones generales  Descanse por [employer].  Retome sus actividades normales como se lo haya indicado el mdico. Pregntele al mdico qu actividades son seguras para usted.  Concurra a todas las visitas de seguimiento como se lo haya indicado el mdico. Esto es importante. Comunquese con un mdico si:  Tiene enrojecimiento, hinchazn o dolor alrededor del lugar de la fstula.  El lugar de la fstula se siente caliente al tacto.  Tiene pus o percibe mal olor que proviene del lugar de la fstula.  Tiene fiebre.  Tiene adormecimiento o sensacin de fro Mudlogger  de la fstula.  Siente una disminucin o un cambio en el temblor. Solicite ayuda inmediatamente si:  Tiene sangrado que Therapist, occupational de la fstula.  Siente dolor en el pecho.  Tiene dificultad para respirar. [place] indicaciones del mdico acerca del cuidado de la incisin.  No permita que le saquen sangre o le tomen la presin arterial en el brazo  de la fstula. Esto es importante.  Retome sus actividades normales como se lo haya indicado el mdico. Pregntele al mdico qu actividades son seguras para usted.  Comunquese con un mdico si tiene algn cambio en el temblor o tiene signos de infeccin en el lugar de la fstula.  Concurra a todas las visitas de seguimiento como se lo haya indicado el mdico. Esto es importante. Esta informacin no tiene [name] el consejo del mdico. Asegrese de hacerle al mdico cualquier pregunta que tenga. Document Released: 06/19/2005 Document Revised: 01/30/2018 Document Reviewed: 01/30/2018 Elsevier Patient Education  2020 [name].

## 2019-01-27 NOTE — Procedures (Signed)
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  Successful declot procedure.    Complications: None     EBL: less than 50 ml  Plan: Remove sutures tomorrow at dialysis and try to use fistula tomorrow.     Sarah Kelley R. Anselm Pancoast, MD  Pager: [phone]

## 2019-01-30 ENCOUNTER — Other Ambulatory Visit: Payer: Self-pay | Admitting: Physician Assistant

## 2019-01-30 ENCOUNTER — Other Ambulatory Visit: Payer: Self-pay | Admitting: Radiology

## 2019-01-30 ENCOUNTER — Other Ambulatory Visit (HOSPITAL_COMMUNITY): Payer: Self-pay | Admitting: Nephrology

## 2019-01-30 DIAGNOSIS — Z992 Dependence on renal dialysis: Secondary | ICD-10-CM

## 2019-01-30 DIAGNOSIS — N186 End stage renal disease: Secondary | ICD-10-CM

## 2019-01-31 ENCOUNTER — Other Ambulatory Visit: Payer: Self-pay

## 2019-01-31 ENCOUNTER — Other Ambulatory Visit (HOSPITAL_COMMUNITY): Payer: Self-pay | Admitting: Nephrology

## 2019-01-31 ENCOUNTER — Ambulatory Visit (HOSPITAL_COMMUNITY)
Admission: RE | Admit: 2019-01-31 | Discharge: 2019-01-31 | Disposition: A | Discharge status: Home / Self Care | Payer: Medicaid Other | Source: Ambulatory Visit | Attending: Nephrology | Admitting: Nephrology

## 2019-01-31 DIAGNOSIS — N186 End stage renal disease: Secondary | ICD-10-CM

## 2019-01-31 DIAGNOSIS — E1140 Type 2 diabetes mellitus with diabetic neuropathy, unspecified: Secondary | ICD-10-CM | POA: Insufficient documentation

## 2019-01-31 DIAGNOSIS — Z992 Dependence on renal dialysis: Secondary | ICD-10-CM | POA: Diagnosis not present

## 2019-01-31 DIAGNOSIS — E785 Hyperlipidemia, unspecified: Secondary | ICD-10-CM | POA: Diagnosis not present

## 2019-01-31 DIAGNOSIS — E079 Disorder of thyroid, unspecified: Secondary | ICD-10-CM | POA: Insufficient documentation

## 2019-01-31 DIAGNOSIS — Z794 Long term (current) use of insulin: Secondary | ICD-10-CM | POA: Insufficient documentation

## 2019-01-31 DIAGNOSIS — Z89422 Acquired absence of other left toe(s): Secondary | ICD-10-CM | POA: Diagnosis not present

## 2019-01-31 DIAGNOSIS — T82868A Thrombosis of vascular prosthetic devices, implants and grafts, initial encounter: Secondary | ICD-10-CM | POA: Diagnosis present

## 2019-01-31 DIAGNOSIS — E1122 Type 2 diabetes mellitus with diabetic chronic kidney disease: Secondary | ICD-10-CM | POA: Insufficient documentation

## 2019-01-31 DIAGNOSIS — Z79899 Other long term (current) drug therapy: Secondary | ICD-10-CM | POA: Diagnosis not present

## 2019-01-31 DIAGNOSIS — I120 Hypertensive chronic kidney disease with stage 5 chronic kidney disease or end stage renal disease: Secondary | ICD-10-CM | POA: Diagnosis not present

## 2019-01-31 DIAGNOSIS — Y832 Surgical operation with anastomosis, bypass or graft as the cause of abnormal reaction of the patient, or of later complication, without mention of misadventure at the time of the procedure: Secondary | ICD-10-CM | POA: Insufficient documentation

## 2019-01-31 HISTORY — PX: IR THROMBECTOMY AV FISTULA W/THROMBOLYSIS INC/SHUNT/IMG LEFT: IMG6105

## 2019-01-31 LAB — CBC WITH DIFFERENTIAL/PLATELET
Abs Immature Granulocytes: 0.02 10*3/uL (ref 0.00–0.07)
Basophils Absolute: 0 10*3/uL (ref 0.0–0.1)
Basophils Relative: 0 %
Eosinophils Absolute: 0.2 10*3/uL (ref 0.0–0.5)
Eosinophils Relative: 3 %
HCT: 31.8 % — ABNORMAL LOW (ref 36.0–46.0)
Hemoglobin: 10 g/dL — ABNORMAL LOW (ref 12.0–15.0)
Immature Granulocytes: 0 %
Lymphocytes Relative: 34 %
Lymphs Abs: 2 10*3/uL (ref 0.7–4.0)
MCH: 32.2 pg (ref 26.0–34.0)
MCHC: 31.4 g/dL (ref 30.0–36.0)
MCV: 102.3 fL — ABNORMAL HIGH (ref 80.0–100.0)
Monocytes Absolute: 0.5 10*3/uL (ref 0.1–1.0)
Monocytes Relative: 8 %
Neutro Abs: 3.2 10*3/uL (ref 1.7–7.7)
Neutrophils Relative %: 55 %
Platelets: 247 10*3/uL (ref 150–400)
RBC: 3.11 MIL/uL — ABNORMAL LOW (ref 3.87–5.11)
RDW: 15.9 % — ABNORMAL HIGH (ref 11.5–15.5)
WBC: 6 10*3/uL (ref 4.0–10.5)
nRBC: 0 % (ref 0.0–0.2)

## 2019-01-31 LAB — BASIC METABOLIC PANEL
Anion gap: 14 (ref 5–15)
BUN: 86 mg/dL — ABNORMAL HIGH (ref 8–23)
CO2: 25 mmol/L (ref 22–32)
Calcium: 9 mg/dL (ref 8.9–10.3)
Chloride: 97 mmol/L — ABNORMAL LOW (ref 98–111)
Creatinine, Ser: 14.2 mg/dL — ABNORMAL HIGH (ref 0.44–1.00)
GFR calc Af Amer: 3 mL/min — ABNORMAL LOW (ref >60)
GFR calc non Af Amer: 2 mL/min — ABNORMAL LOW (ref >60)
Glucose, Bld: 199 mg/dL — ABNORMAL HIGH (ref 70–99)
Potassium: 6.1 mmol/L — ABNORMAL HIGH (ref 3.5–5.1)
Sodium: 136 mmol/L (ref 135–145)

## 2019-01-31 LAB — GLUCOSE, CAPILLARY
Glucose-Capillary: 69 mg/dL — ABNORMAL LOW (ref 70–99)
Glucose-Capillary: 157 mg/dL — ABNORMAL HIGH (ref 70–99)

## 2019-01-31 MED ORDER — ALTEPLASE 2 MG IJ SOLR
INTRAMUSCULAR | Status: DC | PRN
  Administered 2019-01-31: 2 mg

## 2019-01-31 MED ORDER — FENTANYL CITRATE (PF) 100 MCG/2ML IJ SOLN
INTRAMUSCULAR | Status: DC | PRN
  Administered 2019-01-31 (×2): 25 ug via INTRAVENOUS

## 2019-01-31 MED ORDER — SODIUM POLYSTYRENE SULFONATE 15 GM/60ML PO SUSP
30.0000 g | ORAL | Status: DC
  Filled 2019-01-31 (×2): qty 120

## 2019-01-31 MED ORDER — HEPARIN SODIUM (PORCINE) 1000 UNIT/ML IJ SOLN
INTRAMUSCULAR | Status: DC | PRN
  Administered 2019-01-31: 3000 [IU] via INTRAVENOUS

## 2019-01-31 MED ORDER — SODIUM POLYSTYRENE SULFONATE 15 GM/60ML PO SUSP: 30.0000 g | Freq: Once | ORAL | Status: DC

## 2019-01-31 MED ORDER — IOHEXOL 300 MG/ML  SOLN
100.0000 mL | Freq: Once | INTRAMUSCULAR | Status: AC | PRN
  Administered 2019-01-31: 25 mL via INTRAVENOUS

## 2019-01-31 MED ORDER — HEPARIN SODIUM (PORCINE) 1000 UNIT/ML IJ SOLN
INTRAMUSCULAR | Status: AC
  Filled 2019-01-31: qty 1

## 2019-01-31 MED ORDER — ALTEPLASE 2 MG IJ SOLR
INTRAMUSCULAR | Status: AC
  Filled 2019-01-31: qty 2

## 2019-01-31 MED ORDER — DEXTROSE 50 % IV SOLN
12.5000 g | INTRAVENOUS | Status: AC
  Administered 2019-01-31: 12.5 g via INTRAVENOUS
  Filled 2019-01-31: qty 50

## 2019-01-31 MED ORDER — FENTANYL CITRATE (PF) 100 MCG/2ML IJ SOLN
INTRAMUSCULAR | Status: AC
  Filled 2019-01-31: qty 2

## 2019-01-31 MED ORDER — SODIUM CHLORIDE 0.9 % IV SOLN
INTRAVENOUS | Status: DC | PRN
  Administered 2019-01-31: 10 mL/h via INTRAVENOUS

## 2019-01-31 MED ORDER — SODIUM CHLORIDE 0.9 % IV SOLN: INTRAVENOUS | Status: DC

## 2019-01-31 MED ORDER — LIDOCAINE HCL 1 % IJ SOLN
INTRAMUSCULAR | Status: DC | PRN
  Administered 2019-01-31: 10 mL

## 2019-01-31 MED ORDER — LIDOCAINE HCL 1 % IJ SOLN
INTRAMUSCULAR | Status: AC
  Filled 2019-01-31: qty 20

## 2019-01-31 NOTE — H&P (Signed)
Chief Complaint: Patient was seen in consultation today for fistulagram.  Referring Physician(s): Upton,Elizabeth  Supervising Physician: Arne Cleveland  Patient Status: [facility] - Out-pt  History of Present Illness: Sarah Kelley is a 68 y.o. female with a past medical history significant for DM, HTN, HLD and ESRD on HD via LUE fistula who was recently seen by our service on 01/27/19 due to a clotted fistula. She underwent a left arm AVF declot with thrombectomy and angioplasty with Dr. Anselm Pancoast that day. Per procedure note plan was to remove sutures in HD the following day when accessing fistula, however her son states that the went to dialysis on Tuesday as planned however when she arrived the staff felt her fistula and told her that it wouldn't work, so she did not receive dialysis. We have been asked to perform a fistulagram with possible intervention/possible tunneled HD catheter placement.   Patient laying in bed requesting to be helped to the bathroom, he son states that she does not like to use her briefs but has trouble walking by herself. Patient is a poor historian due to history of memory loss and her son provides most of the history today. He states that patient has not had any complaints that he is aware of except that her left arm is sore where her fistula is. He states that he is aware she may require a tunneled HD catheter so that she may undergo dialysis and he consents to this if needed.   Past Medical History:  Diagnosis Date   Diabetes mellitus    ESRD (end stage renal disease) ([place])    Hyperlipidemia    Hypertension    Neuropathy    Diabetic   Pneumonia 07/2016   Renal disorder    Sepsis ([place])    Thyroid disease     Past Surgical History:  Procedure Laterality Date   AMPUTATION Left 05/21/2015   Procedure: Left Foot 3rd Ray Amputation;  Surgeon: Newt Minion, MD;  Location: [place];  Service: Orthopedics;  Laterality: Left;   AV FISTULA PLACEMENT  Left 03/28/2016   Procedure: ARTERIOVENOUS (AV) FISTULA CREATION LEFT ARM;  Surgeon: Waynetta Sandy, MD;  Location: [place];  Service: Vascular;  Laterality: Left;   DILATION AND CURETTAGE OF UTERUS     FISTULA SUPERFICIALIZATION Left 09/04/2016   Procedure: FISTULA SUPERFICIALIZATION LEFT UPPER ARM;  Surgeon: Waynetta Sandy, MD;  Location: [place];  Service: Vascular;  Laterality: Left;   IR AV DIALY SHUNT INTRO NEEDLE/INTRAC INITIAL W/PTA/STENT/IMG LT Left 06/13/2017   IR AV DIALY SHUNT INTRO NEEDLE/INTRAC INITIAL W/PTA/STENT/IMG LT Left 09/12/2017   IR AV DIALY SHUNT INTRO NEEDLE/INTRACATH INITIAL W/PTA/IMG LEFT  11/10/2016   IR AV DIALY SHUNT INTRO NEEDLE/INTRACATH INITIAL W/PTA/IMG LEFT  03/09/2017   IR AV DIALY SHUNT INTRO NEEDLE/INTRACATH INITIAL W/PTA/IMG LEFT  12/12/2017   IR AV DIALY SHUNT INTRO NEEDLE/INTRACATH INITIAL W/PTA/IMG LEFT  02/27/2018   IR AV DIALY SHUNT INTRO NEEDLE/INTRACATH INITIAL W/PTA/IMG LEFT  06/05/2018   IR AV DIALY SHUNT INTRO NEEDLE/INTRACATH INITIAL W/PTA/IMG LEFT  09/11/2018   IR DIALY SHUNT INTRO NEEDLE/INTRACATH INITIAL W/IMG LEFT Left 05/08/2018   IR GENERIC HISTORICAL  08/01/2016   IR FLUORO GUIDE CV LINE RIGHT 08/01/2016 [name], MD [facility]-INTER[facility] RAD   IR GENERIC HISTORICAL  08/01/2016   IR US GUIDE VASC ACCESS RIGHT 08/01/2016 [name], MD [facility]   IR REMOVAL TUN CV CATH W/O FL  11/01/2016   IR THROMBECTOMY AV FISTULA W/THROMBOLYSIS/PTA INC/SHUNT/IMG LEFT Left 01/27/2019  IR US GUIDE VASC ACCESS LEFT  11/10/2016   IR US GUIDE VASC ACCESS LEFT  09/12/2017   IR US GUIDE VASC ACCESS LEFT  12/12/2017   IR US GUIDE VASC ACCESS LEFT  06/05/2018   IR US GUIDE VASC ACCESS LEFT  09/11/2018   IR US GUIDE VASC ACCESS LEFT  01/27/2019    Allergies: No known allergies  Medications: Prior to Admission medications   Medication Sig Start Date End Date Taking? Authorizing Provider  Amino Acids-Protein Hydrolys (FEEDING SUPPLEMENT,  PRO-STAT SUGAR FREE 64,) LIQD Take 30 mLs by mouth 2 (two) times daily. 02/17/18  Yes Elgergawy, Silver Huguenin, MD  atorvastatin (LIPITOR) 40 MG tablet Take 1 tablet (40 mg total) by mouth daily. 01/24/19  Yes [name], MD  bacitracin 500 UNIT/GM ointment Apply 1 application topically 2 (two) times daily. 03/27/18  Yes Newlin, [name], MD  cephALEXin (KEFLEX) 500 MG capsule TAKE 1 CAPSULE (500 MG TOTAL) BY MOUTH DAILY. GIVE AFTER HEMODIALYSIS ON DIALYSIS DAYS. 10/15/18  Yes Ladell Pier, MD  cetirizine (ZYRTEC) 10 MG tablet Take 1 tablet (10 mg total) by mouth daily. 01/24/19  Yes Newlin, Charlane Ferretti, MD  DULoxetine (CYMBALTA) 60 MG capsule TAKE 1 CAPSULE (60 MG TOTAL) BY MOUTH DAILY. 12/05/18  Yes [name], MD  gabapentin (NEURONTIN) 300 MG capsule TAKE 2 CAPSULES (600 MG TOTAL) BY MOUTH AT BEDTIME. 10/15/18  Yes Ladell Pier, MD  insulin glargine (LANTUS) 100 UNIT/ML injection Inject subcutaneously twice daily 20 units in the morning and 15 units in the evening 07/25/18  Yes McClung, Angela M, PA-C  lisinopril (PRINIVIL,ZESTRIL) 20 MG tablet Take 1 tablet (20 mg total) by mouth daily. 10/16/18  Yes [name], MD  TRUEPLUS INSULIN SYRINGE 31G X 5/16" 1 ML MISC USE AS DIRECTED 07/22/18  Yes Newlin, [name], MD  Blood Glucose Monitoring Suppl (TRUE METRIX METER) DEVI 1 each by Does not apply route 3 (three) times daily before meals. 07/25/17   [name], MD  gabapentin (NEURONTIN) 300 MG capsule Take 1 capsule (300 mg total) by mouth at bedtime. 02/17/18   Elgergawy, Silver Huguenin, MD  glucose blood (TRUE METRIX BLOOD GLUCOSE TEST) test strip Use 3 times daily before meals 07/25/17   [name], MD  TRUEPLUS LANCETS 28G MISC 1 each by Does not apply route 3 (three) times daily before meals. 07/25/17   [name], MD     No family history on file.  Social History   Socioeconomic History   Marital status: Widowed    Spouse name: Not on file   Number of children: Not on file     Years of education: Not on file   Highest education level: Not on file  Occupational History   Not on file  Social Needs   Financial resource strain: Not on file   Food insecurity    Worry: Not on file    Inability: Not on file   Transportation needs    Medical: Not on file    Non-medical: Not on file  Tobacco Use   Smoking status: Never Smoker   Smokeless tobacco: Never Used  Substance and Sexual Activity   Alcohol use: No    Alcohol/week: 0.0 standard drinks    Comment: rare- a beer every now and then   Drug use: No   Sexual activity: Not Currently  Lifestyle   Physical activity    Days per week: Not on file    Minutes per session: Not on file   Stress:  Not on file  Relationships   Social connections    Talks on phone: Not on file    Gets together: Not on file    Attends religious service: Not on file    Active member of club or organization: Not on file    Attends meetings of clubs or organizations: Not on file    Relationship status: Not on file  Other Topics Concern   Not on file  Social History Narrative   Not on file     Review of Systems: A 12 point ROS discussed and pertinent positives are indicated in the HPI above.  All other systems are negative.  Review of Systems  Constitutional: Negative for chills and fever.       Limited ROS - patient poor historian, does not answer some questions.  Respiratory: Negative for cough and shortness of breath.   Cardiovascular: Negative for chest pain.  Gastrointestinal: Negative for abdominal pain, diarrhea, nausea and vomiting.  Neurological: Negative for dizziness.    Vital Signs: BP (!) 120/44    Pulse 63    Temp 97.7 F (36.5 C) (Temporal)    Ht 5' (1.524 m)    Wt 154 lb 5.2 oz (70 kg)    LMP  (LMP Unknown)    SpO2 99%    BMI 30.14 kg/m   Physical Exam Vitals signs reviewed.  Constitutional:      General: She is not in acute distress. HENT:     Head: Normocephalic.  Cardiovascular:      Rate and Rhythm: Normal rate and regular rhythm.     Comments: LUE AVF with sutures in place, diffuse ecchymoses surrounding fistula. No open wounds, erythema, edema, bleeding or pain to palpation. No palpable thrill or audible bruit.  Pulmonary:     Effort: Pulmonary effort is normal.     Breath sounds: Normal breath sounds.  Abdominal:     General: There is no distension.     Palpations: Abdomen is soft.     Tenderness: There is no abdominal tenderness.  Skin:    General: Skin is warm.     Findings: Bruising (Diffuse left upper extremity bruising surrounding AVF) present.  Neurological:     Mental Status: She is alert. Mental status is at baseline.  Psychiatric:        Mood and Affect: Mood normal.        Behavior: Behavior normal.      MD Evaluation Airway: WNL Heart: WNL Abdomen: WNL Chest/ Lungs: WNL ASA  Classification: 3 Mallampati/Airway Score: Two   Imaging: Ir US Guide Vasc Access Left  Result Date: 01/27/2019 INDICATION: 68 year old with end-stage renal disease and left upper extremity brachiocephalic fistula. Concern for recurrent stenosis or thrombosed AV fistula. EXAM: DIALYSIS FISTULA DECLOT WITH THROMBOLYSIS AND THROMBECTOMY VENOUS ANGIOPLASTY ULTRASOUND GUIDANCE FOR VASCULAR ACCESS Physician: Stephan Minister. Anselm Pancoast, MD MEDICATIONS: None. ANESTHESIA/SEDATION: Heparin 3000 units, TPA 2 mg, Versed 1.0 mg, Fentanyl 50 mcg Moderate Sedation Time:  102 minutes The patient was continuously monitored during the procedure by the interventional radiology nurse under my direct supervision. FLUOROSCOPY TIME:  Fluoroscopy Time: 14 minutes, 6 seconds, 43 mGy COMPLICATIONS: None immediate. PROCEDURE: The procedure was explained to the patient and son with a Optometrist. The risks and benefits of the procedure were discussed and the patient's questions were addressed. Informed consent was obtained from the patient and son. The left upper arm was prepped and draped in a sterile fashion.  Maximal barrier sterile technique was utilized including caps,  mask, sterile gowns, sterile gloves, sterile drape, hand hygiene and skin antiseptic. The left upper extremity AV fistula was evaluated with ultrasound. The anastomosis was patent but the left cephalic vein was thrombosed just beyond the arterial anastomosis. Aneurysmal component of the cephalic vein in the lower humeral region contained a large amount of thrombus. Skin was anesthetized with 1% lidocaine. Using ultrasound guidance, 21 gauge needle was directed into the thrombosed segment of the vein near the arterial anastomosis. Micropuncture dilator set was placed. 6 [place] vascular sheath was placed over a stiff Amplatz wire. Kumpe catheter was advanced centrally into the central veins and central venogram was performed. The catheter was pulled back and additional venogram images were obtained. 2 mg of tPA was infused along the length of the cephalic vein. Bentson wire was advanced centrally. The cephalic vein from the lower humeral region to the cephalic arch was treated with the Angiojet thrombectomy device. Cephalic vein was also angioplastied with a 6 mm x 40 mm Conquest balloon. There was a small amount of flow within the fistula but the flow was slow and there is a large amount of residual thrombus in the aneurysmal segments in the lower humeral region. The Bentson wire was removed. The thrombus within the aneurysmal segment was treated with the PTD thrombectomy device. Although a large amount of the thrombus was removed, there was residual thrombus near the venous sheath. Mid humeral area was anesthetized with 1% lidocaine and a 21 gauge needle was directed in cephalic vein towards the arterial anastomosis ultrasound guided. A second 6 French vascular sheath was placed. Five [place] Fogarty balloon was used to remove the thrombus near the lower humeral region. PTD thrombectomy device was used multiple times in order to remove thrombus within the  aneurysm. Cephalic vein was treated again with the 6 mm balloon and the distal humeral aneurysmal segment of the cephalic vein was treated with a 7 mm x 40 mm Conquest balloon. Eventually, there was excellent flow throughout the brachiocephalic fistula. Vascular sheaths were removed with pursestring sutures. Dressing was placed over the access sites. FINDINGS: The brachiocephalic AV fistula anastomosis was patent but there was a large amount of thrombus just beyond the anastomosis and a large amount of thrombus within the aneurysmal segment of the cephalic vein in the lower humeral region. Left subclavian vein and central veins are patent. Following thrombolysis, thrombectomy and balloon angioplasty, flow was restored within the left cephalic vein. The cephalic arch stent was patent at the end of the procedure. Residual nonocclusive thrombus in the aneurysm segment of the cephalic vein at the end of the procedure. IMPRESSION: Successful declot of the left brachiocephalic fistula using catheter directed thrombolysis and thrombectomy. Balloon angioplasty of the left cephalic vein. Electronically Signed   By: Markus Daft M.D.   On: 01/27/2019 17:36   Ir Thrombectomy Av Fistula W/thrombolysis/pta Inc/shunt/img Left  Result Date: 01/27/2019 INDICATION: 68 year old with end-stage renal disease and left upper extremity brachiocephalic fistula. Concern for recurrent stenosis or thrombosed AV fistula. EXAM: DIALYSIS FISTULA DECLOT WITH THROMBOLYSIS AND THROMBECTOMY VENOUS ANGIOPLASTY ULTRASOUND GUIDANCE FOR VASCULAR ACCESS Physician: Stephan Minister. Anselm Pancoast, MD MEDICATIONS: None. ANESTHESIA/SEDATION: Heparin 3000 units, TPA 2 mg, Versed 1.0 mg, Fentanyl 50 mcg Moderate Sedation Time:  102 minutes The patient was continuously monitored during the procedure by the interventional radiology nurse under my direct supervision. FLUOROSCOPY TIME:  Fluoroscopy Time: 14 minutes, 6 seconds, 43 mGy COMPLICATIONS: None immediate. PROCEDURE: The  procedure was explained to the patient and son with a  translator. The risks and benefits of the procedure were discussed and the patient's questions were addressed. Informed consent was obtained from the patient and son. The left upper arm was prepped and draped in a sterile fashion. Maximal barrier sterile technique was utilized including caps, mask, sterile gowns, sterile gloves, sterile drape, hand hygiene and skin antiseptic. The left upper extremity AV fistula was evaluated with ultrasound. The anastomosis was patent but the left cephalic vein was thrombosed just beyond the arterial anastomosis. Aneurysmal component of the cephalic vein in the lower humeral region contained a large amount of thrombus. Skin was anesthetized with 1% lidocaine. Using ultrasound guidance, 21 gauge needle was directed into the thrombosed segment of the vein near the arterial anastomosis. Micropuncture dilator set was placed. 6 [place] vascular sheath was placed over a stiff Amplatz wire. Kumpe catheter was advanced centrally into the central veins and central venogram was performed. The catheter was pulled back and additional venogram images were obtained. 2 mg of tPA was infused along the length of the cephalic vein. Bentson wire was advanced centrally. The cephalic vein from the lower humeral region to the cephalic arch was treated with the Angiojet thrombectomy device. Cephalic vein was also angioplastied with a 6 mm x 40 mm Conquest balloon. There was a small amount of flow within the fistula but the flow was slow and there is a large amount of residual thrombus in the aneurysmal segments in the lower humeral region. The Bentson wire was removed. The thrombus within the aneurysmal segment was treated with the PTD thrombectomy device. Although a large amount of the thrombus was removed, there was residual thrombus near the venous sheath. Mid humeral area was anesthetized with 1% lidocaine and a 21 gauge needle was directed in  cephalic vein towards the arterial anastomosis ultrasound guided. A second 6 French vascular sheath was placed. Five [place] Fogarty balloon was used to remove the thrombus near the lower humeral region. PTD thrombectomy device was used multiple times in order to remove thrombus within the aneurysm. Cephalic vein was treated again with the 6 mm balloon and the distal humeral aneurysmal segment of the cephalic vein was treated with a 7 mm x 40 mm Conquest balloon. Eventually, there was excellent flow throughout the brachiocephalic fistula. Vascular sheaths were removed with pursestring sutures. Dressing was placed over the access sites. FINDINGS: The brachiocephalic AV fistula anastomosis was patent but there was a large amount of thrombus just beyond the anastomosis and a large amount of thrombus within the aneurysmal segment of the cephalic vein in the lower humeral region. Left subclavian vein and central veins are patent. Following thrombolysis, thrombectomy and balloon angioplasty, flow was restored within the left cephalic vein. The cephalic arch stent was patent at the end of the procedure. Residual nonocclusive thrombus in the aneurysm segment of the cephalic vein at the end of the procedure. IMPRESSION: Successful declot of the left brachiocephalic fistula using catheter directed thrombolysis and thrombectomy. Balloon angioplasty of the left cephalic vein. Electronically Signed   By: Markus Daft M.D.   On: 01/27/2019 17:36    Labs:  CBC: Recent Labs    06/05/18 0830 09/11/18 0800 01/27/19 1150 01/27/19 1251 01/31/19 1030  WBC 4.9 6.6 6.2  --  6.0  HGB 11.5* 11.9* 10.6* 11.2* 10.0*  HCT 38.8 36.9 32.8* 33.0* 31.8*  PLT 181 PLATELET CLUMPS NOTED ON SMEAR, UNABLE TO ESTIMATE 200  --  247    COAGS: Recent Labs    05/08/18 0933 06/05/18 0830  01/27/19 1150  INR 1.15 1.14 1.2  APTT 34  --   --     BMP: Recent Labs    02/15/18 0527 02/16/18 0442 06/05/18 0830 09/11/18 0800  01/27/19 1251  NA 139 135 140 139 134*  K 4.0 4.2 4.5 5.4* 5.8*  CL 99 95* 97* 98 101  CO2 27 26 28 30   --   GLUCOSE 109* 147* 154* 76 95  BUN 24* 46* 38* 31* 136*  CALCIUM 8.8* 8.7* 9.2 9.4  --   CREATININE 4.55* 6.84* 5.95* 6.34* 15.00*  GFRNONAA 9* 6* 7* 6*  --   GFRAA 10* 6* 8* 7*  --     LIVER FUNCTION TESTS: Recent Labs    02/10/18 2106 02/11/18 0940 02/12/18 0418  BILITOT 0.8 0.7  --   AST 24 24  --   ALT 18 17  --   ALKPHOS 137* 106  --   PROT 6.9 6.5  --   ALBUMIN 3.3* 2.8* 2.5*    TUMOR MARKERS: No results for input(s): AFPTM, CEA, CA199, CHROMGRNA in the last 8760 hours.  Assessment and Plan:  68 y/o F with history of ESRD on HD via left upper extremity AVF typically T/Th/S - was Tuesday 7/21. Per son HD was not attempted as planned on 7/28 due to being told that the fistula wouldn't work after staff palpated it. She presents to IR today for a repeat fistulagram with possible intervention/possible HD catheter placement.   Patient has been NPO since last - son and patient are unsure what time but they assure me it was before midnight, she does not take blood thinning medications. Afebrile, WBC 6.0, hgb 10.0, plt 247, K+ 6.1, creatinine 14.20. RN providing sedation is aware of potassium and will likely proceed with fentanyl only.  Risks and benefits discussed with the patient including, but not limited to bleeding, infection, vascular injury, pulmonary embolism, need for tunneled HD catheter placement or even death.  All of the patient's questions were answered, patient is agreeable to proceed.  Consent signed and in chart.  Thank you for this interesting consult.  I greatly enjoyed meeting [facility] and look forward to participating in their care.  A copy of this report was sent to the requesting provider on this date.  Electronically Signed: Joaquim Nam, PA-C 01/31/2019, 11:46 AM   I spent a total of15 Minutes in face to face in  clinical consultation, greater than 50% of which was counseling/coordinating care for fistulagram with possible intervention/possible HD catheter placement.

## 2019-01-31 NOTE — Progress Notes (Signed)
Discharge instructions given to son. Kayexalate sent home with patient with instructions to take on arrival to home.

## 2019-01-31 NOTE — Discharge Instructions (Addendum)
Fistulografa de dilisis, cuidados posteriores Dialysis Fistulogram, Care After Esta hoja le proporciona informacin sobre cmo cuidarse despus del procedimiento. Su mdico tambin podr darle instrucciones ms especficas. Comunquese con su mdico si tiene problemas o preguntas. Qu puedo esperar despus del procedimiento? Despus del procedimiento, es comn [facility] siguientes sntomas:  Una pequea molestia en la zona en la que se coloc el tubo delgado y pequeo (catter) para el procedimiento.  Un pequeo hematoma alrededor de la fstula.  Somnolencia y cansancio Company secretary). Siga estas indicaciones en su casa: Actividad   Haga reposo en su casa y no levante objetos que pesen ms de 5 lb (2,3 kg) el da despus del procedimiento.  Reanude sus actividades normales segn lo indicado por el mdico. Pregntele al mdico qu actividades son seguras para usted.  No conduzca ni use maquinaria pesada mientras toma analgsicos recetados.  No conduzca durante 24horas si recibi un medicamento para ayudarlo a relajarse (sedante) durante el procedimiento. Medicamentos   [employer] de venta libre y los recetados solamente como se lo haya indicado el mdico. Cuidado del Environmental consultant de la puncin  Siga las indicaciones de su mdico acerca de cmo Government social research officer donde se insertaron los catteres. Asegrese de hacer lo siguiente: ? Lvese las manos con agua y jabn antes de Quarry manager las vendas (vendaje). Use desinfectante para manos si no dispone de [place]. ? Cambie el vendaje como se lo haya indicado el mdico. ? No retire los puntos (suturas), la goma para cerrar la piel o las tiras [place]. Es posible que estos cierres cutneos Animal nutritionist en la piel durante 2semanas o ms. Si los bordes de las tiras adhesivas empiezan a despegarse y Therapist, sports, puede recortar los que estn sueltos. No retire las tiras [facility] por completo a menos que el mdico se lo indique.  Controle  la zona de la puncin todos los das para detectar signos de infeccin. Est atento a los siguientes signos: ? Dolor, hinchazn o enrojecimiento. ? Lquido o sangre. ? Calor. ? Pus o mal olor. Instrucciones generales  No tome baos de inmersin, no nade ni use el jacuzzi hasta que el mdico lo autorice. Pregntele al mdico si puede ducharse. Thurston Pounds solo le permitan darse baos de [place].  Controle atentamente la fstula de dilisis. Verifique para asegurarse de que puede sentir una vibracin o un zumbido (frmito) cuando Risk manager los dedos sobre la fstula.  Evite daar el injerto o la fstula: ? No use ropa ajustada ni joyas en el brazo o la pierna que tiene el injerto o la fstula. ? Informe a todos sus mdicos que tiene un injerto o una fstula para dilisis. ? No permita extracciones de sangre, terapias intravenosas ni lecturas de la presin arterial en el brazo que tiene la fstula o el injerto. ? No permita que le apliquen vacunas antigripales ni otras vacunas en el brazo con la fstula o el injerto.  Concurra a todas las visitas de seguimiento como se lo haya indicado el mdico. Esto es importante. Comunquese con un mdico si:  Tiene enrojecimiento, hinchazn o dolor en el lugar donde se insert el catter.  Observa lquido o sangre que proviene del lugar de la insercin del catter.  El lugar de la insercin del catter est caliente al tacto.  Tiene pus o percibe mal olor que proviene del lugar de la insercin del catter.  Tiene fiebre o siente escalofros. Solicite ayuda de inmediato si:  Se siente dbil.  Tiene problemas de equilibrio.  Tiene dificultad para mover los brazos o las piernas.  Tiene problemas visuales o para hablar.  Ya no puede sentir una vibracin o un zumbido cuando [employer] dedos sobre la fstula de dilisis.  La extremidad que se [place] para el procedimiento: ? Se hincha. ? Duele. ? Est fra. ? Cambia de color, por ejemplo, se torna  azulada o blanco plido.  Siente falta de aire o Tourist information centre manager. Resumen  Despus de [place] fistulografa de dilisis, es comn tener una pequea molestia o algunos moretones en la zona donde se coloc el tubo delgado y pequeo (catter).  Descanse en su [place] despus del procedimiento. Reanude sus actividades normales segn lo indicado por el mdico.  [employer] de venta libre y los recetados solamente como se lo haya indicado el mdico.  Siga las indicaciones de su mdico acerca de cmo Government social research officer donde se insert el catter.  Concurra a todas las visitas de seguimiento como se lo haya indicado el mdico. Esta informacin no tiene [name] el consejo del mdico. Asegrese de hacerle al mdico cualquier pregunta que tenga. Document Released: 11/03/2013 Document Revised: 08/22/2017 Document Reviewed: 08/22/2017 Elsevier Patient Education  2020 [place].    Dialysis Fistulogram, Care After This sheet gives you information about how to care for yourself after your procedure. Your health care provider may also give you more specific instructions. If you have problems or questions, contact your health care provider. What can I expect after the procedure? After the procedure, it is common to have:  A small amount of discomfort in the area where the small, thin tube (catheter) was placed for the procedure.  A small amount of bruising around the fistula.  Sleepiness and tiredness (fatigue). Follow these instructions at home: Activity   Rest at home and do not lift anything that is heavier than 5 lb (2.3 kg) on the day after your procedure.  Return to your normal activities as told by your health care provider. Ask your health care provider what activities are safe for you.  Do not drive or use heavy machinery while taking prescription pain medicine.  Do not drive for 24 hours if you were given a medicine to help you relax (sedative) during your  procedure. Medicines   Take over-the-counter and prescription medicines only as told by your health care provider. Puncture site care  Follow instructions from your health care provider about how to take care of the site where catheters were inserted. Make sure you: ? Wash your hands with soap and water before you change your bandage (dressing). If soap and water are not available, use hand sanitizer. ? Change your dressing as told by your health care provider. ? Leave stitches (sutures), skin glue, or adhesive strips in place. These skin closures may need to stay in place for 2 weeks or longer. If adhesive strip edges start to loosen and curl up, you may trim the loose edges. Do not remove adhesive strips completely unless your health care provider tells you to do that.  Check your puncture area every day for signs of infection. Check for: ? Redness, swelling, or pain. ? Fluid or blood. ? Warmth. ? Pus or a bad smell. General instructions  Do not take baths, swim, or use a hot tub until your health care provider approves. Ask your health care provider if you may take showers. You may only be allowed to take sponge baths.  Monitor your dialysis fistula closely.  Check to make sure that you can feel a vibration or buzz (a thrill) when you put your fingers over the fistula.  Prevent damage to your graft or fistula: ? Do not wear tight-fitting clothing or jewelry on the arm or leg that has your graft or fistula. ? Tell all your health care providers that you have a dialysis fistula or graft. ? Do not allow blood draws, IVs, or blood pressure readings to be done in the arm that has your fistula or graft. ? Do not allow flu shots or vaccinations in the arm with your fistula or graft.  Keep all follow-up visits as told by your health care provider. This is important. Contact a health care provider if:  You have redness, swelling, or pain at the site where the catheter was put in.  You have  fluid or blood coming from the catheter site.  The catheter site feels warm to the touch.  You have pus or a bad smell coming from the catheter site.  You have a fever or chills. Get help right away if:  You feel weak.  You have trouble balancing.  You have trouble moving your arms or legs.  You have problems with your speech or vision.  You can no longer feel a vibration or buzz when you put your fingers over your dialysis fistula.  The limb that was used for the procedure: ? Swells. ? Is painful. ? Is cold. ? Is discolored, such as blue or pale white.  You have chest pain or shortness of breath. Summary  After a dialysis fistulogram, it is common to have a small amount of discomfort or bruising in the area where the small, thin tube (catheter) was placed.  Rest at home on the day after your procedure. Return to your normal activities as told by your health care provider.  Take over-the-counter and prescription medicines only as told by your health care provider.  Follow instructions from your health care provider about how to take care of the site where the catheter was inserted.  Keep all follow-up visits as told by your health care provider. This information is not intended to replace advice given to you by your health care provider. Make sure you discuss any questions you have with your health care provider. Document Released: 11/03/2013 Document Revised: 07/20/2017 Document Reviewed: 07/20/2017 Elsevier Patient Education  2020 [place].   Dialysis Vascular Access Malfunction        A vascular access is an entrance to your blood vessels that can be used for dialysis. Dialysis is a treatment used for kidney failure. A health care provider can make a vascular access in many ways, such as by:  Joining an artery to a vein under your skin to make a bigger blood vessel called a fistula.  Joining an artery to a vein under your skin using a soft tube called a  graft.  Placing a thin, flexible tube (catheter) in a large vein, usually in your neck, chest, or groin. A vascular access can become blocked or stop working correctly (malfunction). What can cause my vascular access to malfunction?  Infection. This is the most common cause of malfunction.  A blood clot inside a part of the fistula, graft, or catheter. A blood clot can completely or partially block the flow of blood.  A kink in the graft or catheter.  A collection of blood (hematoma or bruise) next to the graft or catheter that pushes against it, blocking the flow  of blood. What are signs and symptoms of vascular access malfunction? Signs and symptoms of vascular access malfunction include:  A change in the vibration or pulse (thrill) of your fistula or graft.  The thrill of your fistula or graft being gone.  New or unusual swelling of the area around the access.  An unsuccessful puncture of your access by the dialysis team.  The flow of blood through the fistula, graft, or catheter being too slow for effective dialysis.  Bleeding that cannot be easily controlled when routine dialysis is completed and the needle is removed.  Signs of infection such as pain, swelling, redness, red streaks, numbness, and blood or pus coming from or around the access. What happens if my vascular access malfunctions? If your vascular access malfunctions, your health care provider may order blood work, cultures, or an X-ray test to find out what went wrong. The X-ray test involves the injection of a dye (contrast) into the vascular access. The contrast shows up on the X-ray and lets your health care provider see if there is a blockage in the vascular access. Treatment varies depending on the cause of the malfunction:  If the vascular access is infected, your health care provider may prescribe antibiotic medicine to control the infection.  If a clot is found in the vascular access, you may need surgery  to remove the clot. Blood thinning medicines may also be used.  If a blockage in the vascular access is due to some other cause, such as a kink in a graft, you will likely need surgery to unblock or replace the graft.  If there is a malfunction for any reason, your health care provider may remove and replace your access. Follow these instructions at home: Medicines  Take over-the-counter and prescription medicines only as told by your health care provider.  If you were prescribed an antibiotic medicine, use it as told by your health care provider. Do not stop using the antibiotic even if you start to feel better. Activity  Do not lift heavy things with the arm that has the access.  Try not to bump or hurt the arm that has the access. Lifestyle  Do not wear jewelry or tight clothing around the access.  Do not sleep by placing the access arm under your head or body. General instructions  Wash your hands with soap and water before touching the area around the vascular access. If soap and water are not available, use hand sanitizer.  Keep all follow-up visits as told by your health care provider. This is very important. Any delay in follow-up could cause permanent dysfunction of the vascular access, which may be dangerous.  Do not measure your blood pressure on the arm that has the access.  Keep the access area clean and do not use makeup, creams, lotions, or ointments on it.  Check your access area every day for signs of infection. Check for: ? More redness, swelling, or pain. ? More fluid or blood. ? Warmth. ? Pus or a bad smell. Contact a health care provider if:  Swelling around the vascular access gets worse.  You develop new pain. Get help right away if:  You have bleeding at the vascular access that cannot be easily controlled.  You have pain, numbness, an unusual pale skin color, or blue fingers or sores at the tips of your fingers in the hand on the side of your  fistula.  You have chills.  You have a fever.  You have  pus or other fluid (drainage) at the vascular access site.  You develop skin redness or red streaking on the skin around, above, or below the vascular access.  Your access is hot, swollen, red, and very painful.  You can suddenly see the cuff of your catheter used in the access. Summary  A vascular access is an entrance to your blood vessels that can be used for dialysis.  Several things can cause your vascular access to malfunction.  Treatment varies depending on the cause of the malfunction.  Keep all follow-up visits as told by your health care provider. Any delay in follow-up could cause permanent dysfunction of the vascular access, which may be dangerous. This information is not intended to replace advice given to you by your health care provider. Make sure you discuss any questions you have with your health care provider. Document Released: 05/22/2006 Document Revised: 09/14/2017 Document Reviewed: 07/14/2016 Elsevier Patient Education  [place]. Moderate Conscious Sedation, Adult, Care After These instructions provide you with information about caring for yourself after your procedure. Your health care provider may also give you more specific instructions. Your treatment has been planned according to current medical practices, but problems sometimes occur. Call your health care provider if you have any problems or questions after your procedure. What can I expect after the procedure? After your procedure, it is common:  To feel sleepy for several hours.  To feel clumsy and have poor balance for several hours.  To have poor judgment for several hours.  To vomit if you eat too soon. Follow these instructions at home: For at least 24 hours after the procedure:   Do not: ? Participate in activities where you could fall or become injured. ? Drive. ? Use heavy machinery. ? Drink alcohol. ? Take sleeping  pills or medicines that cause drowsiness. ? Make important decisions or sign legal documents. ? Take care of children on your own.  Rest. Eating and drinking  Follow the diet recommended by your health care provider.  If you vomit: ? Drink water, juice, or soup when you can drink without vomiting. ? Make sure you have little or no nausea before eating solid foods. General instructions  Have a responsible adult stay with you until you are awake and alert.  Take over-the-counter and prescription medicines only as told by your health care provider.  If you smoke, do not smoke without supervision.  Keep all follow-up visits as told by your health care provider. This is important. Contact a health care provider if:  You keep feeling nauseous or you keep vomiting.  You feel light-headed.  You develop a rash.  You have a fever. Get help right away if:  You have trouble breathing. This information is not intended to replace advice given to you by your health care provider. Make sure you discuss any questions you have with your health care provider. Document Released: 04/09/2013 Document Revised: 06/01/2017 Document Reviewed: 10/09/2015 Elsevier Patient Education  2020 [name].

## 2019-01-31 NOTE — Procedures (Signed)
  Procedure: DECLOT L arm HD fistula   EBL:   minimal Complications:  none immediate  See full dictation in [facility].  Dillard Cannon MD Main # [phone] Pager  [phone]

## 2019-01-31 NOTE — Progress Notes (Signed)
Assumed care of patient. LUA gauze dsg x 2 CDI at this time. +thrill/+bruit.

## 2019-02-01 ENCOUNTER — Ambulatory Visit (HOSPITAL_COMMUNITY)
Admission: EM | Admit: 2019-02-01 | Discharge: 2019-02-01 | Disposition: A | Discharge status: Home / Self Care | Payer: Self-pay | Attending: Emergency Medicine | Admitting: Emergency Medicine

## 2019-02-01 ENCOUNTER — Other Ambulatory Visit (HOSPITAL_COMMUNITY): Payer: Self-pay | Admitting: Nephrology

## 2019-02-01 ENCOUNTER — Emergency Department (HOSPITAL_COMMUNITY)
Admission: EM | Admit: 2019-02-01 | Discharge: 2019-02-01 | Disposition: A | Discharge status: Other Acute Inpatient Hospital | Payer: Self-pay | Attending: Emergency Medicine | Admitting: Emergency Medicine

## 2019-02-01 ENCOUNTER — Ambulatory Visit (HOSPITAL_COMMUNITY)
Admission: RE | Admit: 2019-02-01 | Discharge: 2019-02-01 | Disposition: A | Discharge status: Home / Self Care | Payer: Self-pay | Source: Ambulatory Visit | Attending: Nephrology | Admitting: Nephrology

## 2019-02-01 ENCOUNTER — Other Ambulatory Visit: Payer: Self-pay

## 2019-02-01 ENCOUNTER — Encounter (HOSPITAL_COMMUNITY): Payer: Self-pay | Admitting: Interventional Radiology

## 2019-02-01 DIAGNOSIS — Z992 Dependence on renal dialysis: Secondary | ICD-10-CM | POA: Insufficient documentation

## 2019-02-01 DIAGNOSIS — N186 End stage renal disease: Secondary | ICD-10-CM

## 2019-02-01 DIAGNOSIS — T82590A Other mechanical complication of surgically created arteriovenous fistula, initial encounter: Secondary | ICD-10-CM

## 2019-02-01 HISTORY — PX: IR US GUIDE VASC ACCESS RIGHT: IMG2390

## 2019-02-01 HISTORY — PX: IR US GUIDE VASC ACCESS LEFT: IMG2389

## 2019-02-01 HISTORY — PX: IR FLUORO GUIDE CV LINE RIGHT: IMG2283

## 2019-02-01 MED ORDER — IOHEXOL 300 MG/ML  SOLN
50.0000 mL | Freq: Once | INTRAMUSCULAR | Status: AC | PRN
  Administered 2019-02-01: 10 mL via INTRAVENOUS

## 2019-02-01 MED ORDER — LIDOCAINE HCL (PF) 1 % IJ SOLN
INTRAMUSCULAR | Status: AC | PRN
  Administered 2019-02-01: 10 mL

## 2019-02-01 MED ORDER — CEFAZOLIN SODIUM-DEXTROSE 2-4 GM/100ML-% IV SOLN
INTRAVENOUS | Status: AC
  Filled 2019-02-01: qty 100

## 2019-02-01 MED ORDER — FENTANYL CITRATE (PF) 100 MCG/2ML IJ SOLN
INTRAMUSCULAR | Status: AC | PRN
  Administered 2019-02-01 (×2): 25 ug via INTRAVENOUS

## 2019-02-01 MED ORDER — CEFAZOLIN SODIUM-DEXTROSE 2-4 GM/100ML-% IV SOLN
2.0000 g | Freq: Once | INTRAVENOUS | Status: AC
  Administered 2019-02-01: 2 g via INTRAVENOUS

## 2019-02-01 NOTE — Procedures (Signed)
  Procedure: Tunneled HD Catheter R IJ Palindrome 19 EBL:   minimal Complications:  none immediate  See full dictation in [facility].  Dillard Cannon MD Main # [phone] Pager  [phone]

## 2019-02-01 NOTE — Discharge Instructions (Addendum)
Acceso vascular para hemodilisis Vascular Access for Hemodialysis        Un acceso vascular es una conexin que permite que la sangre de los vasos sanguneos se pueda extraer fcilmente del cuerpo y pueda volver a introducirse en el cuerpo durante la dilisis renal (hemodilisis). La hemodilisis es un procedimiento en el que una mquina externa al cuerpo filtra la sangre de las personas cuyos riones ya no funcionan correctamente. Hay tres tipos de accesos vasculares:  Fstula arteriovenosa (FAV). Esta una conexin entre una arteria y Ardelia Mems vena (generalmente del brazo) que se realiza al suturarlas juntas. La sangre de la arteria fluye directamente hacia la vena y hace que con el tiempo la vena se agrande. Esto facilita el uso de la vena para la hemodilisis. La fstula arteriovenosa demora de 1 a 33meses en formarse despus de la ciruga.  Injerto arteriovenoso (IAV). Es una conexin entre una arteria y Ardelia Mems vena del brazo que se realiza con un tubo. El injerto arteriovenoso puede usarse en el trmino de 2 a 3semanas despus de la [name].  Catter venoso. Es un tubo flexible y delgado que se coloca en una vena principal (habitualmente del cuello, del trax o de la ingle). Un catter venoso para hemodilisis consta de dos tubos que salen de la piel. El catter venoso puede usarse inmediatamente. Suele usarse como acceso temporal cuando se necesita realizar hemodilisis antes de que se haya formado la fstula o el injerto, o si la insuficiencia renal es repentina [place]) y probablemente mejore sin necesidad de hemodilisis a Barrister's clerk. Tambin puede usarse como [employer] si no es posible crear una fstula o un injerto. Qu tipo de acceso es el ms adecuado para m? El tipo de acceso ms adecuado para usted depender del tamao y la resistencia de sus venas, de su edad y de cualquier otro problema de salud que tenga, por ejemplo, diabetes. Pueden realizarle una ecografa para observar las  venas y poder tomar esta decisin. Generalmente, la fstula es el tipo de acceso preferido. Puede durar varios aos y es menos probable que se infecte o que se forme un cogulo en un vaso sanguneo (trombosis) en comparacin con los otros tipos de acceso. Sin embargo, una fstula no es una opcin para todos. Si sus venas no tienen el tamao adecuado o la fstula no se forma como corresponde, podr usarse un injerto. Los injertos requieren venas fuertes. Si sus venas no son lo suficientemente fuertes para un injerto, se usar un catter. Hay ms probabilidades de infecciones o de trombosis con los catteres que con las fstulas o los injertos. En [employer], solo un tipo de acceso es la opcin. El mdico lo ayudar a Teacher, adult education qu tipo de acceso es el ms adecuado para usted. Cmo se utiliza un acceso vascular? El modo en que el acceso se [place] depende del tipo de acceso:  Si el acceso es una fstula o un injerto, se colocarn dos agujas a travs de la piel dentro del acceso antes de cada sesin de hemodilisis. La [employer] del organismo a travs de una de las agujas y pasa a travs del tubo hacia la mquina de hemodilisis (dializador). Luego, fluye a travs de otro tubo y regresa al organismo a travs de la [place].  Si el acceso es un catter, se conecta un tubo directamente al tubo que lleva al dializador y el otro se conecta a un tubo que sale de [place] aparato. La sangre deja el organismo a travs de  un tubo y vuelve al mismo a travs de otro. Qu tipo de problemas pueden aparecer con un acceso vascular?  Un cogulo de sangre en un vaso sanguneo (trombosis). La trombosis puede causar el estrechamiento de un vaso sanguneo (estenosis). Si la trombosis ocurre con frecuencia, se crear otro sitio de acceso como respaldo.  Infeccin.  Agrandamiento del corazn (cardiomegalia) e insuficiencia cardaca. Los cambios en el flujo sanguneo pueden aumentar la presin arterial o la  frecuencia cardaca, lo que hace que el corazn trabaje ms para bombear la [place]. Es ms probable que estos problemas ocurran con un catter venoso y menos probable que se presenten con una fstula arteriovenosa. Cmo debo cuidar el acceso vascular?  Use un brazalete de alerta mdico. En caso de emergencia, este brazalete les indica a los mdicos que usted es un paciente en dilisis y permite que cuiden de sus venas adecuadamente. Si tiene un injerto o una fstula:  Un "soplo" es un ruido que se escucha con un estetoscopio, y Ardelia Mems "vibracin" es un temblor que se siente sobre el injerto o la fstula. La presencia de un soplo o vibracin indican que el acceso est funcionando. Se le indicar que palpe la vibracin [employer]. Si no lo siente, puede haber un cogulo en el acceso. Comunquese con su mdico.  Mantenga el brazo derecho y elevado por encima del nivel del corazn mientras el lugar del acceso est cicatrizando.  Puede utilizar libremente el brazo donde tiene el acceso vascular despus de que el Chief Technology Officer. Tenga en cuenta lo siguiente: ? Evite la presin en el brazo. ? No levante objetos pesados con el brazo. ? No duerma sobre el brazo. ? No use camisas con mangas ajustadas ni alhajas alrededor del injerto o la fstula.  No deje que le tomen la presin arterial ni le hagan punciones con agujas del lado donde se encuentran el injerto o la fstula.  Con la autorizacin del mdico, podr hacer ejercicios para favorecer el flujo de sangre a travs de la fstula. Estos ejercicios consisten en apretar una pelota de goma o cualquier otro objeto blando segn las indicaciones.  Lave el sitio del acceso de acuerdo con las indicaciones del mdico. Si tiene un catter venoso:  Mantenga limpio y Higher education careers adviser de la insercin en todo momento.  Si le dijeron que puede ducharse despus de que el Chief Technology Officer, use una cubierta protectora sobre el catter para mantenerlo [place] indicaciones del mdico en cuanto a los cambios de las vendas (vendajes).  Pregntele al mdico qu actividades son seguras para usted. Es posible que no deba levantar objetos o Optometrist movimientos repetidos con Water engineer en el que tiene el catter. Comunquese con un mdico si:  La hinchazn alrededor del injerto o la fstula empeora.  Aparece un dolor nuevo.  El catter se daa. Solicite ayuda de inmediato si:  Scientist, clinical (histocompatibility and immunogenetics), entumecimiento, piel inusualmente plida, dedos azulados o llagas en la punta de los dedos de la mano del lado de la fstula.  Tiene escalofros.  Tiene fiebre.  Hay pus u otro lquido (drenaje) en el lugar del acceso vascular.  Hay enrojecimiento o lneas rojas en la piel alrededor, por encima o por debajo del acceso vascular.  Tiene sangrado en el acceso vascular que no puede controlarse fcilmente.  El catter se sali del Environmental consultant.  Siente que el corazn late rpidamente o faltan latidos.  Siente dolor en el pecho. Resumen  Un acceso vascular es una conexin  que permite que la sangre de los vasos sanguneos se pueda extraer fcilmente del cuerpo y pueda volver a introducirse en el cuerpo durante la dilisis renal (hemodilisis).  Hay tres tipos de [employer].El tipo de acceso ms adecuado para usted depender del tamao y la resistencia de sus venas, de su edad y de cualquier otro problema de salud que tenga, por ejemplo, diabetes.  Habitualmente, la fstula es el tipo de acceso preferido, si bien no es una opcin para todo el mundo. Puede durar varios aos y es menos probable que se infecte o que se forme un cogulo en un vaso sanguneo (trombosis) en comparacin con los otros tipos de acceso.  Use un brazalete de alerta mdico. En caso de emergencia, este brazalete les indica a los mdicos que usted es un paciente en dilisis. Esta informacin no tiene [name] el consejo del mdico. Asegrese de hacerle al mdico cualquier  pregunta que tenga. Document Released: 06/05/2012 Document Revised: 09/18/2017 Document Reviewed: 03/15/2017 Elsevier Patient Education  2020 [name].

## 2019-02-01 NOTE — ED Provider Notes (Signed)
MSE was initiated and I personally evaluated the patient and placed orders (if any) at  9:42 AM on February 01, 2019.  The patient appears stable so that the remainder of the MSE may be completed by another provider.  Patient erroneously placed in the ER - she is scheduled for a catheter placement with IR this morning. Airway intact and safe for transport.   Margarita Mail, PA-C 02/01/19 0944    Noemi Chapel, MD 02/04/19 2044

## 2019-02-01 NOTE — ED Notes (Signed)
Pt was sent by her dialysis center because her graft is not working. Pt is in no acute distress.

## 2019-02-01 NOTE — ED Triage Notes (Signed)
Patient sent from dialysis due to graft not working this am. Son reports that dialysis center couldn't access the new graft this am, patient reports minimal to no pain to the site

## 2019-02-01 NOTE — Sedation Documentation (Signed)
Discahrged in [place] with son. Awake and alert. In no distress

## 2019-02-03 MED FILL — ETHYL CHLORIDE AERO: 30 days supply | Qty: 104 | Fill #2

## 2019-02-11 ENCOUNTER — Other Ambulatory Visit: Payer: Self-pay | Admitting: Family Medicine

## 2019-02-11 DIAGNOSIS — E119 Type 2 diabetes mellitus without complications: Secondary | ICD-10-CM

## 2019-02-11 MED FILL — $LANTUS 100 UNITS/ML VIAL: 100 | 85 days supply | Qty: 30 | Fill #2

## 2019-02-11 MED FILL — ?DULoxetine HCL 60 MG CPEP: 60 | 30 days supply | Qty: 30 | Fill #1

## 2019-02-11 MED FILL — TRUEPLUS SYR 1ML 31GX5/16": 31G X 5/16" | 25 days supply | Qty: 100 | Fill #4

## 2019-02-11 MED FILL — TRUEPLUS SYR 1ML 31GX5/16: 31G X 5/16" | 25 days supply | Qty: 100 | Fill #4

## 2019-02-11 MED FILL — TRUEplus LANCETS 28G MISC: 30 days supply | Qty: 100 | Fill #0

## 2019-02-21 ENCOUNTER — Other Ambulatory Visit: Payer: Self-pay | Admitting: Family Medicine

## 2019-02-21 DIAGNOSIS — E7800 Pure hypercholesterolemia, unspecified: Secondary | ICD-10-CM

## 2019-02-21 MED FILL — GABAPENTIN 300 MG CAPSULE: 300 | 30 days supply | Qty: 60 | Fill #4

## 2019-02-21 MED FILL — ATORVASTATIN CALCIUM 40 MG: 40 | 30 days supply | Qty: 30 | Fill #0

## 2019-02-25 ENCOUNTER — Other Ambulatory Visit: Payer: Self-pay

## 2019-02-25 ENCOUNTER — Ambulatory Visit: Payer: Self-pay | Attending: Family Medicine | Admitting: Family Medicine

## 2019-03-18 MED FILL — DULoxetine HCL 60 MG CPEP: 60 | 30 days supply | Qty: 30 | Fill #2

## 2019-03-20 MED FILL — GABAPENTIN 300 MG CAPSULE: 300 | 30 days supply | Qty: 60 | Fill #5

## 2019-04-01 ENCOUNTER — Other Ambulatory Visit: Payer: Self-pay

## 2019-04-01 DIAGNOSIS — Z992 Dependence on renal dialysis: Secondary | ICD-10-CM

## 2019-04-01 DIAGNOSIS — N186 End stage renal disease: Secondary | ICD-10-CM

## 2019-04-02 ENCOUNTER — Other Ambulatory Visit: Payer: Self-pay | Admitting: Family Medicine

## 2019-04-02 DIAGNOSIS — E7800 Pure hypercholesterolemia, unspecified: Secondary | ICD-10-CM

## 2019-04-02 MED FILL — GABAPENTIN 300 MG CAPSULE: 300 | 30 days supply | Qty: 60 | Fill #5

## 2019-04-03 MED FILL — ATORVASTATIN CALCIUM 40 MG: 40 | 30 days supply | Qty: 30 | Fill #0

## 2019-04-04 ENCOUNTER — Ambulatory Visit (HOSPITAL_COMMUNITY): Payer: Self-pay | Attending: Family Medicine

## 2019-04-04 ENCOUNTER — Other Ambulatory Visit (HOSPITAL_COMMUNITY): Payer: Self-pay

## 2019-04-04 ENCOUNTER — Ambulatory Visit: Payer: Self-pay | Admitting: Vascular Surgery

## 2019-04-04 ENCOUNTER — Ambulatory Visit (HOSPITAL_COMMUNITY): Payer: Self-pay

## 2019-04-10 ENCOUNTER — Other Ambulatory Visit: Payer: Self-pay | Admitting: Family Medicine

## 2019-04-10 DIAGNOSIS — E1149 Type 2 diabetes mellitus with other diabetic neurological complication: Secondary | ICD-10-CM

## 2019-04-10 MED FILL — TRUEPLUS SYR 1ML 31GX5/16": 31G X 5/16" | 25 days supply | Qty: 100 | Fill #5

## 2019-04-10 MED FILL — TRUEPLUS SYR 1ML 31GX5/16: 31G X 5/16" | 25 days supply | Qty: 100 | Fill #5

## 2019-04-25 ENCOUNTER — Other Ambulatory Visit: Payer: Self-pay | Admitting: Family Medicine

## 2019-04-25 DIAGNOSIS — E1149 Type 2 diabetes mellitus with other diabetic neurological complication: Secondary | ICD-10-CM

## 2019-04-29 MED FILL — DULoxetine HCL 60 MG CPEP: 60 | 30 days supply | Qty: 30 | Fill #0

## 2019-04-29 MED FILL — LISINOPRIL 20 MG TABLET: 20 | 30 days supply | Qty: 30 | Fill #0

## 2019-05-02 ENCOUNTER — Encounter: Payer: Self-pay | Admitting: Internal Medicine

## 2019-05-02 ENCOUNTER — Ambulatory Visit: Payer: Self-pay | Attending: Internal Medicine | Admitting: Internal Medicine

## 2019-05-02 ENCOUNTER — Other Ambulatory Visit: Payer: Self-pay

## 2019-05-02 DIAGNOSIS — Z760 Encounter for issue of repeat prescription: Secondary | ICD-10-CM

## 2019-05-02 DIAGNOSIS — R1030 Lower abdominal pain, unspecified: Secondary | ICD-10-CM

## 2019-05-02 DIAGNOSIS — R300 Dysuria: Secondary | ICD-10-CM

## 2019-05-02 MED ORDER — CIPROFLOXACIN HCL 250 MG PO TABS: 250.0000 mg | ORAL_TABLET | Freq: Every day | ORAL | 0 refills | Status: DC

## 2019-05-02 MED ORDER — LISINOPRIL 20 MG PO TABS: 20.0000 mg | ORAL_TABLET | Freq: Every day | ORAL | 1 refills | Status: DC

## 2019-05-02 MED ORDER — ATORVASTATIN CALCIUM 40 MG PO TABS: 40.0000 mg | ORAL_TABLET | Freq: Every day | ORAL | 1 refills | Status: DC

## 2019-05-02 MED ORDER — GABAPENTIN 300 MG PO CAPS: 600.0000 mg | ORAL_CAPSULE | Freq: Every day | ORAL | 1 refills | Status: DC

## 2019-05-02 MED ORDER — CETIRIZINE HCL 10 MG PO TABS: 10.0000 mg | ORAL_TABLET | Freq: Every day | ORAL | 1 refills | Status: DC

## 2019-05-02 MED FILL — ATORVASTATIN CALCIUM 40 MG: 40 | 30 days supply | Qty: 30 | Fill #0

## 2019-05-02 MED FILL — $LANTUS 100 UNITS/ML VIAL: 100 | 85 days supply | Qty: 30 | Fill #3

## 2019-05-02 NOTE — Progress Notes (Signed)
Virtual Visit via Telephone Note Due to current restrictions/limitations of in-office visits due to the COVID-19 pandemic, this scheduled clinical appointment was converted to a telehealth visit  I connected with Freida Dripps on 05/02/19 at 4:16 p.m by telephone and verified that I am speaking with the correct person using two identifiers. I am in my office.  The patient is at home.  Only the patient, her daughter, myself  and Hassan Rowan [facility] [phone]) participated in this encounter.  Her daughter gives most of the history.  I discussed the limitations, risks, security and privacy concerns of performing an evaluation and management service by telephone and the availability of in person appointments. I also discussed with the patient that there may be a patient responsible charge related to this service. The patient expressed understanding and agreed to proceed.   History of Present Illness: Pt with hx of ESRD, HTN, DM, HLD.  PCP is Dr. Margarita Rana.  Last seen 09/2018.  Pt needed RF on meds.  Last saw Dr. Margarita Rana 09/2018  Daughter is requesting refills on several of patient's medications including Zyrtec, and Lipitor.  She states she could not get further refills until she was seen.  They apparently came today to be seen in person but apparently was turned away at the front door and was told that the appointment was a telephone visit.   C/o having lower abdominal pain x1 wk. Some burning on urination x 2-3 days. No hematuria.  No fever.  No Vomiting or diarrhea.  She has had UTI before and feels similar.  Outpatient Encounter Medications as of 05/02/2019  Medication Sig  . Amino Acids-Protein Hydrolys (FEEDING SUPPLEMENT, PRO-STAT SUGAR FREE 64,) LIQD Take 30 mLs by mouth 2 (two) times daily.  Marland Kitchen atorvastatin (LIPITOR) 40 MG tablet Take 1 tablet (40 mg total) by mouth daily. Must have office visit for refills  . bacitracin 500 UNIT/GM ointment Apply 1 application topically 2 (two)  times daily.  . Blood Glucose Monitoring Suppl (TRUE METRIX METER) DEVI 1 each by Does not apply route 3 (three) times daily before meals.  . cephALEXin (KEFLEX) 500 MG capsule TAKE 1 CAPSULE (500 MG TOTAL) BY MOUTH DAILY. GIVE AFTER HEMODIALYSIS ON DIALYSIS DAYS.  Marland Kitchen cetirizine (ZYRTEC) 10 MG tablet Take 1 tablet (10 mg total) by mouth daily.  . DULoxetine (CYMBALTA) 60 MG capsule TAKE 1 CAPSULE BY MOUTH DAILY.  Marland Kitchen gabapentin (NEURONTIN) 300 MG capsule Take 1 capsule (300 mg total) by mouth at bedtime.  . gabapentin (NEURONTIN) 300 MG capsule TAKE 2 CAPSULES (600 MG TOTAL) BY MOUTH AT BEDTIME.  Marland Kitchen glucose blood (TRUE METRIX BLOOD GLUCOSE TEST) test strip Use 3 times daily before meals  . insulin glargine (LANTUS) 100 UNIT/ML injection Inject subcutaneously twice daily 20 units in the morning and 15 units in the evening  . lisinopril (ZESTRIL) 20 MG tablet TAKE 1 TABLET (20 MG TOTAL) BY MOUTH DAILY.  Marland Kitchen TRUEPLUS INSULIN SYRINGE 31G X 5/16" 1 ML MISC USE AS DIRECTED  . TRUEplus Lancets 28G MISC USE AS DIRECTED THREE TIMES DAILY TO TEST SUGAR BEFORE MEALS   Facility-Administered Encounter Medications as of 05/02/2019  Medication  . 0.9 %  sodium chloride infusion      Observations/Objective: No direct observation done as this was a telephone encounter.  Assessment and Plan: 1. Medication refill Refills given on gabapentin, Lipitor, Zyrtec  2. Dysuria Possible UTI.  Will treat empirically - ciprofloxacin (CIPRO) 250 MG tablet; Take 1 tablet (250 mg total) by mouth  daily.  Dispense: 6 tablet; Refill: 0   Follow Up Instructions: Follow-up with her PCP in 2 weeks in-person   I discussed the assessment and treatment plan with the patient. The patient was provided an opportunity to ask questions and all were answered. The patient agreed with the plan and demonstrated an understanding of the instructions.   The patient was advised to call back or seek an in-person evaluation if the symptoms  worsen or if the condition fails to improve as anticipated.  I provided  51minutes of non-face-to-face time during this encounter.   Karle Plumber, MD

## 2019-05-02 NOTE — Progress Notes (Signed)
Pt son states when pt starts to get a uti she has this pain of urgency

## 2019-05-05 MED FILL — GABAPENTIN 300 MG CAPSULE: 300 | 30 days supply | Qty: 60 | Fill #0

## 2019-05-05 MED FILL — CIPROFLOXACIN HCL 250 MG TA: 250 | 6 days supply | Qty: 6 | Fill #0

## 2019-05-13 ENCOUNTER — Other Ambulatory Visit: Payer: Self-pay

## 2019-05-13 ENCOUNTER — Ambulatory Visit: Payer: Self-pay | Attending: Family Medicine | Admitting: Family Medicine

## 2019-05-13 ENCOUNTER — Encounter: Payer: Self-pay | Admitting: Family Medicine

## 2019-05-13 VITALS — BP 146/81 | HR 69 | Temp 98.2°F | Ht 60.0 in | Wt 155.0 lb

## 2019-05-13 DIAGNOSIS — N186 End stage renal disease: Secondary | ICD-10-CM

## 2019-05-13 DIAGNOSIS — Z794 Long term (current) use of insulin: Secondary | ICD-10-CM

## 2019-05-13 DIAGNOSIS — Z992 Dependence on renal dialysis: Secondary | ICD-10-CM

## 2019-05-13 DIAGNOSIS — I10 Essential (primary) hypertension: Secondary | ICD-10-CM

## 2019-05-13 DIAGNOSIS — E119 Type 2 diabetes mellitus without complications: Secondary | ICD-10-CM

## 2019-05-13 DIAGNOSIS — L603 Nail dystrophy: Secondary | ICD-10-CM

## 2019-05-13 DIAGNOSIS — K5909 Other constipation: Secondary | ICD-10-CM

## 2019-05-13 DIAGNOSIS — E1149 Type 2 diabetes mellitus with other diabetic neurological complication: Secondary | ICD-10-CM

## 2019-05-13 DIAGNOSIS — E1122 Type 2 diabetes mellitus with diabetic chronic kidney disease: Secondary | ICD-10-CM

## 2019-05-13 LAB — POCT GLYCOSYLATED HEMOGLOBIN (HGB A1C): HbA1c, POC (controlled diabetic range): 9 % — AB (ref 0.0–7.0)

## 2019-05-13 LAB — GLUCOSE, POCT (MANUAL RESULT ENTRY): POC Glucose: 294 mg/dl — AB (ref 70–99)

## 2019-05-13 MED ORDER — DULOXETINE HCL 60 MG PO CPEP: 60.0000 mg | ORAL_CAPSULE | Freq: Every day | ORAL | 6 refills | Status: DC

## 2019-05-13 MED ORDER — ATORVASTATIN CALCIUM 40 MG PO TABS: 40.0000 mg | ORAL_TABLET | Freq: Every day | ORAL | 6 refills | Status: DC

## 2019-05-13 MED ORDER — GABAPENTIN 300 MG PO CAPS: 600.0000 mg | ORAL_CAPSULE | Freq: Every day | ORAL | 6 refills | Status: DC

## 2019-05-13 MED ORDER — POLYETHYLENE GLYCOL 3350 17 GM/SCOOP PO POWD: 17.0000 g | Freq: Two times a day (BID) | ORAL | 1 refills | Status: DC | PRN

## 2019-05-13 MED ORDER — LISINOPRIL 20 MG PO TABS: 20.0000 mg | ORAL_TABLET | Freq: Every day | ORAL | 6 refills | Status: DC

## 2019-05-13 MED ORDER — INSULIN GLARGINE 100 UNIT/ML ~~LOC~~ SOLN: SUBCUTANEOUS | 5 refills | Status: DC

## 2019-05-13 MED FILL — POLYETHYLENE GLYCOL 3350 PO: 17 | 7 days supply | Qty: 238 | Fill #0

## 2019-05-13 NOTE — Progress Notes (Signed)
Subjective:  Patient ID: Sarah Kelley, female    DOB: May 31, 1951  Age: 68 y.o. 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  She was seen by Dr. Wynetta Emery last month for UTI and has completed a course of Cipro with resolution of symptoms.  She does complain of constipation and feels her abdomen is bloated.  Denies nausea, vomiting, diarrhea. With regards to her diabetes she endorses compliance with her insulin but is unable to give me blood sugar readings. She has also been compliant with her antihypertensive and hemodialysis sessions are going well. Her appetite is good and she has no additional concerns today.   Past Medical History:  Diagnosis Date   Diabetes mellitus    ESRD (end stage renal disease) ([place])    Hyperlipidemia    Hypertension    Neuropathy    Diabetic   Pneumonia 07/2016   Renal disorder    Sepsis ([place])    Thyroid disease     Past Surgical History:  Procedure Laterality Date   AMPUTATION Left 05/21/2015   Procedure: Left Foot 3rd Ray Amputation;  Surgeon: Newt Minion, MD;  Location: [place];  Service: Orthopedics;  Laterality: Left;   AV FISTULA PLACEMENT Left 03/28/2016   Procedure: ARTERIOVENOUS (AV) FISTULA CREATION LEFT ARM;  Surgeon: Waynetta Sandy, MD;  Location: [place];  Service: Vascular;  Laterality: Left;   DILATION AND CURETTAGE OF UTERUS     FISTULA SUPERFICIALIZATION Left 09/04/2016   Procedure: FISTULA SUPERFICIALIZATION LEFT UPPER ARM;  Surgeon: Waynetta Sandy, MD;  Location: [place];  Service: Vascular;  Laterality:  Left;   IR AV DIALY SHUNT INTRO NEEDLE/INTRAC INITIAL W/PTA/STENT/IMG LT Left 06/13/2017   IR AV DIALY SHUNT INTRO NEEDLE/INTRAC INITIAL W/PTA/STENT/IMG LT Left 09/12/2017   IR AV DIALY SHUNT INTRO NEEDLE/INTRACATH INITIAL W/PTA/IMG LEFT  11/10/2016   IR AV DIALY SHUNT INTRO NEEDLE/INTRACATH INITIAL W/PTA/IMG LEFT  03/09/2017   IR AV DIALY SHUNT INTRO NEEDLE/INTRACATH INITIAL W/PTA/IMG LEFT  12/12/2017   IR AV DIALY SHUNT INTRO NEEDLE/INTRACATH INITIAL W/PTA/IMG LEFT  02/27/2018   IR AV DIALY SHUNT INTRO NEEDLE/INTRACATH INITIAL W/PTA/IMG LEFT  06/05/2018   IR AV DIALY SHUNT INTRO NEEDLE/INTRACATH INITIAL W/PTA/IMG LEFT  09/11/2018   IR DIALY SHUNT INTRO NEEDLE/INTRACATH INITIAL W/IMG LEFT Left 05/08/2018   IR FLUORO GUIDE CV LINE RIGHT  02/01/2019   IR GENERIC HISTORICAL  08/01/2016   IR FLUORO GUIDE CV LINE RIGHT 08/01/2016 [name], MD [facility]   IR GENERIC HISTORICAL  08/01/2016   IR US GUIDE VASC ACCESS RIGHT 08/01/2016 [name], MD [facility]   IR REMOVAL TUN CV CATH W/O FL  11/01/2016   IR THROMBECTOMY AV FISTULA W/THROMBOLYSIS INC/SHUNT/IMG LEFT Left 01/31/2019   IR THROMBECTOMY AV FISTULA W/THROMBOLYSIS/PTA INC/SHUNT/IMG LEFT Left 01/27/2019   IR US GUIDE VASC ACCESS LEFT  11/10/2016   IR US GUIDE VASC ACCESS LEFT  09/12/2017   IR US GUIDE VASC ACCESS LEFT  12/12/2017   IR US GUIDE VASC ACCESS LEFT  06/05/2018   IR US GUIDE VASC ACCESS LEFT  09/11/2018   IR US GUIDE VASC ACCESS LEFT  01/27/2019  IR US GUIDE VASC ACCESS LEFT  02/01/2019   IR US GUIDE VASC ACCESS RIGHT  02/01/2019    History reviewed. No pertinent family history.  Allergies  Allergen Reactions   No Known Allergies     Outpatient Medications Prior to Visit  Medication Sig Dispense Refill   Amino Acids-Protein Hydrolys (FEEDING SUPPLEMENT, PRO-STAT SUGAR FREE 64,) LIQD Take 30 mLs by mouth 2 (two) times daily. 900 mL 0   Blood Glucose Monitoring Suppl (TRUE METRIX METER) DEVI 1 each by Does not  apply route 3 (three) times daily before meals. 1 Device 0   cetirizine (ZYRTEC) 10 MG tablet Take 1 tablet (10 mg total) by mouth daily. 30 tablet 1   ciprofloxacin (CIPRO) 250 MG tablet Take 1 tablet (250 mg total) by mouth daily. 6 tablet 0   glucose blood (TRUE METRIX BLOOD GLUCOSE TEST) test strip Use 3 times daily before meals 100 each 12   TRUEPLUS INSULIN SYRINGE 31G X 5/16" 1 ML MISC USE AS DIRECTED 100 each 5   TRUEplus Lancets 28G MISC USE AS DIRECTED THREE TIMES DAILY TO TEST SUGAR BEFORE MEALS 100 each 12   atorvastatin (LIPITOR) 40 MG tablet Take 1 tablet (40 mg total) by mouth daily. Must have office visit for refills 30 tablet 1   DULoxetine (CYMBALTA) 60 MG capsule TAKE 1 CAPSULE BY MOUTH DAILY. 30 capsule 0   gabapentin (NEURONTIN) 300 MG capsule Take 2 capsules (600 mg total) by mouth at bedtime. 60 capsule 1   insulin glargine (LANTUS) 100 UNIT/ML injection Inject subcutaneously twice daily 20 units in the morning and 15 units in the evening 30 mL 5   lisinopril (ZESTRIL) 20 MG tablet Take 1 tablet (20 mg total) by mouth daily. 30 tablet 1   bacitracin 500 UNIT/GM ointment Apply 1 application topically 2 (two) times daily. (Patient not taking: Reported on 05/13/2019) 28 g 1   cephALEXin (KEFLEX) 500 MG capsule TAKE 1 CAPSULE (500 MG TOTAL) BY MOUTH DAILY. GIVE AFTER HEMODIALYSIS ON DIALYSIS DAYS. (Patient not taking: Reported on 05/13/2019) 7 capsule 0   Facility-Administered Medications Prior to Visit  Medication Dose Route Frequency Provider Last Rate Last Dose   0.9 %  sodium chloride infusion   Intravenous Continuous Monia Sabal, PA-C         ROS Review of Systems  Constitutional: Negative for activity change, appetite change and fatigue.  HENT: Negative for congestion, sinus pressure and sore throat.   Eyes: Negative for visual disturbance.  Respiratory: Negative for cough, chest tightness, shortness of breath and wheezing.   Cardiovascular: Negative  for chest pain and palpitations.  Gastrointestinal: Positive for constipation. Negative for abdominal distention and abdominal pain.  Endocrine: Negative for polydipsia.  Genitourinary: Negative for dysuria and frequency.  Musculoskeletal: Negative for arthralgias and back pain.  Skin: Negative for rash.  Neurological: Negative for tremors, light-headedness and numbness.  Hematological: Does not bruise/bleed easily.  Psychiatric/Behavioral: Negative for agitation and behavioral problems.    Objective:  BP (!) 146/81    Pulse 69    Temp 98.2 F (36.8 C) (Oral)    Ht 5' (1.524 m)    Wt 155 lb (70.3 kg)    LMP  (LMP Unknown)    SpO2 95%    BMI 30.27 kg/m   BP/Weight 05/13/2019 123456 123456  Systolic BP 123456 [id number] [id number]  Diastolic BP 81 61 58  Wt. (Lbs) 155 - -  BMI 30.27 - -      Physical Exam  Constitutional:      Appearance: She is well-developed.  Neck:     Vascular: No JVD.  Cardiovascular:     Rate and Rhythm: Normal rate.     Heart sounds: Normal heart sounds. No murmur.  Pulmonary:     Effort: Pulmonary effort is normal.     Breath sounds: Normal breath sounds. No wheezing or rales.  Chest:     Chest wall: No tenderness.  Abdominal:     General: Bowel sounds are normal. There is no distension.     Palpations: Abdomen is soft. There is no mass.     Tenderness: There is no abdominal tenderness.     Comments: Slight lower abdominal pain  Musculoskeletal: Normal range of motion.     Right lower leg: No edema.     Left lower leg: No edema.  Neurological:     Mental Status: She is alert and oriented to person, place, and time.  Psychiatric:        Mood and Affect: Mood normal.     CMP Latest Ref Rng & Units 01/31/2019 01/27/2019 09/11/2018  Glucose 70 - 99 mg/dL 199(H) 95 76  BUN 8 - 23 mg/dL 86(H) 136(H) 31(H)  Creatinine 0.44 - 1.00 mg/dL 14.20(H) 15.00(H) 6.34(H)  Sodium 135 - 145 mmol/L 136 134(L) 139  Potassium 3.5 - 5.1 mmol/L 6.1(H) 5.8(H) 5.4(H)  Chloride  98 - 111 mmol/L 97(L) 101 98  CO2 22 - 32 mmol/L 25 - 30  Calcium 8.9 - 10.3 mg/dL 9.0 - 9.4  Total Protein 6.5 - 8.1 g/dL - - -  Total Bilirubin 0.3 - 1.2 mg/dL - - -  Alkaline Phos 38 - 126 U/L - - -  AST 15 - 41 U/L - - -  ALT 0 - 44 U/L - - -    Lipid Panel     Component Value Date/Time   CHOL 171 09/10/2018 1231   TRIG 223 (H) 09/10/2018 1231   HDL 44 09/10/2018 1231   CHOLHDL 3.9 09/10/2018 1231   CHOLHDL 4.9 01/13/2016 1051   VLDL 66 (H) 01/13/2016 1051   LDLCALC 82 09/10/2018 1231    CBC    Component Value Date/Time   WBC 6.0 01/31/2019 1030   RBC 3.11 (L) 01/31/2019 1030   HGB 10.0 (L) 01/31/2019 1030   HCT 31.8 (L) 01/31/2019 1030   PLT 247 01/31/2019 1030   MCV 102.3 (H) 01/31/2019 1030   MCH 32.2 01/31/2019 1030   MCHC 31.4 01/31/2019 1030   RDW 15.9 (H) 01/31/2019 1030   LYMPHSABS 2.0 01/31/2019 1030   MONOABS 0.5 01/31/2019 1030   EOSABS 0.2 01/31/2019 1030   BASOSABS 0.0 01/31/2019 1030    Lab Results  Component Value Date   HGBA1C 9.0 (A) 05/13/2019    Assessment & Plan:   1. Type 2 diabetes mellitus with chronic kidney disease on chronic dialysis, with long-term current use of insulin (HCC) Uncontrolled with A1c of 9.0 Increased Lantus dose to 16 units twice daily Continue diabetic diet, lifestyle modifications - Glucose (CBG) - HgB A1c - atorvastatin (LIPITOR) 40 MG tablet; Take 1 tablet (40 mg total) by mouth daily.  Dispense: 30 tablet; Refill: 6  2. Dystrophic nail - Ambulatory referral to Podiatry  3. Other diabetic neurological complication associated with type 2 diabetes mellitus ([place]) Controlled on Cymbalta - Ambulatory referral to Podiatry - DULoxetine (CYMBALTA) 60 MG capsule; Take 1 capsule (60 mg total) by mouth daily.  Dispense: 30 capsule; Refill: 6  4.  Insulin-requiring or dependent type II diabetes mellitus ([place]) See #1 above - insulin glargine (LANTUS) 100 UNIT/ML injection; Inject subcutaneously twice daily 20 units  in the morning and 15 units in the evening  Dispense: 30 mL; Refill: 5  5. Other constipation Increase fiber intake - polyethylene glycol powder (GLYCOLAX/MIRALAX) 17 GM/SCOOP powder; Take 17 g by mouth 2 (two) times daily as needed.  Dispense: 3350 g; Refill: 1  6. Essential hypertension Slightly above goal No regimen change today to prevent hypotension during dialysis Counseled on blood pressure goal of less than 130/80, low-sodium, DASH diet, medication compliance, 150 minutes of moderate intensity exercise per week. Discussed medication compliance, adverse effects. - lisinopril (ZESTRIL) 20 MG tablet; Take 1 tablet (20 mg total) by mouth daily.  Dispense: 30 tablet; Refill: 6   Health Care Maintenance: discussed need for eye exam Meds ordered this encounter  Medications   polyethylene glycol powder (GLYCOLAX/MIRALAX) 17 GM/SCOOP powder    Sig: Take 17 g by mouth 2 (two) times daily as needed.    Dispense:  3350 g    Refill:  1   atorvastatin (LIPITOR) 40 MG tablet    Sig: Take 1 tablet (40 mg total) by mouth daily.    Dispense:  30 tablet    Refill:  6   DULoxetine (CYMBALTA) 60 MG capsule    Sig: Take 1 capsule (60 mg total) by mouth daily.    Dispense:  30 capsule    Refill:  6   gabapentin (NEURONTIN) 300 MG capsule    Sig: Take 2 capsules (600 mg total) by mouth at bedtime.    Dispense:  60 capsule    Refill:  6   insulin glargine (LANTUS) 100 UNIT/ML injection    Sig: Inject subcutaneously twice daily 20 units in the morning and 15 units in the evening    Dispense:  30 mL    Refill:  5    Discontinue previous dose   lisinopril (ZESTRIL) 20 MG tablet    Sig: Take 1 tablet (20 mg total) by mouth daily.    Dispense:  30 tablet    Refill:  6    Follow-up: Return in about 3 months (around 08/13/2019) for Diabetes.       [name], MD, FAAFP. [facility] [place], [place]   05/13/2019, 3:20 PM

## 2019-05-13 NOTE — Patient Instructions (Signed)
Estreimiento, en adultos Constipation, Adult Estreimiento significa que una persona defeca en una semana menos que lo normal, tiene dificultad para defecar, o las heces son secas, duras, o ms grandes que lo normal. El estreimiento podra estar provocado por una enfermedad preexistente. Puede empeorar con la edad si una persona toma ciertos medicamentos y no toma suficiente lquido. Siga estas indicaciones en su casa: Qu debe comer y beber   Consuma alimentos con alto contenido de [place], como frutas y verduras frescas, cereales integrales y frijoles.  Limite los alimentos ricos en grasas y con bajo contenido de fibra, o muy procesados, como las papas fritas, [place], [place], dulces y refrescos.  Beba suficiente lquido para Consulting civil engineer orina clara o de color amarillo plido. Instrucciones generales  Haga actividad fsica habitualmente o como se lo haya indicado el mdico.  Vaya al bao cuando sienta la necesidad de ir. No se aguante las ganas.  Tome los medicamentos de venta libre y los recetados solamente como se lo haya indicado el mdico. Estos incluyen los suplementos de Luis M. Cintron.  Practique ejercicios de rehabilitacin del suelo plvico, como la respiracin profunda mientras relaja la parte inferior del abdomen y la relajacin del suelo plvico mientras defeca.  Controle su afeccin para Actuary cambio.  Concurra a todas las visitas de seguimiento como se lo haya indicado el mdico. Esto es importante. Comunquese con un mdico si:  Su dolor empeora.  Tiene fiebre.  No defeca despus de 4das.  Vomita.  No tiene hambre.  Pierde peso.  Tiene una hemorragia en el ano.  Las heces son delgadas como un lpiz. Solicite ayuda de inmediato si:  Tiene fiebre y los sntomas empeoran repentinamente.  Observa que se filtran heces o hay sangre en las heces.  Tiene el abdomen distendido.  Siente un dolor intenso en el abdomen.  Se siente mareado o se  desmaya. Esta informacin no tiene [name] el consejo del mdico. Asegrese de hacerle al mdico cualquier pregunta que tenga. Document Released: 07/09/2007 Document Revised: 10/09/2016 Document Reviewed: 12/08/2015 Elsevier Patient Education  2020 [name].

## 2019-05-23 ENCOUNTER — Other Ambulatory Visit: Payer: Self-pay

## 2019-05-23 ENCOUNTER — Encounter: Payer: Self-pay | Admitting: Vascular Surgery

## 2019-05-23 ENCOUNTER — Ambulatory Visit (HOSPITAL_COMMUNITY)
Admission: RE | Admit: 2019-05-23 | Discharge: 2019-05-23 | Disposition: A | Discharge status: Home / Self Care | Payer: Self-pay | Source: Ambulatory Visit | Attending: Vascular Surgery | Admitting: Vascular Surgery

## 2019-05-23 ENCOUNTER — Other Ambulatory Visit: Payer: Self-pay | Admitting: *Deleted

## 2019-05-23 ENCOUNTER — Ambulatory Visit (INDEPENDENT_AMBULATORY_CARE_PROVIDER_SITE_OTHER): Payer: Self-pay | Admitting: Vascular Surgery

## 2019-05-23 ENCOUNTER — Ambulatory Visit (INDEPENDENT_AMBULATORY_CARE_PROVIDER_SITE_OTHER)
Admission: RE | Admit: 2019-05-23 | Discharge: 2019-05-23 | Disposition: A | Discharge status: Home / Self Care | Payer: Self-pay | Source: Ambulatory Visit | Attending: Vascular Surgery | Admitting: Vascular Surgery

## 2019-05-23 ENCOUNTER — Encounter: Payer: Self-pay | Admitting: *Deleted

## 2019-05-23 VITALS — BP 118/58 | HR 66 | Temp 97.2°F | Resp 20 | Ht 60.0 in

## 2019-05-23 DIAGNOSIS — N186 End stage renal disease: Secondary | ICD-10-CM

## 2019-05-23 DIAGNOSIS — Z992 Dependence on renal dialysis: Secondary | ICD-10-CM | POA: Insufficient documentation

## 2019-05-23 NOTE — Progress Notes (Signed)
Patient ID: Sarah Kelley, female   DOB: 1951-04-21, 68 y.o.   MRN: [id number]  Reason for Consult: Follow-up   Referred by [name], MD  Subjective:     HPI:  Sarah Kelley is a 68 y.o. female previously had a left upper arm AV fistula.  This failed several months ago.  She currently on dialysis via right IJ tunnel catheter.  Is never had access in the right upper extremity.  Denies any previous surgeries in that arm.  Past Medical History:  Diagnosis Date  . Diabetes mellitus   . ESRD (end stage renal disease) ([place])   . Hyperlipidemia   . Hypertension   . Neuropathy    Diabetic  . Pneumonia 07/2016  . Renal disorder   . Sepsis ([place])   . Thyroid disease    History reviewed. No pertinent family history. Past Surgical History:  Procedure Laterality Date  . AMPUTATION Left 05/21/2015   Procedure: Left Foot 3rd Ray Amputation;  Surgeon: Newt Minion, MD;  Location: [place];  Service: Orthopedics;  Laterality: Left;  . AV FISTULA PLACEMENT Left 03/28/2016   Procedure: ARTERIOVENOUS (AV) FISTULA CREATION LEFT ARM;  Surgeon: Waynetta Sandy, MD;  Location: [place];  Service: Vascular;  Laterality: Left;  . DILATION AND CURETTAGE OF UTERUS    . FISTULA SUPERFICIALIZATION Left 09/04/2016   Procedure: FISTULA SUPERFICIALIZATION LEFT UPPER ARM;  Surgeon: Waynetta Sandy, MD;  Location: [place];  Service: Vascular;  Laterality: Left;  . IR AV DIALY SHUNT INTRO NEEDLE/INTRAC INITIAL W/PTA/STENT/IMG LT Left 06/13/2017  . IR AV DIALY SHUNT INTRO NEEDLE/INTRAC INITIAL W/PTA/STENT/IMG LT Left 09/12/2017  . IR AV DIALY SHUNT INTRO NEEDLE/INTRACATH INITIAL W/PTA/IMG LEFT  11/10/2016  . IR AV DIALY SHUNT INTRO NEEDLE/INTRACATH INITIAL W/PTA/IMG LEFT  03/09/2017  . IR AV DIALY SHUNT INTRO NEEDLE/INTRACATH INITIAL W/PTA/IMG LEFT  12/12/2017  . IR AV DIALY SHUNT INTRO NEEDLE/INTRACATH INITIAL W/PTA/IMG LEFT  02/27/2018  . IR AV DIALY SHUNT INTRO NEEDLE/INTRACATH  INITIAL W/PTA/IMG LEFT  06/05/2018  . IR AV DIALY SHUNT INTRO NEEDLE/INTRACATH INITIAL W/PTA/IMG LEFT  09/11/2018  . IR DIALY SHUNT INTRO NEEDLE/INTRACATH INITIAL W/IMG LEFT Left 05/08/2018  . IR FLUORO GUIDE CV LINE RIGHT  02/01/2019  . IR GENERIC HISTORICAL  08/01/2016   IR FLUORO GUIDE CV LINE RIGHT 08/01/2016 [name], MD [facility]  . IR GENERIC HISTORICAL  08/01/2016   IR US GUIDE VASC ACCESS RIGHT 08/01/2016 [name], MD [facility]  . IR REMOVAL TUN CV CATH W/O FL  11/01/2016  . IR THROMBECTOMY AV FISTULA W/THROMBOLYSIS INC/SHUNT/IMG LEFT Left 01/31/2019  . IR THROMBECTOMY AV FISTULA W/THROMBOLYSIS/PTA INC/SHUNT/IMG LEFT Left 01/27/2019  . IR US GUIDE VASC ACCESS LEFT  11/10/2016  . IR US GUIDE VASC ACCESS LEFT  09/12/2017  . IR US GUIDE VASC ACCESS LEFT  12/12/2017  . IR US GUIDE VASC ACCESS LEFT  06/05/2018  . IR US GUIDE VASC ACCESS LEFT  09/11/2018  . IR US GUIDE VASC ACCESS LEFT  01/27/2019  . IR US GUIDE VASC ACCESS LEFT  02/01/2019  . IR US GUIDE VASC ACCESS RIGHT  02/01/2019    Short Social History:  Social History   Tobacco Use  . Smoking status: Never Smoker  . Smokeless tobacco: Never Used  Substance Use Topics  . Alcohol use: No    Alcohol/week: 0.0 standard drinks    Comment: rare- a beer every now and then    Allergies  Allergen Reactions  . No Known  Allergies     Current Outpatient Medications  Medication Sig Dispense Refill  . Amino Acids-Protein Hydrolys (FEEDING SUPPLEMENT, PRO-STAT SUGAR FREE 64,) LIQD Take 30 mLs by mouth 2 (two) times daily. 900 mL 0  . atorvastatin (LIPITOR) 40 MG tablet Take 1 tablet (40 mg total) by mouth daily. 30 tablet 6  . Blood Glucose Monitoring Suppl (TRUE METRIX METER) DEVI 1 each by Does not apply route 3 (three) times daily before meals. 1 Device 0  . cetirizine (ZYRTEC) 10 MG tablet Take 1 tablet (10 mg total) by mouth daily. 30 tablet 1  . ciprofloxacin (CIPRO) 250 MG tablet Take 1 tablet (250 mg total) by mouth daily. 6  tablet 0  . DULoxetine (CYMBALTA) 60 MG capsule Take 1 capsule (60 mg total) by mouth daily. 30 capsule 6  . gabapentin (NEURONTIN) 300 MG capsule Take 2 capsules (600 mg total) by mouth at bedtime. 60 capsule 6  . glucose blood (TRUE METRIX BLOOD GLUCOSE TEST) test strip Use 3 times daily before meals 100 each 12  . insulin glargine (LANTUS) 100 UNIT/ML injection Inject subcutaneously twice daily 20 units in the morning and 15 units in the evening 30 mL 5  . lisinopril (ZESTRIL) 20 MG tablet Take 1 tablet (20 mg total) by mouth daily. 30 tablet 6  . polyethylene glycol powder (GLYCOLAX/MIRALAX) 17 GM/SCOOP powder Take 17 g by mouth 2 (two) times daily as needed. 3350 g 1  . SENSIPAR 30 MG tablet Take 30 mg by mouth daily.    Karen Chafe INSULIN SYRINGE 31G X 5/16" 1 ML MISC USE AS DIRECTED 100 each 5  . TRUEplus Lancets 28G MISC USE AS DIRECTED THREE TIMES DAILY TO TEST SUGAR BEFORE MEALS 100 each 12   No current facility-administered medications for this visit.    Facility-Administered Medications Ordered in Other Visits  Medication Dose Route Frequency Provider Last Rate Last Dose  . 0.9 %  sodium chloride infusion   Intravenous Continuous Monia Sabal, PA-C        Review of Systems  Constitutional:  Constitutional negative. HENT: HENT negative.  Eyes: Eyes negative.  Respiratory: Respiratory negative.  Cardiovascular: Cardiovascular negative.  GI: Gastrointestinal negative.  Musculoskeletal: Musculoskeletal negative.  Skin: Skin negative.  Neurological: Neurological negative. Hematologic: Hematologic/lymphatic negative.  Psychiatric: Psychiatric negative.        Objective:  Objective   Vitals:   05/23/19 1009  BP: (!) 118/58  Pulse: 66  Resp: 20  Temp: (!) 97.2 F (36.2 C)  SpO2: 99%  Height: 5' (1.524 m)   Body mass index is 30.27 kg/m.  Physical Exam HENT:     Head: Normocephalic.     Nose: Nose normal.  Eyes:     Pupils: Pupils are equal, round, and  reactive to light.  Neck:     Musculoskeletal: Normal range of motion and neck supple.  Cardiovascular:     Rate and Rhythm: Normal rate.     Comments: 2+ right brachial pulse Pulmonary:     Effort: Pulmonary effort is normal.  Abdominal:     General: Abdomen is flat.     Palpations: Abdomen is soft.  Musculoskeletal: Normal range of motion.  Skin:    General: Skin is warm.     Capillary Refill: Capillary refill takes less than 2 seconds.  Neurological:     General: No focal deficit present.     Mental Status: She is alert.  Psychiatric:        Mood and Affect:  Mood normal.        Behavior: Behavior normal.        Thought Content: Thought content normal.        Judgment: Judgment normal.     Data: I independently interpreted her upper extremity vein mapping on the right which demonstrates possible suitable cephalic vein measuring up to 0.34 cm at the antecubitum.  The basilic vein was not evaluated in the upper arm.  I also independently reviewed the images and interpreted her bilateral upper extremity arterial duplex.  The right upper extremity has biphasic brachial artery measuring 0.3 cm.     Assessment/Plan:     68 year old female with end-stage renal disease on dialysis via catheter.  She has a failed left arm fistula.  She is now indicated for right upper extremity fistula versus graft we will get her scheduled on nondialysis day in the near future.     Waynetta Sandy MD [facility]

## 2019-06-02 ENCOUNTER — Other Ambulatory Visit (HOSPITAL_COMMUNITY)
Admission: RE | Admit: 2019-06-02 | Discharge: 2019-06-02 | Disposition: A | Discharge status: Home / Self Care | Payer: HRSA Program | Source: Ambulatory Visit | Attending: Vascular Surgery | Admitting: Vascular Surgery

## 2019-06-02 ENCOUNTER — Other Ambulatory Visit: Payer: Self-pay

## 2019-06-02 DIAGNOSIS — Z20828 Contact with and (suspected) exposure to other viral communicable diseases: Secondary | ICD-10-CM | POA: Insufficient documentation

## 2019-06-02 DIAGNOSIS — Z20822 Contact with and (suspected) exposure to covid-19: Secondary | ICD-10-CM

## 2019-06-02 DIAGNOSIS — Z01812 Encounter for preprocedural laboratory examination: Secondary | ICD-10-CM | POA: Diagnosis present

## 2019-06-02 LAB — SARS CORONAVIRUS 2 (TAT 6-24 HRS): SARS Coronavirus 2: NEGATIVE

## 2019-06-03 ENCOUNTER — Encounter (HOSPITAL_COMMUNITY): Payer: Self-pay | Admitting: *Deleted

## 2019-06-03 ENCOUNTER — Other Ambulatory Visit: Payer: Self-pay

## 2019-06-03 NOTE — Progress Notes (Signed)
SDW-pre-op call made using Engineer, civil (consulting) # [phone]. Pt requested that son Sarah Kelley complete assessment. Son denies that pt C/O SOB and chest pain. Son denies that pt is under the care of a cardiologist. Son denies that pt ha a stress test and cardiac cath. Son denies that pt had a chest x ray and EKG within the last year. Son made aware to have pt take half dose of Lantus insulin at HS ( 7 units) . Son stated that pt refuses to take any medication and insulin on DOS. Son made aware to have pt check her CBG every 2 hours prior to arrival to hospital on DOS. Son made aware to treat a CBG < 70 with 4 ounces of apple or cranberry juice, wait 15 minutes after intervention to recheck CBG, if CBG remains < 70, call Short Stay unit to speak with a nurse. Son verbalized understanding of all pre-op instructions.

## 2019-06-04 ENCOUNTER — Ambulatory Visit (HOSPITAL_COMMUNITY): Payer: Self-pay | Admitting: Certified Registered Nurse Anesthetist

## 2019-06-04 ENCOUNTER — Ambulatory Visit (HOSPITAL_COMMUNITY)
Admission: RE | Admit: 2019-06-04 | Discharge: 2019-06-04 | Disposition: A | Discharge status: Home / Self Care | Payer: Self-pay | Attending: Vascular Surgery | Admitting: Vascular Surgery

## 2019-06-04 ENCOUNTER — Encounter (HOSPITAL_COMMUNITY)
Admission: RE | Disposition: A | Discharge status: Home / Self Care | Payer: Self-pay | Source: Home / Self Care | Attending: Vascular Surgery

## 2019-06-04 ENCOUNTER — Other Ambulatory Visit: Payer: Self-pay

## 2019-06-04 ENCOUNTER — Encounter (HOSPITAL_COMMUNITY): Payer: Self-pay

## 2019-06-04 DIAGNOSIS — Z794 Long term (current) use of insulin: Secondary | ICD-10-CM | POA: Insufficient documentation

## 2019-06-04 DIAGNOSIS — E785 Hyperlipidemia, unspecified: Secondary | ICD-10-CM | POA: Insufficient documentation

## 2019-06-04 DIAGNOSIS — I120 Hypertensive chronic kidney disease with stage 5 chronic kidney disease or end stage renal disease: Secondary | ICD-10-CM | POA: Insufficient documentation

## 2019-06-04 DIAGNOSIS — E1122 Type 2 diabetes mellitus with diabetic chronic kidney disease: Secondary | ICD-10-CM | POA: Insufficient documentation

## 2019-06-04 DIAGNOSIS — Z992 Dependence on renal dialysis: Secondary | ICD-10-CM | POA: Insufficient documentation

## 2019-06-04 DIAGNOSIS — N185 Chronic kidney disease, stage 5: Secondary | ICD-10-CM

## 2019-06-04 DIAGNOSIS — Z79899 Other long term (current) drug therapy: Secondary | ICD-10-CM | POA: Insufficient documentation

## 2019-06-04 DIAGNOSIS — N186 End stage renal disease: Secondary | ICD-10-CM | POA: Insufficient documentation

## 2019-06-04 DIAGNOSIS — E1140 Type 2 diabetes mellitus with diabetic neuropathy, unspecified: Secondary | ICD-10-CM | POA: Insufficient documentation

## 2019-06-04 HISTORY — PX: AV FISTULA PLACEMENT: SHX1204

## 2019-06-04 HISTORY — DX: Presence of dental prosthetic device (complete) (partial): Z97.2

## 2019-06-04 LAB — POCT I-STAT, CHEM 8
BUN: 28 mg/dL — ABNORMAL HIGH (ref 8–23)
Calcium, Ion: 1.02 mmol/L — ABNORMAL LOW (ref 1.15–1.40)
Chloride: 98 mmol/L (ref 98–111)
Creatinine, Ser: 5.7 mg/dL — ABNORMAL HIGH (ref 0.44–1.00)
Glucose, Bld: 102 mg/dL — ABNORMAL HIGH (ref 70–99)
HCT: 39 % (ref 36.0–46.0)
Hemoglobin: 13.3 g/dL (ref 12.0–15.0)
Potassium: 4.4 mmol/L (ref 3.5–5.1)
Sodium: 133 mmol/L — ABNORMAL LOW (ref 135–145)
TCO2: 31 mmol/L (ref 22–32)

## 2019-06-04 LAB — GLUCOSE, CAPILLARY
Glucose-Capillary: 87 mg/dL (ref 70–99)
Glucose-Capillary: 99 mg/dL (ref 70–99)
Glucose-Capillary: 101 mg/dL — ABNORMAL HIGH (ref 70–99)

## 2019-06-04 SURGERY — ARTERIOVENOUS (AV) FISTULA CREATION
Anesthesia: Monitor Anesthesia Care | Site: Arm Upper | Laterality: Right

## 2019-06-04 MED ORDER — LIDOCAINE-EPINEPHRINE (PF) 1 %-1:200000 IJ SOLN
INTRAMUSCULAR | Status: DC | PRN
  Administered 2019-06-04: 8 mL

## 2019-06-04 MED ORDER — 0.9 % SODIUM CHLORIDE (POUR BTL) OPTIME
TOPICAL | Status: DC | PRN
  Administered 2019-06-04: 1000 mL

## 2019-06-04 MED ORDER — OXYCODONE-ACETAMINOPHEN 5-325 MG PO TABS: 1.0000 | ORAL_TABLET | Freq: Four times a day (QID) | ORAL | 0 refills | Status: DC | PRN

## 2019-06-04 MED ORDER — LIDOCAINE 2% (20 MG/ML) 5 ML SYRINGE
INTRAMUSCULAR | Status: AC
  Filled 2019-06-04: qty 5

## 2019-06-04 MED ORDER — PHENYLEPHRINE HCL-NACL 10-0.9 MG/250ML-% IV SOLN
INTRAVENOUS | Status: DC | PRN
  Administered 2019-06-04: 40 ug/min via INTRAVENOUS

## 2019-06-04 MED ORDER — FENTANYL CITRATE (PF) 250 MCG/5ML IJ SOLN
INTRAMUSCULAR | Status: AC
  Filled 2019-06-04: qty 5

## 2019-06-04 MED ORDER — LIDOCAINE HCL (PF) 1 % IJ SOLN
INTRAMUSCULAR | Status: AC
  Filled 2019-06-04: qty 30

## 2019-06-04 MED ORDER — MIDAZOLAM HCL 2 MG/2ML IJ SOLN
INTRAMUSCULAR | Status: AC
  Filled 2019-06-04: qty 2

## 2019-06-04 MED ORDER — SODIUM CHLORIDE 0.9 % IV SOLN
INTRAVENOUS | Status: AC
  Filled 2019-06-04: qty 1.2

## 2019-06-04 MED ORDER — PHENYLEPHRINE HCL (PRESSORS) 10 MG/ML IV SOLN
INTRAVENOUS | Status: DC | PRN
  Administered 2019-06-04: 80 ug via INTRAVENOUS

## 2019-06-04 MED ORDER — SODIUM CHLORIDE 0.9 % IV SOLN
INTRAVENOUS | Status: DC | PRN
  Administered 2019-06-04: 500 mL

## 2019-06-04 MED ORDER — CEFAZOLIN SODIUM-DEXTROSE 2-4 GM/100ML-% IV SOLN
2.0000 g | INTRAVENOUS | Status: AC
  Administered 2019-06-04: 2 g via INTRAVENOUS
  Filled 2019-06-04: qty 100

## 2019-06-04 MED ORDER — PROPOFOL 500 MG/50ML IV EMUL
INTRAVENOUS | Status: DC | PRN
  Administered 2019-06-04: 75 ug/kg/min via INTRAVENOUS

## 2019-06-04 MED ORDER — ONDANSETRON HCL 4 MG/2ML IJ SOLN
INTRAMUSCULAR | Status: DC | PRN
  Administered 2019-06-04: 4 mg via INTRAVENOUS

## 2019-06-04 MED ORDER — SODIUM CHLORIDE 0.9 % IV SOLN
INTRAVENOUS | Status: DC
  Administered 2019-06-04: 10:00:00 via INTRAVENOUS

## 2019-06-04 MED ORDER — FENTANYL CITRATE (PF) 100 MCG/2ML IJ SOLN
INTRAMUSCULAR | Status: DC | PRN
  Administered 2019-06-04 (×2): 25 ug via INTRAVENOUS

## 2019-06-04 MED ORDER — PROPOFOL 10 MG/ML IV BOLUS
INTRAVENOUS | Status: AC
  Filled 2019-06-04: qty 20

## 2019-06-04 MED ORDER — ONDANSETRON HCL 4 MG/2ML IJ SOLN
INTRAMUSCULAR | Status: AC
  Filled 2019-06-04: qty 2

## 2019-06-04 MED ORDER — LIDOCAINE HCL (CARDIAC) PF 100 MG/5ML IV SOSY
PREFILLED_SYRINGE | INTRAVENOUS | Status: DC | PRN
  Administered 2019-06-04: 80 mg via INTRAVENOUS

## 2019-06-04 MED ORDER — MIDAZOLAM HCL 2 MG/2ML IJ SOLN
INTRAMUSCULAR | Status: DC | PRN
  Administered 2019-06-04: 1 mg via INTRAVENOUS

## 2019-06-04 MED FILL — OXYCODONE-ACETAMINOPHEN 5-3: 5-325 | 2 days supply | Qty: 8 | Fill #0

## 2019-06-04 SURGICAL SUPPLY — 30 items
ADH SKN CLS APL DERMABOND .7 (GAUZE/BANDAGES/DRESSINGS) ×1
ARMBAND PINK RESTRICT EXTREMIT (MISCELLANEOUS) ×3 IMPLANT
CANISTER SUCT 3000ML PPV (MISCELLANEOUS) ×3 IMPLANT
CLIP VESOCCLUDE MED 6/CT (CLIP) ×3 IMPLANT
CLIP VESOCCLUDE SM WIDE 6/CT (CLIP) ×7 IMPLANT
COVER PROBE W GEL 5X96 (DRAPES) ×2 IMPLANT
COVER WAND RF STERILE (DRAPES) ×3 IMPLANT
DERMABOND ADVANCED (GAUZE/BANDAGES/DRESSINGS) ×2
DERMABOND ADVANCED .7 DNX12 (GAUZE/BANDAGES/DRESSINGS) ×1 IMPLANT
ELECT CAUTERY BLADE 6.4 (BLADE) ×2 IMPLANT
ELECT REM PT RETURN 9FT ADLT (ELECTROSURGICAL) ×3
ELECTRODE REM PT RTRN 9FT ADLT (ELECTROSURGICAL) ×1 IMPLANT
GLOVE BIO SURGEON STRL SZ7.5 (GLOVE) ×3 IMPLANT
GOWN STRL REUS W/ TWL LRG LVL3 (GOWN DISPOSABLE) ×2 IMPLANT
GOWN STRL REUS W/ TWL XL LVL3 (GOWN DISPOSABLE) ×1 IMPLANT
GOWN STRL REUS W/TWL LRG LVL3 (GOWN DISPOSABLE) ×6
GOWN STRL REUS W/TWL XL LVL3 (GOWN DISPOSABLE) ×3
INSERT FOGARTY SM (MISCELLANEOUS) ×2 IMPLANT
KIT BASIN OR (CUSTOM PROCEDURE TRAY) ×3 IMPLANT
KIT TURNOVER KIT B (KITS) ×3 IMPLANT
NS IRRIG 1000ML POUR BTL (IV SOLUTION) ×3 IMPLANT
PACK CV ACCESS (CUSTOM PROCEDURE TRAY) ×3 IMPLANT
PAD ARMBOARD 7.5X6 YLW CONV (MISCELLANEOUS) ×6 IMPLANT
SUT MNCRL AB 4-0 PS2 18 (SUTURE) ×3 IMPLANT
SUT PROLENE 6 0 BV (SUTURE) ×5 IMPLANT
SUT VIC AB 3-0 SH 27 (SUTURE) ×3
SUT VIC AB 3-0 SH 27X BRD (SUTURE) ×1 IMPLANT
TOWEL GREEN STERILE (TOWEL DISPOSABLE) ×3 IMPLANT
UNDERPAD 30X30 (UNDERPADS AND DIAPERS) ×3 IMPLANT
WATER STERILE IRR 1000ML POUR (IV SOLUTION) ×3 IMPLANT

## 2019-06-04 NOTE — Anesthesia Preprocedure Evaluation (Signed)
Anesthesia Evaluation  Patient identified by MRN, date of birth, ID band Patient awake    Reviewed: Allergy & Precautions, NPO status , Patient's Chart, lab work & pertinent test results  Airway Mallampati: III  TM Distance: >3 FB Neck ROM: Full    Dental  (+) Teeth Intact, Partial Upper   Pulmonary    breath sounds clear to auscultation       Cardiovascular hypertension,  Rhythm:Irregular Rate:Normal     Neuro/Psych    GI/Hepatic   Endo/Other  diabetes  Renal/GU      Musculoskeletal   Abdominal   Peds  Hematology   Anesthesia Other Findings   Reproductive/Obstetrics                             Anesthesia Physical Anesthesia Plan  ASA: III  Anesthesia Plan: General   Post-op Pain Management:    Induction: Intravenous  PONV Risk Score and Plan:   Airway Management Planned: LMA  Additional Equipment:   Intra-op Plan:   Post-operative Plan:   Informed Consent: I have reviewed the patients History and Physical, chart, labs and discussed the procedure including the risks, benefits and alternatives for the proposed anesthesia with the patient or authorized representative who has indicated his/her understanding and acceptance.     Dental advisory given  Plan Discussed with: Anesthesiologist  Anesthesia Plan Comments:         Anesthesia Quick Evaluation

## 2019-06-04 NOTE — Transfer of Care (Signed)
Immediate Anesthesia Transfer of Care Note  Patient: Sarah Kelley  Procedure(s) Performed: ARTERIOVENOUS (AV) FISTULA CREATION RIGHT ARM (Right Arm Upper)  Patient Location: PACU  Anesthesia Type:MAC  Level of Consciousness: awake and patient cooperative  Airway & Oxygen Therapy: Patient Spontanous Breathing and Patient connected to nasal cannula oxygen  Post-op Assessment: Report given to RN and Post -op Vital signs reviewed and stable  Post vital signs: Reviewed and stable  Last Vitals:  Vitals Value Taken Time  BP 154/140 06/04/19 1247  Temp    Pulse 64 06/04/19 1248  Resp 12 06/04/19 1248  SpO2 97 % 06/04/19 1248  Vitals shown include unvalidated device data.  Last Pain:  Vitals:   06/04/19 0929  TempSrc:   PainSc: 0-No pain      Patients Stated Pain Goal: 3 (74/71/85 5015)  Complications: No apparent anesthesia complications

## 2019-06-04 NOTE — Discharge Instructions (Signed)
° °  [facility]  Discharge Instructions  AV Fistula or Graft Surgery for Dialysis Access  Please refer to the following instructions for your post-procedure care. Your surgeon or physician assistant will discuss any changes with you.  Activity  You may drive the day following your surgery, if you are comfortable and no longer taking prescription pain medication. Resume full activity as the soreness in your incision resolves.  Bathing/Showering  You may shower after you go home. Keep your incision dry for 48 hours. Do not soak in a bathtub, hot tub, or swim until the incision heals completely. You may not shower if you have a hemodialysis catheter.  Incision Care  Clean your incision with mild soap and water after 48 hours. Pat the area dry with a clean towel. You do not need a bandage unless otherwise instructed. Do not apply any ointments or creams to your incision. You may have skin glue on your incision. Do not peel it off. It will come off on its own in about one week. Your arm may swell a bit after surgery. To reduce swelling use pillows to elevate your arm so it is above your heart. Your doctor will tell you if you need to lightly wrap your arm with an ACE bandage.  Diet  Resume your normal diet. There are not special food restrictions following this procedure. In order to heal from your surgery, it is CRITICAL to get adequate nutrition. Your body requires vitamins, minerals, and protein. Vegetables are the best source of vitamins and minerals. Vegetables also provide the perfect balance of protein. Processed food has little nutritional value, so try to avoid this.  Medications  Resume taking all of your medications. If your incision is causing pain, you may take over-the counter pain relievers such as acetaminophen (Tylenol). If you were prescribed a stronger pain medication, please be aware these medications can cause nausea and constipation. Prevent  nausea by taking the medication with a snack or meal. Avoid constipation by drinking plenty of fluids and eating foods with high amount of fiber, such as fruits, vegetables, and grains.  Do not take Tylenol if you are taking prescription pain medications.  Follow up Your surgeon may want to see you in the office following your access surgery. If so, this will be arranged at the time of your surgery.  Please call us immediately for any of the following conditions:  Increased pain, redness, drainage (pus) from your incision site Fever of 101 degrees or higher Severe or worsening pain at your incision site Hand pain or numbness.  Reduce your risk of vascular disease:  Stop smoking. If you would like help, call [facility] at 1-800-QUIT-NOW (321)733-1889) or [facility] at [place] your cholesterol Maintain a desired weight Control your diabetes Keep your blood pressure down  Dialysis  It will take several weeks to several months for your new dialysis access to be ready for use. Your surgeon will determine when it is okay to use it. Your nephrologist will continue to direct your dialysis. You can continue to use your Permcath until your new access is ready for use.   06/04/2019 Sarah Kelley:[id number] 1950-10-20  Surgeon(s): Waynetta Sandy, MD  Procedure(s): ARTERIOVENOUS (AV) FISTULA CREATION RIGHT ARM  x Do not stick fistula for 12 weeks    If you have any questions, please call the office at [phone].

## 2019-06-04 NOTE — H&P (Signed)
   History and Physical Update  The patient was interviewed and re-examined.  The patient's previous History and Physical has been reviewed and is unchanged from recent office visit. Plan for right arm av access today. I have discussed risk and benefits with patient and her interpreter.  I previously discussed this with patient, interpreter and son in the office.  She agrees to proceed and consent signed.  Gailene Youkhana C. Donzetta Matters, MD [facility] [place] Office: [phone] Pager: [phone]   06/04/2019, 10:39 AM

## 2019-06-04 NOTE — Op Note (Signed)
    Patient name: Sarah Kelley MRN: [id number] DOB: [birth year] Sex: female  06/04/2019 Pre-operative Diagnosis: esrd Post-operative diagnosis:  Same Surgeon:  Erlene Quan C. Donzetta Matters, MD Assistant: Ellsworth Lennox, RN Procedure Performed:  Right arm brachiocephalic AV fistula creation  Indications: 68 year old female with history of end-stage renal disease.  She has previous access on the left and is now failed.  She is indicated for right arm fistula versus graft.  Findings: There was what appeared to be adequate cephalic and basilic vein in the antecubitum.  Veins are smaller in the upper arm by duplex.  After transecting the cephalic vein this was dilated to 4 mm flushed with heparinized saline.  At completion there was some pulsatility in the fistula.  She does have good signal at the radial artery and the wrist.  Cephalic vein will likely need to be superficialized if it does mature.  If it fails to mature she can still have a basilic vein fistula above the antecubitum as this remains with inline flow from the wrist.  She will otherwise need to be considered for graft placement in the right upper extremity.   Procedure:  The patient was identified in the holding area and taken to the operating room where MAC anesthesia was induced.  She was sterilely prepped draped the right upper extremity usual fashion antibiotics were minister timeout was called.  Ultrasound was used to identify both the cephalic and basilic vein.  They were somewhat small in the upper arm but quite large at the antecubitum and just in the distal upper arm.  The area was anesthetized with 1% lidocaine just below the antecubital.  Transverse incision was made.  Cephalic and basilic veins were identified and were connected via median cubital vein.  I dissected deeper the fascia identified the brachial artery placed Vesseloops around this.  I elected to use the cephalic vein initially.  I tied this off leaving inline flow to the  basilic vein.  I marked the vein for orientation and transected.  I serially dilated up to 4 mm flushed with heparinized saline.  It was then clamped.  The artery was clamped distally proximally opened longitudinally flushed with heparinized saline by directions.  Fistula was then sewn end-to-side with 6-0 Prolene suture.  Prior completion allowed flushing directions.  Upon completion there was some pulsatility in the fistula.  I was able to trace it with Doppler all the way up the upper arm.  There was a good signal in the radial artery at the wrist that did minimally augment with compression of the fistula itself.  Satisfied with this we did have to repair 1 area on the fistula that looked to be previous IV site.  We then thoroughly irrigated the wound closed in layers of Vicryl and Monocryl.  Dermabond was then placed to level the skin.  She was awake from anesthesia having tolerated procedure without any complication needle counts were correct at completion.  EBL: 25 cc  Jamion Carter C. Donzetta Matters, MD [facility] [place] Office: [phone] Pager: [phone]

## 2019-06-04 NOTE — Anesthesia Postprocedure Evaluation (Signed)
Anesthesia Post Note  Patient: Sarah Kelley  Procedure(s) Performed: ARTERIOVENOUS (AV) FISTULA CREATION RIGHT ARM (Right Arm Upper)     Patient location during evaluation: PACU Anesthesia Type: MAC Level of consciousness: awake and alert Pain management: pain level controlled Vital Signs Assessment: post-procedure vital signs reviewed and stable Respiratory status: spontaneous breathing, nonlabored ventilation, respiratory function stable and patient connected to nasal cannula oxygen Cardiovascular status: blood pressure returned to baseline and stable Postop Assessment: no apparent nausea or vomiting Anesthetic complications: no    Last Vitals:  Vitals:   06/04/19 1446 06/04/19 1458  BP: (!) 111/39   Pulse: 60 (!) 59  Resp: 11 18  Temp:  (!) 36.1 C  SpO2: 100% 100%    Last Pain:  Vitals:   06/04/19 1249  TempSrc:   PainSc: Asleep                 Kaniyah Lisby COKER

## 2019-06-05 ENCOUNTER — Encounter (HOSPITAL_COMMUNITY): Payer: Self-pay | Admitting: Vascular Surgery

## 2019-06-12 ENCOUNTER — Other Ambulatory Visit: Payer: Self-pay | Admitting: Family Medicine

## 2019-06-12 DIAGNOSIS — Z794 Long term (current) use of insulin: Secondary | ICD-10-CM

## 2019-06-12 MED FILL — GABAPENTIN 300 MG CAPSULE: 300 | 30 days supply | Qty: 60 | Fill #1

## 2019-06-12 MED FILL — TRUEPLUS SYR 1ML 31GX5/16: 31G X 5/16" | 30 days supply | Qty: 100 | Fill #0

## 2019-06-12 MED FILL — LISINOPRIL 20 MG TABLET: 20 | 30 days supply | Qty: 30 | Fill #0

## 2019-06-24 ENCOUNTER — Other Ambulatory Visit (HOSPITAL_COMMUNITY): Payer: Self-pay | Admitting: Nephrology

## 2019-06-24 DIAGNOSIS — N186 End stage renal disease: Secondary | ICD-10-CM

## 2019-06-24 DIAGNOSIS — Z992 Dependence on renal dialysis: Secondary | ICD-10-CM

## 2019-06-30 MED FILL — DULoxetine HCL 60 MG CPEP: 60 | 30 days supply | Qty: 30 | Fill #1

## 2019-06-30 MED FILL — ATORVASTATIN CALCIUM 40 MG: 40 | 30 days supply | Qty: 30 | Fill #0

## 2019-07-07 ENCOUNTER — Other Ambulatory Visit: Payer: Self-pay | Admitting: Radiology

## 2019-07-07 ENCOUNTER — Encounter (HOSPITAL_COMMUNITY): Payer: Self-pay

## 2019-07-07 ENCOUNTER — Other Ambulatory Visit: Payer: Self-pay

## 2019-07-07 ENCOUNTER — Ambulatory Visit (HOSPITAL_COMMUNITY)
Admission: RE | Admit: 2019-07-07 | Discharge: 2019-07-07 | Disposition: A | Discharge status: Home / Self Care | Payer: Self-pay | Source: Ambulatory Visit | Attending: Nephrology | Admitting: Nephrology

## 2019-07-07 DIAGNOSIS — Z992 Dependence on renal dialysis: Secondary | ICD-10-CM

## 2019-07-07 DIAGNOSIS — N186 End stage renal disease: Secondary | ICD-10-CM

## 2019-07-09 ENCOUNTER — Other Ambulatory Visit: Payer: Self-pay | Admitting: Radiology

## 2019-07-10 ENCOUNTER — Other Ambulatory Visit: Payer: Self-pay | Admitting: Radiology

## 2019-07-10 ENCOUNTER — Other Ambulatory Visit: Payer: Self-pay | Admitting: Student

## 2019-07-10 ENCOUNTER — Other Ambulatory Visit: Payer: Self-pay | Admitting: Internal Medicine

## 2019-07-10 MED FILL — $LANTUS 100 UNITS/ML VIAL: 100 | 56 days supply | Qty: 20 | Fill #4

## 2019-07-10 MED FILL — TRUEPLUS SYR 1ML 31GX5/16: 31G X 5/16" | 30 days supply | Qty: 100 | Fill #1

## 2019-07-10 MED FILL — TRUEPLUS SYR 1ML 31GX5/16": 31G X 5/16" | 30 days supply | Qty: 100 | Fill #1

## 2019-07-10 MED FILL — GABAPENTIN 300 MG CAPSULE: 300 | 30 days supply | Qty: 60 | Fill #0

## 2019-07-10 MED FILL — LISINOPRIL 20 MG TABLET: 20 | 30 days supply | Qty: 30 | Fill #1

## 2019-07-11 ENCOUNTER — Other Ambulatory Visit: Payer: Self-pay

## 2019-07-11 ENCOUNTER — Encounter (HOSPITAL_COMMUNITY): Payer: Self-pay

## 2019-07-11 ENCOUNTER — Ambulatory Visit (HOSPITAL_COMMUNITY)
Admission: RE | Admit: 2019-07-11 | Discharge: 2019-07-11 | Disposition: A | Discharge status: Home / Self Care | Payer: Self-pay | Source: Ambulatory Visit | Attending: Nephrology | Admitting: Nephrology

## 2019-07-11 DIAGNOSIS — N186 End stage renal disease: Secondary | ICD-10-CM | POA: Insufficient documentation

## 2019-07-11 DIAGNOSIS — Z794 Long term (current) use of insulin: Secondary | ICD-10-CM | POA: Insufficient documentation

## 2019-07-11 DIAGNOSIS — I120 Hypertensive chronic kidney disease with stage 5 chronic kidney disease or end stage renal disease: Secondary | ICD-10-CM | POA: Insufficient documentation

## 2019-07-11 DIAGNOSIS — E1140 Type 2 diabetes mellitus with diabetic neuropathy, unspecified: Secondary | ICD-10-CM | POA: Insufficient documentation

## 2019-07-11 DIAGNOSIS — Z992 Dependence on renal dialysis: Secondary | ICD-10-CM | POA: Insufficient documentation

## 2019-07-11 DIAGNOSIS — Z79899 Other long term (current) drug therapy: Secondary | ICD-10-CM | POA: Insufficient documentation

## 2019-07-11 DIAGNOSIS — E785 Hyperlipidemia, unspecified: Secondary | ICD-10-CM | POA: Insufficient documentation

## 2019-07-11 LAB — CBC
HCT: 36 % (ref 36.0–46.0)
Hemoglobin: 11.5 g/dL — ABNORMAL LOW (ref 12.0–15.0)
MCH: 31.8 pg (ref 26.0–34.0)
MCHC: 31.9 g/dL (ref 30.0–36.0)
MCV: 99.4 fL (ref 80.0–100.0)
Platelets: 204 10*3/uL (ref 150–400)
RBC: 3.62 MIL/uL — ABNORMAL LOW (ref 3.87–5.11)
RDW: 14.2 % (ref 11.5–15.5)
WBC: 5.1 10*3/uL (ref 4.0–10.5)
nRBC: 0 % (ref 0.0–0.2)

## 2019-07-11 LAB — GLUCOSE, CAPILLARY
Glucose-Capillary: 49 mg/dL — ABNORMAL LOW (ref 70–99)
Glucose-Capillary: 207 mg/dL — ABNORMAL HIGH (ref 70–99)

## 2019-07-11 LAB — PROTIME-INR
INR: 1 (ref 0.8–1.2)
Prothrombin Time: 12.8 seconds (ref 11.4–15.2)

## 2019-07-11 MED ORDER — LIDOCAINE HCL 1 % IJ SOLN
INTRAMUSCULAR | Status: AC
  Filled 2019-07-11: qty 20

## 2019-07-11 MED ORDER — DEXTROSE 50 % IV SOLN
INTRAVENOUS | Status: AC
  Administered 2019-07-11: 50 mL via INTRAVENOUS
  Filled 2019-07-11: qty 50

## 2019-07-11 MED ORDER — SODIUM CHLORIDE 0.9 % IV SOLN: INTRAVENOUS | Status: DC

## 2019-07-11 MED ORDER — DEXTROSE 50 % IV SOLN: 1.0000 | Freq: Once | INTRAVENOUS | Status: AC

## 2019-07-11 NOTE — H&P (Signed)
Chief Complaint: Patient was seen in consultation today for facial swelling  Referring Physician(s): Upton,Elizabeth  Supervising Physician: Markus Daft  Patient Status: [facility] - Out-pt  History of Present Illness: Sarah Kelley is a 69 y.o. female with past medical history of DM, HLD, HTN, diabetic neuropathy, and ESRD on HD via R tunneled dialysis catheter who is well-known to Interventional Radiology due to difficulty with left AV fistula access. Her left-sided fistula underwent several declotting procedures and angioplasties but ultimately was unable to be salvaged and used any longer for dialysis.  She underwent R AVF placement with Dr. Donzetta Matters in the [place] 06/04/19 with right-sided tunneled dialysis catheter placement which is currently being used for access. Her R AVF is not yet ready for use.  Over the past few weeks she developed facial swelling.  IR was consulted for venogram for possible SVC syndrome. She presented to radiology last week for procedure, but was unable to undergo venogram without sedation.  She was rescheduled and presents for procedure today with sedation.   Patient assessed at bedside alongside her son with assistance from [place] interpreter.  Mrs. Sarah Kelley provides no history and make no effort to engage in conversation today . Her son provides all history.  He reports that her facial swelling improved and has been completely gone for the past 2 days.  She does occasionally have ankle swelling, but has none today.  She has not been eating and drinking well for the past 1 week.  He feels she is more weak compared to her baseline.  Denies fever, chills, nausea, vomiting, abdominal pain.  She underwent dialysis yesterday.   Past Medical History:  Diagnosis Date  . Diabetes mellitus   . ESRD (end stage renal disease) ([place])   . Hyperlipidemia   . Hypertension   . Neuropathy    Diabetic  . Pneumonia 07/2016  . Renal disorder   . Sepsis ([place])   . Thyroid disease    . Wears dentures     Past Surgical History:  Procedure Laterality Date  . 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 AV FISTULA PLACEMENT Left 03/28/2016   Procedure: ARTERIOVENOUS (AV) FISTULA CREATION LEFT ARM;  Surgeon: Waynetta Sandy, MD;  Location: [place];  Service: Vascular;  Laterality: Left;  . AV FISTULA PLACEMENT Right 06/04/2019   Procedure: ARTERIOVENOUS (AV) FISTULA CREATION RIGHT ARM;  Surgeon: Waynetta Sandy, MD;  Location: [place];  Service: Vascular;  Laterality: Right;  . DILATION AND CURETTAGE OF UTERUS    . EYE SURGERY    . FISTULA SUPERFICIALIZATION Left 09/04/2016   Procedure: FISTULA SUPERFICIALIZATION LEFT UPPER ARM;  Surgeon: Waynetta Sandy, MD;  Location: [place];  Service: Vascular;  Laterality: Left;  . IR AV DIALY SHUNT INTRO NEEDLE/INTRAC INITIAL W/PTA/STENT/IMG LT Left 06/13/2017  . IR AV DIALY SHUNT INTRO NEEDLE/INTRAC INITIAL W/PTA/STENT/IMG LT Left 09/12/2017  . IR AV DIALY SHUNT INTRO NEEDLE/INTRACATH INITIAL W/PTA/IMG LEFT  11/10/2016  . IR AV DIALY SHUNT INTRO NEEDLE/INTRACATH INITIAL W/PTA/IMG LEFT  03/09/2017  . IR AV DIALY SHUNT INTRO NEEDLE/INTRACATH INITIAL W/PTA/IMG LEFT  12/12/2017  . IR AV DIALY SHUNT INTRO NEEDLE/INTRACATH INITIAL W/PTA/IMG LEFT  02/27/2018  . IR AV DIALY SHUNT INTRO NEEDLE/INTRACATH INITIAL W/PTA/IMG LEFT  06/05/2018  . IR AV DIALY SHUNT INTRO NEEDLE/INTRACATH INITIAL W/PTA/IMG LEFT  09/11/2018  . IR DIALY SHUNT INTRO NEEDLE/INTRACATH INITIAL  W/IMG LEFT Left 05/08/2018  . IR FLUORO GUIDE CV LINE RIGHT  02/01/2019  . IR GENERIC HISTORICAL  08/01/2016   IR FLUORO GUIDE CV LINE RIGHT 08/01/2016 [name], MD [facility]-INTERV RAD  . IR GENERIC HISTORICAL  08/01/2016   IR US GUIDE VASC ACCESS RIGHT 08/01/2016 [name], MD [facility]INTER[facility] RAD  . IR REMOVAL TUN CV CATH W/O FL  11/01/2016  . IR THROMBECTOMY AV  FISTULA W/THROMBOLYSIS INC/SHUNT/IMG LEFT Left 01/31/2019  . IR THROMBECTOMY AV FISTULA W/THROMBOLYSIS/PTA INC/SHUNT/IMG LEFT Left 01/27/2019  . IR US GUIDE VASC ACCESS LEFT  11/10/2016  . IR US GUIDE VASC ACCESS LEFT  09/12/2017  . IR US GUIDE VASC ACCESS LEFT  12/12/2017  . IR US GUIDE VASC ACCESS LEFT  06/05/2018  . IR US GUIDE VASC ACCESS LEFT  09/11/2018  . IR US GUIDE VASC ACCESS LEFT  01/27/2019  . IR US GUIDE VASC ACCESS LEFT  02/01/2019  . IR US GUIDE VASC ACCESS RIGHT  02/01/2019    Allergies: Patient has no known allergies.  Medications: Prior to Admission medications   Medication Sig Start Date End Date Taking? Authorizing Provider  acetaminophen (TYLENOL) 325 MG tablet Take 325 mg by mouth every 6 (six) hours as needed for moderate pain or headache.   Yes [provider]  Amino Acids-Protein Hydrolys (FEEDING SUPPLEMENT, PRO-STAT SUGAR FREE 64,) LIQD Take 30 mLs by mouth 2 (two) times daily. 02/17/18  Yes Elgergawy, Silver Huguenin, MD  atorvastatin (LIPITOR) 40 MG tablet Take 1 tablet (40 mg total) by mouth daily. 05/13/19  Yes [name], MD  Blood Glucose Monitoring Suppl (TRUE METRIX METER) DEVI 1 each by Does not apply route 3 (three) times daily before meals. 07/25/17  Yes [name], MD  cetirizine (ZYRTEC) 10 MG tablet Take 1 tablet (10 mg total) by mouth daily. 05/02/19  Yes Ladell Pier, MD  DULoxetine (CYMBALTA) 60 MG capsule Take 1 capsule (60 mg total) by mouth daily. 05/13/19  Yes [name], MD  gabapentin (NEURONTIN) 300 MG capsule Take 2 capsules (600 mg total) by mouth at bedtime. Patient taking differently: Take 600 mg by mouth daily in the afternoon.  05/13/19  Yes [name], MD  glucose blood (TRUE METRIX BLOOD GLUCOSE TEST) test strip Use 3 times daily before meals 07/25/17  Yes Newlin, [name], MD  insulin glargine (LANTUS) 100 UNIT/ML injection Inject subcutaneously twice daily 20 units in the morning and 15 units in the evening Patient  taking differently: Inject 15-18 Units into the skin See admin instructions. Inject 18 units in the morning and 15 units in the evening 05/13/19  Yes Newlin, [name], MD  lisinopril (ZESTRIL) 20 MG tablet Take 1 tablet (20 mg total) by mouth daily. 05/13/19  Yes [name], MD  Nutritional Supplements (FEEDING SUPPLEMENT, NEPRO CARB STEADY,) LIQD Take 237 mLs by mouth every Tuesday, Thursday, and Saturday at 6 PM.   Yes [provider]  oxyCODONE-acetaminophen (PERCOCET) 5-325 MG tablet Take 1 tablet by mouth every 6 (six) hours as needed. 06/04/19  Yes Rhyne, Samantha J, PA-C  polyethylene glycol powder (GLYCOLAX/MIRALAX) 17 GM/SCOOP powder Take 17 g by mouth 2 (two) times daily as needed. Patient taking differently: Take 17 g by mouth 2 (two) times daily as needed for mild constipation.  05/13/19  Yes [name], MD  TRUEPLUS INSULIN SYRINGE 31G X 5/16" 1 ML MISC USE AS DIRECTED 06/12/19  Yes Newlin, [name], MD  TRUEplus Lancets 28G MISC USE AS DIRECTED THREE TIMES DAILY TO  TEST SUGAR BEFORE MEALS 02/11/19  Yes [name], MD     No family history on file.  Social History   Socioeconomic History  . Marital status: Widowed    Spouse name: Not on file  . Number of children: Not on file  . Years of education: Not on file  . Highest education level: Not on file  Occupational History  . Not on file  Tobacco Use  . Smoking status: Never Smoker  . Smokeless tobacco: Never Used  Substance and Sexual Activity  . Alcohol use: Yes    Alcohol/week: 0.0 standard drinks    Comment: rare- a beer every now and then  . Drug use: No  . Sexual activity: Not Currently  Other Topics Concern  . Not on file  Social History Narrative  . Not on file   Social Determinants of Health   Financial Resource Strain:   . Difficulty of Paying Living Expenses: Not on file  Food Insecurity:   . Worried About Charity fundraiser in the Last Year: Not on file  . Ran Out of Food in the  Last Year: Not on file  Transportation Needs:   . Lack of Transportation (Medical): Not on file  . Lack of Transportation (Non-Medical): Not on file  Physical Activity:   . Days of Exercise per Week: Not on file  . Minutes of Exercise per Session: Not on file  Stress:   . Feeling of Stress : Not on file  Social Connections:   . Frequency of Communication with Friends and Family: Not on file  . Frequency of Social Gatherings with Friends and Family: Not on file  . Attends Religious Services: Not on file  . Active Member of Clubs or Organizations: Not on file  . Attends Archivist Meetings: Not on file  . Marital Status: Not on file     Review of Systems: A 12 point ROS discussed and pertinent positives are indicated in the HPI above.  All other systems are negative.  Review of Systems  Constitutional: Negative for fatigue and fever.  Respiratory: Negative for cough and shortness of breath.   Gastrointestinal: Negative for abdominal pain.  Musculoskeletal: Negative for back pain.  Psychiatric/Behavioral: Negative for behavioral problems and confusion.    Vital Signs: BP (!) 143/52   Pulse 70   Temp 97.9 F (36.6 C)   Resp 14   LMP  (LMP Unknown)   SpO2 97%   Physical Exam Vitals and nursing note reviewed.  Constitutional:      Appearance: Normal appearance.  HENT:     Head:     Comments: No facial swelling    Mouth/Throat:     Mouth: Mucous membranes are moist.     Pharynx: Oropharynx is clear.  Cardiovascular:     Rate and Rhythm: Normal rate and regular rhythm.     Pulses: Normal pulses.  Pulmonary:     Effort: Pulmonary effort is normal. No respiratory distress.     Breath sounds: Normal breath sounds.  Abdominal:     General: Abdomen is flat.     Palpations: Abdomen is soft.  Musculoskeletal:        General: No swelling.     Comments: RUE AVF in place with good thrill.  R tunneled IJ catheter in place. No upper or lower extremity swelling.    Skin:    General: Skin is warm and dry.  Neurological:     General: No focal deficit present.  Mental Status: She is alert and oriented to person, place, and time. Mental status is at baseline.  Psychiatric:        Mood and Affect: Mood normal.        Behavior: Behavior normal.        Thought Content: Thought content normal.        Judgment: Judgment normal.          Imaging: No results found.  Labs:  CBC: Recent Labs    09/11/18 0800 01/27/19 1150 01/27/19 1251 01/31/19 1030 06/04/19 0926  WBC 6.6 6.2  --  6.0  --   HGB 11.9* 10.6* 11.2* 10.0* 13.3  HCT 36.9 32.8* 33.0* 31.8* 39.0  PLT PLATELET CLUMPS NOTED ON SMEAR, UNABLE TO ESTIMATE 200  --  247  --     COAGS: Recent Labs    01/27/19 1150  INR 1.2    BMP: Recent Labs    09/11/18 0800 01/27/19 1251 01/31/19 1030 06/04/19 0926  NA 139 134* 136 133*  K 5.4* 5.8* 6.1* 4.4  CL 98 101 97* 98  CO2 30  --  25  --   GLUCOSE 76 95 199* 102*  BUN 31* 136* 86* 28*  CALCIUM 9.4  --  9.0  --   CREATININE 6.34* 15.00* 14.20* 5.70*  GFRNONAA 6*  --  2*  --   GFRAA 7*  --  3*  --     LIVER FUNCTION TESTS: No results for input(s): BILITOT, AST, ALT, ALKPHOS, PROT, ALBUMIN in the last 8760 hours.  TUMOR MARKERS: No results for input(s): AFPTM, CEA, CA199, CHROMGRNA in the last 8760 hours.  Assessment and Plan: Patient with past medical history of ESRD on HD with poor venous access and difficulty with prior LUE AVF presents with complaint of new RUE AVF and recent facial swelling.  IR consulted for venogram at the request of Dr. Hollie Salk. Case reviewed by Dr. Anselm Pancoast who approves patient for procedure.  Patient presents today in their usual state of health.  She has been NPO and is not currently on blood thinners.  Her blood glucose is low today.  Given 1 amp D50.  Labs pending.   Risks and benefits discussed with the patient including, but not limited to bleeding, infection, vascular injury, pulmonary  embolism, need for tunneled HD catheter placement or even death.  All of the patient's questions were answered, patient is agreeable to proceed. Consent signed and in chart.  Thank you for this interesting consult.  I greatly enjoyed meeting [facility] and look forward to participating in their care.  A copy of this report was sent to the requesting provider on this date.  Electronically Signed: Docia Barrier, PA 07/11/2019, 11:14 AM   I spent a total of  30 Minutes   in face to face in clinical consultation, greater than 50% of which was counseling/coordinating care for facial swelling.

## 2019-07-11 NOTE — Telephone Encounter (Signed)
Please fill if patient may continue usage 

## 2019-07-17 ENCOUNTER — Other Ambulatory Visit: Payer: Self-pay

## 2019-07-17 DIAGNOSIS — N186 End stage renal disease: Secondary | ICD-10-CM

## 2019-07-18 ENCOUNTER — Encounter: Payer: Self-pay | Admitting: Vascular Surgery

## 2019-07-18 ENCOUNTER — Ambulatory Visit (HOSPITAL_COMMUNITY): Payer: Self-pay | Attending: Vascular Surgery

## 2019-07-31 MED FILL — ATORVASTATIN CALCIUM 40 MG: 40 | 30 days supply | Qty: 30 | Fill #1

## 2019-07-31 MED FILL — DULoxetine HCL 60 MG CPEP: 60 | 30 days supply | Qty: 30 | Fill #2

## 2019-08-12 IMAGING — XA IR THROMBECTOMY AV FISTULA W/THROMBOLYSIS INC/SHUNT/IMG LEFT
6 series · 13 of 24 positions shown · IV contrast (IODINE)
Comparison: 01/27/2019

EXAM:
EXAM
DIALYSIS GRAFT DECLOT

ULTRASOUND GUIDANCE FOR VASCULAR ACCESS X2

CLINICAL DATA: Occluded dialysis fistula. Previous declot with
venous angioplasty 4 days ago.
TECHNIQUE: The procedure, risks (including but not limited to bleeding,
infection, organ damage ), benefits, and alternatives were explained
to the patient. Questions regarding the procedure were encouraged
and answered. The patient understands and consents to the procedure.

[Series 1: body 4 care · 3 of 7 slices shown (1 of 5)]
[im 1/7]
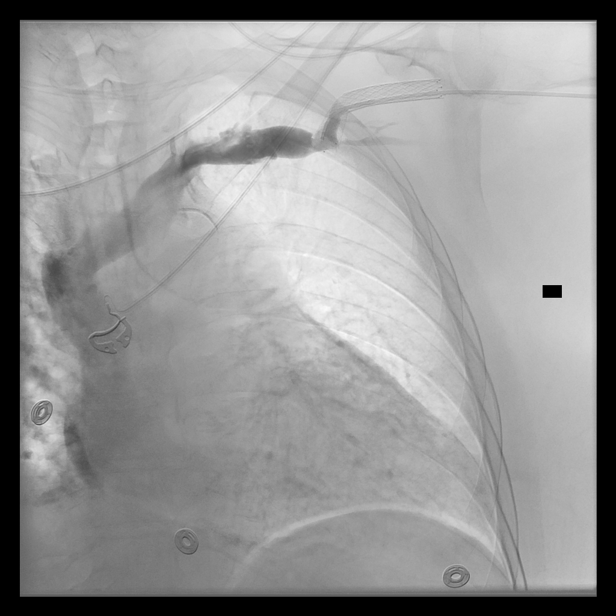
[im 4/7]
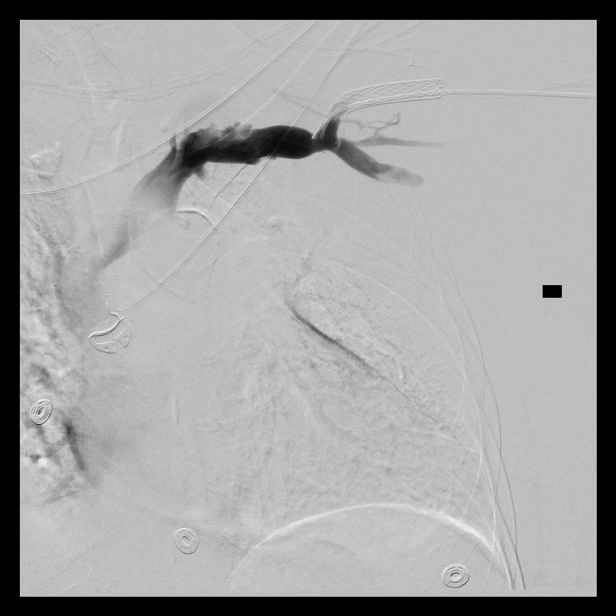
[im 7/7]
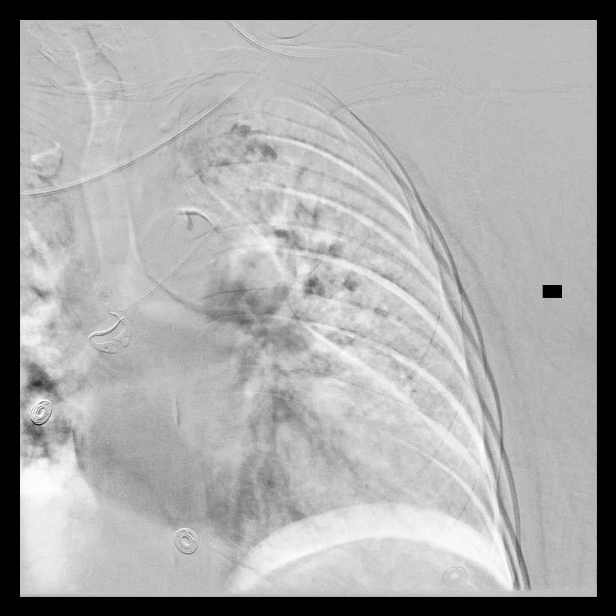

[Series 2: body 4 care · 2 of 8 slices shown (2 of 5)]
[im 2/8]
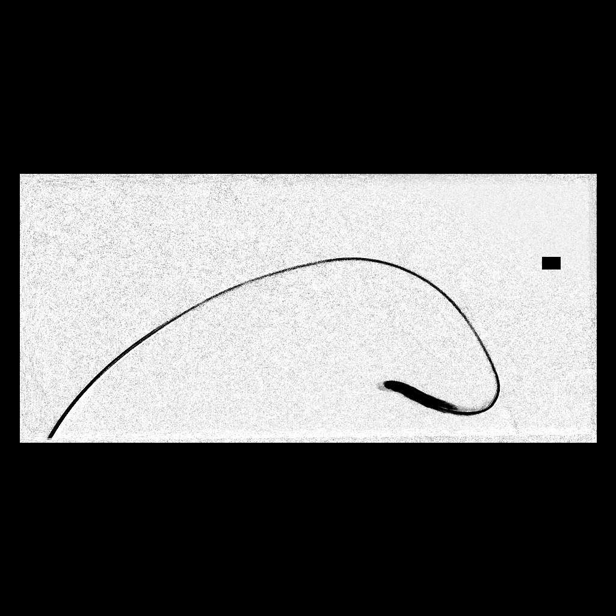
[im 6/8]
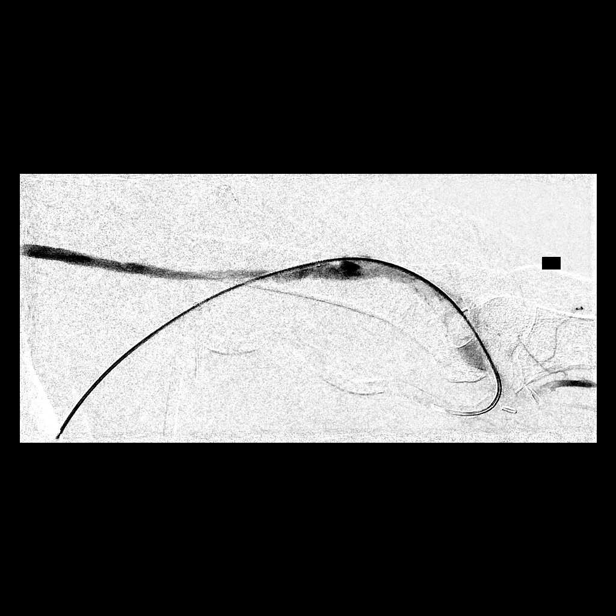

[Series 3: fl (-) angio · 1 of 1 slices shown]
[im 1/1]
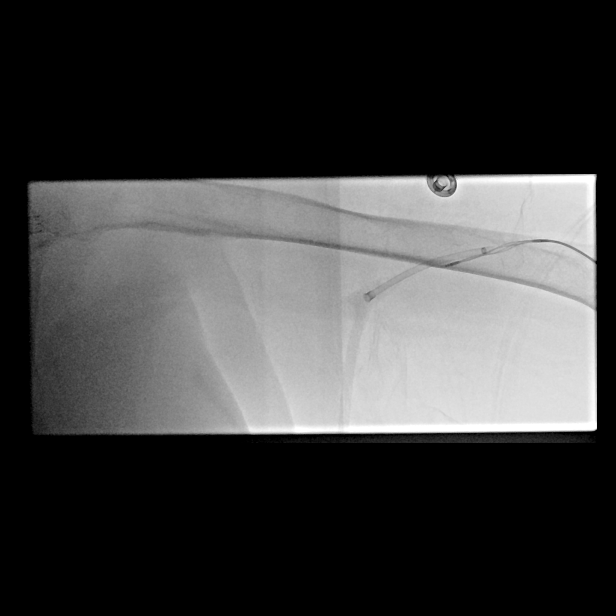

[Series 4: body 4 care · 2 of 7 slices shown (3 of 5)]
[im 3/7]
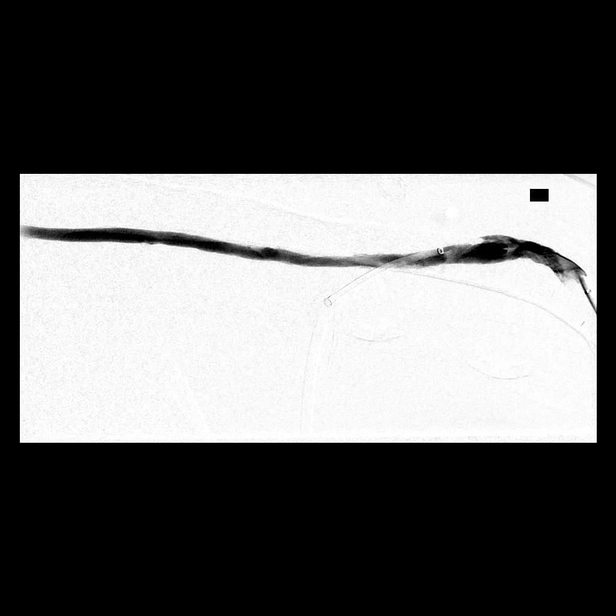
[im 5/7]
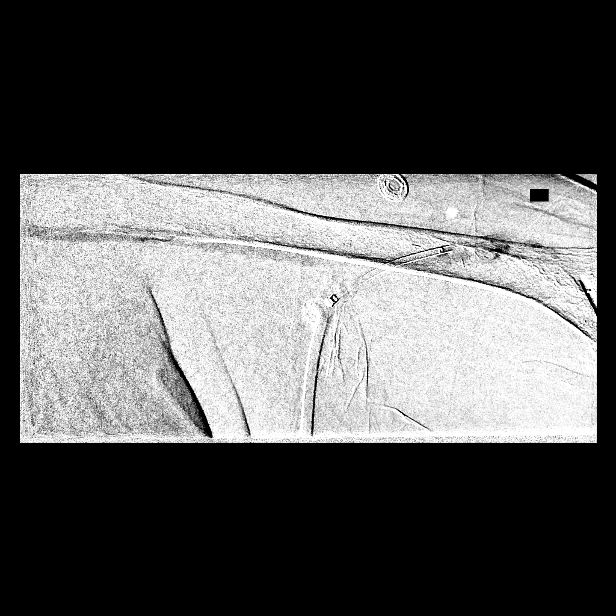

[Series 5: body 4 care · 2 of 7 slices shown (4 of 5)]
[im 1/7]
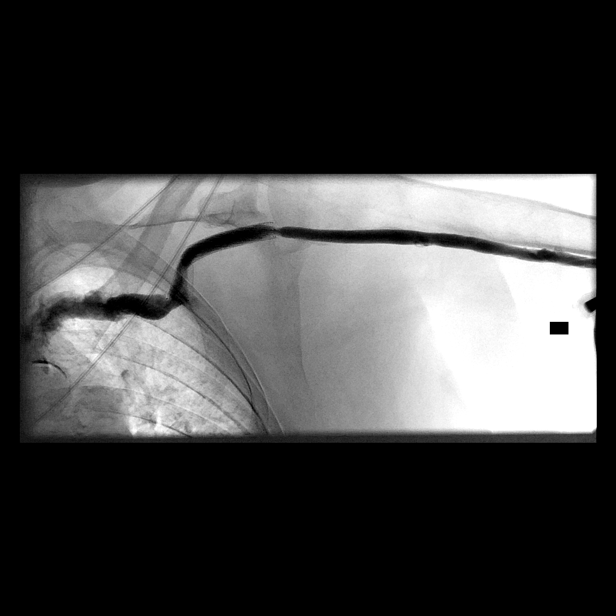
[im 5/7]
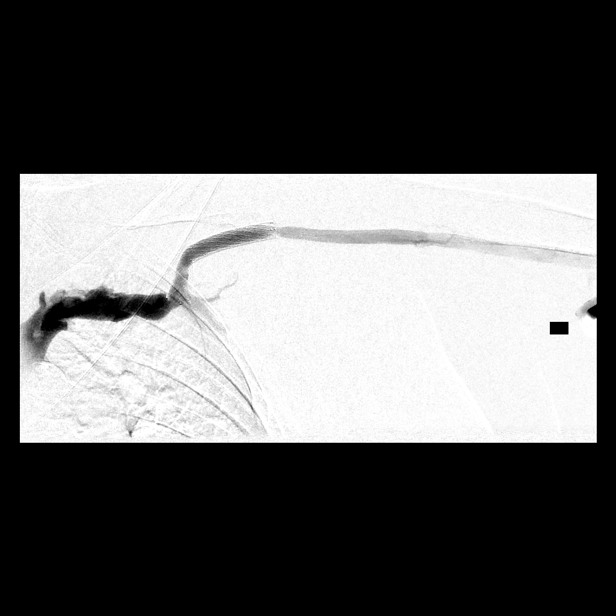

[Series 6: body 4 care · 3 of 8 slices shown (5 of 5)]
[im 1/8]
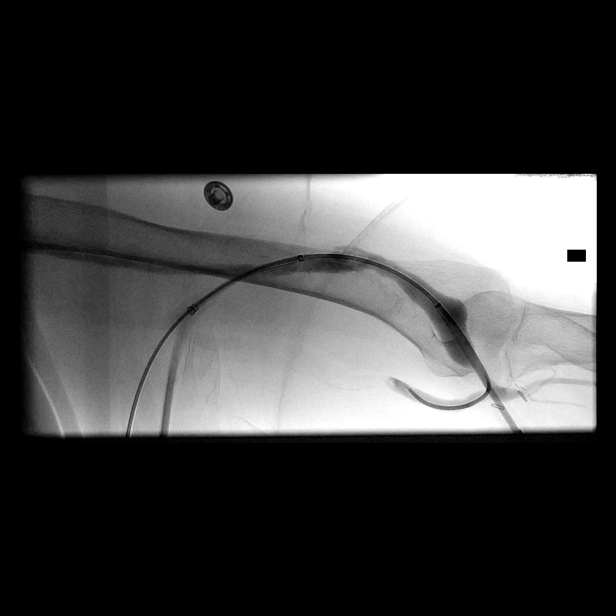
[im 4/8]
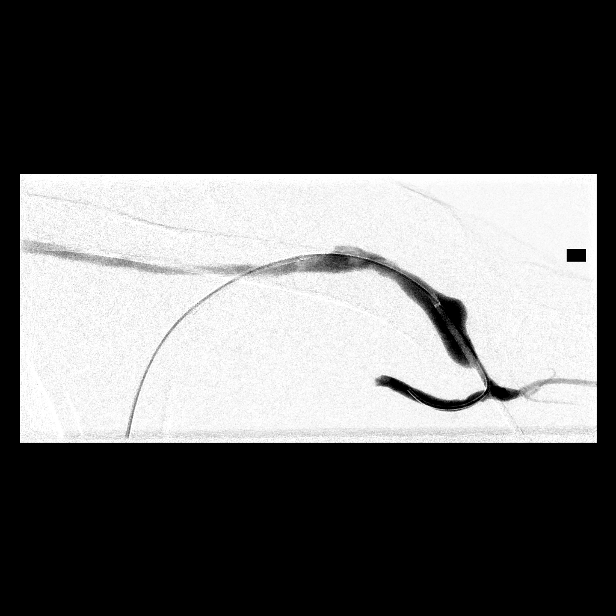
[im 8/8]
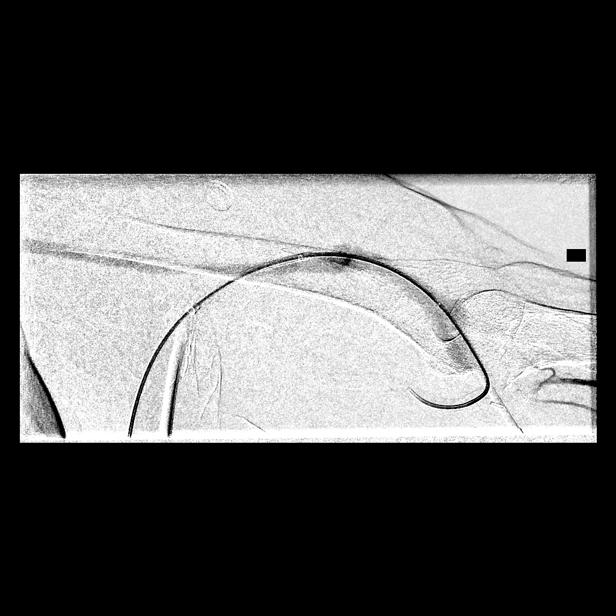

[13 of 24 positions shown; findings below may reference images not displayed]

Intravenous Fentanyl 00mcg were administered as conscious sedation
during continuous monitoring of the patient's level of consciousness
and physiological / cardiorespiratory status by the radiology RN,
with a total moderate sedation time of 35 minutes.

The outflow vein just central to the fistula was accessed antegrade
with a 21-gauge micropuncture needle under real-time ultrasonic
guidance after the overlying skin prepped with Betadine, draped in
usual sterile fashion, infiltrated locally with 1% lidocaine. Needle
exchanged over 018 guidewire for transitional dilator through which
2 mg t-PA was administered. Ultrasound imaging documentation was
saved. Through the dilator, a Bentson wire was advanced centrally.
Over this a 6F sheath was placed, through which a 5 French Kumpe
catheter was advanced for outflow venography. This showed patency of
the central venous system through the SVC. 3333 units heparin were
administered. The Arrow PTD device was used to macerate thrombus in
the outflow vein.

Injection showed clearance of the thrombus from the outflow central
to the sheath.

In similar fashion, the more central aspect of the outflow vein was
accessed retrograde under ultrasound with a micropuncture needle,
exchanged for a transitional dilator. The retrograde dilator was
exchanged in similar fashion for a 6 French vascular sheath. A 5
French Kumpe catheter was advanced into the afferent brachial
artery, exchanged over a Bentson wire for a Fogarty catheter used to
dislodge the platelet plug into the arterial limb of the graft,
where it was macerated with the PTD device. The Fogarty catheter was
used again to remove any residual platelet plug from the arterial
anastomosis. Injection showed clearance of thrombus from the
arterial anastomosis. Additional maceration of residual thrombus in
the aneurysmal segments of the outflow vein was performed.
Ultimately, antegrade flow was restored and injection showed patency
of the anastomosis, without significant residual thrombus,
extravasation or other apparent complication.

A follow shuntogram was performed, demonstrating good flow through
the outflow vein, no extravasation. Injection across the fistula
demonstrate this to be widely patent, with unremarkable appearance
of the visualized native arterial circulation. The catheter and
sheaths were then removed and hemostasis achieved with 2-0 Ethilon
sutures. Patient tolerated procedure well.

FLUOROSCOPY TIME:  6 minutes; 45 [id number] DAP

COMPLICATIONS:
COMPLICATIONS
none
IMPRESSION: 1. Technically successful declot of left upper arm hemodialysis
fistula.

ACCESS:
Remains approachable for percutaneous intervention as needed.

## 2019-08-13 ENCOUNTER — Ambulatory Visit: Payer: Self-pay | Attending: Family Medicine | Admitting: Family Medicine

## 2019-08-13 ENCOUNTER — Encounter: Payer: Self-pay | Admitting: Family Medicine

## 2019-08-13 ENCOUNTER — Other Ambulatory Visit: Payer: Self-pay

## 2019-08-13 VITALS — BP 171/77 | HR 63 | Wt 158.0 lb

## 2019-08-13 DIAGNOSIS — Z794 Long term (current) use of insulin: Secondary | ICD-10-CM

## 2019-08-13 DIAGNOSIS — Z992 Dependence on renal dialysis: Secondary | ICD-10-CM

## 2019-08-13 DIAGNOSIS — N186 End stage renal disease: Secondary | ICD-10-CM

## 2019-08-13 DIAGNOSIS — R413 Other amnesia: Secondary | ICD-10-CM

## 2019-08-13 DIAGNOSIS — E1122 Type 2 diabetes mellitus with diabetic chronic kidney disease: Secondary | ICD-10-CM

## 2019-08-13 DIAGNOSIS — E1149 Type 2 diabetes mellitus with other diabetic neurological complication: Secondary | ICD-10-CM

## 2019-08-13 DIAGNOSIS — I10 Essential (primary) hypertension: Secondary | ICD-10-CM

## 2019-08-13 LAB — POCT GLYCOSYLATED HEMOGLOBIN (HGB A1C): HbA1c, POC (controlled diabetic range): 8.7 % — AB (ref 0.0–7.0)

## 2019-08-13 LAB — GLUCOSE, POCT (MANUAL RESULT ENTRY): POC Glucose: 258 mg/dl — AB (ref 70–99)

## 2019-08-13 MED ORDER — ATORVASTATIN CALCIUM 40 MG PO TABS: 40.0000 mg | ORAL_TABLET | Freq: Every day | ORAL | 6 refills | Status: DC

## 2019-08-13 MED ORDER — GABAPENTIN 300 MG PO CAPS: 300.0000 mg | ORAL_CAPSULE | Freq: Every day | ORAL | 6 refills | Status: DC

## 2019-08-13 MED ORDER — DULOXETINE HCL 60 MG PO CPEP: 60.0000 mg | ORAL_CAPSULE | Freq: Every day | ORAL | 6 refills | Status: DC

## 2019-08-13 MED ORDER — LISINOPRIL 20 MG PO TABS: 20.0000 mg | ORAL_TABLET | Freq: Every day | ORAL | 6 refills | Status: DC

## 2019-08-13 MED FILL — LISINOPRIL 20 MG TABLET: 20 | 30 days supply | Qty: 30 | Fill #0

## 2019-08-13 MED FILL — GABAPENTIN 300 MG CAPSULE: 300 | 30 days supply | Qty: 30 | Fill #0

## 2019-08-13 NOTE — Progress Notes (Signed)
Subjective:  Patient ID: Sarah Kelley, female    DOB: 26-Apr-1951  Age: 69 y.o. 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 She is wheelchair-bound.  With regards to her diabetes mellitus, she has no episodes of hypoglycemia and is compliant with her insulin.Yet to see Ophthalmology for annual eye exam. Daughter states Mom's memory "fades" sometimes; she gets mad and yells sometimes. This is because she is always indoors especially with the pandemic and would like to go out more. Hemodialysis sessions are going well. Her blood pressure is elevated and she endorses compliance with her antihypertensive. Denies presence of chest pain, dyspnea. She has no acute concerns today. Past Medical History:  Diagnosis Date  . Diabetes mellitus   . ESRD (end stage renal disease) ([place])   . Hyperlipidemia   . Hypertension   . Neuropathy    Diabetic  . Pneumonia 07/2016  . Renal disorder   . Sepsis ([place])   . Thyroid disease   . Wears dentures     Past Surgical History:  Procedure Laterality Date  . AMPUTATION Left 05/21/2015   Procedure: Left Foot 3rd Ray Amputation;  Surgeon: Newt Minion, MD;  Location: [facility];  Service: Orthopedics;  Laterality: Left;  . AV FISTULA PLACEMENT Left 03/28/2016   Procedure: ARTERIOVENOUS (AV) FISTULA CREATION LEFT ARM;  Surgeon: Waynetta Sandy, MD;  Location: [facility];  Service: Vascular;  Laterality: Left;  . AV FISTULA PLACEMENT Right 06/04/2019   Procedure: ARTERIOVENOUS (AV) FISTULA CREATION RIGHT ARM;  Surgeon: Waynetta Sandy, MD;  Location: [facility];  Service:  Vascular;  Laterality: Right;  . DILATION AND CURETTAGE OF UTERUS    . EYE SURGERY    . FISTULA SUPERFICIALIZATION Left 09/04/2016   Procedure: FISTULA SUPERFICIALIZATION LEFT UPPER ARM;  Surgeon: Waynetta Sandy, MD;  Location: [facility];  Service: Vascular;  Laterality: Left;  . IR AV DIALY SHUNT INTRO NEEDLE/INTRAC INITIAL W/PTA/STENT/IMG LT Left 06/13/2017  . IR AV DIALY SHUNT INTRO NEEDLE/INTRAC INITIAL W/PTA/STENT/IMG LT Left 09/12/2017  . IR AV DIALY SHUNT INTRO NEEDLE/INTRACATH INITIAL W/PTA/IMG LEFT  11/10/2016  . IR AV DIALY SHUNT INTRO NEEDLE/INTRACATH INITIAL W/PTA/IMG LEFT  03/09/2017  . IR AV DIALY SHUNT INTRO NEEDLE/INTRACATH INITIAL W/PTA/IMG LEFT  12/12/2017  . IR AV DIALY SHUNT INTRO NEEDLE/INTRACATH INITIAL W/PTA/IMG LEFT  02/27/2018  . IR AV DIALY SHUNT INTRO NEEDLE/INTRACATH INITIAL W/PTA/IMG LEFT  06/05/2018  . IR AV DIALY SHUNT INTRO NEEDLE/INTRACATH INITIAL W/PTA/IMG LEFT  09/11/2018  . IR DIALY SHUNT INTRO NEEDLE/INTRACATH INITIAL W/IMG LEFT Left 05/08/2018  . IR FLUORO GUIDE CV LINE RIGHT  02/01/2019  . IR GENERIC HISTORICAL  08/01/2016   IR FLUORO GUIDE CV LINE RIGHT 08/01/2016 [name], MD [facility]  . IR GENERIC HISTORICAL  08/01/2016   IR US GUIDE VASC ACCESS RIGHT 08/01/2016 [name], MD [facility]  . IR REMOVAL TUN CV CATH W/O FL  11/01/2016  . IR THROMBECTOMY AV FISTULA W/THROMBOLYSIS INC/SHUNT/IMG LEFT Left 01/31/2019  . IR THROMBECTOMY AV FISTULA W/THROMBOLYSIS/PTA INC/SHUNT/IMG LEFT Left 01/27/2019  . IR US GUIDE VASC ACCESS LEFT  11/10/2016  . IR US GUIDE VASC ACCESS LEFT  09/12/2017  . IR US  GUIDE VASC ACCESS LEFT  12/12/2017  . IR US GUIDE VASC ACCESS LEFT  06/05/2018  . IR US GUIDE VASC ACCESS LEFT  09/11/2018  . IR US GUIDE VASC ACCESS LEFT  01/27/2019  . IR US GUIDE VASC ACCESS LEFT  02/01/2019  . IR US GUIDE VASC ACCESS RIGHT  02/01/2019    No family history on file.  No Known Allergies  Outpatient Medications Prior to Visit  Medication Sig Dispense  Refill  . acetaminophen (TYLENOL) 325 MG tablet Take 325 mg by mouth every 6 (six) hours as needed for moderate pain or headache.    . Amino Acids-Protein Hydrolys (FEEDING SUPPLEMENT, PRO-STAT SUGAR FREE 64,) LIQD Take 30 mLs by mouth 2 (two) times daily. 900 mL 0  . atorvastatin (LIPITOR) 40 MG tablet Take 1 tablet (40 mg total) by mouth daily. 30 tablet 6  . Blood Glucose Monitoring Suppl (TRUE METRIX METER) DEVI 1 each by Does not apply route 3 (three) times daily before meals. 1 Device 0  . cetirizine (ZYRTEC) 10 MG tablet Take 1 tablet (10 mg total) by mouth daily. 30 tablet 1  . DULoxetine (CYMBALTA) 60 MG capsule Take 1 capsule (60 mg total) by mouth daily. 30 capsule 6  . gabapentin (NEURONTIN) 300 MG capsule Take 2 capsules (600 mg total) by mouth daily in the afternoon. 60 capsule 3  . glucose blood (TRUE METRIX BLOOD GLUCOSE TEST) test strip Use 3 times daily before meals 100 each 12  . insulin glargine (LANTUS) 100 UNIT/ML injection Inject subcutaneously twice daily 20 units in the morning and 15 units in the evening (Patient taking differently: Inject 15-18 Units into the skin See admin instructions. Inject 18 units in the morning and 15 units in the evening) 30 mL 5  . lisinopril (ZESTRIL) 20 MG tablet Take 1 tablet (20 mg total) by mouth daily. 30 tablet 6  . Nutritional Supplements (FEEDING SUPPLEMENT, NEPRO CARB STEADY,) LIQD Take 237 mLs by mouth every Tuesday, Thursday, and Saturday at 6 PM.    . oxyCODONE-acetaminophen (PERCOCET) 5-325 MG tablet Take 1 tablet by mouth every 6 (six) hours as needed. 8 tablet 0  . polyethylene glycol powder (GLYCOLAX/MIRALAX) 17 GM/SCOOP powder Take 17 g by mouth 2 (two) times daily as needed. (Patient taking differently: Take 17 g by mouth 2 (two) times daily as needed for mild constipation. ) 3350 g 1  . TRUEPLUS INSULIN SYRINGE 31G X 5/16" 1 ML MISC USE AS DIRECTED 100 each 5  . TRUEplus Lancets 28G MISC USE AS DIRECTED THREE TIMES DAILY TO TEST  SUGAR BEFORE MEALS 100 each 12   Facility-Administered Medications Prior to Visit  Medication Dose Route Frequency Provider Last Rate Last Admin  . 0.9 %  sodium chloride infusion   Intravenous Continuous Monia Sabal, PA-C         ROS Review of Systems  Constitutional: Negative for activity change, appetite change and fatigue.  HENT: Negative for congestion, sinus pressure and sore throat.   Eyes: Negative for visual disturbance.  Respiratory: Negative for cough, chest tightness, shortness of breath and wheezing.   Cardiovascular: Negative for chest pain and palpitations.  Gastrointestinal: Negative for abdominal distention, abdominal pain and constipation.  Endocrine: Negative for polydipsia.  Genitourinary: Negative for dysuria and frequency.  Musculoskeletal: Positive for gait problem. Negative for arthralgias and back pain.  Skin: Negative for rash.  Neurological: Negative for tremors, light-headedness and numbness.  Hematological: Does not bruise/bleed easily.  Psychiatric/Behavioral: Negative for agitation and behavioral  problems.    Objective:  BP (!) 171/77   Pulse 63   Wt 158 lb (71.7 kg)   LMP  (LMP Unknown)   SpO2 96%   BMI 30.86 kg/m   BP/Weight 08/13/2019 07/11/2019 [id number]  Systolic BP [id number]  Diastolic BP 77 52 39  Wt. (Lbs) 158 - -  BMI 30.86 - -      Physical Exam Constitutional:      Appearance: She is well-developed.  Neck:     Vascular: No JVD.  Cardiovascular:     Rate and Rhythm: Normal rate.     Heart sounds: Normal heart sounds. No murmur.     Comments: Right subclavian permacath Pulmonary:     Effort: Pulmonary effort is normal.     Breath sounds: Normal breath sounds. No wheezing or rales.  Chest:     Chest wall: No tenderness.  Abdominal:     General: Bowel sounds are normal. There is no distension.     Palpations: Abdomen is soft. There is no mass.     Tenderness: There is no abdominal tenderness.  Musculoskeletal:         General: Normal range of motion.     Right lower leg: No edema.     Left lower leg: No edema.  Neurological:     Mental Status: She is alert and oriented to person, place, and time.  Psychiatric:        Mood and Affect: Mood normal.     CMP Latest Ref Rng & Units 06/04/2019 01/31/2019 01/27/2019  Glucose 70 - 99 mg/dL 102(H) 199(H) 95  BUN 8 - 23 mg/dL 28(H) 86(H) 136(H)  Creatinine 0.44 - 1.00 mg/dL 5.70(H) 14.20(H) 15.00(H)  Sodium 135 - 145 mmol/L 133(L) 136 134(L)  Potassium 3.5 - 5.1 mmol/L 4.4 6.1(H) 5.8(H)  Chloride 98 - 111 mmol/L 98 97(L) 101  CO2 22 - 32 mmol/L - 25 -  Calcium 8.9 - 10.3 mg/dL - 9.0 -  Total Protein 6.5 - 8.1 g/dL - - -  Total Bilirubin 0.3 - 1.2 mg/dL - - -  Alkaline Phos 38 - 126 U/L - - -  AST 15 - 41 U/L - - -  ALT 0 - 44 U/L - - -    Lipid Panel     Component Value Date/Time   CHOL 171 09/10/2018 1231   TRIG 223 (H) 09/10/2018 1231   HDL 44 09/10/2018 1231   CHOLHDL 3.9 09/10/2018 1231   CHOLHDL 4.9 01/13/2016 1051   VLDL 66 (H) 01/13/2016 1051   LDLCALC 82 09/10/2018 1231    CBC    Component Value Date/Time   WBC 5.1 07/11/2019 1035   RBC 3.62 (L) 07/11/2019 1035   HGB 11.5 (L) 07/11/2019 1035   HCT 36.0 07/11/2019 1035   PLT 204 07/11/2019 1035   MCV 99.4 07/11/2019 1035   MCH 31.8 07/11/2019 1035   MCHC 31.9 07/11/2019 1035   RDW 14.2 07/11/2019 1035   LYMPHSABS 2.0 01/31/2019 1030   MONOABS 0.5 01/31/2019 1030   EOSABS 0.2 01/31/2019 1030   BASOSABS 0.0 01/31/2019 1030    Lab Results  Component Value Date   HGBA1C 8.7 (A) 08/13/2019    Assessment & Plan:   1. Type 2 diabetes mellitus with chronic kidney disease on chronic dialysis, with long-term current use of insulin (HCC) Uncontrolled with A1c of 8.7; goal is less than 8.0 as she is at a high risk of hypoglycemia due to underlying comorbid conditions  No regimen change today Continue hemodialysis as per schedule Advised she would need to schedule an appointment  with ophthalmology - POCT glucose (manual entry) - POCT glycosylated hemoglobin (Hb A1C) - atorvastatin (LIPITOR) 40 MG tablet; Take 1 tablet (40 mg total) by mouth daily.  Dispense: 30 tablet; Refill: 6  2. Other diabetic neurological complication associated with type 2 diabetes mellitus (HCC) Stable Decreased gabapentin dose due to hemodialysis status - DULoxetine (CYMBALTA) 60 MG capsule; Take 1 capsule (60 mg total) by mouth daily.  Dispense: 30 capsule; Refill: 6 - gabapentin (NEURONTIN) 300 MG capsule; Take 1 capsule (300 mg total) by mouth at bedtime.  Dispense: 30 capsule; Refill: 6  3. Essential hypertension Uncontrolled Slightly permissive hypertension due to her risk of hypotension with hemodialysis Continue low-sodium, DASH diet - lisinopril (ZESTRIL) 20 MG tablet; Take 1 tablet (20 mg total) by mouth daily.  Dispense: 30 tablet; Refill: 6  4. Episodic memory loss Could be secondary to dementia Consider initiation of Namenda at next visit  Return in about 6 months (around 02/10/2020) for Chronic medical conditions.      [name], MD, FAAFP. [facility] and [facility], [place]   08/13/2019, 3:20 PM

## 2019-08-15 MED FILL — GABAPENTIN 300 MG CAPSULE: 300 | 30 days supply | Qty: 60 | Fill #1

## 2019-08-15 MED FILL — LISINOPRIL 20 MG TABLET: 20 | 30 days supply | Qty: 30 | Fill #2

## 2019-08-18 ENCOUNTER — Ambulatory Visit: Payer: Self-pay | Admitting: Family Medicine

## 2019-08-22 ENCOUNTER — Ambulatory Visit (INDEPENDENT_AMBULATORY_CARE_PROVIDER_SITE_OTHER): Payer: Self-pay | Admitting: Vascular Surgery

## 2019-08-22 ENCOUNTER — Ambulatory Visit (HOSPITAL_COMMUNITY)
Admission: RE | Admit: 2019-08-22 | Discharge: 2019-08-22 | Disposition: A | Discharge status: Home / Self Care | Payer: Self-pay | Source: Ambulatory Visit | Attending: Vascular Surgery | Admitting: Vascular Surgery

## 2019-08-22 ENCOUNTER — Other Ambulatory Visit: Payer: Self-pay

## 2019-08-22 ENCOUNTER — Encounter: Payer: Self-pay | Admitting: Vascular Surgery

## 2019-08-22 ENCOUNTER — Ambulatory Visit (HOSPITAL_COMMUNITY): Admission: RE | Admit: 2019-08-22 | Payer: MEDICAID | Source: Ambulatory Visit

## 2019-08-22 VITALS — BP 158/78 | HR 68 | Temp 97.8°F | Resp 20 | Ht 60.0 in | Wt 158.0 lb

## 2019-08-22 DIAGNOSIS — N186 End stage renal disease: Secondary | ICD-10-CM | POA: Insufficient documentation

## 2019-08-22 NOTE — Progress Notes (Signed)
    Subjective:     Patient ID: Sarah Kelley, female   DOB: January 27, 1951, 69 y.o.   MRN: [id number]  HPI 69 year old female follows up after right arm AV fistula.  She does not have any new hand or arm issues.  Currently doing well on dialysis Tuesdays, Thursdays, Saturdays via tunneled dialysis catheter.  History obtained from interpreter.  Review of Systems No acute issues    Objective:   Physical Exam  Vitals:   08/22/19 1251  BP: (!) 158/78  Pulse: 68  Resp: 20  Temp: 97.8 F (36.6 C)  SpO2: 97%   Awake alert oriented Nonlabored aspirations Palpable fistula in right upper extremity Nonpalpable radial or ulnar pulse Grip strength 5 out of 5 on the right    I have independently interpreted her dialysis duplex which demonstrates flow volume 777 mL/min.  Diameter is up to 0.52 cm.  Depth is up to 2.85 cm at the shoulder in the distal upper arm 0.8 cm.     Assessment:     69 year old female with end-stage renal disease status post right cephalic vein fistula creation that is maturing nicely but is too deep for use.    Plan:     We will plan surgical transposition versus superficial station on a nondialysis day in the near future.    Geneal Huebert C. Donzetta Matters, MD [facility] Office: [phone] Pager: [phone]

## 2019-08-27 MED FILL — DULoxetine HCL 60 MG CPEP: 60 | 30 days supply | Qty: 30 | Fill #3

## 2019-08-27 MED FILL — ATORVASTATIN CALCIUM 40 MG: 40 | 30 days supply | Qty: 30 | Fill #2

## 2019-09-06 ENCOUNTER — Emergency Department (HOSPITAL_COMMUNITY)
Admission: EM | Admit: 2019-09-06 | Discharge: 2019-09-06 | Disposition: A | Discharge status: Home / Self Care | Payer: Self-pay | Attending: Emergency Medicine | Admitting: Emergency Medicine

## 2019-09-06 ENCOUNTER — Emergency Department (HOSPITAL_COMMUNITY): Payer: Self-pay

## 2019-09-06 ENCOUNTER — Encounter (HOSPITAL_COMMUNITY): Payer: Self-pay | Admitting: Pharmacy Technician

## 2019-09-06 ENCOUNTER — Other Ambulatory Visit: Payer: Self-pay

## 2019-09-06 DIAGNOSIS — N186 End stage renal disease: Secondary | ICD-10-CM | POA: Insufficient documentation

## 2019-09-06 DIAGNOSIS — Z79899 Other long term (current) drug therapy: Secondary | ICD-10-CM | POA: Insufficient documentation

## 2019-09-06 DIAGNOSIS — E1165 Type 2 diabetes mellitus with hyperglycemia: Secondary | ICD-10-CM | POA: Insufficient documentation

## 2019-09-06 DIAGNOSIS — Z794 Long term (current) use of insulin: Secondary | ICD-10-CM | POA: Insufficient documentation

## 2019-09-06 DIAGNOSIS — I120 Hypertensive chronic kidney disease with stage 5 chronic kidney disease or end stage renal disease: Secondary | ICD-10-CM | POA: Insufficient documentation

## 2019-09-06 DIAGNOSIS — E1122 Type 2 diabetes mellitus with diabetic chronic kidney disease: Secondary | ICD-10-CM | POA: Insufficient documentation

## 2019-09-06 DIAGNOSIS — Z992 Dependence on renal dialysis: Secondary | ICD-10-CM | POA: Insufficient documentation

## 2019-09-06 DIAGNOSIS — R739 Hyperglycemia, unspecified: Secondary | ICD-10-CM

## 2019-09-06 DIAGNOSIS — E1140 Type 2 diabetes mellitus with diabetic neuropathy, unspecified: Secondary | ICD-10-CM | POA: Insufficient documentation

## 2019-09-06 LAB — I-STAT CHEM 8, ED
BUN: 24 mg/dL — ABNORMAL HIGH (ref 8–23)
Calcium, Ion: 1.08 mmol/L — ABNORMAL LOW (ref 1.15–1.40)
Chloride: 93 mmol/L — ABNORMAL LOW (ref 98–111)
Creatinine, Ser: 4.1 mg/dL — ABNORMAL HIGH (ref 0.44–1.00)
Glucose, Bld: 488 mg/dL — ABNORMAL HIGH (ref 70–99)
HCT: 39 % (ref 36.0–46.0)
Hemoglobin: 13.3 g/dL (ref 12.0–15.0)
Potassium: 4.4 mmol/L (ref 3.5–5.1)
Sodium: 133 mmol/L — ABNORMAL LOW (ref 135–145)
TCO2: 32 mmol/L (ref 22–32)

## 2019-09-06 NOTE — ED Notes (Signed)
Titrated pt 02 from 2L to 1L per doctor's request

## 2019-09-06 NOTE — ED Notes (Addendum)
Pt titrated off 02 and is currently on RA per doctor's request

## 2019-09-06 NOTE — ED Triage Notes (Signed)
Pt bib family from dialysis. Per daughter, dialysis center reports crackles in lung bases with oxygen saturation in the 60's. Pt did complete her full treatment today. During triage oxygen saturation 98% and pt with no complaints.

## 2019-09-06 NOTE — ED Provider Notes (Signed)
[facility] EMERGENCY DEPARTMENT Provider Note   CSN: [id number] Arrival date & time: 09/06/19  1531     History Chief Complaint  Patient presents with  . Shortness of Breath    Sarah Kelley is a 69 y.o. female.  HPI   She is reportedly sent here after dialysis today, when her oxygen saturations were low, by report of family member.  No recent illnesses.  She did not take her insulin today.  No known sick contacts.  There are no other known modifying factors.     Past Medical History:  Diagnosis Date  . Diabetes mellitus   . ESRD (end stage renal disease) ([place])   . Hyperlipidemia   . Hypertension   . Neuropathy    Diabetic  . Pneumonia 07/2016  . Renal disorder   . Sepsis ([place])   . Thyroid disease   . Wears dentures     Patient Active Problem List   Diagnosis Date Noted  . AVF (arteriovenous fistula) ([place])   . MSSA (methicillin susceptible Staphylococcus aureus) infection   . Nausea & vomiting 02/11/2018  . Insomnia 01/18/2017  . Altered mental status   . Goals of care, counseling/discussion   . Oropharyngeal dysphagia 12/04/2016  . UTI (urinary tract infection) 12/04/2016  . Severe sepsis ([place]) 12/02/2016  . Hyperglycemia 12/02/2016  . Acute encephalopathy 07/28/2016  . CAP (community acquired pneumonia) 07/28/2016  . Pneumonia 07/28/2016  . ESRD (end stage renal disease) ([place])   . Phantom pain ([place]) 09/13/2015  . Hyperlipidemia 06/01/2015  . Toe amputation status   . Insulin-requiring or dependent type II diabetes mellitus ([place])   . Chronic kidney disease (CKD) stage G4/A1, severely decreased glomerular filtration rate (GFR) between 15-29 mL/min/1.73 square meter and albuminuria creatinine ratio less than 30 mg/g (HCC) 05/18/2015  . Diabetic infection of left foot ([place]) 05/18/2015  . Osteomyelitis ([place]) 05/18/2015  . Abdominal pain 05/18/2015  . Osteomyelitis of left foot ([place]) 05/18/2015  . Diabetic neuropathy ([place])  01/14/2007  . Diabetes mellitus without complication ([place]) 44/08/4740  . HLD (hyperlipidemia) 01/14/2007  . Normocytic anemia 01/14/2007  . ANXIETY 01/14/2007  . Depression 01/14/2007  . Essential hypertension 01/14/2007    Past Surgical History:  Procedure Laterality Date  . AMPUTATION Left 05/21/2015   Procedure: Left Foot 3rd Ray Amputation;  Surgeon: Newt Minion, MD;  Location: [place];  Service: Orthopedics;  Laterality: Left;  . AV FISTULA PLACEMENT Left 03/28/2016   Procedure: ARTERIOVENOUS (AV) FISTULA CREATION LEFT ARM;  Surgeon: Waynetta Sandy, MD;  Location: [place];  Service: Vascular;  Laterality: Left;  . AV FISTULA PLACEMENT Right 06/04/2019   Procedure: ARTERIOVENOUS (AV) FISTULA CREATION RIGHT ARM;  Surgeon: Waynetta Sandy, MD;  Location: [place];  Service: Vascular;  Laterality: Right;  . DILATION AND CURETTAGE OF UTERUS    . EYE SURGERY    . FISTULA SUPERFICIALIZATION Left 09/04/2016   Procedure: FISTULA SUPERFICIALIZATION LEFT UPPER ARM;  Surgeon: Waynetta Sandy, MD;  Location: [place];  Service: Vascular;  Laterality: Left;  . IR AV DIALY SHUNT INTRO NEEDLE/INTRAC INITIAL W/PTA/STENT/IMG LT Left 06/13/2017  . IR AV DIALY SHUNT INTRO NEEDLE/INTRAC INITIAL W/PTA/STENT/IMG LT Left 09/12/2017  . IR AV DIALY SHUNT INTRO NEEDLE/INTRACATH INITIAL W/PTA/IMG LEFT  11/10/2016  . IR AV DIALY SHUNT INTRO NEEDLE/INTRACATH INITIAL W/PTA/IMG LEFT  03/09/2017  . IR AV DIALY SHUNT INTRO NEEDLE/INTRACATH INITIAL W/PTA/IMG LEFT  12/12/2017  . IR AV DIALY SHUNT INTRO NEEDLE/INTRACATH INITIAL W/PTA/IMG LEFT  02/27/2018  . IR AV DIALY SHUNT INTRO NEEDLE/INTRACATH INITIAL W/PTA/IMG LEFT  06/05/2018  . IR AV DIALY SHUNT INTRO NEEDLE/INTRACATH INITIAL W/PTA/IMG LEFT  09/11/2018  . IR DIALY SHUNT INTRO NEEDLE/INTRACATH INITIAL W/IMG LEFT Left 05/08/2018  . IR FLUORO GUIDE CV LINE RIGHT  02/01/2019  . IR GENERIC HISTORICAL  08/01/2016   IR FLUORO GUIDE CV LINE RIGHT 08/01/2016 [name], MD [facility]  . IR GENERIC HISTORICAL  08/01/2016   IR US GUIDE VASC ACCESS RIGHT 08/01/2016 [name], MD [facility]  . IR REMOVAL TUN CV CATH W/O FL  11/01/2016  . IR THROMBECTOMY AV FISTULA W/THROMBOLYSIS INC/SHUNT/IMG LEFT Left 01/31/2019  . IR THROMBECTOMY AV FISTULA W/THROMBOLYSIS/PTA INC/SHUNT/IMG LEFT Left 01/27/2019  . IR US GUIDE VASC ACCESS LEFT  11/10/2016  . IR US GUIDE VASC ACCESS LEFT  09/12/2017  . IR US GUIDE VASC ACCESS LEFT  12/12/2017  . IR US GUIDE VASC ACCESS LEFT  06/05/2018  . IR US GUIDE VASC ACCESS LEFT  09/11/2018  . IR US GUIDE VASC ACCESS LEFT  01/27/2019  . IR US GUIDE VASC ACCESS LEFT  02/01/2019  . IR US GUIDE VASC ACCESS RIGHT  02/01/2019     OB History   No obstetric history on file.     No family history on file.  Social History   Tobacco Use  . Smoking status: Never Smoker  . Smokeless tobacco: Never Used  Substance Use Topics  . Alcohol use: Yes    Alcohol/week: 0.0 standard drinks    Comment: rare- a beer every now and then  . Drug use: No    Home Medications Prior to Admission medications   Medication Sig Start Date End Date Taking? Authorizing Provider  Amino Acids-Protein Hydrolys (FEEDING SUPPLEMENT, PRO-STAT SUGAR FREE 64,) LIQD Take 30 mLs by mouth 2 (two) times daily. Patient not taking: Reported on 09/05/2019 02/17/18   Elgergawy, Silver Huguenin, MD  atorvastatin (LIPITOR) 40 MG tablet Take 1 tablet (40 mg total) by mouth daily. 08/13/19   [name], MD  Blood Glucose Monitoring Suppl (TRUE METRIX METER) DEVI 1 each by Does not apply route 3 (three) times daily before meals. 07/25/17   [name], MD  cetirizine (ZYRTEC) 10 MG tablet Take 1 tablet (10 mg total) by mouth daily. Patient not taking: Reported on 09/05/2019 05/02/19   Ladell Pier, MD  DULoxetine (CYMBALTA) 60 MG capsule Take 1 capsule (60 mg total) by mouth daily. Patient taking differently: Take 60 mg by mouth every evening.  08/13/19   [name], MD    gabapentin (NEURONTIN) 300 MG capsule Take 1 capsule (300 mg total) by mouth at bedtime. 08/13/19   [name], MD  glucose blood (TRUE METRIX BLOOD GLUCOSE TEST) test strip Use 3 times daily before meals 07/25/17   [name], MD  insulin glargine (LANTUS) 100 UNIT/ML injection Inject subcutaneously twice daily 20 units in the morning and 15 units in the evening Patient taking differently: Inject 14-18 Units into the skin See admin instructions. Inject 18 units in the morning and 14 units in the evening 05/13/19   [name], MD  lisinopril (ZESTRIL) 20 MG tablet Take 1 tablet (20 mg total) by mouth daily. 08/13/19   [name], MD  Nutritional Supplements (FEEDING SUPPLEMENT, NEPRO CARB STEADY,) LIQD Take 237 mLs by mouth See admin instructions. Mon. Thurs Sat    [provider]  oxyCODONE-acetaminophen (PERCOCET) 5-325 MG tablet Take 1 tablet by mouth every 6 (six) hours  as needed. Patient not taking: Reported on 09/05/2019 06/04/19   Gabriel Earing, PA-C  polyethylene glycol powder (GLYCOLAX/MIRALAX) 17 GM/SCOOP powder Take 17 g by mouth 2 (two) times daily as needed. Patient not taking: Reported on 09/05/2019 05/13/19   [name], MD  TRUEPLUS INSULIN SYRINGE 31G X 5/16" 1 ML MISC USE AS DIRECTED 06/12/19   [name], MD  TRUEplus Lancets 28G MISC USE AS DIRECTED THREE TIMES DAILY TO TEST SUGAR BEFORE MEALS 02/11/19   [name], MD    Allergies    Patient has no known allergies.  Review of Systems   Review of Systems  All other systems reviewed and are negative.   Physical Exam Updated Vital Signs BP (!) 129/54 (BP Location: Left Arm)   Pulse 75   Resp 18   LMP  (LMP Unknown)   SpO2 100%   Physical Exam Vitals and nursing note reviewed.  Constitutional:      General: She is not in acute distress.    Appearance: She is well-developed. She is obese. She is not ill-appearing, toxic-appearing or diaphoretic.  HENT:     Head:  Normocephalic and atraumatic.     Right Ear: External ear normal.     Left Ear: External ear normal.  Eyes:     Conjunctiva/sclera: Conjunctivae normal.     Pupils: Pupils are equal, round, and reactive to light.  Neck:     Trachea: Phonation normal.  Cardiovascular:     Rate and Rhythm: Normal rate and regular rhythm.     Heart sounds: Normal heart sounds. No murmur.  Pulmonary:     Effort: Pulmonary effort is normal.     Breath sounds: Normal breath sounds.  Abdominal:     General: There is no distension.     Palpations: Abdomen is soft.     Tenderness: There is no abdominal tenderness.  Musculoskeletal:        General: Normal range of motion.     Cervical back: Normal range of motion and neck supple.  Skin:    General: Skin is warm and dry.  Neurological:     Mental Status: She is alert.     Cranial Nerves: No cranial nerve deficit.     Motor: No abnormal muscle tone.     Coordination: Coordination normal.  Psychiatric:        Mood and Affect: Mood normal.        Behavior: Behavior normal.     ED Results / Procedures / Treatments   Labs (all labs ordered are listed, but only abnormal results are displayed) Labs Reviewed  I-STAT CHEM 8, ED - Abnormal; Notable for the following components:      Result Value   Sodium 133 (*)    Chloride 93 (*)    BUN 24 (*)    Creatinine, Ser 4.10 (*)    Glucose, Bld 488 (*)    Calcium, Ion 1.08 (*)    All other components within normal limits    EKG EKG Interpretation  Date/Time:  Saturday September 06 2019 15:52:24 EST Ventricular Rate:  84 PR Interval:  138 QRS Duration: 116 QT Interval:  428 QTC Calculation: 505 R Axis:   -61 Text Interpretation: Sinus rhythm with Premature atrial complexes Left anterior fascicular block Left ventricular hypertrophy with QRS widening ( R in aVL , Cornell product ) Nonspecific ST and T wave abnormality Prolonged QT Abnormal ECG since last tracing no significant change Confirmed by Daleen Bo [phone]) on  09/06/2019 7:09:51 PM   Radiology DG Chest 2 View  Result Date: 09/06/2019 CLINICAL DATA:  69 year old with end-stage renal disease who had hypoxia during dialysis earlier today with oxygen saturations in the 60s, though the patient's current oxygen saturation is 98%. EXAM: CHEST - 2 VIEW COMPARISON:  02/10/2018 and earlier. FINDINGS: Suboptimal inspiration. Cardiac silhouette normal in size, unchanged. Thoracic aorta mildly atherosclerotic, unchanged. Hilar and mediastinal contours otherwise unremarkable. RIGHT jugular dialysis catheter tip at the cavoatrial junction. Stable scarring involving the lingula and LEFT LOWER LOBE. Lungs otherwise clear. Pulmonary vascularity normal without evidence of pulmonary edema. No pleural effusions. Visualized bony thorax intact. IMPRESSION: No acute cardiopulmonary disease. Stable scarring involving the lingula and LEFT LOWER LOBE. Electronically Signed   By: Evangeline Dakin M.D.   On: 09/06/2019 16:32    Procedures Procedures (including critical care time)  Medications Ordered in ED Medications - No data to display  ED Course  I have reviewed the triage vital signs and the nursing notes.  Pertinent labs & imaging results that were available during my care of the patient were reviewed by me and considered in my medical decision making (see chart for details).  Clinical Course as of Sep 05 2128  Sat Sep 06, 2019  2123 Normal except sodium low, chloride low, BUN high, creatinine high, glucose high, calcium low  I-stat chem 8, ED (not at [facility] or [facility])(!) [EW]    Clinical Course User Index [EW] Daleen Bo, MD   MDM Rules/Calculators/A&P                       Patient Vitals for the past 24 hrs:  BP Pulse Resp SpO2  09/06/19 2030 (!) 129/54 75 18 100 %  09/06/19 2023 -- 73 12 98 %  09/06/19 2015 (!) 124/41 78 12 100 %  09/06/19 1741 (!) 108/44 73 16 98 %  09/06/19 1547 (!) 117/51 81 16 98 %    9:25 PM Reevaluation with  update and discussion. After initial assessment and treatment, an updated evaluation reveals patient remains comfortable and oxygenating 96% on room air.  Findings discussed with patient and daughter, all questions answered. Daleen Bo   Medical Decision Making: Patient with apparent hypoxia at dialysis, sent here for evaluation.  No evidence for congestive heart failure, hyperkalemia, metabolic instability or any vascular collapse.  Mild incidental hyperglycemia.  No indication for hospitalization at this time  CRITICAL CARE-no Performed by: Daleen Bo   Nursing Notes Reviewed/ Care Coordinated Applicable Imaging Reviewed Interpretation of Laboratory Data incorporated into ED treatment  The patient appears reasonably screened and/or stabilized for discharge and I doubt any other medical condition or other [facility] requiring further screening, evaluation, or treatment in the ED at this time prior to discharge.  Plan: Home Medications-continue usual medications; Home Treatments-rest, low-carb diet; return here if the recommended treatment, does not improve the symptoms; Recommended follow up-nephrology and  PCP as needed    Final Clinical Impression(s) / ED Diagnoses Final diagnoses:  End stage renal disease ([place])  Hyperglycemia    Rx / DC Orders ED Discharge Orders    None       Daleen Bo, MD 09/06/19 2130

## 2019-09-06 NOTE — ED Notes (Signed)
Caregiver verbalizes understanding of discharge instructions. Opportunity for questioning and answers were provided. pt discharged from ED via wheelchair to daughter

## 2019-09-06 NOTE — Discharge Instructions (Addendum)
Your testing today is normal except the blood sugar is high.  Continue your current treatments at home.  Follow-up for dialysis as scheduled.

## 2019-09-08 ENCOUNTER — Other Ambulatory Visit (HOSPITAL_COMMUNITY): Payer: Self-pay

## 2019-09-09 ENCOUNTER — Other Ambulatory Visit (HOSPITAL_COMMUNITY)
Admission: RE | Admit: 2019-09-09 | Discharge: 2019-09-09 | Disposition: A | Discharge status: Home / Self Care | Payer: Self-pay | Source: Ambulatory Visit | Attending: Vascular Surgery | Admitting: Vascular Surgery

## 2019-09-09 ENCOUNTER — Encounter (HOSPITAL_COMMUNITY): Payer: Self-pay | Admitting: Vascular Surgery

## 2019-09-09 ENCOUNTER — Other Ambulatory Visit: Payer: Self-pay

## 2019-09-09 DIAGNOSIS — U071 COVID-19: Secondary | ICD-10-CM | POA: Insufficient documentation

## 2019-09-09 DIAGNOSIS — Z01812 Encounter for preprocedural laboratory examination: Secondary | ICD-10-CM | POA: Insufficient documentation

## 2019-09-09 NOTE — Progress Notes (Signed)
SDW-pre-op call made using [employer] # [phone]. Pt requested that son Sarah Kelley complete assessment. Son denies that pt C/O SOB and chest pain. Son denies that pt is under the care of a cardiologist. Son denies that pt ha a stress test and cardiac cath. Son made aware to have pt take half dose of Lantus insulin at HS ( 7 units) . Son made aware to have pt check her CBG every 2 hours prior to arrival to hospital on DOS.If CBG is > 70 have pt take  half dose of Lantus insulin  ( 9 units). IF CBG < 31  Son made aware to treat a CBG < 70 with 4 ounces of apple or cranberry juice, wait 15 minutes after intervention to recheck CBG, if CBG remains < 70, call Short Stay unit to speak with a nurse.  Son made aware to have pt quarantine. Son verbalized understanding of all pre-op instructions. See PA, Anesthesiology, note.

## 2019-09-09 NOTE — Anesthesia Preprocedure Evaluation (Deleted)
Anesthesia Evaluation    Reviewed: Allergy & Precautions, Patient's Chart, lab work & pertinent test results  Airway Mallampati: II  TM Distance: >3 FB Neck ROM: Full    Dental no notable dental hx.    Pulmonary neg pulmonary ROS,    Pulmonary exam normal breath sounds clear to auscultation       Cardiovascular hypertension, +CHF  Normal cardiovascular exam Rhythm:Regular Rate:Normal  HLD  Last echo 2019: - Left ventricle: The cavity size was normal. There was mild  asymmetric hypertrophy. Systolic function was normal. The  estimated ejection fraction was in the range of 55% to 60%. Wall  motion was normal; there were no regional wall motion  abnormalities. There was an increased relative contribution of  atrial contraction to ventricular filling. Doppler parameters are  consistent with abnormal left ventricular relaxation (grade 1  diastolic dysfunction).  - Aortic valve: There was mild regurgitation.  - Pulmonary arteries: Systolic pressure could not be accurately  estimated.    Neuro/Psych Diabetic peripheral neuropathy negative psych ROS   GI/Hepatic negative GI ROS, Neg liver ROS,   Endo/Other  diabetes, Poorly Controlled, Type 2, Insulin DependentLast A1c 8.7  Renal/GU ESRFRenal disease  negative genitourinary   Musculoskeletal negative musculoskeletal ROS (+)   Abdominal   Peds negative pediatric ROS (+)  Hematology negative hematology ROS (+)   Anesthesia Other Findings   Reproductive/Obstetrics negative OB ROS                            Anesthesia Physical Anesthesia Plan  ASA: III  Anesthesia Plan: MAC and Regional   Post-op Pain Management:  Regional for Post-op pain   Induction:   PONV Risk Score and Plan: 2 and Propofol infusion and TIVA  Airway Management Planned: Natural Airway and Simple Face Mask  Additional Equipment: None  Intra-op Plan:    Post-operative Plan:   Informed Consent: I have reviewed the patients History and Physical, chart, labs and discussed the procedure including the risks, benefits and alternatives for the proposed anesthesia with the patient or authorized representative who has indicated his/her understanding and acceptance.       Plan Discussed with: CRNA  Anesthesia Plan Comments: (   )       Anesthesia Quick Evaluation

## 2019-09-09 NOTE — Progress Notes (Signed)
Anesthesia Chart Review: Sarah Kelley   Case: [id number] Date/Time: 09/10/19 0815   Procedure: FISTULA SUPERFICIALIZATION OR RIGHT ARM (Right )   Anesthesia type: Monitor Anesthesia Care   Pre-op diagnosis: END STAGE RENAL DISEASE   Location: [place] OR ROOM 11 / [place] OR   Surgeons: Waynetta Sandy, MD      DISCUSSION: Patient is a 69 year old female scheduled for the above procedure.  She is s/p creation of right brachiocephalic AV fistula on 52/02/4131.  History includes never smoker, ESRD (on hemodialysis), DM2 (with neuropathy), HTN, HLD, PAD (s/p left 3rd toe ray amputation 05/21/15).  ED visit 09/06/19 after dialysis for low oxygen saturations. She had not taken insulin that day and glucose was 488. ED work-up showed RA O2 sat of 96%, "No evidence for congestive heart failure, hyperkalemia, metabolic instability or any vascular collapse.  Mild incidental hyperglycemia.  No indication for hospitalization at this time".  She is a same day work-up. Presurgical COVID-19 test is scheduled for 09/09/19 at 2:10 PM. Anesthesia team to evaluate on the day of surgery.   VS:   BP Readings from Last 3 Encounters:  09/06/19 (!) 133/52  08/22/19 (!) 158/78  08/13/19 (!) 171/77   Pulse Readings from Last 3 Encounters:  09/06/19 80  08/22/19 68  08/13/19 63    PROVIDERS: [name], MD is PCP. Last evaluation 08/13/19. A1c 8.7% (down from 9.0 05/13/19) with goal for her < 8.0% given "high risk of hypoglycemia due to underlying comorbid conditions". No change in DM medications made. Six month follow-up planned.    LABS: ISTAT labs on arrival. Glucose 488 (in setting of missed insulin), Cr 4.10, H/H 13.3/39.0 on 09/06/19. A1c 8.7% 08/13/19.    IMAGES: CXR 09/06/19: FINDINGS: Suboptimal inspiration. Cardiac silhouette normal in size, unchanged. Thoracic aorta mildly atherosclerotic, unchanged. Hilar and mediastinal contours otherwise unremarkable. RIGHT jugular dialysis catheter tip at  the cavoatrial junction. Stable scarring involving the lingula and LEFT LOWER LOBE. Lungs otherwise clear. Pulmonary vascularity normal without evidence of pulmonary edema. No pleural effusions. Visualized bony thorax intact. IMPRESSION: No acute cardiopulmonary disease. Stable scarring involving the lingula and LEFT LOWER LOBE.   EKG: 09/06/19: Sinus rhythm with Premature atrial complexes Left anterior fascicular block Left ventricular hypertrophy with QRS widening ( R in aVL , Cornell product ) Nonspecific ST and T wave abnormality Prolonged QT [QT/QTc 428/505 ms] Abnormal ECG since last tracing no significant change Confirmed by Daleen Bo [phone]) on 09/06/2019 7:09:51 PM   CV: Echo 02/13/18: Study Conclusions  - Left ventricle: The cavity size was normal. There was mild  asymmetric hypertrophy. Systolic function was normal. The  estimated ejection fraction was in the range of 55% to 60%. Wall  motion was normal; there were no regional wall motion  abnormalities. There was an increased relative contribution of  atrial contraction to ventricular filling. Doppler parameters are  consistent with abnormal left ventricular relaxation (grade 1  diastolic dysfunction).  - Aortic valve: There was mild regurgitation.  - Pulmonary arteries: Systolic pressure could not be accurately  estimated.    Past Medical History:  Diagnosis Date  . Diabetes mellitus   . ESRD (end stage renal disease) ([place])   . Hyperlipidemia   . Hypertension   . Neuropathy    Diabetic  . Pneumonia 07/2016  . Renal disorder   . Sepsis ([place])   . Thyroid disease   . Wears dentures     Past Surgical History:  Procedure Laterality Date  . AMPUTATION  Left 05/21/2015   Procedure: Left Foot 3rd Ray Amputation;  Surgeon: Newt Minion, MD;  Location: [place];  Service: Orthopedics;  Laterality: Left;  . AV FISTULA PLACEMENT Left 03/28/2016   Procedure: ARTERIOVENOUS (AV) FISTULA CREATION LEFT  ARM;  Surgeon: Waynetta Sandy, MD;  Location: [place];  Service: Vascular;  Laterality: Left;  . AV FISTULA PLACEMENT Right 06/04/2019   Procedure: ARTERIOVENOUS (AV) FISTULA CREATION RIGHT ARM;  Surgeon: Waynetta Sandy, MD;  Location: [place];  Service: Vascular;  Laterality: Right;  . DILATION AND CURETTAGE OF UTERUS    . EYE SURGERY    . FISTULA SUPERFICIALIZATION Left 09/04/2016   Procedure: FISTULA SUPERFICIALIZATION LEFT UPPER ARM;  Surgeon: Waynetta Sandy, MD;  Location: [place];  Service: Vascular;  Laterality: Left;  . IR AV DIALY SHUNT INTRO NEEDLE/INTRAC INITIAL W/PTA/STENT/IMG LT Left 06/13/2017  . IR AV DIALY SHUNT INTRO NEEDLE/INTRAC INITIAL W/PTA/STENT/IMG LT Left 09/12/2017  . IR AV DIALY SHUNT INTRO NEEDLE/INTRACATH INITIAL W/PTA/IMG LEFT  11/10/2016  . IR AV DIALY SHUNT INTRO NEEDLE/INTRACATH INITIAL W/PTA/IMG LEFT  03/09/2017  . IR AV DIALY SHUNT INTRO NEEDLE/INTRACATH INITIAL W/PTA/IMG LEFT  12/12/2017  . IR AV DIALY SHUNT INTRO NEEDLE/INTRACATH INITIAL W/PTA/IMG LEFT  02/27/2018  . IR AV DIALY SHUNT INTRO NEEDLE/INTRACATH INITIAL W/PTA/IMG LEFT  06/05/2018  . IR AV DIALY SHUNT INTRO NEEDLE/INTRACATH INITIAL W/PTA/IMG LEFT  09/11/2018  . IR DIALY SHUNT INTRO NEEDLE/INTRACATH INITIAL W/IMG LEFT Left 05/08/2018  . IR FLUORO GUIDE CV LINE RIGHT  02/01/2019  . IR GENERIC HISTORICAL  08/01/2016   IR FLUORO GUIDE CV LINE RIGHT 08/01/2016 [name], MD [facility]  . IR GENERIC HISTORICAL  08/01/2016   IR US GUIDE VASC ACCESS RIGHT 08/01/2016 [name], MD [facility]  . IR REMOVAL TUN CV CATH W/O FL  11/01/2016  . IR THROMBECTOMY AV FISTULA W/THROMBOLYSIS INC/SHUNT/IMG LEFT Left 01/31/2019  . IR THROMBECTOMY AV FISTULA W/THROMBOLYSIS/PTA INC/SHUNT/IMG LEFT Left 01/27/2019  . IR US GUIDE VASC ACCESS LEFT  11/10/2016  . IR US GUIDE VASC ACCESS LEFT  09/12/2017  . IR US GUIDE VASC ACCESS LEFT  12/12/2017  . IR US GUIDE VASC ACCESS LEFT  06/05/2018  . IR US GUIDE VASC  ACCESS LEFT  09/11/2018  . IR US GUIDE VASC ACCESS LEFT  01/27/2019  . IR US GUIDE VASC ACCESS LEFT  02/01/2019  . IR US GUIDE VASC ACCESS RIGHT  02/01/2019    MEDICATIONS: No current facility-administered medications for this encounter.   Marland Kitchen atorvastatin (LIPITOR) 40 MG tablet  . DULoxetine (CYMBALTA) 60 MG capsule  . gabapentin (NEURONTIN) 300 MG capsule  . insulin glargine (LANTUS) 100 UNIT/ML injection  . lisinopril (ZESTRIL) 20 MG tablet  . Nutritional Supplements (FEEDING SUPPLEMENT, NEPRO CARB STEADY,) LIQD  . Amino Acids-Protein Hydrolys (FEEDING SUPPLEMENT, PRO-STAT SUGAR FREE 64,) LIQD  . Blood Glucose Monitoring Suppl (TRUE METRIX METER) DEVI  . cetirizine (ZYRTEC) 10 MG tablet  . glucose blood (TRUE METRIX BLOOD GLUCOSE TEST) test strip  . oxyCODONE-acetaminophen (PERCOCET) 5-325 MG tablet  . polyethylene glycol powder (GLYCOLAX/MIRALAX) 17 GM/SCOOP powder  . TRUEPLUS INSULIN SYRINGE 31G X 5/16" 1 ML MISC  . TRUEplus Lancets 28G MISC   . 0.9 %  sodium chloride infusion    Myra Gianotti, PA-C Surgical Short Stay/Anesthesiology [facility] Phone [phone] [facility] Phone [phone] 09/09/2019 1:44 PM

## 2019-09-10 ENCOUNTER — Ambulatory Visit (HOSPITAL_COMMUNITY): Admission: RE | Admit: 2019-09-10 | Payer: Self-pay | Source: Home / Self Care | Admitting: Vascular Surgery

## 2019-09-10 LAB — SARS CORONAVIRUS 2 (TAT 6-24 HRS): SARS Coronavirus 2: POSITIVE — AB

## 2019-09-10 SURGERY — FISTULA SUPERFICIALIZATION
Anesthesia: Monitor Anesthesia Care | Laterality: Right

## 2019-09-10 NOTE — Progress Notes (Signed)
Patient with positive covid result. Contacted MD and informed of result.   

## 2019-09-11 ENCOUNTER — Telehealth: Payer: Self-pay | Admitting: Internal Medicine

## 2019-09-11 NOTE — Telephone Encounter (Signed)
Called to discuss with Izora Gala about Covid symptoms and the use of bamlanivimab, a monoclonal antibody infusion for those with mild to moderate Covid symptoms and at a high risk of hospitalization.  Translation done through pt's son Olga Coaster. Pt was tested as screening for pre-surgery.    Pt does not qualify for infusion therapy as she has asymptomatic infection. Isolation precautions discussed. Advised to contact back for consideration should she develop symptoms. Patient's son verbalized understanding.      Patient Active Problem List   Diagnosis Date Noted  . AVF (arteriovenous fistula) ([place])   . MSSA (methicillin susceptible Staphylococcus aureus) infection   . Nausea & vomiting 02/11/2018  . Insomnia 01/18/2017  . Altered mental status   . Goals of care, counseling/discussion   . Oropharyngeal dysphagia 12/04/2016  . UTI (urinary tract infection) 12/04/2016  . Severe sepsis ([place]) 12/02/2016  . Hyperglycemia 12/02/2016  . Acute encephalopathy 07/28/2016  . CAP (community acquired pneumonia) 07/28/2016  . Pneumonia 07/28/2016  . ESRD (end stage renal disease) ([place])   . Phantom pain ([place]) 09/13/2015  . Hyperlipidemia 06/01/2015  . Toe amputation status   . Insulin-requiring or dependent type II diabetes mellitus ([place])   . Chronic kidney disease (CKD) stage G4/A1, severely decreased glomerular filtration rate (GFR) between 15-29 mL/min/1.73 square meter and albuminuria creatinine ratio less than 30 mg/g (HCC) 05/18/2015  . Diabetic infection of left foot ([place]) 05/18/2015  . Osteomyelitis ([place]) 05/18/2015  . Abdominal pain 05/18/2015  . Osteomyelitis of left foot ([place]) 05/18/2015  . Diabetic neuropathy ([place]) 01/14/2007  . Diabetes mellitus without complication ([place]) 42/70/6237  . HLD (hyperlipidemia) 01/14/2007  . Normocytic anemia 01/14/2007  . ANXIETY 01/14/2007  . Depression 01/14/2007  . Essential hypertension 01/14/2007     Alan Ripper,  NP-C [facility]

## 2019-09-16 MED FILL — LISINOPRIL 20 MG TABLET: 20 | 30 days supply | Qty: 30 | Fill #1

## 2019-09-16 MED FILL — GABAPENTIN 300 MG CAPSULE: 300 | 30 days supply | Qty: 30 | Fill #1

## 2019-09-16 MED FILL — TRUEPLUS SYR 1ML 31GX5/16: 31G X 5/16" | 30 days supply | Qty: 100 | Fill #2

## 2019-09-22 MED FILL — LANTUS 100 UNITS/ML VIAL: 100 | 28 days supply | Qty: 10 | Fill #0

## 2019-09-25 MED FILL — DULoxetine HCL 60 MG CPEP: 60 | 30 days supply | Qty: 30 | Fill #4

## 2019-09-25 MED FILL — ATORVASTATIN CALCIUM 40 MG: 40 | 30 days supply | Qty: 30 | Fill #3

## 2019-10-07 ENCOUNTER — Other Ambulatory Visit: Payer: Self-pay

## 2019-10-07 ENCOUNTER — Encounter (HOSPITAL_COMMUNITY): Payer: Self-pay | Admitting: Vascular Surgery

## 2019-10-07 NOTE — Anesthesia Preprocedure Evaluation (Addendum)
Anesthesia Evaluation  Patient identified by MRN, date of birth, ID bandGeneral Assessment Comment:Patient awake  Reviewed: Allergy & Precautions, NPO status , Patient's Chart, lab work & pertinent test results  Airway Mallampati: III  TM Distance: >3 FB Neck ROM: Full    Dental  (+) Missing   Pulmonary neg pulmonary ROS,    Pulmonary exam normal breath sounds clear to auscultation       Cardiovascular hypertension, Pt. on medications Normal cardiovascular exam Rhythm:Regular Rate:Normal  ECG: SR, rate 84   Neuro/Psych PSYCHIATRIC DISORDERS Anxiety Depression negative neurological ROS     GI/Hepatic negative GI ROS, Neg liver ROS,   Endo/Other  diabetes, Insulin Dependent  Renal/GU ESRF and DialysisRenal diseaseOn HD T, R, Sat     Musculoskeletal negative musculoskeletal ROS (+)   Abdominal   Peds  Hematology HLD   Anesthesia Other Findings END STAGE RENAL DISEASE  Reproductive/Obstetrics                           Anesthesia Physical Anesthesia Plan  ASA: IV  Anesthesia Plan: Regional   Post-op Pain Management:    Induction: Intravenous  PONV Risk Score and Plan: 2 and Ondansetron, Propofol infusion and Treatment may vary due to age or medical condition  Airway Management Planned: Nasal Cannula  Additional Equipment:   Intra-op Plan:   Post-operative Plan:   Informed Consent: I have reviewed the patients History and Physical, chart, labs and discussed the procedure including the risks, benefits and alternatives for the proposed anesthesia with the patient or authorized representative who has indicated his/her understanding and acceptance.     Dental advisory given and Consent reviewed with POA  Plan Discussed with: CRNA  Anesthesia Plan Comments:        Anesthesia Quick Evaluation

## 2019-10-07 NOTE — Progress Notes (Signed)
SDW-pre-op call made using [place] # [phone].  Pt requested that son [name] complete assessment. Son denies that pt C/O SOB and chest pain. Son denies that pt is under the care of a cardiologist. Son denies that pt had a stress test and cardiac cath. Son made aware to have pt stop  taking vitamins, fish oil and herbal medications. Do not take any NSAIDs ie: Ibuprofen, Advil, Naproxen (Aleve), Motrin, BC and Goody Powder. Son made aware to have pt take half dose of Lantus insulin at HS ( 7 units) . Son made aware to have pt check her CBGevery 2 hours prior to arrival to hospital on DOS.If CBG is > 70 have pt take  half dose of Lantus insulin  ( 9 units). If CBG < 70  Sonmade aware to treat aCBG < 70 with 4 ounces of apple or cranberry juice, wait 15 minutes after intervention to recheckCBG, ifCBG remains < 70, call Short Stay unit to speak with a nurse. Pt had recent + COVID test. Son verbalized understanding of all pre-op instructions. See PA, Anesthesiology, note.

## 2019-10-08 ENCOUNTER — Other Ambulatory Visit: Payer: Self-pay

## 2019-10-08 ENCOUNTER — Encounter (HOSPITAL_COMMUNITY)
Admission: RE | Disposition: A | Discharge status: Home / Self Care | Payer: Self-pay | Source: Ambulatory Visit | Attending: Vascular Surgery

## 2019-10-08 ENCOUNTER — Ambulatory Visit (HOSPITAL_COMMUNITY): Payer: Self-pay | Admitting: Anesthesiology

## 2019-10-08 ENCOUNTER — Other Ambulatory Visit: Payer: Self-pay | Admitting: Physician Assistant

## 2019-10-08 ENCOUNTER — Encounter (HOSPITAL_COMMUNITY): Payer: Self-pay | Admitting: Vascular Surgery

## 2019-10-08 ENCOUNTER — Ambulatory Visit (HOSPITAL_COMMUNITY)
Admission: RE | Admit: 2019-10-08 | Discharge: 2019-10-08 | Disposition: A | Discharge status: Home / Self Care | Payer: Self-pay | Source: Ambulatory Visit | Attending: Vascular Surgery | Admitting: Vascular Surgery

## 2019-10-08 DIAGNOSIS — Y832 Surgical operation with anastomosis, bypass or graft as the cause of abnormal reaction of the patient, or of later complication, without mention of misadventure at the time of the procedure: Secondary | ICD-10-CM | POA: Insufficient documentation

## 2019-10-08 DIAGNOSIS — T82510A Breakdown (mechanical) of surgically created arteriovenous fistula, initial encounter: Secondary | ICD-10-CM | POA: Insufficient documentation

## 2019-10-08 DIAGNOSIS — Z992 Dependence on renal dialysis: Secondary | ICD-10-CM

## 2019-10-08 DIAGNOSIS — N186 End stage renal disease: Secondary | ICD-10-CM | POA: Insufficient documentation

## 2019-10-08 DIAGNOSIS — T82898A Other specified complication of vascular prosthetic devices, implants and grafts, initial encounter: Secondary | ICD-10-CM

## 2019-10-08 HISTORY — PX: FISTULA SUPERFICIALIZATION: SHX6341

## 2019-10-08 LAB — GLUCOSE, CAPILLARY
Glucose-Capillary: 69 mg/dL — ABNORMAL LOW (ref 70–99)
Glucose-Capillary: 103 mg/dL — ABNORMAL HIGH (ref 70–99)
Glucose-Capillary: 157 mg/dL — ABNORMAL HIGH (ref 70–99)
Glucose-Capillary: 121 mg/dL — ABNORMAL HIGH (ref 70–99)

## 2019-10-08 LAB — POCT I-STAT, CHEM 8
BUN: 27 mg/dL — ABNORMAL HIGH (ref 8–23)
Calcium, Ion: 1.13 mmol/L — ABNORMAL LOW (ref 1.15–1.40)
Chloride: 98 mmol/L (ref 98–111)
Creatinine, Ser: 5.8 mg/dL — ABNORMAL HIGH (ref 0.44–1.00)
Glucose, Bld: 70 mg/dL (ref 70–99)
HCT: 37 % (ref 36.0–46.0)
Hemoglobin: 12.6 g/dL (ref 12.0–15.0)
Potassium: 3.8 mmol/L (ref 3.5–5.1)
Sodium: 140 mmol/L (ref 135–145)
TCO2: 33 mmol/L — ABNORMAL HIGH (ref 22–32)

## 2019-10-08 SURGERY — FISTULA SUPERFICIALIZATION
Anesthesia: Regional | Site: Arm Upper | Laterality: Right

## 2019-10-08 MED ORDER — DEXTROSE 50 % IV SOLN
INTRAVENOUS | Status: AC
  Administered 2019-10-08: 25 mL via INTRAVENOUS
  Filled 2019-10-08: qty 50

## 2019-10-08 MED ORDER — LIDOCAINE-EPINEPHRINE 0.5 %-1:200000 IJ SOLN
INTRAMUSCULAR | Status: AC
  Filled 2019-10-08: qty 1

## 2019-10-08 MED ORDER — CHLORHEXIDINE GLUCONATE 4 % EX LIQD: 60.0000 mL | Freq: Once | CUTANEOUS | Status: DC

## 2019-10-08 MED ORDER — DEXTROSE 50 % IV SOLN: 25.0000 mL | INTRAVENOUS | Status: AC

## 2019-10-08 MED ORDER — ONDANSETRON HCL 4 MG/2ML IJ SOLN
INTRAMUSCULAR | Status: DC | PRN
  Administered 2019-10-08: 4 mg via INTRAVENOUS

## 2019-10-08 MED ORDER — OXYCODONE-ACETAMINOPHEN 5-325 MG PO TABS: 1.0000 | ORAL_TABLET | Freq: Four times a day (QID) | ORAL | 0 refills | Status: DC | PRN

## 2019-10-08 MED ORDER — LIDOCAINE-EPINEPHRINE 0.5 %-1:200000 IJ SOLN: INTRAMUSCULAR | Status: DC | PRN

## 2019-10-08 MED ORDER — PAPAVERINE HCL 30 MG/ML IJ SOLN
INTRAMUSCULAR | Status: AC
  Filled 2019-10-08: qty 2

## 2019-10-08 MED ORDER — SODIUM CHLORIDE 0.9 % IV SOLN
INTRAVENOUS | Status: DC
  Administered 2019-10-08: 07:00:00 10 mL/h via INTRAVENOUS

## 2019-10-08 MED ORDER — SODIUM CHLORIDE 0.9 % IV SOLN
INTRAVENOUS | Status: DC | PRN
  Administered 2019-10-08: 500 mL

## 2019-10-08 MED ORDER — LIDOCAINE-EPINEPHRINE (PF) 1.5 %-1:200000 IJ SOLN
INTRAMUSCULAR | Status: DC | PRN
  Administered 2019-10-08: 30 mL via PERINEURAL

## 2019-10-08 MED ORDER — PROPOFOL 500 MG/50ML IV EMUL
INTRAVENOUS | Status: DC | PRN
  Administered 2019-10-08: 50 ug/kg/min via INTRAVENOUS

## 2019-10-08 MED ORDER — CEFAZOLIN SODIUM-DEXTROSE 2-4 GM/100ML-% IV SOLN
2.0000 g | INTRAVENOUS | Status: AC
  Administered 2019-10-08: 2 g via INTRAVENOUS
  Filled 2019-10-08: qty 100

## 2019-10-08 MED ORDER — ACETAMINOPHEN 500 MG PO TABS
500.0000 mg | ORAL_TABLET | Freq: Once | ORAL | Status: AC
  Administered 2019-10-08: 500 mg via ORAL
  Filled 2019-10-08: qty 1

## 2019-10-08 MED ORDER — ALBUMIN HUMAN 5 % IV SOLN
INTRAVENOUS | Status: AC
  Filled 2019-10-08: qty 250

## 2019-10-08 MED ORDER — PHENYLEPHRINE HCL-NACL 10-0.9 MG/250ML-% IV SOLN
INTRAVENOUS | Status: DC | PRN
  Administered 2019-10-08: 25 ug/min via INTRAVENOUS

## 2019-10-08 MED ORDER — PROPOFOL 10 MG/ML IV BOLUS
INTRAVENOUS | Status: AC
  Filled 2019-10-08: qty 20

## 2019-10-08 MED ORDER — SODIUM CHLORIDE 0.9 % IV SOLN
INTRAVENOUS | Status: AC
  Filled 2019-10-08: qty 1.2

## 2019-10-08 MED ORDER — 0.9 % SODIUM CHLORIDE (POUR BTL) OPTIME
TOPICAL | Status: DC | PRN
  Administered 2019-10-08: 08:00:00 1000 mL

## 2019-10-08 MED ORDER — FENTANYL CITRATE (PF) 100 MCG/2ML IJ SOLN
INTRAMUSCULAR | Status: DC | PRN
  Administered 2019-10-08: 50 ug via INTRAVENOUS

## 2019-10-08 MED ORDER — MIDAZOLAM HCL 2 MG/2ML IJ SOLN
INTRAMUSCULAR | Status: AC
  Filled 2019-10-08: qty 2

## 2019-10-08 MED ORDER — FENTANYL CITRATE (PF) 250 MCG/5ML IJ SOLN
INTRAMUSCULAR | Status: AC
  Filled 2019-10-08: qty 5

## 2019-10-08 MED ORDER — ALBUMIN HUMAN 5 % IV SOLN
12.5000 g | Freq: Once | INTRAVENOUS | Status: AC
  Administered 2019-10-08: 12.5 g via INTRAVENOUS

## 2019-10-08 SURGICAL SUPPLY — 31 items
ADH SKN CLS APL DERMABOND .7 (GAUZE/BANDAGES/DRESSINGS) ×1
ARMBAND PINK RESTRICT EXTREMIT (MISCELLANEOUS) ×2 IMPLANT
CANISTER SUCT 3000ML PPV (MISCELLANEOUS) ×2 IMPLANT
CLIP VESOCCLUDE MED 6/CT (CLIP) ×2 IMPLANT
CLIP VESOCCLUDE SM WIDE 6/CT (CLIP) ×4 IMPLANT
COVER PROBE W GEL 5X96 (DRAPES) ×2 IMPLANT
COVER WAND RF STERILE (DRAPES) ×1 IMPLANT
DERMABOND ADVANCED (GAUZE/BANDAGES/DRESSINGS) ×1
DERMABOND ADVANCED .7 DNX12 (GAUZE/BANDAGES/DRESSINGS) ×1 IMPLANT
ELECT REM PT RETURN 9FT ADLT (ELECTROSURGICAL) ×2
ELECTRODE REM PT RTRN 9FT ADLT (ELECTROSURGICAL) ×1 IMPLANT
GLOVE BIO SURGEON STRL SZ7.5 (GLOVE) ×2 IMPLANT
GOWN STRL REUS W/ TWL LRG LVL3 (GOWN DISPOSABLE) ×2 IMPLANT
GOWN STRL REUS W/ TWL XL LVL3 (GOWN DISPOSABLE) ×1 IMPLANT
GOWN STRL REUS W/TWL LRG LVL3 (GOWN DISPOSABLE) ×4
GOWN STRL REUS W/TWL XL LVL3 (GOWN DISPOSABLE) ×2
KIT BASIN OR (CUSTOM PROCEDURE TRAY) ×2 IMPLANT
KIT TURNOVER KIT B (KITS) ×2 IMPLANT
NS IRRIG 1000ML POUR BTL (IV SOLUTION) ×2 IMPLANT
PACK CV ACCESS (CUSTOM PROCEDURE TRAY) ×2 IMPLANT
PAD ARMBOARD 7.5X6 YLW CONV (MISCELLANEOUS) ×4 IMPLANT
SUT MNCRL AB 4-0 PS2 18 (SUTURE) ×3 IMPLANT
SUT PROLENE 6 0 BV (SUTURE) ×4 IMPLANT
SUT PROLENE 6 0 CC (SUTURE) ×3 IMPLANT
SUT SILK 3 0 (SUTURE) ×2
SUT SILK 3-0 18XBRD TIE 12 (SUTURE) IMPLANT
SUT VIC AB 3-0 SH 27 (SUTURE) ×2
SUT VIC AB 3-0 SH 27X BRD (SUTURE) ×1 IMPLANT
TOWEL GREEN STERILE (TOWEL DISPOSABLE) ×2 IMPLANT
UNDERPAD 30X30 (UNDERPADS AND DIAPERS) ×2 IMPLANT
WATER STERILE IRR 1000ML POUR (IV SOLUTION) ×2 IMPLANT

## 2019-10-08 NOTE — Op Note (Signed)
    Patient name: Sarah Kelley MRN: [id number] DOB: 03-23-1951 Sex: female  10/08/2019 Pre-operative Diagnosis: esrd Post-operative diagnosis:  Same Surgeon:  Erlene Quan C. Donzetta Matters, MD Assistant: Leontine Locket, PA Procedure Performed:   Revision with transposition right arm cephalic vein fistula  Indications: 69 year old female with history of end-stage renal disease dialyzing via catheter.  She has undergone a right arm cephalic vein fistula creation.  Unfortunate this is too deep for use she is now indicated for revision.  Findings: Fistula throughout its course measured approximately 0.5 cm.  It was actually quite thin-walled.  There were multiple branches divided between clips and ties.  There was 1 area that was quite sclerotic and was injured.  I elected to divide the fistula there tunneled it in the reverse direction.  At completion the most cephalad portion of the vein is actually only approximately 4 mm the rest of it much larger.  There is pulsatility in the fistula.  I would not attempt cannulating this fistula for 6 to 8 weeks given the thin-walled nature of it.  If this fistula does not work she would be conversion to right arm AV graft.   Procedure:  The patient was identified in the holding area and taken to the operating room where she was placed supine operative table and MAC anesthesia was induced.  She had undergone preop regional block.  This was tested noted to be intact.  Timeout was called and antibiotics administered.  We began with longitudinal incision overlying the palpable fistula near the antecubitum.  We made a separate counterincision.  Branches were taken between clips and ties.  Patient was noted to be quite thin-walled.  We marked it for orientation after dissecting out entirely.  There was 1 area that was injured more cephalad on the fistula.  I extended the cephalad incision.  I elected to transect it there.  I clamped the in the proximal incision and  cephalad.  I transected where the injury occurred.  I actually removed this injured area that I tried repaired with 6-0 Prolene suture.  I flushed both directions with heparinized saline.  I tunneled from the antecubitum more cephalad just under the skin.  I then sewed them end-to-end with 6-0 Prolene suture.  Prior to completion the left lower sternal directions.  Upon completion patient was actually large more distal in the arm more cephalad was more diminutive had some pulsatility.  Given the thin-walled nature of this vein I elected not to pursue any further intervention.  She did have good palpable radial artery pulse the pulse at the wrist.  Her hand was well-perfused.  We irrigated the wounds obtain hemostasis and closed the skin overlying the fistula for Monocryl.  Dermabond is placed to the level of skin.  She was then awakened from anesthesia having tolerated procedure well immediate complication.  All counts were correct at completion.  EBL: 150 cc    Alpha Chouinard C. Donzetta Matters, MD [facility] [place] Office: [phone] Pager: [phone]

## 2019-10-08 NOTE — Anesthesia Postprocedure Evaluation (Signed)
Anesthesia Post Note  Patient: Sarah Kelley  Procedure(s) Performed: FISTULA SUPERFICIALIZATION OR RIGHT ARM (Right Arm Upper)     Patient location during evaluation: PACU Anesthesia Type: Regional Level of consciousness: awake and alert Pain management: pain level controlled Vital Signs Assessment: post-procedure vital signs reviewed and stable Respiratory status: spontaneous breathing, nonlabored ventilation, respiratory function stable and patient connected to nasal cannula oxygen Cardiovascular status: stable and blood pressure returned to baseline Postop Assessment: no apparent nausea or vomiting Anesthetic complications: no    Last Vitals:  Vitals:   10/08/19 1045 10/08/19 1100  BP: (!) 114/57 (!) 109/59  Pulse: 71 81  Resp: 13 13  Temp:  36.4 C  SpO2: 100% 95%    Last Pain:  Vitals:   10/08/19 1100  PainSc: 0-No pain                 Babs Dabbs P Kiyra Slaubaugh

## 2019-10-08 NOTE — H&P (Signed)
    Subjective:      HPI 69 year old female follows up after right arm AV fistula.  She does not have any new hand or arm issues.  Currently doing well on dialysis Tuesdays, Thursdays, Saturdays via tunneled dialysis catheter.  History obtained from interpreter.  Review of Systems No acute issues    Objective:   Vitals:   10/08/19 0649  BP: (!) 135/42  Pulse: 66  Resp: 17  Temp: 98.5 F (36.9 C)  SpO2: 100%    Awake alert oriented Nonlabored aspirations Palpable fistula in right upper extremity Nonpalpable radial or ulnar pulse Grip strength 5 out of 5 on the right    I have independently interpreted her dialysis duplex which demonstrates flow volume 777 mL/min.  Diameter is up to 0.52 cm.  Depth is up to 2.85 cm at the shoulder in the distal upper arm 0.8 cm.     Assessment:   Assessment    69 year old female with end-stage renal disease status post right cephalic vein fistula creation that is maturing nicely but is too deep for use.    Plan:   Plan    We will plan surgical transposition versus superficialization today in OR. Discussed risks and benefits with patient and son via interpreter and all questions answered.     Reakwon Barren C. Donzetta Matters, MD [facility] [place] Office: [phone] Pager: [phone]

## 2019-10-08 NOTE — Transfer of Care (Signed)
Immediate Anesthesia Transfer of Care Note  Patient: Sarah Kelley  Procedure(s) Performed: FISTULA SUPERFICIALIZATION OR RIGHT ARM (Right Arm Upper)  Patient Location: PACU  Anesthesia Type:MAC and Regional  Level of Consciousness: drowsy  Airway & Oxygen Therapy: Patient Spontanous Breathing and Patient connected to nasal cannula oxygen  Post-op Assessment: Report given to RN  Post vital signs: Reviewed and stable  Last Vitals:  Vitals Value Taken Time  BP    Temp    Pulse 73 10/08/19 0958  Resp 10 10/08/19 0958  SpO2 100 % 10/08/19 0958  Vitals shown include unvalidated device data.  Last Pain:  Vitals:   10/08/19 0704  PainSc: 0-No pain      Patients Stated Pain Goal: 4 (61/47/09 2957)  Complications: No apparent anesthesia complications

## 2019-10-08 NOTE — Anesthesia Procedure Notes (Signed)
Anesthesia Regional Block: Supraclavicular block   Pre-Anesthetic Checklist: ,, timeout performed, Correct Patient, Correct Site, Correct Laterality, Correct Procedure, Correct Position, site marked, Risks and benefits discussed,  Surgical consent,  Pre-op evaluation,  At surgeon's request and post-op pain management  Laterality: Right  Prep: chloraprep       Needles:  Injection technique: Single-shot  Needle Type: Echogenic Stimulator Needle     Needle Length: 9cm  Needle Gauge: 21     Additional Needles:   Procedures:,,,, ultrasound used (permanent image in chart),,,,  Narrative:  Start time: 10/08/2019 8:15 AM End time: 10/08/2019 8:25 AM Injection made incrementally with aspirations every 5 mL.  Performed by: Personally  Anesthesiologist: Murvin Natal, MD  Additional Notes: Functioning IV was confirmed and monitors were applied.  A timeout was performed. Sterile prep, hand hygiene and sterile gloves were used. A 42mm 21ga Arrow echogenic stimulator needle was used. Negative aspiration and negative test dose prior to incremental administration of local anesthetic. The patient tolerated the procedure well.  Ultrasound guidance: relevent anatomy identified, needle position confirmed, local anesthetic spread visualized around nerve(s), vascular puncture avoided.  Image printed for medical record.

## 2019-10-08 NOTE — Anesthesia Procedure Notes (Signed)
Procedure Name: MAC Date/Time: 10/08/2019 8:35 AM Performed by: Barrington Ellison, CRNA Pre-anesthesia Checklist: Patient identified, Emergency Drugs available, Suction available and Patient being monitored Patient Re-evaluated:Patient Re-evaluated prior to induction Oxygen Delivery Method: Simple face mask

## 2019-10-08 NOTE — Progress Notes (Addendum)
Hypoglycemic Event  CBG: 69. Per patient's son, her sugar was 61 at 0530 prior to arrival, and he gave her 3 oz cranberry juice.  Treatment: D50 25 mL (12.5 gm), per Hypoglycemic protocol  Symptoms: None  Follow-up CBG: Time: 06:35 CBG Result: 157  Possible Reasons for Event: Unknown  Comments/MD notified: Dr. Roanna Banning Notified    Feliz Beam, RN

## 2019-10-08 NOTE — Discharge Instructions (Signed)
[facility]  Discharge Instructions  AV Fistula or Graft Surgery for Dialysis Access  Please refer to the following instructions for your post-procedure care. Your surgeon or physician assistant will discuss any changes with you.  Activity  You may drive the day following your surgery, if you are comfortable and no longer taking prescription pain medication. Resume full activity as the soreness in your incision resolves.  Bathing/Showering  You may shower after you go home. Keep your incision dry for 48 hours. Do not soak in a bathtub, hot tub, or swim until the incision heals completely. You may not shower if you have a hemodialysis catheter.  Incision Care  Clean your incision with mild soap and water after 48 hours. Pat the area dry with a clean towel. You do not need a bandage unless otherwise instructed. Do not apply any ointments or creams to your incision. You may have skin glue on your incision. Do not peel it off. It will come off on its own in about one week. Your arm may swell a bit after surgery. To reduce swelling use pillows to elevate your arm so it is above your heart. Your doctor will tell you if you need to lightly wrap your arm with an ACE bandage.  Diet  Resume your normal diet. There are not special food restrictions following this procedure. In order to heal from your surgery, it is CRITICAL to get adequate nutrition. Your body requires vitamins, minerals, and protein. Vegetables are the best source of vitamins and minerals. Vegetables also provide the perfect balance of protein. Processed food has little nutritional value, so try to avoid this.  Medications  Resume taking all of your medications. If your incision is causing pain, you may take over-the counter pain relievers such as acetaminophen (Tylenol). If you were prescribed a stronger pain medication, please be aware these medications can cause nausea and constipation. Prevent  nausea by taking the medication with a snack or meal. Avoid constipation by drinking plenty of fluids and eating foods with high amount of fiber, such as fruits, vegetables, and grains.  Do not take Tylenol if you are taking prescription pain medications.  Follow up Your surgeon may want to see you in the office following your access surgery. If so, this will be arranged at the time of your surgery.  Please call us immediately for any of the following conditions:  . Increased pain, redness, drainage (pus) from your incision site . Fever of 101 degrees or higher . Severe or worsening pain at your incision site . Hand pain or numbness. .  Reduce your risk of vascular disease:  . Stop smoking. If you would like help, call [facility] at 1-800-QUIT-NOW (218)237-1024) or [place] at (505)169-0609  . Manage your cholesterol . Maintain a desired weight . Control your diabetes . Keep your blood pressure down  Dialysis  It will take several weeks to several months for your new dialysis access to be ready for use. Your surgeon will determine when it is okay to use it. Your nephrologist will continue to direct your dialysis. You can continue to use your Permcath until your new access is ready for use.   10/08/2019 Sarah Kelley [id number] 1951/06/07  Surgeon(s): Waynetta Sandy, MD  Procedure(s): FISTULA SUPERFICIALIZATION OR RIGHT ARM  x Do not stick fistula for 8 weeks THE VEIN IS THIN-NEEDS TO MATURE LONGER.    If you have any questions, please call the office at [phone].

## 2019-10-16 ENCOUNTER — Telehealth: Payer: Self-pay | Admitting: Family Medicine

## 2019-10-16 DIAGNOSIS — E119 Type 2 diabetes mellitus without complications: Secondary | ICD-10-CM

## 2019-10-16 MED FILL — LISINOPRIL 20 MG TABLET: 20 | 30 days supply | Qty: 30 | Fill #2

## 2019-10-16 MED FILL — GABAPENTIN 300 MG CAPSULE: 300 | 30 days supply | Qty: 30 | Fill #2

## 2019-10-16 NOTE — Telephone Encounter (Signed)
1) Medication(s) Requested (by name): Test Strips  2) Pharmacy of Choice: [facility] pharmacy   3) Special Requests: Patient is out of test strips    Approved medications will be sent to the pharmacy, we will reach out if there is an issue.  Requests made after 3pm may not be addressed until the following business day!  If a patient is unsure of the name of the medication(s) please note and ask patient to call back when they are able to provide all info, do not send to responsible party until all information is available!

## 2019-10-17 MED ORDER — TRUE METRIX BLOOD GLUCOSE TEST VI STRP
ORAL_STRIP | 12 refills | Status: DC
  Filled 2020-10-05: qty 100, 33d supply, fill #0

## 2019-10-17 MED FILL — TRUE METRIX TEST STRIP: 33 days supply | Qty: 100 | Fill #0

## 2019-10-17 NOTE — Telephone Encounter (Signed)
Sent!

## 2019-10-27 MED FILL — LANTUS 100 UNITS/ML VIAL: 100 | 28 days supply | Qty: 10 | Fill #1

## 2019-10-28 MED FILL — TRUE METRIX TEST STRIP: 33 days supply | Qty: 100 | Fill #0

## 2019-10-31 MED FILL — ATORVASTATIN CALCIUM 40 MG: 40 | 30 days supply | Qty: 30 | Fill #4

## 2019-10-31 MED FILL — DULoxetine HCL 60 MG CPEP: 60 | 30 days supply | Qty: 30 | Fill #5

## 2019-11-10 ENCOUNTER — Other Ambulatory Visit: Payer: Self-pay

## 2019-11-10 ENCOUNTER — Ambulatory Visit (INDEPENDENT_AMBULATORY_CARE_PROVIDER_SITE_OTHER): Payer: Self-pay | Admitting: Physician Assistant

## 2019-11-10 VITALS — BP 133/49 | HR 63 | Temp 97.2°F | Resp 16

## 2019-11-10 DIAGNOSIS — N186 End stage renal disease: Secondary | ICD-10-CM

## 2019-11-10 NOTE — Progress Notes (Signed)
POST OPERATIVE OFFICE NOTE    CC:  F/u for surgery  HPI:  This is a 69 y.o. female who is s/p revision with transposition right arm cephalic vein fistula on 09/05/5730 by Dr. Donzetta Matters.  She is here with her son for follow up.  The ipad with the translator is used for communication.    The son tells me that she lives with him full time and she has not complained about any pain in her hand.  She states she does have some pain in the upper arm.   She is currently dialyzing via a TDC that was placed by IR at [facility].    Her son tells me that she is having seizures and describes them as she starts to shake and her eyes roll back in her head.  He states they do not last long.  He states that it can sometimes happen daily or a couple times a day.  Sometimes, it doesn't happen.  He states he has asked the kidney center for a referral to be evaluated but he has not gotten an appointment yet.    The pt does not have evidence of steal sx. The pt is on dialysis at the [facility] location  No Known Allergies  Current Outpatient Medications  Medication Sig Dispense Refill  . Amino Acids-Protein Hydrolys (FEEDING SUPPLEMENT, PRO-STAT SUGAR FREE 64,) LIQD Take 30 mLs by mouth 2 (two) times daily. 900 mL 0  . atorvastatin (LIPITOR) 40 MG tablet Take 1 tablet (40 mg total) by mouth daily. 30 tablet 6  . Blood Glucose Monitoring Suppl (TRUE METRIX METER) DEVI 1 each by Does not apply route 3 (three) times daily before meals. 1 Device 0  . cetirizine (ZYRTEC) 10 MG tablet Take 1 tablet (10 mg total) by mouth daily. 30 tablet 1  . DULoxetine (CYMBALTA) 60 MG capsule Take 1 capsule (60 mg total) by mouth daily. (Patient taking differently: Take 60 mg by mouth every evening. ) 30 capsule 6  . gabapentin (NEURONTIN) 300 MG capsule Take 1 capsule (300 mg total) by mouth at bedtime. (Patient taking differently: Take 300 mg by mouth daily in the afternoon. ) 30 capsule 6  . glucose blood (TRUE METRIX BLOOD GLUCOSE TEST)  test strip Use 3 times daily before meals 100 each 12  . insulin glargine (LANTUS) 100 UNIT/ML injection Inject subcutaneously twice daily 20 units in the morning and 15 units in the evening (Patient taking differently: Inject 14-18 Units into the skin See admin instructions. Inject 18 units in the morning and 14 units in the evening) 30 mL 5  . lisinopril (ZESTRIL) 20 MG tablet Take 1 tablet (20 mg total) by mouth daily. 30 tablet 6  . Nutritional Supplements (FEEDING SUPPLEMENT, NEPRO CARB STEADY,) LIQD Take 237 mLs by mouth Every Tuesday,Thursday,and Saturday with dialysis.     Marland Kitchen polyethylene glycol powder (GLYCOLAX/MIRALAX) 17 GM/SCOOP powder Take 17 g by mouth 2 (two) times daily as needed. 3350 g 1  . sucroferric oxyhydroxide (VELPHORO) 500 MG chewable tablet Chew 500 mg by mouth 2 (two) times daily with a meal.     . TRUEPLUS INSULIN SYRINGE 31G X 5/16" 1 ML MISC USE AS DIRECTED 100 each 5  . TRUEplus Lancets 28G MISC USE AS DIRECTED THREE TIMES DAILY TO TEST SUGAR BEFORE MEALS 100 each 12  . oxyCODONE-acetaminophen (PERCOCET) 5-325 MG tablet Take 1 tablet by mouth every 6 (six) hours as needed. 12 tablet 0   No current facility-administered medications for  this visit.   Facility-Administered Medications Ordered in Other Visits  Medication Dose Route Frequency Provider Last Rate Last Admin  . 0.9 %  sodium chloride infusion   Intravenous Continuous Monia Sabal, PA-C         ROS:  See HPI  Physical Exam:  Today's Vitals   11/10/19 1403  BP: (!) 133/49  Pulse: 63  Resp: 16  Temp: (!) 97.2 F (36.2 C)  TempSrc: Temporal  SpO2: 100%  PainSc: 5    There is no height or weight on file to calculate BMI.   Incision:  Healed nicely  Extremities:  There is a palpable right radial pulse.  Motor and sensory are in tact.  There is a thrill/bruit present.    Assessment/Plan:  This is a 69 y.o. female who is s/p: revision with transposition right arm cephalic vein fistula on  10/08/2019 by Dr. Donzetta Matters  -the pt does not have evidence of steal and has a palpable right radial pulse.  -the fistula is easily palpable and given that the vein was found to be thin walled at surgery, it can be used 12/08/2019. -pt has tunneled dialysis catheter that was placed by IR. Once the access has been used successfully to the satisfaction of the dialysis center, the tunneled catheter can be scheduled to be removed at their discretion.   -pt's son concerned about her having seizures.  I have put in a referral to neurology to be evaluated.  I discussed with the son that he could go to ER or call 911 the next time this happens.   -the pt will follow up with [facility] as needed.   Leontine Locket, [facility] [phone]  Clinic MD:  Trula Slade

## 2019-11-28 MED FILL — GABAPENTIN 300 MG CAPSULE: 300 | 30 days supply | Qty: 30 | Fill #3

## 2019-11-28 MED FILL — ATORVASTATIN CALCIUM 40 MG: 40 | 30 days supply | Qty: 30 | Fill #5

## 2019-12-05 MED FILL — DULoxetine HCL 60 MG CPEP: 60 | 30 days supply | Qty: 30 | Fill #6

## 2019-12-05 MED FILL — LANTUS 100 UNITS/ML VIAL: 100 | 28 days supply | Qty: 10 | Fill #2

## 2019-12-23 ENCOUNTER — Other Ambulatory Visit: Payer: Self-pay | Admitting: Family Medicine

## 2019-12-23 ENCOUNTER — Other Ambulatory Visit (HOSPITAL_COMMUNITY): Payer: Self-pay | Admitting: Nephrology

## 2019-12-23 DIAGNOSIS — N186 End stage renal disease: Secondary | ICD-10-CM

## 2019-12-24 ENCOUNTER — Other Ambulatory Visit: Payer: Self-pay | Admitting: Family Medicine

## 2019-12-24 ENCOUNTER — Encounter: Payer: Self-pay | Admitting: *Deleted

## 2019-12-25 MED FILL — ETHYL CHLORIDE AERO: 30 days supply | Qty: 116 | Fill #0

## 2019-12-26 ENCOUNTER — Other Ambulatory Visit: Payer: Self-pay

## 2019-12-26 ENCOUNTER — Ambulatory Visit (INDEPENDENT_AMBULATORY_CARE_PROVIDER_SITE_OTHER): Payer: MEDICAID | Admitting: Diagnostic Neuroimaging

## 2019-12-26 ENCOUNTER — Encounter: Payer: Self-pay | Admitting: Diagnostic Neuroimaging

## 2019-12-26 ENCOUNTER — Telehealth: Payer: Self-pay | Admitting: Diagnostic Neuroimaging

## 2019-12-26 VITALS — BP 99/52 | HR 75

## 2019-12-26 DIAGNOSIS — R4020 Unspecified coma: Secondary | ICD-10-CM

## 2019-12-26 MED FILL — GABAPENTIN 300 MG CAPSULE: 300 | 30 days supply | Qty: 30 | Fill #4

## 2019-12-26 NOTE — Progress Notes (Signed)
[facility]  PATIENT: Sarah Kelley DOB: [birth year]  REFERRING CLINICIAN: Rhyne, Hulen Shouts, PA-C HISTORY FROM: patient  REASON FOR VISIT: new consult    HISTORICAL  CHIEF COMPLAINT:  Chief Complaint  Patient presents with  . New Patient (Initial Visit)    seizures; She has had seizures for the last 2-3 months per son's report. When she coughs or increases effort she gets stiff and her eyes roll back. It can happen anytime though. Son has witnessed 2-3 episodes where she did not cough first. The son completed DPR and states pt unable to read or white (no schooling) and cannot answer/comprehend question.    . Referral    Rhyne, Hulen Shouts, PA-C  . Room 7    here with son and [facility] Spanish interpreter Nicole Kindred    HISTORY OF PRESENT ILLNESS:   69 year old female with end-stage renal disease, here for evaluation of intermittent syncope for seizure.  For past 3 to 6 months patient has had intermittent episodes of 30 seconds of eyes rolling back, brief tremors and convulsions, with rapid return to consciousness.  This can happen once or twice a day up to 4-5 times per week.  No specific triggering factors.  Sometimes this occurs when she returns from dialysis.  Sometimes this occurs at other times randomly.  Patient also has had progressive confusion, memory loss, encephalopathy.  She has had multiple hospitalizations for infections and sepsis.   REVIEW OF SYSTEMS: Full 14 system review of systems performed and negative with exception of: As per HPI.  ALLERGIES: No Known Allergies  HOME MEDICATIONS: Outpatient Medications Prior to Visit  Medication Sig Dispense Refill  . Amino Acids-Protein Hydrolys (FEEDING SUPPLEMENT, PRO-STAT SUGAR FREE 64,) LIQD Take 30 mLs by mouth 2 (two) times daily. 900 mL 0  . atorvastatin (LIPITOR) 40 MG tablet Take 1 tablet (40 mg total) by mouth daily. 30 tablet 6  . Blood Glucose Monitoring Suppl (TRUE METRIX METER) DEVI 1  each by Does not apply route 3 (three) times daily before meals. 1 Device 0  . cetirizine (ZYRTEC) 10 MG tablet Take 1 tablet (10 mg total) by mouth daily. 30 tablet 1  . DULoxetine (CYMBALTA) 60 MG capsule Take 1 capsule (60 mg total) by mouth daily. (Patient taking differently: Take 60 mg by mouth every evening. ) 30 capsule 6  . ethyl chloride spray SPRAY 1 SPRAY TO THE SKIN 3 TIMES A WEEK AS NEEDED. SPRAY A SMALL AMOUNT JUST PRIOR TO NEEDLE INSERTION. 116 mL 10  . gabapentin (NEURONTIN) 300 MG capsule Take 1 capsule (300 mg total) by mouth at bedtime. (Patient taking differently: Take 300 mg by mouth daily in the afternoon. ) 30 capsule 6  . glucose blood (TRUE METRIX BLOOD GLUCOSE TEST) test strip Use 3 times daily before meals 100 each 12  . insulin glargine (LANTUS) 100 UNIT/ML injection Inject subcutaneously twice daily 20 units in the morning and 15 units in the evening (Patient taking differently: Inject 14-18 Units into the skin See admin instructions. Inject 18 units in the morning and 14 units in the evening) 30 mL 5  . lisinopril (ZESTRIL) 20 MG tablet Take 1 tablet (20 mg total) by mouth daily. 30 tablet 6  . Nutritional Supplements (FEEDING SUPPLEMENT, NEPRO CARB STEADY,) LIQD Take 237 mLs by mouth Every Tuesday,Thursday,and Saturday with dialysis.     Marland Kitchen oxyCODONE-acetaminophen (PERCOCET) 5-325 MG tablet Take 1 tablet by mouth every 6 (six) hours as needed. 12 tablet 0  . polyethylene  glycol powder (GLYCOLAX/MIRALAX) 17 GM/SCOOP powder Take 17 g by mouth 2 (two) times daily as needed. 3350 g 1  . sucroferric oxyhydroxide (VELPHORO) 500 MG chewable tablet Chew 500 mg by mouth 2 (two) times daily with a meal.     . TRUEPLUS INSULIN SYRINGE 31G X 5/16" 1 ML MISC USE AS DIRECTED 100 each 5  . TRUEplus Lancets 28G MISC USE AS DIRECTED THREE TIMES DAILY TO TEST SUGAR BEFORE MEALS 100 each 12   Facility-Administered Medications Prior to Visit  Medication Dose Route Frequency Provider Last  Rate Last Admin  . 0.9 %  sodium chloride infusion   Intravenous Continuous Monia Sabal, PA-C        PAST MEDICAL HISTORY: Past Medical History:  Diagnosis Date  . Diabetes mellitus   . ESRD (end stage renal disease) ([place])    dialysis  . Hyperlipidemia   . Hypertension   . Iron deficiency anemia   . MRSA infection    hx of  . Neuropathy    Diabetic  . Pneumonia 07/2016  . Renal disorder   . Seizure-like activity ([place])   . Sepsis ([place])   . Thyroid disease   . Wears dentures     PAST SURGICAL HISTORY: Past Surgical History:  Procedure Laterality Date  . AMPUTATION Left 05/21/2015   Procedure: Left Foot 3rd Ray Amputation;  Surgeon: Newt Minion, MD;  Location: [facility];  Service: Orthopedics;  Laterality: Left;  . AV FISTULA PLACEMENT Left 03/28/2016   Procedure: ARTERIOVENOUS (AV) FISTULA CREATION LEFT ARM;  Surgeon: Waynetta Sandy, MD;  Location: [facility];  Service: Vascular;  Laterality: Left;  . AV FISTULA PLACEMENT Right 06/04/2019   Procedure: ARTERIOVENOUS (AV) FISTULA CREATION RIGHT ARM;  Surgeon: Waynetta Sandy, MD;  Location: [facility];  Service: Vascular;  Laterality: Right;  . DILATION AND CURETTAGE OF UTERUS    . EYE SURGERY    . FISTULA SUPERFICIALIZATION Left 09/04/2016   Procedure: FISTULA SUPERFICIALIZATION LEFT UPPER ARM;  Surgeon: Waynetta Sandy, MD;  Location: [facility];  Service: Vascular;  Laterality: Left;  . FISTULA SUPERFICIALIZATION Right 10/08/2019   Procedure: FISTULA SUPERFICIALIZATION OR RIGHT ARM;  Surgeon: Waynetta Sandy, MD;  Location: [facility];  Service: Vascular;  Laterality: Right;  . IR AV DIALY SHUNT INTRO NEEDLE/INTRAC INITIAL W/PTA/STENT/IMG LT Left 06/13/2017  . IR AV DIALY SHUNT INTRO NEEDLE/INTRAC INITIAL W/PTA/STENT/IMG LT Left 09/12/2017  . IR AV DIALY SHUNT INTRO NEEDLE/INTRACATH INITIAL W/PTA/IMG LEFT  11/10/2016  . IR AV DIALY SHUNT INTRO NEEDLE/INTRACATH INITIAL W/PTA/IMG LEFT  03/09/2017  . IR AV DIALY  SHUNT INTRO NEEDLE/INTRACATH INITIAL W/PTA/IMG LEFT  12/12/2017  . IR AV DIALY SHUNT INTRO NEEDLE/INTRACATH INITIAL W/PTA/IMG LEFT  02/27/2018  . IR AV DIALY SHUNT INTRO NEEDLE/INTRACATH INITIAL W/PTA/IMG LEFT  06/05/2018  . IR AV DIALY SHUNT INTRO NEEDLE/INTRACATH INITIAL W/PTA/IMG LEFT  09/11/2018  . IR DIALY SHUNT INTRO NEEDLE/INTRACATH INITIAL W/IMG LEFT Left 05/08/2018  . IR FLUORO GUIDE CV LINE RIGHT  02/01/2019  . IR GENERIC HISTORICAL  08/01/2016   IR FLUORO GUIDE CV LINE RIGHT 08/01/2016 [name], MD [facility]INTER[facility]  . IR GENERIC HISTORICAL  08/01/2016   IR US GUIDE VASC ACCESS RIGHT 08/01/2016 [name], MD [facility]  . IR REMOVAL TUN CV CATH W/O FL  11/01/2016  . IR THROMBECTOMY AV FISTULA W/THROMBOLYSIS INC/SHUNT/IMG LEFT Left 01/31/2019  . IR THROMBECTOMY AV FISTULA W/THROMBOLYSIS/PTA INC/SHUNT/IMG LEFT Left 01/27/2019  . IR US GUIDE VASC ACCESS LEFT  11/10/2016  .  IR US GUIDE VASC ACCESS LEFT  09/12/2017  . IR US GUIDE VASC ACCESS LEFT  12/12/2017  . IR US GUIDE VASC ACCESS LEFT  06/05/2018  . IR US GUIDE VASC ACCESS LEFT  09/11/2018  . IR US GUIDE VASC ACCESS LEFT  01/27/2019  . IR US GUIDE VASC ACCESS LEFT  02/01/2019  . IR US GUIDE VASC ACCESS RIGHT  02/01/2019    FAMILY HISTORY: History reviewed. No pertinent family history.  SOCIAL HISTORY: Social History   Socioeconomic History  . Marital status: Widowed    Spouse name: Not on file  . Number of children: 11  . Years of education: no schooling  . Highest education level: Not on file  Occupational History  . Not on file  Tobacco Use  . Smoking status: Never Smoker  . Smokeless tobacco: Never Used  Vaping Use  . Vaping Use: Never used  Substance and Sexual Activity  . Alcohol use: Not Currently    Alcohol/week: 0.0 standard drinks    Comment: rare- a beer every now and then  . Drug use: No  . Sexual activity: Not Currently  Other Topics Concern  . Not on file  Social History Narrative   Lives with son Marcial    Right  handed   Caffeine: every once in a while a little coffee, chamomile tea, or soda   Social Determinants of Health   Financial Resource Strain:   . Difficulty of Paying Living Expenses:   Food Insecurity:   . Worried About Charity fundraiser in the Last Year:   . Arboriculturist in the Last Year:   Transportation Needs:   . Film/video editor (Medical):   Marland Kitchen Lack of Transportation (Non-Medical):   Physical Activity:   . Days of Exercise per Week:   . Minutes of Exercise per Session:   Stress:   . Feeling of Stress :   Social Connections:   . Frequency of Communication with Friends and Family:   . Frequency of Social Gatherings with Friends and Family:   . Attends Religious Services:   . Active Member of Clubs or Organizations:   . Attends Archivist Meetings:   Marland Kitchen Marital Status:   Intimate Partner Violence:   . Fear of Current or Ex-Partner:   . Emotionally Abused:   Marland Kitchen Physically Abused:   . Sexually Abused:      PHYSICAL EXAM  GENERAL EXAM/CONSTITUTIONAL: Vitals:  Vitals:   12/26/19 0943  BP: (!) 99/52  Pulse: 75     There is no height or weight on file to calculate BMI. Wt Readings from Last 3 Encounters:  10/08/19 158 lb 11.7 oz (72 kg)  08/22/19 158 lb (71.7 kg)  08/13/19 158 lb (71.7 kg)     Patient is in no distress; well developed, nourished and groomed; neck is supple  CARDIOVASCULAR:  Examination of carotid arteries is normal; no carotid bruits  Regular rate and rhythm, no murmurs  Examination of peripheral vascular system by observation and palpation is normal  EYES:  Ophthalmoscopic exam of optic discs and posterior segments is normal; no papilledema or hemorrhages  No exam data present  MUSCULOSKELETAL:  Gait, strength, tone, movements noted in Neurologic exam below  NEUROLOGIC: MENTAL STATUS:  No flowsheet data found.  SLEEPY; FOLLOWS SIMPLE COMMANDS  DECR FLUENCY  CANNOT NAME HER MOTHER OR FATHERS NAME  KNOWS  WHERE SHE WAS BORN  CRANIAL NERVE:   2nd - no papilledema on fundoscopic  exam  2nd, 3rd, 4th, 6th - pupils equal and reactive to light, visual fields full to confrontation, extraocular muscles intact, no nystagmus  5th - facial sensation symmetric  7th - facial strength symmetric  8th - hearing intact  9th - palate elevates symmetrically, uvula midline  11th - shoulder shrug symmetric  12th - tongue protrusion midline  MOTOR:   normal bulk and tone; SLOW MOVEMENTS; BUE / BLE 3  SENSORY:   normal and symmetric to light touch  COORDINATION:   finger-nose-finger, fine finger movements normal  REFLEXES:   deep tendon reflexes TRACE and symmetric  GAIT/STATION:   IN WHEELCHAIR     DIAGNOSTIC DATA (LABS, IMAGING, TESTING) - I reviewed patient records, labs, notes, testing and imaging myself where available.  Lab Results  Component Value Date   WBC 5.1 07/11/2019   HGB 12.6 10/08/2019   HCT 37.0 10/08/2019   MCV 99.4 07/11/2019   PLT 204 07/11/2019      Component Value Date/Time   NA 140 10/08/2019 0722   K 3.8 10/08/2019 0722   CL 98 10/08/2019 0722   CO2 25 01/31/2019 1030   GLUCOSE 70 10/08/2019 0722   BUN 27 (H) 10/08/2019 0722   CREATININE 5.80 (H) 10/08/2019 0722   CREATININE 3.97 (H) 01/13/2016 1051   CALCIUM 9.0 01/31/2019 1030   PROT 6.5 02/11/2018 0940   ALBUMIN 2.5 (L) 02/12/2018 0418   AST 24 02/11/2018 0940   ALT 17 02/11/2018 0940   ALKPHOS 106 02/11/2018 0940   BILITOT 0.7 02/11/2018 0940   GFRNONAA 2 (L) 01/31/2019 1030   GFRNONAA 11 (L) 01/13/2016 1051   GFRAA 3 (L) 01/31/2019 1030   GFRAA 13 (L) 01/13/2016 1051   Lab Results  Component Value Date   CHOL 171 09/10/2018   HDL 44 09/10/2018   LDLCALC 82 09/10/2018   TRIG 223 (H) 09/10/2018   CHOLHDL 3.9 09/10/2018   Lab Results  Component Value Date   HGBA1C 8.7 (A) 08/13/2019   Lab Results  Component Value Date   DXIPJASN05 397 07/28/2016   Lab Results  Component  Value Date   TSH 2.316 06/14/2015    04/17/17 MRI brain - 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 No acute intracranial abnormality.     ASSESSMENT AND PLAN  69 y.o. year old female here with ESRD, memory loss, physical and cognitive decline, intermittent episodes of loss of consciousness, over past 3 to 6 months.  Dx:  1. Loss of consciousness (HCC)     PLAN:  LOC (? Syncope vs seizure; ? low sugar vs low BP; ESRD on dialysis) - check MRI brain - check EEG - monitor BP and sugar at home with events; try to capture with video  MEMORY LOSS / CONFUSION - suspected dementia; supportive care  Orders Placed This Encounter  Procedures  . MR BRAIN WO CONTRAST  . EEG adult   Return in about 9 months (around 09/24/2020), or or sooner pending test results.    Penni Bombard, MD 6/73/4193, 7:90 AM Certified in Neurology, Neurophysiology and Neuroimaging  [facility] [address] [place], [place] [address] [phone]

## 2019-12-26 NOTE — Patient Instructions (Signed)
-   check sugar and BP regularly (after attacks and routinely)  - check MRI brain and EEG

## 2019-12-26 NOTE — Telephone Encounter (Signed)
medicaid order sent to GI. They will reach out to the patient to schedule

## 2020-01-02 ENCOUNTER — Other Ambulatory Visit: Payer: Self-pay

## 2020-01-02 ENCOUNTER — Ambulatory Visit (HOSPITAL_COMMUNITY)
Admission: RE | Admit: 2020-01-02 | Discharge: 2020-01-02 | Disposition: A | Discharge status: Home / Self Care | Payer: Self-pay | Source: Ambulatory Visit | Attending: Nephrology | Admitting: Nephrology

## 2020-01-02 DIAGNOSIS — N186 End stage renal disease: Secondary | ICD-10-CM | POA: Insufficient documentation

## 2020-01-02 DIAGNOSIS — Z4901 Encounter for fitting and adjustment of extracorporeal dialysis catheter: Secondary | ICD-10-CM | POA: Insufficient documentation

## 2020-01-02 HISTORY — PX: IR REMOVAL TUN CV CATH W/O FL: IMG2289

## 2020-01-02 MED ORDER — LIDOCAINE HCL 1 % IJ SOLN
INTRAMUSCULAR | Status: AC
  Filled 2020-01-02: qty 20

## 2020-01-02 MED ORDER — LIDOCAINE HCL (PF) 1 % IJ SOLN
INTRAMUSCULAR | Status: DC | PRN
  Administered 2020-01-02: 10 mL

## 2020-01-02 MED ORDER — CHLORHEXIDINE GLUCONATE 4 % EX LIQD
CUTANEOUS | Status: AC
  Filled 2020-01-02: qty 15

## 2020-01-02 MED FILL — LANTUS 100 UNITS/ML VIAL: 100 | 28 days supply | Qty: 10 | Fill #3

## 2020-01-02 MED FILL — ATORVASTATIN CALCIUM 40 MG: 40 | 30 days supply | Qty: 30 | Fill #6

## 2020-01-02 MED FILL — TRUEPLUS SYR 1ML 31GX5/16: 31G X 5/16" | 30 days supply | Qty: 100 | Fill #4

## 2020-01-02 NOTE — Procedures (Signed)
Successful removal of right IJ tunneled HD catheter.   After obtaining consent and performing a time-out, the right upper chest was prepped and draped in the normal sterile fashion. The heparin was removed from both ports. 1% lidocaine was used for local anesthesia. Using gentle blunt dissection the cuff of the catheter was exposed and the catheter was removed in its entirety. Pressure was held until hemostasis was obtained. A sterile dressing was applied. The patient tolerated the procedure well with no immediate complications.   Soyla Dryer, [place] [phone] 01/02/2020, 3:26 PM

## 2020-01-14 MED FILL — DULoxetine HCL 60 MG CPEP: 60 | 30 days supply | Qty: 30 | Fill #0

## 2020-01-14 MED FILL — LISINOPRIL 20 MG TABLET: 20 | 30 days supply | Qty: 30 | Fill #5

## 2020-01-28 MED FILL — ATORVASTATIN CALCIUM 40 MG: 40 | 30 days supply | Qty: 30 | Fill #0

## 2020-01-28 MED FILL — LANTUS 100 UNITS/ML VIAL: 100 | 28 days supply | Qty: 10 | Fill #4

## 2020-01-28 MED FILL — ETHYL CHLORIDE AERO: 30 days supply | Qty: 116 | Fill #1

## 2020-01-28 MED FILL — GABAPENTIN 300 MG CAPSULE: 300 | 30 days supply | Qty: 30 | Fill #5

## 2020-02-11 MED FILL — LISINOPRIL 20 MG TABLET: 20 | 30 days supply | Qty: 30 | Fill #6

## 2020-02-11 MED FILL — DULoxetine HCL 60 MG CPEP: 60 | 30 days supply | Qty: 30 | Fill #1

## 2020-02-13 MED FILL — TRUE METRIX TEST STRIP: 33 days supply | Qty: 100 | Fill #1

## 2020-02-23 MED FILL — TRUEPLUS SYR 1ML 31GX5/16: 31G X 5/16" | 30 days supply | Qty: 100 | Fill #5

## 2020-03-04 MED FILL — ETHYL CHLORIDE AERO: 30 days supply | Qty: 116 | Fill #2

## 2020-03-04 MED FILL — LISINOPRIL 20 MG TABLET: 20 | 30 days supply | Qty: 30 | Fill #6

## 2020-03-05 MED FILL — !LANTUS 100 UNITS/ML VIAL: 100 | 28 days supply | Qty: 10 | Fill #5

## 2020-03-09 MED FILL — GABAPENTIN 300 MG CAPSULE: 300 | 30 days supply | Qty: 30 | Fill #6

## 2020-03-09 MED FILL — ATORVASTATIN CALCIUM 40 MG: 40 | 30 days supply | Qty: 30 | Fill #1

## 2020-03-22 ENCOUNTER — Other Ambulatory Visit: Payer: Self-pay | Admitting: Family Medicine

## 2020-03-22 DIAGNOSIS — I10 Essential (primary) hypertension: Secondary | ICD-10-CM

## 2020-03-22 MED FILL — DULoxetine HCL 60 MG CPEP: 60 | 30 days supply | Qty: 30 | Fill #2

## 2020-03-22 NOTE — Telephone Encounter (Signed)
Courtesy refill  

## 2020-04-02 ENCOUNTER — Other Ambulatory Visit: Payer: Self-pay | Admitting: Family Medicine

## 2020-04-02 DIAGNOSIS — Z794 Long term (current) use of insulin: Secondary | ICD-10-CM

## 2020-04-02 MED FILL — LANTUS 100 UNITS/ML VIAL: 100 | 28 days supply | Qty: 10 | Fill #6

## 2020-04-02 MED FILL — TRUEPLUS SYR 1ML 31GX5/16: 31G X 5/16" | 30 days supply | Qty: 100 | Fill #0

## 2020-04-02 NOTE — Telephone Encounter (Signed)
Requested Prescriptions  Pending Prescriptions Disp Refills  . TRUEPLUS INSULIN SYRINGE 31G X 5/16" 1 ML MISC [Pharmacy Med Name: TRUEPLUS SYR 1ML 31GX5/16" 31G X 5/16" Miscellaneous] 100 each 5    Sig: USE AS DIRECTED     There is no refill protocol information for this order

## 2020-04-06 MED FILL — LISINOPRIL 20 MG TABLET: 20 | 30 days supply | Qty: 30 | Fill #0

## 2020-04-12 ENCOUNTER — Other Ambulatory Visit: Payer: Self-pay | Admitting: Family Medicine

## 2020-04-12 DIAGNOSIS — E1149 Type 2 diabetes mellitus with other diabetic neurological complication: Secondary | ICD-10-CM

## 2020-04-12 MED FILL — ATORVASTATIN CALCIUM 40 MG: 40 | 30 days supply | Qty: 30 | Fill #2

## 2020-04-12 MED FILL — GABAPENTIN 300 MG CAPSULE: 300 | 30 days supply | Qty: 30 | Fill #0

## 2020-04-15 ENCOUNTER — Other Ambulatory Visit: Payer: Self-pay | Admitting: Podiatry

## 2020-04-15 ENCOUNTER — Encounter: Payer: Self-pay | Admitting: Podiatry

## 2020-04-15 ENCOUNTER — Ambulatory Visit (INDEPENDENT_AMBULATORY_CARE_PROVIDER_SITE_OTHER): Payer: Self-pay | Admitting: Podiatry

## 2020-04-15 ENCOUNTER — Other Ambulatory Visit: Payer: Self-pay

## 2020-04-15 ENCOUNTER — Ambulatory Visit (INDEPENDENT_AMBULATORY_CARE_PROVIDER_SITE_OTHER): Payer: Self-pay

## 2020-04-15 DIAGNOSIS — L89623 Pressure ulcer of left heel, stage 3: Secondary | ICD-10-CM

## 2020-04-15 DIAGNOSIS — N186 End stage renal disease: Secondary | ICD-10-CM

## 2020-04-15 DIAGNOSIS — E1142 Type 2 diabetes mellitus with diabetic polyneuropathy: Secondary | ICD-10-CM

## 2020-04-15 MED ORDER — MUPIROCIN 2 % EX OINT
1.0000 | TOPICAL_OINTMENT | Freq: Two times a day (BID) | CUTANEOUS | 2 refills | Status: DC
Start: 2020-04-15 — End: 2020-05-24

## 2020-04-15 MED FILL — MUPIROCIN 2% OINTMENT: 2 | 11 days supply | Qty: 22 | Fill #0

## 2020-04-15 NOTE — Progress Notes (Signed)
°  Subjective:  Patient ID: Sarah Kelley, female    DOB: March 08, 1951,  MRN: [id number]  Chief Complaint  Patient presents with   Wound Check    wound to the left heel for 2-3 weeks. PT stated that it is very painful and it looks a little better     69 y.o. female presents with the above complaint. History confirmed with patient.  Here today with her son.  She also has a Optometrist that translates from [place] to [place].  She does not know what her hemoglobin A1c is.  She is also a dialysis patient.  Objective:  Physical Exam: +2 DP and PT pulses, the skin is warm and has good capillary refill.  There is a pressure ulceration on the plantar lateral heel that extends to subcutaneous tissue.  No exposed bone or tendon or capsule.  No purulence.  No malodor.  No signs of acute infection  Radiographs: X-ray of the left foot: No evidence of osteomyelitis on plain, x-rays Assessment:   1. Pressure injury of left heel, stage 3 ([place])   2. Diabetic polyneuropathy associated with type 2 diabetes mellitus ([place])   3. ESRD (end stage renal disease) ([place])      Plan:  Patient was evaluated and treated and all questions answered.  Patient educated on diabetes. Discussed proper diabetic foot care and discussed risks and complications of disease. Educated patient in depth on reasons to return to the office immediately should he/she discover anything concerning or new on the feet.  I discussed with the patient and her family that she has a deep ulceration extending to the subcutaneous tissue in a difficult and dangerous for her to have an ulceration that puts her at high risk for limb loss.  Exacerbated by her uncontrolled diabetes and end-stage renal disease.  Emphasized importance of offloading with a pillow under the calf and dispensed them offloading heel protector boots.  No debridement was necessary today for the ulceration.  I believe we should continue local wound care and have  prescribed them mupirocin ointment apply this daily with dry sterile dressing.  Return in about 3 weeks (around 05/06/2020) for wound re-check.

## 2020-04-15 NOTE — Patient Instructions (Addendum)
lcera por presin Pressure Injury  Una lcera por presin es una lesin en la piel y el tejido subyacente que se produce debido a que se aplica presin en un rea del cuerpo. Generalmente afecta a las Advertising copywriter deben pasar mucho tiempo en la cama o en una silla debido a una afeccin mdica. Por lo general, las lceras por presin se producen en las siguientes reas:  [place] cuerpo, como el cccix, los hombros, los codos, las caderas, los talones, la [place], los tobillos y la parte posterior de la cabeza.  Debajo de los dispositivos mdicos que estn en contacto con el cuerpo, como los equipos para respirar, las medias de compresin, las sondas y las frulas. Las lceras por presin comienzan como reas enrojecidas en la piel y pueden causar dolor y Ardelia Mems herida [place]. Cules son las causas? La causa de esta afeccin es la aplicacin de presin frecuente o constante sobre un rea del cuerpo. El menor flujo sanguneo hacia la piel, en ltima instancia, puede hacer que el tejido cutneo se [place] y se degrade, con lo que se forma una herida. Qu incrementa el riesgo? Es ms probable que Ecologist las personas que:  Estn hospitalizadas o en un centro de cuidados prolongados.  Estn postradas en cama o en una silla de ruedas.  Tienen una lesin o una enfermedad que les impide: ? Que se muevan normalmente. ? Que sientan dolor o presin.  Tienen una afeccin con las siguientes caractersticas: ? Provoca sueo o disminucin del estado de [place]. ? Causa menor flujo sanguneo.  Hace que deban usar un dispositivo mdico.  Tienen un control deficiente de la vejiga o los intestinos (incontinencia).  Tienen una nutricin deficiente (desnutricin). Si usted tiene riesgo de sufrir lceras por presin, el mdico puede recomendarle ciertos tipos de colchones, cubrecolchones, almohadas, cojines o botas para ayudar a prevenirlas. Estos pueden incluir productos  llenos de aire, espuma, gel o arena. Cules son los signos o los sntomas? Los sntomas de esta afeccin dependen de la gravedad de la lesin. Entre los sntomas, se pueden incluir los siguientes:  reas rojas u oscuras en la piel.  Dolor, calor o cambio de textura en la piel.  Ampollas.  Herida abierta. Cmo se diagnostica? Esta afeccin se diagnostica mediante los antecedentes mdicos y un examen fsico. Tambin pueden hacerle estudios, por ejemplo:  Anlisis de [place].  Estudios de diagnstico por imgenes.  Pruebas del flujo sanguneo. La lcera por presin se clasificar en [place] en funcin de su gravedad. La clasificacin en [place]apas se basa en lo siguiente:  La profundidad de la lesin en el tejido, lo que incluye si hay exposicin de msculo, hueso o tendn.  La causa de la lcera por presin. Cmo se trata? El tratamiento para esta afeccin puede incluir lo siguiente:  Public house manager o redistribuir la presin sobre la piel. Esto puede comprender lo siguiente: ? Cambiar con frecuencia la posicin. ? Evitar las posiciones que causaron la herida o que pueden empeorar la herida. ? Utilizar botas protectoras, almohadones para sillas o colchones especficos. ? Mover los dispositivos mdicos del rea donde hacen presin, o colocar una proteccin acolchada entre la piel y el dispositivo. ? Utilizar espumas, cremas o talcos para prevenir el roce (la friccin) sobre la piel.  Mantener la piel limpia y seca. Esto puede incluir el uso de un agente de limpieza o una barrera para la piel como se lo haya indicado el mdico.  Limpiar la lesin y Radiographer, therapeutic  el tejido muerto de la herida (desbridamiento).  Colocar una venda (vendaje) sobre la lesin.  Usar medicamentos para Conservation officer, historic buildings o para prevenir o tratar una infeccin. Tal vez sea necesario realizar una ciruga si otros tratamientos no resultan eficaces o si la lesin es muy profunda. Siga estas indicaciones en su casa: Cuidados de la  herida  Siga las indicaciones del mdico acerca del cuidado de la herida. Asegrese de hacer lo siguiente: ? Lvese las manos con agua y [place] antes y despus de cambiar la venda (vendaje). Use desinfectante para manos si no dispone de [place] y [place]. ? Cambie el vendaje como se lo haya indicado el mdico.  Controle la herida todos los das para detectar signos de infeccin. Pdale a un cuidador que lo haga si no puede hacerlo usted mismo. Est atento a los siguientes signos: ? Enrojecimiento, hinchazn o aumento del dolor. ? Ms lquido Delorise Shiner. ? Calor. ? Pus o mal olor. [place] y [place]. Seque la piel con palmaditas suaves.  No frote ni se masajee la piel.  Usted o un cuidador debe revisar la piel todos los das para Hydrographic surveyor cualquier cambio de color o ampollas o llagas (lceras) nuevas. Medicamentos  [employer] de venta libre y los recetados solamente como se lo haya indicado el mdico.  Si le recetaron un antibitico, tmelo o aplqueselo como se lo haya indicado el mdico. No deje de usar el antibitico aunque la afeccin mejore. Reduccin y redistribucin de la presin  No permanezca sentado o recostado en una misma posicin durante largos perodos. Muvase o cambie de posicin cada 1 o 2 horas, o como se lo haya indicado el mdico.  Utilice almohadas o almohadones para reducir la presin. Pdale al mdico que le recomiende almohadones o almohadillas. Indicaciones generales   Consuma una dieta saludable que incluya muchas protenas.  Beba suficiente lquido como para Theatre manager la orina de color amarillo plido.  Haga la mayor cantidad de actividad posible Agricultural consultant. Pdale al [employer] le sugiera actividades o ejercicios que sean seguros para usted.  No consuma alcohol ni drogas en forma excesiva.  No consuma ningn producto que contenga nicotina o tabaco, como cigarrillos, cigarrillos electrnicos y tabaco de Higher education careers adviser. Si  necesita ayuda para dejar de fumar, consulte al mdico.  Concurra a todas las visitas de seguimiento como se lo haya indicado el mdico. Esto es importante. Comunquese con un mdico si:  Tiene lo siguiente: ? Fiebre o escalofros. ? Dolor que los medicamentos no [place]. ? Cualquier cambio en el color de la piel. ? Ampollas o llagas nuevas. ? Pus o mal olor proveniente de la herida. ? Enrojecimiento, hinchazn o dolor alrededor de la herida. ? Ms lquido o sangre proveniente de la herida.  La herida no mejora despus de 1 o 2 semanas de tratamiento. Resumen  Una lcera por presin es una lesin en la piel y el tejido subyacente que se produce debido a que se aplica presin en un rea del cuerpo.  No permanezca sentado o recostado en una misma posicin durante largos perodos. El mdico puede aconsejarle que se mueva o cambie de posicin cada 1 o 2 horas.  Siga las indicaciones del mdico acerca del cuidado de la herida.  Concurra a todas las visitas de seguimiento como se lo haya indicado el mdico. Esto es importante. Esta informacin no tiene [name] el consejo del mdico. Asegrese de hacerle al mdico cualquier pregunta  que tenga. Document Revised: 02/21/2018 Document Reviewed: 02/21/2018 Elsevier Patient Education  2020 [place] lceras por presin Preventing Pressure Injuries  Una lcera por presin, a veces llamada escara, es una lesin en la piel y el tejido subyacente causada por presin. Puede producirse cuando la piel ejerce presin durante mucho tiempo contra una superficie, como un colchn o el asiento de una silla de ruedas. La presin en los vasos sanguneos disminuye la irrigacin de sangre a la piel. A la larga, esto puede hacer que el tejido de la piel [place] y se rompa, lo que causa una herida. Las lceras por presin suelen aparecer en los siguientes lugares:  Sobre las partes seas del cuerpo, como el coxis, los hombros, los  codos, las caderas y los talones.  Debajo de los dispositivos mdicos, como los equipos para Ambulance person, las medias de compresin, las sondas y las frulas. Cmo puede afectarme esta enfermedad? Las lceras por presin se deben a la falta de irrigacin sangunea hacia una zona de la piel. Estas lceras comienzan como una zona enrojecida en la piel y pueden convertirse en una llaga abierta. Pueden ser el resultado de una presin intensa durante un perodo breve o de una presin menor durante un perodo prolongado. Su gravedad puede variar. Pueden causar dolor, dao muscular e infecciones. [place]? Es ms probable que Orthoptist en las personas que presentan alguna de estas caractersticas:  Estn hospitalizadas o en un centro de cuidados prolongados.  Estn postradas en cama o en una silla de ruedas.  Tienen una lesin o una enfermedad que evita lo siguiente: ? Que se muevan normalmente. ? Que sientan dolor o presin. ? Que informen si sienten dolor o presin.  Tienen una afeccin con las siguientes caractersticas: ? Provoca sueo o disminucin del estado de [place]. ? Causa menor flujo sanguneo.  Hace que deban usar un dispositivo mdico.  Tienen un control deficiente de la vejiga o los intestinos (incontinencia).  Tienen una nutricin deficiente (desnutricin).  Tuvieron esta afeccin anteriormente.  Pertenecen a ciertos grupos tnicos. Las Engineer, manufacturing de origen afroamericano, latino o hispano corren ms riesgo en comparacin con otros grupos tnicos. Qu medidas debo tomar para evitar las lceras por presin? Reduccin y redistribucin de la presin  No permanezca sentado o recostado en una misma posicin durante largos perodos. Muvase o cambie de posicin: ? Cada hora cuando est fuera de la cama en una silla. ? Cada dos horas cuando est en la cama. ? Con la frecuencia que le haya indicado el mdico.  Utilice almohadas, almohadas  triangulares o almohadones para redistribuir la presin. Pdale al mdico que le recomiende un colchn, almohadones o almohadillas.  Utilice dispositivos mdicos que no friccionen la piel. Informe al mdico si uno de los dispositivos mdicos le est causando dolor o irritacin. Cuidado de la piel Si se [facility], los mdicos harn lo siguiente:  Le revisarn la piel, incluidas las zonas debajo de dispositivos mdicos o Magazine features editor, al [employer] veces al da.  Pueden recomendarle que use determinados tipos de ropa de cama para ayudar a evitar las lceras por presin. Estos pueden incluir una almohadilla, un colchn o un almohadn para sillas que estn rellenos con gel, aire, agua o una espuma.  Evaluarn su alimentacin y consultarn a un nutricionista, si es necesario.  Revisarn y [employer] vendajes de las heridas habitualmente.  Pueden ayudarlo a cambiar de posicin cada pocas horas.  Ajustarn los  dispositivos mdicos y los ortopdicos segn sea necesario para Public house manager la presin [employer].  Le mantendrn la piel limpia y seca.  Pueden usar soluciones de limpieza y protectores para la piel suaves, si tiene incontinencia.  Le hidratarn la piel seca. En general, en el hogar:  Mantenga la piel limpia y seca. Seque la piel con palmaditas suaves.  No frote ni masajee las zonas seas de la piel.  Hidrate la piel seca.  Use soluciones de limpieza y protectores para la piel suaves, si tiene incontinencia.  Contrlese la piel al menos una vez al da para detectar cualquier cambio de color o ampollas o llagas nuevas. Revise debajo y alrededor de los dispositivos mdicos, y [employer] pliegues de la piel. Pdale a un cuidador que lo haga si no puede hacerlo usted mismo.  Estilo de [place] cantidad de actividad posible Agricultural consultant. Pdale al [employer] le sugiera actividades o ejercicios que sean seguros para usted.  No consuma alcohol ni drogas en  forma excesiva.  No consuma ningn producto que contenga nicotina o tabaco, como cigarrillos, cigarrillos electrnicos y tabaco de Higher education careers adviser. Si necesita ayuda para dejar de fumar, consulte al [employer]. Instrucciones generales   [employer] de venta libre y los recetados solamente como se lo haya indicado el mdico.  Trabaje con el mdico para Theatre manager cualquier afeccin crnica bajo control.  Siga una dieta saludable que incluya protenas, vitaminas y minerales. Pregntele al mdico qu tipo de alimentos debe consumir.  Beba suficiente lquido como para Theatre manager la orina de color amarillo plido.  Concurra a todas las visitas de seguimiento como se lo haya indicado el mdico. Esto es importante. Comunquese con un mdico si:  Siente u observa cualquier cambio en la piel. Resumen  Una lcera por presin, a veces llamada escara, es una lesin en la piel y el tejido subyacente causada por presin.  No permanezca sentado o recostado en una misma posicin durante largos perodos.  Contrlese la piel al menos una vez al da para detectar cualquier cambio de color o ampollas o llagas nuevas.  Revise debajo y alrededor de los dispositivos mdicos, y [employer] pliegues de la piel. Pdale a un cuidador que lo haga si no puede hacerlo usted mismo.  Siga una dieta saludable que incluya protenas, vitaminas y minerales. Pregntele al mdico qu tipo de alimentos debe consumir. Esta informacin no tiene [name] el consejo del mdico. Asegrese de hacerle al mdico cualquier pregunta que tenga. Document Revised: 04/19/2018 Document Reviewed: 04/19/2018 Elsevier Patient Education  2020 [name].

## 2020-04-16 ENCOUNTER — Other Ambulatory Visit (HOSPITAL_COMMUNITY): Payer: Self-pay | Admitting: Nephrology

## 2020-04-16 DIAGNOSIS — N186 End stage renal disease: Secondary | ICD-10-CM

## 2020-04-20 ENCOUNTER — Other Ambulatory Visit: Payer: Self-pay | Admitting: Student

## 2020-04-21 ENCOUNTER — Ambulatory Visit (HOSPITAL_COMMUNITY)
Admission: RE | Admit: 2020-04-21 | Discharge: 2020-04-21 | Disposition: A | Discharge status: Home / Self Care | Payer: Self-pay | Source: Ambulatory Visit | Attending: Nephrology | Admitting: Nephrology

## 2020-04-21 ENCOUNTER — Other Ambulatory Visit: Payer: Self-pay

## 2020-04-21 ENCOUNTER — Other Ambulatory Visit (HOSPITAL_COMMUNITY): Payer: Self-pay | Admitting: Nephrology

## 2020-04-21 DIAGNOSIS — N186 End stage renal disease: Secondary | ICD-10-CM

## 2020-04-21 DIAGNOSIS — Z8614 Personal history of Methicillin resistant Staphylococcus aureus infection: Secondary | ICD-10-CM | POA: Insufficient documentation

## 2020-04-21 DIAGNOSIS — I120 Hypertensive chronic kidney disease with stage 5 chronic kidney disease or end stage renal disease: Secondary | ICD-10-CM | POA: Insufficient documentation

## 2020-04-21 DIAGNOSIS — E1140 Type 2 diabetes mellitus with diabetic neuropathy, unspecified: Secondary | ICD-10-CM | POA: Insufficient documentation

## 2020-04-21 DIAGNOSIS — Z79899 Other long term (current) drug therapy: Secondary | ICD-10-CM | POA: Insufficient documentation

## 2020-04-21 DIAGNOSIS — E785 Hyperlipidemia, unspecified: Secondary | ICD-10-CM | POA: Insufficient documentation

## 2020-04-21 DIAGNOSIS — Z992 Dependence on renal dialysis: Secondary | ICD-10-CM | POA: Insufficient documentation

## 2020-04-21 DIAGNOSIS — E079 Disorder of thyroid, unspecified: Secondary | ICD-10-CM | POA: Insufficient documentation

## 2020-04-21 DIAGNOSIS — Z794 Long term (current) use of insulin: Secondary | ICD-10-CM | POA: Insufficient documentation

## 2020-04-21 DIAGNOSIS — Y832 Surgical operation with anastomosis, bypass or graft as the cause of abnormal reaction of the patient, or of later complication, without mention of misadventure at the time of the procedure: Secondary | ICD-10-CM | POA: Insufficient documentation

## 2020-04-21 DIAGNOSIS — Z89422 Acquired absence of other left toe(s): Secondary | ICD-10-CM | POA: Insufficient documentation

## 2020-04-21 DIAGNOSIS — T82858A Stenosis of vascular prosthetic devices, implants and grafts, initial encounter: Secondary | ICD-10-CM | POA: Insufficient documentation

## 2020-04-21 DIAGNOSIS — E1122 Type 2 diabetes mellitus with diabetic chronic kidney disease: Secondary | ICD-10-CM | POA: Insufficient documentation

## 2020-04-21 DIAGNOSIS — R569 Unspecified convulsions: Secondary | ICD-10-CM | POA: Insufficient documentation

## 2020-04-21 HISTORY — PX: IR AV DIALY SHUNT INTRO NEEDLE/INTRACATH INITIAL W/PTA/IMG RIGHT: IMG6116

## 2020-04-21 LAB — GLUCOSE, CAPILLARY
Glucose-Capillary: 41 mg/dL — CL (ref 70–99)
Glucose-Capillary: 105 mg/dL — ABNORMAL HIGH (ref 70–99)
Glucose-Capillary: 63 mg/dL — ABNORMAL LOW (ref 70–99)
Glucose-Capillary: 78 mg/dL (ref 70–99)

## 2020-04-21 LAB — BASIC METABOLIC PANEL
Anion gap: 14 (ref 5–15)
BUN: 36 mg/dL — ABNORMAL HIGH (ref 8–23)
CO2: 27 mmol/L (ref 22–32)
Calcium: 9.8 mg/dL (ref 8.9–10.3)
Chloride: 92 mmol/L — ABNORMAL LOW (ref 98–111)
Creatinine, Ser: 6.89 mg/dL — ABNORMAL HIGH (ref 0.44–1.00)
GFR, Estimated: 5 mL/min — ABNORMAL LOW (ref >60)
Glucose, Bld: 47 mg/dL — ABNORMAL LOW (ref 70–99)
Potassium: 5.6 mmol/L — ABNORMAL HIGH (ref 3.5–5.1)
Sodium: 133 mmol/L — ABNORMAL LOW (ref 135–145)

## 2020-04-21 MED ORDER — FENTANYL CITRATE (PF) 100 MCG/2ML IJ SOLN
INTRAMUSCULAR | Status: AC | PRN
  Administered 2020-04-21: 25 ug via INTRAVENOUS

## 2020-04-21 MED ORDER — DEXTROSE 50 % IV SOLN: 25.0000 mL | Freq: Once | INTRAVENOUS | Status: AC

## 2020-04-21 MED ORDER — LIDOCAINE-EPINEPHRINE 1 %-1:100000 IJ SOLN
INTRAMUSCULAR | Status: AC
  Filled 2020-04-21: qty 1

## 2020-04-21 MED ORDER — IOHEXOL 300 MG/ML  SOLN
100.0000 mL | Freq: Once | INTRAMUSCULAR | Status: AC | PRN
  Administered 2020-04-21: 20 mL

## 2020-04-21 MED ORDER — DEXTROSE 50 % IV SOLN
INTRAVENOUS | Status: AC
  Administered 2020-04-21: 25 mL via INTRAVENOUS
  Filled 2020-04-21: qty 50

## 2020-04-21 MED ORDER — MIDAZOLAM HCL 2 MG/2ML IJ SOLN
INTRAMUSCULAR | Status: AC | PRN
  Administered 2020-04-21: 1 mg via INTRAVENOUS

## 2020-04-21 MED ORDER — LIDOCAINE HCL 1 % IJ SOLN
INTRAMUSCULAR | Status: AC | PRN
  Administered 2020-04-21: 30 mL via INTRADERMAL

## 2020-04-21 MED ORDER — HEPARIN SODIUM (PORCINE) 1000 UNIT/ML IJ SOLN
INTRAMUSCULAR | Status: AC
  Filled 2020-04-21: qty 1

## 2020-04-21 MED ORDER — LIDOCAINE HCL (PF) 1 % IJ SOLN
INTRAMUSCULAR | Status: AC
  Filled 2020-04-21: qty 30

## 2020-04-21 MED ORDER — SODIUM CHLORIDE 0.9 % IV SOLN: INTRAVENOUS | Status: DC

## 2020-04-21 MED ORDER — MIDAZOLAM HCL 2 MG/2ML IJ SOLN
INTRAMUSCULAR | Status: AC
  Filled 2020-04-21: qty 2

## 2020-04-21 MED ORDER — FENTANYL CITRATE (PF) 100 MCG/2ML IJ SOLN
INTRAMUSCULAR | Status: AC
  Filled 2020-04-21: qty 2

## 2020-04-21 NOTE — Procedures (Signed)
Interventional Radiology Procedure Note  Procedure: Right upper extremity fistulagram  Findings: Please refer to procedural dictation for full description. 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  Balloon angioplasty to 6 mm with improved patency and flow.  Small caliber but patent and brisk flowing brachicephalic anastomosis.  Patent central veins.  Complications: None immediate  Estimated Blood Loss: <5 mL  Recommendations: Fistula OK for use as needed. Suture to be removed at next hemodialysis session.   Ruthann Cancer, MD Pager: [phone]

## 2020-04-21 NOTE — Progress Notes (Signed)
Stitch string in place, tegaderm intact, no bleeding noted

## 2020-04-21 NOTE — Discharge Instructions (Signed)
Moderate Conscious Sedation, Adult, Care After These instructions provide you with information about caring for yourself after your procedure. Your health care provider may also give you more specific instructions. Your treatment has been planned according to current medical practices, but problems sometimes occur. Call your health care provider if you have any problems or questions after your procedure. What can I expect after the procedure? After your procedure, it is common:  To feel sleepy for several hours.  To feel clumsy and have poor balance for several hours.  To have poor judgment for several hours.  To vomit if you eat too soon. Follow these instructions at home: For at least 24 hours after the procedure:   Do not: ? Participate in activities where you could fall or become injured. ? Drive. ? Use heavy machinery. ? Drink alcohol. ? Take sleeping pills or medicines that cause drowsiness. ? Make important decisions or sign legal documents. ? Take care of children on your own.  Rest. Eating and drinking  Follow the diet recommended by your health care provider.  If you vomit: ? Drink water, juice, or soup when you can drink without vomiting. ? Make sure you have little or no nausea before eating solid foods. General instructions  Have a responsible adult stay with you until you are awake and alert.  Take over-the-counter and prescription medicines only as told by your health care provider.  If you smoke, do not smoke without supervision.  Keep all follow-up visits as told by your health care provider. This is important. Contact a health care provider if:  You keep feeling nauseous or you keep vomiting.  You feel light-headed.  You develop a rash.  You have a fever. Get help right away if:  You have trouble breathing. This information is not intended to replace advice given to you by your health care provider. Make sure you discuss any questions you have  with your health care provider. Document Revised: 06/01/2017 Document Reviewed: 10/09/2015 Elsevier Patient Education  2020 Elsevier Inc. Dialysis Fistulogram, Care After This sheet gives you information about how to care for yourself after your procedure. Your health care provider may also give you more specific instructions. If you have problems or questions, contact your health care provider. What can I expect after the procedure? After the procedure, it is common to have:  A small amount of discomfort in the area where the small, thin tube (catheter) was placed for the procedure.  A small amount of bruising around the fistula.  Sleepiness and tiredness (fatigue). Follow these instructions at home: Activity   Rest at home and do not lift anything that is heavier than 5 lb (2.3 kg) on the day after your procedure.  Return to your normal activities as told by your health care provider. Ask your health care provider what activities are safe for you.  Do not drive or use heavy machinery while taking prescription pain medicine.  Do not drive for 24 hours if you were given a medicine to help you relax (sedative) during your procedure. Medicines   Take over-the-counter and prescription medicines only as told by your health care provider. Puncture site care  Follow instructions from your health care provider about how to take care of the site where catheters were inserted. Make sure you: ? Wash your hands with soap and water before you change your bandage (dressing). If soap and water are not available, use hand sanitizer. ? Change your dressing as told by your health   care provider. ? Leave stitches (sutures), skin glue, or adhesive strips in place. These skin closures may need to stay in place for 2 weeks or longer. If adhesive strip edges start to loosen and curl up, you may trim the loose edges. Do not remove adhesive strips completely unless your health care provider tells you to do  that.  Check your puncture area every day for signs of infection. Check for: ? Redness, swelling, or pain. ? Fluid or blood. ? Warmth. ? Pus or a bad smell. General instructions  Do not take baths, swim, or use a hot tub until your health care provider approves. Ask your health care provider if you may take showers. You may only be allowed to take sponge baths.  Monitor your dialysis fistula closely. Check to make sure that you can feel a vibration or buzz (a thrill) when you put your fingers over the fistula.  Prevent damage to your graft or fistula: ? Do not wear tight-fitting clothing or jewelry on the arm or leg that has your graft or fistula. ? Tell all your health care providers that you have a dialysis fistula or graft. ? Do not allow blood draws, IVs, or blood pressure readings to be done in the arm that has your fistula or graft. ? Do not allow flu shots or vaccinations in the arm with your fistula or graft.  Keep all follow-up visits as told by your health care provider. This is important. Contact a health care provider if:  You have redness, swelling, or pain at the site where the catheter was put in.  You have fluid or blood coming from the catheter site.  The catheter site feels warm to the touch.  You have pus or a bad smell coming from the catheter site.  You have a fever or chills. Get help right away if:  You feel weak.  You have trouble balancing.  You have trouble moving your arms or legs.  You have problems with your speech or vision.  You can no longer feel a vibration or buzz when you put your fingers over your dialysis fistula.  The limb that was used for the procedure: ? Swells. ? Is painful. ? Is cold. ? Is discolored, such as blue or pale white.  You have chest pain or shortness of breath. Summary  After a dialysis fistulogram, it is common to have a small amount of discomfort or bruising in the area where the small, thin tube (catheter)  was placed.  Rest at home on the day after your procedure. Return to your normal activities as told by your health care provider.  Take over-the-counter and prescription medicines only as told by your health care provider.  Follow instructions from your health care provider about how to take care of the site where the catheter was inserted.  Keep all follow-up visits as told by your health care provider. This information is not intended to replace advice given to you by your health care provider. Make sure you discuss any questions you have with your health care provider. Document Revised: 07/20/2017 Document Reviewed: 07/20/2017 Elsevier Patient Education  2020 Elsevier Inc.  

## 2020-04-21 NOTE — H&P (Signed)
Chief Complaint: Patient was seen in consultation today for ESRD/fistulagram with possible intervention.  Referring Physician(s): Upton,Elizabeth  Supervising Physician: Suttle,Dylan  Patient Status: [facility] - Out-pt  History of Present Illness: Sarah Kelley is a 69 y.o. female with a past medical history of hypertension, hyperlipidemia, ESRD on HD, thyroid disease, diabetes mellitus with associated peripheral neuropathy, and iron deficiency anemia. She undergoes HD via RUE AVF, and typically goes on Tuesdays, Thursdays, and Saturdays. Per son, patient has had "difficulty with flow" during dialysis since last Thursday. In addition, when accessed yesterday, they "pulled back coagulated blood".  IR consulted by Dr. Hollie Salk for possible image-guided fistulagram with possible intervention. Patient awake and alert laying in bed. Son and interpretor at bedside. Complains of RUE pain (lateral elbow)- per son has been present since fistula creation. No additional complaints. Denies fever, chills, chest pain, dyspnea, abdominal pain, or headache.   Past Medical History:  Diagnosis Date  . Diabetes mellitus   . ESRD (end stage renal disease) ([place])    dialysis  . Hyperlipidemia   . Hypertension   . Iron deficiency anemia   . MRSA infection    hx of  . Neuropathy    Diabetic  . Pneumonia 07/2016  . Renal disorder   . Seizure-like activity ([place])   . Sepsis ([place])   . Thyroid disease   . Wears dentures     Past Surgical History:  Procedure Laterality Date  . AMPUTATION Left 05/21/2015   Procedure: Left Foot 3rd Ray Amputation;  Surgeon: Newt Minion, MD;  Location: [place];  Service: Orthopedics;  Laterality: Left;  . AV FISTULA PLACEMENT Left 03/28/2016   Procedure: ARTERIOVENOUS (AV) FISTULA CREATION LEFT ARM;  Surgeon: Waynetta Sandy, MD;  Location: [place];  Service: Vascular;  Laterality: Left;  . AV FISTULA PLACEMENT Right 06/04/2019   Procedure: ARTERIOVENOUS  (AV) FISTULA CREATION RIGHT ARM;  Surgeon: Waynetta Sandy, MD;  Location: [place];  Service: Vascular;  Laterality: Right;  . DILATION AND CURETTAGE OF UTERUS    . EYE SURGERY    . FISTULA SUPERFICIALIZATION Left 09/04/2016   Procedure: FISTULA SUPERFICIALIZATION LEFT UPPER ARM;  Surgeon: Waynetta Sandy, MD;  Location: [place];  Service: Vascular;  Laterality: Left;  . FISTULA SUPERFICIALIZATION Right 10/08/2019   Procedure: FISTULA SUPERFICIALIZATION OR RIGHT ARM;  Surgeon: Waynetta Sandy, MD;  Location: [place];  Service: Vascular;  Laterality: Right;  . IR AV DIALY SHUNT INTRO NEEDLE/INTRAC INITIAL W/PTA/STENT/IMG LT Left 06/13/2017  . IR AV DIALY SHUNT INTRO NEEDLE/INTRAC INITIAL W/PTA/STENT/IMG LT Left 09/12/2017  . IR AV DIALY SHUNT INTRO NEEDLE/INTRACATH INITIAL W/PTA/IMG LEFT  11/10/2016  . IR AV DIALY SHUNT INTRO NEEDLE/INTRACATH INITIAL W/PTA/IMG LEFT  03/09/2017  . IR AV DIALY SHUNT INTRO NEEDLE/INTRACATH INITIAL W/PTA/IMG LEFT  12/12/2017  . IR AV DIALY SHUNT INTRO NEEDLE/INTRACATH INITIAL W/PTA/IMG LEFT  02/27/2018  . IR AV DIALY SHUNT INTRO NEEDLE/INTRACATH INITIAL W/PTA/IMG LEFT  06/05/2018  . IR AV DIALY SHUNT INTRO NEEDLE/INTRACATH INITIAL W/PTA/IMG LEFT  09/11/2018  . IR DIALY SHUNT INTRO NEEDLE/INTRACATH INITIAL W/IMG LEFT Left 05/08/2018  . IR FLUORO GUIDE CV LINE RIGHT  02/01/2019  . IR GENERIC HISTORICAL  08/01/2016   IR FLUORO GUIDE CV LINE RIGHT 08/01/2016 [name], MD [facility]-INTER[facility]  . IR GENERIC HISTORICAL  08/01/2016   IR US GUIDE VASC ACCESS RIGHT 08/01/2016 [name], MD [facility]  . IR REMOVAL TUN CV CATH W/O FL  11/01/2016  . IR REMOVAL TUN  CV CATH W/O FL  01/02/2020  . IR THROMBECTOMY AV FISTULA W/THROMBOLYSIS INC/SHUNT/IMG LEFT Left 01/31/2019  . IR THROMBECTOMY AV FISTULA W/THROMBOLYSIS/PTA INC/SHUNT/IMG LEFT Left 01/27/2019  . IR US GUIDE VASC ACCESS LEFT  11/10/2016  . IR US GUIDE VASC ACCESS LEFT  09/12/2017  . IR US GUIDE VASC ACCESS LEFT   12/12/2017  . IR US GUIDE VASC ACCESS LEFT  06/05/2018  . IR US GUIDE VASC ACCESS LEFT  09/11/2018  . IR US GUIDE VASC ACCESS LEFT  01/27/2019  . IR US GUIDE VASC ACCESS LEFT  02/01/2019  . IR US GUIDE VASC ACCESS RIGHT  02/01/2019    Allergies: Patient has no known allergies.  Medications: Prior to Admission medications   Medication Sig Start Date End Date Taking? Authorizing Provider  atorvastatin (LIPITOR) 40 MG tablet Take 1 tablet (40 mg total) by mouth daily. 08/13/19  Yes [name], MD  Blood Glucose Monitoring Suppl (TRUE METRIX METER) DEVI 1 each by Does not apply route 3 (three) times daily before meals. 07/25/17  Yes [name], MD  cetirizine (ZYRTEC) 10 MG tablet Take 1 tablet (10 mg total) by mouth daily. 05/02/19  Yes Ladell Pier, MD  DULoxetine (CYMBALTA) 60 MG capsule Take 1 capsule (60 mg total) by mouth daily. Patient taking differently: Take 60 mg by mouth every evening.  08/13/19  Yes Newlin, [name], MD  ethyl chloride spray SPRAY 1 SPRAY TO THE SKIN 3 TIMES A WEEK AS NEEDED. SPRAY A SMALL AMOUNT JUST PRIOR TO NEEDLE INSERTION. 12/24/19  Yes Newlin, Charlane Ferretti, MD  gabapentin (NEURONTIN) 300 MG capsule TAKE 1 CAPSULE (300 MG TOTAL) BY MOUTH AT BEDTIME. 04/12/20  Yes Newlin, [name], MD  glucose blood (TRUE METRIX BLOOD GLUCOSE TEST) test strip Use 3 times daily before meals 10/17/19  Yes Newlin, [name], MD  insulin glargine (LANTUS) 100 UNIT/ML injection Inject subcutaneously twice daily 20 units in the morning and 15 units in the evening Patient taking differently: Inject 14-18 Units into the skin See admin instructions. Inject 18 units in the morning and 14 units in the evening 05/13/19  Yes Newlin, [name], MD  lisinopril (ZESTRIL) 20 MG tablet TAKE 1 TABLET (20 MG TOTAL) BY MOUTH DAILY. 03/22/20  Yes [name], MD  mupirocin ointment (BACTROBAN) 2 % Apply 1 application topically 2 (two) times daily. 04/15/20  Yes McDonald, Stephan Minister, DPM  Nutritional  Supplements (FEEDING SUPPLEMENT, NEPRO CARB STEADY,) LIQD Take 237 mLs by mouth Every Tuesday,Thursday,and Saturday with dialysis.    Yes [provider]  SENSIPAR 30 MG tablet SMARTSIG:1 Tablet(s) By Mouth Every Evening 11/04/19  Yes [provider]  sucroferric oxyhydroxide (VELPHORO) 500 MG chewable tablet Chew 500 mg by mouth 2 (two) times daily with a meal.    Yes [provider]  TRUEPLUS INSULIN SYRINGE 31G X 5/16" 1 ML MISC USE AS DIRECTED 04/02/20  Yes Newlin, [name], MD  TRUEplus Lancets 28G MISC USE AS DIRECTED THREE TIMES DAILY TO TEST SUGAR BEFORE MEALS 02/11/19  Yes Newlin, [name], MD  Amino Acids-Protein Hydrolys (FEEDING SUPPLEMENT, PRO-STAT SUGAR FREE 64,) LIQD Take 30 mLs by mouth 2 (two) times daily. 02/17/18   Elgergawy, Silver Huguenin, MD  cinacalcet (SENSIPAR) 60 MG tablet Take by mouth. 12/11/19   [provider]  Methoxy PEG-Epoetin Beta (MIRCERA IJ) Mircera 01/13/20 01/11/21  [provider]  oxyCODONE-acetaminophen (PERCOCET) 5-325 MG tablet Take 1 tablet by mouth every 6 (six) hours as needed. 10/08/19   Rhyne, Hulen Shouts, PA-C  polyethylene  glycol powder (GLYCOLAX/MIRALAX) 17 GM/SCOOP powder Take 17 g by mouth 2 (two) times daily as needed. 05/13/19   [name], MD     No family history on file.  Social History   Socioeconomic History  . Marital status: Widowed    Spouse name: Not on file  . Number of children: 11  . Years of education: no schooling  . Highest education level: Not on file  Occupational History  . Not on file  Tobacco Use  . Smoking status: Never Smoker  . Smokeless tobacco: Never Used  Vaping Use  . Vaping Use: Never used  Substance and Sexual Activity  . Alcohol use: Not Currently    Alcohol/week: 0.0 standard drinks    Comment: rare- a beer every now and then  . Drug use: No  . Sexual activity: Not Currently  Other Topics Concern  . Not on file  Social History Narrative   Lives with son Marcial      Right handed   Caffeine: every once in a while a little coffee, chamomile tea, or soda   Social Determinants of Health   Financial Resource Strain:   . Difficulty of Paying Living Expenses: Not on file  Food Insecurity:   . Worried About Charity fundraiser in the Last Year: Not on file  . Ran Out of Food in the Last Year: Not on file  Transportation Needs:   . Lack of Transportation (Medical): Not on file  . Lack of Transportation (Non-Medical): Not on file  Physical Activity:   . Days of Exercise per Week: Not on file  . Minutes of Exercise per Session: Not on file  Stress:   . Feeling of Stress : Not on file  Social Connections:   . Frequency of Communication with Friends and Family: Not on file  . Frequency of Social Gatherings with Friends and Family: Not on file  . Attends Religious Services: Not on file  . Active Member of Clubs or Organizations: Not on file  . Attends Archivist Meetings: Not on file  . Marital Status: Not on file     Review of Systems: A 12 point ROS discussed and pertinent positives are indicated in the HPI above.  All other systems are negative.  Review of Systems  Constitutional: Negative for chills and fever.  Respiratory: Negative for shortness of breath and wheezing.   Cardiovascular: Negative for chest pain and palpitations.  Gastrointestinal: Negative for abdominal pain.  Musculoskeletal:       Positive for RUE lateral elbow pain.  Neurological: Negative for headaches.  Psychiatric/Behavioral: Negative for behavioral problems and confusion.    Vital Signs: BP (!) 131/47   Pulse 68   Temp 98.1 F (36.7 C) (Oral)   Resp 16   LMP  (LMP Unknown)   SpO2 100%   Physical Exam Vitals and nursing note reviewed.  Constitutional:      General: She is not in acute distress.    Appearance: Normal appearance.  Cardiovascular:     Rate and Rhythm: Normal rate and regular rhythm.     Heart sounds: Normal heart sounds. No murmur  heard.   Pulmonary:     Effort: Pulmonary effort is normal. No respiratory distress.     Breath sounds: Normal breath sounds. No wheezing.  Skin:    General: Skin is warm and dry.     Comments: RUE fistula with mild ecchymosis, no tenderness, erythema, drainage, or active bleeding; thrill palpable; bruit heard  on ascultation.  Neurological:     Mental Status: She is alert and oriented to person, place, and time.      MD Evaluation Airway: WNL Heart: WNL Abdomen: WNL Chest/ Lungs: WNL ASA  Classification: 3 Mallampati/Airway Score: Two   Imaging: DG Ankle Complete Left  Result Date: 04/15/2020 Please see detailed radiograph report in office note.   Labs:  CBC: Recent Labs    06/04/19 0926 07/11/19 1035 09/06/19 2117 10/08/19 0722  WBC  --  5.1  --   --   HGB 13.3 11.5* 13.3 12.6  HCT 39.0 36.0 39.0 37.0  PLT  --  204  --   --     COAGS: Recent Labs    07/11/19 1035  INR 1.0    BMP: Recent Labs    06/04/19 0926 09/06/19 2117 10/08/19 0722 04/21/20 1234  NA 133* 133* 140 133*  K 4.4 4.4 3.8 5.6*  CL 98 93* 98 92*  CO2  --   --   --  27  GLUCOSE 102* 488* 70 47*  BUN 28* 24* 27* 36*  CALCIUM  --   --   --  9.8  CREATININE 5.70* 4.10* 5.80* 6.89*  GFRNONAA  --   --   --  5*     Assessment and Plan:  ESRD on HD via RUE AVF, with low access flow of AVF. Plan for image-guided fistulagram with possible intervention, with possible image-guided tunneled HD catheter placement. Patient is NPO. Afebrile.  Lily Sobaluarro, interpretor was present throughout today's interaction.  Risks and benefits discussed with the patient including, but not limited to bleeding, infection, vascular injury, pulmonary embolism, need for tunneled HD catheter placement or even death. All of the patient's questions were answered, patient is agreeable to proceed. Consent signed and in chart.   Thank you for this interesting consult.  I greatly enjoyed meeting [facility] and look forward to participating in their care.  A copy of this report was sent to the requesting provider on this date.  Electronically Signed: Earley Abide, PA-C 04/21/2020, 2:08 PM   I spent a total of 25 Minutes in face to face in clinical consultation, greater than 50% of which was counseling/coordinating care for ESRD/fistulagram with possible intervention.

## 2020-04-22 ENCOUNTER — Ambulatory Visit: Payer: Self-pay | Admitting: Podiatry

## 2020-04-27 MED FILL — DULoxetine HCL 60 MG CPEP: 60 | 30 days supply | Qty: 30 | Fill #3

## 2020-04-27 MED FILL — LISINOPRIL 20 MG TABLET: 20 | 30 days supply | Qty: 30 | Fill #2

## 2020-04-27 MED FILL — ETHYL CHLORIDE AERO: 30 days supply | Qty: 116 | Fill #3

## 2020-04-27 MED FILL — LANTUS 100 UNITS/ML VIAL: 100 | 28 days supply | Qty: 10 | Fill #7

## 2020-05-06 ENCOUNTER — Ambulatory Visit: Payer: Self-pay | Admitting: Podiatry

## 2020-05-17 MED FILL — ATORVASTATIN CALCIUM 40 MG: 40 | 30 days supply | Qty: 30 | Fill #3

## 2020-05-24 ENCOUNTER — Ambulatory Visit (INDEPENDENT_AMBULATORY_CARE_PROVIDER_SITE_OTHER): Payer: Self-pay | Admitting: Podiatry

## 2020-05-24 ENCOUNTER — Encounter: Payer: Self-pay | Admitting: Podiatry

## 2020-05-24 ENCOUNTER — Other Ambulatory Visit: Payer: Self-pay

## 2020-05-24 ENCOUNTER — Other Ambulatory Visit: Payer: Self-pay | Admitting: Podiatry

## 2020-05-24 DIAGNOSIS — N186 End stage renal disease: Secondary | ICD-10-CM

## 2020-05-24 DIAGNOSIS — L89623 Pressure ulcer of left heel, stage 3: Secondary | ICD-10-CM

## 2020-05-24 DIAGNOSIS — E1142 Type 2 diabetes mellitus with diabetic polyneuropathy: Secondary | ICD-10-CM

## 2020-05-24 MED ORDER — MUPIROCIN 2 % EX OINT
1.0000 | TOPICAL_OINTMENT | Freq: Two times a day (BID) | CUTANEOUS | 2 refills | Status: DC
Start: 2020-05-24 — End: 2020-05-24

## 2020-05-24 NOTE — Progress Notes (Signed)
  Subjective:  Patient ID: Sarah Kelley, female    DOB: 24-Jul-1950,  MRN: [id number]  Chief Complaint  Patient presents with  . Wound Check    left foot wound check. Pt stated that its been a while and it has not healed yet they are concerned about the healing process since she is a diabetic. Stated she does have some pain but it is not constant.     69 y.o. female returns with the above complaint. History confirmed with patient.  Here today with her son.  She also has a Optometrist that translates from [place] to [place].  Wound is taking quite a while to heal.  Still tender. Objective:  Physical Exam: +2 DP and PT pulses, the skin is warm and has good capillary refill.  There is a pressure ulceration on the plantar lateral heel that extends to subcutaneous tissue.  Now developing an eschar over it.  No exposed bone or tendon or capsule.  No purulence.  No malodor.  No signs of acute infection.  Dimensions 4 cm x 1.5 cm     Assessment:   No diagnosis found.   Plan:  Patient was evaluated and treated and all questions answered.  Patient educated on diabetes. Discussed proper diabetic foot care and discussed risks and complications of disease. Educated patient in depth on reasons to return to the office immediately should he/she discover anything concerning or new on the feet.  -Continue local wound care with mupirocin, refill sent today -I emphasized the importance of offloading the ulcer and all of the heel with a pillow or the blue boot that I gave him at last visit -No debridement performed today, if eschar is persistent at next visit will consider debridement, I do not think they will be able to afford Santyl as they do not have insurance.  No follow-ups on file.

## 2020-05-25 MED FILL — GABAPENTIN 300 MG CAPSULE: 300 | 30 days supply | Qty: 30 | Fill #1

## 2020-05-25 MED FILL — MUPIROCIN 2% OINTMENT: 2 | 7 days supply | Qty: 22 | Fill #0

## 2020-05-25 MED FILL — TRUEPLUS SYR 1ML 31GX5/16: 31G X 5/16" | 30 days supply | Qty: 100 | Fill #1

## 2020-05-25 MED FILL — ETHYL CHLORIDE AERO: 30 days supply | Qty: 116 | Fill #4

## 2020-06-08 ENCOUNTER — Other Ambulatory Visit: Payer: Self-pay | Admitting: Pharmacist

## 2020-06-08 ENCOUNTER — Other Ambulatory Visit: Payer: Self-pay | Admitting: Family Medicine

## 2020-06-08 DIAGNOSIS — E119 Type 2 diabetes mellitus without complications: Secondary | ICD-10-CM

## 2020-06-08 DIAGNOSIS — Z794 Long term (current) use of insulin: Secondary | ICD-10-CM

## 2020-06-08 DIAGNOSIS — I10 Essential (primary) hypertension: Secondary | ICD-10-CM

## 2020-06-08 MED ORDER — INSULIN GLARGINE 100 UNIT/ML ~~LOC~~ SOLN: SUBCUTANEOUS | 0 refills | Status: DC

## 2020-06-08 MED FILL — DULoxetine HCL 60 MG CPEP: 60 | 30 days supply | Qty: 30 | Fill #4

## 2020-06-08 NOTE — Telephone Encounter (Signed)
Requested medication (s) are due for refill today: no  Requested medication (s) are on the active medication list: yes   Last refill: 04/27/2020  Future visit scheduled: no  Notes to clinic: overdue for follow up appt Vm left for patient to callback and schedule    Requested Prescriptions  Pending Prescriptions Disp Refills   insulin glargine (LANTUS) 100 UNIT/ML injection 10 mL 5    Sig: INJECT SUBCUTANEOUSLY TWICE DAILY 20 UNITS IN THE MORNING AND 15 UNITS IN THE EVENING      Endocrinology:  Diabetes - Insulins Failed - 06/08/2020  8:58 AM      Failed - HBA1C is between 0 and 7.9 and within 180 days    HbA1c, POC (controlled diabetic range)  Date Value Ref Range Status  08/13/2019 8.7 (A) 0.0 - 7.0 % Final          Failed - Valid encounter within last 6 months    Recent Outpatient Visits           10 months ago Type 2 diabetes mellitus with chronic kidney disease on chronic dialysis, with long-term current use of insulin ([place])   [place], Clayton, MD   1 year ago Type 2 diabetes mellitus with chronic kidney disease on chronic dialysis, with long-term current use of insulin ([place])   [facility] [employer], Charlane Ferretti, MD   1 year ago Medication refill   [place], Deborah B, MD   1 year ago Diabetes mellitus without complication [facility])   [place], Charlane Ferretti, MD   1 year ago Insulin-requiring or dependent type II diabetes mellitus [facility])   [place], Levada Dy M, [place]                lisinopril (ZESTRIL) 20 MG tablet [Pharmacy Med Name: LISINOPRIL 20 MG TABLET 20 Tablet] 30 tablet 6    Sig: TAKE 1 TABLET (20 MG TOTAL) BY MOUTH DAILY.      Cardiovascular:  ACE Inhibitors Failed - 06/08/2020  8:58 AM      Failed - Cr in normal range and within 180 days    Creat  Date Value Ref Range Status   01/13/2016 3.97 (H) 0.50 - 0.99 mg/dL Final    Comment:      For patients > or = 71 years of age: The upper reference limit for Creatinine is approximately 13% higher for people identified as African-American.      Creatinine, Ser  Date Value Ref Range Status  04/21/2020 6.89 (H) 0.44 - 1.00 mg/dL Final   Creatinine, Urine  Date Value Ref Range Status  01/13/2016 95 20 - 320 mg/dL Final          Failed - K in normal range and within 180 days    Potassium  Date Value Ref Range Status  04/21/2020 5.6 (H) 3.5 - 5.1 mmol/L Final    Comment:    HEMOLYSIS AT THIS LEVEL MAY AFFECT RESULT          Failed - Valid encounter within last 6 months    Recent Outpatient Visits           10 months ago Type 2 diabetes mellitus with chronic kidney disease on chronic dialysis, with long-term current use of insulin ([place])   [place], Charlane Ferretti, MD   1 year ago Type 2 diabetes  mellitus with chronic kidney disease on chronic dialysis, with long-term current use of insulin ([place])    [facility] [name], MD   1 year ago Medication refill   [place], Deborah B, MD   1 year ago Diabetes mellitus without complication [facility])   [place] [name], MD   1 year ago Insulin-requiring or dependent type II diabetes mellitus [facility])   [place], [place], [place]              Passed - Patient is not pregnant      Passed - Last BP in normal range    BP Readings from Last 1 Encounters:  04/21/20 (!) 113/53

## 2020-06-09 MED FILL — LANTUS 100 UNITS/ML VIAL: 100 | 28 days supply | Qty: 10 | Fill #0

## 2020-06-10 ENCOUNTER — Encounter: Payer: Self-pay | Admitting: Physician Assistant

## 2020-06-10 ENCOUNTER — Other Ambulatory Visit: Payer: Self-pay

## 2020-06-10 ENCOUNTER — Ambulatory Visit: Payer: Self-pay | Attending: Physician Assistant | Admitting: Physician Assistant

## 2020-06-10 ENCOUNTER — Other Ambulatory Visit: Payer: Self-pay | Admitting: Physician Assistant

## 2020-06-10 VITALS — BP 145/71 | HR 68 | Temp 98.7°F | Resp 18 | Ht 59.0 in

## 2020-06-10 DIAGNOSIS — E119 Type 2 diabetes mellitus without complications: Secondary | ICD-10-CM

## 2020-06-10 DIAGNOSIS — R1310 Dysphagia, unspecified: Secondary | ICD-10-CM

## 2020-06-10 DIAGNOSIS — Z794 Long term (current) use of insulin: Secondary | ICD-10-CM

## 2020-06-10 DIAGNOSIS — E1122 Type 2 diabetes mellitus with diabetic chronic kidney disease: Secondary | ICD-10-CM

## 2020-06-10 DIAGNOSIS — K5909 Other constipation: Secondary | ICD-10-CM

## 2020-06-10 DIAGNOSIS — Z992 Dependence on renal dialysis: Secondary | ICD-10-CM

## 2020-06-10 DIAGNOSIS — E1149 Type 2 diabetes mellitus with other diabetic neurological complication: Secondary | ICD-10-CM

## 2020-06-10 DIAGNOSIS — R748 Abnormal levels of other serum enzymes: Secondary | ICD-10-CM

## 2020-06-10 DIAGNOSIS — I10 Essential (primary) hypertension: Secondary | ICD-10-CM

## 2020-06-10 DIAGNOSIS — N186 End stage renal disease: Secondary | ICD-10-CM

## 2020-06-10 LAB — POCT GLYCOSYLATED HEMOGLOBIN (HGB A1C): Hemoglobin A1C: 8 % — AB (ref 4.0–5.6)

## 2020-06-10 LAB — GLUCOSE, POCT (MANUAL RESULT ENTRY): POC Glucose: 220 mg/dl — AB (ref 70–99)

## 2020-06-10 MED ORDER — LISINOPRIL 20 MG PO TABS: 20.0000 mg | ORAL_TABLET | Freq: Every day | ORAL | 0 refills | Status: DC

## 2020-06-10 MED ORDER — ATORVASTATIN CALCIUM 40 MG PO TABS: 40.0000 mg | ORAL_TABLET | Freq: Every day | ORAL | 6 refills | Status: DC

## 2020-06-10 MED ORDER — GABAPENTIN 300 MG PO CAPS: 300.0000 mg | ORAL_CAPSULE | Freq: Every day | ORAL | 2 refills | Status: DC

## 2020-06-10 MED ORDER — INSULIN GLARGINE 100 UNIT/ML ~~LOC~~ SOLN: SUBCUTANEOUS | 0 refills | Status: DC

## 2020-06-10 MED ORDER — "INSULIN SYRINGE-NEEDLE U-100 31G X 5/16"" 1 ML MISC"
5 refills | Status: DC
  Filled 2020-10-05: qty 100, 30d supply, fill #0
  Filled 2020-12-02: qty 100, 30d supply, fill #1
  Filled 2021-02-03: qty 100, 30d supply, fill #2

## 2020-06-10 MED ORDER — OMEPRAZOLE 20 MG PO CPDR: 20.0000 mg | DELAYED_RELEASE_CAPSULE | Freq: Every day | ORAL | 3 refills | Status: DC

## 2020-06-10 MED ORDER — POLYETHYLENE GLYCOL 3350 17 GM/SCOOP PO POWD: 17.0000 g | Freq: Two times a day (BID) | ORAL | 1 refills | Status: DC | PRN

## 2020-06-10 MED ORDER — DULOXETINE HCL 60 MG PO CPEP: 60.0000 mg | ORAL_CAPSULE | Freq: Every day | ORAL | 6 refills | Status: DC

## 2020-06-10 MED ORDER — CETIRIZINE HCL 10 MG PO TABS: 10.0000 mg | ORAL_TABLET | Freq: Every day | ORAL | 1 refills | Status: DC

## 2020-06-10 MED FILL — ATORVASTATIN CALCIUM 40 MG: 40 | 30 days supply | Qty: 30 | Fill #0

## 2020-06-10 MED FILL — LISINOPRIL 20 MG TABLET: 20 | 30 days supply | Qty: 30 | Fill #0

## 2020-06-10 MED FILL — POLYETHYLENE GLYCOL 3350 PO: 17 | 7 days supply | Qty: 238 | Fill #0

## 2020-06-10 MED FILL — OMEPRAZOLE 20 MG CAP: 20 | 30 days supply | Qty: 30 | Fill #0

## 2020-06-10 NOTE — Patient Instructions (Addendum)
You will start omeprazole once daily to help with the cough while eating.  Please let us know if this does not help.  It may take a week of taking the medication before you notice a difference.  Please return for fasting labs.  We will call you with your lab results.   Diabetes mellitus y nutricin, en adultos Diabetes Mellitus and Nutrition, Adult Si sufre de diabetes (diabetes mellitus), es muy importante tener hbitos alimenticios saludables debido a que sus niveles de Designer, television/film set sangre (glucosa) se ven afectados en gran medida por lo que come y bebe. [place] alimentos saludables en las cantidades [place], aproximadamente a la [employer], [place] ayudar a:  Aeronautical engineer glucemia.  Disminuir el riesgo de sufrir una enfermedad cardaca.  Mejorar la presin arterial.  Science writer o mantener un peso saludable. Todas las personas que sufren de diabetes son diferentes y cada una tiene necesidades diferentes en cuanto a un plan de alimentacin. El mdico puede recomendarle que trabaje con un especialista en dietas y nutricin (nutricionista) para Financial trader plan para usted. Su plan de alimentacin puede variar segn factores como:  Las caloras que necesita.  Los medicamentos que toma.  Su peso.  Sus niveles de glucemia, presin arterial y colesterol.  Su nivel de [place].  Otras afecciones que tenga, como enfermedades cardacas o renales. Cmo me afectan los carbohidratos? Los carbohidratos, o hidratos de carbono, afectan su nivel de glucemia ms que cualquier otro tipo de alimento. La ingesta de carbohidratos naturalmente aumenta la cantidad de [employer]. El recuento de carbohidratos es un mtodo destinado a Catering manager un registro de la cantidad de carbohidratos que se consumen. El recuento de carbohidratos es importante para Theatre manager la glucemia a un nivel saludable, especialmente si utiliza insulina o toma determinados medicamentos por va oral para la  diabetes. Es importante conocer la cantidad de carbohidratos que se pueden ingerir en cada comida sin correr Engineer, manufacturing. Esto es Psychologist, forensic. Su nutricionista puede ayudarlo a calcular la cantidad de carbohidratos que debe ingerir en cada comida y en cada refrigerio. Entre los alimentos que contienen carbohidratos, se incluyen:  Pan, cereal, arroz, pastas y galletas.  Papas y maz.  Guisantes, frijoles y lentejas.  Leche y Estate agent.  Lambert Mody y [place].  Postres, como pasteles, galletas, helado y caramelos. Cmo me afecta el alcohol? El alcohol puede provocar disminuciones sbitas de la glucemia (hipoglucemia), especialmente si utiliza insulina o toma determinados medicamentos por va oral para la diabetes. La hipoglucemia es una afeccin potencialmente mortal. Los sntomas de la hipoglucemia (somnolencia, mareos y confusin) son similares a los sntomas de haber consumido demasiado alcohol. Si el mdico afirma que el alcohol es seguro para usted, [place] estas pautas:  Limite el consumo de alcohol a no ms de 59medida por da si es mujer y no est [place], y a 7medidas si es hombre. Una medida equivale a 12oz (361ml) de cerveza, 5oz (145ml) de vino o 1oz (44ml) de bebidas alcohlicas de alta graduacin.  No beba con el estmago vaco.  Mantngase hidratado bebiendo agua, refrescos dietticos o t helado sin azcar.  Tenga en cuenta que los refrescos comunes, los jugos y otras bebida para Optician, dispensing pueden contener mucha azcar y se deben contar como carbohidratos. Cules son algunos consejos para seguir este plan?  Leer las etiquetas de los alimentos  Comience por leer el tamao de la porcin en la "Informacin nutricional" en las etiquetas de los alimentos envasados y las  bebidas. La cantidad de caloras, carbohidratos, grasas y otros nutrientes mencionados en la etiqueta se basan en una porcin del alimento. Muchos alimentos contienen ms de una porcin por  envase.  Verifique la cantidad total de gramos (g) de carbohidratos totales en una porcin. Puede calcular la cantidad de porciones de carbohidratos al dividir el total de carbohidratos por 15. Por ejemplo, si un alimento tiene un total de 30g de carbohidratos, equivale a 2 porciones de carbohidratos.  Verifique la cantidad de gramos (g) de grasas saturadas y grasas trans en una porcin. Escoja alimentos que no contengan grasa o que tengan un bajo contenido.  Verifique la cantidad de miligramos (mg) de sal (sodio) en una porcin. [place] [employer] de las personas deben limitar la ingesta de sodio total a menos de 2300mg  por Training and development officer.  Siempre consulte la informacin nutricional de los alimentos etiquetados como "con bajo contenido de grasa" o "sin grasa". Estos alimentos pueden tener un mayor contenido de Location manager agregada o carbohidratos refinados, y deben evitarse.  Hable con su nutricionista para identificar sus objetivos diarios en cuanto a los nutrientes mencionados en la etiqueta. Al ir de compras  Evite comprar alimentos procesados, enlatados o precocinados. Estos alimentos tienden a Special educational needs teacher mayor cantidad de [place], sodio y azcar agregada.  Compre en la zona exterior de la tienda de comestibles. Esta zona incluye frutas y verduras frescas, granos a granel, carnes frescas y productos lcteos frescos. Al cocinar  Utilice mtodos de coccin a baja temperatura, como hornear, en lugar de mtodos de coccin a alta temperatura, como frer en abundante aceite.  Cocine con aceites saludables, como el aceite de [place], canola o [place].  Evite cocinar con manteca, crema o carnes con alto contenido de grasa. Planificacin de las comidas  Coma las comidas y los refrigerios regularmente, preferentemente a la misma hora todos [place]. Evite pasar largos perodos de tiempo sin comer.  Consuma alimentos ricos en fibra, como frutas frescas, verduras, frijoles y cereales integrales. Consulte a su  nutricionista sobre cuntas porciones de carbohidratos puede consumir en cada comida.  Consuma entre 4 y 6 onzas (oz) de protenas magras por da, como carnes [place], pollo, pescado, huevos o tofu. Una onza de protena magra equivale a: ? 1 onza de carne, pollo o pescado. ? 1huevo. ?  taza de tofu.  Coma algunos alimentos por da que contengan grasas saludables, como aguacates, frutos secos, semillas y pescado. Estilo de vida  Controle su nivel de glucemia con regularidad.  Haga actividad fsica habitualmente como se lo haya indicado el mdico. Esto puede incluir lo siguiente: ? 167minutos semanales de ejercicio de intensidad moderada o alta. Esto podra incluir caminatas dinmicas, ciclismo o gimnasia acutica. ? Realizar ejercicios de elongacin y de fortalecimiento, como yoga o levantamiento de pesas, por lo menos 2veces por semana.  Tome los [employer] se lo haya indicado el mdico.  No consuma ningn producto que contenga nicotina o tabaco, como cigarrillos y Psychologist, sport and exercise. Si necesita ayuda para dejar de fumar, consulte al [employer] con un asesor o instructor en diabetes para identificar estrategias para controlar el estrs y cualquier desafo emocional y social. Preguntas para hacerle al mdico  Es necesario que consulte a Radio broadcast assistant en el cuidado de la diabetes?  Es necesario que me rena con un nutricionista?  A qu nmero puedo llamar si tengo preguntas?  Cules son los mejores momentos para controlar la glucemia? Dnde encontrar ms informacin:  Asociacin Estadounidense de la Diabetes (American Diabetes Association): diabetes.org  Academia de Nutricin y Information systems manager (Academy of Nutrition and Dietetics): www.eatright.[place] Diabetes y las Enfermedades Digestivas y [place] of Diabetes and Digestive and Kidney Diseases, NIH): [employer] Resumen  Un plan de alimentacin saludable lo  ayudar a Aeronautical engineer glucemia y Theatre manager un estilo de vida saludable.  Trabajar con un especialista en dietas y nutricin (nutricionista) puede ayudarlo a Insurance claims handler de alimentacin para usted.  Tenga en cuenta que los carbohidratos (hidratos de carbono) y el alcohol tienen efectos inmediatos en sus niveles de glucemia. Es importante contar los carbohidratos que ingiere y consumir alcohol con prudencia. Esta informacin no tiene [name] el consejo del mdico. Asegrese de hacerle al mdico cualquier pregunta que tenga. Document Revised: 02/27/2017 Document Reviewed: 10/09/2016 Elsevier Patient Education  [place].

## 2020-06-10 NOTE — Progress Notes (Signed)
Established Patient Office Visit  Subjective:  Patient ID: Sarah Kelley, female    DOB: October 09, 1950  Age: 69 y.o. MRN: [id number]  CC: No chief complaint on file.   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  Son is present and provides majority of history.  Reports that he has been having difficulty getting her to agree to have her blood glucose levels checked.  Reports that she complains that the tips of her fingers are very sore and it is very painful for her to do the check.  Reports that she has been having a cough while eating, denies food getting stuck, but does endorse that she will have some issues swallowing pills.  Reports that she has the cough regardless of what foods she is eating.  Reports this has been present for the past couple of months.    Past Medical History:  Diagnosis Date  . Diabetes mellitus   . ESRD (end stage renal disease) ([place])    dialysis  . Hyperlipidemia   . Hypertension   . Iron deficiency anemia   . MRSA infection    hx of  . Neuropathy    Diabetic  . Pneumonia 07/2016  . Renal disorder   . Seizure-like activity ([place])   . Sepsis ([place])   . Thyroid disease   . Wears dentures     Past Surgical History:  Procedure Laterality Date  . AMPUTATION Left 05/21/2015   Procedure: Left Foot 3rd Ray Amputation;  Surgeon: Newt Minion, MD;  Location: [facility];  Service: Orthopedics;  Laterality: Left;  . AV FISTULA PLACEMENT Left 03/28/2016   Procedure: ARTERIOVENOUS (AV) FISTULA CREATION LEFT ARM;  Surgeon: Waynetta Sandy, MD;  Location: [facility];  Service: Vascular;  Laterality: Left;  . AV FISTULA PLACEMENT Right 06/04/2019   Procedure: ARTERIOVENOUS (AV) FISTULA CREATION RIGHT ARM;  Surgeon:  Waynetta Sandy, MD;  Location: [facility];  Service: Vascular;  Laterality: Right;  . DILATION AND CURETTAGE OF UTERUS    . EYE SURGERY    . FISTULA SUPERFICIALIZATION Left 09/04/2016   Procedure: FISTULA SUPERFICIALIZATION LEFT UPPER ARM;  Surgeon: Waynetta Sandy, MD;  Location: [facility];  Service: Vascular;  Laterality: Left;  . FISTULA SUPERFICIALIZATION Right 10/08/2019   Procedure: FISTULA SUPERFICIALIZATION OR RIGHT ARM;  Surgeon: Waynetta Sandy, MD;  Location: [place] [facility];  Service: Vascular;  Laterality: Right;  . IR AV DIALY SHUNT INTRO NEEDLE/INTRAC INITIAL W/PTA/STENT/IMG LT Left 06/13/2017  . IR AV DIALY SHUNT INTRO NEEDLE/INTRAC INITIAL W/PTA/STENT/IMG LT Left 09/12/2017  . IR AV DIALY SHUNT INTRO NEEDLE/INTRACATH INITIAL W/PTA/IMG LEFT  11/10/2016  . IR AV DIALY SHUNT INTRO NEEDLE/INTRACATH INITIAL W/PTA/IMG LEFT  03/09/2017  . IR AV DIALY SHUNT INTRO NEEDLE/INTRACATH INITIAL W/PTA/IMG LEFT  12/12/2017  . IR AV DIALY SHUNT INTRO NEEDLE/INTRACATH INITIAL W/PTA/IMG LEFT  02/27/2018  . IR AV DIALY SHUNT INTRO NEEDLE/INTRACATH INITIAL W/PTA/IMG LEFT  06/05/2018  . IR AV DIALY SHUNT INTRO NEEDLE/INTRACATH INITIAL W/PTA/IMG LEFT  09/11/2018  . IR AV DIALY SHUNT INTRO NEEDLE/INTRACATH INITIAL W/PTA/IMG RIGHT Right 04/21/2020  . IR DIALY SHUNT INTRO NEEDLE/INTRACATH INITIAL W/IMG LEFT Left 05/08/2018  . IR FLUORO GUIDE CV LINE RIGHT  02/01/2019  . IR GENERIC HISTORICAL  08/01/2016   IR FLUORO GUIDE CV LINE RIGHT 08/01/2016 [name], MD [facility]  . IR GENERIC HISTORICAL  08/01/2016   IR US GUIDE VASC ACCESS RIGHT 08/01/2016 [name], MD [facility]  . IR REMOVAL TUN CV CATH W/O FL  11/01/2016  . IR REMOVAL TUN CV CATH W/O FL  01/02/2020  . IR THROMBECTOMY AV FISTULA W/THROMBOLYSIS INC/SHUNT/IMG LEFT Left 01/31/2019  . IR THROMBECTOMY AV FISTULA W/THROMBOLYSIS/PTA INC/SHUNT/IMG LEFT Left 01/27/2019  . IR US GUIDE VASC ACCESS LEFT  11/10/2016  . IR US GUIDE VASC ACCESS LEFT   09/12/2017  . IR US GUIDE VASC ACCESS LEFT  12/12/2017  . IR US GUIDE VASC ACCESS LEFT  06/05/2018  . IR US GUIDE VASC ACCESS LEFT  09/11/2018  . IR US GUIDE VASC ACCESS LEFT  01/27/2019  . IR US GUIDE VASC ACCESS LEFT  02/01/2019  . IR US GUIDE VASC ACCESS RIGHT  02/01/2019    History reviewed. No pertinent family history.  Social History   Socioeconomic History  . Marital status: Widowed    Spouse name: Not on file  . Number of children: 11  . Years of education: no schooling  . Highest education level: Not on file  Occupational History  . Not on file  Tobacco Use  . Smoking status: Never Smoker  . Smokeless tobacco: Never Used  Vaping Use  . Vaping Use: Never used  Substance and Sexual Activity  . Alcohol use: Not Currently    Alcohol/week: 0.0 standard drinks    Comment: rare- a beer every now and then  . Drug use: No  . Sexual activity: Not Currently  Other Topics Concern  . Not on file  Social History Narrative   Lives with son Marcial    Right handed   Caffeine: every once in a while a little coffee, chamomile tea, or soda   Social Determinants of Health   Financial Resource Strain: Not on file  Food Insecurity: Not on file  Transportation Needs: Not on file  Physical Activity: Not on file  Stress: Not on file  Social Connections: Not on file  Intimate Partner Violence: Not on file    Outpatient Medications Prior to Visit  Medication Sig Dispense Refill  . acetaminophen (OFIRMEV) 10 MG/ML SOLN Take by mouth.    . Amino Acids-Protein Hydrolys (FEEDING SUPPLEMENT, PRO-STAT SUGAR FREE 64,) LIQD Take 30 mLs by mouth 2 (two) times daily. 900 mL 0  . B Complex-C-Zn-Folic Acid (DIALYVITE 175 WITH ZINC) 0.8 MG TABS Take by mouth.    . B Complex-C-Zn-Folic Acid (DIALYVITE 102 WITH ZINC) 0.8 MG TABS Take 1 tablet by mouth daily.    . Blood Glucose Monitoring Suppl (TRUE METRIX METER) DEVI 1 each by Does not apply route 3 (three) times daily before meals. 1 Device 0  .  cinacalcet (SENSIPAR) 60 MG tablet Take by mouth.    . doxercalciferol (HECTOROL) 0.5 MCG capsule Doxercalciferol (Hectorol)    . ethyl chloride spray SPRAY 1 SPRAY TO THE SKIN 3 TIMES A WEEK AS NEEDED. SPRAY A SMALL AMOUNT JUST PRIOR TO NEEDLE INSERTION. 116 mL 10  . glucose blood (TRUE METRIX BLOOD GLUCOSE TEST) test strip Use 3 times daily before meals 100 each 12  . Methoxy PEG-Epoetin Beta (MIRCERA IJ) Mircera    . atorvastatin (LIPITOR) 40 MG tablet Take 1 tablet (40 mg total) by mouth daily. 30 tablet 6  . cetirizine (ZYRTEC) 10 MG tablet Take 1 tablet (10 mg total) by mouth daily. 30 tablet 1  . DULoxetine (CYMBALTA) 60 MG capsule Take 1  capsule (60 mg total) by mouth daily. (Patient taking differently: Take 60 mg by mouth every evening.) 30 capsule 6  . gabapentin (NEURONTIN) 300 MG capsule TAKE 1 CAPSULE (300 MG TOTAL) BY MOUTH AT BEDTIME. 30 capsule 2  . insulin glargine (LANTUS) 100 UNIT/ML injection INJECT SUBCUTANEOUSLY TWICE DAILY 20 UNITS IN THE MORNING AND 15 UNITS IN THE EVENING 10 mL 0  . lisinopril (ZESTRIL) 20 MG tablet TAKE 1 TABLET (20 MG TOTAL) BY MOUTH DAILY. 30 tablet 0  . mupirocin ointment (BACTROBAN) 2 % Apply 1 application topically 2 (two) times daily. 30 g 2  . Nutritional Supplements (FEEDING SUPPLEMENT, NEPRO CARB STEADY,) LIQD Take 237 mLs by mouth Every Tuesday,Thursday,and Saturday with dialysis.     Marland Kitchen oxyCODONE-acetaminophen (PERCOCET) 5-325 MG tablet Take 1 tablet by mouth every 6 (six) hours as needed. 12 tablet 0  . SENSIPAR 30 MG tablet SMARTSIG:1 Tablet(s) By Mouth Every Evening    . sucroferric oxyhydroxide (VELPHORO) 500 MG chewable tablet Chew 500 mg by mouth 2 (two) times daily with a meal.     . TRUEplus Lancets 28G MISC USE AS DIRECTED THREE TIMES DAILY TO TEST SUGAR BEFORE MEALS 100 each 12  . polyethylene glycol powder (GLYCOLAX/MIRALAX) 17 GM/SCOOP powder Take 17 g by mouth 2 (two) times daily as needed. 3350 g 1  . TRUEPLUS INSULIN SYRINGE 31G X  5/16" 1 ML MISC USE AS DIRECTED 100 each 5   Facility-Administered Medications Prior to Visit  Medication Dose Route Frequency Provider Last Rate Last Admin  . 0.9 %  sodium chloride infusion   Intravenous Continuous Monia Sabal, PA-C        No Known Allergies  ROS Review of Systems  Constitutional: Negative.   HENT: Negative.   Eyes: Negative.   Cardiovascular: Negative.   Gastrointestinal: Negative.   Endocrine: Negative.   Genitourinary: Negative.   Musculoskeletal: Negative.   Skin: Negative.   Allergic/Immunologic: Negative.   Neurological: Negative.   Hematological: Negative.   Psychiatric/Behavioral: Negative.       Objective:    Physical Exam Constitutional:      General: She is not in acute distress.    Appearance: Normal appearance. She is not ill-appearing.     Comments: In wheelchair  HENT:     Head: Normocephalic and atraumatic.     Right Ear: External ear normal.     Left Ear: External ear normal.     Nose: Nose normal.     Mouth/Throat:     Mouth: Mucous membranes are moist.     Pharynx: Oropharynx is clear.  Eyes:     Extraocular Movements: Extraocular movements intact.     Conjunctiva/sclera: Conjunctivae normal.     Pupils: Pupils are equal, round, and reactive to light.  Cardiovascular:     Rate and Rhythm: Normal rate and regular rhythm.     Pulses: Normal pulses.     Heart sounds: Normal heart sounds.  Pulmonary:     Effort: Pulmonary effort is normal.     Breath sounds: Normal breath sounds.  Abdominal:     General: Abdomen is flat.     Palpations: Abdomen is soft.     Tenderness: There is no abdominal tenderness.  Musculoskeletal:        General: No swelling.     Cervical back: Normal range of motion and neck supple.  Skin:    General: Skin is warm and dry.  Neurological:     General: No focal deficit present.  Mental Status: She is alert and oriented to person, place, and time.  Psychiatric:        Mood and Affect: Mood  normal.        Behavior: Behavior normal.        Thought Content: Thought content normal.        Judgment: Judgment normal.     BP (!) 145/71 (BP Location: Left Arm, Patient Position: Sitting, Cuff Size: Normal)   Pulse 68   Temp 98.7 F (37.1 C) (Oral)   Resp 18   Ht 4' 11"  (1.499 m)   LMP  (LMP Unknown)   SpO2 98%   BMI 32.06 kg/m  Wt Readings from Last 3 Encounters:  10/08/19 158 lb 11.7 oz (72 kg)  08/22/19 158 lb (71.7 kg)  08/13/19 158 lb (71.7 kg)     Health Maintenance Due  Topic Date Due  . OPHTHALMOLOGY EXAM  Never done  . COVID-19 Vaccine (1) Never done  . MAMMOGRAM  Never done  . COLONOSCOPY  Never done  . DEXA SCAN  Never done  . INFLUENZA VACCINE  02/01/2020  . FOOT EXAM  05/12/2020    There are no preventive care reminders to display for this patient.  Lab Results  Component Value Date   TSH 2.316 06/14/2015   Lab Results  Component Value Date   WBC 5.6 06/11/2020   HGB 9.7 (L) 06/11/2020   HCT 29.6 (L) 06/11/2020   MCV 95 06/11/2020   PLT 190 06/11/2020   Lab Results  Component Value Date   NA 139 06/11/2020   K 4.0 06/11/2020   CO2 27 06/11/2020   GLUCOSE 50 (L) 06/11/2020   BUN 30 (H) 06/11/2020   CREATININE 5.53 (H) 06/11/2020   BILITOT 0.3 06/11/2020   ALKPHOS 177 (H) 06/11/2020   AST 17 06/11/2020   ALT 15 06/11/2020   PROT 6.7 06/11/2020   ALBUMIN 3.6 (L) 06/11/2020   CALCIUM 9.9 06/11/2020   ANIONGAP 14 04/21/2020   Lab Results  Component Value Date   CHOL 128 06/11/2020   Lab Results  Component Value Date   HDL 37 (L) 06/11/2020   Lab Results  Component Value Date   LDLCALC 74 06/11/2020   Lab Results  Component Value Date   TRIG 89 06/11/2020   Lab Results  Component Value Date   CHOLHDL 3.5 06/11/2020   Lab Results  Component Value Date   HGBA1C 8.0 (A) 06/10/2020      Assessment & Plan:   Problem List Items Addressed This Visit      Cardiovascular and Mediastinum   Essential hypertension    Relevant Medications   lisinopril (ZESTRIL) 20 MG tablet   atorvastatin (LIPITOR) 40 MG tablet   Other Relevant Orders   CMP14+EGFR (Completed)     Endocrine   Diabetic neuropathy (HCC)   Relevant Medications   lisinopril (ZESTRIL) 20 MG tablet   gabapentin (NEURONTIN) 300 MG capsule   DULoxetine (CYMBALTA) 60 MG capsule   insulin glargine (LANTUS) 100 UNIT/ML injection   atorvastatin (LIPITOR) 40 MG tablet   Other Relevant Orders   CBC with Differential (Completed)   Insulin-requiring or dependent type II diabetes mellitus (HCC)   Relevant Medications   lisinopril (ZESTRIL) 20 MG tablet   insulin glargine (LANTUS) 100 UNIT/ML injection   atorvastatin (LIPITOR) 40 MG tablet   Insulin Syringe-Needle U-100 (TRUEPLUS INSULIN SYRINGE) 31G X 5/16" 1 ML MISC    Other Visit Diagnoses    Type 2 diabetes  mellitus with chronic kidney disease on chronic dialysis, with long-term current use of insulin (HCC)    -  Primary   Relevant Medications   lisinopril (ZESTRIL) 20 MG tablet   insulin glargine (LANTUS) 100 UNIT/ML injection   atorvastatin (LIPITOR) 40 MG tablet   Other Relevant Orders   HgB A1c (Completed)   Glucose (CBG) (Completed)   Lipid Panel (Completed)   Other constipation       Relevant Medications   polyethylene glycol powder (GLYCOLAX/MIRALAX) 17 GM/SCOOP powder   Dysphagia, unspecified type       Relevant Medications   omeprazole (PRILOSEC) 20 MG capsule    1. Type 2 diabetes mellitus with chronic kidney disease on chronic dialysis, with long-term current use of insulin (HCC) A1c improved from 8.7-8.0.  Patient education given on reducing the amount of starchy carbohydrates, son endorsed patient was eating many tortillas during the day.  Will reach out to clinic pharmacist to see if there is a program to help patient afford the libre freestyle continuous monitor instead of having to use finger sticks - HgB A1c - Glucose (CBG) - atorvastatin (LIPITOR) 40 MG tablet;  Take 1 tablet (40 mg total) by mouth daily.  Dispense: 30 tablet; Refill: 6  2. Essential hypertension Continue current regimen, encouraged to check blood pressure on a daily basis, keep a written log so blood pressure can be better managed, strongly encouraged low-sodium diet - lisinopril (ZESTRIL) 20 MG tablet; Take 1 tablet (20 mg total) by mouth daily.  Dispense: 30 tablet; Refill: 0  3. Other diabetic neurological complication associated with type 2 diabetes mellitus ([place]) Continue current regimen - gabapentin (NEURONTIN) 300 MG capsule; Take 1 capsule (300 mg total) by mouth at bedtime.  Dispense: 30 capsule; Refill: 2 - DULoxetine (CYMBALTA) 60 MG capsule; Take 1 capsule (60 mg total) by mouth daily.  Dispense: 30 capsule; Refill: 6  4. Other constipation Continue current regimen - polyethylene glycol powder (GLYCOLAX/MIRALAX) 17 GM/SCOOP powder; Take 17 g by mouth 2 (two) times daily as needed.  Dispense: 3350 g; Refill: 1  5. Insulin-requiring or dependent type II diabetes mellitus (HCC) Continue current regimen - insulin glargine (LANTUS) 100 UNIT/ML injection; INJECT SUBCUTANEOUSLY TWICE DAILY 20 UNITS IN THE MORNING AND 15 UNITS IN THE EVENING  Dispense: 10 mL; Refill: 0 - Insulin Syringe-Needle U-100 (TRUEPLUS INSULIN SYRINGE) 31G X 5/16" 1 ML MISC; USE AS DIRECTED  Dispense: 100 each; Refill: 5  6. Dysphagia, unspecified type Trial Prilosec, consider GI referral if no improvement  - omeprazole (PRILOSEC) 20 MG capsule; Take 1 capsule (20 mg total) by mouth daily.  Dispense: 30 capsule; Refill: 3   Meds ordered this encounter  Medications  . lisinopril (ZESTRIL) 20 MG tablet    Sig: Take 1 tablet (20 mg total) by mouth daily.    Dispense:  30 tablet    Refill:  0    Pt. Needs appointment for further refills.    Order Specific Question:   Supervising Provider    Answer:   Noralyn Pick  . gabapentin (NEURONTIN) 300 MG capsule    Sig: Take 1 capsule (300 mg  total) by mouth at bedtime.    Dispense:  30 capsule    Refill:  2    Order Specific Question:   Supervising Provider    Answer:   Joya Gaskins, PATRICK E [[id number]]  . DULoxetine (CYMBALTA) 60 MG capsule    Sig: Take 1 capsule (60 mg total) by mouth daily.  Dispense:  30 capsule    Refill:  6    Order Specific Question:   Supervising Provider    Answer:   Asencion Noble E [[id number]]  . polyethylene glycol powder (GLYCOLAX/MIRALAX) 17 GM/SCOOP powder    Sig: Take 17 g by mouth 2 (two) times daily as needed.    Dispense:  3350 g    Refill:  1    Order Specific Question:   Supervising Provider    Answer:   Joya Gaskins, PATRICK E [[id number]]  . insulin glargine (LANTUS) 100 UNIT/ML injection    Sig: INJECT SUBCUTANEOUSLY TWICE DAILY 20 UNITS IN THE MORNING AND 15 UNITS IN THE EVENING    Dispense:  10 mL    Refill:  0    Must have office visit for refills    Order Specific Question:   Supervising Provider    Answer:   Elsie Stain [[id number]]  . cetirizine (ZYRTEC) 10 MG tablet    Sig: Take 1 tablet (10 mg total) by mouth daily.    Dispense:  30 tablet    Refill:  1    Order Specific Question:   Supervising Provider    Answer:   Joya Gaskins, PATRICK E [[id number]]  . atorvastatin (LIPITOR) 40 MG tablet    Sig: Take 1 tablet (40 mg total) by mouth daily.    Dispense:  30 tablet    Refill:  6    Order Specific Question:   Supervising Provider    Answer:   Asencion Noble E [[id number]]  . omeprazole (PRILOSEC) 20 MG capsule    Sig: Take 1 capsule (20 mg total) by mouth daily.    Dispense:  30 capsule    Refill:  3    Order Specific Question:   Supervising Provider    Answer:   Joya Gaskins, PATRICK E [[id number]]  . Insulin Syringe-Needle U-100 (TRUEPLUS INSULIN SYRINGE) 31G X 5/16" 1 ML MISC    Sig: USE AS DIRECTED    Dispense:  100 each    Refill:  5    Order Specific Question:   Supervising Provider    Answer:   Elsie Stain [[id number]]    I have reviewed the patient's medical history (PMH, PSH, Social History, Family  History, Medications, and allergies) , and have been updated if relevant. I spent 30 minutes reviewing chart and  face to face time with patient.    Follow-up: No follow-ups on file.    Loraine Grip Mayers, PA-C

## 2020-06-10 NOTE — Progress Notes (Signed)
Patient denies pain at this time. Patient has eaten today and patient has taken medication today.

## 2020-06-11 ENCOUNTER — Ambulatory Visit: Payer: Self-pay | Attending: Family Medicine

## 2020-06-12 LAB — CBC WITH DIFFERENTIAL/PLATELET
Basophils Absolute: 0.1 10*3/uL (ref 0.0–0.2)
Basos: 1 %
EOS (ABSOLUTE): 0.2 10*3/uL (ref 0.0–0.4)
Eos: 3 %
Hematocrit: 29.6 % — ABNORMAL LOW (ref 34.0–46.6)
Hemoglobin: 9.7 g/dL — ABNORMAL LOW (ref 11.1–15.9)
Immature Grans (Abs): 0 10*3/uL (ref 0.0–0.1)
Immature Granulocytes: 0 %
Lymphocytes Absolute: 1.5 10*3/uL (ref 0.7–3.1)
Lymphs: 26 %
MCH: 31 pg (ref 26.6–33.0)
MCHC: 32.8 g/dL (ref 31.5–35.7)
MCV: 95 fL (ref 79–97)
Monocytes Absolute: 0.5 10*3/uL (ref 0.1–0.9)
Monocytes: 9 %
Neutrophils Absolute: 3.4 10*3/uL (ref 1.4–7.0)
Neutrophils: 61 %
Platelets: 190 10*3/uL (ref 150–450)
RBC: 3.13 x10E6/uL — ABNORMAL LOW (ref 3.77–5.28)
RDW: 13.4 % (ref 11.7–15.4)
WBC: 5.6 10*3/uL (ref 3.4–10.8)

## 2020-06-12 LAB — CMP14+EGFR
ALT: 15 IU/L (ref 0–32)
AST: 17 IU/L (ref 0–40)
Albumin/Globulin Ratio: 1.2 (ref 1.2–2.2)
Albumin: 3.6 g/dL — ABNORMAL LOW (ref 3.7–4.7)
Alkaline Phosphatase: 177 IU/L — ABNORMAL HIGH (ref 44–121)
BUN/Creatinine Ratio: 5 — ABNORMAL LOW (ref 12–28)
BUN: 30 mg/dL — ABNORMAL HIGH (ref 8–27)
Bilirubin Total: 0.3 mg/dL (ref 0.0–1.2)
CO2: 27 mmol/L (ref 20–29)
Calcium: 9.9 mg/dL (ref 8.7–10.3)
Chloride: 96 mmol/L (ref 96–106)
Creatinine, Ser: 5.53 mg/dL — ABNORMAL HIGH (ref 0.57–1.00)
GFR calc Af Amer: 8 mL/min/{1.73_m2} — ABNORMAL LOW (ref >59)
GFR calc non Af Amer: 7 mL/min/{1.73_m2} — ABNORMAL LOW (ref >59)
Globulin, Total: 3.1 g/dL (ref 1.5–4.5)
Glucose: 50 mg/dL — ABNORMAL LOW (ref 65–99)
Potassium: 4 mmol/L (ref 3.5–5.2)
Sodium: 139 mmol/L (ref 134–144)
Total Protein: 6.7 g/dL (ref 6.0–8.5)

## 2020-06-12 LAB — LIPID PANEL
Chol/HDL Ratio: 3.5 ratio (ref 0.0–4.4)
Cholesterol, Total: 128 mg/dL (ref 100–199)
HDL: 37 mg/dL — ABNORMAL LOW (ref >39)
LDL Chol Calc (NIH): 74 mg/dL (ref 0–99)
Triglycerides: 89 mg/dL (ref 0–149)
VLDL Cholesterol Cal: 17 mg/dL (ref 5–40)

## 2020-06-13 ENCOUNTER — Encounter: Payer: Self-pay | Admitting: Physician Assistant

## 2020-06-13 DIAGNOSIS — K5909 Other constipation: Secondary | ICD-10-CM | POA: Insufficient documentation

## 2020-06-14 DIAGNOSIS — R748 Abnormal levels of other serum enzymes: Secondary | ICD-10-CM | POA: Insufficient documentation

## 2020-06-14 NOTE — Addendum Note (Signed)
Addended by: Kennieth Rad on: 06/14/2020 01:40 PM   Modules accepted: Orders

## 2020-06-24 ENCOUNTER — Telehealth: Payer: Self-pay | Admitting: *Deleted

## 2020-06-24 NOTE — Telephone Encounter (Signed)
Medical Assistant used [employer] to contact patient.  Interpreter Name: Byrd Hesselbach #: [id number] Patient is aware of anemia worsening and needing to adhere to supplement. Patient aware of liver enzyme being elevated and a recheck being needed in 6 weeks. Patient is aware at that time she may have a reduction in cholesterol medication if this level is still up at the recheck.

## 2020-06-24 NOTE — Telephone Encounter (Signed)
-----  Message from Kennieth Rad, [place] sent at 06/14/2020  1:40 PM EST ----- Please call patient and let her know that her fasting blood glucose level was in the hypoglycemic range, this was present also 1 month prior.  She has an elevated liver enzyme unable to determine from previous labs if this is a chronic condition, alk phos from 2 years prior was within normal limits, elevation may be from atorvastatin.  Will have patient recheck hepatic function in 6 weeks, if no improvement, consider reduction of Lipitor .anemia of chronic disease appears to be worsening slightly, 11 months ago hemoglobin was 11.5, today 9.7.  MCV within normal limits.  Cholesterol levels within normal limits

## 2020-07-01 MED FILL — GABAPENTIN 300 MG CAPSULE: 300 | 30 days supply | Qty: 30 | Fill #2

## 2020-07-01 MED FILL — DULoxetine HCL 60 MG CPEP: 60 | 30 days supply | Qty: 30 | Fill #5

## 2020-07-05 ENCOUNTER — Ambulatory Visit: Payer: Self-pay | Admitting: Podiatry

## 2020-07-08 MED FILL — LANTUS 100 UNITS/ML VIAL: 100 | 28 days supply | Qty: 10 | Fill #0

## 2020-07-12 ENCOUNTER — Other Ambulatory Visit (HOSPITAL_COMMUNITY): Payer: Self-pay | Admitting: Nephrology

## 2020-07-12 DIAGNOSIS — N186 End stage renal disease: Secondary | ICD-10-CM

## 2020-07-13 ENCOUNTER — Other Ambulatory Visit: Payer: Self-pay | Admitting: Physician Assistant

## 2020-07-13 ENCOUNTER — Other Ambulatory Visit (HOSPITAL_COMMUNITY): Payer: Self-pay | Admitting: Nephrology

## 2020-07-13 DIAGNOSIS — I10 Essential (primary) hypertension: Secondary | ICD-10-CM

## 2020-07-13 DIAGNOSIS — Z992 Dependence on renal dialysis: Secondary | ICD-10-CM

## 2020-07-13 DIAGNOSIS — N186 End stage renal disease: Secondary | ICD-10-CM

## 2020-07-13 MED FILL — ATORVASTATIN CALCIUM 40 MG: 40 | 30 days supply | Qty: 30 | Fill #1

## 2020-07-13 MED FILL — OMEPRAZOLE 20 MG CAP: 20 | 30 days supply | Qty: 30 | Fill #1

## 2020-07-14 ENCOUNTER — Other Ambulatory Visit: Payer: Self-pay | Admitting: Radiology

## 2020-07-14 ENCOUNTER — Inpatient Hospital Stay (HOSPITAL_COMMUNITY)
Admission: EM | Admit: 2020-07-14 | Discharge: 2020-07-18 | DRG: 252 | Disposition: A | Discharge status: Home / Self Care | Payer: Medicaid Other | Attending: Internal Medicine | Admitting: Internal Medicine

## 2020-07-14 ENCOUNTER — Other Ambulatory Visit: Payer: Self-pay

## 2020-07-14 ENCOUNTER — Emergency Department (HOSPITAL_COMMUNITY): Payer: Medicaid Other

## 2020-07-14 DIAGNOSIS — Z20822 Contact with and (suspected) exposure to covid-19: Secondary | ICD-10-CM | POA: Diagnosis not present

## 2020-07-14 DIAGNOSIS — E1140 Type 2 diabetes mellitus with diabetic neuropathy, unspecified: Secondary | ICD-10-CM | POA: Diagnosis present

## 2020-07-14 DIAGNOSIS — Z79899 Other long term (current) drug therapy: Secondary | ICD-10-CM

## 2020-07-14 DIAGNOSIS — T82868A Thrombosis of vascular prosthetic devices, implants and grafts, initial encounter: Secondary | ICD-10-CM

## 2020-07-14 DIAGNOSIS — F32A Depression, unspecified: Secondary | ICD-10-CM | POA: Diagnosis present

## 2020-07-14 DIAGNOSIS — E785 Hyperlipidemia, unspecified: Secondary | ICD-10-CM | POA: Diagnosis present

## 2020-07-14 DIAGNOSIS — H5461 Unqualified visual loss, right eye, normal vision left eye: Secondary | ICD-10-CM | POA: Diagnosis present

## 2020-07-14 DIAGNOSIS — F0390 Unspecified dementia without behavioral disturbance: Secondary | ICD-10-CM | POA: Diagnosis present

## 2020-07-14 DIAGNOSIS — T82818A Embolism of vascular prosthetic devices, implants and grafts, initial encounter: Principal | ICD-10-CM | POA: Diagnosis present

## 2020-07-14 DIAGNOSIS — T82868S Thrombosis of vascular prosthetic devices, implants and grafts, sequela: Secondary | ICD-10-CM

## 2020-07-14 DIAGNOSIS — Z89422 Acquired absence of other left toe(s): Secondary | ICD-10-CM

## 2020-07-14 DIAGNOSIS — I120 Hypertensive chronic kidney disease with stage 5 chronic kidney disease or end stage renal disease: Secondary | ICD-10-CM | POA: Diagnosis present

## 2020-07-14 DIAGNOSIS — E11649 Type 2 diabetes mellitus with hypoglycemia without coma: Secondary | ICD-10-CM | POA: Diagnosis not present

## 2020-07-14 DIAGNOSIS — N186 End stage renal disease: Secondary | ICD-10-CM | POA: Diagnosis not present

## 2020-07-14 DIAGNOSIS — Z9115 Patient's noncompliance with renal dialysis: Secondary | ICD-10-CM

## 2020-07-14 DIAGNOSIS — E8770 Fluid overload, unspecified: Secondary | ICD-10-CM | POA: Diagnosis present

## 2020-07-14 DIAGNOSIS — Y832 Surgical operation with anastomosis, bypass or graft as the cause of abnormal reaction of the patient, or of later complication, without mention of misadventure at the time of the procedure: Secondary | ICD-10-CM | POA: Diagnosis present

## 2020-07-14 DIAGNOSIS — D649 Anemia, unspecified: Secondary | ICD-10-CM | POA: Diagnosis present

## 2020-07-14 DIAGNOSIS — E1122 Type 2 diabetes mellitus with diabetic chronic kidney disease: Secondary | ICD-10-CM | POA: Diagnosis not present

## 2020-07-14 DIAGNOSIS — G9349 Other encephalopathy: Secondary | ICD-10-CM | POA: Diagnosis present

## 2020-07-14 DIAGNOSIS — Z992 Dependence on renal dialysis: Secondary | ICD-10-CM | POA: Diagnosis not present

## 2020-07-14 DIAGNOSIS — R278 Other lack of coordination: Secondary | ICD-10-CM | POA: Diagnosis not present

## 2020-07-14 DIAGNOSIS — N19 Unspecified kidney failure: Secondary | ICD-10-CM

## 2020-07-14 DIAGNOSIS — E119 Type 2 diabetes mellitus without complications: Secondary | ICD-10-CM

## 2020-07-14 DIAGNOSIS — L89626 Pressure-induced deep tissue damage of left heel: Secondary | ICD-10-CM | POA: Diagnosis present

## 2020-07-14 DIAGNOSIS — E872 Acidosis: Secondary | ICD-10-CM | POA: Diagnosis not present

## 2020-07-14 DIAGNOSIS — E875 Hyperkalemia: Secondary | ICD-10-CM | POA: Diagnosis present

## 2020-07-14 DIAGNOSIS — G253 Myoclonus: Secondary | ICD-10-CM | POA: Diagnosis present

## 2020-07-14 DIAGNOSIS — E162 Hypoglycemia, unspecified: Secondary | ICD-10-CM

## 2020-07-14 DIAGNOSIS — T829XXA Unspecified complication of cardiac and vascular prosthetic device, implant and graft, initial encounter: Secondary | ICD-10-CM | POA: Diagnosis not present

## 2020-07-14 DIAGNOSIS — Z794 Long term (current) use of insulin: Secondary | ICD-10-CM

## 2020-07-14 DIAGNOSIS — F419 Anxiety disorder, unspecified: Secondary | ICD-10-CM | POA: Diagnosis present

## 2020-07-14 HISTORY — PX: IR US GUIDE VASC ACCESS RIGHT: IMG2390

## 2020-07-14 HISTORY — PX: IR FLUORO GUIDE CV LINE RIGHT: IMG2283

## 2020-07-14 LAB — RENAL FUNCTION PANEL
Albumin: 2.7 g/dL — ABNORMAL LOW (ref 3.5–5.0)
Anion gap: 18 — ABNORMAL HIGH (ref 5–15)
BUN: 129 mg/dL — ABNORMAL HIGH (ref 8–23)
CO2: 24 mmol/L (ref 22–32)
Calcium: 9.4 mg/dL (ref 8.9–10.3)
Chloride: 100 mmol/L (ref 98–111)
Creatinine, Ser: 17.12 mg/dL — ABNORMAL HIGH (ref 0.44–1.00)
GFR, Estimated: 2 mL/min — ABNORMAL LOW (ref >60)
Glucose, Bld: 114 mg/dL — ABNORMAL HIGH (ref 70–99)
Phosphorus: 6.8 mg/dL — ABNORMAL HIGH (ref 2.5–4.6)
Potassium: 6.5 mmol/L (ref 3.5–5.1)
Sodium: 142 mmol/L (ref 135–145)

## 2020-07-14 LAB — BASIC METABOLIC PANEL
Anion gap: 21 — ABNORMAL HIGH (ref 5–15)
BUN: 132 mg/dL — ABNORMAL HIGH (ref 8–23)
CO2: 20 mmol/L — ABNORMAL LOW (ref 22–32)
Calcium: 9.8 mg/dL (ref 8.9–10.3)
Chloride: 102 mmol/L (ref 98–111)
Creatinine, Ser: 17.31 mg/dL — ABNORMAL HIGH (ref 0.44–1.00)
GFR, Estimated: 2 mL/min — ABNORMAL LOW (ref >60)
Glucose, Bld: 37 mg/dL — CL (ref 70–99)
Potassium: 6.5 mmol/L (ref 3.5–5.1)
Sodium: 143 mmol/L (ref 135–145)

## 2020-07-14 LAB — CBG MONITORING, ED
Glucose-Capillary: 52 mg/dL — ABNORMAL LOW (ref 70–99)
Glucose-Capillary: 126 mg/dL — ABNORMAL HIGH (ref 70–99)
Glucose-Capillary: 102 mg/dL — ABNORMAL HIGH (ref 70–99)
Glucose-Capillary: 65 mg/dL — ABNORMAL LOW (ref 70–99)
Glucose-Capillary: 99 mg/dL (ref 70–99)

## 2020-07-14 LAB — CBC
HCT: 39 % (ref 36.0–46.0)
Hemoglobin: 11.7 g/dL — ABNORMAL LOW (ref 12.0–15.0)
MCH: 31.4 pg (ref 26.0–34.0)
MCHC: 30 g/dL (ref 30.0–36.0)
MCV: 104.6 fL — ABNORMAL HIGH (ref 80.0–100.0)
Platelets: 204 10*3/uL (ref 150–400)
RBC: 3.73 MIL/uL — ABNORMAL LOW (ref 3.87–5.11)
RDW: 14.6 % (ref 11.5–15.5)
WBC: 7 10*3/uL (ref 4.0–10.5)
nRBC: 0 % (ref 0.0–0.2)

## 2020-07-14 LAB — RESP PANEL BY RT-PCR (RSV, FLU A&B, COVID)  RVPGX2
Influenza A by PCR: NEGATIVE
Influenza B by PCR: NEGATIVE
Resp Syncytial Virus by PCR: NEGATIVE
SARS Coronavirus 2 by RT PCR: NEGATIVE

## 2020-07-14 LAB — HEPATITIS B SURFACE ANTIGEN: Hepatitis B Surface Ag: NONREACTIVE

## 2020-07-14 LAB — HEPATITIS B SURFACE ANTIBODY,QUALITATIVE: Hep B S Ab: REACTIVE — AB

## 2020-07-14 LAB — POTASSIUM: Potassium: 6.3 mmol/L (ref 3.5–5.1)

## 2020-07-14 MED ORDER — ETHYL CHLORIDE EX AERO: INHALATION_SPRAY | CUTANEOUS | Status: DC | PRN

## 2020-07-14 MED ORDER — HEPARIN SODIUM (PORCINE) 1000 UNIT/ML DIALYSIS
2000.0000 [IU] | INTRAMUSCULAR | Status: DC | PRN
  Filled 2020-07-14: qty 2

## 2020-07-14 MED ORDER — LIDOCAINE-EPINEPHRINE 1 %-1:100000 IJ SOLN
INTRAMUSCULAR | Status: AC
  Filled 2020-07-14: qty 1

## 2020-07-14 MED ORDER — OXYCODONE-ACETAMINOPHEN 5-325 MG PO TABS: 1.0000 | ORAL_TABLET | Freq: Four times a day (QID) | ORAL | Status: DC | PRN

## 2020-07-14 MED ORDER — POLYETHYLENE GLYCOL 3350 17 GM/SCOOP PO POWD: 17.0000 g | Freq: Two times a day (BID) | ORAL | Status: DC | PRN

## 2020-07-14 MED ORDER — INSULIN ASPART 100 UNIT/ML IV SOLN
5.0000 [IU] | Freq: Once | INTRAVENOUS | Status: AC
  Administered 2020-07-14: 5 [IU] via INTRAVENOUS

## 2020-07-14 MED ORDER — HEPARIN SODIUM (PORCINE) 1000 UNIT/ML IJ SOLN
INTRAMUSCULAR | Status: AC
  Filled 2020-07-14: qty 1

## 2020-07-14 MED ORDER — ATORVASTATIN CALCIUM 40 MG PO TABS
40.0000 mg | ORAL_TABLET | Freq: Every day | ORAL | Status: DC
  Administered 2020-07-16 – 2020-07-17 (×2): 40 mg via ORAL
  Filled 2020-07-14 (×3): qty 1

## 2020-07-14 MED ORDER — DULOXETINE HCL 60 MG PO CPEP
60.0000 mg | ORAL_CAPSULE | Freq: Every day | ORAL | Status: DC
  Administered 2020-07-16: 60 mg via ORAL
  Filled 2020-07-14 (×3): qty 1

## 2020-07-14 MED ORDER — GABAPENTIN 300 MG PO CAPS: 300.0000 mg | ORAL_CAPSULE | Freq: Every day | ORAL | Status: DC

## 2020-07-14 MED ORDER — INSULIN ASPART 100 UNIT/ML ~~LOC~~ SOLN
0.0000 [IU] | Freq: Three times a day (TID) | SUBCUTANEOUS | Status: DC
  Administered 2020-07-15: 1 [IU] via SUBCUTANEOUS

## 2020-07-14 MED ORDER — PRO-STAT SUGAR FREE PO LIQD: 30.0000 mL | Freq: Two times a day (BID) | ORAL | Status: DC

## 2020-07-14 MED ORDER — LORATADINE 10 MG PO TABS
10.0000 mg | ORAL_TABLET | Freq: Every day | ORAL | Status: DC
  Administered 2020-07-16 – 2020-07-17 (×2): 10 mg via ORAL
  Filled 2020-07-14 (×3): qty 1

## 2020-07-14 MED ORDER — LORAZEPAM 2 MG/ML IJ SOLN
0.5000 mg | Freq: Once | INTRAMUSCULAR | Status: AC
  Administered 2020-07-14: 0.5 mg via INTRAVENOUS
  Filled 2020-07-14: qty 1

## 2020-07-14 MED ORDER — CHLORHEXIDINE GLUCONATE CLOTH 2 % EX PADS
6.0000 | MEDICATED_PAD | Freq: Every day | CUTANEOUS | Status: DC
  Administered 2020-07-15 – 2020-07-17 (×4): 6 via TOPICAL

## 2020-07-14 MED ORDER — HYDRALAZINE HCL 25 MG PO TABS: 25.0000 mg | ORAL_TABLET | Freq: Four times a day (QID) | ORAL | Status: DC | PRN

## 2020-07-14 MED ORDER — CALCIUM GLUCONATE-NACL 2-0.675 GM/100ML-% IV SOLN
2.0000 g | Freq: Once | INTRAVENOUS | Status: AC
  Administered 2020-07-14: 2000 mg via INTRAVENOUS
  Filled 2020-07-14: qty 100

## 2020-07-14 MED ORDER — SODIUM BICARBONATE 8.4 % IV SOLN
50.0000 meq | Freq: Once | INTRAVENOUS | Status: AC
  Administered 2020-07-14: 50 meq via INTRAVENOUS
  Filled 2020-07-14: qty 50

## 2020-07-14 MED ORDER — NEPRO/CARBSTEADY PO LIQD
237.0000 mL | ORAL | Status: DC
  Filled 2020-07-14: qty 237

## 2020-07-14 MED ORDER — CALCIUM GLUCONATE-NACL 1-0.675 GM/50ML-% IV SOLN
INTRAVENOUS | Status: AC
  Administered 2020-07-14: 1000 mg
  Filled 2020-07-14: qty 100

## 2020-07-14 MED ORDER — MUPIROCIN 2 % EX OINT
1.0000 "application " | TOPICAL_OINTMENT | Freq: Two times a day (BID) | CUTANEOUS | Status: DC
  Administered 2020-07-15 – 2020-07-17 (×4): 1 via TOPICAL
  Filled 2020-07-14 (×3): qty 22

## 2020-07-14 MED ORDER — SUCROFERRIC OXYHYDROXIDE 500 MG PO CHEW
500.0000 mg | CHEWABLE_TABLET | Freq: Two times a day (BID) | ORAL | Status: DC
Start: 2020-07-14 — End: 2020-07-18
  Administered 2020-07-16 – 2020-07-17 (×2): 500 mg via ORAL
  Filled 2020-07-14 (×9): qty 1

## 2020-07-14 MED ORDER — ONDANSETRON HCL 4 MG PO TABS: 4.0000 mg | ORAL_TABLET | Freq: Four times a day (QID) | ORAL | Status: DC | PRN

## 2020-07-14 MED ORDER — CINACALCET HCL 30 MG PO TABS
60.0000 mg | ORAL_TABLET | Freq: Every day | ORAL | Status: DC
  Administered 2020-07-16 – 2020-07-17 (×2): 60 mg via ORAL
  Filled 2020-07-14 (×4): qty 2

## 2020-07-14 MED ORDER — CINACALCET HCL 30 MG PO TABS: 30.0000 mg | ORAL_TABLET | Freq: Every day | ORAL | Status: DC

## 2020-07-14 MED ORDER — HEPARIN SODIUM (PORCINE) 5000 UNIT/ML IJ SOLN
5000.0000 [IU] | Freq: Two times a day (BID) | INTRAMUSCULAR | Status: DC
  Administered 2020-07-15 – 2020-07-16 (×4): 5000 [IU] via SUBCUTANEOUS
  Filled 2020-07-14 (×4): qty 1

## 2020-07-14 MED ORDER — DEXTROSE 50 % IV SOLN
1.0000 | Freq: Once | INTRAVENOUS | Status: AC
  Administered 2020-07-14: 50 mL via INTRAVENOUS
  Filled 2020-07-14: qty 50

## 2020-07-14 MED ORDER — PANTOPRAZOLE SODIUM 40 MG PO TBEC
40.0000 mg | DELAYED_RELEASE_TABLET | Freq: Every day | ORAL | Status: DC
  Administered 2020-07-15 – 2020-07-17 (×3): 40 mg via ORAL
  Filled 2020-07-14 (×4): qty 1

## 2020-07-14 MED ORDER — ONDANSETRON HCL 4 MG/2ML IJ SOLN: 4.0000 mg | Freq: Four times a day (QID) | INTRAMUSCULAR | Status: DC | PRN

## 2020-07-14 NOTE — ED Notes (Signed)
Pt transported to dialysis at this time.

## 2020-07-14 NOTE — ED Provider Notes (Signed)
[facility] Provider Note   CSN: [id number] Arrival date & time: 07/14/20  1124     History Chief Complaint  Patient presents with  . Vascular Access Problem    Sarah Kelley is a 70 y.o. female.  Patient is right-sided fistula stopped working.  She has missed multiple dialysis sessions, she is feeling fatigued, she is hypoglycemic.  Family says she has been having intermittent spasm, not eating and drinking well.        Past Medical History:  Diagnosis Date  . Diabetes mellitus   . ESRD (end stage renal disease) ([facility])    dialysis  . Hyperlipidemia   . Hypertension   . Iron deficiency anemia   . MRSA infection    hx of  . Neuropathy    Diabetic  . Pneumonia 07/2016  . Renal disorder   . Seizure-like activity ([place])   . Sepsis ([place])   . Thyroid disease   . Wears dentures     Patient Active Problem List   Diagnosis Date Noted  . Elevated liver enzymes 06/14/2020  . Other constipation 06/13/2020  . AVF (arteriovenous fistula) ([place])   . MSSA (methicillin susceptible Staphylococcus aureus) infection   . Nausea & vomiting 02/11/2018  . Insomnia 01/18/2017  . Altered mental status   . Goals of care, counseling/discussion   . Dysphagia 12/04/2016  . UTI (urinary tract infection) 12/04/2016  . Severe sepsis ([place]) 12/02/2016  . Hyperglycemia 12/02/2016  . Acute encephalopathy 07/28/2016  . CAP (community acquired pneumonia) 07/28/2016  . Pneumonia 07/28/2016  . ESRD (end stage renal disease) ([place])   . Phantom pain ([place]) 09/13/2015  . Hyperlipidemia 06/01/2015  . Toe amputation status   . Insulin-requiring or dependent type II diabetes mellitus ([place])   . Chronic kidney disease (CKD) stage G4/A1, severely decreased glomerular filtration rate (GFR) between 15-29 mL/min/1.73 square meter and albuminuria creatinine ratio less than 30 mg/g ([facility]) 05/18/2015  . Type 2 diabetes mellitus with chronic kidney disease on  chronic dialysis, with long-term current use of insulin ([place]) 05/18/2015  . Osteomyelitis ([place]) 05/18/2015  . Abdominal pain 05/18/2015  . Osteomyelitis of left foot ([place]) 05/18/2015  . Diabetic neuropathy ([place]) 01/14/2007  . Diabetes mellitus without complication ([place]) 00/17/4944  . HLD (hyperlipidemia) 01/14/2007  . Normocytic anemia 01/14/2007  . ANXIETY 01/14/2007  . Depression 01/14/2007  . Essential hypertension 01/14/2007    Past Surgical History:  Procedure Laterality Date  . AMPUTATION Left 05/21/2015   Procedure: Left Foot 3rd Ray Amputation;  Surgeon: Newt Minion, MD;  Location: [facility];  Service: Orthopedics;  Laterality: Left;  . AV FISTULA PLACEMENT Left 03/28/2016   Procedure: ARTERIOVENOUS (AV) FISTULA CREATION LEFT ARM;  Surgeon: Waynetta Sandy, MD;  Location: [facility];  Service: Vascular;  Laterality: Left;  . AV FISTULA PLACEMENT Right 06/04/2019   Procedure: ARTERIOVENOUS (AV) FISTULA CREATION RIGHT ARM;  Surgeon: Waynetta Sandy, MD;  Location: [facility];  Service: Vascular;  Laterality: Right;  . DILATION AND CURETTAGE OF UTERUS    . EYE SURGERY    . FISTULA SUPERFICIALIZATION Left 09/04/2016   Procedure: FISTULA SUPERFICIALIZATION LEFT UPPER ARM;  Surgeon: Waynetta Sandy, MD;  Location: [facility];  Service: Vascular;  Laterality: Left;  . FISTULA SUPERFICIALIZATION Right 10/08/2019   Procedure: FISTULA SUPERFICIALIZATION OR RIGHT ARM;  Surgeon: Waynetta Sandy, MD;  Location: [facility];  Service: Vascular;  Laterality: Right;  . IR AV DIALY SHUNT INTRO NEEDLE/INTRAC INITIAL W/PTA/STENT/IMG LT  Left 06/13/2017  . IR AV DIALY SHUNT INTRO NEEDLE/INTRAC INITIAL W/PTA/STENT/IMG LT Left 09/12/2017  . IR AV DIALY SHUNT INTRO NEEDLE/INTRACATH INITIAL W/PTA/IMG LEFT  11/10/2016  . IR AV DIALY SHUNT INTRO NEEDLE/INTRACATH INITIAL W/PTA/IMG LEFT  03/09/2017  . IR AV DIALY SHUNT INTRO NEEDLE/INTRACATH INITIAL W/PTA/IMG LEFT  12/12/2017  . IR AV DIALY SHUNT  INTRO NEEDLE/INTRACATH INITIAL W/PTA/IMG LEFT  02/27/2018  . IR AV DIALY SHUNT INTRO NEEDLE/INTRACATH INITIAL W/PTA/IMG LEFT  06/05/2018  . IR AV DIALY SHUNT INTRO NEEDLE/INTRACATH INITIAL W/PTA/IMG LEFT  09/11/2018  . IR AV DIALY SHUNT INTRO NEEDLE/INTRACATH INITIAL W/PTA/IMG RIGHT Right 04/21/2020  . IR DIALY SHUNT INTRO NEEDLE/INTRACATH INITIAL W/IMG LEFT Left 05/08/2018  . IR FLUORO GUIDE CV LINE RIGHT  02/01/2019  . IR GENERIC HISTORICAL  08/01/2016   IR FLUORO GUIDE CV LINE RIGHT 08/01/2016 [name], MD [facility]  . IR GENERIC HISTORICAL  08/01/2016   IR US GUIDE VASC ACCESS RIGHT 08/01/2016 [name], MD [facility]  . IR REMOVAL TUN CV CATH W/O FL  11/01/2016  . IR REMOVAL TUN CV CATH W/O FL  01/02/2020  . IR THROMBECTOMY AV FISTULA W/THROMBOLYSIS INC/SHUNT/IMG LEFT Left 01/31/2019  . IR THROMBECTOMY AV FISTULA W/THROMBOLYSIS/PTA INC/SHUNT/IMG LEFT Left 01/27/2019  . IR US GUIDE VASC ACCESS LEFT  11/10/2016  . IR US GUIDE VASC ACCESS LEFT  09/12/2017  . IR US GUIDE VASC ACCESS LEFT  12/12/2017  . IR US GUIDE VASC ACCESS LEFT  06/05/2018  . IR US GUIDE VASC ACCESS LEFT  09/11/2018  . IR US GUIDE VASC ACCESS LEFT  01/27/2019  . IR US GUIDE VASC ACCESS LEFT  02/01/2019  . IR US GUIDE VASC ACCESS RIGHT  02/01/2019     OB History   No obstetric history on file.     No family history on file.  Social History   Tobacco Use  . Smoking status: Never Smoker  . Smokeless tobacco: Never Used  Vaping Use  . Vaping Use: Never used  Substance Use Topics  . Alcohol use: Not Currently    Alcohol/week: 0.0 standard drinks    Comment: rare- a beer every now and then  . Drug use: No    Home Medications Prior to Admission medications   Medication Sig Start Date End Date Taking? Authorizing Provider  acetaminophen (OFIRMEV) 10 MG/ML SOLN Take by mouth. 08/09/19 08/07/20  [provider]  Amino Acids-Protein Hydrolys (FEEDING SUPPLEMENT, PRO-STAT SUGAR FREE 64,) LIQD Take 30 mLs by mouth 2  (two) times daily. 02/17/18   Elgergawy, Silver Huguenin, MD  atorvastatin (LIPITOR) 40 MG tablet Take 1 tablet (40 mg total) by mouth daily. 06/10/20   Mayers, Cari S, PA-C  B Complex-C-Zn-Folic Acid (DIALYVITE 101 WITH ZINC) 0.8 MG TABS Take by mouth. 05/18/20   [provider]  B Complex-C-Zn-Folic Acid (DIALYVITE 751 WITH ZINC) 0.8 MG TABS Take 1 tablet by mouth daily. 05/18/20   [provider]  Blood Glucose Monitoring Suppl (TRUE METRIX METER) DEVI 1 each by Does not apply route 3 (three) times daily before meals. 07/25/17   [name], MD  cetirizine (ZYRTEC) 10 MG tablet Take 1 tablet (10 mg total) by mouth daily. 06/10/20   Mayers, Cari S, PA-C  cinacalcet (SENSIPAR) 60 MG tablet Take by mouth. 12/11/19   [provider]  doxercalciferol (HECTOROL) 0.5 MCG capsule Doxercalciferol (Hectorol) 02/12/20 02/10/21  [provider]  DULoxetine (CYMBALTA) 60 MG capsule Take 1 capsule (60 mg total) by mouth daily.  06/10/20   Mayers, Cari S, PA-C  ethyl chloride spray SPRAY 1 SPRAY TO THE SKIN 3 TIMES A WEEK AS NEEDED. SPRAY A SMALL AMOUNT JUST PRIOR TO NEEDLE INSERTION. 12/24/19   [name], MD  gabapentin (NEURONTIN) 300 MG capsule Take 1 capsule (300 mg total) by mouth at bedtime. 06/10/20   Mayers, Cari S, PA-C  glucose blood (TRUE METRIX BLOOD GLUCOSE TEST) test strip Use 3 times daily before meals 10/17/19   [name], MD  insulin glargine (LANTUS) 100 UNIT/ML injection INJECT SUBCUTANEOUSLY TWICE DAILY 20 UNITS IN THE MORNING AND 15 UNITS IN THE EVENING 06/10/20   Mayers, Cari S, PA-C  Insulin Syringe-Needle U-100 (TRUEPLUS INSULIN SYRINGE) 31G X 5/16" 1 ML MISC USE AS DIRECTED 06/10/20   Mayers, Cari S, PA-C  lisinopril (ZESTRIL) 20 MG tablet Take 1 tablet (20 mg total) by mouth daily. 06/10/20   Mayers, Cari S, PA-C  Methoxy PEG-Epoetin Beta (MIRCERA IJ) Mircera 01/13/20 01/11/21  [provider]  mupirocin ointment (BACTROBAN) 2 % Apply 1 application  topically 2 (two) times daily. 05/24/20   McDonald, Stephan Minister, DPM  Nutritional Supplements (FEEDING SUPPLEMENT, NEPRO CARB STEADY,) LIQD Take 237 mLs by mouth Every Tuesday,Thursday,and Saturday with dialysis.     [provider]  omeprazole (PRILOSEC) 20 MG capsule Take 1 capsule (20 mg total) by mouth daily. 06/10/20   Mayers, Cari S, PA-C  oxyCODONE-acetaminophen (PERCOCET) 5-325 MG tablet Take 1 tablet by mouth every 6 (six) hours as needed. 10/08/19   Rhyne, Hulen Shouts, PA-C  polyethylene glycol powder (GLYCOLAX/MIRALAX) 17 GM/SCOOP powder Take 17 g by mouth 2 (two) times daily as needed. 06/10/20   Mayers, Loraine Grip, PA-C  SENSIPAR 30 MG tablet SMARTSIG:1 Tablet(s) By Mouth Every Evening 11/04/19   [provider]  sucroferric oxyhydroxide (VELPHORO) 500 MG chewable tablet Chew 500 mg by mouth 2 (two) times daily with a meal.     [provider]  TRUEplus Lancets 28G MISC USE AS DIRECTED THREE TIMES DAILY TO TEST SUGAR BEFORE MEALS 02/11/19   [name], MD    Allergies    Patient has no known allergies.  Review of Systems   Review of Systems  Unable to perform ROS: Acuity of condition    Physical Exam Updated Vital Signs BP (!) 156/81 (BP Location: Left Arm)   Pulse 79   Temp 98.4 F (36.9 C) (Oral)   Resp 15   LMP  (LMP Unknown)   SpO2 95%   Physical Exam Vitals and nursing note reviewed. Exam conducted with a chaperone present.  Constitutional:      Appearance: She is ill-appearing.  HENT:     Head: Normocephalic and atraumatic.     Nose: No rhinorrhea.  Eyes:     General:        Right eye: No discharge.        Left eye: No discharge.     Conjunctiva/sclera: Conjunctivae normal.  Cardiovascular:     Rate and Rhythm: Normal rate and regular rhythm.  Pulmonary:     Effort: Pulmonary effort is normal. No respiratory distress.     Breath sounds: No stridor.  Abdominal:     General: Abdomen is flat. There is no distension.     Palpations: Abdomen  is soft.     Tenderness: There is no abdominal tenderness.  Musculoskeletal:        General: No tenderness or signs of injury.     Comments: No thrill palpated in the  left or right upper extremity fistula  Skin:    General: Skin is warm and dry.  Neurological:     General: No focal deficit present.     Mental Status: She is alert.     Motor: No weakness.     Comments: patient is able to move all 4 extremities, she has a intermittent full body spasm, sensation intact throughout  Psychiatric:        Mood and Affect: Mood normal.        Behavior: Behavior normal.     ED Results / Procedures / Treatments   Labs (all labs ordered are listed, but only abnormal results are displayed) Labs Reviewed  BASIC METABOLIC PANEL - Abnormal; Notable for the following components:      Result Value   Potassium 6.5 (*)    CO2 20 (*)    Glucose, Bld 37 (*)    BUN 132 (*)    Creatinine, Ser 17.31 (*)    GFR, Estimated 2 (*)    Anion gap 21 (*)    All other components within normal limits  CBC - Abnormal; Notable for the following components:   RBC 3.73 (*)    Hemoglobin 11.7 (*)    MCV 104.6 (*)    All other components within normal limits  CBG MONITORING, ED - Abnormal; Notable for the following components:   Glucose-Capillary 52 (*)    All other components within normal limits  CBG MONITORING, ED - Abnormal; Notable for the following components:   Glucose-Capillary 126 (*)    All other components within normal limits  RESP PANEL BY RT-PCR (RSV, FLU A&B, COVID)  [id number]  URINALYSIS, ROUTINE W REFLEX MICROSCOPIC  POTASSIUM  POTASSIUM  POTASSIUM  POTASSIUM    EKG EKG Interpretation  Date/Time:  Wednesday July 14 2020 14:41:56 EST Ventricular Rate:  68 PR Interval:    QRS Duration: 124 QT Interval:  428 QTC Calculation: 456 R Axis:   -68 Text Interpretation: Atrial fibrillation RBBB and LAFB ST elevation, consider inferior injury Confirmed by Dewaine Conger [phone]) on 07/14/2020  3:31:41 PM   Radiology No results found.  Procedures .Critical Care Performed by: Breck Coons, MD Authorized by: Breck Coons, MD   Critical care provider statement:    Critical care time (minutes):  60   Critical care was necessary to treat or prevent imminent or life-threatening deterioration of the following conditions:  Metabolic crisis and renal failure   Critical care was time spent personally by me on the following activities:  Discussions with consultants, evaluation of patient's response to treatment, examination of patient, ordering and performing treatments and interventions, ordering and review of laboratory studies, ordering and review of radiographic studies, pulse oximetry, re-evaluation of patient's condition, obtaining history from patient or surrogate, review of old charts, blood draw for specimens and development of treatment plan with patient or surrogate   (including critical care time)  Medications Ordered in ED Medications  LORazepam (ATIVAN) injection 0.5 mg (has no administration in time range)  Chlorhexidine Gluconate Cloth 2 % PADS 6 each (has no administration in time range)  heparin sodium (porcine) 1000 UNIT/ML injection (has no administration in time range)  lidocaine-EPINEPHrine (XYLOCAINE W/EPI) 1 %-1:100000 (with pres) injection (has no administration in time range)  calcium gluconate in NaCl 1-0.675 GM/50ML-% IVPB (0 g  Stopped 07/14/20 1528)  calcium gluconate 2 g/ 100 mL sodium chloride IVPB (0 g Intravenous Stopped 07/14/20 1635)  insulin aspart (novoLOG) injection 5 Units (5  Units Intravenous Given 07/14/20 1624)    And  dextrose 50 % solution 50 mL (50 mLs Intravenous Given 07/14/20 1617)  sodium bicarbonate injection 50 mEq (50 mEq Intravenous Given 07/14/20 1623)    ED Course  I have reviewed the triage vital signs and the nursing notes.  Pertinent labs & imaging results that were available during my care of the patient were reviewed by me and  considered in my medical decision making (see chart for details).    MDM Rules/Calculators/A&P                         ESRD and hyperK, fistula not working, consulted nephrology for solution, they have contacted radiology to get a temp cath placed so they can dialyze the patient, they will immediately work to dialyze the patient here from the emergency department.  Left-sided EJ was placed by myself calcium and dextrose insulin bicarbonate given.  Per nephrology's recommendation 0.5 mg of Ativan to help prevent complications to uremia.  Patient will also need admission to the medicine service.  Was initially hypoglycemic given oral glucose and IV dextrose with insulin for hyperkalemia we will continue to assess level of glucose.  ECG without peaked T waves and QRS slightly widened.  Medications given for hyperkalemia to include calcium, glucose dextrose bicarbonate.  Medicine consulted for admission for this patient's many complications, will need for consultation for vascular access for dialysis in the future beyond IR's catheter placement.  The patient will be admitted to the hospitalist.  For the remainder this patient's care please see inpatient team notes.  I will intervene as needed while the patient remains in the emergency department.   CRITICAL CARE Performed by: Breck Coons   Total critical care time: 60 minutes  Critical care time was exclusive of separately billable procedures and treating other patients.  Critical care was necessary to treat or prevent imminent or life-threatening deterioration.  Critical care was time spent personally by me on the following activities: development of treatment plan with patient and/or surrogate as well as nursing, discussions with consultants, evaluation of patient's response to treatment, examination of patient, obtaining history from patient or surrogate, ordering and performing treatments and interventions, ordering and review of laboratory  studies, ordering and review of radiographic studies, pulse oximetry and re-evaluation of patient's condition.  Final Clinical Impression(s) / ED Diagnoses Final diagnoses:  Hyperkalemia  ESRD (end stage renal disease) ([place])  Hypoglycemia  Complication of arteriovenous dialysis fistula, initial encounter    Rx / DC Orders ED Discharge Orders    None       Breck Coons, MD 07/14/20 1650

## 2020-07-14 NOTE — H&P (Signed)
History and Physical    Tearsa Kowalewski EXN:[id number] DOB: [birth year] DOA: 07/14/2020  PCP: [name], MD (Confirm with patient/family/NH records and if not entered, this has to be entered at [facility] point of entry) Patient coming from: Home  I have personally briefly reviewed patient's old medical records in [place]  Chief Complaint: Unable to get dialysis  HPI: Sarah Kelley is a 70 y.o. female with medical history significant for ESRD on HD, chronic seizure-like activity, HTN, IDDM, diabetic neuropathy, right eye blindness, presented with increasing lethargic, feeling nausea glucose after missing several dialysis.  Patient routinely gets dialysis through AV fistula on the right arm.  Last dialysis was Thursday, and since then, she has had trouble with the right arm AV fistula and she missed 2 sessions of HD so far.  Since yesterday, patient became more sleepy, and complaining about feeling nausea and unable to eat or drink since yesterday evening.  She last received insulin was yesterday morning and in the evening family checked her blood sugar, which was less than 100 and did not give the evening dose of insulin.  Family also noticed increasing twitching like movement of her shoulders and arms which happened in the past when she missed her dialysis.  No fever chills, cough, denied any abdominal pain, no diarrhea.  ED Course: Significant uremia with BUN 132, potassium 6.5, creatinine 17.3 glucose 37.  Patient was given hyperkalemia cocktail in the ED, IR and nephrology informed and subclavian access was placed by IR emergently and patient is underway for emergency dialysis this evening.  Review of Systems: As per HPI otherwise 10 point review of systems negative.    Past Medical History:  Diagnosis Date  . Diabetes mellitus   . ESRD (end stage renal disease) ([place])    dialysis  . Hyperlipidemia   . Hypertension   . Iron deficiency anemia   . MRSA infection     hx of  . Neuropathy    Diabetic  . Pneumonia 07/2016  . Renal disorder   . Seizure-like activity ([place])   . Sepsis ([place])   . Thyroid disease   . Wears dentures     Past Surgical History:  Procedure Laterality Date  . AMPUTATION Left 05/21/2015   Procedure: Left Foot 3rd Ray Amputation;  Surgeon: Newt Minion, MD;  Location: [place];  Service: Orthopedics;  Laterality: Left;  . AV FISTULA PLACEMENT Left 03/28/2016   Procedure: ARTERIOVENOUS (AV) FISTULA CREATION LEFT ARM;  Surgeon: Waynetta Sandy, MD;  Location: [place];  Service: Vascular;  Laterality: Left;  . AV FISTULA PLACEMENT Right 06/04/2019   Procedure: ARTERIOVENOUS (AV) FISTULA CREATION RIGHT ARM;  Surgeon: Waynetta Sandy, MD;  Location: [place];  Service: Vascular;  Laterality: Right;  . DILATION AND CURETTAGE OF UTERUS    . EYE SURGERY    . FISTULA SUPERFICIALIZATION Left 09/04/2016   Procedure: FISTULA SUPERFICIALIZATION LEFT UPPER ARM;  Surgeon: Waynetta Sandy, MD;  Location: [place];  Service: Vascular;  Laterality: Left;  . FISTULA SUPERFICIALIZATION Right 10/08/2019   Procedure: FISTULA SUPERFICIALIZATION OR RIGHT ARM;  Surgeon: Waynetta Sandy, MD;  Location: [place];  Service: Vascular;  Laterality: Right;  . IR AV DIALY SHUNT INTRO NEEDLE/INTRAC INITIAL W/PTA/STENT/IMG LT Left 06/13/2017  . IR AV DIALY SHUNT INTRO NEEDLE/INTRAC INITIAL W/PTA/STENT/IMG LT Left 09/12/2017  . IR AV DIALY SHUNT INTRO NEEDLE/INTRACATH INITIAL W/PTA/IMG LEFT  11/10/2016  . IR AV DIALY SHUNT INTRO NEEDLE/INTRACATH INITIAL W/PTA/IMG LEFT  03/09/2017  .  IR AV DIALY SHUNT INTRO NEEDLE/INTRACATH INITIAL W/PTA/IMG LEFT  12/12/2017  . IR AV DIALY SHUNT INTRO NEEDLE/INTRACATH INITIAL W/PTA/IMG LEFT  02/27/2018  . IR AV DIALY SHUNT INTRO NEEDLE/INTRACATH INITIAL W/PTA/IMG LEFT  06/05/2018  . IR AV DIALY SHUNT INTRO NEEDLE/INTRACATH INITIAL W/PTA/IMG LEFT  09/11/2018  . IR AV DIALY SHUNT INTRO NEEDLE/INTRACATH INITIAL  W/PTA/IMG RIGHT Right 04/21/2020  . IR DIALY SHUNT INTRO NEEDLE/INTRACATH INITIAL W/IMG LEFT Left 05/08/2018  . IR FLUORO GUIDE CV LINE RIGHT  02/01/2019  . IR FLUORO GUIDE CV LINE RIGHT  07/14/2020  . IR GENERIC HISTORICAL  08/01/2016   IR FLUORO GUIDE CV LINE RIGHT 08/01/2016 [name], MD [facility]  . IR GENERIC HISTORICAL  08/01/2016   IR US GUIDE VASC ACCESS RIGHT 08/01/2016 [name], MD [facility]  . IR REMOVAL TUN CV CATH W/O FL  11/01/2016  . IR REMOVAL TUN CV CATH W/O FL  01/02/2020  . IR THROMBECTOMY AV FISTULA W/THROMBOLYSIS INC/SHUNT/IMG LEFT Left 01/31/2019  . IR THROMBECTOMY AV FISTULA W/THROMBOLYSIS/PTA INC/SHUNT/IMG LEFT Left 01/27/2019  . IR US GUIDE VASC ACCESS LEFT  11/10/2016  . IR US GUIDE VASC ACCESS LEFT  09/12/2017  . IR US GUIDE VASC ACCESS LEFT  12/12/2017  . IR US GUIDE VASC ACCESS LEFT  06/05/2018  . IR US GUIDE VASC ACCESS LEFT  09/11/2018  . IR US GUIDE VASC ACCESS LEFT  01/27/2019  . IR US GUIDE VASC ACCESS LEFT  02/01/2019  . IR US GUIDE VASC ACCESS RIGHT  02/01/2019  . IR US GUIDE VASC ACCESS RIGHT  07/14/2020     reports that she has never smoked. She has never used smokeless tobacco. She reports previous alcohol use. She reports that she does not use drugs.  No Known Allergies  No family history on file.   Prior to Admission medications   Medication Sig Start Date End Date Taking? Authorizing Provider  acetaminophen (OFIRMEV) 10 MG/ML SOLN Take by mouth. 08/09/19 08/07/20  [provider]  Amino Acids-Protein Hydrolys (FEEDING SUPPLEMENT, PRO-STAT SUGAR FREE 64,) LIQD Take 30 mLs by mouth 2 (two) times daily. 02/17/18   Elgergawy, Silver Huguenin, MD  atorvastatin (LIPITOR) 40 MG tablet Take 1 tablet (40 mg total) by mouth daily. 06/10/20   Mayers, Cari S, PA-C  B Complex-C-Zn-Folic Acid (DIALYVITE 786 WITH ZINC) 0.8 MG TABS Take by mouth. 05/18/20   [provider]  B Complex-C-Zn-Folic Acid (DIALYVITE 754 WITH ZINC) 0.8 MG TABS Take 1 tablet by mouth  daily. 05/18/20   [provider]  Blood Glucose Monitoring Suppl (TRUE METRIX METER) DEVI 1 each by Does not apply route 3 (three) times daily before meals. 07/25/17   [name], MD  cetirizine (ZYRTEC) 10 MG tablet Take 1 tablet (10 mg total) by mouth daily. 06/10/20   Mayers, Cari S, PA-C  cinacalcet (SENSIPAR) 60 MG tablet Take by mouth. 12/11/19   [provider]  doxercalciferol (HECTOROL) 0.5 MCG capsule Doxercalciferol (Hectorol) 02/12/20 02/10/21  [provider]  DULoxetine (CYMBALTA) 60 MG capsule Take 1 capsule (60 mg total) by mouth daily. 06/10/20   Mayers, Cari S, PA-C  ethyl chloride spray SPRAY 1 SPRAY TO THE SKIN 3 TIMES A WEEK AS NEEDED. SPRAY A SMALL AMOUNT JUST PRIOR TO NEEDLE INSERTION. 12/24/19   [name], MD  gabapentin (NEURONTIN) 300 MG capsule Take 1 capsule (300 mg total) by mouth at bedtime. 06/10/20   Mayers, Cari S, PA-C  glucose blood (TRUE METRIX BLOOD GLUCOSE TEST) test  strip Use 3 times daily before meals 10/17/19   [name], MD  insulin glargine (LANTUS) 100 UNIT/ML injection INJECT SUBCUTANEOUSLY TWICE DAILY 20 UNITS IN THE MORNING AND 15 UNITS IN THE EVENING 06/10/20   Mayers, Cari S, PA-C  Insulin Syringe-Needle U-100 (TRUEPLUS INSULIN SYRINGE) 31G X 5/16" 1 ML MISC USE AS DIRECTED 06/10/20   Mayers, Cari S, PA-C  lisinopril (ZESTRIL) 20 MG tablet Take 1 tablet (20 mg total) by mouth daily. 06/10/20   Mayers, Cari S, PA-C  Methoxy PEG-Epoetin Beta (MIRCERA IJ) Mircera 01/13/20 01/11/21  [provider]  mupirocin ointment (BACTROBAN) 2 % Apply 1 application topically 2 (two) times daily. 05/24/20   McDonald, Stephan Minister, DPM  Nutritional Supplements (FEEDING SUPPLEMENT, NEPRO CARB STEADY,) LIQD Take 237 mLs by mouth Every Tuesday,Thursday,and Saturday with dialysis.     [provider]  omeprazole (PRILOSEC) 20 MG capsule Take 1 capsule (20 mg total) by mouth daily. 06/10/20   Mayers, Cari S, PA-C   oxyCODONE-acetaminophen (PERCOCET) 5-325 MG tablet Take 1 tablet by mouth every 6 (six) hours as needed. 10/08/19   Rhyne, Hulen Shouts, PA-C  polyethylene glycol powder (GLYCOLAX/MIRALAX) 17 GM/SCOOP powder Take 17 g by mouth 2 (two) times daily as needed. 06/10/20   Mayers, Loraine Grip, PA-C  SENSIPAR 30 MG tablet SMARTSIG:1 Tablet(s) By Mouth Every Evening 11/04/19   [provider]  sucroferric oxyhydroxide (VELPHORO) 500 MG chewable tablet Chew 500 mg by mouth 2 (two) times daily with a meal.     [provider]  TRUEplus Lancets 28G MISC USE AS DIRECTED THREE TIMES DAILY TO TEST SUGAR BEFORE MEALS 02/11/19   [name], MD    Physical Exam: Vitals:   07/14/20 1500 07/14/20 1636 07/14/20 1715 07/14/20 1751  BP: (!) 198/171 (!) 156/81 (!) 169/71 (!) 169/74  Pulse: 70 79 76 74  Resp: 15 15  16   Temp:      TempSrc:      SpO2: 96% 95% 96% 97%    Constitutional: NAD, calm, comfortable Vitals:   07/14/20 1500 07/14/20 1636 07/14/20 1715 07/14/20 1751  BP: (!) 198/171 (!) 156/81 (!) 169/71 (!) 169/74  Pulse: 70 79 76 74  Resp: 15 15  16   Temp:      TempSrc:      SpO2: 96% 95% 96% 97%   Eyes: PERRL, lids and conjunctivae normal ENMT: Mucous membranes are moist. Posterior pharynx clear of any exudate or lesions.Normal dentition.  Neck: normal, supple, no masses, no thyromegaly Respiratory: clear to auscultation bilaterally, no wheezing, no crackles. Normal respiratory effort. No accessory muscle use.  Cardiovascular: Regular rate and rhythm, no murmurs / rubs / gallops. No extremity edema. 2+ pedal pulses. No carotid bruits.  Abdomen: no tenderness, no masses palpated. No hepatosplenomegaly. Bowel sounds positive.  Musculoskeletal: no clubbing / cyanosis. No joint deformity upper and lower extremities. Good ROM, no contractures. Normal muscle tone.  No bruit felt on the right arm fistula. Skin: no rashes, lesions, ulcers. No induration Neurologic: No facial droop, moving  all limbs, following simple commands ~1 Hz twitching/flapping movement of forearms and shoulders Psychiatric: Oriented to person, confused about time and place  (Anything < 9 systems with 2 bullets each down codes to level 1) (If patient refuses exam can't bill higher level) (Make sure to document decubitus ulcers present on admission -- if possible -- and whether patient has chronic indwelling catheter at time of admission)  Labs on Admission: I have personally reviewed following labs and  imaging studies  CBC: Recent Labs  Lab 07/14/20 1210  WBC 7.0  HGB 11.7*  HCT 39.0  MCV 104.6*  PLT 726   Basic Metabolic Panel: Recent Labs  Lab 07/14/20 1210  NA 143  K 6.5*  CL 102  CO2 20*  GLUCOSE 37*  BUN 132*  CREATININE 17.31*  CALCIUM 9.8   GFR: CrCl cannot be calculated (Unknown ideal weight.). Liver Function Tests: No results for input(s): AST, ALT, ALKPHOS, BILITOT, PROT, ALBUMIN in the last 168 hours. No results for input(s): LIPASE, AMYLASE in the last 168 hours. No results for input(s): AMMONIA in the last 168 hours. Coagulation Profile: No results for input(s): INR, PROTIME in the last 168 hours. Cardiac Enzymes: No results for input(s): CKTOTAL, CKMB, CKMBINDEX, TROPONINI in the last 168 hours. BNP (last 3 results) No results for input(s): PROBNP in the last 8760 hours. HbA1C: No results for input(s): HGBA1C in the last 72 hours. CBG: Recent Labs  Lab 07/14/20 1339 07/14/20 1531 07/14/20 1728  GLUCAP 52* 126* 102*   Lipid Profile: No results for input(s): CHOL, HDL, LDLCALC, TRIG, CHOLHDL, LDLDIRECT in the last 72 hours. Thyroid Function Tests: No results for input(s): TSH, T4TOTAL, FREET4, T3FREE, THYROIDAB in the last 72 hours. Anemia Panel: No results for input(s): VITAMINB12, FOLATE, FERRITIN, TIBC, IRON, RETICCTPCT in the last 72 hours. Urine analysis:    Component Value Date/Time   COLORURINE YELLOW 02/10/2018 2342   APPEARANCEUR CLOUDY (A)  02/10/2018 2342   LABSPEC 1.012 02/10/2018 2342   PHURINE 8.0 02/10/2018 2342   GLUCOSEU >=500 (A) 02/10/2018 2342   HGBUR MODERATE (A) 02/10/2018 2342   BILIRUBINUR NEGATIVE 02/10/2018 2342   BILIRUBINUR small 03/27/2017 1051   KETONESUR NEGATIVE 02/10/2018 2342   PROTEINUR 100 (A) 02/10/2018 2342   UROBILINOGEN 0.2 03/27/2017 1051   UROBILINOGEN 0.2 12/31/2008 1556   NITRITE NEGATIVE 02/10/2018 2342   LEUKOCYTESUR LARGE (A) 02/10/2018 2342    Radiological Exams on Admission: IR Fluoro Guide CV Line Right  Result Date: 07/14/2020 INDICATION: 70 year old female with end-stage renal disease. EXAM: NON-TUNNELED CENTRAL VENOUS HEMODIALYSIS CATHETER PLACEMENT WITH ULTRASOUND AND FLUOROSCOPIC GUIDANCE COMPARISON:  None. MEDICATIONS: None FLUOROSCOPY TIME:  0.1 minutes, (0 mGy) COMPLICATIONS: None immediate. PROCEDURE: Informed written consent was obtained from the patient after a discussion of the risks, benefits, and alternatives to treatment. Questions regarding the procedure were encouraged and answered. The right neck and chest were prepped with chlorhexidine in a sterile fashion, and a sterile drape was applied covering the operative field. Maximum barrier sterile technique with sterile gowns and gloves were used for the procedure. A timeout was performed prior to the initiation of the procedure. After the overlying soft tissues were anesthetized, a small venotomy incision was created and a micropuncture kit was utilized to access the external jugular vein. Real-time ultrasound guidance was utilized for vascular access including the acquisition of a permanent ultrasound image documenting patency of the accessed vessel. The microwire was utilized to measure appropriate catheter length. A stiff glidewire was advanced to the level of the IVC. Under fluoroscopic guidance, the venotomy was serially dilated, ultimately allowing placement of a 16 cm temporary Trialysis catheter with tip ultimately  terminating within the superior aspect of the right atrium. Final catheter positioning was confirmed and documented with a spot radiographic image. The catheter aspirates and flushes normally. The catheter was flushed with appropriate volume heparin dwells. The catheter exit site was secured with a 0-Prolene retention suture. A dressing was placed. The patient tolerated the  procedure well without immediate post procedural complication. IMPRESSION: Successful placement of a right external jugular approach 16 cm temporary dialysis catheter with tip terminating with in the superior aspect of the right atrium. The catheter is ready for immediate use. PLAN: This catheter may be converted to a tunneled dialysis catheter at a later date as indicated. Ruthann Cancer, MD [facility] Radiology Electronically Signed   By: Ruthann Cancer MD   On: 07/14/2020 17:49   IR US Guide Vasc Access Right  Result Date: 07/14/2020 INDICATION: 70 year old female with end-stage renal disease. EXAM: NON-TUNNELED CENTRAL VENOUS HEMODIALYSIS CATHETER PLACEMENT WITH ULTRASOUND AND FLUOROSCOPIC GUIDANCE COMPARISON:  None. MEDICATIONS: None FLUOROSCOPY TIME:  0.1 minutes, (0 mGy) COMPLICATIONS: None immediate. PROCEDURE: Informed written consent was obtained from the patient after a discussion of the risks, benefits, and alternatives to treatment. Questions regarding the procedure were encouraged and answered. The right neck and chest were prepped with chlorhexidine in a sterile fashion, and a sterile drape was applied covering the operative field. Maximum barrier sterile technique with sterile gowns and gloves were used for the procedure. A timeout was performed prior to the initiation of the procedure. After the overlying soft tissues were anesthetized, a small venotomy incision was created and a micropuncture kit was utilized to access the external jugular vein. Real-time ultrasound guidance was  utilized for vascular access including the acquisition of a permanent ultrasound image documenting patency of the accessed vessel. The microwire was utilized to measure appropriate catheter length. A stiff glidewire was advanced to the level of the IVC. Under fluoroscopic guidance, the venotomy was serially dilated, ultimately allowing placement of a 16 cm temporary Trialysis catheter with tip ultimately terminating within the superior aspect of the right atrium. Final catheter positioning was confirmed and documented with a spot radiographic image. The catheter aspirates and flushes normally. The catheter was flushed with appropriate volume heparin dwells. The catheter exit site was secured with a 0-Prolene retention suture. A dressing was placed. The patient tolerated the procedure well without immediate post procedural complication. IMPRESSION: Successful placement of a right external jugular approach 16 cm temporary dialysis catheter with tip terminating with in the superior aspect of the right atrium. The catheter is ready for immediate use. PLAN: This catheter may be converted to a tunneled dialysis catheter at a later date as indicated. Ruthann Cancer, MD [facility] Radiology Electronically Signed   By: Ruthann Cancer MD   On: 07/14/2020 17:49    EKG: Independently reviewed.  Tented T waves compared to before, otherwise chronic incomplete RBBB and borderline QTC prolongation.  Assessment/Plan Active Problems:   Uremia   Hyperkalemia  (please populate well all problems here in Problem List. (For example, if patient is on BP meds at home and you resume or decide to hold them, it is a problem that needs to be her. Same for CAD, COPD, HLD and so on)  Hyperkalemia -Received cocktail of insulin, glucose and calcium gluconate -IR placed new subclavian HD line, emergent dialysis this evening.  Acute metabolic/uremic encephalopathy -Emergency dialysis  indicated.  Hypoglycemia -Improved -Discontinue long-acting insulin -Sliding scale for now -A1c last month 8.4  Asterixis -Likely from extremely high uremia -Emergency dialysis and then reevaluate  HTN -Discontinue ARB -As needed hydralazine   DVT prophylaxis: Heparin subcu  code Status: Full code Family Communication: Son at bedside Disposition Plan: Expect the patient will received HD today and tomorrow, expect more than 2 midnight hospital stay. Consults  called: IR and nephrology Admission status: PCU   Lequita Halt MD [facility] Pager [phone]  07/14/2020, 6:09 PM

## 2020-07-14 NOTE — ED Notes (Signed)
Pt transported to IR 

## 2020-07-14 NOTE — ED Triage Notes (Signed)
Pt's last dialysis was last Thursday. Pt not able to go to dialysis because her fistula is not working. Appointment to address same is tomorrow, but unable to wait until then d/t weakness.

## 2020-07-14 NOTE — Consult Note (Signed)
Renal Service Consult Note [facility]  Grady Gutierrez-Gonzalez 07/14/2020 Sol Blazing, MD Requesting Physician: Dr Ron Parker, E.   Reason for Consult: ESRD pt w/ clotted AVF and AMS HPI: The patient is a 70 y.o. year-old w/ hx of thyroid disease, sepsis, pNA, HTN, HL, ESRD on HD, DM who presented today w/ worsening gen weakness, jerking of head and shoulders, missed HD due to AVF clotting. In ED pt was lethargic but responds to commands. K= 6.5 BUN 132  Creat 17.  Hb 11.7.  COVID negative.  Asked to see for dialysis.   Pt seen in ED.  Pt is poor historian, she is lethargic. She knows where she is though. Moves arms and legs to command.   Son at bedside.    ROS  denies CP  no joint pain   no HA  no blurry vision  no rash  no diarrhea  no nausea/ vomiting   Past Medical History  Past Medical History:  Diagnosis Date  . Diabetes mellitus   . ESRD (end stage renal disease) ([place])    dialysis  . Hyperlipidemia   . Hypertension   . Iron deficiency anemia   . MRSA infection    hx of  . Neuropathy    Diabetic  . Pneumonia 07/2016  . Renal disorder   . Seizure-like activity ([place])   . Sepsis ([place])   . Thyroid disease   . Wears dentures    Past Surgical History  Past Surgical History:  Procedure Laterality Date  . AMPUTATION Left 05/21/2015   Procedure: Left Foot 3rd Ray Amputation;  Surgeon: Newt Minion, MD;  Location: [place];  Service: Orthopedics;  Laterality: Left;  . AV FISTULA PLACEMENT Left 03/28/2016   Procedure: ARTERIOVENOUS (AV) FISTULA CREATION LEFT ARM;  Surgeon: Waynetta Sandy, MD;  Location: [place];  Service: Vascular;  Laterality: Left;  . AV FISTULA PLACEMENT Right 06/04/2019   Procedure: ARTERIOVENOUS (AV) FISTULA CREATION RIGHT ARM;  Surgeon: Waynetta Sandy, MD;  Location: [place];  Service: Vascular;  Laterality: Right;  . DILATION AND CURETTAGE OF UTERUS    . EYE SURGERY    . FISTULA SUPERFICIALIZATION Left 09/04/2016    Procedure: FISTULA SUPERFICIALIZATION LEFT UPPER ARM;  Surgeon: Waynetta Sandy, MD;  Location: [place];  Service: Vascular;  Laterality: Left;  . FISTULA SUPERFICIALIZATION Right 10/08/2019   Procedure: FISTULA SUPERFICIALIZATION OR RIGHT ARM;  Surgeon: Waynetta Sandy, MD;  Location: [place];  Service: Vascular;  Laterality: Right;  . IR AV DIALY SHUNT INTRO NEEDLE/INTRAC INITIAL W/PTA/STENT/IMG LT Left 06/13/2017  . IR AV DIALY SHUNT INTRO NEEDLE/INTRAC INITIAL W/PTA/STENT/IMG LT Left 09/12/2017  . IR AV DIALY SHUNT INTRO NEEDLE/INTRACATH INITIAL W/PTA/IMG LEFT  11/10/2016  . IR AV DIALY SHUNT INTRO NEEDLE/INTRACATH INITIAL W/PTA/IMG LEFT  03/09/2017  . IR AV DIALY SHUNT INTRO NEEDLE/INTRACATH INITIAL W/PTA/IMG LEFT  12/12/2017  . IR AV DIALY SHUNT INTRO NEEDLE/INTRACATH INITIAL W/PTA/IMG LEFT  02/27/2018  . IR AV DIALY SHUNT INTRO NEEDLE/INTRACATH INITIAL W/PTA/IMG LEFT  06/05/2018  . IR AV DIALY SHUNT INTRO NEEDLE/INTRACATH INITIAL W/PTA/IMG LEFT  09/11/2018  . IR AV DIALY SHUNT INTRO NEEDLE/INTRACATH INITIAL W/PTA/IMG RIGHT Right 04/21/2020  . IR DIALY SHUNT INTRO NEEDLE/INTRACATH INITIAL W/IMG LEFT Left 05/08/2018  . IR FLUORO GUIDE CV LINE RIGHT  02/01/2019  . IR GENERIC HISTORICAL  08/01/2016   IR FLUORO GUIDE CV LINE RIGHT 08/01/2016 [name], MD [facility]  . IR GENERIC HISTORICAL  08/01/2016   IR  US GUIDE VASC ACCESS RIGHT 08/01/2016 [name], MD [facility]  . IR REMOVAL TUN CV CATH W/O FL  11/01/2016  . IR REMOVAL TUN CV CATH W/O FL  01/02/2020  . IR THROMBECTOMY AV FISTULA W/THROMBOLYSIS INC/SHUNT/IMG LEFT Left 01/31/2019  . IR THROMBECTOMY AV FISTULA W/THROMBOLYSIS/PTA INC/SHUNT/IMG LEFT Left 01/27/2019  . IR US GUIDE VASC ACCESS LEFT  11/10/2016  . IR US GUIDE VASC ACCESS LEFT  09/12/2017  . IR US GUIDE VASC ACCESS LEFT  12/12/2017  . IR US GUIDE VASC ACCESS LEFT  06/05/2018  . IR US GUIDE VASC ACCESS LEFT  09/11/2018  . IR US GUIDE VASC ACCESS LEFT  01/27/2019  . IR US GUIDE  VASC ACCESS LEFT  02/01/2019  . IR US GUIDE VASC ACCESS RIGHT  02/01/2019   Family History No family history on file. Social History  reports that she has never smoked. She has never used smokeless tobacco. She reports previous alcohol use. She reports that she does not use drugs. Allergies No Known Allergies Home medications Prior to Admission medications   Medication Sig Start Date End Date Taking? Authorizing Provider  acetaminophen (OFIRMEV) 10 MG/ML SOLN Take by mouth. 08/09/19 08/07/20  [provider]  Amino Acids-Protein Hydrolys (FEEDING SUPPLEMENT, PRO-STAT SUGAR FREE 64,) LIQD Take 30 mLs by mouth 2 (two) times daily. 02/17/18   Elgergawy, Silver Huguenin, MD  atorvastatin (LIPITOR) 40 MG tablet Take 1 tablet (40 mg total) by mouth daily. 06/10/20   Mayers, Cari S, PA-C  B Complex-C-Zn-Folic Acid (DIALYVITE 497 WITH ZINC) 0.8 MG TABS Take by mouth. 05/18/20   [provider]  B Complex-C-Zn-Folic Acid (DIALYVITE 026 WITH ZINC) 0.8 MG TABS Take 1 tablet by mouth daily. 05/18/20   [provider]  Blood Glucose Monitoring Suppl (TRUE METRIX METER) DEVI 1 each by Does not apply route 3 (three) times daily before meals. 07/25/17   [name], MD  cetirizine (ZYRTEC) 10 MG tablet Take 1 tablet (10 mg total) by mouth daily. 06/10/20   Mayers, Cari S, PA-C  cinacalcet (SENSIPAR) 60 MG tablet Take by mouth. 12/11/19   [provider]  doxercalciferol (HECTOROL) 0.5 MCG capsule Doxercalciferol (Hectorol) 02/12/20 02/10/21  [provider]  DULoxetine (CYMBALTA) 60 MG capsule Take 1 capsule (60 mg total) by mouth daily. 06/10/20   Mayers, Cari S, PA-C  ethyl chloride spray SPRAY 1 SPRAY TO THE SKIN 3 TIMES A WEEK AS NEEDED. SPRAY A SMALL AMOUNT JUST PRIOR TO NEEDLE INSERTION. 12/24/19   [name], MD  gabapentin (NEURONTIN) 300 MG capsule Take 1 capsule (300 mg total) by mouth at bedtime. 06/10/20   Mayers, Cari S, PA-C  glucose blood (TRUE METRIX BLOOD GLUCOSE  TEST) test strip Use 3 times daily before meals 10/17/19   [name], MD  insulin glargine (LANTUS) 100 UNIT/ML injection INJECT SUBCUTANEOUSLY TWICE DAILY 20 UNITS IN THE MORNING AND 15 UNITS IN THE EVENING 06/10/20   Mayers, Cari S, PA-C  Insulin Syringe-Needle U-100 (TRUEPLUS INSULIN SYRINGE) 31G X 5/16" 1 ML MISC USE AS DIRECTED 06/10/20   Mayers, Cari S, PA-C  lisinopril (ZESTRIL) 20 MG tablet Take 1 tablet (20 mg total) by mouth daily. 06/10/20   Mayers, Cari S, PA-C  Methoxy PEG-Epoetin Beta (MIRCERA IJ) Mircera 01/13/20 01/11/21  [provider]  mupirocin ointment (BACTROBAN) 2 % Apply 1 application topically 2 (two) times daily. 05/24/20   McDonald, Stephan Minister, DPM  Nutritional Supplements (FEEDING SUPPLEMENT, NEPRO CARB STEADY,) LIQD Take 237 mLs by  mouth Every Tuesday,Thursday,and Saturday with dialysis.     [provider]  omeprazole (PRILOSEC) 20 MG capsule Take 1 capsule (20 mg total) by mouth daily. 06/10/20   Mayers, Cari S, PA-C  oxyCODONE-acetaminophen (PERCOCET) 5-325 MG tablet Take 1 tablet by mouth every 6 (six) hours as needed. 10/08/19   Rhyne, Hulen Shouts, PA-C  polyethylene glycol powder (GLYCOLAX/MIRALAX) 17 GM/SCOOP powder Take 17 g by mouth 2 (two) times daily as needed. 06/10/20   Mayers, Loraine Grip, PA-C  SENSIPAR 30 MG tablet SMARTSIG:1 Tablet(s) By Mouth Every Evening 11/04/19   [provider]  sucroferric oxyhydroxide (VELPHORO) 500 MG chewable tablet Chew 500 mg by mouth 2 (two) times daily with a meal.     [provider]  TRUEplus Lancets 28G MISC USE AS DIRECTED THREE TIMES DAILY TO TEST SUGAR BEFORE MEALS 02/11/19   [name], MD     Vitals:   07/14/20 1349 07/14/20 1415 07/14/20 1445 07/14/20 1500  BP: (!) 161/73 (!) 184/96 (!) 188/52 (!) 198/171  Pulse: 68 70 68 70  Resp: 18 16 13 15   Temp:      TempSrc:      SpO2: 97% 98% 95% 96%   Exam Gen mild mod jerking of head and shoulders, myoclonic No rash, cyanosis or  gangrene Sclera anicteric, throat clear No jvd or bruits Chest clear bilat RRR no MRG Abd soft ntnd no mass or ascites +bs GU defer MS no joint effusions or deformity Ext no leg or UE edema, no wounds or ulcers Neuro is lethargic, arouses easily, follows commands moveing all ext x 4 equally, +asterixis RUA AVF no bruit; old LUA AVF no bruit    Home meds:  - neurontin/ cymbalta/ percocet prn  - velphoro 500 bid ac/ sensipar 60 qd  - lipitor 40/ prilosec qd  - prn's/ vitamins/ supplements    OP HD: [place] [facility]   4h  62.5kg  2/2 bath RUA AVF  Hep 2000  - mircera 50 q2 last 1/4  - venofer 50 qwk  - hect 5 ug tiw   Assessment/ Plan: 1. AMS - due to uremia from missed HD x 2. Have consulted IR for temp cath. Plan HD tonight after access placement.  If myoclonic jerking gets worse consider giving cautious small doses of IV ativan 0.25 - 0.5mg  at a time. Once pt is dialyzed this should resolve, or at least sig improve. Hold neurontin.  2. Clotted AVF - consult IR for declot when more stable.  3. ESRD - on HD TTS. Missed 1 , maybe 2 sessions.  As above.  4. BP/ volume - BP's high, prob volume is up, UF 2-3 L w/ HD. No BP meds at home 5. Anemia ckd - follow Hb, had esa 1 wk ago 6. MBD ckd - cont meds      Kelly Splinter  MD 07/14/2020, 4:34 PM  Recent Labs  Lab 07/14/20 1210  WBC 7.0  HGB 11.7*   Recent Labs  Lab 07/14/20 1210  K 6.5*  BUN 132*  CREATININE 17.31*  CALCIUM 9.8

## 2020-07-14 NOTE — ED Notes (Signed)
Dr. Marlowe Sax paged in regards to pt's potassium - 6.9.

## 2020-07-14 NOTE — Procedures (Signed)
Interventional Radiology Procedure Note  Procedure: Temporary hemodialysis catheter placement  Findings: Please refer to procedural dictation for full description. Right IJ diminutive, catheter placed in right EJ.  16 cm Trialysis.  Tip in right atrium, ready for immediate use.  Complications: None immediate  Estimated Blood Loss: < 5 mL  Recommendations: Catheter ready for immediate use.   Ruthann Cancer, MD Pager: [phone]

## 2020-07-14 NOTE — Progress Notes (Incomplete)
Received report @ 1819 from [facility] Patient has yet to arrive to unit for dialysis.Still awaiting patient after several calls to ED

## 2020-07-14 NOTE — ED Notes (Signed)
Pt transported to dialysis

## 2020-07-14 NOTE — ED Notes (Signed)
Pt returned from IV

## 2020-07-14 NOTE — ED Notes (Signed)
S/W Dr. Marlowe Sax in regards to pt's potassium of 6.9. No new orders received at this time.

## 2020-07-14 NOTE — ED Notes (Signed)
Gave pt apple juice

## 2020-07-15 ENCOUNTER — Inpatient Hospital Stay (HOSPITAL_COMMUNITY): Payer: Medicaid Other

## 2020-07-15 ENCOUNTER — Encounter (HOSPITAL_COMMUNITY): Payer: Self-pay | Admitting: Internal Medicine

## 2020-07-15 ENCOUNTER — Other Ambulatory Visit: Payer: Self-pay | Admitting: Radiology

## 2020-07-15 DIAGNOSIS — N19 Unspecified kidney failure: Secondary | ICD-10-CM

## 2020-07-15 LAB — RENAL FUNCTION PANEL
Albumin: 2.7 g/dL — ABNORMAL LOW (ref 3.5–5.0)
Anion gap: 13 (ref 5–15)
BUN: 37 mg/dL — ABNORMAL HIGH (ref 8–23)
CO2: 27 mmol/L (ref 22–32)
Calcium: 9 mg/dL (ref 8.9–10.3)
Chloride: 97 mmol/L — ABNORMAL LOW (ref 98–111)
Creatinine, Ser: 7.86 mg/dL — ABNORMAL HIGH (ref 0.44–1.00)
GFR, Estimated: 5 mL/min — ABNORMAL LOW (ref >60)
Glucose, Bld: 146 mg/dL — ABNORMAL HIGH (ref 70–99)
Phosphorus: 4 mg/dL (ref 2.5–4.6)
Potassium: 5 mmol/L (ref 3.5–5.1)
Sodium: 137 mmol/L (ref 135–145)

## 2020-07-15 LAB — BLOOD GAS, ARTERIAL
Acid-Base Excess: 3.4 mmol/L — ABNORMAL HIGH (ref 0.0–2.0)
Bicarbonate: 27.6 mmol/L (ref 20.0–28.0)
Drawn by: 51147
FIO2: 21
O2 Saturation: 90.1 %
Patient temperature: 36.9
pCO2 arterial: 42.6 mmHg (ref 32.0–48.0)
pH, Arterial: 7.426 (ref 7.350–7.450)
pO2, Arterial: 67.5 mmHg — ABNORMAL LOW (ref 83.0–108.0)

## 2020-07-15 LAB — AMMONIA: Ammonia: 23 umol/L (ref 9–35)

## 2020-07-15 LAB — CBC
HCT: 31.9 % — ABNORMAL LOW (ref 36.0–46.0)
Hemoglobin: 10 g/dL — ABNORMAL LOW (ref 12.0–15.0)
MCH: 31.5 pg (ref 26.0–34.0)
MCHC: 31.3 g/dL (ref 30.0–36.0)
MCV: 100.6 fL — ABNORMAL HIGH (ref 80.0–100.0)
Platelets: 180 10*3/uL (ref 150–400)
RBC: 3.17 MIL/uL — ABNORMAL LOW (ref 3.87–5.11)
RDW: 14.9 % (ref 11.5–15.5)
WBC: 5.3 10*3/uL (ref 4.0–10.5)
nRBC: 0 % (ref 0.0–0.2)

## 2020-07-15 LAB — GLUCOSE, CAPILLARY
Glucose-Capillary: 136 mg/dL — ABNORMAL HIGH (ref 70–99)
Glucose-Capillary: 155 mg/dL — ABNORMAL HIGH (ref 70–99)
Glucose-Capillary: 164 mg/dL — ABNORMAL HIGH (ref 70–99)
Glucose-Capillary: 124 mg/dL — ABNORMAL HIGH (ref 70–99)
Glucose-Capillary: 143 mg/dL — ABNORMAL HIGH (ref 70–99)

## 2020-07-15 LAB — HEPATITIS B CORE ANTIBODY, TOTAL: Hep B Core Total Ab: REACTIVE — AB

## 2020-07-15 LAB — POTASSIUM
Potassium: 5.9 mmol/L — ABNORMAL HIGH (ref 3.5–5.1)
Potassium: 5.7 mmol/L — ABNORMAL HIGH (ref 3.5–5.1)

## 2020-07-15 MED ORDER — HEPARIN SODIUM (PORCINE) 1000 UNIT/ML DIALYSIS: 2000.0000 [IU] | Freq: Once | INTRAMUSCULAR | Status: DC

## 2020-07-15 MED ORDER — HYDRALAZINE HCL 20 MG/ML IJ SOLN
10.0000 mg | Freq: Four times a day (QID) | INTRAMUSCULAR | Status: DC | PRN
  Administered 2020-07-15 – 2020-07-16 (×2): 10 mg via INTRAVENOUS
  Filled 2020-07-15 (×2): qty 1

## 2020-07-15 MED ORDER — DOXERCALCIFEROL 4 MCG/2ML IV SOLN
5.0000 ug | INTRAVENOUS | Status: DC
  Filled 2020-07-15: qty 4

## 2020-07-15 MED ORDER — COLLAGENASE 250 UNIT/GM EX OINT
TOPICAL_OINTMENT | Freq: Every day | CUTANEOUS | Status: DC
  Filled 2020-07-15: qty 30

## 2020-07-15 MED FILL — LISINOPRIL 20 MG TABLET: 20 | 30 days supply | Qty: 30 | Fill #0

## 2020-07-15 NOTE — Progress Notes (Signed)
[facility] Progress Note  Subjective: pt seen in room, in mittens. Had HD last night, no fluid off d/t BP drop.   Vitals:   07/15/20 0427 07/15/20 0600 07/15/20 0623 07/15/20 0808  BP: (!) 176/83   (!) 119/91  Pulse: 74   69  Resp: 16   19  Temp: 98.4 F (36.9 C)   (!) 97.4 F (36.3 C)  TempSrc: Oral   Axillary  SpO2: 96% 95% 100% 99%  Weight: 63.5 kg       Exam:   Lethargic, asterixis/ myoclonus resolved  Pt tearful, hard to understand, has mittens on  no jvd  Chest cta bilat  Cor reg no RG  Abd soft ntnd no ascites   Ext no LE edema   Alert, NF   RUA AVF no bruit   Temp cath R EJ in place   Home meds:  - neurontin/ cymbalta/ percocet prn  - velphoro 500 bid ac/ sensipar 60 qd  - lipitor 40/ prilosec qd  - prn's/ vitamins/ supplements    OP HD: [place] [facility]   4h  62.5kg  2/2 bath RUA AVF  Hep 2000  - mircera 50 q2 last 1/4  - venofer 50 qwk  - hect 5 ug tiw   Assessment/ Plan: 1. AMS - due to uremia from clotted access and missed HD x 2. Temp cath placed 1/12 by IR, appreciate assistance. Had HD yest, looks much better w/ myoclonic jerking resolved. Has underlying dementia. Would consider palliative team for [place] meeting w/ family. Will d/w pmd. Will consult IR for clotted AVF.   2. Clotted AVF - as above. Has temp cath now.  3. ESRD - on HD TTS. Missed 2 HD d/t clotted access. HD tomorrow off sched then HD again on Sat.  4. BP/ volume - no vol on, BP's dropped w/ attempted UF yest. 1 kg up by wts.   5. 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 Cont vdra, sensipar, binder   Rob Braelyn Bordonaro 07/15/2020, 11:32 AM   Recent Labs  Lab 07/14/20 1210 07/14/20 1932 07/14/20 2122 07/15/20 0332 07/15/20 0427  K 6.5*   < > 6.5*  --  5.0  BUN 132*  --  129*  --  37*  CREATININE 17.31*  --  17.12*  --  7.86*  CALCIUM 9.8  --  9.4  --  9.0  PHOS  --   --  6.8*  --  4.0  HGB 11.7*  --   --  10.0*  --    < > = values  in this interval not displayed.   Inpatient medications: . atorvastatin  40 mg Oral Daily  . Chlorhexidine Gluconate Cloth  6 each Topical Q0600  . cinacalcet  60 mg Oral Q breakfast  . collagenase   Topical Daily  . DULoxetine  60 mg Oral Daily  . feeding supplement (NEPRO CARB STEADY)  237 mL Oral Q T,Th,Sa-HD  . heparin  5,000 Units Subcutaneous Q12H  . insulin aspart  0-6 Units Subcutaneous TID WC  . loratadine  10 mg Oral Daily  . mupirocin ointment  1 application Topical BID  . pantoprazole  40 mg Oral Daily  . sucroferric oxyhydroxide  500 mg Oral BID WC    heparin, hydrALAZINE, ondansetron **OR** ondansetron (ZOFRAN) IV

## 2020-07-15 NOTE — ED Notes (Signed)
Son made aware of patient's return and room number.

## 2020-07-15 NOTE — Progress Notes (Signed)
Referring Physician(s): Schertz,R  Supervising Physician: [name]  Patient Status:  [facility] - In-pt  Chief Complaint:  Renal failure, clotted dialysis fistula  Subjective: Patient familiar to IR service for multiple vascular access procedures, latest a temp dialysis catheter placement 07/14/2020.  She presents now with clotted right upper extremity AV fistula.  On 06/02/2020 she underwent angioplasty of high-grade focal stenosis of the peripheral cephalic vein outflow .  The arterial anastomosis was patent at the time, though small caliber. She was originally scheduled for fistula declotting as an outpatient here tomorrow, but she was admitted on 1/12 with increasing lethargy, nausea following some missed dialysis appointments.  Request now received from nephrology to proceed with fistula declot tomorrow.  Patient currently somnolent.  Son in room.  No reported fevers, worsening respiratory issues, or bleeding.  CT head done today revealed no acute abnormality but advanced chronic ischemic disease.  Past Medical History:  Diagnosis Date  . Diabetes mellitus   . ESRD (end stage renal disease) ([place])    dialysis  . Hyperlipidemia   . Hypertension   . Iron deficiency anemia   . MRSA infection    hx of  . Neuropathy    Diabetic  . Pneumonia 07/2016  . Renal disorder   . Seizure-like activity ([place])   . Sepsis ([place])   . Thyroid disease   . Wears dentures    Past Surgical History:  Procedure Laterality Date  . AMPUTATION Left 05/21/2015   Procedure: Left Foot 3rd Ray Amputation;  Surgeon: Newt Minion, MD;  Location: [place];  Service: Orthopedics;  Laterality: Left;  . AV FISTULA PLACEMENT Left 03/28/2016   Procedure: ARTERIOVENOUS (AV) FISTULA CREATION LEFT ARM;  Surgeon: Waynetta Sandy, MD;  Location: [place];  Service: Vascular;  Laterality: Left;  . AV FISTULA PLACEMENT Right 06/04/2019   Procedure: ARTERIOVENOUS (AV) FISTULA CREATION RIGHT ARM;  Surgeon: Waynetta Sandy, MD;  Location: [place];  Service: Vascular;  Laterality: Right;  . DILATION AND CURETTAGE OF UTERUS    . EYE SURGERY    . FISTULA SUPERFICIALIZATION Left 09/04/2016   Procedure: FISTULA SUPERFICIALIZATION LEFT UPPER ARM;  Surgeon: Waynetta Sandy, MD;  Location: [place];  Service: Vascular;  Laterality: Left;  . FISTULA SUPERFICIALIZATION Right 10/08/2019   Procedure: FISTULA SUPERFICIALIZATION OR RIGHT ARM;  Surgeon: Waynetta Sandy, MD;  Location: [place];  Service: Vascular;  Laterality: Right;  . IR AV DIALY SHUNT INTRO NEEDLE/INTRAC INITIAL W/PTA/STENT/IMG LT Left 06/13/2017  . IR AV DIALY SHUNT INTRO NEEDLE/INTRAC INITIAL W/PTA/STENT/IMG LT Left 09/12/2017  . IR AV DIALY SHUNT INTRO NEEDLE/INTRACATH INITIAL W/PTA/IMG LEFT  11/10/2016  . IR AV DIALY SHUNT INTRO NEEDLE/INTRACATH INITIAL W/PTA/IMG LEFT  03/09/2017  . IR AV DIALY SHUNT INTRO NEEDLE/INTRACATH INITIAL W/PTA/IMG LEFT  12/12/2017  . IR AV DIALY SHUNT INTRO NEEDLE/INTRACATH INITIAL W/PTA/IMG LEFT  02/27/2018  . IR AV DIALY SHUNT INTRO NEEDLE/INTRACATH INITIAL W/PTA/IMG LEFT  06/05/2018  . IR AV DIALY SHUNT INTRO NEEDLE/INTRACATH INITIAL W/PTA/IMG LEFT  09/11/2018  . IR AV DIALY SHUNT INTRO NEEDLE/INTRACATH INITIAL W/PTA/IMG RIGHT Right 04/21/2020  . IR DIALY SHUNT INTRO NEEDLE/INTRACATH INITIAL W/IMG LEFT Left 05/08/2018  . IR FLUORO GUIDE CV LINE RIGHT  02/01/2019  . IR FLUORO GUIDE CV LINE RIGHT  07/14/2020  . IR GENERIC HISTORICAL  08/01/2016   IR FLUORO GUIDE CV LINE RIGHT 08/01/2016 [name], MD [facility]  . IR GENERIC HISTORICAL  08/01/2016   IR US GUIDE VASC  ACCESS RIGHT 08/01/2016 [name], MD [facility]  . IR REMOVAL TUN CV CATH W/O FL  11/01/2016  . IR REMOVAL TUN CV CATH W/O FL  01/02/2020  . IR THROMBECTOMY AV FISTULA W/THROMBOLYSIS INC/SHUNT/IMG LEFT Left 01/31/2019  . IR THROMBECTOMY AV FISTULA W/THROMBOLYSIS/PTA INC/SHUNT/IMG LEFT Left 01/27/2019  . IR US GUIDE VASC ACCESS LEFT  11/10/2016  . IR US  GUIDE VASC ACCESS LEFT  09/12/2017  . IR US GUIDE VASC ACCESS LEFT  12/12/2017  . IR US GUIDE VASC ACCESS LEFT  06/05/2018  . IR US GUIDE VASC ACCESS LEFT  09/11/2018  . IR US GUIDE VASC ACCESS LEFT  01/27/2019  . IR US GUIDE VASC ACCESS LEFT  02/01/2019  . IR US GUIDE VASC ACCESS RIGHT  02/01/2019  . IR US GUIDE VASC ACCESS RIGHT  07/14/2020      Allergies: Patient has no known allergies.  Medications: Prior to Admission medications   Medication Sig Start Date End Date Taking? Authorizing Provider  atorvastatin (LIPITOR) 40 MG tablet Take 1 tablet (40 mg total) by mouth daily. 06/10/20  Yes Mayers, Cari S, PA-C  B Complex-C-Zn-Folic Acid (DIALYVITE 053 WITH ZINC) 0.8 MG TABS Take 1 tablet by mouth daily. 05/18/20  Yes [provider]  Blood Glucose Monitoring Suppl (TRUE METRIX METER) DEVI 1 each by Does not apply route 3 (three) times daily before meals. 07/25/17  Yes [name], MD  cetirizine (ZYRTEC) 10 MG tablet Take 1 tablet (10 mg total) by mouth daily. 06/10/20  Yes Mayers, Cari S, PA-C  cinacalcet (SENSIPAR) 60 MG tablet Take 60 mg by mouth daily. 12/11/19  Yes [provider]  doxercalciferol (HECTOROL) 0.5 MCG capsule Take 0.5 mcg by mouth 3 (three) times a week. 02/12/20 02/10/21 Yes [provider]  DULoxetine (CYMBALTA) 60 MG capsule Take 1 capsule (60 mg total) by mouth daily. 06/10/20  Yes Mayers, Cari S, PA-C  ethyl chloride spray SPRAY 1 SPRAY TO THE SKIN 3 TIMES A WEEK AS NEEDED. SPRAY A SMALL AMOUNT JUST PRIOR TO NEEDLE INSERTION. Patient taking differently: Apply 1 application topically 3 (three) times a week. Spray a small amount prior to needle insertion at dialysis. 12/24/19  Yes [name], MD  gabapentin (NEURONTIN) 300 MG capsule Take 1 capsule (300 mg total) by mouth at bedtime. 06/10/20  Yes Mayers, Cari S, PA-C  glucose blood (TRUE METRIX BLOOD GLUCOSE TEST) test strip Use 3 times daily before meals 10/17/19  Yes Newlin, [name], MD   insulin glargine (LANTUS) 100 UNIT/ML injection INJECT SUBCUTANEOUSLY TWICE DAILY 20 UNITS IN THE MORNING AND 15 UNITS IN THE EVENING Patient taking differently: Inject 15-20 Units into the skin See admin instructions. INJECT SUBCUTANEOUSLY 20 UNITS IN THE MORNING AND 15 UNITS IN THE EVENING 06/10/20  Yes Mayers, Cari S, PA-C  Insulin Syringe-Needle U-100 (TRUEPLUS INSULIN SYRINGE) 31G X 5/16" 1 ML MISC USE AS DIRECTED 06/10/20  Yes Mayers, Cari S, PA-C  Methoxy PEG-Epoetin Beta (MIRCERA IJ) Mircera 01/13/20 01/11/21 Yes [provider]  mupirocin ointment (BACTROBAN) 2 % Apply 1 application topically 2 (two) times daily. 05/24/20  Yes McDonald, Stephan Minister, DPM  Nutritional Supplements (FEEDING SUPPLEMENT, NEPRO CARB STEADY,) LIQD Take 237 mLs by mouth Every Tuesday,Thursday,and Saturday with dialysis.    Yes [provider]  omeprazole (PRILOSEC) 20 MG capsule Take 1 capsule (20 mg total) by mouth daily. 06/10/20  Yes Mayers, Cari S, PA-C  sucroferric oxyhydroxide (VELPHORO) 500 MG chewable tablet Chew 500 mg by mouth 2 (  two) times daily with a meal.    Yes [provider]  TRUEplus Lancets 28G MISC USE AS DIRECTED THREE TIMES DAILY TO TEST SUGAR BEFORE MEALS Patient taking differently: 1 each by Other route 3 (three) times daily. 02/11/19  Yes [name], MD  Amino Acids-Protein Hydrolys (FEEDING SUPPLEMENT, PRO-STAT SUGAR FREE 64,) LIQD Take 30 mLs by mouth 2 (two) times daily. Patient not taking: Reported on 07/14/2020 02/17/18   Elgergawy, Silver Huguenin, MD  lisinopril (ZESTRIL) 20 MG tablet TAKE 1 TABLET (20 MG TOTAL) BY MOUTH DAILY. 07/15/20   Mayers, Cari S, PA-C  oxyCODONE-acetaminophen (PERCOCET) 5-325 MG tablet Take 1 tablet by mouth every 6 (six) hours as needed. Patient not taking: Reported on 07/14/2020 10/08/19   Gabriel Earing, PA-C  polyethylene glycol powder (GLYCOLAX/MIRALAX) 17 GM/SCOOP powder Take 17 g by mouth 2 (two) times daily as needed. Patient not taking:  Reported on 07/14/2020 06/10/20   Mayers, Cari S, PA-C     Vital Signs: BP (!) 171/145   Pulse 70   Temp 98.4 F (36.9 C) (Axillary)   Resp 14   Wt 139 lb 15.9 oz (63.5 kg)   LMP  (LMP Unknown)   SpO2 100%   BMI 28.27 kg/m   Physical Exam patient  somnolent, chest clear to auscultation bilaterally.  Intact right IJ venous catheter;  heart with regular rate and rhythm.  Abdomen soft, positive bowel sounds, nontender.  No significant lower extremity edema;no thrill or bruit in right upper arm AV fistula  Imaging: CT HEAD WO CONTRAST  Result Date: 07/15/2020 CLINICAL DATA:  70 year old female with uremic encephalopathy. Failed dialysis fistula, missed hemodialysis. EXAM: CT HEAD WITHOUT CONTRAST TECHNIQUE: Contiguous axial images were obtained from the base of the skull through the vertex without intravenous contrast. COMPARISON:  Brain MRI 04/17/2017.  Head CT 02/14/2018. FINDINGS: Brain: Stable cerebral volume since 2019. Post ischemic encephalomalacia in the right external capsule, right cerebellum right parietal lobe. Additional chronic small vessel disease changes in the bilateral basal ganglia and pons appear progressed since 2019. Patchy additional bilateral white matter hypodensity has also somewhat increased. No superimposed midline shift, ventriculomegaly, mass effect, evidence of mass lesion, intracranial hemorrhage or evidence of cortically based acute infarction. No new areas of cortical encephalomalacia. Vascular: Extensive Calcified atherosclerosis at the skull base. No suspicious intracranial vascular hyperdensity. Skull: No acute osseous abnormality identified. Sinuses/Orbits: Chronic sphenoid sinus disease has improved since 2019. Other Visualized paranasal sinuses and mastoids are stable and well pneumatized. Other: Stable orbit and scalp soft tissues. Postoperative changes to both globes. IMPRESSION: 1. No acute intracranial abnormality identified. 2. Advanced chronic ischemic  disease appears progressed since 2019 in the bilateral deep gray nuclei and pons. Electronically Signed   By: Genevie Ann M.D.   On: 07/15/2020 07:00   IR Fluoro Guide CV Line Right  Result Date: 07/14/2020 INDICATION: 70 year old female with end-stage renal disease. EXAM: NON-TUNNELED CENTRAL VENOUS HEMODIALYSIS CATHETER PLACEMENT WITH ULTRASOUND AND FLUOROSCOPIC GUIDANCE COMPARISON:  None. MEDICATIONS: None FLUOROSCOPY TIME:  0.1 minutes, (0 mGy) COMPLICATIONS: None immediate. PROCEDURE: Informed written consent was obtained from the patient after a discussion of the risks, benefits, and alternatives to treatment. Questions regarding the procedure were encouraged and answered. The right neck and chest were prepped with chlorhexidine in a sterile fashion, and a sterile drape was applied covering the operative field. Maximum barrier sterile technique with sterile gowns and gloves were used for the procedure. A timeout was performed prior to the initiation of the  procedure. After the overlying soft tissues were anesthetized, a small venotomy incision was created and a micropuncture kit was utilized to access the external jugular vein. Real-time ultrasound guidance was utilized for vascular access including the acquisition of a permanent ultrasound image documenting patency of the accessed vessel. The microwire was utilized to measure appropriate catheter length. A stiff glidewire was advanced to the level of the IVC. Under fluoroscopic guidance, the venotomy was serially dilated, ultimately allowing placement of a 16 cm temporary Trialysis catheter with tip ultimately terminating within the superior aspect of the right atrium. Final catheter positioning was confirmed and documented with a spot radiographic image. The catheter aspirates and flushes normally. The catheter was flushed with appropriate volume heparin dwells. The catheter exit site was secured with a 0-Prolene retention suture. A dressing was placed. The  patient tolerated the procedure well without immediate post procedural complication. IMPRESSION: Successful placement of a right external jugular approach 16 cm temporary dialysis catheter with tip terminating with in the superior aspect of the right atrium. The catheter is ready for immediate use. PLAN: This catheter may be converted to a tunneled dialysis catheter at a later date as indicated. Ruthann Cancer, MD [facility] Electronically Signed   By: Ruthann Cancer MD   On: 07/14/2020 17:49   IR US Guide Vasc Access Right  Result Date: 07/14/2020 INDICATION: 70 year old female with end-stage renal disease. EXAM: NON-TUNNELED CENTRAL VENOUS HEMODIALYSIS CATHETER PLACEMENT WITH ULTRASOUND AND FLUOROSCOPIC GUIDANCE COMPARISON:  None. MEDICATIONS: None FLUOROSCOPY TIME:  0.1 minutes, (0 mGy) COMPLICATIONS: None immediate. PROCEDURE: Informed written consent was obtained from the patient after a discussion of the risks, benefits, and alternatives to treatment. Questions regarding the procedure were encouraged and answered. The right neck and chest were prepped with chlorhexidine in a sterile fashion, and a sterile drape was applied covering the operative field. Maximum barrier sterile technique with sterile gowns and gloves were used for the procedure. A timeout was performed prior to the initiation of the procedure. After the overlying soft tissues were anesthetized, a small venotomy incision was created and a micropuncture kit was utilized to access the external jugular vein. Real-time ultrasound guidance was utilized for vascular access including the acquisition of a permanent ultrasound image documenting patency of the accessed vessel. The microwire was utilized to measure appropriate catheter length. A stiff glidewire was advanced to the level of the IVC. Under fluoroscopic guidance, the venotomy was serially dilated, ultimately allowing placement of a  16 cm temporary Trialysis catheter with tip ultimately terminating within the superior aspect of the right atrium. Final catheter positioning was confirmed and documented with a spot radiographic image. The catheter aspirates and flushes normally. The catheter was flushed with appropriate volume heparin dwells. The catheter exit site was secured with a 0-Prolene retention suture. A dressing was placed. The patient tolerated the procedure well without immediate post procedural complication. IMPRESSION: Successful placement of a right external jugular approach 16 cm temporary dialysis catheter with tip terminating with in the superior aspect of the right atrium. The catheter is ready for immediate use. PLAN: This catheter may be converted to a tunneled dialysis catheter at a later date as indicated. Ruthann Cancer, MD [facility] Electronically Signed   By: Ruthann Cancer MD   On: 07/14/2020 17:49    Labs:  CBC: Recent Labs    10/08/19 0722 06/11/20 1013 07/14/20 1210 07/15/20 0332  WBC  --  5.6 7.0 5.3  HGB 12.6 9.7* 11.7* 10.0*  HCT 37.0 29.6* 39.0 31.9*  PLT  --  190 204 180    COAGS: No results for input(s): INR, APTT in the last 8760 hours.  BMP: Recent Labs    06/11/20 1013 07/14/20 1210 07/14/20 1932 07/14/20 2122 07/15/20 0427 07/15/20 1120  NA 139 143  --  142 137  --   K 4.0 6.5* 6.3* 6.5* 5.0 5.9*  CL 96 102  --  100 97*  --   CO2 27 20*  --  24 27  --   GLUCOSE 50* 37*  --  114* 146*  --   BUN 30* 132*  --  129* 37*  --   CALCIUM 9.9 9.8  --  9.4 9.0  --   CREATININE 5.53* 17.31*  --  17.12* 7.86*  --   GFRNONAA 7* 2*  --  2* 5*  --   GFRAA 8*  --   --   --   --   --     LIVER FUNCTION TESTS: Recent Labs    06/11/20 1013 07/14/20 2122 07/15/20 0427  BILITOT 0.3  --   --   AST 17  --   --   ALT 15  --   --   ALKPHOS 177*  --   --   PROT 6.7  --   --   ALBUMIN 3.6* 2.7* 2.7*    Assessment and  Plan: Patient familiar to IR service for multiple vascular access procedures, latest a temp dialysis catheter placement 07/14/2020.  She presents now with clotted right upper extremity AV fistula.  On 04/21/2020 she underwent angioplasty of high-grade focal stenosis of the peripheral cephalic vein outflow .  The arterial anastomosis was patent at the time, though small caliber.  She was originally scheduled for fistula declotting as an outpatient here tomorrow, but she was admitted on 1/12 with increasing lethargy, nausea following some missed dialysis appointments.  Request now received from nephrology to proceed with fistula declot . Details/risks of procedure, including but not limited to, internal bleeding, infection, injury to adjacent structures, inability to lyse clot, need for surgery discussed with patient's son via interpreter with his understanding and consent.  Procedure scheduled for 1/14.  Electronically Signed: D. Rowe Robert, PA-C 07/15/2020, 2:21 PM   I spent a total of 30 minutes at the the patient's bedside AND on the patient's hospital floor or unit, greater than 50% of which was counseling/coordinating care for right upper extremity dialysis fistula thrombolysis with possible angioplasty and stenting   Patient ID: Sarah Kelley, female   DOB: 1951/05/31, 70 y.o.   MRN: [id number]

## 2020-07-15 NOTE — Consult Note (Addendum)
[facility] Nurse Consult Note: Reason for Consult: Consult requested for left heel wound. 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  Scant amt yellow drainage, no odor or fluctuance. Right heel with pink dry scar tissue from previous wound which has healed.  Dressing procedure/placement/frequency: Float heels to reduce pressure.Topical treatment orders provided for bedside nurses to perform as follows to assist with enzymatic debridement: Apply Santyl to left heel wound Q day, then cover with moist gauze and kerlex. Please re-consult if further assistance is needed.  Thank-you,  Julien Girt MSN, [place], [place], [place], [place]

## 2020-07-15 NOTE — Progress Notes (Signed)
PROGRESS NOTE    Sarah Kelley  JOA:[id number] DOB: October 27, 1950 DOA: 07/14/2020 PCP: [name], MD   Chief Complaint  Patient presents with  . Vascular Access Problem   Brief Narrative: 70yoF w/ ESRD on HD tts, chronic seizure like activity, HTN, IDDM, NEUROPATHY, rt eye blind, presented with increasing lethargic, feeling nausea glucose after missing several dialysis x2 on 07/14/20. As per report- right arm AV fistula clotted-missed 2 sessions of HD and 07/13/20. She last received insulin  1/11 am and in the evening family checked her blood sugar, which was less than 100 and did not give the evening dose of insulin.  Family also noticed increasing twitching like movement of her shoulders and arms which happened in the past when she missed her dialysis. ED Course: Significant uremia with BUN 132, potassium 6.5, creatinine 17.3 glucose 37.  Patient was given hyperkalemia cocktail in the ED, IR and nephrology informed and subclavian access was placed by IR emergently and patient had HD and was admitted  Subjective:  Alert,awake, confused, abel to tell me her name, mitten on place on [place] o2 Overnight was confused- had ct head ( advanced chronic ischemic changes no acute findings), cbg, ammonia, labs done. Seen again with son and spanish interpretor at bedside Son she is confused more for him, at baseline has memory issues and does not know her DOB at baseline    Assessment & Plan:  Acute encephalopathy multifactorial-uremic 2/2 missed HD X2 and hypoglycemia. Ct head No acute findings. S/p emergent HD 1/12. Cont mitten, supportive care, fall precaution and cont HD for uremia and monitor CBG  ESRD w. Clotted AVF, on HD TTS/metabolci acidosis/Metabolci bone disease hyperphosphetemia: Nephro on board. IR to declot wen stable-defer to nephro Recent Labs  Lab 07/14/20 1210 07/14/20 2122 07/15/20 0427  BUN 132* 129* 37*  CREATININE 17.31* 17.12* 7.86*   Asterixis from uremia-  better  IDDM-w/ hypoglycemia form insulin in the setting of missed HD- last hba1c stable 8.0 on 06/10/21. sugar stable, monitor cbg, hold off insulin Recent Labs  Lab 07/14/20 1728 07/14/20 1830 07/14/20 1857 07/15/20 0424 07/15/20 0805  GLUCAP 102* 65* 99 136* 155*   Hypertension- BP stable now.  Fluid overload from missed HD- HD as above  Hyperkalemia 2/2 missed HD,resolved with HD Recent Labs  Lab 07/14/20 1210 07/14/20 1932 07/14/20 2122 07/15/20 0427  K 6.5* 6.3* 6.5* 5.0   GOC: Remains full code.  She does have baseline dementia as per the son.  Discussed w/ nephrology- wondering  whether it is helping this patient with ongoing dialysis-will consult palliative care to discuss further goals of care.  Nutrition: Diet Order            Diet renal/carb modified with fluid restriction Diet-HS Snack? Nothing; Fluid restriction: 1200 mL Fluid; Room service appropriate? Yes; Fluid consistency: Thin  Diet effective now                  Body mass index is 28.27 kg/m.  Pressure Ulcer: Pressure Injury 07/15/20 Heel Left Unstageable - Full thickness tissue loss in which the base of the injury is covered by slough (yellow, tan, gray, green or brown) and/or eschar (tan, brown or black) in the wound bed. (Active)  07/15/20   Location: Heel  Location Orientation: Left  Staging: Unstageable - Full thickness tissue loss in which the base of the injury is covered by slough (yellow, tan, gray, green or brown) and/or eschar (tan, brown or black) in the wound bed.  Wound Description (Comments):   Present on Admission: Yes    DVT prophylaxis: heparin injection 5,000 Units Start: 07/14/20 2200 Code Status:   Code Status: Full Code  Family Communication: plan of care discussed with patient'S SON at bedside.  Status is: Inpatient  Remains inpatient appropriate because:IV treatments appropriate due to intensity of illness or inability to take PO and Inpatient level of care appropriate  due to severity of illness   Dispo: The patient is from: Home              Anticipated d/c is to: Home PTOT eval today              Anticipated d/c date is: 2 days              Patient currently is not medically stable to d/c.  Consultants:see note  Procedures:see note  Culture/Microbiology    Component Value Date/Time   SDES BLOOD RIGHT HAND 02/13/2018 0404   SPECREQUEST  02/13/2018 0404    BOTTLES DRAWN AEROBIC AND ANAEROBIC Blood Culture adequate volume   CULT  02/13/2018 0404    NO GROWTH 5 DAYS Performed at [facility], [place] [address] [place], [place] [address]    REPTSTATUS 02/18/2018 FINAL 02/13/2018 0404    Other culture-see note  Medications: Scheduled Meds: . atorvastatin  40 mg Oral Daily  . Chlorhexidine Gluconate Cloth  6 each Topical Q0600  . cinacalcet  60 mg Oral Q breakfast  . collagenase   Topical Daily  . DULoxetine  60 mg Oral Daily  . feeding supplement (NEPRO CARB STEADY)  237 mL Oral Q T,Th,Sa-HD  . heparin  5,000 Units Subcutaneous Q12H  . insulin aspart  0-6 Units Subcutaneous TID WC  . loratadine  10 mg Oral Daily  . mupirocin ointment  1 application Topical BID  . pantoprazole  40 mg Oral Daily  . sucroferric oxyhydroxide  500 mg Oral BID WC   Continuous Infusions:  Antimicrobials: Anti-infectives (From admission, onward)   None     Objective: Vitals: Today's Vitals   07/15/20 0427 07/15/20 0600 07/15/20 0623 07/15/20 0808  BP: (!) 176/83   (!) 119/91  Pulse: 74   69  Resp: 16   19  Temp: 98.4 F (36.9 C)   (!) 97.4 F (36.3 C)  TempSrc: Oral   Axillary  SpO2: 96% 95% 100% 99%  Weight: 63.5 kg     PainSc:        Intake/Output Summary (Last 24 hours) at 07/15/2020 0945 Last data filed at 07/15/2020 0035 Gross per 24 hour  Intake -  Output 560 ml  Net -560 ml   Filed Weights   07/14/20 2110 07/15/20 0230 07/15/20 0427  Weight: 67 kg 63.5 kg 63.5 kg   Weight change:   Intake/Output from previous day: 01/12 0701  - 01/13 0700 In: -  Out: 560  Intake/Output this shift: No intake/output data recorded.  Examination: General exam: able to tell me her name, son at bedside, not aware of location, on 2l [place].  HEENT:Oral mucosa moist, Ear/Nose WNL grossly,dentition normal. Respiratory system: bilaterally clear,no wheezing or crackles,no use of accessory muscle, non tender. Cardiovascular system: S1 & S2 +, regular, No JVD. Gastrointestinal system: Abdomen soft, NT,ND, BS+. Nervous System:Alert, awake, moving UE well, and grossly nonfocal Extremities: No edema, distal peripheral pulses palpable.  Skin: No rashes,no icterus. MSK: Normal muscle bulk,tone, power LEFT S/C IJ IN PLACE  Data Reviewed: I have personally reviewed following labs  and imaging studies CBC: Recent Labs  Lab 07/14/20 1210 07/15/20 0332  WBC 7.0 5.3  HGB 11.7* 10.0*  HCT 39.0 31.9*  MCV 104.6* 100.6*  PLT 204 485   Basic Metabolic Panel: Recent Labs  Lab 07/14/20 1210 07/14/20 1932 07/14/20 2122 07/15/20 0427  NA 143  --  142 137  K 6.5* 6.3* 6.5* 5.0  CL 102  --  100 97*  CO2 20*  --  24 27  GLUCOSE 37*  --  114* 146*  BUN 132*  --  129* 37*  CREATININE 17.31*  --  17.12* 7.86*  CALCIUM 9.8  --  9.4 9.0  PHOS  --   --  6.8* 4.0   GFR: Estimated Creatinine Clearance: 5.3 mL/min (A) (by C-G formula based on SCr of 7.86 mg/dL (H)). Liver Function Tests: Recent Labs  Lab 07/14/20 2122 07/15/20 0427  ALBUMIN 2.7* 2.7*   No results for input(s): LIPASE, AMYLASE in the last 168 hours. No results for input(s): AMMONIA in the last 168 hours. Coagulation Profile: No results for input(s): INR, PROTIME in the last 168 hours. Cardiac Enzymes: No results for input(s): CKTOTAL, CKMB, CKMBINDEX, TROPONINI in the last 168 hours. BNP (last 3 results) No results for input(s): PROBNP in the last 8760 hours. HbA1C: No results for input(s): HGBA1C in the last 72 hours. CBG: Recent Labs  Lab 07/14/20 1728 07/14/20 1830  07/14/20 1857 07/15/20 0424 07/15/20 0805  GLUCAP 102* 65* 99 136* 155*   Lipid Profile: No results for input(s): CHOL, HDL, LDLCALC, TRIG, CHOLHDL, LDLDIRECT in the last 72 hours. Thyroid Function Tests: No results for input(s): TSH, T4TOTAL, FREET4, T3FREE, THYROIDAB in the last 72 hours. Anemia Panel: No results for input(s): VITAMINB12, FOLATE, FERRITIN, TIBC, IRON, RETICCTPCT in the last 72 hours. Sepsis Labs: No results for input(s): PROCALCITON, LATICACIDVEN in the last 168 hours.  Recent Results (from the past 240 hour(s))  Resp panel by RT-PCR (RSV, Flu A&B, Covid) Nasopharyngeal Swab     Status: None   Collection Time: 07/14/20  2:06 PM   Specimen: Nasopharyngeal Swab; Nasopharyngeal(NP) swabs in vial transport medium  Result Value Ref Range Status   SARS Coronavirus 2 by RT PCR NEGATIVE NEGATIVE Final    Comment: (NOTE) SARS-CoV-2 target nucleic acids are NOT DETECTED.  The SARS-CoV-2 RNA is generally detectable in upper respiratory specimens during the acute phase of infection. The lowest concentration of SARS-CoV-2 viral copies this assay can detect is 138 copies/mL. A negative result does not preclude SARS-Cov-2 infection and should not be used as the sole basis for treatment or other patient management decisions. A negative result may occur with  improper specimen collection/handling, submission of specimen other than nasopharyngeal swab, presence of viral mutation(s) within the areas targeted by this assay, and inadequate number of viral copies(<138 copies/mL). A negative result must be combined with clinical observations, patient history, and epidemiological information. The expected result is Negative.  Fact Sheet for Patients:  EntrepreneurPulse.com.au  Fact Sheet for Healthcare Providers:  IncredibleEmployment.be  This test is no t yet approved or cleared by the [place] FDA and  has been authorized for detection  and/or diagnosis of SARS-CoV-2 by FDA under an Emergency Use Authorization (EUA). This EUA will remain  in effect (meaning this test can be used) for the duration of the COVID-19 declaration under Section 564(b)(1) of the Act, 21 U.S.C.section 360bbb-3(b)(1), unless the authorization is terminated  or revoked sooner.       Influenza A by PCR  NEGATIVE NEGATIVE Final   Influenza B by PCR NEGATIVE NEGATIVE Final    Comment: (NOTE) The Xpert Xpress SARS-CoV-2/FLU/RSV plus assay is intended as an aid in the diagnosis of influenza from Nasopharyngeal swab specimens and should not be used as a sole basis for treatment. Nasal washings and aspirates are unacceptable for Xpert Xpress SARS-CoV-2/FLU/RSV testing.  Fact Sheet for Patients: EntrepreneurPulse.com.au  Fact Sheet for Healthcare Providers: IncredibleEmployment.be  This test is not yet approved or cleared by the [place] FDA and has been authorized for detection and/or diagnosis of SARS-CoV-2 by FDA under an Emergency Use Authorization (EUA). This EUA will remain in effect (meaning this test can be used) for the duration of the COVID-19 declaration under Section 564(b)(1) of the Act, 21 U.S.C. section 360bbb-3(b)(1), unless the authorization is terminated or revoked.     Resp Syncytial Virus by PCR NEGATIVE NEGATIVE Final    Comment: (NOTE) Fact Sheet for Patients: EntrepreneurPulse.com.au  Fact Sheet for Healthcare Providers: IncredibleEmployment.be  This test is not yet approved or cleared by the [place] FDA and has been authorized for detection and/or diagnosis of SARS-CoV-2 by FDA under an Emergency Use Authorization (EUA). This EUA will remain in effect (meaning this test can be used) for the duration of the COVID-19 declaration under Section 564(b)(1) of the Act, 21 U.S.C. section 360bbb-3(b)(1), unless the authorization is terminated  or revoked.  Performed at [facility], [place] [address] [place], [place] [address]      Radiology Studies: CT HEAD WO CONTRAST  Result Date: 07/15/2020 CLINICAL DATA:  70 year old female with uremic encephalopathy. Failed dialysis fistula, missed hemodialysis. EXAM: CT HEAD WITHOUT CONTRAST TECHNIQUE: Contiguous axial images were obtained from the base of the skull through the vertex without intravenous contrast. COMPARISON:  Brain MRI 04/17/2017.  Head CT 02/14/2018. FINDINGS: Brain: Stable cerebral volume since 2019. Post ischemic encephalomalacia in the right external capsule, right cerebellum right parietal lobe. Additional chronic small vessel disease changes in the bilateral basal ganglia and pons appear progressed since 2019. Patchy additional bilateral white matter hypodensity has also somewhat increased. No superimposed midline shift, ventriculomegaly, mass effect, evidence of mass lesion, intracranial hemorrhage or evidence of cortically based acute infarction. No new areas of cortical encephalomalacia. Vascular: Extensive Calcified atherosclerosis at the skull base. No suspicious intracranial vascular hyperdensity. Skull: No acute osseous abnormality identified. Sinuses/Orbits: Chronic sphenoid sinus disease has improved since 2019. Other Visualized paranasal sinuses and mastoids are stable and well pneumatized. Other: Stable orbit and scalp soft tissues. Postoperative changes to both globes. IMPRESSION: 1. No acute intracranial abnormality identified. 2. Advanced chronic ischemic disease appears progressed since 2019 in the bilateral deep gray nuclei and pons. Electronically Signed   By: Genevie Ann M.D.   On: 07/15/2020 07:00   IR Fluoro Guide CV Line Right  Result Date: 07/14/2020 INDICATION: 70 year old female with end-stage renal disease. EXAM: NON-TUNNELED CENTRAL VENOUS HEMODIALYSIS CATHETER PLACEMENT WITH ULTRASOUND AND FLUOROSCOPIC GUIDANCE COMPARISON:  None. MEDICATIONS: None  FLUOROSCOPY TIME:  0.1 minutes, (0 mGy) COMPLICATIONS: None immediate. PROCEDURE: Informed written consent was obtained from the patient after a discussion of the risks, benefits, and alternatives to treatment. Questions regarding the procedure were encouraged and answered. The right neck and chest were prepped with chlorhexidine in a sterile fashion, and a sterile drape was applied covering the operative field. Maximum barrier sterile technique with sterile gowns and gloves were used for the procedure. A timeout was performed prior to the initiation of the procedure. After the overlying  soft tissues were anesthetized, a small venotomy incision was created and a micropuncture kit was utilized to access the external jugular vein. Real-time ultrasound guidance was utilized for vascular access including the acquisition of a permanent ultrasound image documenting patency of the accessed vessel. The microwire was utilized to measure appropriate catheter length. A stiff glidewire was advanced to the level of the IVC. Under fluoroscopic guidance, the venotomy was serially dilated, ultimately allowing placement of a 16 cm temporary Trialysis catheter with tip ultimately terminating within the superior aspect of the right atrium. Final catheter positioning was confirmed and documented with a spot radiographic image. The catheter aspirates and flushes normally. The catheter was flushed with appropriate volume heparin dwells. The catheter exit site was secured with a 0-Prolene retention suture. A dressing was placed. The patient tolerated the procedure well without immediate post procedural complication. IMPRESSION: Successful placement of a right external jugular approach 16 cm temporary dialysis catheter with tip terminating with in the superior aspect of the right atrium. The catheter is ready for immediate use. PLAN: This catheter may be converted to a tunneled dialysis catheter at a later date as indicated. Ruthann Cancer,  MD [facility] Radiology Electronically Signed   By: Ruthann Cancer MD   On: 07/14/2020 17:49   IR US Guide Vasc Access Right  Result Date: 07/14/2020 INDICATION: 70 year old female with end-stage renal disease. EXAM: NON-TUNNELED CENTRAL VENOUS HEMODIALYSIS CATHETER PLACEMENT WITH ULTRASOUND AND FLUOROSCOPIC GUIDANCE COMPARISON:  None. MEDICATIONS: None FLUOROSCOPY TIME:  0.1 minutes, (0 mGy) COMPLICATIONS: None immediate. PROCEDURE: Informed written consent was obtained from the patient after a discussion of the risks, benefits, and alternatives to treatment. Questions regarding the procedure were encouraged and answered. The right neck and chest were prepped with chlorhexidine in a sterile fashion, and a sterile drape was applied covering the operative field. Maximum barrier sterile technique with sterile gowns and gloves were used for the procedure. A timeout was performed prior to the initiation of the procedure. After the overlying soft tissues were anesthetized, a small venotomy incision was created and a micropuncture kit was utilized to access the external jugular vein. Real-time ultrasound guidance was utilized for vascular access including the acquisition of a permanent ultrasound image documenting patency of the accessed vessel. The microwire was utilized to measure appropriate catheter length. A stiff glidewire was advanced to the level of the IVC. Under fluoroscopic guidance, the venotomy was serially dilated, ultimately allowing placement of a 16 cm temporary Trialysis catheter with tip ultimately terminating within the superior aspect of the right atrium. Final catheter positioning was confirmed and documented with a spot radiographic image. The catheter aspirates and flushes normally. The catheter was flushed with appropriate volume heparin dwells. The catheter exit site was secured with a 0-Prolene retention suture. A dressing was placed. The  patient tolerated the procedure well without immediate post procedural complication. IMPRESSION: Successful placement of a right external jugular approach 16 cm temporary dialysis catheter with tip terminating with in the superior aspect of the right atrium. The catheter is ready for immediate use. PLAN: This catheter may be converted to a tunneled dialysis catheter at a later date as indicated. Ruthann Cancer, MD [facility] Radiology Electronically Signed   By: Ruthann Cancer MD   On: 07/14/2020 17:49     LOS: 1 day   Antonieta Pert, MD [facility]  07/15/2020, 9:45 AM

## 2020-07-15 NOTE — ED Notes (Signed)
Report given to floor RN

## 2020-07-15 NOTE — Evaluation (Signed)
Clinical/Bedside Swallow Evaluation Patient Details  Name: Sarah Kelley MRN: [id number] Date of Birth: [birth year]  Today's Date: 07/15/2020 Time: SLP Start Time (ACUTE ONLY): 1407 SLP Stop Time (ACUTE ONLY): 1422 SLP Time Calculation (min) (ACUTE ONLY): 15 min  Past Medical History:  Past Medical History:  Diagnosis Date  . Diabetes mellitus   . ESRD (end stage renal disease) ([place])    dialysis  . Hyperlipidemia   . Hypertension   . Iron deficiency anemia   . MRSA infection    hx of  . Neuropathy    Diabetic  . Pneumonia 07/2016  . Renal disorder   . Seizure-like activity ([place])   . Sepsis ([place])   . Thyroid disease   . Wears dentures    Past Surgical History:  Past Surgical History:  Procedure Laterality Date  . AMPUTATION Left 05/21/2015   Procedure: Left Foot 3rd Ray Amputation;  Surgeon: Newt Minion, MD;  Location: [place];  Service: Orthopedics;  Laterality: Left;  . AV FISTULA PLACEMENT Left 03/28/2016   Procedure: ARTERIOVENOUS (AV) FISTULA CREATION LEFT ARM;  Surgeon: Waynetta Sandy, MD;  Location: [place];  Service: Vascular;  Laterality: Left;  . AV FISTULA PLACEMENT Right 06/04/2019   Procedure: ARTERIOVENOUS (AV) FISTULA CREATION RIGHT ARM;  Surgeon: Waynetta Sandy, MD;  Location: [place];  Service: Vascular;  Laterality: Right;  . DILATION AND CURETTAGE OF UTERUS    . EYE SURGERY    . FISTULA SUPERFICIALIZATION Left 09/04/2016   Procedure: FISTULA SUPERFICIALIZATION LEFT UPPER ARM;  Surgeon: Waynetta Sandy, MD;  Location: [place];  Service: Vascular;  Laterality: Left;  . FISTULA SUPERFICIALIZATION Right 10/08/2019   Procedure: FISTULA SUPERFICIALIZATION OR RIGHT ARM;  Surgeon: Waynetta Sandy, MD;  Location: [place];  Service: Vascular;  Laterality: Right;  . IR AV DIALY SHUNT INTRO NEEDLE/INTRAC INITIAL W/PTA/STENT/IMG LT Left 06/13/2017  . IR AV DIALY SHUNT INTRO NEEDLE/INTRAC INITIAL W/PTA/STENT/IMG LT Left 09/12/2017  .  IR AV DIALY SHUNT INTRO NEEDLE/INTRACATH INITIAL W/PTA/IMG LEFT  11/10/2016  . IR AV DIALY SHUNT INTRO NEEDLE/INTRACATH INITIAL W/PTA/IMG LEFT  03/09/2017  . IR AV DIALY SHUNT INTRO NEEDLE/INTRACATH INITIAL W/PTA/IMG LEFT  12/12/2017  . IR AV DIALY SHUNT INTRO NEEDLE/INTRACATH INITIAL W/PTA/IMG LEFT  02/27/2018  . IR AV DIALY SHUNT INTRO NEEDLE/INTRACATH INITIAL W/PTA/IMG LEFT  06/05/2018  . IR AV DIALY SHUNT INTRO NEEDLE/INTRACATH INITIAL W/PTA/IMG LEFT  09/11/2018  . IR AV DIALY SHUNT INTRO NEEDLE/INTRACATH INITIAL W/PTA/IMG RIGHT Right 04/21/2020  . IR DIALY SHUNT INTRO NEEDLE/INTRACATH INITIAL W/IMG LEFT Left 05/08/2018  . IR FLUORO GUIDE CV LINE RIGHT  02/01/2019  . IR FLUORO GUIDE CV LINE RIGHT  07/14/2020  . IR GENERIC HISTORICAL  08/01/2016   IR FLUORO GUIDE CV LINE RIGHT 08/01/2016 [name], MD [facility]  . IR GENERIC HISTORICAL  08/01/2016   IR US GUIDE VASC ACCESS RIGHT 08/01/2016 [name], MD [facility]  . IR REMOVAL TUN CV CATH W/O FL  11/01/2016  . IR REMOVAL TUN CV CATH W/O FL  01/02/2020  . IR THROMBECTOMY AV FISTULA W/THROMBOLYSIS INC/SHUNT/IMG LEFT Left 01/31/2019  . IR THROMBECTOMY AV FISTULA W/THROMBOLYSIS/PTA INC/SHUNT/IMG LEFT Left 01/27/2019  . IR US GUIDE VASC ACCESS LEFT  11/10/2016  . IR US GUIDE VASC ACCESS LEFT  09/12/2017  . IR US GUIDE VASC ACCESS LEFT  12/12/2017  . IR US GUIDE VASC ACCESS LEFT  06/05/2018  . IR US GUIDE VASC ACCESS LEFT  09/11/2018  . IR US GUIDE  VASC ACCESS LEFT  01/27/2019  . IR US GUIDE VASC ACCESS LEFT  02/01/2019  . IR US GUIDE VASC ACCESS RIGHT  02/01/2019  . IR US GUIDE VASC ACCESS RIGHT  07/14/2020   HPI:  70yo Spanish-speaking female w/ ESRD on HD tts, chronic seizure like activity, HTN, IDDM, NEUROPATHY, rt eye blind, presented with increasing lethargy, nausea after missing several dialysis x2 on 07/14/20. CT head advanced chronic ischemic changes, no acute findings.  Baseline memory deficits, does not know DOB at baseline per chart.  Dx acute  encephalopathy, ESRD with clotted AVF, metabolic acidosis.   Assessment / Plan / Recommendation Clinical Impression  Interpreter and son present for swallowing assessment.  Pt with eyes closed, but answered all questions and was interactive.  She accepted multiple sips of water from a straw and allowed very small spoonsful of applesauce with active manipulation, sufficient attention, and the appearance of a brisk swallow response with no s/s of dysphagia nor aspiration. Recommend starting a dysphagia 1 diet, thin liquids.  As mental status improves to baseline, diet may be advanced per nursing. No further SLP f/u is needed; no reconsult needed if MS continues to improve. D/W RN, Marya Amsler. SLP Visit Diagnosis: Dysphagia, unspecified (R13.10)    Aspiration Risk  No limitations    Diet Recommendation   dysphagia 1/thin liquids - RN to advance as mental status improves.   Medication Administration: Whole meds with liquid    Other  Recommendations Oral Care Recommendations: Oral care BID   Follow up Recommendations None                  Prognosis Prognosis for Safe Diet Advancement: Good      Swallow Study   General Date of Onset: 07/14/20 HPI: 70yo Spanish-speaking female w/ ESRD on HD tts, chronic seizure like activity, HTN, IDDM, NEUROPATHY, rt eye blind, presented with increasing lethargy, nausea after missing several dialysis x2 on 07/14/20. CT head advanced chronic ischemic changes, no acute findings.  Baseline memory deficits, does not know DOB at baseline per chart.  Dx acute encephalopathy, ESRD with clotted AVF, metabolic acidosis. Type of Study: Bedside Swallow Evaluation Previous Swallow Assessment: June 2018 due to MS changes - swallow was functional Diet Prior to this Study: NPO Temperature Spikes Noted: No Respiratory Status: Room air History of Recent Intubation: No Behavior/Cognition: Alert Oral Cavity Assessment: Within Functional Limits Oral Care Completed by SLP:  No Self-Feeding Abilities: Total assist Patient Positioning: Upright in bed Baseline Vocal Quality: Normal Volitional Cough: Strong Volitional Swallow: Able to elicit    Oral/Motor/Sensory Function Overall Oral Motor/Sensory Function: Within functional limits   Ice Chips Ice chips: Not tested   Thin Liquid Thin Liquid: Within functional limits    Nectar Thick Nectar Thick Liquid: Not tested   Honey Thick Honey Thick Liquid: Not tested   Puree Puree: Within functional limits   Solid     Solid: Not tested      Juan Quam Laurice 07/15/2020,2:27 PM  Estill Bamberg L. Tivis Ringer, [place] Office number [phone] Pager [phone]

## 2020-07-15 NOTE — Evaluation (Signed)
Physical Therapy Evaluation Patient Details Name: Sarah Kelley MRN: [id number] DOB: June 02, 1951 Today's Date: 07/15/2020   History of Present Illness  Pt adm with acute encephalopathy due to uremia, missed HD, and hypoglycemia. AV fistula clotted. PMH - ESRD on HD, chronic seizure like activity, HTN, DM, neuropathy, rt eye blind.  Clinical Impression  Pt presents to PT lethargic and with limited participation. Son present and assisted pt to EOB.  Pt's son cares for pt at home and feels comfortable continuing to do so. He states they have needed equipment. Will follow acutely to maximize mobility but feel the son can provide any needed assist at home.     Follow Up Recommendations No PT follow up;Supervision/Assistance - 24 hour    Equipment Recommendations  None recommended by PT    Recommendations for Other Services       Precautions / Restrictions Precautions Precautions: Fall      Mobility  Bed Mobility Overal bed mobility: Needs Assistance Bed Mobility: Supine to Sit;Sit to Supine     Supine to sit: Total assist;HOB elevated Sit to supine: Total assist;HOB elevated   General bed mobility comments: Assist for all aspects. Son assisted    Transfers                 General transfer comment: Did not attempt due to pt lethargic  Ambulation/Gait                Stairs            Wheelchair Mobility    Modified Rankin (Stroke Patients Only)       Balance Overall balance assessment: Needs assistance Sitting-balance support: Bilateral upper extremity supported;Feet unsupported Sitting balance-Leahy Scale: Poor Sitting balance - Comments: min assist with very brief periods of min guard to maintain sitting EOB Postural control: Posterior lean                                   Pertinent Vitals/Pain Pain Assessment: Faces Faces Pain Scale: No hurt    Home Living Family/patient expects to be discharged to:: Private  residence Living Arrangements: Children (son) Available Help at Discharge: Family;Available 24 hours/day Type of Home: Mobile home Home Access: Ramped entrance     Home Layout: One level Home Equipment: [place] - 2 wheels;Cane - single point;Bedside commode;Wheelchair - manual      Prior Function Level of Independence: Needs assistance   Gait / Transfers Assistance Needed: Assist for bed mobility, transfers and very short distance ambulation.           Hand Dominance   Dominant Hand: Right    Extremity/Trunk Assessment   Upper Extremity Assessment Upper Extremity Assessment: Defer to OT evaluation    Lower Extremity Assessment Lower Extremity Assessment: RLE deficits/detail;LLE deficits/detail RLE Deficits / Details: strength <3/5 LLE Deficits / Details: strength <3/5       Communication   Communication: Prefers language other than [place] (son translated)  Cognition Arousal/Alertness: Lethargic Behavior During Therapy: Flat affect Overall Cognitive Status: Difficult to assess                                 General Comments: Pt with baseline dementia. Responded to and conversed with son. Kept eyes closed throughout      General Comments      Exercises     Assessment/Plan  PT Assessment Patient needs continued PT services  PT Problem List Decreased strength;Decreased activity tolerance;Decreased balance;Decreased mobility;Decreased cognition       PT Treatment Interventions DME instruction;Gait training;Functional mobility training;Therapeutic activities;Therapeutic exercise;Balance training;Patient/family education    PT Goals (Current goals can be found in the Care Plan section)  Acute Rehab PT Goals Patient Stated Goal: Did not state PT Goal Formulation: With family Time For Goal Achievement: 07/29/20 Potential to Achieve Goals: Fair    Frequency Min 2X/week   Barriers to discharge        Co-evaluation                AM-PAC PT "6 Clicks" Mobility  Outcome Measure Help needed turning from your back to your side while in a flat bed without using bedrails?: Total Help needed moving from lying on your back to sitting on the side of a flat bed without using bedrails?: Total Help needed moving to and from a bed to a chair (including a wheelchair)?: Total Help needed standing up from a chair using your arms (e.g., wheelchair or bedside chair)?: Total Help needed to walk in hospital room?: Total Help needed climbing 3-5 steps with a railing? : Total 6 Click Score: 6    End of Session   Activity Tolerance: Patient limited by lethargy Patient left: in bed;with call bell/phone within reach;with bed alarm set;with family/visitor present Nurse Communication: Mobility status PT Visit Diagnosis: Other abnormalities of gait and mobility (R26.89);Muscle weakness (generalized) (M62.81)    Time: 1600-1610 PT Time Calculation (min) (ACUTE ONLY): 10 min   Charges:   PT Evaluation $PT Eval Moderate Complexity: [place] Pager [phone] Office [place] 07/15/2020, 4:38 PM

## 2020-07-15 NOTE — Progress Notes (Signed)
Overnight event  Patient admitted yesterday evening for hyperkalemia and uremic encephalopathy secondary to missed hemodialysis due to failed right arm AV fistula.  Subclavian access was placed by IR emergently and patient underwent dialysis.  She was also hypoglycemic at the time of admission in the setting of long-acting insulin use.  Notified by floor RN that patient arrived to the floor from the ED with altered mental status.  Vital signs -temperature 98.4 F, blood pressure 150/56, heart rate 83, respiratory rate 17, SPO2 98% on room air.  Patient was seen and examined at bedside.  Initially Spanish video interpreter services utilized but patient did not respond to any of the questions that the interpreter asked.  Soon after one of the floor RNs who speaks Spanish came to the room.  Patient somnolent but arousable.  Oriented to self only and thinks she is at home.  Not following any commands but noted to be moving all of her extremities spontaneously.  No focal weakness.  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  Plantar reflex normal bilaterally.  Not tachycardic, heart rate in the 70s.  Not tachypneic or hypoxic.  Satting in the mid 90s on room air.  Equal air entry upon auscultation of lungs bilaterally, no wheezing.  Blood pressure 169/76.  Abdomen soft and nontender to palpation.  No lower extremity edema.  -Oxycodone listed in [facility] but she has not received any doses.  She received Ativan 0.5 mg yesterday in the ED at 1750.  Avoid sedating medications. -Not hypoglycemic at present, CBG 136.  Hold long-acting insulin.  Continue frequent CBG checks. -Stat labs ordered including ammonia, renal function panel, arterial blood gas -Stat head CT ordered -Continue to monitor very closely

## 2020-07-16 ENCOUNTER — Inpatient Hospital Stay (HOSPITAL_COMMUNITY): Admission: RE | Admit: 2020-07-16 | Payer: Self-pay | Source: Ambulatory Visit

## 2020-07-16 ENCOUNTER — Inpatient Hospital Stay (HOSPITAL_COMMUNITY): Payer: Medicaid Other

## 2020-07-16 HISTORY — PX: IR THROMBECTOMY AV FISTULA W/THROMBOLYSIS INC/SHUNT/IMG RIGHT: IMG6118

## 2020-07-16 HISTORY — PX: IR US GUIDE VASC ACCESS RIGHT: IMG2390

## 2020-07-16 HISTORY — PX: IR THROMBECTOMY AV FISTULA W/THROMBOLYSIS/PTA INC/SHUNT/IMG LEFT: IMG6106

## 2020-07-16 LAB — CBC
HCT: 35.3 % — ABNORMAL LOW (ref 36.0–46.0)
Hemoglobin: 10.8 g/dL — ABNORMAL LOW (ref 12.0–15.0)
MCH: 31 pg (ref 26.0–34.0)
MCHC: 30.6 g/dL (ref 30.0–36.0)
MCV: 101.4 fL — ABNORMAL HIGH (ref 80.0–100.0)
Platelets: 175 10*3/uL (ref 150–400)
RBC: 3.48 MIL/uL — ABNORMAL LOW (ref 3.87–5.11)
RDW: 14.4 % (ref 11.5–15.5)
WBC: 6.3 10*3/uL (ref 4.0–10.5)
nRBC: 0 % (ref 0.0–0.2)

## 2020-07-16 LAB — GLUCOSE, CAPILLARY
Glucose-Capillary: 163 mg/dL — ABNORMAL HIGH (ref 70–99)
Glucose-Capillary: 150 mg/dL — ABNORMAL HIGH (ref 70–99)
Glucose-Capillary: 115 mg/dL — ABNORMAL HIGH (ref 70–99)
Glucose-Capillary: 155 mg/dL — ABNORMAL HIGH (ref 70–99)

## 2020-07-16 LAB — BASIC METABOLIC PANEL
Anion gap: 14 (ref 5–15)
BUN: 50 mg/dL — ABNORMAL HIGH (ref 8–23)
CO2: 25 mmol/L (ref 22–32)
Calcium: 9.4 mg/dL (ref 8.9–10.3)
Chloride: 98 mmol/L (ref 98–111)
Creatinine, Ser: 10.47 mg/dL — ABNORMAL HIGH (ref 0.44–1.00)
GFR, Estimated: 4 mL/min — ABNORMAL LOW (ref >60)
Glucose, Bld: 172 mg/dL — ABNORMAL HIGH (ref 70–99)
Potassium: 5.7 mmol/L — ABNORMAL HIGH (ref 3.5–5.1)
Sodium: 137 mmol/L (ref 135–145)

## 2020-07-16 LAB — PROTIME-INR
INR: 1.2 (ref 0.8–1.2)
Prothrombin Time: 14.6 seconds (ref 11.4–15.2)

## 2020-07-16 LAB — POTASSIUM
Potassium: 5.7 mmol/L — ABNORMAL HIGH (ref 3.5–5.1)
Potassium: 5.8 mmol/L — ABNORMAL HIGH (ref 3.5–5.1)
Potassium: 5.9 mmol/L — ABNORMAL HIGH (ref 3.5–5.1)
Potassium: 6.1 mmol/L — ABNORMAL HIGH (ref 3.5–5.1)

## 2020-07-16 MED ORDER — HEPARIN SODIUM (PORCINE) 1000 UNIT/ML IJ SOLN
INTRAMUSCULAR | Status: AC
  Filled 2020-07-16: qty 1

## 2020-07-16 MED ORDER — IOHEXOL 300 MG/ML  SOLN
150.0000 mL | Freq: Once | INTRAMUSCULAR | Status: AC | PRN
  Administered 2020-07-16: 25 mL via INTRA_ARTERIAL

## 2020-07-16 MED ORDER — ALTEPLASE 2 MG IJ SOLR
INTRAMUSCULAR | Status: AC
  Filled 2020-07-16: qty 4

## 2020-07-16 MED ORDER — LIDOCAINE HCL 1 % IJ SOLN
INTRAMUSCULAR | Status: AC | PRN
Start: 2020-07-16 — End: 2020-07-16
  Administered 2020-07-16: 15 mL

## 2020-07-16 MED ORDER — MIDAZOLAM HCL 2 MG/2ML IJ SOLN
INTRAMUSCULAR | Status: AC | PRN
  Administered 2020-07-16: 1 mg via INTRAVENOUS

## 2020-07-16 MED ORDER — FENTANYL CITRATE (PF) 100 MCG/2ML IJ SOLN
INTRAMUSCULAR | Status: AC | PRN
  Administered 2020-07-16: 50 ug via INTRAVENOUS

## 2020-07-16 MED ORDER — LIDOCAINE HCL 1 % IJ SOLN
INTRAMUSCULAR | Status: AC
  Filled 2020-07-16: qty 20

## 2020-07-16 MED ORDER — FENTANYL CITRATE (PF) 100 MCG/2ML IJ SOLN
INTRAMUSCULAR | Status: AC
  Filled 2020-07-16: qty 2

## 2020-07-16 MED ORDER — MIDAZOLAM HCL 2 MG/2ML IJ SOLN
INTRAMUSCULAR | Status: AC
  Filled 2020-07-16: qty 2

## 2020-07-16 MED ORDER — HEPARIN SODIUM (PORCINE) 1000 UNIT/ML IJ SOLN
INTRAMUSCULAR | Status: AC | PRN
  Administered 2020-07-16: 4000 [IU] via INTRAVENOUS

## 2020-07-16 MED ORDER — SODIUM CHLORIDE 0.9 % IV SOLN
INTRAVENOUS | Status: AC | PRN
  Administered 2020-07-16: 10 mL/h via INTRAVENOUS

## 2020-07-16 NOTE — Progress Notes (Signed)
[facility] Progress Note  Subjective: pt seen in room. Going to IR today. Will have HD afterwards.   Vitals:   07/16/20 0425 07/16/20 0806 07/16/20 0900 07/16/20 1156  BP: (!) 157/43 (!) 146/45  (!) 163/56  Pulse: 85 75  78  Resp: 16 14  10   Temp:  97.7 F (36.5 C)  98.6 F (37 C)  TempSrc: Oral Axillary  Axillary  SpO2: 100%  99% 99%  Weight:        Exam:   Pt lethargic, not in distress  no jvd  Chest cta bilat  Cor reg no RG  Abd soft ntnd no ascites   Ext no LE edema   Alert, NF   RUA AVF no bruit   Temp cath R EJ in place   Home meds:  - neurontin/ cymbalta/ percocet prn  - velphoro 500 bid ac/ sensipar 60 qd  - lipitor 40/ prilosec qd  - prn's/ vitamins/ supplements    OP HD: [place] TTS   4h  62.5kg  2/2 bath RUA AVF  Hep 2000  - mircera 50 q2 last 1/4  - venofer 50 qwk  - hect 5 ug tiw   Assessment/ Plan: 1. AMS - due to uremia from clotted access and missed HD x 2. Temp cath placed 1/12 by IR and had HD x 1 on 1/12. Going to IR today for eval of clotted AVF.  2. Clotted AVF - as above.  3. ESRD - on HD TTS. Missed 2 HD d/t clotted access. Had HD 1/12, plan for HD later today after procedure.  4. BP/ volume - no vol on, BP's dropped w/ attempted UF. 1 kg up by wts.   5. 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 Cont vdra, sensipar, binder   Rob Kiptyn Rafuse 07/16/2020, 1:34 PM   Recent Labs  Lab 07/14/20 2122 07/15/20 0332 07/15/20 0427 07/15/20 1120 07/16/20 0300 07/16/20 1000  K 6.5*  --  5.0   < > 5.7* 5.7*  BUN 129*  --  37*  --  50*  --   CREATININE 17.12*  --  7.86*  --  10.47*  --   CALCIUM 9.4  --  9.0  --  9.4  --   PHOS 6.8*  --  4.0  --   --   --   HGB  --  10.0*  --   --  10.8*  --    < > = values in this interval not displayed.   Inpatient medications: . atorvastatin  40 mg Oral Daily  . Chlorhexidine Gluconate Cloth  6 each Topical Q0600  . cinacalcet  60 mg Oral Q breakfast   . collagenase   Topical Daily  . [START ON 07/17/2020] doxercalciferol  5 mcg Intravenous Q T,Th,Sa-HD  . DULoxetine  60 mg Oral Daily  . feeding supplement (NEPRO CARB STEADY)  237 mL Oral Q T,Th,Sa-HD  . heparin  2,000 Units Dialysis Once in dialysis  . heparin  5,000 Units Subcutaneous Q12H  . insulin aspart  0-6 Units Subcutaneous TID WC  . loratadine  10 mg Oral Daily  . mupirocin ointment  1 application Topical BID  . pantoprazole  40 mg Oral Daily  . sucroferric oxyhydroxide  500 mg Oral BID WC    heparin, hydrALAZINE, ondansetron **OR** ondansetron (ZOFRAN) IV

## 2020-07-16 NOTE — Progress Notes (Signed)
PROGRESS NOTE    Sarah Kelley  BLT:[id number] DOB: 01/31/1951 DOA: 07/14/2020 PCP: [name], MD   Chief Complaint  Patient presents with  . Vascular Access Problem   Brief Narrative: 70yoF w/ ESRD on HD tts, chronic seizure like activity, HTN, IDDM, NEUROPATHY, rt eye blind, presented with increasing lethargic, feeling nausea glucose after missing several dialysis x2 on 07/14/20. As per report- right arm AV fistula clotted-missed 2 sessions of HD and 07/13/20. She last received insulin  1/11 am and in the evening family checked her blood sugar, which was less than 100 and did not give the evening dose of insulin.  Family also noticed increasing twitching like movement of her shoulders and arms which happened in the past when she missed her dialysis. ED Course: Significant uremia with BUN 132, potassium 6.5, creatinine 17.3 glucose 37.  Patient was given hyperkalemia cocktail in the ED, IR and nephrology informed and subclavian access was placed by IR emergently and patient had HD and was admitted  Subjective: Seen this morning she alert awake but not speaking or interacting much. Has good squeeze in bilateral upper extremities, son thinks she sleepy that she is still not interactive.  Son feels she was getting better and she talked to him yesterday.  She is waiting for dialysis this morning.  Assessment & Plan:  Acute encephalopathy multifactorial-uremic 2/2 missed HD X2 and hypoglycemia.  CT head no acute finding, ammonia normal.  Likely from uremic encephalopathy.  Slow to improve, continue with dialysis.  If does not improve soon can consider MRI brain. s/p emergent HD 1/12. Cont fall precaution supportive.    ESRD w. Clotted AVF, on HD TTS/metabolic acidosis/Metabolci bone disease hyperphosphetemia: Nephrology on board input appreciated, continue dialysis as per nephrology, going for HD today.  Recent Labs  Lab 07/14/20 1210 07/14/20 2122 07/15/20 0427 07/16/20 0300   BUN 132* 129* 37* 50*  CREATININE 17.31* 17.12* 7.86* 10.47*   Multiple vascular access problems, clotted aVF.  IR Consulted for fistula declot tpday  [facility] from [facility]  IDDM-w/ hypoglycemia form insulin in the setting of missed HD- last hba1c stable 8.0 on 06/10/21.  Blood sugar remains stable currently, continue sliding scale and monitor CBG.  Recent Labs  Lab 07/15/20 0424 07/15/20 0805 07/15/20 1214 07/15/20 1730 07/15/20 2136  GLUCAP 136* 155* 164* 124* 143*   Hypertension-BP uptrending. Continue as needed IV hydralazine, hopefully can resume p.o. meds  Fluid overload from missed HD-continue dialysis as per nephrology see #1  Hyperkalemia 2/2 missed HD, potassium is still up 5.7-plan per nephrology Recent Labs  Lab 07/15/20 0427 07/15/20 1120 07/15/20 1532 07/15/20 2332 07/16/20 0300  K 5.0 5.9* 5.7* 5.8* 5.7*   GOC: Remains full code.  She does have baseline dementia as per the son.  Discussed w/ nephrology- wondering  whether it is helping this patient with ongoing dialysis-will consult palliative care to discuss further goals of care.  Await for palliative meeting with family  Nutrition: Diet Order            Diet NPO time specified Except for: Sips with Meds  Diet effective midnight                  Body mass index is 28.27 kg/m.  Pressure Ulcer: Pressure Injury 07/15/20 Heel Left Unstageable - Full thickness tissue loss in which the base of the injury is covered by slough (yellow, tan, gray, green or brown) and/or eschar (tan, brown or black) in the wound bed. (Active)  07/15/20   Location: Heel  Location Orientation: Left  Staging: Unstageable - Full thickness tissue loss in which the base of the injury is covered by slough (yellow, tan, gray, green or brown) and/or eschar (tan, brown or black) in the wound bed.  Wound Description (Comments):   Present on Admission: Yes    DVT prophylaxis: heparin injection 5,000 Units Start: 07/14/20  2200 Code Status:   Code Status: Full Code  Family Communication: plan of care discussed with patients son at the bedside  Status is: Inpatient Remains inpatient appropriate because:IV treatments appropriate due to intensity of illness or inability to take PO and Inpatient level of care appropriate due to severity of illness  Dispo: The patient is from: Home              Anticipated d/c is to: Home PTOT once stable              Anticipated d/c date is: 2 days              Patient currently is not medically stable to d/c.  Consultants:see note  Procedures:see note  Culture/Microbiology    Component Value Date/Time   SDES BLOOD RIGHT HAND 02/13/2018 0404   SPECREQUEST  02/13/2018 0404    BOTTLES DRAWN AEROBIC AND ANAEROBIC Blood Culture adequate volume   CULT  02/13/2018 0404    NO GROWTH 5 DAYS Performed at [facility], [place] [address] [place], [place] [address]    REPTSTATUS 02/18/2018 FINAL 02/13/2018 0404    Other culture-see note  Medications: Scheduled Meds: . atorvastatin  40 mg Oral Daily  . Chlorhexidine Gluconate Cloth  6 each Topical Q0600  . cinacalcet  60 mg Oral Q breakfast  . collagenase   Topical Daily  . [START ON 07/17/2020] doxercalciferol  5 mcg Intravenous Q T,Th,Sa-HD  . DULoxetine  60 mg Oral Daily  . feeding supplement (NEPRO CARB STEADY)  237 mL Oral Q T,Th,Sa-HD  . heparin  2,000 Units Dialysis Once in dialysis  . heparin  5,000 Units Subcutaneous Q12H  . insulin aspart  0-6 Units Subcutaneous TID WC  . loratadine  10 mg Oral Daily  . mupirocin ointment  1 application Topical BID  . pantoprazole  40 mg Oral Daily  . sucroferric oxyhydroxide  500 mg Oral BID WC   Continuous Infusions:  Antimicrobials: Anti-infectives (From admission, onward)   None     Objective: Vitals: Today's Vitals   07/15/20 2138 07/15/20 2344 07/16/20 0016 07/16/20 0425  BP: (!) 167/68 (!) 178/65 (!) 124/46 (!) 157/43  Pulse: 77 78 84 85  Resp: _0 Temp: 98.5 F (36.9 C) 98.5 F (36.9 C)    TempSrc: Oral Oral  Oral  SpO2: 98% 100% 100% 100%  Weight:      PainSc:        Intake/Output Summary (Last 24 hours) at 07/16/2020 0759 Last data filed at 07/15/2020 1300 Gross per 24 hour  Intake 0 ml  Output -  Net 0 ml   Filed Weights   07/14/20 2110 07/15/20 0230 07/15/20 0427  Weight: 67 kg 63.5 kg 63.5 kg   Weight change:   Intake/Output from previous day: No intake/output data recorded. Intake/Output this shift: No intake/output data recorded.  Examination: General exam: eye open-did not speak, mumbles words?, weak appearing. HEENT:Oral mucosa moist, Ear/Nose WNL grossly, dentition normal. Respiratory system: bilaterally clear,no wheezing or crackles,no use of accessory muscle Cardiovascular system: S1 & S2 +,  No JVD,. Gastrointestinal system: Abdomen soft, NT,ND, BS+ Nervous System:Alert, awake, moving UE well. Non focal UE, following some commands. Extremities: No edema, distal peripheral pulses palpable.  Skin: No rashes,no icterus. MSK: Normal muscle bulk,tone, power  Data Reviewed: I have personally reviewed following labs and imaging studies CBC: Recent Labs  Lab 07/14/20 1210 07/15/20 0332 07/16/20 0300  WBC 7.0 5.3 6.3  HGB 11.7* 10.0* 10.8*  HCT 39.0 31.9* 35.3*  MCV 104.6* 100.6* 101.4*  PLT 204 180 676   Basic Metabolic Panel: Recent Labs  Lab 07/14/20 1210 07/14/20 1932 07/14/20 2122 07/15/20 0427 07/15/20 1120 07/15/20 1532 07/15/20 2332 07/16/20 0300  NA 143  --  142 137  --   --   --  137  K 6.5*   < > 6.5* 5.0 5.9* 5.7* 5.8* 5.7*  CL 102  --  100 97*  --   --   --  98  CO2 20*  --  24 27  --   --   --  25  GLUCOSE 37*  --  114* 146*  --   --   --  172*  BUN 132*  --  129* 37*  --   --   --  50*  CREATININE 17.31*  --  17.12* 7.86*  --   --   --  10.47*  CALCIUM 9.8  --  9.4 9.0  --   --   --  9.4  PHOS  --   --  6.8* 4.0  --   --   --   --    < > = values in this interval not  displayed.   GFR: Estimated Creatinine Clearance: 4 mL/min (A) (by C-G formula based on SCr of 10.47 mg/dL (H)). Liver Function Tests: Recent Labs  Lab 07/14/20 2122 07/15/20 0427  ALBUMIN 2.7* 2.7*   No results for input(s): LIPASE, AMYLASE in the last 168 hours. Recent Labs  Lab 07/15/20 1120  AMMONIA 23   Coagulation Profile: Recent Labs  Lab 07/16/20 0300  INR 1.2   Cardiac Enzymes: No results for input(s): CKTOTAL, CKMB, CKMBINDEX, TROPONINI in the last 168 hours. BNP (last 3 results) No results for input(s): PROBNP in the last 8760 hours. HbA1C: No results for input(s): HGBA1C in the last 72 hours. CBG: Recent Labs  Lab 07/15/20 0424 07/15/20 0805 07/15/20 1214 07/15/20 1730 07/15/20 2136  GLUCAP 136* 155* 164* 124* 143*   Lipid Profile: No results for input(s): CHOL, HDL, LDLCALC, TRIG, CHOLHDL, LDLDIRECT in the last 72 hours. Thyroid Function Tests: No results for input(s): TSH, T4TOTAL, FREET4, T3FREE, THYROIDAB in the last 72 hours. Anemia Panel: No results for input(s): VITAMINB12, FOLATE, FERRITIN, TIBC, IRON, RETICCTPCT in the last 72 hours. Sepsis Labs: No results for input(s): PROCALCITON, LATICACIDVEN in the last 168 hours.  Recent Results (from the past 240 hour(s))  Resp panel by RT-PCR (RSV, Flu A&B, Covid) Nasopharyngeal Swab     Status: None   Collection Time: 07/14/20  2:06 PM   Specimen: Nasopharyngeal Swab; Nasopharyngeal(NP) swabs in vial transport medium  Result Value Ref Range Status   SARS Coronavirus 2 by RT PCR NEGATIVE NEGATIVE Final    Comment: (NOTE) SARS-CoV-2 target nucleic acids are NOT DETECTED.  The SARS-CoV-2 RNA is generally detectable in upper respiratory specimens during the acute phase of infection. The lowest concentration of SARS-CoV-2 viral copies this assay can detect is 138 copies/mL. A negative result does not preclude SARS-Cov-2 infection and should not be used as  the sole basis for treatment or other  patient management decisions. A negative result may occur with  improper specimen collection/handling, submission of specimen other than nasopharyngeal swab, presence of viral mutation(s) within the areas targeted by this assay, and inadequate number of viral copies(<138 copies/mL). A negative result must be combined with clinical observations, patient history, and epidemiological information. The expected result is Negative.  Fact Sheet for Patients:  EntrepreneurPulse.com.au  Fact Sheet for Healthcare Providers:  IncredibleEmployment.be  This test is no t yet approved or cleared by the [place] FDA and  has been authorized for detection and/or diagnosis of SARS-CoV-2 by FDA under an Emergency Use Authorization (EUA). This EUA will remain  in effect (meaning this test can be used) for the duration of the COVID-19 declaration under Section 564(b)(1) of the Act, 21 U.S.C.section 360bbb-3(b)(1), unless the authorization is terminated  or revoked sooner.       Influenza A by PCR NEGATIVE NEGATIVE Final   Influenza B by PCR NEGATIVE NEGATIVE Final    Comment: (NOTE) The Xpert Xpress SARS-CoV-2/FLU/RSV plus assay is intended as an aid in the diagnosis of influenza from Nasopharyngeal swab specimens and should not be used as a sole basis for treatment. Nasal washings and aspirates are unacceptable for Xpert Xpress SARS-CoV-2/FLU/RSV testing.  Fact Sheet for Patients: EntrepreneurPulse.com.au  Fact Sheet for Healthcare Providers: IncredibleEmployment.be  This test is not yet approved or cleared by the [place] FDA and has been authorized for detection and/or diagnosis of SARS-CoV-2 by FDA under an Emergency Use Authorization (EUA). This EUA will remain in effect (meaning this test can be used) for the duration of the COVID-19 declaration under Section 564(b)(1) of the Act, 21 U.S.C. section  360bbb-3(b)(1), unless the authorization is terminated or revoked.     Resp Syncytial Virus by PCR NEGATIVE NEGATIVE Final    Comment: (NOTE) Fact Sheet for Patients: EntrepreneurPulse.com.au  Fact Sheet for Healthcare Providers: IncredibleEmployment.be  This test is not yet approved or cleared by the [place] FDA and has been authorized for detection and/or diagnosis of SARS-CoV-2 by FDA under an Emergency Use Authorization (EUA). This EUA will remain in effect (meaning this test can be used) for the duration of the COVID-19 declaration under Section 564(b)(1) of the Act, 21 U.S.C. section 360bbb-3(b)(1), unless the authorization is terminated or revoked.  Performed at [facility], [place] [address] [place], [place] [address]      Radiology Studies: CT HEAD WO CONTRAST  Result Date: 07/15/2020 CLINICAL DATA:  70 year old female with uremic encephalopathy. Failed dialysis fistula, missed hemodialysis. EXAM: CT HEAD WITHOUT CONTRAST TECHNIQUE: Contiguous axial images were obtained from the base of the skull through the vertex without intravenous contrast. COMPARISON:  Brain MRI 04/17/2017.  Head CT 02/14/2018. FINDINGS: Brain: Stable cerebral volume since 2019. Post ischemic encephalomalacia in the right external capsule, right cerebellum right parietal lobe. Additional chronic small vessel disease changes in the bilateral basal ganglia and pons appear progressed since 2019. Patchy additional bilateral white matter hypodensity has also somewhat increased. No superimposed midline shift, ventriculomegaly, mass effect, evidence of mass lesion, intracranial hemorrhage or evidence of cortically based acute infarction. No new areas of cortical encephalomalacia. Vascular: Extensive Calcified atherosclerosis at the skull base. No suspicious intracranial vascular hyperdensity. Skull: No acute osseous abnormality identified. Sinuses/Orbits: Chronic  sphenoid sinus disease has improved since 2019. Other Visualized paranasal sinuses and mastoids are stable and well pneumatized. Other: Stable orbit and scalp soft tissues. Postoperative changes to both globes. IMPRESSION:  1. No acute intracranial abnormality identified. 2. Advanced chronic ischemic disease appears progressed since 2019 in the bilateral deep gray nuclei and pons. Electronically Signed   By: Genevie Ann M.D.   On: 07/15/2020 07:00   IR Fluoro Guide CV Line Right  Result Date: 07/14/2020 INDICATION: 70 year old female with end-stage renal disease. EXAM: NON-TUNNELED CENTRAL VENOUS HEMODIALYSIS CATHETER PLACEMENT WITH ULTRASOUND AND FLUOROSCOPIC GUIDANCE COMPARISON:  None. MEDICATIONS: None FLUOROSCOPY TIME:  0.1 minutes, (0 mGy) COMPLICATIONS: None immediate. PROCEDURE: Informed written consent was obtained from the patient after a discussion of the risks, benefits, and alternatives to treatment. Questions regarding the procedure were encouraged and answered. The right neck and chest were prepped with chlorhexidine in a sterile fashion, and a sterile drape was applied covering the operative field. Maximum barrier sterile technique with sterile gowns and gloves were used for the procedure. A timeout was performed prior to the initiation of the procedure. After the overlying soft tissues were anesthetized, a small venotomy incision was created and a micropuncture kit was utilized to access the external jugular vein. Real-time ultrasound guidance was utilized for vascular access including the acquisition of a permanent ultrasound image documenting patency of the accessed vessel. The microwire was utilized to measure appropriate catheter length. A stiff glidewire was advanced to the level of the IVC. Under fluoroscopic guidance, the venotomy was serially dilated, ultimately allowing placement of a 16 cm temporary Trialysis catheter with tip ultimately terminating within the superior aspect of the right  atrium. Final catheter positioning was confirmed and documented with a spot radiographic image. The catheter aspirates and flushes normally. The catheter was flushed with appropriate volume heparin dwells. The catheter exit site was secured with a 0-Prolene retention suture. A dressing was placed. The patient tolerated the procedure well without immediate post procedural complication. IMPRESSION: Successful placement of a right external jugular approach 16 cm temporary dialysis catheter with tip terminating with in the superior aspect of the right atrium. The catheter is ready for immediate use. PLAN: This catheter may be converted to a tunneled dialysis catheter at a later date as indicated. Ruthann Cancer, MD [facility] Electronically Signed   By: Ruthann Cancer MD   On: 07/14/2020 17:49   IR US Guide Vasc Access Right  Result Date: 07/14/2020 INDICATION: 70 year old female with end-stage renal disease. EXAM: NON-TUNNELED CENTRAL VENOUS HEMODIALYSIS CATHETER PLACEMENT WITH ULTRASOUND AND FLUOROSCOPIC GUIDANCE COMPARISON:  None. MEDICATIONS: None FLUOROSCOPY TIME:  0.1 minutes, (0 mGy) COMPLICATIONS: None immediate. PROCEDURE: Informed written consent was obtained from the patient after a discussion of the risks, benefits, and alternatives to treatment. Questions regarding the procedure were encouraged and answered. The right neck and chest were prepped with chlorhexidine in a sterile fashion, and a sterile drape was applied covering the operative field. Maximum barrier sterile technique with sterile gowns and gloves were used for the procedure. A timeout was performed prior to the initiation of the procedure. After the overlying soft tissues were anesthetized, a small venotomy incision was created and a micropuncture kit was utilized to access the external jugular vein. Real-time ultrasound guidance was utilized for vascular access including the  acquisition of a permanent ultrasound image documenting patency of the accessed vessel. The microwire was utilized to measure appropriate catheter length. A stiff glidewire was advanced to the level of the IVC. Under fluoroscopic guidance, the venotomy was serially dilated, ultimately allowing placement of a 16 cm temporary Trialysis catheter with tip ultimately terminating within the superior aspect  of the right atrium. Final catheter positioning was confirmed and documented with a spot radiographic image. The catheter aspirates and flushes normally. The catheter was flushed with appropriate volume heparin dwells. The catheter exit site was secured with a 0-Prolene retention suture. A dressing was placed. The patient tolerated the procedure well without immediate post procedural complication. IMPRESSION: Successful placement of a right external jugular approach 16 cm temporary dialysis catheter with tip terminating with in the superior aspect of the right atrium. The catheter is ready for immediate use. PLAN: This catheter may be converted to a tunneled dialysis catheter at a later date as indicated. Ruthann Cancer, MD [facility] Electronically Signed   By: Ruthann Cancer MD   On: 07/14/2020 17:49     LOS: 2 days   Antonieta Pert, MD [facility]  07/16/2020, 7:59 AM

## 2020-07-16 NOTE — Evaluation (Signed)
Occupational Therapy Evaluation Patient Details Name: Sarah Kelley MRN: [id number] DOB: 11/09/1950 Today's Date: 07/16/2020    History of Present Illness Pt adm with acute encephalopathy due to uremia, missed HD, and hypoglycemia. AV fistula clotted. PMH - ESRD on HD, chronic seizure like activity, HTN, DM, neuropathy, rt eye blind.   Clinical Impression   Pt was assisted for bed mobility, transfers and short distance ambulation prior to admission. She self fed with set up and participated in grooming, but was assisted for bathing and dressing per her son. Pt presents with lethargy, but did follow simple commands with repetition and increased time. Pt is currently dependent in all mobility and ADL. Supportive son at bedside plans to take pt home upon discharge. Son and video interpreter assisted with communication. Pt non verbal during session. Will follow acutely.   Follow Up Recommendations  Home health OT;Supervision/Assistance - 24 hour    Equipment Recommendations  None recommended by OT    Recommendations for Other Services       Precautions / Restrictions Precautions Precautions: Fall      Mobility Bed Mobility Overal bed mobility: Needs Assistance Bed Mobility: Rolling;Supine to Sit;Sit to Supine Rolling: Total assist   Supine to sit: Total assist Sit to supine: Total assist   General bed mobility comments: assist for all aspects, rolled for pericare    Transfers                 General transfer comment: Did not attempt due to pt lethargic    Balance Overall balance assessment: Needs assistance Sitting-balance support: Bilateral upper extremity supported;Feet unsupported Sitting balance-Leahy Scale: Poor Sitting balance - Comments: min assist                                   ADL either performed or assessed with clinical judgement   ADL                                         General ADL Comments: requires  total assist     Vision Patient Visual Report: No change from baseline Additional Comments: blind in R eye     Perception     Praxis      Pertinent Vitals/Pain Pain Assessment: Faces Faces Pain Scale: Hurts a little bit Pain Location: generalized Pain Descriptors / Indicators: Moaning Pain Intervention(s): Repositioned;Monitored during session     Hand Dominance Right   Extremity/Trunk Assessment Upper Extremity Assessment Upper Extremity Assessment: Generalized weakness (squeezed RN's hand, no other functional movement)   Lower Extremity Assessment Lower Extremity Assessment: Defer to PT evaluation   Cervical / Trunk Assessment Cervical / Trunk Assessment: Other exceptions (weakness)   Communication Communication Communication: Prefers language other than English (Spanish interpreter and son assisted)   Cognition Arousal/Alertness: Lethargic Behavior During Therapy: Flat affect                                   General Comments: non verbal, following simple commands with increased time inconsistently   General Comments       Exercises     Shoulder Instructions      Home Living Family/patient expects to be discharged to:: Private residence Living Arrangements: Children Available Help at Discharge: Family;Available 24 hours/day Type  of Home: Mobile home Home Access: Ramped entrance     Home Layout: One level     Bathroom Shower/Tub: Occupational psychologist: Standard     Home Equipment: Environmental consultant - 2 wheels;Cane - single point;Bedside commode;Wheelchair - manual          Prior Functioning/Environment Level of Independence: Needs assistance  Gait / Transfers Assistance Needed: Assist for bed mobility, transfers and very short distance ambulation. ADL's / Homemaking Assistance Needed: assisted for bathing and dressing, self feeds and participates in grooming   Comments: information gathered through son        OT Problem  List: Decreased strength;Decreased activity tolerance;Impaired balance (sitting and/or standing);Decreased knowledge of use of DME or AE;Decreased cognition;Decreased coordination;Pain;Impaired UE functional use      OT Treatment/Interventions: Self-care/ADL training;DME and/or AE instruction;Therapeutic activities;Patient/family education;Balance training;Cognitive remediation/compensation    OT Goals(Current goals can be found in the care plan section) Acute Rehab OT Goals Patient Stated Goal: son wishes to take pt home at discharge OT Goal Formulation: With family Time For Goal Achievement: 07/30/20 Potential to Achieve Goals: Fair ADL Goals Pt Will Perform Eating: with min assist;sitting Pt Will Perform Grooming: with min assist;sitting Pt Will Transfer to Toilet: with mod assist;stand pivot transfer;bedside commode Additional ADL Goal #1: Pt will demonstrate sustained attention in preparation for ADL. Additional ADL Goal #2: Pt will perform sit EOB x 10 min with supervision in preparation for ADL. Additional ADL Goal #3: Pt will follow one step commands within 5 seconds of request with 75% accuracy.  OT Frequency: Min 2X/week   Barriers to D/C:            Co-evaluation              AM-PAC OT "6 Clicks" Daily Activity     Outcome Measure Help from another person eating meals?: Total Help from another person taking care of personal grooming?: Total Help from another person toileting, which includes using toliet, bedpan, or urinal?: Total Help from another person bathing (including washing, rinsing, drying)?: Total Help from another person to put on and taking off regular upper body clothing?: Total Help from another person to put on and taking off regular lower body clothing?: Total 6 Click Score: 6   End of Session Nurse Communication: Mobility status  Activity Tolerance: Patient limited by lethargy Patient left: in bed;with call bell/phone within reach;with  family/visitor present;with nursing/sitter in room  OT Visit Diagnosis: Muscle weakness (generalized) (M62.81);Other symptoms and signs involving cognitive function;Pain                Time: 3474-2595 OT Time Calculation (min): 15 min Charges:  OT General Charges $OT Visit: 1 Visit OT Evaluation $OT Eval Moderate Complexity: 1 Mod  Nestor Lewandowsky, OTR/L Acute Rehabilitation Services Pager: [phone] Office: [phone] Malka So 07/16/2020, 9:55 AM

## 2020-07-16 NOTE — Progress Notes (Signed)
Potassium for 1930 was not drawn.  Spoke with Miquel Dunn from the lab, lab thought the patient was a nurse draw.  Potassium level drawn at 22:40.  Lab will redraw another Potassium level per MD orders in 4hrs.      Rico Sheehan, RN

## 2020-07-16 NOTE — Procedures (Signed)
Pre procedural Dx: ESRD; clotted AV fistula Post procedural Dx: Same.   Technically successful left upper arm brachiocephalic AV fistula thrombolysis.  EBL: Trace Complications: None immediate   ACCESS: - Recommend maintaining the non tunneled right jugular approach dialysis catheter until at least two sessions of successful dialysis are achieved via the upper arm fistula.  - Assuming restored functionality and patency, the AV fistula would be amenable to future percutaneous intervention as clinically indicated.   Ronny Bacon, MD Pager #: [phone]

## 2020-07-17 LAB — CBC
HCT: 33.3 % — ABNORMAL LOW (ref 36.0–46.0)
Hemoglobin: 10.2 g/dL — ABNORMAL LOW (ref 12.0–15.0)
MCH: 31.3 pg (ref 26.0–34.0)
MCHC: 30.6 g/dL (ref 30.0–36.0)
MCV: 102.1 fL — ABNORMAL HIGH (ref 80.0–100.0)
Platelets: 145 10*3/uL — ABNORMAL LOW (ref 150–400)
RBC: 3.26 MIL/uL — ABNORMAL LOW (ref 3.87–5.11)
RDW: 14.5 % (ref 11.5–15.5)
WBC: 5.3 10*3/uL (ref 4.0–10.5)
nRBC: 0 % (ref 0.0–0.2)

## 2020-07-17 LAB — BASIC METABOLIC PANEL
Anion gap: 14 (ref 5–15)
BUN: 20 mg/dL (ref 8–23)
CO2: 27 mmol/L (ref 22–32)
Calcium: 8.5 mg/dL — ABNORMAL LOW (ref 8.9–10.3)
Chloride: 94 mmol/L — ABNORMAL LOW (ref 98–111)
Creatinine, Ser: 4.56 mg/dL — ABNORMAL HIGH (ref 0.44–1.00)
GFR, Estimated: 10 mL/min — ABNORMAL LOW (ref >60)
Glucose, Bld: 116 mg/dL — ABNORMAL HIGH (ref 70–99)
Potassium: 3.3 mmol/L — ABNORMAL LOW (ref 3.5–5.1)
Sodium: 135 mmol/L (ref 135–145)

## 2020-07-17 LAB — GLUCOSE, CAPILLARY
Glucose-Capillary: 143 mg/dL — ABNORMAL HIGH (ref 70–99)
Glucose-Capillary: 172 mg/dL — ABNORMAL HIGH (ref 70–99)
Glucose-Capillary: 113 mg/dL — ABNORMAL HIGH (ref 70–99)
Glucose-Capillary: 183 mg/dL — ABNORMAL HIGH (ref 70–99)

## 2020-07-17 LAB — POTASSIUM
Potassium: 3.6 mmol/L (ref 3.5–5.1)
Potassium: 3.8 mmol/L (ref 3.5–5.1)
Potassium: 4 mmol/L (ref 3.5–5.1)

## 2020-07-17 MED ORDER — HEPARIN SODIUM (PORCINE) 1000 UNIT/ML IJ SOLN
INTRAMUSCULAR | Status: AC
  Filled 2020-07-17: qty 4

## 2020-07-17 NOTE — Progress Notes (Signed)
Patient returned to the floor from dialysis.   Rico Sheehan, RN

## 2020-07-17 NOTE — Progress Notes (Signed)
Patient transported off the floor via bed to dialysis by dialysis staff nurse.   Rico Sheehan, RN

## 2020-07-17 NOTE — Progress Notes (Addendum)
K+ result dated 07/17/20 at 3:00 has incorrect time. This lab was actually drawn 07/16/20 at 22:40 & resulted at 23:14.

## 2020-07-17 NOTE — Progress Notes (Addendum)
Subjective:  Seen in rm , had HD early a.m., requiring mittens for confusion, alert but no voiced complaint to me  Objective Vital signs in last 24 hours: Vitals:   07/17/20 0436 07/17/20 0451 07/17/20 0536 07/17/20 0615  BP: (!) 99/43 (!) 114/42  (!) 125/50  Pulse: 86 84  94  Resp: _0 Temp:   98 F (36.7 C) 98.4 F (36.9 C)  TempSrc:    Oral  SpO2: 100% 98%  96%  Weight:       Weight change:   Physical Exam: General: Elderly Hispanic female, NAD Heart: RRR no MRG Lungs: CTA room air Abdomen: Obesity bs  normoactive, soft nontender nondistended Extremities: No pedal edema ialysis Access: Left upper arm AV fistula positive bruit, right IJ temp HD cath  Home meds: - neurontin/ cymbalta/ percocet prn - velphoro 500 bid ac/ sensipar 60 qd - lipitor 40/ prilosec qd - prn's/ vitamins/ supplements   OP PN:TIRWE TTS 4h 62.5kg 2/2 bath RUA AVF Hep 2000 - mircera 50 q2 last 1/4 - venofer 50 qwk - hect 5 ug tiw   Problem/Plan; 1. AMS - due to uremia from clotted access and missed HD x 2 (has some dementia baseline).,  Less confused this a.m. had hemodialysis late last night, temp cath placed 1/12 by IR , 1/14 IR declot left AVF.  2. Clotted AVF - as above.  Status post IR declot yesterday 3. ESRD - on HD TTS. Missed 2 HD d/t clotted access. Had HD 1/12 and again late last night. No need HD today since just came off this am. Next HD 1/18, possibly 1/17 w/ clinically indicated.  4. BP/ volume - no vol on, BP's dropped w/ attempted UF 1st hd here. +1 kg   5. 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 Cont vdra, sensipar, binder  Ernest Haber, PA-C [facility] Beeper [phone] 07/17/2020,9:09 AM  LOS: 3 days    Pt seen, examined and agree w assess/plan as above with additions as indicated.  [facility] 07/17/2020, 2:08 PM       Labs: Basic Metabolic Panel: Recent Labs  Lab  07/14/20 2122 07/15/20 0427 07/15/20 1120 07/16/20 0300 07/16/20 1000 07/16/20 1532 07/17/20 0300 07/17/20 0332  NA 142 137  --  137  --   --   --  135  K 6.5* 5.0   < > 5.7*   < > 5.9* 6.1* 3.3*  CL 100 97*  --  98  --   --   --  94*  CO2 24 27  --  25  --   --   --  27  GLUCOSE 114* 146*  --  172*  --   --   --  116*  BUN 129* 37*  --  50*  --   --   --  20  CREATININE 17.12* 7.86*  --  10.47*  --   --   --  4.56*  CALCIUM 9.4 9.0  --  9.4  --   --   --  8.5*  PHOS 6.8* 4.0  --   --   --   --   --   --    < > = values in this interval not displayed.   Liver Function Tests: Recent Labs  Lab 07/14/20 2122 07/15/20 0427  ALBUMIN 2.7* 2.7*   No results for input(s): LIPASE,  AMYLASE in the last 168 hours. Recent Labs  Lab 07/15/20 1120  AMMONIA 23   CBC: Recent Labs  Lab 07/14/20 1210 07/15/20 0332 07/16/20 0300 07/17/20 0332  WBC 7.0 5.3 6.3 5.3  HGB 11.7* 10.0* 10.8* 10.2*  HCT 39.0 31.9* 35.3* 33.3*  MCV 104.6* 100.6* 101.4* 102.1*  PLT 204 180 175 145*   Cardiac Enzymes: No results for input(s): CKTOTAL, CKMB, CKMBINDEX, TROPONINI in the last 168 hours. CBG: Recent Labs  Lab 07/16/20 0802 07/16/20 1153 07/16/20 1641 07/16/20 2140 07/17/20 0758  GLUCAP 163* 150* 115* 155* 143*    Studies/Results: IR US Guide Vasc Access Right  Result Date: 07/16/2020 INDICATION: Clotted AV fistula. 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 Note, patient also underwent placement of a temporary hemodialysis catheter 07/14/2020. EXAM: 1. FISTULALYSIS 2. ANGIOPLASTY OF VENOUS LIMB AND VENOUS ANASTOMOSIS 3. ULTRASOUND GUIDANCE FOR VENOUS ACCESS COMPARISON:  Right upper arm fistulagram with intervention-04/21/2020 Image guided placement of a temporary dialysis catheter-07/14/2020 MEDICATIONS: Heparin 400 units IV; TPA 4 mg into the fistula. CONTRAST:  25 cc Omnipaque 300  ANESTHESIA/SEDATION: Moderate (conscious) sedation was employed during this procedure. A total of Versed 1 mg and Fentanyl 50 mcg was administered intravenously. Moderate Sedation Time: 63 minutes. The patient's level of consciousness and vital signs were monitored continuously by radiology nursing throughout the procedure under my direct supervision. FLUOROSCOPY TIME:  9 minutes, 18 seconds (25 mGy) COMPLICATIONS: None immediate. TECHNIQUE: Informed written consent was obtained from the patient after a discussion of the risks, benefits and alternatives to treatment. Questions regarding the procedure were encouraged and answered. A timeout was performed prior to the initiation of the procedure. On physical examination, the existing right upper arm arm dialysis graft was negative for palpable pulse or thrill. The skin overlying the graft was prepped and draped in the usual sterile fashion, and a sterile drape was applied covering the operative field. Maximum barrier sterile technique with sterile gowns and gloves were used for the procedure. Under ultrasound guidance, the dialysis fistula was accessed directed towards the venous anastomosis with a micropuncture kit after the overlying soft tissues were anesthetized with 1% lidocaine. An ultrasound image was saved for documentation purposes. The micropuncture sheath was exchange for a 7-French vascular sheath over a guidewire. Over a Benson wire, a Kumpe catheter was advanced centrally and a central venogram was performed. Pull back venogram was performed with the Kumpe catheter. Heparin was administered systemically and TPA was administered via the Kumpe catheter throughout near the entirety of the venous limb. The majority of the venous limb was angioplastied initially with a 6 mm x 4 cm Conquest balloon and ultimately with a 7 mm x 4 cm Conquest balloon. An additional access was obtained directed towards the arterial anastomoses with a micropuncture kit after the  overlying soft tissues anesthetized with 1% lidocaine. This allowed for placement of a 6-French vascular sheath. The graft was thrombectomized with several rounds of push-pull mechanical thrombectomy with an occlusion balloon. Flow was restored to the graft as evidenced by restored pulsatility of the graft and brisk blood return from the side arm of the vascular sheath. Shuntograms were performed. Several additional rounds of post fall thrombectomy was performed with the use the occlusion balloon followed by additional angioplasty to 7 mm diameter at multiple stations along the access site. Completion fistulagram images were obtained and the procedure was terminated At this point, the procedure was terminated. All wires, catheters  and sheaths were removed from the patient. Hemostasis was achieved at both access sites with deployment of a swizzle sutures which will be removed at the patient's next dialysis session. Dressings were applied. The patient tolerated the procedure without immediate postprocedural complication. FINDINGS: The existing left upper arm brachial-cephalic AV fistula thrombosed to the left of the proximal third of the humerus. The cephalic arch is noted to be patent. Near the entirety of the venous limb was ultimately angioplastied to 7 mm diameter. The fistula was successfully thrombectomized using mechanical and pharmacologic means as above. Initial post lysis fistulagram images demonstrate a minimal amount of adherent nonocclusive thrombus within the stick zone of the fistula. The adherent thrombus was then treated with several additional rounds of post pole thrombectomy with the occlusion device as well as prolonged angioplasty with a 7 mm diameter Conquest balloon. Completion fistulagram images demonstrate wide patency of the AV fistula through the level of the cephalic arch. Reflux fistulagram demonstrates wide patency of the arterial limb and anastomosis. The central venous system of the  left upper extremity is widely patent to the level of the superior aspect of the right atrium. IMPRESSION: 1. Technically successful left upper arm brachiocephalic AV fistula thrombolysis. 2. Successful angioplasty of the venous limb to 7 mm diameter. Completion shuntogram demonstrates the venous limb is widely patent. 3. The arterial anastomosis and arterial limb are widely patent. 4. The central venous system is widely patent. ACCESS: - Recommend maintaining the non tunneled right jugular approach dialysis catheter until at least two sessions of successful dialysis are achieved via the upper arm fistula. - Assuming restored functionality and patency, the AV fistula would be amenable to future percutaneous intervention as clinically indicated. Electronically Signed   By: Simonne Come M.D.   On: 07/16/2020 16:27   IR THROMBECTOMY AV FISTULA W/THROMBOLYSIS INC/SHUNT/IMG RIGHT  Result Date: 07/16/2020 INDICATION: Clotted AV fistula. 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 Note, patient also underwent placement of a temporary hemodialysis catheter 07/14/2020. EXAM: 1. FISTULALYSIS 2. ANGIOPLASTY OF VENOUS LIMB AND VENOUS ANASTOMOSIS 3. ULTRASOUND GUIDANCE FOR VENOUS ACCESS COMPARISON:  Right upper arm fistulagram with intervention-04/21/2020 Image guided placement of a temporary dialysis catheter-07/14/2020 MEDICATIONS: Heparin 400 units IV; TPA 4 mg into the fistula. CONTRAST:  25 cc Omnipaque 300 ANESTHESIA/SEDATION: Moderate (conscious) sedation was employed during this procedure. A total of Versed 1 mg and Fentanyl 50 mcg was administered intravenously. Moderate Sedation Time: 63 minutes. The patient's level of consciousness and vital signs were monitored continuously by radiology nursing throughout the procedure under my direct supervision. FLUOROSCOPY TIME:  9 minutes, 18 seconds (25 mGy) COMPLICATIONS: None  immediate. TECHNIQUE: Informed written consent was obtained from the patient after a discussion of the risks, benefits and alternatives to treatment. Questions regarding the procedure were encouraged and answered. A timeout was performed prior to the initiation of the procedure. On physical examination, the existing right upper arm arm dialysis graft was negative for palpable pulse or thrill. The skin overlying the graft was prepped and draped in the usual sterile fashion, and a sterile drape was applied covering the operative field. Maximum barrier sterile technique with sterile gowns and gloves were used for the procedure. Under ultrasound guidance, the dialysis fistula was accessed directed towards the venous anastomosis with a micropuncture kit after the overlying soft tissues were anesthetized with 1% lidocaine. An ultrasound image was saved for documentation purposes. The micropuncture sheath was exchange for a  7-French vascular sheath over a guidewire. Over a Benson wire, a Kumpe catheter was advanced centrally and a central venogram was performed. Pull back venogram was performed with the Kumpe catheter. Heparin was administered systemically and TPA was administered via the Kumpe catheter throughout near the entirety of the venous limb. The majority of the venous limb was angioplastied initially with a 6 mm x 4 cm Conquest balloon and ultimately with a 7 mm x 4 cm Conquest balloon. An additional access was obtained directed towards the arterial anastomoses with a micropuncture kit after the overlying soft tissues anesthetized with 1% lidocaine. This allowed for placement of a 6-French vascular sheath. The graft was thrombectomized with several rounds of push-pull mechanical thrombectomy with an occlusion balloon. Flow was restored to the graft as evidenced by restored pulsatility of the graft and brisk blood return from the side arm of the vascular sheath. Shuntograms were performed. Several additional  rounds of post fall thrombectomy was performed with the use the occlusion balloon followed by additional angioplasty to 7 mm diameter at multiple stations along the access site. Completion fistulagram images were obtained and the procedure was terminated At this point, the procedure was terminated. All wires, catheters and sheaths were removed from the patient. Hemostasis was achieved at both access sites with deployment of a swizzle sutures which will be removed at the patient's next dialysis session. Dressings were applied. The patient tolerated the procedure without immediate postprocedural complication. FINDINGS: The existing left upper arm brachial-cephalic AV fistula thrombosed to the left of the proximal third of the humerus. The cephalic arch is noted to be patent. Near the entirety of the venous limb was ultimately angioplastied to 7 mm diameter. The fistula was successfully thrombectomized using mechanical and pharmacologic means as above. Initial post lysis fistulagram images demonstrate a minimal amount of adherent nonocclusive thrombus within the stick zone of the fistula. The adherent thrombus was then treated with several additional rounds of post pole thrombectomy with the occlusion device as well as prolonged angioplasty with a 7 mm diameter Conquest balloon. Completion fistulagram images demonstrate wide patency of the AV fistula through the level of the cephalic arch. Reflux fistulagram demonstrates wide patency of the arterial limb and anastomosis. The central venous system of the left upper extremity is widely patent to the level of the superior aspect of the right atrium. IMPRESSION: 1. Technically successful left upper arm brachiocephalic AV fistula thrombolysis. 2. Successful angioplasty of the venous limb to 7 mm diameter. Completion shuntogram demonstrates the venous limb is widely patent. 3. The arterial anastomosis and arterial limb are widely patent. 4. The central venous system is  widely patent. ACCESS: - Recommend maintaining the non tunneled right jugular approach dialysis catheter until at least two sessions of successful dialysis are achieved via the upper arm fistula. - Assuming restored functionality and patency, the AV fistula would be amenable to future percutaneous intervention as clinically indicated. Electronically Signed   By: [name] M.D.   On: 07/16/2020 16:27   Medications:  . atorvastatin  40 mg Oral Daily  . Chlorhexidine Gluconate Cloth  6 each Topical Q0600  . cinacalcet  60 mg Oral Q breakfast  . collagenase   Topical Daily  . doxercalciferol  5 mcg Intravenous Q T,Th,Sa-HD  . DULoxetine  60 mg Oral Daily  . feeding supplement (NEPRO CARB STEADY)  237 mL Oral Q T,Th,Sa-HD  . heparin  5,000 Units Subcutaneous Q12H  . heparin sodium (porcine)      .  insulin aspart  0-6 Units Subcutaneous TID WC  . loratadine  10 mg Oral Daily  . mupirocin ointment  1 application Topical BID  . pantoprazole  40 mg Oral Daily  . sucroferric oxyhydroxide  500 mg Oral BID WC

## 2020-07-17 NOTE — Progress Notes (Signed)
PROGRESS NOTE    Sarah Kelley  GYI:[id number] DOB: 1950-11-11 DOA: 07/14/2020 PCP: [name], MD   Chief Complaint  Patient presents with  . Vascular Access Problem   Brief Narrative: 21yoF w/ ESRD on HD tts, chronic seizure like activity, HTN, IDDM, NEUROPATHY, rt eye blind, presented with increasing lethargic, feeling nausea glucose after missing several dialysis x2 on 07/14/20. As per report- right arm AV fistula clotted-missed 2 sessions of HD and 07/13/20. She last received insulin  1/11 am and in the evening family checked her blood sugar, which was less than 100 and did not give the evening dose of insulin.  Family also noticed increasing twitching like movement of her shoulders and arms which happened in the past when she missed her dialysis. ED Course: Significant uremia with BUN 132, potassium 6.5, creatinine 17.3 glucose 37.  Patient was given hyperkalemia cocktail in the ED, IR and nephrology informed and subclavian access was placed by IR emergently and patient had HD and was admitted.  Patient had further work-up with CT head no acute finding, seen by nephrology underwent multiple dialysis sessions. S/p declotting of fistula by IR 1/14 and underwent HD during night.  Subjective: Overnight underwent dialysis. Is more alert,awake, told me her name " she is good now" son at bedside states but still confused- has baseline dementia.  Assessment & Plan:  Acute encephalopathy multifactorial-uremic 2/2 missed HD X2 and hypoglycemia.  CT head no acute finding, ammonia normal.  Likely from uremic encephalopathy. Son feels she is improved, has baseline dementia.   ESRD w. Clotted AVF, on HD TTS/metabolic acidosis/Metabolci bone disease hyperphosphetemia: Nephrology on board input appreciated, status post multiple hemodialysis, potassium and urea has normalized.  Noted plan for HD today also to get her back in schedule. Recent Labs  Lab 07/14/20 1210 07/14/20 2122  07/15/20 0427 07/16/20 0300 07/17/20 0332  BUN 132* 129* 37* 50* 20  CREATININE 17.31* 17.12* 7.86* 10.47* 4.56*   Multiple vascular access problems, clotted aVF. S/P declotting by IR 1/14.  [facility] from [facility]  IDDM-w/ hypoglycemia form insulin in the setting of missed HD- last hba1c stable 8.0 on 06/10/21.  No more hypoglycemia, blood sugar is stable.  On sliding scale insulin.  Monitor Recent Labs  Lab 07/16/20 0802 07/16/20 1153 07/16/20 1641 07/16/20 2140 07/17/20 0758  GLUCAP 163* 150* 115* 155* 143*   Hypertension-BP is stable continue iv prn meds.Noted hypotension during dialysis.  Monitor blood pressure.   Fluid overload from missed HD-continue dialysis as per nephrology see #1  Hyperkalemia 2/2 missed HD, potassium improved after multiple dialysis.  Recent Labs  Lab 07/16/20 0300 07/16/20 1000 07/16/20 1532 07/17/20 0300 07/17/20 0332  K 5.7* 5.7* 5.9* 6.1* 3.3*   GOC: Remains full code.  She does have baseline dementia as per the son.  Discussed w/ nephrology- wondering  whether it is helping this patient with ongoing dialysis-also requested palliative care consultation.   Nutrition: Diet Order            DIET DYS 3 Room service appropriate? No; Fluid consistency: Thin; Fluid restriction: 1200 mL Fluid  Diet effective now                  Body mass index is 28.27 kg/m.  Pressure Ulcer: Pressure Injury 07/15/20 Heel Left Unstageable - Full thickness tissue loss in which the base of the injury is covered by slough (yellow, tan, gray, green or brown) and/or eschar (tan, brown or black) in the wound bed. (Active)  07/15/20   Location: Heel  Location Orientation: Left  Staging: Unstageable - Full thickness tissue loss in which the base of the injury is covered by slough (yellow, tan, gray, green or brown) and/or eschar (tan, brown or black) in the wound bed.  Wound Description (Comments):   Present on Admission: Yes    DVT prophylaxis:  heparin injection 5,000 Units Start: 07/14/20 2200 Code Status:   Code Status: Full Code  Family Communication: plan of care discussed with patients son at the bedside  Status is: Inpatient Remains inpatient appropriate because:IV treatments appropriate due to intensity of illness or inability to take PO and Inpatient level of care appropriate due to severity of illness  Dispo: The patient is from: Home              Anticipated d/c is to: Home seen by PT OT.                Anticipated d/c date is:1- 2 days              Patient currently is not medically stable to d/c.  Consultants:see note  Procedures:see note  Culture/Microbiology    Component Value Date/Time   SDES BLOOD RIGHT HAND 02/13/2018 0404   SPECREQUEST  02/13/2018 0404    BOTTLES DRAWN AEROBIC AND ANAEROBIC Blood Culture adequate volume   CULT  02/13/2018 0404    NO GROWTH 5 DAYS Performed at [facility], [place] [address] [place], [place] [address]    REPTSTATUS 02/18/2018 FINAL 02/13/2018 0404    Other culture-see note  Medications: Scheduled Meds: . atorvastatin  40 mg Oral Daily  . Chlorhexidine Gluconate Cloth  6 each Topical Q0600  . cinacalcet  60 mg Oral Q breakfast  . collagenase   Topical Daily  . doxercalciferol  5 mcg Intravenous Q T,Th,Sa-HD  . DULoxetine  60 mg Oral Daily  . feeding supplement (NEPRO CARB STEADY)  237 mL Oral Q T,Th,Sa-HD  . heparin  5,000 Units Subcutaneous Q12H  . heparin sodium (porcine)      . insulin aspart  0-6 Units Subcutaneous TID WC  . loratadine  10 mg Oral Daily  . mupirocin ointment  1 application Topical BID  . pantoprazole  40 mg Oral Daily  . sucroferric oxyhydroxide  500 mg Oral BID WC   Continuous Infusions:  Antimicrobials: Anti-infectives (From admission, onward)   None     Objective: Vitals: Today's Vitals   07/17/20 0421 07/17/20 0436 07/17/20 0451 07/17/20 0615  BP: (!) 92/45 (!) 99/43 (!) 114/42 (!) 125/50  Pulse: 85 86 84 94  Resp: 12 15  16 14   Temp:    98.4 F (36.9 C)  TempSrc:    Oral  SpO2: 96% 100% 98% 96%  Weight:      PainSc:        Intake/Output Summary (Last 24 hours) at 07/17/2020 0802 Last data filed at 07/16/2020 1900 Gross per 24 hour  Intake 240 ml  Output --  Net 240 ml   Filed Weights   07/14/20 2110 07/15/20 0230 07/15/20 0427  Weight: 67 kg 63.5 kg 63.5 kg   Weight change:   Intake/Output from previous day: 01/14 0701 - 01/15 0700 In: 240 [P.O.:240] Out: -  Intake/Output this shift: No intake/output data recorded.  Examination: General exam: AAOx2 , NAD, weak appearing. HEENT:Oral mucosa moist, Ear/Nose WNL grossly, dentition normal. Respiratory system: bilaterally clear ,no wheezing or crackles,no use of accessory muscle Cardiovascular system: S1 & S2 +,  No JVD,. Gastrointestinal system: Abdomen soft, NT,ND, BS+ Nervous System:Alert, awake, moving extremities and grossly nonfocal Extremities: No edema, distal peripheral pulses palpable.  Skin: No rashes,no icterus. MSK: Normal muscle bulk,tone, power  Data Reviewed: I have personally reviewed following labs and imaging studies CBC: Recent Labs  Lab 07/14/20 1210 07/15/20 0332 07/16/20 0300 07/17/20 0332  WBC 7.0 5.3 6.3 5.3  HGB 11.7* 10.0* 10.8* 10.2*  HCT 39.0 31.9* 35.3* 33.3*  MCV 104.6* 100.6* 101.4* 102.1*  PLT 204 180 175 833*   Basic Metabolic Panel: Recent Labs  Lab 07/14/20 1210 07/14/20 1932 07/14/20 2122 07/15/20 0427 07/15/20 1120 07/16/20 0300 07/16/20 1000 07/16/20 1532 07/17/20 0300 07/17/20 0332  NA 143  --  142 137  --  137  --   --   --  135  K 6.5*   < > 6.5* 5.0   < > 5.7* 5.7* 5.9* 6.1* 3.3*  CL 102  --  100 97*  --  98  --   --   --  94*  CO2 20*  --  24 27  --  25  --   --   --  27  GLUCOSE 37*  --  114* 146*  --  172*  --   --   --  116*  BUN 132*  --  129* 37*  --  50*  --   --   --  20  CREATININE 17.31*  --  17.12* 7.86*  --  10.47*  --   --   --  4.56*  CALCIUM 9.8  --  9.4 9.0  --   9.4  --   --   --  8.5*  PHOS  --   --  6.8* 4.0  --   --   --   --   --   --    < > = values in this interval not displayed.   GFR: Estimated Creatinine Clearance: 9.2 mL/min (A) (by C-G formula based on SCr of 4.56 mg/dL (H)). Liver Function Tests: Recent Labs  Lab 07/14/20 2122 07/15/20 0427  ALBUMIN 2.7* 2.7*   No results for input(s): LIPASE, AMYLASE in the last 168 hours. Recent Labs  Lab 07/15/20 1120  AMMONIA 23   Coagulation Profile: Recent Labs  Lab 07/16/20 0300  INR 1.2   Cardiac Enzymes: No results for input(s): CKTOTAL, CKMB, CKMBINDEX, TROPONINI in the last 168 hours. BNP (last 3 results) No results for input(s): PROBNP in the last 8760 hours. HbA1C: No results for input(s): HGBA1C in the last 72 hours. CBG: Recent Labs  Lab 07/16/20 0802 07/16/20 1153 07/16/20 1641 07/16/20 2140 07/17/20 0758  GLUCAP 163* 150* 115* 155* 143*   Lipid Profile: No results for input(s): CHOL, HDL, LDLCALC, TRIG, CHOLHDL, LDLDIRECT in the last 72 hours. Thyroid Function Tests: No results for input(s): TSH, T4TOTAL, FREET4, T3FREE, THYROIDAB in the last 72 hours. Anemia Panel: No results for input(s): VITAMINB12, FOLATE, FERRITIN, TIBC, IRON, RETICCTPCT in the last 72 hours. Sepsis Labs: No results for input(s): PROCALCITON, LATICACIDVEN in the last 168 hours.  Recent Results (from the past 240 hour(s))  Resp panel by RT-PCR (RSV, Flu A&B, Covid) Nasopharyngeal Swab     Status: None   Collection Time: 07/14/20  2:06 PM   Specimen: Nasopharyngeal Swab; Nasopharyngeal(NP) swabs in vial transport medium  Result Value Ref Range Status   SARS Coronavirus 2 by RT PCR NEGATIVE NEGATIVE Final    Comment: (NOTE) SARS-CoV-2 target nucleic acids are NOT  DETECTED.  The SARS-CoV-2 RNA is generally detectable in upper respiratory specimens during the acute phase of infection. The lowest concentration of SARS-CoV-2 viral copies this assay can detect is 138 copies/mL. A  negative result does not preclude SARS-Cov-2 infection and should not be used as the sole basis for treatment or other patient management decisions. A negative result may occur with  improper specimen collection/handling, submission of specimen other than nasopharyngeal swab, presence of viral mutation(s) within the areas targeted by this assay, and inadequate number of viral copies(<138 copies/mL). A negative result must be combined with clinical observations, patient history, and epidemiological information. The expected result is Negative.  Fact Sheet for Patients:  EntrepreneurPulse.com.au  Fact Sheet for Healthcare Providers:  IncredibleEmployment.be  This test is no t yet approved or cleared by the [place] FDA and  has been authorized for detection and/or diagnosis of SARS-CoV-2 by FDA under an Emergency Use Authorization (EUA). This EUA will remain  in effect (meaning this test can be used) for the duration of the COVID-19 declaration under Section 564(b)(1) of the Act, 21 U.S.C.section 360bbb-3(b)(1), unless the authorization is terminated  or revoked sooner.       Influenza A by PCR NEGATIVE NEGATIVE Final   Influenza B by PCR NEGATIVE NEGATIVE Final    Comment: (NOTE) The Xpert Xpress SARS-CoV-2/FLU/RSV plus assay is intended as an aid in the diagnosis of influenza from Nasopharyngeal swab specimens and should not be used as a sole basis for treatment. Nasal washings and aspirates are unacceptable for Xpert Xpress SARS-CoV-2/FLU/RSV testing.  Fact Sheet for Patients: EntrepreneurPulse.com.au  Fact Sheet for Healthcare Providers: IncredibleEmployment.be  This test is not yet approved or cleared by the [place] FDA and has been authorized for detection and/or diagnosis of SARS-CoV-2 by FDA under an Emergency Use Authorization (EUA). This EUA will remain in effect (meaning this test can  be used) for the duration of the COVID-19 declaration under Section 564(b)(1) of the Act, 21 U.S.C. section 360bbb-3(b)(1), unless the authorization is terminated or revoked.     Resp Syncytial Virus by PCR NEGATIVE NEGATIVE Final    Comment: (NOTE) Fact Sheet for Patients: EntrepreneurPulse.com.au  Fact Sheet for Healthcare Providers: IncredibleEmployment.be  This test is not yet approved or cleared by the [place] FDA and has been authorized for detection and/or diagnosis of SARS-CoV-2 by FDA under an Emergency Use Authorization (EUA). This EUA will remain in effect (meaning this test can be used) for the duration of the COVID-19 declaration under Section 564(b)(1) of the Act, 21 U.S.C. section 360bbb-3(b)(1), unless the authorization is terminated or revoked.  Performed at [facility], [place] [address], [place], [place] [address]      Radiology Studies: IR US Guide Vasc Access Right  Result Date: 07/16/2020 INDICATION: Clotted AV fistula. 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 Note, patient also underwent placement of a temporary hemodialysis catheter 07/14/2020. EXAM: 1. FISTULALYSIS 2. ANGIOPLASTY OF VENOUS LIMB AND VENOUS ANASTOMOSIS 3. ULTRASOUND GUIDANCE FOR VENOUS ACCESS COMPARISON:  Right upper arm fistulagram with intervention-04/21/2020 Image guided placement of a temporary dialysis catheter-07/14/2020 MEDICATIONS: Heparin 400 units IV; TPA 4 mg into the fistula. CONTRAST:  25 cc Omnipaque 300 ANESTHESIA/SEDATION: Moderate (conscious) sedation was employed during this procedure. A total of Versed 1 mg and Fentanyl 50 mcg was administered intravenously. Moderate Sedation Time: 63 minutes. The patient's level of consciousness and vital signs were monitored continuously by radiology nursing  throughout the procedure under my direct  supervision. FLUOROSCOPY TIME:  9 minutes, 18 seconds (25 mGy) COMPLICATIONS: None immediate. TECHNIQUE: Informed written consent was obtained from the patient after a discussion of the risks, benefits and alternatives to treatment. Questions regarding the procedure were encouraged and answered. A timeout was performed prior to the initiation of the procedure. On physical examination, the existing right upper arm arm dialysis graft was negative for palpable pulse or thrill. The skin overlying the graft was prepped and draped in the usual sterile fashion, and a sterile drape was applied covering the operative field. Maximum barrier sterile technique with sterile gowns and gloves were used for the procedure. Under ultrasound guidance, the dialysis fistula was accessed directed towards the venous anastomosis with a micropuncture kit after the overlying soft tissues were anesthetized with 1% lidocaine. An ultrasound image was saved for documentation purposes. The micropuncture sheath was exchange for a 7-French vascular sheath over a guidewire. Over a Benson wire, a Kumpe catheter was advanced centrally and a central venogram was performed. Pull back venogram was performed with the Kumpe catheter. Heparin was administered systemically and TPA was administered via the Kumpe catheter throughout near the entirety of the venous limb. The majority of the venous limb was angioplastied initially with a 6 mm x 4 cm Conquest balloon and ultimately with a 7 mm x 4 cm Conquest balloon. An additional access was obtained directed towards the arterial anastomoses with a micropuncture kit after the overlying soft tissues anesthetized with 1% lidocaine. This allowed for placement of a 6-French vascular sheath. The graft was thrombectomized with several rounds of push-pull mechanical thrombectomy with an occlusion balloon. Flow was restored to the graft as evidenced by restored pulsatility of the graft and brisk blood return from the  side arm of the vascular sheath. Shuntograms were performed. Several additional rounds of post fall thrombectomy was performed with the use the occlusion balloon followed by additional angioplasty to 7 mm diameter at multiple stations along the access site. Completion fistulagram images were obtained and the procedure was terminated At this point, the procedure was terminated. All wires, catheters and sheaths were removed from the patient. Hemostasis was achieved at both access sites with deployment of a swizzle sutures which will be removed at the patient's next dialysis session. Dressings were applied. The patient tolerated the procedure without immediate postprocedural complication. FINDINGS: The existing left upper arm brachial-cephalic AV fistula thrombosed to the left of the proximal third of the humerus. The cephalic arch is noted to be patent. Near the entirety of the venous limb was ultimately angioplastied to 7 mm diameter. The fistula was successfully thrombectomized using mechanical and pharmacologic means as above. Initial post lysis fistulagram images demonstrate a minimal amount of adherent nonocclusive thrombus within the stick zone of the fistula. The adherent thrombus was then treated with several additional rounds of post pole thrombectomy with the occlusion device as well as prolonged angioplasty with a 7 mm diameter Conquest balloon. Completion fistulagram images demonstrate wide patency of the AV fistula through the level of the cephalic arch. Reflux fistulagram demonstrates wide patency of the arterial limb and anastomosis. The central venous system of the left upper extremity is widely patent to the level of the superior aspect of the right atrium. IMPRESSION: 1. Technically successful left upper arm brachiocephalic AV fistula thrombolysis. 2. Successful angioplasty of the venous limb to 7 mm diameter. Completion shuntogram demonstrates the venous limb is widely patent. 3. The arterial  anastomosis and arterial limb  are widely patent. 4. The central venous system is widely patent. ACCESS: - Recommend maintaining the non tunneled right jugular approach dialysis catheter until at least two sessions of successful dialysis are achieved via the upper arm fistula. - Assuming restored functionality and patency, the AV fistula would be amenable to future percutaneous intervention as clinically indicated. Electronically Signed   By: [name] M.D.   On: 07/16/2020 16:27   IR THROMBECTOMY AV FISTULA W/THROMBOLYSIS INC/SHUNT/IMG RIGHT  Result Date: 07/16/2020 INDICATION: Clotted AV fistula. 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 Note, patient also underwent placement of a temporary hemodialysis catheter 07/14/2020. EXAM: 1. FISTULALYSIS 2. ANGIOPLASTY OF VENOUS LIMB AND VENOUS ANASTOMOSIS 3. ULTRASOUND GUIDANCE FOR VENOUS ACCESS COMPARISON:  Right upper arm fistulagram with intervention-04/21/2020 Image guided placement of a temporary dialysis catheter-07/14/2020 MEDICATIONS: Heparin 400 units IV; TPA 4 mg into the fistula. CONTRAST:  25 cc Omnipaque 300 ANESTHESIA/SEDATION: Moderate (conscious) sedation was employed during this procedure. A total of Versed 1 mg and Fentanyl 50 mcg was administered intravenously. Moderate Sedation Time: 63 minutes. The patient's level of consciousness and vital signs were monitored continuously by radiology nursing throughout the procedure under my direct supervision. FLUOROSCOPY TIME:  9 minutes, 18 seconds (25 mGy) COMPLICATIONS: None immediate. TECHNIQUE: Informed written consent was obtained from the patient after a discussion of the risks, benefits and alternatives to treatment. Questions regarding the procedure were encouraged and answered. A timeout was performed prior to the initiation of the procedure. On physical examination, the existing right upper arm arm  dialysis graft was negative for palpable pulse or thrill. The skin overlying the graft was prepped and draped in the usual sterile fashion, and a sterile drape was applied covering the operative field. Maximum barrier sterile technique with sterile gowns and gloves were used for the procedure. Under ultrasound guidance, the dialysis fistula was accessed directed towards the venous anastomosis with a micropuncture kit after the overlying soft tissues were anesthetized with 1% lidocaine. An ultrasound image was saved for documentation purposes. The micropuncture sheath was exchange for a 7-French vascular sheath over a guidewire. Over a Benson wire, a Kumpe catheter was advanced centrally and a central venogram was performed. Pull back venogram was performed with the Kumpe catheter. Heparin was administered systemically and TPA was administered via the Kumpe catheter throughout near the entirety of the venous limb. The majority of the venous limb was angioplastied initially with a 6 mm x 4 cm Conquest balloon and ultimately with a 7 mm x 4 cm Conquest balloon. An additional access was obtained directed towards the arterial anastomoses with a micropuncture kit after the overlying soft tissues anesthetized with 1% lidocaine. This allowed for placement of a 6-French vascular sheath. The graft was thrombectomized with several rounds of push-pull mechanical thrombectomy with an occlusion balloon. Flow was restored to the graft as evidenced by restored pulsatility of the graft and brisk blood return from the side arm of the vascular sheath. Shuntograms were performed. Several additional rounds of post fall thrombectomy was performed with the use the occlusion balloon followed by additional angioplasty to 7 mm diameter at multiple stations along the access site. Completion fistulagram images were obtained and the procedure was terminated At this point, the procedure was terminated. All wires, catheters and sheaths were  removed from the patient. Hemostasis was achieved at both access sites with deployment of a swizzle sutures which will be removed at the patient's next dialysis  session. Dressings were applied. The patient tolerated the procedure without immediate postprocedural complication. FINDINGS: The existing left upper arm brachial-cephalic AV fistula thrombosed to the left of the proximal third of the humerus. The cephalic arch is noted to be patent. Near the entirety of the venous limb was ultimately angioplastied to 7 mm diameter. The fistula was successfully thrombectomized using mechanical and pharmacologic means as above. Initial post lysis fistulagram images demonstrate a minimal amount of adherent nonocclusive thrombus within the stick zone of the fistula. The adherent thrombus was then treated with several additional rounds of post pole thrombectomy with the occlusion device as well as prolonged angioplasty with a 7 mm diameter Conquest balloon. Completion fistulagram images demonstrate wide patency of the AV fistula through the level of the cephalic arch. Reflux fistulagram demonstrates wide patency of the arterial limb and anastomosis. The central venous system of the left upper extremity is widely patent to the level of the superior aspect of the right atrium. IMPRESSION: 1. Technically successful left upper arm brachiocephalic AV fistula thrombolysis. 2. Successful angioplasty of the venous limb to 7 mm diameter. Completion shuntogram demonstrates the venous limb is widely patent. 3. The arterial anastomosis and arterial limb are widely patent. 4. The central venous system is widely patent. ACCESS: - Recommend maintaining the non tunneled right jugular approach dialysis catheter until at least two sessions of successful dialysis are achieved via the upper arm fistula. - Assuming restored functionality and patency, the AV fistula would be amenable to future percutaneous intervention as clinically indicated.  Electronically Signed   By: [name] M.D.   On: 07/16/2020 16:27     LOS: 3 days   Antonieta Pert, MD [facility]  07/17/2020, 8:02 AM

## 2020-07-18 LAB — BASIC METABOLIC PANEL
Anion gap: 12 (ref 5–15)
BUN: 26 mg/dL — ABNORMAL HIGH (ref 8–23)
CO2: 28 mmol/L (ref 22–32)
Calcium: 8.5 mg/dL — ABNORMAL LOW (ref 8.9–10.3)
Chloride: 98 mmol/L (ref 98–111)
Creatinine, Ser: 7.1 mg/dL — ABNORMAL HIGH (ref 0.44–1.00)
GFR, Estimated: 6 mL/min — ABNORMAL LOW (ref >60)
Glucose, Bld: 146 mg/dL — ABNORMAL HIGH (ref 70–99)
Potassium: 4 mmol/L (ref 3.5–5.1)
Sodium: 138 mmol/L (ref 135–145)

## 2020-07-18 LAB — CBC
HCT: 29.2 % — ABNORMAL LOW (ref 36.0–46.0)
Hemoglobin: 9.3 g/dL — ABNORMAL LOW (ref 12.0–15.0)
MCH: 31.7 pg (ref 26.0–34.0)
MCHC: 31.8 g/dL (ref 30.0–36.0)
MCV: 99.7 fL (ref 80.0–100.0)
Platelets: 145 10*3/uL — ABNORMAL LOW (ref 150–400)
RBC: 2.93 MIL/uL — ABNORMAL LOW (ref 3.87–5.11)
RDW: 13.9 % (ref 11.5–15.5)
WBC: 4.3 10*3/uL (ref 4.0–10.5)
nRBC: 0 % (ref 0.0–0.2)

## 2020-07-18 LAB — GLUCOSE, CAPILLARY
Glucose-Capillary: 138 mg/dL — ABNORMAL HIGH (ref 70–99)
Glucose-Capillary: 128 mg/dL — ABNORMAL HIGH (ref 70–99)

## 2020-07-18 MED ORDER — INSULIN GLARGINE 100 UNIT/ML ~~LOC~~ SOLN: 5.0000 [IU] | Freq: Every day | SUBCUTANEOUS | 0 refills | Status: DC

## 2020-07-18 NOTE — Discharge Summary (Signed)
Physician Discharge Summary  Sarah Kelley OZH:[id number] DOB: 04-17-1951 DOA: 07/14/2020  PCP: [name], MD  Admit date: 07/14/2020 Discharge date: 07/18/2020  Admitted From: HOME Disposition:  HOM  Recommendations for Outpatient Follow-up:  1. Follow up with PCP in 1-2 weeks 2. Please obtain BMP/CBC in one week 3. Please follow up on the following pending results:  Home Health:NO  Equipment/Devices: NONE  Discharge Condition: Stable Code Status:   Code Status: Full Code Diet recommendation:  Diet Order            Diet - low sodium heart healthy           DIET DYS 3 Room service appropriate? No; Fluid consistency: Thin; Fluid restriction: 1200 mL Fluid  Diet effective now               Brief/Interim Summary: 70yoF w/ ESRD on HD tts, chronic seizure like activity, HTN, IDDM, NEUROPATHY, rt eye blind, presented with increasing lethargic, feeling nausea glucose after missing several dialysis x2 on 07/14/20. As per report- right arm AV fistula clotted-missed 2 sessions of HD and 07/13/20. She last received insulin  1/11 am and in the evening family checked her blood sugar,which was less than 100 and did not give the evening dose of insulin.Family also noticed increasing twitching like movement of her shoulders and arms which happened in the past when she missed her dialysis. ED Course:Significant uremia with BUN 132, potassium 6.5, creatinine 17.3 glucose 37. Patient was given hyperkalemia cocktail in the ED, IR and nephrology informed and subclavian access was placed by IR emergently and patient had HD and was admitted.  Patient had further work-up with CT head no acute finding, seen by nephrology underwent multiple dialysis sessions. S/p declotting of fistula by IR 1/14 and underwent HD during night. After multiple dialysis and potassium normalized fluid overload resolved.  Uremia resolved.  She is alert awake oriented with baseline dementia.  Son at the bedside.   Would like to go home today.  Cleared by nephrology this morning and she will have her temporary dialysis catheter removed before discharge.  Discharge Diagnoses:  Acute encephalopathy multifactorial-uremic 2/2 missed HD X2 and hypoglycemia.  CT head no acute finding,. Likely from uremic encephalopathy now improved, stable with baseline dementia.  Wants to go home today   ESRD w. Clotted AVF, on HD TTS/metabolic acidosis/Metabolci bone disease hyperphosphetemia: Nephrology on board input appreciated, status post multiple hemodialysis.  At this time potassium is stabilized uremia resolved.  Okay to discharge home with plan for outpatient dialysis as per the schedule TTS Temporary catheter will be removed prior to discharge.  Multiple vascular access problems, clotted aVF. S/P declotting by IR 1/14.  Asterixis from [facility]  IDDM-w/ hypoglycemia form insulin in the setting of missed HD- last hba1c stable 8.0 on 06/10/21.  Cut down in the Lantus to 5 units and hold if hypoglycemia follow-up with PCP to adjust modify insulin further.  Hypertension-BP is uptrending resume lisinopril on discharge.    Fluid overload from missed HD-resolved.  Hyperkalemia 2/2 missed HD, resolved Recent Labs  Lab 07/17/20 0332 07/17/20 0818 07/17/20 1223 07/17/20 1912 07/18/20 0705  K 3.3* 3.6 3.8 4.0 4.0   Pressure Ulcer: Pressure Injury 07/15/20 Heel Left Unstageable - Full thickness tissue loss in which the base of the injury is covered by slough (yellow, tan, gray, green or brown) and/or eschar (tan, brown or black) in the wound bed. (Active)  07/15/20   Location: Heel  Location Orientation: Left  Staging:  Unstageable - Full thickness tissue loss in which the base of the injury is covered by slough (yellow, tan, gray, green or brown) and/or eschar (tan, brown or black) in the wound bed.  Wound Description (Comments):   Present on Admission: Yes   Consults:  nephro  Subjective: Alert  awake stable at baseline with dementia, son at the bedside he feels patient has returned back to baseline and would like to go home today.  Discharge Exam: Vitals:   07/18/20 0812 07/18/20 1157  BP: (!) 162/56 (!) 148/54  Pulse: 76 72  Resp: 14 16  Temp: 99.3 F (37.4 C) 98.2 F (36.8 C)  SpO2: 95% 97%   General: Pt is alert, awake, not in acute distress Cardiovascular: RRR, S1/S2 +, no rubs, no gallops Respiratory: CTA bilaterally, no wheezing, no rhonchi Abdominal: Soft, NT, ND, bowel sounds + Extremities: no edema, no cyanosis  Discharge Instructions  Discharge Instructions    Diet - low sodium heart healthy   Complete by: As directed    Discharge instructions   Complete by: As directed    Please call call MD or return to ER for similar or worsening recurring problem that brought you to hospital or if any fever,nausea/vomiting,abdominal pain, uncontrolled pain, chest pain,  shortness of breath or any other alarming symptoms.  Please follow-up your doctor as instructed in a week time and call the office for appointment.  Please avoid alcohol, smoking, or any other illicit substance and maintain healthy habits including taking your regular medications as prescribed.  You were cared for by a hospitalist during your hospital stay. If you have any questions about your discharge medications or the care you received while you were in the hospital after you are discharged, you can call the unit and ask to speak with the hospitalist on call if the hospitalist that took care of you is not available.  Once you are discharged, your primary care physician will handle any further medical issues. Please note that NO REFILLS for any discharge medications will be authorized once you are discharged, as it is imperative that you return to your primary care physician (or establish a relationship with a primary care physician if you do not have one) for your aftercare needs so that they can reassess  your need for medications and monitor your lab values    Check blood sugar 3 times a day and bedtime at home. If blood sugar running above 200 less than 70 please call your MD to adjust insulin. If blood sugars running less 100 do not use insulin and call MD. If you noticed signs and symptoms of hypoglycemia or low blood sugar like jitteriness, confusion, thirst, tremor, sweating- Check blood sugar, drink sugary drink/biscuits/sweets to increase sugar level and call MD or return to ER.   Discharge wound care:   Complete by: As directed    Apply Santyl to left heel wound Q day, then cover with moist gauze and kerlex   Increase activity slowly   Complete by: As directed      Allergies as of 07/18/2020   No Known Allergies     Medication List    STOP taking these medications   oxyCODONE-acetaminophen 5-325 MG tablet Commonly known as: Percocet   polyethylene glycol powder 17 GM/SCOOP powder Commonly known as: GLYCOLAX/MIRALAX     TAKE these medications   atorvastatin 40 MG tablet Commonly known as: LIPITOR Take 1 tablet (40 mg total) by mouth daily.   cetirizine 10 MG tablet  Commonly known as: ZYRTEC Take 1 tablet (10 mg total) by mouth daily.   cinacalcet 60 MG tablet Commonly known as: SENSIPAR Take 60 mg by mouth daily.   DIALYVITE 800 WITH ZINC 0.8 MG Tabs Take 1 tablet by mouth daily.   doxercalciferol 0.5 MCG capsule Commonly known as: HECTOROL Take 0.5 mcg by mouth 3 (three) times a week.   DULoxetine 60 MG capsule Commonly known as: CYMBALTA Take 1 capsule (60 mg total) by mouth daily.   ethyl chloride spray SPRAY 1 SPRAY TO THE SKIN 3 TIMES A WEEK AS NEEDED. SPRAY A SMALL AMOUNT JUST PRIOR TO NEEDLE INSERTION. What changed: See the new instructions.   feeding supplement (NEPRO CARB STEADY) Liqd Take 237 mLs by mouth Every Tuesday,Thursday,and Saturday with dialysis.   feeding supplement (PRO-STAT SUGAR FREE 64) Liqd Take 30 mLs by mouth 2 (two) times  daily.   gabapentin 300 MG capsule Commonly known as: NEURONTIN Take 1 capsule (300 mg total) by mouth at bedtime.   insulin glargine 100 UNIT/ML injection Commonly known as: Lantus Inject 0.05 mLs (5 Units total) into the skin daily. Hold if sugar < 120 What changed:   how much to take  how to take this  when to take this  additional instructions   Insulin Syringe-Needle U-100 31G X 5/16" 1 ML Misc Commonly known as: TRUEplus Insulin Syringe USE AS DIRECTED   lisinopril 20 MG tablet Commonly known as: ZESTRIL TAKE 1 TABLET (20 MG TOTAL) BY MOUTH DAILY.   MIRCERA IJ Mircera   mupirocin ointment 2 % Commonly known as: BACTROBAN Apply 1 application topically 2 (two) times daily.   omeprazole 20 MG capsule Commonly known as: PRILOSEC Take 1 capsule (20 mg total) by mouth daily.   True Metrix Blood Glucose Test test strip Generic drug: glucose blood Use 3 times daily before meals   True Metrix Meter Devi 1 each by Does not apply route 3 (three) times daily before meals.   TRUEplus Lancets 28G Misc USE AS DIRECTED THREE TIMES DAILY TO TEST SUGAR BEFORE MEALS What changed: See the new instructions.   Velphoro 500 MG chewable tablet Generic drug: sucroferric oxyhydroxide Chew 500 mg by mouth 2 (two) times daily with a meal.            Discharge Care Instructions  (From admission, onward)         Start     Ordered   07/18/20 0000  Discharge wound care:       Comments: Apply Santyl to left heel wound Q day, then cover with moist gauze and kerlex   07/18/20 1027          Follow-up Information    [name], MD Follow up in 1 week(s).   Specialty: Family Medicine Contact information: [address] [place] [phone]              No Known Allergies  The results of significant diagnostics from this hospitalization (including imaging, microbiology, ancillary and laboratory) are listed below for reference.     Microbiology: Recent Results (from the past 240 hour(s))  Resp panel by RT-PCR (RSV, Flu A&B, Covid) Nasopharyngeal Swab     Status: None   Collection Time: 07/14/20  2:06 PM   Specimen: Nasopharyngeal Swab; Nasopharyngeal(NP) swabs in vial transport medium  Result Value Ref Range Status   SARS Coronavirus 2 by RT PCR NEGATIVE NEGATIVE Final    Comment: (NOTE) SARS-CoV-2 target nucleic acids are NOT DETECTED.  The SARS-CoV-2 RNA is generally detectable in upper respiratory specimens during the acute phase of infection. The lowest concentration of SARS-CoV-2 viral copies this assay can detect is 138 copies/mL. A negative result does not preclude SARS-Cov-2 infection and should not be used as the sole basis for treatment or other patient management decisions. A negative result may occur with  improper specimen collection/handling, submission of specimen other than nasopharyngeal swab, presence of viral mutation(s) within the areas targeted by this assay, and inadequate number of viral copies(<138 copies/mL). A negative result must be combined with clinical observations, patient history, and epidemiological information. The expected result is Negative.  Fact Sheet for Patients:  EntrepreneurPulse.com.au  Fact Sheet for Healthcare Providers:  IncredibleEmployment.be  This test is no t yet approved or cleared by the [place] FDA and  has been authorized for detection and/or diagnosis of SARS-CoV-2 by FDA under an Emergency Use Authorization (EUA). This EUA will remain  in effect (meaning this test can be used) for the duration of the COVID-19 declaration under Section 564(b)(1) of the Act, 21 U.S.C.section 360bbb-3(b)(1), unless the authorization is terminated  or revoked sooner.       Influenza A by PCR NEGATIVE NEGATIVE Final   Influenza B by PCR NEGATIVE NEGATIVE Final    Comment: (NOTE) The Xpert Xpress SARS-CoV-2/FLU/RSV plus assay  is intended as an aid in the diagnosis of influenza from Nasopharyngeal swab specimens and should not be used as a sole basis for treatment. Nasal washings and aspirates are unacceptable for Xpert Xpress SARS-CoV-2/FLU/RSV testing.  Fact Sheet for Patients: EntrepreneurPulse.com.au  Fact Sheet for Healthcare Providers: IncredibleEmployment.be  This test is not yet approved or cleared by the [place] FDA and has been authorized for detection and/or diagnosis of SARS-CoV-2 by FDA under an Emergency Use Authorization (EUA). This EUA will remain in effect (meaning this test can be used) for the duration of the COVID-19 declaration under Section 564(b)(1) of the Act, 21 U.S.C. section 360bbb-3(b)(1), unless the authorization is terminated or revoked.     Resp Syncytial Virus by PCR NEGATIVE NEGATIVE Final    Comment: (NOTE) Fact Sheet for Patients: EntrepreneurPulse.com.au  Fact Sheet for Healthcare Providers: IncredibleEmployment.be  This test is not yet approved or cleared by the [place] FDA and has been authorized for detection and/or diagnosis of SARS-CoV-2 by FDA under an Emergency Use Authorization (EUA). This EUA will remain in effect (meaning this test can be used) for the duration of the COVID-19 declaration under Section 564(b)(1) of the Act, 21 U.S.C. section 360bbb-3(b)(1), unless the authorization is terminated or revoked.  Performed at [facility], [place] [address] [place], [place] [address]     Procedures/Studies: CT HEAD WO CONTRAST  Result Date: 07/15/2020 CLINICAL DATA:  70 year old female with uremic encephalopathy. Failed dialysis fistula, missed hemodialysis. EXAM: CT HEAD WITHOUT CONTRAST TECHNIQUE: Contiguous axial images were obtained from the base of the skull through the vertex without intravenous contrast. COMPARISON:  Brain MRI 04/17/2017.  Head CT 02/14/2018.  FINDINGS: Brain: Stable cerebral volume since 2019. Post ischemic encephalomalacia in the right external capsule, right cerebellum right parietal lobe. Additional chronic small vessel disease changes in the bilateral basal ganglia and pons appear progressed since 2019. Patchy additional bilateral white matter hypodensity has also somewhat increased. No superimposed midline shift, ventriculomegaly, mass effect, evidence of mass lesion, intracranial hemorrhage or evidence of cortically based acute infarction. No new areas of cortical encephalomalacia. Vascular: Extensive Calcified atherosclerosis at the skull base. No suspicious intracranial  vascular hyperdensity. Skull: No acute osseous abnormality identified. Sinuses/Orbits: Chronic sphenoid sinus disease has improved since 2019. Other Visualized paranasal sinuses and mastoids are stable and well pneumatized. Other: Stable orbit and scalp soft tissues. Postoperative changes to both globes. IMPRESSION: 1. No acute intracranial abnormality identified. 2. Advanced chronic ischemic disease appears progressed since 2019 in the bilateral deep gray nuclei and pons. Electronically Signed   By: Genevie Ann M.D.   On: 07/15/2020 07:00   IR Fluoro Guide CV Line Right  Result Date: 07/14/2020 INDICATION: 70 year old female with end-stage renal disease. EXAM: NON-TUNNELED CENTRAL VENOUS HEMODIALYSIS CATHETER PLACEMENT WITH ULTRASOUND AND FLUOROSCOPIC GUIDANCE COMPARISON:  None. MEDICATIONS: None FLUOROSCOPY TIME:  0.1 minutes, (0 mGy) COMPLICATIONS: None immediate. PROCEDURE: Informed written consent was obtained from the patient after a discussion of the risks, benefits, and alternatives to treatment. Questions regarding the procedure were encouraged and answered. The right neck and chest were prepped with chlorhexidine in a sterile fashion, and a sterile drape was applied covering the operative field. Maximum barrier sterile technique with sterile gowns and gloves were used  for the procedure. A timeout was performed prior to the initiation of the procedure. After the overlying soft tissues were anesthetized, a small venotomy incision was created and a micropuncture kit was utilized to access the external jugular vein. Real-time ultrasound guidance was utilized for vascular access including the acquisition of a permanent ultrasound image documenting patency of the accessed vessel. The microwire was utilized to measure appropriate catheter length. A stiff glidewire was advanced to the level of the IVC. Under fluoroscopic guidance, the venotomy was serially dilated, ultimately allowing placement of a 16 cm temporary Trialysis catheter with tip ultimately terminating within the superior aspect of the right atrium. Final catheter positioning was confirmed and documented with a spot radiographic image. The catheter aspirates and flushes normally. The catheter was flushed with appropriate volume heparin dwells. The catheter exit site was secured with a 0-Prolene retention suture. A dressing was placed. The patient tolerated the procedure well without immediate post procedural complication. IMPRESSION: Successful placement of a right external jugular approach 16 cm temporary dialysis catheter with tip terminating with in the superior aspect of the right atrium. The catheter is ready for immediate use. PLAN: This catheter may be converted to a tunneled dialysis catheter at a later date as indicated. Ruthann Cancer, MD [facility] Electronically Signed   By: Ruthann Cancer MD   On: 07/14/2020 17:49   IR US Guide Vasc Access Right  Result Date: 07/16/2020 INDICATION: Clotted AV fistula. 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 Note, patient also underwent placement of a temporary hemodialysis catheter 07/14/2020. EXAM: 1. FISTULALYSIS 2.  ANGIOPLASTY OF VENOUS LIMB AND VENOUS ANASTOMOSIS 3. ULTRASOUND GUIDANCE FOR VENOUS ACCESS COMPARISON:  Right upper arm fistulagram with intervention-04/21/2020 Image guided placement of a temporary dialysis catheter-07/14/2020 MEDICATIONS: Heparin 400 units IV; TPA 4 mg into the fistula. CONTRAST:  25 cc Omnipaque 300 ANESTHESIA/SEDATION: Moderate (conscious) sedation was employed during this procedure. A total of Versed 1 mg and Fentanyl 50 mcg was administered intravenously. Moderate Sedation Time: 63 minutes. The patient's level of consciousness and vital signs were monitored continuously by radiology nursing throughout the procedure under my direct supervision. FLUOROSCOPY TIME:  9 minutes, 18 seconds (25 mGy) COMPLICATIONS: None immediate. TECHNIQUE: Informed written consent was obtained from the patient after a discussion of the risks, benefits and alternatives to treatment.  Questions regarding the procedure were encouraged and answered. A timeout was performed prior to the initiation of the procedure. On physical examination, the existing right upper arm arm dialysis graft was negative for palpable pulse or thrill. The skin overlying the graft was prepped and draped in the usual sterile fashion, and a sterile drape was applied covering the operative field. Maximum barrier sterile technique with sterile gowns and gloves were used for the procedure. Under ultrasound guidance, the dialysis fistula was accessed directed towards the venous anastomosis with a micropuncture kit after the overlying soft tissues were anesthetized with 1% lidocaine. An ultrasound image was saved for documentation purposes. The micropuncture sheath was exchange for a 7-French vascular sheath over a guidewire. Over a Benson wire, a Kumpe catheter was advanced centrally and a central venogram was performed. Pull back venogram was performed with the Kumpe catheter. Heparin was administered systemically and TPA was administered via the  Kumpe catheter throughout near the entirety of the venous limb. The majority of the venous limb was angioplastied initially with a 6 mm x 4 cm Conquest balloon and ultimately with a 7 mm x 4 cm Conquest balloon. An additional access was obtained directed towards the arterial anastomoses with a micropuncture kit after the overlying soft tissues anesthetized with 1% lidocaine. This allowed for placement of a 6-French vascular sheath. The graft was thrombectomized with several rounds of push-pull mechanical thrombectomy with an occlusion balloon. Flow was restored to the graft as evidenced by restored pulsatility of the graft and brisk blood return from the side arm of the vascular sheath. Shuntograms were performed. Several additional rounds of post fall thrombectomy was performed with the use the occlusion balloon followed by additional angioplasty to 7 mm diameter at multiple stations along the access site. Completion fistulagram images were obtained and the procedure was terminated At this point, the procedure was terminated. All wires, catheters and sheaths were removed from the patient. Hemostasis was achieved at both access sites with deployment of a swizzle sutures which will be removed at the patient's next dialysis session. Dressings were applied. The patient tolerated the procedure without immediate postprocedural complication. FINDINGS: The existing left upper arm brachial-cephalic AV fistula thrombosed to the left of the proximal third of the humerus. The cephalic arch is noted to be patent. Near the entirety of the venous limb was ultimately angioplastied to 7 mm diameter. The fistula was successfully thrombectomized using mechanical and pharmacologic means as above. Initial post lysis fistulagram images demonstrate a minimal amount of adherent nonocclusive thrombus within the stick zone of the fistula. The adherent thrombus was then treated with several additional rounds of post pole thrombectomy with  the occlusion device as well as prolonged angioplasty with a 7 mm diameter Conquest balloon. Completion fistulagram images demonstrate wide patency of the AV fistula through the level of the cephalic arch. Reflux fistulagram demonstrates wide patency of the arterial limb and anastomosis. The central venous system of the left upper extremity is widely patent to the level of the superior aspect of the right atrium. IMPRESSION: 1. Technically successful left upper arm brachiocephalic AV fistula thrombolysis. 2. Successful angioplasty of the venous limb to 7 mm diameter. Completion shuntogram demonstrates the venous limb is widely patent. 3. The arterial anastomosis and arterial limb are widely patent. 4. The central venous system is widely patent. ACCESS: - Recommend maintaining the non tunneled right jugular approach dialysis catheter until at least two sessions of successful dialysis are achieved via the upper arm fistula. - Assuming  restored functionality and patency, the AV fistula would be amenable to future percutaneous intervention as clinically indicated. Electronically Signed   By: [name] M.D.   On: 07/16/2020 16:27   IR US Guide Vasc Access Right  Result Date: 07/14/2020 INDICATION: 70 year old female with end-stage renal disease. EXAM: NON-TUNNELED CENTRAL VENOUS HEMODIALYSIS CATHETER PLACEMENT WITH ULTRASOUND AND FLUOROSCOPIC GUIDANCE COMPARISON:  None. MEDICATIONS: None FLUOROSCOPY TIME:  0.1 minutes, (0 mGy) COMPLICATIONS: None immediate. PROCEDURE: Informed written consent was obtained from the patient after a discussion of the risks, benefits, and alternatives to treatment. Questions regarding the procedure were encouraged and answered. The right neck and chest were prepped with chlorhexidine in a sterile fashion, and a sterile drape was applied covering the operative field. Maximum barrier sterile technique with sterile gowns and gloves were used for the procedure. A timeout was performed  prior to the initiation of the procedure. After the overlying soft tissues were anesthetized, a small venotomy incision was created and a micropuncture kit was utilized to access the external jugular vein. Real-time ultrasound guidance was utilized for vascular access including the acquisition of a permanent ultrasound image documenting patency of the accessed vessel. The microwire was utilized to measure appropriate catheter length. A stiff glidewire was advanced to the level of the IVC. Under fluoroscopic guidance, the venotomy was serially dilated, ultimately allowing placement of a 16 cm temporary Trialysis catheter with tip ultimately terminating within the superior aspect of the right atrium. Final catheter positioning was confirmed and documented with a spot radiographic image. The catheter aspirates and flushes normally. The catheter was flushed with appropriate volume heparin dwells. The catheter exit site was secured with a 0-Prolene retention suture. A dressing was placed. The patient tolerated the procedure well without immediate post procedural complication. IMPRESSION: Successful placement of a right external jugular approach 16 cm temporary dialysis catheter with tip terminating with in the superior aspect of the right atrium. The catheter is ready for immediate use. PLAN: This catheter may be converted to a tunneled dialysis catheter at a later date as indicated. Ruthann Cancer, MD [facility] Radiology Electronically Signed   By: Ruthann Cancer MD   On: 07/14/2020 17:49   IR THROMBECTOMY AV FISTULA W/THROMBOLYSIS INC/SHUNT/IMG RIGHT  Result Date: 07/16/2020 INDICATION: Clotted AV fistula. 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 Note, patient also underwent placement of a temporary hemodialysis catheter 07/14/2020. EXAM: 1. FISTULALYSIS 2. ANGIOPLASTY OF  VENOUS LIMB AND VENOUS ANASTOMOSIS 3. ULTRASOUND GUIDANCE FOR VENOUS ACCESS COMPARISON:  Right upper arm fistulagram with intervention-04/21/2020 Image guided placement of a temporary dialysis catheter-07/14/2020 MEDICATIONS: Heparin 400 units IV; TPA 4 mg into the fistula. CONTRAST:  25 cc Omnipaque 300 ANESTHESIA/SEDATION: Moderate (conscious) sedation was employed during this procedure. A total of Versed 1 mg and Fentanyl 50 mcg was administered intravenously. Moderate Sedation Time: 63 minutes. The patient's level of consciousness and vital signs were monitored continuously by radiology nursing throughout the procedure under my direct supervision. FLUOROSCOPY TIME:  9 minutes, 18 seconds (25 mGy) COMPLICATIONS: None immediate. TECHNIQUE: Informed written consent was obtained from the patient after a discussion of the risks, benefits and alternatives to treatment. Questions regarding the procedure were encouraged and answered. A timeout was performed prior to the initiation of the procedure. On physical examination, the existing right upper arm arm dialysis graft was negative for palpable pulse or thrill. The skin overlying the graft was prepped and draped  in the usual sterile fashion, and a sterile drape was applied covering the operative field. Maximum barrier sterile technique with sterile gowns and gloves were used for the procedure. Under ultrasound guidance, the dialysis fistula was accessed directed towards the venous anastomosis with a micropuncture kit after the overlying soft tissues were anesthetized with 1% lidocaine. An ultrasound image was saved for documentation purposes. The micropuncture sheath was exchange for a 7-French vascular sheath over a guidewire. Over a Benson wire, a Kumpe catheter was advanced centrally and a central venogram was performed. Pull back venogram was performed with the Kumpe catheter. Heparin was administered systemically and TPA was administered via the Kumpe catheter  throughout near the entirety of the venous limb. The majority of the venous limb was angioplastied initially with a 6 mm x 4 cm Conquest balloon and ultimately with a 7 mm x 4 cm Conquest balloon. An additional access was obtained directed towards the arterial anastomoses with a micropuncture kit after the overlying soft tissues anesthetized with 1% lidocaine. This allowed for placement of a 6-French vascular sheath. The graft was thrombectomized with several rounds of push-pull mechanical thrombectomy with an occlusion balloon. Flow was restored to the graft as evidenced by restored pulsatility of the graft and brisk blood return from the side arm of the vascular sheath. Shuntograms were performed. Several additional rounds of post fall thrombectomy was performed with the use the occlusion balloon followed by additional angioplasty to 7 mm diameter at multiple stations along the access site. Completion fistulagram images were obtained and the procedure was terminated At this point, the procedure was terminated. All wires, catheters and sheaths were removed from the patient. Hemostasis was achieved at both access sites with deployment of a swizzle sutures which will be removed at the patient's next dialysis session. Dressings were applied. The patient tolerated the procedure without immediate postprocedural complication. FINDINGS: The existing left upper arm brachial-cephalic AV fistula thrombosed to the left of the proximal third of the humerus. The cephalic arch is noted to be patent. Near the entirety of the venous limb was ultimately angioplastied to 7 mm diameter. The fistula was successfully thrombectomized using mechanical and pharmacologic means as above. Initial post lysis fistulagram images demonstrate a minimal amount of adherent nonocclusive thrombus within the stick zone of the fistula. The adherent thrombus was then treated with several additional rounds of post pole thrombectomy with the occlusion  device as well as prolonged angioplasty with a 7 mm diameter Conquest balloon. Completion fistulagram images demonstrate wide patency of the AV fistula through the level of the cephalic arch. Reflux fistulagram demonstrates wide patency of the arterial limb and anastomosis. The central venous system of the left upper extremity is widely patent to the level of the superior aspect of the right atrium. IMPRESSION: 1. Technically successful left upper arm brachiocephalic AV fistula thrombolysis. 2. Successful angioplasty of the venous limb to 7 mm diameter. Completion shuntogram demonstrates the venous limb is widely patent. 3. The arterial anastomosis and arterial limb are widely patent. 4. The central venous system is widely patent. ACCESS: - Recommend maintaining the non tunneled right jugular approach dialysis catheter until at least two sessions of successful dialysis are achieved via the upper arm fistula. - Assuming restored functionality and patency, the AV fistula would be amenable to future percutaneous intervention as clinically indicated. Electronically Signed   By: [name] M.D.   On: 07/16/2020 16:27    Labs: BNP (last 3 results) No results for input(s): BNP in the  last 8760 hours. Basic Metabolic Panel: Recent Labs  Lab 07/14/20 2122 07/15/20 0427 07/15/20 1120 07/16/20 0300 07/16/20 1000 07/17/20 0332 07/17/20 0818 07/17/20 1223 07/17/20 1912 07/18/20 0705  NA 142 137  --  137  --  135  --   --   --  138  K 6.5* 5.0   < > 5.7*   < > 3.3* 3.6 3.8 4.0 4.0  CL 100 97*  --  98  --  94*  --   --   --  98  CO2 24 27  --  25  --  27  --   --   --  28  GLUCOSE 114* 146*  --  172*  --  116*  --   --   --  146*  BUN 129* 37*  --  50*  --  20  --   --   --  26*  CREATININE 17.12* 7.86*  --  10.47*  --  4.56*  --   --   --  7.10*  CALCIUM 9.4 9.0  --  9.4  --  8.5*  --   --   --  8.5*  PHOS 6.8* 4.0  --   --   --   --   --   --   --   --    < > = values in this interval not displayed.    Liver Function Tests: Recent Labs  Lab 07/14/20 2122 07/15/20 0427  ALBUMIN 2.7* 2.7*   No results for input(s): LIPASE, AMYLASE in the last 168 hours. Recent Labs  Lab 07/15/20 1120  AMMONIA 23   CBC: Recent Labs  Lab 07/14/20 1210 07/15/20 0332 07/16/20 0300 07/17/20 0332 07/18/20 0705  WBC 7.0 5.3 6.3 5.3 4.3  HGB 11.7* 10.0* 10.8* 10.2* 9.3*  HCT 39.0 31.9* 35.3* 33.3* 29.2*  MCV 104.6* 100.6* 101.4* 102.1* 99.7  PLT 204 180 175 145* 145*   Cardiac Enzymes: No results for input(s): CKTOTAL, CKMB, CKMBINDEX, TROPONINI in the last 168 hours. BNP: Invalid input(s): POCBNP CBG: Recent Labs  Lab 07/17/20 1203 07/17/20 1714 07/17/20 2129 07/18/20 0810 07/18/20 1155  GLUCAP 172* 113* 183* 138* 128*   D-Dimer No results for input(s): DDIMER in the last 72 hours. Hgb A1c No results for input(s): HGBA1C in the last 72 hours. Lipid Profile No results for input(s): CHOL, HDL, LDLCALC, TRIG, CHOLHDL, LDLDIRECT in the last 72 hours. Thyroid function studies No results for input(s): TSH, T4TOTAL, T3FREE, THYROIDAB in the last 72 hours.  Invalid input(s): FREET3 Anemia work up No results for input(s): VITAMINB12, FOLATE, FERRITIN, TIBC, IRON, RETICCTPCT in the last 72 hours. Urinalysis    Component Value Date/Time   COLORURINE YELLOW 02/10/2018 2342   APPEARANCEUR CLOUDY (A) 02/10/2018 2342   LABSPEC 1.012 02/10/2018 2342   PHURINE 8.0 02/10/2018 2342   GLUCOSEU >=500 (A) 02/10/2018 2342   HGBUR MODERATE (A) 02/10/2018 2342   BILIRUBINUR NEGATIVE 02/10/2018 2342   BILIRUBINUR small 03/27/2017 1051   KETONESUR NEGATIVE 02/10/2018 2342   PROTEINUR 100 (A) 02/10/2018 2342   UROBILINOGEN 0.2 03/27/2017 1051   UROBILINOGEN 0.2 12/31/2008 1556   NITRITE NEGATIVE 02/10/2018 2342   LEUKOCYTESUR LARGE (A) 02/10/2018 2342   Sepsis Labs Invalid input(s): PROCALCITONIN,  WBC,  LACTICIDVEN Microbiology Recent Results (from the past 240 hour(s))  Resp panel by  RT-PCR (RSV, Flu A&B, Covid) Nasopharyngeal Swab     Status: None   Collection Time: 07/14/20  2:06 PM   Specimen:  Nasopharyngeal Swab; Nasopharyngeal(NP) swabs in vial transport medium  Result Value Ref Range Status   SARS Coronavirus 2 by RT PCR NEGATIVE NEGATIVE Final    Comment: (NOTE) SARS-CoV-2 target nucleic acids are NOT DETECTED.  The SARS-CoV-2 RNA is generally detectable in upper respiratory specimens during the acute phase of infection. The lowest concentration of SARS-CoV-2 viral copies this assay can detect is 138 copies/mL. A negative result does not preclude SARS-Cov-2 infection and should not be used as the sole basis for treatment or other patient management decisions. A negative result may occur with  improper specimen collection/handling, submission of specimen other than nasopharyngeal swab, presence of viral mutation(s) within the areas targeted by this assay, and inadequate number of viral copies(<138 copies/mL). A negative result must be combined with clinical observations, patient history, and epidemiological information. The expected result is Negative.  Fact Sheet for Patients:  EntrepreneurPulse.com.au  Fact Sheet for Healthcare Providers:  IncredibleEmployment.be  This test is no t yet approved or cleared by the [place] FDA and  has been authorized for detection and/or diagnosis of SARS-CoV-2 by FDA under an Emergency Use Authorization (EUA). This EUA will remain  in effect (meaning this test can be used) for the duration of the COVID-19 declaration under Section 564(b)(1) of the Act, 21 U.S.C.section 360bbb-3(b)(1), unless the authorization is terminated  or revoked sooner.       Influenza A by PCR NEGATIVE NEGATIVE Final   Influenza B by PCR NEGATIVE NEGATIVE Final    Comment: (NOTE) The Xpert Xpress SARS-CoV-2/FLU/RSV plus assay is intended as an aid in the diagnosis of influenza from Nasopharyngeal swab  specimens and should not be used as a sole basis for treatment. Nasal washings and aspirates are unacceptable for Xpert Xpress SARS-CoV-2/FLU/RSV testing.  Fact Sheet for Patients: EntrepreneurPulse.com.au  Fact Sheet for Healthcare Providers: IncredibleEmployment.be  This test is not yet approved or cleared by the [place] FDA and has been authorized for detection and/or diagnosis of SARS-CoV-2 by FDA under an Emergency Use Authorization (EUA). This EUA will remain in effect (meaning this test can be used) for the duration of the COVID-19 declaration under Section 564(b)(1) of the Act, 21 U.S.C. section 360bbb-3(b)(1), unless the authorization is terminated or revoked.     Resp Syncytial Virus by PCR NEGATIVE NEGATIVE Final    Comment: (NOTE) Fact Sheet for Patients: EntrepreneurPulse.com.au  Fact Sheet for Healthcare Providers: IncredibleEmployment.be  This test is not yet approved or cleared by the [place] FDA and has been authorized for detection and/or diagnosis of SARS-CoV-2 by FDA under an Emergency Use Authorization (EUA). This EUA will remain in effect (meaning this test can be used) for the duration of the COVID-19 declaration under Section 564(b)(1) of the Act, 21 U.S.C. section 360bbb-3(b)(1), unless the authorization is terminated or revoked.  Performed at [facility], [place] [address] [place], [place] [address]      Time coordinating discharge: 35 minutes  SIGNED: Antonieta Pert, MD  [facility] 07/18/2020, 1:01 PM  If 7PM-7AM, please contact night-coverage [email or web address]

## 2020-07-18 NOTE — Care Management (Signed)
1129 07-18-20 Case Manager spoke with son regarding transition home. Son states he is always home with the patient 24/7. Patient is without insurance; Physical Therapy stated no HH needs and Occupational Therapy stated [facility] with 24/7 supervision. We discussed charity care and the son stated he felt like his mom would be ok without any services and he will be there to support her. Son is aware to call the [facility] to establish a hospital follow up appointment. No further needs from Case Manager at this time. Bethena Roys, RN,BSN Case Manager

## 2020-07-18 NOTE — Plan of Care (Signed)

## 2020-07-18 NOTE — Progress Notes (Signed)
Subjective: Patient seen in room with son present.  Per son back to baseline.  Denies shortness of breath chest pain abdominal pain. HD held yesterday secondary emergent patient load  Objective Vital signs in last 24 hours: Vitals:   07/17/20 0615 07/17/20 0800 07/18/20 0414 07/18/20 0812  BP: (!) 125/50  (!) 164/54 (!) 162/56  Pulse: 94 87 78 76  Resp: _0 Temp: 98.4 F (36.9 C)  98.8 F (37.1 C) 99.3 F (37.4 C)  TempSrc: Oral  Oral Axillary  SpO2: 96% 99% 99% 95%  Weight:   60.5 kg   Height:       Weight change:   Physical Exam: General: Elderly Hispanic female, NAD Heart: RRR no MRG Lungs: CTA room air Abdomen: Obesity bs  normoactive, soft nontender nondistended Extremities: No pedal edema ialysis Access: Left upper arm AV fistula positive bruit, right IJ temp HD cath   OP EH:UDJSH TTS 4h 62.5kg 2/2 bath RUA AVF Hep 2000 - mircera 50 q2 last 1/4 - venofer 50 qwk - hect 5 ug tiw   Problem/Plan; 1. AMS - due to uremia from clotted access and missed HD x 2 (has some dementia baseline).,  Now back to baseline MS. had hemodialysis late 1/14 into 1/15 am . temp cath placed 1/12 byIR , 1/14 IR declot RU AVF. 2. Clotted RUA AVF -.  Status post IR declot 1/14. 3. ESRD - on HD TTS. Missed 2 HD d/t clotted access.Had HD 1/12 and again late last 1/14 into am 1/15 Sat AM labs okay today and volumes okay no need HD today since just came off this am. Next HD would be Tuesday on schedule as outpatient 4. BP/ volume - no vol on, BP's dropped w/ attempted UF 1st hd here.  A.m. weight today 2 kg below EDW. ( Bed weight not accurate) no change in EDW at discharge 5. 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 Cont vdra, sensipar, binder 7.  Disposition okay for discharge per renal we will let outpatient kidney center know about Tuesday HD   Ernest Haber, PA-C [place] [phone] 07/18/2020,9:42 AM   LOS: 4 days   Labs: Basic Metabolic Panel: Recent Labs  Lab 07/14/20 2122 07/15/20 0427 07/15/20 1120 07/16/20 0300 07/16/20 1000 07/17/20 0332 07/17/20 0818 07/17/20 1223 07/17/20 1912 07/18/20 0705  NA 142 137  --  137  --  135  --   --   --  138  K 6.5* 5.0   < > 5.7*   < > 3.3*   < > 3.8 4.0 4.0  CL 100 97*  --  98  --  94*  --   --   --  98  CO2 24 27  --  25  --  27  --   --   --  28  GLUCOSE 114* 146*  --  172*  --  116*  --   --   --  146*  BUN 129* 37*  --  50*  --  20  --   --   --  26*  CREATININE 17.12* 7.86*  --  10.47*  --  4.56*  --   --   --  7.10*  CALCIUM 9.4 9.0  --  9.4  --  8.5*  --   --   --  8.5*  PHOS 6.8* 4.0  --   --   --   --   --   --   --   --    < > =  values in this interval not displayed.   Liver Function Tests: Recent Labs  Lab 07/14/20 2122 07/15/20 0427  ALBUMIN 2.7* 2.7*   No results for input(s): LIPASE, AMYLASE in the last 168 hours. Recent Labs  Lab 07/15/20 1120  AMMONIA 23   CBC: Recent Labs  Lab 07/14/20 1210 07/15/20 0332 07/16/20 0300 07/17/20 0332 07/18/20 0705  WBC 7.0 5.3 6.3 5.3 4.3  HGB 11.7* 10.0* 10.8* 10.2* 9.3*  HCT 39.0 31.9* 35.3* 33.3* 29.2*  MCV 104.6* 100.6* 101.4* 102.1* 99.7  PLT 204 180 175 145* 145*   Cardiac Enzymes: No results for input(s): CKTOTAL, CKMB, CKMBINDEX, TROPONINI in the last 168 hours. CBG: Recent Labs  Lab 07/17/20 0758 07/17/20 1203 07/17/20 1714 07/17/20 2129 07/18/20 0810  GLUCAP 143* 172* 113* 183* 138*    Studies/Results: IR US Guide Vasc Access Right  Result Date: 07/16/2020 INDICATION: Clotted AV fistula. 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 Note, patient also underwent placement of a temporary hemodialysis catheter 07/14/2020. EXAM: 1. FISTULALYSIS 2. ANGIOPLASTY OF VENOUS LIMB AND VENOUS ANASTOMOSIS 3. ULTRASOUND GUIDANCE FOR VENOUS ACCESS COMPARISON:  Right upper  arm fistulagram with intervention-04/21/2020 Image guided placement of a temporary dialysis catheter-07/14/2020 MEDICATIONS: Heparin 400 units IV; TPA 4 mg into the fistula. CONTRAST:  25 cc Omnipaque 300 ANESTHESIA/SEDATION: Moderate (conscious) sedation was employed during this procedure. A total of Versed 1 mg and Fentanyl 50 mcg was administered intravenously. Moderate Sedation Time: 63 minutes. The patient's level of consciousness and vital signs were monitored continuously by radiology nursing throughout the procedure under my direct supervision. FLUOROSCOPY TIME:  9 minutes, 18 seconds (25 mGy) COMPLICATIONS: None immediate. TECHNIQUE: Informed written consent was obtained from the patient after a discussion of the risks, benefits and alternatives to treatment. Questions regarding the procedure were encouraged and answered. A timeout was performed prior to the initiation of the procedure. On physical examination, the existing right upper arm arm dialysis graft was negative for palpable pulse or thrill. The skin overlying the graft was prepped and draped in the usual sterile fashion, and a sterile drape was applied covering the operative field. Maximum barrier sterile technique with sterile gowns and gloves were used for the procedure. Under ultrasound guidance, the dialysis fistula was accessed directed towards the venous anastomosis with a micropuncture kit after the overlying soft tissues were anesthetized with 1% lidocaine. An ultrasound image was saved for documentation purposes. The micropuncture sheath was exchange for a 7-French vascular sheath over a guidewire. Over a Benson wire, a Kumpe catheter was advanced centrally and a central venogram was performed. Pull back venogram was performed with the Kumpe catheter. Heparin was administered systemically and TPA was administered via the Kumpe catheter throughout near the entirety of the venous limb. The majority of the venous limb was angioplastied  initially with a 6 mm x 4 cm Conquest balloon and ultimately with a 7 mm x 4 cm Conquest balloon. An additional access was obtained directed towards the arterial anastomoses with a micropuncture kit after the overlying soft tissues anesthetized with 1% lidocaine. This allowed for placement of a 6-French vascular sheath. The graft was thrombectomized with several rounds of push-pull mechanical thrombectomy with an occlusion balloon. Flow was restored to the graft as evidenced by restored pulsatility of the graft and brisk blood return from the side arm of the vascular sheath. Shuntograms were performed. Several additional rounds of post fall thrombectomy was performed with the use the  occlusion balloon followed by additional angioplasty to 7 mm diameter at multiple stations along the access site. Completion fistulagram images were obtained and the procedure was terminated At this point, the procedure was terminated. All wires, catheters and sheaths were removed from the patient. Hemostasis was achieved at both access sites with deployment of a swizzle sutures which will be removed at the patient's next dialysis session. Dressings were applied. The patient tolerated the procedure without immediate postprocedural complication. FINDINGS: The existing left upper arm brachial-cephalic AV fistula thrombosed to the left of the proximal third of the humerus. The cephalic arch is noted to be patent. Near the entirety of the venous limb was ultimately angioplastied to 7 mm diameter. The fistula was successfully thrombectomized using mechanical and pharmacologic means as above. Initial post lysis fistulagram images demonstrate a minimal amount of adherent nonocclusive thrombus within the stick zone of the fistula. The adherent thrombus was then treated with several additional rounds of post pole thrombectomy with the occlusion device as well as prolonged angioplasty with a 7 mm diameter Conquest balloon. Completion fistulagram  images demonstrate wide patency of the AV fistula through the level of the cephalic arch. Reflux fistulagram demonstrates wide patency of the arterial limb and anastomosis. The central venous system of the left upper extremity is widely patent to the level of the superior aspect of the right atrium. IMPRESSION: 1. Technically successful left upper arm brachiocephalic AV fistula thrombolysis. 2. Successful angioplasty of the venous limb to 7 mm diameter. Completion shuntogram demonstrates the venous limb is widely patent. 3. The arterial anastomosis and arterial limb are widely patent. 4. The central venous system is widely patent. ACCESS: - Recommend maintaining the non tunneled right jugular approach dialysis catheter until at least two sessions of successful dialysis are achieved via the upper arm fistula. - Assuming restored functionality and patency, the AV fistula would be amenable to future percutaneous intervention as clinically indicated. Electronically Signed   By: [name] M.D.   On: 07/16/2020 16:27   IR THROMBECTOMY AV FISTULA W/THROMBOLYSIS INC/SHUNT/IMG RIGHT  Result Date: 07/16/2020 INDICATION: Clotted AV fistula. 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 Note, patient also underwent placement of a temporary hemodialysis catheter 07/14/2020. EXAM: 1. FISTULALYSIS 2. ANGIOPLASTY OF VENOUS LIMB AND VENOUS ANASTOMOSIS 3. ULTRASOUND GUIDANCE FOR VENOUS ACCESS COMPARISON:  Right upper arm fistulagram with intervention-04/21/2020 Image guided placement of a temporary dialysis catheter-07/14/2020 MEDICATIONS: Heparin 400 units IV; TPA 4 mg into the fistula. CONTRAST:  25 cc Omnipaque 300 ANESTHESIA/SEDATION: Moderate (conscious) sedation was employed during this procedure. A total of Versed 1 mg and Fentanyl 50 mcg was administered intravenously. Moderate Sedation Time: 63 minutes. The patient's level of  consciousness and vital signs were monitored continuously by radiology nursing throughout the procedure under my direct supervision. FLUOROSCOPY TIME:  9 minutes, 18 seconds (25 mGy) COMPLICATIONS: None immediate. TECHNIQUE: Informed written consent was obtained from the patient after a discussion of the risks, benefits and alternatives to treatment. Questions regarding the procedure were encouraged and answered. A timeout was performed prior to the initiation of the procedure. On physical examination, the existing right upper arm arm dialysis graft was negative for palpable pulse or thrill. The skin overlying the graft was prepped and draped in the usual sterile fashion, and a sterile drape was applied covering the operative field. Maximum barrier sterile technique with sterile gowns and gloves were used for the procedure. Under ultrasound guidance, the dialysis  fistula was accessed directed towards the venous anastomosis with a micropuncture kit after the overlying soft tissues were anesthetized with 1% lidocaine. An ultrasound image was saved for documentation purposes. The micropuncture sheath was exchange for a 7-French vascular sheath over a guidewire. Over a Benson wire, a Kumpe catheter was advanced centrally and a central venogram was performed. Pull back venogram was performed with the Kumpe catheter. Heparin was administered systemically and TPA was administered via the Kumpe catheter throughout near the entirety of the venous limb. The majority of the venous limb was angioplastied initially with a 6 mm x 4 cm Conquest balloon and ultimately with a 7 mm x 4 cm Conquest balloon. An additional access was obtained directed towards the arterial anastomoses with a micropuncture kit after the overlying soft tissues anesthetized with 1% lidocaine. This allowed for placement of a 6-French vascular sheath. The graft was thrombectomized with several rounds of push-pull mechanical thrombectomy with an occlusion  balloon. Flow was restored to the graft as evidenced by restored pulsatility of the graft and brisk blood return from the side arm of the vascular sheath. Shuntograms were performed. Several additional rounds of post fall thrombectomy was performed with the use the occlusion balloon followed by additional angioplasty to 7 mm diameter at multiple stations along the access site. Completion fistulagram images were obtained and the procedure was terminated At this point, the procedure was terminated. All wires, catheters and sheaths were removed from the patient. Hemostasis was achieved at both access sites with deployment of a swizzle sutures which will be removed at the patient's next dialysis session. Dressings were applied. The patient tolerated the procedure without immediate postprocedural complication. FINDINGS: The existing left upper arm brachial-cephalic AV fistula thrombosed to the left of the proximal third of the humerus. The cephalic arch is noted to be patent. Near the entirety of the venous limb was ultimately angioplastied to 7 mm diameter. The fistula was successfully thrombectomized using mechanical and pharmacologic means as above. Initial post lysis fistulagram images demonstrate a minimal amount of adherent nonocclusive thrombus within the stick zone of the fistula. The adherent thrombus was then treated with several additional rounds of post pole thrombectomy with the occlusion device as well as prolonged angioplasty with a 7 mm diameter Conquest balloon. Completion fistulagram images demonstrate wide patency of the AV fistula through the level of the cephalic arch. Reflux fistulagram demonstrates wide patency of the arterial limb and anastomosis. The central venous system of the left upper extremity is widely patent to the level of the superior aspect of the right atrium. IMPRESSION: 1. Technically successful left upper arm brachiocephalic AV fistula thrombolysis. 2. Successful angioplasty of the  venous limb to 7 mm diameter. Completion shuntogram demonstrates the venous limb is widely patent. 3. The arterial anastomosis and arterial limb are widely patent. 4. The central venous system is widely patent. ACCESS: - Recommend maintaining the non tunneled right jugular approach dialysis catheter until at least two sessions of successful dialysis are achieved via the upper arm fistula. - Assuming restored functionality and patency, the AV fistula would be amenable to future percutaneous intervention as clinically indicated. Electronically Signed   By: [name] M.D.   On: 07/16/2020 16:27   Medications:  . atorvastatin  40 mg Oral Daily  . Chlorhexidine Gluconate Cloth  6 each Topical Q0600  . cinacalcet  60 mg Oral Q breakfast  . collagenase   Topical Daily  . doxercalciferol  5 mcg Intravenous Q T,Th,Sa-HD  . DULoxetine  60 mg Oral Daily  . feeding supplement (NEPRO CARB STEADY)  237 mL Oral Q T,Th,Sa-HD  . heparin  5,000 Units Subcutaneous Q12H  . insulin aspart  0-6 Units Subcutaneous TID WC  . loratadine  10 mg Oral Daily  . mupirocin ointment  1 application Topical BID  . pantoprazole  40 mg Oral Daily  . sucroferric oxyhydroxide  500 mg Oral BID WC

## 2020-07-19 ENCOUNTER — Telehealth: Payer: Self-pay | Admitting: Nurse Practitioner

## 2020-07-19 ENCOUNTER — Encounter (HOSPITAL_COMMUNITY): Payer: Self-pay

## 2020-07-19 ENCOUNTER — Telehealth: Payer: Self-pay

## 2020-07-19 NOTE — Telephone Encounter (Signed)
Transition of care contact from inpatient facility  Date of Discharge: 07/18/20 Date of Contact: 07/19/20 Method of contact: Phone  Attempted to contact patient's son to discuss transition of care from inpatient admission. Patient's son  did not answer the phone. Voice mailbox is full, unable to leave message.

## 2020-07-19 NOTE — Telephone Encounter (Signed)
Transition Care Management Unsuccessful Follow-up Telephone Call Call placed with the assistance of [employer] # [id number].  Date of discharge and from where:  07/18/2020, [facility]   Attempts:  1st Attempt  Reason for unsuccessful TCM follow-up call:  Left voice messages on # [phone] and [phone]. Call back requested to this CM.  Patient needs to schedule follow up appointment with Dr Margarita Rana

## 2020-07-20 ENCOUNTER — Telehealth: Payer: Self-pay

## 2020-07-20 NOTE — Telephone Encounter (Signed)
Transition Care Management Follow-up Telephone Call    Calls placed with assistance of [employer] #  [id number].Messages left at # [phone] and [phone]  Call was completed with patient's son, [name] # [phone].  He was with the patient at dialysis at the time of this call.   Date of discharge and from where: 07/18/2020, [facility]   How have you been since you were released from the hospital? He said she is doing very well and her fistula is working fine..   Any questions or concerns? Yes -he explained that he continues to change the dressing on her left heel daily - santyl, moist gauze and dry dressing. He said that the wound looks good but his mother complains of itching at site and she pinches the wound and " peels layers."  He is considering calling another podiatrist to have the wound evaluated.   Items Reviewed:  Did the pt receive and understand the discharge instructions provided? Yes   Medications obtained and verified? Yes  - he said that they have all of her medications and he did not have any questions about the med regime.   Other? No   Any new allergies since your discharge? No   Do you have support at home? Yes  - she lives with her son  [place] and Equipment/Supplies: Were home health services ordered? no If so, what is the name of the agency? n/a Has the agency set up a time to come to the patient's home? n/a Were any new equipment or medical supplies ordered?  No What is the name of the medical supply agency?  n/a Were you able to get the supplies/equipment? n/a Do you have any questions related to the use of the equipment or supplies? No, n/a  Has glucometer and her son checks her blood sugars as ordered.   She attends HD T/T/S at [facility]. He said that he stays with her during HD because at times, she will try to leave early and the staff will end up calling him to come and stay with her.    Functional Questionnaire: (I = Independent and D = Dependent) ADLs: patient's daughter helps with bathing and dressing.  [name] provides other needed assistance. He said his mother is weak and sits most of the time and walks very little. She needs assistance with ambulation. They have a wheelchair to use when needed.  Marcial manages her medications  Follow up appointments reviewed:   PCP Hospital f/u appt confirmed? Yes  - Dr Margarita Rana 07/28/2020 @ [facility] f/u appt confirmed? No  - none scheduled at this time that he remembers.    Are transportation arrangements needed? No  - son provides transportation   If their condition worsens, is the pt aware to call PCP or go to the Emergency Dept.?  Yes, her son is aware  Was the patient provided with contact information for the PCP's office or ED? Yes, he was given the phone number for  [facility]  Was to pt encouraged to call back with questions or concerns?yes

## 2020-07-20 NOTE — Telephone Encounter (Signed)
From the discharge call that was completed with patient's son, [name].  Appointment with Dr Margarita Rana scheduled for 07/28/2020.   He said she is doing very well and her fistula is working fine.    he explained that he continues to change the dressing on her left heel daily - santyl, moist gauze and dry dressing. He said that the wound looks good but his mother complains of itching at site and she pinches the wound and " peels layers."  He is considering calling another podiatrist to have the wound evaluated.     he said that they have all of her medications and he did not have any questions about the med regime.    Has glucometer and her son checks her blood sugars as ordered.    She attends HD T[facility]T/S at [facility]. He said that he stays with her during HD because at times, she will try to leave early and the staff will end up calling him to come and stay with her.     patient's daughter helps with bathing and dressing.  [name] provides other needed assistance. He said his mother is weak and sits most of the time and walks very little. She needs assistance with ambulation. They have a wheelchair to use when needed.  Marcial manages her medications

## 2020-07-22 MED FILL — LISINOPRIL 20 MG TABLET: 20 | 30 days supply | Qty: 30 | Fill #0

## 2020-07-22 MED FILL — ETHYL CHLORIDE AERO: 30 days supply | Qty: 116 | Fill #5

## 2020-07-28 ENCOUNTER — Other Ambulatory Visit: Payer: Self-pay | Admitting: Family Medicine

## 2020-07-28 ENCOUNTER — Ambulatory Visit: Payer: Self-pay | Attending: Family Medicine | Admitting: Family Medicine

## 2020-07-28 ENCOUNTER — Encounter: Payer: Self-pay | Admitting: Family Medicine

## 2020-07-28 ENCOUNTER — Other Ambulatory Visit: Payer: Self-pay

## 2020-07-28 VITALS — BP 150/74 | HR 65

## 2020-07-28 DIAGNOSIS — I10 Essential (primary) hypertension: Secondary | ICD-10-CM

## 2020-07-28 DIAGNOSIS — N186 End stage renal disease: Secondary | ICD-10-CM

## 2020-07-28 DIAGNOSIS — E1122 Type 2 diabetes mellitus with diabetic chronic kidney disease: Secondary | ICD-10-CM

## 2020-07-28 DIAGNOSIS — Z794 Long term (current) use of insulin: Secondary | ICD-10-CM

## 2020-07-28 DIAGNOSIS — R252 Cramp and spasm: Secondary | ICD-10-CM

## 2020-07-28 DIAGNOSIS — Z992 Dependence on renal dialysis: Secondary | ICD-10-CM

## 2020-07-28 DIAGNOSIS — E1149 Type 2 diabetes mellitus with other diabetic neurological complication: Secondary | ICD-10-CM

## 2020-07-28 MED ORDER — INSULIN GLARGINE 100 UNIT/ML ~~LOC~~ SOLN: SUBCUTANEOUS | 6 refills | Status: DC

## 2020-07-28 MED ORDER — LISINOPRIL 20 MG PO TABS: 20.0000 mg | ORAL_TABLET | Freq: Every day | ORAL | 6 refills | Status: DC

## 2020-07-28 MED ORDER — METHOCARBAMOL 500 MG PO TABS: 500.0000 mg | ORAL_TABLET | Freq: Two times a day (BID) | ORAL | 2 refills | Status: DC | PRN

## 2020-07-28 MED FILL — METHOCARBAMOL 500 MG TABS: 500 | 15 days supply | Qty: 30 | Fill #0

## 2020-07-28 NOTE — Patient Instructions (Signed)
Calambres y espasmos musculares Muscle Cramps and Spasms Los calambres y espasmos musculares se producen cuando un msculo o varios msculos se tensionan, y usted no lo puede Chief Technology Officer (Human resources officer involuntaria). Es un problema frecuente y Audiological scientist en cualquier msculo. El lugar ms frecuente es en los msculos de la pantorrilla. Los calambres y espasmos musculares son contracciones musculares involuntarias, pero existen algunas diferencias entre ambos:  Los calambres musculares son dolorosos. Pueden aparecer y Armed forces operational officer, y pueden durar unos segundos o hasta 15 minutos. Los calambres musculares a menudo son ms fuertes y duran ms que los [employer].  Los espasmos musculares pueden o no ser dolorosos. Tambin pueden durar solo unos segundos o mucho ms [employer]. Ciertas afecciones mdicas, como la diabetes o la enfermedad de Parkinson, pueden hacer que sea ms propenso a tener calambres o espasmos. Sin embargo, los calambres o espasmos generalmente no se deben a un problema subyacente grave. Las causas ms frecuentes incluyen las siguientes:  Hacer ms trabajo fsico o actividad fsica de lo que su cuerpo est preparado (sobrecarga).  Uso excesivo por repetir demasiadas veces ciertos movimientos.  Permanecer en determinada posicin durante un perodo prolongado.  Preparacin, tcnica o mtodo inadecuados al realizar un deporte o hacer [place].  Deshidratacin.  Lesiones.  Efectos secundarios de algunos medicamentos.  Niveles anormalmente muy bajos de las sales y los minerales de la sangre (electrolitos), en especial, de potasio y calcio. Esto puede ocurrir si usted toma diurticos o si est embarazada. En muchos casos, se desconoce la causa de los calambres o espasmos musculares. Siga estas indicaciones en su casa: Control del dolor y de la rigidez  Intente Community education officer, Training and development officer y Surveyor, mining msculo afectado. Hgalo durante varios minutos.  Si se lo indican,  aplique calor en los msculos tensos o tirantes con la frecuencia que le haya indicado el mdico. Use la fuente de calor que el mdico le recomiende, como una compresa de calor hmedo o una almohadilla trmica. ? Coloque una [employer] piel y la fuente de Freight forwarder. ? Aplique calor durante 20 a 19mnutos. ? Retire la fuente de calor si la piel se pone de color rojo brillante. Esto es especialmente importante si no puede sentir dolor, calor o fro. Puede correr un riesgo mayor de sufrir quemaduras.  Si se lo indican, aplique hielo sobre la zona afectada. Esto puede ayudar si despus de un calambre o espasmo tiene dolor o sensibilidad. ? Ponga el hielo en una bolsa plstica. ? Coloque una tGenuine Partspiel y lTherapist, nutritional ? Coloque el hielo durante 258mutos, 2 a 3veces por da.  Intente tomar duchas o baos con agua caliente para ayudar a relajar los msculos tirantes.      Comida y bebida  Beba suficiente lquido como para maTheatre managera orina de color amarillo plido. Mantenerse bien hidratado puede ayudar a evProduction assistant, radio espasmos.  Consuma una dieta saludable que incluya gran cantidad de nutrientes para ayudar al funcionamiento de los msculos. Una dieta saludable incluye frutas y verduras, protenas magras, cereales integrales y productos lcteos descremados o semidescremados. Indicaciones generales  Si tiene calambres frecuentes, evite el ejercicio intenso duUnited Stationers Tome los medicamentos de venta libre y los recetados solamente como se lo haya indicado el mdico.  Est atento a cualquier caBank of New York Companyntomas.  Concurra a todas las visitas de seguimiento como se lo haya indicado el mdico. Esto es importante. Comunquese con un mdico si:  Los calambres o espasmos son ms intensos  u ocurren con ms frecuencia.  Los calambres o espasmos no mejoran con Physiological scientist. Resumen  Los calambres y espasmos musculares se producen cuando un msculo o varios msculos se  tensionan, y usted no lo puede Chief Technology Officer (Glass blower/designer).  El lugar ms frecuente en el que ocurren calambres o espasmos es en los msculos de la pantorrilla.  Masajear, elongar y Surveyor, mining msculo afectado puede aliviar el calambre o espasmo muscular.  Beba suficiente lquido como para Theatre manager la orina de color amarillo plido. Mantenerse bien hidratado puede ayudar a Production assistant, radio o espasmos. Esta informacin no tiene [name] el consejo del mdico. Asegrese de hacerle al mdico cualquier pregunta que tenga. Document Revised: 12/27/2017 Document Reviewed: 12/27/2017 Elsevier Patient Education  2021 [name].

## 2020-07-28 NOTE — Progress Notes (Signed)
Subjective:  Patient ID: Sarah Kelley, female    DOB: [birth year]  Age: 70 y.o. 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 She was hospitalized at [facility] from 07/14/2020 through 07/18/2020 for acute metabolic encephalopathy secondary to hypoglycemia and missing her hemodialysis session due to clotting of her AV fistula. Found to be hyperkalemic with a potassium of 6.5, elevated creatinine of 17.3 and uremic with a BUN of 132.  She underwent urgent hemodialysis. During her hospital course she underwent declotting of her fistula by interventional radiology.  CT head revealed no acute findings.  She was subsequently discharged.  She complains of pain in her hands today. Also complains of pain in her feet and her knees.  Her son attributes this to the fact that she is wheelchair-bound and is unable to walk.  She has been receiving 10 units of Lantus bid due to sugars being low. They have ranged from the 80s to 120.  She has had no hypoglycemic episodes since discharge.  Has been compliant with hemodialysis sessions. Past Medical History:  Diagnosis Date  . Diabetes mellitus   . ESRD (end stage renal disease) ([place])    dialysis  . Hyperlipidemia   . Hypertension   . Iron deficiency anemia   . MRSA infection    hx of  . Neuropathy    Diabetic  . Pneumonia 07/2016  . Renal disorder   . Seizure-like activity ([place])   . Sepsis ([place])   . Thyroid disease   . Wears dentures     Past Surgical History:  Procedure Laterality Date  . AMPUTATION Left 05/21/2015    Procedure: Left Foot 3rd Ray Amputation;  Surgeon: Newt Minion, MD;  Location: [place];  Service: Orthopedics;  Laterality: Left;  . AV FISTULA PLACEMENT Left 03/28/2016   Procedure: ARTERIOVENOUS (AV) FISTULA CREATION LEFT ARM;  Surgeon: Waynetta Sandy, MD;  Location: [place];  Service: Vascular;  Laterality: Left;  . AV FISTULA PLACEMENT Right 06/04/2019   Procedure: ARTERIOVENOUS (AV) FISTULA CREATION RIGHT ARM;  Surgeon: Waynetta Sandy, MD;  Location: [place];  Service: Vascular;  Laterality: Right;  . DILATION AND CURETTAGE OF UTERUS    . EYE SURGERY    . FISTULA SUPERFICIALIZATION Left 09/04/2016   Procedure: FISTULA SUPERFICIALIZATION LEFT UPPER ARM;  Surgeon: Waynetta Sandy, MD;  Location: [place];  Service: Vascular;  Laterality: Left;  . FISTULA SUPERFICIALIZATION Right 10/08/2019   Procedure: FISTULA SUPERFICIALIZATION OR RIGHT ARM;  Surgeon: Waynetta Sandy, MD;  Location: [place];  Service: Vascular;  Laterality: Right;  . IR AV DIALY SHUNT INTRO NEEDLE/INTRAC INITIAL W/PTA/STENT/IMG LT Left 06/13/2017  . IR AV DIALY SHUNT INTRO NEEDLE/INTRAC INITIAL W/PTA/STENT/IMG LT Left 09/12/2017  . IR AV DIALY SHUNT INTRO NEEDLE/INTRACATH INITIAL W/PTA/IMG LEFT  11/10/2016  . IR AV DIALY SHUNT INTRO NEEDLE/INTRACATH INITIAL W/PTA/IMG LEFT  03/09/2017  . IR AV DIALY SHUNT INTRO NEEDLE/INTRACATH INITIAL W/PTA/IMG LEFT  12/12/2017  . IR AV DIALY SHUNT INTRO NEEDLE/INTRACATH INITIAL W/PTA/IMG LEFT  02/27/2018  . IR AV DIALY SHUNT INTRO NEEDLE/INTRACATH INITIAL W/PTA/IMG LEFT  06/05/2018  . IR AV DIALY SHUNT  INTRO NEEDLE/INTRACATH INITIAL W/PTA/IMG LEFT  09/11/2018  . IR AV DIALY SHUNT INTRO NEEDLE/INTRACATH INITIAL W/PTA/IMG RIGHT Right 04/21/2020  . IR DIALY SHUNT INTRO NEEDLE/INTRACATH INITIAL W/IMG LEFT Left 05/08/2018  . IR FLUORO GUIDE CV LINE RIGHT  02/01/2019  . IR FLUORO GUIDE CV LINE RIGHT  07/14/2020  . IR GENERIC HISTORICAL  08/01/2016   IR FLUORO GUIDE CV LINE RIGHT  08/01/2016 [name], MD [facility]  . IR GENERIC HISTORICAL  08/01/2016   IR US GUIDE VASC ACCESS RIGHT 08/01/2016 [name], MD [facility]  . IR REMOVAL TUN CV CATH W/O FL  11/01/2016  . IR REMOVAL TUN CV CATH W/O FL  01/02/2020  . IR THROMBECTOMY AV FISTULA W/THROMBOLYSIS INC/SHUNT/IMG LEFT Left 01/31/2019  . IR THROMBECTOMY AV FISTULA W/THROMBOLYSIS/PTA INC/SHUNT/IMG LEFT Left 01/27/2019  . IR THROMBECTOMY AV FISTULA W/THROMBOLYSIS/PTA INC/SHUNT/IMG LEFT Left 07/16/2020  . IR US GUIDE VASC ACCESS LEFT  11/10/2016  . IR US GUIDE VASC ACCESS LEFT  09/12/2017  . IR US GUIDE VASC ACCESS LEFT  12/12/2017  . IR US GUIDE VASC ACCESS LEFT  06/05/2018  . IR US GUIDE VASC ACCESS LEFT  09/11/2018  . IR US GUIDE VASC ACCESS LEFT  01/27/2019  . IR US GUIDE VASC ACCESS LEFT  02/01/2019  . IR US GUIDE VASC ACCESS RIGHT  02/01/2019  . IR US GUIDE VASC ACCESS RIGHT  07/14/2020  . IR US GUIDE VASC ACCESS RIGHT  07/16/2020    No family history on file.  No Known Allergies  Outpatient Medications Prior to Visit  Medication Sig Dispense Refill  . atorvastatin (LIPITOR) 40 MG tablet Take 1 tablet (40 mg total) by mouth daily. 30 tablet 6  . B Complex-C-Zn-Folic Acid (DIALYVITE Q000111Q WITH ZINC) 0.8 MG TABS Take 1 tablet by mouth daily.    . Blood Glucose Monitoring Suppl (TRUE METRIX METER) DEVI 1 each by Does not apply route 3 (three) times daily before meals. 1 Device 0  . cetirizine (ZYRTEC) 10 MG tablet Take 1 tablet (10 mg total) by mouth daily. 30 tablet 1  . cinacalcet (SENSIPAR) 60 MG tablet Take 60 mg by mouth daily.    Marland Kitchen doxercalciferol (HECTOROL) 0.5 MCG capsule Take 0.5 mcg by mouth 3 (three) times a week.    . DULoxetine (CYMBALTA) 60 MG capsule Take 1 capsule (60 mg total) by mouth daily. 30 capsule 6  . ethyl chloride spray SPRAY 1 SPRAY TO THE SKIN 3 TIMES A WEEK AS NEEDED. SPRAY A SMALL AMOUNT JUST PRIOR TO NEEDLE INSERTION. (Patient taking differently: Apply 1 application topically 3 (three) times a  week. Spray a small amount prior to needle insertion at dialysis.) 116 mL 10  . gabapentin (NEURONTIN) 300 MG capsule Take 1 capsule (300 mg total) by mouth at bedtime. 30 capsule 2  . glucose blood (TRUE METRIX BLOOD GLUCOSE TEST) test strip Use 3 times daily before meals 100 each 12  . insulin glargine (LANTUS) 100 UNIT/ML injection Inject 0.05 mLs (5 Units total) into the skin daily. Hold if sugar < 120 10 mL 0  . Insulin Syringe-Needle U-100 (TRUEPLUS INSULIN SYRINGE) 31G X 5/16" 1 ML MISC USE AS DIRECTED 100 each 5  . lisinopril (ZESTRIL) 20 MG tablet TAKE 1 TABLET (20 MG TOTAL) BY MOUTH DAILY. 30 tablet 0  . Methoxy PEG-Epoetin Beta (MIRCERA IJ) Mircera    . mupirocin ointment (BACTROBAN) 2 % Apply 1 application topically 2 (two) times daily. 30 g 2  . Nutritional Supplements (FEEDING  SUPPLEMENT, NEPRO CARB STEADY,) LIQD Take 237 mLs by mouth Every Tuesday,Thursday,and Saturday with dialysis.     Marland Kitchen omeprazole (PRILOSEC) 20 MG capsule Take 1 capsule (20 mg total) by mouth daily. 30 capsule 3  . sucroferric oxyhydroxide (VELPHORO) 500 MG chewable tablet Chew 500 mg by mouth 2 (two) times daily with a meal.     . TRUEplus Lancets 28G MISC USE AS DIRECTED THREE TIMES DAILY TO TEST SUGAR BEFORE MEALS (Patient taking differently: 1 each by Other route 3 (three) times daily.) 100 each 12  . Amino Acids-Protein Hydrolys (FEEDING SUPPLEMENT, PRO-STAT SUGAR FREE 64,) LIQD Take 30 mLs by mouth 2 (two) times daily. (Patient not taking: No sig reported) 900 mL 0   Facility-Administered Medications Prior to Visit  Medication Dose Route Frequency Provider Last Rate Last Admin  . 0.9 %  sodium chloride infusion   Intravenous Continuous Monia Sabal, PA-C         ROS Review of Systems  Constitutional: Negative for activity change, appetite change and fatigue.  HENT: Negative for congestion, sinus pressure and sore throat.   Eyes: Negative for visual disturbance.  Respiratory: Negative for cough,  chest tightness, shortness of breath and wheezing.   Cardiovascular: Negative for chest pain and palpitations.  Gastrointestinal: Negative for abdominal distention, abdominal pain and constipation.  Endocrine: Negative for polydipsia.  Genitourinary: Negative for dysuria and frequency.  Musculoskeletal:       See HPI  Skin: Negative for rash.  Neurological: Negative for tremors, light-headedness and numbness.  Hematological: Does not bruise/bleed easily.  Psychiatric/Behavioral: Negative for agitation and behavioral problems.    Objective:  BP (!) 150/74   Pulse 65   LMP  (LMP Unknown)   SpO2 99%   BP/Weight 07/28/2020 07/18/2020 [id number]  Systolic BP Q000111Q 123456 Q000111Q  Diastolic BP 74 54 71  Wt. (Lbs) - 133.4 -  BMI - 26.05 32.06      Physical Exam Constitutional:      Appearance: She is well-developed.  Neck:     Vascular: No JVD.  Cardiovascular:     Rate and Rhythm: Normal rate.     Heart sounds: Normal heart sounds. No murmur heard.   Pulmonary:     Effort: Pulmonary effort is normal.     Breath sounds: Normal breath sounds. No wheezing or rales.  Chest:     Chest wall: No tenderness.  Abdominal:     General: Bowel sounds are normal. There is no distension.     Palpations: Abdomen is soft. There is no mass.     Tenderness: There is no abdominal tenderness.  Musculoskeletal:        General: Normal range of motion.     Right lower leg: No edema.     Left lower leg: No edema.  Neurological:     Mental Status: She is alert and oriented to person, place, and time.  Psychiatric:        Mood and Affect: Mood normal.     CMP Latest Ref Rng & Units 07/18/2020 07/17/2020 07/17/2020  Glucose 70 - 99 mg/dL 146(H) - -  BUN 8 - 23 mg/dL 26(H) - -  Creatinine 0.44 - 1.00 mg/dL 7.10(H) - -  Sodium 135 - 145 mmol/L 138 - -  Potassium 3.5 - 5.1 mmol/L 4.0 4.0 3.8  Chloride 98 - 111 mmol/L 98 - -  CO2 22 - 32 mmol/L 28 - -  Calcium 8.9 - 10.3 mg/dL 8.5(L) - -  Total Protein  6.0 - 8.5 g/dL - - -  Total Bilirubin 0.0 - 1.2 mg/dL - - -  Alkaline Phos 44 - 121 IU/L - - -  AST 0 - 40 IU/L - - -  ALT 0 - 32 IU/L - - -    Lipid Panel     Component Value Date/Time   CHOL 128 06/11/2020 1013   TRIG 89 06/11/2020 1013   HDL 37 (L) 06/11/2020 1013   CHOLHDL 3.5 06/11/2020 1013   CHOLHDL 4.9 01/13/2016 1051   VLDL 66 (H) 01/13/2016 1051   LDLCALC 74 06/11/2020 1013    CBC    Component Value Date/Time   WBC 4.3 07/18/2020 0705   RBC 2.93 (L) 07/18/2020 0705   HGB 9.3 (L) 07/18/2020 0705   HGB 9.7 (L) 06/11/2020 1013   HCT 29.2 (L) 07/18/2020 0705   HCT 29.6 (L) 06/11/2020 1013   PLT 145 (L) 07/18/2020 0705   PLT 190 06/11/2020 1013   MCV 99.7 07/18/2020 0705   MCV 95 06/11/2020 1013   MCH 31.7 07/18/2020 0705   MCHC 31.8 07/18/2020 0705   RDW 13.9 07/18/2020 0705   RDW 13.4 06/11/2020 1013   LYMPHSABS 1.5 06/11/2020 1013   MONOABS 0.5 01/31/2019 1030   EOSABS 0.2 06/11/2020 1013   BASOSABS 0.1 06/11/2020 1013    Lab Results  Component Value Date   HGBA1C 8.0 (A) 06/10/2020    Assessment & Plan:  1. Muscle cramp She will benefit from PT and home care but unfortunately has no medical coverage for home health referral Son has been encouraged to perform home exercises on the patient and provide her with small weights for hand exercises Trial of Robaxin - methocarbamol (ROBAXIN) 500 MG tablet; Take 1 tablet (500 mg total) by mouth 2 (two) times daily as needed for muscle spasms.  Dispense: 30 tablet; Refill: 2  2. Essential hypertension Slightly elevated Permissive hypertension to prevent hypotension during hemodialysis session - lisinopril (ZESTRIL) 20 MG tablet; Take 1 tablet (20 mg total) by mouth daily.  Dispense: 30 tablet; Refill: 6  3. Other diabetic neurological complication associated with type 2 diabetes mellitus (HCC) Stable on gabapentin  4. Type 2 diabetes mellitus with chronic kidney disease on chronic dialysis, with long-term  current use of insulin (HCC) A1c of 8.0 Her goal is less than 8.0 due to her multiple comorbidities Continue current regimen Educated son on management of hypoglycemia and advised to decrease Lantus by 2 units if this occurs Counseled on Diabetic diet, my plate method, [id number] minutes of moderate intensity exercise/week Blood sugar logs with fasting goals of 80-120 mg/dl, random of less than 180 and in the event of sugars less than 60 mg/dl or greater than 400 mg/dl encouraged to notify the clinic. Advised on the need for annual eye exams, annual foot exams, Pneumonia vaccine. - insulin glargine (LANTUS) 100 UNIT/ML injection; Inject subcutaneously twice daily 15 units in the morning and 10 in the evening  Dispense: 30 mL; Refill: 6  5. Spasticity Secondary to reduced mobility See #1 above    No orders of the defined types were placed in this encounter.  Return in about 3 months (around 10/26/2020) for Chronic disease management.       [name], MD, FAAFP. [facility] [place], [place]   07/28/2020, 11:52 AM

## 2020-07-30 MED FILL — TRUEPLUS SYR 1ML 31GX5/16: 31G X 5/16" | 30 days supply | Qty: 100 | Fill #2

## 2020-08-16 MED FILL — DULoxetine HCL 60 MG CPEP: 60 | 30 days supply | Qty: 30 | Fill #0

## 2020-08-16 MED FILL — OMEPRAZOLE 20 MG CAP: 20 | 30 days supply | Qty: 30 | Fill #2

## 2020-08-16 MED FILL — LANTUS 100 UNITS/ML VIAL: 100 | 28 days supply | Qty: 10 | Fill #0

## 2020-08-16 MED FILL — ATORVASTATIN CALCIUM 40 MG: 40 | 30 days supply | Qty: 30 | Fill #2

## 2020-08-16 MED FILL — GABAPENTIN 300 MG CAPSULE: 300 | 30 days supply | Qty: 30 | Fill #0

## 2020-08-23 MED FILL — LISINOPRIL 20 MG TABLET: 20 | 30 days supply | Qty: 30 | Fill #0

## 2020-08-25 MED FILL — ETHYL CHLORIDE AERO: 30 days supply | Qty: 116 | Fill #6

## 2020-09-08 ENCOUNTER — Ambulatory Visit: Payer: Self-pay | Admitting: Family Medicine

## 2020-09-17 ENCOUNTER — Other Ambulatory Visit (HOSPITAL_COMMUNITY): Payer: Self-pay | Admitting: Nephrology

## 2020-09-17 DIAGNOSIS — Z992 Dependence on renal dialysis: Secondary | ICD-10-CM

## 2020-09-17 DIAGNOSIS — N186 End stage renal disease: Secondary | ICD-10-CM

## 2020-09-23 ENCOUNTER — Other Ambulatory Visit: Payer: Self-pay | Admitting: Radiology

## 2020-09-23 ENCOUNTER — Other Ambulatory Visit: Payer: Self-pay | Admitting: Student

## 2020-09-24 ENCOUNTER — Other Ambulatory Visit (HOSPITAL_COMMUNITY): Payer: Self-pay | Admitting: Nephrology

## 2020-09-24 ENCOUNTER — Ambulatory Visit (HOSPITAL_COMMUNITY)
Admission: RE | Admit: 2020-09-24 | Discharge: 2020-09-24 | Disposition: A | Discharge status: Home / Self Care | Payer: Self-pay | Source: Ambulatory Visit | Attending: Nephrology | Admitting: Nephrology

## 2020-09-24 ENCOUNTER — Other Ambulatory Visit: Payer: Self-pay

## 2020-09-24 ENCOUNTER — Encounter (HOSPITAL_COMMUNITY): Payer: Self-pay

## 2020-09-24 DIAGNOSIS — Z79899 Other long term (current) drug therapy: Secondary | ICD-10-CM | POA: Insufficient documentation

## 2020-09-24 DIAGNOSIS — T82858A Stenosis of vascular prosthetic devices, implants and grafts, initial encounter: Secondary | ICD-10-CM | POA: Insufficient documentation

## 2020-09-24 DIAGNOSIS — N186 End stage renal disease: Secondary | ICD-10-CM | POA: Insufficient documentation

## 2020-09-24 DIAGNOSIS — I120 Hypertensive chronic kidney disease with stage 5 chronic kidney disease or end stage renal disease: Secondary | ICD-10-CM | POA: Insufficient documentation

## 2020-09-24 DIAGNOSIS — Z794 Long term (current) use of insulin: Secondary | ICD-10-CM | POA: Insufficient documentation

## 2020-09-24 DIAGNOSIS — E1122 Type 2 diabetes mellitus with diabetic chronic kidney disease: Secondary | ICD-10-CM | POA: Insufficient documentation

## 2020-09-24 DIAGNOSIS — Z992 Dependence on renal dialysis: Secondary | ICD-10-CM | POA: Insufficient documentation

## 2020-09-24 DIAGNOSIS — Y841 Kidney dialysis as the cause of abnormal reaction of the patient, or of later complication, without mention of misadventure at the time of the procedure: Secondary | ICD-10-CM | POA: Insufficient documentation

## 2020-09-24 HISTORY — PX: IR THROMBECTOMY AV FISTULA W/THROMBOLYSIS/PTA/STENT INC/SHUNT/IMG RT: IMG6120

## 2020-09-24 LAB — GLUCOSE, CAPILLARY
Glucose-Capillary: 52 mg/dL — ABNORMAL LOW (ref 70–99)
Glucose-Capillary: 101 mg/dL — ABNORMAL HIGH (ref 70–99)
Glucose-Capillary: 60 mg/dL — ABNORMAL LOW (ref 70–99)
Glucose-Capillary: 113 mg/dL — ABNORMAL HIGH (ref 70–99)
Glucose-Capillary: 122 mg/dL — ABNORMAL HIGH (ref 70–99)

## 2020-09-24 LAB — BASIC METABOLIC PANEL
Anion gap: 8 (ref 5–15)
BUN: 78 mg/dL — ABNORMAL HIGH (ref 8–23)
CO2: 28 mmol/L (ref 22–32)
Calcium: 9.6 mg/dL (ref 8.9–10.3)
Chloride: 99 mmol/L (ref 98–111)
Creatinine, Ser: 9.6 mg/dL — ABNORMAL HIGH (ref 0.44–1.00)
GFR, Estimated: 4 mL/min — ABNORMAL LOW (ref >60)
Glucose, Bld: 57 mg/dL — ABNORMAL LOW (ref 70–99)
Potassium: 6.5 mmol/L (ref 3.5–5.1)
Sodium: 135 mmol/L (ref 135–145)

## 2020-09-24 MED ORDER — FENTANYL CITRATE (PF) 100 MCG/2ML IJ SOLN
INTRAMUSCULAR | Status: DC | PRN
  Administered 2020-09-24: 50 ug via INTRAVENOUS

## 2020-09-24 MED ORDER — SODIUM CHLORIDE 0.9 % IV SOLN: INTRAVENOUS | Status: DC

## 2020-09-24 MED ORDER — IOHEXOL 300 MG/ML  SOLN
100.0000 mL | Freq: Once | INTRAMUSCULAR | Status: AC | PRN
  Administered 2020-09-24: 20 mL via INTRA_ARTERIAL

## 2020-09-24 MED ORDER — DEXTROSE 50 % IV SOLN
INTRAVENOUS | Status: AC
  Administered 2020-09-24: 25 mL via INTRAVENOUS
  Filled 2020-09-24: qty 50

## 2020-09-24 MED ORDER — MIDAZOLAM HCL 2 MG/2ML IJ SOLN
INTRAMUSCULAR | Status: DC | PRN
  Administered 2020-09-24: 1 mg via INTRAVENOUS

## 2020-09-24 MED ORDER — FENTANYL CITRATE (PF) 100 MCG/2ML IJ SOLN
INTRAMUSCULAR | Status: AC
  Filled 2020-09-24: qty 2

## 2020-09-24 MED ORDER — DEXTROSE 50 % IV SOLN: 25.0000 mL | Freq: Once | INTRAVENOUS | Status: AC

## 2020-09-24 MED ORDER — LIDOCAINE HCL 1 % IJ SOLN
INTRAMUSCULAR | Status: AC
  Filled 2020-09-24: qty 20

## 2020-09-24 MED ORDER — MIDAZOLAM HCL 2 MG/2ML IJ SOLN
INTRAMUSCULAR | Status: AC
  Filled 2020-09-24: qty 4

## 2020-09-24 MED ORDER — DEXTROSE 50 % IV SOLN
25.0000 mL | Freq: Once | INTRAVENOUS | Status: AC
  Administered 2020-09-24: 25 mL via INTRAVENOUS

## 2020-09-24 MED ORDER — DEXTROSE 50 % IV SOLN
INTRAVENOUS | Status: AC
  Filled 2020-09-24: qty 50

## 2020-09-24 MED ORDER — IOHEXOL 300 MG/ML  SOLN
100.0000 mL | Freq: Once | INTRAMUSCULAR | Status: AC | PRN
  Administered 2020-09-24: 30 mL via INTRA_ARTERIAL

## 2020-09-24 NOTE — Sedation Documentation (Signed)
[facility] called to [facility] Headings, Therapist, sports. All questions answered to satisfaction.

## 2020-09-24 NOTE — Discharge Instructions (Signed)
Fistulografa de dilisis, cuidados posteriores Dialysis Fistulogram, Care After La siguiente informacin ofrece orientacin sobre cmo cuidarse despus del procedimiento. El mdico tambin podr darle instrucciones ms especficas. Comunquese con el mdico si tiene problemas o preguntas. Qu puedo esperar despus del procedimiento? Despus del procedimiento, es normal tener los siguientes sntomas:  Una pequea molestia en la zona en la que se coloc el tubo pequeo (catter) para el procedimiento.  Un pequeo hematoma alrededor de la fstula.  Somnolencia y cansancio Company secretary). Siga estas instrucciones en su casa: Cuidado del lugar de la puncin  Siga las instrucciones de su mdico acerca de cmo cuidar el lugar donde se insertaron los catteres. Asegrese de hacer lo siguiente: ? Lvese las manos con agua y jabn durante al menos 20segundos antes y despus de cambiarse la venda (vendaje). Use desinfectante para manos si no dispone de [place]. ? Cambie el vendaje como se lo haya indicado el mdico. ? No retire los puntos (suturas), la goma para cerrar la piel o las tiras [place]. Es posible que estos cierres cutneos deban quedar puestos en la piel durante 2semanas o ms tiempo. Si los bordes de las tiras adhesivas empiezan a despegarse y Therapist, sports, puede recortar los que estn sueltos. No retire las tiras [facility] por completo a menos que el mdico se lo indique.  Controle la zona de la puncin todos los das para detectar signos de infeccin. Est atento a los siguientes signos: ? Aumento del enrojecimiento, la hinchazn o Conservation officer, historic buildings. ? Lquido o sangre. ? Calor. ? Pus o mal olor.   Actividad  Descanse todo lo posible.  Si le administraron un sedante durante el procedimiento, puede afectarlo por varias horas. No conduzca ni opere maquinaria hasta que el mdico le indique que es seguro [place].  No levante ningn objeto que pese ms de 5libras (2.3kg) o el lmite de peso  que le hayan indicado el da del procedimiento.  No haga nada extenuante con el brazo durante el resto del da. Evite las [employer], como pasar la aspiradora.  Retome sus actividades normales segn lo indicado por el mdico. Pregntele al mdico qu actividades son seguras para usted. Seguridad Para evitar daar el injerto o la fstula:  No use ropa ajustada ni joyas en el brazo o la pierna que tiene el injerto o la fstula.  Informe a todos sus mdicos que tiene un injerto o una fstula para dilisis.  No permita extracciones de sangre, terapias intravenosas ni lecturas de la presin arterial en el brazo que tiene la fstula o el injerto.  No permita que le apliquen vacunas antigripales ni otras vacunas en el brazo con la fstula o el injerto. Instrucciones generales  Use los medicamentos de venta libre y los recetados solamente como se lo haya indicado el mdico.  No tome baos de inmersin, no nade ni use el jacuzzi hasta que el mdico lo autorice. Pregntele al mdico si puede ducharse. Thurston Pounds solo le permitan darse baos de [place].  Controle atentamente la fstula de dilisis. Verifique para asegurarse de que puede sentir una vibracin o un zumbido (frmito) cuando Risk manager los dedos sobre la fstula.  Cumpla con todas las visitas de seguimiento. Esto es importante. Comunquese con un mdico si:  Tiene ms enrojecimiento, hinchazn o dolor en el lugar donde se insert el catter.  Observa lquido o sangre que proviene del lugar de la insercin del catter.  Tiene pus o percibe mal olor que proviene del lugar de la insercin del catter.  El lugar del catter est caliente al tacto.  Tiene fiebre o escalofros. Solicite ayuda de inmediato si:  Presenta un sangrado en el lugar del acceso vascular, que no se detiene.  Se siente dbil.  Tiene problemas de equilibrio.  Tiene dificultad para mover los brazos o las piernas.  Tiene problemas visuales o para  hablar.  Ya no puede sentir una vibracin o un zumbido cuando [employer] dedos [employer] fstula.  El miembro que se utiliz para el procedimiento se hincha o se torna doloroso, fro, azul o blanco plido.  Siente falta de aire o Tourist information centre manager. Estos sntomas pueden representar un problema grave que constituye Engineer, maintenance (IT). No espere a ver si los sntomas desaparecen. Solicite atencin mdica de inmediato. Comunquese con el servicio de emergencias de su localidad (911 en los [place]). No conduzca por sus propios medios [employer] Financial. Resumen  Despus de una fistulografa de dilisis, es comn tener una pequea molestia o algunos moretones en la zona donde se coloc el tubo delgado y pequeo (catter).  Descanse todo lo que pueda despus del procedimiento. Retome sus actividades normales segn lo indicado por el mdico.  Use los medicamentos de venta libre y los recetados solamente como se lo haya indicado el mdico.  Siga las instrucciones de su mdico acerca de cmo Government social research officer donde se insert el catter.  Cumpla con todas las visitas de seguimiento. Esto es importante. Esta informacin no tiene [name] el consejo del mdico. Asegrese de hacerle al mdico cualquier pregunta que tenga. Document Revised: 05/05/2020 Document Reviewed: 05/05/2020 Elsevier Patient Education  2021 [place].    Dialysis Fistulogram, Care After The following information offers guidance on how to care for yourself after your procedure. Your health care provider may also give you more specific instructions. If you have problems or questions, contact your health care provider. What can I expect after the procedure? After the procedure, it is common to have:  A small amount of discomfort in the area where the small tube (catheter) was placed for the procedure.  A small amount of bruising around the fistula.  Sleepiness and tiredness (fatigue). Follow these instructions  at home: Puncture site care  Follow instructions from your health care provider about how to take care of the site where catheters were inserted. Make sure you: ? Wash your hands with soap and water for at least 20 seconds before and after you change your bandage (dressing). If soap and water are not available, use hand sanitizer. ? Change your dressing as told by your health care provider. ? Leave stitches (sutures), skin glue, or adhesive strips in place. These skin closures may need to stay in place for 2 weeks or longer. If adhesive strip edges start to loosen and curl up, you may trim the loose edges. Do not remove adhesive strips completely unless your health care provider tells you to do that.  Check your puncture area every day for signs of infection. Check for: ? More redness, swelling, or pain. ? Fluid or blood. ? Warmth. ? Pus or a bad smell.   Activity  Rest as much as you can.  If you were given a sedative during the procedure, it can affect you for several hours. Do not drive or operate machinery until your health care provider says that it is safe.  Do not lift anything that is heavier than 5 lb (2.3 kg), or the limit that you are told, on the day  of your procedure.  Do not do anything strenuous with your arm for the rest of the day. Avoid household activities, such as vacuuming.  Return to your normal activities as told by your health care provider. Ask your health care provider what activities are safe for you. Safety To prevent damage to your graft or fistula:  Do not wear tight-fitting clothing or jewelry on the arm or leg that has your graft or fistula.  Tell all your health care providers that you have a dialysis fistula or graft.  Do not allow blood draws, IVs, or blood pressure readings to be done in the arm that has your fistula or graft.  Do not allow flu shots or vaccinations in the arm with your fistula or graft. General instructions  Take  over-the-counter and prescription medicines only as told by your health care provider.  Do not take baths, swim, or use a hot tub until your health care provider approves. Ask your health care provider if you may take showers. You may only be allowed to take sponge baths.  Monitor your dialysis fistula closely. Check to make sure that you can feel a vibration or buzz (a thrill) when you put your fingers over the fistula.  Keep all follow-up visits. This is important. Contact a health care provider if:  You have more redness, swelling, or pain at the site where the catheter was put in.  You have fluid or blood coming from the catheter site.  You have pus or a bad smell coming from the catheter site.  Your catheter site feels warm.  You have a fever or chills. Get help right away if:  You have bleeding from the vascular access site that does not stop.  You feel weak.  You have trouble balancing.  You have trouble moving your arms or legs.  You have problems with your speech or vision.  You can no longer feel a vibration or buzz when you put your fingers over your fistula.  The limb that was used for the procedure swells or becomes painful, cold, blue, or pale white.  You have chest pain or shortness of breath. These symptoms may represent a serious problem that is an emergency. Do not wait to see if the symptoms will go away. Get medical help right away. Call your local emergency services (911 in the [place] Do not drive yourself to the hospital. Summary  After a dialysis fistulogram, it is common to have a small amount of discomfort or bruising in the area where the small, thin tube (catheter) was placed.  Rest as much as you can after your procedure. Return to your normal activities as told by your health care provider.  Take over-the-counter and prescription medicines only as told by your health care provider.  Follow instructions from your health care provider about how  to take care of the site where the catheter was inserted.  Keep all follow-up visits. This is important. This information is not intended to replace advice given to you by your health care provider. Make sure you discuss any questions you have with your health care provider. Document Revised: 01/28/2020 Document Reviewed: 01/28/2020 Elsevier Patient Education  [place].

## 2020-09-24 NOTE — Procedures (Signed)
Interventional Radiology Procedure Note  Procedure: AV fistulogram; right cephalic vein angioplasty  Complications: None  Estimated Blood Loss: < 10 mL  Findings: Multiple high grade stenoses of right cephalic vein in upper arm. Anastomosis between brachial artery and cephalic vein widely patent. Central veins widely patent.   Right cephalic vein treated with 6 mm balloon angioplasty with improved patency and flow.  Venetia Night. Kathlene Cote, M.D Pager:  [phone]

## 2020-09-24 NOTE — H&P (Signed)
Chief Complaint: Patient was seen in consultation today for renal failure  Referring Physician(s): [place]  Supervising Physician: Aletta Edouard  Patient Status: [facility] - Out-pt  History of Present Illness: Sarah Kelley is a 70 y.o. female with past medical history of DM, HTN, ESRD on dialysis via LUA fistula which has been previously declotted/angioplastied several times in IR since 2018.  Most recently, she underwent left upper arm brachiocephalic AV fistula thrombolsis, angioplasty of the venous limb 07/16/20 by Dr. Pascal Lux.  Per patient's son she went to HD yesterday and was able to undergo full session, however experienced prolonged bleeding after cathter removal and was referred to IR today for assessment.  Patient has been NPO.  She does not take blood thinners.   She is accompanied by family and interpreter today.  Denies new concerns.  In her usual state of health. She is scheduled for regular HD session tomorrow.   Past Medical History:  Diagnosis Date  . Diabetes mellitus   . ESRD (end stage renal disease) ([place])    dialysis  . Hyperlipidemia   . Hypertension   . Iron deficiency anemia   . MRSA infection    hx of  . Neuropathy    Diabetic  . Pneumonia 07/2016  . Renal disorder   . Seizure-like activity ([place])   . Sepsis ([place])   . Thyroid disease   . Wears dentures     Past Surgical History:  Procedure Laterality Date  . AMPUTATION Left 05/21/2015   Procedure: Left Foot 3rd Ray Amputation;  Surgeon: Newt Minion, MD;  Location: [place];  Service: Orthopedics;  Laterality: Left;  . AV FISTULA PLACEMENT Left 03/28/2016   Procedure: ARTERIOVENOUS (AV) FISTULA CREATION LEFT ARM;  Surgeon: Waynetta Sandy, MD;  Location: [place];  Service: Vascular;  Laterality: Left;  . AV FISTULA PLACEMENT Right 06/04/2019   Procedure: ARTERIOVENOUS (AV) FISTULA CREATION RIGHT ARM;  Surgeon: Waynetta Sandy, MD;  Location: [place];  Service:  Vascular;  Laterality: Right;  . DILATION AND CURETTAGE OF UTERUS    . EYE SURGERY    . FISTULA SUPERFICIALIZATION Left 09/04/2016   Procedure: FISTULA SUPERFICIALIZATION LEFT UPPER ARM;  Surgeon: Waynetta Sandy, MD;  Location: [place];  Service: Vascular;  Laterality: Left;  . FISTULA SUPERFICIALIZATION Right 10/08/2019   Procedure: FISTULA SUPERFICIALIZATION OR RIGHT ARM;  Surgeon: Waynetta Sandy, MD;  Location: [place];  Service: Vascular;  Laterality: Right;  . IR AV DIALY SHUNT INTRO NEEDLE/INTRAC INITIAL W/PTA/STENT/IMG LT Left 06/13/2017  . IR AV DIALY SHUNT INTRO NEEDLE/INTRAC INITIAL W/PTA/STENT/IMG LT Left 09/12/2017  . IR AV DIALY SHUNT INTRO NEEDLE/INTRACATH INITIAL W/PTA/IMG LEFT  11/10/2016  . IR AV DIALY SHUNT INTRO NEEDLE/INTRACATH INITIAL W/PTA/IMG LEFT  03/09/2017  . IR AV DIALY SHUNT INTRO NEEDLE/INTRACATH INITIAL W/PTA/IMG LEFT  12/12/2017  . IR AV DIALY SHUNT INTRO NEEDLE/INTRACATH INITIAL W/PTA/IMG LEFT  02/27/2018  . IR AV DIALY SHUNT INTRO NEEDLE/INTRACATH INITIAL W/PTA/IMG LEFT  06/05/2018  . IR AV DIALY SHUNT INTRO NEEDLE/INTRACATH INITIAL W/PTA/IMG LEFT  09/11/2018  . IR AV DIALY SHUNT INTRO NEEDLE/INTRACATH INITIAL W/PTA/IMG RIGHT Right 04/21/2020  . IR DIALY SHUNT INTRO NEEDLE/INTRACATH INITIAL W/IMG LEFT Left 05/08/2018  . IR FLUORO GUIDE CV LINE RIGHT  02/01/2019  . IR FLUORO GUIDE CV LINE RIGHT  07/14/2020  . IR GENERIC HISTORICAL  08/01/2016   IR FLUORO GUIDE CV LINE RIGHT 08/01/2016 [name], MD [facility]-INTERV RAD  . IR GENERIC HISTORICAL  08/01/2016   IR  US GUIDE VASC ACCESS RIGHT 08/01/2016 [name], MD [facility]INTER[facility]  . IR REMOVAL TUN CV CATH W/O FL  11/01/2016  . IR REMOVAL TUN CV CATH W/O FL  01/02/2020  . IR THROMBECTOMY AV FISTULA W/THROMBOLYSIS INC/SHUNT/IMG LEFT Left 01/31/2019  . IR THROMBECTOMY AV FISTULA W/THROMBOLYSIS/PTA INC/SHUNT/IMG LEFT Left 01/27/2019  . IR THROMBECTOMY AV FISTULA W/THROMBOLYSIS/PTA INC/SHUNT/IMG LEFT Left 07/16/2020  . IR US GUIDE  VASC ACCESS LEFT  11/10/2016  . IR US GUIDE VASC ACCESS LEFT  09/12/2017  . IR US GUIDE VASC ACCESS LEFT  12/12/2017  . IR US GUIDE VASC ACCESS LEFT  06/05/2018  . IR US GUIDE VASC ACCESS LEFT  09/11/2018  . IR US GUIDE VASC ACCESS LEFT  01/27/2019  . IR US GUIDE VASC ACCESS LEFT  02/01/2019  . IR US GUIDE VASC ACCESS RIGHT  02/01/2019  . IR US GUIDE VASC ACCESS RIGHT  07/14/2020  . IR US GUIDE VASC ACCESS RIGHT  07/16/2020    Allergies: Patient has no known allergies.  Medications: Prior to Admission medications   Medication Sig Start Date End Date Taking? Authorizing Provider  atorvastatin (LIPITOR) 40 MG tablet Take 1 tablet (40 mg total) by mouth daily. 01/24/19  Yes [name], MD  cetirizine (ZYRTEC) 10 MG tablet Take 1 tablet (10 mg total) by mouth daily. 01/24/19  Yes Newlin, Charlane Ferretti, MD  DULoxetine (CYMBALTA) 60 MG capsule TAKE 1 CAPSULE (60 MG TOTAL) BY MOUTH DAILY. 12/05/18  Yes [name], MD  insulin glargine (LANTUS) 100 UNIT/ML injection Inject subcutaneously twice daily 20 units in the morning and 15 units in the evening 07/25/18  Yes McClung, Angela M, PA-C  lisinopril (PRINIVIL,ZESTRIL) 20 MG tablet Take 1 tablet (20 mg total) by mouth daily. 10/16/18  Yes [name], MD  Amino Acids-Protein Hydrolys (FEEDING SUPPLEMENT, PRO-STAT SUGAR FREE 64,) LIQD Take 30 mLs by mouth 2 (two) times daily. 02/17/18   Elgergawy, Silver Huguenin, MD  bacitracin 500 UNIT/GM ointment Apply 1 application topically 2 (two) times daily. 03/27/18   [name], MD  Blood Glucose Monitoring Suppl (TRUE METRIX METER) DEVI 1 each by Does not apply route 3 (three) times daily before meals. 07/25/17   [name], MD  cephALEXin (KEFLEX) 500 MG capsule TAKE 1 CAPSULE (500 MG TOTAL) BY MOUTH DAILY. GIVE AFTER HEMODIALYSIS ON DIALYSIS DAYS. 10/15/18   Ladell Pier, MD  gabapentin (NEURONTIN) 300 MG capsule Take 1 capsule (300 mg total) by mouth at bedtime. Patient not taking: Reported on 09/09/2018  02/17/18   Elgergawy, Silver Huguenin, MD  gabapentin (NEURONTIN) 300 MG capsule TAKE 2 CAPSULES (600 MG TOTAL) BY MOUTH AT BEDTIME. 10/15/18   Ladell Pier, MD  glucose blood (TRUE METRIX BLOOD GLUCOSE TEST) test strip Use 3 times daily before meals 07/25/17   [name], MD  TRUEPLUS INSULIN SYRINGE 31G X 5/16" 1 ML MISC USE AS DIRECTED 07/22/18   [name], MD  TRUEPLUS LANCETS 28G MISC 1 each by Does not apply route 3 (three) times daily before meals. 07/25/17   [name], MD     History reviewed. No pertinent family history.  Social History   Socioeconomic History  . Marital status: Widowed    Spouse name: Not on file  . Number of children: 11  . Years of education: no schooling  . Highest education level: Not on file  Occupational History  . Not on file  Tobacco Use  . Smoking status: Never Smoker  . Smokeless tobacco: Never Used  Vaping Use  . Vaping Use: Never used  Substance and Sexual Activity  . Alcohol use: Not Currently    Alcohol/week: 0.0 standard drinks    Comment: rare- a beer every now and then  . Drug use: No  . Sexual activity: Not Currently  Other Topics Concern  . Not on file  Social History Narrative   Lives with son Sarah Kelley    Right handed   Caffeine: every once in a while a little coffee, chamomile tea, or soda   Social Determinants of Health   Financial Resource Strain: Not on file  Food Insecurity: Not on file  Transportation Needs: Not on file  Physical Activity: Not on file  Stress: Not on file  Social Connections: Not on file     Review of Systems: A 12 point ROS discussed and pertinent positives are indicated in the HPI above.  All other systems are negative.  Review of Systems  Constitutional: Negative for fatigue and fever.  Respiratory: Negative for cough and shortness of breath.   Cardiovascular: Negative for chest pain.  Gastrointestinal: Negative for abdominal pain.  Musculoskeletal: Negative for back pain.   Psychiatric/Behavioral: Negative for behavioral problems and confusion.    Vital Signs: BP (!) 167/63   Pulse (!) 58   Temp 97.8 F (36.6 C)   Wt 133 lb 6.1 oz (60.5 kg)   LMP  (LMP Unknown)   SpO2 99%   BMI 26.05 kg/m   Physical Exam Vitals and nursing note reviewed.  Constitutional:      Appearance: Normal appearance.  HENT:     Mouth/Throat:     Mouth: Mucous membranes are moist.     Pharynx: Oropharynx is clear.  Cardiovascular:     Rate and Rhythm: Normal rate and regular rhythm.  Pulmonary:     Effort: Pulmonary effort is normal.     Breath sounds: Normal breath sounds.  Musculoskeletal:     Comments: LUA fistula present. Distal pulse, No thrill.   Skin:    General: Skin is warm and dry.  Neurological:     General: No focal deficit present.     Mental Status: She is alert and oriented to person, place, and time. Mental status is at baseline.  Psychiatric:        Mood and Affect: Mood normal.        Behavior: Behavior normal.        Thought Content: Thought content normal.        Judgment: Judgment normal.      MD Evaluation Airway: WNL Heart: WNL Abdomen: WNL Chest/ Lungs: WNL ASA  Classification: 3 Mallampati/Airway Score: Two   Imaging: No results found.  Labs:  CBC: Recent Labs    07/15/20 0332 07/16/20 0300 07/17/20 0332 07/18/20 0705  WBC 5.3 6.3 5.3 4.3  HGB 10.0* 10.8* 10.2* 9.3*  HCT 31.9* 35.3* 33.3* 29.2*  PLT 180 175 145* 145*    COAGS: Recent Labs    07/16/20 0300  INR 1.2    BMP: Recent Labs    06/11/20 1013 07/14/20 1210 07/16/20 0300 07/16/20 1000 07/17/20 0332 07/17/20 0818 07/17/20 1223 07/17/20 1912 07/18/20 0705 09/24/20 1138  NA 139   < > 137  --  135  --   --   --  138 135  K 4.0   < > 5.7*   < > 3.3*   < > 3.8 4.0 4.0 6.5*  CL 96   < > 98  --  94*  --   --   --  98 99  CO2 27   < > 25  --  27  --   --   --  28 28  GLUCOSE 50*   < > 172*  --  116*  --   --   --  146* 57*  BUN 30*   < > 50*  --   20  --   --   --  26* 78*  CALCIUM 9.9   < > 9.4  --  8.5*  --   --   --  8.5* 9.6  CREATININE 5.53*   < > 10.47*  --  4.56*  --   --   --  7.10* 9.60*  GFRNONAA 7*   < > 4*  --  10*  --   --   --  6* 4*  GFRAA 8*  --   --   --   --   --   --   --   --   --    < > = values in this interval not displayed.    LIVER FUNCTION TESTS: Recent Labs    06/11/20 1013 07/14/20 2122 07/15/20 0427  BILITOT 0.3  --   --   AST 17  --   --   ALT 15  --   --   ALKPHOS 177*  --   --   PROT 6.7  --   --   ALBUMIN 3.6* 2.7* 2.7*    TUMOR MARKERS: No results for input(s): AFPTM, CEA, CA199, CHROMGRNA in the last 8760 hours.  Assessment and Plan: Patient with past medical history of ESRD on HD via LUA fistula presents with complaint of prolonged bleeding after HD session yesterday.  IR consulted for fistulagram, possible intervention. Case reviewed by Dr. Kathlene Cote who approves patient for procedure.  Patient presents today in their usual state of health.  She has been NPO and is not currently on blood thinners. Her son provides history and assists with consent.  Interpreter present during visit.  Potassium elevated at 6.5 today.  CGB 52.  Patient given D5 in short stay.   Risks and benefits discussed including, but not limited to bleeding, infection, vascular injury, pulmonary embolism, need for tunneled HD catheter placement or even death.  All of the patient's questions were answered, patient and family are agreeable to proceed. Consent signed and in chart.  Thank you for this interesting consult.  I greatly enjoyed meeting [facility] and look forward to participating in their care.  A copy of this report was sent to the requesting provider on this date.  Electronically Signed: Docia Barrier, PA 09/24/2020, 1:46 PM   I spent a total of    25 Minutes in face to face in clinical consultation, greater than 50% of which was counseling/coordinating care for renal failure.

## 2020-09-24 NOTE — Sedation Documentation (Signed)
Dr. Kathlene Cote ok with pt being discharged as ordered.

## 2020-09-27 ENCOUNTER — Ambulatory Visit: Payer: Self-pay | Admitting: Diagnostic Neuroimaging

## 2020-10-02 ENCOUNTER — Other Ambulatory Visit: Payer: Self-pay

## 2020-10-05 ENCOUNTER — Telehealth: Payer: Self-pay | Admitting: *Deleted

## 2020-10-05 ENCOUNTER — Ambulatory Visit: Payer: Self-pay | Admitting: Diagnostic Neuroimaging

## 2020-10-05 ENCOUNTER — Other Ambulatory Visit: Payer: Self-pay

## 2020-10-05 ENCOUNTER — Encounter: Payer: Self-pay | Admitting: Diagnostic Neuroimaging

## 2020-10-05 ENCOUNTER — Other Ambulatory Visit (HOSPITAL_COMMUNITY): Payer: Self-pay | Admitting: Nephrology

## 2020-10-05 DIAGNOSIS — Z992 Dependence on renal dialysis: Secondary | ICD-10-CM

## 2020-10-05 DIAGNOSIS — N186 End stage renal disease: Secondary | ICD-10-CM

## 2020-10-05 MED FILL — Ethyl Chloride Aerosol Spray: CUTANEOUS | 25 days supply | Qty: 116 | Fill #0 | Status: AC

## 2020-10-05 NOTE — Telephone Encounter (Signed)
Patient was no show for follow up appointment today.  

## 2020-10-06 ENCOUNTER — Other Ambulatory Visit: Payer: Self-pay | Admitting: Radiology

## 2020-10-07 ENCOUNTER — Other Ambulatory Visit: Payer: Self-pay | Admitting: Radiology

## 2020-10-08 ENCOUNTER — Other Ambulatory Visit (HOSPITAL_COMMUNITY): Payer: Self-pay | Admitting: Nephrology

## 2020-10-08 ENCOUNTER — Other Ambulatory Visit: Payer: Self-pay

## 2020-10-08 ENCOUNTER — Encounter (HOSPITAL_COMMUNITY): Payer: Self-pay

## 2020-10-08 ENCOUNTER — Ambulatory Visit (HOSPITAL_COMMUNITY)
Admission: RE | Admit: 2020-10-08 | Discharge: 2020-10-08 | Disposition: A | Discharge status: Home / Self Care | Payer: Self-pay | Source: Ambulatory Visit | Attending: Nephrology | Admitting: Nephrology

## 2020-10-08 DIAGNOSIS — N186 End stage renal disease: Secondary | ICD-10-CM

## 2020-10-08 DIAGNOSIS — E1122 Type 2 diabetes mellitus with diabetic chronic kidney disease: Secondary | ICD-10-CM | POA: Insufficient documentation

## 2020-10-08 DIAGNOSIS — T82858A Stenosis of vascular prosthetic devices, implants and grafts, initial encounter: Secondary | ICD-10-CM | POA: Insufficient documentation

## 2020-10-08 DIAGNOSIS — I120 Hypertensive chronic kidney disease with stage 5 chronic kidney disease or end stage renal disease: Secondary | ICD-10-CM | POA: Insufficient documentation

## 2020-10-08 DIAGNOSIS — Z79899 Other long term (current) drug therapy: Secondary | ICD-10-CM | POA: Insufficient documentation

## 2020-10-08 DIAGNOSIS — Z992 Dependence on renal dialysis: Secondary | ICD-10-CM

## 2020-10-08 DIAGNOSIS — Z794 Long term (current) use of insulin: Secondary | ICD-10-CM | POA: Insufficient documentation

## 2020-10-08 DIAGNOSIS — Y841 Kidney dialysis as the cause of abnormal reaction of the patient, or of later complication, without mention of misadventure at the time of the procedure: Secondary | ICD-10-CM | POA: Insufficient documentation

## 2020-10-08 HISTORY — PX: IR AV DIALY SHUNT INTRO NEEDLE/INTRACATH INITIAL W/PTA/IMG RIGHT: IMG6116

## 2020-10-08 HISTORY — PX: IR US GUIDE VASC ACCESS RIGHT: IMG2390

## 2020-10-08 LAB — BASIC METABOLIC PANEL
Anion gap: 7 (ref 5–15)
BUN: 25 mg/dL — ABNORMAL HIGH (ref 8–23)
CO2: 33 mmol/L — ABNORMAL HIGH (ref 22–32)
Calcium: 9.5 mg/dL (ref 8.9–10.3)
Chloride: 99 mmol/L (ref 98–111)
Creatinine, Ser: 5.54 mg/dL — ABNORMAL HIGH (ref 0.44–1.00)
GFR, Estimated: 8 mL/min — ABNORMAL LOW (ref >60)
Glucose, Bld: 75 mg/dL (ref 70–99)
Potassium: 4.1 mmol/L (ref 3.5–5.1)
Sodium: 139 mmol/L (ref 135–145)

## 2020-10-08 LAB — CBC WITH DIFFERENTIAL/PLATELET
Abs Immature Granulocytes: 0.02 10*3/uL (ref 0.00–0.07)
Basophils Absolute: 0 10*3/uL (ref 0.0–0.1)
Basophils Relative: 1 %
Eosinophils Absolute: 0.2 10*3/uL (ref 0.0–0.5)
Eosinophils Relative: 3 %
HCT: 35.4 % — ABNORMAL LOW (ref 36.0–46.0)
Hemoglobin: 11.2 g/dL — ABNORMAL LOW (ref 12.0–15.0)
Immature Granulocytes: 0 %
Lymphocytes Relative: 36 %
Lymphs Abs: 1.6 10*3/uL (ref 0.7–4.0)
MCH: 30.5 pg (ref 26.0–34.0)
MCHC: 31.6 g/dL (ref 30.0–36.0)
MCV: 96.5 fL (ref 80.0–100.0)
Monocytes Absolute: 0.5 10*3/uL (ref 0.1–1.0)
Monocytes Relative: 11 %
Neutro Abs: 2.2 10*3/uL (ref 1.7–7.7)
Neutrophils Relative %: 49 %
Platelets: 153 10*3/uL (ref 150–400)
RBC: 3.67 MIL/uL — ABNORMAL LOW (ref 3.87–5.11)
RDW: 15 % (ref 11.5–15.5)
WBC: 4.5 10*3/uL (ref 4.0–10.5)
nRBC: 0 % (ref 0.0–0.2)

## 2020-10-08 LAB — GLUCOSE, CAPILLARY
Glucose-Capillary: 73 mg/dL (ref 70–99)
Glucose-Capillary: 80 mg/dL (ref 70–99)

## 2020-10-08 LAB — PROTIME-INR
INR: 1.1 (ref 0.8–1.2)
Prothrombin Time: 14.2 seconds (ref 11.4–15.2)

## 2020-10-08 MED ORDER — MIDAZOLAM HCL 2 MG/2ML IJ SOLN
INTRAMUSCULAR | Status: AC
  Filled 2020-10-08: qty 2

## 2020-10-08 MED ORDER — SODIUM CHLORIDE 0.9 % IV SOLN: INTRAVENOUS | Status: DC

## 2020-10-08 MED ORDER — FENTANYL CITRATE (PF) 100 MCG/2ML IJ SOLN
INTRAMUSCULAR | Status: AC
  Filled 2020-10-08: qty 2

## 2020-10-08 MED ORDER — LIDOCAINE HCL 1 % IJ SOLN
INTRAMUSCULAR | Status: AC
  Filled 2020-10-08: qty 20

## 2020-10-08 MED ORDER — IOHEXOL 300 MG/ML  SOLN
100.0000 mL | Freq: Once | INTRAMUSCULAR | Status: AC | PRN
  Administered 2020-10-08: 84 mL via INTRAVENOUS

## 2020-10-08 MED ORDER — HEPARIN SODIUM (PORCINE) 1000 UNIT/ML IJ SOLN
INTRAMUSCULAR | Status: AC
  Filled 2020-10-08: qty 1

## 2020-10-08 MED ORDER — FENTANYL CITRATE (PF) 100 MCG/2ML IJ SOLN
INTRAMUSCULAR | Status: AC | PRN
  Administered 2020-10-08: 50 ug via INTRAVENOUS

## 2020-10-08 MED ORDER — LIDOCAINE HCL 1 % IJ SOLN
INTRAMUSCULAR | Status: AC | PRN
  Administered 2020-10-08: 10 mL

## 2020-10-08 MED ORDER — MIDAZOLAM HCL 2 MG/2ML IJ SOLN
INTRAMUSCULAR | Status: AC | PRN
  Administered 2020-10-08: 1 mg via INTRAVENOUS

## 2020-10-08 MED ORDER — HEPARIN SODIUM (PORCINE) 1000 UNIT/ML IJ SOLN
INTRAMUSCULAR | Status: AC | PRN
  Administered 2020-10-08: 4000 [IU] via INTRAVENOUS

## 2020-10-08 MED ORDER — SODIUM CHLORIDE 0.9 % IV SOLN
INTRAVENOUS | Status: AC | PRN
  Administered 2020-10-08: 10 mL/h via INTRAVENOUS

## 2020-10-08 NOTE — Discharge Instructions (Signed)
Fistulografa de dilisis, cuidados posteriores Dialysis Fistulogram, Care After La siguiente informacin ofrece orientacin sobre cmo cuidarse despus del procedimiento. El mdico tambin podr darle instrucciones ms especficas. Comunquese con el mdico si tiene problemas o preguntas. Qu puedo esperar despus del procedimiento? Despus del procedimiento, es normal tener los siguientes sntomas:  Una pequea molestia en la zona en la que se coloc el tubo pequeo (catter) para el procedimiento.  Un pequeo hematoma alrededor de la fstula.  Somnolencia y cansancio Company secretary). Siga estas instrucciones en su casa: Cuidado del lugar de la puncin  Siga las instrucciones de su mdico acerca de cmo cuidar el lugar donde se insertaron los catteres. Asegrese de hacer lo siguiente: ? Lvese las manos con agua y jabn durante al menos 20segundos antes y despus de cambiarse la venda (vendaje). Use desinfectante para manos si no dispone de [place]. ? Cambie el vendaje como se lo haya indicado el mdico. ? No retire los puntos (suturas), la goma para cerrar la piel o las tiras [place]. Es posible que estos cierres cutneos deban quedar puestos en la piel durante 2semanas o ms tiempo. Si los bordes de las tiras adhesivas empiezan a despegarse y Therapist, sports, puede recortar los que estn sueltos. No retire las tiras [facility] por completo a menos que el mdico se lo indique.  Controle la zona de la puncin todos los das para detectar signos de infeccin. Est atento a los siguientes signos: ? Aumento del enrojecimiento, la hinchazn o Conservation officer, historic buildings. ? Lquido o sangre. ? Calor. ? Pus o mal olor.   Actividad  Descanse todo lo posible.  Si le administraron un sedante durante el procedimiento, puede afectarlo por varias horas. No conduzca ni opere maquinaria hasta que el mdico le indique que es seguro [place].  No levante ningn objeto que pese ms de 5libras (2.3kg) o el lmite de peso  que le hayan indicado el da del procedimiento.  No haga nada extenuante con el brazo durante el resto del da. Evite las [employer], como pasar la aspiradora.  Retome sus actividades normales segn lo indicado por el mdico. Pregntele al mdico qu actividades son seguras para usted. Seguridad Para evitar daar el injerto o la fstula:  No use ropa ajustada ni joyas en el brazo o la pierna que tiene el injerto o la fstula.  Informe a todos sus mdicos que tiene un injerto o una fstula para dilisis.  No permita extracciones de sangre, terapias intravenosas ni lecturas de la presin arterial en el brazo que tiene la fstula o el injerto.  No permita que le apliquen vacunas antigripales ni otras vacunas en el brazo con la fstula o el injerto. Instrucciones generales  Use los medicamentos de venta libre y los recetados solamente como se lo haya indicado el mdico.  No tome baos de inmersin, no nade ni use el jacuzzi hasta que el mdico lo autorice. Pregntele al mdico si puede ducharse. Thurston Pounds solo le permitan darse baos de [place].  Controle atentamente la fstula de dilisis. Verifique para asegurarse de que puede sentir una vibracin o un zumbido (frmito) cuando Risk manager los dedos sobre la fstula.  Cumpla con todas las visitas de seguimiento. Esto es importante. Comunquese con un mdico si:  Tiene ms enrojecimiento, hinchazn o dolor en el lugar donde se insert el catter.  Observa lquido o sangre que proviene del lugar de la insercin del catter.  Tiene pus o percibe mal olor que proviene del lugar de la insercin del catter.  El lugar del catter est caliente al tacto.  Tiene fiebre o escalofros. Solicite ayuda de inmediato si:  Presenta un sangrado en el lugar del acceso vascular, que no se detiene.  Se siente dbil.  Tiene problemas de equilibrio.  Tiene dificultad para mover los brazos o las piernas.  Tiene problemas visuales o para  hablar.  Ya no puede sentir una vibracin o un zumbido cuando [employer] dedos [employer] fstula.  El miembro que se utiliz para el procedimiento se hincha o se torna doloroso, fro, azul o blanco plido.  Siente falta de aire o Tourist information centre manager. Estos sntomas pueden representar un problema grave que constituye Engineer, maintenance (IT). No espere a ver si los sntomas desaparecen. Solicite atencin mdica de inmediato. Comunquese con el servicio de emergencias de su localidad (911 en los [place]). No conduzca por sus propios medios [employer]. Resumen  Despus de una fistulografa de dilisis, es comn tener una pequea molestia o algunos moretones en la zona donde se coloc el tubo delgado y pequeo (catter).  Descanse todo lo que pueda despus del procedimiento. Retome sus actividades normales segn lo indicado por el mdico.  Use los medicamentos de venta libre y los recetados solamente como se lo haya indicado el mdico.  Siga las instrucciones de su mdico acerca de cmo Government social research officer donde se insert el catter.  Cumpla con todas las visitas de seguimiento. Esto es importante. Esta informacin no tiene [name] el consejo del mdico. Asegrese de hacerle al mdico cualquier pregunta que tenga. Document Revised: 05/05/2020 Document Reviewed: 05/05/2020 Elsevier Patient Education  2021 [name].

## 2020-10-08 NOTE — Procedures (Signed)
Interventional Radiology Procedure Note  Procedure:    US guided right UE fistula access x 2.  Angioplasty of venous outflow stenosis up to 34m. Angioplasty of AV connection stenosis up to 576m Findings: Small fistula throughout.  Narrowed outflow of ~60% in 2 location. Narrowed AV connection of ~85%.   Less than 20% residual narrowing at all locations at completion. Improved flow with excellent thrill.    Complications: None  Recommendations:  - Ok to use - Do not submerge for 7 days - Routine care - Recommend duplex evaluation to evaluate flow rate and any site of occult narrowing.    Signed,  JaDulcy FannyWaEarleen NewportDO

## 2020-10-08 NOTE — Sedation Documentation (Signed)
Attempt made x 1 to call report to Short Stay unit. Per Caryl Pina, no RN available at this time to receive report. Will call back when RN is available.

## 2020-10-08 NOTE — H&P (Signed)
Chief Complaint: Patient was seen in consultation today for renal failure  Referring Physician(s): Granjeno  Supervising Physician: Aletta Edouard  Patient Status: [facility] - Out-pt  History of Present Illness: Sarah Kelley is a 70 y.o. female with past medical history of DM, HTN, ESRD on dialysis via LUA fistula which has been previously declotted/angioplastied several times in IR since 2018.  Most recently, she underwent left upper arm brachiocephalic AV fistula thrombolsis, angioplasty of the cephalic vein [id number] by Dr. Kathlene Cote.  Per patient's son she went to HD yesterday and was able to undergo full session, however with difficulty due to clotted access.  Patient has been NPO.  She does not take blood thinners.   She is accompanied by family and interpreter today.  Denies new concerns.  In her usual state of health. She is scheduled for regular HD session tomorrow.   Past Medical History:  Diagnosis Date  . Diabetes mellitus   . ESRD (end stage renal disease) ([place])    dialysis  . Hyperlipidemia   . Hypertension   . Iron deficiency anemia   . MRSA infection    hx of  . Neuropathy    Diabetic  . Pneumonia 07/2016  . Renal disorder   . Seizure-like activity ([place])   . Sepsis ([place])   . Thyroid disease   . Wears dentures     Past Surgical History:  Procedure Laterality Date  . AMPUTATION Left 05/21/2015   Procedure: Left Foot 3rd Ray Amputation;  Surgeon: Newt Minion, MD;  Location: [place];  Service: Orthopedics;  Laterality: Left;  . AV FISTULA PLACEMENT Left 03/28/2016   Procedure: ARTERIOVENOUS (AV) FISTULA CREATION LEFT ARM;  Surgeon: Waynetta Sandy, MD;  Location: [place];  Service: Vascular;  Laterality: Left;  . AV FISTULA PLACEMENT Right 06/04/2019   Procedure: ARTERIOVENOUS (AV) FISTULA CREATION RIGHT ARM;  Surgeon: Waynetta Sandy, MD;  Location: [place];  Service: Vascular;  Laterality: Right;  . DILATION AND CURETTAGE OF  UTERUS    . EYE SURGERY    . FISTULA SUPERFICIALIZATION Left 09/04/2016   Procedure: FISTULA SUPERFICIALIZATION LEFT UPPER ARM;  Surgeon: Waynetta Sandy, MD;  Location: [place];  Service: Vascular;  Laterality: Left;  . FISTULA SUPERFICIALIZATION Right 10/08/2019   Procedure: FISTULA SUPERFICIALIZATION OR RIGHT ARM;  Surgeon: Waynetta Sandy, MD;  Location: [place];  Service: Vascular;  Laterality: Right;  . IR AV DIALY SHUNT INTRO NEEDLE/INTRAC INITIAL W/PTA/STENT/IMG LT Left 06/13/2017  . IR AV DIALY SHUNT INTRO NEEDLE/INTRAC INITIAL W/PTA/STENT/IMG LT Left 09/12/2017  . IR AV DIALY SHUNT INTRO NEEDLE/INTRACATH INITIAL W/PTA/IMG LEFT  11/10/2016  . IR AV DIALY SHUNT INTRO NEEDLE/INTRACATH INITIAL W/PTA/IMG LEFT  03/09/2017  . IR AV DIALY SHUNT INTRO NEEDLE/INTRACATH INITIAL W/PTA/IMG LEFT  12/12/2017  . IR AV DIALY SHUNT INTRO NEEDLE/INTRACATH INITIAL W/PTA/IMG LEFT  02/27/2018  . IR AV DIALY SHUNT INTRO NEEDLE/INTRACATH INITIAL W/PTA/IMG LEFT  06/05/2018  . IR AV DIALY SHUNT INTRO NEEDLE/INTRACATH INITIAL W/PTA/IMG LEFT  09/11/2018  . IR AV DIALY SHUNT INTRO NEEDLE/INTRACATH INITIAL W/PTA/IMG RIGHT Right 04/21/2020  . IR DIALY SHUNT INTRO NEEDLE/INTRACATH INITIAL W/IMG LEFT Left 05/08/2018  . IR FLUORO GUIDE CV LINE RIGHT  02/01/2019  . IR FLUORO GUIDE CV LINE RIGHT  07/14/2020  . IR GENERIC HISTORICAL  08/01/2016   IR FLUORO GUIDE CV LINE RIGHT 08/01/2016 [name], MD [facility]-INTERV RAD  . IR GENERIC HISTORICAL  08/01/2016   IR US GUIDE VASC ACCESS RIGHT 08/01/2016 [name],  MD [facility]-INTER[facility] RAD  . IR REMOVAL TUN CV CATH W/O FL  11/01/2016  . IR REMOVAL TUN CV CATH W/O FL  01/02/2020  . IR THROMBECTOMY AV FISTULA W/THROMBOLYSIS INC/SHUNT/IMG LEFT Left 01/31/2019  . IR THROMBECTOMY AV FISTULA W/THROMBOLYSIS/PTA INC/SHUNT/IMG LEFT Left 01/27/2019  . IR THROMBECTOMY AV FISTULA W/THROMBOLYSIS/PTA INC/SHUNT/IMG LEFT Left 07/16/2020  . IR THROMBECTOMY AV FISTULA W/THROMBOLYSIS/PTA/STENT INC/SHUNT/IMG RT  Right 09/24/2020  . IR US GUIDE VASC ACCESS LEFT  11/10/2016  . IR US GUIDE VASC ACCESS LEFT  09/12/2017  . IR US GUIDE VASC ACCESS LEFT  12/12/2017  . IR US GUIDE VASC ACCESS LEFT  06/05/2018  . IR US GUIDE VASC ACCESS LEFT  09/11/2018  . IR US GUIDE VASC ACCESS LEFT  01/27/2019  . IR US GUIDE VASC ACCESS LEFT  02/01/2019  . IR US GUIDE VASC ACCESS RIGHT  02/01/2019  . IR US GUIDE VASC ACCESS RIGHT  07/14/2020  . IR US GUIDE VASC ACCESS RIGHT  07/16/2020    Allergies: Patient has no known allergies.  Medications: Prior to Admission medications   Medication Sig Start Date End Date Taking? Authorizing Provider  atorvastatin (LIPITOR) 40 MG tablet Take 1 tablet (40 mg total) by mouth daily. 01/24/19  Yes [name], MD  cetirizine (ZYRTEC) 10 MG tablet Take 1 tablet (10 mg total) by mouth daily. 01/24/19  Yes Newlin, Charlane Ferretti, MD  DULoxetine (CYMBALTA) 60 MG capsule TAKE 1 CAPSULE (60 MG TOTAL) BY MOUTH DAILY. 12/05/18  Yes [name], MD  insulin glargine (LANTUS) 100 UNIT/ML injection Inject subcutaneously twice daily 20 units in the morning and 15 units in the evening 07/25/18  Yes McClung, Angela M, PA-C  lisinopril (PRINIVIL,ZESTRIL) 20 MG tablet Take 1 tablet (20 mg total) by mouth daily. 10/16/18  Yes [name], MD  Amino Acids-Protein Hydrolys (FEEDING SUPPLEMENT, PRO-STAT SUGAR FREE 64,) LIQD Take 30 mLs by mouth 2 (two) times daily. 02/17/18   Elgergawy, Silver Huguenin, MD  bacitracin 500 UNIT/GM ointment Apply 1 application topically 2 (two) times daily. 03/27/18   [name], MD  Blood Glucose Monitoring Suppl (TRUE METRIX METER) DEVI 1 each by Does not apply route 3 (three) times daily before meals. 07/25/17   [name], MD  cephALEXin (KEFLEX) 500 MG capsule TAKE 1 CAPSULE (500 MG TOTAL) BY MOUTH DAILY. GIVE AFTER HEMODIALYSIS ON DIALYSIS DAYS. 10/15/18   Ladell Pier, MD  gabapentin (NEURONTIN) 300 MG capsule Take 1 capsule (300 mg total) by mouth at bedtime. Patient not  taking: Reported on 09/09/2018 02/17/18   Elgergawy, Silver Huguenin, MD  gabapentin (NEURONTIN) 300 MG capsule TAKE 2 CAPSULES (600 MG TOTAL) BY MOUTH AT BEDTIME. 10/15/18   Ladell Pier, MD  glucose blood (TRUE METRIX BLOOD GLUCOSE TEST) test strip Use 3 times daily before meals 07/25/17   [name], MD  TRUEPLUS INSULIN SYRINGE 31G X 5/16" 1 ML MISC USE AS DIRECTED 07/22/18   [name], MD  TRUEPLUS LANCETS 28G MISC 1 each by Does not apply route 3 (three) times daily before meals. 07/25/17   [name], MD     History reviewed. No pertinent family history.  Social History   Socioeconomic History  . Marital status: Widowed    Spouse name: Not on file  . Number of children: 11  . Years of education: no schooling  . Highest education level: Not on file  Occupational History  . Not on file  Tobacco Use  . Smoking status: Never Smoker  . Smokeless  tobacco: Never Used  Vaping Use  . Vaping Use: Never used  Substance and Sexual Activity  . Alcohol use: Not Currently    Alcohol/week: 0.0 standard drinks    Comment: rare- a beer every now and then  . Drug use: No  . Sexual activity: Not Currently  Other Topics Concern  . Not on file  Social History Narrative   Lives with son Marcial    Right handed   Caffeine: every once in a while a little coffee, chamomile tea, or soda   Social Determinants of Health   Financial Resource Strain: Not on file  Food Insecurity: Not on file  Transportation Needs: Not on file  Physical Activity: Not on file  Stress: Not on file  Social Connections: Not on file     Review of Systems: A 12 point ROS discussed and pertinent positives are indicated in the HPI above.  All other systems are negative.  Review of Systems  Constitutional: Negative for fatigue and fever.  Respiratory: Negative for cough and shortness of breath.   Cardiovascular: Negative for chest pain.  Gastrointestinal: Negative for abdominal pain.  Musculoskeletal:  Negative for back pain.  Psychiatric/Behavioral: Negative for behavioral problems and confusion.    Vital Signs: BP (!) 189/53   Pulse 63   Temp 98.1 F (36.7 C) (Oral)   Wt 133 lb 6.1 oz (60.5 kg)   LMP  (LMP Unknown)   SpO2 100%   BMI 26.05 kg/m   Physical Exam Vitals and nursing note reviewed.  Constitutional:      Appearance: Normal appearance.  HENT:     Mouth/Throat:     Mouth: Mucous membranes are moist.     Pharynx: Oropharynx is clear.  Cardiovascular:     Rate and Rhythm: Normal rate and regular rhythm.  Pulmonary:     Effort: Pulmonary effort is normal.     Breath sounds: Normal breath sounds.  Musculoskeletal:     Comments: LUA fistula present. Distal pulse, No thrill.   Skin:    General: Skin is warm and dry.  Neurological:     General: No focal deficit present.     Mental Status: She is alert and oriented to person, place, and time. Mental status is at baseline.  Psychiatric:        Mood and Affect: Mood normal.        Behavior: Behavior normal.        Thought Content: Thought content normal.        Judgment: Judgment normal.      MD Evaluation Airway: WNL Heart: WNL Abdomen: WNL Chest/ Lungs: WNL ASA  Classification: 3 Mallampati/Airway Score: Two   Imaging: IR THROMBECTOMY AV FISTULA W/THROMBOLYSIS/PTA/STENT INC SHUNT/IMG RT  Result Date: 09/24/2020 CLINICAL DATA:  End-stage renal disease with indwelling right upper arm brachiocephalic AV fistula. Status post prior declot procedure of this access on 07/16/2020 and angioplasty the cephalic vein on [id number]. Increased bleeding from access sites after hemodialysis and right arm pain during dialysis. EXAM: 1.  DIALYSIS AV FISTULOGRAM 2.  ANGIOPLASTY OF RIGHT CEPHALIC VEIN COMPARISON:  07/16/2020 CONTRAST:  50 mL Omnipaque 300 FLUOROSCOPY TIME:  2.0 minutes.  26.0 mGy. MEDICATIONS: Moderate (conscious) sedation was employed during this procedure. A total of Versed 1.0 mg and Fentanyl 50 mcg was  administered intravenously. Moderate Sedation Time: 25 minutes. The patient's level of consciousness and vital signs were monitored continuously by radiology nursing throughout the procedure under my direct supervision. PROCEDURE: The procedure, risks, benefits,  and alternatives were explained to the patient. Questions regarding the procedure were encouraged and answered. The patient understands and consents to the procedure. A time-out was performed prior to initiating the procedure. The right upper arm dialysis AV fistula was prepped with chlorhexidine in a sterile fashion, and a sterile drape was applied covering the operative field. A diagnostic fistula study was performed via an 18 gauge angiocatheter introduced into venous outflow. Venous drainage was assessed to the level of the central veins in the chest. Proximal fistula was studied by reflux maneuver with temporary compression of venous outflow. The angiocath was removed and replaced with a 6-French sheath over a guidewire. Balloon angioplasty of the right cephalic vein was then performed with a 6 mm x 10 cm Mustang balloon over overlapping segments in the right upper arm. Balloon angioplasty was followed by additional angiography. The sheath was removed and hemostasis obtained with application of a 2-0 Ethilon pursestring suture. COMPLICATIONS: None FINDINGS: Anastomosis between the brachial artery and cephalic vein is normally patent. Beginning at roughly the level of the mid humerus, there are multiple tandem areas of high-grade stenosis of the outflow cephalic vein approaching greater than 90% in maximum caliber. Collateral veins are present. The central arch of the cephalic vein is normally patent and normal outflow is noted via central veins in the chest. After 6 mm balloon angioplasty, there is significant improvement in areas of cephalic vein stenosis in the upper arm. Mild irregularity remained in 2 separate areas with maximal narrowing of  approximately 30-40 %. Significantly improved flow was present after balloon angioplasty. IMPRESSION: Significant tandem high-grade stenoses of the outflow right cephalic vein in the upper arm with several areas of stenosis approaching greater than 90% in caliber. Long segment 6 mm balloon angioplasty was performed with significant improvement in cephalic vein patency. Two separate areas of roughly 30-40% stenosis remain on completion. ACCESS: The fistula remains amenable to percutaneous intervention. Electronically Signed   By: Aletta Edouard M.D.   On: 09/24/2020 15:54    Labs:  CBC: Recent Labs    07/15/20 0332 07/16/20 0300 07/17/20 0332 07/18/20 0705  WBC 5.3 6.3 5.3 4.3  HGB 10.0* 10.8* 10.2* 9.3*  HCT 31.9* 35.3* 33.3* 29.2*  PLT 180 175 145* 145*    COAGS: Recent Labs    07/16/20 0300 10/08/20 0907  INR 1.2 1.1    BMP: Recent Labs    06/11/20 1013 07/14/20 1210 07/17/20 0332 07/17/20 0818 07/17/20 1912 07/18/20 0705 09/24/20 1138 10/08/20 0907  NA 139   < > 135  --   --  138 135 139  K 4.0   < > 3.3*   < > 4.0 4.0 6.5* 4.1  CL 96   < > 94*  --   --  98 99 99  CO2 27   < > 27  --   --  28 28 33*  GLUCOSE 50*   < > 116*  --   --  146* 57* 75  BUN 30*   < > 20  --   --  26* 78* 25*  CALCIUM 9.9   < > 8.5*  --   --  8.5* 9.6 9.5  CREATININE 5.53*   < > 4.56*  --   --  7.10* 9.60* 5.54*  GFRNONAA 7*   < > 10*  --   --  6* 4* 8*  GFRAA 8*  --   --   --   --   --   --   --    < > =  values in this interval not displayed.    LIVER FUNCTION TESTS: Recent Labs    06/11/20 1013 07/14/20 2122 07/15/20 0427  BILITOT 0.3  --   --   AST 17  --   --   ALT 15  --   --   ALKPHOS 177*  --   --   PROT 6.7  --   --   ALBUMIN 3.6* 2.7* 2.7*    TUMOR MARKERS: No results for input(s): AFPTM, CEA, CA199, CHROMGRNA in the last 8760 hours.  Assessment and Plan: Patient with past medical history of ESRD on HD via LUA fistula presents with complaint of clotted access  after HD session yesterday.  IR consulted for fistulagram, possible intervention. Case reviewed by Dr. Earleen Newport who approves patient for procedure.  Patient presents today in their usual state of health.  She has been NPO and is not currently on blood thinners. Her son provides history and assists with consent.  Interpreter present during visit.   Risks and benefits discussed including, but not limited to bleeding, infection, vascular injury, pulmonary embolism, need for tunneled HD catheter placement or even death.  All of the patient's questions were answered, patient and family are agreeable to proceed. Consent signed and in chart.  Thank you for this interesting consult.  I greatly enjoyed meeting [facility] and look forward to participating in their care.  A copy of this report was sent to the requesting provider on this date.  Electronically Signed: Docia Barrier, PA 10/08/2020, 10:15 AM   I spent a total of    25 Minutes in face to face in clinical consultation, greater than 50% of which was counseling/coordinating care for renal failure.

## 2020-10-21 ENCOUNTER — Other Ambulatory Visit: Payer: Self-pay | Admitting: Physician Assistant

## 2020-10-21 DIAGNOSIS — R1310 Dysphagia, unspecified: Secondary | ICD-10-CM

## 2020-10-21 MED FILL — Atorvastatin Calcium Tab 40 MG (Base Equivalent): ORAL | 30 days supply | Qty: 30 | Fill #0 | Status: AC

## 2020-10-21 MED FILL — Lisinopril Tab 20 MG: ORAL | 30 days supply | Qty: 30 | Fill #0 | Status: AC

## 2020-10-21 MED FILL — Gabapentin Cap 300 MG: ORAL | 30 days supply | Qty: 30 | Fill #0 | Status: AC

## 2020-10-21 MED FILL — Duloxetine HCl Enteric Coated Pellets Cap 60 MG (Base Eq): ORAL | 30 days supply | Qty: 30 | Fill #0 | Status: AC

## 2020-10-22 ENCOUNTER — Other Ambulatory Visit: Payer: Self-pay

## 2020-10-22 MED FILL — Insulin Glargine Inj 100 Unit/ML: SUBCUTANEOUS | 28 days supply | Qty: 10 | Fill #0 | Status: AC

## 2020-10-26 ENCOUNTER — Other Ambulatory Visit: Payer: Self-pay

## 2020-10-27 ENCOUNTER — Other Ambulatory Visit: Payer: Self-pay

## 2020-11-08 ENCOUNTER — Other Ambulatory Visit: Payer: Self-pay | Admitting: Family Medicine

## 2020-11-08 ENCOUNTER — Other Ambulatory Visit: Payer: Self-pay

## 2020-11-08 DIAGNOSIS — E119 Type 2 diabetes mellitus without complications: Secondary | ICD-10-CM

## 2020-11-08 DIAGNOSIS — Z794 Long term (current) use of insulin: Secondary | ICD-10-CM

## 2020-11-08 MED ORDER — TRUE METRIX BLOOD GLUCOSE TEST VI STRP
ORAL_STRIP | 3 refills | Status: DC
  Filled 2020-11-08: qty 100, 33d supply, fill #0
  Filled 2021-04-19: qty 100, 33d supply, fill #1
  Filled 2021-10-13: qty 100, 33d supply, fill #0

## 2020-11-08 MED FILL — Ethyl Chloride Aerosol Spray: CUTANEOUS | 25 days supply | Qty: 116 | Fill #1 | Status: AC

## 2020-11-09 ENCOUNTER — Other Ambulatory Visit: Payer: Self-pay

## 2020-11-10 ENCOUNTER — Other Ambulatory Visit: Payer: Self-pay

## 2020-11-26 ENCOUNTER — Other Ambulatory Visit: Payer: Self-pay

## 2020-11-26 MED FILL — Lisinopril Tab 20 MG: ORAL | 30 days supply | Qty: 30 | Fill #1 | Status: AC

## 2020-11-26 MED FILL — Atorvastatin Calcium Tab 40 MG (Base Equivalent): ORAL | 30 days supply | Qty: 30 | Fill #1 | Status: AC

## 2020-12-02 ENCOUNTER — Other Ambulatory Visit: Payer: Self-pay | Admitting: Physician Assistant

## 2020-12-02 DIAGNOSIS — E1149 Type 2 diabetes mellitus with other diabetic neurological complication: Secondary | ICD-10-CM

## 2020-12-02 MED FILL — Duloxetine HCl Enteric Coated Pellets Cap 60 MG (Base Eq): ORAL | 30 days supply | Qty: 30 | Fill #1 | Status: AC

## 2020-12-02 MED FILL — Insulin Glargine Inj 100 Unit/ML: SUBCUTANEOUS | 28 days supply | Qty: 10 | Fill #1 | Status: CN

## 2020-12-03 ENCOUNTER — Other Ambulatory Visit: Payer: Self-pay

## 2020-12-03 MED ORDER — GABAPENTIN 300 MG PO CAPS
ORAL_CAPSULE | Freq: Every day | ORAL | 2 refills | Status: DC
  Filled 2020-12-03 – 2020-12-13 (×2): qty 30, 30d supply, fill #0
  Filled 2021-01-14: qty 30, 30d supply, fill #1
  Filled 2021-02-10 – 2021-02-14 (×2): qty 30, 30d supply, fill #2

## 2020-12-03 MED FILL — Insulin Glargine Inj 100 Unit/ML: SUBCUTANEOUS | 28 days supply | Qty: 10 | Fill #1 | Status: AC

## 2020-12-06 ENCOUNTER — Other Ambulatory Visit: Payer: Self-pay

## 2020-12-13 ENCOUNTER — Other Ambulatory Visit: Payer: Self-pay

## 2020-12-15 ENCOUNTER — Other Ambulatory Visit: Payer: Self-pay

## 2020-12-20 MED FILL — Atorvastatin Calcium Tab 40 MG (Base Equivalent): ORAL | 30 days supply | Qty: 30 | Fill #2 | Status: AC

## 2020-12-20 MED FILL — Lisinopril Tab 20 MG: ORAL | 30 days supply | Qty: 30 | Fill #2 | Status: AC

## 2020-12-21 ENCOUNTER — Other Ambulatory Visit: Payer: Self-pay

## 2020-12-22 ENCOUNTER — Other Ambulatory Visit: Payer: Self-pay

## 2020-12-24 ENCOUNTER — Other Ambulatory Visit (HOSPITAL_COMMUNITY): Payer: Self-pay | Admitting: Nephrology

## 2020-12-24 ENCOUNTER — Other Ambulatory Visit: Payer: Self-pay

## 2020-12-24 ENCOUNTER — Other Ambulatory Visit: Payer: Self-pay | Admitting: Family Medicine

## 2020-12-24 DIAGNOSIS — Z992 Dependence on renal dialysis: Secondary | ICD-10-CM

## 2020-12-24 DIAGNOSIS — N186 End stage renal disease: Secondary | ICD-10-CM

## 2020-12-24 MED ORDER — ETHYL CHLORIDE EX AERO
INHALATION_SPRAY | CUTANEOUS | 0 refills | Status: DC
  Filled 2020-12-24: qty 116, 30d supply, fill #0

## 2020-12-24 NOTE — Telephone Encounter (Signed)
Requested medication (s) are due for refill today -expired Rx  Requested medication (s) are on the active medication list -no  Future visit scheduled -yes  Last refill: 12/24/19 116 ml 10 RF  Notes to clinic: Request medication not assigned to protocol  Requested Prescriptions  Pending Prescriptions Disp Refills   ethyl chloride spray 116 mL 10    Sig: SPRAY 1 SPRAY TO THE SKIN 3 TIMES A WEEK AS NEEDED. SPRAY A SMALL AMOUNT JUST PRIOR TO NEEDLE INSERTION.      Off-Protocol Failed - 12/24/2020  1:22 PM      Failed - Medication not assigned to a protocol, review manually.      Passed - Valid encounter within last 12 months    Recent Outpatient Visits           4 months ago Muscle cramp   [place], [place], MD   6 months ago Type 2 diabetes mellitus with chronic kidney disease on chronic dialysis, with long-term current use of insulin ([place])   [place], Cari S, PA-C   1 year ago Type 2 diabetes mellitus with chronic kidney disease on chronic dialysis, with long-term current use of insulin ([place])   [place], [place], MD   1 year ago Type 2 diabetes mellitus with chronic kidney disease on chronic dialysis, with long-term current use of insulin ([place])   [place] [name], MD   1 year ago Medication refill   [place], MD       Future Appointments             In 1 week Thereasa Solo Dionne Bucy, PA-C [place]                 Requested Prescriptions  Pending Prescriptions Disp Refills   ethyl chloride spray 116 mL 10    Sig: SPRAY 1 SPRAY TO THE SKIN 3 TIMES A WEEK AS NEEDED. SPRAY A SMALL AMOUNT JUST PRIOR TO NEEDLE INSERTION.      Off-Protocol Failed - 12/24/2020  1:22 PM      Failed - Medication not assigned to a protocol, review manually.       Passed - Valid encounter within last 12 months    Recent Outpatient Visits           4 months ago Muscle cramp   [place], [place], MD   6 months ago Type 2 diabetes mellitus with chronic kidney disease on chronic dialysis, with long-term current use of insulin ([place])   [place], Cari S, PA-C   1 year ago Type 2 diabetes mellitus with chronic kidney disease on chronic dialysis, with long-term current use of insulin ([place])   [place], [place], MD   1 year ago Type 2 diabetes mellitus with chronic kidney disease on chronic dialysis, with long-term current use of insulin ([place])   [place], MD   1 year ago Medication refill   [place], MD       Future Appointments             In 1 week Thereasa Solo, [place]

## 2020-12-27 ENCOUNTER — Other Ambulatory Visit: Payer: Self-pay

## 2020-12-28 ENCOUNTER — Other Ambulatory Visit: Payer: Self-pay | Admitting: Student

## 2020-12-29 ENCOUNTER — Other Ambulatory Visit (HOSPITAL_COMMUNITY): Payer: Self-pay | Admitting: Nephrology

## 2020-12-29 ENCOUNTER — Ambulatory Visit (HOSPITAL_COMMUNITY)
Admission: RE | Admit: 2020-12-29 | Discharge: 2020-12-29 | Disposition: A | Discharge status: Home / Self Care | Payer: Self-pay | Source: Ambulatory Visit | Attending: Nephrology | Admitting: Nephrology

## 2020-12-29 ENCOUNTER — Encounter (HOSPITAL_COMMUNITY): Payer: Self-pay

## 2020-12-29 ENCOUNTER — Other Ambulatory Visit: Payer: Self-pay

## 2020-12-29 DIAGNOSIS — I120 Hypertensive chronic kidney disease with stage 5 chronic kidney disease or end stage renal disease: Secondary | ICD-10-CM | POA: Insufficient documentation

## 2020-12-29 DIAGNOSIS — N186 End stage renal disease: Secondary | ICD-10-CM

## 2020-12-29 DIAGNOSIS — E1122 Type 2 diabetes mellitus with diabetic chronic kidney disease: Secondary | ICD-10-CM | POA: Insufficient documentation

## 2020-12-29 DIAGNOSIS — Z79899 Other long term (current) drug therapy: Secondary | ICD-10-CM | POA: Insufficient documentation

## 2020-12-29 DIAGNOSIS — Z992 Dependence on renal dialysis: Secondary | ICD-10-CM | POA: Insufficient documentation

## 2020-12-29 DIAGNOSIS — Z794 Long term (current) use of insulin: Secondary | ICD-10-CM | POA: Insufficient documentation

## 2020-12-29 DIAGNOSIS — Y841 Kidney dialysis as the cause of abnormal reaction of the patient, or of later complication, without mention of misadventure at the time of the procedure: Secondary | ICD-10-CM | POA: Insufficient documentation

## 2020-12-29 DIAGNOSIS — T82868A Thrombosis of vascular prosthetic devices, implants and grafts, initial encounter: Secondary | ICD-10-CM | POA: Insufficient documentation

## 2020-12-29 HISTORY — PX: IR FLUORO GUIDE CV LINE RIGHT: IMG2283

## 2020-12-29 HISTORY — PX: IR US GUIDE VASC ACCESS RIGHT: IMG2390

## 2020-12-29 LAB — POCT I-STAT, CHEM 8
BUN: >130 mg/dL — ABNORMAL HIGH (ref 8–23)
Calcium, Ion: 1.21 mmol/L (ref 1.15–1.40)
Chloride: 101 mmol/L (ref 98–111)
Creatinine, Ser: 16.7 mg/dL — ABNORMAL HIGH (ref 0.44–1.00)
Glucose, Bld: 64 mg/dL — ABNORMAL LOW (ref 70–99)
HCT: 25 % — ABNORMAL LOW (ref 36.0–46.0)
Hemoglobin: 8.5 g/dL — ABNORMAL LOW (ref 12.0–15.0)
Potassium: 6.4 mmol/L (ref 3.5–5.1)
Sodium: 134 mmol/L — ABNORMAL LOW (ref 135–145)
TCO2: 24 mmol/L (ref 22–32)

## 2020-12-29 LAB — GLUCOSE, CAPILLARY: Glucose-Capillary: 70 mg/dL (ref 70–99)

## 2020-12-29 MED ORDER — LIDOCAINE HCL (PF) 1 % IJ SOLN
INTRAMUSCULAR | Status: AC | PRN
  Administered 2020-12-29: 10 mL

## 2020-12-29 MED ORDER — HEPARIN SODIUM (PORCINE) 1000 UNIT/ML IJ SOLN
INTRAMUSCULAR | Status: AC
  Filled 2020-12-29: qty 1

## 2020-12-29 MED ORDER — LIDOCAINE HCL 1 % IJ SOLN
INTRAMUSCULAR | Status: AC
  Filled 2020-12-29: qty 20

## 2020-12-29 MED ORDER — CEFAZOLIN SODIUM-DEXTROSE 2-4 GM/100ML-% IV SOLN: 2.0000 g | Freq: Three times a day (TID) | INTRAVENOUS | Status: DC

## 2020-12-29 MED ORDER — CEFAZOLIN SODIUM-DEXTROSE 2-4 GM/100ML-% IV SOLN: 2.0000 g | Freq: Once | INTRAVENOUS | Status: AC

## 2020-12-29 MED ORDER — SODIUM CHLORIDE 0.9 % IV SOLN: INTRAVENOUS | Status: DC

## 2020-12-29 MED ORDER — CEFAZOLIN SODIUM-DEXTROSE 2-4 GM/100ML-% IV SOLN
INTRAVENOUS | Status: AC
  Administered 2020-12-29: 2 g via INTRAVENOUS
  Filled 2020-12-29: qty 100

## 2020-12-29 NOTE — Procedures (Signed)
Interventional Radiology Procedure Note  Procedure: Tunneled HD catheter  Indication: Clotted fistula.  Hyperkalemia.  Findings: Please refer to procedural dictation for full description.  Complications: None  EBL: < 10 mL  Miachel Roux, MD [phone]

## 2020-12-29 NOTE — H&P (Signed)
Chief Complaint: Patient was seen in consultation today for renal failure  Referring Physician(s): Coladonato,Joseph  Supervising Physician: Mir, Biochemist, clinical  Patient Status: [facility] - Out-pt  History of Present Illness: Dicy Sarah [facility] is a 70 y.o. female with past medical history of DM, HTN, ESRD on dialysis via LUA fistula which has been previously declotted/angioplastied several times in IR since 2018.  Most recently, she underwent left upper arm brachiocephalic AV fistula angioplasty with Dr. Earleen Newport 10/08/20.  Per patient's son she went to HD yesterday and was unable to access for any dialysis.  She has been referred to IR today for  declot vs. Tunneled catheter placement. Patient has been NPO.  She does not take blood thinners.   She is accompanied by family and interpreter today.  Denies new concerns.  In her usual state of health, alert, and participant today. She is scheduled for regular HD session tomorrow.   Past Medical History:  Diagnosis Date   Diabetes mellitus    ESRD (end stage renal disease) ([place])    dialysis   Hyperlipidemia    Hypertension    Iron deficiency anemia    MRSA infection    hx of   Neuropathy    Diabetic   Pneumonia 07/2016   Renal disorder    Seizure-like activity ([place])    Sepsis ([place])    Thyroid disease    Wears dentures     Past Surgical History:  Procedure Laterality Date   AMPUTATION Left 05/21/2015   Procedure: Left Foot 3rd Ray Amputation;  Surgeon: Newt Minion, MD;  Location: [place];  Service: Orthopedics;  Laterality: Left;   AV FISTULA PLACEMENT Left 03/28/2016   Procedure: ARTERIOVENOUS (AV) FISTULA CREATION LEFT ARM;  Surgeon: Waynetta Sandy, MD;  Location: [place];  Service: Vascular;  Laterality: Left;   AV FISTULA PLACEMENT Right 06/04/2019   Procedure: ARTERIOVENOUS (AV) FISTULA CREATION RIGHT ARM;  Surgeon: Waynetta Sandy, MD;  Location: [place];  Service: Vascular;  Laterality: Right;   DILATION AND  CURETTAGE OF UTERUS     EYE SURGERY     FISTULA SUPERFICIALIZATION Left 09/04/2016   Procedure: FISTULA SUPERFICIALIZATION LEFT UPPER ARM;  Surgeon: Waynetta Sandy, MD;  Location: [place];  Service: Vascular;  Laterality: Left;   FISTULA SUPERFICIALIZATION Right 10/08/2019   Procedure: FISTULA SUPERFICIALIZATION OR RIGHT ARM;  Surgeon: Waynetta Sandy, MD;  Location: [place];  Service: Vascular;  Laterality: Right;   IR AV DIALY SHUNT INTRO NEEDLE/INTRAC INITIAL W/PTA/STENT/IMG LT Left 06/13/2017   IR AV DIALY SHUNT INTRO NEEDLE/INTRAC INITIAL W/PTA/STENT/IMG LT Left 09/12/2017   IR AV DIALY SHUNT INTRO NEEDLE/INTRACATH INITIAL W/PTA/IMG LEFT  11/10/2016   IR AV DIALY SHUNT INTRO NEEDLE/INTRACATH INITIAL W/PTA/IMG LEFT  03/09/2017   IR AV DIALY SHUNT INTRO NEEDLE/INTRACATH INITIAL W/PTA/IMG LEFT  12/12/2017   IR AV DIALY SHUNT INTRO NEEDLE/INTRACATH INITIAL W/PTA/IMG LEFT  02/27/2018   IR AV DIALY SHUNT INTRO NEEDLE/INTRACATH INITIAL W/PTA/IMG LEFT  06/05/2018   IR AV DIALY SHUNT INTRO NEEDLE/INTRACATH INITIAL W/PTA/IMG LEFT  09/11/2018   IR AV DIALY SHUNT INTRO NEEDLE/INTRACATH INITIAL W/PTA/IMG RIGHT Right 04/21/2020   IR AV DIALY SHUNT INTRO NEEDLE/INTRACATH INITIAL W/PTA/IMG RIGHT Right 10/08/2020   IR DIALY SHUNT INTRO NEEDLE/INTRACATH INITIAL W/IMG LEFT Left 05/08/2018   IR FLUORO GUIDE CV LINE RIGHT  02/01/2019   IR FLUORO GUIDE CV LINE RIGHT  07/14/2020   IR GENERIC HISTORICAL  08/01/2016   IR FLUORO GUIDE CV LINE RIGHT 08/01/2016 [name], MD  [facility]   IR GENERIC HISTORICAL  08/01/2016   IR US GUIDE VASC ACCESS RIGHT 08/01/2016 [name], MD [facility]   IR REMOVAL TUN CV CATH W/O FL  11/01/2016   IR REMOVAL TUN CV CATH W/O FL  01/02/2020   IR THROMBECTOMY AV FISTULA W/THROMBOLYSIS INC/SHUNT/IMG LEFT Left 01/31/2019   IR THROMBECTOMY AV FISTULA W/THROMBOLYSIS/PTA INC/SHUNT/IMG LEFT Left 01/27/2019   IR THROMBECTOMY AV FISTULA W/THROMBOLYSIS/PTA INC/SHUNT/IMG LEFT Left 07/16/2020    IR THROMBECTOMY AV FISTULA W/THROMBOLYSIS/PTA/STENT INC/SHUNT/IMG RT Right 09/24/2020   IR US GUIDE VASC ACCESS LEFT  11/10/2016   IR US GUIDE VASC ACCESS LEFT  09/12/2017   IR US GUIDE VASC ACCESS LEFT  12/12/2017   IR US GUIDE VASC ACCESS LEFT  06/05/2018   IR US GUIDE VASC ACCESS LEFT  09/11/2018   IR US GUIDE VASC ACCESS LEFT  01/27/2019   IR US GUIDE VASC ACCESS LEFT  02/01/2019   IR US GUIDE VASC ACCESS RIGHT  02/01/2019   IR US GUIDE VASC ACCESS RIGHT  07/14/2020   IR US GUIDE VASC ACCESS RIGHT  07/16/2020   IR US GUIDE VASC ACCESS RIGHT  10/08/2020    Allergies: Patient has no known allergies.  Medications: Prior to Admission medications   Medication Sig Start Date End Date Taking? Authorizing Provider  atorvastatin (LIPITOR) 40 MG tablet Take 1 tablet (40 mg total) by mouth daily. 01/24/19  Yes [name], MD  cetirizine (ZYRTEC) 10 MG tablet Take 1 tablet (10 mg total) by mouth daily. 01/24/19  Yes Newlin, Charlane Ferretti, MD  DULoxetine (CYMBALTA) 60 MG capsule TAKE 1 CAPSULE (60 MG TOTAL) BY MOUTH DAILY. 12/05/18  Yes [name], MD  insulin glargine (LANTUS) 100 UNIT/ML injection Inject subcutaneously twice daily 20 units in the morning and 15 units in the evening 07/25/18  Yes McClung, Angela M, PA-C  lisinopril (PRINIVIL,ZESTRIL) 20 MG tablet Take 1 tablet (20 mg total) by mouth daily. 10/16/18  Yes [name], MD  Amino Acids-Protein Hydrolys (FEEDING SUPPLEMENT, PRO-STAT SUGAR FREE 64,) LIQD Take 30 mLs by mouth 2 (two) times daily. 02/17/18   Elgergawy, Silver Huguenin, MD  bacitracin 500 UNIT/GM ointment Apply 1 application topically 2 (two) times daily. 03/27/18   [name], MD  Blood Glucose Monitoring Suppl (TRUE METRIX METER) DEVI 1 each by Does not apply route 3 (three) times daily before meals. 07/25/17   [name], MD  cephALEXin (KEFLEX) 500 MG capsule TAKE 1 CAPSULE (500 MG TOTAL) BY MOUTH DAILY. GIVE AFTER HEMODIALYSIS ON DIALYSIS DAYS. 10/15/18   Ladell Pier, MD   gabapentin (NEURONTIN) 300 MG capsule Take 1 capsule (300 mg total) by mouth at bedtime. Patient not taking: Reported on 09/09/2018 02/17/18   Elgergawy, Silver Huguenin, MD  gabapentin (NEURONTIN) 300 MG capsule TAKE 2 CAPSULES (600 MG TOTAL) BY MOUTH AT BEDTIME. 10/15/18   Ladell Pier, MD  glucose blood (TRUE METRIX BLOOD GLUCOSE TEST) test strip Use 3 times daily before meals 07/25/17   [name], MD  TRUEPLUS INSULIN SYRINGE 31G X 5/16" 1 ML MISC USE AS DIRECTED 07/22/18   [name], MD  TRUEPLUS LANCETS 28G MISC 1 each by Does not apply route 3 (three) times daily before meals. 07/25/17   [name], MD     History reviewed. No pertinent family history.  Social History   Socioeconomic History   Marital status: Widowed    Spouse name: Not on file   Number of children: 11   Years of education:  no schooling   Highest education level: Not on file  Occupational History   Not on file  Tobacco Use   Smoking status: Never   Smokeless tobacco: Never  Vaping Use   Vaping Use: Never used  Substance and Sexual Activity   Alcohol use: Not Currently    Alcohol/week: 0.0 standard drinks    Comment: rare- a beer every now and then   Drug use: No   Sexual activity: Not Currently  Other Topics Concern   Not on file  Social History Narrative   Lives with son Marcial    Right handed   Caffeine: every once in a while a little coffee, chamomile tea, or soda   Social Determinants of Health   Financial Resource Strain: Not on file  Food Insecurity: Not on file  Transportation Needs: Not on file  Physical Activity: Not on file  Stress: Not on file  Social Connections: Not on file     Review of Systems: A 12 point ROS discussed and pertinent positives are indicated in the HPI above.  All other systems are negative.  Review of Systems  Constitutional:  Negative for fatigue and fever.  Respiratory:  Negative for cough and shortness of breath.   Cardiovascular:  Negative for  chest pain.  Gastrointestinal:  Negative for abdominal pain.  Musculoskeletal:  Negative for back pain.  Psychiatric/Behavioral:  Negative for behavioral problems and confusion.    Vital Signs: BP (!) 150/55   Temp 98.2 F (36.8 C) (Oral)   Resp 18   Ht '5\' 3"'$  (1.6 m)   Wt 140 lb (63.5 kg)   LMP  (LMP Unknown)   SpO2 100%   BMI 24.80 kg/m   Physical Exam Vitals and nursing note reviewed.  Constitutional:      Appearance: Normal appearance.  HENT:     Mouth/Throat:     Mouth: Mucous membranes are moist.     Pharynx: Oropharynx is clear.  Cardiovascular:     Rate and Rhythm: Normal rate and regular rhythm.  Pulmonary:     Effort: Pulmonary effort is normal.     Breath sounds: Normal breath sounds.  Musculoskeletal:     Comments: RUA fistula present. No bruit or thrill. Old bruise present at superior access site. No swelling or tenderness.   Skin:    General: Skin is warm and dry.  Neurological:     General: No focal deficit present.     Mental Status: She is alert and oriented to person, place, and time. Mental status is at baseline.  Psychiatric:        Mood and Affect: Mood normal.        Behavior: Behavior normal.        Thought Content: Thought content normal.        Judgment: Judgment normal.     MD Evaluation Airway: WNL Heart: WNL Abdomen: WNL Chest/ Lungs: WNL ASA  Classification: 3 Mallampati/Airway Score: Two   Imaging: No results found.   Labs:  CBC: Recent Labs    07/16/20 0300 07/17/20 0332 07/18/20 0705 10/08/20 0907  WBC 6.3 5.3 4.3 4.5  HGB 10.8* 10.2* 9.3* 11.2*  HCT 35.3* 33.3* 29.2* 35.4*  PLT 175 145* 145* 153     COAGS: Recent Labs    07/16/20 0300 10/08/20 0907  INR 1.2 1.1     BMP: Recent Labs    06/11/20 1013 07/14/20 1210 07/17/20 0332 07/17/20 0818 07/17/20 1912 07/18/20 0705 09/24/20 1138 10/08/20 0907  NA  139   < > 135  --   --  138 135 139  K 4.0   < > 3.3*   < > 4.0 4.0 6.5* 4.1  CL 96   < > 94*   --   --  98 99 99  CO2 27   < > 27  --   --  28 28 33*  GLUCOSE 50*   < > 116*  --   --  146* 57* 75  BUN 30*   < > 20  --   --  26* 78* 25*  CALCIUM 9.9   < > 8.5*  --   --  8.5* 9.6 9.5  CREATININE 5.53*   < > 4.56*  --   --  7.10* 9.60* 5.54*  GFRNONAA 7*   < > 10*  --   --  6* 4* 8*  GFRAA 8*  --   --   --   --   --   --   --    < > = values in this interval not displayed.     LIVER FUNCTION TESTS: Recent Labs    06/11/20 1013 07/14/20 2122 07/15/20 0427  BILITOT 0.3  --   --   AST 17  --   --   ALT 15  --   --   ALKPHOS 177*  --   --   PROT 6.7  --   --   ALBUMIN 3.6* 2.7* 2.7*     TUMOR MARKERS: No results for input(s): AFPTM, CEA, CA199, CHROMGRNA in the last 8760 hours.  Assessment and Plan: Patient with past medical history of ESRD on HD via RUA fistula presents with complaint of clotted access.  IR consulted for fistulagram, possible intervention. Case reviewed by Dr. Dwaine Gale who approves patient for tunneled dialysis catheter placement today.  Per most recent fistulagram with Dr. Earleen Newport 4/8 "Given the small caliber of the entire cephalic vein outflow, would anticipate recurrence of the stenosis before 6 months and referral for surgical evaluation for alternative access may be considered at this time based on the results of duplex exam."  Patient presents today in their usual state of health.  Her K is elevated at 6.4.   She has been NPO and is not currently on blood thinners. Her son provides history and assists with consent.  Interpreter present during visit.  Plan made by Dr. Dwaine Gale for tunneled dialysis catheter placement today without sedation. Patient to proceed to outpatient dialysis center today for session and treatment of her hyperkalemia.  PA called HD center to confirm the above plan is in place.  Son to provide transportation.   Risks and benefits discussed including, but not limited to bleeding, infection, vascular injury, pulmonary embolism, need for tunneled  HD catheter placement or even death.  All of the patient's questions were answered, patient and family are agreeable to proceed. Consent signed and in chart.  Thank you for this interesting consult.  I greatly enjoyed meeting [facility] and look forward to participating in their care.  A copy of this report was sent to the requesting provider on this date.  Electronically Signed: Docia Barrier, PA 12/29/2020, 11:09 AM   I spent a total of    25 Minutes in face to face in clinical consultation, greater than 50% of which was counseling/coordinating care for renal failure.

## 2020-12-29 NOTE — Progress Notes (Signed)
HD catheter placement completed in IR. Patient dressed and IV removed. Copy of discharge instructions given to patient son. Patient and son educated on care  related to HD catheter line care with translator present. Patient son also aware and states understanding that patient will need dialysis today and dialysis center is expecting patient once catheter is placed. Patient escorted to main lobby with this RN and son. All patient belongings taken home with patient.

## 2021-01-05 ENCOUNTER — Ambulatory Visit: Payer: Self-pay | Admitting: Physician Assistant

## 2021-01-05 ENCOUNTER — Telehealth: Payer: Self-pay | Admitting: Physician Assistant

## 2021-01-05 ENCOUNTER — Other Ambulatory Visit: Payer: Self-pay

## 2021-01-05 DIAGNOSIS — R1084 Generalized abdominal pain: Secondary | ICD-10-CM

## 2021-01-05 DIAGNOSIS — R1310 Dysphagia, unspecified: Secondary | ICD-10-CM

## 2021-01-05 DIAGNOSIS — G4700 Insomnia, unspecified: Secondary | ICD-10-CM

## 2021-01-05 MED ORDER — OMEPRAZOLE 20 MG PO CPDR
DELAYED_RELEASE_CAPSULE | Freq: Every day | ORAL | 3 refills | Status: DC
  Filled 2021-01-05: qty 30, 30d supply, fill #0
  Filled 2021-02-10: qty 30, 30d supply, fill #1
  Filled 2021-03-10 – 2021-03-28 (×2): qty 30, 30d supply, fill #2

## 2021-01-05 NOTE — Progress Notes (Signed)
Established Patient Office Visit  Subjective:  Patient ID: Sarah Kelley, female    DOB: 1950-08-02  Age: 70 y.o. MRN: [id number]  CC:  Chief Complaint  Patient presents with   sleep issues    Virtual Visit via Telephone Note  I connected with Sarah Kelley on 01/05/21 at  2:00 PM EDT by telephone and verified that I am speaking with the correct person using two identifiers.  Location: Patient: At home  Provider: [facility] ([place])   I discussed the limitations, risks, security and privacy concerns of performing an evaluation and management service by telephone and the availability of in person appointments. I also discussed with the patient that there may be a patient responsible charge related to this service. The patient expressed understanding and agreed to proceed.   History of Present Illness: Son is present and presents history due to patient current condition.  States that she is harding a difficult time sleeping on occasion, one to two times a week.  Has not tried anything for relief.  Does endorse that the day she has difficulty sleeping she is sleeping more during the day.  Reports patient is living with him now.  States that she complains of stomach pain on a constant basis.  States that he does not believe it is related to anything she is eating, states that the omeprazole does seem to offer some relief  Reports that she continues to go to dialysis on Tuesday Thursdays and Saturdays.  Due to language barrier, an interpreter was present during the history-taking and subsequent discussion (and for part of the physical exam) with this patient.    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;  Surgeon: Newt Minion, MD;  Location: [place];  Service: Orthopedics;  Laterality: Left;   AV FISTULA PLACEMENT Left 03/28/2016   Procedure: ARTERIOVENOUS (AV) FISTULA CREATION LEFT ARM;  Surgeon: Waynetta Sandy, MD;  Location: [place];  Service: Vascular;  Laterality: Left;   AV FISTULA PLACEMENT Right 06/04/2019   Procedure: ARTERIOVENOUS (AV) FISTULA CREATION RIGHT ARM;  Surgeon: Waynetta Sandy, MD;  Location: [place];  Service: Vascular;  Laterality: Right;   DILATION AND CURETTAGE OF UTERUS     EYE SURGERY     FISTULA SUPERFICIALIZATION Left 09/04/2016   Procedure: FISTULA SUPERFICIALIZATION LEFT UPPER ARM;  Surgeon: Waynetta Sandy, MD;  Location: [place];  Service: Vascular;  Laterality: Left;   FISTULA SUPERFICIALIZATION Right 10/08/2019   Procedure: FISTULA SUPERFICIALIZATION OR RIGHT ARM;  Surgeon: Waynetta Sandy, MD;  Location: [place];  Service: Vascular;  Laterality: Right;   IR AV DIALY SHUNT INTRO NEEDLE/INTRAC INITIAL W/PTA/STENT/IMG LT Left 06/13/2017   IR AV DIALY SHUNT INTRO NEEDLE/INTRAC INITIAL W/PTA/STENT/IMG LT Left 09/12/2017   IR AV DIALY SHUNT INTRO NEEDLE/INTRACATH INITIAL W/PTA/IMG LEFT  11/10/2016  IR AV DIALY SHUNT INTRO NEEDLE/INTRACATH INITIAL W/PTA/IMG LEFT  03/09/2017   IR AV DIALY SHUNT INTRO NEEDLE/INTRACATH INITIAL W/PTA/IMG LEFT  12/12/2017   IR AV DIALY SHUNT INTRO NEEDLE/INTRACATH INITIAL W/PTA/IMG LEFT  02/27/2018   IR AV DIALY SHUNT INTRO NEEDLE/INTRACATH INITIAL W/PTA/IMG LEFT  06/05/2018   IR AV DIALY SHUNT INTRO NEEDLE/INTRACATH INITIAL W/PTA/IMG LEFT  09/11/2018   IR AV DIALY SHUNT INTRO NEEDLE/INTRACATH INITIAL W/PTA/IMG RIGHT Right 04/21/2020    IR AV DIALY SHUNT INTRO NEEDLE/INTRACATH INITIAL W/PTA/IMG RIGHT Right 10/08/2020   IR DIALY SHUNT INTRO NEEDLE/INTRACATH INITIAL W/IMG LEFT Left 05/08/2018   IR FLUORO GUIDE CV LINE RIGHT  02/01/2019   IR FLUORO GUIDE CV LINE RIGHT  07/14/2020   IR FLUORO GUIDE CV LINE RIGHT  12/29/2020   IR GENERIC HISTORICAL  08/01/2016   IR FLUORO GUIDE CV LINE RIGHT 08/01/2016 [name], MD [facility]   IR GENERIC HISTORICAL  08/01/2016   IR US GUIDE VASC ACCESS RIGHT 08/01/2016 [name], MD [facility]   IR REMOVAL TUN CV CATH W/O FL  11/01/2016   IR REMOVAL TUN CV CATH W/O FL  01/02/2020   IR THROMBECTOMY AV FISTULA W/THROMBOLYSIS INC/SHUNT/IMG LEFT Left 01/31/2019   IR THROMBECTOMY AV FISTULA W/THROMBOLYSIS/PTA INC/SHUNT/IMG LEFT Left 01/27/2019   IR THROMBECTOMY AV FISTULA W/THROMBOLYSIS/PTA INC/SHUNT/IMG LEFT Left 07/16/2020   IR THROMBECTOMY AV FISTULA W/THROMBOLYSIS/PTA/STENT INC/SHUNT/IMG RT Right 09/24/2020   IR US GUIDE VASC ACCESS LEFT  11/10/2016   IR US GUIDE VASC ACCESS LEFT  09/12/2017   IR US GUIDE VASC ACCESS LEFT  12/12/2017   IR US GUIDE VASC ACCESS LEFT  06/05/2018   IR US GUIDE VASC ACCESS LEFT  09/11/2018   IR US GUIDE VASC ACCESS LEFT  01/27/2019   IR US GUIDE VASC ACCESS LEFT  02/01/2019   IR US GUIDE VASC ACCESS RIGHT  02/01/2019   IR US GUIDE VASC ACCESS RIGHT  07/14/2020   IR US GUIDE VASC ACCESS RIGHT  07/16/2020   IR US GUIDE VASC ACCESS RIGHT  10/08/2020   IR US GUIDE VASC ACCESS RIGHT  12/29/2020    History reviewed. No pertinent family history.  Social History   Socioeconomic History   Marital status: Widowed    Spouse name: Not on file   Number of children: 11   Years of education: no schooling   Highest education level: Not on file  Occupational History   Not on file  Tobacco Use   Smoking status: Never   Smokeless tobacco: Never  Vaping Use   Vaping Use: Never used  Substance and Sexual Activity   Alcohol use: Not Currently    Alcohol/week: 0.0 standard drinks     Comment: rare- a beer every now and then   Drug use: No   Sexual activity: Not Currently  Other Topics Concern   Not on file  Social History Narrative   Lives with son Sarah Kelley    Right handed   Caffeine: every once in a while a little coffee, chamomile tea, or soda   Social Determinants of Health   Financial Resource Strain: Not on file  Food Insecurity: Not on file  Transportation Needs: Not on file  Physical Activity: Not on file  Stress: Not on file  Social Connections: Not on file  Intimate Partner Violence: Not on file    Outpatient Medications Prior to Visit  Medication Sig Dispense Refill   Amino Acids-Protein Hydrolys (FEEDING SUPPLEMENT, PRO-STAT SUGAR FREE 64,) LIQD Take 30 mLs by mouth  2 (two) times daily. (Patient not taking: No sig reported) 900 mL 0   atorvastatin (LIPITOR) 40 MG tablet TAKE 1 TABLET (40 MG TOTAL) BY MOUTH DAILY. 30 tablet 6   B Complex-C-Zn-Folic Acid (DIALYVITE Q000111Q WITH ZINC) 0.8 MG TABS Take 1 tablet by mouth daily.     Blood Glucose Monitoring Suppl (TRUE METRIX METER) DEVI 1 each by Does not apply route 3 (three) times daily before meals. 1 Device 0   cetirizine (ZYRTEC) 10 MG tablet Take 1 tablet (10 mg total) by mouth daily. 30 tablet 1   cinacalcet (SENSIPAR) 60 MG tablet Take 60 mg by mouth daily.     doxercalciferol (HECTOROL) 0.5 MCG capsule Take 0.5 mcg by mouth 3 (three) times a week.     DULoxetine (CYMBALTA) 60 MG capsule TAKE 1 CAPSULE (60 MG TOTAL) BY MOUTH DAILY. 30 capsule 6   ethyl chloride spray SPRAY 1 SPRAY TO THE SKIN 3 TIMES A WEEK AS NEEDED. SPRAY A SMALL AMOUNT JUST PRIOR TO NEEDLE INSERTION. 116 mL 0   gabapentin (NEURONTIN) 300 MG capsule TAKE 1 CAPSULE (300 MG TOTAL) BY MOUTH AT BEDTIME. 30 capsule 2   glucose blood (TRUE METRIX BLOOD GLUCOSE TEST) test strip Use 3 times daily before meals 100 each 3   insulin glargine (LANTUS) 100 UNIT/ML injection INJECT SUBCUTANEOUSLY TWICE DAILY 15 UNITS IN THE MORNING AND 10 IN THE  EVENING 30 mL 6   Insulin Syringe-Needle U-100 (TRUEPLUS INSULIN SYRINGE) 31G X 5/16" 1 ML MISC USE AS DIRECTED 100 each 5   lisinopril (ZESTRIL) 20 MG tablet TAKE 1 TABLET (20 MG TOTAL) BY MOUTH DAILY. 30 tablet 6   methocarbamol (ROBAXIN) 500 MG tablet TAKE 1 TABLET (500 MG TOTAL) BY MOUTH 2 (TWO) TIMES DAILY AS NEEDED FOR MUSCLE SPASMS. 30 tablet 2   Methoxy PEG-Epoetin Beta (MIRCERA IJ) Mircera     mupirocin ointment (BACTROBAN) 2 % APPLY 1 APPLICATION TOPICALLY 2 (TWO) TIMES DAILY. 22 g 2   Nutritional Supplements (FEEDING SUPPLEMENT, NEPRO CARB STEADY,) LIQD Take 237 mLs by mouth Every Tuesday,Thursday,and Saturday with dialysis.      sucroferric oxyhydroxide (VELPHORO) 500 MG chewable tablet Chew 500 mg by mouth 2 (two) times daily with a meal.      TRUEplus Lancets 28G MISC USE AS DIRECTED THREE TIMES DAILY TO TEST SUGAR BEFORE MEALS (Patient taking differently: 1 each by Other route 3 (three) times daily.) 100 each 12   omeprazole (PRILOSEC) 20 MG capsule TAKE 1 CAPSULE (20 MG TOTAL) BY MOUTH DAILY. 30 capsule 3   Facility-Administered Medications Prior to Visit  Medication Dose Route Frequency Provider Last Rate Last Admin   0.9 %  sodium chloride infusion   Intravenous Continuous Monia Sabal, PA-C        No Known Allergies  ROS Review of Systems  Constitutional:  Negative for chills and fever.  HENT: Negative.    Eyes: Negative.   Respiratory:  Negative for shortness of breath.   Cardiovascular:  Negative for chest pain.  Gastrointestinal:  Positive for abdominal pain. Negative for constipation, diarrhea, nausea and vomiting.  Endocrine: Negative.   Genitourinary: Negative.   Musculoskeletal: Negative.   Skin: Negative.   Allergic/Immunologic: Negative.   Neurological: Negative.   Hematological: Negative.   Psychiatric/Behavioral:  Positive for sleep disturbance.      Objective:      LMP  (LMP Unknown)  Wt Readings from Last 3 Encounters:  12/29/20 140 lb (63.5  kg)  10/08/20 133 lb 6.1  oz (60.5 kg)  09/24/20 133 lb 6.1 oz (60.5 kg)     Health Maintenance Due  Topic Date Due   OPHTHALMOLOGY EXAM  Never done   Zoster Vaccines- Shingrix (1 of 2) Never done   COLONOSCOPY (Pts 45-68yr Insurance coverage will need to be confirmed)  Never done   MAMMOGRAM  Never done   DEXA SCAN  Never done   FOOT EXAM  05/12/2020   COVID-19 Vaccine (2 - Pfizer series) 08/19/2020   HEMOGLOBIN A1C  12/09/2020    There are no preventive care reminders to display for this patient.  Lab Results  Component Value Date   TSH 2.316 06/14/2015   Lab Results  Component Value Date   WBC 4.5 10/08/2020   HGB 8.5 (L) 12/29/2020   HCT 25.0 (L) 12/29/2020   MCV 96.5 10/08/2020   PLT 153 10/08/2020   Lab Results  Component Value Date   NA 134 (L) 12/29/2020   K 6.4 (HH) 12/29/2020   CO2 33 (H) 10/08/2020   GLUCOSE 64 (L) 12/29/2020   BUN >130 (H) 12/29/2020   CREATININE 16.70 (H) 12/29/2020   BILITOT 0.3 06/11/2020   ALKPHOS 177 (H) 06/11/2020   AST 17 06/11/2020   ALT 15 06/11/2020   PROT 6.7 06/11/2020   ALBUMIN 2.7 (L) 07/15/2020   CALCIUM 9.5 10/08/2020   ANIONGAP 7 10/08/2020   Lab Results  Component Value Date   CHOL 128 06/11/2020   Lab Results  Component Value Date   HDL 37 (L) 06/11/2020   Lab Results  Component Value Date   LDLCALC 74 06/11/2020   Lab Results  Component Value Date   TRIG 89 06/11/2020   Lab Results  Component Value Date   CHOLHDL 3.5 06/11/2020   Lab Results  Component Value Date   HGBA1C 8.0 (A) 06/10/2020      Assessment & Plan:   Problem List Items Addressed This Visit       Digestive   Dysphagia   Relevant Medications   omeprazole (PRILOSEC) 20 MG capsule     Other   Generalized abdominal pain   Insomnia - Primary    Meds ordered this encounter  Medications   omeprazole (PRILOSEC) 20 MG capsule    Sig: TAKE 1 CAPSULE (20 MG TOTAL) BY MOUTH DAILY.    Dispense:  30 capsule    Refill:  3     Order Specific Question:   Supervising Provider    Answer:   WAsencion NobleE [[id number]]   Assessment and Plan: 1. Insomnia, unspecified type Patient education given on good sleep hygiene, encouraged avoiding napping during the day.  2. Generalized abdominal pain Patient encouraged to continue omeprazole.  Patient was encouraged to be seen in person, explained that this type of pain was not treatable for a virtual visit.  Patient requested appointment with primary care provider.  Patient was encouraged to present to mobile medicine unit if abdominal pain continued.  Red flags given for prompt reevaluation.  3. Dysphagia, unspecified type Continue current regimen - omeprazole (PRILOSEC) 20 MG capsule; TAKE 1 CAPSULE (20 MG TOTAL) BY MOUTH DAILY.  Dispense: 30 capsule; Refill: 3   Follow Up Instructions:    I discussed the assessment and treatment plan with the patient. The patient was provided an opportunity to ask questions and all were answered. The patient agreed with the plan and demonstrated an understanding of the instructions.   The patient was advised to call back or seek an in-person evaluation  if the symptoms worsen or if the condition fails to improve as anticipated.  I provided 21 minutes of non-face-to-face time during this encounter.   Jobani Sabado S Mayers, PA-C  Follow-up: Return in about 10 weeks (around 03/16/2021).    Loraine Grip Mayers, PA-C

## 2021-01-05 NOTE — Progress Notes (Signed)
Patient's son verified DOB Patients son prefers to complete the visit for the patient. Patients son states she has been living with them for some time and does not want to be at the house she is at currently. Patient has not been sleeping since she has been there.

## 2021-01-06 ENCOUNTER — Encounter: Payer: Self-pay | Admitting: Physician Assistant

## 2021-01-06 NOTE — Patient Instructions (Signed)
No AVS created, patient declines my chart

## 2021-01-07 ENCOUNTER — Other Ambulatory Visit: Payer: Self-pay

## 2021-01-13 ENCOUNTER — Encounter (INDEPENDENT_AMBULATORY_CARE_PROVIDER_SITE_OTHER): Payer: Self-pay

## 2021-01-13 ENCOUNTER — Other Ambulatory Visit: Payer: Self-pay

## 2021-01-13 MED FILL — Insulin Glargine Inj 100 Unit/ML: SUBCUTANEOUS | 28 days supply | Qty: 10 | Fill #2 | Status: AC

## 2021-01-14 ENCOUNTER — Other Ambulatory Visit: Payer: Self-pay

## 2021-01-14 MED FILL — Duloxetine HCl Enteric Coated Pellets Cap 60 MG (Base Eq): ORAL | 30 days supply | Qty: 30 | Fill #2 | Status: AC

## 2021-01-17 ENCOUNTER — Other Ambulatory Visit: Payer: Self-pay

## 2021-01-19 ENCOUNTER — Other Ambulatory Visit: Payer: Self-pay

## 2021-01-28 ENCOUNTER — Other Ambulatory Visit: Payer: Self-pay

## 2021-01-28 ENCOUNTER — Other Ambulatory Visit: Payer: Self-pay | Admitting: Physician Assistant

## 2021-01-28 DIAGNOSIS — E1122 Type 2 diabetes mellitus with diabetic chronic kidney disease: Secondary | ICD-10-CM

## 2021-01-28 DIAGNOSIS — Z794 Long term (current) use of insulin: Secondary | ICD-10-CM

## 2021-01-28 MED ORDER — ATORVASTATIN CALCIUM 40 MG PO TABS
ORAL_TABLET | Freq: Every day | ORAL | 0 refills | Status: DC
  Filled 2021-01-28: qty 30, 30d supply, fill #0

## 2021-01-28 MED FILL — Lisinopril Tab 20 MG: ORAL | 30 days supply | Qty: 30 | Fill #3 | Status: AC

## 2021-01-31 ENCOUNTER — Other Ambulatory Visit: Payer: Self-pay

## 2021-02-01 ENCOUNTER — Other Ambulatory Visit: Payer: Self-pay

## 2021-02-03 ENCOUNTER — Other Ambulatory Visit: Payer: Self-pay

## 2021-02-10 ENCOUNTER — Other Ambulatory Visit: Payer: Self-pay

## 2021-02-10 MED FILL — Duloxetine HCl Enteric Coated Pellets Cap 60 MG (Base Eq): ORAL | 30 days supply | Qty: 30 | Fill #3 | Status: CN

## 2021-02-11 ENCOUNTER — Other Ambulatory Visit: Payer: Self-pay | Admitting: *Deleted

## 2021-02-11 DIAGNOSIS — N186 End stage renal disease: Secondary | ICD-10-CM

## 2021-02-14 ENCOUNTER — Ambulatory Visit: Payer: Self-pay | Attending: Family Medicine | Admitting: Family Medicine

## 2021-02-14 ENCOUNTER — Other Ambulatory Visit: Payer: Self-pay

## 2021-02-14 ENCOUNTER — Encounter: Payer: Self-pay | Admitting: Family Medicine

## 2021-02-14 VITALS — BP 163/73 | HR 64

## 2021-02-14 DIAGNOSIS — R197 Diarrhea, unspecified: Secondary | ICD-10-CM

## 2021-02-14 DIAGNOSIS — R531 Weakness: Secondary | ICD-10-CM

## 2021-02-14 DIAGNOSIS — N186 End stage renal disease: Secondary | ICD-10-CM

## 2021-02-14 DIAGNOSIS — E1122 Type 2 diabetes mellitus with diabetic chronic kidney disease: Secondary | ICD-10-CM

## 2021-02-14 DIAGNOSIS — Z1231 Encounter for screening mammogram for malignant neoplasm of breast: Secondary | ICD-10-CM

## 2021-02-14 DIAGNOSIS — Z1211 Encounter for screening for malignant neoplasm of colon: Secondary | ICD-10-CM

## 2021-02-14 DIAGNOSIS — I152 Hypertension secondary to endocrine disorders: Secondary | ICD-10-CM

## 2021-02-14 DIAGNOSIS — Z794 Long term (current) use of insulin: Secondary | ICD-10-CM

## 2021-02-14 DIAGNOSIS — E1159 Type 2 diabetes mellitus with other circulatory complications: Secondary | ICD-10-CM

## 2021-02-14 DIAGNOSIS — Z992 Dependence on renal dialysis: Secondary | ICD-10-CM

## 2021-02-14 LAB — POCT GLYCOSYLATED HEMOGLOBIN (HGB A1C): HbA1c, POC (controlled diabetic range): 8.3 % — AB (ref 0.0–7.0)

## 2021-02-14 LAB — GLUCOSE, POCT (MANUAL RESULT ENTRY)
POC Glucose: 48 mg/dl — AB (ref 70–99)
POC Glucose: 89 mg/dl (ref 70–99)

## 2021-02-14 MED ORDER — ATORVASTATIN CALCIUM 40 MG PO TABS
ORAL_TABLET | Freq: Every day | ORAL | 6 refills | Status: DC
  Filled 2021-02-14: qty 30, fill #0
  Filled 2021-02-18: qty 30, 30d supply, fill #0
  Filled 2021-04-01: qty 30, 30d supply, fill #1
  Filled 2021-05-09: qty 30, 30d supply, fill #2
  Filled 2021-06-09: qty 30, 30d supply, fill #3
  Filled 2021-07-08: qty 30, 30d supply, fill #0
  Filled 2021-08-11: qty 30, 30d supply, fill #1
  Filled 2021-09-12: qty 30, 30d supply, fill #2
  Filled 2021-09-12: qty 30, 30d supply, fill #0
  Filled 2021-09-12: qty 30, 30d supply, fill #2

## 2021-02-14 MED ORDER — TRUEPLUS LANCETS 28G MISC
1.0000 | Freq: Three times a day (TID) | 11 refills | Status: DC
  Filled 2021-02-14 – 2021-11-16 (×2): qty 100, 30d supply, fill #0

## 2021-02-14 MED ORDER — DIPHENOXYLATE-ATROPINE 2.5-0.025 MG PO TABS: 1.0000 | ORAL_TABLET | Freq: Three times a day (TID) | ORAL | 0 refills | Status: DC | PRN

## 2021-02-14 MED ORDER — DIPHENOXYLATE-ATROPINE 2.5-0.025 MG PO TABS
1.0000 | ORAL_TABLET | Freq: Three times a day (TID) | ORAL | 0 refills | Status: DC | PRN
  Filled 2021-02-14: qty 30, 10d supply, fill #0

## 2021-02-14 MED FILL — Duloxetine HCl Enteric Coated Pellets Cap 60 MG (Base Eq): ORAL | 30 days supply | Qty: 30 | Fill #3 | Status: AC

## 2021-02-14 NOTE — Progress Notes (Signed)
Company states that he fills her pill box and knocked it over by mistake.

## 2021-02-14 NOTE — Addendum Note (Signed)
Addended by: Gomez Cleverly on: 02/14/2021 11:04 AM   Modules accepted: Orders

## 2021-02-14 NOTE — Patient Instructions (Signed)
Hipoglucemia Hypoglycemia La hipoglucemia ocurre cuando el nivel de azcar (glucosa) en la sangre es demasiado bajo. La hipoglucemia puede suceder en personas con o sin diabetes. Puede desarrollarse rpidamente y ser una emergencia mdica. En la mayora de las personas, un nivel de glucemia inferior a 70 mg/dl (3.9 mmol/l) se considera hipoglucemia. La glucosa es un tipo de azcar que constituye la principal fuente de energa del cuerpo. Determinadas hormonas, como la insulina y el glucagn, controlan el nivel de glucosa en la sangre. La insulina baja la glucemia, mientras que el glucagn la aumenta. Si tiene demasiada insulina en el torrente sanguneo o si no ingiere la cantidad suficiente de alimentos que contengan glucosa, puede tener hipoglucemia. Adems, puede tener hipoglucemia reactiva, que ocurre en el lapso de 4 horas despus de haber comido. Cules son las causas? La hipoglucemia ocurre con ms frecuencia en las personas que tienen diabetes y puede deberse a lo siguiente: Los medicamentos para la diabetes. No comer lo suficiente o no comer con la frecuencia suficiente. Un aumento de la actividad fsica. Beber alcohol con el estmago vaco. Si no tiene diabetes, las causas de la hipoglucemia pueden deberse a lo siguiente: Un tumor en el pncreas. No comer lo suficiente o no comer durante perodos largos (ayunar). Una infeccin o enfermedad grave. Problemas despus de someterse a una ciruga baritrica. Insuficiencia orgnica, como insuficiencia renal o heptica. Ciertos medicamentos. Qu incrementa el riesgo? Es ms probable que la hipoglucemia se manifieste en las personas que: Tienen diabetes y toman medicamentos para bajar la glucemia. Consumen alcohol en exceso. Tienen una enfermedad grave. Cules son los signos o sntomas? Los sntomas varan; esto depende de que la afeccin sea leve, moderada o grave. Hipoglucemia leve Hamb[place]. Sudoracin y piel hmeda. Mareos o sensacin  de desvanecimiento. Somnolencia o sueo agitado. Nuseas. Aumento de la frecuencia cardaca. Dolor de cabeza. Visin borrosa. Cambios en el estado de nimo, como irritabilidad o ansiedad. Hormigueo o adormecimiento alrededor de la boca, labios o lengua. Hipoglucemia moderada Confusin y deterioro del sentido de la realidad. Cambios en el comportamiento. Debilidad. Latidos cardacos irregulares. Cambios en la coordinacin. Hipoglucemia grave La hipoglucemia grave es una emergencia mdica. Puede ocasionar: Desmayos. Convulsiones. Prdida de la conciencia (estado de coma). Muerte. Cmo se diagnostica? La hipoglucemia se diagnostica con un anlisis de sangre para medir la glucemia. Este anlisis de sangre se realiza cuando tiene sntomas. El mdico tambin puede hacerle un examen fsico y revisar sus antecedentes mdicos. Cmo se trata? El tratamiento de esta afeccin consiste en ingerir de inmediato un alimento o una bebida que contenga azcar con 15 gramos de hidratos de carbono de accin rpida, por ejemplo: 4 onzas (120 ml) de jugo de frutas. 4 onzas (120 ml) de un refresco comn (no diettico). Varios caramelos duros. Lea las etiquetas de informacin nutricional a fin de saber cuntas unidades consumir para obtener 15 gramos. 1 cucharada (15 ml) de azcar o miel. 4 tabletas de glucosa. 1 pomo de glucosa en gel. Tratamiento de la hipoglucemia en las personas con diabetes Si est alerta y puede tragar de manera segura, siga la regla 15/15, que consiste en lo siguiente: Consuma 15 gramos de un hidrato de carbono de accin rpida. Hable con su mdico acerca de cunto debera consumir. Las opciones para obtener 15 gramos de hidratos de carbono de accin rpida incluyen las siguientes: Comprimidos de glucosa (tome 4 comprimidos). Varios caramelos duros. Lea las etiquetas de informacin nutricional a fin de saber cuntas unidades consumir para obtener 15 gramos. 4   de jugo de  frutas. 4 onzas (120 ml) de un refresco comn (no diettico). 1 cucharada (15 ml) de azcar o miel. 1 pomo de glucosa en gel. Contrlese la glucemia 15 minutos despus de ingerir el hidrato de carbono. Si este nuevo nivel de glucemia todava es igual o menor que 70 mg/dl (3.9 mmol/l), ingiera nuevamente 15 gramos de un hidrato de carbono. Si el nivel de glucemia no supera los 70 mg/dl (3.9 mmol/l) despus de 3 intentos, solicite ayuda de inmediato. Ingiera una comida o una colacin en el transcurso de 1 hora despus de que su nivel de glucemia se haya normalizado.  Tratamiento de la hipoglucemia grave La hipoglucemia se considera grave cuando su nivel de glucemia es menor que 54 mg/dl (3 mmol/l). La hipoglucemia grave es Engineering geologist. Solicite atencin mdica de inmediato. Si tiene hipoglucemia grave y no puede ingerir ningn alimento ni bebida, ser necesario que le administren glucagn. Un familiar o un amigo debe aprender a controlarle el nivel de glucemia y a darle glucagn. Pregntele al mdico sidebera tener disponible un kit de glucagn de Freight forwarder. Es posible que la hipoglucemia grave deba tratarse en un hospital. El tratamiento puede incluir la administracin de glucosa a travs de una va intravenosa. Tambin puede necesitar un tratamiento para tratar la afeccin queest causando la hipoglucemia. Siga estas instrucciones en su casa:  Indicaciones generales Use los medicamentos de venta libre y los recetados solamente como se lo haya indicado el mdico. Contrlese la glucemia como se lo haya indicado el mdico. Si bebe alcohol: Limite la cantidad que bebe a lo siguiente: De 0 a 1 medida por da para las mujeres que no estn embarazadas. De 0 a 2 medidas por da para los hombres. Sepa cunta cantidad de alcohol hay en las bebidas que toma. En los [place], una medida equivale a una botella de cerveza de 12 oz (355 ml), un vaso de vino de 5 oz (148 ml) o un vaso de una  bebida alcohlica de alta graduacin de 1 oz (44 ml). Asegrese de comer alimentos cuando bebe alcohol. Tenga en cuenta que el alcohol se absorbe rpidamente y puede tener efectos persistentes que ms tarde pueden provocar hipoglucemia. Asegrese de Optometrist un control continuo de la glucosa. Concurra a [place]. Esto es importante. Si usted tiene diabetes: Lleve siempre con usted una opcin de hidratos de carbono de accin rpida (15 gramos) para tratar la glucemia baja. Siga el plan de control de la diabetes como se lo haya indicado el mdico. Asegrese de hacer lo siguiente: Conozca los sntomas de la hipoglucemia. Es importante tratarla de inmediato para evitar que empeore. Controle su nivel de glucemia con la frecuencia que le hayan indicado. Contrlelo siempre antes y despus de Engineer, site. Contrlese siempre la glucemia antes de conducir un vehculo motorizado. Tome los medicamentos segn las indicaciones. Siga su plan de comidas. Coma a horario y no se saltee ninguna comida. Comparta su plan de control de la diabetes con sus compaeros de trabajo y de Cytogeneticist, y con las otras personas que viven en su hogar. Lleve una tarjeta de alerta mdica o use un brazalete o medalla de alerta mdica. Dnde buscar ms informacin American Diabetes Association (Asociacin [place] de la Diabetes): www.diabetes.org Comunquese con un mdico si: Tiene problemas para mantener la glucemia dentro del rango ideal. Tiene episodios frecuentes de hipoglucemia. Solicite ayuda de inmediato si: Contina teniendo sntomas de hipoglucemia despus de comer o beber algo que [employer]  15 gramos de hidratos de carbono de accin rpida y no puede hacer que su glucemia supere los 70 mg/dl (3.9 mmol/l) siguiendo la regla 15/15. Su nivel de glucemia est por debajo de 54 mg/dl (3 mmol/l). Tiene una convulsin. Se desmaya. Estos sntomas pueden representar un problema grave que  constituye Engineer, maintenance (IT). No espere a ver si los sntomas desaparecen. Solicite atencin mdica de inmediato. Comunquese con el servicio de emergencias de su localidad (911 en los [place]). No conduzca por sus propios medios [employer] hospital. Resumen La hipoglucemia ocurre cuando el nivel de azcar (glucosa) en la sangre es demasiado bajo. La hipoglucemia puede suceder en personas con o sin diabetes. Puede desarrollarse rpidamente y ser una emergencia mdica. Asegrese de [employer] sntomas de la hipoglucemia y cmo tratarla. Lleve siempre con usted una opcin de hidratos de carbono de accin rpida para tratar el nivel bajo de azcar en la sangre. Esta informacin no tiene [name] el consejo del mdico. Asegresede hacerle al mdico cualquier pregunta que tenga. Document Revised: 06/14/2020 Document Reviewed: 06/14/2020 Elsevier Patient Education  2022 [name].

## 2021-02-14 NOTE — Progress Notes (Signed)
Subjective:  Patient ID: Sarah Kelley, female    DOB: 1951-03-09  Age: 70 y.o. MRN: [id number]  CC: Diabetes   HPI Sarah Kelley is a 70 y.o. 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 Seen with the aid of a Spanish interpreter and history is obtained from both the patient and her son.  Interval History: She was given apple juice due to hypoglycemia of 48 in the Clinic and she threw up the juice. Per son this is too early for her to eat. Son states she sometimes has diarrhea which could last 2-3 days then resolve. She did have an episode this morning.  Endorses compliance with her Lantus and takes 18 units in the morning and 8 to 10 units at bedtime.  She has not had hypoglycemic episodes at home. Hemodialysis sessions are going well. Son is concerned that she is weak in her lower extremities.  Past Medical History:  Diagnosis Date   Diabetes mellitus    ESRD (end stage renal disease) ([place])    dialysis   Hyperlipidemia    Hypertension    Iron deficiency anemia    MRSA infection    hx of   Neuropathy    Diabetic   Pneumonia 07/2016   Renal disorder    Seizure-like activity ([place])    Sepsis ([place])    Thyroid disease    Wears dentures     Past Surgical History:  Procedure Laterality Date   AMPUTATION Left 05/21/2015   Procedure: Left Foot 3rd Ray Amputation;  Surgeon: Newt Minion, MD;  Location: [place];  Service: Orthopedics;  Laterality: Left;   AV FISTULA PLACEMENT Left 03/28/2016   Procedure: ARTERIOVENOUS (AV) FISTULA CREATION LEFT ARM;  Surgeon: Waynetta Sandy, MD;  Location: [place];  Service: Vascular;  Laterality: Left;   AV FISTULA PLACEMENT Right 06/04/2019   Procedure: ARTERIOVENOUS (AV) FISTULA CREATION RIGHT ARM;  Surgeon: Waynetta Sandy, MD;  Location: [facility];  Service: Vascular;  Laterality: Right;   DILATION AND CURETTAGE OF UTERUS     EYE SURGERY     FISTULA SUPERFICIALIZATION Left 09/04/2016   Procedure: FISTULA SUPERFICIALIZATION LEFT UPPER ARM;  Surgeon: Waynetta Sandy, MD;  Location: [facility];  Service: Vascular;  Laterality: Left;   FISTULA SUPERFICIALIZATION Right 10/08/2019   Procedure: FISTULA SUPERFICIALIZATION OR RIGHT ARM;  Surgeon: Waynetta Sandy, MD;  Location: [place];  Service: Vascular;  Laterality: Right;   IR AV DIALY SHUNT INTRO NEEDLE/INTRAC INITIAL W/PTA/STENT/IMG LT Left 06/13/2017   IR AV DIALY SHUNT INTRO NEEDLE/INTRAC INITIAL W/PTA/STENT/IMG LT Left 09/12/2017   IR AV DIALY SHUNT INTRO NEEDLE/INTRACATH INITIAL W/PTA/IMG LEFT  11/10/2016   IR AV DIALY SHUNT INTRO NEEDLE/INTRACATH INITIAL W/PTA/IMG LEFT  03/09/2017   IR AV DIALY SHUNT INTRO NEEDLE/INTRACATH INITIAL W/PTA/IMG LEFT  12/12/2017   IR AV DIALY SHUNT INTRO NEEDLE/INTRACATH INITIAL W/PTA/IMG LEFT  02/27/2018   IR AV DIALY SHUNT INTRO NEEDLE/INTRACATH INITIAL W/PTA/IMG LEFT  06/05/2018   IR AV DIALY SHUNT INTRO NEEDLE/INTRACATH INITIAL W/PTA/IMG LEFT  09/11/2018   IR AV DIALY SHUNT INTRO NEEDLE/INTRACATH INITIAL W/PTA/IMG RIGHT Right 04/21/2020   IR AV DIALY SHUNT INTRO NEEDLE/INTRACATH INITIAL W/PTA/IMG RIGHT Right 10/08/2020   IR DIALY SHUNT INTRO NEEDLE/INTRACATH INITIAL W/IMG LEFT Left 05/08/2018   IR FLUORO GUIDE CV LINE RIGHT  02/01/2019   IR FLUORO GUIDE  CV LINE RIGHT  07/14/2020   IR FLUORO GUIDE CV LINE RIGHT  12/29/2020   IR GENERIC HISTORICAL  08/01/2016   IR FLUORO GUIDE CV LINE RIGHT 08/01/2016 [name], MD [facility]   IR GENERIC HISTORICAL  08/01/2016   IR US GUIDE VASC ACCESS RIGHT 08/01/2016 [name], MD [facility]   IR REMOVAL TUN CV CATH W/O FL  11/01/2016   IR REMOVAL TUN CV CATH W/O FL  01/02/2020   IR THROMBECTOMY AV FISTULA W/THROMBOLYSIS INC/SHUNT/IMG LEFT Left 01/31/2019   IR THROMBECTOMY AV FISTULA  W/THROMBOLYSIS/PTA INC/SHUNT/IMG LEFT Left 01/27/2019   IR THROMBECTOMY AV FISTULA W/THROMBOLYSIS/PTA INC/SHUNT/IMG LEFT Left 07/16/2020   IR THROMBECTOMY AV FISTULA W/THROMBOLYSIS/PTA/STENT INC/SHUNT/IMG RT Right 09/24/2020   IR US GUIDE VASC ACCESS LEFT  11/10/2016   IR US GUIDE VASC ACCESS LEFT  09/12/2017   IR US GUIDE VASC ACCESS LEFT  12/12/2017   IR US GUIDE VASC ACCESS LEFT  06/05/2018   IR US GUIDE VASC ACCESS LEFT  09/11/2018   IR US GUIDE VASC ACCESS LEFT  01/27/2019   IR US GUIDE VASC ACCESS LEFT  02/01/2019   IR US GUIDE VASC ACCESS RIGHT  02/01/2019   IR US GUIDE VASC ACCESS RIGHT  07/14/2020   IR US GUIDE VASC ACCESS RIGHT  07/16/2020   IR US GUIDE VASC ACCESS RIGHT  10/08/2020   IR US GUIDE VASC ACCESS RIGHT  12/29/2020    No family history on file.  No Known Allergies  Outpatient Medications Prior to Visit  Medication Sig Dispense Refill   Amino Acids-Protein Hydrolys (FEEDING SUPPLEMENT, PRO-STAT SUGAR FREE 64,) LIQD Take 30 mLs by mouth 2 (two) times daily. 900 mL 0   B Complex-C-Zn-Folic Acid (DIALYVITE Q000111Q WITH ZINC) 0.8 MG TABS Take 1 tablet by mouth daily.     Blood Glucose Monitoring Suppl (TRUE METRIX METER) DEVI 1 each by Does not apply route 3 (three) times daily before meals. 1 Device 0   cetirizine (ZYRTEC) 10 MG tablet Take 1 tablet (10 mg total) by mouth daily. 30 tablet 1   cinacalcet (SENSIPAR) 60 MG tablet Take 60 mg by mouth daily.     DULoxetine (CYMBALTA) 60 MG capsule TAKE 1 CAPSULE (60 MG TOTAL) BY MOUTH DAILY. 30 capsule 6   ethyl chloride spray SPRAY 1 SPRAY TO THE SKIN 3 TIMES A WEEK AS NEEDED. SPRAY A SMALL AMOUNT JUST PRIOR TO NEEDLE INSERTION. 116 mL 0   gabapentin (NEURONTIN) 300 MG capsule TAKE 1 CAPSULE (300 MG TOTAL) BY MOUTH AT BEDTIME. 30 capsule 2   glucose blood (TRUE METRIX BLOOD GLUCOSE TEST) test strip Use 3 times daily before meals 100 each 3   insulin glargine (LANTUS) 100 UNIT/ML injection INJECT SUBCUTANEOUSLY TWICE DAILY 15 UNITS IN THE  MORNING AND 10 IN THE EVENING 30 mL 6   Insulin Syringe-Needle U-100 (TRUEPLUS INSULIN SYRINGE) 31G X 5/16" 1 ML MISC USE AS DIRECTED 100 each 5   lisinopril (ZESTRIL) 20 MG tablet TAKE 1 TABLET (20 MG TOTAL) BY MOUTH DAILY. 30 tablet 6   methocarbamol (ROBAXIN) 500 MG tablet TAKE 1 TABLET (500 MG TOTAL) BY MOUTH 2 (TWO) TIMES DAILY AS NEEDED FOR MUSCLE SPASMS. 30 tablet 2   mupirocin ointment (BACTROBAN) 2 % APPLY 1 APPLICATION TOPICALLY 2 (TWO) TIMES DAILY. 22 g 2   Nutritional Supplements (FEEDING SUPPLEMENT, NEPRO CARB STEADY,) LIQD Take 237 mLs by mouth Every Tuesday,Thursday,and Saturday with dialysis.      omeprazole (PRILOSEC) 20 MG capsule TAKE  1 CAPSULE (20 MG TOTAL) BY MOUTH DAILY. 30 capsule 3   sucroferric oxyhydroxide (VELPHORO) 500 MG chewable tablet Chew 500 mg by mouth 2 (two) times daily with a meal.      atorvastatin (LIPITOR) 40 MG tablet TAKE 1 TABLET (40 MG TOTAL) BY MOUTH DAILY. 30 tablet 0   TRUEplus Lancets 28G MISC USE AS DIRECTED THREE TIMES DAILY TO TEST SUGAR BEFORE MEALS (Patient taking differently: 1 each by Other route 3 (three) times daily.) 100 each 12   Facility-Administered Medications Prior to Visit  Medication Dose Route Frequency Provider Last Rate Last Admin   0.9 %  sodium chloride infusion   Intravenous Continuous Monia Sabal, PA-C         ROS Review of Systems  Constitutional:  Negative for activity change, appetite change and fatigue.  HENT:  Negative for congestion, sinus pressure and sore throat.   Eyes:  Negative for visual disturbance.  Respiratory:  Negative for cough, chest tightness, shortness of breath and wheezing.   Cardiovascular:  Negative for chest pain and palpitations.  Gastrointestinal:  Positive for diarrhea. Negative for abdominal distention, abdominal pain and constipation.  Endocrine: Negative for polydipsia.  Genitourinary:  Negative for dysuria and frequency.  Musculoskeletal:  Negative for arthralgias and back pain.   Skin:  Negative for rash.  Neurological:  Positive for weakness. Negative for tremors, light-headedness and numbness.  Hematological:  Does not bruise/bleed easily.  Psychiatric/Behavioral:  Negative for agitation and behavioral problems.    Objective:  BP (!) 163/73   Pulse 64   LMP  (LMP Unknown)   SpO2 98%   BP/Weight 02/14/2021 [id number]  Systolic BP [id number] 123456 123456  Diastolic BP 73 47 38  Wt. (Lbs) - 140 133.38  BMI - 24.8 26.05      Physical Exam Constitutional:      Appearance: She is well-developed.     Comments: Sitting comfortably in wheelchair.  Cardiovascular:     Rate and Rhythm: Normal rate.     Heart sounds: Normal heart sounds. No murmur heard. Pulmonary:     Effort: Pulmonary effort is normal.     Breath sounds: Normal breath sounds. No wheezing or rales.     Comments: Right upper chest wall permacath Chest:     Chest wall: No tenderness.  Abdominal:     General: Bowel sounds are normal. There is no distension.     Palpations: Abdomen is soft. There is no mass.     Tenderness: There is no abdominal tenderness.  Musculoskeletal:        General: Normal range of motion.     Right lower leg: No edema.     Left lower leg: No edema.  Neurological:     Mental Status: She is alert. Mental status is at baseline.  Psychiatric:        Mood and Affect: Mood normal.  Diabetic Foot Exam - Simple   Simple Foot Form Diabetic Foot exam was performed with the following findings: Yes 02/14/2021 10:03 AM  Visual Inspection See comments: Yes Sensation Testing Intact to touch and monofilament testing bilaterally: Yes Pulse Check Posterior Tibialis and Dorsalis pulse intact bilaterally: Yes Comments Dystrophic toenails, unable to palpate posterior tibialis and dorsalis pedis bilaterally      CMP Latest Ref Rng & Units 12/29/2020 10/08/2020 09/24/2020  Glucose 70 - 99 mg/dL 64(L) 75 57(L)  BUN 8 - 23 mg/dL >130(H) 25(H) 78(H)  Creatinine 0.44 - 1.00 mg/dL  16.70(H) 5.54(H) 9.60(H)  Sodium 135 - 145 mmol/L 134(L) 139 135  Potassium 3.5 - 5.1 mmol/L 6.4(HH) 4.1 6.5(HH)  Chloride 98 - 111 mmol/L 101 99 99  CO2 22 - 32 mmol/L - 33(H) 28  Calcium 8.9 - 10.3 mg/dL - 9.5 9.6  Total Protein 6.0 - 8.5 g/dL - - -  Total Bilirubin 0.0 - 1.2 mg/dL - - -  Alkaline Phos 44 - 121 IU/L - - -  AST 0 - 40 IU/L - - -  ALT 0 - 32 IU/L - - -    Lipid Panel     Component Value Date/Time   CHOL 128 06/11/2020 1013   TRIG 89 06/11/2020 1013   HDL 37 (L) 06/11/2020 1013   CHOLHDL 3.5 06/11/2020 1013   CHOLHDL 4.9 01/13/2016 1051   VLDL 66 (H) 01/13/2016 1051   LDLCALC 74 06/11/2020 1013    CBC    Component Value Date/Time   WBC 4.5 10/08/2020 0907   RBC 3.67 (L) 10/08/2020 0907   HGB 8.5 (L) 12/29/2020 1102   HGB 9.7 (L) 06/11/2020 1013   HCT 25.0 (L) 12/29/2020 1102   HCT 29.6 (L) 06/11/2020 1013   PLT 153 10/08/2020 0907   PLT 190 06/11/2020 1013   MCV 96.5 10/08/2020 0907   MCV 95 06/11/2020 1013   MCH 30.5 10/08/2020 0907   MCHC 31.6 10/08/2020 0907   RDW 15.0 10/08/2020 0907   RDW 13.4 06/11/2020 1013   LYMPHSABS 1.6 10/08/2020 0907   LYMPHSABS 1.5 06/11/2020 1013   MONOABS 0.5 10/08/2020 0907   EOSABS 0.2 10/08/2020 0907   EOSABS 0.2 06/11/2020 1013   BASOSABS 0.0 10/08/2020 0907   BASOSABS 0.1 06/11/2020 1013    Lab Results  Component Value Date   HGBA1C 8.3 (A) 02/14/2021    Assessment & Plan:  1. Type 2 diabetes mellitus with chronic kidney disease on chronic dialysis, with long-term current use of insulin (HCC) Uncontrolled with A1c of 8.3; goal is less than 8.0 due to multiple comorbidities Hyperglycemia 48 today which improved to 89 after administration of apple juice Will hold off on regimen changes given hypoglycemia today Continue with diabetic diet Counseled on Diabetic diet, my plate method, [id number] minutes of moderate intensity exercise/week Blood sugar logs with fasting goals of 80-120 mg/dl, random of less than 180  and in the event of sugars less than 60 mg/dl or greater than 400 mg/dl encouraged to notify the clinic. Advised on the need for annual eye exams, annual foot exams, Pneumonia vaccine. - POCT glucose (manual entry) - POCT glycosylated hemoglobin (Hb A1C) - atorvastatin (LIPITOR) 40 MG tablet; TAKE 1 TABLET (40 MG TOTAL) BY MOUTH DAILY.  Dispense: 30 tablet; Refill: 6 - POCT glucose (manual entry)  2. Encounter for screening mammogram for malignant neoplasm of breast - MM 3D SCREEN BREAST BILATERAL; Future  3. Screening for colon cancer - Fecal occult blood, imunochemical([facility])  4. Hypertension associated with diabetes ([place]) Uncontrolled We will hold off on adjusting regimen to prevent hypotension during dialysis - TRUEplus Lancets 28G MISC; 1 each by Other route 3 (three) times daily.  Dispense: 100 each; Refill: 11  5. Diarrhea, unspecified type Unknown etiology Given infrequent nature will treat with Lomotil If symptoms persist consider test for pancreatic insufficiency - diphenoxylate-atropine (LOMOTIL) 2.5-0.025 MG tablet; Take 1 tablet by mouth 3 (three) times daily as needed for diarrhea or loose stools.  Dispense: 30 tablet; Refill: 0  6. Weakness Due to deconditioning and prolonged sitting in a wheelchair She would  need PT but unfortunately has no medical coverage and does not have the [facility] financial discount which she has been advised to apply for so referral can be placed.   Health Care Maintenance: We will address additional issues at next visit. Meds ordered this encounter  Medications   atorvastatin (LIPITOR) 40 MG tablet    Sig: TAKE 1 TABLET (40 MG TOTAL) BY MOUTH DAILY.    Dispense:  30 tablet    Refill:  6   DISCONTD: diphenoxylate-atropine (LOMOTIL) 2.5-0.025 MG tablet    Sig: Take 1 tablet by mouth 3 (three) times daily as needed for diarrhea or loose stools.    Dispense:  30 tablet    Refill:  0   TRUEplus Lancets 28G MISC    Sig: 1 each by  Other route 3 (three) times daily.    Dispense:  100 each    Refill:  11   diphenoxylate-atropine (LOMOTIL) 2.5-0.025 MG tablet    Sig: Take 1 tablet by mouth 3 (three) times daily as needed for diarrhea or loose stools.    Dispense:  30 tablet    Refill:  0    Follow-up: Return in about 6 months (around 08/17/2021) for Medical conditions.       [name], MD, FAAFP. [facility] and [place], [place]   02/14/2021, 10:37 AM

## 2021-02-15 LAB — LIPID PANEL
Chol/HDL Ratio: 4 ratio (ref 0.0–4.4)
Cholesterol, Total: 195 mg/dL (ref 100–199)
HDL: 49 mg/dL (ref >39)
LDL Chol Calc (NIH): 124 mg/dL — ABNORMAL HIGH (ref 0–99)
Triglycerides: 125 mg/dL (ref 0–149)
VLDL Cholesterol Cal: 22 mg/dL (ref 5–40)

## 2021-02-17 ENCOUNTER — Other Ambulatory Visit: Payer: Self-pay

## 2021-02-18 MED FILL — Insulin Glargine Inj 100 Unit/ML: SUBCUTANEOUS | 28 days supply | Qty: 10 | Fill #3 | Status: AC

## 2021-02-18 MED FILL — Lisinopril Tab 20 MG: ORAL | 30 days supply | Qty: 30 | Fill #4 | Status: AC

## 2021-02-21 ENCOUNTER — Other Ambulatory Visit: Payer: Self-pay

## 2021-02-25 ENCOUNTER — Ambulatory Visit (HOSPITAL_COMMUNITY)
Admission: RE | Admit: 2021-02-25 | Discharge: 2021-02-25 | Disposition: A | Discharge status: Home / Self Care | Payer: Self-pay | Source: Ambulatory Visit | Attending: Vascular Surgery | Admitting: Vascular Surgery

## 2021-02-25 ENCOUNTER — Encounter: Payer: Self-pay | Admitting: Vascular Surgery

## 2021-02-25 ENCOUNTER — Ambulatory Visit (INDEPENDENT_AMBULATORY_CARE_PROVIDER_SITE_OTHER)
Admission: RE | Admit: 2021-02-25 | Discharge: 2021-02-25 | Disposition: A | Discharge status: Home / Self Care | Payer: Self-pay | Source: Ambulatory Visit | Attending: Vascular Surgery | Admitting: Vascular Surgery

## 2021-02-25 ENCOUNTER — Other Ambulatory Visit: Payer: Self-pay

## 2021-02-25 ENCOUNTER — Ambulatory Visit (INDEPENDENT_AMBULATORY_CARE_PROVIDER_SITE_OTHER): Payer: Self-pay | Admitting: Vascular Surgery

## 2021-02-25 VITALS — BP 128/71 | HR 64 | Temp 97.3°F | Resp 14

## 2021-02-25 DIAGNOSIS — N186 End stage renal disease: Secondary | ICD-10-CM

## 2021-02-25 NOTE — Progress Notes (Signed)
Patient ID: Sarah Kelley, female   DOB: 09-28-1950, 70 y.o.   MRN: [id number]  Reason for Consult: New Patient (Initial Visit) (New access placement/)   Referred by [name], MD  Subjective:     HPI:  Sarah Kelley is a 70 y.o. female history of end-stage renal disease currently dialyzing via catheter.  All information obtained via interpreter and her son is in the room with her.  She has recently failed right upper extremity fistula which is very thin-walled but was able to be cannulated up until approximately 2 months ago according to the son.  Patient is very quiet on exam she only complains of bilateral knee pain today.  She has failed bilateral upper extremity accesses.  She is right-hand dominant  Past Medical History:  Diagnosis Date   Diabetes mellitus    ESRD (end stage renal disease) ([place])    dialysis   Hyperlipidemia    Hypertension    Iron deficiency anemia    MRSA infection    hx of   Neuropathy    Diabetic   Pneumonia 07/2016   Renal disorder    Seizure-like activity ([place])    Sepsis ([place])    Thyroid disease    Wears dentures    No family history on file. Past Surgical History:  Procedure Laterality Date   AMPUTATION Left 05/21/2015   Procedure: Left Foot 3rd Ray Amputation;  Surgeon: Newt Minion, MD;  Location: [place];  Service: Orthopedics;  Laterality: Left;   AV FISTULA PLACEMENT Left 03/28/2016   Procedure: ARTERIOVENOUS (AV) FISTULA CREATION LEFT ARM;  Surgeon: Waynetta Sandy, MD;  Location: [place];  Service: Vascular;  Laterality: Left;   AV FISTULA PLACEMENT Right 06/04/2019   Procedure: ARTERIOVENOUS (AV) FISTULA CREATION RIGHT ARM;  Surgeon: Waynetta Sandy, MD;  Location: [place];  Service: Vascular;  Laterality: Right;   DILATION AND CURETTAGE OF UTERUS     EYE SURGERY     FISTULA SUPERFICIALIZATION Left 09/04/2016   Procedure: FISTULA SUPERFICIALIZATION LEFT UPPER ARM;  Surgeon: Waynetta Sandy,  MD;  Location: [place];  Service: Vascular;  Laterality: Left;   FISTULA SUPERFICIALIZATION Right 10/08/2019   Procedure: FISTULA SUPERFICIALIZATION OR RIGHT ARM;  Surgeon: Waynetta Sandy, MD;  Location: [place];  Service: Vascular;  Laterality: Right;   IR AV DIALY SHUNT INTRO NEEDLE/INTRAC INITIAL W/PTA/STENT/IMG LT Left 06/13/2017   IR AV DIALY SHUNT INTRO NEEDLE/INTRAC INITIAL W/PTA/STENT/IMG LT Left 09/12/2017   IR AV DIALY SHUNT INTRO NEEDLE/INTRACATH INITIAL W/PTA/IMG LEFT  11/10/2016   IR AV DIALY SHUNT INTRO NEEDLE/INTRACATH INITIAL W/PTA/IMG LEFT  03/09/2017   IR AV DIALY SHUNT INTRO NEEDLE/INTRACATH INITIAL W/PTA/IMG LEFT  12/12/2017   IR AV DIALY SHUNT INTRO NEEDLE/INTRACATH INITIAL W/PTA/IMG LEFT  02/27/2018   IR AV DIALY SHUNT INTRO NEEDLE/INTRACATH INITIAL W/PTA/IMG LEFT  06/05/2018   IR AV DIALY SHUNT INTRO NEEDLE/INTRACATH INITIAL W/PTA/IMG LEFT  09/11/2018   IR AV DIALY SHUNT INTRO NEEDLE/INTRACATH INITIAL W/PTA/IMG RIGHT Right 04/21/2020   IR AV DIALY SHUNT INTRO NEEDLE/INTRACATH INITIAL W/PTA/IMG RIGHT Right 10/08/2020   IR DIALY SHUNT INTRO NEEDLE/INTRACATH INITIAL W/IMG LEFT Left 05/08/2018   IR FLUORO GUIDE CV LINE RIGHT  02/01/2019   IR FLUORO GUIDE CV LINE RIGHT  07/14/2020   IR FLUORO GUIDE CV LINE RIGHT  12/29/2020   IR GENERIC HISTORICAL  08/01/2016   IR FLUORO GUIDE CV LINE RIGHT 08/01/2016 [name], MD [facility]   IR GENERIC HISTORICAL  08/01/2016   IR  US GUIDE VASC ACCESS RIGHT 08/01/2016 [name], MD [facility]   IR REMOVAL TUN CV CATH W/O FL  11/01/2016   IR REMOVAL TUN CV CATH W/O FL  01/02/2020   IR THROMBECTOMY AV FISTULA W/THROMBOLYSIS INC/SHUNT/IMG LEFT Left 01/31/2019   IR THROMBECTOMY AV FISTULA W/THROMBOLYSIS/PTA INC/SHUNT/IMG LEFT Left 01/27/2019   IR THROMBECTOMY AV FISTULA W/THROMBOLYSIS/PTA INC/SHUNT/IMG LEFT Left 07/16/2020   IR THROMBECTOMY AV FISTULA W/THROMBOLYSIS/PTA/STENT INC/SHUNT/IMG RT Right 09/24/2020   IR US GUIDE VASC ACCESS LEFT  11/10/2016    IR US GUIDE VASC ACCESS LEFT  09/12/2017   IR US GUIDE VASC ACCESS LEFT  12/12/2017   IR US GUIDE VASC ACCESS LEFT  06/05/2018   IR US GUIDE VASC ACCESS LEFT  09/11/2018   IR US GUIDE VASC ACCESS LEFT  01/27/2019   IR US GUIDE VASC ACCESS LEFT  02/01/2019   IR US GUIDE VASC ACCESS RIGHT  02/01/2019   IR US GUIDE VASC ACCESS RIGHT  07/14/2020   IR US GUIDE VASC ACCESS RIGHT  07/16/2020   IR US GUIDE VASC ACCESS RIGHT  10/08/2020   IR US GUIDE VASC ACCESS RIGHT  12/29/2020    Short Social History:  Social History   Tobacco Use   Smoking status: Never   Smokeless tobacco: Never  Substance Use Topics   Alcohol use: Not Currently    Alcohol/week: 0.0 standard drinks    Comment: rare- a beer every now and then    No Known Allergies  Current Outpatient Medications  Medication Sig Dispense Refill   Amino Acids-Protein Hydrolys (FEEDING SUPPLEMENT, PRO-STAT SUGAR FREE 64,) LIQD Take 30 mLs by mouth 2 (two) times daily. 900 mL 0   atorvastatin (LIPITOR) 40 MG tablet TAKE 1 TABLET (40 MG TOTAL) BY MOUTH DAILY. 30 tablet 6   B Complex-C-Zn-Folic Acid (DIALYVITE Q000111Q WITH ZINC) 0.8 MG TABS Take 1 tablet by mouth daily.     Blood Glucose Monitoring Suppl (TRUE METRIX METER) DEVI 1 each by Does not apply route 3 (three) times daily before meals. 1 Device 0   cetirizine (ZYRTEC) 10 MG tablet Take 1 tablet (10 mg total) by mouth daily. 30 tablet 1   cinacalcet (SENSIPAR) 60 MG tablet Take 60 mg by mouth daily.     diphenoxylate-atropine (LOMOTIL) 2.5-0.025 MG tablet Take 1 tablet by mouth 3 (three) times daily as needed for diarrhea or loose stools. 30 tablet 0   DULoxetine (CYMBALTA) 60 MG capsule TAKE 1 CAPSULE (60 MG TOTAL) BY MOUTH DAILY. 30 capsule 6   ethyl chloride spray SPRAY 1 SPRAY TO THE SKIN 3 TIMES A WEEK AS NEEDED. SPRAY A SMALL AMOUNT JUST PRIOR TO NEEDLE INSERTION. 116 mL 0   gabapentin (NEURONTIN) 300 MG capsule TAKE 1 CAPSULE (300 MG TOTAL) BY MOUTH AT BEDTIME. 30 capsule 2   glucose blood  (TRUE METRIX BLOOD GLUCOSE TEST) test strip Use 3 times daily before meals 100 each 3   insulin glargine (LANTUS) 100 UNIT/ML injection INJECT SUBCUTANEOUSLY TWICE DAILY 15 UNITS IN THE MORNING AND 10 IN THE EVENING 30 mL 6   Insulin Syringe-Needle U-100 (TRUEPLUS INSULIN SYRINGE) 31G X 5/16" 1 ML MISC USE AS DIRECTED 100 each 5   lisinopril (ZESTRIL) 20 MG tablet TAKE 1 TABLET (20 MG TOTAL) BY MOUTH DAILY. 30 tablet 6   methocarbamol (ROBAXIN) 500 MG tablet TAKE 1 TABLET (500 MG TOTAL) BY MOUTH 2 (TWO) TIMES DAILY AS NEEDED FOR MUSCLE SPASMS. 30 tablet 2   mupirocin ointment (BACTROBAN) 2 %  APPLY 1 APPLICATION TOPICALLY 2 (TWO) TIMES DAILY. 22 g 2   Nutritional Supplements (FEEDING SUPPLEMENT, NEPRO CARB STEADY,) LIQD Take 237 mLs by mouth Every Tuesday,Thursday,and Saturday with dialysis.      omeprazole (PRILOSEC) 20 MG capsule TAKE 1 CAPSULE (20 MG TOTAL) BY MOUTH DAILY. 30 capsule 3   sucroferric oxyhydroxide (VELPHORO) 500 MG chewable tablet Chew 500 mg by mouth 2 (two) times daily with a meal.      TRUEplus Lancets 28G MISC 1 each by Other route 3 (three) times daily. 100 each 11   No current facility-administered medications for this visit.   Facility-Administered Medications Ordered in Other Visits  Medication Dose Route Frequency Provider Last Rate Last Admin   0.9 %  sodium chloride infusion   Intravenous Continuous Monia Sabal, PA-C        Review of Systems  HENT: HENT negative.  Eyes: Eyes negative.  Cardiovascular: Cardiovascular negative.  GI: Positive for abdominal pain.  Musculoskeletal: Positive for leg pain.  Neurological: Neurological negative. Hematologic: Hematologic/lymphatic negative.  Psychiatric: Psychiatric negative.       Objective:  Objective  Vitals:   02/25/21 1219  BP: 128/71  Pulse: 64  Resp: 14  Temp: (!) 97.3 F (36.3 C)  SpO2: 96%    Physical Exam HENT:     Head: Normocephalic.     Nose:     Comments: Wearing mask Neck:      Comments: Right IJ catheter in place Cardiovascular:     Pulses:          Radial pulses are 1+ on the right side and 1+ on the left side.  Abdominal:     General: Abdomen is flat.     Palpations: Abdomen is soft. There is no mass.  Musculoskeletal:        General: Normal range of motion.     Cervical back: Normal range of motion.  Skin:    General: Skin is warm and dry.     Capillary Refill: Capillary refill takes less than 2 seconds.  Neurological:     General: No focal deficit present.     Mental Status: She is alert.    Data: +-----------------+-------------+----------+--------+  Right Basilic    Diameter (cm)Depth (cm)Findings  +-----------------+-------------+----------+--------+  Dist upper arm       0.25                         +-----------------+-------------+----------+--------+  Antecubital fossa    0.22                         +-----------------+-------------+----------+--------+   Summary:    Left: Patent but small basilic vein.   Right Pre-Dialysis Findings:  +----------------------+----------+------------------+---------+-----------  ----+  Location              PSV (cm/s)Intralum. Diam.   Waveform Comments                                          (cm)                                         +----------------------+----------+------------------+---------+-----------  ----+  Brachial Antecub.     74        0.37  biphasic                   fossa                                                                        +----------------------+----------+------------------+---------+-----------  ----+  Radial Art at Wrist   56        0.05                triphasicatherosclerotic  +----------------------+----------+------------------+---------+-----------  ----+  Ulnar Art at Wrist    58        0.17                triphasicatherosclerotic   +----------------------+----------+------------------+---------+-----------  ----+         Assessment/Plan:     70 year old female with history of bilateral upper extremity dialysis accesses that have now failed.  She is currently dialyzing via catheter.  I discussed the options being proceeding to right upper extremity AV graft versus bilateral extremity venography to evaluate for central venous disease.  We will proceed with venography on a nondialysis day in the near future and plan permanent access from there.  Patient and her son demonstrate good understanding and all questions were answered with interpreter today.     Waynetta Sandy MD [facility]

## 2021-02-25 NOTE — H&P (View-Only) (Signed)
Patient ID: Sarah Kelley, female   DOB: [birth year], 70 y.o.   MRN: [id number]  Reason for Consult: New Patient (Initial Visit) (New access placement/)   Referred by [name], MD  Subjective:     HPI:  Sarah Kelley is a 70 y.o. female history of end-stage renal disease currently dialyzing via catheter.  All information obtained via interpreter and her son is in the room with her.  She has recently failed right upper extremity fistula which is very thin-walled but was able to be cannulated up until approximately 2 months ago according to the son.  Patient is very quiet on exam she only complains of bilateral knee pain today.  She has failed bilateral upper extremity accesses.  She is right-hand dominant  Past Medical History:  Diagnosis Date   Diabetes mellitus    ESRD (end stage renal disease) ([place])    dialysis   Hyperlipidemia    Hypertension    Iron deficiency anemia    MRSA infection    hx of   Neuropathy    Diabetic   Pneumonia 07/2016   Renal disorder    Seizure-like activity ([place])    Sepsis ([place])    Thyroid disease    Wears dentures    No family history on file. Past Surgical History:  Procedure Laterality Date   AMPUTATION Left 05/21/2015   Procedure: Left Foot 3rd Ray Amputation;  Surgeon: Newt Minion, MD;  Location: [place];  Service: Orthopedics;  Laterality: Left;   AV FISTULA PLACEMENT Left 03/28/2016   Procedure: ARTERIOVENOUS (AV) FISTULA CREATION LEFT ARM;  Surgeon: Waynetta Sandy, MD;  Location: [place];  Service: Vascular;  Laterality: Left;   AV FISTULA PLACEMENT Right 06/04/2019   Procedure: ARTERIOVENOUS (AV) FISTULA CREATION RIGHT ARM;  Surgeon: Waynetta Sandy, MD;  Location: [place];  Service: Vascular;  Laterality: Right;   DILATION AND CURETTAGE OF UTERUS     EYE SURGERY     FISTULA SUPERFICIALIZATION Left 09/04/2016   Procedure: FISTULA SUPERFICIALIZATION LEFT UPPER ARM;  Surgeon: Waynetta Sandy,  MD;  Location: [place];  Service: Vascular;  Laterality: Left;   FISTULA SUPERFICIALIZATION Right 10/08/2019   Procedure: FISTULA SUPERFICIALIZATION OR RIGHT ARM;  Surgeon: Waynetta Sandy, MD;  Location: [place];  Service: Vascular;  Laterality: Right;   IR AV DIALY SHUNT INTRO NEEDLE/INTRAC INITIAL W/PTA/STENT/IMG LT Left 06/13/2017   IR AV DIALY SHUNT INTRO NEEDLE/INTRAC INITIAL W/PTA/STENT/IMG LT Left 09/12/2017   IR AV DIALY SHUNT INTRO NEEDLE/INTRACATH INITIAL W/PTA/IMG LEFT  11/10/2016   IR AV DIALY SHUNT INTRO NEEDLE/INTRACATH INITIAL W/PTA/IMG LEFT  03/09/2017   IR AV DIALY SHUNT INTRO NEEDLE/INTRACATH INITIAL W/PTA/IMG LEFT  12/12/2017   IR AV DIALY SHUNT INTRO NEEDLE/INTRACATH INITIAL W/PTA/IMG LEFT  02/27/2018   IR AV DIALY SHUNT INTRO NEEDLE/INTRACATH INITIAL W/PTA/IMG LEFT  06/05/2018   IR AV DIALY SHUNT INTRO NEEDLE/INTRACATH INITIAL W/PTA/IMG LEFT  09/11/2018   IR AV DIALY SHUNT INTRO NEEDLE/INTRACATH INITIAL W/PTA/IMG RIGHT Right 04/21/2020   IR AV DIALY SHUNT INTRO NEEDLE/INTRACATH INITIAL W/PTA/IMG RIGHT Right 10/08/2020   IR DIALY SHUNT INTRO NEEDLE/INTRACATH INITIAL W/IMG LEFT Left 05/08/2018   IR FLUORO GUIDE CV LINE RIGHT  02/01/2019   IR FLUORO GUIDE CV LINE RIGHT  07/14/2020   IR FLUORO GUIDE CV LINE RIGHT  12/29/2020   IR GENERIC HISTORICAL  08/01/2016   IR FLUORO GUIDE CV LINE RIGHT 08/01/2016 [name], MD [facility]   IR GENERIC HISTORICAL  08/01/2016   IR  US GUIDE VASC ACCESS RIGHT 08/01/2016 [name], MD [facility]   IR REMOVAL TUN CV CATH W/O FL  11/01/2016   IR REMOVAL TUN CV CATH W/O FL  01/02/2020   IR THROMBECTOMY AV FISTULA W/THROMBOLYSIS INC/SHUNT/IMG LEFT Left 01/31/2019   IR THROMBECTOMY AV FISTULA W/THROMBOLYSIS/PTA INC/SHUNT/IMG LEFT Left 01/27/2019   IR THROMBECTOMY AV FISTULA W/THROMBOLYSIS/PTA INC/SHUNT/IMG LEFT Left 07/16/2020   IR THROMBECTOMY AV FISTULA W/THROMBOLYSIS/PTA/STENT INC/SHUNT/IMG RT Right 09/24/2020   IR US GUIDE VASC ACCESS LEFT  11/10/2016    IR US GUIDE VASC ACCESS LEFT  09/12/2017   IR US GUIDE VASC ACCESS LEFT  12/12/2017   IR US GUIDE VASC ACCESS LEFT  06/05/2018   IR US GUIDE VASC ACCESS LEFT  09/11/2018   IR US GUIDE VASC ACCESS LEFT  01/27/2019   IR US GUIDE VASC ACCESS LEFT  02/01/2019   IR US GUIDE VASC ACCESS RIGHT  02/01/2019   IR US GUIDE VASC ACCESS RIGHT  07/14/2020   IR US GUIDE VASC ACCESS RIGHT  07/16/2020   IR US GUIDE VASC ACCESS RIGHT  10/08/2020   IR US GUIDE VASC ACCESS RIGHT  12/29/2020    Short Social History:  Social History   Tobacco Use   Smoking status: Never   Smokeless tobacco: Never  Substance Use Topics   Alcohol use: Not Currently    Alcohol/week: 0.0 standard drinks    Comment: rare- a beer every now and then    No Known Allergies  Current Outpatient Medications  Medication Sig Dispense Refill   Amino Acids-Protein Hydrolys (FEEDING SUPPLEMENT, PRO-STAT SUGAR FREE 64,) LIQD Take 30 mLs by mouth 2 (two) times daily. 900 mL 0   atorvastatin (LIPITOR) 40 MG tablet TAKE 1 TABLET (40 MG TOTAL) BY MOUTH DAILY. 30 tablet 6   B Complex-C-Zn-Folic Acid (DIALYVITE Q000111Q WITH ZINC) 0.8 MG TABS Take 1 tablet by mouth daily.     Blood Glucose Monitoring Suppl (TRUE METRIX METER) DEVI 1 each by Does not apply route 3 (three) times daily before meals. 1 Device 0   cetirizine (ZYRTEC) 10 MG tablet Take 1 tablet (10 mg total) by mouth daily. 30 tablet 1   cinacalcet (SENSIPAR) 60 MG tablet Take 60 mg by mouth daily.     diphenoxylate-atropine (LOMOTIL) 2.5-0.025 MG tablet Take 1 tablet by mouth 3 (three) times daily as needed for diarrhea or loose stools. 30 tablet 0   DULoxetine (CYMBALTA) 60 MG capsule TAKE 1 CAPSULE (60 MG TOTAL) BY MOUTH DAILY. 30 capsule 6   ethyl chloride spray SPRAY 1 SPRAY TO THE SKIN 3 TIMES A WEEK AS NEEDED. SPRAY A SMALL AMOUNT JUST PRIOR TO NEEDLE INSERTION. 116 mL 0   gabapentin (NEURONTIN) 300 MG capsule TAKE 1 CAPSULE (300 MG TOTAL) BY MOUTH AT BEDTIME. 30 capsule 2   glucose blood  (TRUE METRIX BLOOD GLUCOSE TEST) test strip Use 3 times daily before meals 100 each 3   insulin glargine (LANTUS) 100 UNIT/ML injection INJECT SUBCUTANEOUSLY TWICE DAILY 15 UNITS IN THE MORNING AND 10 IN THE EVENING 30 mL 6   Insulin Syringe-Needle U-100 (TRUEPLUS INSULIN SYRINGE) 31G X 5/16" 1 ML MISC USE AS DIRECTED 100 each 5   lisinopril (ZESTRIL) 20 MG tablet TAKE 1 TABLET (20 MG TOTAL) BY MOUTH DAILY. 30 tablet 6   methocarbamol (ROBAXIN) 500 MG tablet TAKE 1 TABLET (500 MG TOTAL) BY MOUTH 2 (TWO) TIMES DAILY AS NEEDED FOR MUSCLE SPASMS. 30 tablet 2   mupirocin ointment (BACTROBAN) 2 %  APPLY 1 APPLICATION TOPICALLY 2 (TWO) TIMES DAILY. 22 g 2   Nutritional Supplements (FEEDING SUPPLEMENT, NEPRO CARB STEADY,) LIQD Take 237 mLs by mouth Every Tuesday,Thursday,and Saturday with dialysis.      omeprazole (PRILOSEC) 20 MG capsule TAKE 1 CAPSULE (20 MG TOTAL) BY MOUTH DAILY. 30 capsule 3   sucroferric oxyhydroxide (VELPHORO) 500 MG chewable tablet Chew 500 mg by mouth 2 (two) times daily with a meal.      TRUEplus Lancets 28G MISC 1 each by Other route 3 (three) times daily. 100 each 11   No current facility-administered medications for this visit.   Facility-Administered Medications Ordered in Other Visits  Medication Dose Route Frequency Provider Last Rate Last Admin   0.9 %  sodium chloride infusion   Intravenous Continuous Monia Sabal, PA-C        Review of Systems  HENT: HENT negative.  Eyes: Eyes negative.  Cardiovascular: Cardiovascular negative.  GI: Positive for abdominal pain.  Musculoskeletal: Positive for leg pain.  Neurological: Neurological negative. Hematologic: Hematologic/lymphatic negative.  Psychiatric: Psychiatric negative.       Objective:  Objective  Vitals:   02/25/21 1219  BP: 128/71  Pulse: 64  Resp: 14  Temp: (!) 97.3 F (36.3 C)  SpO2: 96%    Physical Exam HENT:     Head: Normocephalic.     Nose:     Comments: Wearing mask Neck:      Comments: Right IJ catheter in place Cardiovascular:     Pulses:          Radial pulses are 1+ on the right side and 1+ on the left side.  Abdominal:     General: Abdomen is flat.     Palpations: Abdomen is soft. There is no mass.  Musculoskeletal:        General: Normal range of motion.     Cervical back: Normal range of motion.  Skin:    General: Skin is warm and dry.     Capillary Refill: Capillary refill takes less than 2 seconds.  Neurological:     General: No focal deficit present.     Mental Status: She is alert.    Data: +-----------------+-------------+----------+--------+  Right Basilic    Diameter (cm)Depth (cm)Findings  +-----------------+-------------+----------+--------+  Dist upper arm       0.25                         +-----------------+-------------+----------+--------+  Antecubital fossa    0.22                         +-----------------+-------------+----------+--------+   Summary:    Left: Patent but small basilic vein.   Right Pre-Dialysis Findings:  +----------------------+----------+------------------+---------+-----------  ----+  Location              PSV (cm/s)Intralum. Diam.   Waveform Comments                                          (cm)                                         +----------------------+----------+------------------+---------+-----------  ----+  Brachial Antecub.     74        0.37  biphasic                   fossa                                                                        +----------------------+----------+------------------+---------+-----------  ----+  Radial Art at Wrist   56        0.05                triphasicatherosclerotic  +----------------------+----------+------------------+---------+-----------  ----+  Ulnar Art at Wrist    58        0.17                triphasicatherosclerotic   +----------------------+----------+------------------+---------+-----------  ----+         Assessment/Plan:     70 year old female with history of bilateral upper extremity dialysis accesses that have now failed.  She is currently dialyzing via catheter.  I discussed the options being proceeding to right upper extremity AV graft versus bilateral extremity venography to evaluate for central venous disease.  We will proceed with venography on a nondialysis day in the near future and plan permanent access from there.  Patient and her son demonstrate good understanding and all questions were answered with interpreter today.     Waynetta Sandy MD [facility]

## 2021-02-28 ENCOUNTER — Ambulatory Visit (HOSPITAL_COMMUNITY)
Admission: RE | Admit: 2021-02-28 | Discharge: 2021-02-28 | Disposition: A | Discharge status: Home / Self Care | Payer: Self-pay | Source: Ambulatory Visit | Attending: Vascular Surgery | Admitting: Vascular Surgery

## 2021-02-28 ENCOUNTER — Encounter (HOSPITAL_COMMUNITY)
Admission: RE | Disposition: A | Discharge status: Home / Self Care | Payer: Self-pay | Source: Ambulatory Visit | Attending: Vascular Surgery

## 2021-02-28 ENCOUNTER — Encounter (HOSPITAL_COMMUNITY): Payer: Self-pay | Admitting: Vascular Surgery

## 2021-02-28 DIAGNOSIS — E1122 Type 2 diabetes mellitus with diabetic chronic kidney disease: Secondary | ICD-10-CM | POA: Insufficient documentation

## 2021-02-28 DIAGNOSIS — Z992 Dependence on renal dialysis: Secondary | ICD-10-CM

## 2021-02-28 DIAGNOSIS — N186 End stage renal disease: Secondary | ICD-10-CM | POA: Insufficient documentation

## 2021-02-28 DIAGNOSIS — Z794 Long term (current) use of insulin: Secondary | ICD-10-CM | POA: Insufficient documentation

## 2021-02-28 DIAGNOSIS — I120 Hypertensive chronic kidney disease with stage 5 chronic kidney disease or end stage renal disease: Secondary | ICD-10-CM | POA: Insufficient documentation

## 2021-02-28 DIAGNOSIS — Z79899 Other long term (current) drug therapy: Secondary | ICD-10-CM | POA: Insufficient documentation

## 2021-02-28 HISTORY — PX: UPPER EXTREMITY VENOGRAPHY: CATH118272

## 2021-02-28 LAB — POCT I-STAT, CHEM 8
BUN: 50 mg/dL — ABNORMAL HIGH (ref 8–23)
Calcium, Ion: 1.1 mmol/L — ABNORMAL LOW (ref 1.15–1.40)
Chloride: 99 mmol/L (ref 98–111)
Creatinine, Ser: 7.2 mg/dL — ABNORMAL HIGH (ref 0.44–1.00)
Glucose, Bld: 72 mg/dL (ref 70–99)
HCT: 39 % (ref 36.0–46.0)
Hemoglobin: 13.3 g/dL (ref 12.0–15.0)
Potassium: 4.9 mmol/L (ref 3.5–5.1)
Sodium: 138 mmol/L (ref 135–145)
TCO2: 31 mmol/L (ref 22–32)

## 2021-02-28 SURGERY — UPPER EXTREMITY VENOGRAPHY
Anesthesia: LOCAL | Laterality: Bilateral

## 2021-02-28 MED ORDER — SODIUM CHLORIDE 0.9 % IV SOLN: 250.0000 mL | INTRAVENOUS | Status: DC | PRN

## 2021-02-28 MED ORDER — SODIUM CHLORIDE 0.9% FLUSH: 3.0000 mL | Freq: Two times a day (BID) | INTRAVENOUS | Status: DC

## 2021-02-28 MED ORDER — SODIUM CHLORIDE 0.9% FLUSH: 3.0000 mL | INTRAVENOUS | Status: DC | PRN

## 2021-02-28 MED ORDER — IODIXANOL 320 MG/ML IV SOLN
INTRAVENOUS | Status: DC | PRN
  Administered 2021-02-28: 15 mL

## 2021-02-28 SURGICAL SUPPLY — 1 items: STOPCOCK MORSE 400PSI 3WAY (MISCELLANEOUS) ×2 IMPLANT

## 2021-02-28 NOTE — Progress Notes (Signed)
Patient was given discharge instructions. She verbalized understanding. 

## 2021-02-28 NOTE — Op Note (Signed)
    Patient name: Sarah Kelley MRN: [id number] DOB: April 26, 1951 Sex: female  02/28/2021 Pre-operative Diagnosis: End-stage renal disease Post-operative diagnosis:  Same Surgeon:  Erlene Quan C. Donzetta Matters, MD Procedure Performed:  Bilateral upper extremity venography  Indications: 70 year old female with history of bilateral upper extremity arteriovenous AV fistula placements.  Both of these have now failed.  She is indicated for venography to plan further permanent access.  Findings: The left side the central veins appeared patent jumping into the SVC without flow limitation.  There is an IJ catheter at the SVC atrial junction present from the right side.  On the right side the veins are patent centrally however contrast flow is more sluggish.  Patient has palpable brachial pulses bilaterally and at least a palpable ulnar pulse on the left.    Given that she is right-hand dominant we will plan for left arm AV graft on a nondialysis day in the near future.  Patient dialyzes Tuesdays, Thursdays and Saturdays.   Procedure:  The patient was identified in the holding area and taken to room 8.  The patient was then placed supine on the table timeout was called.  The bilateral veins in her upper extremities were accessed and upper extremity venography was performed with the above findings.  We will plan for left arm AV graft.   Contrast: 15cc    Orli Degrave C. Donzetta Matters, MD [facility] [place] Office: [phone] Pager: [phone]

## 2021-02-28 NOTE — Interval H&P Note (Signed)
History and Physical Interval Note:  02/28/2021 10:14 AM  Sarah Kelley  has presented today for surgery, with the diagnosis of end stage renal.  The various methods of treatment have been discussed with the patient and family. After consideration of risks, benefits and other options for treatment, the patient has consented to  Procedure(s): UPPER EXTREMITY VENOGRAPHY (N/A) as a surgical intervention.  The patient's history has been reviewed, patient examined, no change in status, stable for surgery.  I have reviewed the patient's chart and labs.  Questions were answered to the patient's satisfaction.     Servando Snare

## 2021-03-01 ENCOUNTER — Other Ambulatory Visit: Payer: Self-pay

## 2021-03-10 ENCOUNTER — Other Ambulatory Visit: Payer: Self-pay

## 2021-03-10 ENCOUNTER — Encounter (HOSPITAL_COMMUNITY): Payer: Self-pay | Admitting: Vascular Surgery

## 2021-03-10 ENCOUNTER — Other Ambulatory Visit: Payer: Self-pay | Admitting: Physician Assistant

## 2021-03-10 DIAGNOSIS — E119 Type 2 diabetes mellitus without complications: Secondary | ICD-10-CM

## 2021-03-10 DIAGNOSIS — E1149 Type 2 diabetes mellitus with other diabetic neurological complication: Secondary | ICD-10-CM

## 2021-03-10 DIAGNOSIS — Z794 Long term (current) use of insulin: Secondary | ICD-10-CM

## 2021-03-10 MED FILL — Duloxetine HCl Enteric Coated Pellets Cap 60 MG (Base Eq): ORAL | 30 days supply | Qty: 30 | Fill #4 | Status: CN

## 2021-03-10 NOTE — Progress Notes (Signed)
Spoke with pt's son, [name] for pre-op call. DPR on file. Spoke to him via [employer], [place] [phone] ([place] [address]). He states pt does not have a cardiac history. Pt is treated for HTN, kidney diease and Type 2 Diabetes. Pt's most recent A1C was 8.3 on 02/14/21. [name] states pt sleeps late and he does not check her blood sugar until she has eaten.   Instructed him to have pt take 1/2 of her regular dose of Lantus this evening and again in the AM. Instructed him to check her blood sugar when she wakes up in the AM and every 2 hours until she leaves for the hospital. If blood sugar is 70 or below, treat with 1/2 cup of clear juice (apple or cranberry) and recheck blood sugar 15 minutes after drinking juice. If blood sugar continues to be 70 or below, call the Short Stay department and ask to speak to a nurse. Marcial voiced understanding.

## 2021-03-11 ENCOUNTER — Other Ambulatory Visit: Payer: Self-pay

## 2021-03-11 ENCOUNTER — Ambulatory Visit (HOSPITAL_COMMUNITY): Payer: Self-pay | Admitting: Anesthesiology

## 2021-03-11 ENCOUNTER — Encounter (HOSPITAL_COMMUNITY)
Admission: RE | Disposition: A | Discharge status: Home / Self Care | Payer: Self-pay | Source: Ambulatory Visit | Attending: Vascular Surgery

## 2021-03-11 ENCOUNTER — Ambulatory Visit (HOSPITAL_COMMUNITY)
Admission: RE | Admit: 2021-03-11 | Discharge: 2021-03-11 | Disposition: A | Discharge status: Home / Self Care | Payer: Self-pay | Source: Ambulatory Visit | Attending: Vascular Surgery | Admitting: Vascular Surgery

## 2021-03-11 ENCOUNTER — Encounter (HOSPITAL_COMMUNITY): Payer: Self-pay | Admitting: Vascular Surgery

## 2021-03-11 DIAGNOSIS — Z992 Dependence on renal dialysis: Secondary | ICD-10-CM

## 2021-03-11 DIAGNOSIS — Z79899 Other long term (current) drug therapy: Secondary | ICD-10-CM | POA: Insufficient documentation

## 2021-03-11 DIAGNOSIS — Z794 Long term (current) use of insulin: Secondary | ICD-10-CM | POA: Insufficient documentation

## 2021-03-11 DIAGNOSIS — I120 Hypertensive chronic kidney disease with stage 5 chronic kidney disease or end stage renal disease: Secondary | ICD-10-CM | POA: Insufficient documentation

## 2021-03-11 DIAGNOSIS — N186 End stage renal disease: Secondary | ICD-10-CM

## 2021-03-11 DIAGNOSIS — Z89422 Acquired absence of other left toe(s): Secondary | ICD-10-CM | POA: Insufficient documentation

## 2021-03-11 DIAGNOSIS — E1122 Type 2 diabetes mellitus with diabetic chronic kidney disease: Secondary | ICD-10-CM | POA: Insufficient documentation

## 2021-03-11 DIAGNOSIS — E1140 Type 2 diabetes mellitus with diabetic neuropathy, unspecified: Secondary | ICD-10-CM | POA: Insufficient documentation

## 2021-03-11 HISTORY — DX: Gastro-esophageal reflux disease without esophagitis: K21.9

## 2021-03-11 HISTORY — PX: AV FISTULA PLACEMENT: SHX1204

## 2021-03-11 LAB — POCT I-STAT, CHEM 8
BUN: 47 mg/dL — ABNORMAL HIGH (ref 8–23)
Calcium, Ion: 1.04 mmol/L — ABNORMAL LOW (ref 1.15–1.40)
Chloride: 103 mmol/L (ref 98–111)
Creatinine, Ser: 6.1 mg/dL — ABNORMAL HIGH (ref 0.44–1.00)
Glucose, Bld: 151 mg/dL — ABNORMAL HIGH (ref 70–99)
HCT: 36 % (ref 36.0–46.0)
Hemoglobin: 12.2 g/dL (ref 12.0–15.0)
Potassium: 5 mmol/L (ref 3.5–5.1)
Sodium: 136 mmol/L (ref 135–145)
TCO2: 30 mmol/L (ref 22–32)

## 2021-03-11 LAB — GLUCOSE, CAPILLARY
Glucose-Capillary: 141 mg/dL — ABNORMAL HIGH (ref 70–99)
Glucose-Capillary: 138 mg/dL — ABNORMAL HIGH (ref 70–99)
Glucose-Capillary: 159 mg/dL — ABNORMAL HIGH (ref 70–99)

## 2021-03-11 SURGERY — INSERTION OF ARTERIOVENOUS (AV) GORE-TEX GRAFT ARM
Anesthesia: Monitor Anesthesia Care | Laterality: Left

## 2021-03-11 MED ORDER — PHENYLEPHRINE 40 MCG/ML (10ML) SYRINGE FOR IV PUSH (FOR BLOOD PRESSURE SUPPORT)
PREFILLED_SYRINGE | INTRAVENOUS | Status: AC
  Filled 2021-03-11: qty 10

## 2021-03-11 MED ORDER — ONDANSETRON HCL 4 MG/2ML IJ SOLN
INTRAMUSCULAR | Status: DC | PRN
  Administered 2021-03-11: 4 mg via INTRAVENOUS

## 2021-03-11 MED ORDER — PROPOFOL 500 MG/50ML IV EMUL
INTRAVENOUS | Status: DC | PRN
  Administered 2021-03-11: 25 ug/kg/min via INTRAVENOUS

## 2021-03-11 MED ORDER — ONDANSETRON HCL 4 MG/2ML IJ SOLN
INTRAMUSCULAR | Status: AC
  Filled 2021-03-11: qty 4

## 2021-03-11 MED ORDER — FENTANYL CITRATE (PF) 250 MCG/5ML IJ SOLN
INTRAMUSCULAR | Status: AC
  Filled 2021-03-11: qty 5

## 2021-03-11 MED ORDER — CEFAZOLIN SODIUM-DEXTROSE 2-4 GM/100ML-% IV SOLN
2.0000 g | INTRAVENOUS | Status: AC
  Administered 2021-03-11: 2 g via INTRAVENOUS
  Filled 2021-03-11: qty 100

## 2021-03-11 MED ORDER — FENTANYL CITRATE (PF) 100 MCG/2ML IJ SOLN
INTRAMUSCULAR | Status: AC
  Administered 2021-03-11: 50 ug via INTRAVENOUS
  Filled 2021-03-11: qty 2

## 2021-03-11 MED ORDER — FENTANYL CITRATE (PF) 100 MCG/2ML IJ SOLN
INTRAMUSCULAR | Status: DC | PRN
  Administered 2021-03-11: 25 ug via INTRAVENOUS

## 2021-03-11 MED ORDER — 0.9 % SODIUM CHLORIDE (POUR BTL) OPTIME
TOPICAL | Status: DC | PRN
  Administered 2021-03-11: 1000 mL

## 2021-03-11 MED ORDER — DEXAMETHASONE SODIUM PHOSPHATE 10 MG/ML IJ SOLN: INTRAMUSCULAR | Status: DC | PRN

## 2021-03-11 MED ORDER — SODIUM CHLORIDE 0.9 % IV SOLN: INTRAVENOUS | Status: DC

## 2021-03-11 MED ORDER — MIDAZOLAM HCL 2 MG/2ML IJ SOLN
INTRAMUSCULAR | Status: AC
  Filled 2021-03-11: qty 2

## 2021-03-11 MED ORDER — OXYCODONE HCL 5 MG PO TABS
5.0000 mg | ORAL_TABLET | Freq: Once | ORAL | Status: DC | PRN
Start: 2021-03-11 — End: 2021-03-11

## 2021-03-11 MED ORDER — CHLORHEXIDINE GLUCONATE 4 % EX LIQD: 60.0000 mL | Freq: Once | CUTANEOUS | Status: DC

## 2021-03-11 MED ORDER — ACETAMINOPHEN 500 MG PO TABS
1000.0000 mg | ORAL_TABLET | Freq: Once | ORAL | Status: AC
  Administered 2021-03-11: 1000 mg via ORAL
  Filled 2021-03-11: qty 2

## 2021-03-11 MED ORDER — OXYCODONE HCL 5 MG/5ML PO SOLN: 5.0000 mg | Freq: Once | ORAL | Status: DC | PRN

## 2021-03-11 MED ORDER — DEXAMETHASONE SODIUM PHOSPHATE 10 MG/ML IJ SOLN
INTRAMUSCULAR | Status: AC
  Filled 2021-03-11: qty 2

## 2021-03-11 MED ORDER — HEPARIN 6000 UNIT IRRIGATION SOLUTION
Status: DC | PRN
  Administered 2021-03-11: 1

## 2021-03-11 MED ORDER — PROPOFOL 10 MG/ML IV BOLUS
INTRAVENOUS | Status: AC
  Filled 2021-03-11: qty 20

## 2021-03-11 MED ORDER — FENTANYL CITRATE (PF) 100 MCG/2ML IJ SOLN: 50.0000 ug | Freq: Once | INTRAMUSCULAR | Status: AC

## 2021-03-11 MED ORDER — HEPARIN 6000 UNIT IRRIGATION SOLUTION
Status: AC
  Filled 2021-03-11: qty 500

## 2021-03-11 MED ORDER — HYDROMORPHONE HCL 1 MG/ML IJ SOLN: 0.2500 mg | INTRAMUSCULAR | Status: DC | PRN

## 2021-03-11 MED ORDER — LIDOCAINE-EPINEPHRINE (PF) 1.5 %-1:200000 IJ SOLN
INTRAMUSCULAR | Status: DC | PRN
  Administered 2021-03-11: 25 mL via PERINEURAL

## 2021-03-11 MED ORDER — ONDANSETRON HCL 4 MG/2ML IJ SOLN: 4.0000 mg | Freq: Once | INTRAMUSCULAR | Status: DC | PRN

## 2021-03-11 MED ORDER — PHENYLEPHRINE HCL-NACL 20-0.9 MG/250ML-% IV SOLN
INTRAVENOUS | Status: DC | PRN
  Administered 2021-03-11: 15 ug/min via INTRAVENOUS

## 2021-03-11 MED ORDER — CHLORHEXIDINE GLUCONATE 0.12 % MT SOLN
15.0000 mL | Freq: Once | OROMUCOSAL | Status: AC
  Administered 2021-03-11: 15 mL via OROMUCOSAL
  Filled 2021-03-11: qty 15

## 2021-03-11 MED ORDER — OXYCODONE-ACETAMINOPHEN 5-325 MG PO TABS: 1.0000 | ORAL_TABLET | Freq: Four times a day (QID) | ORAL | 0 refills | Status: DC | PRN

## 2021-03-11 MED ORDER — GABAPENTIN 300 MG PO CAPS
ORAL_CAPSULE | Freq: Every day | ORAL | 2 refills | Status: DC
  Filled 2021-03-11: qty 30, 30d supply, fill #0
  Filled 2021-04-19: qty 30, 30d supply, fill #1
  Filled 2021-05-13: qty 30, 30d supply, fill #2

## 2021-03-11 MED ORDER — ORAL CARE MOUTH RINSE: 15.0000 mL | Freq: Once | OROMUCOSAL | Status: AC

## 2021-03-11 MED ORDER — "INSULIN SYRINGE-NEEDLE U-100 31G X 5/16"" 1 ML MISC"
5 refills | Status: DC
  Filled 2021-03-11: qty 100, 50d supply, fill #0
  Filled 2021-05-13: qty 100, 50d supply, fill #1
  Filled 2021-07-04: qty 100, 50d supply, fill #2
  Filled 2021-08-25: qty 100, 50d supply, fill #0
  Filled 2021-11-16: qty 100, 50d supply, fill #1
  Filled 2022-02-09: qty 100, 50d supply, fill #2

## 2021-03-11 SURGICAL SUPPLY — 35 items
ADH SKN CLS APL DERMABOND .7 (GAUZE/BANDAGES/DRESSINGS) ×1
ARMBAND PINK RESTRICT EXTREMIT (MISCELLANEOUS) ×3 IMPLANT
BAG COUNTER SPONGE SURGICOUNT (BAG) ×1 IMPLANT
BAG SPNG CNTER NS LX DISP (BAG)
CANISTER SUCT 3000ML PPV (MISCELLANEOUS) ×2 IMPLANT
CLIP LIGATING EXTRA MED SLVR (CLIP) ×2 IMPLANT
CLIP LIGATING EXTRA SM BLUE (MISCELLANEOUS) ×2 IMPLANT
CLIP VESOCCLUDE MED 6/CT (CLIP) ×1 IMPLANT
CLIP VESOCCLUDE SM WIDE 6/CT (CLIP) ×1 IMPLANT
DERMABOND ADVANCED (GAUZE/BANDAGES/DRESSINGS) ×1
DERMABOND ADVANCED .7 DNX12 (GAUZE/BANDAGES/DRESSINGS) ×1 IMPLANT
ELECT REM PT RETURN 9FT ADLT (ELECTROSURGICAL) ×2
ELECTRODE REM PT RTRN 9FT ADLT (ELECTROSURGICAL) ×1 IMPLANT
GLOVE SURG ENC MOIS LTX SZ7.5 (GLOVE) ×2 IMPLANT
GOWN STRL REUS W/ TWL LRG LVL3 (GOWN DISPOSABLE) ×2 IMPLANT
GOWN STRL REUS W/ TWL XL LVL3 (GOWN DISPOSABLE) ×1 IMPLANT
GOWN STRL REUS W/TWL LRG LVL3 (GOWN DISPOSABLE) ×4
GOWN STRL REUS W/TWL XL LVL3 (GOWN DISPOSABLE) ×2
GRAFT GORETEX STRT 4-7X45 (Vascular Products) ×1 IMPLANT
HEMOSTAT SNOW SURGICEL 2X4 (HEMOSTASIS) IMPLANT
INSERT FOGARTY SM (MISCELLANEOUS) ×2 IMPLANT
KIT BASIN OR (CUSTOM PROCEDURE TRAY) ×2 IMPLANT
KIT TURNOVER KIT B (KITS) ×2 IMPLANT
NS IRRIG 1000ML POUR BTL (IV SOLUTION) ×2 IMPLANT
PACK CV ACCESS (CUSTOM PROCEDURE TRAY) ×2 IMPLANT
PAD ARMBOARD 7.5X6 YLW CONV (MISCELLANEOUS) ×4 IMPLANT
SUT MNCRL AB 4-0 PS2 18 (SUTURE) ×2 IMPLANT
SUT PROLENE 6 0 BV (SUTURE) ×1 IMPLANT
SUT SILK 2 0 SH (SUTURE) IMPLANT
SUT VIC AB 3-0 SH 27 (SUTURE) ×4
SUT VIC AB 3-0 SH 27X BRD (SUTURE) ×2 IMPLANT
SYR TOOMEY 50ML (SYRINGE) IMPLANT
TOWEL GREEN STERILE (TOWEL DISPOSABLE) ×2 IMPLANT
UNDERPAD 30X36 HEAVY ABSORB (UNDERPADS AND DIAPERS) ×2 IMPLANT
WATER STERILE IRR 1000ML POUR (IV SOLUTION) ×2 IMPLANT

## 2021-03-11 NOTE — Anesthesia Procedure Notes (Signed)
Anesthesia Regional Block: Supraclavicular block   Pre-Anesthetic Checklist: , timeout performed,  Correct Patient, Correct Site, Correct Laterality,  Correct Procedure, Correct Position, site marked,  Risks and benefits discussed,  Surgical consent,  Pre-op evaluation,  At surgeon's request and post-op pain management  Laterality: Left  Prep: Maximum Sterile Barrier Precautions used, chloraprep       Needles:  Injection technique: Single-shot  Needle Type: Echogenic Stimulator Needle     Needle Length: 9cm  Needle Gauge: 22     Additional Needles:   Procedures:,,,, ultrasound used (permanent image in chart),,    Narrative:  Start time: 03/11/2021 11:50 AM End time: 03/11/2021 12:00 PM Injection made incrementally with aspirations every 5 mL.  Performed by: Personally  Anesthesiologist: Pervis Hocking, DO  Additional Notes: Monitors applied. No increased pain on injection. No increased resistance to injection. Injection made in 5cc increments. Good needle visualization. Patient tolerated procedure well.

## 2021-03-11 NOTE — Progress Notes (Signed)
Orthopedic Tech Progress Note Patient Details:  Sarah Kelley March 16, 1951 [id number] Sling was given to patient's nurse for application  Ortho Devices Type of Ortho Device: Arm sling Ortho Device/Splint Interventions: Ordered      Linus Salmons [name] 03/11/2021, 2:20 PM

## 2021-03-11 NOTE — Op Note (Signed)
    Patient name: Sarah Kelley MRN: [id number] DOB: 02-Nov-1950 Sex: female  03/11/2021 Pre-operative Diagnosis: esrd Post-operative diagnosis:  Same Surgeon:  Erlene Quan C. Donzetta Matters, MD Assistant: Leontine Locket, PA Procedure Performed: Left brachial artery to axillary vein AV graft creation with 4-7 mm stretch PTFE  Indications: 70 year old female with end-stage renal disease currently on dialysis via catheter.  She has failed bilateral upper extremity access.  We have performed venography of plan for left upper extremity AV graft  Findings: There were 2 axillary veins there was 1 large enough for end and configuration AV graft.  The brachial artery above the antecubitum was pulsatile did have some disease there.  Completion was a very strong thrill in the graft and there was a good signal in the artery distal to the graft.   Procedure:  The patient was identified in the holding area and taken to the operating room where she was put supine on upper table and MAC anesthesia was induced.  She was sterilely prepped draped in left upper extremity usual fashion, antibiotics were ministered a timeout was called.  Preoperative block of been placed and this was tested noted to be intact.  We use ultrasound identified a suitable axillary vein.  Longitudinal incision was made we dissected down to the deep fascia identified 2 axillary veins.  The largest of which was marked for orientation.  We made an incision above the antecubitum dissected down identify the brachial artery placed a vessel loop around this.  A 4 to 7 mm graft was then tunneled between the 2 incisions.  In the axilla was trimmed to size.  The vein was ligated distally in the arm spatulated flushed heparinized saline and clamped.  The graft was sewn end-to-end with 6-0 Prolene suture.  Upon completion we flushed through the graft reclamped the vein.  We turned our attention of the brachial artery.  This was clamped distally proximally opened  longitudinally flushed with heparinized saline distally only.  The graft was from size on the 4 mm and sewn end-to-side with 6-0 Prolene suture.  Prior completion without flushing directions.  Upon completion there was a very strong thrill in the graft with good flow by Doppler in the axillary vein.  There was a good signal in the brachial artery distal to the graft I did augment with compression of the graft.  The hand appeared well perfused.  We irrigated the wounds obtaining the stasis and closed in layers of Vicryl and Monocryl.  Dermabond was placed to the level of skin.  She was awakened from anesthesia having tolerated procedure well without immediate complication.  All counts were correct at completion.  EBL 25 cc  Breean Nannini C. Donzetta Matters, MD [facility] Office: [phone] Pager: [phone]

## 2021-03-11 NOTE — Transfer of Care (Signed)
Immediate Anesthesia Transfer of Care Note  Patient: Sarah Kelley  Procedure(s) Performed: INSERTION OF LEFT ARM ARTERIOVENOUS (AV) GORE-TEX GRAFT (Left)  Patient Location: PACU  Anesthesia Type:MAC and Regional  Level of Consciousness: sedated  Airway & Oxygen Therapy: Patient Spontanous Breathing  Post-op Assessment: Report given to RN and Post -op Vital signs reviewed and stable  Post vital signs: Reviewed and stable  Last Vitals:  Vitals Value Taken Time  BP 103/39 03/11/21 1345  Temp    Pulse 65 03/11/21 1346  Resp 14 03/11/21 1346  SpO2 100 % 03/11/21 1346  Vitals shown include unvalidated device data.  Last Pain:  Vitals:   03/11/21 0757  TempSrc: Oral  PainSc:          Complications: No notable events documented.

## 2021-03-11 NOTE — Discharge Instructions (Signed)
   [facility]  Discharge Instructions  AV Fistula or Graft Surgery for Dialysis Access  Please refer to the following instructions for your post-procedure care. Your surgeon or physician assistant will discuss any changes with you.  Activity  You may drive the day following your surgery, if you are comfortable and no longer taking prescription pain medication. Resume full activity as the soreness in your incision resolves.  Bathing/Showering  You may shower after you go home. Keep your incision dry for 48 hours. Do not soak in a bathtub, hot tub, or swim until the incision heals completely. You may not shower if you have a hemodialysis catheter.  Incision Care  Clean your incision with mild soap and water after 48 hours. Pat the area dry with a clean towel. You do not need a bandage unless otherwise instructed. Do not apply any ointments or creams to your incision. You may have skin glue on your incision. Do not peel it off. It will come off on its own in about one week. Your arm may swell a bit after surgery. To reduce swelling use pillows to elevate your arm so it is above your heart. Your doctor will tell you if you need to lightly wrap your arm with an ACE bandage.  Diet  Resume your normal diet. There are not special food restrictions following this procedure. In order to heal from your surgery, it is CRITICAL to get adequate nutrition. Your body requires vitamins, minerals, and protein. Vegetables are the best source of vitamins and minerals. Vegetables also provide the perfect balance of protein. Processed food has little nutritional value, so try to avoid this.  Medications  Resume taking all of your medications. If your incision is causing pain, you may take over-the counter pain relievers such as acetaminophen (Tylenol). If you were prescribed a stronger pain medication, please be aware these medications can cause nausea and constipation. Prevent  nausea by taking the medication with a snack or meal. Avoid constipation by drinking plenty of fluids and eating foods with high amount of fiber, such as fruits, vegetables, and grains.  Do not take Tylenol if you are taking prescription pain medications.  Follow up Your surgeon may want to see you in the office following your access surgery. If so, this will be arranged at the time of your surgery.  Please call us immediately for any of the following conditions:  Increased pain, redness, drainage (pus) from your incision site Fever of 101 degrees or higher Severe or worsening pain at your incision site Hand pain or numbness.  Reduce your risk of vascular disease:  Stop smoking. If you would like help, call [facility] at 1-800-QUIT-NOW 725 728 7834) or [place] at [place] your cholesterol Maintain a desired weight Control your diabetes Keep your blood pressure down  Dialysis  It will take several weeks to several months for your new dialysis access to be ready for use. Your surgeon will determine when it is okay to use it. Your nephrologist will continue to direct your dialysis. You can continue to use your Permcath until your new access is ready for use.   03/11/2021 Sarah Kelley [id number] 06/25/1951  Surgeon(s): Waynetta Sandy, MD  Procedure(s): INSERTION OF LEFT ARM ARTERIOVENOUS (AV) GORE-TEX GRAFT  x Do not stick graft for 4 weeks    If you have any questions, please call the office at [phone].

## 2021-03-11 NOTE — H&P (Signed)
HPI:   Sarah Kelley is a 70 y.o. female history of end-stage renal disease currently dialyzing via catheter.  All information obtained via interpreter and her son is in the room with her.  She has recently failed right upper extremity fistula which is very thin-walled but was able to be cannulated up until approximately 2 months ago according to the son.  Patient is very quiet on exam she only complains of bilateral knee pain today.  She has failed bilateral upper extremity accesses.  She is right-hand dominant       Past Medical History:  Diagnosis Date   Diabetes mellitus     ESRD (end stage renal disease) ([place])      dialysis   Hyperlipidemia     Hypertension     Iron deficiency anemia     MRSA infection      hx of   Neuropathy      Diabetic   Pneumonia 07/2016   Renal disorder     Seizure-like activity ([place])     Sepsis ([place])     Thyroid disease     Wears dentures      No family history on file.      Past Surgical History:  Procedure Laterality Date   AMPUTATION Left 05/21/2015    Procedure: Left Foot 3rd Ray Amputation;  Surgeon: Newt Minion, MD;  Location: [place];  Service: Orthopedics;  Laterality: Left;   AV FISTULA PLACEMENT Left 03/28/2016    Procedure: ARTERIOVENOUS (AV) FISTULA CREATION LEFT ARM;  Surgeon: Waynetta Sandy, MD;  Location: [place];  Service: Vascular;  Laterality: Left;   AV FISTULA PLACEMENT Right 06/04/2019    Procedure: ARTERIOVENOUS (AV) FISTULA CREATION RIGHT ARM;  Surgeon: Waynetta Sandy, MD;  Location: [facility];  Service: Vascular;  Laterality: Right;   DILATION AND CURETTAGE OF UTERUS       EYE SURGERY       FISTULA SUPERFICIALIZATION Left 09/04/2016    Procedure: FISTULA SUPERFICIALIZATION LEFT UPPER ARM;  Surgeon: Waynetta Sandy, MD;  Location: [place];  Service: Vascular;  Laterality: Left;   FISTULA SUPERFICIALIZATION Right 10/08/2019    Procedure: FISTULA SUPERFICIALIZATION OR RIGHT ARM;  Surgeon:  Waynetta Sandy, MD;  Location: [facility];  Service: Vascular;  Laterality: Right;   IR AV DIALY SHUNT INTRO NEEDLE/INTRAC INITIAL W/PTA/STENT/IMG LT Left 06/13/2017   IR AV DIALY SHUNT INTRO NEEDLE/INTRAC INITIAL W/PTA/STENT/IMG LT Left 09/12/2017   IR AV DIALY SHUNT INTRO NEEDLE/INTRACATH INITIAL W/PTA/IMG LEFT   11/10/2016   IR AV DIALY SHUNT INTRO NEEDLE/INTRACATH INITIAL W/PTA/IMG LEFT   03/09/2017   IR AV DIALY SHUNT INTRO NEEDLE/INTRACATH INITIAL W/PTA/IMG LEFT   12/12/2017   IR AV DIALY SHUNT INTRO NEEDLE/INTRACATH INITIAL W/PTA/IMG LEFT   02/27/2018   IR AV DIALY SHUNT INTRO NEEDLE/INTRACATH INITIAL W/PTA/IMG LEFT   06/05/2018   IR AV DIALY SHUNT INTRO NEEDLE/INTRACATH INITIAL W/PTA/IMG LEFT   09/11/2018   IR AV DIALY SHUNT INTRO NEEDLE/INTRACATH INITIAL W/PTA/IMG RIGHT Right 04/21/2020   IR AV DIALY SHUNT INTRO NEEDLE/INTRACATH INITIAL W/PTA/IMG RIGHT Right 10/08/2020   IR DIALY SHUNT INTRO NEEDLE/INTRACATH INITIAL W/IMG LEFT Left 05/08/2018   IR FLUORO GUIDE CV LINE RIGHT   02/01/2019   IR FLUORO GUIDE CV LINE RIGHT   07/14/2020   IR FLUORO GUIDE CV LINE RIGHT   12/29/2020   IR GENERIC HISTORICAL   08/01/2016    IR FLUORO GUIDE CV LINE RIGHT 08/01/2016 [name], MD  [facility]   IR GENERIC HISTORICAL   08/01/2016    IR US GUIDE VASC ACCESS RIGHT 08/01/2016 [name], MD [facility]   IR REMOVAL TUN CV CATH W/O FL   11/01/2016   IR REMOVAL TUN CV CATH W/O FL   01/02/2020   IR THROMBECTOMY AV FISTULA W/THROMBOLYSIS INC/SHUNT/IMG LEFT Left 01/31/2019   IR THROMBECTOMY AV FISTULA W/THROMBOLYSIS/PTA INC/SHUNT/IMG LEFT Left 01/27/2019   IR THROMBECTOMY AV FISTULA W/THROMBOLYSIS/PTA INC/SHUNT/IMG LEFT Left 07/16/2020   IR THROMBECTOMY AV FISTULA W/THROMBOLYSIS/PTA/STENT INC/SHUNT/IMG RT Right 09/24/2020   IR US GUIDE VASC ACCESS LEFT   11/10/2016   IR US GUIDE VASC ACCESS LEFT   09/12/2017   IR US GUIDE VASC ACCESS LEFT   12/12/2017   IR US GUIDE VASC ACCESS LEFT   06/05/2018   IR US GUIDE VASC ACCESS  LEFT   09/11/2018   IR US GUIDE VASC ACCESS LEFT   01/27/2019   IR US GUIDE VASC ACCESS LEFT   02/01/2019   IR US GUIDE VASC ACCESS RIGHT   02/01/2019   IR US GUIDE VASC ACCESS RIGHT   07/14/2020   IR US GUIDE VASC ACCESS RIGHT   07/16/2020   IR US GUIDE VASC ACCESS RIGHT   10/08/2020   IR US GUIDE VASC ACCESS RIGHT   12/29/2020      Short Social History:  Social History         Tobacco Use   Smoking status: Never   Smokeless tobacco: Never  Substance Use Topics   Alcohol use: Not Currently      Alcohol/week: 0.0 standard drinks      Comment: rare- a beer every now and then      No Known Allergies         Current Outpatient Medications  Medication Sig Dispense Refill   Amino Acids-Protein Hydrolys (FEEDING SUPPLEMENT, PRO-STAT SUGAR FREE 64,) LIQD Take 30 mLs by mouth 2 (two) times daily. 900 mL 0   atorvastatin (LIPITOR) 40 MG tablet TAKE 1 TABLET (40 MG TOTAL) BY MOUTH DAILY. 30 tablet 6   B Complex-C-Zn-Folic Acid (DIALYVITE Q000111Q WITH ZINC) 0.8 MG TABS Take 1 tablet by mouth daily.       Blood Glucose Monitoring Suppl (TRUE METRIX METER) DEVI 1 each by Does not apply route 3 (three) times daily before meals. 1 Device 0   cetirizine (ZYRTEC) 10 MG tablet Take 1 tablet (10 mg total) by mouth daily. 30 tablet 1   cinacalcet (SENSIPAR) 60 MG tablet Take 60 mg by mouth daily.       diphenoxylate-atropine (LOMOTIL) 2.5-0.025 MG tablet Take 1 tablet by mouth 3 (three) times daily as needed for diarrhea or loose stools. 30 tablet 0   DULoxetine (CYMBALTA) 60 MG capsule TAKE 1 CAPSULE (60 MG TOTAL) BY MOUTH DAILY. 30 capsule 6   ethyl chloride spray SPRAY 1 SPRAY TO THE SKIN 3 TIMES A WEEK AS NEEDED. SPRAY A SMALL AMOUNT JUST PRIOR TO NEEDLE INSERTION. 116 mL 0   gabapentin (NEURONTIN) 300 MG capsule TAKE 1 CAPSULE (300 MG TOTAL) BY MOUTH AT BEDTIME. 30 capsule 2   glucose blood (TRUE METRIX BLOOD GLUCOSE TEST) test strip Use 3 times daily before meals 100 each 3   insulin glargine (LANTUS) 100  UNIT/ML injection INJECT SUBCUTANEOUSLY TWICE DAILY 15 UNITS IN THE MORNING AND 10 IN THE EVENING 30 mL 6   Insulin Syringe-Needle U-100 (TRUEPLUS INSULIN SYRINGE) 31G X 5/16" 1 ML MISC USE AS DIRECTED 100 each 5  lisinopril (ZESTRIL) 20 MG tablet TAKE 1 TABLET (20 MG TOTAL) BY MOUTH DAILY. 30 tablet 6   methocarbamol (ROBAXIN) 500 MG tablet TAKE 1 TABLET (500 MG TOTAL) BY MOUTH 2 (TWO) TIMES DAILY AS NEEDED FOR MUSCLE SPASMS. 30 tablet 2   mupirocin ointment (BACTROBAN) 2 % APPLY 1 APPLICATION TOPICALLY 2 (TWO) TIMES DAILY. 22 g 2   Nutritional Supplements (FEEDING SUPPLEMENT, NEPRO CARB STEADY,) LIQD Take 237 mLs by mouth Every Tuesday,Thursday,and Saturday with dialysis.        omeprazole (PRILOSEC) 20 MG capsule TAKE 1 CAPSULE (20 MG TOTAL) BY MOUTH DAILY. 30 capsule 3   sucroferric oxyhydroxide (VELPHORO) 500 MG chewable tablet Chew 500 mg by mouth 2 (two) times daily with a meal.        TRUEplus Lancets 28G MISC 1 each by Other route 3 (three) times daily. 100 each 11    No current facility-administered medications for this visit.             Facility-Administered Medications Ordered in Other Visits  Medication Dose Route Frequency Provider Last Rate Last Admin   0.9 %  sodium chloride infusion   Intravenous Continuous Monia Sabal, PA-C          Review of Systems  HENT: HENT negative.  Eyes: Eyes negative.  Cardiovascular: Cardiovascular negative.  GI: Positive for abdominal pain.  Musculoskeletal: Positive for leg pain.  Neurological: Neurological negative. Hematologic: Hematologic/lymphatic negative.  Psychiatric: Psychiatric negative.         Objective:    Vitals:   03/11/21 0757 03/11/21 0946  BP: (!) 220/61 (!) 205/45  Pulse: 66 (!) 55  Resp: 18 17  Temp: 98 F (36.7 C)   SpO2: 95% 100%      Physical Exam HENT:     Head: Normocephalic.     Nose:     Comments: Wearing mask Neck:     Comments: Right IJ catheter in place Cardiovascular:     Pulses:           Radial pulses are 1+ on the right side and 1+ on the left side.  Abdominal:     General: Abdomen is flat.     Palpations: Abdomen is soft. There is no mass.  Musculoskeletal:        General: Normal range of motion.     Cervical back: Normal range of motion.  Skin:    General: Skin is warm and dry.     Capillary Refill: Capillary refill takes less than 2 seconds.  Neurological:     General: No focal deficit present.     Mental Status: She is alert.      Data: +-----------------+-------------+----------+--------+  Right Basilic    Diameter (cm)Depth (cm)Findings  +-----------------+-------------+----------+--------+  Dist upper arm       0.25                         +-----------------+-------------+----------+--------+  Antecubital fossa    0.22                         +-----------------+-------------+----------+--------+   Summary:    Left: Patent but small basilic vein.   Right Pre-Dialysis Findings:  +----------------------+----------+------------------+---------+-----------  ----+  Location              PSV (cm/s)Intralum. DiamTressia Danas Comments                                          (  cm)                                         +----------------------+----------+------------------+---------+-----------  ----+  Brachial Antecub.     74        0.37              biphasic                   fossa                                                                        +----------------------+----------+------------------+---------+-----------  ----+  Radial Art at Wrist   56        0.05                triphasicatherosclerotic  +----------------------+----------+------------------+---------+-----------  ----+  Ulnar Art at Wrist    58        0.17                triphasicatherosclerotic  +----------------------+----------+------------------+---------+-----------  ----+          Assessment/Plan:         70 year old female with history of bilateral upper extremity dialysis accesses that have now failed.  She is currently dialyzing via catheter.  I discussed the options being proceeding to left upper extremity av graft. Her and her son agree via interpreter today.        Waynetta Sandy MD [facility]

## 2021-03-11 NOTE — Progress Notes (Signed)
Dr. Doroteo Glassman notified about high BP. No orders at this time.Will continue to monitor.

## 2021-03-11 NOTE — Anesthesia Postprocedure Evaluation (Signed)
Anesthesia Post Note  Patient: Marnisha Gelber  Procedure(s) Performed: INSERTION OF LEFT ARM ARTERIOVENOUS (AV) GORE-TEX GRAFT (Left)     Patient location during evaluation: PACU Anesthesia Type: Regional and MAC Level of consciousness: awake and alert Pain management: pain level controlled Vital Signs Assessment: post-procedure vital signs reviewed and stable Respiratory status: spontaneous breathing, nonlabored ventilation and respiratory function stable Cardiovascular status: blood pressure returned to baseline and stable Postop Assessment: no apparent nausea or vomiting Anesthetic complications: no   No notable events documented.  Last Vitals:  Vitals:   03/11/21 1415 03/11/21 1430  BP: (!) 110/42 (!) 122/45  Pulse: 62 60  Resp: 13 17  Temp:  36.6 C  SpO2: 97% 97%    Last Pain:  Vitals:   03/11/21 1415  TempSrc:   PainSc: 0-No pain                 Pervis Hocking

## 2021-03-11 NOTE — Anesthesia Preprocedure Evaluation (Addendum)
Anesthesia Evaluation  Patient identified by MRN, date of birth, ID band Patient awake    Reviewed: Allergy & Precautions, NPO status , Patient's Chart, lab work & pertinent test results  Airway Mallampati: II  TM Distance: >3 FB Neck ROM: Full    Dental no notable dental hx. (+) Dental Advisory Given   Pulmonary neg pulmonary ROS,    Pulmonary exam normal breath sounds clear to auscultation       Cardiovascular hypertension, Pt. on medications Normal cardiovascular exam Rhythm:Regular Rate:Normal     Neuro/Psych PSYCHIATRIC DISORDERS Anxiety Depression Dementia negative neurological ROS     GI/Hepatic Neg liver ROS, GERD  Medicated and Controlled,  Endo/Other  diabetes, Well Controlled, Type 2, Insulin Dependenta1c 8.3  Renal/GU ESRF and DialysisRenal disease (HD T/Th/Sat)K 5.0 Last HD yesterday through HD catheter  negative genitourinary   Musculoskeletal negative musculoskeletal ROS (+)   Abdominal   Peds  Hematology   Anesthesia Other Findings   Reproductive/Obstetrics negative OB ROS                            Anesthesia Physical Anesthesia Plan  ASA: 3  Anesthesia Plan: MAC and Regional   Post-op Pain Management:    Induction: Intravenous  PONV Risk Score and Plan: 2 and Propofol infusion, TIVA and Treatment may vary due to age or medical condition  Airway Management Planned: Natural Airway and Simple Face Mask  Additional Equipment: None  Intra-op Plan:   Post-operative Plan:   Informed Consent: I have reviewed the patients History and Physical, chart, labs and discussed the procedure including the risks, benefits and alternatives for the proposed anesthesia with the patient or authorized representative who has indicated his/her understanding and acceptance.     Dental advisory given, Consent reviewed with POA and Interpreter used for interveiw  Plan Discussed with:  CRNA  Anesthesia Plan Comments: (Son at bedside w/ interpreter )       Anesthesia Quick Evaluation

## 2021-03-11 NOTE — Progress Notes (Signed)
Dr. Doroteo Glassman aware of BP

## 2021-03-14 ENCOUNTER — Encounter (HOSPITAL_COMMUNITY): Payer: Self-pay | Admitting: Vascular Surgery

## 2021-03-14 ENCOUNTER — Other Ambulatory Visit: Payer: Self-pay

## 2021-03-16 ENCOUNTER — Ambulatory Visit: Payer: Self-pay | Admitting: Family Medicine

## 2021-03-18 ENCOUNTER — Other Ambulatory Visit: Payer: Self-pay

## 2021-03-18 MED FILL — Duloxetine HCl Enteric Coated Pellets Cap 60 MG (Base Eq): ORAL | 30 days supply | Qty: 30 | Fill #4 | Status: AC

## 2021-03-28 ENCOUNTER — Other Ambulatory Visit: Payer: Self-pay | Admitting: Family Medicine

## 2021-03-28 ENCOUNTER — Other Ambulatory Visit: Payer: Self-pay

## 2021-03-28 DIAGNOSIS — I10 Essential (primary) hypertension: Secondary | ICD-10-CM

## 2021-03-28 MED ORDER — LISINOPRIL 20 MG PO TABS
ORAL_TABLET | Freq: Every day | ORAL | 5 refills | Status: DC
  Filled 2021-03-28: qty 30, 30d supply, fill #0
  Filled 2021-04-19: qty 30, 30d supply, fill #1
  Filled 2021-05-20: qty 30, 30d supply, fill #2
  Filled 2021-06-28: qty 30, 30d supply, fill #3
  Filled 2021-07-28: qty 30, 30d supply, fill #4
  Filled 2021-07-28: qty 30, 30d supply, fill #0
  Filled 2021-08-25: qty 30, 30d supply, fill #1

## 2021-03-29 ENCOUNTER — Other Ambulatory Visit: Payer: Self-pay

## 2021-04-04 ENCOUNTER — Other Ambulatory Visit: Payer: Self-pay

## 2021-04-08 MED FILL — Insulin Glargine Inj 100 Unit/ML: SUBCUTANEOUS | 28 days supply | Qty: 10 | Fill #4 | Status: AC

## 2021-04-11 ENCOUNTER — Other Ambulatory Visit: Payer: Self-pay

## 2021-04-12 ENCOUNTER — Other Ambulatory Visit: Payer: Self-pay

## 2021-04-19 ENCOUNTER — Other Ambulatory Visit: Payer: Self-pay | Admitting: Physician Assistant

## 2021-04-19 DIAGNOSIS — E1149 Type 2 diabetes mellitus with other diabetic neurological complication: Secondary | ICD-10-CM

## 2021-04-20 ENCOUNTER — Other Ambulatory Visit: Payer: Self-pay

## 2021-04-22 ENCOUNTER — Other Ambulatory Visit: Payer: Self-pay

## 2021-04-27 ENCOUNTER — Other Ambulatory Visit: Payer: Self-pay

## 2021-04-27 ENCOUNTER — Other Ambulatory Visit: Payer: Self-pay | Admitting: Pharmacist

## 2021-04-27 DIAGNOSIS — E1149 Type 2 diabetes mellitus with other diabetic neurological complication: Secondary | ICD-10-CM

## 2021-04-27 MED ORDER — DULOXETINE HCL 60 MG PO CPEP
ORAL_CAPSULE | Freq: Every day | ORAL | 2 refills | Status: DC
  Filled 2021-04-27: qty 30, 30d supply, fill #0
  Filled 2021-05-20: qty 30, 30d supply, fill #1
  Filled 2021-06-28: qty 30, 30d supply, fill #2

## 2021-04-28 ENCOUNTER — Other Ambulatory Visit: Payer: Self-pay

## 2021-04-29 ENCOUNTER — Other Ambulatory Visit (HOSPITAL_COMMUNITY): Payer: Self-pay | Admitting: Nephrology

## 2021-04-29 DIAGNOSIS — N189 Chronic kidney disease, unspecified: Secondary | ICD-10-CM

## 2021-05-03 ENCOUNTER — Other Ambulatory Visit: Payer: Self-pay

## 2021-05-04 ENCOUNTER — Other Ambulatory Visit: Payer: Self-pay

## 2021-05-04 ENCOUNTER — Ambulatory Visit (HOSPITAL_COMMUNITY)
Admission: RE | Admit: 2021-05-04 | Discharge: 2021-05-04 | Disposition: A | Discharge status: Home / Self Care | Payer: Self-pay | Source: Ambulatory Visit | Attending: Nephrology | Admitting: Nephrology

## 2021-05-04 DIAGNOSIS — N189 Chronic kidney disease, unspecified: Secondary | ICD-10-CM

## 2021-05-04 DIAGNOSIS — Z4901 Encounter for fitting and adjustment of extracorporeal dialysis catheter: Secondary | ICD-10-CM | POA: Insufficient documentation

## 2021-05-04 HISTORY — PX: IR REMOVAL TUN CV CATH W/O FL: IMG2289

## 2021-05-04 MED ORDER — CHLORHEXIDINE GLUCONATE 4 % EX LIQD
CUTANEOUS | Status: AC
  Filled 2021-05-04: qty 15

## 2021-05-04 MED ORDER — LIDOCAINE HCL (PF) 1 % IJ SOLN
INTRAMUSCULAR | Status: DC | PRN
  Administered 2021-05-04: 10 mL

## 2021-05-04 MED ORDER — LIDOCAINE HCL 1 % IJ SOLN
INTRAMUSCULAR | Status: AC
  Filled 2021-05-04: qty 20

## 2021-05-04 NOTE — Procedures (Signed)
Interventional Radiology Procedure Note  PROCEDURE SUMMARY:  Successful removal of tunneled catheter.  Catheter intact.   No complications.   EBL = trace  Please see full dictation in imaging section of Epic for procedure details.  Electronically Signed: Tyson Alias, AGNP 05/04/2021, 12:08 PM

## 2021-05-09 ENCOUNTER — Other Ambulatory Visit: Payer: Self-pay

## 2021-05-10 ENCOUNTER — Other Ambulatory Visit: Payer: Self-pay

## 2021-05-13 ENCOUNTER — Other Ambulatory Visit: Payer: Self-pay

## 2021-05-16 ENCOUNTER — Other Ambulatory Visit: Payer: Self-pay | Admitting: Family Medicine

## 2021-05-16 ENCOUNTER — Other Ambulatory Visit: Payer: Self-pay

## 2021-05-16 NOTE — Telephone Encounter (Signed)
Requested medications are due for refill today yes  Requested medications are on the active medication list yes  Last refill 12/27/20  Last visit 02/14/21  Future visit scheduled 08/17/21  Notes to clinic There is not a protocol to review for this med, please assess. Requested Prescriptions  Pending Prescriptions Disp Refills   ethyl chloride spray 116 mL 0    Sig: SPRAY 1 SPRAY TO THE SKIN 3 TIMES A WEEK AS NEEDED. SPRAY A SMALL AMOUNT JUST PRIOR TO NEEDLE INSERTION.     Off-Protocol Failed - 05/16/2021  7:21 PM      Failed - Medication not assigned to a protocol, review manually.      Passed - Valid encounter within last 12 months    Recent Outpatient Visits           3 months ago Type 2 diabetes mellitus with chronic kidney disease on chronic dialysis, with long-term current use of insulin ([place])   [place], [place], MD   9 months ago Muscle cramp   [place], Charlane Ferretti, MD   11 months ago Type 2 diabetes mellitus with chronic kidney disease on chronic dialysis, with long-term current use of insulin ([place])   [place], Cari S, PA-C   1 year ago Type 2 diabetes mellitus with chronic kidney disease on chronic dialysis, with long-term current use of insulin ([place])   [place], [place], MD   2 years ago Type 2 diabetes mellitus with chronic kidney disease on chronic dialysis, with long-term current use of insulin ([place])   [place], [name], MD       Future Appointments             In 3 weeks Wynetta Emery, Dalbert Batman, MD [place]   In 3 months [name], MD [place]

## 2021-05-17 ENCOUNTER — Other Ambulatory Visit: Payer: Self-pay

## 2021-05-19 MED ORDER — ETHYL CHLORIDE EX AERO
INHALATION_SPRAY | CUTANEOUS | 0 refills | Status: DC
  Filled 2021-05-19: qty 116, 30d supply, fill #0

## 2021-05-20 ENCOUNTER — Other Ambulatory Visit: Payer: Self-pay

## 2021-05-23 ENCOUNTER — Other Ambulatory Visit: Payer: Self-pay

## 2021-05-25 ENCOUNTER — Other Ambulatory Visit: Payer: Self-pay

## 2021-05-25 MED FILL — Insulin Glargine Inj 100 Unit/ML: SUBCUTANEOUS | 28 days supply | Qty: 10 | Fill #5 | Status: AC

## 2021-06-08 ENCOUNTER — Other Ambulatory Visit (HOSPITAL_COMMUNITY): Payer: Self-pay | Admitting: Nephrology

## 2021-06-08 DIAGNOSIS — N186 End stage renal disease: Secondary | ICD-10-CM

## 2021-06-09 ENCOUNTER — Other Ambulatory Visit: Payer: Self-pay

## 2021-06-10 ENCOUNTER — Ambulatory Visit: Payer: Self-pay | Attending: Internal Medicine | Admitting: Internal Medicine

## 2021-06-10 ENCOUNTER — Other Ambulatory Visit: Payer: Self-pay

## 2021-06-10 VITALS — BP 182/65 | HR 63

## 2021-06-10 DIAGNOSIS — I10 Essential (primary) hypertension: Secondary | ICD-10-CM

## 2021-06-10 DIAGNOSIS — E1122 Type 2 diabetes mellitus with diabetic chronic kidney disease: Secondary | ICD-10-CM

## 2021-06-10 DIAGNOSIS — E1149 Type 2 diabetes mellitus with other diabetic neurological complication: Secondary | ICD-10-CM

## 2021-06-10 DIAGNOSIS — Z992 Dependence on renal dialysis: Secondary | ICD-10-CM

## 2021-06-10 DIAGNOSIS — N186 End stage renal disease: Secondary | ICD-10-CM

## 2021-06-10 DIAGNOSIS — Z794 Long term (current) use of insulin: Secondary | ICD-10-CM

## 2021-06-10 LAB — GLUCOSE, POCT (MANUAL RESULT ENTRY): POC Glucose: 247 mg/dl — AB (ref 70–99)

## 2021-06-10 MED ORDER — AMLODIPINE BESYLATE 2.5 MG PO TABS
2.5000 mg | ORAL_TABLET | Freq: Every day | ORAL | 4 refills | Status: DC
  Filled 2021-06-10 – 2021-07-08 (×2): qty 30, 30d supply, fill #0

## 2021-06-10 MED ORDER — GABAPENTIN 300 MG PO CAPS
ORAL_CAPSULE | Freq: Every day | ORAL | 2 refills | Status: DC
  Filled 2021-06-10: qty 30, 30d supply, fill #0
  Filled 2021-07-19: qty 30, 30d supply, fill #1
  Filled 2021-07-19: qty 30, 30d supply, fill #0
  Filled 2021-08-19: qty 30, 30d supply, fill #1

## 2021-06-10 NOTE — Progress Notes (Signed)
Patient ID: Sarah Kelley, female    DOB: 12-31-1950  MRN: [id number]  CC: Diabetes   Subjective: Sarah Kelley is a 70 y.o. female who presents for refill meds and f/u.  Son is with her and provides hx.  PCP is Dr. 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  DM:  Results for orders placed or performed in visit on 06/10/21  POCT glucose (manual entry)  Result Value Ref Range   POC Glucose 247 (A) 70 - 99 mg/dl  not checking BS regularly because does not like to have finger sticks.  Pt uninsured so CGM not an option for her Taking lantus 18units Q a.m/6-7units Qp.m. No recent low blood sugar Eating okay Requests refill on gabapentin.  HTN/ESRD on HD:  took Lisinopril already for today and compliant with taking it daily. No CP/SOB No device at home to check BP but checked in HD She limits salt in foods She attends her dialysis sessions 3 days a week.  HM: reports being given flu shot already at her HD ctr.    Patient Active Problem List   Diagnosis Date Noted   Uremia 07/14/2020   Hyperkalemia 07/14/2020   Hypoglycemia    Elevated liver enzymes 06/14/2020   Other constipation 06/13/2020   AVF (arteriovenous fistula) (HCC)    MSSA (methicillin susceptible Staphylococcus aureus) infection    Nausea & vomiting 02/11/2018   Insomnia 01/18/2017   Altered mental status    Goals of care, counseling/discussion    Dysphagia 12/04/2016   UTI (urinary tract infection) 12/04/2016   Severe sepsis ([place]) 12/02/2016   Hyperglycemia 12/02/2016   Acute encephalopathy 07/28/2016   CAP (community acquired pneumonia) 07/28/2016   Pneumonia 07/28/2016   ESRD (end stage renal disease) ([place])    Phantom pain ([place]) 09/13/2015   Hyperlipidemia 06/01/2015    Toe amputation status    Insulin-requiring or dependent type II diabetes mellitus ([place])    Chronic kidney disease (CKD) stage G4/A1, severely decreased glomerular filtration rate (GFR) between 15-29 mL/min/1.73 square meter and albuminuria creatinine ratio less than 30 mg/g ([place]) 05/18/2015   Type 2 diabetes mellitus with chronic kidney disease on chronic dialysis, with long-term current use of insulin ([place]) 05/18/2015   Osteomyelitis ([place]) 05/18/2015   Generalized abdominal pain 05/18/2015   Osteomyelitis of left foot ([place]) 05/18/2015   Diabetic neuropathy ([place]) 01/14/2007   Diabetes mellitus without complication ([place]) 91/63/8466   HLD (hyperlipidemia) 01/14/2007   Normocytic anemia 01/14/2007   ANXIETY 01/14/2007   Depression 01/14/2007   Essential hypertension 01/14/2007     Current Outpatient Medications on File Prior to Visit  Medication Sig Dispense Refill   atorvastatin (LIPITOR) 40 MG tablet TAKE 1 TABLET (40 MG TOTAL) BY MOUTH DAILY. 30 tablet 6   Blood Glucose Monitoring Suppl (TRUE METRIX METER) DEVI 1 each by Does not apply route 3 (three) times daily before meals. 1 Device 0   cetirizine (ZYRTEC) 10 MG tablet Take 1 tablet (10 mg total) by mouth daily. 30 tablet 1   DULoxetine (CYMBALTA) 60 MG capsule TAKE 1 CAPSULE (60 MG TOTAL) BY MOUTH DAILY. 30 capsule 2   ethyl chloride spray SPRAY 1 SPRAY TO THE SKIN 3 TIMES A WEEK AS NEEDED. SPRAY A SMALL AMOUNT JUST PRIOR TO NEEDLE INSERTION. 116 mL 0   glucose blood (TRUE  METRIX BLOOD GLUCOSE TEST) test strip Use 3 times daily before meals 100 each 3   insulin glargine (LANTUS) 100 UNIT/ML injection INJECT SUBCUTANEOUSLY TWICE DAILY 15 UNITS IN THE MORNING AND 10 IN THE EVENING (Patient taking differently: Inject 5-18 Units into the skin See admin instructions. Inject 18 units subcutaneously in the morning & inject 5-12 units subcutaneously in the afternoon.) 30 mL 6   Insulin Syringe-Needle U-100 (TRUEPLUS INSULIN SYRINGE) 31G X  5/16" 1 ML MISC USE AS DIRECTED 100 each 5   lisinopril (ZESTRIL) 20 MG tablet TAKE 1 TABLET (20 MG TOTAL) BY MOUTH DAILY. 30 tablet 5   methocarbamol (ROBAXIN) 500 MG tablet TAKE 1 TABLET (500 MG TOTAL) BY MOUTH 2 (TWO) TIMES DAILY AS NEEDED FOR MUSCLE SPASMS. 30 tablet 2   Nutritional Supplements (FEEDING SUPPLEMENT, NEPRO CARB STEADY,) LIQD Take 237 mLs by mouth See admin instructions. Take 1 can (237 ml) by mouth once daily on Tuesdays, Thursdays & Saturday morning. May take an additional can (237 ml) by mouth if needed for poor appetite.     TRUEplus Lancets 28G MISC 1 each by Other route 3 (three) times daily. 100 each 11   omeprazole (PRILOSEC) 20 MG capsule TAKE 1 CAPSULE (20 MG TOTAL) BY MOUTH DAILY. (Patient not taking: Reported on 06/10/2021) 30 capsule 3   Current Facility-Administered Medications on File Prior to Visit  Medication Dose Route Frequency Provider Last Rate Last Admin   0.9 %  sodium chloride infusion   Intravenous Continuous Monia Sabal, PA-C        No Known Allergies  Social History   Socioeconomic History   Marital status: Widowed    Spouse name: Not on file   Number of children: 11   Years of education: no schooling   Highest education level: Not on file  Occupational History   Not on file  Tobacco Use   Smoking status: Never   Smokeless tobacco: Never  Vaping Use   Vaping Use: Never used  Substance and Sexual Activity   Alcohol use: Not Currently    Alcohol/week: 0.0 standard drinks    Comment: rare- a beer every now and then   Drug use: No   Sexual activity: Not Currently  Other Topics Concern   Not on file  Social History Narrative   Lives with son Marcial    Right handed   Caffeine: every once in a while a little coffee, chamomile tea, or soda   Social Determinants of Health   Financial Resource Strain: Not on file  Food Insecurity: Not on file  Transportation Needs: Not on file  Physical Activity: Not on file  Stress: Not on file   Social Connections: Not on file  Intimate Partner Violence: Not on file    No family history on file.  Past Surgical History:  Procedure Laterality Date   AMPUTATION Left 05/21/2015   Procedure: Left Foot 3rd Ray Amputation;  Surgeon: Newt Minion, MD;  Location: [facility];  Service: Orthopedics;  Laterality: Left;   AV FISTULA PLACEMENT Left 03/28/2016   Procedure: ARTERIOVENOUS (AV) FISTULA CREATION LEFT ARM;  Surgeon: Waynetta Sandy, MD;  Location: [facility];  Service: Vascular;  Laterality: Left;   AV FISTULA PLACEMENT Right 06/04/2019   Procedure: ARTERIOVENOUS (AV) FISTULA CREATION RIGHT ARM;  Surgeon: Waynetta Sandy, MD;  Location: [facility];  Service: Vascular;  Laterality: Right;   AV FISTULA PLACEMENT Left 03/11/2021   Procedure: INSERTION OF LEFT ARM ARTERIOVENOUS (AV) GORE-TEX GRAFT;  Surgeon: Waynetta Sandy, MD;  Location: [facility];  Service: Vascular;  Laterality: Left;   DILATION AND CURETTAGE OF UTERUS     EYE SURGERY     FISTULA SUPERFICIALIZATION Left 09/04/2016   Procedure: FISTULA SUPERFICIALIZATION LEFT UPPER ARM;  Surgeon: Waynetta Sandy, MD;  Location: [facility];  Service: Vascular;  Laterality: Left;   FISTULA SUPERFICIALIZATION Right 10/08/2019   Procedure: FISTULA SUPERFICIALIZATION OR RIGHT ARM;  Surgeon: Waynetta Sandy, MD;  Location: [facility];  Service: Vascular;  Laterality: Right;   IR AV DIALY SHUNT INTRO NEEDLE/INTRAC INITIAL W/PTA/STENT/IMG LT Left 06/13/2017   IR AV DIALY SHUNT INTRO NEEDLE/INTRAC INITIAL W/PTA/STENT/IMG LT Left 09/12/2017   IR AV DIALY SHUNT INTRO NEEDLE/INTRACATH INITIAL W/PTA/IMG LEFT  11/10/2016   IR AV DIALY SHUNT INTRO NEEDLE/INTRACATH INITIAL W/PTA/IMG LEFT  03/09/2017   IR AV DIALY SHUNT INTRO NEEDLE/INTRACATH INITIAL W/PTA/IMG LEFT  12/12/2017   IR AV DIALY SHUNT INTRO NEEDLE/INTRACATH INITIAL W/PTA/IMG LEFT  02/27/2018   IR AV DIALY SHUNT INTRO NEEDLE/INTRACATH INITIAL W/PTA/IMG LEFT  06/05/2018   IR AV  DIALY SHUNT INTRO NEEDLE/INTRACATH INITIAL W/PTA/IMG LEFT  09/11/2018   IR AV DIALY SHUNT INTRO NEEDLE/INTRACATH INITIAL W/PTA/IMG RIGHT Right 04/21/2020   IR AV DIALY SHUNT INTRO NEEDLE/INTRACATH INITIAL W/PTA/IMG RIGHT Right 10/08/2020   IR DIALY SHUNT INTRO NEEDLE/INTRACATH INITIAL W/IMG LEFT Left 05/08/2018   IR FLUORO GUIDE CV LINE RIGHT  02/01/2019   IR FLUORO GUIDE CV LINE RIGHT  07/14/2020   IR FLUORO GUIDE CV LINE RIGHT  12/29/2020   IR GENERIC HISTORICAL  08/01/2016   IR FLUORO GUIDE CV LINE RIGHT 08/01/2016 [name], MD [facility]   IR GENERIC HISTORICAL  08/01/2016   IR US GUIDE VASC ACCESS RIGHT 08/01/2016 [name], MD [facility]   IR REMOVAL TUN CV CATH W/O FL  11/01/2016   IR REMOVAL TUN CV CATH W/O FL  01/02/2020   IR REMOVAL TUN CV CATH W/O FL  05/04/2021   IR THROMBECTOMY AV FISTULA W/THROMBOLYSIS INC/SHUNT/IMG LEFT Left 01/31/2019   IR THROMBECTOMY AV FISTULA W/THROMBOLYSIS/PTA INC/SHUNT/IMG LEFT Left 01/27/2019   IR THROMBECTOMY AV FISTULA W/THROMBOLYSIS/PTA INC/SHUNT/IMG LEFT Left 07/16/2020   IR THROMBECTOMY AV FISTULA W/THROMBOLYSIS/PTA/STENT INC/SHUNT/IMG RT Right 09/24/2020   IR US GUIDE VASC ACCESS LEFT  11/10/2016   IR US GUIDE VASC ACCESS LEFT  09/12/2017   IR US GUIDE VASC ACCESS LEFT  12/12/2017   IR US GUIDE VASC ACCESS LEFT  06/05/2018   IR US GUIDE VASC ACCESS LEFT  09/11/2018   IR US GUIDE VASC ACCESS LEFT  01/27/2019   IR US GUIDE VASC ACCESS LEFT  02/01/2019   IR US GUIDE VASC ACCESS RIGHT  02/01/2019   IR US GUIDE VASC ACCESS RIGHT  07/14/2020   IR US GUIDE VASC ACCESS RIGHT  07/16/2020   IR US GUIDE VASC ACCESS RIGHT  10/08/2020   IR US GUIDE VASC ACCESS RIGHT  12/29/2020   UPPER EXTREMITY VENOGRAPHY Bilateral 02/28/2021   Procedure: UPPER EXTREMITY VENOGRAPHY;  Surgeon: Waynetta Sandy, MD;  Location: [facility];  Service: Cardiovascular;  Laterality: Bilateral;    ROS: Review of Systems Negative except as stated above  PHYSICAL EXAM: BP (!) 182/65    Pulse 63   LMP  (LMP Unknown)   SpO2 100%   Physical Exam Repeat blood pressure 180/73 General appearance -frail appearing elderly female in wheelchair in NAD Mental status -patient answers some questions appropriately Chest - clear to auscultation, no wheezes, rales or  rhonchi, symmetric air entry Heart -regular rate rhythm Extremities -no lower extremity edema   CMP Latest Ref Rng & Units 03/11/2021 02/28/2021 12/29/2020  Glucose 70 - 99 mg/dL 151(H) 72 64(L)  BUN 8 - 23 mg/dL 47(H) 50(H) >130(H)  Creatinine 0.44 - 1.00 mg/dL 6.10(H) 7.20(H) 16.70(H)  Sodium 135 - 145 mmol/L 136 138 134(L)  Potassium 3.5 - 5.1 mmol/L 5.0 4.9 6.4(HH)  Chloride 98 - 111 mmol/L 103 99 101  CO2 22 - 32 mmol/L - - -  Calcium 8.9 - 10.3 mg/dL - - -  Total Protein 6.0 - 8.5 g/dL - - -  Total Bilirubin 0.0 - 1.2 mg/dL - - -  Alkaline Phos 44 - 121 IU/L - - -  AST 0 - 40 IU/L - - -  ALT 0 - 32 IU/L - - -   Lipid Panel     Component Value Date/Time   CHOL 195 02/14/2021 1107   TRIG 125 02/14/2021 1107   HDL 49 02/14/2021 1107   CHOLHDL 4.0 02/14/2021 1107   CHOLHDL 4.9 01/13/2016 1051   VLDL 66 (H) 01/13/2016 1051   LDLCALC 124 (H) 02/14/2021 1107    CBC    Component Value Date/Time   WBC 4.5 10/08/2020 0907   RBC 3.67 (L) 10/08/2020 0907   HGB 12.2 03/11/2021 0756   HGB 9.7 (L) 06/11/2020 1013   HCT 36.0 03/11/2021 0756   HCT 29.6 (L) 06/11/2020 1013   PLT 153 10/08/2020 0907   PLT 190 06/11/2020 1013   MCV 96.5 10/08/2020 0907   MCV 95 06/11/2020 1013   MCH 30.5 10/08/2020 0907   MCHC 31.6 10/08/2020 0907   RDW 15.0 10/08/2020 0907   RDW 13.4 06/11/2020 1013   LYMPHSABS 1.6 10/08/2020 0907   LYMPHSABS 1.5 06/11/2020 1013   MONOABS 0.5 10/08/2020 0907   EOSABS 0.2 10/08/2020 0907   EOSABS 0.2 06/11/2020 1013   BASOSABS 0.0 10/08/2020 0907   BASOSABS 0.1 06/11/2020 1013    ASSESSMENT AND PLAN:  1. Type 2 diabetes mellitus with chronic kidney disease on chronic dialysis, with  long-term current use of insulin (HCC) Level of control unknown as patient has not been checking blood sugars.  Encourage patient to let her son checked her blood sugar at least once a day before breakfast or dinner and bring in those readings when she sees her PCP next month.  Continue current dose of Lantus insulin.  Continue trying to eat healthy. - POCT glucose (manual entry)  2. Essential hypertension Not at goal.  Add low-dose amlodipine. - amLODipine (NORVASC) 2.5 MG tablet; Take 1 tablet (2.5 mg total) by mouth daily.  Dispense: 30 tablet; Refill: 4  3. Other diabetic neurological complication associated with type 2 diabetes mellitus (HCC) - gabapentin (NEURONTIN) 300 MG capsule; TAKE 1 CAPSULE (300 MG TOTAL) BY MOUTH AT BEDTIME.  Dispense: 30 capsule; Refill: 2    Patient was given the opportunity to ask questions.  Patient verbalized understanding of the plan and was able to repeat key elements of the plan.  [employer] Language interpreter used during this encounter. #[phone]  Orders Placed This Encounter  Procedures   POCT glucose (manual entry)     Requested Prescriptions   Signed Prescriptions Disp Refills   amLODipine (NORVASC) 2.5 MG tablet 30 tablet 4    Sig: Take 1 tablet (2.5 mg total) by mouth daily.   gabapentin (NEURONTIN) 300 MG capsule 30 capsule 2    Sig: TAKE 1 CAPSULE (300 MG TOTAL)  BY MOUTH AT BEDTIME.    No follow-ups on file.  Karle Plumber, MD, FACP

## 2021-06-13 ENCOUNTER — Other Ambulatory Visit: Payer: Self-pay

## 2021-06-14 ENCOUNTER — Other Ambulatory Visit: Payer: Self-pay

## 2021-06-14 MED ORDER — CINACALCET HCL 30 MG PO TABS
30.0000 mg | ORAL_TABLET | Freq: Every day | ORAL | 3 refills | Status: DC
  Filled 2021-06-14: qty 30, 30d supply, fill #0

## 2021-06-15 ENCOUNTER — Other Ambulatory Visit: Payer: Self-pay

## 2021-06-16 ENCOUNTER — Other Ambulatory Visit: Payer: Self-pay | Admitting: Radiology

## 2021-06-17 ENCOUNTER — Ambulatory Visit (HOSPITAL_COMMUNITY)
Admission: RE | Admit: 2021-06-17 | Discharge: 2021-06-17 | Disposition: A | Discharge status: Home / Self Care | Payer: Self-pay | Source: Ambulatory Visit | Attending: Nephrology | Admitting: Nephrology

## 2021-06-17 DIAGNOSIS — E785 Hyperlipidemia, unspecified: Secondary | ICD-10-CM | POA: Insufficient documentation

## 2021-06-17 DIAGNOSIS — T829XXA Unspecified complication of cardiac and vascular prosthetic device, implant and graft, initial encounter: Secondary | ICD-10-CM | POA: Insufficient documentation

## 2021-06-17 DIAGNOSIS — I120 Hypertensive chronic kidney disease with stage 5 chronic kidney disease or end stage renal disease: Secondary | ICD-10-CM | POA: Insufficient documentation

## 2021-06-17 DIAGNOSIS — Z95828 Presence of other vascular implants and grafts: Secondary | ICD-10-CM | POA: Insufficient documentation

## 2021-06-17 DIAGNOSIS — Z992 Dependence on renal dialysis: Secondary | ICD-10-CM | POA: Insufficient documentation

## 2021-06-17 DIAGNOSIS — E1122 Type 2 diabetes mellitus with diabetic chronic kidney disease: Secondary | ICD-10-CM | POA: Insufficient documentation

## 2021-06-17 DIAGNOSIS — N186 End stage renal disease: Secondary | ICD-10-CM | POA: Insufficient documentation

## 2021-06-17 DIAGNOSIS — Y832 Surgical operation with anastomosis, bypass or graft as the cause of abnormal reaction of the patient, or of later complication, without mention of misadventure at the time of the procedure: Secondary | ICD-10-CM | POA: Insufficient documentation

## 2021-06-17 HISTORY — PX: IR DIALY SHUNT INTRO NEEDLE/INTRACATH INITIAL W/IMG LEFT: IMG6102

## 2021-06-17 LAB — GLUCOSE, CAPILLARY
Glucose-Capillary: 133 mg/dL — ABNORMAL HIGH (ref 70–99)
Glucose-Capillary: 111 mg/dL — ABNORMAL HIGH (ref 70–99)

## 2021-06-17 MED ORDER — MIDAZOLAM HCL 2 MG/2ML IJ SOLN
INTRAMUSCULAR | Status: AC
  Filled 2021-06-17: qty 4

## 2021-06-17 MED ORDER — FENTANYL CITRATE (PF) 100 MCG/2ML IJ SOLN
INTRAMUSCULAR | Status: AC
  Filled 2021-06-17: qty 4

## 2021-06-17 MED ORDER — MIDAZOLAM HCL 2 MG/2ML IJ SOLN
INTRAMUSCULAR | Status: DC | PRN
  Administered 2021-06-17: 1 mg via INTRAVENOUS

## 2021-06-17 MED ORDER — SODIUM CHLORIDE 0.9 % IV SOLN: INTRAVENOUS | Status: DC

## 2021-06-17 MED ORDER — LIDOCAINE HCL (PF) 1 % IJ SOLN
INTRAMUSCULAR | Status: DC | PRN
  Administered 2021-06-17: 5 mL

## 2021-06-17 MED ORDER — IOHEXOL 300 MG/ML  SOLN
100.0000 mL | Freq: Once | INTRAMUSCULAR | Status: AC | PRN
  Administered 2021-06-17: 20 mL via INTRA_ARTERIAL

## 2021-06-17 MED ORDER — FENTANYL CITRATE (PF) 100 MCG/2ML IJ SOLN
INTRAMUSCULAR | Status: DC | PRN
  Administered 2021-06-17: 25 ug via INTRAVENOUS

## 2021-06-17 NOTE — H&P (Signed)
Chief Complaint: Low arterial flow and weak bruit. Request is for fistulogram with possible intervention. Left upper arm gore tex graft placed on 9.9.22  Referring Physician(s): Upton,Elizabeth  Supervising Physician: Ruthann Cancer  Patient Status: [facility] - Out-pt  History of Present Illness: Sarah Kelley is a 70 y.o. female Spanish speaking female inpatient. History of  HTN, HLD,  DM, ESRD on HD via a left upper arm gore tex graft placed on 9.9.22 . Team is requesting a fistulogram with possible intervention due to low arterial flow and weak bruit. Patient previously had a right arm brachiocephalic AVF and a RIJ tunneled HD catheter that IR placed on 6.29.22 and removed on 11.2.22.  Patient seen at beside with son and interpretor present. Currently without any significant complaints. Patient endorsing intervention pelvic pain with urination and dizziness this morning that self resolved. Patient alert and laying in bed, calm and comfortable. Denies any fevers, headache, chest pain, SOB, cough, abdominal pain, nausea, vomiting or bleeding. Return precautions and treatment recommendations and follow-up discussed with the patient and her son who are agreeable with the plan.    Past Medical History:  Diagnosis Date   Diabetes mellitus    ESRD (end stage renal disease) ([place])    dialysis T/Th/Sa   GERD (gastroesophageal reflux disease)    Hyperlipidemia    Hypertension    Iron deficiency anemia    MRSA infection    hx of   Neuropathy    Diabetic   Pneumonia 07/2016   Renal disorder    Seizure-like activity ([place])    Sepsis ([place])    Thyroid disease    Wears dentures     Past Surgical History:  Procedure Laterality Date   AMPUTATION Left 05/21/2015   Procedure: Left Foot 3rd Ray Amputation;  Surgeon: Newt Minion, MD;  Location: [place];  Service: Orthopedics;  Laterality: Left;   AV FISTULA PLACEMENT Left 03/28/2016   Procedure: ARTERIOVENOUS (AV) FISTULA CREATION LEFT  ARM;  Surgeon: Waynetta Sandy, MD;  Location: [place];  Service: Vascular;  Laterality: Left;   AV FISTULA PLACEMENT Right 06/04/2019   Procedure: ARTERIOVENOUS (AV) FISTULA CREATION RIGHT ARM;  Surgeon: Waynetta Sandy, MD;  Location: [place];  Service: Vascular;  Laterality: Right;   AV FISTULA PLACEMENT Left 03/11/2021   Procedure: INSERTION OF LEFT ARM ARTERIOVENOUS (AV) GORE-TEX GRAFT;  Surgeon: Waynetta Sandy, MD;  Location: [place];  Service: Vascular;  Laterality: Left;   DILATION AND CURETTAGE OF UTERUS     EYE SURGERY     FISTULA SUPERFICIALIZATION Left 09/04/2016   Procedure: FISTULA SUPERFICIALIZATION LEFT UPPER ARM;  Surgeon: Waynetta Sandy, MD;  Location: [place];  Service: Vascular;  Laterality: Left;   FISTULA SUPERFICIALIZATION Right 10/08/2019   Procedure: FISTULA SUPERFICIALIZATION OR RIGHT ARM;  Surgeon: Waynetta Sandy, MD;  Location: [place];  Service: Vascular;  Laterality: Right;   IR AV DIALY SHUNT INTRO NEEDLE/INTRAC INITIAL W/PTA/STENT/IMG LT Left 06/13/2017   IR AV DIALY SHUNT INTRO NEEDLE/INTRAC INITIAL W/PTA/STENT/IMG LT Left 09/12/2017   IR AV DIALY SHUNT INTRO NEEDLE/INTRACATH INITIAL W/PTA/IMG LEFT  11/10/2016   IR AV DIALY SHUNT INTRO NEEDLE/INTRACATH INITIAL W/PTA/IMG LEFT  03/09/2017   IR AV DIALY SHUNT INTRO NEEDLE/INTRACATH INITIAL W/PTA/IMG LEFT  12/12/2017   IR AV DIALY SHUNT INTRO NEEDLE/INTRACATH INITIAL W/PTA/IMG LEFT  02/27/2018   IR AV DIALY SHUNT INTRO NEEDLE/INTRACATH INITIAL W/PTA/IMG LEFT  06/05/2018   IR AV DIALY SHUNT INTRO NEEDLE/INTRACATH INITIAL W/PTA/IMG LEFT  09/11/2018  IR AV DIALY SHUNT INTRO NEEDLE/INTRACATH INITIAL W/PTA/IMG RIGHT Right 04/21/2020   IR AV DIALY SHUNT INTRO NEEDLE/INTRACATH INITIAL W/PTA/IMG RIGHT Right 10/08/2020   IR DIALY SHUNT INTRO NEEDLE/INTRACATH INITIAL W/IMG LEFT Left 05/08/2018   IR FLUORO GUIDE CV LINE RIGHT  02/01/2019   IR FLUORO GUIDE CV LINE RIGHT  07/14/2020   IR FLUORO GUIDE CV  LINE RIGHT  12/29/2020   IR GENERIC HISTORICAL  08/01/2016   IR FLUORO GUIDE CV LINE RIGHT 08/01/2016 [name], MD [facility]   IR GENERIC HISTORICAL  08/01/2016   IR US GUIDE VASC ACCESS RIGHT 08/01/2016 [name], MD [facility]   IR REMOVAL TUN CV CATH W/O FL  11/01/2016   IR REMOVAL TUN CV CATH W/O FL  01/02/2020   IR REMOVAL TUN CV CATH W/O FL  05/04/2021   IR THROMBECTOMY AV FISTULA W/THROMBOLYSIS INC/SHUNT/IMG LEFT Left 01/31/2019   IR THROMBECTOMY AV FISTULA W/THROMBOLYSIS/PTA INC/SHUNT/IMG LEFT Left 01/27/2019   IR THROMBECTOMY AV FISTULA W/THROMBOLYSIS/PTA INC/SHUNT/IMG LEFT Left 07/16/2020   IR THROMBECTOMY AV FISTULA W/THROMBOLYSIS/PTA/STENT INC/SHUNT/IMG RT Right 09/24/2020   IR US GUIDE VASC ACCESS LEFT  11/10/2016   IR US GUIDE VASC ACCESS LEFT  09/12/2017   IR US GUIDE VASC ACCESS LEFT  12/12/2017   IR US GUIDE VASC ACCESS LEFT  06/05/2018   IR US GUIDE VASC ACCESS LEFT  09/11/2018   IR US GUIDE VASC ACCESS LEFT  01/27/2019   IR US GUIDE VASC ACCESS LEFT  02/01/2019   IR US GUIDE VASC ACCESS RIGHT  02/01/2019   IR US GUIDE VASC ACCESS RIGHT  07/14/2020   IR US GUIDE VASC ACCESS RIGHT  07/16/2020   IR US GUIDE VASC ACCESS RIGHT  10/08/2020   IR US GUIDE VASC ACCESS RIGHT  12/29/2020   UPPER EXTREMITY VENOGRAPHY Bilateral 02/28/2021   Procedure: UPPER EXTREMITY VENOGRAPHY;  Surgeon: Waynetta Sandy, MD;  Location: [place] [facility];  Service: Cardiovascular;  Laterality: Bilateral;    Allergies: Patient has no known allergies.  Medications: Prior to Admission medications   Medication Sig Start Date End Date Taking? Authorizing Provider  amLODipine (NORVASC) 2.5 MG tablet Take 1 tablet (2.5 mg total) by mouth daily. 06/10/21  Yes Ladell Pier, MD  atorvastatin (LIPITOR) 40 MG tablet TAKE 1 TABLET (40 MG TOTAL) BY MOUTH DAILY. 02/14/21 02/14/22 Yes [name], MD  cetirizine (ZYRTEC) 10 MG tablet Take 1 tablet (10 mg total) by mouth daily. 06/10/20  Yes Mayers, Cari S,  PA-C  DULoxetine (CYMBALTA) 60 MG capsule TAKE 1 CAPSULE (60 MG TOTAL) BY MOUTH DAILY. Patient taking differently: Take 60 mg by mouth daily in the afternoon. 04/27/21 04/27/22 Yes Newlin, [name], MD  ethyl chloride spray SPRAY 1 SPRAY TO THE SKIN 3 TIMES A WEEK AS NEEDED. SPRAY A SMALL AMOUNT JUST PRIOR TO NEEDLE INSERTION. 05/19/21  Yes Newlin, Charlane Ferretti, MD  gabapentin (NEURONTIN) 300 MG capsule TAKE 1 CAPSULE (300 MG TOTAL) BY MOUTH AT BEDTIME. Patient taking differently: Take 300 mg by mouth daily in the afternoon. 06/10/21 06/10/22 Yes Ladell Pier, MD  insulin glargine (LANTUS) 100 UNIT/ML injection INJECT SUBCUTANEOUSLY TWICE DAILY 15 UNITS IN THE MORNING AND 10 IN THE EVENING Patient taking differently: Inject 7-18 Units into the skin See admin instructions. Inject 18 units subcutaneously in the morning & inject 7 units subcutaneously in the afternoon. 07/28/20 07/28/21 Yes [name], MD  Insulin Syringe-Needle U-100 (TRUEPLUS INSULIN SYRINGE) 31G X 5/16" 1 ML MISC USE AS DIRECTED 03/11/21  Yes Newlin, [name], MD  lisinopril (ZESTRIL) 20 MG tablet TAKE 1 TABLET (20 MG TOTAL) BY MOUTH DAILY. 03/28/21 03/28/22 Yes [name], MD  Nutritional Supplements (FEEDING SUPPLEMENT, NEPRO CARB STEADY,) LIQD Take 237 mLs by mouth See admin instructions. Take 1 can (237 ml) by mouth once daily on Tuesdays, Thursdays & Saturday morning. May take an additional can (237 ml) by mouth if needed for poor appetite.   Yes [provider]  sucroferric oxyhydroxide (VELPHORO) 500 MG chewable tablet Chew 500 mg by mouth 3 (three) times daily with meals.   Yes [provider]  Blood Glucose Monitoring Suppl (TRUE METRIX METER) DEVI 1 each by Does not apply route 3 (three) times daily before meals. 07/25/17   [name], MD  cinacalcet (SENSIPAR) 30 MG tablet Take 1 tablet by mouth once a day Patient taking differently: Take 30 mg by mouth daily in the afternoon. 06/14/21     glucose  blood (TRUE METRIX BLOOD GLUCOSE TEST) test strip Use 3 times daily before meals 11/08/20   [name], MD  methocarbamol (ROBAXIN) 500 MG tablet TAKE 1 TABLET (500 MG TOTAL) BY MOUTH 2 (TWO) TIMES DAILY AS NEEDED FOR MUSCLE SPASMS. Patient not taking: Reported on 06/14/2021 07/28/20 07/28/21  [name], MD  omeprazole (PRILOSEC) 20 MG capsule TAKE 1 CAPSULE (20 MG TOTAL) BY MOUTH DAILY. Patient not taking: Reported on 06/10/2021 01/05/21 01/05/22  Mayers, Cari S, PA-C  TRUEplus Lancets 28G MISC 1 each by Other route 3 (three) times daily. 02/14/21   [name], MD     No family history on file.  Social History   Socioeconomic History   Marital status: Widowed    Spouse name: Not on file   Number of children: 11   Years of education: no schooling   Highest education level: Not on file  Occupational History   Not on file  Tobacco Use   Smoking status: Never   Smokeless tobacco: Never  Vaping Use   Vaping Use: Never used  Substance and Sexual Activity   Alcohol use: Not Currently    Alcohol/week: 0.0 standard drinks    Comment: rare- a beer every now and then   Drug use: No   Sexual activity: Not Currently  Other Topics Concern   Not on file  Social History Narrative   Lives with son Marcial    Right handed   Caffeine: every once in a while a little coffee, chamomile tea, or soda   Social Determinants of Health   Financial Resource Strain: Not on file  Food Insecurity: Not on file  Transportation Needs: Not on file  Physical Activity: Not on file  Stress: Not on file  Social Connections: Not on file    Review of Systems: A 12 point ROS discussed and pertinent positives are indicated in the HPI above.  All other systems are negative.  Review of Systems  Constitutional:  Negative for fatigue and fever.  HENT:  Negative for congestion.   Respiratory:  Negative for cough and shortness of breath.   Gastrointestinal:  Negative for abdominal pain, diarrhea, nausea  and vomiting.  Genitourinary:  Positive for dysuria (intermittent).  Neurological:  Positive for dizziness (this am self resolved.).   Vital Signs: BP (!) 128/43    Pulse 72    Temp 99.6 F (37.6 C) (Oral)    Resp 12    Ht 5\' 3"  (1.6 m)    Wt 149 lb 14.6 oz (68 kg)  LMP  (LMP Unknown)    SpO2 100%    BMI 26.56 kg/m   Physical Exam Vitals and nursing note reviewed.  Constitutional:      Appearance: She is well-developed. She is obese.  HENT:     Head: Normocephalic and atraumatic.  Eyes:     Conjunctiva/sclera: Conjunctivae normal.  Cardiovascular:     Rate and Rhythm: Normal rate and regular rhythm.     Heart sounds: Normal heart sounds.     Comments: LUE gore tex graft +/+ Pulmonary:     Effort: Pulmonary effort is normal.  Musculoskeletal:        General: Normal range of motion.     Cervical back: Normal range of motion.  Skin:    General: Skin is warm.  Neurological:     Mental Status: She is alert and oriented to person, place, and time.    Imaging: No results found.  Labs:  CBC: Recent Labs    07/16/20 0300 07/17/20 0332 07/18/20 0705 10/08/20 0907 12/29/20 1102 02/28/21 1057 03/11/21 0756  WBC 6.3 5.3 4.3 4.5  --   --   --   HGB 10.8* 10.2* 9.3* 11.2* 8.5* 13.3 12.2  HCT 35.3* 33.3* 29.2* 35.4* 25.0* 39.0 36.0  PLT 175 145* 145* 153  --   --   --     COAGS: Recent Labs    07/16/20 0300 10/08/20 0907  INR 1.2 1.1    BMP: Recent Labs    07/17/20 0332 07/17/20 0818 07/18/20 0705 09/24/20 1138 10/08/20 0907 12/29/20 1102 02/28/21 1057 03/11/21 0756  NA 135  --  138 135 139 134* 138 136  K 3.3*   < > 4.0 6.5* 4.1 6.4* 4.9 5.0  CL 94*  --  98 99 99 101 99 103  CO2 27  --  28 28 33*  --   --   --   GLUCOSE 116*  --  146* 57* 75 64* 72 151*  BUN 20  --  26* 78* 25* >130* 50* 47*  CALCIUM 8.5*  --  8.5* 9.6 9.5  --   --   --   CREATININE 4.56*  --  7.10* 9.60* 5.54* 16.70* 7.20* 6.10*  GFRNONAA 10*  --  6* 4* 8*  --   --   --    < > =  values in this interval not displayed.    LIVER FUNCTION TESTS: Recent Labs    07/14/20 2122 07/15/20 0427  ALBUMIN 2.7* 2.7*     Assessment and Plan:  70 y.o. Spanish speaking female inpatient. History of  HTN, HLD,  DM, ESRD on HD via a left upper arm gore tex graft placed on 9.9.22 . Team is requesting a fistulogram with possible intervention due to low arterial flow and weak bruit.   Patient previously had a right arm brachiocephalic AVF and a RIJ tunneled HD catheter that IR placed on 6.29.22 and removed on 11.2.22. No pertinent imaging. Labs pending.  and medications are within acceptable parameters. NKDA. Patient has been NPO since midnight.   Patient was seen at bedside with son and interpretor present. Risks and benefits discussed with the patient  and her son including, but not limited to bleeding, infection, vascular injury, pulmonary embolism, need for tunneled HD catheter placement or even death.  All of the patient's questions were answered, patient is agreeable to proceed. Consent signed and in chart.   Thank you for this interesting consult.  I greatly enjoyed meeting Sarah  Kelley and look forward to participating in their care.  A copy of this report was sent to the requesting provider on this date.  Electronically Signed: Jacqualine Mau, NP 06/17/2021, 7:59 AM   I spent a total of  30 Minutes   in face to face in clinical consultation, greater than 50% of which was counseling/coordinating care for fistulogram possible intervention.

## 2021-06-17 NOTE — Procedures (Signed)
Pre-procedure Diagnosis: ESRD Post-procedure Diagnosis: Same  Normally functioning left upper arm AV graft without peripheral or central stenosis.  No intervention performed.  No immediate post procedural complication.   Complications: None Immediate EBL: Trace  Ronny Bacon, MD Pager #: [phone]

## 2021-06-28 ENCOUNTER — Other Ambulatory Visit: Payer: Self-pay

## 2021-07-04 ENCOUNTER — Other Ambulatory Visit: Payer: Self-pay

## 2021-07-04 ENCOUNTER — Other Ambulatory Visit: Payer: Self-pay | Admitting: Family Medicine

## 2021-07-04 MED ORDER — ETHYL CHLORIDE EX AERO
INHALATION_SPRAY | CUTANEOUS | 3 refills | Status: DC
  Filled 2021-07-04 – 2021-07-08 (×2): qty 116, 30d supply, fill #0
  Filled 2021-08-23 – 2021-08-29 (×2): qty 116, 30d supply, fill #1
  Filled 2021-08-30: qty 116, 30d supply, fill #0
  Filled 2021-09-29: qty 116, 30d supply, fill #1
  Filled 2021-12-14: qty 116, 30d supply, fill #2

## 2021-07-04 MED FILL — Insulin Glargine Inj 100 Unit/ML: SUBCUTANEOUS | 28 days supply | Qty: 10 | Fill #6 | Status: AC

## 2021-07-05 ENCOUNTER — Other Ambulatory Visit: Payer: Self-pay

## 2021-07-08 ENCOUNTER — Other Ambulatory Visit (HOSPITAL_COMMUNITY): Payer: Self-pay

## 2021-07-16 ENCOUNTER — Encounter (HOSPITAL_COMMUNITY): Payer: Self-pay

## 2021-07-16 ENCOUNTER — Emergency Department (HOSPITAL_COMMUNITY)
Admission: EM | Admit: 2021-07-16 | Discharge: 2021-07-16 | Disposition: A | Discharge status: Home / Self Care | Payer: Self-pay | Attending: Student | Admitting: Student

## 2021-07-16 DIAGNOSIS — Z794 Long term (current) use of insulin: Secondary | ICD-10-CM

## 2021-07-16 DIAGNOSIS — T8249XA Other complication of vascular dialysis catheter, initial encounter: Secondary | ICD-10-CM

## 2021-07-16 DIAGNOSIS — Z992 Dependence on renal dialysis: Secondary | ICD-10-CM | POA: Insufficient documentation

## 2021-07-16 DIAGNOSIS — E1151 Type 2 diabetes mellitus with diabetic peripheral angiopathy without gangrene: Secondary | ICD-10-CM

## 2021-07-16 DIAGNOSIS — T801XXA Vascular complications following infusion, transfusion and therapeutic injection, initial encounter: Secondary | ICD-10-CM | POA: Insufficient documentation

## 2021-07-16 DIAGNOSIS — I120 Hypertensive chronic kidney disease with stage 5 chronic kidney disease or end stage renal disease: Secondary | ICD-10-CM | POA: Insufficient documentation

## 2021-07-16 DIAGNOSIS — N186 End stage renal disease: Secondary | ICD-10-CM

## 2021-07-16 DIAGNOSIS — E1122 Type 2 diabetes mellitus with diabetic chronic kidney disease: Secondary | ICD-10-CM | POA: Insufficient documentation

## 2021-07-16 DIAGNOSIS — Z79899 Other long term (current) drug therapy: Secondary | ICD-10-CM | POA: Insufficient documentation

## 2021-07-16 DIAGNOSIS — Z7984 Long term (current) use of oral hypoglycemic drugs: Secondary | ICD-10-CM | POA: Insufficient documentation

## 2021-07-16 LAB — CBC WITH DIFFERENTIAL/PLATELET
Abs Immature Granulocytes: 0.08 10*3/uL — ABNORMAL HIGH (ref 0.00–0.07)
Basophils Absolute: 0.1 10*3/uL (ref 0.0–0.1)
Basophils Relative: 1 %
Eosinophils Absolute: 0.1 10*3/uL (ref 0.0–0.5)
Eosinophils Relative: 1 %
HCT: 37.5 % (ref 36.0–46.0)
Hemoglobin: 12.3 g/dL (ref 12.0–15.0)
Immature Granulocytes: 1 %
Lymphocytes Relative: 20 %
Lymphs Abs: 1.4 10*3/uL (ref 0.7–4.0)
MCH: 31.9 pg (ref 26.0–34.0)
MCHC: 32.8 g/dL (ref 30.0–36.0)
MCV: 97.2 fL (ref 80.0–100.0)
Monocytes Absolute: 0.6 10*3/uL (ref 0.1–1.0)
Monocytes Relative: 8 %
Neutro Abs: 5 10*3/uL (ref 1.7–7.7)
Neutrophils Relative %: 69 %
Platelets: UNDETERMINED 10*3/uL (ref 150–400)
RBC: 3.86 MIL/uL — ABNORMAL LOW (ref 3.87–5.11)
RDW: 13.3 % (ref 11.5–15.5)
WBC: 7.3 10*3/uL (ref 4.0–10.5)
nRBC: 0 % (ref 0.0–0.2)

## 2021-07-16 LAB — COMPREHENSIVE METABOLIC PANEL
ALT: 20 U/L (ref 0–44)
AST: 25 U/L (ref 15–41)
Albumin: 2.8 g/dL — ABNORMAL LOW (ref 3.5–5.0)
Alkaline Phosphatase: 187 U/L — ABNORMAL HIGH (ref 38–126)
Anion gap: 8 (ref 5–15)
BUN: 10 mg/dL (ref 8–23)
CO2: 27 mmol/L (ref 22–32)
Calcium: 8.7 mg/dL — ABNORMAL LOW (ref 8.9–10.3)
Chloride: 97 mmol/L — ABNORMAL LOW (ref 98–111)
Creatinine, Ser: 2.86 mg/dL — ABNORMAL HIGH (ref 0.44–1.00)
GFR, Estimated: 17 mL/min — ABNORMAL LOW (ref >60)
Glucose, Bld: 158 mg/dL — ABNORMAL HIGH (ref 70–99)
Potassium: 3 mmol/L — ABNORMAL LOW (ref 3.5–5.1)
Sodium: 132 mmol/L — ABNORMAL LOW (ref 135–145)
Total Bilirubin: 0.5 mg/dL (ref 0.3–1.2)
Total Protein: 6.1 g/dL — ABNORMAL LOW (ref 6.5–8.1)

## 2021-07-16 MED ORDER — POTASSIUM CHLORIDE CRYS ER 20 MEQ PO TBCR: 40.0000 meq | EXTENDED_RELEASE_TABLET | Freq: Once | ORAL | Status: DC

## 2021-07-16 MED ORDER — LACTATED RINGERS IV BOLUS: 1000.0000 mL | Freq: Once | INTRAVENOUS | Status: DC

## 2021-07-16 NOTE — H&P (View-Only) (Signed)
[facility] [place]  ASSESSMENT / PLAN: 71 y.o. female with ESRD dialyzing Tuesday, Thursday, and Saturday via a left upper extremity arteriovenous graft created 03/11/2021.  She had unplanned decannulation during dialysis, and lost 500 mL of blood per witnesses.  The left arm is ecchymotic, with some swelling in the axilla.  There is a easily palpable pulsatile thrill in the graft.  There is enough space to cannulate the graft.  Recommend gentle compression over the rest of the weekend until her next dialysis session on Tuesday.  She can try to do dialysis on Tuesday.  If she is unsuccessful, she will need a tunneled dialysis catheter placed.  I will arrange for a fistulogram for her next Friday to interrogate the pulsatility of the graft.  As long as her hemoglobin is stable, I think it is safe for her to discharge from my perspective.  CHIEF COMPLAINT: Accidental removal of dialysis cannula  HISTORY OF PRESENT ILLNESS: Sarah Kelley is a 71 y.o. female with end-stage renal disease dialyzing Tuesday, Thursday, and Saturday via left upper extremity arteriovenous graft created by Dr. Donzetta Matters on 03/11/2021.  The patient was at her dialysis appointment today, when she became confused and pulled out her dialysis cannulas.  This created a large volume of blood loss.  EMS was called.  She was brought to [facility].  Her son is at bedside and acts as a Optometrist.  The patient does not speak much [place].  Past Medical History:  Diagnosis Date   Diabetes mellitus    ESRD (end stage renal disease) ([place])    dialysis T/Th/Sa   GERD (gastroesophageal reflux disease)    Hyperlipidemia    Hypertension    Iron deficiency anemia    MRSA infection    hx of   Neuropathy    Diabetic   Pneumonia 07/2016   Renal disorder    Seizure-like activity ([place])    Sepsis ([place])    Thyroid disease    Wears dentures     Past Surgical History:  Procedure Laterality Date    AMPUTATION Left 05/21/2015   Procedure: Left Foot 3rd Ray Amputation;  Surgeon: Newt Minion, MD;  Location: [place];  Service: Orthopedics;  Laterality: Left;   AV FISTULA PLACEMENT Left 03/28/2016   Procedure: ARTERIOVENOUS (AV) FISTULA CREATION LEFT ARM;  Surgeon: Waynetta Sandy, MD;  Location: [place];  Service: Vascular;  Laterality: Left;   AV FISTULA PLACEMENT Right 06/04/2019   Procedure: ARTERIOVENOUS (AV) FISTULA CREATION RIGHT ARM;  Surgeon: Waynetta Sandy, MD;  Location: [place];  Service: Vascular;  Laterality: Right;   AV FISTULA PLACEMENT Left 03/11/2021   Procedure: INSERTION OF LEFT ARM ARTERIOVENOUS (AV) GORE-TEX GRAFT;  Surgeon: Waynetta Sandy, MD;  Location: [place];  Service: Vascular;  Laterality: Left;   DILATION AND CURETTAGE OF UTERUS     EYE SURGERY     FISTULA SUPERFICIALIZATION Left 09/04/2016   Procedure: FISTULA SUPERFICIALIZATION LEFT UPPER ARM;  Surgeon: Waynetta Sandy, MD;  Location: [place];  Service: Vascular;  Laterality: Left;   FISTULA SUPERFICIALIZATION Right 10/08/2019   Procedure: FISTULA SUPERFICIALIZATION OR RIGHT ARM;  Surgeon: Waynetta Sandy, MD;  Location: [place];  Service: Vascular;  Laterality: Right;   IR AV DIALY SHUNT INTRO NEEDLE/INTRAC INITIAL W/PTA/STENT/IMG LT Left 06/13/2017   IR AV DIALY SHUNT INTRO NEEDLE/INTRAC INITIAL W/PTA/STENT/IMG LT Left 09/12/2017   IR AV DIALY SHUNT INTRO NEEDLE/INTRACATH INITIAL W/PTA/IMG LEFT  11/10/2016   IR  AV DIALY SHUNT INTRO NEEDLE/INTRACATH INITIAL W/PTA/IMG LEFT  03/09/2017   IR AV DIALY SHUNT INTRO NEEDLE/INTRACATH INITIAL W/PTA/IMG LEFT  12/12/2017   IR AV DIALY SHUNT INTRO NEEDLE/INTRACATH INITIAL W/PTA/IMG LEFT  02/27/2018   IR AV DIALY SHUNT INTRO NEEDLE/INTRACATH INITIAL W/PTA/IMG LEFT  06/05/2018   IR AV DIALY SHUNT INTRO NEEDLE/INTRACATH INITIAL W/PTA/IMG LEFT  09/11/2018   IR AV DIALY SHUNT INTRO NEEDLE/INTRACATH INITIAL W/PTA/IMG RIGHT Right 04/21/2020   IR AV DIALY  SHUNT INTRO NEEDLE/INTRACATH INITIAL W/PTA/IMG RIGHT Right 10/08/2020   IR DIALY SHUNT INTRO NEEDLE/INTRACATH INITIAL W/IMG LEFT Left 05/08/2018   IR DIALY SHUNT INTRO NEEDLE/INTRACATH INITIAL W/IMG LEFT Left 06/17/2021   IR FLUORO GUIDE CV LINE RIGHT  02/01/2019   IR FLUORO GUIDE CV LINE RIGHT  07/14/2020   IR FLUORO GUIDE CV LINE RIGHT  12/29/2020   IR GENERIC HISTORICAL  08/01/2016   IR FLUORO GUIDE CV LINE RIGHT 08/01/2016 [name], MD [facility]   IR GENERIC HISTORICAL  08/01/2016   IR US GUIDE VASC ACCESS RIGHT 08/01/2016 [name], MD [facility]   IR REMOVAL TUN CV CATH W/O FL  11/01/2016   IR REMOVAL TUN CV CATH W/O FL  01/02/2020   IR REMOVAL TUN CV CATH W/O FL  05/04/2021   IR THROMBECTOMY AV FISTULA W/THROMBOLYSIS INC/SHUNT/IMG LEFT Left 01/31/2019   IR THROMBECTOMY AV FISTULA W/THROMBOLYSIS/PTA INC/SHUNT/IMG LEFT Left 01/27/2019   IR THROMBECTOMY AV FISTULA W/THROMBOLYSIS/PTA INC/SHUNT/IMG LEFT Left 07/16/2020   IR THROMBECTOMY AV FISTULA W/THROMBOLYSIS/PTA/STENT INC/SHUNT/IMG RT Right 09/24/2020   IR US GUIDE VASC ACCESS LEFT  11/10/2016   IR US GUIDE VASC ACCESS LEFT  09/12/2017   IR US GUIDE VASC ACCESS LEFT  12/12/2017   IR US GUIDE VASC ACCESS LEFT  06/05/2018   IR US GUIDE VASC ACCESS LEFT  09/11/2018   IR US GUIDE VASC ACCESS LEFT  01/27/2019   IR US GUIDE VASC ACCESS LEFT  02/01/2019   IR US GUIDE VASC ACCESS RIGHT  02/01/2019   IR US GUIDE VASC ACCESS RIGHT  07/14/2020   IR US GUIDE VASC ACCESS RIGHT  07/16/2020   IR US GUIDE VASC ACCESS RIGHT  10/08/2020   IR US GUIDE VASC ACCESS RIGHT  12/29/2020   UPPER EXTREMITY VENOGRAPHY Bilateral 02/28/2021   Procedure: UPPER EXTREMITY VENOGRAPHY;  Surgeon: Waynetta Sandy, MD;  Location: [facility];  Service: Cardiovascular;  Laterality: Bilateral;    No family history on file.  Social History   Socioeconomic History   Marital status: Widowed    Spouse name: Not on file   Number of children: 11   Years of education: no  schooling   Highest education level: Not on file  Occupational History   Not on file  Tobacco Use   Smoking status: Never   Smokeless tobacco: Never  Vaping Use   Vaping Use: Never used  Substance and Sexual Activity   Alcohol use: Not Currently    Alcohol/week: 0.0 standard drinks    Comment: rare- a beer every now and then   Drug use: No   Sexual activity: Not Currently  Other Topics Concern   Not on file  Social History Narrative   Lives with son Marcial    Right handed   Caffeine: every once in a while a little coffee, chamomile tea, or soda   Social Determinants of Health   Financial Resource Strain: Not on file  Food Insecurity: Not on file  Transportation Needs: Not on file  Physical  Activity: Not on file  Stress: Not on file  Social Connections: Not on file  Intimate Partner Violence: Not on file    No Known Allergies  Current Facility-Administered Medications  Medication Dose Route Frequency Provider Last Rate Last Admin   lactated ringers bolus 1,000 mL  1,000 mL Intravenous Once Kommor, Madison, MD       Current Outpatient Medications  Medication Sig Dispense Refill   amLODipine (NORVASC) 2.5 MG tablet Take 1 tablet (2.5 mg total) by mouth daily. 30 tablet 4   atorvastatin (LIPITOR) 40 MG tablet TAKE 1 TABLET (40 MG TOTAL) BY MOUTH DAILY. 30 tablet 6   Blood Glucose Monitoring Suppl (TRUE METRIX METER) DEVI 1 each by Does not apply route 3 (three) times daily before meals. 1 Device 0   cetirizine (ZYRTEC) 10 MG tablet Take 1 tablet (10 mg total) by mouth daily. 30 tablet 1   cinacalcet (SENSIPAR) 30 MG tablet Take 1 tablet by mouth once a day (Patient taking differently: Take 30 mg by mouth daily in the afternoon.) 90 tablet 3   DULoxetine (CYMBALTA) 60 MG capsule TAKE 1 CAPSULE (60 MG TOTAL) BY MOUTH DAILY. (Patient taking differently: Take 60 mg by mouth daily in the afternoon.) 30 capsule 2   ethyl chloride spray SPRAY 1 SPRAY TO THE SKIN 3 TIMES A WEEK AS  NEEDED. SPRAY A SMALL AMOUNT JUST PRIOR TO NEEDLE INSERTION. 116 mL 3   gabapentin (NEURONTIN) 300 MG capsule TAKE 1 CAPSULE (300 MG TOTAL) BY MOUTH AT BEDTIME. (Patient taking differently: Take 300 mg by mouth daily in the afternoon.) 30 capsule 2   glucose blood (TRUE METRIX BLOOD GLUCOSE TEST) test strip Use 3 times daily before meals 100 each 3   insulin glargine (LANTUS) 100 UNIT/ML injection INJECT SUBCUTANEOUSLY TWICE DAILY 15 UNITS IN THE MORNING AND 10 IN THE EVENING (Patient taking differently: Inject 7-18 Units into the skin See admin instructions. Inject 18 units subcutaneously in the morning & inject 7 units subcutaneously in the afternoon.) 30 mL 6   Insulin Syringe-Needle U-100 (TRUEPLUS INSULIN SYRINGE) 31G X 5/16" 1 ML MISC USE AS DIRECTED 100 each 5   lisinopril (ZESTRIL) 20 MG tablet TAKE 1 TABLET (20 MG TOTAL) BY MOUTH DAILY. 30 tablet 5   methocarbamol (ROBAXIN) 500 MG tablet TAKE 1 TABLET (500 MG TOTAL) BY MOUTH 2 (TWO) TIMES DAILY AS NEEDED FOR MUSCLE SPASMS. (Patient not taking: Reported on 06/14/2021) 30 tablet 2   Nutritional Supplements (FEEDING SUPPLEMENT, NEPRO CARB STEADY,) LIQD Take 237 mLs by mouth See admin instructions. Take 1 can (237 ml) by mouth once daily on Tuesdays, Thursdays & Saturday morning. May take an additional can (237 ml) by mouth if needed for poor appetite.     omeprazole (PRILOSEC) 20 MG capsule TAKE 1 CAPSULE (20 MG TOTAL) BY MOUTH DAILY. (Patient not taking: Reported on 06/10/2021) 30 capsule 3   sucroferric oxyhydroxide (VELPHORO) 500 MG chewable tablet Chew 500 mg by mouth 3 (three) times daily with meals.     TRUEplus Lancets 28G MISC 1 each by Other route 3 (three) times daily. 100 each 11   Facility-Administered Medications Ordered in Other Encounters  Medication Dose Route Frequency Provider Last Rate Last Admin   0.9 %  sodium chloride infusion   Intravenous Continuous Monia Sabal, PA-C        REVIEW OF SYSTEMS:  [X]  denotes positive  finding, [ ]  denotes negative finding Cardiac  Comments:  Chest pain or chest pressure:  Shortness of breath upon exertion:    Short of breath when lying flat:    Irregular heart rhythm:        Vascular    Pain in calf, thigh, or hip brought on by ambulation:    Pain in feet at night that wakes you up from your sleep:     Blood clot in your veins:    Leg swelling:         Pulmonary    Oxygen at home:    Productive cough:     Wheezing:         Neurologic    Sudden weakness in arms or legs:     Sudden numbness in arms or legs:     Sudden onset of difficulty speaking or slurred speech:    Temporary loss of vision in one eye:     Problems with dizziness:         Gastrointestinal    Blood in stool:     Vomited blood:         Genitourinary    Burning when urinating:     Blood in urine:        Psychiatric    Major depression:         Hematologic    Bleeding problems:    Problems with blood clotting too easily:        Skin    Rashes or ulcers:        Constitutional    Fever or chills:      PHYSICAL EXAM See nursing documentation for vital signs. On my exam, the patient's heart rate was in the 80s.  Blood pressure 105/53.  Oxygen saturation greater 95% on no supplemental oxygen.  Constitutional: Chronically ill appearing.  No distress. Appears well nourished.  Neurologic: CN intact. no focal findings. no sensory loss. Psychiatric:  Mood and affect symmetric and appropriate. Eyes:  No icterus. No conjunctival pallor. Ears, nose, throat:  mucous membranes moist. Midline trachea.  Cardiac: regular rate and rhythm.  Respiratory:  unlabored. Abdominal: not distended Peripheral vascular: left arm AVG pulsatile thrill. Mild surrounding ecchymosis. Edema in the axilla. Graft has segment >8cm that is easily palpable under the skin. Extremity: no cyanosis. no pallor.  Skin: no gangrene. no ulceration.  Lymphatic: no Stemmer's sign. no palpable  lymphadenopathy.  PERTINENT LABORATORY AND RADIOLOGIC DATA  Most recent CBC CBC Latest Ref Rng & Units 03/11/2021 02/28/2021 12/29/2020  WBC 4.0 - 10.5 K/uL - - -  Hemoglobin 12.0 - 15.0 g/dL 12.2 13.3 8.5(L)  Hematocrit 36.0 - 46.0 % 36.0 39.0 25.0(L)  Platelets 150 - 400 K/uL - - -     Most recent CMP CMP Latest Ref Rng & Units 03/11/2021 02/28/2021 12/29/2020  Glucose 70 - 99 mg/dL 151(H) 72 64(L)  BUN 8 - 23 mg/dL 47(H) 50(H) >130(H)  Creatinine 0.44 - 1.00 mg/dL 6.10(H) 7.20(H) 16.70(H)  Sodium 135 - 145 mmol/L 136 138 134(L)  Potassium 3.5 - 5.1 mmol/L 5.0 4.9 6.4(HH)  Chloride 98 - 111 mmol/L 103 99 101  CO2 22 - 32 mmol/L - - -  Calcium 8.9 - 10.3 mg/dL - - -  Total Protein 6.0 - 8.5 g/dL - - -  Total Bilirubin 0.0 - 1.2 mg/dL - - -  Alkaline Phos 44 - 121 IU/L - - -  AST 0 - 40 IU/L - - -  ALT 0 - 32 IU/L - - -    Renal function CrCl cannot be calculated (Patient's  most recent lab result is older than the maximum 21 days allowed.).  HbA1c, POC (controlled diabetic range) (%)  Date Value  02/14/2021 8.3 (A)    LDL Chol Calc (NIH)  Date Value Ref Range Status  02/14/2021 124 (H) 0 - 99 mg/dL Final    Yevonne Aline. Stanford Breed, MD [facility] Phone Number: [phone] 07/16/2021 1:44 PM  Total time spent on preparing this encounter including chart review, data review, collecting history, examining the patient, coordinating care for this established patient, 40 minutes.  Portions of this report may have been transcribed using voice recognition software.  Every effort has been made to ensure accuracy; however, inadvertent computerized transcription errors may still be present.

## 2021-07-16 NOTE — ED Triage Notes (Signed)
Pt here via EMS d/t pt pulling at fissure access while receiving access . Reports are loss of approx 537mL blood loss. Pt confused at baseline. Bleeding controlled. Dr. Matilde Sprang at bedside during triage to assess and remove clamp. No bleeding to note.

## 2021-07-16 NOTE — Consult Note (Signed)
[facility] [place]  ASSESSMENT / PLAN: 71 y.o. female with ESRD dialyzing Tuesday, Thursday, and Saturday via a left upper extremity arteriovenous graft created 03/11/2021.  She had unplanned decannulation during dialysis, and lost 500 mL of blood per witnesses.  The left arm is ecchymotic, with some swelling in the axilla.  There is a easily palpable pulsatile thrill in the graft.  There is enough space to cannulate the graft.  Recommend gentle compression over the rest of the weekend until her next dialysis session on Tuesday.  She can try to do dialysis on Tuesday.  If she is unsuccessful, she will need a tunneled dialysis catheter placed.  I will arrange for a fistulogram for her next Friday to interrogate the pulsatility of the graft.  As long as her hemoglobin is stable, I think it is safe for her to discharge from my perspective.  CHIEF COMPLAINT: Accidental removal of dialysis cannula  HISTORY OF PRESENT ILLNESS: Sarah Kelley is a 71 y.o. female with end-stage renal disease dialyzing Tuesday, Thursday, and Saturday via left upper extremity arteriovenous graft created by Dr. Donzetta Matters on 03/11/2021.  The patient was at her dialysis appointment today, when she became confused and pulled out her dialysis cannulas.  This created a large volume of blood loss.  EMS was called.  She was brought to [facility].  Her son is at bedside and acts as a Optometrist.  The patient does not speak much [place].  Past Medical History:  Diagnosis Date   Diabetes mellitus    ESRD (end stage renal disease) ([place])    dialysis T/Th/Sa   GERD (gastroesophageal reflux disease)    Hyperlipidemia    Hypertension    Iron deficiency anemia    MRSA infection    hx of   Neuropathy    Diabetic   Pneumonia 07/2016   Renal disorder    Seizure-like activity ([place])    Sepsis ([place])    Thyroid disease    Wears dentures     Past Surgical History:  Procedure Laterality Date    AMPUTATION Left 05/21/2015   Procedure: Left Foot 3rd Ray Amputation;  Surgeon: Newt Minion, MD;  Location: [place];  Service: Orthopedics;  Laterality: Left;   AV FISTULA PLACEMENT Left 03/28/2016   Procedure: ARTERIOVENOUS (AV) FISTULA CREATION LEFT ARM;  Surgeon: Waynetta Sandy, MD;  Location: [place];  Service: Vascular;  Laterality: Left;   AV FISTULA PLACEMENT Right 06/04/2019   Procedure: ARTERIOVENOUS (AV) FISTULA CREATION RIGHT ARM;  Surgeon: Waynetta Sandy, MD;  Location: [facility];  Service: Vascular;  Laterality: Right;   AV FISTULA PLACEMENT Left 03/11/2021   Procedure: INSERTION OF LEFT ARM ARTERIOVENOUS (AV) GORE-TEX GRAFT;  Surgeon: Waynetta Sandy, MD;  Location: [facility];  Service: Vascular;  Laterality: Left;   DILATION AND CURETTAGE OF UTERUS     EYE SURGERY     FISTULA SUPERFICIALIZATION Left 09/04/2016   Procedure: FISTULA SUPERFICIALIZATION LEFT UPPER ARM;  Surgeon: Waynetta Sandy, MD;  Location: [facility];  Service: Vascular;  Laterality: Left;   FISTULA SUPERFICIALIZATION Right 10/08/2019   Procedure: FISTULA SUPERFICIALIZATION OR RIGHT ARM;  Surgeon: Waynetta Sandy, MD;  Location: [facility];  Service: Vascular;  Laterality: Right;   IR AV DIALY SHUNT INTRO NEEDLE/INTRAC INITIAL W/PTA/STENT/IMG LT Left 06/13/2017   IR AV DIALY SHUNT INTRO NEEDLE/INTRAC INITIAL W/PTA/STENT/IMG LT Left 09/12/2017   IR AV DIALY SHUNT INTRO NEEDLE/INTRACATH INITIAL W/PTA/IMG LEFT  11/10/2016   IR  AV DIALY SHUNT INTRO NEEDLE/INTRACATH INITIAL W/PTA/IMG LEFT  03/09/2017   IR AV DIALY SHUNT INTRO NEEDLE/INTRACATH INITIAL W/PTA/IMG LEFT  12/12/2017   IR AV DIALY SHUNT INTRO NEEDLE/INTRACATH INITIAL W/PTA/IMG LEFT  02/27/2018   IR AV DIALY SHUNT INTRO NEEDLE/INTRACATH INITIAL W/PTA/IMG LEFT  06/05/2018   IR AV DIALY SHUNT INTRO NEEDLE/INTRACATH INITIAL W/PTA/IMG LEFT  09/11/2018   IR AV DIALY SHUNT INTRO NEEDLE/INTRACATH INITIAL W/PTA/IMG RIGHT Right 04/21/2020   IR AV DIALY  SHUNT INTRO NEEDLE/INTRACATH INITIAL W/PTA/IMG RIGHT Right 10/08/2020   IR DIALY SHUNT INTRO NEEDLE/INTRACATH INITIAL W/IMG LEFT Left 05/08/2018   IR DIALY SHUNT INTRO NEEDLE/INTRACATH INITIAL W/IMG LEFT Left 06/17/2021   IR FLUORO GUIDE CV LINE RIGHT  02/01/2019   IR FLUORO GUIDE CV LINE RIGHT  07/14/2020   IR FLUORO GUIDE CV LINE RIGHT  12/29/2020   IR GENERIC HISTORICAL  08/01/2016   IR FLUORO GUIDE CV LINE RIGHT 08/01/2016 [name], MD [facility]   IR GENERIC HISTORICAL  08/01/2016   IR US GUIDE VASC ACCESS RIGHT 08/01/2016 [name], MD [facility]   IR REMOVAL TUN CV CATH W/O FL  11/01/2016   IR REMOVAL TUN CV CATH W/O FL  01/02/2020   IR REMOVAL TUN CV CATH W/O FL  05/04/2021   IR THROMBECTOMY AV FISTULA W/THROMBOLYSIS INC/SHUNT/IMG LEFT Left 01/31/2019   IR THROMBECTOMY AV FISTULA W/THROMBOLYSIS/PTA INC/SHUNT/IMG LEFT Left 01/27/2019   IR THROMBECTOMY AV FISTULA W/THROMBOLYSIS/PTA INC/SHUNT/IMG LEFT Left 07/16/2020   IR THROMBECTOMY AV FISTULA W/THROMBOLYSIS/PTA/STENT INC/SHUNT/IMG RT Right 09/24/2020   IR US GUIDE VASC ACCESS LEFT  11/10/2016   IR US GUIDE VASC ACCESS LEFT  09/12/2017   IR US GUIDE VASC ACCESS LEFT  12/12/2017   IR US GUIDE VASC ACCESS LEFT  06/05/2018   IR US GUIDE VASC ACCESS LEFT  09/11/2018   IR US GUIDE VASC ACCESS LEFT  01/27/2019   IR US GUIDE VASC ACCESS LEFT  02/01/2019   IR US GUIDE VASC ACCESS RIGHT  02/01/2019   IR US GUIDE VASC ACCESS RIGHT  07/14/2020   IR US GUIDE VASC ACCESS RIGHT  07/16/2020   IR US GUIDE VASC ACCESS RIGHT  10/08/2020   IR US GUIDE VASC ACCESS RIGHT  12/29/2020   UPPER EXTREMITY VENOGRAPHY Bilateral 02/28/2021   Procedure: UPPER EXTREMITY VENOGRAPHY;  Surgeon: Waynetta Sandy, MD;  Location: [facility];  Service: Cardiovascular;  Laterality: Bilateral;    No family history on file.  Social History   Socioeconomic History   Marital status: Widowed    Spouse name: Not on file   Number of children: 11   Years of education: no  schooling   Highest education level: Not on file  Occupational History   Not on file  Tobacco Use   Smoking status: Never   Smokeless tobacco: Never  Vaping Use   Vaping Use: Never used  Substance and Sexual Activity   Alcohol use: Not Currently    Alcohol/week: 0.0 standard drinks    Comment: rare- a beer every now and then   Drug use: No   Sexual activity: Not Currently  Other Topics Concern   Not on file  Social History Narrative   Lives with son Marcial    Right handed   Caffeine: every once in a while a little coffee, chamomile tea, or soda   Social Determinants of Health   Financial Resource Strain: Not on file  Food Insecurity: Not on file  Transportation Needs: Not on file  Physical  Activity: Not on file  Stress: Not on file  Social Connections: Not on file  Intimate Partner Violence: Not on file    No Known Allergies  Current Facility-Administered Medications  Medication Dose Route Frequency Provider Last Rate Last Admin   lactated ringers bolus 1,000 mL  1,000 mL Intravenous Once Kommor, Madison, MD       Current Outpatient Medications  Medication Sig Dispense Refill   amLODipine (NORVASC) 2.5 MG tablet Take 1 tablet (2.5 mg total) by mouth daily. 30 tablet 4   atorvastatin (LIPITOR) 40 MG tablet TAKE 1 TABLET (40 MG TOTAL) BY MOUTH DAILY. 30 tablet 6   Blood Glucose Monitoring Suppl (TRUE METRIX METER) DEVI 1 each by Does not apply route 3 (three) times daily before meals. 1 Device 0   cetirizine (ZYRTEC) 10 MG tablet Take 1 tablet (10 mg total) by mouth daily. 30 tablet 1   cinacalcet (SENSIPAR) 30 MG tablet Take 1 tablet by mouth once a day (Patient taking differently: Take 30 mg by mouth daily in the afternoon.) 90 tablet 3   DULoxetine (CYMBALTA) 60 MG capsule TAKE 1 CAPSULE (60 MG TOTAL) BY MOUTH DAILY. (Patient taking differently: Take 60 mg by mouth daily in the afternoon.) 30 capsule 2   ethyl chloride spray SPRAY 1 SPRAY TO THE SKIN 3 TIMES A WEEK AS  NEEDED. SPRAY A SMALL AMOUNT JUST PRIOR TO NEEDLE INSERTION. 116 mL 3   gabapentin (NEURONTIN) 300 MG capsule TAKE 1 CAPSULE (300 MG TOTAL) BY MOUTH AT BEDTIME. (Patient taking differently: Take 300 mg by mouth daily in the afternoon.) 30 capsule 2   glucose blood (TRUE METRIX BLOOD GLUCOSE TEST) test strip Use 3 times daily before meals 100 each 3   insulin glargine (LANTUS) 100 UNIT/ML injection INJECT SUBCUTANEOUSLY TWICE DAILY 15 UNITS IN THE MORNING AND 10 IN THE EVENING (Patient taking differently: Inject 7-18 Units into the skin See admin instructions. Inject 18 units subcutaneously in the morning & inject 7 units subcutaneously in the afternoon.) 30 mL 6   Insulin Syringe-Needle U-100 (TRUEPLUS INSULIN SYRINGE) 31G X 5/16" 1 ML MISC USE AS DIRECTED 100 each 5   lisinopril (ZESTRIL) 20 MG tablet TAKE 1 TABLET (20 MG TOTAL) BY MOUTH DAILY. 30 tablet 5   methocarbamol (ROBAXIN) 500 MG tablet TAKE 1 TABLET (500 MG TOTAL) BY MOUTH 2 (TWO) TIMES DAILY AS NEEDED FOR MUSCLE SPASMS. (Patient not taking: Reported on 06/14/2021) 30 tablet 2   Nutritional Supplements (FEEDING SUPPLEMENT, NEPRO CARB STEADY,) LIQD Take 237 mLs by mouth See admin instructions. Take 1 can (237 ml) by mouth once daily on Tuesdays, Thursdays & Saturday morning. May take an additional can (237 ml) by mouth if needed for poor appetite.     omeprazole (PRILOSEC) 20 MG capsule TAKE 1 CAPSULE (20 MG TOTAL) BY MOUTH DAILY. (Patient not taking: Reported on 06/10/2021) 30 capsule 3   sucroferric oxyhydroxide (VELPHORO) 500 MG chewable tablet Chew 500 mg by mouth 3 (three) times daily with meals.     TRUEplus Lancets 28G MISC 1 each by Other route 3 (three) times daily. 100 each 11   Facility-Administered Medications Ordered in Other Encounters  Medication Dose Route Frequency Provider Last Rate Last Admin   0.9 %  sodium chloride infusion   Intravenous Continuous Monia Sabal, PA-C        REVIEW OF SYSTEMS:  [X]  denotes positive  finding, [ ]  denotes negative finding Cardiac  Comments:  Chest pain or chest pressure:  Shortness of breath upon exertion:    Short of breath when lying flat:    Irregular heart rhythm:        Vascular    Pain in calf, thigh, or hip brought on by ambulation:    Pain in feet at night that wakes you up from your sleep:     Blood clot in your veins:    Leg swelling:         Pulmonary    Oxygen at home:    Productive cough:     Wheezing:         Neurologic    Sudden weakness in arms or legs:     Sudden numbness in arms or legs:     Sudden onset of difficulty speaking or slurred speech:    Temporary loss of vision in one eye:     Problems with dizziness:         Gastrointestinal    Blood in stool:     Vomited blood:         Genitourinary    Burning when urinating:     Blood in urine:        Psychiatric    Major depression:         Hematologic    Bleeding problems:    Problems with blood clotting too easily:        Skin    Rashes or ulcers:        Constitutional    Fever or chills:      PHYSICAL EXAM See nursing documentation for vital signs. On my exam, the patient's heart rate was in the 80s.  Blood pressure 105/53.  Oxygen saturation greater 95% on no supplemental oxygen.  Constitutional: Chronically ill appearing.  No distress. Appears well nourished.  Neurologic: CN intact. no focal findings. no sensory loss. Psychiatric:  Mood and affect symmetric and appropriate. Eyes:  No icterus. No conjunctival pallor. Ears, nose, throat:  mucous membranes moist. Midline trachea.  Cardiac: regular rate and rhythm.  Respiratory:  unlabored. Abdominal: not distended Peripheral vascular: left arm AVG pulsatile thrill. Mild surrounding ecchymosis. Edema in the axilla. Graft has segment >8cm that is easily palpable under the skin. Extremity: no cyanosis. no pallor.  Skin: no gangrene. no ulceration.  Lymphatic: no Stemmer's sign. no palpable  lymphadenopathy.  PERTINENT LABORATORY AND RADIOLOGIC DATA  Most recent CBC CBC Latest Ref Rng & Units 03/11/2021 02/28/2021 12/29/2020  WBC 4.0 - 10.5 K/uL - - -  Hemoglobin 12.0 - 15.0 g/dL 12.2 13.3 8.5(L)  Hematocrit 36.0 - 46.0 % 36.0 39.0 25.0(L)  Platelets 150 - 400 K/uL - - -     Most recent CMP CMP Latest Ref Rng & Units 03/11/2021 02/28/2021 12/29/2020  Glucose 70 - 99 mg/dL 151(H) 72 64(L)  BUN 8 - 23 mg/dL 47(H) 50(H) >130(H)  Creatinine 0.44 - 1.00 mg/dL 6.10(H) 7.20(H) 16.70(H)  Sodium 135 - 145 mmol/L 136 138 134(L)  Potassium 3.5 - 5.1 mmol/L 5.0 4.9 6.4(HH)  Chloride 98 - 111 mmol/L 103 99 101  CO2 22 - 32 mmol/L - - -  Calcium 8.9 - 10.3 mg/dL - - -  Total Protein 6.0 - 8.5 g/dL - - -  Total Bilirubin 0.0 - 1.2 mg/dL - - -  Alkaline Phos 44 - 121 IU/L - - -  AST 0 - 40 IU/L - - -  ALT 0 - 32 IU/L - - -    Renal function CrCl cannot be calculated (Patient's  most recent lab result is older than the maximum 21 days allowed.).  HbA1c, POC (controlled diabetic range) (%)  Date Value  02/14/2021 8.3 (A)    LDL Chol Calc (NIH)  Date Value Ref Range Status  02/14/2021 124 (H) 0 - 99 mg/dL Final    Yevonne Aline. Stanford Breed, MD [facility] Phone Number: [phone] 07/16/2021 1:44 PM  Total time spent on preparing this encounter including chart review, data review, collecting history, examining the patient, coordinating care for this established patient, 40 minutes.  Portions of this report may have been transcribed using voice recognition software.  Every effort has been made to ensure accuracy; however, inadvertent computerized transcription errors may still be present.

## 2021-07-16 NOTE — ED Provider Notes (Signed)
[facility] EMERGENCY DEPARTMENT Provider Note  CSN: [id number] Arrival date & time: 07/16/21 1156  Chief Complaint(s) Fissule issue  HPI Sarah Kelley is a 71 y.o. female with PMH ESRD on hemodialysis Tuesday Thursday Saturday, iron deficiency anemia, T2DM, thyroid disease who presents to the emergency department for evaluation of a fistula problem.  History obtained from EMS.  The patient was apparently at dialysis and was almost finished with her dialysis treatment when she reached up and grabbed the dialysis catheter and remove the catheter herself.  Patient apparently lost approximately 500 cc of blood per dialysis staff.  There is also a concern for unresponsiveness during this issue but by the time EMS arrived the patient had regained consciousness.  During this episode, per EMS, dialysis staff reaccessed the catheter site and administered intravenous fluids that then infiltrated the site.  These were then discontinued.  Patient arrives with a noticeable axillary swelling at the site of suspected infiltration.  She is denying chest pain, shortness of breath, Donnell pain, nausea, vomiting or other systemic symptoms.  HPI  Past Medical History Past Medical History:  Diagnosis Date   Diabetes mellitus    ESRD (end stage renal disease) ([place])    dialysis T/Th/Sa   GERD (gastroesophageal reflux disease)    Hyperlipidemia    Hypertension    Iron deficiency anemia    MRSA infection    hx of   Neuropathy    Diabetic   Pneumonia 07/2016   Renal disorder    Seizure-like activity ([place])    Sepsis ([place])    Thyroid disease    Wears dentures    Patient Active Problem List   Diagnosis Date Noted   Uremia 07/14/2020   Hyperkalemia 07/14/2020   Hypoglycemia    Elevated liver enzymes 06/14/2020   Other constipation 06/13/2020   AVF (arteriovenous fistula) (HCC)    MSSA (methicillin susceptible Staphylococcus aureus) infection    Nausea & vomiting 02/11/2018    Insomnia 01/18/2017   Altered mental status    Goals of care, counseling/discussion    Dysphagia 12/04/2016   UTI (urinary tract infection) 12/04/2016   Severe sepsis ([place]) 12/02/2016   Hyperglycemia 12/02/2016   Acute encephalopathy 07/28/2016   CAP (community acquired pneumonia) 07/28/2016   Pneumonia 07/28/2016   ESRD (end stage renal disease) ([place])    Phantom pain ([place]) 09/13/2015   Hyperlipidemia 06/01/2015   Toe amputation status    Insulin-requiring or dependent type II diabetes mellitus ([place])    Chronic kidney disease (CKD) stage G4/A1, severely decreased glomerular filtration rate (GFR) between 15-29 mL/min/1.73 square meter and albuminuria creatinine ratio less than 30 mg/g ([place]) 05/18/2015   Type 2 diabetes mellitus with chronic kidney disease on chronic dialysis, with long-term current use of insulin ([place]) 05/18/2015   Osteomyelitis ([place]) 05/18/2015   Generalized abdominal pain 05/18/2015   Osteomyelitis of left foot ([place]) 05/18/2015   Diabetic neuropathy ([place]) 01/14/2007   Diabetes mellitus without complication ([place]) 95/62/1308   HLD (hyperlipidemia) 01/14/2007   Normocytic anemia 01/14/2007   ANXIETY 01/14/2007   Depression 01/14/2007   Essential hypertension 01/14/2007   Home Medication(s) Prior to Admission medications   Medication Sig Start Date End Date Taking? Authorizing Provider  amLODipine (NORVASC) 2.5 MG tablet Take 1 tablet (2.5 mg total) by mouth daily. 06/10/21   Ladell Pier, MD  atorvastatin (LIPITOR) 40 MG tablet TAKE 1 TABLET (40 MG TOTAL) BY MOUTH DAILY. 02/14/21 02/14/22  [name], MD  Blood Glucose Monitoring Suppl (TRUE  METRIX METER) DEVI 1 each by Does not apply route 3 (three) times daily before meals. 07/25/17   [name], MD  cetirizine (ZYRTEC) 10 MG tablet Take 1 tablet (10 mg total) by mouth daily. 06/10/20   Mayers, Cari S, PA-C  cinacalcet (SENSIPAR) 30 MG tablet Take 1 tablet by mouth once a day Patient taking  differently: Take 30 mg by mouth daily in the afternoon. 06/14/21     DULoxetine (CYMBALTA) 60 MG capsule TAKE 1 CAPSULE (60 MG TOTAL) BY MOUTH DAILY. Patient taking differently: Take 60 mg by mouth daily in the afternoon. 04/27/21 04/27/22  [name], MD  ethyl chloride spray SPRAY 1 SPRAY TO THE SKIN 3 TIMES A WEEK AS NEEDED. SPRAY A SMALL AMOUNT JUST PRIOR TO NEEDLE INSERTION. 07/04/21   [name], MD  gabapentin (NEURONTIN) 300 MG capsule TAKE 1 CAPSULE (300 MG TOTAL) BY MOUTH AT BEDTIME. Patient taking differently: Take 300 mg by mouth daily in the afternoon. 06/10/21 06/10/22  Ladell Pier, MD  glucose blood (TRUE METRIX BLOOD GLUCOSE TEST) test strip Use 3 times daily before meals 11/08/20   [name], MD  insulin glargine (LANTUS) 100 UNIT/ML injection INJECT SUBCUTANEOUSLY TWICE DAILY 15 UNITS IN THE MORNING AND 10 IN THE EVENING Patient taking differently: Inject 7-18 Units into the skin See admin instructions. Inject 18 units subcutaneously in the morning & inject 7 units subcutaneously in the afternoon. 07/28/20 08/01/21  [name], MD  Insulin Syringe-Needle U-100 (TRUEPLUS INSULIN SYRINGE) 31G X 5/16" 1 ML MISC USE AS DIRECTED 03/11/21   [name], MD  lisinopril (ZESTRIL) 20 MG tablet TAKE 1 TABLET (20 MG TOTAL) BY MOUTH DAILY. 03/28/21 03/28/22  [name], MD  methocarbamol (ROBAXIN) 500 MG tablet TAKE 1 TABLET (500 MG TOTAL) BY MOUTH 2 (TWO) TIMES DAILY AS NEEDED FOR MUSCLE SPASMS. Patient not taking: Reported on 06/14/2021 07/28/20 07/28/21  [name], MD  Nutritional Supplements (FEEDING SUPPLEMENT, NEPRO CARB STEADY,) LIQD Take 237 mLs by mouth See admin instructions. Take 1 can (237 ml) by mouth once daily on Tuesdays, Thursdays & Saturday morning. May take an additional can (237 ml) by mouth if needed for poor appetite.    [provider]  omeprazole (PRILOSEC) 20 MG capsule TAKE 1 CAPSULE (20 MG TOTAL) BY MOUTH DAILY. Patient not  taking: Reported on 06/10/2021 01/05/21 01/05/22  Mayers, Loraine Grip, PA-C  sucroferric oxyhydroxide (VELPHORO) 500 MG chewable tablet Chew 500 mg by mouth 3 (three) times daily with meals.    [provider]  TRUEplus Lancets 28G MISC 1 each by Other route 3 (three) times daily. 02/14/21   [name], MD                                                                                                                                    Past Surgical History Past Surgical History:  Procedure Laterality Date   AMPUTATION Left 05/21/2015  Procedure: Left Foot 3rd Ray Amputation;  Surgeon: Newt Minion, MD;  Location: [place];  Service: Orthopedics;  Laterality: Left;   AV FISTULA PLACEMENT Left 03/28/2016   Procedure: ARTERIOVENOUS (AV) FISTULA CREATION LEFT ARM;  Surgeon: Waynetta Sandy, MD;  Location: [facility];  Service: Vascular;  Laterality: Left;   AV FISTULA PLACEMENT Right 06/04/2019   Procedure: ARTERIOVENOUS (AV) FISTULA CREATION RIGHT ARM;  Surgeon: Waynetta Sandy, MD;  Location: [place];  Service: Vascular;  Laterality: Right;   AV FISTULA PLACEMENT Left 03/11/2021   Procedure: INSERTION OF LEFT ARM ARTERIOVENOUS (AV) GORE-TEX GRAFT;  Surgeon: Waynetta Sandy, MD;  Location: [place];  Service: Vascular;  Laterality: Left;   DILATION AND CURETTAGE OF UTERUS     EYE SURGERY     FISTULA SUPERFICIALIZATION Left 09/04/2016   Procedure: FISTULA SUPERFICIALIZATION LEFT UPPER ARM;  Surgeon: Waynetta Sandy, MD;  Location: [facility];  Service: Vascular;  Laterality: Left;   FISTULA SUPERFICIALIZATION Right 10/08/2019   Procedure: FISTULA SUPERFICIALIZATION OR RIGHT ARM;  Surgeon: Waynetta Sandy, MD;  Location: [place];  Service: Vascular;  Laterality: Right;   IR AV DIALY SHUNT INTRO NEEDLE/INTRAC INITIAL W/PTA/STENT/IMG LT Left 06/13/2017   IR AV DIALY SHUNT INTRO NEEDLE/INTRAC INITIAL W/PTA/STENT/IMG LT Left 09/12/2017   IR AV DIALY SHUNT INTRO NEEDLE/INTRACATH  INITIAL W/PTA/IMG LEFT  11/10/2016   IR AV DIALY SHUNT INTRO NEEDLE/INTRACATH INITIAL W/PTA/IMG LEFT  03/09/2017   IR AV DIALY SHUNT INTRO NEEDLE/INTRACATH INITIAL W/PTA/IMG LEFT  12/12/2017   IR AV DIALY SHUNT INTRO NEEDLE/INTRACATH INITIAL W/PTA/IMG LEFT  02/27/2018   IR AV DIALY SHUNT INTRO NEEDLE/INTRACATH INITIAL W/PTA/IMG LEFT  06/05/2018   IR AV DIALY SHUNT INTRO NEEDLE/INTRACATH INITIAL W/PTA/IMG LEFT  09/11/2018   IR AV DIALY SHUNT INTRO NEEDLE/INTRACATH INITIAL W/PTA/IMG RIGHT Right 04/21/2020   IR AV DIALY SHUNT INTRO NEEDLE/INTRACATH INITIAL W/PTA/IMG RIGHT Right 10/08/2020   IR DIALY SHUNT INTRO NEEDLE/INTRACATH INITIAL W/IMG LEFT Left 05/08/2018   IR DIALY SHUNT INTRO NEEDLE/INTRACATH INITIAL W/IMG LEFT Left 06/17/2021   IR FLUORO GUIDE CV LINE RIGHT  02/01/2019   IR FLUORO GUIDE CV LINE RIGHT  07/14/2020   IR FLUORO GUIDE CV LINE RIGHT  12/29/2020   IR GENERIC HISTORICAL  08/01/2016   IR FLUORO GUIDE CV LINE RIGHT 08/01/2016 [name], MD [facility]   IR GENERIC HISTORICAL  08/01/2016   IR US GUIDE VASC ACCESS RIGHT 08/01/2016 [name], MD [facility]   IR REMOVAL TUN CV CATH W/O FL  11/01/2016   IR REMOVAL TUN CV CATH W/O FL  01/02/2020   IR REMOVAL TUN CV CATH W/O FL  05/04/2021   IR THROMBECTOMY AV FISTULA W/THROMBOLYSIS INC/SHUNT/IMG LEFT Left 01/31/2019   IR THROMBECTOMY AV FISTULA W/THROMBOLYSIS/PTA INC/SHUNT/IMG LEFT Left 01/27/2019   IR THROMBECTOMY AV FISTULA W/THROMBOLYSIS/PTA INC/SHUNT/IMG LEFT Left 07/16/2020   IR THROMBECTOMY AV FISTULA W/THROMBOLYSIS/PTA/STENT INC/SHUNT/IMG RT Right 09/24/2020   IR US GUIDE VASC ACCESS LEFT  11/10/2016   IR US GUIDE VASC ACCESS LEFT  09/12/2017   IR US GUIDE VASC ACCESS LEFT  12/12/2017   IR US GUIDE VASC ACCESS LEFT  06/05/2018   IR US GUIDE VASC ACCESS LEFT  09/11/2018   IR US GUIDE VASC ACCESS LEFT  01/27/2019   IR US GUIDE VASC ACCESS LEFT  02/01/2019   IR US GUIDE VASC ACCESS RIGHT  02/01/2019   IR US GUIDE [place] RIGHT  07/14/2020   IR US  GUIDE [place] RIGHT  07/16/2020  IR US GUIDE VASC ACCESS RIGHT  10/08/2020   IR US GUIDE VASC ACCESS RIGHT  12/29/2020   UPPER EXTREMITY VENOGRAPHY Bilateral 02/28/2021   Procedure: UPPER EXTREMITY VENOGRAPHY;  Surgeon: Waynetta Sandy, MD;  Location: [place] [facility];  Service: Cardiovascular;  Laterality: Bilateral;   Family History No family history on file.  Social History Social History   Tobacco Use   Smoking status: Never   Smokeless tobacco: Never  Vaping Use   Vaping Use: Never used  Substance Use Topics   Alcohol use: Not Currently    Alcohol/week: 0.0 standard drinks    Comment: rare- a beer every now and then   Drug use: No   Allergies Patient has no known allergies.  Review of Systems Review of Systems  Musculoskeletal:        Axillary swelling   Physical Exam Vital Signs  I have reviewed the triage vital signs BP (!) 152/109    Pulse 83    Resp 18    LMP  (LMP Unknown)    SpO2 98%   Physical Exam Vitals and nursing note reviewed.  Constitutional:      General: She is not in acute distress.    Appearance: She is well-developed.  HENT:     Head: Normocephalic and atraumatic.  Eyes:     Conjunctiva/sclera: Conjunctivae normal.  Cardiovascular:     Rate and Rhythm: Normal rate and regular rhythm.     Heart sounds: No murmur heard. Pulmonary:     Effort: Pulmonary effort is normal. No respiratory distress.     Breath sounds: Normal breath sounds.  Abdominal:     Palpations: Abdomen is soft.     Tenderness: There is no abdominal tenderness.  Musculoskeletal:        General: Swelling (Left upper extremity axillary swelling, bleeding controlled at fistula site) present.     Cervical back: Neck supple.  Skin:    General: Skin is warm and dry.     Capillary Refill: Capillary refill takes less than 2 seconds.  Neurological:     Mental Status: She is alert.  Psychiatric:        Mood and Affect: Mood normal.    ED Results and  Treatments Labs (all labs ordered are listed, but only abnormal results are displayed) Labs Reviewed  COMPREHENSIVE METABOLIC PANEL - Abnormal; Notable for the following components:      Result Value   Sodium 132 (*)    Potassium 3.0 (*)    Chloride 97 (*)    Glucose, Bld 158 (*)    Creatinine, Ser 2.86 (*)    Calcium 8.7 (*)    Total Protein 6.1 (*)    Albumin 2.8 (*)    Alkaline Phosphatase 187 (*)    GFR, Estimated 17 (*)    All other components within normal limits  CBC WITH DIFFERENTIAL/PLATELET - Abnormal; Notable for the following components:   RBC 3.86 (*)    Abs Immature Granulocytes 0.08 (*)    All other components within normal limits  Radiology No results found.  Pertinent labs & imaging results that were available during my care of the patient were reviewed by me and considered in my medical decision making (see MDM for details).  Medications Ordered in ED Medications  potassium chloride SA (KLOR-CON M) CR tablet 40 mEq (has no administration in time range)                                                                                                                                     Procedures Procedures  (including critical care time)  Medical Decision Making / ED Course   This patient presents to the ED for concern of IV infiltration at the site of a traumatic removal of a dialysis catheter, this involves an extensive number of treatment options, and is a complaint that carries with it a high risk of complications and morbidity.  The differential diagnosis includes Pseudoaneurysm, IV infiltration, dependent edema, hematoma  MDM: Patient seen emergency department for evaluation of a swelling at the site of her AV fistula after a traumatic removal during dialysis.  Patient arrives with a palpable swelling at the site of the axilla on the  left near the patient's dialysis fistula.  She had no persistent bleeding at the site.  I spoke with the vascular surgeon on-call Dr. Standley Dakins who came and evaluated the patient at bedside.  He wrapped the fistula at bedside and is recommending no additional imaging and states that the patient will be okay to resume dialysis on Tuesday.  Laboratory evaluation with some mild hypokalemia to 3.0 but no significant hemoglobin drop.  Potassium repleted here in the emergency department patient discharged in the care of her son.  It was communicated very clearly that she is at high risk of exsanguination if she continues to pull out her dialysis catheters during dialysis and she may need restraint during this if need be.   Additional history obtained: -Additional history obtained from son -External records from outside source obtained and reviewed including: Chart review including previous notes, labs, imaging, consultation notes   Lab Tests: -I ordered, reviewed, and interpreted labs.   The pertinent results include:   Labs Reviewed  COMPREHENSIVE METABOLIC PANEL - Abnormal; Notable for the following components:      Result Value   Sodium 132 (*)    Potassium 3.0 (*)    Chloride 97 (*)    Glucose, Bld 158 (*)    Creatinine, Ser 2.86 (*)    Calcium 8.7 (*)    Total Protein 6.1 (*)    Albumin 2.8 (*)    Alkaline Phosphatase 187 (*)    GFR, Estimated 17 (*)    All other components within normal limits  CBC WITH DIFFERENTIAL/PLATELET - Abnormal; Notable for the following components:   RBC 3.86 (*)    Abs Immature Granulocytes 0.08 (*)    All other components within normal limits  EKG   EKG Interpretation  Date/Time:  Saturday July 16 2021 12:08:45 EST Ventricular Rate:  76 PR Interval:  143 QRS Duration: 136 QT Interval:  479 QTC Calculation: 539 R Axis:   -41 Text Interpretation: Sinus rhythm Multiple premature complexes, vent & supraven RBBB and LAFB Confirmed by [place] ([id number]) on 07/16/2021 2:43:46 PM         Imaging Studies ordered: I ordered imaging studies including none I independently visualized and interpreted imaging. I agree with the radiologist interpretation   Medicines ordered and prescription drug management: Meds ordered this encounter  Medications   DISCONTD: lactated ringers bolus 1,000 mL   potassium chloride SA (KLOR-CON M) CR tablet 40 mEq    -I have reviewed the patients home medicines and have made adjustments as needed  Critical interventions none  Consultations Obtained: I requested consultation with the vascular surgeon,  and discussed lab and imaging findings as well as pertinent plan - they recommend: Ace wrap and resumption of dialysis on Tuesday   Cardiac Monitoring: The patient was maintained on a cardiac monitor.  I personally viewed and interpreted the cardiac monitored which showed an underlying rhythm of: Normal sinus rhythm  Social Determinants of Health:  Factors impacting patients care include: none   Reevaluation: After the interventions noted above, I reevaluated the patient and found that they have :improved  Co morbidities that complicate the patient evaluation  Past Medical History:  Diagnosis Date   Diabetes mellitus    ESRD (end stage renal disease) ([place])    dialysis T/Th/Sa   GERD (gastroesophageal reflux disease)    Hyperlipidemia    Hypertension    Iron deficiency anemia    MRSA infection    hx of   Neuropathy    Diabetic   Pneumonia 07/2016   Renal disorder    Seizure-like activity ([place])    Sepsis ([place])    Thyroid disease    Wears dentures       Dispostion: dc     Final Clinical Impression(s) / ED Diagnoses Final diagnoses:  Intravenous infiltration, initial encounter     @PCDICTATION @    Teressa Lower, MD 07/16/21 1609

## 2021-07-19 ENCOUNTER — Other Ambulatory Visit: Payer: Self-pay

## 2021-07-20 ENCOUNTER — Other Ambulatory Visit: Payer: Self-pay

## 2021-07-22 ENCOUNTER — Other Ambulatory Visit: Payer: Self-pay

## 2021-07-22 ENCOUNTER — Encounter (HOSPITAL_COMMUNITY)
Admission: RE | Disposition: A | Discharge status: Home / Self Care | Payer: Self-pay | Source: Home / Self Care | Attending: Vascular Surgery

## 2021-07-22 ENCOUNTER — Ambulatory Visit (HOSPITAL_COMMUNITY)
Admission: RE | Admit: 2021-07-22 | Discharge: 2021-07-22 | Disposition: A | Discharge status: Home / Self Care | Payer: Self-pay | Attending: Vascular Surgery | Admitting: Vascular Surgery

## 2021-07-22 DIAGNOSIS — T82858A Stenosis of vascular prosthetic devices, implants and grafts, initial encounter: Secondary | ICD-10-CM | POA: Insufficient documentation

## 2021-07-22 DIAGNOSIS — Z794 Long term (current) use of insulin: Secondary | ICD-10-CM | POA: Insufficient documentation

## 2021-07-22 DIAGNOSIS — Y841 Kidney dialysis as the cause of abnormal reaction of the patient, or of later complication, without mention of misadventure at the time of the procedure: Secondary | ICD-10-CM | POA: Insufficient documentation

## 2021-07-22 DIAGNOSIS — E1122 Type 2 diabetes mellitus with diabetic chronic kidney disease: Secondary | ICD-10-CM | POA: Insufficient documentation

## 2021-07-22 DIAGNOSIS — N186 End stage renal disease: Secondary | ICD-10-CM | POA: Insufficient documentation

## 2021-07-22 DIAGNOSIS — I120 Hypertensive chronic kidney disease with stage 5 chronic kidney disease or end stage renal disease: Secondary | ICD-10-CM | POA: Insufficient documentation

## 2021-07-22 DIAGNOSIS — Z992 Dependence on renal dialysis: Secondary | ICD-10-CM | POA: Insufficient documentation

## 2021-07-22 HISTORY — PX: PERIPHERAL VASCULAR BALLOON ANGIOPLASTY: CATH118281

## 2021-07-22 LAB — POCT I-STAT, CHEM 8
BUN: 26 mg/dL — ABNORMAL HIGH (ref 8–23)
Calcium, Ion: 1.24 mmol/L (ref 1.15–1.40)
Chloride: 94 mmol/L — ABNORMAL LOW (ref 98–111)
Creatinine, Ser: 5 mg/dL — ABNORMAL HIGH (ref 0.44–1.00)
Glucose, Bld: 359 mg/dL — ABNORMAL HIGH (ref 70–99)
HCT: 35 % — ABNORMAL LOW (ref 36.0–46.0)
Hemoglobin: 11.9 g/dL — ABNORMAL LOW (ref 12.0–15.0)
Potassium: 3.3 mmol/L — ABNORMAL LOW (ref 3.5–5.1)
Sodium: 138 mmol/L (ref 135–145)
TCO2: 34 mmol/L — ABNORMAL HIGH (ref 22–32)

## 2021-07-22 LAB — GLUCOSE, CAPILLARY
Glucose-Capillary: 360 mg/dL — ABNORMAL HIGH (ref 70–99)
Glucose-Capillary: 338 mg/dL — ABNORMAL HIGH (ref 70–99)

## 2021-07-22 SURGERY — PERIPHERAL VASCULAR BALLOON ANGIOPLASTY
Anesthesia: LOCAL | Laterality: Left

## 2021-07-22 MED ORDER — HEPARIN (PORCINE) IN NACL 1000-0.9 UT/500ML-% IV SOLN
INTRAVENOUS | Status: DC | PRN
  Administered 2021-07-22: 500 mL

## 2021-07-22 MED ORDER — HEPARIN (PORCINE) IN NACL 1000-0.9 UT/500ML-% IV SOLN
INTRAVENOUS | Status: AC
  Filled 2021-07-22: qty 500

## 2021-07-22 MED ORDER — SODIUM CHLORIDE 0.9% FLUSH: 3.0000 mL | INTRAVENOUS | Status: DC | PRN

## 2021-07-22 MED ORDER — SODIUM CHLORIDE 0.9 % IV SOLN: 250.0000 mL | INTRAVENOUS | Status: DC | PRN

## 2021-07-22 MED ORDER — LIDOCAINE HCL (PF) 1 % IJ SOLN
INTRAMUSCULAR | Status: DC | PRN
  Administered 2021-07-22: 2 mL via INTRADERMAL

## 2021-07-22 MED ORDER — LIDOCAINE HCL (PF) 1 % IJ SOLN
INTRAMUSCULAR | Status: AC
  Filled 2021-07-22: qty 30

## 2021-07-22 MED ORDER — SODIUM CHLORIDE 0.9% FLUSH
3.0000 mL | Freq: Two times a day (BID) | INTRAVENOUS | Status: DC
Start: 2021-07-22 — End: 2021-07-22

## 2021-07-22 MED ORDER — IODIXANOL 320 MG/ML IV SOLN
INTRAVENOUS | Status: DC | PRN
  Administered 2021-07-22: 25 mL via INTRAVENOUS

## 2021-07-22 SURGICAL SUPPLY — 16 items
BAG SNAP BAND KOVER 36X36 (MISCELLANEOUS) ×3 IMPLANT
BALLN MUSTANG 10.0X40 75 (BALLOONS) ×2
BALLOON MUSTANG 10.0X40 75 (BALLOONS) IMPLANT
CATH CROSS OVER TEMPO 5F (CATHETERS) ×1 IMPLANT
COVER DOME SNAP 22 D (MISCELLANEOUS) ×3 IMPLANT
GUIDEWIRE ANGLED .035X150CM (WIRE) ×1 IMPLANT
KIT ENCORE 26 ADVANTAGE (KITS) ×1 IMPLANT
KIT MICROPUNCTURE NIT STIFF (SHEATH) ×1 IMPLANT
PROTECTION STATION PRESSURIZED (MISCELLANEOUS) ×2
SHEATH PINNACLE R/O II 7F 4CM (SHEATH) ×1 IMPLANT
SHEATH PROBE COVER 6X72 (BAG) ×3 IMPLANT
STATION PROTECTION PRESSURIZED (MISCELLANEOUS) ×2 IMPLANT
STOPCOCK MORSE 400PSI 3WAY (MISCELLANEOUS) ×3 IMPLANT
TRAY PV CATH (CUSTOM PROCEDURE TRAY) ×3 IMPLANT
TUBING CIL FLEX 10 FLL-RA (TUBING) ×3 IMPLANT
WIRE BENTSON .035X145CM (WIRE) ×1 IMPLANT

## 2021-07-22 NOTE — Op Note (Signed)
DATE OF SERVICE: 07/22/2021  PATIENT:  Sarah Kelley  71 y.o. female  PRE-OPERATIVE DIAGNOSIS:  ESRD  POST-OPERATIVE DIAGNOSIS:  Same  PROCEDURE:   1) US guided LUE AVG access 2) Left arm fistulagram (52mL) 3) Left subclavian angioplasty (10x70mm Mustang)  SURGEON:  Yevonne Aline. Stanford Breed, MD  ASSISTANT: none  ANESTHESIA:   local  ESTIMATED BLOOD LOSS: minimal  LOCAL MEDICATIONS USED:  LIDOCAINE   COUNTS: confirmed correct.  PATIENT DISPOSITION:  PACU - hemodynamically stable.   Delay start of Pharmacological VTE agent (>24hrs) due to surgical blood loss or risk of bleeding: no  INDICATION FOR PROCEDURE: Sarah Kelley is a 71 y.o. female with ESRD dialyzing via left arm arteriovenous graft; she had infiltration with a swollen left arm which I saw her for in the ER last weekend. After careful discussion of risks, benefits, and alternatives the patient was offered fistulagram. The patient understood and wished to proceed.  OPERATIVE FINDINGS:  Severe subclavian artery stenosis about a previously placed cephalic arch self-expanding stent.  I was able to navigate a wire across the stenosis fairly easily.  The lesion responded well to balloon angioplasty with a 10 x 40 mm balloon  DESCRIPTION OF PROCEDURE: After identification of the patient in the pre-operative holding area, the patient was transferred to the operating room. The patient was positioned supine on the operating room table.  The left arm was prepped and draped in standard fashion. A surgical pause was performed confirming correct patient, procedure, and operative location.  The left arm was anesthetized with subcutaneous injection of 1% lidocaine. Using ultrasound guidance, the arteriovenous graft was accessed with micropuncture technique. Fluoroscopy was used to confirm cannulation over the femoral head. The 55F sheath was upsized to 41F.   Fistulography was performed.  This revealed a severe, flow-limiting  stenosis in the left subclavian artery.  There was no other stenosis noted in the venous anastomosis or graft.    A Benson wire was advanced into central veins.  Over the wire a 10 x 40 mm Mustang balloon was delivered across the lesion.  Angioplasty was performed.  Follow-up fistulogram revealed near resolution of the stenosis.  Satisfied we ended the case here.  Endovascular equipment was removed.  I figure-of-eight stitch was placed around the access point and secured down.  Hemostasis was achieved.  A sterile bandage was applied  Upon completion of the case instrument and sharps counts were confirmed correct. The patient was transferred to the PACU in good condition. I was present for all portions of the procedure.  PLAN: Continue hemodialysis through the left upper extremity graft as tolerated.  Follow-up with me as needed.  Yevonne Aline. Stanford Breed, MD [facility] Phone Number: [phone] 07/22/2021 9:09 AM

## 2021-07-22 NOTE — Interval H&P Note (Signed)
History and Physical Interval Note:  07/22/2021 9:08 AM  Sarah Kelley  has presented today for surgery, with the diagnosis of End Stage Renal.  The various methods of treatment have been discussed with the patient and family. After consideration of risks, benefits and other options for treatment, the patient has consented to  Procedure(s): A/V FISTULAGRAM (Left) as a surgical intervention.  The patient's history has been reviewed, patient examined, no change in status, stable for surgery.  I have reviewed the patient's chart and labs.  Questions were answered to the patient's satisfaction.     Cherre Robins

## 2021-07-25 ENCOUNTER — Encounter (HOSPITAL_COMMUNITY): Payer: Self-pay | Admitting: Vascular Surgery

## 2021-07-28 ENCOUNTER — Other Ambulatory Visit: Payer: Self-pay

## 2021-07-28 ENCOUNTER — Other Ambulatory Visit: Payer: Self-pay | Admitting: Family Medicine

## 2021-07-28 DIAGNOSIS — E1149 Type 2 diabetes mellitus with other diabetic neurological complication: Secondary | ICD-10-CM

## 2021-07-28 NOTE — Telephone Encounter (Signed)
Requested medication (s) are due for refill today:   Yes  Requested medication (s) are on the active medication list:   Yes  Future visit scheduled:   Yes   Last ordered: 04/27/2021 #30, 2 refills  Returned because no refills remain on this rx   Requested Prescriptions  Pending Prescriptions Disp Refills   DULoxetine (CYMBALTA) 60 MG capsule 30 capsule 2    Sig: TAKE 1 CAPSULE (60 MG TOTAL) BY MOUTH DAILY.     Psychiatry: Antidepressants - SNRI Failed - 07/28/2021  8:45 AM      Failed - Completed PHQ-2 or PHQ-9 in the last 360 days      Passed - Last BP in normal range    BP Readings from Last 1 Encounters:  07/22/21 (!) 138/50          Passed - Valid encounter within last 6 months    Recent Outpatient Visits           1 month ago Type 2 diabetes mellitus with chronic kidney disease on chronic dialysis, with long-term current use of insulin ([place])   [place], Neoma Laming B, MD   5 months ago Type 2 diabetes mellitus with chronic kidney disease on chronic dialysis, with long-term current use of insulin ([place])   [facility] [place], Charlane Ferretti, MD   1 year ago Muscle cramp   [place], Charlane Ferretti, MD   1 year ago Type 2 diabetes mellitus with chronic kidney disease on chronic dialysis, with long-term current use of insulin ([place])   [place], [place], PA-C   1 year ago Type 2 diabetes mellitus with chronic kidney disease on chronic dialysis, with long-term current use of insulin ([place])   [place], [name], MD       Future Appointments             In 2 weeks [name], MD [place]

## 2021-07-29 ENCOUNTER — Other Ambulatory Visit: Payer: Self-pay

## 2021-07-29 MED ORDER — DULOXETINE HCL 60 MG PO CPEP
ORAL_CAPSULE | Freq: Every day | ORAL | 2 refills | Status: DC
  Filled 2021-07-29: qty 30, 30d supply, fill #0
  Filled 2021-08-29: qty 30, 30d supply, fill #1
  Filled 2021-09-27: qty 30, 30d supply, fill #2

## 2021-08-01 ENCOUNTER — Other Ambulatory Visit: Payer: Self-pay

## 2021-08-03 ENCOUNTER — Other Ambulatory Visit: Payer: Self-pay

## 2021-08-11 ENCOUNTER — Other Ambulatory Visit: Payer: Self-pay

## 2021-08-11 ENCOUNTER — Other Ambulatory Visit (HOSPITAL_COMMUNITY): Payer: Self-pay

## 2021-08-17 ENCOUNTER — Ambulatory Visit: Payer: Self-pay | Admitting: Family Medicine

## 2021-08-18 ENCOUNTER — Other Ambulatory Visit: Payer: Self-pay

## 2021-08-19 ENCOUNTER — Other Ambulatory Visit (HOSPITAL_COMMUNITY): Payer: Self-pay

## 2021-08-19 ENCOUNTER — Other Ambulatory Visit: Payer: Self-pay

## 2021-08-23 ENCOUNTER — Other Ambulatory Visit: Payer: Self-pay | Admitting: Family Medicine

## 2021-08-23 DIAGNOSIS — E1122 Type 2 diabetes mellitus with diabetic chronic kidney disease: Secondary | ICD-10-CM

## 2021-08-23 DIAGNOSIS — Z992 Dependence on renal dialysis: Secondary | ICD-10-CM

## 2021-08-24 ENCOUNTER — Other Ambulatory Visit (HOSPITAL_COMMUNITY): Payer: Self-pay

## 2021-08-24 ENCOUNTER — Other Ambulatory Visit: Payer: Self-pay

## 2021-08-24 MED ORDER — INSULIN GLARGINE 100 UNIT/ML ~~LOC~~ SOLN
SUBCUTANEOUS | 0 refills | Status: DC
  Filled 2021-08-24: qty 10, 28d supply, fill #0

## 2021-08-24 NOTE — Telephone Encounter (Signed)
Requested medication (s) are due for refill today: Yes  Requested medication (s) are on the active medication list: Yes  Last refill:  07/28/20  Future visit scheduled: No  Notes to clinic:  Prescription expired.    Requested Prescriptions  Pending Prescriptions Disp Refills   insulin glargine (LANTUS) 100 UNIT/ML injection 30 mL 6    Sig: INJECT SUBCUTANEOUSLY TWICE DAILY 15 UNITS IN THE MORNING AND 10 IN THE EVENING     Endocrinology:  Diabetes - Insulins Failed - 08/23/2021 10:22 PM      Failed - HBA1C is between 0 and 7.9 and within 180 days    HbA1c, POC (controlled diabetic range)  Date Value Ref Range Status  02/14/2021 8.3 (A) 0.0 - 7.0 % Final          Passed - Valid encounter within last 6 months    Recent Outpatient Visits           2 months ago Type 2 diabetes mellitus with chronic kidney disease on chronic dialysis, with long-term current use of insulin ([place])   [place], Neoma Laming B, MD   6 months ago Type 2 diabetes mellitus with chronic kidney disease on chronic dialysis, with long-term current use of insulin ([place])   [place], Charlane Ferretti, MD   1 year ago Muscle cramp   [place], [place], MD   1 year ago Type 2 diabetes mellitus with chronic kidney disease on chronic dialysis, with long-term current use of insulin ([place])   [place], [place], [place]   2 years ago Type 2 diabetes mellitus with chronic kidney disease on chronic dialysis, with long-term current use of insulin ([place])   [facility] [place], Charlane Ferretti, MD

## 2021-08-25 ENCOUNTER — Other Ambulatory Visit: Payer: Self-pay

## 2021-08-25 ENCOUNTER — Other Ambulatory Visit (HOSPITAL_COMMUNITY): Payer: Self-pay

## 2021-08-30 ENCOUNTER — Other Ambulatory Visit: Payer: Self-pay

## 2021-08-30 ENCOUNTER — Other Ambulatory Visit (HOSPITAL_COMMUNITY): Payer: Self-pay

## 2021-08-31 ENCOUNTER — Other Ambulatory Visit: Payer: Self-pay

## 2021-09-12 ENCOUNTER — Other Ambulatory Visit (HOSPITAL_COMMUNITY): Payer: Self-pay

## 2021-09-12 ENCOUNTER — Other Ambulatory Visit: Payer: Self-pay

## 2021-09-15 ENCOUNTER — Other Ambulatory Visit: Payer: Self-pay | Admitting: Internal Medicine

## 2021-09-16 ENCOUNTER — Other Ambulatory Visit: Payer: Self-pay

## 2021-09-16 MED ORDER — GABAPENTIN 300 MG PO CAPS
ORAL_CAPSULE | Freq: Every day | ORAL | 2 refills | Status: DC
  Filled 2021-09-16: qty 30, 30d supply, fill #0
  Filled 2021-10-19: qty 30, 30d supply, fill #1
  Filled 2021-11-18: qty 30, 30d supply, fill #2

## 2021-09-27 ENCOUNTER — Other Ambulatory Visit: Payer: Self-pay | Admitting: Family Medicine

## 2021-09-27 DIAGNOSIS — I10 Essential (primary) hypertension: Secondary | ICD-10-CM

## 2021-09-28 ENCOUNTER — Other Ambulatory Visit: Payer: Self-pay

## 2021-09-28 MED ORDER — LISINOPRIL 20 MG PO TABS
ORAL_TABLET | Freq: Every day | ORAL | 2 refills | Status: DC
  Filled 2021-09-28: qty 30, 30d supply, fill #0
  Filled 2021-10-27: qty 30, 30d supply, fill #1
  Filled 2021-11-30: qty 30, 30d supply, fill #2

## 2021-09-30 ENCOUNTER — Other Ambulatory Visit: Payer: Self-pay

## 2021-10-04 ENCOUNTER — Other Ambulatory Visit: Payer: Self-pay

## 2021-10-12 ENCOUNTER — Other Ambulatory Visit: Payer: Self-pay | Admitting: Family Medicine

## 2021-10-12 ENCOUNTER — Other Ambulatory Visit: Payer: Self-pay

## 2021-10-12 DIAGNOSIS — E1122 Type 2 diabetes mellitus with diabetic chronic kidney disease: Secondary | ICD-10-CM

## 2021-10-12 MED ORDER — ATORVASTATIN CALCIUM 40 MG PO TABS
ORAL_TABLET | Freq: Every day | ORAL | 0 refills | Status: DC
  Filled 2021-10-12: qty 30, 30d supply, fill #0

## 2021-10-12 MED ORDER — INSULIN GLARGINE 100 UNIT/ML ~~LOC~~ SOLN
SUBCUTANEOUS | 0 refills | Status: DC
  Filled 2021-10-12: qty 10, 30d supply, fill #0

## 2021-10-13 ENCOUNTER — Other Ambulatory Visit: Payer: Self-pay

## 2021-10-20 ENCOUNTER — Other Ambulatory Visit: Payer: Self-pay

## 2021-10-21 ENCOUNTER — Other Ambulatory Visit: Payer: Self-pay

## 2021-10-27 ENCOUNTER — Other Ambulatory Visit: Payer: Self-pay | Admitting: Family Medicine

## 2021-10-27 DIAGNOSIS — E1149 Type 2 diabetes mellitus with other diabetic neurological complication: Secondary | ICD-10-CM

## 2021-10-28 ENCOUNTER — Other Ambulatory Visit: Payer: Self-pay

## 2021-10-31 ENCOUNTER — Other Ambulatory Visit: Payer: Self-pay

## 2021-11-16 ENCOUNTER — Encounter: Payer: Self-pay | Admitting: Family Medicine

## 2021-11-16 ENCOUNTER — Other Ambulatory Visit: Payer: Self-pay

## 2021-11-16 ENCOUNTER — Ambulatory Visit: Payer: Self-pay | Attending: Family Medicine | Admitting: Family Medicine

## 2021-11-16 VITALS — BP 143/71 | HR 57 | Ht 63.0 in

## 2021-11-16 DIAGNOSIS — I10 Essential (primary) hypertension: Secondary | ICD-10-CM

## 2021-11-16 DIAGNOSIS — Z1211 Encounter for screening for malignant neoplasm of colon: Secondary | ICD-10-CM

## 2021-11-16 DIAGNOSIS — E1149 Type 2 diabetes mellitus with other diabetic neurological complication: Secondary | ICD-10-CM

## 2021-11-16 DIAGNOSIS — Z992 Dependence on renal dialysis: Secondary | ICD-10-CM

## 2021-11-16 DIAGNOSIS — E1122 Type 2 diabetes mellitus with diabetic chronic kidney disease: Secondary | ICD-10-CM

## 2021-11-16 DIAGNOSIS — Z794 Long term (current) use of insulin: Secondary | ICD-10-CM

## 2021-11-16 DIAGNOSIS — E2839 Other primary ovarian failure: Secondary | ICD-10-CM

## 2021-11-16 DIAGNOSIS — N186 End stage renal disease: Secondary | ICD-10-CM

## 2021-11-16 LAB — GLUCOSE, POCT (MANUAL RESULT ENTRY): POC Glucose: 79 mg/dl (ref 70–99)

## 2021-11-16 LAB — POCT GLYCOSYLATED HEMOGLOBIN (HGB A1C): HbA1c, POC (controlled diabetic range): 8 % — AB (ref 0.0–7.0)

## 2021-11-16 MED ORDER — ATORVASTATIN CALCIUM 40 MG PO TABS
ORAL_TABLET | Freq: Every day | ORAL | 6 refills | Status: AC
  Filled 2021-11-16: qty 30, 30d supply, fill #0
  Filled 2021-12-14: qty 30, 30d supply, fill #1
  Filled 2022-01-12: qty 30, 30d supply, fill #2
  Filled 2022-02-14: qty 30, 30d supply, fill #3

## 2021-11-16 MED ORDER — INSULIN GLARGINE 100 UNIT/ML ~~LOC~~ SOLN
SUBCUTANEOUS | 6 refills | Status: DC
  Filled 2021-11-16: qty 30, 90d supply, fill #0

## 2021-11-16 MED ORDER — DULOXETINE HCL 60 MG PO CPEP
ORAL_CAPSULE | Freq: Every day | ORAL | 6 refills | Status: DC
  Filled 2021-11-16: qty 30, 30d supply, fill #0
  Filled 2021-12-14: qty 30, 30d supply, fill #1
  Filled 2022-01-12: qty 30, 30d supply, fill #2
  Filled 2022-02-14: qty 30, 30d supply, fill #3

## 2021-11-16 NOTE — Progress Notes (Signed)
? ?Subjective:  ?Patient ID: Sarah Kelley, female    DOB: [birth year]  Age: 71 y.o. MRN: [id number] ? ?CC: Diabetes ? ? ?HPI ?Sarah Kelley is a 71 y.o. 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 ?Accompanied by son to today's visit and she is seen with aid of a Spanish interpreter. ? ?Interval History: ?Son states when she runs out of her medication she gets into a bad mood. Review of her medication reveals she has been out Cymbalta as well as Lipitor and will soon be running out of Lantus. ? ?Random blood sugars are 250-350; she has not had hypoglycemic episodes. Son has been adjusting her insulin and administering 16 units in the morning and 4 units in the pm and at other times he has her skip the pm dose of Lantus if sugars are around 220 or less to prevent blood sugars from running in the low 100s.  He states if sugars are closer to 300 he administers 8 units of Lantus instead. ?Hemodialysis sessions are going well. ?Past Medical History:  ?Diagnosis Date  ? Diabetes mellitus   ? ESRD (end stage renal disease) ([place])   ? dialysis T/Th/Sa  ? GERD (gastroesophageal reflux disease)   ? Hyperlipidemia   ? Hypertension   ? Iron deficiency anemia   ? MRSA infection   ? hx of  ? Neuropathy   ? Diabetic  ? Pneumonia 07/2016  ? Renal disorder   ? Seizure-like activity ([place])   ? Sepsis ([place])   ? Thyroid disease   ? Wears dentures   ? ? ?Past Surgical History:  ?Procedure Laterality Date  ? AMPUTATION Left 05/21/2015  ? Procedure: Left Foot 3rd Ray Amputation;  Surgeon: Newt Minion, MD;  Location: [facility];  Service: Orthopedics;  Laterality: Left;  ? AV FISTULA PLACEMENT Left 03/28/2016  ? Procedure: ARTERIOVENOUS (AV) FISTULA CREATION LEFT ARM;  Surgeon: Waynetta Sandy, MD;  Location: [facility];  Service: Vascular;   Laterality: Left;  ? AV FISTULA PLACEMENT Right 06/04/2019  ? Procedure: ARTERIOVENOUS (AV) FISTULA CREATION RIGHT ARM;  Surgeon: Waynetta Sandy, MD;  Location: [facility];  Service: Vascular;  Laterality: Right;  ? AV FISTULA PLACEMENT Left 03/11/2021  ? Procedure: INSERTION OF LEFT ARM ARTERIOVENOUS (AV) GORE-TEX GRAFT;  Surgeon: Waynetta Sandy, MD;  Location: [facility];  Service: Vascular;  Laterality: Left;  ? DILATION AND CURETTAGE OF UTERUS    ? EYE SURGERY    ? FISTULA SUPERFICIALIZATION Left 09/04/2016  ? Procedure: FISTULA SUPERFICIALIZATION LEFT UPPER ARM;  Surgeon: Waynetta Sandy, MD;  Location: [facility];  Service: Vascular;  Laterality: Left;  ? FISTULA SUPERFICIALIZATION Right 10/08/2019  ? Procedure: FISTULA SUPERFICIALIZATION OR RIGHT ARM;  Surgeon: Waynetta Sandy, MD;  Location: [facility];  Service: Vascular;  Laterality: Right;  ? IR AV DIALY SHUNT INTRO NEEDLE/INTRAC INITIAL W/PTA/STENT/IMG LT Left 06/13/2017  ? IR AV DIALY SHUNT INTRO NEEDLE/INTRAC INITIAL W/PTA/STENT/IMG LT Left 09/12/2017  ? IR AV DIALY SHUNT INTRO NEEDLE/INTRACATH INITIAL W/PTA/IMG LEFT  11/10/2016  ? IR AV DIALY SHUNT INTRO NEEDLE/INTRACATH INITIAL W/PTA/IMG LEFT  03/09/2017  ? IR AV DIALY SHUNT INTRO NEEDLE/INTRACATH INITIAL W/PTA/IMG LEFT  12/12/2017  ? IR AV DIALY SHUNT INTRO NEEDLE/INTRACATH INITIAL W/PTA/IMG LEFT  02/27/2018  ? IR AV DIALY SHUNT INTRO NEEDLE/INTRACATH INITIAL W/PTA/IMG LEFT  06/05/2018  ? IR AV  DIALY SHUNT INTRO NEEDLE/INTRACATH INITIAL W/PTA/IMG LEFT  09/11/2018  ? IR AV DIALY SHUNT INTRO NEEDLE/INTRACATH INITIAL W/PTA/IMG RIGHT Right 04/21/2020  ? IR AV DIALY SHUNT INTRO NEEDLE/INTRACATH INITIAL W/PTA/IMG RIGHT Right 10/08/2020  ? IR DIALY SHUNT INTRO NEEDLE/INTRACATH INITIAL W/IMG LEFT Left 05/08/2018  ? IR DIALY SHUNT INTRO NEEDLE/INTRACATH INITIAL W/IMG LEFT Left 06/17/2021  ? IR FLUORO GUIDE CV LINE RIGHT  02/01/2019  ? IR FLUORO GUIDE CV LINE RIGHT  07/14/2020  ? IR FLUORO GUIDE CV LINE RIGHT   12/29/2020  ? IR GENERIC HISTORICAL  08/01/2016  ? IR FLUORO GUIDE CV LINE RIGHT 08/01/2016 [name], MD [facility]  ? IR GENERIC HISTORICAL  08/01/2016  ? IR US GUIDE VASC ACCESS RIGHT 08/01/2016 [name], MD [facility]  ? IR REMOVAL TUN CV CATH W/O FL  11/01/2016  ? IR REMOVAL TUN CV CATH W/O FL  01/02/2020  ? IR REMOVAL TUN CV CATH W/O FL  05/04/2021  ? IR THROMBECTOMY AV FISTULA W/THROMBOLYSIS INC/SHUNT/IMG LEFT Left 01/31/2019  ? IR THROMBECTOMY AV FISTULA W/THROMBOLYSIS/PTA INC/SHUNT/IMG LEFT Left 01/27/2019  ? IR THROMBECTOMY AV FISTULA W/THROMBOLYSIS/PTA INC/SHUNT/IMG LEFT Left 07/16/2020  ? IR THROMBECTOMY AV FISTULA W/THROMBOLYSIS/PTA/STENT INC/SHUNT/IMG RT Right 09/24/2020  ? IR US GUIDE VASC ACCESS LEFT  11/10/2016  ? IR US GUIDE VASC ACCESS LEFT  09/12/2017  ? IR US GUIDE VASC ACCESS LEFT  12/12/2017  ? IR US GUIDE VASC ACCESS LEFT  06/05/2018  ? IR US GUIDE VASC ACCESS LEFT  09/11/2018  ? IR US GUIDE VASC ACCESS LEFT  01/27/2019  ? IR US GUIDE VASC ACCESS LEFT  02/01/2019  ? IR US GUIDE VASC ACCESS RIGHT  02/01/2019  ? IR US GUIDE VASC ACCESS RIGHT  07/14/2020  ? IR US GUIDE VASC ACCESS RIGHT  07/16/2020  ? IR US GUIDE VASC ACCESS RIGHT  10/08/2020  ? IR US GUIDE VASC ACCESS RIGHT  12/29/2020  ? PERIPHERAL VASCULAR BALLOON ANGIOPLASTY Left 07/22/2021  ? Procedure: PERIPHERAL VASCULAR BALLOON ANGIOPLASTY;  Surgeon: Cherre Robins, MD;  Location: [facility];  Service: Cardiovascular;  Laterality: Left;  ? UPPER EXTREMITY VENOGRAPHY Bilateral 02/28/2021  ? Procedure: UPPER EXTREMITY VENOGRAPHY;  Surgeon: Waynetta Sandy, MD;  Location: [facility];  Service: Cardiovascular;  Laterality: Bilateral;  ? ? ?History reviewed. No pertinent family history. ? ?Social History  ? ?Socioeconomic History  ? Marital status: Widowed  ?  Spouse name: Not on file  ? Number of children: 11  ? Years of education: no schooling  ? Highest education level: Not on file  ?Occupational History  ? Not on file  ?Tobacco Use   ? Smoking status: Never  ? Smokeless tobacco: Never  ?Vaping Use  ? Vaping Use: Never used  ?Substance and Sexual Activity  ? Alcohol use: Not Currently  ?  Alcohol/week: 0.0 standard drinks  ?  Comment: rare- a beer every now and then  ? Drug use: No  ? Sexual activity: Not Currently  ?Other Topics Concern  ? Not on file  ?Social History Narrative  ? Lives with son [name]   ? Right handed  ? Caffeine: every once in a while a little coffee, chamomile tea, or soda  ? ?Social Determinants of Health  ? ?Financial Resource Strain: Not on file  ?Food Insecurity: Not on file  ?Transportation Needs: Not on file  ?Physical Activity: Not on file  ?Stress: Not on file  ?Social Connections: Not on file  ? ? ?No  Known Allergies ? ?Outpatient Medications Prior to Visit  ?Medication Sig Dispense Refill  ? amLODipine (NORVASC) 2.5 MG tablet Take 1 tablet (2.5 mg total) by mouth daily. (Patient not taking: Reported on 07/20/2021) 30 tablet 4  ? Blood Glucose Monitoring Suppl (TRUE METRIX METER) DEVI 1 each by Does not apply route 3 (three) times daily before meals. 1 Device 0  ? cetirizine (ZYRTEC) 10 MG tablet Take 1 tablet (10 mg total) by mouth daily. 30 tablet 1  ? cinacalcet (SENSIPAR) 30 MG tablet Take 1 tablet by mouth once a day (Patient not taking: Reported on 07/20/2021) 90 tablet 3  ? ethyl chloride spray SPRAY 1 SPRAY TO THE SKIN 3 TIMES A WEEK AS NEEDED. SPRAY A SMALL AMOUNT JUST PRIOR TO NEEDLE INSERTION. 116 mL 3  ? gabapentin (NEURONTIN) 300 MG capsule TAKE 1 CAPSULE (300 MG TOTAL) BY MOUTH AT BEDTIME. 30 capsule 2  ? glucose blood (TRUE METRIX BLOOD GLUCOSE TEST) test strip Use 3 times daily before meals 100 each 3  ? Insulin Syringe-Needle U-100 (TRUEPLUS INSULIN SYRINGE) 31G X 5/16" 1 ML MISC USE AS DIRECTED 100 each 5  ? lisinopril (ZESTRIL) 20 MG tablet TAKE 1 TABLET (20 MG TOTAL) BY MOUTH DAILY. 30 tablet 2  ? Nutritional Supplements (FEEDING SUPPLEMENT, NEPRO CARB STEADY,) LIQD Take 237 mLs by mouth See admin  instructions. Take 1 can (237 ml) by mouth once daily on Tuesdays, Thursdays & Saturday morning. May take an additional can (237 ml) by mouth if needed for poor appetite.    ? omeprazole (PRILOSEC) 20 MG

## 2021-11-18 ENCOUNTER — Other Ambulatory Visit: Payer: Self-pay

## 2021-11-24 ENCOUNTER — Other Ambulatory Visit: Payer: Self-pay

## 2021-11-30 ENCOUNTER — Other Ambulatory Visit: Payer: Self-pay

## 2021-12-14 ENCOUNTER — Other Ambulatory Visit: Payer: Self-pay

## 2021-12-20 ENCOUNTER — Other Ambulatory Visit (HOSPITAL_COMMUNITY): Payer: Self-pay | Admitting: Nephrology

## 2021-12-20 DIAGNOSIS — N186 End stage renal disease: Secondary | ICD-10-CM

## 2021-12-23 ENCOUNTER — Other Ambulatory Visit: Payer: Self-pay | Admitting: Family Medicine

## 2021-12-23 ENCOUNTER — Other Ambulatory Visit (HOSPITAL_COMMUNITY): Payer: Self-pay | Admitting: Student

## 2021-12-23 DIAGNOSIS — E1149 Type 2 diabetes mellitus with other diabetic neurological complication: Secondary | ICD-10-CM

## 2021-12-23 DIAGNOSIS — I770 Arteriovenous fistula, acquired: Secondary | ICD-10-CM

## 2021-12-23 NOTE — Telephone Encounter (Signed)
Requested medication (s) are due for refill today: yes  Requested medication (s) are on the active medication list: yes  Last refill:  09/16/21- 09/16/22 #30 2 refills  Future visit scheduled: yes in 4 months  Notes to clinic:  do you want to continue refills?     Requested Prescriptions  Pending Prescriptions Disp Refills   gabapentin (NEURONTIN) 300 MG capsule 30 capsule 2    Sig: TAKE 1 CAPSULE (300 MG TOTAL) BY MOUTH AT BEDTIME.     Neurology: Anticonvulsants - gabapentin Failed - 12/23/2021  5:22 AM      Failed - Cr in normal range and within 360 days    Creat  Date Value Ref Range Status  01/13/2016 3.97 (H) 0.50 - 0.99 mg/dL Final    Comment:      For patients > or = 73 years of age: The upper reference limit for Creatinine is approximately 13% higher for people identified as African-American.      Creatinine, Ser  Date Value Ref Range Status  07/22/2021 5.00 (H) 0.44 - 1.00 mg/dL Final   Creatinine, Urine  Date Value Ref Range Status  01/13/2016 95 20 - 320 mg/dL Final         Failed - Completed PHQ-2 or PHQ-9 in the last 360 days      Passed - Valid encounter within last 12 months    Recent Outpatient Visits           1 month ago Type 2 diabetes mellitus with chronic kidney disease on chronic dialysis, with long-term current use of insulin (HCC)   [facility] [place], [place], MD   6 months ago Type 2 diabetes mellitus with chronic kidney disease on chronic dialysis, with long-term current use of insulin (HCC)   [facility] [place], Gavin Pound B, MD   10 months ago Type 2 diabetes mellitus with chronic kidney disease on chronic dialysis, with long-term current use of insulin (HCC)   [facility] Hoy Register, MD   1 year ago Muscle cramp   [facility] [place], Odette Horns, MD   1 year ago Type 2 diabetes mellitus with chronic kidney disease on  chronic dialysis, with long-term current use of insulin (HCC)   [facility] [place], Kasandra Knudsen, PA-C       Future Appointments             In 4 months Hoy Register, MD [facility]

## 2021-12-26 ENCOUNTER — Other Ambulatory Visit: Payer: Self-pay

## 2021-12-26 ENCOUNTER — Other Ambulatory Visit (HOSPITAL_COMMUNITY): Payer: Self-pay | Admitting: Nephrology

## 2021-12-26 ENCOUNTER — Ambulatory Visit (HOSPITAL_COMMUNITY)
Admission: RE | Admit: 2021-12-26 | Discharge: 2021-12-26 | Disposition: A | Discharge status: Home / Self Care | Payer: Self-pay | Source: Ambulatory Visit | Attending: Nephrology | Admitting: Nephrology

## 2021-12-26 ENCOUNTER — Encounter (HOSPITAL_COMMUNITY): Payer: Self-pay

## 2021-12-26 DIAGNOSIS — Z794 Long term (current) use of insulin: Secondary | ICD-10-CM | POA: Insufficient documentation

## 2021-12-26 DIAGNOSIS — E785 Hyperlipidemia, unspecified: Secondary | ICD-10-CM | POA: Insufficient documentation

## 2021-12-26 DIAGNOSIS — T82858A Stenosis of vascular prosthetic devices, implants and grafts, initial encounter: Secondary | ICD-10-CM | POA: Insufficient documentation

## 2021-12-26 DIAGNOSIS — Z992 Dependence on renal dialysis: Secondary | ICD-10-CM | POA: Insufficient documentation

## 2021-12-26 DIAGNOSIS — N186 End stage renal disease: Secondary | ICD-10-CM | POA: Insufficient documentation

## 2021-12-26 DIAGNOSIS — Y841 Kidney dialysis as the cause of abnormal reaction of the patient, or of later complication, without mention of misadventure at the time of the procedure: Secondary | ICD-10-CM | POA: Insufficient documentation

## 2021-12-26 DIAGNOSIS — I770 Arteriovenous fistula, acquired: Secondary | ICD-10-CM

## 2021-12-26 DIAGNOSIS — I120 Hypertensive chronic kidney disease with stage 5 chronic kidney disease or end stage renal disease: Secondary | ICD-10-CM | POA: Insufficient documentation

## 2021-12-26 DIAGNOSIS — E1122 Type 2 diabetes mellitus with diabetic chronic kidney disease: Secondary | ICD-10-CM | POA: Insufficient documentation

## 2021-12-26 HISTORY — PX: IR AV DIALY SHUNT INTRO NEEDLE/INTRACATH INITIAL W/PTA/IMG LEFT: IMG6103

## 2021-12-26 HISTORY — PX: IR US GUIDE VASC ACCESS LEFT: IMG2389

## 2021-12-26 LAB — GLUCOSE, CAPILLARY
Glucose-Capillary: 144 mg/dL — ABNORMAL HIGH (ref 70–99)
Glucose-Capillary: 120 mg/dL — ABNORMAL HIGH (ref 70–99)

## 2021-12-26 MED ORDER — FENTANYL CITRATE (PF) 100 MCG/2ML IJ SOLN
INTRAMUSCULAR | Status: AC
  Filled 2021-12-26: qty 2

## 2021-12-26 MED ORDER — MIDAZOLAM HCL 2 MG/2ML IJ SOLN
INTRAMUSCULAR | Status: AC
  Filled 2021-12-26: qty 4

## 2021-12-26 MED ORDER — GABAPENTIN 300 MG PO CAPS
ORAL_CAPSULE | Freq: Every day | ORAL | 2 refills | Status: DC
  Filled 2021-12-26: qty 30, 30d supply, fill #0
  Filled 2022-01-26: qty 30, 30d supply, fill #1

## 2021-12-26 MED ORDER — IOHEXOL 300 MG/ML  SOLN
100.0000 mL | Freq: Once | INTRAMUSCULAR | Status: AC | PRN
  Administered 2021-12-26: 50 mL via INTRAVENOUS

## 2021-12-26 MED ORDER — SODIUM CHLORIDE 0.9 % IV SOLN: INTRAVENOUS | Status: DC

## 2021-12-26 MED ORDER — FENTANYL CITRATE (PF) 100 MCG/2ML IJ SOLN
INTRAMUSCULAR | Status: AC | PRN
  Administered 2021-12-26: 50 ug via INTRAVENOUS

## 2021-12-26 MED ORDER — MIDAZOLAM HCL 2 MG/2ML IJ SOLN
INTRAMUSCULAR | Status: AC | PRN
  Administered 2021-12-26 (×2): .5 mg via INTRAVENOUS

## 2021-12-26 MED ORDER — HEPARIN SODIUM (PORCINE) 1000 UNIT/ML IJ SOLN
INTRAMUSCULAR | Status: AC
  Filled 2021-12-26: qty 10

## 2021-12-26 MED ORDER — SODIUM CHLORIDE 0.9 % IV SOLN
INTRAVENOUS | Status: AC | PRN
  Administered 2021-12-26: 10 mL/h via INTRAVENOUS

## 2021-12-26 MED ORDER — LIDOCAINE HCL 1 % IJ SOLN
INTRAMUSCULAR | Status: AC
  Filled 2021-12-26: qty 20

## 2021-12-26 MED ORDER — HEPARIN SODIUM (PORCINE) 1000 UNIT/ML IJ SOLN
INTRAMUSCULAR | Status: AC | PRN
  Administered 2021-12-26: 3000 [IU] via INTRAVENOUS

## 2021-12-26 NOTE — Sedation Documentation (Signed)
 Interpreter Present during case

## 2021-12-26 NOTE — Sedation Documentation (Signed)
 Patient is resting comfortably.

## 2021-12-26 NOTE — Progress Notes (Signed)
Through spanish interpreter-Pt's son/primary caretaker states pt sleeps most of the time/hx of dementia and is at her baseline-he redressed pt and assisted her to w/c for d/c

## 2021-12-29 ENCOUNTER — Other Ambulatory Visit: Payer: Self-pay | Admitting: Family Medicine

## 2021-12-29 ENCOUNTER — Other Ambulatory Visit: Payer: Self-pay

## 2021-12-29 DIAGNOSIS — I10 Essential (primary) hypertension: Secondary | ICD-10-CM

## 2021-12-29 MED ORDER — LISINOPRIL 20 MG PO TABS
ORAL_TABLET | Freq: Every day | ORAL | 2 refills | Status: DC
  Filled 2021-12-29: qty 30, 30d supply, fill #0
  Filled 2022-01-26: qty 30, 30d supply, fill #1

## 2021-12-30 ENCOUNTER — Other Ambulatory Visit: Payer: Self-pay

## 2022-01-13 ENCOUNTER — Other Ambulatory Visit: Payer: Self-pay

## 2022-01-26 ENCOUNTER — Other Ambulatory Visit: Payer: Self-pay

## 2022-01-27 ENCOUNTER — Other Ambulatory Visit: Payer: Self-pay

## 2022-02-02 ENCOUNTER — Other Ambulatory Visit: Payer: Self-pay

## 2022-02-06 ENCOUNTER — Ambulatory Visit: Payer: Self-pay

## 2022-02-06 NOTE — Telephone Encounter (Signed)
   Chief Complaint: Vomiting 4-5 days, weak, dialysis pt. Symptoms: Above, mouth is dry Frequency: 4-5 days ago Pertinent Negatives: Patient denies  Disposition: [x] ED /[] Urgent Care (no appt availability in office) / [] Appointment(In office/virtual)/ []  [facility]/ [] Home Care/ [] Refused Recommended Disposition /[] [facility]/ []  Follow-up with PCP Additional Notes: Call 911 if pt. Too weak to get in car.  Reason for Disposition  Patient sounds very sick or weak to the triager  Answer Assessment - Initial Assessment Questions 1. VOMITING SEVERITY: "How many times have you vomited in the past 24 hours?"     - MILD:  1 - 2 times/day    - MODERATE: 3 - 5 times/day, decreased oral intake without significant weight loss or symptoms of dehydration    - SEVERE: 6 or more times/day, vomits everything or nearly everything, with significant weight loss, symptoms of dehydration      1-2 2. ONSET: "When did the vomiting begin?"      4-5 days ago 3. FLUIDS: "What fluids or food have you vomited up today?" "Have you been able to keep any fluids down?"     Yes 4. ABDOMEN PAIN: "Are your having any abdomen pain?" If Yes : "How bad is it and what does it feel like?" (e.g., crampy, dull, intermittent, constant)      Unsure 5. DIARRHEA: "Is there any diarrhea?" If Yes, ask: "How many times today?"      Yes 6. CONTACTS: "Is there anyone else in the family with the same symptoms?"      No 7. CAUSE: "What do you think is causing your vomiting?"     Unsure 8. HYDRATION STATUS: "Any signs of dehydration?" (e.g., dry mouth [not only dry lips], too weak to stand) "When did you last urinate?"     Weak, dry 9. OTHER SYMPTOMS: "Do you have any other symptoms?" (e.g., fever, headache, vertigo, vomiting blood or coffee grounds, recent head injury)     No 10. PREGNANCY: "Is there any chance you are pregnant?" "When was your last menstrual period?"       No  Protocols used:  Vomiting-A-AH

## 2022-02-09 ENCOUNTER — Other Ambulatory Visit: Payer: Self-pay | Admitting: Family Medicine

## 2022-02-10 ENCOUNTER — Other Ambulatory Visit: Payer: Self-pay

## 2022-02-10 MED ORDER — ETHYL CHLORIDE EX AERO
INHALATION_SPRAY | CUTANEOUS | 2 refills | Status: DC
  Filled 2022-02-10: qty 116, 30d supply, fill #0

## 2022-02-10 NOTE — Telephone Encounter (Signed)
Requested medications are due for refill today.  unsure  Requested medications are on the active medications list.  yes  Last refill. 07/04/2021 1102mL 3 refills  Future visit scheduled.   yes  Notes to clinic.  Medication not assigned a protocol.    Requested Prescriptions  Pending Prescriptions Disp Refills   ethyl chloride spray 116 mL 3    Sig: SPRAY 1 SPRAY TO THE SKIN 3 TIMES A WEEK AS NEEDED. SPRAY A SMALL AMOUNT JUST PRIOR TO NEEDLE INSERTION.     Off-Protocol Failed - 02/09/2022 11:22 PM      Failed - Medication not assigned to a protocol, review manually.      Passed - Valid encounter within last 12 months    Recent Outpatient Visits           2 months ago Type 2 diabetes mellitus with chronic kidney disease on chronic dialysis, with long-term current use of insulin ([place])   [place], [place], MD   8 months ago Type 2 diabetes mellitus with chronic kidney disease on chronic dialysis, with long-term current use of insulin ([place])   [place], Neoma Laming B, MD   12 months ago Type 2 diabetes mellitus with chronic kidney disease on chronic dialysis, with long-term current use of insulin ([place])   [facility] [name], MD   1 year ago Muscle cramp   [place], Charlane Ferretti, MD   1 year ago Type 2 diabetes mellitus with chronic kidney disease on chronic dialysis, with long-term current use of insulin ([place])   [place], Loraine Grip, PA-C       Future Appointments             In 3 months [name], MD [place]

## 2022-02-13 ENCOUNTER — Other Ambulatory Visit: Payer: Self-pay

## 2022-02-14 ENCOUNTER — Other Ambulatory Visit: Payer: Self-pay | Admitting: Family Medicine

## 2022-02-14 DIAGNOSIS — E119 Type 2 diabetes mellitus without complications: Secondary | ICD-10-CM

## 2022-02-15 ENCOUNTER — Other Ambulatory Visit: Payer: Self-pay

## 2022-02-15 MED ORDER — TRUE METRIX BLOOD GLUCOSE TEST VI STRP
ORAL_STRIP | 3 refills | Status: DC
  Filled 2022-02-15: qty 100, 30d supply, fill #0

## 2022-02-18 ENCOUNTER — Encounter (HOSPITAL_COMMUNITY): Payer: Self-pay

## 2022-02-18 ENCOUNTER — Emergency Department (HOSPITAL_COMMUNITY): Payer: Medicaid Other

## 2022-02-18 ENCOUNTER — Inpatient Hospital Stay (HOSPITAL_COMMUNITY)
Admission: EM | Admit: 2022-02-18 | Discharge: 2022-03-09 | DRG: 391 | Disposition: A | Discharge status: Home Health | Payer: Medicaid Other | Attending: Internal Medicine | Admitting: Internal Medicine

## 2022-02-18 ENCOUNTER — Other Ambulatory Visit: Payer: Self-pay

## 2022-02-18 DIAGNOSIS — E11319 Type 2 diabetes mellitus with unspecified diabetic retinopathy without macular edema: Secondary | ICD-10-CM | POA: Diagnosis present

## 2022-02-18 DIAGNOSIS — Z515 Encounter for palliative care: Secondary | ICD-10-CM

## 2022-02-18 DIAGNOSIS — F419 Anxiety disorder, unspecified: Secondary | ICD-10-CM | POA: Diagnosis present

## 2022-02-18 DIAGNOSIS — R1319 Other dysphagia: Secondary | ICD-10-CM | POA: Diagnosis not present

## 2022-02-18 DIAGNOSIS — K529 Noninfective gastroenteritis and colitis, unspecified: Secondary | ICD-10-CM

## 2022-02-18 DIAGNOSIS — E785 Hyperlipidemia, unspecified: Secondary | ICD-10-CM | POA: Diagnosis present

## 2022-02-18 DIAGNOSIS — R1310 Dysphagia, unspecified: Secondary | ICD-10-CM

## 2022-02-18 DIAGNOSIS — Z8614 Personal history of Methicillin resistant Staphylococcus aureus infection: Secondary | ICD-10-CM

## 2022-02-18 DIAGNOSIS — N2581 Secondary hyperparathyroidism of renal origin: Secondary | ICD-10-CM | POA: Diagnosis present

## 2022-02-18 DIAGNOSIS — Z7189 Other specified counseling: Secondary | ICD-10-CM

## 2022-02-18 DIAGNOSIS — R778 Other specified abnormalities of plasma proteins: Secondary | ICD-10-CM | POA: Diagnosis not present

## 2022-02-18 DIAGNOSIS — E1140 Type 2 diabetes mellitus with diabetic neuropathy, unspecified: Secondary | ICD-10-CM | POA: Diagnosis present

## 2022-02-18 DIAGNOSIS — Z794 Long term (current) use of insulin: Secondary | ICD-10-CM

## 2022-02-18 DIAGNOSIS — I953 Hypotension of hemodialysis: Secondary | ICD-10-CM | POA: Diagnosis not present

## 2022-02-18 DIAGNOSIS — I4510 Unspecified right bundle-branch block: Secondary | ICD-10-CM | POA: Diagnosis present

## 2022-02-18 DIAGNOSIS — I10 Essential (primary) hypertension: Secondary | ICD-10-CM

## 2022-02-18 DIAGNOSIS — Z6822 Body mass index (BMI) 22.0-22.9, adult: Secondary | ICD-10-CM

## 2022-02-18 DIAGNOSIS — R64 Cachexia: Secondary | ICD-10-CM | POA: Diagnosis present

## 2022-02-18 DIAGNOSIS — J811 Chronic pulmonary edema: Secondary | ICD-10-CM | POA: Diagnosis present

## 2022-02-18 DIAGNOSIS — E11649 Type 2 diabetes mellitus with hypoglycemia without coma: Secondary | ICD-10-CM | POA: Diagnosis not present

## 2022-02-18 DIAGNOSIS — M549 Dorsalgia, unspecified: Secondary | ICD-10-CM | POA: Diagnosis not present

## 2022-02-18 DIAGNOSIS — M79606 Pain in leg, unspecified: Secondary | ICD-10-CM | POA: Diagnosis not present

## 2022-02-18 DIAGNOSIS — F03C3 Unspecified dementia, severe, with mood disturbance: Secondary | ICD-10-CM | POA: Diagnosis present

## 2022-02-18 DIAGNOSIS — N250 Renal osteodystrophy: Secondary | ICD-10-CM | POA: Diagnosis present

## 2022-02-18 DIAGNOSIS — I248 Other forms of acute ischemic heart disease: Secondary | ICD-10-CM | POA: Diagnosis present

## 2022-02-18 DIAGNOSIS — N186 End stage renal disease: Secondary | ICD-10-CM | POA: Diagnosis not present

## 2022-02-18 DIAGNOSIS — E1122 Type 2 diabetes mellitus with diabetic chronic kidney disease: Secondary | ICD-10-CM | POA: Diagnosis present

## 2022-02-18 DIAGNOSIS — A09 Infectious gastroenteritis and colitis, unspecified: Principal | ICD-10-CM | POA: Diagnosis present

## 2022-02-18 DIAGNOSIS — I120 Hypertensive chronic kidney disease with stage 5 chronic kidney disease or end stage renal disease: Secondary | ICD-10-CM | POA: Diagnosis present

## 2022-02-18 DIAGNOSIS — F0390 Unspecified dementia without behavioral disturbance: Secondary | ICD-10-CM | POA: Diagnosis present

## 2022-02-18 DIAGNOSIS — R52 Pain, unspecified: Secondary | ICD-10-CM

## 2022-02-18 DIAGNOSIS — Z79899 Other long term (current) drug therapy: Secondary | ICD-10-CM

## 2022-02-18 DIAGNOSIS — K2289 Other specified disease of esophagus: Secondary | ICD-10-CM | POA: Diagnosis present

## 2022-02-18 DIAGNOSIS — R7989 Other specified abnormal findings of blood chemistry: Secondary | ICD-10-CM

## 2022-02-18 DIAGNOSIS — Z66 Do not resuscitate: Secondary | ICD-10-CM | POA: Diagnosis not present

## 2022-02-18 DIAGNOSIS — E876 Hypokalemia: Secondary | ICD-10-CM | POA: Diagnosis present

## 2022-02-18 DIAGNOSIS — K224 Dyskinesia of esophagus: Secondary | ICD-10-CM | POA: Diagnosis present

## 2022-02-18 DIAGNOSIS — D631 Anemia in chronic kidney disease: Secondary | ICD-10-CM | POA: Diagnosis present

## 2022-02-18 DIAGNOSIS — Z992 Dependence on renal dialysis: Secondary | ICD-10-CM

## 2022-02-18 DIAGNOSIS — E440 Moderate protein-calorie malnutrition: Secondary | ICD-10-CM | POA: Diagnosis present

## 2022-02-18 DIAGNOSIS — E039 Hypothyroidism, unspecified: Secondary | ICD-10-CM | POA: Diagnosis present

## 2022-02-18 DIAGNOSIS — J90 Pleural effusion, not elsewhere classified: Secondary | ICD-10-CM | POA: Diagnosis present

## 2022-02-18 DIAGNOSIS — F05 Delirium due to known physiological condition: Secondary | ICD-10-CM | POA: Diagnosis present

## 2022-02-18 DIAGNOSIS — R627 Adult failure to thrive: Secondary | ICD-10-CM | POA: Diagnosis present

## 2022-02-18 LAB — BASIC METABOLIC PANEL
Anion gap: 13 (ref 5–15)
BUN: 21 mg/dL (ref 8–23)
CO2: 29 mmol/L (ref 22–32)
Calcium: 9.4 mg/dL (ref 8.9–10.3)
Chloride: 94 mmol/L — ABNORMAL LOW (ref 98–111)
Creatinine, Ser: 3.22 mg/dL — ABNORMAL HIGH (ref 0.44–1.00)
GFR, Estimated: 15 mL/min — ABNORMAL LOW (ref >60)
Glucose, Bld: 275 mg/dL — ABNORMAL HIGH (ref 70–99)
Potassium: 3.5 mmol/L (ref 3.5–5.1)
Sodium: 136 mmol/L (ref 135–145)

## 2022-02-18 LAB — LIPASE, BLOOD: Lipase: 51 U/L (ref 11–51)

## 2022-02-18 LAB — CBC
HCT: 39.5 % (ref 36.0–46.0)
Hemoglobin: 13.1 g/dL (ref 12.0–15.0)
MCH: 33 pg (ref 26.0–34.0)
MCHC: 33.2 g/dL (ref 30.0–36.0)
MCV: 99.5 fL (ref 80.0–100.0)
Platelets: 221 10*3/uL (ref 150–400)
RBC: 3.97 MIL/uL (ref 3.87–5.11)
RDW: 14.6 % (ref 11.5–15.5)
WBC: 11 10*3/uL — ABNORMAL HIGH (ref 4.0–10.5)
nRBC: 0 % (ref 0.0–0.2)

## 2022-02-18 LAB — MAGNESIUM: Magnesium: 2.1 mg/dL (ref 1.7–2.4)

## 2022-02-18 LAB — HEPATIC FUNCTION PANEL
ALT: 28 U/L (ref 0–44)
AST: 46 U/L — ABNORMAL HIGH (ref 15–41)
Albumin: 2.7 g/dL — ABNORMAL LOW (ref 3.5–5.0)
Alkaline Phosphatase: 166 U/L — ABNORMAL HIGH (ref 38–126)
Bilirubin, Direct: 0.3 mg/dL — ABNORMAL HIGH (ref 0.0–0.2)
Indirect Bilirubin: 0.5 mg/dL (ref 0.3–0.9)
Total Bilirubin: 0.8 mg/dL (ref 0.3–1.2)
Total Protein: 7.1 g/dL (ref 6.5–8.1)

## 2022-02-18 LAB — TROPONIN I (HIGH SENSITIVITY): Troponin I (High Sensitivity): 374 ng/L (ref <18)

## 2022-02-18 LAB — C DIFFICILE QUICK SCREEN W PCR REFLEX
C Diff antigen: NEGATIVE
C Diff interpretation: NOT DETECTED
C Diff toxin: NEGATIVE

## 2022-02-18 LAB — TSH: TSH: 6.264 u[IU]/mL — ABNORMAL HIGH (ref 0.350–4.500)

## 2022-02-18 LAB — CBG MONITORING, ED: Glucose-Capillary: 204 mg/dL — ABNORMAL HIGH (ref 70–99)

## 2022-02-18 MED ORDER — LOPERAMIDE HCL 2 MG PO CAPS
2.0000 mg | ORAL_CAPSULE | ORAL | Status: DC | PRN
  Administered 2022-02-20 – 2022-02-21 (×2): 2 mg via ORAL
  Filled 2022-02-18 (×3): qty 1

## 2022-02-18 MED ORDER — METRONIDAZOLE 500 MG/100ML IV SOLN
500.0000 mg | Freq: Two times a day (BID) | INTRAVENOUS | Status: DC
  Administered 2022-02-18 – 2022-02-28 (×19): 500 mg via INTRAVENOUS
  Filled 2022-02-18 (×19): qty 100

## 2022-02-18 MED ORDER — ACETAMINOPHEN 650 MG RE SUPP
650.0000 mg | Freq: Four times a day (QID) | RECTAL | Status: DC | PRN
  Administered 2022-02-23: 650 mg via RECTAL
  Filled 2022-02-18: qty 1

## 2022-02-18 MED ORDER — SODIUM CHLORIDE 0.9 % IV SOLN
2.0000 g | INTRAVENOUS | Status: DC
  Administered 2022-02-18 – 2022-02-26 (×8): 2 g via INTRAVENOUS
  Filled 2022-02-18 (×9): qty 20

## 2022-02-18 MED ORDER — ONDANSETRON HCL 4 MG/2ML IJ SOLN
4.0000 mg | Freq: Four times a day (QID) | INTRAMUSCULAR | Status: DC | PRN
  Administered 2022-02-19 – 2022-02-26 (×5): 4 mg via INTRAVENOUS
  Filled 2022-02-18 (×6): qty 2

## 2022-02-18 MED ORDER — ASPIRIN 325 MG PO TABS: 325.0000 mg | ORAL_TABLET | Freq: Every day | ORAL | Status: DC

## 2022-02-18 MED ORDER — ASPIRIN 81 MG PO CHEW
324.0000 mg | CHEWABLE_TABLET | Freq: Once | ORAL | Status: AC
  Administered 2022-02-18: 324 mg via ORAL
  Filled 2022-02-18: qty 4

## 2022-02-18 MED ORDER — ACETAMINOPHEN 325 MG PO TABS
650.0000 mg | ORAL_TABLET | Freq: Four times a day (QID) | ORAL | Status: DC | PRN
  Administered 2022-02-20 – 2022-03-05 (×8): 650 mg via ORAL
  Filled 2022-02-18 (×10): qty 2

## 2022-02-18 MED ORDER — LACTATED RINGERS IV BOLUS
1000.0000 mL | Freq: Once | INTRAVENOUS | Status: AC
  Administered 2022-02-18: 1000 mL via INTRAVENOUS

## 2022-02-18 MED ORDER — INSULIN ASPART 100 UNIT/ML IJ SOLN
0.0000 [IU] | INTRAMUSCULAR | Status: DC
  Administered 2022-02-18: 2 [IU] via SUBCUTANEOUS

## 2022-02-18 MED ORDER — HEPARIN SODIUM (PORCINE) 5000 UNIT/ML IJ SOLN
5000.0000 [IU] | Freq: Three times a day (TID) | INTRAMUSCULAR | Status: DC
  Administered 2022-02-18 – 2022-03-09 (×53): 5000 [IU] via SUBCUTANEOUS
  Filled 2022-02-18 (×53): qty 1

## 2022-02-18 MED ORDER — ONDANSETRON HCL 4 MG PO TABS: 4.0000 mg | ORAL_TABLET | Freq: Four times a day (QID) | ORAL | Status: DC | PRN

## 2022-02-18 MED ORDER — IOHEXOL 300 MG/ML  SOLN
75.0000 mL | Freq: Once | INTRAMUSCULAR | Status: AC | PRN
  Administered 2022-02-18: 75 mL via INTRAVENOUS

## 2022-02-18 MED ORDER — LACTATED RINGERS IV BOLUS: 250.0000 mL | Freq: Once | INTRAVENOUS | Status: DC

## 2022-02-18 NOTE — ED Provider Notes (Signed)
  Physical Exam  BP (!) 98/29   Pulse 77   Temp 98 F (36.7 C) (Oral)   Resp (!) 22   LMP  (LMP Unknown)   SpO2 95%   Physical Exam Vitals and nursing note reviewed.  Constitutional:      General: She is not in acute distress.    Appearance: She is well-developed.  HENT:     Head: Normocephalic and atraumatic.  Eyes:     Conjunctiva/sclera: Conjunctivae normal.  Cardiovascular:     Rate and Rhythm: Normal rate and regular rhythm.     Heart sounds: No murmur heard. Pulmonary:     Effort: Pulmonary effort is normal. No respiratory distress.     Breath sounds: Rales present.  Abdominal:     Palpations: Abdomen is soft.     Tenderness: There is no abdominal tenderness.  Musculoskeletal:        General: No swelling.     Cervical back: Neck supple.  Skin:    General: Skin is warm and dry.     Capillary Refill: Capillary refill takes less than 2 seconds.  Neurological:     Mental Status: She is alert.  Psychiatric:        Mood and Affect: Mood normal.     Procedures  Procedures  ED Course / MDM    Medical Decision Making Amount and/or Complexity of Data Reviewed Labs: ordered. Radiology: ordered.  Risk OTC drugs. Prescription drug management.   Received in handoff.  Presents with difficulty tolerating p.o.  Family concerned for overall failure to thrive and clinical decompensation.  Pending work-up at time of signout.

## 2022-02-18 NOTE — Consult Note (Signed)
Cardiology Consultation:   Patient ID: Sarah Kelley MRN: [id number]; DOB: January 06, 1951  Admit date: 02/18/2022 Date of Consult: 02/18/2022  PCP:  Sarah Rakes, MD   [facility] Providers Cardiologist:  None   { Click here to update MD or APP on Care Team, Refresh:1}     Patient Profile:   Sarah Kelley is a 71 y.o. female with a hx of ESRD on IHD, HTN, HLD, and IDDM2 who is being seen 02/18/2022 for the evaluation of elevated troponins at the request of Dr Matilde Sprang.  History of Present Illness:   The following history was obtained primarily from the patient's son with the help of an interpreter since she is Spanish-speaking only.  Sarah Kelley was brought in by family to the [facility] health ED due to poor p.o. intake and diaphoresis.  Per the patient's son, over the past 2 weeks the patient has had poor appetite and has appeared generally unwell, and has not been as communicative as normal.  Over the past 3 days she has had diaphoresis as well.  The only other complaint was that the patient has reported feeling like food gets stuck in her throat, but otherwise denies chest pain, SOB, syncope, nausea, vomiting, diarrhea, or swelling.  The patient's son helps her take all of her meds and gets her to dialysis which she had completed today.  The son had not seen his mom in a couple days but after hearing about her symptoms he brought her into the ED for evaluation.  In the ED her VS were afebrile, HR 78, BP 121/57, RR 14 and satting 100% on RA.  Labs notable for WBC 11, hemoglobin 13.1, creatinine 3.2, TSH 6.26 and troponin 374.  EKG showed Q waves in II, III, and aVF.  A CT C/A/P showed colonic wall thickening, bilateral pleural effusions with loculations, mild pulmonary edema, thick-walled urinary bladder, and patulous esophagus.  Given the elevated troponin cardiology was consulted for evaluation.   Past Medical History:  Diagnosis Date   Diabetes mellitus    ESRD  (end stage renal disease) ([place])    dialysis T/Th/Sa   GERD (gastroesophageal reflux disease)    Hyperlipidemia    Hypertension    Iron deficiency anemia    MRSA infection    hx of   Neuropathy    Diabetic   Pneumonia 07/2016   Renal disorder    Seizure-like activity ([place])    Sepsis ([place])    Thyroid disease    Wears dentures     Past Surgical History:  Procedure Laterality Date   AMPUTATION Left 05/21/2015   Procedure: Left Foot 3rd Ray Amputation;  Surgeon: Newt Minion, MD;  Location: [place];  Service: Orthopedics;  Laterality: Left;   AV FISTULA PLACEMENT Left 03/28/2016   Procedure: ARTERIOVENOUS (AV) FISTULA CREATION LEFT ARM;  Surgeon: Waynetta Sandy, MD;  Location: [place];  Service: Vascular;  Laterality: Left;   AV FISTULA PLACEMENT Right 06/04/2019   Procedure: ARTERIOVENOUS (AV) FISTULA CREATION RIGHT ARM;  Surgeon: Waynetta Sandy, MD;  Location: [place];  Service: Vascular;  Laterality: Right;   AV FISTULA PLACEMENT Left 03/11/2021   Procedure: INSERTION OF LEFT ARM ARTERIOVENOUS (AV) GORE-TEX GRAFT;  Surgeon: Waynetta Sandy, MD;  Location: [place];  Service: Vascular;  Laterality: Left;   DILATION AND CURETTAGE OF UTERUS     EYE SURGERY     FISTULA SUPERFICIALIZATION Left 09/04/2016   Procedure: FISTULA SUPERFICIALIZATION LEFT UPPER ARM;  Surgeon: Waynetta Sandy, MD;  [facility];  Service: Vascular;  Laterality: Left;   FISTULA SUPERFICIALIZATION Right 10/08/2019   Procedure: FISTULA SUPERFICIALIZATION OR RIGHT ARM;  Surgeon: Waynetta Sandy, MD;  Location: [place];  Service: Vascular;  Laterality: Right;   IR AV DIALY SHUNT INTRO NEEDLE/INTRAC INITIAL W/PTA/STENT/IMG LT Left 06/13/2017   IR AV DIALY SHUNT INTRO NEEDLE/INTRAC INITIAL W/PTA/STENT/IMG LT Left 09/12/2017   IR AV DIALY SHUNT INTRO NEEDLE/INTRACATH INITIAL W/PTA/IMG LEFT  11/10/2016   IR AV DIALY SHUNT INTRO NEEDLE/INTRACATH INITIAL W/PTA/IMG LEFT  03/09/2017   IR AV  DIALY SHUNT INTRO NEEDLE/INTRACATH INITIAL W/PTA/IMG LEFT  12/12/2017   IR AV DIALY SHUNT INTRO NEEDLE/INTRACATH INITIAL W/PTA/IMG LEFT  02/27/2018   IR AV DIALY SHUNT INTRO NEEDLE/INTRACATH INITIAL W/PTA/IMG LEFT  06/05/2018   IR AV DIALY SHUNT INTRO NEEDLE/INTRACATH INITIAL W/PTA/IMG LEFT  09/11/2018   IR AV DIALY SHUNT INTRO NEEDLE/INTRACATH INITIAL W/PTA/IMG LEFT  12/26/2021   IR AV DIALY SHUNT INTRO NEEDLE/INTRACATH INITIAL W/PTA/IMG RIGHT Right 04/21/2020   IR AV DIALY SHUNT INTRO NEEDLE/INTRACATH INITIAL W/PTA/IMG RIGHT Right 10/08/2020   IR DIALY SHUNT INTRO NEEDLE/INTRACATH INITIAL W/IMG LEFT Left 05/08/2018   IR DIALY SHUNT INTRO NEEDLE/INTRACATH INITIAL W/IMG LEFT Left 06/17/2021   IR FLUORO GUIDE CV LINE RIGHT  02/01/2019   IR FLUORO GUIDE CV LINE RIGHT  07/14/2020   IR FLUORO GUIDE CV LINE RIGHT  12/29/2020   IR GENERIC HISTORICAL  08/01/2016   IR FLUORO GUIDE CV LINE RIGHT 08/01/2016 [name], MD [facility]   IR GENERIC HISTORICAL  08/01/2016   IR US GUIDE VASC ACCESS RIGHT 08/01/2016 [name], MD [facility]   IR REMOVAL TUN CV CATH W/O FL  11/01/2016   IR REMOVAL TUN CV CATH W/O FL  01/02/2020   IR REMOVAL TUN CV CATH W/O FL  05/04/2021   IR THROMBECTOMY AV FISTULA W/THROMBOLYSIS INC/SHUNT/IMG LEFT Left 01/31/2019   IR THROMBECTOMY AV FISTULA W/THROMBOLYSIS/PTA INC/SHUNT/IMG LEFT Left 01/27/2019   IR THROMBECTOMY AV FISTULA W/THROMBOLYSIS/PTA INC/SHUNT/IMG LEFT Left 07/16/2020   IR THROMBECTOMY AV FISTULA W/THROMBOLYSIS/PTA/STENT INC/SHUNT/IMG RT Right 09/24/2020   IR US GUIDE VASC ACCESS LEFT  11/10/2016   IR US GUIDE VASC ACCESS LEFT  09/12/2017   IR US GUIDE VASC ACCESS LEFT  12/12/2017   IR US GUIDE VASC ACCESS LEFT  06/05/2018   IR US GUIDE VASC ACCESS LEFT  09/11/2018   IR US GUIDE VASC ACCESS LEFT  01/27/2019   IR US GUIDE VASC ACCESS LEFT  02/01/2019   IR US GUIDE VASC ACCESS LEFT  12/26/2021   IR US GUIDE VASC ACCESS RIGHT  02/01/2019   IR US GUIDE VASC ACCESS RIGHT  07/14/2020   IR US  GUIDE VASC ACCESS RIGHT  07/16/2020   IR US GUIDE VASC ACCESS RIGHT  10/08/2020   IR US GUIDE VASC ACCESS RIGHT  12/29/2020   PERIPHERAL VASCULAR BALLOON ANGIOPLASTY Left 07/22/2021   Procedure: PERIPHERAL VASCULAR BALLOON ANGIOPLASTY;  Surgeon: Cherre Robins, MD;  Location: [place] [facility];  Service: Cardiovascular;  Laterality: Left;   UPPER EXTREMITY VENOGRAPHY Bilateral 02/28/2021   Procedure: UPPER EXTREMITY VENOGRAPHY;  Surgeon: Waynetta Sandy, MD;  Location: [place] [facility];  Service: Cardiovascular;  Laterality: Bilateral;     Home Medications:  Prior to Admission medications   Medication Sig Start Date End Date Taking? Authorizing Provider  amLODipine (NORVASC) 2.5 MG tablet Take 1 tablet (2.5 mg total) by mouth daily. 06/10/21   Ladell Pier, MD  atorvastatin (LIPITOR) 40 MG  tablet TAKE 1 TABLET (40 MG TOTAL) BY MOUTH DAILY. (office visit for refills) 11/16/21 11/16/22  Sarah Rakes, MD  Blood Glucose Monitoring Suppl (TRUE METRIX METER) DEVI 1 each by Does not apply route 3 (three) times daily before meals. 07/25/17   Sarah Rakes, MD  cetirizine (ZYRTEC) 10 MG tablet Take 1 tablet (10 mg total) by mouth daily. 06/10/20   Mayers, Cari S, PA-C  cinacalcet (SENSIPAR) 30 MG tablet Take 1 tablet by mouth once a day Patient not taking: Reported on 07/20/2021 06/14/21     DULoxetine (CYMBALTA) 60 MG capsule TAKE 1 CAPSULE (60 MG TOTAL) BY MOUTH DAILY. 11/16/21 11/16/22  Sarah Rakes, MD  ethyl chloride spray SPRAY 1 SPRAY TO THE SKIN 3 TIMES A WEEK AS NEEDED. SPRAY A SMALL AMOUNT JUST PRIOR TO NEEDLE INSERTION. 02/10/22   Sarah Rakes, MD  gabapentin (NEURONTIN) 300 MG capsule TAKE 1 CAPSULE (300 MG TOTAL) BY MOUTH AT BEDTIME. 12/26/21 12/26/22  Sarah Rakes, MD  glucose blood (TRUE METRIX BLOOD GLUCOSE TEST) test strip Use 3 times daily before meals 02/15/22   Sarah Rakes, MD  insulin glargine (LANTUS) 100 UNIT/ML injection Inject subcutaneously twice daily 16  units in the morning and 8 units in the evening 11/16/21   Sarah Rakes, MD  Insulin Syringe-Needle U-100 (TRUEPLUS INSULIN SYRINGE) 31G X 5/16" 1 ML MISC USE AS DIRECTED 03/11/21   Sarah Rakes, MD  lisinopril (ZESTRIL) 20 MG tablet TAKE 1 TABLET (20 MG TOTAL) BY MOUTH DAILY. 12/29/21 12/29/22  Sarah Rakes, MD  Nutritional Supplements (FEEDING SUPPLEMENT, NEPRO CARB STEADY,) LIQD Take 237 mLs by mouth See admin instructions. Take 1 can (237 ml) by mouth once daily on Tuesdays, Thursdays & Saturday morning. May take an additional can (237 ml) by mouth if needed for poor appetite.    [provider]  omeprazole (PRILOSEC) 20 MG capsule TAKE 1 CAPSULE (20 MG TOTAL) BY MOUTH DAILY. Patient not taking: Reported on 07/20/2021 01/05/21 01/05/22  Mayers, Loraine Grip, PA-C  sucroferric oxyhydroxide (VELPHORO) 500 MG chewable tablet Chew 500 mg by mouth 3 (three) times daily with meals.    [provider]  TRUEplus Lancets 28G MISC Use as directed 3 (three) times daily. 02/14/21   Sarah Rakes, MD    Inpatient Medications: Scheduled Meds:  Continuous Infusions:  PRN Meds:   Allergies:   No Known Allergies  Social History:   Social History   Socioeconomic History   Marital status: Widowed    Spouse name: Not on file   Number of children: 11   Years of education: no schooling   Highest education level: Not on file  Occupational History   Not on file  Tobacco Use   Smoking status: Never   Smokeless tobacco: Never  Vaping Use   Vaping Use: Never used  Substance and Sexual Activity   Alcohol use: Not Currently    Alcohol/week: 0.0 standard drinks of alcohol    Comment: rare- a beer every now and then   Drug use: No   Sexual activity: Not Currently  Other Topics Concern   Not on file  Social History Narrative   Lives with son Marcial    Right handed   Caffeine: every once in a while a little coffee, chamomile tea, or soda   Social Determinants of Health   Financial  Resource Strain: Not on file  Food Insecurity: Not on file  Transportation Needs: Not on file  Physical Activity: Not on file  Stress: Not on file  Social Connections: Not on file  Intimate Partner Violence: Not on file    Family History:   No family history on file.   ROS:  Please see the history of present illness.  All other ROS reviewed and negative.     Physical Exam/Data:   Vitals:   02/18/22 2000 02/18/22 2030 02/18/22 2115 02/18/22 2145  BP: (!) 114/18  (!) 98/29 112/67  Pulse: 73 77  80  Resp: 16 18 (!) 22 15  Temp:  98 F (36.7 C)    TempSrc:  Oral    SpO2: 95% 95%  93%   No intake or output data in the 24 hours ending 02/18/22 2148    12/26/2021   10:04 AM 07/22/2021    7:23 AM 06/17/2021    7:17 AM  Last 3 Weights  Weight (lbs) 132 lb 4.4 oz 149 lb 14.6 oz 149 lb 14.6 oz  Weight (kg) 60 kg 68 kg 68 kg     There is no height or weight on file to calculate BMI.  General: Very frail and cachectic appearing female in NAD HEENT: OP clear Neck: no JVD Vascular: LUE fistula with palpable thrill and audible bruit Cardiac:  normal S1, S2; RRR; no murmur, rubs or gallops Lungs: Bilateral Rales heard throughout lung fields, no wheezes or rhonchi Abd: soft, nontender, no hepatomegaly  Ext: no edema Musculoskeletal:  No deformities Skin: warm and dry  Neuro: Grossly moves all extremities Psych: Blunted affect, did not answer orientation questions  EKG:    Telemetry:  Telemetry was personally reviewed and demonstrates:  NSR  Relevant CV Studies:  TTE 02/13/18:  Study Conclusions   - Left ventricle: The cavity size was normal. There was mild    asymmetric hypertrophy. Systolic function was normal. The    estimated ejection fraction was in the range of 55% to 60%. Wall    motion was normal; there were no regional wall motion    abnormalities. There was an increased relative contribution of    atrial contraction to ventricular filling. Doppler parameters are     consistent with abnormal left ventricular relaxation (grade 1    diastolic dysfunction).  - Aortic valve: There was mild regurgitation.  - Pulmonary arteries: Systolic pressure could not be accurately    estimated.   Laboratory Data:  High Sensitivity Troponin:   Recent Labs  Lab 02/18/22 1442  TROPONINIHS 374*     Chemistry Recent Labs  Lab 02/18/22 1405 02/18/22 1453  NA 136  --   K 3.5  --   CL 94*  --   CO2 29  --   GLUCOSE 275*  --   BUN 21  --   CREATININE 3.22*  --   CALCIUM 9.4  --   MG  --  2.1  GFRNONAA 15*  --   ANIONGAP 13  --     Recent Labs  Lab 02/18/22 1405  PROT 7.1  ALBUMIN 2.7*  AST 46*  ALT 28  ALKPHOS 166*  BILITOT 0.8   Lipids No results for input(s): "CHOL", "TRIG", "HDL", "LABVLDL", "LDLCALC", "CHOLHDL" in the last 168 hours.  Hematology Recent Labs  Lab 02/18/22 1405  WBC 11.0*  RBC 3.97  HGB 13.1  HCT 39.5  MCV 99.5  MCH 33.0  MCHC 33.2  RDW 14.6  PLT 221   Thyroid  Recent Labs  Lab 02/18/22 1600  TSH 6.264*    BNPNo results for input(s): "BNP", "PROBNP" in the last 168 hours.  DDimer No results for input(s): "DDIMER" in the last 168 hours.   Radiology/Studies:  CT CHEST ABDOMEN PELVIS W CONTRAST  Result Date: 02/18/2022 CLINICAL DATA:  Sepsis. Diminished appetite for 2 weeks. Increased sweating. EXAM: CT CHEST, ABDOMEN, AND PELVIS WITH CONTRAST TECHNIQUE: Multidetector CT imaging of the chest, abdomen and pelvis was performed following the standard protocol during bolus administration of intravenous contrast. RADIATION DOSE REDUCTION: This exam was performed according to the departmental dose-optimization program which includes automated exposure control, adjustment of the mA and/or kV according to patient size and/or use of iterative reconstruction technique. CONTRAST:  17mL OMNIPAQUE IOHEXOL 300 MG/ML  SOLN COMPARISON:  Chest radiograph earlier today. Abdominopelvic CT 02/16/2018 FINDINGS: CT CHEST FINDINGS  Cardiovascular: Mild cardiomegaly. There are coronary artery and pericardial calcifications. No pericardial effusion. Moderate atherosclerosis of the thoracic aorta without evidence of acute aortic finding. Vascular stent arising from the left subclavian artery coursing superiorly is only partially included in the field of view, but may be occluded, image 1 series 3. There is no obvious pulmonary embolus in the central most pulmonary arteries, although assessment for pulmonary embolus is limited. Mediastinum/Nodes: Scattered small mediastinal lymph nodes, not enlarged by size criteria. There are mildly prominent bilateral hilar nodes. The esophagus is patulous and contains scattered intraluminal fluid. No wall thickening. No thyroid nodule. Lungs/Pleura: Moderate layering right pleural effusion with associated compressive atelectasis. Moderate left pleural effusion is partially loculated and courses into the fissure. There is associated compressive atelectasis. Calcified granuloma in the right upper lobe. Mild hazy ground-glass opacity in the dependent lungs. Mild left lower lobe bronchial thickening with occasional mucous plugging. No endobronchial lesion or debris in the more proximal bronchi. Musculoskeletal: Scattered Schmorl's nodes throughout the thoracic spine. There are no acute or suspicious osseous abnormalities. CT ABDOMEN PELVIS FINDINGS Hepatobiliary: No focal hepatic abnormality. Gallbladder physiologically distended, no calcified stone. No biliary dilatation. Pancreas: No ductal dilatation or inflammation. Spleen: Normal in size without focal abnormality. Adrenals/Urinary Tract: No adrenal nodule. Sequela of chronic renal disease with native renal atrophy. No hydronephrosis or renal inflammation. The urinary bladder is essentially empty, but thick walled. Stomach/Bowel: Fluid within the stomach, no definite gastric wall thickening. Occasional fluid-filled loops of small bowel. There is suggestion of  mild diffuse small bowel wall thickening. No obstruction. There is colonic wall thickening with mild pericolonic edema involving the transverse colon. Possible additional areas of colonic inflammation in the sigmoid. No bowel pneumatosis. The appendix is not visualized. Vascular/Lymphatic: Advanced aortic atherosclerosis. Moderate atherosclerosis of aortic branches. No evidence of embolic disease. There may be areas of stenosis of the distal SMA related to atheromatous plaque. No bulky adenopathy. Reproductive: Diffuse uterine vascular calcifications. No adnexal mass. Other: Lower abdominal ventral abdominal wall hernia contains only fat. Small right inguinal hernia contains small amount of free fluid. No free air or ascites. No abdominopelvic collection. Musculoskeletal: Vague sclerotic focus in the right iliac bone is unchanged. Remote right posterior lower rib fracture. IMPRESSION: 1. Colonic wall thickening with mild pericolonic edema involving the transverse colon, suspicious for colitis. Possible additional areas of colonic inflammation in the sigmoid colon. There is also mild diffuse small bowel wall thickening small possible enteritis. 2. Moderate layering right pleural effusion with associated compressive atelectasis. Moderate left pleural effusion is partially loculated and courses into the fissure. 3. Mild hazy ground-glass opacity in the dependent lungs, atelectasis or mild pulmonary edema. 4. Patulous esophagus with scattered intraluminal fluid, suggesting reflux. 5. Thick-walled urinary bladder, chronic. 6. Additional chronic findings are  stable as described. Aortic Atherosclerosis (ICD10-I70.0). Electronically Signed   By: Keith Rake M.D.   On: 02/18/2022 21:22   DG Chest 2 View  Result Date: 02/18/2022 CLINICAL DATA:  Not eating.  Wheezing. EXAM: CHEST - 2 VIEW COMPARISON:  09/06/2019. FINDINGS: Cardiac silhouette is mildly enlarged. No mediastinal or hilar masses. No evidence of  adenopathy. Small effusions. Mild lung base opacity consistent with atelectasis. Remainder of the lungs is clear. No pneumothorax. Stable left subclavian to axillary stent. Skeletal structures are intact. IMPRESSION: 1. No acute cardiopulmonary disease. 2. Small effusions with mild basilar atelectasis. No convincing pneumonia or pulmonary edema. Electronically Signed   By: Lajean Manes M.D.   On: 02/18/2022 16:23   CT Head Wo Contrast  Result Date: 02/18/2022 CLINICAL DATA:  Mental status change, unknown cause EXAM: CT HEAD WITHOUT CONTRAST TECHNIQUE: Contiguous axial images were obtained from the base of the skull through the vertex without intravenous contrast. RADIATION DOSE REDUCTION: This exam was performed according to the departmental dose-optimization program which includes automated exposure control, adjustment of the mA and/or kV according to patient size and/or use of iterative reconstruction technique. COMPARISON:  Head CT 07/15/2020 FINDINGS: Brain: Stable degree of atrophy and chronic small vessel ischemia from prior exam. Remote lacunar infarcts in the right external capsule. Remote infarct in the right cerebellum and high right parietal lobe. No evidence of acute ischemia. No hemorrhage or subdural collection. No hydrocephalus. No midline shift or mass effect. Enlarged partially empty sella, unchanged. Vascular: Atherosclerosis of skullbase vasculature without hyperdense vessel or abnormal calcification. Skull: No fracture or focal lesion. Sinuses/Orbits: Chronic mucosal thickening of ethmoid air cells with frothy debris in the sphenoid sinus. Postsurgical change in both globes. Other: None. IMPRESSION: 1. No acute intracranial abnormality. 2. Stable atrophy, chronic small vessel ischemia, and remote infarcts. Electronically Signed   By: Keith Rake M.D.   On: 02/18/2022 16:22     Assessment and Plan:   Sarah Kelley is a 71 y.o. female with a hx of ESRD on IHD, HTN, HLD, and  IDDM2 who is being seen 02/18/2022 for the evaluation of elevated troponins at the request of Dr Matilde Sprang.  #Elevated Troponins :: Patient presented with FTT and nondescript diaphoresis.  Troponins were checked as a part of her ED evaluation with an initial troponin elevated at 374.  EKG is notable for Q waves in the inferior leads which are new from her EKG in January.  Patient has no chest pain, shortness of breath, or ischemic symptoms whatsoever.  An isolated troponin elevation particularly in the setting of known ESRD is challenging to interpret.  The EKG changes could represent old infarction and not necessarily something acute.  Given that she is stable and asymptomatic from a cardiac perspective, I think it is most appropriate to trend her troponins and see their trajectory.  If the change in her troponins are concerning for ACS then can manage accordingly.  Otherwise we will focus care on her other many medical comorbidities. -Trend troponins  #HTN #HLD -continue home atorvastatin -continue home lisinopril 20 mg daily if blood pressure permits   Risk Assessment/Risk Scores:  {Complete the following score calculators/questions to meet required metrics.  Press F2         :[id number]}   {Is the patient being seen for CHEST PAIN, UNSTABLE ANGINA, NSTEMI or STEMI?     :[id number]} {Does this patient have CHF or CHF symptoms?      :[id number]} {Does this patient have ATRIAL FIBRILLATION?[phone]}  {  Are we signing off today?:[id number]}  For questions or updates, please contact [place] Please consult [email or web address] for contact info under    Signed, Hershal Coria, MD  02/18/2022 9:48 PM

## 2022-02-18 NOTE — ED Triage Notes (Signed)
Per interpretor patient has had decreased appetite x 2 weeks. Patient with increased sweating and for 2 days states that she feels full. Last dialysis this am. Patient with no vomiting nor diarrhea and denies pain

## 2022-02-18 NOTE — Progress Notes (Signed)
Pt has been stuck multiple times with and without [place] to obtain existing IV. IV Team unable to obtain additional IV. Pt needs central line placed if additional access is needed.

## 2022-02-18 NOTE — ED Notes (Signed)
Delayed care due to difficult IV access.

## 2022-02-18 NOTE — Progress Notes (Addendum)
At bedside for IV consult.  Pt not in room.  Has been transported to CT.  RN to place consult once pt back in room.

## 2022-02-18 NOTE — Assessment & Plan Note (Addendum)
-  lower Bp's in 80's - 90's noted today -> related to pain meds? -Continue to hold BP medications. -continue midodrine

## 2022-02-18 NOTE — ED Provider Notes (Signed)
[place] EMERGENCY DEPARTMENT Provider Note   CSN: [id number] Arrival date & time: 02/18/22  1335     History {Add pertinent medical, surgical, social history, OB history to HPI:1} No chief complaint on file.   Sarah Kelley is a 71 y.o. female.  HPI     Home Medications Prior to Admission medications   Medication Sig Start Date End Date Taking? Authorizing Provider  amLODipine (NORVASC) 2.5 MG tablet Take 1 tablet (2.5 mg total) by mouth daily. 06/10/21   Ladell Pier, MD  atorvastatin (LIPITOR) 40 MG tablet TAKE 1 TABLET (40 MG TOTAL) BY MOUTH DAILY. (office visit for refills) 11/16/21 11/16/22  [name], MD  Blood Glucose Monitoring Suppl (TRUE METRIX METER) DEVI 1 each by Does not apply route 3 (three) times daily before meals. 07/25/17   [name], MD  cetirizine (ZYRTEC) 10 MG tablet Take 1 tablet (10 mg total) by mouth daily. 06/10/20   Mayers, Cari S, PA-C  cinacalcet (SENSIPAR) 30 MG tablet Take 1 tablet by mouth once a day Patient not taking: Reported on 07/20/2021 06/14/21     DULoxetine (CYMBALTA) 60 MG capsule TAKE 1 CAPSULE (60 MG TOTAL) BY MOUTH DAILY. 11/16/21 11/16/22  [name], MD  ethyl chloride spray SPRAY 1 SPRAY TO THE SKIN 3 TIMES A WEEK AS NEEDED. SPRAY A SMALL AMOUNT JUST PRIOR TO NEEDLE INSERTION. 02/10/22   [name], MD  gabapentin (NEURONTIN) 300 MG capsule TAKE 1 CAPSULE (300 MG TOTAL) BY MOUTH AT BEDTIME. 12/26/21 12/26/22  [name], MD  glucose blood (TRUE METRIX BLOOD GLUCOSE TEST) test strip Use 3 times daily before meals 02/15/22   [name], MD  insulin glargine (LANTUS) 100 UNIT/ML injection Inject subcutaneously twice daily 16 units in the morning and 8 units in the evening 11/16/21   [name], MD  Insulin Syringe-Needle U-100 (TRUEPLUS INSULIN SYRINGE) 31G X 5/16" 1 ML MISC USE AS DIRECTED 03/11/21   [name], MD  lisinopril (ZESTRIL) 20 MG tablet TAKE 1 TABLET (20 MG  TOTAL) BY MOUTH DAILY. 12/29/21 12/29/22  [name], MD  Nutritional Supplements (FEEDING SUPPLEMENT, NEPRO CARB STEADY,) LIQD Take 237 mLs by mouth See admin instructions. Take 1 can (237 ml) by mouth once daily on Tuesdays, Thursdays & Saturday morning. May take an additional can (237 ml) by mouth if needed for poor appetite.    [provider]  omeprazole (PRILOSEC) 20 MG capsule TAKE 1 CAPSULE (20 MG TOTAL) BY MOUTH DAILY. Patient not taking: Reported on 07/20/2021 01/05/21 01/05/22  Mayers, Loraine Grip, PA-C  sucroferric oxyhydroxide (VELPHORO) 500 MG chewable tablet Chew 500 mg by mouth 3 (three) times daily with meals.    [provider]  TRUEplus Lancets 28G MISC Use as directed 3 (three) times daily. 02/14/21   [name], MD      Allergies    Patient has no known allergies.    Review of Systems   Review of Systems  Physical Exam Updated Vital Signs BP (!) 121/57   Pulse 78   Temp 97.7 F (36.5 C) (Oral)   Resp 14   LMP  (LMP Unknown)   SpO2 100%  Physical Exam  ED Results / Procedures / Treatments   Labs (all labs ordered are listed, but only abnormal results are displayed) Labs Reviewed  BASIC METABOLIC PANEL  CBC  URINALYSIS, ROUTINE W REFLEX MICROSCOPIC  CBG MONITORING, ED    EKG None  Radiology No results found.  Procedures Procedures  {Document  cardiac monitor, telemetry assessment procedure when appropriate:1}  Medications Ordered in ED Medications - No data to display  ED Course/ Medical Decision Making/ A&P                           Medical Decision Making Amount and/or Complexity of Data Reviewed Labs: ordered. Radiology: ordered.   ***  {Document critical care time when appropriate:1} {Document review of labs and clinical decision tools ie heart score, Chads2Vasc2 etc:1}  {Document your independent review of radiology images, and any outside records:1} {Document your discussion with family members, caretakers, and with  consultants:1} {Document social determinants of health affecting pt's care:1} {Document your decision making why or why not admission, treatments were needed:1} Final Clinical Impression(s) / ED Diagnoses Final diagnoses:  None    Rx / DC Orders ED Discharge Orders     None

## 2022-02-18 NOTE — Assessment & Plan Note (Addendum)
TSH wnl with elevated free T4, unclear significance  Will need outpatient follow up of thyroid function tests Looks like "thyroid disease" is on her [place] list, but I dont see where shes on any sort of thyroid hormone replacement. Likely needs outpatient follow-up.

## 2022-02-18 NOTE — ED Notes (Signed)
IV unable to get access. MD notified.

## 2022-02-18 NOTE — Assessment & Plan Note (Addendum)
Probably the underlying cause of reduced PO intake, even more so than the GI illness. Fluid filled esophagus on CT noted  Esophagram limited due to patient's inability to follow commands, notable for age related esophageal dysmotility with poor primary contractions and abnormal disordered tertiary contractions with delayed passage of barium  GI consulted, recommend adjusting diet to foods/beverages she tolerates SLP recommending thin liquids  Minimal documented PO intake

## 2022-02-18 NOTE — ED Notes (Signed)
Unable to obtain repeat trop d/t poor venous access. EDP aware.

## 2022-02-18 NOTE — Progress Notes (Signed)
Attempted PIV x 3 with ultrasound.  Unable to thread catheter in veins.  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  Family agreeable to second assess.

## 2022-02-18 NOTE — H&P (Addendum)
History and Physical    PatientMilka Kelley CBJ:[id number] DOB: 01/24/1951 DOA: 02/18/2022 DOS: the patient was seen and examined on 02/18/2022 PCP: [name], MD  Patient coming from: Home  Chief Complaint: Poor PO intake HPI: Sarah Kelley is a 71 y.o. female with medical history significant of ESRD on TTS dialysis, last had dialysis earlier today.  HTN, HLD, DM2 on insulin.  Pt presents to ED with 2 week h/o reduced PO intake, diaphoresis over past 3-4 days and profuse diarrhea.  Per PT: feels like food gets stuck in throat.  No CP, SOB.  No sick contacts in family, no one else in family is sick.  Review of Systems: As mentioned in the history of present illness. All other systems reviewed and are negative. Past Medical History:  Diagnosis Date   Diabetes mellitus    ESRD (end stage renal disease) ([place])    dialysis T/Th/Sa   GERD (gastroesophageal reflux disease)    Hyperlipidemia    Hypertension    Iron deficiency anemia    MRSA infection    hx of   Neuropathy    Diabetic   Pneumonia 07/2016   Renal disorder    Seizure-like activity ([place])    Sepsis ([place])    Thyroid disease    Wears dentures    Past Surgical History:  Procedure Laterality Date   AMPUTATION Left 05/21/2015   Procedure: Left Foot 3rd Ray Amputation;  Surgeon: Newt Minion, MD;  Location: [facility];  Service: Orthopedics;  Laterality: Left;   AV FISTULA PLACEMENT Left 03/28/2016   Procedure: ARTERIOVENOUS (AV) FISTULA CREATION LEFT ARM;  Surgeon: Waynetta Sandy, MD;  Location: [facility];  Service: Vascular;  Laterality: Left;   AV FISTULA PLACEMENT Right 06/04/2019   Procedure: ARTERIOVENOUS (AV) FISTULA CREATION RIGHT ARM;  Surgeon: Waynetta Sandy, MD;  Location: [facility];  Service: Vascular;  Laterality: Right;   AV FISTULA PLACEMENT Left 03/11/2021   Procedure: INSERTION OF LEFT ARM ARTERIOVENOUS (AV) GORE-TEX GRAFT;  Surgeon: Waynetta Sandy, MD;   Location: [facility];  Service: Vascular;  Laterality: Left;   DILATION AND CURETTAGE OF UTERUS     EYE SURGERY     FISTULA SUPERFICIALIZATION Left 09/04/2016   Procedure: FISTULA SUPERFICIALIZATION LEFT UPPER ARM;  Surgeon: Waynetta Sandy, MD;  Location: [facility];  Service: Vascular;  Laterality: Left;   FISTULA SUPERFICIALIZATION Right 10/08/2019   Procedure: FISTULA SUPERFICIALIZATION OR RIGHT ARM;  Surgeon: Waynetta Sandy, MD;  Location: [facility];  Service: Vascular;  Laterality: Right;   IR AV DIALY SHUNT INTRO NEEDLE/INTRAC INITIAL W/PTA/STENT/IMG LT Left 06/13/2017   IR AV DIALY SHUNT INTRO NEEDLE/INTRAC INITIAL W/PTA/STENT/IMG LT Left 09/12/2017   IR AV DIALY SHUNT INTRO NEEDLE/INTRACATH INITIAL W/PTA/IMG LEFT  11/10/2016   IR AV DIALY SHUNT INTRO NEEDLE/INTRACATH INITIAL W/PTA/IMG LEFT  03/09/2017   IR AV DIALY SHUNT INTRO NEEDLE/INTRACATH INITIAL W/PTA/IMG LEFT  12/12/2017   IR AV DIALY SHUNT INTRO NEEDLE/INTRACATH INITIAL W/PTA/IMG LEFT  02/27/2018   IR AV DIALY SHUNT INTRO NEEDLE/INTRACATH INITIAL W/PTA/IMG LEFT  06/05/2018   IR AV DIALY SHUNT INTRO NEEDLE/INTRACATH INITIAL W/PTA/IMG LEFT  09/11/2018   IR AV DIALY SHUNT INTRO NEEDLE/INTRACATH INITIAL W/PTA/IMG LEFT  12/26/2021   IR AV DIALY SHUNT INTRO NEEDLE/INTRACATH INITIAL W/PTA/IMG RIGHT Right 04/21/2020   IR AV DIALY SHUNT INTRO NEEDLE/INTRACATH INITIAL W/PTA/IMG RIGHT Right 10/08/2020   IR DIALY SHUNT INTRO NEEDLE/INTRACATH INITIAL W/IMG LEFT Left 05/08/2018   IR DIALY SHUNT INTRO NEEDLE/INTRACATH INITIAL W/IMG LEFT  Left 06/17/2021   IR FLUORO GUIDE CV LINE RIGHT  02/01/2019   IR FLUORO GUIDE CV LINE RIGHT  07/14/2020   IR FLUORO GUIDE CV LINE RIGHT  12/29/2020   IR GENERIC HISTORICAL  08/01/2016   IR FLUORO GUIDE CV LINE RIGHT 08/01/2016 [name], MD [facility]   IR GENERIC HISTORICAL  08/01/2016   IR US GUIDE VASC ACCESS RIGHT 08/01/2016 [name], MD [facility]   IR REMOVAL TUN CV CATH W/O FL  11/01/2016   IR REMOVAL TUN CV  CATH W/O FL  01/02/2020   IR REMOVAL TUN CV CATH W/O FL  05/04/2021   IR THROMBECTOMY AV FISTULA W/THROMBOLYSIS INC/SHUNT/IMG LEFT Left 01/31/2019   IR THROMBECTOMY AV FISTULA W/THROMBOLYSIS/PTA INC/SHUNT/IMG LEFT Left 01/27/2019   IR THROMBECTOMY AV FISTULA W/THROMBOLYSIS/PTA INC/SHUNT/IMG LEFT Left 07/16/2020   IR THROMBECTOMY AV FISTULA W/THROMBOLYSIS/PTA/STENT INC/SHUNT/IMG RT Right 09/24/2020   IR US GUIDE VASC ACCESS LEFT  11/10/2016   IR US GUIDE VASC ACCESS LEFT  09/12/2017   IR US GUIDE VASC ACCESS LEFT  12/12/2017   IR US GUIDE VASC ACCESS LEFT  06/05/2018   IR US GUIDE VASC ACCESS LEFT  09/11/2018   IR US GUIDE VASC ACCESS LEFT  01/27/2019   IR US GUIDE VASC ACCESS LEFT  02/01/2019   IR US GUIDE VASC ACCESS LEFT  12/26/2021   IR US GUIDE VASC ACCESS RIGHT  02/01/2019   IR US GUIDE VASC ACCESS RIGHT  07/14/2020   IR US GUIDE VASC ACCESS RIGHT  07/16/2020   IR US GUIDE VASC ACCESS RIGHT  10/08/2020   IR US GUIDE VASC ACCESS RIGHT  12/29/2020   PERIPHERAL VASCULAR BALLOON ANGIOPLASTY Left 07/22/2021   Procedure: PERIPHERAL VASCULAR BALLOON ANGIOPLASTY;  Surgeon: Cherre Robins, MD;  Location: [facility];  Service: Cardiovascular;  Laterality: Left;   UPPER EXTREMITY VENOGRAPHY Bilateral 02/28/2021   Procedure: UPPER EXTREMITY VENOGRAPHY;  Surgeon: Waynetta Sandy, MD;  Location: [facility];  Service: Cardiovascular;  Laterality: Bilateral;   Social History:  reports that she has never smoked. She has never used smokeless tobacco. She reports that she does not currently use alcohol. She reports that she does not use drugs.  No Known Allergies  No family history on file.  Prior to Admission medications   Medication Sig Start Date End Date Taking? Authorizing Provider  amLODipine (NORVASC) 2.5 MG tablet Take 1 tablet (2.5 mg total) by mouth daily. 06/10/21   Ladell Pier, MD  atorvastatin (LIPITOR) 40 MG tablet TAKE 1 TABLET (40 MG TOTAL) BY MOUTH DAILY. (office visit  for refills) 11/16/21 11/16/22  [name], MD  Blood Glucose Monitoring Suppl (TRUE METRIX METER) DEVI 1 each by Does not apply route 3 (three) times daily before meals. 07/25/17   [name], MD  cetirizine (ZYRTEC) 10 MG tablet Take 1 tablet (10 mg total) by mouth daily. 06/10/20   Mayers, Cari S, PA-C  cinacalcet (SENSIPAR) 30 MG tablet Take 1 tablet by mouth once a day Patient not taking: Reported on 07/20/2021 06/14/21     DULoxetine (CYMBALTA) 60 MG capsule TAKE 1 CAPSULE (60 MG TOTAL) BY MOUTH DAILY. 11/16/21 11/16/22  [name], MD  ethyl chloride spray SPRAY 1 SPRAY TO THE SKIN 3 TIMES A WEEK AS NEEDED. SPRAY A SMALL AMOUNT JUST PRIOR TO NEEDLE INSERTION. 02/10/22   [name], MD  gabapentin (NEURONTIN) 300 MG capsule TAKE 1 CAPSULE (300 MG TOTAL) BY MOUTH AT BEDTIME. 12/26/21 12/26/22  Newlin,  [name], MD  glucose blood (TRUE METRIX BLOOD GLUCOSE TEST) test strip Use 3 times daily before meals 02/15/22   [name], MD  insulin glargine (LANTUS) 100 UNIT/ML injection Inject subcutaneously twice daily 16 units in the morning and 8 units in the evening 11/16/21   [name], MD  Insulin Syringe-Needle U-100 (TRUEPLUS INSULIN SYRINGE) 31G X 5/16" 1 ML MISC USE AS DIRECTED 03/11/21   [name], MD  lisinopril (ZESTRIL) 20 MG tablet TAKE 1 TABLET (20 MG TOTAL) BY MOUTH DAILY. 12/29/21 12/29/22  [name], MD  Nutritional Supplements (FEEDING SUPPLEMENT, NEPRO CARB STEADY,) LIQD Take 237 mLs by mouth See admin instructions. Take 1 can (237 ml) by mouth once daily on Tuesdays, Thursdays & Saturday morning. May take an additional can (237 ml) by mouth if needed for poor appetite.    [provider]  omeprazole (PRILOSEC) 20 MG capsule TAKE 1 CAPSULE (20 MG TOTAL) BY MOUTH DAILY. Patient not taking: Reported on 07/20/2021 01/05/21 01/05/22  Mayers, Loraine Grip, PA-C  sucroferric oxyhydroxide (VELPHORO) 500 MG chewable tablet Chew 500 mg by mouth 3 (three) times daily with  meals.    [provider]  TRUEplus Lancets 28G MISC Use as directed 3 (three) times daily. 02/14/21   [name], MD    Physical Exam: Vitals:   02/18/22 2000 02/18/22 2030 02/18/22 2115 02/18/22 2145  BP: (!) 114/18  (!) 98/29 112/67  Pulse: 73 77  80  Resp: 16 18 (!) 22 15  Temp:  98 F (36.7 C)    TempSrc:  Oral    SpO2: 95% 95%  93%   Constitutional: NAD, calm, comfortable, frail, cachectic Eyes: PERRL, lids and conjunctivae normal ENMT: Mucous membranes are moist. Posterior pharynx clear of any exudate or lesions.Normal dentition.  Neck: normal, supple, no masses, no thyromegaly Respiratory: Rales present Cardiovascular: Regular rate and rhythm, no murmurs / rubs / gallops. No extremity edema. 2+ pedal pulses. No carotid bruits.  LUE fistula with thrill  Abdomen: no tenderness, no masses palpated. No hepatosplenomegaly. Bowel sounds positive.  Musculoskeletal: no clubbing / cyanosis. No joint deformity upper and lower extremities. Good ROM, no contractures. Normal muscle tone.  Skin: no rashes, lesions, ulcers. No induration Neurologic: CN 2-12 grossly intact. Sensation intact, DTR normal. Strength 5/5 in all 4.  Psychiatric: Blunted affect  Data Reviewed:    CBC    Component Value Date/Time   WBC 11.0 (H) 02/18/2022 1405   RBC 3.97 02/18/2022 1405   HGB 13.1 02/18/2022 1405   HGB 9.7 (L) 06/11/2020 1013   HCT 39.5 02/18/2022 1405   HCT 29.6 (L) 06/11/2020 1013   PLT 221 02/18/2022 1405   PLT 190 06/11/2020 1013   MCV 99.5 02/18/2022 1405   MCV 95 06/11/2020 1013   MCH 33.0 02/18/2022 1405   MCHC 33.2 02/18/2022 1405   RDW 14.6 02/18/2022 1405   RDW 13.4 06/11/2020 1013   LYMPHSABS 1.4 07/16/2021 1208   LYMPHSABS 1.5 06/11/2020 1013   MONOABS 0.6 07/16/2021 1208   EOSABS 0.1 07/16/2021 1208   EOSABS 0.2 06/11/2020 1013   BASOSABS 0.1 07/16/2021 1208   BASOSABS 0.1 06/11/2020 1013   CMP     Component Value Date/Time   NA 136 02/18/2022 1405    NA 139 06/11/2020 1013   K 3.5 02/18/2022 1405   CL 94 (L) 02/18/2022 1405   CO2 29 02/18/2022 1405   GLUCOSE 275 (H) 02/18/2022 1405   BUN 21 02/18/2022 1405   BUN 30 (  H) 06/11/2020 1013   CREATININE 3.22 (H) 02/18/2022 1405   CREATININE 3.97 (H) 01/13/2016 1051   CALCIUM 9.4 02/18/2022 1405   PROT 7.1 02/18/2022 1405   PROT 6.7 06/11/2020 1013   ALBUMIN 2.7 (L) 02/18/2022 1405   ALBUMIN 3.6 (L) 06/11/2020 1013   AST 46 (H) 02/18/2022 1405   ALT 28 02/18/2022 1405   ALKPHOS 166 (H) 02/18/2022 1405   BILITOT 0.8 02/18/2022 1405   BILITOT 0.3 06/11/2020 1013   GFRNONAA 15 (L) 02/18/2022 1405   GFRNONAA 11 (L) 01/13/2016 1051   GFRAA 8 (L) 06/11/2020 1013   GFRAA 13 (L) 01/13/2016 1051   Trop 374  TSH 6.2  C.Diff neg  CT AP: IMPRESSION: 1. Colonic wall thickening with mild pericolonic edema involving the transverse colon, suspicious for colitis. Possible additional areas of colonic inflammation in the sigmoid colon. There is also mild diffuse small bowel wall thickening small possible enteritis. 2. Moderate layering right pleural effusion with associated compressive atelectasis. Moderate left pleural effusion is partially loculated and courses into the fissure. 3. Mild hazy ground-glass opacity in the dependent lungs, atelectasis or mild pulmonary edema. 4. Patulous esophagus with scattered intraluminal fluid, suggesting reflux. 5. Thick-walled urinary bladder, chronic. 6. Additional chronic findings are stable as described.  Assessment and Plan: * Enterocolitis Presumed infectious, with frequent diarrhea and decreased PO intake. GI pathogen pnl pending C.Diff neg Will start empiric rocephin + flagyl empirically See ESRD below regarding fluid status  Dysphagia Probably the underlying cause of reduced PO intake, even more so than the GI illness. Fluid filled esophagus on CT noted SLP eval for swallowing eval But sounds like it might be more esophageal phase  than oropharyngeal phase Probably needs GI consult during admission. Will leave pt on clear liquid diet for the moment.  Elevated troponin Cards consulting on patient They indicate that this is likely demand ischemia Trend trops for now  ESRD (end stage renal disease) ([place]) Appears to be possibly volume overloaded with mod B pleural effusions on CT scan despite recent GI illness with diarrhea, poor PO intake, and dialysis earlier today. Despite this no symptoms of volume overload, so B effusions may be very chronic. Holding off on further IVF (got 1L bolus earlier in evening in ED) Check BNP Call nephro in AM, may need further dialysis.  Hypothyroidism Mildly low TSH today on labs Checking T4 to confirm Start synthroid if confirmed. Looks like "thyroid disease" is on her [place] list, but I dont see where shes on any sort of thyroid hormone replacement, though med-rec still pending for this admit.  Type 2 diabetes mellitus with chronic kidney disease on chronic dialysis, with long-term current use of insulin (HCC) Putting on sensitive SSI Q4H for the moment due to decreased PO intake.  Essential hypertension Soft BPs, holding home BP meds.      Advance Care Planning:   Code Status: Full Code  Consults: Cards  Family Communication: Family at bedside  Severity of Illness: The appropriate patient status for this patient is OBSERVATION. Observation status is judged to be reasonable and necessary in order to provide the required intensity of service to ensure the patient's safety. The patient's presenting symptoms, physical exam findings, and initial radiographic and laboratory data in the context of their medical condition is felt to place them at decreased risk for further clinical deterioration. Furthermore, it is anticipated that the patient will be medically stable for discharge from the hospital within 2 midnights of admission.   Author:  Etta Quill., DO 02/18/2022 10:23  PM  For on call review [email or web address].

## 2022-02-18 NOTE — Assessment & Plan Note (Addendum)
Presumed infectious initially on admission, with frequent diarrhea and decreased PO intake. 1. GI pathogen pnl negative. 2. C.Diff neg 3. CT abdomen and pelvis concerning for colitis.  S/p ceftriaxone/flagyl. 4. See ESRD below regarding fluid status 5. GI following and appreciate their input and recommendations.  Pericolonic edema could be related to overload?  No recommendations for any GI procedures. 6. Repeat CT C/Jazmaine Fuelling/P with findings concerning for layering small cbd stones, mild diffuse colonic wall thickening, moderate R and small L effusions -> bili wnl, LFT's wnl, don't think MRCP appropriate at this time

## 2022-02-18 NOTE — Assessment & Plan Note (Addendum)
-  Hemoglobin A1c 7.7  (02/18/2022 ). -Continue CBGs to before meals and at bedtime.   - hypoglycemia today, hold basal, continue very sensitive SSI, will continue to monitor, has d10 ordered for now

## 2022-02-18 NOTE — Assessment & Plan Note (Addendum)
Appears to be possibly volume overloaded with mod B pleural effusions on CT scan despite recent GI illness with diarrhea, poor PO intake, and dialysis on 02/18/2022 prior to presentation in the ED. Nephrology following and patient currently in HD Volume management per renal

## 2022-02-18 NOTE — ED Notes (Signed)
IV team in with patient. 

## 2022-02-18 NOTE — Assessment & Plan Note (Signed)
Cards consulting on patient They indicate that this is likely demand ischemia 1. Troponin is elevated but seem to be flattened.  Patient with doubt any chest pain. 2. 2D echo from 02/13/2018 with EF of 55 to 16%,XWRU, grade 1 diastolic dysfunction. 3. Repeat 2D echo with EF of 04%,VWUJ, grade 1 diastolic dysfunction, normal right ventricular systolic function, mild to moderate MVR, no evidence of mitral stenosis. 4. Patient seen in consultation by cardiology who reviewed 2D echo and EKG and will not pursue any further cardiac evaluation at this time, cardiology feels troponins flat not consistent with ACS and also feel patient is not an ideal candidate for invasive work-up.

## 2022-02-19 ENCOUNTER — Encounter (HOSPITAL_COMMUNITY): Payer: Self-pay | Admitting: Internal Medicine

## 2022-02-19 ENCOUNTER — Observation Stay (HOSPITAL_COMMUNITY): Payer: Medicaid Other

## 2022-02-19 DIAGNOSIS — Z7189 Other specified counseling: Secondary | ICD-10-CM | POA: Diagnosis not present

## 2022-02-19 DIAGNOSIS — E11649 Type 2 diabetes mellitus with hypoglycemia without coma: Secondary | ICD-10-CM | POA: Diagnosis not present

## 2022-02-19 DIAGNOSIS — R64 Cachexia: Secondary | ICD-10-CM | POA: Diagnosis present

## 2022-02-19 DIAGNOSIS — E039 Hypothyroidism, unspecified: Secondary | ICD-10-CM | POA: Diagnosis present

## 2022-02-19 DIAGNOSIS — R778 Other specified abnormalities of plasma proteins: Secondary | ICD-10-CM | POA: Diagnosis not present

## 2022-02-19 DIAGNOSIS — R1310 Dysphagia, unspecified: Secondary | ICD-10-CM | POA: Diagnosis not present

## 2022-02-19 DIAGNOSIS — J811 Chronic pulmonary edema: Secondary | ICD-10-CM | POA: Diagnosis present

## 2022-02-19 DIAGNOSIS — I248 Other forms of acute ischemic heart disease: Secondary | ICD-10-CM | POA: Diagnosis present

## 2022-02-19 DIAGNOSIS — R52 Pain, unspecified: Secondary | ICD-10-CM | POA: Diagnosis not present

## 2022-02-19 DIAGNOSIS — E11319 Type 2 diabetes mellitus with unspecified diabetic retinopathy without macular edema: Secondary | ICD-10-CM | POA: Diagnosis present

## 2022-02-19 DIAGNOSIS — E1140 Type 2 diabetes mellitus with diabetic neuropathy, unspecified: Secondary | ICD-10-CM | POA: Diagnosis present

## 2022-02-19 DIAGNOSIS — F05 Delirium due to known physiological condition: Secondary | ICD-10-CM | POA: Diagnosis present

## 2022-02-19 DIAGNOSIS — Z66 Do not resuscitate: Secondary | ICD-10-CM | POA: Diagnosis not present

## 2022-02-19 DIAGNOSIS — Z794 Long term (current) use of insulin: Secondary | ICD-10-CM | POA: Diagnosis not present

## 2022-02-19 DIAGNOSIS — R627 Adult failure to thrive: Secondary | ICD-10-CM | POA: Diagnosis not present

## 2022-02-19 DIAGNOSIS — D631 Anemia in chronic kidney disease: Secondary | ICD-10-CM | POA: Diagnosis present

## 2022-02-19 DIAGNOSIS — F03C3 Unspecified dementia, severe, with mood disturbance: Secondary | ICD-10-CM | POA: Diagnosis present

## 2022-02-19 DIAGNOSIS — A09 Infectious gastroenteritis and colitis, unspecified: Secondary | ICD-10-CM | POA: Diagnosis present

## 2022-02-19 DIAGNOSIS — F0390 Unspecified dementia without behavioral disturbance: Secondary | ICD-10-CM | POA: Diagnosis not present

## 2022-02-19 DIAGNOSIS — E1122 Type 2 diabetes mellitus with diabetic chronic kidney disease: Secondary | ICD-10-CM | POA: Diagnosis present

## 2022-02-19 DIAGNOSIS — Z992 Dependence on renal dialysis: Secondary | ICD-10-CM | POA: Diagnosis not present

## 2022-02-19 DIAGNOSIS — N2581 Secondary hyperparathyroidism of renal origin: Secondary | ICD-10-CM | POA: Diagnosis present

## 2022-02-19 DIAGNOSIS — J90 Pleural effusion, not elsewhere classified: Secondary | ICD-10-CM | POA: Diagnosis present

## 2022-02-19 DIAGNOSIS — R1319 Other dysphagia: Secondary | ICD-10-CM | POA: Diagnosis not present

## 2022-02-19 DIAGNOSIS — R933 Abnormal findings on diagnostic imaging of other parts of digestive tract: Secondary | ICD-10-CM | POA: Diagnosis not present

## 2022-02-19 DIAGNOSIS — N186 End stage renal disease: Secondary | ICD-10-CM | POA: Diagnosis present

## 2022-02-19 DIAGNOSIS — I10 Essential (primary) hypertension: Secondary | ICD-10-CM

## 2022-02-19 DIAGNOSIS — Z515 Encounter for palliative care: Secondary | ICD-10-CM | POA: Diagnosis not present

## 2022-02-19 DIAGNOSIS — E440 Moderate protein-calorie malnutrition: Secondary | ICD-10-CM | POA: Diagnosis present

## 2022-02-19 DIAGNOSIS — K2289 Other specified disease of esophagus: Secondary | ICD-10-CM | POA: Diagnosis present

## 2022-02-19 DIAGNOSIS — K224 Dyskinesia of esophagus: Secondary | ICD-10-CM | POA: Diagnosis present

## 2022-02-19 DIAGNOSIS — R197 Diarrhea, unspecified: Secondary | ICD-10-CM | POA: Diagnosis not present

## 2022-02-19 DIAGNOSIS — I953 Hypotension of hemodialysis: Secondary | ICD-10-CM | POA: Diagnosis not present

## 2022-02-19 DIAGNOSIS — I120 Hypertensive chronic kidney disease with stage 5 chronic kidney disease or end stage renal disease: Secondary | ICD-10-CM | POA: Diagnosis present

## 2022-02-19 DIAGNOSIS — K529 Noninfective gastroenteritis and colitis, unspecified: Secondary | ICD-10-CM | POA: Diagnosis not present

## 2022-02-19 LAB — GASTROINTESTINAL PANEL BY PCR, STOOL (REPLACES STOOL CULTURE)

## 2022-02-19 LAB — CBG MONITORING, ED
Glucose-Capillary: 98 mg/dL (ref 70–99)
Glucose-Capillary: 181 mg/dL — ABNORMAL HIGH (ref 70–99)

## 2022-02-19 LAB — TROPONIN I (HIGH SENSITIVITY)
Troponin I (High Sensitivity): 391 ng/L (ref <18)
Troponin I (High Sensitivity): 348 ng/L (ref <18)
Troponin I (High Sensitivity): 307 ng/L (ref <18)

## 2022-02-19 LAB — HEMOGLOBIN A1C
Hgb A1c MFr Bld: 7.7 % — ABNORMAL HIGH (ref 4.8–5.6)
Mean Plasma Glucose: 174.29 mg/dL

## 2022-02-19 LAB — COMPREHENSIVE METABOLIC PANEL
ALT: 20 U/L (ref 0–44)
AST: 38 U/L (ref 15–41)
Albumin: 2.3 g/dL — ABNORMAL LOW (ref 3.5–5.0)
Alkaline Phosphatase: 126 U/L (ref 38–126)
Anion gap: 13 (ref 5–15)
BUN: 27 mg/dL — ABNORMAL HIGH (ref 8–23)
CO2: 27 mmol/L (ref 22–32)
Calcium: 9.8 mg/dL (ref 8.9–10.3)
Chloride: 98 mmol/L (ref 98–111)
Creatinine, Ser: 4.28 mg/dL — ABNORMAL HIGH (ref 0.44–1.00)
GFR, Estimated: 10 mL/min — ABNORMAL LOW (ref >60)
Glucose, Bld: 193 mg/dL — ABNORMAL HIGH (ref 70–99)
Potassium: 3.6 mmol/L (ref 3.5–5.1)
Sodium: 138 mmol/L (ref 135–145)
Total Bilirubin: 0.9 mg/dL (ref 0.3–1.2)
Total Protein: 6.1 g/dL — ABNORMAL LOW (ref 6.5–8.1)

## 2022-02-19 LAB — PHOSPHORUS: Phosphorus: 5.3 mg/dL — ABNORMAL HIGH (ref 2.5–4.6)

## 2022-02-19 LAB — CBC
HCT: 33.8 % — ABNORMAL LOW (ref 36.0–46.0)
Hemoglobin: 11.1 g/dL — ABNORMAL LOW (ref 12.0–15.0)
MCH: 32.8 pg (ref 26.0–34.0)
MCHC: 32.8 g/dL (ref 30.0–36.0)
MCV: 100 fL (ref 80.0–100.0)
Platelets: 191 10*3/uL (ref 150–400)
RBC: 3.38 MIL/uL — ABNORMAL LOW (ref 3.87–5.11)
RDW: 14.7 % (ref 11.5–15.5)
WBC: 9.6 10*3/uL (ref 4.0–10.5)
nRBC: 0 % (ref 0.0–0.2)

## 2022-02-19 LAB — ECHOCARDIOGRAM COMPLETE
AR max vel: 1.48 cm2
AV Area VTI: 1.5 cm2
AV Area mean vel: 1.49 cm2
AV Mean grad: 6 mmHg
AV Peak grad: 9.9 mmHg
Ao pk vel: 1.57 m/s
Area-P 1/2: 4.6 cm2
P 1/2 time: 521 msec
S' Lateral: 3.7 cm

## 2022-02-19 LAB — GLUCOSE, CAPILLARY: Glucose-Capillary: 233 mg/dL — ABNORMAL HIGH (ref 70–99)

## 2022-02-19 LAB — BRAIN NATRIURETIC PEPTIDE: B Natriuretic Peptide: 1707.5 pg/mL — ABNORMAL HIGH (ref 0.0–100.0)

## 2022-02-19 MED ORDER — SODIUM CHLORIDE 0.9 % IV SOLN
125.0000 mg | INTRAVENOUS | Status: AC
  Administered 2022-02-21 – 2022-02-23 (×2): 125 mg via INTRAVENOUS
  Filled 2022-02-19 (×2): qty 10

## 2022-02-19 MED ORDER — ATORVASTATIN CALCIUM 40 MG PO TABS
40.0000 mg | ORAL_TABLET | Freq: Every day | ORAL | Status: DC
Start: 2022-02-19 — End: 2022-02-19

## 2022-02-19 MED ORDER — INSULIN ASPART 100 UNIT/ML IJ SOLN
0.0000 [IU] | Freq: Three times a day (TID) | INTRAMUSCULAR | Status: DC
  Administered 2022-02-19: 1 [IU] via SUBCUTANEOUS

## 2022-02-19 MED ORDER — MIDODRINE HCL 5 MG PO TABS
5.0000 mg | ORAL_TABLET | Freq: Three times a day (TID) | ORAL | Status: DC
  Administered 2022-02-19 – 2022-03-06 (×26): 5 mg via ORAL
  Filled 2022-02-19 (×28): qty 1

## 2022-02-19 MED ORDER — DULOXETINE HCL 30 MG PO CPEP: 60.0000 mg | ORAL_CAPSULE | Freq: Every day | ORAL | Status: DC

## 2022-02-19 MED ORDER — GABAPENTIN 300 MG PO CAPS
300.0000 mg | ORAL_CAPSULE | Freq: Every day | ORAL | Status: DC
Start: 2022-02-19 — End: 2022-02-19

## 2022-02-19 MED ORDER — INSULIN GLARGINE-YFGN 100 UNIT/ML ~~LOC~~ SOLN
8.0000 [IU] | Freq: Every day | SUBCUTANEOUS | Status: DC
  Administered 2022-02-19 – 2022-02-21 (×3): 8 [IU] via SUBCUTANEOUS
  Filled 2022-02-19 (×4): qty 0.08

## 2022-02-19 MED ORDER — PERFLUTREN LIPID MICROSPHERE
1.0000 mL | INTRAVENOUS | Status: AC | PRN
  Administered 2022-02-19: 2 mL via INTRAVENOUS

## 2022-02-19 MED ORDER — INSULIN ASPART 100 UNIT/ML IJ SOLN
0.0000 [IU] | Freq: Four times a day (QID) | INTRAMUSCULAR | Status: DC
  Administered 2022-02-19: 2 [IU] via SUBCUTANEOUS
  Administered 2022-02-20: 1 [IU] via SUBCUTANEOUS

## 2022-02-19 MED ORDER — SODIUM CHLORIDE 0.9 % IV BOLUS
250.0000 mL | Freq: Once | INTRAVENOUS | Status: AC
  Administered 2022-02-19: 250 mL via INTRAVENOUS

## 2022-02-19 NOTE — Consult Note (Signed)
Consultation  Referring Provider: [facility]/ [place] Silos Primary Care Physician:  [name], MD Primary Gastroenterologist:  none/ unassigned  Reason for Consultation: Failure to thrive, very poor oral intake over the past couple of weeks, possible dysphagia, diarrhea and abnormal imaging, altered mental status  HPI: Sarah Kelley is a 71 y.o. female with multiple comorbidities including history of end-stage renal disease on dialysis, hypertension, adult onset diabetes mellitus, hypothyroidism and neuropathy. She was brought to the emergency room by her family yesterday.  She has been doing poorly over the past couple weeks at home.  She has not been eating or drinking much of anything, according to her son she has not been vomiting, not complaining of any abdominal pain, has had an occasional episode of loose stool over the past couple weeks but had overt diarrhea yesterday, couple of episodes, nonbloody. No documented fever or chills, per prior notes there is some question of diaphoresis.  She did undergo dialysis yesterday. Her family who is in the room currently says she has not been talking over the past week or so but will nod her head  Work-up in the emergency room showed WBC of 11.0/hemoglobin 13.1 Creatinine 3.2 TSH 6.2 Troponin 374, BNP 1707 EKG with Q waves in leads II, III and aVF T. bili 0.8/alk phos 166/AST 46/ALT 28 Albumin 2.7 GI path panel negative and C. difficile quick screen negative  CT of the head without contrast-no acute intracranial abnormality, stable atrophy and chronic small vessel ischemia and remote infarcts  Chest x-ray-bilateral effusions  CT imaging of the chest abdomen and pelvis with contrast-moderate right and left pleural effusions, esophagus is patulous and contains scattered fluid no wall thickening, there is fluid in the stomach no definite gastric wall thickening, occasional fluid-filled loops of small bowel, suggestion of mild diffuse  small bowel wall thickening, no obstruction, colonic wall thickening with mild pericolonic edema involving the transverse colon.  History was obtained the patient's family, and I also spoke to her son by phone who has been her primary caregiver. No prior GI history.   Past Medical History:  Diagnosis Date   Diabetes mellitus    ESRD (end stage renal disease) ([place])    dialysis T/Th/Sa   GERD (gastroesophageal reflux disease)    Hyperlipidemia    Hypertension    Iron deficiency anemia    MRSA infection    hx of   Neuropathy    Diabetic   Pneumonia 07/2016   Seizure-like activity ([place])    Sepsis ([place])    Thyroid disease    Wears dentures     Past Surgical History:  Procedure Laterality Date   AMPUTATION Left 05/21/2015   Procedure: Left Foot 3rd Ray Amputation;  Surgeon: Newt Minion, MD;  Location: [facility];  Service: Orthopedics;  Laterality: Left;   AV FISTULA PLACEMENT Left 03/28/2016   Procedure: ARTERIOVENOUS (AV) FISTULA CREATION LEFT ARM;  Surgeon: Waynetta Sandy, MD;  Location: [place];  Service: Vascular;  Laterality: Left;   AV FISTULA PLACEMENT Right 06/04/2019   Procedure: ARTERIOVENOUS (AV) FISTULA CREATION RIGHT ARM;  Surgeon: Waynetta Sandy, MD;  Location: [place];  Service: Vascular;  Laterality: Right;   AV FISTULA PLACEMENT Left 03/11/2021   Procedure: INSERTION OF LEFT ARM ARTERIOVENOUS (AV) GORE-TEX GRAFT;  Surgeon: Waynetta Sandy, MD;  Location: [facility];  Service: Vascular;  Laterality: Left;   DILATION AND CURETTAGE OF UTERUS     EYE SURGERY     FISTULA SUPERFICIALIZATION Left 09/04/2016  Procedure: FISTULA SUPERFICIALIZATION LEFT UPPER ARM;  Surgeon: Waynetta Sandy, MD;  Location: [place];  Service: Vascular;  Laterality: Left;   FISTULA SUPERFICIALIZATION Right 10/08/2019   Procedure: FISTULA SUPERFICIALIZATION OR RIGHT ARM;  Surgeon: Waynetta Sandy, MD;  Location: [facility];  Service: Vascular;  Laterality: Right;   IR  AV DIALY SHUNT INTRO NEEDLE/INTRAC INITIAL W/PTA/STENT/IMG LT Left 06/13/2017   IR AV DIALY SHUNT INTRO NEEDLE/INTRAC INITIAL W/PTA/STENT/IMG LT Left 09/12/2017   IR AV DIALY SHUNT INTRO NEEDLE/INTRACATH INITIAL W/PTA/IMG LEFT  11/10/2016   IR AV DIALY SHUNT INTRO NEEDLE/INTRACATH INITIAL W/PTA/IMG LEFT  03/09/2017   IR AV DIALY SHUNT INTRO NEEDLE/INTRACATH INITIAL W/PTA/IMG LEFT  12/12/2017   IR AV DIALY SHUNT INTRO NEEDLE/INTRACATH INITIAL W/PTA/IMG LEFT  02/27/2018   IR AV DIALY SHUNT INTRO NEEDLE/INTRACATH INITIAL W/PTA/IMG LEFT  06/05/2018   IR AV DIALY SHUNT INTRO NEEDLE/INTRACATH INITIAL W/PTA/IMG LEFT  09/11/2018   IR AV DIALY SHUNT INTRO NEEDLE/INTRACATH INITIAL W/PTA/IMG LEFT  12/26/2021   IR AV DIALY SHUNT INTRO NEEDLE/INTRACATH INITIAL W/PTA/IMG RIGHT Right 04/21/2020   IR AV DIALY SHUNT INTRO NEEDLE/INTRACATH INITIAL W/PTA/IMG RIGHT Right 10/08/2020   IR DIALY SHUNT INTRO NEEDLE/INTRACATH INITIAL W/IMG LEFT Left 05/08/2018   IR DIALY SHUNT INTRO NEEDLE/INTRACATH INITIAL W/IMG LEFT Left 06/17/2021   IR FLUORO GUIDE CV LINE RIGHT  02/01/2019   IR FLUORO GUIDE CV LINE RIGHT  07/14/2020   IR FLUORO GUIDE CV LINE RIGHT  12/29/2020   IR GENERIC HISTORICAL  08/01/2016   IR FLUORO GUIDE CV LINE RIGHT 08/01/2016 [name], MD [facility]   IR GENERIC HISTORICAL  08/01/2016   IR US GUIDE VASC ACCESS RIGHT 08/01/2016 [name], MD [facility]   IR REMOVAL TUN CV CATH W/O FL  11/01/2016   IR REMOVAL TUN CV CATH W/O FL  01/02/2020   IR REMOVAL TUN CV CATH W/O FL  05/04/2021   IR THROMBECTOMY AV FISTULA W/THROMBOLYSIS INC/SHUNT/IMG LEFT Left 01/31/2019   IR THROMBECTOMY AV FISTULA W/THROMBOLYSIS/PTA INC/SHUNT/IMG LEFT Left 01/27/2019   IR THROMBECTOMY AV FISTULA W/THROMBOLYSIS/PTA INC/SHUNT/IMG LEFT Left 07/16/2020   IR THROMBECTOMY AV FISTULA W/THROMBOLYSIS/PTA/STENT INC/SHUNT/IMG RT Right 09/24/2020   IR US GUIDE VASC ACCESS LEFT  11/10/2016   IR US GUIDE VASC ACCESS LEFT  09/12/2017   IR US GUIDE VASC ACCESS  LEFT  12/12/2017   IR US GUIDE VASC ACCESS LEFT  06/05/2018   IR US GUIDE VASC ACCESS LEFT  09/11/2018   IR US GUIDE VASC ACCESS LEFT  01/27/2019   IR US GUIDE VASC ACCESS LEFT  02/01/2019   IR US GUIDE VASC ACCESS LEFT  12/26/2021   IR US GUIDE VASC ACCESS RIGHT  02/01/2019   IR US GUIDE VASC ACCESS RIGHT  07/14/2020   IR US GUIDE VASC ACCESS RIGHT  07/16/2020   IR US GUIDE VASC ACCESS RIGHT  10/08/2020   IR US GUIDE VASC ACCESS RIGHT  12/29/2020   PERIPHERAL VASCULAR BALLOON ANGIOPLASTY Left 07/22/2021   Procedure: PERIPHERAL VASCULAR BALLOON ANGIOPLASTY;  Surgeon: Cherre Robins, MD;  Location: [place] [facility];  Service: Cardiovascular;  Laterality: Left;   UPPER EXTREMITY VENOGRAPHY Bilateral 02/28/2021   Procedure: UPPER EXTREMITY VENOGRAPHY;  Surgeon: Waynetta Sandy, MD;  Location: [place] [facility];  Service: Cardiovascular;  Laterality: Bilateral;    Prior to Admission medications   Medication Sig Start Date End Date Taking? Authorizing Provider  atorvastatin (LIPITOR) 40 MG tablet TAKE 1 TABLET (40 MG TOTAL) BY MOUTH DAILY. (office visit for  refills) Patient taking differently: Take 40 mg by mouth daily. 11/16/21 11/16/22 Yes Newlin, Charlane Ferretti, MD  DULoxetine (CYMBALTA) 60 MG capsule TAKE 1 CAPSULE (60 MG TOTAL) BY MOUTH DAILY. Patient taking differently: Take 60 mg by mouth daily. 11/16/21 11/16/22 Yes Newlin, Charlane Ferretti, MD  ethyl chloride spray SPRAY 1 SPRAY TO THE SKIN 3 TIMES A WEEK AS NEEDED. SPRAY A SMALL AMOUNT JUST PRIOR TO NEEDLE INSERTION. Patient taking differently: Apply 1 Application topically See admin instructions. Spray a small amount prior to needle insertion 3 times a week as needed 02/10/22  Yes Newlin, [name], MD  gabapentin (NEURONTIN) 300 MG capsule TAKE 1 CAPSULE (300 MG TOTAL) BY MOUTH AT BEDTIME. Patient taking differently: Take 300 mg by mouth at bedtime. 12/26/21 12/26/22 Yes [name], MD  insulin glargine (LANTUS) 100 UNIT/ML injection Inject  subcutaneously twice daily 16 units in the morning and 8 units in the evening Patient taking differently: Inject 6-14 Units into the skin See admin instructions. Inject 14 units in the morning and 6 units in the evening 11/16/21  Yes Newlin, [name], MD  lisinopril (ZESTRIL) 20 MG tablet TAKE 1 TABLET (20 MG TOTAL) BY MOUTH DAILY. Patient taking differently: Take 20 mg by mouth daily. 12/29/21 12/29/22 Yes [name], MD  Nutritional Supplements (FEEDING SUPPLEMENT, NEPRO CARB STEADY,) LIQD Take 237 mLs by mouth See admin instructions. Take 1 can (237 ml) by mouth once daily on Tuesdays, Thursdays & Saturday morning. May take an additional can (237 ml) by mouth if needed for poor appetite.   Yes [provider]    Current Facility-Administered Medications  Medication Dose Route Frequency Provider Last Rate Last Admin   acetaminophen (TYLENOL) tablet 650 mg  650 mg Oral Q6H PRN Etta Quill, DO       Or   acetaminophen (TYLENOL) suppository 650 mg  650 mg Rectal Q6H PRN Etta Quill, DO       atorvastatin (LIPITOR) tablet 40 mg  40 mg Oral Daily Eugenie Filler, MD       cefTRIAXone (ROCEPHIN) 2 g in sodium chloride 0.9 % 100 mL IVPB  2 g Intravenous Q24H Jennette Kettle M, DO   Stopped at 02/19/22 0132   DULoxetine (CYMBALTA) DR capsule 60 mg  60 mg Oral Daily Eugenie Filler, MD       [START ON 02/21/2022] ferric gluconate (FERRLECIT) 125 mg in sodium chloride 0.9 % 100 mL IVPB  125 mg Intravenous Q T,Th,Sa-HD Roney Jaffe, MD       gabapentin (NEURONTIN) capsule 300 mg  300 mg Oral QHS Eugenie Filler, MD       heparin injection 5,000 Units  5,000 Units Subcutaneous Q8H Jennette Kettle M, DO   5,000 Units at 02/18/22 2318   insulin aspart (novoLOG) injection 0-6 Units  0-6 Units Subcutaneous TID WC Eugenie Filler, MD       insulin glargine-yfgn [facility]) injection 8 Units  8 Units Subcutaneous QHS Eugenie Filler, MD       loperamide (IMODIUM) capsule 2 mg  2  mg Oral PRN Etta Quill, DO       metroNIDAZOLE (FLAGYL) IVPB 500 mg  500 mg Intravenous Q12H Jennette Kettle M, DO   Stopped at 02/19/22 0132   ondansetron (ZOFRAN) tablet 4 mg  4 mg Oral Q6H PRN Etta Quill, DO       Or   ondansetron [facility]) injection 4 mg  4 mg Intravenous Q6H PRN Etta Quill, DO  4 mg at 02/19/22 8329   Current Outpatient Medications  Medication Sig Dispense Refill   atorvastatin (LIPITOR) 40 MG tablet TAKE 1 TABLET (40 MG TOTAL) BY MOUTH DAILY. (office visit for refills) (Patient taking differently: Take 40 mg by mouth daily.) 30 tablet 6   DULoxetine (CYMBALTA) 60 MG capsule TAKE 1 CAPSULE (60 MG TOTAL) BY MOUTH DAILY. (Patient taking differently: Take 60 mg by mouth daily.) 30 capsule 6   ethyl chloride spray SPRAY 1 SPRAY TO THE SKIN 3 TIMES A WEEK AS NEEDED. SPRAY A SMALL AMOUNT JUST PRIOR TO NEEDLE INSERTION. (Patient taking differently: Apply 1 Application topically See admin instructions. Spray a small amount prior to needle insertion 3 times a week as needed) 116 mL 2   gabapentin (NEURONTIN) 300 MG capsule TAKE 1 CAPSULE (300 MG TOTAL) BY MOUTH AT BEDTIME. (Patient taking differently: Take 300 mg by mouth at bedtime.) 30 capsule 2   insulin glargine (LANTUS) 100 UNIT/ML injection Inject subcutaneously twice daily 16 units in the morning and 8 units in the evening (Patient taking differently: Inject 6-14 Units into the skin See admin instructions. Inject 14 units in the morning and 6 units in the evening) 30 mL 6   lisinopril (ZESTRIL) 20 MG tablet TAKE 1 TABLET (20 MG TOTAL) BY MOUTH DAILY. (Patient taking differently: Take 20 mg by mouth daily.) 30 tablet 2   Nutritional Supplements (FEEDING SUPPLEMENT, NEPRO CARB STEADY,) LIQD Take 237 mLs by mouth See admin instructions. Take 1 can (237 ml) by mouth once daily on Tuesdays, Thursdays & Saturday morning. May take an additional can (237 ml) by mouth if needed for poor appetite.     Facility-Administered  Medications Ordered in Other Encounters  Medication Dose Route Frequency Provider Last Rate Last Admin   0.9 %  sodium chloride infusion   Intravenous Continuous Monia Sabal, PA-C        Allergies as of 02/18/2022   (No Known Allergies)    History reviewed. No pertinent family history.  Social History   Socioeconomic History   Marital status: Widowed    Spouse name: Not on file   Number of children: 11   Years of education: no schooling   Highest education level: Not on file  Occupational History   Not on file  Tobacco Use   Smoking status: Never   Smokeless tobacco: Never  Vaping Use   Vaping Use: Never used  Substance and Sexual Activity   Alcohol use: Not Currently    Alcohol/week: 0.0 standard drinks of alcohol    Comment: rare- a beer every now and then   Drug use: No   Sexual activity: Not Currently  Other Topics Concern   Not on file  Social History Narrative   Lives with son Marcial    Right handed   Caffeine: every once in a while a little coffee, chamomile tea, or soda   Social Determinants of Health   Financial Resource Strain: Not on file  Food Insecurity: Not on file  Transportation Needs: Not on file  Physical Activity: Not on file  Stress: Not on file  Social Connections: Not on file  Intimate Partner Violence: Not on file    Review of Systems: Pertinent positive and negative review of systems were noted in the above HPI section.  All other review of systems was otherwise negative.   Physical Exam: Vital signs in last 24 hours: Temp:  [97.7 F (36.5 C)-100.1 F (37.8 C)] 100.1 F (37.8 C) (08/20 0731)  Pulse Rate:  [67-82] 82 (08/20 0919) Resp:  [14-22] 18 (08/20 0919) BP: (90-160)/(18-134) 101/63 (08/20 0919) SpO2:  [90 %-100 %] 94 % (08/20 0919)   General:   Alert,  Well-developed, chronically ill-appearing elderly Hispanic female, family at bedside patient is alert, but will not speak, did not really follow directions  appropriately Head:  Normocephalic and atraumatic. Eyes:  Sclera clear, no icterus.   Conjunctiva pink. Ears:  Normal auditory acuity. Nose:  No deformity, discharge,  or lesions. Mouth:  No deformity or lesions.   Neck:  Supple; no masses or thyromegaly. Lungs: Creased breath sounds bilateral bases  heart:  Regular rate and rhythm; no murmurs, clicks, rubs,  or gallops. Abdomen:  Soft,BS active,nonpalp mass or hsm, there is mild rather generalized tenderness, no guarding or rebound Rectal: Not done Msk:  Symmetrical without gross deformities. . Pulses:  Normal pulses noted. Extremities:  Without clubbing or edema. Neurologic:  Alert , moving extremities, nonverbal currently Skin:  Intact without significant lesions or rashes..   Intake/Output from previous day: No intake/output data recorded. Intake/Output this shift: No intake/output data recorded.  Lab Results: Recent Labs    02/18/22 1405  WBC 11.0*  HGB 13.1  HCT 39.5  PLT 221   BMET Recent Labs    02/18/22 1405  NA 136  K 3.5  CL 94*  CO2 29  GLUCOSE 275*  BUN 21  CREATININE 3.22*  CALCIUM 9.4   LFT Recent Labs    02/18/22 1405  PROT 7.1  ALBUMIN 2.7*  AST 46*  ALT 28  ALKPHOS 166*  BILITOT 0.8  BILIDIR 0.3*  IBILI 0.5   PT/INR No results for input(s): "LABPROT", "INR" in the last 72 hours. Hepatitis Panel No results for input(s): "HEPBSAG", "HCVAB", "HEPAIGM", "HEPBIGM" in the last 72 hours.    IMPRESSION:  #56 71 year old non-English-speaking Hispanic female with end-stage renal disease on dialysis, admitted with failure to thrive. Very poor oral intake over the past couple of weeks, no vomiting, has had some intermittent mild diarrhea, no documented fever, per notes may have had some diaphoresis off and on. She has also been nonverbal over most of the past 2 weeks  Work-up thus far has shown elevated troponin and BNP, moderate bilateral effusions, and new Q waves-cardiology on board and  work-up in progress  Plan  is for TTE and continue to trend troponins  Suspect she is volume overloaded  # 2 patulous esophagus on CT-nonspecific and very likely chronic may have underlying presbyesophagus, no definite stricture or other abnormality noted  #3 mild wall thickening of the small bowel and portions of the colon- This may be secondary to volume overload as well  Not certain that she actually has an infectious enterocolitis Stool for C. difficile and GI path panel both negative  #4 history of hypertension, mildly hypotensive here #5 AMS , CT shows chronic changes no acute infarct-toxic metabolic encephalopathy  PLAN: Management is supportive at this time Await further cardiology input Would get bedside swallow eval prior to starting diet, and start with clear liquids if passes swallow eval.  No indication for endoscopic evaluation at this time, once other issues are sorted out, and mental status improves she may need barium swallow.  GI will be available as needed  Grey Rakestraw PA-C 02/19/2022, 10:07 AM

## 2022-02-19 NOTE — Progress Notes (Signed)
Echo attempted. Patient eating. Will attempt again as schedule permits.

## 2022-02-19 NOTE — ED Notes (Signed)
Son updated at bedside.

## 2022-02-19 NOTE — Evaluation (Addendum)
Clinical/Bedside Swallow Evaluation Patient Details  Name: Sarah Kelley MRN: [id number] Date of Birth: July 01, 1951  Today's Date: 02/19/2022 Time: SLP Start Time (ACUTE ONLY): 1211 SLP Stop Time (ACUTE ONLY): 6754 SLP Time Calculation (min) (ACUTE ONLY): 22 min  Past Medical History:  Past Medical History:  Diagnosis Date   Diabetes mellitus    ESRD (end stage renal disease) ([place])    dialysis T/Th/Sa   GERD (gastroesophageal reflux disease)    Hyperlipidemia    Hypertension    Iron deficiency anemia    MRSA infection    hx of   Neuropathy    Diabetic   Pneumonia 07/2016   Seizure-like activity ([place])    Sepsis ([place])    Thyroid disease    Wears dentures    Past Surgical History:  Past Surgical History:  Procedure Laterality Date   AMPUTATION Left 05/21/2015   Procedure: Left Foot 3rd Ray Amputation;  Surgeon: Newt Minion, MD;  Location: [facility];  Service: Orthopedics;  Laterality: Left;   AV FISTULA PLACEMENT Left 03/28/2016   Procedure: ARTERIOVENOUS (AV) FISTULA CREATION LEFT ARM;  Surgeon: Waynetta Sandy, MD;  Location: [facility];  Service: Vascular;  Laterality: Left;   AV FISTULA PLACEMENT Right 06/04/2019   Procedure: ARTERIOVENOUS (AV) FISTULA CREATION RIGHT ARM;  Surgeon: Waynetta Sandy, MD;  Location: [facility];  Service: Vascular;  Laterality: Right;   AV FISTULA PLACEMENT Left 03/11/2021   Procedure: INSERTION OF LEFT ARM ARTERIOVENOUS (AV) GORE-TEX GRAFT;  Surgeon: Waynetta Sandy, MD;  Location: [facility];  Service: Vascular;  Laterality: Left;   DILATION AND CURETTAGE OF UTERUS     EYE SURGERY     FISTULA SUPERFICIALIZATION Left 09/04/2016   Procedure: FISTULA SUPERFICIALIZATION LEFT UPPER ARM;  Surgeon: Waynetta Sandy, MD;  Location: [facility];  Service: Vascular;  Laterality: Left;   FISTULA SUPERFICIALIZATION Right 10/08/2019   Procedure: FISTULA SUPERFICIALIZATION OR RIGHT ARM;  Surgeon: Waynetta Sandy, MD;  Location:  [facility];  Service: Vascular;  Laterality: Right;   IR AV DIALY SHUNT INTRO NEEDLE/INTRAC INITIAL W/PTA/STENT/IMG LT Left 06/13/2017   IR AV DIALY SHUNT INTRO NEEDLE/INTRAC INITIAL W/PTA/STENT/IMG LT Left 09/12/2017   IR AV DIALY SHUNT INTRO NEEDLE/INTRACATH INITIAL W/PTA/IMG LEFT  11/10/2016   IR AV DIALY SHUNT INTRO NEEDLE/INTRACATH INITIAL W/PTA/IMG LEFT  03/09/2017   IR AV DIALY SHUNT INTRO NEEDLE/INTRACATH INITIAL W/PTA/IMG LEFT  12/12/2017   IR AV DIALY SHUNT INTRO NEEDLE/INTRACATH INITIAL W/PTA/IMG LEFT  02/27/2018   IR AV DIALY SHUNT INTRO NEEDLE/INTRACATH INITIAL W/PTA/IMG LEFT  06/05/2018   IR AV DIALY SHUNT INTRO NEEDLE/INTRACATH INITIAL W/PTA/IMG LEFT  09/11/2018   IR AV DIALY SHUNT INTRO NEEDLE/INTRACATH INITIAL W/PTA/IMG LEFT  12/26/2021   IR AV DIALY SHUNT INTRO NEEDLE/INTRACATH INITIAL W/PTA/IMG RIGHT Right 04/21/2020   IR AV DIALY SHUNT INTRO NEEDLE/INTRACATH INITIAL W/PTA/IMG RIGHT Right 10/08/2020   IR DIALY SHUNT INTRO NEEDLE/INTRACATH INITIAL W/IMG LEFT Left 05/08/2018   IR DIALY SHUNT INTRO NEEDLE/INTRACATH INITIAL W/IMG LEFT Left 06/17/2021   IR FLUORO GUIDE CV LINE RIGHT  02/01/2019   IR FLUORO GUIDE CV LINE RIGHT  07/14/2020   IR FLUORO GUIDE CV LINE RIGHT  12/29/2020   IR GENERIC HISTORICAL  08/01/2016   IR FLUORO GUIDE CV LINE RIGHT 08/01/2016 [name], MD [facility]   IR GENERIC HISTORICAL  08/01/2016   IR US GUIDE VASC ACCESS RIGHT 08/01/2016 [name], MD [facility]   IR REMOVAL TUN CV CATH W/O [facility]  11/01/2016   IR  REMOVAL TUN CV CATH W/O [facility]  01/02/2020   IR REMOVAL TUN CV CATH W/O [place]  05/04/2021   IR THROMBECTOMY AV FISTULA W/THROMBOLYSIS INC/SHUNT/IMG LEFT Left 01/31/2019   IR THROMBECTOMY AV FISTULA W/THROMBOLYSIS/PTA INC/SHUNT/IMG LEFT Left 01/27/2019   IR THROMBECTOMY AV FISTULA W/THROMBOLYSIS/PTA INC/SHUNT/IMG LEFT Left 07/16/2020   IR THROMBECTOMY AV FISTULA W/THROMBOLYSIS/PTA/STENT INC/SHUNT/IMG RT Right 09/24/2020   IR US GUIDE VASC ACCESS LEFT  11/10/2016   IR US GUIDE  VASC ACCESS LEFT  09/12/2017   IR US GUIDE VASC ACCESS LEFT  12/12/2017   IR US GUIDE VASC ACCESS LEFT  06/05/2018   IR US GUIDE VASC ACCESS LEFT  09/11/2018   IR US GUIDE VASC ACCESS LEFT  01/27/2019   IR US GUIDE VASC ACCESS LEFT  02/01/2019   IR US GUIDE VASC ACCESS LEFT  12/26/2021   IR US GUIDE VASC ACCESS RIGHT  02/01/2019   IR US GUIDE VASC ACCESS RIGHT  07/14/2020   IR US GUIDE VASC ACCESS RIGHT  07/16/2020   IR US GUIDE VASC ACCESS RIGHT  10/08/2020   IR US GUIDE VASC ACCESS RIGHT  12/29/2020   PERIPHERAL VASCULAR BALLOON ANGIOPLASTY Left 07/22/2021   Procedure: PERIPHERAL VASCULAR BALLOON ANGIOPLASTY;  Surgeon: Cherre Robins, MD;  Location: [facility];  Service: Cardiovascular;  Laterality: Left;   UPPER EXTREMITY VENOGRAPHY Bilateral 02/28/2021   Procedure: UPPER EXTREMITY VENOGRAPHY;  Surgeon: Waynetta Sandy, MD;  Location: [place] [facility];  Service: Cardiovascular;  Laterality: Bilateral;   HPI:  Pt is a 71 y.o. female who presented secondary to poor p.o. intake. Per H&P, pt "feels like food gets stuck in throat". Suspected to be esophageal and GI consulted, but SLP also consulted for oropharyngeal assessment. CT  mild diffuse small bowel wall thickening, no obstruction, colonic wall thickening with mild pericolonic edema involving the transverse colon, patulous esophagus. GI rx NPO status until BSE completed, and supportive management without plan for endoscopy. Possible barium swallow once mental status improves. PMH: ESRD on TTS dialysis, HTN, HLD, GERD, DM2 on insulin.    Assessment / Plan / Recommendation  Clinical Impression  Pt was seen for bedside swallow evaluation with her granddaughter and daughter present. [employer] Spanish interpreter, Ophelia Shoulder (ID# [id number]), was used for translation. Pt's son was contacted via phone and pt's granddaughter provided translation. The reports of pt's family were somewhat variable with initial report of solids and liquids  "sticking" when she swallows, and pt "randomly" coughing during meals, but not having significant globus sensation. Oral mechanism exam was limited due to pt's difficulty following commands but oral motor strength appeared grossly WFL and dentition was reduced. Pt exhibited mildly prolonged mastication which family reported was baseline, but she tolerated all solids and liquids without signs or symptoms of oropharyngeal dysphagia. A clear liquid diet will be initiated at this time per GI's recommendation; pt's diet may be advanced further pending GI clearance. Acute skilled SLP services will be discontinued at this time, but please reconsult if pt subsequently becomes symptomatic of oropharyngeal dysfunction. SLP Visit Diagnosis: Dysphagia, unspecified (R13.10)    Aspiration Risk  Mild aspiration risk    Diet Recommendation Thin liquid (clear liquids with advancement per GI)   Liquid Administration via: Cup;Straw Medication Administration: Whole meds with liquid (or with puree as tolerated) Supervision: Full supervision/cueing for compensatory strategies Compensations: Slow rate;Small sips/bites Postural Changes: Seated upright at 90 degrees    Other  Recommendations Recommended Consults: Consider esophageal assessment Oral Care Recommendations: Oral  care BID    Recommendations for follow up therapy are one component of a multi-disciplinary discharge planning process, led by the attending physician.  Recommendations may be updated based on patient status, additional functional criteria and insurance authorization.  Follow up Recommendations No SLP follow up      Assistance Recommended at Discharge    Functional Status Assessment    Frequency and Duration            Prognosis Prognosis for Safe Diet Advancement: Good      Swallow Study   General Date of Onset: 02/18/22 HPI: Pt is a 71 y.o. female who presented secondary to poor p.o. intake. Per H&P, pt "feels like food gets stuck  in throat". Suspected to be esophageal and GI consulted, but SLP also consulted for oropharyngeal assessment. CT  mild diffuse small bowel wall thickening, no obstruction, colonic wall thickening with mild pericolonic edema involving the transverse colon, patulous esophagus. GI rx NPO status until BSE completed, and supportive management without plan for endoscopy. Possible barium swallow once mental status improves. PMH: ESRD on TTS dialysis, HTN, HLD, GERD, DM2 on insulin. Type of Study: Bedside Swallow Evaluation Previous Swallow Assessment: none Diet Prior to this Study: NPO Temperature Spikes Noted: No Respiratory Status: Room air History of Recent Intubation: No Behavior/Cognition: Alert;Cooperative;Pleasant mood Oral Cavity Assessment: Within Functional Limits Oral Care Completed by SLP: No Oral Cavity - Dentition: Adequate natural dentition Vision: Functional for self-feeding Self-Feeding Abilities: Needs assist Patient Positioning: Upright in bed;Postural control adequate for testing Baseline Vocal Quality: Not observed Volitional Cough: Weak    Oral/Motor/Sensory Function Overall Oral Motor/Sensory Function: Within functional limits   Ice Chips Ice chips: Within functional limits Presentation: Spoon   Thin Liquid Thin Liquid: Within functional limits Presentation: Straw;Cup    Nectar Thick Nectar Thick Liquid: Not tested   Honey Thick Honey Thick Liquid: Not tested   Puree Puree: Within functional limits Presentation: Spoon   Solid     Solid: Impaired Presentation: Spoon Oral Phase Impairments: Impaired mastication     Colum Colt I. Hardin Negus, [place], [place] Office number [place] 02/19/2022,12:59 PM

## 2022-02-19 NOTE — Progress Notes (Signed)
PROGRESS NOTE    Sarah Kelley  HTD:[id number] DOB: [birth year] DOA: 02/18/2022 PCP: [name], MD    Chief Complaint  Patient presents with   Failure To Thrive    Brief Narrative:  HPI per Dr. Johnathan Hausen [place] is a 71 y.o. female with medical history significant of ESRD on TTS dialysis, last had dialysis earlier today.  HTN, HLD, DM2 on insulin.   Pt presents to ED with 2 week h/o reduced PO intake, diaphoresis over past 3-4 days and profuse diarrhea.   Per PT: feels like food gets stuck in throat.   No CP, SOB.   No sick contacts in family, no one else in family is sick.    Assessment & Plan:  Principal Problem:   Enterocolitis Active Problems:   Dysphagia   ESRD (end stage renal disease) (HCC)   Elevated troponin   Essential hypertension   Type 2 diabetes mellitus with chronic kidney disease on chronic dialysis, with long-term current use of insulin (HCC)   Hypothyroidism    Assessment and Plan: * Enterocolitis Presumed infectious, with frequent diarrhea and decreased PO intake. GI pathogen pnl negative. C.Diff neg CT abdomen and pelvis concerning for colitis.  Continue empiric IV Rocephin and Flagyl.  See ESRD below regarding fluid status  Dysphagia Probably the underlying cause of reduced PO intake, even more so than the GI illness. Fluid filled esophagus on CT noted SLP eval for swallowing eval But sounds like it might be more esophageal phase than oropharyngeal phase Consult GI for further evaluation and management.  We will change patient's diet to n.p.o. as patient noted to have a bout of emesis after receiving some broth this morning per RN and family at bedside.   Elevated troponin Cards consulting on patient They indicate that this is likely demand ischemia Trend trops for now 2D echo from 02/13/2018 with EF of 55 to 72%,IOMB, grade 1 diastolic dysfunction. Repeat 2D echo.  ESRD (end stage renal disease)  ([place]) Appears to be possibly volume overloaded with mod B pleural effusions on CT scan despite recent GI illness with diarrhea, poor PO intake, and dialysis on 02/18/2022 prior to presentation in the ED.Marland Kitchen Despite this no symptoms of volume overload, so B effusions may be very chronic. Patient noted to have received a 1 L bolus in the ED we will monitor blood pressure and if drops further we will give a 250 cc bolus of normal saline x1 due to ESRD.  BNP elevated at 1707.5.  Consult with nephrology.   Hypothyroidism Mildly low TSH today on labs Checking T4 to confirm Start synthroid if confirmed. Looks like "thyroid disease" is on her [place] list, but I dont see where shes on any sort of thyroid hormone replacement.   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 -CBG 98 this morning.   -Change CBGs to every 6 hours.   -Continue Semglee 8 units daily, SSI.   Essential hypertension Soft BPs, holding home BP meds.         DVT prophylaxis: Heparin Code Status: Full Family Communication: Updated patient, daughter and granddaughter at bedside. Disposition: TBD  Status is: Observation The patient remains OBS appropriate and will d/c before 2 midnights.   Consultants:  Nephrology: Dr.Schertz 02/19/2022 Gastroenterology: Dr. Tarri Glenn 02/19/2022   Procedures:  Central line placement per EDP Dr.Dixon 02/19/2022 CT chest abdomen and pelvis 02/18/2022 Chest x-ray 02/18/2022  Antimicrobials:  IV Rocephin 02/18/2022>>>> IV Flagyl 02/18/2022>>>>   Subjective: Non verbal per family and just shakes her head to answer yes or no which has been ongoing for a while.  However chest prior to admission patient stopped shaking her head to answer yes or no and was more drowsy per family.. Just had episode of emesis in ED per RN and family after broth given. Family unable to give a history at  bedside as they state they do not live with patient.  Objective: Vitals:   02/19/22 1249 02/19/22 1315 02/19/22 1345 02/19/22 1422  BP:  (!) 102/54 (!) 93/41 (!) 94/33  Pulse:  80 72 69  Resp:  17 18 20   Temp:      TempSrc:      SpO2: 94% 97% 94% 93%   No intake or output data in the 24 hours ending 02/19/22 1456 There were no vitals filed for this visit.  Examination:  General exam: Drowsy. Respiratory system: Clear to auscultation anterior lung fields.  No wheezes, no crackles, no rhonchi.Marland Kitchen Respiratory effort normal. Cardiovascular system: S1 & S2 heard, RRR. No JVD, murmurs, rubs, gallops or clicks. No pedal edema. Gastrointestinal system: Abdomen is nondistended, soft and nontender. No organomegaly or masses felt. Normal bowel sounds heard. Central nervous system: Drowsy but arousable. No focal neurological deficits. Extremities: Symmetric 5 x 5 power. Skin: No rashes, lesions or ulcers Psychiatry: Judgement and insight unable to assess. Mood & affect appropriate.     Data Reviewed:   CBC: Recent Labs  Lab 02/18/22 1405 02/19/22 1157  WBC 11.0* 9.6  HGB 13.1 11.1*  HCT 39.5 33.8*  MCV 99.5 100.0  PLT 221 176    Basic Metabolic Panel: Recent Labs  Lab 02/18/22 1405 02/18/22 1453 02/19/22 1157  NA 136  --  138  K 3.5  --  3.6  CL 94*  --  98  CO2 29  --  27  GLUCOSE 275*  --  193*  BUN 21  --  27*  CREATININE 3.22*  --  4.28*  CALCIUM 9.4  --  9.8  MG  --  2.1  --   PHOS  --   --  5.3*    GFR: CrCl cannot be calculated (Unknown ideal weight.).  Liver Function Tests: Recent Labs  Lab 02/18/22 1405 02/19/22 1157  AST 46* 38  ALT 28 20  ALKPHOS 166* 126  BILITOT 0.8 0.9  PROT 7.1 6.1*  ALBUMIN 2.7* 2.3*    CBG: Recent Labs  Lab 02/18/22 2303 02/19/22 0728 02/19/22 1139  GLUCAP 204* 98 181*     Recent Results (from the past 240 hour(s))  C Difficile Quick Screen w PCR reflex     Status: None   Collection Time: 02/18/22  5:41 PM    Specimen: STOOL  Result Value Ref Range Status   C Diff antigen NEGATIVE NEGATIVE Final   C Diff toxin NEGATIVE NEGATIVE Final   C Diff interpretation No C. difficile detected.  Final    Comment: Performed at [facility], [place] [address], [place], [place] [address]  Gastrointestinal Panel by PCR , Stool     Status: None   Collection Time: 02/18/22  5:42 PM   Specimen: STOOL  Result Value Ref Range Status   Campylobacter species NOT DETECTED NOT DETECTED Final   Plesimonas shigelloides NOT DETECTED NOT DETECTED Final   Salmonella species NOT DETECTED NOT DETECTED Final   Yersinia enterocolitica NOT DETECTED NOT DETECTED Final   Vibrio  species NOT DETECTED NOT DETECTED Final   Vibrio cholerae NOT DETECTED NOT DETECTED Final   Enteroaggregative E coli (EAEC) NOT DETECTED NOT DETECTED Final   Enteropathogenic E coli (EPEC) NOT DETECTED NOT DETECTED Final   Enterotoxigenic E coli (ETEC) NOT DETECTED NOT DETECTED Final   Shiga like toxin producing E coli (STEC) NOT DETECTED NOT DETECTED Final   Shigella/Enteroinvasive E coli (EIEC) NOT DETECTED NOT DETECTED Final   Cryptosporidium NOT DETECTED NOT DETECTED Final   Cyclospora cayetanensis NOT DETECTED NOT DETECTED Final   Entamoeba histolytica NOT DETECTED NOT DETECTED Final   Giardia lamblia NOT DETECTED NOT DETECTED Final   Adenovirus F40/41 NOT DETECTED NOT DETECTED Final   Astrovirus NOT DETECTED NOT DETECTED Final   Norovirus GI/GII NOT DETECTED NOT DETECTED Final   Rotavirus A NOT DETECTED NOT DETECTED Final   Sapovirus (I, II, IV, and V) NOT DETECTED NOT DETECTED Final    Comment: Performed at [facility], [address] [place], [place] [address]         Radiology Studies: CT CHEST ABDOMEN PELVIS W CONTRAST  Result Date: 02/18/2022 CLINICAL DATA:  Sepsis. Diminished appetite for 2 weeks. Increased sweating. EXAM: CT CHEST, ABDOMEN, AND PELVIS WITH CONTRAST TECHNIQUE: Multidetector CT imaging of the chest,  abdomen and pelvis was performed following the standard protocol during bolus administration of intravenous contrast. RADIATION DOSE REDUCTION: This exam was performed according to the departmental dose-optimization program which includes automated exposure control, adjustment of the mA and/or kV according to patient size and/or use of iterative reconstruction technique. CONTRAST:  28mL OMNIPAQUE IOHEXOL 300 MG/ML  SOLN COMPARISON:  Chest radiograph earlier today. Abdominopelvic CT 02/16/2018 FINDINGS: CT CHEST FINDINGS Cardiovascular: Mild cardiomegaly. There are coronary artery and pericardial calcifications. No pericardial effusion. Moderate atherosclerosis of the thoracic aorta without evidence of acute aortic finding. Vascular stent arising from the left subclavian artery coursing superiorly is only partially included in the field of view, but may be occluded, image 1 series 3. There is no obvious pulmonary embolus in the central most pulmonary arteries, although assessment for pulmonary embolus is limited. Mediastinum/Nodes: Scattered small mediastinal lymph nodes, not enlarged by size criteria. There are mildly prominent bilateral hilar nodes. The esophagus is patulous and contains scattered intraluminal fluid. No wall thickening. No thyroid nodule. Lungs/Pleura: Moderate layering right pleural effusion with associated compressive atelectasis. Moderate left pleural effusion is partially loculated and courses into the fissure. There is associated compressive atelectasis. Calcified granuloma in the right upper lobe. Mild hazy ground-glass opacity in the dependent lungs. Mild left lower lobe bronchial thickening with occasional mucous plugging. No endobronchial lesion or debris in the more proximal bronchi. Musculoskeletal: Scattered Schmorl's nodes throughout the thoracic spine. There are no acute or suspicious osseous abnormalities. CT ABDOMEN PELVIS FINDINGS Hepatobiliary: No focal hepatic abnormality.  Gallbladder physiologically distended, no calcified stone. No biliary dilatation. Pancreas: No ductal dilatation or inflammation. Spleen: Normal in size without focal abnormality. Adrenals/Urinary Tract: No adrenal nodule. Sequela of chronic renal disease with native renal atrophy. No hydronephrosis or renal inflammation. The urinary bladder is essentially empty, but thick walled. Stomach/Bowel: Fluid within the stomach, no definite gastric wall thickening. Occasional fluid-filled loops of small bowel. There is suggestion of mild diffuse small bowel wall thickening. No obstruction. There is colonic wall thickening with mild pericolonic edema involving the transverse colon. Possible additional areas of colonic inflammation in the sigmoid. No bowel pneumatosis. The appendix is not visualized. Vascular/Lymphatic: Advanced aortic atherosclerosis. Moderate atherosclerosis of aortic branches. No  evidence of embolic disease. There may be areas of stenosis of the distal SMA related to atheromatous plaque. No bulky adenopathy. Reproductive: Diffuse uterine vascular calcifications. No adnexal mass. Other: Lower abdominal ventral abdominal wall hernia contains only fat. Small right inguinal hernia contains small amount of free fluid. No free air or ascites. No abdominopelvic collection. Musculoskeletal: Vague sclerotic focus in the right iliac bone is unchanged. Remote right posterior lower rib fracture. IMPRESSION: 1. Colonic wall thickening with mild pericolonic edema involving the transverse colon, suspicious for colitis. Possible additional areas of colonic inflammation in the sigmoid colon. There is also mild diffuse small bowel wall thickening small possible enteritis. 2. Moderate layering right pleural effusion with associated compressive atelectasis. Moderate left pleural effusion is partially loculated and courses into the fissure. 3. Mild hazy ground-glass opacity in the dependent lungs, atelectasis or mild pulmonary  edema. 4. Patulous esophagus with scattered intraluminal fluid, suggesting reflux. 5. Thick-walled urinary bladder, chronic. 6. Additional chronic findings are stable as described. Aortic Atherosclerosis (ICD10-I70.0). Electronically Signed   By: Keith Rake M.D.   On: 02/18/2022 21:22   DG Chest 2 View  Result Date: 02/18/2022 CLINICAL DATA:  Not eating.  Wheezing. EXAM: CHEST - 2 VIEW COMPARISON:  09/06/2019. FINDINGS: Cardiac silhouette is mildly enlarged. No mediastinal or hilar masses. No evidence of adenopathy. Small effusions. Mild lung base opacity consistent with atelectasis. Remainder of the lungs is clear. No pneumothorax. Stable left subclavian to axillary stent. Skeletal structures are intact. IMPRESSION: 1. No acute cardiopulmonary disease. 2. Small effusions with mild basilar atelectasis. No convincing pneumonia or pulmonary edema. Electronically Signed   By: Lajean Manes M.D.   On: 02/18/2022 16:23   CT Head Wo Contrast  Result Date: 02/18/2022 CLINICAL DATA:  Mental status change, unknown cause EXAM: CT HEAD WITHOUT CONTRAST TECHNIQUE: Contiguous axial images were obtained from the base of the skull through the vertex without intravenous contrast. RADIATION DOSE REDUCTION: This exam was performed according to the departmental dose-optimization program which includes automated exposure control, adjustment of the mA and/or kV according to patient size and/or use of iterative reconstruction technique. COMPARISON:  Head CT 07/15/2020 FINDINGS: Brain: Stable degree of atrophy and chronic small vessel ischemia from prior exam. Remote lacunar infarcts in the right external capsule. Remote infarct in the right cerebellum and high right parietal lobe. No evidence of acute ischemia. No hemorrhage or subdural collection. No hydrocephalus. No midline shift or mass effect. Enlarged partially empty sella, unchanged. Vascular: Atherosclerosis of skullbase vasculature without hyperdense vessel or  abnormal calcification. Skull: No fracture or focal lesion. Sinuses/Orbits: Chronic mucosal thickening of ethmoid air cells with frothy debris in the sphenoid sinus. Postsurgical change in both globes. Other: None. IMPRESSION: 1. No acute intracranial abnormality. 2. Stable atrophy, chronic small vessel ischemia, and remote infarcts. Electronically Signed   By: Keith Rake M.D.   On: 02/18/2022 16:22        Scheduled Meds:  heparin  5,000 Units Subcutaneous Q8H   insulin aspart  0-6 Units Subcutaneous Q6H   insulin glargine-yfgn  8 Units Subcutaneous QHS   Continuous Infusions:  cefTRIAXone (ROCEPHIN)  IV Stopped (02/19/22 0132)   [START ON 02/21/2022] ferric gluconate (FERRLECIT) IVPB     metronidazole Stopped (02/19/22 1155)   sodium chloride       LOS: 0 days    Time spent: 40 minutes    Irine Seal, MD [facility]   To contact the attending provider between 7A-7P or the covering provider during after  hours 7P-7A, please log into the web site [email or web address] and access using universal [place] password for that web site. If you do not have the password, please call the hospital operator.  02/19/2022, 2:56 PM

## 2022-02-19 NOTE — Consult Note (Signed)
Renal Service Consult Note [facility]  [place] 02/19/2022 Sol Blazing, MD Requesting Physician: Dr. Grandville Silos  Reason for Consult: ESRD pt w/  HPI: The patient is a 71 y.o. year-old w/ hx of DM2, ESRD on HD tts, HTN, HL, anemia who presented to ED w/ c/o 2 weeks of poor po intake, sweats x 3-4 days and profuse diarrhea. No SOB, CP. No sick contacts. Her last HD was yesterday. In ED CT abd showed mild pericolonic edema c/w colitis and some SB wall thickening c/w enteritis. Also mild GG changes in dependent lungs w/ moderate sized L pleural effusion. Pt admitted for enterocolitis. We are asked to see for ESRD and possible vol overload.   Pt seen in ED room. Family at bedside acts as interpreter. Pt has been having bouts of confusion for the last 1-2 years off and on, comes and goes. Today she is confused and speaking minimally. No real hx obtained. Per family has diarrhea off and on, worse the last 2 days. Has nto missed HD.   ROS - denies CP, no joint pain, no HA, no blurry vision, no rash, no diarrhea, no nausea/ vomiting, no dysuria, no difficulty voiding   Past Medical History  Past Medical History:  Diagnosis Date   Diabetes mellitus    ESRD (end stage renal disease) ([place])    dialysis T/Th/Sa   GERD (gastroesophageal reflux disease)    Hyperlipidemia    Hypertension    Iron deficiency anemia    MRSA infection    hx of   Neuropathy    Diabetic   Pneumonia 07/2016   Renal disorder    Seizure-like activity ([place])    Sepsis ([place])    Thyroid disease    Wears dentures    Past Surgical History  Past Surgical History:  Procedure Laterality Date   AMPUTATION Left 05/21/2015   Procedure: Left Foot 3rd Ray Amputation;  Surgeon: Newt Minion, MD;  Location: [place];  Service: Orthopedics;  Laterality: Left;   AV FISTULA PLACEMENT Left 03/28/2016   Procedure: ARTERIOVENOUS (AV) FISTULA CREATION LEFT ARM;  Surgeon: Waynetta Sandy, MD;   Location: [place];  Service: Vascular;  Laterality: Left;   AV FISTULA PLACEMENT Right 06/04/2019   Procedure: ARTERIOVENOUS (AV) FISTULA CREATION RIGHT ARM;  Surgeon: Waynetta Sandy, MD;  Location: [place];  Service: Vascular;  Laterality: Right;   AV FISTULA PLACEMENT Left 03/11/2021   Procedure: INSERTION OF LEFT ARM ARTERIOVENOUS (AV) GORE-TEX GRAFT;  Surgeon: Waynetta Sandy, MD;  Location: [place];  Service: Vascular;  Laterality: Left;   DILATION AND CURETTAGE OF UTERUS     EYE SURGERY     FISTULA SUPERFICIALIZATION Left 09/04/2016   Procedure: FISTULA SUPERFICIALIZATION LEFT UPPER ARM;  Surgeon: Waynetta Sandy, MD;  Location: [place];  Service: Vascular;  Laterality: Left;   FISTULA SUPERFICIALIZATION Right 10/08/2019   Procedure: FISTULA SUPERFICIALIZATION OR RIGHT ARM;  Surgeon: Waynetta Sandy, MD;  Location: [place];  Service: Vascular;  Laterality: Right;   IR AV DIALY SHUNT INTRO NEEDLE/INTRAC INITIAL W/PTA/STENT/IMG LT Left 06/13/2017   IR AV DIALY SHUNT INTRO NEEDLE/INTRAC INITIAL W/PTA/STENT/IMG LT Left 09/12/2017   IR AV DIALY SHUNT INTRO NEEDLE/INTRACATH INITIAL W/PTA/IMG LEFT  11/10/2016   IR AV DIALY SHUNT INTRO NEEDLE/INTRACATH INITIAL W/PTA/IMG LEFT  03/09/2017   IR AV DIALY SHUNT INTRO NEEDLE/INTRACATH INITIAL W/PTA/IMG LEFT  12/12/2017   IR AV DIALY SHUNT INTRO NEEDLE/INTRACATH INITIAL W/PTA/IMG LEFT  02/27/2018   IR AV  DIALY SHUNT INTRO NEEDLE/INTRACATH INITIAL W/PTA/IMG LEFT  06/05/2018   IR AV DIALY SHUNT INTRO NEEDLE/INTRACATH INITIAL W/PTA/IMG LEFT  09/11/2018   IR AV DIALY SHUNT INTRO NEEDLE/INTRACATH INITIAL W/PTA/IMG LEFT  12/26/2021   IR AV DIALY SHUNT INTRO NEEDLE/INTRACATH INITIAL W/PTA/IMG RIGHT Right 04/21/2020   IR AV DIALY SHUNT INTRO NEEDLE/INTRACATH INITIAL W/PTA/IMG RIGHT Right 10/08/2020   IR DIALY SHUNT INTRO NEEDLE/INTRACATH INITIAL W/IMG LEFT Left 05/08/2018   IR DIALY SHUNT INTRO NEEDLE/INTRACATH INITIAL W/IMG LEFT Left 06/17/2021    IR FLUORO GUIDE CV LINE RIGHT  02/01/2019   IR FLUORO GUIDE CV LINE RIGHT  07/14/2020   IR FLUORO GUIDE CV LINE RIGHT  12/29/2020   IR GENERIC HISTORICAL  08/01/2016   IR FLUORO GUIDE CV LINE RIGHT 08/01/2016 [name], MD [facility]   IR GENERIC HISTORICAL  08/01/2016   IR US GUIDE VASC ACCESS RIGHT 08/01/2016 [name], MD [facility]   IR REMOVAL TUN CV CATH W/O FL  11/01/2016   IR REMOVAL TUN CV CATH W/O FL  01/02/2020   IR REMOVAL TUN CV CATH W/O FL  05/04/2021   IR THROMBECTOMY AV FISTULA W/THROMBOLYSIS INC/SHUNT/IMG LEFT Left 01/31/2019   IR THROMBECTOMY AV FISTULA W/THROMBOLYSIS/PTA INC/SHUNT/IMG LEFT Left 01/27/2019   IR THROMBECTOMY AV FISTULA W/THROMBOLYSIS/PTA INC/SHUNT/IMG LEFT Left 07/16/2020   IR THROMBECTOMY AV FISTULA W/THROMBOLYSIS/PTA/STENT INC/SHUNT/IMG RT Right 09/24/2020   IR US GUIDE VASC ACCESS LEFT  11/10/2016   IR US GUIDE VASC ACCESS LEFT  09/12/2017   IR US GUIDE VASC ACCESS LEFT  12/12/2017   IR US GUIDE VASC ACCESS LEFT  06/05/2018   IR US GUIDE VASC ACCESS LEFT  09/11/2018   IR US GUIDE VASC ACCESS LEFT  01/27/2019   IR US GUIDE VASC ACCESS LEFT  02/01/2019   IR US GUIDE VASC ACCESS LEFT  12/26/2021   IR US GUIDE VASC ACCESS RIGHT  02/01/2019   IR US GUIDE VASC ACCESS RIGHT  07/14/2020   IR US GUIDE VASC ACCESS RIGHT  07/16/2020   IR US GUIDE VASC ACCESS RIGHT  10/08/2020   IR US GUIDE VASC ACCESS RIGHT  12/29/2020   PERIPHERAL VASCULAR BALLOON ANGIOPLASTY Left 07/22/2021   Procedure: PERIPHERAL VASCULAR BALLOON ANGIOPLASTY;  Surgeon: Cherre Robins, MD;  Location: [place] [facility];  Service: Cardiovascular;  Laterality: Left;   UPPER EXTREMITY VENOGRAPHY Bilateral 02/28/2021   Procedure: UPPER EXTREMITY VENOGRAPHY;  Surgeon: Waynetta Sandy, MD;  Location: [place] [facility];  Service: Cardiovascular;  Laterality: Bilateral;   Family History History reviewed. No pertinent family history. Social History  reports that she has never smoked. She has never used smokeless  tobacco. She reports that she does not currently use alcohol. She reports that she does not use drugs. Allergies No Known Allergies Home medications Prior to Admission medications   Medication Sig Start Date End Date Taking? Authorizing Provider  atorvastatin (LIPITOR) 40 MG tablet TAKE 1 TABLET (40 MG TOTAL) BY MOUTH DAILY. (office visit for refills) Patient taking differently: Take 40 mg by mouth daily. 11/16/21 11/16/22 Yes Newlin, Charlane Ferretti, MD  DULoxetine (CYMBALTA) 60 MG capsule TAKE 1 CAPSULE (60 MG TOTAL) BY MOUTH DAILY. Patient taking differently: Take 60 mg by mouth daily. 11/16/21 11/16/22 Yes Newlin, Charlane Ferretti, MD  ethyl chloride spray SPRAY 1 SPRAY TO THE SKIN 3 TIMES A WEEK AS NEEDED. SPRAY A SMALL AMOUNT JUST PRIOR TO NEEDLE INSERTION. Patient taking differently: Apply 1 Application topically See admin instructions. Spray a small amount prior to needle insertion  3 times a week as needed 02/10/22  Yes Newlin, [name], MD  gabapentin (NEURONTIN) 300 MG capsule TAKE 1 CAPSULE (300 MG TOTAL) BY MOUTH AT BEDTIME. Patient taking differently: Take 300 mg by mouth at bedtime. 12/26/21 12/26/22 Yes [name], MD  insulin glargine (LANTUS) 100 UNIT/ML injection Inject subcutaneously twice daily 16 units in the morning and 8 units in the evening Patient taking differently: Inject 6-14 Units into the skin See admin instructions. Inject 14 units in the morning and 6 units in the evening 11/16/21  Yes Newlin, [name], MD  lisinopril (ZESTRIL) 20 MG tablet TAKE 1 TABLET (20 MG TOTAL) BY MOUTH DAILY. Patient taking differently: Take 20 mg by mouth daily. 12/29/21 12/29/22 Yes [name], MD  Nutritional Supplements (FEEDING SUPPLEMENT, NEPRO CARB STEADY,) LIQD Take 237 mLs by mouth See admin instructions. Take 1 can (237 ml) by mouth once daily on Tuesdays, Thursdays & Saturday morning. May take an additional can (237 ml) by mouth if needed for poor appetite.   Yes [provider]      Vitals:   02/19/22 0630 02/19/22 0645 02/19/22 0700 02/19/22 0731  BP: (!) 106/40 (!) 109/51 (!) 128/37 (!) 126/54  Pulse: 78 76 74 79  Resp: 18 15 15 16   Temp:    100.1 F (37.8 C)  TempSrc:    Axillary  SpO2: 98% 97% 98% 94%   Exam Gen is lethargic, eyes open, minimally verbal No rash, cyanosis or gangrene Sclera anicteric, throat clear  No jvd or bruits Chest clear bilat to bases, no rales/ wheezing RRR no MRG Abd soft ntnd no mass or ascites +bs GU defer MS no joint effusions or deformity Ext no LE or UE edema, no wounds or ulcers Neuro as above    LUA AVG+bruit   Home meds include - atorvastatin, duloxetine, gabapentin, insulin glargine, lisinopril 20 qd, nepro carb steady   OP HD: [facility] 4h  400/500  52kg  2/2 baht  P2  Heparin 2000 LUA AVG - hep B labs here: pend - last HD 8/19, post wt 53.3kg, last wk edw lowered 3kg - last Hb 11.8 on 8/15, tsat 26%, no esa - iron sucrose IV 100 q hd thru 8/26 - doxercalciferol 5 ug IV tiw    Na 136  K 3.5  CO2 29  BUN 21  creat 3.2  Mg 2.1 Alb 2.7 LFT's ok     BNP 1707  trop 374   WBC 11K Hb 13    Assessment/ Plan: Enterocolitis - w/ diarrhea , poor po intake for 2 weeks. Started on IV abx here, GI path panel pending and Cdif as well.  AMS - some of this is chronic, possibly dementia. Not uremia given good HD compliance and low B/Cr. Family states has been off and on for 1-2 yrs. Gabapentin for this patient may be contributing, I have dc'd the gabapentin.  Dysphagia - per pmd ^Troponin - per cardiology > no CP or ischemic symptoms, follow trop trend and get TTE ESRD - on HD TTS. Has not missed dialysis. Next HD 8/22.  HTN/ vol - BP's low normal here, on room air. Euvolemic on exam. Follow.  Anemia esrd - Hb 13, not on esa at OP unit. Is getting IV Fe load at OP unit, will cont thru 8/26.  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 Add on phos.      Kelly Splinter  MD 02/19/2022, 8:33 AM Recent Labs  Lab 02/18/22 1405  HGB 13.1  ALBUMIN 2.7*  CALCIUM 9.4  CREATININE 3.22*  K 3.5

## 2022-02-19 NOTE — ED Notes (Signed)
Admitting made aware of BP trending down with a MAP of 51

## 2022-02-19 NOTE — Progress Notes (Signed)
No apparent CV complaints. Troponin has not been repeated. On my review, I do not think that she has new inferior Q waves. 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 Troponin has not been repeated. Echo not yet performed. Will reassess once necessary data is available.

## 2022-02-19 NOTE — Hospital Course (Addendum)
71 y.o. female with medical history significant of ESRD on TTS dialysis, HTN, diabetes, HLD who presented with 2 weeks decreased PO intake, diarrhea, abdominal discomfort, globus sensation.  Imaging at presentation concerning for colitis/enteritis.  She's been treated with IV abx and has received dialysis while she's been here.  GI and SLP eval.   Palliative care was engaged as she was not improving throughout hospital course She became more responsive on 9/1 and was started on a diet on 9/2 Son is primary decision maker and Family is having difficulty accepting EOL status despite multiple meetings with palliative care If patient continues to be stable in the next several days can discharge home with home health as they have support and can further these conversations in the outpatient setting

## 2022-02-19 NOTE — ED Notes (Signed)
Pt brief changed, pt cleaned and linens changed. Small area of skin breakdown between the buttocks. Cleaned and patted dry.

## 2022-02-19 NOTE — ED Notes (Signed)
Breakfast order placed ?

## 2022-02-19 NOTE — ED Notes (Signed)
Admitting made aware of pt's hypotension, advised to monitor for now. Pt sitting on the side of the bed alert, drinking juice

## 2022-02-20 ENCOUNTER — Encounter (HOSPITAL_COMMUNITY): Payer: Self-pay | Admitting: Internal Medicine

## 2022-02-20 DIAGNOSIS — E876 Hypokalemia: Secondary | ICD-10-CM

## 2022-02-20 DIAGNOSIS — F0390 Unspecified dementia without behavioral disturbance: Secondary | ICD-10-CM

## 2022-02-20 HISTORY — DX: Unspecified dementia, unspecified severity, without behavioral disturbance, psychotic disturbance, mood disturbance, and anxiety: F03.90

## 2022-02-20 LAB — CBC WITH DIFFERENTIAL/PLATELET
Abs Immature Granulocytes: 0.05 10*3/uL (ref 0.00–0.07)
Basophils Absolute: 0.1 10*3/uL (ref 0.0–0.1)
Basophils Relative: 1 %
Eosinophils Absolute: 0.2 10*3/uL (ref 0.0–0.5)
Eosinophils Relative: 3 %
HCT: 33.3 % — ABNORMAL LOW (ref 36.0–46.0)
Hemoglobin: 10.9 g/dL — ABNORMAL LOW (ref 12.0–15.0)
Immature Granulocytes: 1 %
Lymphocytes Relative: 14 %
Lymphs Abs: 1.1 10*3/uL (ref 0.7–4.0)
MCH: 32.2 pg (ref 26.0–34.0)
MCHC: 32.7 g/dL (ref 30.0–36.0)
MCV: 98.5 fL (ref 80.0–100.0)
Monocytes Absolute: 0.7 10*3/uL (ref 0.1–1.0)
Monocytes Relative: 8 %
Neutro Abs: 6 10*3/uL (ref 1.7–7.7)
Neutrophils Relative %: 73 %
Platelets: 186 10*3/uL (ref 150–400)
RBC: 3.38 MIL/uL — ABNORMAL LOW (ref 3.87–5.11)
RDW: 14.9 % (ref 11.5–15.5)
WBC: 8.1 10*3/uL (ref 4.0–10.5)
nRBC: 0 % (ref 0.0–0.2)

## 2022-02-20 LAB — GLUCOSE, CAPILLARY
Glucose-Capillary: 122 mg/dL — ABNORMAL HIGH (ref 70–99)
Glucose-Capillary: 124 mg/dL — ABNORMAL HIGH (ref 70–99)
Glucose-Capillary: 174 mg/dL — ABNORMAL HIGH (ref 70–99)
Glucose-Capillary: 277 mg/dL — ABNORMAL HIGH (ref 70–99)
Glucose-Capillary: 256 mg/dL — ABNORMAL HIGH (ref 70–99)

## 2022-02-20 LAB — HEPATITIS C ANTIBODY: HCV Ab: NONREACTIVE

## 2022-02-20 LAB — HEPATITIS B SURFACE ANTIGEN: Hepatitis B Surface Ag: NONREACTIVE

## 2022-02-20 LAB — RENAL FUNCTION PANEL
Albumin: 2.3 g/dL — ABNORMAL LOW (ref 3.5–5.0)
Anion gap: 15 (ref 5–15)
BUN: 35 mg/dL — ABNORMAL HIGH (ref 8–23)
CO2: 24 mmol/L (ref 22–32)
Calcium: 9.4 mg/dL (ref 8.9–10.3)
Chloride: 97 mmol/L — ABNORMAL LOW (ref 98–111)
Creatinine, Ser: 5.93 mg/dL — ABNORMAL HIGH (ref 0.44–1.00)
GFR, Estimated: 7 mL/min — ABNORMAL LOW (ref >60)
Glucose, Bld: 123 mg/dL — ABNORMAL HIGH (ref 70–99)
Phosphorus: 6 mg/dL — ABNORMAL HIGH (ref 2.5–4.6)
Potassium: 3.1 mmol/L — ABNORMAL LOW (ref 3.5–5.1)
Sodium: 136 mmol/L (ref 135–145)

## 2022-02-20 LAB — HEPATITIS B SURFACE ANTIBODY,QUALITATIVE: Hep B S Ab: REACTIVE — AB

## 2022-02-20 LAB — HEPATITIS B CORE ANTIBODY, TOTAL: Hep B Core Total Ab: REACTIVE — AB

## 2022-02-20 LAB — MAGNESIUM: Magnesium: 1.9 mg/dL (ref 1.7–2.4)

## 2022-02-20 MED ORDER — BOOST / RESOURCE BREEZE PO LIQD CUSTOM
1.0000 | Freq: Three times a day (TID) | ORAL | Status: DC
  Administered 2022-02-20 – 2022-03-08 (×16): 1 via ORAL

## 2022-02-20 MED ORDER — RENA-VITE PO TABS
1.0000 | ORAL_TABLET | Freq: Every day | ORAL | Status: DC
  Administered 2022-02-20 – 2022-02-22 (×3): 1 via ORAL
  Filled 2022-02-20 (×6): qty 1

## 2022-02-20 MED ORDER — INSULIN ASPART 100 UNIT/ML IJ SOLN
0.0000 [IU] | Freq: Three times a day (TID) | INTRAMUSCULAR | Status: DC
  Administered 2022-02-22: 2 [IU] via SUBCUTANEOUS
  Administered 2022-02-22: 1 [IU] via SUBCUTANEOUS
  Administered 2022-02-25 – 2022-02-26 (×4): 2 [IU] via SUBCUTANEOUS
  Administered 2022-02-27: 1 [IU] via SUBCUTANEOUS
  Administered 2022-02-27: 2 [IU] via SUBCUTANEOUS

## 2022-02-20 MED ORDER — PROSOURCE PLUS PO LIQD
30.0000 mL | Freq: Two times a day (BID) | ORAL | Status: DC
  Administered 2022-02-20 – 2022-03-08 (×14): 30 mL via ORAL
  Filled 2022-02-20 (×13): qty 30

## 2022-02-20 MED ORDER — CHLORHEXIDINE GLUCONATE CLOTH 2 % EX PADS
6.0000 | MEDICATED_PAD | Freq: Every day | CUTANEOUS | Status: DC
Start: 2022-02-20 — End: 2022-02-22
  Administered 2022-02-20 – 2022-02-22 (×3): 6 via TOPICAL

## 2022-02-20 NOTE — Progress Notes (Signed)
[facility] Progress Note   Subjective:   Pt seen in room. Alert and rubbing leg/groaning intermittently. Family at bedside reports she is not able to communicate much with them but she does shake her head "yes" when asked if her leg hurts.   Objective Vitals:   02/20/22 0258 02/20/22 0616 02/20/22 0659 02/20/22 0731  BP: (!) 98/58 (!) 120/31  (!) 123/25  Pulse:    70  Resp: 20 19  20   Temp: 98.2 F (36.8 C)   98.8 F (37.1 C)  TempSrc: Axillary     SpO2: 92%   98%  Weight:   56.7 kg   Height:   5' (1.524 m)    Physical Exam General: Elderly female, intermittently appears uncomfortable Heart: RRR, no murmurs, rubs, or gallops Lungs: CTA bilaterally without wheezing, rhonchi or rales Abdomen: Soft, non-distended, +BS Extremities: No edema b/l lower extremities Dialysis Access: LUE AVG + bruit  Additional Objective Labs: Basic Metabolic Panel: Recent Labs  Lab 02/18/22 1405 02/19/22 1157  NA 136 138  K 3.5 3.6  CL 94* 98  CO2 29 27  GLUCOSE 275* 193*  BUN 21 27*  CREATININE 3.22* 4.28*  CALCIUM 9.4 9.8  PHOS  --  5.3*   Liver Function Tests: Recent Labs  Lab 02/18/22 1405 02/19/22 1157  AST 46* 38  ALT 28 20  ALKPHOS 166* 126  BILITOT 0.8 0.9  PROT 7.1 6.1*  ALBUMIN 2.7* 2.3*   Recent Labs  Lab 02/18/22 1442  LIPASE 51   CBC: Recent Labs  Lab 02/18/22 1405 02/19/22 1157  WBC 11.0* 9.6  HGB 13.1 11.1*  HCT 39.5 33.8*  MCV 99.5 100.0  PLT 221 191   Blood Culture    Component Value Date/Time   SDES BLOOD RIGHT HAND 02/13/2018 0404   SPECREQUEST  02/13/2018 0404    BOTTLES DRAWN AEROBIC AND ANAEROBIC Blood Culture adequate volume   CULT  02/13/2018 0404    NO GROWTH 5 DAYS Performed at [facility], [place] [address], [place], [place] [address]    REPTSTATUS 02/18/2018 FINAL 02/13/2018 0404    Cardiac Enzymes: No results for input(s): "CKTOTAL", "CKMB", "CKMBINDEX", "TROPONINI" in the last 168 hours. CBG: Recent  Labs  Lab 02/18/22 2303 02/19/22 0728 02/19/22 1139 02/19/22 2022 02/20/22 0213  GLUCAP 204* 98 181* 233* 122*   Iron Studies: No results for input(s): "IRON", "TIBC", "TRANSFERRIN", "FERRITIN" in the last 72 hours. @lablastinr3 @ Studies/Results: ECHOCARDIOGRAM COMPLETE  Result Date: 02/19/2022    ECHOCARDIOGRAM REPORT   Patient Name:   Sarah Kelley Date of Exam: 02/19/2022 Medical Rec #:  [id number]              Height:       63.0 in Accession #:    [id number]             Weight:       132.3 lb Date of Birth:  [birth year]              BSA:          1.622 m Patient Age:    71 years               BP:           103/34 mmHg Patient Gender: F                      HR:           79 bpm.  Exam Location:  Inpatient Procedure: 2D Echo, Cardiac Doppler, Color Doppler and Intracardiac            Opacification Agent Indications:    Elevated troponin  History:        Patient has prior history of Echocardiogram examinations, most                 recent 02/13/2017. Risk Factors:Hypertension, Diabetes and                 Dyslipidemia.  Sonographer:    Clayton Lefort RDCS (AE) Referring Phys: [id number] [facility]  Sonographer Comments: Suboptimal apical window. IMPRESSIONS  1. Left ventricular ejection fraction, by estimation, is 55%. The left ventricle has normal function. The left ventricle has no regional wall motion abnormalities. Left ventricular diastolic parameters are consistent with Grade I diastolic dysfunction (impaired relaxation). Elevated left atrial pressure.  2. Right ventricular systolic function is normal. The right ventricular size is normal.  3. The mitral valve is abnormal. Mild to moderate mitral valve regurgitation. No evidence of mitral stenosis.  4. The aortic valve is tricuspid. There is mild calcification of the aortic valve. There is mild thickening of the aortic valve. Aortic valve regurgitation is mild to moderate. No aortic stenosis is present.  5. The inferior vena cava is normal in  size with greater than 50% respiratory variability, suggesting right atrial pressure of 3 mmHg. FINDINGS  Left Ventricle: Left ventricular ejection fraction, by estimation, is 55%. The left ventricle has normal function. The left ventricle has no regional wall motion abnormalities. Definity contrast agent was given IV to delineate the left ventricular endocardial borders. The left ventricular internal cavity size was normal in size. There is no left ventricular hypertrophy. Left ventricular diastolic parameters are consistent with Grade I diastolic dysfunction (impaired relaxation). Elevated left atrial pressure. Right Ventricle: The right ventricular size is normal. Right vetricular wall thickness was not well visualized. Right ventricular systolic function is normal. Left Atrium: Left atrial size was normal in size. Right Atrium: Right atrial size was normal in size. Pericardium: There is no evidence of pericardial effusion. Mitral Valve: The mitral valve is abnormal. There is mild thickening of the mitral valve leaflet(s). There is mild calcification of the mitral valve leaflet(s). Mild mitral annular calcification. Mild to moderate mitral valve regurgitation. No evidence of mitral valve stenosis. Tricuspid Valve: The tricuspid valve is normal in structure. Tricuspid valve regurgitation is not demonstrated. No evidence of tricuspid stenosis. Aortic Valve: The aortic valve is tricuspid. There is mild calcification of the aortic valve. There is mild thickening of the aortic valve. There is mild aortic valve annular calcification. Aortic valve regurgitation is mild to moderate. Aortic regurgitation PHT measures 521 msec. No aortic stenosis is present. Aortic valve mean gradient measures 6.0 mmHg. Aortic valve peak gradient measures 9.9 mmHg. Aortic valve area, by VTI measures 1.50 cm. Pulmonic Valve: The pulmonic valve was not well visualized. Pulmonic valve regurgitation is not visualized. No evidence of pulmonic  stenosis. Aorta: The aortic root is normal in size and structure. Venous: The inferior vena cava is normal in size with greater than 50% respiratory variability, suggesting right atrial pressure of 3 mmHg. IAS/Shunts: No atrial level shunt detected by color flow Doppler.  LEFT VENTRICLE PLAX 2D LVIDd:         5.00 cm   Diastology LVIDs:         3.70 cm   LV e' medial:    5.00 cm/s LV PW:  1.00 cm   LV E/e' medial:  17.6 LV IVS:        0.90 cm   LV e' lateral:   4.57 cm/s LVOT diam:     1.90 cm   LV E/e' lateral: 19.3 LV SV:         46 LV SV Index:   28 LVOT Area:     2.84 cm  RIGHT VENTRICLE RV Basal diam:  3.20 cm RV S prime:     13.80 cm/s TAPSE (M-mode): 2.3 cm LEFT ATRIUM             Index        RIGHT ATRIUM           Index LA diam:        3.90 cm 2.40 cm/m   RA Area:     10.50 cm LA Vol (A2C):   48.7 ml 30.02 ml/m  RA Volume:   21.80 ml  13.44 ml/m LA Vol (A4C):   48.3 ml 29.78 ml/m LA Biplane Vol: 49.2 ml 30.33 ml/m  AORTIC VALVE AV Area (Vmax):    1.48 cm AV Area (Vmean):   1.49 cm AV Area (VTI):     1.50 cm AV Vmax:           157.00 cm/s AV Vmean:          109.000 cm/s AV VTI:            0.305 m AV Peak Grad:      9.9 mmHg AV Mean Grad:      6.0 mmHg LVOT Vmax:         81.80 cm/s LVOT Vmean:        57.200 cm/s LVOT VTI:          0.161 m LVOT/AV VTI ratio: 0.53 AI PHT:            521 msec  AORTA Ao Root diam: 3.00 cm Ao Asc diam:  3.10 cm MITRAL VALVE MV Area (PHT): 4.60 cm     SHUNTS MV Decel Time: 165 msec     Systemic VTI:  0.16 m MV E velocity: 88.20 cm/s   Systemic Diam: 1.90 cm MV A velocity: 106.00 cm/s MV E/A ratio:  0.83 Carlyle Dolly MD Electronically signed by Carlyle Dolly MD Signature Date/Time: 02/19/2022/5:29:54 PM    Final    CT CHEST ABDOMEN PELVIS W CONTRAST  Result Date: 02/18/2022 CLINICAL DATA:  Sepsis. Diminished appetite for 2 weeks. Increased sweating. EXAM: CT CHEST, ABDOMEN, AND PELVIS WITH CONTRAST TECHNIQUE: Multidetector CT imaging of the chest, abdomen  and pelvis was performed following the standard protocol during bolus administration of intravenous contrast. RADIATION DOSE REDUCTION: This exam was performed according to the departmental dose-optimization program which includes automated exposure control, adjustment of the mA and/or kV according to patient size and/or use of iterative reconstruction technique. CONTRAST:  89mL OMNIPAQUE IOHEXOL 300 MG/ML  SOLN COMPARISON:  Chest radiograph earlier today. Abdominopelvic CT 02/16/2018 FINDINGS: CT CHEST FINDINGS Cardiovascular: Mild cardiomegaly. There are coronary artery and pericardial calcifications. No pericardial effusion. Moderate atherosclerosis of the thoracic aorta without evidence of acute aortic finding. Vascular stent arising from the left subclavian artery coursing superiorly is only partially included in the field of view, but may be occluded, image 1 series 3. There is no obvious pulmonary embolus in the central most pulmonary arteries, although assessment for pulmonary embolus is limited. Mediastinum/Nodes: Scattered small mediastinal lymph nodes, not enlarged by size criteria. There are mildly  prominent bilateral hilar nodes. The esophagus is patulous and contains scattered intraluminal fluid. No wall thickening. No thyroid nodule. Lungs/Pleura: Moderate layering right pleural effusion with associated compressive atelectasis. Moderate left pleural effusion is partially loculated and courses into the fissure. There is associated compressive atelectasis. Calcified granuloma in the right upper lobe. Mild hazy ground-glass opacity in the dependent lungs. Mild left lower lobe bronchial thickening with occasional mucous plugging. No endobronchial lesion or debris in the more proximal bronchi. Musculoskeletal: Scattered Schmorl's nodes throughout the thoracic spine. There are no acute or suspicious osseous abnormalities. CT ABDOMEN PELVIS FINDINGS Hepatobiliary: No focal hepatic abnormality. Gallbladder  physiologically distended, no calcified stone. No biliary dilatation. Pancreas: No ductal dilatation or inflammation. Spleen: Normal in size without focal abnormality. Adrenals/Urinary Tract: No adrenal nodule. Sequela of chronic renal disease with native renal atrophy. No hydronephrosis or renal inflammation. The urinary bladder is essentially empty, but thick walled. Stomach/Bowel: Fluid within the stomach, no definite gastric wall thickening. Occasional fluid-filled loops of small bowel. There is suggestion of mild diffuse small bowel wall thickening. No obstruction. There is colonic wall thickening with mild pericolonic edema involving the transverse colon. Possible additional areas of colonic inflammation in the sigmoid. No bowel pneumatosis. The appendix is not visualized. Vascular/Lymphatic: Advanced aortic atherosclerosis. Moderate atherosclerosis of aortic branches. No evidence of embolic disease. There may be areas of stenosis of the distal SMA related to atheromatous plaque. No bulky adenopathy. Reproductive: Diffuse uterine vascular calcifications. No adnexal mass. Other: Lower abdominal ventral abdominal wall hernia contains only fat. Small right inguinal hernia contains small amount of free fluid. No free air or ascites. No abdominopelvic collection. Musculoskeletal: Vague sclerotic focus in the right iliac bone is unchanged. Remote right posterior lower rib fracture. IMPRESSION: 1. Colonic wall thickening with mild pericolonic edema involving the transverse colon, suspicious for colitis. Possible additional areas of colonic inflammation in the sigmoid colon. There is also mild diffuse small bowel wall thickening small possible enteritis. 2. Moderate layering right pleural effusion with associated compressive atelectasis. Moderate left pleural effusion is partially loculated and courses into the fissure. 3. Mild hazy ground-glass opacity in the dependent lungs, atelectasis or mild pulmonary edema. 4.  Patulous esophagus with scattered intraluminal fluid, suggesting reflux. 5. Thick-walled urinary bladder, chronic. 6. Additional chronic findings are stable as described. Aortic Atherosclerosis (ICD10-I70.0). Electronically Signed   By: Keith Rake M.D.   On: 02/18/2022 21:22   DG Chest 2 View  Result Date: 02/18/2022 CLINICAL DATA:  Not eating.  Wheezing. EXAM: CHEST - 2 VIEW COMPARISON:  09/06/2019. FINDINGS: Cardiac silhouette is mildly enlarged. No mediastinal or hilar masses. No evidence of adenopathy. Small effusions. Mild lung base opacity consistent with atelectasis. Remainder of the lungs is clear. No pneumothorax. Stable left subclavian to axillary stent. Skeletal structures are intact. IMPRESSION: 1. No acute cardiopulmonary disease. 2. Small effusions with mild basilar atelectasis. No convincing pneumonia or pulmonary edema. Electronically Signed   By: Lajean Manes M.D.   On: 02/18/2022 16:23   CT Head Wo Contrast  Result Date: 02/18/2022 CLINICAL DATA:  Mental status change, unknown cause EXAM: CT HEAD WITHOUT CONTRAST TECHNIQUE: Contiguous axial images were obtained from the base of the skull through the vertex without intravenous contrast. RADIATION DOSE REDUCTION: This exam was performed according to the departmental dose-optimization program which includes automated exposure control, adjustment of the mA and/or kV according to patient size and/or use of iterative reconstruction technique. COMPARISON:  Head CT 07/15/2020 FINDINGS: Brain: Stable degree of atrophy and  chronic small vessel ischemia from prior exam. Remote lacunar infarcts in the right external capsule. Remote infarct in the right cerebellum and high right parietal lobe. No evidence of acute ischemia. No hemorrhage or subdural collection. No hydrocephalus. No midline shift or mass effect. Enlarged partially empty sella, unchanged. Vascular: Atherosclerosis of skullbase vasculature without hyperdense vessel or abnormal  calcification. Skull: No fracture or focal lesion. Sinuses/Orbits: Chronic mucosal thickening of ethmoid air cells with frothy debris in the sphenoid sinus. Postsurgical change in both globes. Other: None. IMPRESSION: 1. No acute intracranial abnormality. 2. Stable atrophy, chronic small vessel ischemia, and remote infarcts. Electronically Signed   By: Keith Rake M.D.   On: 02/18/2022 16:22   Medications:  cefTRIAXone (ROCEPHIN)  IV 2 g (02/19/22 2150)   [START ON 02/21/2022] ferric gluconate (FERRLECIT) IVPB     metronidazole 500 mg (02/19/22 2309)    heparin  5,000 Units Subcutaneous Q8H   insulin aspart  0-6 Units Subcutaneous Q6H   insulin glargine-yfgn  8 Units Subcutaneous QHS   midodrine  5 mg Oral TID WC    Dialysis Orders: [facility]h  400/500  52kg  2/2 baht  P2  Heparin 2000 LUA AVG - hep B labs here: pend - last HD 8/19, post wt 53.3kg, last wk edw lowered 3kg - last Hb 11.8 on 8/15, tsat 26%, no esa - iron sucrose IV 100 q hd thru 8/26 - doxercalciferol 5 ug IV tiw  Assessment/Plan: Enterocolitis - w/ diarrhea , poor po intake for 2 weeks. Started on IV abx here, GI path panel pending and Cdif as well.  AMS - some of this is chronic, possibly dementia. Not uremia given good HD compliance and low B/Cr. Family states has been off and on for 1-2 yrs. Gabapentin for this patient may be contributing, has been discontinued.  Dysphagia - per pmd ^Troponin - per cardiology > no CP or ischemic symptoms, follow trop trend and get TTE ESRD - on HD TTS. Has not missed dialysis. Next HD 8/22.  HTN/ vol - BP's low normal here, on room air. Euvolemic on exam. Follow.  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 Is getting IV Fe load at OP unit, will cont thru 8/26.  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 Phos at goal. Leg pain- management per primary team    Anice Paganini, PA-C 02/20/2022, 8:56 AM  [facility] Pager: [phone]

## 2022-02-20 NOTE — Progress Notes (Signed)
PROGRESS NOTE    Sarah Kelley  HGD:[id number] DOB: 1951-06-18 DOA: 02/18/2022 PCP: [name], MD    Chief Complaint  Patient presents with   Failure To Thrive    Brief Narrative:  HPI per Dr. Johnathan Hausen Ferraz is a 71 y.o. female with medical history significant of ESRD on TTS dialysis, last had dialysis earlier today.  HTN, HLD, DM2 on insulin.   Pt presents to ED with 2 week h/o reduced PO intake, diaphoresis over past 3-4 days and profuse diarrhea.   Per PT: feels like food gets stuck in throat.   No CP, SOB.   No sick contacts in family, no one else in family is sick.    Assessment & Plan:  Principal Problem:   Enterocolitis Active Problems:   Dysphagia   ESRD (end stage renal disease) (HCC)   Elevated troponin   Essential hypertension   Type 2 diabetes mellitus with chronic kidney disease on chronic dialysis, with long-term current use of insulin (HCC)   Hypothyroidism   Dementia without behavioral disturbance (HCC)   Hypokalemia    Assessment and Plan: * Enterocolitis Presumed infectious initially on admission, with frequent diarrhea and decreased PO intake. GI pathogen pnl negative. C.Diff neg CT abdomen and pelvis concerning for colitis.  Continue empiric IV Rocephin and Flagyl, and treat empirically for total of 5 days. See ESRD below regarding fluid status GI following and appreciate their input and recommendations.  Dysphagia Probably the underlying cause of reduced PO intake, even more so than the GI illness. 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  Patient seen by speech therapy currently on clear liquids which we will continue for the next 24  hours and if continued improvement could advance to a full liquid diet tomorrow.  Elevated troponin Cards consulting on patient They indicate that this is likely demand ischemia Troponin is elevated but seem to be flattened.  Patient with doubt any chest pain. 2D echo from 02/13/2018 with EF of 55 to 96%,QIWL, grade 1 diastolic dysfunction. Repeat 2D echo with EF of 79%,GXQJ, grade 1 diastolic dysfunction, normal right ventricular systolic function, mild to moderate MVR, no evidence of mitral stenosis. Patient seen in consultation by cardiology who reviewed 2D echo and EKG and will not pursue any further cardiac evaluation at this time, cardiology feels troponins flat not consistent with ACS and also feel patient is not an ideal candidate for invasive work-up.  ESRD (end stage renal disease) ([place]) Appears to be possibly volume overloaded with mod B pleural effusions on CT scan despite recent GI illness with diarrhea, poor PO intake, and dialysis on 02/18/2022 prior to presentation in the ED.Marland Kitchen Despite this no symptoms of volume overload, so B effusions may be very chronic. Patient noted to have received a 1 L bolus in the ED we will monitor blood pressure and if drops further we will give a 250 cc bolus of normal saline x1 due to ESRD.  BNP elevated at 1707.5.  Nephrology following.  Hypothyroidism Mildly low TSH today on labs Checking T4 to confirm Start synthroid if confirmed. Looks like "thyroid disease" is on her [facility] list, but I dont see where shes on any sort of thyroid hormone replacement. Likely needs outpatient follow-up.  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 -CBG 122 this morning.   -Change CBGs to before meals and at bedtime.   -Continue Semglee 8 units daily, SSI.    Essential hypertension Soft BPs. -Continue to hold BP medications. -Patient started on  midodrine.  Hypokalemia - Likely in part secondary to GI losses. -Defer repletion to nephrology as patient ESRD on HD.  Dementia without behavioral disturbance ([place]) - Stable. -Patient with a severe dementia, monitor for now. -         DVT prophylaxis: Heparin Code Status: Full Family Communication: Updated patient, daughter and granddaughter at bedside.  Updated son at bedside this afternoon around 2:30 PM with Spanish interpreter. Disposition: Home when medically stable with clinical improvement hopefully in the next 3 to 4 days.  Status is: Inpatient    Consultants:  Nephrology: Dr.Schertz 02/19/2022 Gastroenterology: Dr. Tarri Glenn 02/19/2022   Procedures:  Central line placement per EDP Dr.Dixon 02/19/2022 CT chest abdomen and pelvis 02/18/2022 Chest x-ray 02/18/2022  Antimicrobials:  IV Rocephin 02/18/2022>>>> IV Flagyl 02/18/2022>>>>   Subjective: Sleeping but arousable.  Nonverbal.  Occasional morning.  Family at bedside.   Came to update son at bedside this afternoon around 230 and patient noted to be more alert, interacting with son.  Objective: Vitals:   02/20/22 0616 02/20/22 0659 02/20/22 0731 02/20/22 1100  BP: (!) 120/31  (!) 123/25 (!) 122/56  Pulse:   70   Resp: 19  20   Temp:   98.8 F (37.1 C)   TempSrc:      SpO2:   98%   Weight:  56.7 kg    Height:  5' (1.524 m)      Intake/Output Summary (Last 24 hours) at 02/20/2022 1614 Last data filed at 02/20/2022 0307 Gross per 24 hour  Intake 530.69 ml  Output --  Net 530.69 ml   Filed Weights   02/20/22 0659  Weight: 56.7 kg    Examination:  General exam: Drowsy this morning however arousable.  Dry mucous membranes.  Respiratory system: CTA B anterior lung fields.  Some gurgling noted.  No wheezes, no crackles, no rhonchi.  Fair air movement.  Cardiovascular system: Regular rate rhythm no murmurs rubs or gallops.  No JVD.  No lower extremity edema.  Gastrointestinal system: Abdomen is soft,  nontender, nondistended, positive bowel sounds.  No rebound.  No guarding.  Central nervous system: Drowsy but arousable. No focal neurological deficits. Extremities: Symmetric 5 x 5 power. Skin: No rashes, lesions or ulcers Psychiatry: Judgement and insight unable to assess. Mood & affect appropriate.     Data Reviewed:   CBC: Recent Labs  Lab 02/18/22 1405 02/19/22 1157 02/20/22 1045  WBC 11.0* 9.6 8.1  NEUTROABS  --   --  6.0  HGB 13.1 11.1* 10.9*  HCT 39.5 33.8* 33.3*  MCV 99.5 100.0 98.5  PLT 221 191 885    Basic Metabolic Panel: Recent Labs  Lab 02/18/22 1405 02/18/22 1453 02/19/22 1157 02/20/22 1045  NA 136  --  138 136  K 3.5  --  3.6 3.1*  CL 94*  --  98 97*  CO2 29  --  27 24  GLUCOSE 275*  --  193* 123*  BUN 21  --  27* 35*  CREATININE 3.22*  --  4.28* 5.93*  CALCIUM 9.4  --  9.8 9.4  MG  --  2.1  --  1.9  PHOS  --   --  5.3* 6.0*    GFR: Estimated Creatinine Clearance: 6.8 mL/min (A) (by C-G formula based on SCr of 5.93 mg/dL (H)).  Liver Function Tests: Recent Labs  Lab 02/18/22 1405 02/19/22 1157 02/20/22 1045  AST 46* 38  --   ALT 28 20  --   ALKPHOS 166* 126  --   BILITOT 0.8 0.9  --   PROT 7.1 6.1*  --   ALBUMIN 2.7* 2.3* 2.3*    CBG: Recent Labs  Lab 02/19/22 1139 02/19/22 2022 02/20/22 0213 02/20/22 0924 02/20/22 1510  GLUCAP 181* 233* 122* 124* 174*     Recent Results (from the past 240 hour(s))  C Difficile Quick Screen w PCR reflex     Status: None   Collection Time: 02/18/22  5:41 PM   Specimen: STOOL  Result Value Ref Range Status   C Diff antigen NEGATIVE NEGATIVE Final   C Diff toxin NEGATIVE NEGATIVE Final   C Diff interpretation No C. difficile detected.  Final    Comment: Performed at [facility], [place] [address], [place], [place] [address]  Gastrointestinal Panel by PCR , Stool     Status: None   Collection Time: 02/18/22  5:42 PM   Specimen: STOOL  Result Value Ref Range Status   Campylobacter  species NOT DETECTED NOT DETECTED Final   Plesimonas shigelloides NOT DETECTED NOT DETECTED Final   Salmonella species NOT DETECTED NOT DETECTED Final   Yersinia enterocolitica NOT DETECTED NOT DETECTED Final   Vibrio species NOT DETECTED NOT DETECTED Final   Vibrio cholerae NOT DETECTED NOT DETECTED Final   Enteroaggregative E coli (EAEC) NOT DETECTED NOT DETECTED Final   Enteropathogenic E coli (EPEC) NOT DETECTED NOT DETECTED Final   Enterotoxigenic E coli (ETEC) NOT DETECTED NOT DETECTED Final   Shiga like toxin producing E coli (STEC) NOT DETECTED NOT DETECTED Final   Shigella/Enteroinvasive E coli (EIEC) NOT DETECTED NOT DETECTED Final   Cryptosporidium NOT DETECTED NOT DETECTED Final   Cyclospora cayetanensis NOT DETECTED NOT DETECTED Final   Entamoeba histolytica NOT DETECTED NOT DETECTED Final   Giardia lamblia NOT DETECTED NOT DETECTED Final   Adenovirus F40/41 NOT DETECTED NOT DETECTED Final   Astrovirus NOT DETECTED NOT DETECTED Final   Norovirus GI/GII NOT DETECTED NOT DETECTED Final   Rotavirus A NOT DETECTED NOT DETECTED Final   Sapovirus (I, II, IV, and V) NOT DETECTED NOT DETECTED Final    Comment: Performed at [facility], [address], [place], [place] [address]         Radiology Studies: ECHOCARDIOGRAM COMPLETE  Result Date: 02/19/2022    ECHOCARDIOGRAM REPORT   Patient Name:   Sarah Kelley Date of Exam: 02/19/2022 Medical Rec #:  [id number]              Height:       63.0 in Accession #:    [id number]             Weight:       132.3 lb Date of Birth:  [birth year]              BSA:          1.622 m Patient Age:    70 years               BP:           103/34 mmHg Patient Gender: F  HR:           79 bpm. Exam Location:  Inpatient Procedure: 2D Echo, Cardiac Doppler, Color Doppler and Intracardiac            Opacification Agent Indications:    Elevated troponin  History:        Patient has prior history of Echocardiogram  examinations, most                 recent 02/13/2017. Risk Factors:Hypertension, Diabetes and                 Dyslipidemia.  Sonographer:    Clayton Lefort RDCS (AE) Referring Phys: [id number] [facility]  Sonographer Comments: Suboptimal apical window. IMPRESSIONS  1. Left ventricular ejection fraction, by estimation, is 55%. The left ventricle has normal function. The left ventricle has no regional wall motion abnormalities. Left ventricular diastolic parameters are consistent with Grade I diastolic dysfunction (impaired relaxation). Elevated left atrial pressure.  2. Right ventricular systolic function is normal. The right ventricular size is normal.  3. The mitral valve is abnormal. Mild to moderate mitral valve regurgitation. No evidence of mitral stenosis.  4. The aortic valve is tricuspid. There is mild calcification of the aortic valve. There is mild thickening of the aortic valve. Aortic valve regurgitation is mild to moderate. No aortic stenosis is present.  5. The inferior vena cava is normal in size with greater than 50% respiratory variability, suggesting right atrial pressure of 3 mmHg. FINDINGS  Left Ventricle: Left ventricular ejection fraction, by estimation, is 55%. The left ventricle has normal function. The left ventricle has no regional wall motion abnormalities. Definity contrast agent was given IV to delineate the left ventricular endocardial borders. The left ventricular internal cavity size was normal in size. There is no left ventricular hypertrophy. Left ventricular diastolic parameters are consistent with Grade I diastolic dysfunction (impaired relaxation). Elevated left atrial pressure. Right Ventricle: The right ventricular size is normal. Right vetricular wall thickness was not well visualized. Right ventricular systolic function is normal. Left Atrium: Left atrial size was normal in size. Right Atrium: Right atrial size was normal in size. Pericardium: There is no evidence of pericardial  effusion. Mitral Valve: The mitral valve is abnormal. There is mild thickening of the mitral valve leaflet(s). There is mild calcification of the mitral valve leaflet(s). Mild mitral annular calcification. Mild to moderate mitral valve regurgitation. No evidence of mitral valve stenosis. Tricuspid Valve: The tricuspid valve is normal in structure. Tricuspid valve regurgitation is not demonstrated. No evidence of tricuspid stenosis. Aortic Valve: The aortic valve is tricuspid. There is mild calcification of the aortic valve. There is mild thickening of the aortic valve. There is mild aortic valve annular calcification. Aortic valve regurgitation is mild to moderate. Aortic regurgitation PHT measures 521 msec. No aortic stenosis is present. Aortic valve mean gradient measures 6.0 mmHg. Aortic valve peak gradient measures 9.9 mmHg. Aortic valve area, by VTI measures 1.50 cm. Pulmonic Valve: The pulmonic valve was not well visualized. Pulmonic valve regurgitation is not visualized. No evidence of pulmonic stenosis. Aorta: The aortic root is normal in size and structure. Venous: The inferior vena cava is normal in size with greater than 50% respiratory variability, suggesting right atrial pressure of 3 mmHg. IAS/Shunts: No atrial level shunt detected by color flow Doppler.  LEFT VENTRICLE PLAX 2D LVIDd:         5.00 cm   Diastology LVIDs:         3.70 cm  LV e' medial:    5.00 cm/s LV PW:         1.00 cm   LV E/e' medial:  17.6 LV IVS:        0.90 cm   LV e' lateral:   4.57 cm/s LVOT diam:     1.90 cm   LV E/e' lateral: 19.3 LV SV:         46 LV SV Index:   28 LVOT Area:     2.84 cm  RIGHT VENTRICLE RV Basal diam:  3.20 cm RV S prime:     13.80 cm/s TAPSE (M-mode): 2.3 cm LEFT ATRIUM             Index        RIGHT ATRIUM           Index LA diam:        3.90 cm 2.40 cm/m   RA Area:     10.50 cm LA Vol (A2C):   48.7 ml 30.02 ml/m  RA Volume:   21.80 ml  13.44 ml/m LA Vol (A4C):   48.3 ml 29.78 ml/m LA Biplane Vol:  49.2 ml 30.33 ml/m  AORTIC VALVE AV Area (Vmax):    1.48 cm AV Area (Vmean):   1.49 cm AV Area (VTI):     1.50 cm AV Vmax:           157.00 cm/s AV Vmean:          109.000 cm/s AV VTI:            0.305 m AV Peak Grad:      9.9 mmHg AV Mean Grad:      6.0 mmHg LVOT Vmax:         81.80 cm/s LVOT Vmean:        57.200 cm/s LVOT VTI:          0.161 m LVOT/AV VTI ratio: 0.53 AI PHT:            521 msec  AORTA Ao Root diam: 3.00 cm Ao Asc diam:  3.10 cm MITRAL VALVE MV Area (PHT): 4.60 cm     SHUNTS MV Decel Time: 165 msec     Systemic VTI:  0.16 m MV E velocity: 88.20 cm/s   Systemic Diam: 1.90 cm MV A velocity: 106.00 cm/s MV E/A ratio:  0.83 Carlyle Dolly MD Electronically signed by Carlyle Dolly MD Signature Date/Time: 02/19/2022/5:29:54 PM    Final    CT CHEST ABDOMEN PELVIS W CONTRAST  Result Date: 02/18/2022 CLINICAL DATA:  Sepsis. Diminished appetite for 2 weeks. Increased sweating. EXAM: CT CHEST, ABDOMEN, AND PELVIS WITH CONTRAST TECHNIQUE: Multidetector CT imaging of the chest, abdomen and pelvis was performed following the standard protocol during bolus administration of intravenous contrast. RADIATION DOSE REDUCTION: This exam was performed according to the departmental dose-optimization program which includes automated exposure control, adjustment of the mA and/or kV according to patient size and/or use of iterative reconstruction technique. CONTRAST:  45mL OMNIPAQUE IOHEXOL 300 MG/ML  SOLN COMPARISON:  Chest radiograph earlier today. Abdominopelvic CT 02/16/2018 FINDINGS: CT CHEST FINDINGS Cardiovascular: Mild cardiomegaly. There are coronary artery and pericardial calcifications. No pericardial effusion. Moderate atherosclerosis of the thoracic aorta without evidence of acute aortic finding. Vascular stent arising from the left subclavian artery coursing superiorly is only partially included in the field of view, but may be occluded, image 1 series 3. There is no obvious pulmonary embolus in the  central most pulmonary arteries, although assessment  for pulmonary embolus is limited. Mediastinum/Nodes: Scattered small mediastinal lymph nodes, not enlarged by size criteria. There are mildly prominent bilateral hilar nodes. The esophagus is patulous and contains scattered intraluminal fluid. No wall thickening. No thyroid nodule. Lungs/Pleura: Moderate layering right pleural effusion with associated compressive atelectasis. Moderate left pleural effusion is partially loculated and courses into the fissure. There is associated compressive atelectasis. Calcified granuloma in the right upper lobe. Mild hazy ground-glass opacity in the dependent lungs. Mild left lower lobe bronchial thickening with occasional mucous plugging. No endobronchial lesion or debris in the more proximal bronchi. Musculoskeletal: Scattered Schmorl's nodes throughout the thoracic spine. There are no acute or suspicious osseous abnormalities. CT ABDOMEN PELVIS FINDINGS Hepatobiliary: No focal hepatic abnormality. Gallbladder physiologically distended, no calcified stone. No biliary dilatation. Pancreas: No ductal dilatation or inflammation. Spleen: Normal in size without focal abnormality. Adrenals/Urinary Tract: No adrenal nodule. Sequela of chronic renal disease with native renal atrophy. No hydronephrosis or renal inflammation. The urinary bladder is essentially empty, but thick walled. Stomach/Bowel: Fluid within the stomach, no definite gastric wall thickening. Occasional fluid-filled loops of small bowel. There is suggestion of mild diffuse small bowel wall thickening. No obstruction. There is colonic wall thickening with mild pericolonic edema involving the transverse colon. Possible additional areas of colonic inflammation in the sigmoid. No bowel pneumatosis. The appendix is not visualized. Vascular/Lymphatic: Advanced aortic atherosclerosis. Moderate atherosclerosis of aortic branches. No evidence of embolic disease. There may be  areas of stenosis of the distal SMA related to atheromatous plaque. No bulky adenopathy. Reproductive: Diffuse uterine vascular calcifications. No adnexal mass. Other: Lower abdominal ventral abdominal wall hernia contains only fat. Small right inguinal hernia contains small amount of free fluid. No free air or ascites. No abdominopelvic collection. Musculoskeletal: Vague sclerotic focus in the right iliac bone is unchanged. Remote right posterior lower rib fracture. IMPRESSION: 1. Colonic wall thickening with mild pericolonic edema involving the transverse colon, suspicious for colitis. Possible additional areas of colonic inflammation in the sigmoid colon. There is also mild diffuse small bowel wall thickening small possible enteritis. 2. Moderate layering right pleural effusion with associated compressive atelectasis. Moderate left pleural effusion is partially loculated and courses into the fissure. 3. Mild hazy ground-glass opacity in the dependent lungs, atelectasis or mild pulmonary edema. 4. Patulous esophagus with scattered intraluminal fluid, suggesting reflux. 5. Thick-walled urinary bladder, chronic. 6. Additional chronic findings are stable as described. Aortic Atherosclerosis (ICD10-I70.0). Electronically Signed   By: Keith Rake M.D.   On: 02/18/2022 21:22        Scheduled Meds:  (feeding supplement) PROSource Plus  30 mL Oral BID BM   Chlorhexidine Gluconate Cloth  6 each Topical Q0600   feeding supplement  1 Container Oral TID BM   heparin  5,000 Units Subcutaneous Q8H   insulin aspart  0-6 Units Subcutaneous Q6H   insulin glargine-yfgn  8 Units Subcutaneous QHS   midodrine  5 mg Oral TID WC   Continuous Infusions:  cefTRIAXone (ROCEPHIN)  IV 2 g (02/19/22 2150)   [START ON 02/21/2022] ferric gluconate (FERRLECIT) IVPB     metronidazole 500 mg (02/20/22 1139)     LOS: 1 day    Time spent: 40 minutes    Irine Seal, MD [facility]   To contact the  attending provider between 7A-7P or the covering provider during after hours 7P-7A, please log into the web site [email or web address] and access using universal [place] password for that web site. If you do not have  the password, please call the hospital operator.  02/20/2022, 4:14 PM

## 2022-02-20 NOTE — Progress Notes (Addendum)
Progress Note  Patient Name: Brookelle Pellicane Date of Encounter: 02/20/2022  [facility] Cardiologist: New  Subjective   Denies any recent SOB and chest pain, granddaughter mentioned some abdominal discomfort, but that has resolved as well.   Inpatient Medications    Scheduled Meds:  Chlorhexidine Gluconate Cloth  6 each Topical Q0600   heparin  5,000 Units Subcutaneous Q8H   insulin aspart  0-6 Units Subcutaneous Q6H   insulin glargine-yfgn  8 Units Subcutaneous QHS   midodrine  5 mg Oral TID WC   Continuous Infusions:  cefTRIAXone (ROCEPHIN)  IV 2 g (02/19/22 2150)   [START ON 02/21/2022] ferric gluconate (FERRLECIT) IVPB     metronidazole 500 mg (02/19/22 2309)   PRN Meds: acetaminophen **OR** acetaminophen, loperamide, ondansetron **OR** ondansetron (ZOFRAN) IV   Vital Signs    Vitals:   02/20/22 0258 02/20/22 0616 02/20/22 0659 02/20/22 0731  BP: (!) 98/58 (!) 120/31  (!) 123/25  Pulse:    70  Resp: 20 19  20   Temp: 98.2 F (36.8 C)   98.8 F (37.1 C)  TempSrc: Axillary     SpO2: 92%   98%  Weight:   56.7 kg   Height:   5' (1.524 m)     Intake/Output Summary (Last 24 hours) at 02/20/2022 1128 Last data filed at 02/20/2022 0307 Gross per 24 hour  Intake 530.69 ml  Output --  Net 530.69 ml      02/20/2022    6:59 AM 12/26/2021   10:04 AM 07/22/2021    7:23 AM  Last 3 Weights  Weight (lbs) 125 lb 132 lb 4.4 oz 149 lb 14.6 oz  Weight (kg) 56.7 kg 60 kg 68 kg      Telemetry    NSR with PACs, no obvious sign of afib despite telemetry mentioning possible afib.  - Personally Reviewed  ECG    NSR with RBBB and LAFB - Personally Reviewed  Physical Exam   GEN: No acute distress.  Lying in bed.  Neck: No JVD Cardiac: RRR, no murmurs, rubs, or gallops.  Respiratory: Clear to auscultation bilaterally. GI: Soft, nontender, non-distended  MS: No edema; No deformity. Neuro:  Nonfocal  Psych: Normal affect   Labs    High Sensitivity Troponin:    Recent Labs  Lab 02/18/22 1442 02/19/22 1157 02/19/22 1519 02/19/22 1819  TROPONINIHS 374* 391* 348* 307*     Chemistry Recent Labs  Lab 02/18/22 1405 02/18/22 1453 02/19/22 1157  NA 136  --  138  K 3.5  --  3.6  CL 94*  --  98  CO2 29  --  27  GLUCOSE 275*  --  193*  BUN 21  --  27*  CREATININE 3.22*  --  4.28*  CALCIUM 9.4  --  9.8  MG  --  2.1  --   PROT 7.1  --  6.1*  ALBUMIN 2.7*  --  2.3*  AST 46*  --  38  ALT 28  --  20  ALKPHOS 166*  --  126  BILITOT 0.8  --  0.9  GFRNONAA 15*  --  10*  ANIONGAP 13  --  13    Lipids No results for input(s): "CHOL", "TRIG", "HDL", "LABVLDL", "LDLCALC", "CHOLHDL" in the last 168 hours.  Hematology Recent Labs  Lab 02/18/22 1405 02/19/22 1157  WBC 11.0* 9.6  RBC 3.97 3.38*  HGB 13.1 11.1*  HCT 39.5 33.8*  MCV 99.5 100.0  MCH 33.0 32.8  MCHC 33.2 32.8  RDW 14.6 14.7  PLT 221 191   Thyroid  Recent Labs  Lab 02/18/22 1600  TSH 6.264*    BNP Recent Labs  Lab 02/18/22 1405  BNP 1,707.5*    DDimer No results for input(s): "DDIMER" in the last 168 hours.   Radiology    ECHOCARDIOGRAM COMPLETE  Result Date: 02/19/2022    ECHOCARDIOGRAM REPORT   Patient Name:   MARYELIZABETH EBERLE Date of Exam: 02/19/2022 Medical Rec #:  [id number]              Height:       63.0 in Accession #:    [id number]             Weight:       132.3 lb Date of Birth:  1951/06/05              BSA:          1.622 m Patient Age:    67 years               BP:           103/34 mmHg Patient Gender: F                      HR:           79 bpm. Exam Location:  Inpatient Procedure: 2D Echo, Cardiac Doppler, Color Doppler and Intracardiac            Opacification Agent Indications:    Elevated troponin  History:        Patient has prior history of Echocardiogram examinations, most                 recent 02/13/2017. Risk Factors:Hypertension, Diabetes and                 Dyslipidemia.  Sonographer:    Clayton Lefort RDCS (AE) Referring Phys: [id number] [facility]  Sonographer Comments: Suboptimal apical window. IMPRESSIONS  1. Left ventricular ejection fraction, by estimation, is 55%. The left ventricle has normal function. The left ventricle has no regional wall motion abnormalities. Left ventricular diastolic parameters are consistent with Grade I diastolic dysfunction (impaired relaxation). Elevated left atrial pressure.  2. Right ventricular systolic function is normal. The right ventricular size is normal.  3. The mitral valve is abnormal. Mild to moderate mitral valve regurgitation. No evidence of mitral stenosis.  4. The aortic valve is tricuspid. There is mild calcification of the aortic valve. There is mild thickening of the aortic valve. Aortic valve regurgitation is mild to moderate. No aortic stenosis is present.  5. The inferior vena cava is normal in size with greater than 50% respiratory variability, suggesting right atrial pressure of 3 mmHg. FINDINGS  Left Ventricle: Left ventricular ejection fraction, by estimation, is 55%. The left ventricle has normal function. The left ventricle has no regional wall motion abnormalities. Definity contrast agent was given IV to delineate the left ventricular endocardial borders. The left ventricular internal cavity size was normal in size. There is no left ventricular hypertrophy. Left ventricular diastolic parameters are consistent with Grade I diastolic dysfunction (impaired relaxation). Elevated left atrial pressure. Right Ventricle: The right ventricular size is normal. Right vetricular wall thickness was not well visualized. Right ventricular systolic function is normal. Left Atrium: Left atrial size was normal in size. Right Atrium: Right atrial size was normal in size. Pericardium: There is no evidence of pericardial effusion. Mitral Valve:  The mitral valve is abnormal. There is mild thickening of the mitral valve leaflet(s). There is mild calcification of the mitral valve leaflet(s). Mild mitral annular  calcification. Mild to moderate mitral valve regurgitation. No evidence of mitral valve stenosis. Tricuspid Valve: The tricuspid valve is normal in structure. Tricuspid valve regurgitation is not demonstrated. No evidence of tricuspid stenosis. Aortic Valve: The aortic valve is tricuspid. There is mild calcification of the aortic valve. There is mild thickening of the aortic valve. There is mild aortic valve annular calcification. Aortic valve regurgitation is mild to moderate. Aortic regurgitation PHT measures 521 msec. No aortic stenosis is present. Aortic valve mean gradient measures 6.0 mmHg. Aortic valve peak gradient measures 9.9 mmHg. Aortic valve area, by VTI measures 1.50 cm. Pulmonic Valve: The pulmonic valve was not well visualized. Pulmonic valve regurgitation is not visualized. No evidence of pulmonic stenosis. Aorta: The aortic root is normal in size and structure. Venous: The inferior vena cava is normal in size with greater than 50% respiratory variability, suggesting right atrial pressure of 3 mmHg. IAS/Shunts: No atrial level shunt detected by color flow Doppler.  LEFT VENTRICLE PLAX 2D LVIDd:         5.00 cm   Diastology LVIDs:         3.70 cm   LV e' medial:    5.00 cm/s LV PW:         1.00 cm   LV E/e' medial:  17.6 LV IVS:        0.90 cm   LV e' lateral:   4.57 cm/s LVOT diam:     1.90 cm   LV E/e' lateral: 19.3 LV SV:         46 LV SV Index:   28 LVOT Area:     2.84 cm  RIGHT VENTRICLE RV Basal diam:  3.20 cm RV S prime:     13.80 cm/s TAPSE (M-mode): 2.3 cm LEFT ATRIUM             Index        RIGHT ATRIUM           Index LA diam:        3.90 cm 2.40 cm/m   RA Area:     10.50 cm LA Vol (A2C):   48.7 ml 30.02 ml/m  RA Volume:   21.80 ml  13.44 ml/m LA Vol (A4C):   48.3 ml 29.78 ml/m LA Biplane Vol: 49.2 ml 30.33 ml/m  AORTIC VALVE AV Area (Vmax):    1.48 cm AV Area (Vmean):   1.49 cm AV Area (VTI):     1.50 cm AV Vmax:           157.00 cm/s AV Vmean:          109.000 cm/s AV VTI:             0.305 m AV Peak Grad:      9.9 mmHg AV Mean Grad:      6.0 mmHg LVOT Vmax:         81.80 cm/s LVOT Vmean:        57.200 cm/s LVOT VTI:          0.161 m LVOT/AV VTI ratio: 0.53 AI PHT:            521 msec  AORTA Ao Root diam: 3.00 cm Ao Asc diam:  3.10 cm MITRAL VALVE MV Area (PHT): 4.60 cm     SHUNTS MV Decel Time: 165 msec  Systemic VTI:  0.16 m MV E velocity: 88.20 cm/s   Systemic Diam: 1.90 cm MV A velocity: 106.00 cm/s MV E/A ratio:  0.83 Carlyle Dolly MD Electronically signed by Carlyle Dolly MD Signature Date/Time: 02/19/2022/5:29:54 PM    Final    CT CHEST ABDOMEN PELVIS W CONTRAST  Result Date: 02/18/2022 CLINICAL DATA:  Sepsis. Diminished appetite for 2 weeks. Increased sweating. EXAM: CT CHEST, ABDOMEN, AND PELVIS WITH CONTRAST TECHNIQUE: Multidetector CT imaging of the chest, abdomen and pelvis was performed following the standard protocol during bolus administration of intravenous contrast. RADIATION DOSE REDUCTION: This exam was performed according to the departmental dose-optimization program which includes automated exposure control, adjustment of the mA and/or kV according to patient size and/or use of iterative reconstruction technique. CONTRAST:  33mL OMNIPAQUE IOHEXOL 300 MG/ML  SOLN COMPARISON:  Chest radiograph earlier today. Abdominopelvic CT 02/16/2018 FINDINGS: CT CHEST FINDINGS Cardiovascular: Mild cardiomegaly. There are coronary artery and pericardial calcifications. No pericardial effusion. Moderate atherosclerosis of the thoracic aorta without evidence of acute aortic finding. Vascular stent arising from the left subclavian artery coursing superiorly is only partially included in the field of view, but may be occluded, image 1 series 3. There is no obvious pulmonary embolus in the central most pulmonary arteries, although assessment for pulmonary embolus is limited. Mediastinum/Nodes: Scattered small mediastinal lymph nodes, not enlarged by size criteria. There are  mildly prominent bilateral hilar nodes. The esophagus is patulous and contains scattered intraluminal fluid. No wall thickening. No thyroid nodule. Lungs/Pleura: Moderate layering right pleural effusion with associated compressive atelectasis. Moderate left pleural effusion is partially loculated and courses into the fissure. There is associated compressive atelectasis. Calcified granuloma in the right upper lobe. Mild hazy ground-glass opacity in the dependent lungs. Mild left lower lobe bronchial thickening with occasional mucous plugging. No endobronchial lesion or debris in the more proximal bronchi. Musculoskeletal: Scattered Schmorl's nodes throughout the thoracic spine. There are no acute or suspicious osseous abnormalities. CT ABDOMEN PELVIS FINDINGS Hepatobiliary: No focal hepatic abnormality. Gallbladder physiologically distended, no calcified stone. No biliary dilatation. Pancreas: No ductal dilatation or inflammation. Spleen: Normal in size without focal abnormality. Adrenals/Urinary Tract: No adrenal nodule. Sequela of chronic renal disease with native renal atrophy. No hydronephrosis or renal inflammation. The urinary bladder is essentially empty, but thick walled. Stomach/Bowel: Fluid within the stomach, no definite gastric wall thickening. Occasional fluid-filled loops of small bowel. There is suggestion of mild diffuse small bowel wall thickening. No obstruction. There is colonic wall thickening with mild pericolonic edema involving the transverse colon. Possible additional areas of colonic inflammation in the sigmoid. No bowel pneumatosis. The appendix is not visualized. Vascular/Lymphatic: Advanced aortic atherosclerosis. Moderate atherosclerosis of aortic branches. No evidence of embolic disease. There may be areas of stenosis of the distal SMA related to atheromatous plaque. No bulky adenopathy. Reproductive: Diffuse uterine vascular calcifications. No adnexal mass. Other: Lower abdominal  ventral abdominal wall hernia contains only fat. Small right inguinal hernia contains small amount of free fluid. No free air or ascites. No abdominopelvic collection. Musculoskeletal: Vague sclerotic focus in the right iliac bone is unchanged. Remote right posterior lower rib fracture. IMPRESSION: 1. Colonic wall thickening with mild pericolonic edema involving the transverse colon, suspicious for colitis. Possible additional areas of colonic inflammation in the sigmoid colon. There is also mild diffuse small bowel wall thickening small possible enteritis. 2. Moderate layering right pleural effusion with associated compressive atelectasis. Moderate left pleural effusion is partially loculated and courses into the fissure. 3. Mild  hazy ground-glass opacity in the dependent lungs, atelectasis or mild pulmonary edema. 4. Patulous esophagus with scattered intraluminal fluid, suggesting reflux. 5. Thick-walled urinary bladder, chronic. 6. Additional chronic findings are stable as described. Aortic Atherosclerosis (ICD10-I70.0). Electronically Signed   By: Keith Rake M.D.   On: 02/18/2022 21:22   DG Chest 2 View  Result Date: 02/18/2022 CLINICAL DATA:  Not eating.  Wheezing. EXAM: CHEST - 2 VIEW COMPARISON:  09/06/2019. FINDINGS: Cardiac silhouette is mildly enlarged. No mediastinal or hilar masses. No evidence of adenopathy. Small effusions. Mild lung base opacity consistent with atelectasis. Remainder of the lungs is clear. No pneumothorax. Stable left subclavian to axillary stent. Skeletal structures are intact. IMPRESSION: 1. No acute cardiopulmonary disease. 2. Small effusions with mild basilar atelectasis. No convincing pneumonia or pulmonary edema. Electronically Signed   By: Lajean Manes M.D.   On: 02/18/2022 16:23   CT Head Wo Contrast  Result Date: 02/18/2022 CLINICAL DATA:  Mental status change, unknown cause EXAM: CT HEAD WITHOUT CONTRAST TECHNIQUE: Contiguous axial images were obtained from  the base of the skull through the vertex without intravenous contrast. RADIATION DOSE REDUCTION: This exam was performed according to the departmental dose-optimization program which includes automated exposure control, adjustment of the mA and/or kV according to patient size and/or use of iterative reconstruction technique. COMPARISON:  Head CT 07/15/2020 FINDINGS: Brain: Stable degree of atrophy and chronic small vessel ischemia from prior exam. Remote lacunar infarcts in the right external capsule. Remote infarct in the right cerebellum and high right parietal lobe. No evidence of acute ischemia. No hemorrhage or subdural collection. No hydrocephalus. No midline shift or mass effect. Enlarged partially empty sella, unchanged. Vascular: Atherosclerosis of skullbase vasculature without hyperdense vessel or abnormal calcification. Skull: No fracture or focal lesion. Sinuses/Orbits: Chronic mucosal thickening of ethmoid air cells with frothy debris in the sphenoid sinus. Postsurgical change in both globes. Other: None. IMPRESSION: 1. No acute intracranial abnormality. 2. Stable atrophy, chronic small vessel ischemia, and remote infarcts. Electronically Signed   By: Keith Rake M.D.   On: 02/18/2022 16:22    Cardiac Studies   Echo 02/19/2022 Study Result   1. Left ventricular ejection fraction, by estimation, is 55%. The left  ventricle has normal function. The left ventricle has no regional wall  motion abnormalities. Left ventricular diastolic parameters are consistent  with Grade I diastolic dysfunction  (impaired relaxation). Elevated left atrial pressure.   2. Right ventricular systolic function is normal. The right ventricular  size is normal.   3. The mitral valve is abnormal. Mild to moderate mitral valve  regurgitation. No evidence of mitral stenosis.   4. The aortic valve is tricuspid. There is mild calcification of the  aortic valve. There is mild thickening of the aortic valve. Aortic  valve  regurgitation is mild to moderate. No aortic stenosis is present.   5. The inferior vena cava is normal in size with greater than 50%  respiratory variability, suggesting right atrial pressure of 3 mmHg.     Patient Profile     71 y.o. female with PMH of HTN, HLD, IDDM 2 and ESRD on HD who presented with poor oral intake, failure to thrive and diaphoresis. Cardiology service consulted for elevated troponin. Hospitalist currently treating the patient for enterocolitis seen on CT of abdomen. C diff negative.   Assessment & Plan    Elevated troponin  - serial trop flat 374 --> 391 --> 348 --> 307, inconsistent with ACS pattern  - although  initial note by cardiology fellow mentioned q wave in the inferior leads, however EKG was reviewed by Dr. Sallyanne Kuster who felt patient did not have q wave, instead has diminutive R wave in the inferior leads  - patient denies any recent chest pain, only abdominal pain.   - echo obtained on 02/19/2022 showed normal EF 55%, no significant wall motion abnormality.  - per granddaughter, patient has severe dementia at baseline and sometimes cannot even recognize family members (this is chronic) whereas on some days she is able to recognize her family. Her home medication are usually given to her by her son. She would be a very poor candidate for invasive study. I suspect her elevation of troponin is more consistent with demand ischemia. Would not recommend further ischemic workup   Enterocolitis: on metronidazole and rocephin. Seen by GI  HTN: off of home lisinopril, started on midodrine during this hospitalization  HLD: consider restart home lipitor  IDDM 2  ESRD on HD: followed by nephrology      For questions or updates, please contact [facility] Please consult [email or web address] for contact info under        Signed, Almyra Deforest, [place]  02/20/2022, 11:28 AM

## 2022-02-20 NOTE — Assessment & Plan Note (Addendum)
Delirium precautions She's delirious, suspect related to her underlying dementia Her lack of PO intake is also likely related to her dementia

## 2022-02-20 NOTE — Progress Notes (Signed)
Occupational Therapy Evaluation Patient Details Name: Sarah Kelley MRN: [id number] DOB: Jun 14, 1951 Today's Date: 02/20/2022   History of Present Illness Pt is a 71 y/o female admitted secondary to decreased appetite and new difficulty swallowing. Thought to have enterocolitis. PMH includes HTN, DM, ESRD on HD.   Clinical Impression   PTA, pt lives with family, requires assistance for all transfers/ADLs (including self feeding) at baseline. Family present and engaged in education during session. Collaborated on: helpful DME at home, repositioning bed level for skin integrity/comfort, upright positioning for all meals, how to assist with ADLs bed level especially if shower transfers become too challenging. As pt is at functional baseline, unable to consistently follow commands for meaningful participation, OT to sign off at the acute level.      Recommendations for follow up therapy are one component of a multi-disciplinary discharge planning process, led by the attending physician.  Recommendations may be updated based on patient status, additional functional criteria and insurance authorization.   Follow Up Recommendations  No OT follow up    Assistance Recommended at Discharge Frequent or constant Supervision/Assistance  Patient can return home with the following Two people to help with walking and/or transfers;A lot of help with bathing/dressing/bathroom;Assistance with feeding    Functional Status Assessment  Patient has not had a recent decline in their functional status  Equipment Recommendations  Hospital bed    Recommendations for Other Services       Precautions / Restrictions Precautions Precautions: Fall Restrictions Weight Bearing Restrictions: No      Mobility Bed Mobility Overal bed mobility: Needs Assistance             General bed mobility comments: Total A x 2 for repositioning, scooting up d/t inconsistent following of commands    Transfers                           Balance                                           ADL either performed or assessed with clinical judgement   ADL Overall ADL's : Needs assistance/impaired                                       General ADL Comments: Overall Total A for all ADLs     Vision Ability to See in Adequate Light: 1 Impaired Patient Visual Report: No change from baseline Vision Assessment?: No apparent visual deficits     Perception     Praxis      Pertinent Vitals/Pain Pain Assessment Pain Assessment: Faces Faces Pain Scale: Hurts little more Pain Location: pt unable to specify Pain Descriptors / Indicators: Guarding, Grimacing, Moaning Pain Intervention(s): Monitored during session, Repositioned     Hand Dominance Right   Extremity/Trunk Assessment Upper Extremity Assessment Upper Extremity Assessment: Generalized weakness;Difficult to assess due to impaired cognition   Lower Extremity Assessment Lower Extremity Assessment: Defer to PT evaluation   Cervical / Trunk Assessment Cervical / Trunk Assessment: Kyphotic   Communication Communication Communication: Prefers language other than [place];Interpreter utilized   Cognition Arousal/Alertness: Awake/alert Behavior During Therapy: Flat affect Overall Cognitive Status: History of cognitive impairments - at baseline  General Comments: does not vocalize much at baseline, inconsistent following of commands and resistant to movement at times     General Comments  Family present, hands on to assist and inquiring about coverage for medication/diapers    Exercises     Shoulder Instructions      Home Living Family/patient expects to be discharged to:: Private residence Living Arrangements: Children Available Help at Discharge: Family;Available 24 hours/day Type of Home: Mobile home Home Access: Ramped entrance     Home  Layout: One level     Bathroom Shower/Tub: Occupational psychologist: Handicapped height     Home Equipment: Shower seat;Wheelchair - manual          Prior Functioning/Environment Prior Level of Function : Needs assist             Mobility Comments: Family assists with all transfers OOB. Son carries pt into bathroom to sit on shower chair for bathing tasks. ADLs Comments: Assist with all ADLs, including self feeding. assists pt into shower, 2 people assist with LB dressing (one to stand pt and one to assist in pulling pants up)        OT Problem List: Decreased cognition;Decreased strength;Pain      OT Treatment/Interventions:      OT Goals(Current goals can be found in the care plan section) Acute Rehab OT Goals Patient Stated Goal: family interested in hospital bed, assistance for meds/briefs at home OT Goal Formulation: All assessment and education complete, DC therapy  OT Frequency:      Co-evaluation              AM-PAC OT "6 Clicks" Daily Activity     Outcome Measure Help from another person eating meals?: Total Help from another person taking care of personal grooming?: Total Help from another person toileting, which includes using toliet, bedpan, or urinal?: Total Help from another person bathing (including washing, rinsing, drying)?: Total Help from another person to put on and taking off regular upper body clothing?: Total Help from another person to put on and taking off regular lower body clothing?: Total 6 Click Score: 6   End of Session    Activity Tolerance: Patient tolerated treatment well Patient left: in bed;with call bell/phone within reach;with family/visitor present  OT Visit Diagnosis: Other abnormalities of gait and mobility (R26.89)                Time: 4709-6283 OT Time Calculation (min): 16 min Charges:  OT General Charges $OT Visit: 1 Visit OT Evaluation $OT Eval Low Complexity: 1 Low  Malachy Chamber, OTR/L Acute Rehab  Services Office: [phone]   Sarah Kelley 02/20/2022, 2:31 PM

## 2022-02-20 NOTE — Progress Notes (Signed)
Progress Note   Assessment    Diarrhea, CT showing mild SB thickening and focal pericolonic edema. Suspect this due to volume overload.  Dysphagia, patulous esophagus on CT, likely chronic. SLP evaluation grossly WNL   Recommendations   If diarrhea improves can stop antibiotics. No further evaluation planned. Barium esophagram when awake, alert, able to cooperate   Chief Complaint   Diarrhea improved. Little verbal communication-yes and no. Family at bedside answers most questions for the patient.  Spanish interpreter present for visit.   Vital signs in last 24 hours: Temp:  [98.2 F (36.8 C)-99.7 F (37.6 C)] 98.8 F (37.1 C) (08/21 0731) Pulse Rate:  [69-88] 70 (08/21 0731) Resp:  [13-26] 20 (08/21 0731) BP: (91-141)/(25-79) 123/25 (08/21 0731) SpO2:  [92 %-98 %] 98 % (08/21 0731) Weight:  [56.7 kg] 56.7 kg (08/21 0659) Last BM Date : 02/19/22  General: Well-developed, elderly in NAD Heart:  Regular rate and rhythm; no murmurs Chest: Clear to ascultation bilaterally Abdomen:  Soft, minimal generalized tenderness and nondistended. Normal bowel sounds, without guarding, and without rebound.   Extremities:  Without edema. Neurologic:  Little verbal communication grossly normal neurologically. Psych:  Somnolent   Intake/Output from previous day: 08/20 0701 - 08/21 0700 In: 530.7 [P.O.:50; IV Piggyback:480.7] Out: -  Intake/Output this shift: No intake/output data recorded.  Lab Results: Recent Labs    02/18/22 1405 02/19/22 1157  WBC 11.0* 9.6  HGB 13.1 11.1*  HCT 39.5 33.8*  PLT 221 191   BMET Recent Labs    02/18/22 1405 02/19/22 1157  NA 136 138  K 3.5 3.6  CL 94* 98  CO2 29 27  GLUCOSE 275* 193*  BUN 21 27*  CREATININE 3.22* 4.28*  CALCIUM 9.4 9.8   LFT Recent Labs    02/18/22 1405 02/19/22 1157  PROT 7.1 6.1*  ALBUMIN 2.7* 2.3*  AST 46* 38  ALT 28 20  ALKPHOS 166* 126  BILITOT 0.8 0.9  BILIDIR 0.3*  --   IBILI 0.5  --     PT/INR No results for input(s): "LABPROT", "INR" in the last 72 hours. Hepatitis Panel No results for input(s): "HEPBSAG", "HCVAB", "HEPAIGM", "HEPBIGM" in the last 72 hours.  Studies/Results: ECHOCARDIOGRAM COMPLETE  Result Date: 02/19/2022    ECHOCARDIOGRAM REPORT   Patient Name:   EPIFANIA LITTRELL Date of Exam: 02/19/2022 Medical Rec #:  [id number]              Height:       63.0 in Accession #:    [id number]             Weight:       132.3 lb Date of Birth:  03-29-1951              BSA:          1.622 m Patient Age:    71 years               BP:           103/34 mmHg Patient Gender: F                      HR:           79 bpm. Exam Location:  Inpatient Procedure: 2D Echo, Cardiac Doppler, Color Doppler and Intracardiac            Opacification Agent Indications:    Elevated troponin  History:  Patient has prior history of Echocardiogram examinations, most                 recent 02/13/2017. Risk Factors:Hypertension, Diabetes and                 Dyslipidemia.  Sonographer:    Clayton Lefort RDCS (AE) Referring Phys: [id number] [facility]  Sonographer Comments: Suboptimal apical window. IMPRESSIONS  1. Left ventricular ejection fraction, by estimation, is 55%. The left ventricle has normal function. The left ventricle has no regional wall motion abnormalities. Left ventricular diastolic parameters are consistent with Grade I diastolic dysfunction (impaired relaxation). Elevated left atrial pressure.  2. Right ventricular systolic function is normal. The right ventricular size is normal.  3. The mitral valve is abnormal. Mild to moderate mitral valve regurgitation. No evidence of mitral stenosis.  4. The aortic valve is tricuspid. There is mild calcification of the aortic valve. There is mild thickening of the aortic valve. Aortic valve regurgitation is mild to moderate. No aortic stenosis is present.  5. The inferior vena cava is normal in size with greater than 50% respiratory variability,  suggesting right atrial pressure of 3 mmHg. FINDINGS  Left Ventricle: Left ventricular ejection fraction, by estimation, is 55%. The left ventricle has normal function. The left ventricle has no regional wall motion abnormalities. Definity contrast agent was given IV to delineate the left ventricular endocardial borders. The left ventricular internal cavity size was normal in size. There is no left ventricular hypertrophy. Left ventricular diastolic parameters are consistent with Grade I diastolic dysfunction (impaired relaxation). Elevated left atrial pressure. Right Ventricle: The right ventricular size is normal. Right vetricular wall thickness was not well visualized. Right ventricular systolic function is normal. Left Atrium: Left atrial size was normal in size. Right Atrium: Right atrial size was normal in size. Pericardium: There is no evidence of pericardial effusion. Mitral Valve: The mitral valve is abnormal. There is mild thickening of the mitral valve leaflet(s). There is mild calcification of the mitral valve leaflet(s). Mild mitral annular calcification. Mild to moderate mitral valve regurgitation. No evidence of mitral valve stenosis. Tricuspid Valve: The tricuspid valve is normal in structure. Tricuspid valve regurgitation is not demonstrated. No evidence of tricuspid stenosis. Aortic Valve: The aortic valve is tricuspid. There is mild calcification of the aortic valve. There is mild thickening of the aortic valve. There is mild aortic valve annular calcification. Aortic valve regurgitation is mild to moderate. Aortic regurgitation PHT measures 521 msec. No aortic stenosis is present. Aortic valve mean gradient measures 6.0 mmHg. Aortic valve peak gradient measures 9.9 mmHg. Aortic valve area, by VTI measures 1.50 cm. Pulmonic Valve: The pulmonic valve was not well visualized. Pulmonic valve regurgitation is not visualized. No evidence of pulmonic stenosis. Aorta: The aortic root is normal in size  and structure. Venous: The inferior vena cava is normal in size with greater than 50% respiratory variability, suggesting right atrial pressure of 3 mmHg. IAS/Shunts: No atrial level shunt detected by color flow Doppler.  LEFT VENTRICLE PLAX 2D LVIDd:         5.00 cm   Diastology LVIDs:         3.70 cm   LV e' medial:    5.00 cm/s LV PW:         1.00 cm   LV E/e' medial:  17.6 LV IVS:        0.90 cm   LV e' lateral:   4.57 cm/s LVOT diam:  1.90 cm   LV E/e' lateral: 19.3 LV SV:         46 LV SV Index:   28 LVOT Area:     2.84 cm  RIGHT VENTRICLE RV Basal diam:  3.20 cm RV S prime:     13.80 cm/s TAPSE (M-mode): 2.3 cm LEFT ATRIUM             Index        RIGHT ATRIUM           Index LA diam:        3.90 cm 2.40 cm/m   RA Area:     10.50 cm LA Vol (A2C):   48.7 ml 30.02 ml/m  RA Volume:   21.80 ml  13.44 ml/m LA Vol (A4C):   48.3 ml 29.78 ml/m LA Biplane Vol: 49.2 ml 30.33 ml/m  AORTIC VALVE AV Area (Vmax):    1.48 cm AV Area (Vmean):   1.49 cm AV Area (VTI):     1.50 cm AV Vmax:           157.00 cm/s AV Vmean:          109.000 cm/s AV VTI:            0.305 m AV Peak Grad:      9.9 mmHg AV Mean Grad:      6.0 mmHg LVOT Vmax:         81.80 cm/s LVOT Vmean:        57.200 cm/s LVOT VTI:          0.161 m LVOT/AV VTI ratio: 0.53 AI PHT:            521 msec  AORTA Ao Root diam: 3.00 cm Ao Asc diam:  3.10 cm MITRAL VALVE MV Area (PHT): 4.60 cm     SHUNTS MV Decel Time: 165 msec     Systemic VTI:  0.16 m MV E velocity: 88.20 cm/s   Systemic Diam: 1.90 cm MV A velocity: 106.00 cm/s MV E/A ratio:  0.83 Carlyle Dolly MD Electronically signed by Carlyle Dolly MD Signature Date/Time: 02/19/2022/5:29:54 PM    Final    CT CHEST ABDOMEN PELVIS W CONTRAST  Result Date: 02/18/2022 CLINICAL DATA:  Sepsis. Diminished appetite for 2 weeks. Increased sweating. EXAM: CT CHEST, ABDOMEN, AND PELVIS WITH CONTRAST TECHNIQUE: Multidetector CT imaging of the chest, abdomen and pelvis was performed following the standard  protocol during bolus administration of intravenous contrast. RADIATION DOSE REDUCTION: This exam was performed according to the departmental dose-optimization program which includes automated exposure control, adjustment of the mA and/or kV according to patient size and/or use of iterative reconstruction technique. CONTRAST:  18mL OMNIPAQUE IOHEXOL 300 MG/ML  SOLN COMPARISON:  Chest radiograph earlier today. Abdominopelvic CT 02/16/2018 FINDINGS: CT CHEST FINDINGS Cardiovascular: Mild cardiomegaly. There are coronary artery and pericardial calcifications. No pericardial effusion. Moderate atherosclerosis of the thoracic aorta without evidence of acute aortic finding. Vascular stent arising from the left subclavian artery coursing superiorly is only partially included in the field of view, but may be occluded, image 1 series 3. There is no obvious pulmonary embolus in the central most pulmonary arteries, although assessment for pulmonary embolus is limited. Mediastinum/Nodes: Scattered small mediastinal lymph nodes, not enlarged by size criteria. There are mildly prominent bilateral hilar nodes. The esophagus is patulous and contains scattered intraluminal fluid. No wall thickening. No thyroid nodule. Lungs/Pleura: Moderate layering right pleural effusion with associated compressive atelectasis. Moderate left pleural effusion is partially  loculated and courses into the fissure. There is associated compressive atelectasis. Calcified granuloma in the right upper lobe. Mild hazy ground-glass opacity in the dependent lungs. Mild left lower lobe bronchial thickening with occasional mucous plugging. No endobronchial lesion or debris in the more proximal bronchi. Musculoskeletal: Scattered Schmorl's nodes throughout the thoracic spine. There are no acute or suspicious osseous abnormalities. CT ABDOMEN PELVIS FINDINGS Hepatobiliary: No focal hepatic abnormality. Gallbladder physiologically distended, no calcified stone. No  biliary dilatation. Pancreas: No ductal dilatation or inflammation. Spleen: Normal in size without focal abnormality. Adrenals/Urinary Tract: No adrenal nodule. Sequela of chronic renal disease with native renal atrophy. No hydronephrosis or renal inflammation. The urinary bladder is essentially empty, but thick walled. Stomach/Bowel: Fluid within the stomach, no definite gastric wall thickening. Occasional fluid-filled loops of small bowel. There is suggestion of mild diffuse small bowel wall thickening. No obstruction. There is colonic wall thickening with mild pericolonic edema involving the transverse colon. Possible additional areas of colonic inflammation in the sigmoid. No bowel pneumatosis. The appendix is not visualized. Vascular/Lymphatic: Advanced aortic atherosclerosis. Moderate atherosclerosis of aortic branches. No evidence of embolic disease. There may be areas of stenosis of the distal SMA related to atheromatous plaque. No bulky adenopathy. Reproductive: Diffuse uterine vascular calcifications. No adnexal mass. Other: Lower abdominal ventral abdominal wall hernia contains only fat. Small right inguinal hernia contains small amount of free fluid. No free air or ascites. No abdominopelvic collection. Musculoskeletal: Vague sclerotic focus in the right iliac bone is unchanged. Remote right posterior lower rib fracture. IMPRESSION: 1. Colonic wall thickening with mild pericolonic edema involving the transverse colon, suspicious for colitis. Possible additional areas of colonic inflammation in the sigmoid colon. There is also mild diffuse small bowel wall thickening small possible enteritis. 2. Moderate layering right pleural effusion with associated compressive atelectasis. Moderate left pleural effusion is partially loculated and courses into the fissure. 3. Mild hazy ground-glass opacity in the dependent lungs, atelectasis or mild pulmonary edema. 4. Patulous esophagus with scattered intraluminal  fluid, suggesting reflux. 5. Thick-walled urinary bladder, chronic. 6. Additional chronic findings are stable as described. Aortic Atherosclerosis (ICD10-I70.0). Electronically Signed   By: Keith Rake M.D.   On: 02/18/2022 21:22   DG Chest 2 View  Result Date: 02/18/2022 CLINICAL DATA:  Not eating.  Wheezing. EXAM: CHEST - 2 VIEW COMPARISON:  09/06/2019. FINDINGS: Cardiac silhouette is mildly enlarged. No mediastinal or hilar masses. No evidence of adenopathy. Small effusions. Mild lung base opacity consistent with atelectasis. Remainder of the lungs is clear. No pneumothorax. Stable left subclavian to axillary stent. Skeletal structures are intact. IMPRESSION: 1. No acute cardiopulmonary disease. 2. Small effusions with mild basilar atelectasis. No convincing pneumonia or pulmonary edema. Electronically Signed   By: Lajean Manes M.D.   On: 02/18/2022 16:23   CT Head Wo Contrast  Result Date: 02/18/2022 CLINICAL DATA:  Mental status change, unknown cause EXAM: CT HEAD WITHOUT CONTRAST TECHNIQUE: Contiguous axial images were obtained from the base of the skull through the vertex without intravenous contrast. RADIATION DOSE REDUCTION: This exam was performed according to the departmental dose-optimization program which includes automated exposure control, adjustment of the mA and/or kV according to patient size and/or use of iterative reconstruction technique. COMPARISON:  Head CT 07/15/2020 FINDINGS: Brain: Stable degree of atrophy and chronic small vessel ischemia from prior exam. Remote lacunar infarcts in the right external capsule. Remote infarct in the right cerebellum and high right parietal lobe. No evidence of acute ischemia. No hemorrhage or subdural  collection. No hydrocephalus. No midline shift or mass effect. Enlarged partially empty sella, unchanged. Vascular: Atherosclerosis of skullbase vasculature without hyperdense vessel or abnormal calcification. Skull: No fracture or focal lesion.  Sinuses/Orbits: Chronic mucosal thickening of ethmoid air cells with frothy debris in the sphenoid sinus. Postsurgical change in both globes. Other: None. IMPRESSION: 1. No acute intracranial abnormality. 2. Stable atrophy, chronic small vessel ischemia, and remote infarcts. Electronically Signed   By: Keith Rake M.D.   On: 02/18/2022 16:22      LOS: 1 day   Sayla Golonka T. Fuller Plan, MD 02/20/2022, 11:23 AM See [facility], to contact our on call provider

## 2022-02-20 NOTE — Progress Notes (Signed)
Initial Nutrition Assessment  DOCUMENTATION CODES:   Non-severe (moderate) malnutrition in context of chronic illness  INTERVENTION:  Boost Breeze po TID, each supplement provides 250 kcal and 9 grams of protein 30 ml ProSource Plus BID, each supplement provides 100 kcals and 15 grams protein.  Renal MVI with mienrals daily  NUTRITION DIAGNOSIS:   Moderate Malnutrition related to chronic illness (ESRD, dementia) as evidenced by mild fat depletion, severe muscle depletion, mild muscle depletion.  GOAL:   Patient will meet greater than or equal to 90% of their needs  MONITOR:   PO intake, Supplement acceptance, Diet advancement, Labs, Weight trends, I & O's  REASON FOR ASSESSMENT:   Malnutrition Screening Tool    ASSESSMENT:   Pt admitted from home with 2 weeks of poor PO intake, diaphoresis and profuse diarrhea over the last 3-4 days. PMH significant for ESRD on TTS dialysis, HTN, HLD, DM on insulin.  Per review of chart, pt noted to have dysphagia. S/p CT with findings of fluid filled esophagus. Per ST no s/s of oropharyngeal dysphagia. GI following- if continues to improve can slowly advance diet. Plans for possible barium esophagram in the next 2-3 days.   Per Nephrology, plans for next HD (on schedule) 8/22.  Spoke with patient's daughter at bedside utilizing [place] services [phone]). Pt's daughter provides most history as pt noted ot have severe underlying dementia and is a poor historian. Her daughter reports that she used to eat a little bit of everything in small amounts. Within the past 2 weeks she has not eaten any solid foods but has been able to tolerate liquids.   They are agreeable to addition of nutrition supplements. Pt expressing hunger at time of visit. When returning to room to provide Boost Breeze for patient, her granddaughter was present. Explained to her the benefit of the nutrition supplements. She also reports that the patient had had an  episode of emesis last night. They were asking if she could be readjusted in bed as her hips and back felt sore. Spoke with RN in the hall.   Pt's daughter uncertain of weight loss but upon visual assessment, she reports that she looks slimmer. Post HD wt 53.3 kg on 8/19. Per Nephrology, last week EDW was lowered 3 kg.  Medications:SSI 0-6 units TID, semglee 8 units qhs IV drips: abx, ferrlecit  Labs: potasium 3.1 (L), BUN 35, Cr 5.93, phos 6.0 (H), GFR 7, HgbA1c 7.7%, CBG's 181-277 x24 hours  UOP: 35ml x24 hours I/O's: +535ml since admission  NUTRITION - FOCUSED PHYSICAL EXAM:  Flowsheet Row Most Recent Value  Orbital Region No depletion  Upper Arm Region Mild depletion  Thoracic and Lumbar Region No depletion  Buccal Region Mild depletion  Temple Region Mild depletion  Clavicle Bone Region No depletion  Clavicle and Acromion Bone Region No depletion  Scapular Bone Region No depletion  Dorsal Hand No depletion  Patellar Region Severe depletion  Anterior Thigh Region Severe depletion  Posterior Calf Region Moderate depletion  Edema (RD Assessment) None  Hair Reviewed  Eyes Reviewed  Mouth Reviewed  Skin Reviewed  Nails Reviewed       Diet Order:   Diet Order             Diet full liquid Room service appropriate? Yes; Fluid consistency: Thin  Diet effective 0500 tomorrow           Diet clear liquid Room service appropriate? Yes; Fluid consistency: Thin  Diet effective now  EDUCATION NEEDS:   No education needs have been identified at this time  Skin:  Skin Assessment: Reviewed RN Assessment  Last BM:  8/20 (type 7)  Height:   Ht Readings from Last 1 Encounters:  02/20/22 5' (1.524 m)    Weight:   Wt Readings from Last 1 Encounters:  02/20/22 56.7 kg   BMI:  Body mass index is 24.41 kg/m.  Estimated Nutritional Needs:   Kcal:  1500-1700  Protein:  75-90g  Fluid:  1L + UOP  Clayborne Dana, RDN, LDN Clinical Nutrition

## 2022-02-20 NOTE — Assessment & Plan Note (Signed)
-   Likely in part secondary to GI losses. -Defer repletion to nephrology as patient ESRD on HD.

## 2022-02-20 NOTE — Progress Notes (Signed)
Interventional Radiology Brief Note:  PA spoke with RN this AM.  Confirmed patient has working IV access x2.  Order for central line discontinued, please reconsult IR if needed.   Brynda Greathouse, MS RD PA-C

## 2022-02-20 NOTE — Evaluation (Signed)
Physical Therapy Evaluation Patient Details Name: Sarah Kelley MRN: [id number] DOB: [birth year] Today's Date: 02/20/2022  History of Present Illness  Pt is a 71 y/o female admitted secondary to decreased appetite and new difficulty swallowing. Thought to have enterocolitis. PMH includes HTN, DM, ESRD on HD.  Clinical Impression  Pt admitted secondary to problem above with deficits below. Pt requiring max to total A for bed mobility tasks. Per family pt is max to total A for mobility and ADL tasks. Reports transfers are getting more difficult, so recommend hoyer lift to increase safety with transfers. Would also benefit from hospital bed at home. Discussed with family to get new cushion for [facility] to help with pressure relief and family reports they plan to go where they bought WC to get it. No further acute PT needs at this time as pt close to baseline and has necessary support. Will sign off. If needs change, please re-consult.        Recommendations for follow up therapy are one component of a multi-disciplinary discharge planning process, led by the attending physician.  Recommendations may be updated based on patient status, additional functional criteria and insurance authorization.  Follow Up Recommendations No PT follow up      Assistance Recommended at Discharge Frequent or constant Supervision/Assistance  Patient can return home with the following  A lot of help with walking and/or transfers;A lot of help with bathing/dressing/bathroom;Assistance with cooking/housework;Assist for transportation;Help with stairs or ramp for entrance;Assistance with feeding;Direct supervision/assist for medications management;Direct supervision/assist for financial management    Equipment Recommendations Hospital bed;Other (comment) (hoyer lift with pad)  Recommendations for Other Services       Functional Status Assessment Patient has not had a recent decline in their functional status      Precautions / Restrictions Precautions Precautions: Fall Restrictions Weight Bearing Restrictions: No      Mobility  Bed Mobility Overal bed mobility: Needs Assistance Bed Mobility: Supine to Sit, Sit to Supine     Supine to sit: Max assist Sit to supine: Total assist   General bed mobility comments: Max A for trunk and LE assist to come to sitting. Mod A for sitting balance. Total A to return to supine.    Transfers                        Ambulation/Gait                  Stairs            Wheelchair Mobility    Modified Rankin (Stroke Patients Only)       Balance Overall balance assessment: Needs assistance Sitting-balance support: Bilateral upper extremity supported Sitting balance-Leahy Scale: Poor Sitting balance - Comments: Up to mod A for sitting balance                                     Pertinent Vitals/Pain Pain Assessment Pain Assessment: Faces Faces Pain Scale: Hurts little more Pain Location: bottom Pain Descriptors / Indicators: Guarding, Grimacing Pain Intervention(s): Limited activity within patient's tolerance, Monitored during session, Repositioned    Home Living Family/patient expects to be discharged to:: Private residence Living Arrangements: Children Available Help at Discharge: Family;Available 24 hours/day Type of Home: Mobile home Home Access: Ramped entrance       Home Layout: One level Home Equipment: Shower seat;Wheelchair - manual  Prior Function Prior Level of Function : Needs assist             Mobility Comments: Family assists with all transfers and family pushes her in her [facility] ADLs Comments: Family assists with all ADLs. Family feeds her     Hand Dominance        Extremity/Trunk Assessment   Upper Extremity Assessment Upper Extremity Assessment: Generalized weakness    Lower Extremity Assessment Lower Extremity Assessment: Generalized weakness    Cervical  / Trunk Assessment Cervical / Trunk Assessment: Kyphotic  Communication   Communication: Prefers language other than English (family requesting to interpret)  Cognition Arousal/Alertness: Awake/alert Behavior During Therapy: WFL for tasks assessed/performed Overall Cognitive Status: History of cognitive impairments - at baseline                                          General Comments General comments (skin integrity, edema, etc.): Pt family present and reports plan is to go home. Reports they would be interested in hospital bed and hoyer lift as it has been hard to lift her these days.    Exercises     Assessment/Plan    PT Assessment Patient does not need any further PT services  PT Problem List         PT Treatment Interventions      PT Goals (Current goals can be found in the Care Plan section)  Acute Rehab PT Goals Patient Stated Goal: to go home per family PT Goal Formulation: With family Time For Goal Achievement: 03/06/22 Potential to Achieve Goals: Good    Frequency       Co-evaluation               AM-PAC PT "6 Clicks" Mobility  Outcome Measure Help needed turning from your back to your side while in a flat bed without using bedrails?: A Lot Help needed moving from lying on your back to sitting on the side of a flat bed without using bedrails?: Total Help needed moving to and from a bed to a chair (including a wheelchair)?: Total Help needed standing up from a chair using your arms (e.g., wheelchair or bedside chair)?: Total Help needed to walk in hospital room?: Total Help needed climbing 3-5 steps with a railing? : Total 6 Click Score: 7    End of Session   Activity Tolerance: Patient limited by pain;Patient limited by fatigue Patient left: in bed;with call bell/phone within reach;with bed alarm set;with family/visitor present Nurse Communication: Mobility status PT Visit Diagnosis: Other abnormalities of gait and mobility  (R26.89);Difficulty in walking, not elsewhere classified (R26.2)    Time: 3009-2330 PT Time Calculation (min) (ACUTE ONLY): 12 min   Charges:   PT Evaluation $PT Eval Moderate Complexity: 1 Mod          Reuel Derby, PT, DPT  Acute Rehabilitation Services  Office: [phone]   Rudean Hitt 02/20/2022, 10:52 AM

## 2022-02-21 DIAGNOSIS — E440 Moderate protein-calorie malnutrition: Secondary | ICD-10-CM | POA: Insufficient documentation

## 2022-02-21 LAB — CBC WITH DIFFERENTIAL/PLATELET
Abs Immature Granulocytes: 0.05 10*3/uL (ref 0.00–0.07)
Basophils Absolute: 0.1 10*3/uL (ref 0.0–0.1)
Basophils Relative: 1 %
Eosinophils Absolute: 0.2 10*3/uL (ref 0.0–0.5)
Eosinophils Relative: 3 %
HCT: 33.8 % — ABNORMAL LOW (ref 36.0–46.0)
Hemoglobin: 11 g/dL — ABNORMAL LOW (ref 12.0–15.0)
Immature Granulocytes: 1 %
Lymphocytes Relative: 15 %
Lymphs Abs: 1.1 10*3/uL (ref 0.7–4.0)
MCH: 32.5 pg (ref 26.0–34.0)
MCHC: 32.5 g/dL (ref 30.0–36.0)
MCV: 100 fL (ref 80.0–100.0)
Monocytes Absolute: 0.6 10*3/uL (ref 0.1–1.0)
Monocytes Relative: 8 %
Neutro Abs: 5.5 10*3/uL (ref 1.7–7.7)
Neutrophils Relative %: 72 %
Platelets: 209 10*3/uL (ref 150–400)
RBC: 3.38 MIL/uL — ABNORMAL LOW (ref 3.87–5.11)
RDW: 14.9 % (ref 11.5–15.5)
WBC: 7.5 10*3/uL (ref 4.0–10.5)
nRBC: 0 % (ref 0.0–0.2)

## 2022-02-21 LAB — GLUCOSE, CAPILLARY
Glucose-Capillary: 130 mg/dL — ABNORMAL HIGH (ref 70–99)
Glucose-Capillary: 68 mg/dL — ABNORMAL LOW (ref 70–99)
Glucose-Capillary: 93 mg/dL (ref 70–99)
Glucose-Capillary: 146 mg/dL — ABNORMAL HIGH (ref 70–99)

## 2022-02-21 LAB — RENAL FUNCTION PANEL
Albumin: 2.5 g/dL — ABNORMAL LOW (ref 3.5–5.0)
Anion gap: 14 (ref 5–15)
BUN: 39 mg/dL — ABNORMAL HIGH (ref 8–23)
CO2: 25 mmol/L (ref 22–32)
Calcium: 9.1 mg/dL (ref 8.9–10.3)
Chloride: 97 mmol/L — ABNORMAL LOW (ref 98–111)
Creatinine, Ser: 6.82 mg/dL — ABNORMAL HIGH (ref 0.44–1.00)
GFR, Estimated: 6 mL/min — ABNORMAL LOW (ref >60)
Glucose, Bld: 206 mg/dL — ABNORMAL HIGH (ref 70–99)
Phosphorus: 6.3 mg/dL — ABNORMAL HIGH (ref 2.5–4.6)
Potassium: 3 mmol/L — ABNORMAL LOW (ref 3.5–5.1)
Sodium: 136 mmol/L (ref 135–145)

## 2022-02-21 LAB — HEPATITIS B SURFACE ANTIBODY, QUANTITATIVE: Hep B S AB Quant (Post): >1000 m[IU]/mL (ref >9.9)

## 2022-02-21 LAB — MAGNESIUM: Magnesium: 2 mg/dL (ref 1.7–2.4)

## 2022-02-21 MED ORDER — LIDOCAINE HCL (PF) 1 % IJ SOLN: 5.0000 mL | INTRAMUSCULAR | Status: DC | PRN

## 2022-02-21 MED ORDER — LIDOCAINE-PRILOCAINE 2.5-2.5 % EX CREA: 1.0000 | TOPICAL_CREAM | CUTANEOUS | Status: DC | PRN

## 2022-02-21 MED ORDER — PENTAFLUOROPROP-TETRAFLUOROETH EX AERO: 1.0000 | INHALATION_SPRAY | CUTANEOUS | Status: DC | PRN

## 2022-02-21 NOTE — Plan of Care (Signed)
  Problem: Education: Goal: Ability to describe self-care measures that may prevent or decrease complications (Diabetes Survival Skills Education) will improve Outcome: Not Progressing Goal: Individualized Educational Video(s) Outcome: Not Progressing   Problem: Coping: Goal: Ability to adjust to condition or change in health will improve Outcome: Progressing   Problem: Fluid Volume: Goal: Ability to maintain a balanced intake and output will improve Outcome: Progressing   Problem: Health Behavior/Discharge Planning: Goal: Ability to identify and utilize available resources and services will improve Outcome: Progressing Goal: Ability to manage health-related needs will improve Outcome: Progressing   Problem: Metabolic: Goal: Ability to maintain appropriate glucose levels will improve Outcome: Progressing   Problem: Nutritional: Goal: Maintenance of adequate nutrition will improve Outcome: Progressing Goal: Progress toward achieving an optimal weight will improve Outcome: Progressing   Problem: Skin Integrity: Goal: Risk for impaired skin integrity will decrease Outcome: Progressing   Problem: Tissue Perfusion: Goal: Adequacy of tissue perfusion will improve Outcome: Progressing   Problem: Education: Goal: Knowledge of General Education information will improve Description: Including pain rating scale, medication(s)/side effects and non-pharmacologic comfort measures Outcome: Not Progressing   Problem: Health Behavior/Discharge Planning: Goal: Ability to manage health-related needs will improve Outcome: Progressing   Problem: Clinical Measurements: Goal: Ability to maintain clinical measurements within normal limits will improve Outcome: Progressing Goal: Will remain free from infection Outcome: Progressing Goal: Diagnostic test results will improve Outcome: Progressing Goal: Respiratory complications will improve Outcome: Progressing Goal: Cardiovascular  complication will be avoided Outcome: Progressing   Problem: Activity: Goal: Risk for activity intolerance will decrease Outcome: Not Progressing   Problem: Nutrition: Goal: Adequate nutrition will be maintained Outcome: Progressing   Problem: Coping: Goal: Level of anxiety will decrease Outcome: Progressing   Problem: Pain Managment: Goal: General experience of comfort will improve Outcome: Progressing   Problem: Health Behavior/Discharge Planning: Goal: Ability to manage health-related needs will improve Outcome: Progressing   Problem: Education: Goal: Knowledge of General Education information will improve Description: Including pain rating scale, medication(s)/side effects and non-pharmacologic comfort measures Outcome: Progressing   Problem: Clinical Measurements: Goal: Ability to maintain clinical measurements within normal limits will improve Outcome: Progressing Goal: Will remain free from infection Outcome: Progressing Goal: Diagnostic test results will improve Outcome: Progressing Goal: Respiratory complications will improve Outcome: Progressing Goal: Cardiovascular complication will be avoided Outcome: Progressing   Problem: Nutrition: Goal: Adequate nutrition will be maintained Outcome: Progressing   Problem: Coping: Goal: Level of anxiety will decrease Outcome: Progressing

## 2022-02-21 NOTE — Progress Notes (Signed)
Received patient in bed to unit.  Alert and oriented.  Informed consent signed and in chart.   Treatment initiated 0927 Treatment completed: 1227  Patient tolerated well.  Transported back to the room  Alert, without acute distress.  Hand-off given to patient's nurse.   Access used: AVG Access issues: none  Total UF removed: 1000 ml Medication(s) given: Zofran 4mg . Midodrine 5 mg. Ferric gluconate 125 mg. Post HD VS: 115/40. P 66. R 20 Post HD weight: 56kg   [facility]

## 2022-02-21 NOTE — Progress Notes (Signed)
[facility] Progress Note   Subjective:   Pt seen on HD, sleeping. Awakens to voice but does not answer ROS questions. Also stopped by her room to talk to her family, they express no questions or concerns today.   Objective Vitals:   02/21/22 0400 02/21/22 0905 02/21/22 0927 02/21/22 1000  BP: (!) 94/42 (!) 140/42 (!) 125/35 (!) 121/37  Pulse: 64 76 63 (!) 50  Resp: 20  (!) 21 20  Temp: 97.8 F (36.6 C) 97.9 F (36.6 C)    TempSrc: Oral Oral    SpO2: 93% 93% 92% 93%  Weight:      Height:       Physical Exam General: Elderly female, sleeping, NAD Heart: RRR, no murmurs, rubs or gallops Lungs: CTA anteriorly without wheezing, rhonchi or rales Abdomen: Soft, non-distended, +BS Extremities:No edema b/l lower extremities  Dialysis Access: LUE AVG accessed  Additional Objective Labs: Basic Metabolic Panel: Recent Labs  Lab 02/19/22 1157 02/20/22 1045 02/21/22 0217  NA 138 136 136  K 3.6 3.1* 3.0*  CL 98 97* 97*  CO2 27 24 25   GLUCOSE 193* 123* 206*  BUN 27* 35* 39*  CREATININE 4.28* 5.93* 6.82*  CALCIUM 9.8 9.4 9.1  PHOS 5.3* 6.0* 6.3*   Liver Function Tests: Recent Labs  Lab 02/18/22 1405 02/19/22 1157 02/20/22 1045 02/21/22 0217  AST 46* 38  --   --   ALT 28 20  --   --   ALKPHOS 166* 126  --   --   BILITOT 0.8 0.9  --   --   PROT 7.1 6.1*  --   --   ALBUMIN 2.7* 2.3* 2.3* 2.5*   Recent Labs  Lab 02/18/22 1442  LIPASE 51   CBC: Recent Labs  Lab 02/18/22 1405 02/19/22 1157 02/20/22 1045 02/21/22 0217  WBC 11.0* 9.6 8.1 7.5  NEUTROABS  --   --  6.0 5.5  HGB 13.1 11.1* 10.9* 11.0*  HCT 39.5 33.8* 33.3* 33.8*  MCV 99.5 100.0 98.5 100.0  PLT 221 191 186 209   Blood Culture    Component Value Date/Time   SDES BLOOD RIGHT HAND 02/13/2018 0404   SPECREQUEST  02/13/2018 0404    BOTTLES DRAWN AEROBIC AND ANAEROBIC Blood Culture adequate volume   CULT  02/13/2018 0404    NO GROWTH 5 DAYS Performed at [facility], [address], [place], [place] [address]    REPTSTATUS 02/18/2018 FINAL 02/13/2018 0404    Cardiac Enzymes: No results for input(s): "CKTOTAL", "CKMB", "CKMBINDEX", "TROPONINI" in the last 168 hours. CBG: Recent Labs  Lab 02/20/22 0924 02/20/22 1510 02/20/22 1911 02/20/22 2100 02/21/22 0740  GLUCAP 124* 174* 277* 256* 130*   Iron Studies: No results for input(s): "IRON", "TIBC", "TRANSFERRIN", "FERRITIN" in the last 72 hours. @lablastinr3 @ Studies/Results: ECHOCARDIOGRAM COMPLETE  Result Date: 02/19/2022    ECHOCARDIOGRAM REPORT   Patient Name:   ARIN VANOSDOL Date of Exam: 02/19/2022 Medical Rec #:  [id number]              Height:       63.0 in Accession #:    [id number]             Weight:       132.3 lb Date of Birth:  07/19/1950              BSA:          1.622 m Patient Age:    34 years  BP:           103/34 mmHg Patient Gender: F                      HR:           79 bpm. Exam Location:  Inpatient Procedure: 2D Echo, Cardiac Doppler, Color Doppler and Intracardiac            Opacification Agent Indications:    Elevated troponin  History:        Patient has prior history of Echocardiogram examinations, most                 recent 02/13/2017. Risk Factors:Hypertension, Diabetes and                 Dyslipidemia.  Sonographer:    Clayton Lefort RDCS (AE) Referring Phys: [id number] [facility]  Sonographer Comments: Suboptimal apical window. IMPRESSIONS  1. Left ventricular ejection fraction, by estimation, is 55%. The left ventricle has normal function. The left ventricle has no regional wall motion abnormalities. Left ventricular diastolic parameters are consistent with Grade I diastolic dysfunction (impaired relaxation). Elevated left atrial pressure.  2. Right ventricular systolic function is normal. The right ventricular size is normal.  3. The mitral valve is abnormal. Mild to moderate mitral valve regurgitation. No evidence of mitral stenosis.  4. The aortic valve is tricuspid.  There is mild calcification of the aortic valve. There is mild thickening of the aortic valve. Aortic valve regurgitation is mild to moderate. No aortic stenosis is present.  5. The inferior vena cava is normal in size with greater than 50% respiratory variability, suggesting right atrial pressure of 3 mmHg. FINDINGS  Left Ventricle: Left ventricular ejection fraction, by estimation, is 55%. The left ventricle has normal function. The left ventricle has no regional wall motion abnormalities. Definity contrast agent was given IV to delineate the left ventricular endocardial borders. The left ventricular internal cavity size was normal in size. There is no left ventricular hypertrophy. Left ventricular diastolic parameters are consistent with Grade I diastolic dysfunction (impaired relaxation). Elevated left atrial pressure. Right Ventricle: The right ventricular size is normal. Right vetricular wall thickness was not well visualized. Right ventricular systolic function is normal. Left Atrium: Left atrial size was normal in size. Right Atrium: Right atrial size was normal in size. Pericardium: There is no evidence of pericardial effusion. Mitral Valve: The mitral valve is abnormal. There is mild thickening of the mitral valve leaflet(s). There is mild calcification of the mitral valve leaflet(s). Mild mitral annular calcification. Mild to moderate mitral valve regurgitation. No evidence of mitral valve stenosis. Tricuspid Valve: The tricuspid valve is normal in structure. Tricuspid valve regurgitation is not demonstrated. No evidence of tricuspid stenosis. Aortic Valve: The aortic valve is tricuspid. There is mild calcification of the aortic valve. There is mild thickening of the aortic valve. There is mild aortic valve annular calcification. Aortic valve regurgitation is mild to moderate. Aortic regurgitation PHT measures 521 msec. No aortic stenosis is present. Aortic valve mean gradient measures 6.0 mmHg. Aortic  valve peak gradient measures 9.9 mmHg. Aortic valve area, by VTI measures 1.50 cm. Pulmonic Valve: The pulmonic valve was not well visualized. Pulmonic valve regurgitation is not visualized. No evidence of pulmonic stenosis. Aorta: The aortic root is normal in size and structure. Venous: The inferior vena cava is normal in size with greater than 50% respiratory variability, suggesting right atrial pressure of 3 mmHg. IAS/Shunts: No atrial  level shunt detected by color flow Doppler.  LEFT VENTRICLE PLAX 2D LVIDd:         5.00 cm   Diastology LVIDs:         3.70 cm   LV e' medial:    5.00 cm/s LV PW:         1.00 cm   LV E/e' medial:  17.6 LV IVS:        0.90 cm   LV e' lateral:   4.57 cm/s LVOT diam:     1.90 cm   LV E/e' lateral: 19.3 LV SV:         46 LV SV Index:   28 LVOT Area:     2.84 cm  RIGHT VENTRICLE RV Basal diam:  3.20 cm RV S prime:     13.80 cm/s TAPSE (M-mode): 2.3 cm LEFT ATRIUM             Index        RIGHT ATRIUM           Index LA diam:        3.90 cm 2.40 cm/m   RA Area:     10.50 cm LA Vol (A2C):   48.7 ml 30.02 ml/m  RA Volume:   21.80 ml  13.44 ml/m LA Vol (A4C):   48.3 ml 29.78 ml/m LA Biplane Vol: 49.2 ml 30.33 ml/m  AORTIC VALVE AV Area (Vmax):    1.48 cm AV Area (Vmean):   1.49 cm AV Area (VTI):     1.50 cm AV Vmax:           157.00 cm/s AV Vmean:          109.000 cm/s AV VTI:            0.305 m AV Peak Grad:      9.9 mmHg AV Mean Grad:      6.0 mmHg LVOT Vmax:         81.80 cm/s LVOT Vmean:        57.200 cm/s LVOT VTI:          0.161 m LVOT/AV VTI ratio: 0.53 AI PHT:            521 msec  AORTA Ao Root diam: 3.00 cm Ao Asc diam:  3.10 cm MITRAL VALVE MV Area (PHT): 4.60 cm     SHUNTS MV Decel Time: 165 msec     Systemic VTI:  0.16 m MV E velocity: 88.20 cm/s   Systemic Diam: 1.90 cm MV A velocity: 106.00 cm/s MV E/A ratio:  0.83 Carlyle Dolly MD Electronically signed by Carlyle Dolly MD Signature Date/Time: 02/19/2022/5:29:54 PM    Final    Medications:  cefTRIAXone  (ROCEPHIN)  IV 2 g (02/20/22 2255)   ferric gluconate (FERRLECIT) IVPB     metronidazole 500 mg (02/20/22 2326)    (feeding supplement) PROSource Plus  30 mL Oral BID BM   Chlorhexidine Gluconate Cloth  6 each Topical Q0600   feeding supplement  1 Container Oral TID BM   heparin  5,000 Units Subcutaneous Q8H   insulin aspart  0-6 Units Subcutaneous TID WC   insulin glargine-yfgn  8 Units Subcutaneous QHS   midodrine  5 mg Oral TID WC   multivitamin  1 tablet Oral QHS    Dialysis Orders: [facility] 4h  400/500  52kg  2/2 baht  P2  Heparin 2000 LUA AVG - hep B labs here: pend - last HD 8/19, post  wt 53.3kg, last wk edw lowered 3kg - last Hb 11.8 on 8/15, tsat 26%, no esa - iron sucrose IV 100 q hd thru 8/26 - doxercalciferol 5 ug IV tiw  Assessment/Plan: Enterocolitis - w/ diarrhea , poor po intake for 2 weeks. Started on IV abx here, GI panel negative 2. AMS - some of this is chronic, possibly dementia. Not uremia given good HD compliance and low B/Cr. Family states has been off and on for 1-2 yrs. Gabapentin for this patient may be contributing, has been discontinued.  3. Dysphagia - per pmd, SLP following 4. ^Troponin - per cardiology > no CP or ischemic symptoms, follow trop trend. No further work up planned for now.  5. ESRD - on HD TTS. Has not missed dialysis. Continue TTS schedule 6. HTN/ vol - BP's low normal here, on room air. Euvolemic on exam. Follow.  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 Is getting IV Fe load at OP unit, will cont thru 8/26.  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 Phos slightly elevated. No binders ordered but is on liquid diet, will plan  to restart binders once back on regular diet   Anice Paganini, PA-C 02/21/2022, 10:04 AM  [facility] Pager: [phone]

## 2022-02-21 NOTE — Progress Notes (Signed)
PROGRESS NOTE    Sarah Kelley  XBM:[id number] DOB: Dec 06, 1950 DOA: 02/18/2022 PCP: [name], MD    Chief Complaint  Patient presents with   Failure To Thrive    Brief Narrative:  HPI per Dr. Johnathan Hausen Sarah Kelley is a 71 y.o. female with medical history significant of ESRD on TTS dialysis, last had dialysis earlier today.  HTN, HLD, DM2 on insulin.   Pt presents to ED with 2 week h/o reduced PO intake, diaphoresis over past 3-4 days and profuse diarrhea.   Per PT: feels like food gets stuck in throat.   No CP, SOB.   No sick contacts in family, no one else in family is sick.    Assessment & Plan:  Principal Problem:   Enterocolitis Active Problems:   Dysphagia   ESRD (end stage renal disease) (HCC)   Elevated troponin   Essential hypertension   Type 2 diabetes mellitus with chronic kidney disease on chronic dialysis, with long-term current use of insulin (HCC)   Hypothyroidism   Dementia without behavioral disturbance (HCC)   Hypokalemia   Malnutrition of moderate degree    Assessment and Plan: * Enterocolitis Presumed infectious initially on admission, with frequent diarrhea and decreased PO intake. GI pathogen pnl negative. C.Diff neg CT abdomen and pelvis concerning for colitis.  Continue empiric IV Rocephin and Flagyl, and treat empirically for total of 5 days. See ESRD below regarding fluid status GI following and appreciate their input and recommendations.  Dysphagia Probably the underlying cause of reduced PO intake, even more so than the GI illness. 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  We will likely get a barium esophagram tomorrow for further  evaluation. Patient seen by speech therapy currently on clear liquids which she seems to be tolerating.  Advance diet to a full liquid diet.  Elevated troponin Cards consulted on patient They indicate that this is likely demand ischemia Troponin is elevated but seem to be flattened.  Patient with doubt any chest pain. 2D echo from 02/13/2018 with EF of 55 to 02%,VOZD, grade 1 diastolic dysfunction. Repeat 2D echo with EF of 66%,YQIH, grade 1 diastolic dysfunction, normal right ventricular systolic function, mild to moderate MVR, no evidence of mitral stenosis. Patient seen in consultation by cardiology who reviewed 2D echo and EKG and will not pursue any further cardiac evaluation at this time, cardiology feels troponins flat not consistent with ACS and also feel patient is not an ideal candidate for invasive work-up.  ESRD (end stage renal disease) ([place]) Appears to be possibly volume overloaded with mod B pleural effusions on CT scan despite recent GI illness with diarrhea, poor PO intake, and dialysis on 02/18/2022 prior to presentation in the ED.Marland Kitchen Despite this no symptoms of volume overload, so B effusions may be very chronic. Patient noted to have received a 1 L bolus in the ED we will monitor blood pressure and if drops further we will give a 250 cc bolus of normal saline x1 due to ESRD.  BNP elevated at 1707.5.  Nephrology following and patient currently in HD.  Hypothyroidism Mildly low TSH today on labs Checking T4 to confirm Start synthroid if confirmed. Looks like "thyroid disease" is on her [place] list, but I dont see where shes on  any sort of thyroid hormone replacement. Likely needs outpatient follow-up.   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 -CBG 130 this morning.   -Continue CBGs to before meals and at bedtime.   -Continue Semglee 8 units daily, SSI.     Essential hypertension Soft BPs. -Continue to hold BP medications. -Patient started on midodrine.  Hypokalemia - Likely in part secondary to GI losses. -Defer repletion to nephrology as patient ESRD on HD.  Dementia without behavioral disturbance ([place]) - Stable. -Patient with a severe dementia, monitor for now. -         DVT prophylaxis: Heparin Code Status: Full Family Communication: Patient in hemodialysis.  Updated son on the telephone via [place] interpreter.   Disposition: Home when medically stable with clinical improvement hopefully in the next 3 to 4 days and after barium esophagram done.  Status is: Inpatient    Consultants:  Nephrology: Dr.Schertz 02/19/2022 Gastroenterology: Dr. Tarri Glenn 02/19/2022   Procedures:  Central line placement per EDP Dr.Dixon 02/19/2022 CT chest abdomen and pelvis 02/18/2022 Chest x-ray 02/18/2022  Antimicrobials:  IV Rocephin 02/18/2022>>>> IV Flagyl 02/18/2022>>>>   Subjective: In hemodialysis.  Nonverbal.    Objective: Vitals:   02/21/22 1200 02/21/22 1227 02/21/22 1300 02/21/22 1322  BP: (!) 115/40 (!) 110/40  94/74  Pulse: 66 62  66  Resp: 20 20  19   Temp: 98 F (36.7 C)   98.5 F (36.9 C)  TempSrc: Oral   Oral  SpO2: 97% 98%  94%  Weight:   56 kg   Height:        Intake/Output Summary (Last 24 hours) at 02/21/2022 1511 Last data filed at 02/21/2022 1227 Gross per 24 hour  Intake --  Output 1000 ml  Net -1000 ml   Filed Weights   02/20/22 0659 02/21/22 1300  Weight: 56.7 kg 56 kg    Examination:  General exam: In HD, sleeping. Respiratory system: CTA B anterior lung fields.  No wheezes, no crackles, no rhonchi.  Fair air movement. Cardiovascular system: RRR no murmurs rubs or gallops.  No JVD.  No lower extremity edema.  Gastrointestinal system: Abdomen is soft, nontender, nondistended, positive bowel sounds.  No rebound.  No guarding.   Central nervous system: Sleeping in HD.  Arousable.  Moving  extremities spontaneously.  No focal neurological deficits. Extremities: Symmetric 5 x 5 power. Skin: No rashes, lesions or ulcers Psychiatry: Judgement and insight unable to assess. Mood & affect appropriate.     Data Reviewed:   CBC: Recent Labs  Lab 02/18/22 1405 02/19/22 1157 02/20/22 1045 02/21/22 0217  WBC 11.0* 9.6 8.1 7.5  NEUTROABS  --   --  6.0 5.5  HGB 13.1 11.1* 10.9* 11.0*  HCT 39.5 33.8* 33.3* 33.8*  MCV 99.5 100.0 98.5 100.0  PLT 221 191 186 852    Basic Metabolic Panel: Recent Labs  Lab 02/18/22 1405 02/18/22 1453 02/19/22 1157 02/20/22 1045 02/21/22 0217  NA 136  --  138 136 136  K 3.5  --  3.6 3.1* 3.0*  CL 94*  --  98 97* 97*  CO2 29  --  27 24 25   GLUCOSE 275*  --  193* 123* 206*  BUN 21  --  27* 35* 39*  CREATININE 3.22*  --  4.28* 5.93* 6.82*  CALCIUM 9.4  --  9.8 9.4 9.1  MG  --  2.1  --  1.9 2.0  PHOS  --   --  5.3* 6.0* 6.3*    GFR: Estimated Creatinine Clearance: 5.9 mL/min (A) (by C-G formula based on SCr of 6.82 mg/dL (H)).  Liver Function Tests: Recent Labs  Lab 02/18/22 1405 02/19/22 1157 02/20/22 1045 02/21/22 0217  AST 46* 38  --   --   ALT 28 20  --   --   ALKPHOS 166* 126  --   --   BILITOT 0.8 0.9  --   --   PROT 7.1 6.1*  --   --   ALBUMIN 2.7* 2.3* 2.3* 2.5*    CBG: Recent Labs  Lab 02/20/22 1510 02/20/22 1911 02/20/22 2100 02/21/22 0740 02/21/22 1338  GLUCAP 174* 277* 256* 130* 68*     Recent Results (from the past 240 hour(s))  C Difficile Quick Screen w PCR reflex     Status: None   Collection Time: 02/18/22  5:41 PM   Specimen: STOOL  Result Value Ref Range Status   C Diff antigen NEGATIVE NEGATIVE Final   C Diff toxin NEGATIVE NEGATIVE Final   C Diff interpretation No C. difficile detected.  Final    Comment: Performed at [facility], [place] [address] [place], [place] [address]  Gastrointestinal Panel by PCR , Stool     Status: None   Collection Time: 02/18/22  5:42 PM   Specimen:  STOOL  Result Value Ref Range Status   Campylobacter species NOT DETECTED NOT DETECTED Final   Plesimonas shigelloides NOT DETECTED NOT DETECTED Final   Salmonella species NOT DETECTED NOT DETECTED Final   Yersinia enterocolitica NOT DETECTED NOT DETECTED Final   Vibrio species NOT DETECTED NOT DETECTED Final   Vibrio cholerae NOT DETECTED NOT DETECTED Final   Enteroaggregative E coli (EAEC) NOT DETECTED NOT DETECTED Final   Enteropathogenic E coli (EPEC) NOT DETECTED NOT DETECTED Final   Enterotoxigenic E coli (ETEC) NOT DETECTED NOT DETECTED Final   Shiga like toxin producing E coli (STEC) NOT DETECTED NOT DETECTED Final   Shigella/Enteroinvasive E coli (EIEC) NOT DETECTED NOT DETECTED Final   Cryptosporidium NOT DETECTED NOT DETECTED Final   Cyclospora cayetanensis NOT DETECTED NOT DETECTED Final   Entamoeba histolytica NOT DETECTED NOT DETECTED Final   Giardia lamblia NOT DETECTED NOT DETECTED Final   Adenovirus F40/41 NOT DETECTED NOT DETECTED Final   Astrovirus NOT DETECTED NOT DETECTED Final   Norovirus GI/GII NOT DETECTED NOT DETECTED Final   Rotavirus A NOT DETECTED NOT DETECTED Final   Sapovirus (I, II, IV, and V) NOT DETECTED NOT DETECTED Final    Comment: Performed at [facility], [address], [place], [place] [address]         Radiology Studies: ECHOCARDIOGRAM COMPLETE  Result Date: 02/19/2022    ECHOCARDIOGRAM REPORT   Patient Name:   VERNETTE MOISE Date of Exam: 02/19/2022 Medical Rec #:  [id number]              Height:       63.0 in Accession #:    [id number]             Weight:       132.3 lb Date of Birth:  Jan 25, 1951              BSA:          1.622 m Patient Age:    1 years               BP:  103/34 mmHg Patient Gender: F                      HR:           79 bpm. Exam Location:  Inpatient Procedure: 2D Echo, Cardiac Doppler, Color Doppler and Intracardiac            Opacification Agent Indications:    Elevated troponin  History:         Patient has prior history of Echocardiogram examinations, most                 recent 02/13/2017. Risk Factors:Hypertension, Diabetes and                 Dyslipidemia.  Sonographer:    Clayton Lefort RDCS (AE) Referring Phys: [id number] [facility]  Sonographer Comments: Suboptimal apical window. IMPRESSIONS  1. Left ventricular ejection fraction, by estimation, is 55%. The left ventricle has normal function. The left ventricle has no regional wall motion abnormalities. Left ventricular diastolic parameters are consistent with Grade I diastolic dysfunction (impaired relaxation). Elevated left atrial pressure.  2. Right ventricular systolic function is normal. The right ventricular size is normal.  3. The mitral valve is abnormal. Mild to moderate mitral valve regurgitation. No evidence of mitral stenosis.  4. The aortic valve is tricuspid. There is mild calcification of the aortic valve. There is mild thickening of the aortic valve. Aortic valve regurgitation is mild to moderate. No aortic stenosis is present.  5. The inferior vena cava is normal in size with greater than 50% respiratory variability, suggesting right atrial pressure of 3 mmHg. FINDINGS  Left Ventricle: Left ventricular ejection fraction, by estimation, is 55%. The left ventricle has normal function. The left ventricle has no regional wall motion abnormalities. Definity contrast agent was given IV to delineate the left ventricular endocardial borders. The left ventricular internal cavity size was normal in size. There is no left ventricular hypertrophy. Left ventricular diastolic parameters are consistent with Grade I diastolic dysfunction (impaired relaxation). Elevated left atrial pressure. Right Ventricle: The right ventricular size is normal. Right vetricular wall thickness was not well visualized. Right ventricular systolic function is normal. Left Atrium: Left atrial size was normal in size. Right Atrium: Right atrial size was normal in size.  Pericardium: There is no evidence of pericardial effusion. Mitral Valve: The mitral valve is abnormal. There is mild thickening of the mitral valve leaflet(s). There is mild calcification of the mitral valve leaflet(s). Mild mitral annular calcification. Mild to moderate mitral valve regurgitation. No evidence of mitral valve stenosis. Tricuspid Valve: The tricuspid valve is normal in structure. Tricuspid valve regurgitation is not demonstrated. No evidence of tricuspid stenosis. Aortic Valve: The aortic valve is tricuspid. There is mild calcification of the aortic valve. There is mild thickening of the aortic valve. There is mild aortic valve annular calcification. Aortic valve regurgitation is mild to moderate. Aortic regurgitation PHT measures 521 msec. No aortic stenosis is present. Aortic valve mean gradient measures 6.0 mmHg. Aortic valve peak gradient measures 9.9 mmHg. Aortic valve area, by VTI measures 1.50 cm. Pulmonic Valve: The pulmonic valve was not well visualized. Pulmonic valve regurgitation is not visualized. No evidence of pulmonic stenosis. Aorta: The aortic root is normal in size and structure. Venous: The inferior vena cava is normal in size with greater than 50% respiratory variability, suggesting right atrial pressure of 3 mmHg. IAS/Shunts: No atrial level shunt detected by color flow Doppler.  LEFT VENTRICLE PLAX  2D LVIDd:         5.00 cm   Diastology LVIDs:         3.70 cm   LV e' medial:    5.00 cm/s LV PW:         1.00 cm   LV E/e' medial:  17.6 LV IVS:        0.90 cm   LV e' lateral:   4.57 cm/s LVOT diam:     1.90 cm   LV E/e' lateral: 19.3 LV SV:         46 LV SV Index:   28 LVOT Area:     2.84 cm  RIGHT VENTRICLE RV Basal diam:  3.20 cm RV S prime:     13.80 cm/s TAPSE (M-mode): 2.3 cm LEFT ATRIUM             Index        RIGHT ATRIUM           Index LA diam:        3.90 cm 2.40 cm/m   RA Area:     10.50 cm LA Vol (A2C):   48.7 ml 30.02 ml/m  RA Volume:   21.80 ml  13.44 ml/m LA  Vol (A4C):   48.3 ml 29.78 ml/m LA Biplane Vol: 49.2 ml 30.33 ml/m  AORTIC VALVE AV Area (Vmax):    1.48 cm AV Area (Vmean):   1.49 cm AV Area (VTI):     1.50 cm AV Vmax:           157.00 cm/s AV Vmean:          109.000 cm/s AV VTI:            0.305 m AV Peak Grad:      9.9 mmHg AV Mean Grad:      6.0 mmHg LVOT Vmax:         81.80 cm/s LVOT Vmean:        57.200 cm/s LVOT VTI:          0.161 m LVOT/AV VTI ratio: 0.53 AI PHT:            521 msec  AORTA Ao Root diam: 3.00 cm Ao Asc diam:  3.10 cm MITRAL VALVE MV Area (PHT): 4.60 cm     SHUNTS MV Decel Time: 165 msec     Systemic VTI:  0.16 m MV E velocity: 88.20 cm/s   Systemic Diam: 1.90 cm MV A velocity: 106.00 cm/s MV E/A ratio:  0.83 Carlyle Dolly MD Electronically signed by Carlyle Dolly MD Signature Date/Time: 02/19/2022/5:29:54 PM    Final         Scheduled Meds:  (feeding supplement) PROSource Plus  30 mL Oral BID BM   Chlorhexidine Gluconate Cloth  6 each Topical Q0600   feeding supplement  1 Container Oral TID BM   heparin  5,000 Units Subcutaneous Q8H   insulin aspart  0-6 Units Subcutaneous TID WC   insulin glargine-yfgn  8 Units Subcutaneous QHS   midodrine  5 mg Oral TID WC   multivitamin  1 tablet Oral QHS   Continuous Infusions:  cefTRIAXone (ROCEPHIN)  IV 2 g (02/20/22 2255)   ferric gluconate (FERRLECIT) IVPB 125 mg (02/21/22 1122)   metronidazole 500 mg (02/21/22 1332)     LOS: 2 days    Time spent: 40 minutes    Irine Seal, MD [facility]   To contact the attending provider between 7A-7P or the covering  provider during after hours 7P-7A, please log into the web site [email or web address] and access using universal [place] password for that web site. If you do not have the password, please call the hospital operator.  02/21/2022, 3:11 PM

## 2022-02-22 ENCOUNTER — Inpatient Hospital Stay (HOSPITAL_COMMUNITY): Payer: Medicaid Other

## 2022-02-22 DIAGNOSIS — J90 Pleural effusion, not elsewhere classified: Secondary | ICD-10-CM

## 2022-02-22 DIAGNOSIS — R1310 Dysphagia, unspecified: Secondary | ICD-10-CM

## 2022-02-22 DIAGNOSIS — R197 Diarrhea, unspecified: Secondary | ICD-10-CM

## 2022-02-22 DIAGNOSIS — R933 Abnormal findings on diagnostic imaging of other parts of digestive tract: Secondary | ICD-10-CM

## 2022-02-22 LAB — RENAL FUNCTION PANEL
Albumin: 2.3 g/dL — ABNORMAL LOW (ref 3.5–5.0)
Anion gap: 10 (ref 5–15)
BUN: 18 mg/dL (ref 8–23)
CO2: 28 mmol/L (ref 22–32)
Calcium: 9.7 mg/dL (ref 8.9–10.3)
Chloride: 98 mmol/L (ref 98–111)
Creatinine, Ser: 4.64 mg/dL — ABNORMAL HIGH (ref 0.44–1.00)
GFR, Estimated: 9 mL/min — ABNORMAL LOW (ref >60)
Glucose, Bld: 88 mg/dL (ref 70–99)
Phosphorus: 5.3 mg/dL — ABNORMAL HIGH (ref 2.5–4.6)
Potassium: 3.2 mmol/L — ABNORMAL LOW (ref 3.5–5.1)
Sodium: 136 mmol/L (ref 135–145)

## 2022-02-22 LAB — TSH: TSH: 2.906 u[IU]/mL (ref 0.350–4.500)

## 2022-02-22 LAB — CBC WITH DIFFERENTIAL/PLATELET
Abs Immature Granulocytes: 0.04 10*3/uL (ref 0.00–0.07)
Basophils Absolute: 0.1 10*3/uL (ref 0.0–0.1)
Basophils Relative: 1 %
Eosinophils Absolute: 0.2 10*3/uL (ref 0.0–0.5)
Eosinophils Relative: 4 %
HCT: 33.6 % — ABNORMAL LOW (ref 36.0–46.0)
Hemoglobin: 11.1 g/dL — ABNORMAL LOW (ref 12.0–15.0)
Immature Granulocytes: 1 %
Lymphocytes Relative: 16 %
Lymphs Abs: 1 10*3/uL (ref 0.7–4.0)
MCH: 32.7 pg (ref 26.0–34.0)
MCHC: 33 g/dL (ref 30.0–36.0)
MCV: 99.1 fL (ref 80.0–100.0)
Monocytes Absolute: 0.6 10*3/uL (ref 0.1–1.0)
Monocytes Relative: 10 %
Neutro Abs: 4.2 10*3/uL (ref 1.7–7.7)
Neutrophils Relative %: 68 %
Platelets: 184 10*3/uL (ref 150–400)
RBC: 3.39 MIL/uL — ABNORMAL LOW (ref 3.87–5.11)
RDW: 15 % (ref 11.5–15.5)
WBC: 6.1 10*3/uL (ref 4.0–10.5)
nRBC: 0 % (ref 0.0–0.2)

## 2022-02-22 LAB — GLUCOSE, CAPILLARY
Glucose-Capillary: 98 mg/dL (ref 70–99)
Glucose-Capillary: 186 mg/dL — ABNORMAL HIGH (ref 70–99)
Glucose-Capillary: 228 mg/dL — ABNORMAL HIGH (ref 70–99)
Glucose-Capillary: 209 mg/dL — ABNORMAL HIGH (ref 70–99)

## 2022-02-22 LAB — T4, FREE: Free T4: 1.69 ng/dL — ABNORMAL HIGH (ref 0.61–1.12)

## 2022-02-22 MED ORDER — INSULIN GLARGINE-YFGN 100 UNIT/ML ~~LOC~~ SOLN
6.0000 [IU] | Freq: Every day | SUBCUTANEOUS | Status: DC
  Administered 2022-02-22 – 2022-02-23 (×2): 6 [IU] via SUBCUTANEOUS
  Filled 2022-02-22 (×3): qty 0.06

## 2022-02-22 MED ORDER — CHLORHEXIDINE GLUCONATE CLOTH 2 % EX PADS
6.0000 | MEDICATED_PAD | Freq: Every day | CUTANEOUS | Status: DC
  Administered 2022-02-23 – 2022-02-24 (×2): 6 via TOPICAL

## 2022-02-22 MED ORDER — FENTANYL CITRATE PF 50 MCG/ML IJ SOSY
25.0000 ug | PREFILLED_SYRINGE | Freq: Once | INTRAMUSCULAR | Status: AC
  Administered 2022-02-23: 25 ug via INTRAVENOUS
  Filled 2022-02-22: qty 1

## 2022-02-22 NOTE — Inpatient Diabetes Management (Signed)
Inpatient Diabetes Program Recommendations  AACE/ADA: New Consensus Statement on Inpatient Glycemic Control (2015)  Target Ranges:  Prepandial:   less than 140 mg/dL      Peak postprandial:   less than 180 mg/dL (1-2 hours)      Critically ill patients:  140 - 180 mg/dL   Lab Results  Component Value Date   GLUCAP 98 02/22/2022   HGBA1C 7.7 (H) 02/18/2022    Latest Reference Range & Units 02/21/22 07:40 02/21/22 13:38 02/21/22 17:57 02/21/22 20:38 02/22/22 07:31  Glucose-Capillary 70 - 99 mg/dL 130 (H) 68 (L) 93 146 (H) 98  (H): Data is abnormally high (L): Data is abnormally low   Inpatient Diabetes Program Recommendations:   Please consider: -Decrease Semglee to 6 units  Thank you, Bethena Roys E. Corryn Madewell, RN, MSN, CDE  Diabetes Coordinator Inpatient Glycemic Control Team Team Pager [phone] (8am-5pm) 02/22/2022 11:51 AM

## 2022-02-22 NOTE — Progress Notes (Signed)
PT Cancellation Note  Patient Details Name: Jaislyn Blinn MRN: [id number] DOB: [birth year]   Cancelled Treatment:    Reason Eval/Treat Not Completed: Other (comment) (See PT evaluation on Mon, 8/21.  Equipment recommendations made and pt is not appropriate for PT at this time.  Thanks.)   Alvira Philips 02/22/2022, 8:32 AM Bensen [place]don M,PT Acute Rehab Services [phone]

## 2022-02-22 NOTE — Progress Notes (Signed)
PROGRESS NOTE    Sarah Kelley  CZY:[id number] DOB: Jan 07, 1951 DOA: 02/18/2022 PCP: [name], MD  Chief Complaint  Patient presents with   Failure To Thrive    Brief Narrative:  HPI per Dr. Johnathan Hausen [name] 71 y.o. female with medical history significant of ESRD on TTS dialysis, last had dialysis earlier today.  HTN, HLD, DM2 on insulin.   Pt presents to ED with 2 week h/o reduced PO intake, diaphoresis over past 3-4 days and profuse diarrhea.   Per PT: feels like food gets stuck in throat.   No CP, SOB.   No sick contacts in family, no one else in family is sick.    Assessment & Plan:   Principal Problem:   Enterocolitis Active Problems:   Dysphagia   ESRD (end stage renal disease) (HCC)   Elevated troponin   Essential hypertension   Type 2 diabetes mellitus with chronic kidney disease on chronic dialysis, with long-term current use of insulin (HCC)   Hypothyroidism   Bilateral pleural effusion   Dementia without behavioral disturbance (HCC)   Hypokalemia   Malnutrition of moderate degree   Assessment and Plan: * Enterocolitis Presumed infectious initially on admission, with frequent diarrhea and decreased PO intake. GI pathogen pnl negative. C.Diff neg CT abdomen and pelvis concerning for colitis.  Continue empiric IV Rocephin and Flagyl, and treat empirically for total of 5 days. See ESRD below regarding fluid status GI following and appreciate their input and recommendations.  Dysphagia Probably the underlying cause of reduced PO intake, even more so than the GI illness. Fluid filled esophagus on CT noted  Esophagram limited due to patient's inability to follow commands, notable for age related esophageal dysmotility with poor primary contractions and abnormal disordered tertiary contractions with delayed passage of barium  SLP recommending thin liquids with diet advancement per GI  GI consulted, appreciate further  assistance  Elevated troponin Cards consulted on patient They indicate that this is likely demand ischemia Troponin is elevated but seem to be flattened.  Patient with doubt any chest pain. 2D echo from 02/13/2018 with EF of 55 to 09%,NATF, grade 1 diastolic dysfunction. Repeat 2D echo with EF of 57%,DUKG, grade 1 diastolic dysfunction, normal right ventricular systolic function, mild to moderate MVR, no evidence of mitral stenosis. Patient seen in consultation by cardiology who reviewed 2D echo and EKG and will not pursue any further cardiac evaluation at this time, cardiology feels troponins flat not consistent with ACS and also feel patient is not an ideal candidate for invasive work-up.  ESRD (end stage renal disease) ([place]) Appears to be possibly volume overloaded with mod B pleural effusions on CT scan despite recent GI illness with diarrhea, poor PO intake, and dialysis on 02/18/2022 prior to presentation in the ED. Nephrology following and patient currently in HD Volume management per renal   Hypothyroidism TSH wnl with elevated free T4 today, unclear significance Will need outpatient follow up of thyroid function tests Looks like "thyroid disease" is on her [place] list, but I dont see where shes on any sort of thyroid hormone replacement. Likely needs outpatient follow-up.   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 -Continue CBGs to before meals and at bedtime.   -Continue Semglee 6 units daily, SSI.    Essential hypertension Soft BPs. -Continue to hold BP medications. -Patient started on midodrine.  Bilateral pleural effusion Repeat  CXR 8/24  Hypokalemia - Likely in part secondary to GI losses. -Defer repletion to nephrology as patient ESRD on HD.  Dementia without behavioral disturbance ([place]) - Stable. -Patient with Sarah Kelley severe dementia, monitor for  now.          DVT prophylaxis: heparin Code Status: full Family Communication: son at bedsdie Disposition:   Status is: Inpatient Remains inpatient appropriate because: need for continued management   Consultants:  Renal GI  Procedures:  none  Antimicrobials:  Anti-infectives (From admission, onward)    Start     Dose/Rate Route Frequency Ordered Stop   02/18/22 2230  cefTRIAXone (ROCEPHIN) 2 g in sodium chloride 0.9 % 100 mL IVPB        2 g 200 mL/hr over 30 Minutes Intravenous Every 24 hours 02/18/22 2217     02/18/22 2230  metroNIDAZOLE (FLAGYL) IVPB 500 mg        500 mg 100 mL/hr over 60 Minutes Intravenous Every 12 hours 02/18/22 2217         Subjective: Unintelligible speech Discussed plan with son via interpreter  Objective: Vitals:   02/21/22 1900 02/22/22 0425 02/22/22 0734 02/22/22 1000  BP: (!) 116/43 (!) 127/42    Pulse: 75 72  68  Resp: 20 17  15   Temp: 98.5 F (36.9 C) 98.2 F (36.8 C) 98.3 F (36.8 C)   TempSrc: Oral     SpO2: 95% 92%  92%  Weight:      Height:        Intake/Output Summary (Last 24 hours) at 02/22/2022 1606 Last data filed at 02/22/2022 9211 Gross per 24 hour  Intake 710 ml  Output --  Net 710 ml   Filed Weights   02/20/22 0659 02/21/22 1300  Weight: 56.7 kg 56 kg    Examination:  General exam: Appears calm and comfortable  Respiratory system: unlabored Cardiovascular system: RRR Gastrointestinal system: Abdomen is nondistended, soft and nontender Central nervous system: awake, disoriented, unintelligible speech, moving all extremities Extremities: no LEE    Data Reviewed: I have personally reviewed following labs and imaging studies  CBC: Recent Labs  Lab 02/18/22 1405 02/19/22 1157 02/20/22 1045 02/21/22 0217 02/22/22 0228  WBC 11.0* 9.6 8.1 7.5 6.1  NEUTROABS  --   --  6.0 5.5 4.2  HGB 13.1 11.1* 10.9* 11.0* 11.1*  HCT 39.5 33.8* 33.3* 33.8* 33.6*  MCV 99.5 100.0 98.5 100.0 99.1  PLT 221  191 186 209 941    Basic Metabolic Panel: Recent Labs  Lab 02/18/22 1405 02/18/22 1453 02/19/22 1157 02/20/22 1045 02/21/22 0217 02/22/22 0228  NA 136  --  138 136 136 136  K 3.5  --  3.6 3.1* 3.0* 3.2*  CL 94*  --  98 97* 97* 98  CO2 29  --  27 24 25 28   GLUCOSE 275*  --  193* 123* 206* 88  BUN 21  --  27* 35* 39* 18  CREATININE 3.22*  --  4.28* 5.93* 6.82* 4.64*  CALCIUM 9.4  --  9.8 9.4 9.1 9.7  MG  --  2.1  --  1.9 2.0  --   PHOS  --   --  5.3* 6.0* 6.3* 5.3*    GFR: Estimated Creatinine Clearance: 8.6 mL/min (Sarah Kelley) (by C-G formula based on SCr of 4.64 mg/dL (H)).  Liver Function Tests: Recent Labs  Lab 02/18/22 1405 02/19/22 1157 02/20/22 1045 02/21/22 0217 02/22/22 0228  AST 46* 38  --   --   --  ALT 28 20  --   --   --   ALKPHOS 166* 126  --   --   --   BILITOT 0.8 0.9  --   --   --   PROT 7.1 6.1*  --   --   --   ALBUMIN 2.7* 2.3* 2.3* 2.5* 2.3*    CBG: Recent Labs  Lab 02/21/22 1338 02/21/22 1757 02/21/22 2038 02/22/22 0731 02/22/22 1159  GLUCAP 68* 93 146* 98 186*     Recent Results (from the past 240 hour(s))  C Difficile Quick Screen w PCR reflex     Status: None   Collection Time: 02/18/22  5:41 PM   Specimen: STOOL  Result Value Ref Range Status   C Diff antigen NEGATIVE NEGATIVE Final   C Diff toxin NEGATIVE NEGATIVE Final   C Diff interpretation No C. difficile detected.  Final    Comment: Performed at [facility], [place] [address], [place], [place] [address]  Gastrointestinal Panel by PCR , Stool     Status: None   Collection Time: 02/18/22  5:42 PM   Specimen: STOOL  Result Value Ref Range Status   Campylobacter species NOT DETECTED NOT DETECTED Final   Plesimonas shigelloides NOT DETECTED NOT DETECTED Final   Salmonella species NOT DETECTED NOT DETECTED Final   Yersinia enterocolitica NOT DETECTED NOT DETECTED Final   Vibrio species NOT DETECTED NOT DETECTED Final   Vibrio cholerae NOT DETECTED NOT DETECTED Final    Enteroaggregative E coli (EAEC) NOT DETECTED NOT DETECTED Final   Enteropathogenic E coli (EPEC) NOT DETECTED NOT DETECTED Final   Enterotoxigenic E coli (ETEC) NOT DETECTED NOT DETECTED Final   Shiga like toxin producing E coli (STEC) NOT DETECTED NOT DETECTED Final   Shigella/Enteroinvasive E coli (EIEC) NOT DETECTED NOT DETECTED Final   Cryptosporidium NOT DETECTED NOT DETECTED Final   Cyclospora cayetanensis NOT DETECTED NOT DETECTED Final   Entamoeba histolytica NOT DETECTED NOT DETECTED Final   Giardia lamblia NOT DETECTED NOT DETECTED Final   Adenovirus F40/41 NOT DETECTED NOT DETECTED Final   Astrovirus NOT DETECTED NOT DETECTED Final   Norovirus GI/GII NOT DETECTED NOT DETECTED Final   Rotavirus Sarah Kelley NOT DETECTED NOT DETECTED Final   Sapovirus (I, II, IV, and V) NOT DETECTED NOT DETECTED Final    Comment: Performed at [facility], [address] [place], [place] [address]         Radiology Studies: DG ESOPHAGUS W SINGLE CM (SOL OR THIN BA)  Result Date: 02/22/2022 CLINICAL DATA:  Patient presented to ED with 2 week history of reduced p.o. intake. Patient referred for single contrast medium esophagram due to fluid-filled esophagus noted on previous CT. EXAM: ESOPHAGUS/BARIUM SWALLOW/TABLET STUDY TECHNIQUE: Single contrast examination was performed using thin liquid barium. This exam was performed by Narda Rutherford, NP, and was supervised and interpreted by Nelson Chimes. MD. FLUOROSCOPY: Radiation Exposure Index (as provided by the fluoroscopic device): 30.20 mGy Kerma COMPARISON:  None Available. FINDINGS: Limited examination due to the patient's inability to stand or reposition on fluoroscopy table and unable to follow commands. Swallowing: Appears normal. No vestibular penetration or aspiration seen. Pharynx: Unremarkable. Esophagus: No esophageal mass or stricture. Esophageal motility: Age-related esophageal dysmotility with decreased primary stripping wave and increased  tertiary contractions. Hiatal Hernia: None. Gastroesophageal reflux: None visualized. Ingested 60mm barium tablet: Not given Other: None. IMPRESSION: Limited examination due to patient's inability to follow commands. Age-related esophageal dysmotility with poor primary contractions an abnormal  disordered tertiary contractions with delayed passage of barium. No mass or stricture. Read by: Narda Rutherford, AGNP-BC Electronically Signed   By: Nelson Chimes M.D.   On: 02/22/2022 13:41        Scheduled Meds:  (feeding supplement) PROSource Plus  30 mL Oral BID BM   [START ON 02/23/2022] Chlorhexidine Gluconate Cloth  6 each Topical Q0600   feeding supplement  1 Container Oral TID BM   heparin  5,000 Units Subcutaneous Q8H   insulin aspart  0-6 Units Subcutaneous TID WC   insulin glargine-yfgn  6 Units Subcutaneous QHS   midodrine  5 mg Oral TID WC   multivitamin  1 tablet Oral QHS   Continuous Infusions:  cefTRIAXone (ROCEPHIN)  IV Stopped (02/21/22 2208)   ferric gluconate (FERRLECIT) IVPB Stopped (02/21/22 1222)   metronidazole 500 mg (02/22/22 1018)     LOS: 3 days    Time spent: over 30 min    Fayrene Helper, MD [facility]   To contact the attending provider between 7A-7P or the covering provider during after hours 7P-7A, please log into the web site [email or web address] and access using universal [place] password for that web site. If you do not have the password, please call the hospital operator.  02/22/2022, 4:06 PM

## 2022-02-22 NOTE — Plan of Care (Signed)
  Problem: Education: Goal: Ability to describe self-care measures that may prevent or decrease complications (Diabetes Survival Skills Education) will improve Outcome: Progressing Note: Family verbalized understanding    Problem: Coping: Goal: Ability to adjust to condition or change in health will improve Outcome: Progressing

## 2022-02-22 NOTE — Progress Notes (Signed)
OT Cancellation Note  Patient Details Name: Luan Urbani MRN: [id number] DOB: [birth year]   Cancelled Treatment:    Reason Eval/Treat Not Completed: OT screened, no needs identified, will sign off New OT orders received. No new change in status noted and pt receives assist for all ADLs/transfers at baseline. OT evaluated and discharged on 8/21. No further OT needs indicated.   Layla Maw 02/22/2022, 8:31 AM

## 2022-02-22 NOTE — Progress Notes (Addendum)
HOSPITAL MEDICINE OVERNIGHT EVENT NOTE    Nursing reports that patient is complaining of abdominal pain.  Nursing reports abdomen is nondistended and soft with positive bowel sounds.  Family reports that patient has not had a bowel movement in "a while."   Considering patient's history of dementia difficult to ascertain whether this is truly abdominal pain or simply agitation.  Abdominal exam is reassuring.  We will obtain abdominal x-ray to evaluate for any evidence of ileus, obstruction or perforation which is doubtful.  Patient is ESRD and therefore we will not obtain a urinalysis.  Nursing is giving a dose of Tylenol, will observe patient's response to this.  NSAIDs will not be given in the setting of ESRD.    Vernelle Emerald  MD [facility]   ADDENDUM (8/23 11:50PM)  Notified by nursing that patient has not received any relief from the Tylenol.  Abdominal x-ray reviewed with no evidence of perforation or obstruction.  We will try a one-time modest dose of fentanyl 25 mcg.  Using this as opposed to other opiates due to its short half-life.  Continue to monitor closely.  Sherryll Burger Amity Roes

## 2022-02-22 NOTE — Progress Notes (Signed)
[facility] Progress Note   Subjective:   Pt seen in room. Not answering questions for me or her family, but does report abdominal pain on palpation.   Objective Vitals:   02/21/22 1900 02/22/22 0425 02/22/22 0734 02/22/22 1000  BP: (!) 116/43 (!) 127/42    Pulse: 75 72  68  Resp: 20 17  15   Temp: 98.5 F (36.9 C) 98.2 F (36.8 C) 98.3 F (36.8 C)   TempSrc: Oral     SpO2: 95% 92%  92%  Weight:      Height:       Physical Exam General: Elderly female, alert and in NAD Heart: RRR, no murmurs, rubs or gallops Lungs: CTA bilaterally without wheezing, rhonchi or rales Abdomen: Soft, non-distended, +TTP b/l lower quadrants Extremities: No edema b/l lower extremities Dialysis Access: LUE AVG + bruit  Additional Objective Labs: Basic Metabolic Panel: Recent Labs  Lab 02/20/22 1045 02/21/22 0217 02/22/22 0228  NA 136 136 136  K 3.1* 3.0* 3.2*  CL 97* 97* 98  CO2 24 25 28   GLUCOSE 123* 206* 88  BUN 35* 39* 18  CREATININE 5.93* 6.82* 4.64*  CALCIUM 9.4 9.1 9.7  PHOS 6.0* 6.3* 5.3*   Liver Function Tests: Recent Labs  Lab 02/18/22 1405 02/19/22 1157 02/20/22 1045 02/21/22 0217 02/22/22 0228  AST 46* 38  --   --   --   ALT 28 20  --   --   --   ALKPHOS 166* 126  --   --   --   BILITOT 0.8 0.9  --   --   --   PROT 7.1 6.1*  --   --   --   ALBUMIN 2.7* 2.3* 2.3* 2.5* 2.3*   Recent Labs  Lab 02/18/22 1442  LIPASE 51   CBC: Recent Labs  Lab 02/18/22 1405 02/19/22 1157 02/20/22 1045 02/21/22 0217 02/22/22 0228  WBC 11.0* 9.6 8.1 7.5 6.1  NEUTROABS  --   --  6.0 5.5 4.2  HGB 13.1 11.1* 10.9* 11.0* 11.1*  HCT 39.5 33.8* 33.3* 33.8* 33.6*  MCV 99.5 100.0 98.5 100.0 99.1  PLT 221 191 186 209 184   Blood Culture    Component Value Date/Time   SDES BLOOD RIGHT HAND 02/13/2018 0404   SPECREQUEST  02/13/2018 0404    BOTTLES DRAWN AEROBIC AND ANAEROBIC Blood Culture adequate volume   CULT  02/13/2018 0404    NO GROWTH 5 DAYS Performed at  [facility], [address] [place], [place] [address]    REPTSTATUS 02/18/2018 FINAL 02/13/2018 0404    Cardiac Enzymes: No results for input(s): "CKTOTAL", "CKMB", "CKMBINDEX", "TROPONINI" in the last 168 hours. CBG: Recent Labs  Lab 02/21/22 0740 02/21/22 1338 02/21/22 1757 02/21/22 2038 02/22/22 0731  GLUCAP 130* 68* 93 146* 98   Iron Studies: No results for input(s): "IRON", "TIBC", "TRANSFERRIN", "FERRITIN" in the last 72 hours. @lablastinr3 @ Studies/Results: No results found. Medications:  cefTRIAXone (ROCEPHIN)  IV Stopped (02/21/22 2208)   ferric gluconate (FERRLECIT) IVPB Stopped (02/21/22 1222)   metronidazole 500 mg (02/22/22 1018)    (feeding supplement) PROSource Plus  30 mL Oral BID BM   Chlorhexidine Gluconate Cloth  6 each Topical Q0600   feeding supplement  1 Container Oral TID BM   heparin  5,000 Units Subcutaneous Q8H   insulin aspart  0-6 Units Subcutaneous TID WC   insulin glargine-yfgn  6 Units Subcutaneous QHS   midodrine  5 mg Oral TID  WC   multivitamin  1 tablet Oral QHS    Dialysis Orders: [facility] 4h  400/500  52kg  2/2 baht  P2  Heparin 2000 LUA AVG - hep B labs here: pend - last HD 8/19, post wt 53.3kg, last wk edw lowered 3kg - last Hb 11.8 on 8/15, tsat 26%, no esa - iron sucrose IV 100 q hd thru 8/26 - doxercalciferol 5 ug IV tiw    Assessment/Plan: Enterocolitis - w/ diarrhea , poor po intake for 2 weeks. Started on IV abx here, GI panel negative 2. AMS - some of this is chronic, possibly dementia. Not uremia given good HD compliance and low B/Cr. Family states has been off and on for 1-2 yrs. Gabapentin for this patient may be contributing, has been discontinued.  3. Dysphagia - per pmd, SLP following 4. ^Troponin - per cardiology > no CP or ischemic symptoms, follow trop trend. No further work up planned for now.  5. ESRD - on HD TTS. Has not missed dialysis. Continue TTS schedule 6. HTN/ vol - BP's low normal here, on  room air. Euvolemic on exam. Follow.  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 Is getting IV Fe load at OP unit, will cont thru 8/26.  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 Phos slightly elevated. No binders ordered but is on liquid diet, will plan  to restart binders once back on regular diet 9. Hypokalemia: K+ 3.2. Likely due to decreased PO intake. 4K bath now available, will use with HD tomorrow.     Anice Paganini, PA-C 02/22/2022, 12:00 PM  [facility] Pager: [phone]

## 2022-02-22 NOTE — Progress Notes (Addendum)
Progress Note   Subjective  Chief Complaint: Diarrhea and dysphagia  Patient is seen with her son by her side who does translate.  She is really unable to answer any questioning given her dementia.  I discussed with the nurse that we would like to do a barium esophagram, she feels that the patient would likely be able to do this if she is given a little Zofran beforehand to help with nausea.  We will try and get this done today.  It sounds like diarrhea has slowed with the addition of Imodium yesterday.  She only had 2 loose bowel movements.   Objective   Vital signs in last 24 hours: Temp:  [98 F (36.7 C)-98.9 F (37.2 C)] 98.3 F (36.8 C) (08/23 0734) Pulse Rate:  [62-75] 68 (08/23 1000) Resp:  [15-27] 15 (08/23 1000) BP: (94-127)/(40-74) 127/42 (08/23 0425) SpO2:  [92 %-98 %] 92 % (08/23 1000) Weight:  [56 kg] 56 kg (08/22 1300) Last BM Date : 02/21/22 General:  Elderly Hispanic female in NAD Heart:  Regular rate and rhythm; no murmurs Lungs: Respirations even and unlabored, lungs CTA bilaterally Abdomen:  Soft, mild RLQ ttp and nondistended. Normal bowel sounds. Psych:  Cooperative. Normal mood and affect.  Intake/Output from previous day: 08/22 0701 - 08/23 0700 In: 910 [P.O.:200; IV Piggyback:710] Out: 1000    Lab Results: Recent Labs    02/20/22 1045 02/21/22 0217 02/22/22 0228  WBC 8.1 7.5 6.1  HGB 10.9* 11.0* 11.1*  HCT 33.3* 33.8* 33.6*  PLT 186 209 184   BMET Recent Labs    02/20/22 1045 02/21/22 0217 02/22/22 0228  NA 136 136 136  K 3.1* 3.0* 3.2*  CL 97* 97* 98  CO2 24 25 28   GLUCOSE 123* 206* 88  BUN 35* 39* 18  CREATININE 5.93* 6.82* 4.64*  CALCIUM 9.4 9.1 9.7   LFT Recent Labs    02/19/22 1157 02/20/22 1045 02/22/22 0228  PROT 6.1*  --   --   ALBUMIN 2.3*   < > 2.3*  AST 38  --   --   ALT 20  --   --   ALKPHOS 126  --   --   BILITOT 0.9  --   --    < > = values in this interval not displayed.     Assessment / Plan:    Assessment: 1.  Diarrhea: With CT showing mild small bowel thickening and focal pericolonic edema suspected due to volume overload, stool studies negative, patient started on empiric antibiotics, things seem to be slowing with the addition of Imodium 2.  Dysphagia: Patulous esophagus on CT, likely chronic, SLP evaluation grossly within normal limits, planning for esophagram today  Plan: 1.  Continue Imodium as needed for diarrhea 2.  Could consider discontinuing the antibiotics 3.  We will go ahead and order a barium esophagram today and will alert the nurses to give her dose of Zofran prior to heading down for that.  Thank you for your kind consultation, we will continue to follow along.    LOS: 3 days   Levin Erp  02/22/2022, 11:15 AM    Attending Physician Note   I have taken an interval history, reviewed the chart and examined the patient. I performed a substantive portion of this encounter, including complete performance of at least one of the key components, in conjunction with the APP. I agree with the APP's note, impression and recommendations with my edits. My additional impressions  and recommendations are as follows.   *Diarrhea, etiology unclear, however improving  *Dysphagia. Patulous esophagus on CT likely esophageal dysmotility. Barium esophagram today  Lucio Edward, MD [facility] See AMION, [facility], for our on call provider

## 2022-02-22 NOTE — Assessment & Plan Note (Addendum)
Repeat CXR 8/24 with mild bilateral effusion, increased mild central pulm vascular congestion CT with moderate R and small L effusions on 8/27 -> suspected initially due to volume, though renal doesn't think she's overloaded Discussed with family, I don't think thoracentesis for definitive diagnosis likely to meaningfully change outcome Volume per renal

## 2022-02-22 NOTE — Progress Notes (Signed)
[facility] paged that patient is having difficulty swallowing crushed medication in apple sauce.Patient is screaming not able to be consoled by family

## 2022-02-23 ENCOUNTER — Inpatient Hospital Stay (HOSPITAL_COMMUNITY): Payer: Medicaid Other

## 2022-02-23 DIAGNOSIS — Z515 Encounter for palliative care: Secondary | ICD-10-CM

## 2022-02-23 LAB — CBC WITH DIFFERENTIAL/PLATELET
Abs Immature Granulocytes: 0.06 10*3/uL (ref 0.00–0.07)
Basophils Absolute: 0.1 10*3/uL (ref 0.0–0.1)
Basophils Relative: 1 %
Eosinophils Absolute: 0.2 10*3/uL (ref 0.0–0.5)
Eosinophils Relative: 4 %
HCT: 38.3 % (ref 36.0–46.0)
Hemoglobin: 12.5 g/dL (ref 12.0–15.0)
Immature Granulocytes: 1 %
Lymphocytes Relative: 17 %
Lymphs Abs: 0.9 10*3/uL (ref 0.7–4.0)
MCH: 32.8 pg (ref 26.0–34.0)
MCHC: 32.6 g/dL (ref 30.0–36.0)
MCV: 100.5 fL — ABNORMAL HIGH (ref 80.0–100.0)
Monocytes Absolute: 0.6 10*3/uL (ref 0.1–1.0)
Monocytes Relative: 12 %
Neutro Abs: 3.3 10*3/uL (ref 1.7–7.7)
Neutrophils Relative %: 65 %
Platelets: 195 10*3/uL (ref 150–400)
RBC: 3.81 MIL/uL — ABNORMAL LOW (ref 3.87–5.11)
RDW: 15.2 % (ref 11.5–15.5)
WBC: 5.2 10*3/uL (ref 4.0–10.5)
nRBC: 0 % (ref 0.0–0.2)

## 2022-02-23 LAB — COMPREHENSIVE METABOLIC PANEL
ALT: 16 U/L (ref 0–44)
AST: 35 U/L (ref 15–41)
Albumin: 2.5 g/dL — ABNORMAL LOW (ref 3.5–5.0)
Alkaline Phosphatase: 110 U/L (ref 38–126)
Anion gap: 16 — ABNORMAL HIGH (ref 5–15)
BUN: 28 mg/dL — ABNORMAL HIGH (ref 8–23)
CO2: 26 mmol/L (ref 22–32)
Calcium: 9.8 mg/dL (ref 8.9–10.3)
Chloride: 96 mmol/L — ABNORMAL LOW (ref 98–111)
Creatinine, Ser: 6.76 mg/dL — ABNORMAL HIGH (ref 0.44–1.00)
GFR, Estimated: 6 mL/min — ABNORMAL LOW (ref >60)
Glucose, Bld: 155 mg/dL — ABNORMAL HIGH (ref 70–99)
Potassium: 3.3 mmol/L — ABNORMAL LOW (ref 3.5–5.1)
Sodium: 138 mmol/L (ref 135–145)
Total Bilirubin: 0.7 mg/dL (ref 0.3–1.2)
Total Protein: 6.7 g/dL (ref 6.5–8.1)

## 2022-02-23 LAB — GLUCOSE, CAPILLARY
Glucose-Capillary: 141 mg/dL — ABNORMAL HIGH (ref 70–99)
Glucose-Capillary: 163 mg/dL — ABNORMAL HIGH (ref 70–99)
Glucose-Capillary: 106 mg/dL — ABNORMAL HIGH (ref 70–99)

## 2022-02-23 LAB — PHOSPHORUS: Phosphorus: 6.7 mg/dL — ABNORMAL HIGH (ref 2.5–4.6)

## 2022-02-23 LAB — MAGNESIUM: Magnesium: 1.8 mg/dL (ref 1.7–2.4)

## 2022-02-23 MED ORDER — ANTICOAGULANT SODIUM CITRATE 4% (200MG/5ML) IV SOLN
5.0000 mL | Status: DC | PRN
Start: 2022-02-23 — End: 2022-02-23

## 2022-02-23 MED ORDER — PENTAFLUOROPROP-TETRAFLUOROETH EX AERO
1.0000 | INHALATION_SPRAY | CUTANEOUS | Status: DC | PRN
Start: 2022-02-23 — End: 2022-02-23

## 2022-02-23 MED ORDER — HEPARIN SODIUM (PORCINE) 1000 UNIT/ML DIALYSIS: 1000.0000 [IU] | INTRAMUSCULAR | Status: DC | PRN

## 2022-02-23 MED ORDER — POLYVINYL ALCOHOL 1.4 % OP SOLN
1.0000 [drp] | OPHTHALMIC | Status: DC | PRN
  Administered 2022-02-23 – 2022-03-06 (×2): 1 [drp] via OPHTHALMIC
  Filled 2022-02-23: qty 15

## 2022-02-23 MED ORDER — FENTANYL CITRATE PF 50 MCG/ML IJ SOSY: 25.0000 ug | PREFILLED_SYRINGE | Freq: Once | INTRAMUSCULAR | Status: DC | PRN

## 2022-02-23 MED ORDER — POLYETHYLENE GLYCOL 3350 17 G PO PACK
17.0000 g | PACK | Freq: Every day | ORAL | Status: DC
  Administered 2022-02-23 – 2022-02-27 (×2): 17 g via ORAL
  Filled 2022-02-23 (×3): qty 1

## 2022-02-23 MED ORDER — LIDOCAINE-PRILOCAINE 2.5-2.5 % EX CREA
1.0000 | TOPICAL_CREAM | CUTANEOUS | Status: DC | PRN
Start: 2022-02-23 — End: 2022-02-23

## 2022-02-23 MED ORDER — LIDOCAINE HCL (PF) 1 % IJ SOLN: 5.0000 mL | INTRAMUSCULAR | Status: DC | PRN

## 2022-02-23 MED ORDER — ALTEPLASE 2 MG IJ SOLR: 2.0000 mg | Freq: Once | INTRAMUSCULAR | Status: DC | PRN

## 2022-02-23 NOTE — Progress Notes (Signed)
HOSPITAL MEDICINE OVERNIGHT EVENT NOTE    Patient continuing to complain of generalized abdominal pain.  Patient is refusing to take as needed Tylenol for symptoms.  Patient exhibited a similar presentation yesterday evening with Tylenol not helping her symptoms.  Abdominal x-ray at that time was unremarkable.  We will order a one-time dose of 25 mcg of fentanyl as needed for severe pain if conservative measures are not successful in relieving her discomfort.  Furthermore, patient's blood sugar this evening is 106 with nursing asking as to whether Semglee should be given setting patient's tenuous oral intake.  I advised to proceed with administering the medication and to check a 2 AM blood sugar to evaluate for any evidence of developing hypoglycemia.  Sarah Emerald  MD [facility]

## 2022-02-23 NOTE — Progress Notes (Signed)
[facility] Progress Note   Subjective:   Unable to elicit history from pt due to dementia. Seems more agitated today. Her son reports she has been having back pain.   Objective Vitals:   02/22/22 1000 02/22/22 1700 02/22/22 2247 02/23/22 0900  BP:  (!) 120/39 (!) 123/36 96/74  Pulse: 68  71   Resp: 15 17 17 20   Temp:  99 F (37.2 C) 98.9 F (37.2 C) 98.8 F (37.1 C)  TempSrc:  Oral Oral Oral  SpO2: 92%  97% 96%  Weight:      Height:       Physical Exam General: Elderly female, alert, restless Heart: RRR, no murmurs, rubs or gallops Lungs: CTA bilaterally without wheezing, rhonchi or rales Abdomen: Soft, +BS Extremities: No edema b/l lower extremities Dialysis Access:  LUE AVG + bruit  Additional Objective Labs: Basic Metabolic Panel: Recent Labs  Lab 02/21/22 0217 02/22/22 0228 02/23/22 0705  NA 136 136 138  K 3.0* 3.2* 3.3*  CL 97* 98 96*  CO2 25 28 26   GLUCOSE 206* 88 155*  BUN 39* 18 28*  CREATININE 6.82* 4.64* 6.76*  CALCIUM 9.1 9.7 9.8  PHOS 6.3* 5.3* 6.7*   Liver Function Tests: Recent Labs  Lab 02/18/22 1405 02/19/22 1157 02/20/22 1045 02/21/22 0217 02/22/22 0228 02/23/22 0705  AST 46* 38  --   --   --  35  ALT 28 20  --   --   --  16  ALKPHOS 166* 126  --   --   --  110  BILITOT 0.8 0.9  --   --   --  0.7  PROT 7.1 6.1*  --   --   --  6.7  ALBUMIN 2.7* 2.3*   < > 2.5* 2.3* 2.5*   < > = values in this interval not displayed.   Recent Labs  Lab 02/18/22 1442  LIPASE 51   CBC: Recent Labs  Lab 02/19/22 1157 02/19/22 1157 02/20/22 1045 02/21/22 0217 02/22/22 0228 02/23/22 0705  WBC 9.6  --  8.1 7.5 6.1 5.2  NEUTROABS  --    < > 6.0 5.5 4.2 3.3  HGB 11.1*  --  10.9* 11.0* 11.1* 12.5  HCT 33.8*  --  33.3* 33.8* 33.6* 38.3  MCV 100.0  --  98.5 100.0 99.1 100.5*  PLT 191  --  186 209 184 195   < > = values in this interval not displayed.   Blood Culture    Component Value Date/Time   SDES BLOOD RIGHT HAND  02/13/2018 0404   SPECREQUEST  02/13/2018 0404    BOTTLES DRAWN AEROBIC AND ANAEROBIC Blood Culture adequate volume   CULT  02/13/2018 0404    NO GROWTH 5 DAYS Performed at [facility], [place] [address], [place], [place] [address]    REPTSTATUS 02/18/2018 FINAL 02/13/2018 0404    Cardiac Enzymes: No results for input(s): "CKTOTAL", "CKMB", "CKMBINDEX", "TROPONINI" in the last 168 hours. CBG: Recent Labs  Lab 02/22/22 0731 02/22/22 1159 02/22/22 1619 02/22/22 2219 02/23/22 0824  GLUCAP 98 186* 228* 209* 141*   Iron Studies: No results for input(s): "IRON", "TIBC", "TRANSFERRIN", "FERRITIN" in the last 72 hours. @lablastinr3 @ Studies/Results: DG CHEST PORT 1 VIEW  Result Date: 02/23/2022 CLINICAL DATA:  [id number]; wheezing EXAM: PORTABLE CHEST 1 VIEW COMPARISON:  None Available. FINDINGS: The heart size and mediastinal contours are mildly enlarged, stable. Stable atheromatous calcifications of the arch of the aorta. There is bilateral  pleural effusion seen greater on the right with likely superimposed bibasilar atelectasis and has increased in the interim as well. There is a mild central pulmonary vascular congestion seen and is more conspicuous in the present study. The visualized skeletal structures are unremarkable. IMPRESSION: Mild bilateral pleural effusion greater on the right with superimposed bibasilar atelectasis and have increased in the interim. There is mild central pulmonary vascular congestion seen and has increased in the interim as well. Electronically Signed   By: Frazier Richards M.D.   On: 02/23/2022 08:17   DG Abd 1 View  Result Date: 02/22/2022 CLINICAL DATA:  Abdominal pain. EXAM: ABDOMEN - 1 VIEW COMPARISON:  None Available. FINDINGS: Oral contrast material is seen in distal small bowel loops. No evidence of dilated bowel loops. Aortoiliac vascular calcification noted. IMPRESSION: Unremarkable bowel gas pattern, with oral contrast material and nondilated distal  small bowel loops. Electronically Signed   By: Marlaine Hind M.D.   On: 02/22/2022 21:33   DG ESOPHAGUS W SINGLE CM (SOL OR THIN BA)  Result Date: 02/22/2022 CLINICAL DATA:  Patient presented to ED with 2 week history of reduced p.o. intake. Patient referred for single contrast medium esophagram due to fluid-filled esophagus noted on previous CT. EXAM: ESOPHAGUS/BARIUM SWALLOW/TABLET STUDY TECHNIQUE: Single contrast examination was performed using thin liquid barium. This exam was performed by Narda Rutherford, NP, and was supervised and interpreted by Nelson Chimes. MD. FLUOROSCOPY: Radiation Exposure Index (as provided by the fluoroscopic device): 30.20 mGy Kerma COMPARISON:  None Available. FINDINGS: Limited examination due to the patient's inability to stand or reposition on fluoroscopy table and unable to follow commands. Swallowing: Appears normal. No vestibular penetration or aspiration seen. Pharynx: Unremarkable. Esophagus: No esophageal mass or stricture. Esophageal motility: Age-related esophageal dysmotility with decreased primary stripping wave and increased tertiary contractions. Hiatal Hernia: None. Gastroesophageal reflux: None visualized. Ingested 25mm barium tablet: Not given Other: None. IMPRESSION: Limited examination due to patient's inability to follow commands. Age-related esophageal dysmotility with poor primary contractions an abnormal disordered tertiary contractions with delayed passage of barium. No mass or stricture. Read by: Narda Rutherford, AGNP-BC Electronically Signed   By: Nelson Chimes M.D.   On: 02/22/2022 13:41   Medications:  cefTRIAXone (ROCEPHIN)  IV 2 g (02/22/22 2238)   ferric gluconate (FERRLECIT) IVPB Stopped (02/21/22 1222)   metronidazole 500 mg (02/23/22 1016)    (feeding supplement) PROSource Plus  30 mL Oral BID BM   Chlorhexidine Gluconate Cloth  6 each Topical Q0600   feeding supplement  1 Container Oral TID BM   heparin  5,000 Units Subcutaneous Q8H   insulin  aspart  0-6 Units Subcutaneous TID WC   insulin glargine-yfgn  6 Units Subcutaneous QHS   midodrine  5 mg Oral TID WC   multivitamin  1 tablet Oral QHS   polyethylene glycol  17 g Oral Daily    Dialysis Orders: [facility] 4h  400/500  52kg  2/2 baht  P2  Heparin 2000 LUA AVG - hep B labs here: pend - last HD 8/19, post wt 53.3kg, last wk edw lowered 3kg - last Hb 11.8 on 8/15, tsat 26%, no esa - iron sucrose IV 100 q hd thru 8/26 - doxercalciferol 5 ug IV tiw  Assessment/Plan: 1. Enterocolitis - w/ diarrhea , poor po intake for 2 weeks. Started on IV abx here, GI panel negative 2. AMS - some of this is chronic, possibly dementia. Not uremia given good HD compliance and low B/Cr. Family states has  been off and on for 1-2 yrs. Gabapentin for this patient may be contributing, has been discontinued. Son says she seems to be at baseline mental status 3. Dysphagia - per pmd, SLP following 4. ^Troponin - per cardiology > no CP or ischemic symptoms, follow trop trend. No further work up planned for now.  5. ESRD - on HD TTS. Has not missed dialysis. Continue TTS schedule 6. HTN/ vol - BP's low normal here, on room air. Euvolemic on exam. Follow.  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 Is getting IV Fe load at OP unit, will cont thru 8/26.  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 Phos slightly elevated. No binders ordered but is on liquid diet, will plan  to restart binders once back on regular diet 9. Hypokalemia: K+ 3.3. Likely due to decreased PO intake. 4K bath now available, will use with HD today.     Anice Paganini, PA-C 02/23/2022, 11:29 AM  [facility] Pager: [phone]

## 2022-02-23 NOTE — Progress Notes (Signed)
PROGRESS NOTE    Sarah Kelley  ZOX:[id number] DOB: 1950-07-18 DOA: 02/18/2022 PCP: Sarah Rakes, MD  Chief Complaint  Patient presents with   Failure To Thrive    Brief Narrative:  HPI per Dr. Johnathan Hausen Kelley is Sarah Kelley 71 y.o. female with medical history significant of ESRD on TTS dialysis, last had dialysis earlier today.  HTN, HLD, DM2 on insulin.   Pt presents to ED with 2 week h/o reduced PO intake, diaphoresis over past 3-4 days and profuse diarrhea.   Per PT: feels like food gets stuck in throat.   No CP, SOB.   No sick contacts in family, no one else in family is sick.    Assessment & Plan:   Principal Problem:   Enterocolitis Active Problems:   Dysphagia   ESRD (end stage renal disease) (HCC)   Elevated troponin   Essential hypertension   Type 2 diabetes mellitus with chronic kidney disease on chronic dialysis, with long-term current use of insulin (HCC)   Hypothyroidism   Bilateral pleural effusion   Dementia without behavioral disturbance (HCC)   Hypokalemia   Malnutrition of moderate degree   Assessment and Plan: * Enterocolitis Presumed infectious initially on admission, with frequent diarrhea and decreased PO intake. GI pathogen pnl negative. C.Diff neg CT abdomen and pelvis concerning for colitis.  Continue empiric IV Rocephin and Flagyl, and treat empirically for total of 5 days. See ESRD below regarding fluid status GI following and appreciate their input and recommendations.  Dysphagia Probably the underlying cause of reduced PO intake, even more so than the GI illness. Fluid filled esophagus on CT noted  Esophagram limited due to patient's inability to follow commands, notable for age related esophageal dysmotility with poor primary contractions and abnormal disordered tertiary contractions with delayed passage of barium  GI consulted, recommend adjusting diet to foods/beverages she tolerates, SLP recs  Elevated  troponin Cards consulted on patient They indicate that this is likely demand ischemia Troponin is elevated but seem to be flattened.  Patient with doubt any chest pain. 2D echo from 02/13/2018 with EF of 55 to 81%,XBJY, grade 1 diastolic dysfunction. Repeat 2D echo with EF of 78%,GNFA, grade 1 diastolic dysfunction, normal right ventricular systolic function, mild to moderate MVR, no evidence of mitral stenosis. Patient seen in consultation by cardiology who reviewed 2D echo and EKG and will not pursue any further cardiac evaluation at this time, cardiology feels troponins flat not consistent with ACS and also feel patient is not an ideal candidate for invasive work-up.  ESRD (end stage renal disease) ([place]) Appears to be possibly volume overloaded with mod B pleural effusions on CT scan despite recent GI illness with diarrhea, poor PO intake, and dialysis on 02/18/2022 prior to presentation in the ED. Nephrology following and patient currently in HD Volume management per renal   Hypothyroidism TSH wnl with elevated free T4 today, unclear significance Will need outpatient follow up of thyroid function tests Looks like "thyroid disease" is on her [place] list, but I dont see where shes on any sort of thyroid hormone replacement. Likely needs outpatient follow-up.   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 -Continue CBGs to before meals and at bedtime.   -Continue Semglee 6 units daily, SSI.    Essential hypertension Soft BPs. -Continue to hold BP medications. -Patient started on midodrine.  Bilateral pleural effusion Repeat CXR 8/24 with mild  bilateral effusion, increased mild central pulm vascular congestion Volume per renal   Goals of care, counseling/discussion Will c/s palliative care to help start [place] conversations  Hypokalemia - Likely in part secondary to GI  losses. -Defer repletion to nephrology as patient ESRD on HD.  Dementia without behavioral disturbance ([place]) - Stable. -Patient with Sarah Kelley severe dementia, monitor for now.          DVT prophylaxis: heparin Code Status: full Family Communication: son at bedsdie Disposition:   Status is: Inpatient Remains inpatient appropriate because: need for continued management   Consultants:  Renal GI  Procedures:  none  Antimicrobials:  Anti-infectives (From admission, onward)    Start     Dose/Rate Route Frequency Ordered Stop   02/18/22 2230  cefTRIAXone (ROCEPHIN) 2 g in sodium chloride 0.9 % 100 mL IVPB        2 g 200 mL/hr over 30 Minutes Intravenous Every 24 hours 02/18/22 2217     02/18/22 2230  metroNIDAZOLE (FLAGYL) IVPB 500 mg        500 mg 100 mL/hr over 60 Minutes Intravenous Every 12 hours 02/18/22 2217         Subjective: Unintelligible speech Discussed plan with son via interpreter RN notes still coughing with eating No new complaints per son, agreeable with palliative c/s  Objective: Vitals:   02/22/22 1000 02/22/22 1700 02/22/22 2247 02/23/22 0900  BP:  (!) 120/39 (!) 123/36 96/74  Pulse: 68  71   Resp: 15 17 17 20   Temp:  99 F (37.2 C) 98.9 F (37.2 C) 98.8 F (37.1 C)  TempSrc:  Oral Oral Oral  SpO2: 92%  97% 96%  Weight:      Height:        Intake/Output Summary (Last 24 hours) at 02/23/2022 1428 Last data filed at 02/23/2022 1427 Gross per 24 hour  Intake 60 ml  Output --  Net 60 ml   Filed Weights   02/20/22 0659 02/21/22 1300  Weight: 56.7 kg 56 kg    Examination:  General: No acute distress. Cardiovascular: RRR Lungs: unlabored Abdomen: Soft, nontender, nondistended  Neurological: unintelligble speech, moving all extremities Extremities: No clubbing or cyanosis. No edema.   Data Reviewed: I have personally reviewed following labs and imaging studies  CBC: Recent Labs  Lab 02/19/22 1157 02/20/22 1045 02/21/22 0217  02/22/22 0228 02/23/22 0705  WBC 9.6 8.1 7.5 6.1 5.2  NEUTROABS  --  6.0 5.5 4.2 3.3  HGB 11.1* 10.9* 11.0* 11.1* 12.5  HCT 33.8* 33.3* 33.8* 33.6* 38.3  MCV 100.0 98.5 100.0 99.1 100.5*  PLT 191 186 209 184 387    Basic Metabolic Panel: Recent Labs  Lab 02/18/22 1453 02/19/22 1157 02/20/22 1045 02/21/22 0217 02/22/22 0228 02/23/22 0705  NA  --  138 136 136 136 138  K  --  3.6 3.1* 3.0* 3.2* 3.3*  CL  --  98 97* 97* 98 96*  CO2  --  27 24 25 28 26   GLUCOSE  --  193* 123* 206* 88 155*  BUN  --  27* 35* 39* 18 28*  CREATININE  --  4.28* 5.93* 6.82* 4.64* 6.76*  CALCIUM  --  9.8 9.4 9.1 9.7 9.8  MG 2.1  --  1.9 2.0  --  1.8  PHOS  --  5.3* 6.0* 6.3* 5.3* 6.7*    GFR: Estimated Creatinine Clearance: 5.9 mL/min (Ahren Pettinger) (by C-G formula based on SCr of 6.76 mg/dL (H)).  Liver Function Tests:  Recent Labs  Lab 02/18/22 1405 02/19/22 1157 02/20/22 1045 02/21/22 0217 02/22/22 0228 02/23/22 0705  AST 46* 38  --   --   --  35  ALT 28 20  --   --   --  16  ALKPHOS 166* 126  --   --   --  110  BILITOT 0.8 0.9  --   --   --  0.7  PROT 7.1 6.1*  --   --   --  6.7  ALBUMIN 2.7* 2.3* 2.3* 2.5* 2.3* 2.5*    CBG: Recent Labs  Lab 02/22/22 1159 02/22/22 1619 02/22/22 2219 02/23/22 0824 02/23/22 1149  GLUCAP 186* 228* 209* 141* 163*     Recent Results (from the past 240 hour(s))  C Difficile Quick Screen w PCR reflex     Status: None   Collection Time: 02/18/22  5:41 PM   Specimen: STOOL  Result Value Ref Range Status   C Diff antigen NEGATIVE NEGATIVE Final   C Diff toxin NEGATIVE NEGATIVE Final   C Diff interpretation No C. difficile detected.  Final    Comment: Performed at [facility], [place] [address] [place], [place] [address]  Gastrointestinal Panel by PCR , Stool     Status: None   Collection Time: 02/18/22  5:42 PM   Specimen: STOOL  Result Value Ref Range Status   Campylobacter species NOT DETECTED NOT DETECTED Final   Plesimonas shigelloides NOT  DETECTED NOT DETECTED Final   Salmonella species NOT DETECTED NOT DETECTED Final   Yersinia enterocolitica NOT DETECTED NOT DETECTED Final   Vibrio species NOT DETECTED NOT DETECTED Final   Vibrio cholerae NOT DETECTED NOT DETECTED Final   Enteroaggregative E coli (EAEC) NOT DETECTED NOT DETECTED Final   Enteropathogenic E coli (EPEC) NOT DETECTED NOT DETECTED Final   Enterotoxigenic E coli (ETEC) NOT DETECTED NOT DETECTED Final   Shiga like toxin producing E coli (STEC) NOT DETECTED NOT DETECTED Final   Shigella/Enteroinvasive E coli (EIEC) NOT DETECTED NOT DETECTED Final   Cryptosporidium NOT DETECTED NOT DETECTED Final   Cyclospora cayetanensis NOT DETECTED NOT DETECTED Final   Entamoeba histolytica NOT DETECTED NOT DETECTED Final   Giardia lamblia NOT DETECTED NOT DETECTED Final   Adenovirus F40/41 NOT DETECTED NOT DETECTED Final   Astrovirus NOT DETECTED NOT DETECTED Final   Norovirus GI/GII NOT DETECTED NOT DETECTED Final   Rotavirus Theotis Gerdeman NOT DETECTED NOT DETECTED Final   Sapovirus (I, II, IV, and V) NOT DETECTED NOT DETECTED Final    Comment: Performed at [facility], [address], [place], [place] [address]         Radiology Studies: DG CHEST PORT 1 VIEW  Result Date: 02/23/2022 CLINICAL DATA:  [id number]; wheezing EXAM: PORTABLE CHEST 1 VIEW COMPARISON:  None Available. FINDINGS: The heart size and mediastinal contours are mildly enlarged, stable. Stable atheromatous calcifications of the arch of the aorta. There is bilateral pleural effusion seen greater on the right with likely superimposed bibasilar atelectasis and has increased in the interim as well. There is [place] mild central pulmonary vascular congestion seen and is more conspicuous in the present study. The visualized skeletal structures are unremarkable. IMPRESSION: Mild bilateral pleural effusion greater on the right with superimposed bibasilar atelectasis and have increased in the interim. There is mild central  pulmonary vascular congestion seen and has increased in the interim as well. Electronically Signed   By: Frazier Richards M.D.   On: 02/23/2022 08:17  DG Abd 1 View  Result Date: 02/22/2022 CLINICAL DATA:  Abdominal pain. EXAM: ABDOMEN - 1 VIEW COMPARISON:  None Available. FINDINGS: Oral contrast material is seen in distal small bowel loops. No evidence of dilated bowel loops. Aortoiliac vascular calcification noted. IMPRESSION: Unremarkable bowel gas pattern, with oral contrast material and nondilated distal small bowel loops. Electronically Signed   By: Marlaine Hind M.D.   On: 02/22/2022 21:33   DG ESOPHAGUS W SINGLE CM (SOL OR THIN BA)  Result Date: 02/22/2022 CLINICAL DATA:  Patient presented to ED with 2 week history of reduced p.o. intake. Patient referred for single contrast medium esophagram due to fluid-filled esophagus noted on previous CT. EXAM: ESOPHAGUS/BARIUM SWALLOW/TABLET STUDY TECHNIQUE: Single contrast examination was performed using thin liquid barium. This exam was performed by Narda Rutherford, NP, and was supervised and interpreted by Nelson Chimes. MD. FLUOROSCOPY: Radiation Exposure Index (as provided by the fluoroscopic device): 30.20 mGy Kerma COMPARISON:  None Available. FINDINGS: Limited examination due to the patient's inability to stand or reposition on fluoroscopy table and unable to follow commands. Swallowing: Appears normal. No vestibular penetration or aspiration seen. Pharynx: Unremarkable. Esophagus: No esophageal mass or stricture. Esophageal motility: Age-related esophageal dysmotility with decreased primary stripping wave and increased tertiary contractions. Hiatal Hernia: None. Gastroesophageal reflux: None visualized. Ingested 70mm barium tablet: Not given Other: None. IMPRESSION: Limited examination due to patient's inability to follow commands. Age-related esophageal dysmotility with poor primary contractions an abnormal disordered tertiary contractions with delayed passage  of barium. No mass or stricture. Read by: Narda Rutherford, AGNP-BC Electronically Signed   By: Nelson Chimes M.D.   On: 02/22/2022 13:41        Scheduled Meds:  (feeding supplement) PROSource Plus  30 mL Oral BID BM   Chlorhexidine Gluconate Cloth  6 each Topical Q0600   feeding supplement  1 Container Oral TID BM   heparin  5,000 Units Subcutaneous Q8H   insulin aspart  0-6 Units Subcutaneous TID WC   insulin glargine-yfgn  6 Units Subcutaneous QHS   midodrine  5 mg Oral TID WC   multivitamin  1 tablet Oral QHS   polyethylene glycol  17 g Oral Daily   Continuous Infusions:  cefTRIAXone (ROCEPHIN)  IV 2 g (02/22/22 2238)   ferric gluconate (FERRLECIT) IVPB Stopped (02/21/22 1222)   metronidazole 500 mg (02/23/22 1016)     LOS: 4 days    Time spent: over 30 min    Fayrene Helper, MD [facility]   To contact the attending provider between 7A-7P or the covering provider during after hours 7P-7A, please log into the web site [email or web address] and access using universal [place] password for that web site. If you do not have the password, please call the hospital operator.  02/23/2022, 2:28 PM

## 2022-02-23 NOTE — Assessment & Plan Note (Addendum)
Will c/s palliative care to help start [place] conversations Appreciate assistance, she's been transitioned to DNR/DNI.  Will have another meeting Monday.

## 2022-02-23 NOTE — Progress Notes (Addendum)
Progress Note   Subjective  Chief Complaint: Diarrhea and Dysphagia  Today, patient is seen with son by her bedside who does assist with history.  He explains that she thinks she is still had trouble with some diarrhea, though per nursing notes overnight she had not had a bowel movement in "a while" abdominal x-ray on revealing when she complained of some abdominal pain she was given a dose of fentanyl.  Again patient unable to elicit history due to dementia.  Son tells me she eats normal food at home.   Objective   Vital signs in last 24 hours: Temp:  [98.8 F (37.1 C)-99 F (37.2 C)] 98.8 F (37.1 C) (08/24 0900) Pulse Rate:  [71] 71 (08/23 2247) Resp:  [17-20] 20 (08/24 0900) BP: (96-123)/(36-74) 96/74 (08/24 0900) SpO2:  [96 %-97 %] 96 % (08/24 0900) Last BM Date : 02/20/22 (unknown to family been awhile) General:   Elderly, Hispanic female in NAD Heart:  Regular rate and rhythm; no murmurs Lungs: Respirations even and unlabored, lungs CTA bilaterally Abdomen:  Soft, mild to moderate generalized TTP and nondistended. Normal bowel sounds. Psych: Dementia  Intake/Output from previous day: 08/23 0701 - 08/24 0700 In: 150 [P.O.:50; IV Piggyback:100] Out: -   Lab Results: Recent Labs    02/21/22 0217 02/22/22 0228 02/23/22 0705  WBC 7.5 6.1 5.2  HGB 11.0* 11.1* 12.5  HCT 33.8* 33.6* 38.3  PLT 209 184 195   BMET Recent Labs    02/21/22 0217 02/22/22 0228 02/23/22 0705  NA 136 136 138  K 3.0* 3.2* 3.3*  CL 97* 98 96*  CO2 25 28 26   GLUCOSE 206* 88 155*  BUN 39* 18 28*  CREATININE 6.82* 4.64* 6.76*  CALCIUM 9.1 9.7 9.8   LFT Recent Labs    02/23/22 0705  PROT 6.7  ALBUMIN 2.5*  AST 35  ALT 16  ALKPHOS 110  BILITOT 0.7   Studies/Results: DG CHEST PORT 1 VIEW  Result Date: 02/23/2022 CLINICAL DATA:  142230; wheezing EXAM: PORTABLE CHEST 1 VIEW COMPARISON:  None Available. FINDINGS: The heart size and mediastinal contours are mildly enlarged,  stable. Stable atheromatous calcifications of the arch of the aorta. There is bilateral pleural effusion seen greater on the right with likely superimposed bibasilar atelectasis and has increased in the interim as well. There is a mild central pulmonary vascular congestion seen and is more conspicuous in the present study. The visualized skeletal structures are unremarkable. IMPRESSION: Mild bilateral pleural effusion greater on the right with superimposed bibasilar atelectasis and have increased in the interim. There is mild central pulmonary vascular congestion seen and has increased in the interim as well. Electronically Signed   By: Frazier Richards M.D.   On: 02/23/2022 08:17   DG Abd 1 View  Result Date: 02/22/2022 CLINICAL DATA:  Abdominal pain. EXAM: ABDOMEN - 1 VIEW COMPARISON:  None Available. FINDINGS: Oral contrast material is seen in distal small bowel loops. No evidence of dilated bowel loops. Aortoiliac vascular calcification noted. IMPRESSION: Unremarkable bowel gas pattern, with oral contrast material and nondilated distal small bowel loops. Electronically Signed   By: Marlaine Hind M.D.   On: 02/22/2022 21:33   DG ESOPHAGUS W SINGLE CM (SOL OR THIN BA)  Result Date: 02/22/2022 CLINICAL DATA:  Patient presented to ED with 2 week history of reduced p.o. intake. Patient referred for single contrast medium esophagram due to fluid-filled esophagus noted on previous CT. EXAM: ESOPHAGUS/BARIUM SWALLOW/TABLET STUDY TECHNIQUE: Single contrast examination  was performed using thin liquid barium. This exam was performed by Narda Rutherford, NP, and was supervised and interpreted by Nelson Chimes. MD. FLUOROSCOPY: Radiation Exposure Index (as provided by the fluoroscopic device): 30.20 mGy Kerma COMPARISON:  None Available. FINDINGS: Limited examination due to the patient's inability to stand or reposition on fluoroscopy table and unable to follow commands. Swallowing: Appears normal. No vestibular penetration or  aspiration seen. Pharynx: Unremarkable. Esophagus: No esophageal mass or stricture. Esophageal motility: Age-related esophageal dysmotility with decreased primary stripping wave and increased tertiary contractions. Hiatal Hernia: None. Gastroesophageal reflux: None visualized. Ingested 53mm barium tablet: Not given Other: None. IMPRESSION: Limited examination due to patient's inability to follow commands. Age-related esophageal dysmotility with poor primary contractions an abnormal disordered tertiary contractions with delayed passage of barium. No mass or stricture. Read by: Narda Rutherford, AGNP-BC Electronically Signed   By: Nelson Chimes M.D.   On: 02/22/2022 13:41      Assessment / Plan:   Assessment: 1.  Diarrhea: CT showing mild small bowel wall thickening, focal pericolonic edema suspected due to volume overload, stool studies negative, patient was on empiric antibiotics with slow improvement in diarrhea 2.  Dysphagia: Patulous esophagus on CT, likely chronic, SLP evaluation grossly within normal notes, esophagram as above with dysmotility and poor primary contractions and abnormal disorder tertiary contractions  Plan: 1.  Continue Imodium as needed for diarrhea 2.  Patient is already had speech path eval and was not noted to have any oropharyngeal component of dysphagia. 3.  Do not think patient needs any GI procedures, we will likely sign off today.  Thank you for your kind consultation.    LOS: 4 days   Levin Erp  02/23/2022, 10:25 AM    Attending Physician Note   I have taken an interval history, reviewed the chart and examined the patient. I performed a substantive portion of this encounter, including complete performance of at least one of the key components, in conjunction with the APP. I agree with the APP's note, impression and recommendations with my edits. My additional impressions and recommendations are as follows.   *Dysphagia. Esophogram shows esophageal  dysmotility without a mass or stricture. No further evaluation needed. Adjust diet to foods, beverages that she tolerates and follow SLP recommendations.   *Diarrhea, resolved. Nursing reports no diarrhea. Mild abdominal pain yesterday with an unremarkable KUB. Imodium 1-2 po tid prn for diarrhea mgmt. No further GI evaluation at this time.   GI signing off. No plans for outpatient GI follow up at this time however with Dr. Tarri Glenn if needed in the future.   Lucio Edward, MD [facility] See AM[facility], [facility], for our on call provider

## 2022-02-23 NOTE — Progress Notes (Signed)
   Palliative Medicine Inpatient Follow Up Note   Palliative care has acknowledged the consultation for [facility].   Patients son, [name] has agreed to meet with the Palliative medicine team in his mothers presence tomorrow at 1PM.   We will arrange for a Spanish interpretor to be present.  No Charge  [place] Team Team Cell Phone: [phone] Please utilize secure chat with additional questions, if there is no response within 30 minutes please call the above phone number  Palliative Medicine Team providers are available by phone from 7am to 7pm daily and can be reached through the team cell phone.  Should this patient require assistance outside of these hours, please call the patient's attending physician.

## 2022-02-23 NOTE — Plan of Care (Signed)
  Problem: Coping: Goal: Ability to adjust to condition or change in health will improve Outcome: Progressing   Problem: Fluid Volume: Goal: Ability to maintain a balanced intake and output will improve Outcome: Progressing   Problem: Metabolic: Goal: Ability to maintain appropriate glucose levels will improve Outcome: Progressing   Problem: Nutritional: Goal: Maintenance of adequate nutrition will improve Outcome: Progressing   Problem: Skin Integrity: Goal: Risk for impaired skin integrity will decrease Outcome: Progressing   Problem: Tissue Perfusion: Goal: Adequacy of tissue perfusion will improve Outcome: Progressing   Problem: Pain Managment: Goal: General experience of comfort will improve Outcome: Progressing   Problem: Safety: Goal: Ability to remain free from injury will improve Outcome: Progressing   Problem: Skin Integrity: Goal: Risk for impaired skin integrity will decrease Outcome: Progressing

## 2022-02-24 DIAGNOSIS — R52 Pain, unspecified: Secondary | ICD-10-CM

## 2022-02-24 DIAGNOSIS — Z7189 Other specified counseling: Secondary | ICD-10-CM

## 2022-02-24 LAB — GLUCOSE, CAPILLARY
Glucose-Capillary: 74 mg/dL (ref 70–99)
Glucose-Capillary: 76 mg/dL (ref 70–99)
Glucose-Capillary: 60 mg/dL — ABNORMAL LOW (ref 70–99)
Glucose-Capillary: 157 mg/dL — ABNORMAL HIGH (ref 70–99)
Glucose-Capillary: 119 mg/dL — ABNORMAL HIGH (ref 70–99)

## 2022-02-24 LAB — CBC WITH DIFFERENTIAL/PLATELET
Abs Immature Granulocytes: 0.11 10*3/uL — ABNORMAL HIGH (ref 0.00–0.07)
Basophils Absolute: 0.1 10*3/uL (ref 0.0–0.1)
Basophils Relative: 2 %
Eosinophils Absolute: 0.3 10*3/uL (ref 0.0–0.5)
Eosinophils Relative: 5 %
HCT: 41.7 % (ref 36.0–46.0)
Hemoglobin: 13.5 g/dL (ref 12.0–15.0)
Immature Granulocytes: 2 %
Lymphocytes Relative: 18 %
Lymphs Abs: 1.2 10*3/uL (ref 0.7–4.0)
MCH: 32.3 pg (ref 26.0–34.0)
MCHC: 32.4 g/dL (ref 30.0–36.0)
MCV: 99.8 fL (ref 80.0–100.0)
Monocytes Absolute: 0.8 10*3/uL (ref 0.1–1.0)
Monocytes Relative: 12 %
Neutro Abs: 4.2 10*3/uL (ref 1.7–7.7)
Neutrophils Relative %: 61 %
Platelets: 188 10*3/uL (ref 150–400)
RBC: 4.18 MIL/uL (ref 3.87–5.11)
RDW: 15.5 % (ref 11.5–15.5)
WBC: 6.6 10*3/uL (ref 4.0–10.5)
nRBC: 0 % (ref 0.0–0.2)

## 2022-02-24 LAB — PHOSPHORUS: Phosphorus: 4.1 mg/dL (ref 2.5–4.6)

## 2022-02-24 LAB — COMPREHENSIVE METABOLIC PANEL
ALT: 19 U/L (ref 0–44)
AST: 33 U/L (ref 15–41)
Albumin: 2.6 g/dL — ABNORMAL LOW (ref 3.5–5.0)
Alkaline Phosphatase: 107 U/L (ref 38–126)
Anion gap: 14 (ref 5–15)
BUN: 10 mg/dL (ref 8–23)
CO2: 25 mmol/L (ref 22–32)
Calcium: 9.5 mg/dL (ref 8.9–10.3)
Chloride: 99 mmol/L (ref 98–111)
Creatinine, Ser: 4.21 mg/dL — ABNORMAL HIGH (ref 0.44–1.00)
GFR, Estimated: 11 mL/min — ABNORMAL LOW (ref >60)
Glucose, Bld: 86 mg/dL (ref 70–99)
Potassium: 3.5 mmol/L (ref 3.5–5.1)
Sodium: 138 mmol/L (ref 135–145)
Total Bilirubin: 0.4 mg/dL (ref 0.3–1.2)
Total Protein: 6.8 g/dL (ref 6.5–8.1)

## 2022-02-24 LAB — MAGNESIUM: Magnesium: 1.8 mg/dL (ref 1.7–2.4)

## 2022-02-24 MED ORDER — CHLORHEXIDINE GLUCONATE CLOTH 2 % EX PADS
6.0000 | MEDICATED_PAD | Freq: Every day | CUTANEOUS | Status: DC
  Administered 2022-02-25 – 2022-02-27 (×3): 6 via TOPICAL

## 2022-02-24 MED ORDER — FENTANYL CITRATE PF 50 MCG/ML IJ SOSY
25.0000 ug | PREFILLED_SYRINGE | INTRAMUSCULAR | Status: DC | PRN
  Administered 2022-02-25 – 2022-02-28 (×4): 25 ug via INTRAVENOUS
  Filled 2022-02-24 (×4): qty 1

## 2022-02-24 MED ORDER — OXYCODONE HCL 5 MG PO TABS
5.0000 mg | ORAL_TABLET | ORAL | Status: DC | PRN
  Administered 2022-02-26: 5 mg via ORAL
  Filled 2022-02-24 (×3): qty 1

## 2022-02-24 MED ORDER — SODIUM CHLORIDE 0.9 % IV SOLN
500.0000 mg | Freq: Once | INTRAVENOUS | Status: AC
  Administered 2022-02-24: 500 mg via INTRAVENOUS
  Filled 2022-02-24: qty 5

## 2022-02-24 MED ORDER — THIAMINE HCL 100 MG/ML IJ SOLN
100.0000 mg | Freq: Every day | INTRAMUSCULAR | Status: DC
  Administered 2022-02-26 – 2022-03-05 (×8): 100 mg via INTRAVENOUS
  Filled 2022-02-24 (×9): qty 2

## 2022-02-24 MED ORDER — DEXTROSE 10 % IV SOLN: INTRAVENOUS | Status: DC

## 2022-02-24 MED ORDER — DEXTROSE 50 % IV SOLN
12.5000 g | INTRAVENOUS | Status: AC
  Administered 2022-02-24: 12.5 g via INTRAVENOUS
  Filled 2022-02-24: qty 50

## 2022-02-24 NOTE — Consult Note (Signed)
Palliative Medicine Inpatient Consult Note  Consulting Provider: Elodia Florence., MD  Reason for consult:   [place] Palliative Medicine Consult  Reason for Consult? goals of care   02/24/2022  HPI:  Per intake H&P --> Sarah Kelley is a 71 y.o. female with medical history significant of ESRD on TTS dialysis, last had dialysis earlier today.  HTN, HLD, DM2 on insulin. Pt presents to ED with 2 week h/o reduced PO intake, diaphoresis over past 3-4 days and profuse diarrhea - GI consulted stool studies (-) though (+) colitis on BS antibiotics. (+) Dysphagia.   Palliative care has been asked to get involved to further address goals of care in the setting of overalll decline and failure to thrive.   Clinical Assessment/Goals of Care:  *Please note that this is a verbal dictation therefore any spelling or grammatical errors are due to the "[employer]" system interpretation.  I have reviewed medical records including EPIC notes, labs and imaging, received report from bedside RN, assessed the patient.  I assessed Sarah Kelley at bedside, she was smiling though not [place] speaking.  Per her son who is present at bedside she is having abdominal pain.   A family meeting was held this afternoon with Dr. Florene Glen, Trudee Grip, RN, and myself.  Family members present were patients six children: Mirian Mo, Willette Pa, [place], and two grandchildren: Janett Billow and Vira Blanco  We discussed the role of palliative care as a specialized field for patients who have chronic and serious illnesses.  We reviewed that often we aid in symptom management and further identifying goals of care.  Sarah Kelley is from [place] originally.  She has 11 children 6 of whom are local and over 60 grandchildren.  She is identified as a [place] in someone who loves her family.  She is someone who loves sweets/candy per her family.  She is a woman of faith and practices within  Catholicism.  Prior to hospitalization Sarah Kelley's son, [name] attended to all of her basic activities of daily living.  Patient's appetite has been dwindling for the past few weeks.  The context of the conversation is that Sarah Kelley has been hospitalized for the past six days. She has been on broad spectrum antibiotics in the setting of colitis though she has not improved.   [name] has notable dysphagia and an overall lack of appetite.  She has been in sever abdominal pain.   Dr. Florene Glen was able to speak to family about patient's underlying history of dementia and delirium on top of that causing her to have a lack of oral intake.  We reviewed various options inclusive of gastrostomy tube though identified that this would not be something which would be proven to help patient's clinical condition or prolong the quality of life she is living.  Dr. Florene Glen reviewed the patient's lack of improvement and introduced palliative care in terms of what to do in the long run if patient does not improve.  He was able to answer patient's family's questions regarding present clinical state.  After Dr. Florene Glen left we reviewed why in Sarah Kelley's case it would not benefit her to have a gastrostomy tube: health care professionals commonly rely on feeding tubes to supply nutrition to these severely demented patients. However, various studies have not shown use of feeding tubes to be effective in preventing malnutrition. Furthermore, they have not been demonstrated to prevent the occurrence or increase the healing of pressure sores, prevent aspiration pneumonia, provide comfort, improve  functional status, or extend life. High complication rates, increased use of restraints, and other adverse effects further increase the burden of feeding tubes in severely demented patients.   We further discussed that if present neglects to improve then continuation of hemodialysis would not be warranted.  We reviewed that if hemodialysis were stopped  she would likely only be on earth for days to weeks at the longest.  Patient's family is baffled in regards to why her abdominal pain is not improved.  Dr. Florene Glen does plan to speak to gastroenterology again to see if there is anything additional I can be offered.  Patient's family at this point in time would like [place] to optimize patient's pain control.  We discussed the risks and benefits of starting opioid medications and they shared that above all else they would like for [employer] to not experience terrible abdominal pain.  We spent a great amount of time discussing dementia and the various stages of dementia as well as what to do when patients are at end-stage and suffer severe dysphagia.  We talked about the differences between aggressive modalities of care and comfort oriented modalities of care.  We reviewed patient's present cardiopulmonary resuscitation status and discussed that if she were to indoor cardiac arrest that her outcomes in the trauma her body would be put through would likely be quite poor.  Patient's family is clear that they do not want that all 6 children are in agreement as well as 2 grandchildren.  The decision was made to wait throughout the weekend to see how Sarah Kelley does.  We will plan to have a follow-up meeting on Monday at 37, if Sarah Kelley neglects to improve further conversations about comfort care will be had.  Decision Maker: Vern Claude [phone]   [phone]     SUMMARY OF RECOMMENDATIONS   DNAR/DNI  Continue current care  Plan for family meeting Monday at 42, if patient has not improved by then additional conversations will be had pertaining to comfort focused care  Patient's abdominal pain is significant - additional medications have been ordered as below the risks and benefits of these medications have been discussed with patient's family  Ongoing palliative care support  Code Status/Advance Care Planning: DNAR/DNI   Symptom Management:   Abdominal Pain: - Oxycodone 5mg  Po Q4H PRN - Fentanyl 86mcg Q2H PRN severe pain  Palliative Prophylaxis:  Aspiration, Bowel Regimen, Delirium Protocol, Frequent Pain Assessment, Oral Care, Palliative Wound Care, and Turn Reposition  Additional Recommendations (Limitations, Scope, Preferences): Continue current scope of care  Psycho-social/Spiritual:  Desire for further Chaplaincy support: Not presently Additional Recommendations: Education on chronic disease burden and comfort oriented care   Prognosis: Worrisome overall given patient's declining health state and lack of p.o. intake  Discharge Planning: Discharge plan is uncertain at this point  Vitals:   02/24/22 0851 02/24/22 1420  BP: (!) 125/103 111/75  Pulse:  63  Resp: 19 18  Temp: 97.9 F (36.6 C) 98 F (36.7 C)  SpO2: 100% 100%    Intake/Output Summary (Last 24 hours) at 02/24/2022 1505 Last data filed at 02/24/2022 0200 Gross per 24 hour  Intake 403.72 ml  Output 1.7 ml  Net 402.02 ml   Last Weight  Most recent update: 02/23/2022  6:37 PM    Weight  55.8 kg (123 lb 0.3 oz)            Gen: Elderly Hispanic female in no acute distress HEENT: moist mucous membranes CV: Regular rate and rhythm PULM:  On room air even and nonlabored ABD: soft/nontender/nondistended EXT: No edema Neuro: Alert and oriented to self  PPS: 10-20%   This conversation/these recommendations were discussed with patient primary care team, Dr. Florene Glen  Total Time: 120  Billing based on MDM: High  Problems Addressed: One acute or chronic illness or injury that poses a threat to life or bodily function  Amount and/or Complexity of Data: Category 3:Discussion of management or test interpretation with external physician/other qualified health care professional/appropriate source (not separately reported)  Risks: Parenteral controlled substances, Decision regarding hospitalization or escalation of hospital care, and Decision not to  resuscitate or to de-escalate care because of poor prognosis ______________________________________________________ [place] Team Team Cell Phone: [phone] Please utilize secure chat with additional questions, if there is no response within 30 minutes please call the above phone number  Palliative Medicine Team providers are available by phone from 7am to 7pm daily and can be reached through the team cell phone.  Should this patient require assistance outside of these hours, please call the patient's attending physician.

## 2022-02-24 NOTE — Progress Notes (Signed)
PROGRESS NOTE    Sarah Kelley  HYW:[id number] DOB: 1950/07/05 DOA: 02/18/2022 PCP: [name], MD  Chief Complaint  Patient presents with   Failure To Thrive    Brief Narrative:  HPI per Dr. Johnathan Hausen Sarah Kelley is Sarah Kelley 71 y.o. female with medical history significant of ESRD on TTS dialysis, last had dialysis earlier today.  HTN, HLD, DM2 on insulin.   Pt presents to ED with 2 week h/o reduced PO intake, diaphoresis over past 3-4 days and profuse diarrhea.   Per PT: feels like food gets stuck in throat.   No CP, SOB.   No sick contacts in family, no one else in family is sick.    Assessment & Plan:   Principal Problem:   Enterocolitis Active Problems:   Dysphagia   Goals of care, counseling/discussion   ESRD (end stage renal disease) (HCC)   Elevated troponin   Essential hypertension   Type 2 diabetes mellitus with chronic kidney disease on chronic dialysis, with long-term current use of insulin (HCC)   Hypothyroidism   Bilateral pleural effusion   Pain   Dementia without behavioral disturbance (HCC)   Hypokalemia   Malnutrition of moderate degree   Assessment and Plan: * Enterocolitis Presumed infectious initially on admission, with frequent diarrhea and decreased PO intake. GI pathogen pnl negative. C.Diff neg CT abdomen and pelvis concerning for colitis.  Continue empiric IV Rocephin and Flagyl No meaningful improvement, could consider repeat imaging, will discuss with family given ongoing goals of care See ESRD below regarding fluid status GI following and appreciate their input and recommendations.  Goals of care, counseling/discussion Will c/s palliative care to help start [place] conversations Appreciate assistance, she's been transitioned to DNR/DNI.  Will have another meeting Monday.    Dysphagia Probably the underlying cause of reduced PO intake, even more so than the GI illness. Fluid filled esophagus on CT noted  Esophagram  limited due to patient's inability to follow commands, notable for age related esophageal dysmotility with poor primary contractions and abnormal disordered tertiary contractions with delayed passage of barium  GI consulted, recommend adjusting diet to foods/beverages she tolerates SLP recommending thin liquids  Minimal documented PO intake  Elevated troponin Cards consulted on patient They indicate that this is likely demand ischemia Troponin is elevated but seem to be flattened.  Patient with doubt any chest pain. 2D echo from 02/13/2018 with EF of 55 to 48%,NIOE, grade 1 diastolic dysfunction. Repeat 2D echo with EF of 70%,JJKK, grade 1 diastolic dysfunction, normal right ventricular systolic function, mild to moderate MVR, no evidence of mitral stenosis. Patient seen in consultation by cardiology who reviewed 2D echo and EKG and will not pursue any further cardiac evaluation at this time, cardiology feels troponins flat not consistent with ACS and also feel patient is not an ideal candidate for invasive work-up.  ESRD (end stage renal disease) ([place]) Appears to be possibly volume overloaded with mod B pleural effusions on CT scan despite recent GI illness with diarrhea, poor PO intake, and dialysis on 02/18/2022 prior to presentation in the ED. Nephrology following and patient currently in HD Volume management per renal   Hypothyroidism TSH wnl with elevated free T4, unclear significance  Will need outpatient follow up of thyroid function tests Looks like "thyroid disease" is on her [place] list, but I dont see where shes on any sort of thyroid hormone replacement. Likely needs outpatient follow-up.   Type 2 diabetes mellitus with chronic kidney disease on chronic dialysis, with  long-term current use of insulin (HCC) -Hemoglobin A1c 7.7  (02/18/2022 ). -Continue CBGs to before meals and at bedtime.   - hypoglycemia today, hold basal, continue very sensitive SSI, will continue to monitor, has  d10 ordered for now  Essential hypertension Soft BPs. -Continue to hold BP medications. -Patient started on midodrine.  Pain Unclear cause, see below Delirium? Fentanyl prn ordered Will continue to monitor  Bilateral pleural effusion Repeat CXR 8/24 with mild bilateral effusion, increased mild central pulm vascular congestion Volume per renal   Hypokalemia - Likely in part secondary to GI losses. -Defer repletion to nephrology as patient ESRD on HD.  Dementia without behavioral disturbance ([place]) - Stable. -Patient with Sarah Kelley severe dementia, monitor for now. -delirium precautions - seems to be shouting out in pain, uncomfortable, but no corresponding exam findings noted to explain this (she notes pain in her but per son, exam notable for mild redness, abrasions, her abdomen is soft).  Will continue to monitor, recommend delirium precautions at this time.  Additional w/u as indicated.           DVT prophylaxis: heparin Code Status: full Family Communication: son at bedsdie, multiple family members at bedside  Disposition:   Status is: Inpatient Remains inpatient appropriate because: need for continued management   Consultants:  Renal GI  Procedures:  none  Antimicrobials:  Anti-infectives (From admission, onward)    Start     Dose/Rate Route Frequency Ordered Stop   02/18/22 2230  cefTRIAXone (ROCEPHIN) 2 g in sodium chloride 0.9 % 100 mL IVPB        2 g 200 mL/hr over 30 Minutes Intravenous Every 24 hours 02/18/22 2217 02/27/22 2229   02/18/22 2230  metroNIDAZOLE (FLAGYL) IVPB 500 mg        500 mg 100 mL/hr over 60 Minutes Intravenous Every 12 hours 02/18/22 2217         Subjective: Unintelligible speech Long conversation with family regarding goals of care  Objective: Vitals:   02/24/22 0112 02/24/22 0445 02/24/22 0851 02/24/22 1420  BP: (!) 116/52 (!) 112/37 (!) 125/103 111/75  Pulse: 70 60  63  Resp: 14 (!) 22 19 18   Temp:  98.2 F (36.8 C)  97.9 F (36.6 C) 98 F (36.7 C)  TempSrc:  Axillary Axillary Oral  SpO2: 99% 98% 100% 100%  Weight:      Height:        Intake/Output Summary (Last 24 hours) at 02/24/2022 1826 Last data filed at 02/24/2022 1600 Gross per 24 hour  Intake 403.72 ml  Output --  Net 403.72 ml   Filed Weights   02/21/22 1300 02/23/22 1500 02/23/22 1837  Weight: 56 kg 56.4 kg 55.8 kg    Examination:  General: No acute distress. Cardiovascular: RRR Lungs: unlabored Abdomen: Soft, nontender, nondistended Some abrasions to bottom, mild redness Neurological: disoriented, unintelligible speech  Extremities: No clubbing or cyanosis. No edema  Data Reviewed: I have personally reviewed following labs and imaging studies  CBC: Recent Labs  Lab 02/20/22 1045 02/21/22 0217 02/22/22 0228 02/23/22 0705 02/24/22 0148  WBC 8.1 7.5 6.1 5.2 6.6  NEUTROABS 6.0 5.5 4.2 3.3 4.2  HGB 10.9* 11.0* 11.1* 12.5 13.5  HCT 33.3* 33.8* 33.6* 38.3 41.7  MCV 98.5 100.0 99.1 100.5* 99.8  PLT 186 209 184 195 366    Basic Metabolic Panel: Recent Labs  Lab 02/18/22 1453 02/19/22 1157 02/20/22 1045 02/21/22 0217 02/22/22 0228 02/23/22 0705 02/24/22 0148  NA  --    < >  136 136 136 138 138  K  --    < > 3.1* 3.0* 3.2* 3.3* 3.5  CL  --    < > 97* 97* 98 96* 99  CO2  --    < > 24 25 28 26 25   GLUCOSE  --    < > 123* 206* 88 155* 86  BUN  --    < > 35* 39* 18 28* 10  CREATININE  --    < > 5.93* 6.82* 4.64* 6.76* 4.21*  CALCIUM  --    < > 9.4 9.1 9.7 9.8 9.5  MG 2.1  --  1.9 2.0  --  1.8 1.8  PHOS  --    < > 6.0* 6.3* 5.3* 6.7* 4.1   < > = values in this interval not displayed.    GFR: Estimated Creatinine Clearance: 9.5 mL/min (Sarah Kelley) (by C-G formula based on SCr of 4.21 mg/dL (H)).  Liver Function Tests: Recent Labs  Lab 02/18/22 1405 02/19/22 1157 02/20/22 1045 02/21/22 0217 02/22/22 0228 02/23/22 0705 02/24/22 0148  AST 46* 38  --   --   --  35 33  ALT 28 20  --   --   --  16 19  ALKPHOS 166*  126  --   --   --  110 107  BILITOT 0.8 0.9  --   --   --  0.7 0.4  PROT 7.1 6.1*  --   --   --  6.7 6.8  ALBUMIN 2.7* 2.3* 2.3* 2.5* 2.3* 2.5* 2.6*    CBG: Recent Labs  Lab 02/23/22 2059 02/24/22 0850 02/24/22 1122 02/24/22 1656 02/24/22 1738  GLUCAP 106* 74 76 60* 157*     Recent Results (from the past 240 hour(s))  C Difficile Quick Screen w PCR reflex     Status: None   Collection Time: 02/18/22  5:41 PM   Specimen: STOOL  Result Value Ref Range Status   C Diff antigen NEGATIVE NEGATIVE Final   C Diff toxin NEGATIVE NEGATIVE Final   C Diff interpretation No C. difficile detected.  Final    Comment: Performed at [facility], [place] [address] [place], [place] [address]  Gastrointestinal Panel by PCR , Stool     Status: None   Collection Time: 02/18/22  5:42 PM   Specimen: STOOL  Result Value Ref Range Status   Campylobacter species NOT DETECTED NOT DETECTED Final   Plesimonas shigelloides NOT DETECTED NOT DETECTED Final   Salmonella species NOT DETECTED NOT DETECTED Final   Yersinia enterocolitica NOT DETECTED NOT DETECTED Final   Vibrio species NOT DETECTED NOT DETECTED Final   Vibrio cholerae NOT DETECTED NOT DETECTED Final   Enteroaggregative E coli (EAEC) NOT DETECTED NOT DETECTED Final   Enteropathogenic E coli (EPEC) NOT DETECTED NOT DETECTED Final   Enterotoxigenic E coli (ETEC) NOT DETECTED NOT DETECTED Final   Shiga like toxin producing E coli (STEC) NOT DETECTED NOT DETECTED Final   Shigella/Enteroinvasive E coli (EIEC) NOT DETECTED NOT DETECTED Final   Cryptosporidium NOT DETECTED NOT DETECTED Final   Cyclospora cayetanensis NOT DETECTED NOT DETECTED Final   Entamoeba histolytica NOT DETECTED NOT DETECTED Final   Giardia lamblia NOT DETECTED NOT DETECTED Final   Adenovirus F40/41 NOT DETECTED NOT DETECTED Final   Astrovirus NOT DETECTED NOT DETECTED Final   Norovirus GI/GII NOT DETECTED NOT DETECTED Final   Rotavirus Sarah Kelley NOT DETECTED NOT DETECTED  Final   Sapovirus (I, II, IV,  and V) NOT DETECTED NOT DETECTED Final    Comment: Performed at [facility], [address] [place], [place] [address]         Radiology Studies: DG CHEST PORT 1 VIEW  Result Date: 02/23/2022 CLINICAL DATA:  [id number]; wheezing EXAM: PORTABLE CHEST 1 VIEW COMPARISON:  None Available. FINDINGS: The heart size and mediastinal contours are mildly enlarged, stable. Stable atheromatous calcifications of the arch of the aorta. There is bilateral pleural effusion seen greater on the right with likely superimposed bibasilar atelectasis and has increased in the interim as well. There is Sarah Kelley mild central pulmonary vascular congestion seen and is more conspicuous in the present study. The visualized skeletal structures are unremarkable. IMPRESSION: Mild bilateral pleural effusion greater on the right with superimposed bibasilar atelectasis and have increased in the interim. There is mild central pulmonary vascular congestion seen and has increased in the interim as well. Electronically Signed   By: Frazier Richards M.D.   On: 02/23/2022 08:17   DG Abd 1 View  Result Date: 02/22/2022 CLINICAL DATA:  Abdominal pain. EXAM: ABDOMEN - 1 VIEW COMPARISON:  None Available. FINDINGS: Oral contrast material is seen in distal small bowel loops. No evidence of dilated bowel loops. Aortoiliac vascular calcification noted. IMPRESSION: Unremarkable bowel gas pattern, with oral contrast material and nondilated distal small bowel loops. Electronically Signed   By: Marlaine Hind M.D.   On: 02/22/2022 21:33        Scheduled Meds:  (feeding supplement) PROSource Plus  30 mL Oral BID BM   [START ON 02/25/2022] Chlorhexidine Gluconate Cloth  6 each Topical Q0600   feeding supplement  1 Container Oral TID BM   heparin  5,000 Units Subcutaneous Q8H   insulin aspart  0-6 Units Subcutaneous TID WC   midodrine  5 mg Oral TID WC   multivitamin  1 tablet Oral QHS   polyethylene glycol  17 g  Oral Daily   [START ON 02/25/2022] thiamine (VITAMIN B1) injection  100 mg Intravenous Daily   Continuous Infusions:  cefTRIAXone (ROCEPHIN)  IV Stopped (02/23/22 2147)   dextrose 30 mL/hr at 02/24/22 1716   metronidazole 500 mg (02/24/22 0945)   thiamine (VITAMIN B1) injection       LOS: 5 days    Time spent: over 30 min    Fayrene Helper, MD [facility]   To contact the attending provider between 7A-7P or the covering provider during after hours 7P-7A, please log into the web site [email or web address] and access using universal [place] password for that web site. If you do not have the password, please call the hospital operator.  02/24/2022, 6:26 PM

## 2022-02-24 NOTE — Progress Notes (Signed)
Subjective: Seen in room, patient sleeping did awaken briefly, with help   of granddaughters present.  No complaints.  Noted a palliative care discussion with admit team and family members was taking place today.  Next dialysis tomorrow  Objective Vital signs in last 24 hours: Vitals:   02/24/22 0112 02/24/22 0445 02/24/22 0851 02/24/22 1420  BP: (!) 116/52 (!) 112/37 (!) 125/103 111/75  Pulse: 70 60  63  Resp: 14 (!) 22 19 18   Temp:  98.2 F (36.8 C) 97.9 F (36.6 C) 98 F (36.7 C)  TempSrc:  Axillary Axillary Oral  SpO2: 99% 98% 100% 100%  Weight:      Height:       Weight change:   Physical Exam: General: Elderly chronically ill female NAD slightly somnolent Heart: RRR no MRG Lungs: CTA nonlabored breathing Abdomen: NABS, soft, NTND Extremities: No pedal edema Dialysis Access: Left upper arm AV Gore-Tex graft positive bruit  Dialysis Orders: [facility] 4h  400/500  52kg  2/2 baht  P2  Heparin 2000 LUA AVG - hep B labs here: pend - last HD 8/19, post wt 53.3kg, last wk edw lowered 3kg - last Hb 11.8 on 8/15, tsat 26%, no esa - iron sucrose IV 100 q hd thru 8/26 - doxercalciferol 5 ug IV tiw   Problem/Plan: Enterocolitis with diarrhea and poor p.o. intake.  Empiric IV Rocephin and Flagyl per admit team, GI panel negative GI consulted ESRD -HD TTS K3.3 likely due to decreased p.o. intake use 4K bath when available follow-up potassium trend HTN/volume -BP stable, bilateral pleural effusions, on midodrine UF as tolerated does not appear to be volume overloaded on exam, O2 sat 100% on room air AMS-chronic probable dementia, now with element of possible med with gabapentin possibly contributing.  Little to no p.o. intake for swallowing thus palliative care seeing with family members today.  Noted if she does not progress this weekend may have other plans "/conversation about comfort care " plan for HD tomorrow for now Dysphagia-work-up per admitting Elevated troponin-per  cardiology no chest pain or ischemic symptoms follow-up trend no further work-up Anemia -Hgb 12.5 no ESA noted was getting IV iron through 8/26 Secondary hyperparathyroidism -corrected calcium high holding Hectorol for now 4.1 phosphorus no binders   Ernest Haber, PA-C [facility] [phone] 02/24/2022,4:25 PM  LOS: 5 days   Labs: Basic Metabolic Panel: Recent Labs  Lab 02/22/22 0228 02/23/22 0705 02/24/22 0148  NA 136 138 138  K 3.2* 3.3* 3.5  CL 98 96* 99  CO2 28 26 25   GLUCOSE 88 155* 86  BUN 18 28* 10  CREATININE 4.64* 6.76* 4.21*  CALCIUM 9.7 9.8 9.5  PHOS 5.3* 6.7* 4.1   Liver Function Tests: Recent Labs  Lab 02/19/22 1157 02/20/22 1045 02/22/22 0228 02/23/22 0705 02/24/22 0148  AST 38  --   --  35 33  ALT 20  --   --  16 19  ALKPHOS 126  --   --  110 107  BILITOT 0.9  --   --  0.7 0.4  PROT 6.1*  --   --  6.7 6.8  ALBUMIN 2.3*   < > 2.3* 2.5* 2.6*   < > = values in this interval not displayed.   Recent Labs  Lab 02/18/22 1442  LIPASE 51   No results for input(s): "AMMONIA" in the last 168 hours. CBC: Recent Labs  Lab 02/20/22 1045 02/21/22 0217 02/22/22 0228 02/23/22 0705 02/24/22 0148  WBC  8.1 7.5 6.1 5.2 6.6  NEUTROABS 6.0 5.5 4.2 3.3 4.2  HGB 10.9* 11.0* 11.1* 12.5 13.5  HCT 33.3* 33.8* 33.6* 38.3 41.7  MCV 98.5 100.0 99.1 100.5* 99.8  PLT 186 209 184 195 188   Cardiac Enzymes: No results for input(s): "CKTOTAL", "CKMB", "CKMBINDEX", "TROPONINI" in the last 168 hours. CBG: Recent Labs  Lab 02/23/22 0824 02/23/22 1149 02/23/22 2059 02/24/22 0850 02/24/22 1122  GLUCAP 141* 163* 106* 74 76    Studies/Results: DG CHEST PORT 1 VIEW  Result Date: 02/23/2022 CLINICAL DATA:  [id number]; wheezing EXAM: PORTABLE CHEST 1 VIEW COMPARISON:  None Available. FINDINGS: The heart size and mediastinal contours are mildly enlarged, stable. Stable atheromatous calcifications of the arch of the aorta. There is bilateral pleural  effusion seen greater on the right with likely superimposed bibasilar atelectasis and has increased in the interim as well. There is a mild central pulmonary vascular congestion seen and is more conspicuous in the present study. The visualized skeletal structures are unremarkable. IMPRESSION: Mild bilateral pleural effusion greater on the right with superimposed bibasilar atelectasis and have increased in the interim. There is mild central pulmonary vascular congestion seen and has increased in the interim as well. Electronically Signed   By: Frazier Richards M.D.   On: 02/23/2022 08:17   DG Abd 1 View  Result Date: 02/22/2022 CLINICAL DATA:  Abdominal pain. EXAM: ABDOMEN - 1 VIEW COMPARISON:  None Available. FINDINGS: Oral contrast material is seen in distal small bowel loops. No evidence of dilated bowel loops. Aortoiliac vascular calcification noted. IMPRESSION: Unremarkable bowel gas pattern, with oral contrast material and nondilated distal small bowel loops. Electronically Signed   By: Marlaine Hind M.D.   On: 02/22/2022 21:33   Medications:  cefTRIAXone (ROCEPHIN)  IV Stopped (02/23/22 2147)   metronidazole 500 mg (02/24/22 0945)    (feeding supplement) PROSource Plus  30 mL Oral BID BM   Chlorhexidine Gluconate Cloth  6 each Topical Q0600   feeding supplement  1 Container Oral TID BM   heparin  5,000 Units Subcutaneous Q8H   insulin aspart  0-6 Units Subcutaneous TID WC   insulin glargine-yfgn  6 Units Subcutaneous QHS   midodrine  5 mg Oral TID WC   multivitamin  1 tablet Oral QHS   polyethylene glycol  17 g Oral Daily

## 2022-02-24 NOTE — Assessment & Plan Note (Addendum)
Unclear cause, see below Delirium? Fentanyl prn ordered Will continue to monitor

## 2022-02-24 NOTE — Evaluation (Signed)
Clinical/Bedside Swallow Evaluation Patient Details  Name: Sarah Kelley MRN: [id number] Date of Birth: 01-28-1951  Today's Date: 02/24/2022 Time: SLP Start Time (ACUTE ONLY): 7782 SLP Stop Time (ACUTE ONLY): 0932 SLP Time Calculation (min) (ACUTE ONLY): 28 min  Past Medical History:  Past Medical History:  Diagnosis Date   Dementia without behavioral disturbance ([place]) 02/20/2022   Diabetes mellitus    ESRD (end stage renal disease) ([place])    dialysis T/Th/Sa   GERD (gastroesophageal reflux disease)    Hyperlipidemia    Hypertension    Iron deficiency anemia    MRSA infection    hx of   Neuropathy    Diabetic   Pneumonia 07/2016   Seizure-like activity ([place])    Sepsis ([place])    Thyroid disease    Wears dentures    Past Surgical History:  Past Surgical History:  Procedure Laterality Date   AMPUTATION Left 05/21/2015   Procedure: Left Foot 3rd Ray Amputation;  Surgeon: Newt Minion, MD;  Location: [facility];  Service: Orthopedics;  Laterality: Left;   AV FISTULA PLACEMENT Left 03/28/2016   Procedure: ARTERIOVENOUS (AV) FISTULA CREATION LEFT ARM;  Surgeon: Waynetta Sandy, MD;  Location: [facility];  Service: Vascular;  Laterality: Left;   AV FISTULA PLACEMENT Right 06/04/2019   Procedure: ARTERIOVENOUS (AV) FISTULA CREATION RIGHT ARM;  Surgeon: Waynetta Sandy, MD;  Location: [facility];  Service: Vascular;  Laterality: Right;   AV FISTULA PLACEMENT Left 03/11/2021   Procedure: INSERTION OF LEFT ARM ARTERIOVENOUS (AV) GORE-TEX GRAFT;  Surgeon: Waynetta Sandy, MD;  Location: [facility];  Service: Vascular;  Laterality: Left;   DILATION AND CURETTAGE OF UTERUS     EYE SURGERY     FISTULA SUPERFICIALIZATION Left 09/04/2016   Procedure: FISTULA SUPERFICIALIZATION LEFT UPPER ARM;  Surgeon: Waynetta Sandy, MD;  Location: [facility];  Service: Vascular;  Laterality: Left;   FISTULA SUPERFICIALIZATION Right 10/08/2019   Procedure: FISTULA SUPERFICIALIZATION OR RIGHT  ARM;  Surgeon: Waynetta Sandy, MD;  Location: [facility];  Service: Vascular;  Laterality: Right;   IR AV DIALY SHUNT INTRO NEEDLE/INTRAC INITIAL W/PTA/STENT/IMG LT Left 06/13/2017   IR AV DIALY SHUNT INTRO NEEDLE/INTRAC INITIAL W/PTA/STENT/IMG LT Left 09/12/2017   IR AV DIALY SHUNT INTRO NEEDLE/INTRACATH INITIAL W/PTA/IMG LEFT  11/10/2016   IR AV DIALY SHUNT INTRO NEEDLE/INTRACATH INITIAL W/PTA/IMG LEFT  03/09/2017   IR AV DIALY SHUNT INTRO NEEDLE/INTRACATH INITIAL W/PTA/IMG LEFT  12/12/2017   IR AV DIALY SHUNT INTRO NEEDLE/INTRACATH INITIAL W/PTA/IMG LEFT  02/27/2018   IR AV DIALY SHUNT INTRO NEEDLE/INTRACATH INITIAL W/PTA/IMG LEFT  06/05/2018   IR AV DIALY SHUNT INTRO NEEDLE/INTRACATH INITIAL W/PTA/IMG LEFT  09/11/2018   IR AV DIALY SHUNT INTRO NEEDLE/INTRACATH INITIAL W/PTA/IMG LEFT  12/26/2021   IR AV DIALY SHUNT INTRO NEEDLE/INTRACATH INITIAL W/PTA/IMG RIGHT Right 04/21/2020   IR AV DIALY SHUNT INTRO NEEDLE/INTRACATH INITIAL W/PTA/IMG RIGHT Right 10/08/2020   IR DIALY SHUNT INTRO NEEDLE/INTRACATH INITIAL W/IMG LEFT Left 05/08/2018   IR DIALY SHUNT INTRO NEEDLE/INTRACATH INITIAL W/IMG LEFT Left 06/17/2021   IR FLUORO GUIDE CV LINE RIGHT  02/01/2019   IR FLUORO GUIDE CV LINE RIGHT  07/14/2020   IR FLUORO GUIDE CV LINE RIGHT  12/29/2020   IR GENERIC HISTORICAL  08/01/2016   IR FLUORO GUIDE CV LINE RIGHT 08/01/2016 [name], MD [facility]   IR GENERIC HISTORICAL  08/01/2016   IR US GUIDE VASC ACCESS RIGHT 08/01/2016 [name], MD [facility]   IR REMOVAL TUN CV  CATH W/O FL  11/01/2016   IR REMOVAL TUN CV CATH W/O FL  01/02/2020   IR REMOVAL TUN CV CATH W/O FL  05/04/2021   IR THROMBECTOMY AV FISTULA W/THROMBOLYSIS INC/SHUNT/IMG LEFT Left 01/31/2019   IR THROMBECTOMY AV FISTULA W/THROMBOLYSIS/PTA INC/SHUNT/IMG LEFT Left 01/27/2019   IR THROMBECTOMY AV FISTULA W/THROMBOLYSIS/PTA INC/SHUNT/IMG LEFT Left 07/16/2020   IR THROMBECTOMY AV FISTULA W/THROMBOLYSIS/PTA/STENT INC/SHUNT/IMG RT Right 09/24/2020    IR US GUIDE VASC ACCESS LEFT  11/10/2016   IR US GUIDE VASC ACCESS LEFT  09/12/2017   IR US GUIDE VASC ACCESS LEFT  12/12/2017   IR US GUIDE VASC ACCESS LEFT  06/05/2018   IR US GUIDE VASC ACCESS LEFT  09/11/2018   IR US GUIDE VASC ACCESS LEFT  01/27/2019   IR US GUIDE VASC ACCESS LEFT  02/01/2019   IR US GUIDE VASC ACCESS LEFT  12/26/2021   IR US GUIDE VASC ACCESS RIGHT  02/01/2019   IR US GUIDE VASC ACCESS RIGHT  07/14/2020   IR US GUIDE VASC ACCESS RIGHT  07/16/2020   IR US GUIDE VASC ACCESS RIGHT  10/08/2020   IR US GUIDE VASC ACCESS RIGHT  12/29/2020   PERIPHERAL VASCULAR BALLOON ANGIOPLASTY Left 07/22/2021   Procedure: PERIPHERAL VASCULAR BALLOON ANGIOPLASTY;  Surgeon: Cherre Robins, MD;  Location: [facility];  Service: Cardiovascular;  Laterality: Left;   UPPER EXTREMITY VENOGRAPHY Bilateral 02/28/2021   Procedure: UPPER EXTREMITY VENOGRAPHY;  Surgeon: Waynetta Sandy, MD;  Location: [facility];  Service: Cardiovascular;  Laterality: Bilateral;   HPI:  Pt is a 71 y.o. female who presented secondary to poor p.o. intake. Per H&P, pt "feels like food gets stuck in throat". Suspected to be esophageal and GI consulted, but SLP also consulted for oropharyngeal assessment. BSE (02/19/22) no s/sx of aspiration with solids or liquids. Clear liquids diet initiated per GI recommendation, SLP signed-off at that time. Esophagram (8/23) revealed "Age-related esophageal dysmotility with poor primary contractions an abnormal disordered tertiary contractions with delayed passage of barium. No mass or stricture". LPN note (3/01) states "patient is having difficulty swallowing crushed medication in apple sauce. Patient is screaming not able to be consoled by family". BSE re-ordered. PMH: ESRD on TTS dialysis, HTN, HLD, GERD, DM2 on insulin.    Assessment / Plan / Recommendation  Clinical Impression  Pt seen for bedside swallow eval this am with daughter present at bedside, telephone interpreter  assisting with translation. Per daughter and RN, pt has not been eating/drinking or taking PO meds. Pt alert and upright in bed, with frequent groaning/calling out. Oral mechanism examination limited due to pt's poor ability to follow commands, although no obvious facial asymmetry noted and she presents with some missing upper/lower dentition. Pt with oral defensiveness with cup/spoon to lips for POs and continued moaning. With daughters encouragement, pt consumed sips of thin by cup/straw and small bites of puree, demonstrating oral holding (calling out/moaning with POs) and intermittent overt coughing. NTL trialed by cup, were without incidence concerning for aspiration. With pt calmed, she consumed additional sips of thin by cup with hand over hand assist from clinician without clinical signs of aspiration. Suspect pt's pain/discomfort, which results in inattention to POs, is causing intermittent signs of aspiration with liquids. RN reports palliative being consulted and GOC to be dicussed this date. Recommend continue full liquid diet with 1:1 supervision for small sips by cup when pt is alert, calm and attending to POs. SLP to f/u as indicated per [place].  SLP Visit Diagnosis: Dysphagia, unspecified (R13.10)    Aspiration Risk  Mild aspiration risk;Moderate aspiration risk    Diet Recommendation Thin liquid (full liquids)   Liquid Administration via: Cup;No straw Medication Administration: Via alternative means Supervision: Full supervision/cueing for compensatory strategies Compensations: Slow rate;Small sips/bites;Minimize environmental distractions Postural Changes: Seated upright at 90 degrees    Other  Recommendations Oral Care Recommendations: Oral care BID    Recommendations for follow up therapy are one component of a multi-disciplinary discharge planning process, led by the attending physician.  Recommendations may be updated based on patient status, additional functional criteria and  insurance authorization.  Follow up Recommendations Other (comment) (TBD)      Assistance Recommended at Discharge Frequent or constant Supervision/Assistance  Functional Status Assessment Patient has had a recent decline in their functional status and demonstrates the ability to make significant improvements in function in a reasonable and predictable amount of time.  Frequency and Duration min 2x/week  2 weeks       Prognosis Prognosis for Safe Diet Advancement: Fair Barriers to Reach Goals: Severity of deficits;Cognitive deficits      Swallow Study   General Date of Onset: 02/18/22 HPI: Pt is a 71 y.o. female who presented secondary to poor p.o. intake. Per H&P, pt "feels like food gets stuck in throat". Suspected to be esophageal and GI consulted, but SLP also consulted for oropharyngeal assessment. BSE (02/19/22) no s/sx of aspiration with solids or liquids. Clear liquids diet initiated per GI recommendation, SLP signed-off at that time. Esophagram (8/23) revealed "Age-related esophageal dysmotility with poor primary contractions an abnormal disordered tertiary contractions with delayed passage of barium. No mass or stricture". LPN note (3/78) states "patient is having difficulty swallowing crushed medication in apple sauce. Patient is screaming not able to be consoled by family". BSE re-ordered. PMH: ESRD on TTS dialysis, HTN, HLD, GERD, DM2 on insulin. Type of Study: Bedside Swallow Evaluation Previous Swallow Assessment: see HPI Diet Prior to this Study: Thin liquids (full liquids) Temperature Spikes Noted: No Respiratory Status: Room air History of Recent Intubation: No Behavior/Cognition: Alert;Cooperative;Confused;Requires cueing Oral Cavity Assessment: Excessive secretions Oral Cavity - Dentition: Adequate natural dentition;Missing dentition Vision: Functional for self-feeding Self-Feeding Abilities: Needs assist Patient Positioning: Upright in bed;Postural control  interferes with function Baseline Vocal Quality: Normal Volitional Cough: Weak    Oral/Motor/Sensory Function Overall Oral Motor/Sensory Function: Within functional limits   Ice Chips Ice chips: Impaired Presentation: Spoon Oral Phase Impairments: Reduced labial seal Oral Phase Functional Implications: Oral holding Pharyngeal Phase Impairments: Cough - Delayed   Thin Liquid Thin Liquid: Impaired Presentation: Straw;Cup Oral Phase Functional Implications: Oral holding;Prolonged oral transit Pharyngeal  Phase Impairments: Cough - Immediate;Cough - Delayed    Nectar Thick Nectar Thick Liquid: Impaired Presentation: Cup Oral phase functional implications: Oral holding   Honey Thick Honey Thick Liquid: Not tested   Puree Puree: Impaired Presentation: Spoon Oral Phase Impairments: Reduced lingual movement/coordination Oral Phase Functional Implications: Prolonged oral transit;Oral holding Pharyngeal Phase Impairments: Throat Clearing - Immediate   Solid     Solid: Not tested       Ellwood Dense, [place], [place] Office Number: [phone]  Acie Fredrickson 02/24/2022,10:16 AM

## 2022-02-25 LAB — COMPREHENSIVE METABOLIC PANEL
ALT: 20 U/L (ref 0–44)
AST: 41 U/L (ref 15–41)
Albumin: 2.5 g/dL — ABNORMAL LOW (ref 3.5–5.0)
Alkaline Phosphatase: 110 U/L (ref 38–126)
Anion gap: 12 (ref 5–15)
BUN: 17 mg/dL (ref 8–23)
CO2: 26 mmol/L (ref 22–32)
Calcium: 9.9 mg/dL (ref 8.9–10.3)
Chloride: 101 mmol/L (ref 98–111)
Creatinine, Ser: 5.96 mg/dL — ABNORMAL HIGH (ref 0.44–1.00)
GFR, Estimated: 7 mL/min — ABNORMAL LOW (ref >60)
Glucose, Bld: 112 mg/dL — ABNORMAL HIGH (ref 70–99)
Potassium: 3.1 mmol/L — ABNORMAL LOW (ref 3.5–5.1)
Sodium: 139 mmol/L (ref 135–145)
Total Bilirubin: 0.6 mg/dL (ref 0.3–1.2)
Total Protein: 6.7 g/dL (ref 6.5–8.1)

## 2022-02-25 LAB — CBC WITH DIFFERENTIAL/PLATELET
Abs Immature Granulocytes: 0.03 10*3/uL (ref 0.00–0.07)
Basophils Absolute: 0.1 10*3/uL (ref 0.0–0.1)
Basophils Relative: 1 %
Eosinophils Absolute: 0.2 10*3/uL (ref 0.0–0.5)
Eosinophils Relative: 4 %
HCT: 37.6 % (ref 36.0–46.0)
Hemoglobin: 12.5 g/dL (ref 12.0–15.0)
Immature Granulocytes: 1 %
Lymphocytes Relative: 23 %
Lymphs Abs: 1.1 10*3/uL (ref 0.7–4.0)
MCH: 33 pg (ref 26.0–34.0)
MCHC: 33.2 g/dL (ref 30.0–36.0)
MCV: 99.2 fL (ref 80.0–100.0)
Monocytes Absolute: 0.7 10*3/uL (ref 0.1–1.0)
Monocytes Relative: 14 %
Neutro Abs: 2.7 10*3/uL (ref 1.7–7.7)
Neutrophils Relative %: 57 %
Platelets: 190 10*3/uL (ref 150–400)
RBC: 3.79 MIL/uL — ABNORMAL LOW (ref 3.87–5.11)
RDW: 15.6 % — ABNORMAL HIGH (ref 11.5–15.5)
WBC: 4.8 10*3/uL (ref 4.0–10.5)
nRBC: 0 % (ref 0.0–0.2)

## 2022-02-25 LAB — MAGNESIUM: Magnesium: 1.8 mg/dL (ref 1.7–2.4)

## 2022-02-25 LAB — GLUCOSE, CAPILLARY
Glucose-Capillary: 128 mg/dL — ABNORMAL HIGH (ref 70–99)
Glucose-Capillary: 216 mg/dL — ABNORMAL HIGH (ref 70–99)
Glucose-Capillary: 290 mg/dL — ABNORMAL HIGH (ref 70–99)

## 2022-02-25 LAB — PHOSPHORUS: Phosphorus: 5.5 mg/dL — ABNORMAL HIGH (ref 2.5–4.6)

## 2022-02-25 MED ORDER — OXYCODONE HCL 5 MG PO TABS
2.5000 mg | ORAL_TABLET | Freq: Two times a day (BID) | ORAL | Status: DC
  Administered 2022-02-25 – 2022-02-27 (×3): 2.5 mg via ORAL
  Filled 2022-02-25 (×3): qty 1

## 2022-02-25 MED ORDER — PHENOL 1.4 % MT LIQD
1.0000 | Freq: Once | OROMUCOSAL | Status: AC
  Administered 2022-02-26: 1 via OROMUCOSAL
  Filled 2022-02-25: qty 177

## 2022-02-25 NOTE — Progress Notes (Signed)
Subjective: Seen in dialysis room at end of HD today .  Per HD RN no major problems.  Patient with some abdominal discomfort that continues.  Noted palliative care note this a.m. awaiting further family members coming from out of town  Objective Vital signs in last 24 hours: Vitals:   02/25/22 1030 02/25/22 1100 02/25/22 1130 02/25/22 1236  BP:  106/88 (!) 104/92 (!) 97/47  Pulse:    61  Resp:    18  Temp:    (!) 97.3 F (36.3 C)  TempSrc:    Oral  SpO2: 91% 90% 90% 97%  Weight:    52.2 kg  Height:       Weight change:   Physical Exam: General: Elderly chronically ill female NAD Heart: RRR no MRG Lungs: CTA but decreased respiratory effort nonlabored breathing room air O2 sat 97% Abdomen: NABS, soft, epigastric tenderness no rebound, no ascites, ND Extremities: No pedal edema Dialysis Access: Left upper arm AV Gore-Tex graft positive bruit   Dialysis Orders: [facility]  400/500  52kg  2/2 baht  P2  Heparin 2000 LUA AVG - hep B labs here: pend - last HD 8/19, post wt 53.3kg, last wk edw lowered 3kg - last Hb 11.8 on 8/15, tsat 26%, no esa - iron sucrose IV 100 q hd thru 8/26 - doxercalciferol 5 ug IV tiw     Problem/Plan: Enterocolitis with diarrhea and poor p.o. intake.  Empiric IV Rocephin and Flagyl per admit team, GI panel negative GI consulted ESRD -HD TTS K3.1 likely due to decreased p.o. intake using 4K bath when available follow-up potassium trend HTN/volume -BP stable on HD bilateral pleural effusions, on midodrine UF as tolerated , O2 sat 97 % on room air AMS-chronic probable dementia, now with element of possible med with gabapentin possibly contributing.  Little to no p.o. intake for swallowing thus palliative care seeing with family members .  Noted if she does not progress this weekend may have other plans "/conversation about comfort care " Dysphagia-work-up per admitting Elevated troponin-per cardiology no chest pain or ischemic symptoms follow-up trend no  further work-up Anemia -Hgb 12.5 no ESA noted was getting IV iron through 8/26 Secondary hyperparathyroidism -corrected calcium high holding Hectorol for now, 5.5 phosphorus no binders  Goals of care= palliative seeing patient with her multiple problems dysphagia ongoing abdominal pain advanced dementia= notes if patient has not improved over weekend, having meeting Monday 8/28 at 2:30 PM ="additional conversations about comfort focused care to be done. "   Ernest Haber, PA-C [place] [phone] 02/25/2022,1:02 PM  LOS: 6 days   Labs: Basic Metabolic Panel: Recent Labs  Lab 02/23/22 0705 02/24/22 0148 02/25/22 0206  NA 138 138 139  K 3.3* 3.5 3.1*  CL 96* 99 101  CO2 26 25 26   GLUCOSE 155* 86 112*  BUN 28* 10 17  CREATININE 6.76* 4.21* 5.96*  CALCIUM 9.8 9.5 9.9  PHOS 6.7* 4.1 5.5*   Liver Function Tests: Recent Labs  Lab 02/23/22 0705 02/24/22 0148 02/25/22 0206  AST 35 33 41  ALT 16 19 20   ALKPHOS 110 107 110  BILITOT 0.7 0.4 0.6  PROT 6.7 6.8 6.7  ALBUMIN 2.5* 2.6* 2.5*   Recent Labs  Lab 02/18/22 1442  LIPASE 51   No results for input(s): "AMMONIA" in the last 168 hours. CBC: Recent Labs  Lab 02/21/22 0217 02/22/22 0228 02/23/22 0705 02/24/22 0148 02/25/22 0206  WBC 7.5 6.1 5.2 6.6 4.8  NEUTROABS 5.5 4.2 3.3 4.2 2.7  HGB 11.0* 11.1* 12.5 13.5 12.5  HCT 33.8* 33.6* 38.3 41.7 37.6  MCV 100.0 99.1 100.5* 99.8 99.2  PLT 209 184 195 188 190   Cardiac Enzymes: No results for input(s): "CKTOTAL", "CKMB", "CKMBINDEX", "TROPONINI" in the last 168 hours. CBG: Recent Labs  Lab 02/24/22 1122 02/24/22 1656 02/24/22 1738 02/24/22 2111 02/25/22 0811  GLUCAP 76 60* 157* 119* 128*    Studies/Results: No results found. Medications:  cefTRIAXone (ROCEPHIN)  IV 2 g (02/24/22 2232)   dextrose 30 mL/hr at 02/24/22 1716   metronidazole 500 mg (02/24/22 2114)    (feeding supplement) PROSource Plus  30 mL Oral BID BM   Chlorhexidine  Gluconate Cloth  6 each Topical Q0600   feeding supplement  1 Container Oral TID BM   heparin  5,000 Units Subcutaneous Q8H   insulin aspart  0-6 Units Subcutaneous TID WC   midodrine  5 mg Oral TID WC   multivitamin  1 tablet Oral QHS   oxyCODONE  2.5 mg Oral BID   polyethylene glycol  17 g Oral Daily   thiamine (VITAMIN B1) injection  100 mg Intravenous Daily

## 2022-02-25 NOTE — Progress Notes (Signed)
Received patient in bed to unit.  Alert and oriented.  Informed consent signed and in chart.   Treatment initiated: 0930 Treatment completed: 1235  Patient tolerated well.  Transported back to the room  Alert, without acute distress.  Hand-off given to patient's nurse.   Access used: AVgraft Access issues: none  Total UF removed: 900 Medication(s) given: midodrine Post HD VS: 97.3,97/47,18,97% Post HD weight: 52.2kg   [facility]

## 2022-02-25 NOTE — Progress Notes (Signed)
Palliative Medicine Inpatient Follow Up Note HPI: Sarah Kelley is a 71 y.o. female with medical history significant of ESRD on TTS dialysis, last had dialysis earlier today.  HTN, HLD, DM2 on insulin. Pt presents to ED with 2 week h/o reduced PO intake, diaphoresis over past 3-4 days and profuse diarrhea - GI consulted stool studies (-) though (+) colitis on BS antibiotics. (+) Dysphagia.    Palliative care has been asked to get involved to further address goals of care in the setting of overalll decline and failure to thrive.   Today's Discussion 02/25/2022  *Please note that this is a verbal dictation therefore any spelling or grammatical errors are due to the "[place] [employer]" system interpretation.  Chart reviewed inclusive of vital signs, progress notes, laboratory results, and diagnostic images. Per MAR review fentanyl was given only once overnight.  Assessed Sarah Kelley - she was resting comfortably this morning in bed - appeared to be in no distress.  I met with patients two granddaughters this morning, Sarah Kelley and Sarah Kelley. They share that patient has not slept well overnight and had ongoing abdominal pain. This morning around 6AM she did receive some medicine which per grandchildren helped quite a bit allowing her to fall asleep. I shared that I would order a medication standing to address patients pain more comprehensively.   WE further reviewed that patient remains to be eating nothing. Discussed that family is for the most part in agreement with possibly stopping dialysis in the near future though patients son, Sarah Kelley who is also her primary caregiver is having a tough time.  Created space and opportunity for grandchildren to explore thoughts feelings and fears regarding current medical situation.They share that two additional family members are flying into town which will hopefully help in terms of additional conversations.   Questions and concerns addressed/Palliative  Support Provided.   Objective Assessment: Vital Signs Vitals:   02/25/22 0824 02/25/22 0927  BP: (!) 120/36 (!) 112/48  Pulse: 64 61  Resp: 15 17  Temp: 98.6 F (37 C) 98.1 F (36.7 C)  SpO2: 98% 98%    Intake/Output Summary (Last 24 hours) at 02/25/2022 8768 Last data filed at 02/24/2022 1835 Gross per 24 hour  Intake 139.32 ml  Output --  Net 139.32 ml   Last Weight  Most recent update: 02/23/2022  6:37 PM    Weight  55.8 kg (123 lb 0.3 oz)            Gen: Elderly Hispanic female in no acute distress HEENT: moist mucous membranes CV: Regular rate and rhythm PULM: On room air even and nonlabored ABD: soft/nontender/nondistended EXT: No edema Neuro: Alert and oriented to self  SUMMARY OF RECOMMENDATIONS   DNAR/DNI   Continue current care inclusive of hemodialysis   Plan for family meeting Monday at 55 -->if patient has not improved by then additional conversations will be had pertaining to comfort focused care   Patient's abdominal pain is significant --> additional medications have been ordered as below the risks and benefits of these medications have been discussed with patient's family   Ongoing palliative care support  Symptom Management:  Abdominal Pain: - Oxycodone 2.52m PO BID - Oxycodone 567mPO Q4H PRN - Fentanyl 2519mQ2H PRN severe pain  Billing based on MDM: High  Problems Addressed: One acute or chronic illness or injury that poses a threat to life or bodily function  Amount and/or Complexity of Data: Category 3:Discussion of management or test interpretation with external physician/other  qualified health care professional/appropriate source (not separately reported)  Risks: Parenteral controlled substances, Decision regarding hospitalization or escalation of hospital care, and Decision not to resuscitate or to de-escalate care because of poor  prognosis ______________________________________________________________________________________ [facility] Team Team Cell Phone: [phone] Please utilize secure chat with additional questions, if there is no response within 30 minutes please call the above phone number  Palliative Medicine Team providers are available by phone from 7am to 7pm daily and can be reached through the team cell phone.  Should this patient require assistance outside of these hours, please call the patient's attending physician.

## 2022-02-25 NOTE — Progress Notes (Signed)
PROGRESS NOTE    Sarah Kelley  ZDG:[id number] DOB: [birth year] DOA: 02/18/2022 PCP: [name], MD  Chief Complaint  Patient presents with   Failure To Thrive    Brief Narrative:  HPI per Dr. Johnathan Hausen Vandermeulen is Sarah Kelley 71 y.o. female with medical history significant of ESRD on TTS dialysis, last had dialysis earlier today.  HTN, HLD, DM2 on insulin.   Pt presents to ED with 2 week h/o reduced PO intake, diaphoresis over past 3-4 days and profuse diarrhea.   Per PT: feels like food gets stuck in throat.   No CP, SOB.   No sick contacts in family, no one else in family is sick.    Assessment & Plan:   Principal Problem:   Enterocolitis Active Problems:   Dysphagia   Goals of care, counseling/discussion   ESRD (end stage renal disease) (HCC)   Elevated troponin   Essential hypertension   Type 2 diabetes mellitus with chronic kidney disease on chronic dialysis, with long-term current use of insulin (HCC)   Hypothyroidism   Bilateral pleural effusion   Pain   Dementia without behavioral disturbance (HCC)   Hypokalemia   Malnutrition of moderate degree   Assessment and Plan: * Enterocolitis Presumed infectious initially on admission, with frequent diarrhea and decreased PO intake. GI pathogen pnl negative. C.Diff neg CT abdomen and pelvis concerning for colitis.  Continue empiric IV Rocephin and Flagyl No meaningful improvement, could consider repeat imaging, will discuss with family given ongoing goals of care See ESRD below regarding fluid status GI following and appreciate their input and recommendations.  Goals of care, counseling/discussion Will c/s palliative care to help start [place] conversations Appreciate assistance, she's been transitioned to DNR/DNI.  Will have another meeting Monday.    Dysphagia Probably the underlying cause of reduced PO intake, even more so than the GI illness. Fluid filled esophagus on CT noted  Esophagram  limited due to patient's inability to follow commands, notable for age related esophageal dysmotility with poor primary contractions and abnormal disordered tertiary contractions with delayed passage of barium  GI consulted, recommend adjusting diet to foods/beverages she tolerates SLP recommending thin liquids  Minimal documented PO intake  Elevated troponin Cards consulted on patient They indicate that this is likely demand ischemia Troponin is elevated but seem to be flattened.  Patient with doubt any chest pain. 2D echo from 02/13/2018 with EF of 55 to 95%,JOAC, grade 1 diastolic dysfunction. Repeat 2D echo with EF of 16%,SAYT, grade 1 diastolic dysfunction, normal right ventricular systolic function, mild to moderate MVR, no evidence of mitral stenosis. Patient seen in consultation by cardiology who reviewed 2D echo and EKG and will not pursue any further cardiac evaluation at this time, cardiology feels troponins flat not consistent with ACS and also feel patient is not an ideal candidate for invasive work-up.  ESRD (end stage renal disease) ([place]) Appears to be possibly volume overloaded with mod B pleural effusions on CT scan despite recent GI illness with diarrhea, poor PO intake, and dialysis on 02/18/2022 prior to presentation in the ED. Nephrology following and patient currently in HD Volume management per renal   Hypothyroidism TSH wnl with elevated free T4, unclear significance  Will need outpatient follow up of thyroid function tests Looks like "thyroid disease" is on her [place] list, but I dont see where shes on any sort of thyroid hormone replacement. Likely needs outpatient follow-up.   Type 2 diabetes mellitus with chronic kidney disease on chronic dialysis, with  long-term current use of insulin (HCC) -Hemoglobin A1c 7.7  (02/18/2022 ). -Continue CBGs to before meals and at bedtime.   - hypoglycemia 8/25, hold basal, continue very sensitive SSI, will continue to monitor, has  d10 ordered for now  Essential hypertension Soft BPs. -Continue to hold BP medications. -Patient started on midodrine.  Pain Unclear cause, see below Delirium? Fentanyl prn ordered Will continue to monitor  Bilateral pleural effusion Repeat CXR 8/24 with mild bilateral effusion, increased mild central pulm vascular congestion Volume per renal   Hypokalemia - Likely in part secondary to GI losses. -Defer repletion to nephrology as patient ESRD on HD.  Dementia without behavioral disturbance ([place]) - Stable. -Patient with Sarah Kelley severe dementia, monitor for now. -delirium precautions - seems to be shouting out in pain, uncomfortable, but no corresponding exam findings noted to explain this (she notes pain in her but per son, exam notable for mild redness, abrasions, her abdomen is soft).  Will continue to monitor, recommend delirium precautions at this time.  Additional w/u as indicated.           DVT prophylaxis: heparin Code Status: full Family Communication: son over phone Disposition:   Status is: Inpatient Remains inpatient appropriate because: need for continued management   Consultants:  Renal GI  Procedures:  none  Antimicrobials:  Anti-infectives (From admission, onward)    Start     Dose/Rate Route Frequency Ordered Stop   02/18/22 2230  cefTRIAXone (ROCEPHIN) 2 g in sodium chloride 0.9 % 100 mL IVPB        2 g 200 mL/hr over 30 Minutes Intravenous Every 24 hours 02/18/22 2217 02/27/22 2229   02/18/22 2230  metroNIDAZOLE (FLAGYL) IVPB 500 mg        500 mg 100 mL/hr over 60 Minutes Intravenous Every 12 hours 02/18/22 2217         Subjective: Doesn't speak when I see her during dialysis  Son said she took some liquids without vomiting - also ate jello   Objective: Vitals:   02/25/22 1100 02/25/22 1130 02/25/22 1236 02/25/22 1414  BP: 106/88 (!) 104/92 (!) 97/47 (!) 114/37  Pulse:   61 73  Resp:   18   Temp:   (!) 97.3 F (36.3 C) 98 F (36.7  C)  TempSrc:   Oral Oral  SpO2: 90% 90% 97% 96%  Weight:   52.2 kg   Height:        Intake/Output Summary (Last 24 hours) at 02/25/2022 1903 Last data filed at 02/25/2022 1236 Gross per 24 hour  Intake --  Output 900 ml  Net -900 ml   Filed Weights   02/23/22 1500 02/23/22 1837 02/25/22 1236  Weight: 56.4 kg 55.8 kg 52.2 kg    Examination:  General: No acute distress. Cardiovascular: RRR Lungs: unlabored Abdomen: mild diffuse abdominal pain Neurological: continued confusion, moving all extremities Extremities: No clubbing or cyanosis. No edema.  Data Reviewed: I have personally reviewed following labs and imaging studies  CBC: Recent Labs  Lab 02/21/22 0217 02/22/22 0228 02/23/22 0705 02/24/22 0148 02/25/22 0206  WBC 7.5 6.1 5.2 6.6 4.8  NEUTROABS 5.5 4.2 3.3 4.2 2.7  HGB 11.0* 11.1* 12.5 13.5 12.5  HCT 33.8* 33.6* 38.3 41.7 37.6  MCV 100.0 99.1 100.5* 99.8 99.2  PLT 209 184 195 188 621    Basic Metabolic Panel: Recent Labs  Lab 02/20/22 1045 02/21/22 0217 02/22/22 0228 02/23/22 0705 02/24/22 0148 02/25/22 0206  NA 136 136 136 138 138 139  K 3.1* 3.0* 3.2* 3.3* 3.5 3.1*  CL 97* 97* 98 96* 99 101  CO2 24 25 28 26 25 26   GLUCOSE 123* 206* 88 155* 86 112*  BUN 35* 39* 18 28* 10 17  CREATININE 5.93* 6.82* 4.64* 6.76* 4.21* 5.96*  CALCIUM 9.4 9.1 9.7 9.8 9.5 9.9  MG 1.9 2.0  --  1.8 1.8 1.8  PHOS 6.0* 6.3* 5.3* 6.7* 4.1 5.5*    GFR: Estimated Creatinine Clearance: 6.1 mL/min (Sarah Kelley) (by C-G formula based on SCr of 5.96 mg/dL (H)).  Liver Function Tests: Recent Labs  Lab 02/19/22 1157 02/20/22 1045 02/21/22 0217 02/22/22 0228 02/23/22 0705 02/24/22 0148 02/25/22 0206  AST 38  --   --   --  35 33 41  ALT 20  --   --   --  16 19 20   ALKPHOS 126  --   --   --  110 107 110  BILITOT 0.9  --   --   --  0.7 0.4 0.6  PROT 6.1*  --   --   --  6.7 6.8 6.7  ALBUMIN 2.3*   < > 2.5* 2.3* 2.5* 2.6* 2.5*   < > = values in this interval not displayed.     CBG: Recent Labs  Lab 02/24/22 1656 02/24/22 1738 02/24/22 2111 02/25/22 0811 02/25/22 1713  GLUCAP 60* 157* 119* 128* 216*     Recent Results (from the past 240 hour(s))  C Difficile Quick Screen w PCR reflex     Status: None   Collection Time: 02/18/22  5:41 PM   Specimen: STOOL  Result Value Ref Range Status   C Diff antigen NEGATIVE NEGATIVE Final   C Diff toxin NEGATIVE NEGATIVE Final   C Diff interpretation No C. difficile detected.  Final    Comment: Performed at [facility], [place] [address] [place], [place] [address]  Gastrointestinal Panel by PCR , Stool     Status: None   Collection Time: 02/18/22  5:42 PM   Specimen: STOOL  Result Value Ref Range Status   Campylobacter species NOT DETECTED NOT DETECTED Final   Plesimonas shigelloides NOT DETECTED NOT DETECTED Final   Salmonella species NOT DETECTED NOT DETECTED Final   Yersinia enterocolitica NOT DETECTED NOT DETECTED Final   Vibrio species NOT DETECTED NOT DETECTED Final   Vibrio cholerae NOT DETECTED NOT DETECTED Final   Enteroaggregative E coli (EAEC) NOT DETECTED NOT DETECTED Final   Enteropathogenic E coli (EPEC) NOT DETECTED NOT DETECTED Final   Enterotoxigenic E coli (ETEC) NOT DETECTED NOT DETECTED Final   Shiga like toxin producing E coli (STEC) NOT DETECTED NOT DETECTED Final   Shigella/Enteroinvasive E coli (EIEC) NOT DETECTED NOT DETECTED Final   Cryptosporidium NOT DETECTED NOT DETECTED Final   Cyclospora cayetanensis NOT DETECTED NOT DETECTED Final   Entamoeba histolytica NOT DETECTED NOT DETECTED Final   Giardia lamblia NOT DETECTED NOT DETECTED Final   Adenovirus F40/41 NOT DETECTED NOT DETECTED Final   Astrovirus NOT DETECTED NOT DETECTED Final   Norovirus GI/GII NOT DETECTED NOT DETECTED Final   Rotavirus Sarah Kelley NOT DETECTED NOT DETECTED Final   Sapovirus (I, II, IV, and V) NOT DETECTED NOT DETECTED Final    Comment: Performed at [facility], [address],  [place], [place] [address]         Radiology Studies: No results found.      Scheduled Meds:  (feeding supplement) PROSource Plus  30 mL Oral BID BM  Chlorhexidine Gluconate Cloth  6 each Topical Q0600   feeding supplement  1 Container Oral TID BM   heparin  5,000 Units Subcutaneous Q8H   insulin aspart  0-6 Units Subcutaneous TID WC   midodrine  5 mg Oral TID WC   multivitamin  1 tablet Oral QHS   oxyCODONE  2.5 mg Oral BID   polyethylene glycol  17 g Oral Daily   thiamine (VITAMIN B1) injection  100 mg Intravenous Daily   Continuous Infusions:  cefTRIAXone (ROCEPHIN)  IV 2 g (02/24/22 2232)   dextrose 30 mL/hr at 02/24/22 1716   metronidazole 500 mg (02/24/22 2114)     LOS: 6 days    Time spent: over 30 min    Fayrene Helper, MD [facility]   To contact the attending provider between 7A-7P or the covering provider during after hours 7P-7A, please log into the web site [email or web address] and access using universal [place] password for that web site. If you do not have the password, please call the hospital operator.  02/25/2022, 7:03 PM

## 2022-02-26 ENCOUNTER — Inpatient Hospital Stay (HOSPITAL_COMMUNITY): Payer: Medicaid Other

## 2022-02-26 LAB — CBC
HCT: 38.8 % (ref 36.0–46.0)
Hemoglobin: 12.6 g/dL (ref 12.0–15.0)
MCH: 32.7 pg (ref 26.0–34.0)
MCHC: 32.5 g/dL (ref 30.0–36.0)
MCV: 100.8 fL — ABNORMAL HIGH (ref 80.0–100.0)
Platelets: 155 10*3/uL (ref 150–400)
RBC: 3.85 MIL/uL — ABNORMAL LOW (ref 3.87–5.11)
RDW: 15.9 % — ABNORMAL HIGH (ref 11.5–15.5)
WBC: 5.2 10*3/uL (ref 4.0–10.5)
nRBC: 0 % (ref 0.0–0.2)

## 2022-02-26 LAB — COMPREHENSIVE METABOLIC PANEL
ALT: 20 U/L (ref 0–44)
AST: 46 U/L — ABNORMAL HIGH (ref 15–41)
Albumin: 2.4 g/dL — ABNORMAL LOW (ref 3.5–5.0)
Alkaline Phosphatase: 117 U/L (ref 38–126)
Anion gap: 11 (ref 5–15)
BUN: 13 mg/dL (ref 8–23)
CO2: 26 mmol/L (ref 22–32)
Calcium: 9.9 mg/dL (ref 8.9–10.3)
Chloride: 97 mmol/L — ABNORMAL LOW (ref 98–111)
Creatinine, Ser: 4.87 mg/dL — ABNORMAL HIGH (ref 0.44–1.00)
GFR, Estimated: 9 mL/min — ABNORMAL LOW (ref >60)
Glucose, Bld: 233 mg/dL — ABNORMAL HIGH (ref 70–99)
Potassium: 3.6 mmol/L (ref 3.5–5.1)
Sodium: 134 mmol/L — ABNORMAL LOW (ref 135–145)
Total Bilirubin: 0.3 mg/dL (ref 0.3–1.2)
Total Protein: 6.1 g/dL — ABNORMAL LOW (ref 6.5–8.1)

## 2022-02-26 LAB — GLUCOSE, CAPILLARY
Glucose-Capillary: 222 mg/dL — ABNORMAL HIGH (ref 70–99)
Glucose-Capillary: 168 mg/dL — ABNORMAL HIGH (ref 70–99)
Glucose-Capillary: 228 mg/dL — ABNORMAL HIGH (ref 70–99)
Glucose-Capillary: 183 mg/dL — ABNORMAL HIGH (ref 70–99)

## 2022-02-26 NOTE — Progress Notes (Signed)
Subjective: Seen in room with family present, has complaints of back pain abdominal discomfort.  No chest pain or shortness of breath.  HD yesterday on schedule I liter UF some soft blood pressures after but appeared to have tolerated.   Objective Vital signs in last 24 hours: Vitals:   02/26/22 0100 02/26/22 0500 02/26/22 0529 02/26/22 0811  BP:   (!) 82/70 (!) 93/27  Pulse:      Resp: 20 18 18 17   Temp:   97.6 F (36.4 C) (!) 97.4 F (36.3 C)  TempSrc:   Axillary Axillary  SpO2:      Weight:      Height:       Weight change:     Physical Exam: General: Elderly chronically ill female NAD Heart: RRR no MRG Lungs: CTA but decreased respiratory effort nonlabored breathing room air  Abdomen: NABS, soft, epigastric tenderness no rebound, no ascites, ND Extremities: No pedal edema Dialysis Access: Left upper arm AV Gore-Tex graft positive bruit   Dialysis Orders: [facility] 4h  400/500  52kg  2/2 baht  P2  Heparin 2000 LUA AVG - hep B labs here: pend - last HD 8/19, post wt 53.3kg, last wk edw lowered 3kg - last Hb 11.8 on 8/15, tsat 26%, no esa - iron sucrose IV 100 q hd thru 8/26 - doxercalciferol 5 ug IV tiw     Problem/Plan: Enterocolitis with diarrhea and poor p.o. intake.  Empiric IV Rocephin and Flagyl per admit team, GI panel negative GI consulted, little to no n.p.o. intake ESRD -HD TTS HD, on schedule, A.M. LAB K3.6 HTN/volume -BP stable on HD bilateral pleural effusions, on midodrine 1 L yesterday UF tolerated , does not appear volume overloaded with minimal p.o. intake.  Last O2 sat 97 % on room air yesterday none today AMS-chronic probable dementia, now with element of possible med with gabapentin possibly contributing.  Little to no p.o. intake, palliative care seeing per notes= noted if she does not progress this weekend may have other plans "/conversation about comfort care " Dysphagia-work-up per admitting Elevated troponin-per cardiology no chest pain or  ischemic symptoms follow-up trend no further work-up Anemia -Hgb 12.5 no ESA noted was getting IV iron through 8/26 Secondary hyperparathyroidism -corrected calcium high holding Hectorol for now, 5.5 phosphorus no binders  FTT /Goals of care= palliative seeing patient with her multiple problems dysphagia ongoing abdominal pain advanced dementia= notes if patient has not improved over weekend, having meeting Monday 8/28 at 2:30 PM ="additional conversations about comfort focused care to be done. "  Discussed with  family today HD will not help her FTT issues it just keeps her stable for now.  Next HD Tuesday 8/29 if they want to continue.  They could not give me an answer currently about stopping dialysis.  Ernest Haber, PA-C [facility] [phone] 02/26/2022,2:43 PM  LOS: 7 days   Labs: Basic Metabolic Panel: Recent Labs  Lab 02/23/22 0705 02/24/22 0148 02/25/22 0206 02/26/22 0925  NA 138 138 139 134*  K 3.3* 3.5 3.1* 3.6  CL 96* 99 101 97*  CO2 26 25 26 26   GLUCOSE 155* 86 112* 233*  BUN 28* 10 17 13   CREATININE 6.76* 4.21* 5.96* 4.87*  CALCIUM 9.8 9.5 9.9 9.9  PHOS 6.7* 4.1 5.5*  --    Liver Function Tests: Recent Labs  Lab 02/24/22 0148 02/25/22 0206 02/26/22 0925  AST 33 41 46*  ALT 19 20 20   ALKPHOS 107 110  117  BILITOT 0.4 0.6 0.3  PROT 6.8 6.7 6.1*  ALBUMIN 2.6* 2.5* 2.4*   No results for input(s): "LIPASE", "AMYLASE" in the last 168 hours. No results for input(s): "AMMONIA" in the last 168 hours. CBC: Recent Labs  Lab 02/22/22 0228 02/23/22 0705 02/24/22 0148 02/25/22 0206 02/26/22 0925  WBC 6.1 5.2 6.6 4.8 5.2  NEUTROABS 4.2 3.3 4.2 2.7  --   HGB 11.1* 12.5 13.5 12.5 12.6  HCT 33.6* 38.3 41.7 37.6 38.8  MCV 99.1 100.5* 99.8 99.2 100.8*  PLT 184 195 188 190 155   Cardiac Enzymes: No results for input(s): "CKTOTAL", "CKMB", "CKMBINDEX", "TROPONINI" in the last 168 hours. CBG: Recent Labs  Lab 02/25/22 0811 02/25/22 1713  02/25/22 2020 02/26/22 0854 02/26/22 1150  GLUCAP 128* 216* 290* 222* 168*    Studies/Results: No results found. Medications:  cefTRIAXone (ROCEPHIN)  IV 2 g (02/25/22 2328)   dextrose 30 mL/hr at 02/26/22 0726   metronidazole 500 mg (02/26/22 0935)    (feeding supplement) PROSource Plus  30 mL Oral BID BM   Chlorhexidine Gluconate Cloth  6 each Topical Q0600   feeding supplement  1 Container Oral TID BM   heparin  5,000 Units Subcutaneous Q8H   insulin aspart  0-6 Units Subcutaneous TID WC   midodrine  5 mg Oral TID WC   multivitamin  1 tablet Oral QHS   oxyCODONE  2.5 mg Oral BID   polyethylene glycol  17 g Oral Daily   thiamine (VITAMIN B1) injection  100 mg Intravenous Daily

## 2022-02-26 NOTE — Progress Notes (Signed)
PROGRESS NOTE    Sarah Kelley  PJK:[id number] DOB: [birth year] DOA: 02/18/2022 PCP: [name], MD  Chief Complaint  Patient presents with   Failure To Thrive    Brief Narrative:  HPI per Dr. Johnathan Hausen Mcgilvery is Sarah Kelley 71 y.o. female with medical history significant of ESRD on TTS dialysis, last had dialysis earlier today.  HTN, HLD, DM2 on insulin.   Pt presents to ED with 2 week h/o reduced PO intake, diaphoresis over past 3-4 days and profuse diarrhea.   Per PT: feels like food gets stuck in throat.   No CP, SOB.   No sick contacts in family, no one else in family is sick.    Assessment & Plan:   Principal Problem:   Enterocolitis Active Problems:   Dysphagia   Goals of care, counseling/discussion   ESRD (end stage renal disease) (HCC)   Elevated troponin   Essential hypertension   Type 2 diabetes mellitus with chronic kidney disease on chronic dialysis, with long-term current use of insulin (HCC)   Hypothyroidism   Bilateral pleural effusion   Pain   Dementia without behavioral disturbance (HCC)   Hypokalemia   Malnutrition of moderate degree   Assessment and Plan: * Enterocolitis Presumed infectious initially on admission, with frequent diarrhea and decreased PO intake. GI pathogen pnl negative. C.Diff neg CT abdomen and pelvis concerning for colitis.  Continue empiric IV Rocephin and Flagyl No meaningful improvement, will repeat CT abd/pelvis today  See ESRD below regarding fluid status GI following and appreciate their input and recommendations.  Goals of care, counseling/discussion Will c/s palliative care to help start [place] conversations Appreciate assistance, she's been transitioned to DNR/DNI.  Will have another meeting Monday.    Dysphagia Probably the underlying cause of reduced PO intake, even more so than the GI illness. Fluid filled esophagus on CT noted  Esophagram limited due to patient's inability to follow commands,  notable for age related esophageal dysmotility with poor primary contractions and abnormal disordered tertiary contractions with delayed passage of barium  GI consulted, recommend adjusting diet to foods/beverages she tolerates SLP recommending thin liquids  Minimal documented PO intake  Elevated troponin Cards consulted on patient They indicate that this is likely demand ischemia Troponin is elevated but seem to be flattened.  Patient with doubt any chest pain. 2D echo from 02/13/2018 with EF of 55 to 80%,DXIP, grade 1 diastolic dysfunction. Repeat 2D echo with EF of 38%,SNKN, grade 1 diastolic dysfunction, normal right ventricular systolic function, mild to moderate MVR, no evidence of mitral stenosis. Patient seen in consultation by cardiology who reviewed 2D echo and EKG and will not pursue any further cardiac evaluation at this time, cardiology feels troponins flat not consistent with ACS and also feel patient is not an ideal candidate for invasive work-up.  ESRD (end stage renal disease) ([place]) Appears to be possibly volume overloaded with mod B pleural effusions on CT scan despite recent GI illness with diarrhea, poor PO intake, and dialysis on 02/18/2022 prior to presentation in the ED. Nephrology following and patient currently in HD Volume management per renal   Hypothyroidism TSH wnl with elevated free T4, unclear significance  Will need outpatient follow up of thyroid function tests Looks like "thyroid disease" is on her [place] list, but I dont see where shes on any sort of thyroid hormone replacement. Likely needs outpatient follow-up.   Type 2 diabetes mellitus with chronic kidney disease on chronic dialysis, with long-term current use of insulin (HCC) -Hemoglobin  A1c 7.7  (02/18/2022 ). -Continue CBGs to before meals and at bedtime.   - hypoglycemia 8/25, hold basal, continue very sensitive SSI, will continue to monitor, has d10 ordered for now  Essential hypertension -lower  Bp's in 80's - 90's noted today -> related to pain meds? -Continue to hold BP medications. -continue midodrine  Pain Unclear cause, see below Delirium? Fentanyl prn ordered Will continue to monitor  Bilateral pleural effusion Repeat CXR 8/24 with mild bilateral effusion, increased mild central pulm vascular congestion Volume per renal   Hypokalemia - Likely in part secondary to GI losses. -Defer repletion to nephrology as patient ESRD on HD.  Dementia without behavioral disturbance ([place]) - Stable. -Patient with Sarah Kelley severe dementia, monitor for now. -delirium precautions - seems to be shouting out in pain, uncomfortable, but no corresponding exam findings noted to explain this (she notes pain in her but per son, exam notable for mild redness, abrasions, her abdomen is soft).  Will continue to monitor, recommend delirium precautions at this time.  Additional w/u as indicated.           DVT prophylaxis: heparin Code Status: full Family Communication: son over phone Disposition:   Status is: Inpatient Remains inpatient appropriate because: need for continued management   Consultants:  Renal GI  Procedures:  none  Antimicrobials:  Anti-infectives (From admission, onward)    Start     Dose/Rate Route Frequency Ordered Stop   02/18/22 2230  cefTRIAXone (ROCEPHIN) 2 g in sodium chloride 0.9 % 100 mL IVPB        2 g 200 mL/hr over 30 Minutes Intravenous Every 24 hours 02/18/22 2217 02/27/22 2229   02/18/22 2230  metroNIDAZOLE (FLAGYL) IVPB 500 mg        500 mg 100 mL/hr over 60 Minutes Intravenous Every 12 hours 02/18/22 2217         Subjective: Discussed with son with interpreter He thinks she's about the same  Objective: Vitals:   02/26/22 0500 02/26/22 0529 02/26/22 0811 02/26/22 1400  BP:  (!) 82/70 (!) 93/27 (!) 122/33  Pulse:      Resp: 18 18 17 20   Temp:  97.6 F (36.4 C) (!) 97.4 F (36.3 C)   TempSrc:  Axillary Axillary   SpO2:      Weight:       Height:        Intake/Output Summary (Last 24 hours) at 02/26/2022 1550 Last data filed at 02/26/2022 1500 Gross per 24 hour  Intake 250 ml  Output --  Net 250 ml   Filed Weights   02/23/22 1500 02/23/22 1837 02/25/22 1236  Weight: 56.4 kg 55.8 kg 52.2 kg    Examination:  General: No acute distress. Cardiovascular: RRR Lungs: unlabored Abdomen: mild diffuse abdominal discomfort Neurological: Alert and oriented 3. Moves all extremities 4 with equal strength. Cranial nerves II through XII grossly intact. Extremities: No clubbing or cyanosis. No edema. Data Reviewed: I have personally reviewed following labs and imaging studies  CBC: Recent Labs  Lab 02/21/22 0217 02/22/22 0228 02/23/22 0705 02/24/22 0148 02/25/22 0206 02/26/22 0925  WBC 7.5 6.1 5.2 6.6 4.8 5.2  NEUTROABS 5.5 4.2 3.3 4.2 2.7  --   HGB 11.0* 11.1* 12.5 13.5 12.5 12.6  HCT 33.8* 33.6* 38.3 41.7 37.6 38.8  MCV 100.0 99.1 100.5* 99.8 99.2 100.8*  PLT 209 184 195 188 190 831    Basic Metabolic Panel: Recent Labs  Lab 02/20/22 1045 02/21/22 0217 02/22/22 0228 02/23/22 5176  02/24/22 0148 02/25/22 0206 02/26/22 0925  NA 136 136 136 138 138 139 134*  K 3.1* 3.0* 3.2* 3.3* 3.5 3.1* 3.6  CL 97* 97* 98 96* 99 101 97*  CO2 24 25 28 26 25 26 26   GLUCOSE 123* 206* 88 155* 86 112* 233*  BUN 35* 39* 18 28* 10 17 13   CREATININE 5.93* 6.82* 4.64* 6.76* 4.21* 5.96* 4.87*  CALCIUM 9.4 9.1 9.7 9.8 9.5 9.9 9.9  MG 1.9 2.0  --  1.8 1.8 1.8  --   PHOS 6.0* 6.3* 5.3* 6.7* 4.1 5.5*  --     GFR: Estimated Creatinine Clearance: 7.5 mL/min (Debria Broecker) (by C-G formula based on SCr of 4.87 mg/dL (H)).  Liver Function Tests: Recent Labs  Lab 02/22/22 0228 02/23/22 0705 02/24/22 0148 02/25/22 0206 02/26/22 0925  AST  --  35 33 41 46*  ALT  --  16 19 20 20   ALKPHOS  --  110 107 110 117  BILITOT  --  0.7 0.4 0.6 0.3  PROT  --  6.7 6.8 6.7 6.1*  ALBUMIN 2.3* 2.5* 2.6* 2.5* 2.4*    CBG: Recent Labs  Lab  02/25/22 0811 02/25/22 1713 02/25/22 2020 02/26/22 0854 02/26/22 1150  GLUCAP 128* 216* 290* 222* 168*     Recent Results (from the past 240 hour(s))  C Difficile Quick Screen w PCR reflex     Status: None   Collection Time: 02/18/22  5:41 PM   Specimen: STOOL  Result Value Ref Range Status   C Diff antigen NEGATIVE NEGATIVE Final   C Diff toxin NEGATIVE NEGATIVE Final   C Diff interpretation No C. difficile detected.  Final    Comment: Performed at [facility], [place] [address] [place], [place] [address]  Gastrointestinal Panel by PCR , Stool     Status: None   Collection Time: 02/18/22  5:42 PM   Specimen: STOOL  Result Value Ref Range Status   Campylobacter species NOT DETECTED NOT DETECTED Final   Plesimonas shigelloides NOT DETECTED NOT DETECTED Final   Salmonella species NOT DETECTED NOT DETECTED Final   Yersinia enterocolitica NOT DETECTED NOT DETECTED Final   Vibrio species NOT DETECTED NOT DETECTED Final   Vibrio cholerae NOT DETECTED NOT DETECTED Final   Enteroaggregative E coli (EAEC) NOT DETECTED NOT DETECTED Final   Enteropathogenic E coli (EPEC) NOT DETECTED NOT DETECTED Final   Enterotoxigenic E coli (ETEC) NOT DETECTED NOT DETECTED Final   Shiga like toxin producing E coli (STEC) NOT DETECTED NOT DETECTED Final   Shigella/Enteroinvasive E coli (EIEC) NOT DETECTED NOT DETECTED Final   Cryptosporidium NOT DETECTED NOT DETECTED Final   Cyclospora cayetanensis NOT DETECTED NOT DETECTED Final   Entamoeba histolytica NOT DETECTED NOT DETECTED Final   Giardia lamblia NOT DETECTED NOT DETECTED Final   Adenovirus F40/41 NOT DETECTED NOT DETECTED Final   Astrovirus NOT DETECTED NOT DETECTED Final   Norovirus GI/GII NOT DETECTED NOT DETECTED Final   Rotavirus Alvina Strother NOT DETECTED NOT DETECTED Final   Sapovirus (I, II, IV, and V) NOT DETECTED NOT DETECTED Final    Comment: Performed at [facility], [address], [place], [place] [address]          Radiology Studies: No results found.      Scheduled Meds:  (feeding supplement) PROSource Plus  30 mL Oral BID BM   Chlorhexidine Gluconate Cloth  6 each Topical Q0600   feeding supplement  1 Container Oral TID BM  heparin  5,000 Units Subcutaneous Q8H   insulin aspart  0-6 Units Subcutaneous TID WC   midodrine  5 mg Oral TID WC   multivitamin  1 tablet Oral QHS   oxyCODONE  2.5 mg Oral BID   polyethylene glycol  17 g Oral Daily   thiamine (VITAMIN B1) injection  100 mg Intravenous Daily   Continuous Infusions:  cefTRIAXone (ROCEPHIN)  IV 2 g (02/25/22 2328)   dextrose 30 mL/hr at 02/26/22 0726   metronidazole 500 mg (02/26/22 0935)     LOS: 7 days    Time spent: over 30 min    Fayrene Helper, MD [facility]   To contact the attending provider between 7A-7P or the covering provider during after hours 7P-7A, please log into the web site [email or web address] and access using universal  password for that web site. If you do not have the password, please call the hospital operator.  02/26/2022, 3:50 PM

## 2022-02-26 NOTE — Progress Notes (Signed)
Palliative Medicine Inpatient Follow Up Note HPI: Sarah Kelley is a 71 y.o. female with medical history significant of ESRD on TTS dialysis, last had dialysis earlier today.  HTN, HLD, DM2 on insulin. Pt presents to ED with 2 week h/o reduced PO intake, diaphoresis over past 3-4 days and profuse diarrhea - GI consulted stool studies (-) though (+) colitis on BS antibiotics. (+) Dysphagia.    Palliative care has been asked to get involved to further address goals of care in the setting of overalll decline and failure to thrive.   Today's Discussion 02/26/2022  *Please note that this is a verbal dictation therefore any spelling or grammatical errors are due to the "[place] [employer]" system interpretation.  Chart reviewed inclusive of vital signs, progress notes, laboratory results, and diagnostic images. Per MAR review fentanyl was only given once yesterday AM. She has maintained with oral oxycodone.  I met at bedside with [facility] this morning, she was sleeping.  Per patients grandson, Aron Baba ([place] son) who was present throughout the evening [name] was resting well until about 5AM. She received some medication and fell asleep shortly thereafter. He shares that he has helped and been present for many of the patients hospitalizations as well as her ongoing iHD treatments. He shares that family is coming today. Per Aron Baba he feels most of the family will agree with [name] being made comfortable though his uncle [name] is going to have a hard time with it.   I shared that it is up to the nephrologist if iHD cannot be offered any longer due to patient present state. If so, that may make the conversation easier per grandson as he feels this will be a tough conversation. I shared that I can get additional insights from the nephrologist.   Otherwise plan will be for a family meeting tomorrow for continued discussion of where to go from this point on given patients lack of clinical  improvement with BS antibiotics over the past week. Also, ongoing FTT.   Questions and concerns addressed/Palliative Support Provided.   Objective Assessment: Vital Signs Vitals:   02/26/22 0529 02/26/22 0811  BP: (!) 82/70 (!) 93/27  Pulse:    Resp: 18 17  Temp: 97.6 F (36.4 C) (!) 97.4 F (36.3 C)  SpO2:      Intake/Output Summary (Last 24 hours) at 02/26/2022 1135 Last data filed at 02/26/2022 6962 Gross per 24 hour  Intake 200 ml  Output 900 ml  Net -700 ml    Last Weight  Most recent update: 02/25/2022 12:49 PM    Weight  52.2 kg (115 lb 1.3 oz)            Gen: Elderly Hispanic female in no acute distress HEENT: moist mucous membranes CV: Regular rate and rhythm PULM: On room air even and nonlabored ABD: soft/nontender/nondistended EXT: No edema Neuro: Alert and oriented to self  SUMMARY OF RECOMMENDATIONS   DNAR/DNI   Continue current care inclusive of hemodialysis   Plan for family meeting tomorrow at 44 -->if patient has not improved by then additional conversations will be had pertaining to comfort focused care   Patient's abdominal pain is significant --> Continue present medications as below   Ongoing palliative care support --> I will not be present tomorrow though a team member will follow up  Symptom Management:  Abdominal Pain: - Oxycodone 2.47m PO BID - Oxycodone 586mPO Q4H PRN - Fentanyl 253mQ2H PRN severe pain  Billing based on MDM: High  Problems Addressed: One acute or chronic illness or injury that poses a threat to life or bodily function  Amount and/or Complexity of Data: Category 3:Discussion of management or test interpretation with external physician/other qualified health care professional/appropriate source (not separately reported)  Risks: Parenteral controlled substances, Decision regarding hospitalization or escalation of hospital care, and Decision not to resuscitate or to de-escalate care because of poor  prognosis ______________________________________________________________________________________ [place] Team Team Cell Phone: [phone] Please utilize secure chat with additional questions, if there is no response within 30 minutes please call the above phone number  Palliative Medicine Team providers are available by phone from 7am to 7pm daily and can be reached through the team cell phone.  Should this patient require assistance outside of these hours, please call the patient's attending physician.

## 2022-02-27 DIAGNOSIS — R627 Adult failure to thrive: Secondary | ICD-10-CM

## 2022-02-27 LAB — CBC WITH DIFFERENTIAL/PLATELET
Abs Immature Granulocytes: 0.06 10*3/uL (ref 0.00–0.07)
Basophils Absolute: 0.1 10*3/uL (ref 0.0–0.1)
Basophils Relative: 1 %
Eosinophils Absolute: 0.2 10*3/uL (ref 0.0–0.5)
Eosinophils Relative: 3 %
HCT: 42.4 % (ref 36.0–46.0)
Hemoglobin: 13.5 g/dL (ref 12.0–15.0)
Immature Granulocytes: 1 %
Lymphocytes Relative: 23 %
Lymphs Abs: 1.2 10*3/uL (ref 0.7–4.0)
MCH: 32.2 pg (ref 26.0–34.0)
MCHC: 31.8 g/dL (ref 30.0–36.0)
MCV: 101.2 fL — ABNORMAL HIGH (ref 80.0–100.0)
Monocytes Absolute: 0.8 10*3/uL (ref 0.1–1.0)
Monocytes Relative: 15 %
Neutro Abs: 3 10*3/uL (ref 1.7–7.7)
Neutrophils Relative %: 57 %
Platelets: 118 10*3/uL — ABNORMAL LOW (ref 150–400)
RBC: 4.19 MIL/uL (ref 3.87–5.11)
RDW: 16 % — ABNORMAL HIGH (ref 11.5–15.5)
WBC: 5.2 10*3/uL (ref 4.0–10.5)
nRBC: 0 % (ref 0.0–0.2)

## 2022-02-27 LAB — COMPREHENSIVE METABOLIC PANEL
ALT: 18 U/L (ref 0–44)
AST: 40 U/L (ref 15–41)
Albumin: 2.4 g/dL — ABNORMAL LOW (ref 3.5–5.0)
Alkaline Phosphatase: 106 U/L (ref 38–126)
Anion gap: 11 (ref 5–15)
BUN: 15 mg/dL (ref 8–23)
CO2: 30 mmol/L (ref 22–32)
Calcium: 10.2 mg/dL (ref 8.9–10.3)
Chloride: 95 mmol/L — ABNORMAL LOW (ref 98–111)
Creatinine, Ser: 6.03 mg/dL — ABNORMAL HIGH (ref 0.44–1.00)
GFR, Estimated: 7 mL/min — ABNORMAL LOW (ref >60)
Glucose, Bld: 188 mg/dL — ABNORMAL HIGH (ref 70–99)
Potassium: 3.2 mmol/L — ABNORMAL LOW (ref 3.5–5.1)
Sodium: 136 mmol/L (ref 135–145)
Total Bilirubin: 0.4 mg/dL (ref 0.3–1.2)
Total Protein: 6.5 g/dL (ref 6.5–8.1)

## 2022-02-27 LAB — PHOSPHORUS: Phosphorus: 5.7 mg/dL — ABNORMAL HIGH (ref 2.5–4.6)

## 2022-02-27 LAB — MAGNESIUM: Magnesium: 1.8 mg/dL (ref 1.7–2.4)

## 2022-02-27 LAB — GLUCOSE, CAPILLARY
Glucose-Capillary: 215 mg/dL — ABNORMAL HIGH (ref 70–99)
Glucose-Capillary: 169 mg/dL — ABNORMAL HIGH (ref 70–99)
Glucose-Capillary: 229 mg/dL — ABNORMAL HIGH (ref 70–99)
Glucose-Capillary: 244 mg/dL — ABNORMAL HIGH (ref 70–99)

## 2022-02-27 MED ORDER — OXYCODONE HCL 20 MG/ML PO CONC: 5.0000 mg | Freq: Three times a day (TID) | ORAL | Status: DC

## 2022-02-27 MED ORDER — OXYCODONE HCL 5 MG/5ML PO SOLN
5.0000 mg | Freq: Three times a day (TID) | ORAL | Status: DC
  Filled 2022-02-27: qty 5

## 2022-02-27 MED ORDER — SODIUM CHLORIDE 0.9 % IV SOLN
2.0000 g | INTRAVENOUS | Status: AC
  Administered 2022-02-27: 2 g via INTRAVENOUS
  Filled 2022-02-27: qty 20

## 2022-02-27 MED ORDER — ALBUMIN HUMAN 25 % IV SOLN
25.0000 g | Freq: Once | INTRAVENOUS | Status: AC
Start: 2022-02-27 — End: 2022-03-01
  Administered 2022-02-27: 25 g via INTRAVENOUS
  Filled 2022-02-27: qty 100

## 2022-02-27 NOTE — Progress Notes (Signed)
HOSPITAL MEDICINE OVERNIGHT EVENT NOTE    Nursing reports that she has witnessed the patient gagging and coughing with any attempted oral intake.  Nursing reports that she feels it is unsafe to provide patient with any further oral intake at this time.  Chart reviewed, patient has already been suffering from dysphagia and speech therapy has been following.  Speech therapy has previously stated that the patient is able to take some oral intake with a full liquid diet.    Per my review of the daytime team's notes patient's overall prognosis is poor however the patient is not currently receiving comfort measures.    We will therefore make patient n.p.o. for now until day team and speech therapy can reevaluate or there is a change in goals of care.    Sarah Emerald  MD [facility]

## 2022-02-27 NOTE — Inpatient Diabetes Management (Signed)
Inpatient Diabetes Program Recommendations  AACE/ADA: New Consensus Statement on Inpatient Glycemic Control   Target Ranges:  Prepandial:   less than 140 mg/dL      Peak postprandial:   less than 180 mg/dL (1-2 hours)      Critically ill patients:  140 - 180 mg/dL    Latest Reference Range & Units 02/27/22 08:07 02/27/22 11:28  Glucose-Capillary 70 - 99 mg/dL 215 (H) 169 (H)    Latest Reference Range & Units 02/26/22 08:54 02/26/22 11:50 02/26/22 18:24 02/26/22 20:53  Glucose-Capillary 70 - 99 mg/dL 222 (H) 168 (H) 228 (H) 183 (H)   Review of Glycemic Control  Diabetes history: DM2 Outpatient Diabetes medications: Lantus 14 units QAM, Lantus 6 units QPM Current orders for Inpatient glycemic control: Novolog 0-6 units TID with meals  Inpatient Diabetes Program Recommendations:    Insulin: Please consider ordering Semglee 3 units Q24H.  Thanks, Barnie Alderman, RN, MSN, [place] Diabetes Coordinator Inpatient Diabetes Program [phone] (Team Pager from 8am to [place])

## 2022-02-27 NOTE — Progress Notes (Signed)
PROGRESS NOTE    Sarah Kelley  ZOX:[id number] DOB: 23-Jul-1950 DOA: 02/18/2022 PCP: [name], MD  Chief Complaint  Patient presents with   Failure To Thrive    Brief Narrative:  HPI per Dr. Johnathan Hausen Coltrin is Sarah Kelley 71 y.o. female with medical history significant of ESRD on TTS dialysis, last had dialysis earlier today.  HTN, HLD, DM2 on insulin.   Pt presents to ED with 2 week h/o reduced PO intake, diaphoresis over past 3-4 days and profuse diarrhea.   Per PT: feels like food gets stuck in throat.   No CP, SOB.   No sick contacts in family, no one else in family is sick.    Assessment & Plan:   Principal Problem:   Enterocolitis Active Problems:   Dysphagia   Goals of care, counseling/discussion   ESRD (end stage renal disease) (HCC)   Elevated troponin   Essential hypertension   Type 2 diabetes mellitus with chronic kidney disease on chronic dialysis, with long-term current use of insulin (HCC)   Hypothyroidism   Bilateral pleural effusion   Pain   Dementia without behavioral disturbance (HCC)   Hypokalemia   Malnutrition of moderate degree   Assessment and Plan: * Enterocolitis Presumed infectious initially on admission, with frequent diarrhea and decreased PO intake. GI pathogen pnl negative. C.Diff neg CT abdomen and pelvis concerning for colitis.  Continue empiric IV Rocephin and Flagyl See ESRD below regarding fluid status GI following and appreciate their input and recommendations.  Pericolonic edema could be related to overload?  No recommendations for any GI procedures. Repeat CT C/Aidan Moten/P with findings concerning for layering small cbd stones, mild diffuse colonic wall thickening, moderate R and small L effusions -> bili wnl, LFT's wnl, don't think MRCP appropriate at this time  Goals of care, counseling/discussion Will c/s palliative care to help start [place] conversations Appreciate assistance, she's been transitioned to DNR/DNI.   At this time, awaiting further family arrival prior to additional decisions.  Due to her lack of improvement, we've recommended comfort measures, family coming to terms with this and not quite ready.  Dysphagia Probably the underlying cause of reduced PO intake, even more so than the GI illness. Fluid filled esophagus on CT noted  Esophagram limited due to patient's inability to follow commands, notable for age related esophageal dysmotility with poor primary contractions and abnormal disordered tertiary contractions with delayed passage of barium  GI consulted, recommend adjusting diet to foods/beverages she tolerates SLP recommending thin liquids  Minimal documented PO intake  Elevated troponin Cards consulted on patient They indicate that this is likely demand ischemia Troponin is elevated but seem to be flattened.  Patient with doubt any chest pain. 2D echo from 02/13/2018 with EF of 55 to 81%,XBJY, grade 1 diastolic dysfunction. Repeat 2D echo with EF of 78%,GNFA, grade 1 diastolic dysfunction, normal right ventricular systolic function, mild to moderate MVR, no evidence of mitral stenosis. Patient seen in consultation by cardiology who reviewed 2D echo and EKG and will not pursue any further cardiac evaluation at this time, cardiology feels troponins flat not consistent with ACS and also feel patient is not an ideal candidate for invasive work-up.  ESRD (end stage renal disease) ([place]) Appears to be possibly volume overloaded with mod B pleural effusions on CT scan despite recent GI illness with diarrhea, poor PO intake, and dialysis on 02/18/2022 prior to presentation in the ED. Nephrology following and patient currently in HD Volume management per renal   Hypothyroidism  TSH wnl with elevated free T4, unclear significance  Will need outpatient follow up of thyroid function tests Looks like "thyroid disease" is on her [place] list, but I dont see where shes on any sort of thyroid hormone  replacement. Likely needs outpatient follow-up.   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 -Continue CBGs to before meals and at bedtime.   - hypoglycemia 8/25, hold basal, continue very sensitive SSI, will continue to monitor, has d10 ordered for now  Essential hypertension -fluctuating bP, intermittent hypotension -Continue to hold BP medications. -continue midodrine  Pain Unclear cause, see below Delirium? Fentanyl prn ordered Appreciate palliative care assistance with pain management Will continue to monitor  Bilateral pleural effusion Repeat CXR 8/24 with mild bilateral effusion, increased mild central pulm vascular congestion CT with moderate R and small L effusions on 8/27 -> suspected initially due to volume, though renal doesn't think she's overloaded Discussed with family, I don't think thoracentesis for definitive diagnosis likely to meaningfully change outcome Volume per renal   Hypokalemia - Likely in part secondary to GI losses. -Defer repletion to nephrology as patient ESRD on HD.  Dementia without behavioral disturbance ([place]) - Stable. -Patient with Lashauna Arpin severe dementia, monitor for now. -delirium precautions - seems to be shouting out in pain, uncomfortable, but no corresponding exam findings noted to explain this (she notes pain in her but per son, exam notable for mild redness, abrasions, her abdomen is soft).  Will continue to monitor, recommend delirium precautions at this time.  Additional w/u as indicated.           DVT prophylaxis: heparin Code Status: full Family Communication: family meeting with palliative Disposition:   Status is: Inpatient Remains inpatient appropriate because: need for continued management   Consultants:  Renal GI  Procedures:  none  Antimicrobials:  Anti-infectives (From admission, onward)    Start      Dose/Rate Route Frequency Ordered Stop   02/27/22 2200  cefTRIAXone (ROCEPHIN) 2 g in sodium chloride 0.9 % 100 mL IVPB        2 g 200 mL/hr over 30 Minutes Intravenous Every 24 hours 02/27/22 1814 02/28/22 2159   02/18/22 2230  cefTRIAXone (ROCEPHIN) 2 g in sodium chloride 0.9 % 100 mL IVPB  Status:  Discontinued        2 g 200 mL/hr over 30 Minutes Intravenous Every 24 hours 02/18/22 2217 02/27/22 1814   02/18/22 2230  metroNIDAZOLE (FLAGYL) IVPB 500 mg        500 mg 100 mL/hr over 60 Minutes Intravenous Every 12 hours 02/18/22 2217         Subjective: Discussed with son and family with interpreter She hasn't done well with PO recently  Objective: Vitals:   02/27/22 0215 02/27/22 0458 02/27/22 0812 02/27/22 1228  BP: (!) 78/50   (!) 123/49  Pulse:  (!) 57  67  Resp: 14 17  17   Temp:  (!) 97 F (36.1 C) 98 F (36.7 C) 98 F (36.7 C)  TempSrc:  Oral Oral   SpO2:  95%    Weight:      Height:        Intake/Output Summary (Last 24 hours) at 02/27/2022 1822 Last data filed at 02/27/2022 1600 Gross per 24 hour  Intake 2375.58 ml  Output --  Net 2375.58 ml   Filed Weights   02/23/22 1500 02/23/22 1837 02/25/22  1236  Weight: 56.4 kg 55.8 kg 52.2 kg    Examination:  General: moans at times, reaches towards me and son Cardiovascular: RRR Lungs: unlabored Abdomen: Soft, nontender, nondistended  Neurological: encephalopathic, moving all extremities Extremities: No clubbing or cyanosis. No edema.   Data Reviewed: I have personally reviewed following labs and imaging studies  CBC: Recent Labs  Lab 02/22/22 0228 02/23/22 0705 02/24/22 0148 02/25/22 0206 02/26/22 0925 02/27/22 0214  WBC 6.1 5.2 6.6 4.8 5.2 5.2  NEUTROABS 4.2 3.3 4.2 2.7  --  3.0  HGB 11.1* 12.5 13.5 12.5 12.6 13.5  HCT 33.6* 38.3 41.7 37.6 38.8 42.4  MCV 99.1 100.5* 99.8 99.2 100.8* 101.2*  PLT 184 195 188 190 155 118*    Basic Metabolic Panel: Recent Labs  Lab 02/21/22 0217 02/22/22 0228  02/23/22 0705 02/24/22 0148 02/25/22 0206 02/26/22 0925 02/27/22 0453  NA 136 136 138 138 139 134* 136  K 3.0* 3.2* 3.3* 3.5 3.1* 3.6 3.2*  CL 97* 98 96* 99 101 97* 95*  CO2 25 28 26 25 26 26 30   GLUCOSE 206* 88 155* 86 112* 233* 188*  BUN 39* 18 28* 10 17 13 15   CREATININE 6.82* 4.64* 6.76* 4.21* 5.96* 4.87* 6.03*  CALCIUM 9.1 9.7 9.8 9.5 9.9 9.9 10.2  MG 2.0  --  1.8 1.8 1.8  --  1.8  PHOS 6.3* 5.3* 6.7* 4.1 5.5*  --  5.7*    GFR: Estimated Creatinine Clearance: 6.1 mL/min (Doralee Kocak) (by C-G formula based on SCr of 6.03 mg/dL (H)).  Liver Function Tests: Recent Labs  Lab 02/23/22 0705 02/24/22 0148 02/25/22 0206 02/26/22 0925 02/27/22 0453  AST 35 33 41 46* 40  ALT 16 19 20 20 18   ALKPHOS 110 107 110 117 106  BILITOT 0.7 0.4 0.6 0.3 0.4  PROT 6.7 6.8 6.7 6.1* 6.5  ALBUMIN 2.5* 2.6* 2.5* 2.4* 2.4*    CBG: Recent Labs  Lab 02/26/22 1824 02/26/22 2053 02/27/22 0807 02/27/22 1128 02/27/22 1607  GLUCAP 228* 183* 215* 169* 229*     Recent Results (from the past 240 hour(s))  C Difficile Quick Screen w PCR reflex     Status: None   Collection Time: 02/18/22  5:41 PM   Specimen: STOOL  Result Value Ref Range Status   C Diff antigen NEGATIVE NEGATIVE Final   C Diff toxin NEGATIVE NEGATIVE Final   C Diff interpretation No C. difficile detected.  Final    Comment: Performed at [facility], [place] [address], [place],  [address]  Gastrointestinal Panel by PCR , Stool     Status: None   Collection Time: 02/18/22  5:42 PM   Specimen: STOOL  Result Value Ref Range Status   Campylobacter species NOT DETECTED NOT DETECTED Final   Plesimonas shigelloides NOT DETECTED NOT DETECTED Final   Salmonella species NOT DETECTED NOT DETECTED Final   Yersinia enterocolitica NOT DETECTED NOT DETECTED Final   Vibrio species NOT DETECTED NOT DETECTED Final   Vibrio cholerae NOT DETECTED NOT DETECTED Final   Enteroaggregative E coli (EAEC) NOT DETECTED NOT DETECTED Final    Enteropathogenic E coli (EPEC) NOT DETECTED NOT DETECTED Final   Enterotoxigenic E coli (ETEC) NOT DETECTED NOT DETECTED Final   Shiga like toxin producing E coli (STEC) NOT DETECTED NOT DETECTED Final   Shigella/Enteroinvasive E coli (EIEC) NOT DETECTED NOT DETECTED Final   Cryptosporidium NOT DETECTED NOT DETECTED Final   Cyclospora cayetanensis NOT DETECTED NOT DETECTED Final  Entamoeba histolytica NOT DETECTED NOT DETECTED Final   Giardia lamblia NOT DETECTED NOT DETECTED Final   Adenovirus F40/41 NOT DETECTED NOT DETECTED Final   Astrovirus NOT DETECTED NOT DETECTED Final   Norovirus GI/GII NOT DETECTED NOT DETECTED Final   Rotavirus Ziare Cryder NOT DETECTED NOT DETECTED Final   Sapovirus (I, II, IV, and V) NOT DETECTED NOT DETECTED Final    Comment: Performed at [facility], [address], [place], [place] [address]         Radiology Studies: CT CHEST ABDOMEN PELVIS WO CONTRAST  Result Date: 02/26/2022 CLINICAL DATA:  Failure to thrive, diarrhea EXAM: CT CHEST, ABDOMEN AND PELVIS WITHOUT CONTRAST TECHNIQUE: Multidetector CT imaging of the chest, abdomen and pelvis was performed following the standard protocol without IV contrast. RADIATION DOSE REDUCTION: This exam was performed according to the departmental dose-optimization program which includes automated exposure control, adjustment of the mA and/or kV according to patient size and/or use of iterative reconstruction technique. COMPARISON:  CT chest, abdomen and pelvis dated February 18, 2022 FINDINGS: CT CHEST FINDINGS Cardiovascular: Normal heart size. Pericardial effusion. Severe left main and three-vessel coronary artery calcifications. Mitral annular calcifications. Normal caliber thoracic aorta with severe calcified plaque. Mediastinum/Nodes: Patulous esophagus. Thyroid is unremarkable. Mildly enlarged calcified mediastinal lymph nodes, likely sequela of prior granulomatous infection,, reference periaortic lymph node measuring  1.0 cm in short axis on series 3, image 18, unchanged when compared to prior exam. Lungs/Pleura: Central airways are patent. Moderate right and small left pleural effusions, unchanged when compared with prior exam. Bibasilar atelectasis. No evidence of pneumothorax. Musculoskeletal: No chest wall mass or suspicious bone lesions identified. CT ABDOMEN PELVIS FINDINGS Hepatobiliary: No focal liver abnormality. Gallstones with no gallbladder wall thickening. Unremarkable. No biliary ductal dilation. Linear hyperdense material seen in the distal common bile duct on series 3, image 60 and coronal series 6, image 54. Pancreas: Unremarkable. No pancreatic ductal dilatation or surrounding inflammatory changes. Spleen: Normal in size without focal abnormality. Adrenals/Urinary Tract: Bilateral adrenal glands are unremarkable. Atrophic bilateral kidneys with no hydronephrosis. Thick-walled urinary bladder, unchanged when compared to prior. Stomach/Bowel: Stomach is within normal limits. Appendix appears normal. Mild diffuse colonic wall thickening. No evidence of obstruction or inflammatory change. Vascular/Lymphatic: Aortic atherosclerosis. No enlarged abdominal or pelvic lymph nodes. Reproductive: Calcifications of the uterus.  No adnexal masses. Other: Small fat containing ventral abdominal wall hernias. Abdominopelvic ascites. Soft tissue anasarca. Musculoskeletal: No acute or significant osseous findings. IMPRESSION: 1. Linear hyperdensity of the distal common bile duct bile duct, concerning for layering small common bile duct stones. Could be further evaluated with MRCP. 2. Mild diffuse colonic wall thickening, similar to prior, can be seen in the setting of colitis. 3. Thick-walled urinary bladder, unchanged when compared to prior. 4. Unchanged moderate right and small left pleural effusions with adjacent atelectasis. 5. Aortic Atherosclerosis (ICD10-I70.0). Electronically Signed   By: Yetta Glassman M.D.   On:  02/26/2022 19:42        Scheduled Meds:  (feeding supplement) PROSource Plus  30 mL Oral BID BM   Chlorhexidine Gluconate Cloth  6 each Topical Q0600   feeding supplement  1 Container Oral TID BM   heparin  5,000 Units Subcutaneous Q8H   insulin aspart  0-6 Units Subcutaneous TID WC   midodrine  5 mg Oral TID WC   multivitamin  1 tablet Oral QHS   oxyCODONE  5 mg Oral TID   polyethylene glycol  17 g Oral Daily   thiamine (VITAMIN  B1) injection  100 mg Intravenous Daily   Continuous Infusions:  cefTRIAXone (ROCEPHIN)  IV     dextrose 30 mL/hr at 02/27/22 2130   metronidazole 500 mg (02/27/22 0904)     LOS: 8 days    Time spent: over 30 min    Fayrene Helper, MD [facility]   To contact the attending provider between 7A-7P or the covering provider during after hours 7P-7A, please log into the web site [email or web address] and access using universal [place] password for that web site. If you do not have the password, please call the hospital operator.  02/27/2022, 6:22 PM

## 2022-02-27 NOTE — Progress Notes (Signed)
Dialysis Orders: [facility] 4h  400/500  52kg  2/2 baht  P2  Heparin 2000 LUA AVG - hep B labs here: pend - last HD 8/19, post wt 53.3kg, last wk edw lowered 3kg - last Hb 11.8 on 8/15, tsat 26%, no esa - iron sucrose IV 100 q hd thru 8/26 - doxercalciferol 5 ug IV tiw   Problem/Plan: Enterocolitis with diarrhea and poor p.o. intake.  Empiric IV Rocephin and Flagyl per admit team, GI panel negative GI consulted, little to no n.p.o. intake ESRD -HD TTS HD, on schedule, had discussion with son who was bedside and there is a family meeting today as well with other siblings. This son would like to stop dialysis which I would recommend as well. The dialysis is really not improving QOL and is an aggressive measure almost certainly making things more difficult for the pt and family (ie transport, being on hd, not feeling well after hd). Will plan on hd 2nd shift in case pt decides to stop hd.  HTN/volume -BP stable on HD bilateral pleural effusions, on midodrine 1 L yesterday UF tolerated , does not appear volume overloaded with minimal p.o. intake.  Last O2 sat 97 % on room air yesterday none today AMS-chronic probable dementia, now with element of possible med with gabapentin possibly contributing.  Little to no p.o. intake, palliative care seeing per notes= another meeting today. Would advise comfort measures. Son is trying but pt does not want to eat. Dysphagia-work-up per admitting Elevated troponin-per cardiology no chest pain or ischemic symptoms follow-up trend no further work-up Anemia -Hgb 12.5 no ESA noted was getting IV iron through 8/26 Secondary hyperparathyroidism -corrected calcium high holding Hectorol for now, 5.5 phosphorus no binders  FTT /Goals of care= palliative seeing patient with her multiple problems dysphagia ongoing abdominal pain advanced dementia= notes if patient has not improved over weekend, having meeting Monday 8/28 at 2:30 PM ="additional conversations about comfort  focused care to be done. "  Subjective: Seen in room with son present, has complaints of back pain abdominal discomfort.  No chest pain or shortness of breath.    Objective Vital signs in last 24 hours: Vitals:   02/26/22 2217 02/27/22 0215 02/27/22 0458 02/27/22 0812  BP: 107/86 (!) 78/50    Pulse: (!) 52  (!) 57   Resp: 19 14 17    Temp: 97.6 F (36.4 C)  (!) 97 F (36.1 C) 98 F (36.7 C)  TempSrc: Oral  Oral Oral  SpO2: 90%  95%   Weight:      Height:       Weight change:     Physical Exam: General: Elderly chronically ill female NAD Heart: RRR no MRG Lungs: CTA but decreased respiratory effort nonlabored breathing room air  Abdomen: NABS, soft, epigastric tenderness no rebound, no ascites, ND Extremities: No pedal edema Dialysis Access: Left upper arm AV Gore-Tex graft positive bruit     Labs: Basic Metabolic Panel: Recent Labs  Lab 02/24/22 0148 02/25/22 0206 02/26/22 0925 02/27/22 0453  NA 138 139 134* 136  K 3.5 3.1* 3.6 3.2*  CL 99 101 97* 95*  CO2 25 26 26 30   GLUCOSE 86 112* 233* 188*  BUN 10 17 13 15   CREATININE 4.21* 5.96* 4.87* 6.03*  CALCIUM 9.5 9.9 9.9 10.2  PHOS 4.1 5.5*  --  5.7*   Liver Function Tests: Recent Labs  Lab 02/25/22 0206 02/26/22 0925 02/27/22 0453  AST 41 46* 40  ALT 20 20  18  ALKPHOS 110 117 106  BILITOT 0.6 0.3 0.4  PROT 6.7 6.1* 6.5  ALBUMIN 2.5* 2.4* 2.4*   No results for input(s): "LIPASE", "AMYLASE" in the last 168 hours. No results for input(s): "AMMONIA" in the last 168 hours. CBC: Recent Labs  Lab 02/23/22 0705 02/24/22 0148 02/25/22 0206 02/26/22 0925 02/27/22 0214  WBC 5.2 6.6 4.8 5.2 5.2  NEUTROABS 3.3 4.2 2.7  --  3.0  HGB 12.5 13.5 12.5 12.6 13.5  HCT 38.3 41.7 37.6 38.8 42.4  MCV 100.5* 99.8 99.2 100.8* 101.2*  PLT 195 188 190 155 118*   Cardiac Enzymes: No results for input(s): "CKTOTAL", "CKMB", "CKMBINDEX", "TROPONINI" in the last 168 hours. CBG: Recent Labs  Lab 02/26/22 0854  02/26/22 1150 02/26/22 1824 02/26/22 2053 02/27/22 0807  GLUCAP 222* 168* 228* 183* 215*    Studies/Results: CT CHEST ABDOMEN PELVIS WO CONTRAST  Result Date: 02/26/2022 CLINICAL DATA:  Failure to thrive, diarrhea EXAM: CT CHEST, ABDOMEN AND PELVIS WITHOUT CONTRAST TECHNIQUE: Multidetector CT imaging of the chest, abdomen and pelvis was performed following the standard protocol without IV contrast. RADIATION DOSE REDUCTION: This exam was performed according to the departmental dose-optimization program which includes automated exposure control, adjustment of the mA and/or kV according to patient size and/or use of iterative reconstruction technique. COMPARISON:  CT chest, abdomen and pelvis dated February 18, 2022 FINDINGS: CT CHEST FINDINGS Cardiovascular: Normal heart size. Pericardial effusion. Severe left main and three-vessel coronary artery calcifications. Mitral annular calcifications. Normal caliber thoracic aorta with severe calcified plaque. Mediastinum/Nodes: Patulous esophagus. Thyroid is unremarkable. Mildly enlarged calcified mediastinal lymph nodes, likely sequela of prior granulomatous infection,, reference periaortic lymph node measuring 1.0 cm in short axis on series 3, image 18, unchanged when compared to prior exam. Lungs/Pleura: Central airways are patent. Moderate right and small left pleural effusions, unchanged when compared with prior exam. Bibasilar atelectasis. No evidence of pneumothorax. Musculoskeletal: No chest wall mass or suspicious bone lesions identified. CT ABDOMEN PELVIS FINDINGS Hepatobiliary: No focal liver abnormality. Gallstones with no gallbladder wall thickening. Unremarkable. No biliary ductal dilation. Linear hyperdense material seen in the distal common bile duct on series 3, image 60 and coronal series 6, image 54. Pancreas: Unremarkable. No pancreatic ductal dilatation or surrounding inflammatory changes. Spleen: Normal in size without focal abnormality.  Adrenals/Urinary Tract: Bilateral adrenal glands are unremarkable. Atrophic bilateral kidneys with no hydronephrosis. Thick-walled urinary bladder, unchanged when compared to prior. Stomach/Bowel: Stomach is within normal limits. Appendix appears normal. Mild diffuse colonic wall thickening. No evidence of obstruction or inflammatory change. Vascular/Lymphatic: Aortic atherosclerosis. No enlarged abdominal or pelvic lymph nodes. Reproductive: Calcifications of the uterus.  No adnexal masses. Other: Small fat containing ventral abdominal wall hernias. Abdominopelvic ascites. Soft tissue anasarca. Musculoskeletal: No acute or significant osseous findings. IMPRESSION: 1. Linear hyperdensity of the distal common bile duct bile duct, concerning for layering small common bile duct stones. Could be further evaluated with MRCP. 2. Mild diffuse colonic wall thickening, similar to prior, can be seen in the setting of colitis. 3. Thick-walled urinary bladder, unchanged when compared to prior. 4. Unchanged moderate right and small left pleural effusions with adjacent atelectasis. 5. Aortic Atherosclerosis (ICD10-I70.0). Electronically Signed   By: Yetta Glassman M.D.   On: 02/26/2022 19:42   Medications:  cefTRIAXone (ROCEPHIN)  IV 2 g (02/26/22 2334)   dextrose 30 mL/hr at 02/27/22 0633   metronidazole 500 mg (02/27/22 0904)    (feeding supplement) PROSource Plus  30 mL Oral BID BM  Chlorhexidine Gluconate Cloth  6 each Topical Q0600   feeding supplement  1 Container Oral TID BM   heparin  5,000 Units Subcutaneous Q8H   insulin aspart  0-6 Units Subcutaneous TID WC   midodrine  5 mg Oral TID WC   multivitamin  1 tablet Oral QHS   oxyCODONE  2.5 mg Oral BID   polyethylene glycol  17 g Oral Daily   thiamine (VITAMIN B1) injection  100 mg Intravenous Daily

## 2022-02-27 NOTE — Progress Notes (Addendum)
Palliative:  HPI: 71 y.o. 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 Pt presents to ED with 2 week h/o reduced PO intake, diaphoresis over past 3-4 days and profuse diarrhea - GI consulted stool studies (-) though (+) colitis on BS antibiotics. (+) Dysphagia. Palliative care has been asked to get involved to further address goals of care in the setting of overalll decline and failure to thrive.   I met today initially at Lalita's bedside after reviewing records. Discussed with Spero Geralds, [place] Interpreter and Dr. Florene Glen. I met with Dr. Florene Glen, Spero Geralds, and multiple children and grandchildren. We discussed in full Ariel's poor prognosis and that her body is shutting down as she is slowly dying. We discussed various reasons for her decline and poor intake. We discussed quality vs quantity at length. We discussed that she is a poor candidate to continue dialysis and that she is unlikely to physically be capable of completing dialysis very soon. We discussed recommendations for comfort care and consideration of hospice if they wish to take her home. Family had many good questions that were answered to the best of our ability. Some family are distressed that Jisel is suffering and her pain is uncontrolled. We discussed at length side of effects and goals of comfort. All family agree that we should treat her pain as needed and this is priority over hypotension or prolonging life. Some family are very much struggling with stopping dialysis although they do recognize she is suffering. Other family members expected to arrive today/tomorrow. We have planned another meeting Wednesday 4pm and will focus on allowing her to have more comfort and see how dialysis goes in the meantime.   All questions/concerns addressed. Emotional support provided.   Exam: Alert, confused. Grimacing. Does not  follow commands or recognize family at bedside. Thin, frail. Breathing regular, unlabored. Abd flat.   Plan: - Pain: OxyIR 5 mg TID - reassess efficacy tomorrow. Do not hold doses as family values her comfort as priority. OxyIR 5 mg every 4 hours PRN. Fenyanyl 25 mcg IV every 2 hours PRN.  - Family struggling with consideration of stopping dialysis.  - Repeat family meeting Wednesday 4pm.  - Allow for family visitation after hours to allow time for family flying in from out of town.   Bryant, NP Palliative Medicine Team Pager [phone] (Please see [email or web address] for schedule) Team Phone [phone]    Greater than 50%  of this time was spent counseling and coordinating care related to the above assessment and plan

## 2022-02-28 LAB — CBC WITH DIFFERENTIAL/PLATELET
Abs Immature Granulocytes: 0 10*3/uL (ref 0.00–0.07)
Basophils Absolute: 0.1 10*3/uL (ref 0.0–0.1)
Basophils Relative: 1 %
Eosinophils Absolute: 0.1 10*3/uL (ref 0.0–0.5)
Eosinophils Relative: 2 %
HCT: 34.6 % — ABNORMAL LOW (ref 36.0–46.0)
Hemoglobin: 11.4 g/dL — ABNORMAL LOW (ref 12.0–15.0)
Lymphocytes Relative: 19 %
Lymphs Abs: 1 10*3/uL (ref 0.7–4.0)
MCH: 32.9 pg (ref 26.0–34.0)
MCHC: 32.9 g/dL (ref 30.0–36.0)
MCV: 99.7 fL (ref 80.0–100.0)
Monocytes Absolute: 0.6 10*3/uL (ref 0.1–1.0)
Monocytes Relative: 12 %
Neutro Abs: 3.4 10*3/uL (ref 1.7–7.7)
Neutrophils Relative %: 66 %
Platelets: 136 10*3/uL — ABNORMAL LOW (ref 150–400)
RBC: 3.47 MIL/uL — ABNORMAL LOW (ref 3.87–5.11)
RDW: 15.9 % — ABNORMAL HIGH (ref 11.5–15.5)
WBC: 5.1 10*3/uL (ref 4.0–10.5)
nRBC: 0 % (ref 0.0–0.2)

## 2022-02-28 LAB — COMPREHENSIVE METABOLIC PANEL
ALT: 17 U/L (ref 0–44)
AST: 33 U/L (ref 15–41)
Albumin: 2.8 g/dL — ABNORMAL LOW (ref 3.5–5.0)
Alkaline Phosphatase: 91 U/L (ref 38–126)
Anion gap: 14 (ref 5–15)
BUN: 20 mg/dL (ref 8–23)
CO2: 27 mmol/L (ref 22–32)
Calcium: 10 mg/dL (ref 8.9–10.3)
Chloride: 94 mmol/L — ABNORMAL LOW (ref 98–111)
Creatinine, Ser: 7.1 mg/dL — ABNORMAL HIGH (ref 0.44–1.00)
GFR, Estimated: 6 mL/min — ABNORMAL LOW (ref >60)
Glucose, Bld: 226 mg/dL — ABNORMAL HIGH (ref 70–99)
Potassium: 3.3 mmol/L — ABNORMAL LOW (ref 3.5–5.1)
Sodium: 135 mmol/L (ref 135–145)
Total Bilirubin: 0.5 mg/dL (ref 0.3–1.2)
Total Protein: 6.3 g/dL — ABNORMAL LOW (ref 6.5–8.1)

## 2022-02-28 LAB — GLUCOSE, CAPILLARY
Glucose-Capillary: 217 mg/dL — ABNORMAL HIGH (ref 70–99)
Glucose-Capillary: 200 mg/dL — ABNORMAL HIGH (ref 70–99)
Glucose-Capillary: 180 mg/dL — ABNORMAL HIGH (ref 70–99)

## 2022-02-28 LAB — MAGNESIUM: Magnesium: 1.9 mg/dL (ref 1.7–2.4)

## 2022-02-28 LAB — PHOSPHORUS: Phosphorus: 5.8 mg/dL — ABNORMAL HIGH (ref 2.5–4.6)

## 2022-02-28 MED ORDER — GLYCOPYRROLATE 0.2 MG/ML IJ SOLN
0.1000 mg | INTRAMUSCULAR | Status: DC | PRN
  Administered 2022-02-28: 0.1 mg via INTRAVENOUS
  Filled 2022-02-28: qty 1

## 2022-02-28 MED ORDER — OXYCODONE HCL 20 MG/ML PO CONC
5.0000 mg | Freq: Three times a day (TID) | ORAL | Status: DC
  Administered 2022-02-28 – 2022-03-01 (×3): 5 mg via SUBLINGUAL
  Filled 2022-02-28: qty 0.3
  Filled 2022-02-28 (×3): qty 0.5

## 2022-02-28 MED ORDER — HYDROMORPHONE HCL 1 MG/ML IJ SOLN
0.2500 mg | INTRAMUSCULAR | Status: DC | PRN
  Administered 2022-02-28 – 2022-03-02 (×4): 0.25 mg via INTRAVENOUS
  Filled 2022-02-28 (×5): qty 0.5

## 2022-02-28 MED ORDER — OXYCODONE HCL 5 MG PO TABS: 5.0000 mg | ORAL_TABLET | Freq: Three times a day (TID) | ORAL | Status: DC

## 2022-02-28 MED ORDER — OXYCODONE HCL 20 MG/ML PO CONC
5.0000 mg | ORAL | Status: DC | PRN
  Administered 2022-03-01: 5 mg via SUBLINGUAL
  Filled 2022-02-28: qty 0.5

## 2022-02-28 NOTE — Progress Notes (Signed)
PROGRESS NOTE    Sarah Kelley  IOE:[id number] DOB: 04-01-1951 DOA: 02/18/2022 PCP: [name], MD  Chief Complaint  Patient presents with   Failure To Thrive    Brief Narrative:  Sarah Kelley is Sarah Kelley 71 y.o. female with medical history significant of ESRD on TTS dialysis, HTN, diabetes, HLD who presented with 2 weeks decreased PO intake, diarrhea, abdominal discomfort, globus sensation.  Imaging at presentation concerning for colitis/enteritis.  She's been treated with IV abx and has received dialysis while she's been here.  GI and SLP eval.  She has not improved despite treatment.  At this time, palliative conversations ongoing, we've recommended comfort measures.    Assessment & Plan:   Principal Problem:   Enterocolitis Active Problems:   Dysphagia   Goals of care, counseling/discussion   ESRD (end stage renal disease) (HCC)   Elevated troponin   Essential hypertension   Type 2 diabetes mellitus with chronic kidney disease on chronic dialysis, with long-term current use of insulin (HCC)   Hypothyroidism   Bilateral pleural effusion   Pain   Dementia without behavioral disturbance (HCC)   Hypokalemia   Malnutrition of moderate degree   Assessment and Plan: * Enterocolitis Presumed infectious initially on admission, with frequent diarrhea and decreased PO intake. GI pathogen pnl negative. C.Diff neg CT abdomen and pelvis concerning for colitis.  S/p ceftriaxone/flagyl. See ESRD below regarding fluid status GI following and appreciate their input and recommendations.  Pericolonic edema could be related to overload?  No recommendations for any GI procedures. Repeat CT C/Ranika Mcniel/P with findings concerning for layering small cbd stones, mild diffuse colonic wall thickening, moderate R and small L effusions -> bili wnl, LFT's wnl, don't think MRCP appropriate at this time  Goals of care, counseling/discussion Will c/s palliative care to help start [place]  conversations Appreciate assistance, she's been transitioned to DNR/DNI.  At this time, awaiting further family arrival prior to additional decisions.  Due to her lack of improvement, we've recommended comfort measures, family coming to terms with this and not quite ready.  Dysphagia Fluid filled esophagus on CT noted  Esophagram limited due to patient's inability to follow commands, notable for age related esophageal dysmotility with poor primary contractions and abnormal disordered tertiary contractions with delayed passage of barium  GI consulted, recommend adjusting diet to foods/beverages she tolerates SLP recommending thin liquids  Overnight with gagging and coughing with oral intake, will likely transition to comfort feeds, appreciate palliative care assistance  Elevated troponin Cards consulted on patient They indicate that this is likely demand ischemia Troponin is elevated but seem to be flattened.  Patient with doubt any chest pain. 2D echo from 02/13/2018 with EF of 55 to 18%,EXHB, grade 1 diastolic dysfunction. Repeat 2D echo with EF of 71%,IRCV, grade 1 diastolic dysfunction, normal right ventricular systolic function, mild to moderate MVR, no evidence of mitral stenosis. Patient seen in consultation by cardiology who reviewed 2D echo and EKG and will not pursue any further cardiac evaluation at this time, cardiology feels troponins flat not consistent with ACS and also feel patient is not an ideal candidate for invasive work-up.  ESRD (end stage renal disease) ([place]) Appears to be possibly volume overloaded with mod B pleural effusions on CT scan despite recent GI illness with diarrhea, poor PO intake, and dialysis on 02/18/2022 prior to presentation in the ED. Nephrology following and patient currently in HD Volume management per renal   Hypothyroidism TSH wnl with elevated free T4, unclear significance  Will need  outpatient follow up of thyroid function tests Looks like  "thyroid disease" is on her [place] list, but I dont see where shes on any sort of thyroid hormone replacement. Likely needs outpatient follow-up.   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 -Continue CBGs to before meals and at bedtime.   - hypoglycemia 8/25, hold basal, continue very sensitive SSI, will continue to monitor, has d10 ordered for now  Essential hypertension -fluctuating bP, intermittent hypotension -Continue to hold BP medications. -continue midodrine  Pain Unclear cause, see below Delirium? Fentanyl prn ordered Appreciate palliative care assistance with pain management Will continue to monitor  Bilateral pleural effusion Repeat CXR 8/24 with mild bilateral effusion, increased mild central pulm vascular congestion CT with moderate R and small L effusions on 8/27 -> suspected initially due to volume, though renal doesn't think she's overloaded Discussed with family, I don't think thoracentesis for definitive diagnosis likely to meaningfully change outcome - will hold off on this Volume per renal   Hypokalemia - Likely in part secondary to GI losses. -Defer repletion to nephrology as patient ESRD on HD.  Dementia without behavioral disturbance ([place]) Delirium precautions She's delirious, suspect related to her underlying dementia Her lack of PO intake is also likely related to her dementia          DVT prophylaxis: heparin Code Status: full Family Communication: son Disposition:   Status is: Inpatient Remains inpatient appropriate because: need for continued management   Consultants:  Renal GI  Procedures:  none  Antimicrobials:  Anti-infectives (From admission, onward)    Start     Dose/Rate Route Frequency Ordered Stop   02/27/22 2200  cefTRIAXone (ROCEPHIN) 2 g in sodium chloride 0.9 % 100 mL IVPB        2 g 200 mL/hr over 30  Minutes Intravenous Every 24 hours 02/27/22 1814 02/27/22 2337   02/18/22 2230  cefTRIAXone (ROCEPHIN) 2 g in sodium chloride 0.9 % 100 mL IVPB  Status:  Discontinued        2 g 200 mL/hr over 30 Minutes Intravenous Every 24 hours 02/18/22 2217 02/27/22 1814   02/18/22 2230  metroNIDAZOLE (FLAGYL) IVPB 500 mg  Status:  Discontinued        500 mg 100 mL/hr over 60 Minutes Intravenous Every 12 hours 02/18/22 2217 02/28/22 1322       Subjective: Discussed with son and family with interpreter Nausea/vomiting overnight Son frustrated that we don't see to know why she's not doing well  Objective: Vitals:   02/28/22 1700 02/28/22 1730 02/28/22 1800 02/28/22 1817  BP: (!) 115/36 (!) 91/55 (!) 101/37   Pulse: (!) 51 (!) 54 82   Resp: 19 (!) 22 (!) 24   Temp:    99 F (37.2 C)  TempSrc:    Axillary  SpO2: 96% 98% 99%   Weight:      Height:        Intake/Output Summary (Last 24 hours) at 02/28/2022 1854 Last data filed at 02/28/2022 1817 Gross per 24 hour  Intake 497.44 ml  Output 1000 ml  Net -502.56 ml   Filed Weights   02/23/22 1837 02/25/22 1236 02/28/22 1451  Weight: 55.8 kg 52.2 kg 56.2 kg    Examination:  General: No acute distress. Cardiovascular: RRR Lungs: unlabored Abdomen: Soft, nontender, nondistended  Neurological: moaning, confused, moving all extremities Extremities: No clubbing or cyanosis. No edema.  Data Reviewed: I have personally reviewed following labs and imaging studies  CBC: Recent Labs  Lab 02/23/22 0705 02/24/22 0148 02/25/22 0206 02/26/22 0925 02/27/22 0214 02/28/22 0158  WBC 5.2 6.6 4.8 5.2 5.2 5.1  NEUTROABS 3.3 4.2 2.7  --  3.0 3.4  HGB 12.5 13.5 12.5 12.6 13.5 11.4*  HCT 38.3 41.7 37.6 38.8 42.4 34.6*  MCV 100.5* 99.8 99.2 100.8* 101.2* 99.7  PLT 195 188 190 155 118* 136*    Basic Metabolic Panel: Recent Labs  Lab 02/23/22 0705 02/24/22 0148 02/25/22 0206 02/26/22 0925 02/27/22 0453 02/28/22 0158  NA 138 138 139 134*  136 135  K 3.3* 3.5 3.1* 3.6 3.2* 3.3*  CL 96* 99 101 97* 95* 94*  CO2 26 25 26 26 30 27   GLUCOSE 155* 86 112* 233* 188* 226*  BUN 28* 10 17 13 15 20   CREATININE 6.76* 4.21* 5.96* 4.87* 6.03* 7.10*  CALCIUM 9.8 9.5 9.9 9.9 10.2 10.0  MG 1.8 1.8 1.8  --  1.8 1.9  PHOS 6.7* 4.1 5.5*  --  5.7* 5.8*    GFR: Estimated Creatinine Clearance: 5.6 mL/min (Kori Colin) (by C-G formula based on SCr of 7.1 mg/dL (H)).  Liver Function Tests: Recent Labs  Lab 02/24/22 0148 02/25/22 0206 02/26/22 0925 02/27/22 0453 02/28/22 0158  AST 33 41 46* 40 33  ALT 19 20 20 18 17   ALKPHOS 107 110 117 106 91  BILITOT 0.4 0.6 0.3 0.4 0.5  PROT 6.8 6.7 6.1* 6.5 6.3*  ALBUMIN 2.6* 2.5* 2.4* 2.4* 2.8*    CBG: Recent Labs  Lab 02/27/22 1128 02/27/22 1607 02/27/22 2008 02/28/22 0800 02/28/22 1148  GLUCAP 169* 229* 244* 217* 200*     No results found for this or any previous visit (from the past 240 hour(s)).        Radiology Studies: No results found.      Scheduled Meds:  (feeding supplement) PROSource Plus  30 mL Oral BID BM   Chlorhexidine Gluconate Cloth  6 each Topical Q0600   feeding supplement  1 Container Oral TID BM   heparin  5,000 Units Subcutaneous Q8H   midodrine  5 mg Oral TID WC   oxyCODONE  5 mg Sublingual TID   thiamine (VITAMIN B1) injection  100 mg Intravenous Daily   Continuous Infusions:  dextrose 30 mL/hr at 02/28/22 1803     LOS: 9 days    Time spent: over 30 min    Fayrene Helper, MD [facility]   To contact the attending provider between 7A-7P or the covering provider during after hours 7P-7A, please log into the web site [email or web address] and access using universal [place] password for that web site. If you do not have the password, please call the hospital operator.  02/28/2022, 6:54 PM

## 2022-02-28 NOTE — Progress Notes (Signed)
Nutrition Follow-up  DOCUMENTATION CODES:   Non-severe (moderate) malnutrition in context of chronic illness  INTERVENTION:  Monitor for GOC  NUTRITION DIAGNOSIS:   Moderate Malnutrition related to chronic illness (ESRD, dementia) as evidenced by mild fat depletion, severe muscle depletion, mild muscle depletion.  Ongoing  GOAL:   Patient will meet greater than or equal to 90% of their needs  Goal not met  MONITOR:   PO intake, Supplement acceptance, Diet advancement, Labs, Weight trends, I & O's  REASON FOR ASSESSMENT:   Malnutrition Screening Tool    ASSESSMENT:   Pt admitted from home with 2 weeks of poor PO intake, diaphoresis and profuse diarrhea over the last 3-4 days. PMH significant for ESRD on TTS dialysis, HTN, HLD, DM on insulin.  MD documentation last night- RN reported patient gagging and coughing with attempted PO intake. Swallowing difficulties have been ongoing and was previously assessed by ST. Pt subsequently made NPO until further re-evaluation by day team and ST.   Palliative Care has been following for ongoing [place]. Noted plans for possible transition to comfort care however waiting for other family to visit to make decisions on transitioning.   Will continue to follow and make further recommendations based on [place].   Weight continues to trend downward. Admit weight: 56 kg Current weight: 52.5 kg  Medications: IV thiamine, IV flagyl  Labs: potassium 3.3, Cr 7.10, phos 5.8, GFR 6,  CBG's 183-244 x24 hours  Diet Order:   Diet Order             Diet NPO time specified  Diet effective now                   EDUCATION NEEDS:   No education needs have been identified at this time  Skin:  Skin Assessment: Reviewed RN Assessment  Last BM:  8/20 (type 7)  Height:   Ht Readings from Last 1 Encounters:  02/20/22 5' (1.524 m)    Weight:   Wt Readings from Last 1 Encounters:  02/25/22 52.2 kg   BMI:  Body mass index is 22.48  kg/m.  Estimated Nutritional Needs:   Kcal:  1500-1700  Protein:  75-90g  Fluid:  1L + UOP  Clayborne Dana, RDN, LDN Clinical Nutrition

## 2022-02-28 NOTE — Progress Notes (Signed)
SLP Cancellation Note  Patient Details Name: Sarah Kelley MRN: [id number] DOB: 10/23/1950   Cancelled treatment:       Reason Eval/Treat Not Completed: Medical issues which prohibited therapy. Discussed pt with palliative care, who suggests that we hold therapy for today. She plans to meet with them again tomorrow as they work toward establishing [place]. Will f/u as indicated after that meeting.     Osie Bond., M.A. [place] Office [phone]  Secure chat preferred  02/28/2022, 1:12 PM

## 2022-02-28 NOTE — Progress Notes (Addendum)
Received patient in bed to unit.  Alert calm follows simple commands Informed consent signed and in chart.   Treatment initiated: 1514 Treatment completed: 1817  Patient tolerated procedure Transported back to the room  Alert, without acute distress.  Hand-off given to patient's nurse.   Access used: fistula left arm  Access issues: none  Total UF removed: 1 liter Medication(s) given: none Post HD VS: 107/52 MAP 64 HR 62 RR 16 Sat 97%  on room air Temp 99.0 axillary Post HD weight: 55.2 kg bed wt   [facility]

## 2022-02-28 NOTE — Progress Notes (Signed)
Pt receives out-pt HD at [facility] on TTS. Pt arrives at 6:15 am for 6:30 chair time. Will assist as needed.   Melven Sartorius Renal Navigator [phone]

## 2022-02-28 NOTE — Progress Notes (Signed)
Pt cannulated 15G x2 w/o difficulty. AVG +/+ thrill bruit, connect A-A, V-V. BFR 300. UF goal 1L.

## 2022-02-28 NOTE — Progress Notes (Signed)
Dialysis Orders: [facility] 4h  400/500  52kg  2/2 baht  P2  Heparin 2000 LUA AVG - hep B labs here: pend - last HD 8/19, post wt 53.3kg, last wk edw lowered 3kg - last Hb 11.8 on 8/15, tsat 26%, no esa - iron sucrose IV 100 q hd thru 8/26 - doxercalciferol 5 ug IV tiw   Problem/Plan: Enterocolitis with diarrhea and poor p.o. intake.  Empiric IV Rocephin and Flagyl per admit team, GI panel negative GI consulted, little to no n.p.o. intake ESRD -HD TTS HD, on schedule, had discussion with son who was bedside and there is a family meeting today as well with other siblings. This son would like to stop dialysis which I would recommend as well. The dialysis is really not improving QOL and is an aggressive measure almost certainly making things more difficult for the pt and family (ie transport, being on hd, not feeling well after hd).   Will plan on hd 2nd shift today; appreciate palliative efforts. They have another meeting today. Appears some of the family would like dialysis stopped and move toward comfort measures but the POA is the son and he has not agreed as of yesterday. Nothing has changed from the renal standpoint; the pt is not doing well with RRT and HD certainly is not improving her QOL by any means. Rec from renal standpoint is to move toward comfort measures and stop iHD.  HTN/volume -BP stable on HD bilateral pleural effusions, on midodrine 1 L yesterday UF tolerated , does not appear volume overloaded with minimal p.o. intake.  Last O2 sat 97 % on room air yesterday none today AMS-chronic probable dementia, now with element of possible med with gabapentin possibly contributing.  Little to no p.o. intake, palliative care seeing per notes= another meeting today. Would advise comfort measures. Son is trying but pt does not want to eat. Dysphagia-work-up per admitting Elevated troponin-per cardiology no chest pain or ischemic symptoms follow-up trend no further work-up Anemia -Hgb 12.5 no  ESA noted was getting IV iron through 8/26 Secondary hyperparathyroidism -corrected calcium high holding Hectorol for now, 5.5 phosphorus no binders  FTT /Goals of care= palliative seeing patient with her multiple problems dysphagia ongoing abdominal pain advanced dementia= notes if patient has not improved over weekend, having meeting Monday 8/28 at 2:30 PM ="additional conversations about comfort focused care to be done. "  Subjective: Seen in room with daughters present, has complaints of back pain abdominal discomfort.  No chest pain or shortness of breath but  gagging even tho she's not eating currently.    Objective Vital signs in last 24 hours: Vitals:   02/27/22 0215 02/27/22 0458 02/27/22 0812 02/27/22 1228  BP: (!) 78/50   (!) 123/49  Pulse:  (!) 57  67  Resp: 14 17  17   Temp:  (!) 97 F (36.1 C) 98 F (36.7 C) 98 F (36.7 C)  TempSrc:  Oral Oral   SpO2:  95%    Weight:      Height:       Weight change:     Physical Exam: General: Elderly chronically ill female NAD Heart: RRR no MRG Lungs: CTA but decreased respiratory effort nonlabored breathing room air  Abdomen: NABS, soft, epigastric tenderness no rebound, no ascites, ND Extremities: No pedal edema Dialysis Access: Left upper arm AV Gore-Tex graft positive bruit     Labs: Basic Metabolic Panel: Recent Labs  Lab 02/25/22 0206 02/26/22 1308 02/27/22 0453 02/28/22 0158  NA 139 134* 136 135  K 3.1* 3.6 3.2* 3.3*  CL 101 97* 95* 94*  CO2 26 26 30 27   GLUCOSE 112* 233* 188* 226*  BUN 17 13 15 20   CREATININE 5.96* 4.87* 6.03* 7.10*  CALCIUM 9.9 9.9 10.2 10.0  PHOS 5.5*  --  5.7* 5.8*   Liver Function Tests: Recent Labs  Lab 02/26/22 0925 02/27/22 0453 02/28/22 0158  AST 46* 40 33  ALT 20 18 17   ALKPHOS 117 106 91  BILITOT 0.3 0.4 0.5  PROT 6.1* 6.5 6.3*  ALBUMIN 2.4* 2.4* 2.8*   No results for input(s): "LIPASE", "AMYLASE" in the last 168 hours. No results for input(s): "AMMONIA" in the last  168 hours. CBC: Recent Labs  Lab 02/24/22 0148 02/25/22 0206 02/26/22 0925 02/27/22 0214 02/28/22 0158  WBC 6.6 4.8 5.2 5.2 5.1  NEUTROABS 4.2 2.7  --  3.0 3.4  HGB 13.5 12.5 12.6 13.5 11.4*  HCT 41.7 37.6 38.8 42.4 34.6*  MCV 99.8 99.2 100.8* 101.2* 99.7  PLT 188 190 155 118* 136*   Cardiac Enzymes: No results for input(s): "CKTOTAL", "CKMB", "CKMBINDEX", "TROPONINI" in the last 168 hours. CBG: Recent Labs  Lab 02/27/22 0807 02/27/22 1128 02/27/22 1607 02/27/22 2008 02/28/22 0800  GLUCAP 215* 169* 229* 244* 217*    Studies/Results: CT CHEST ABDOMEN PELVIS WO CONTRAST  Result Date: 02/26/2022 CLINICAL DATA:  Failure to thrive, diarrhea EXAM: CT CHEST, ABDOMEN AND PELVIS WITHOUT CONTRAST TECHNIQUE: Multidetector CT imaging of the chest, abdomen and pelvis was performed following the standard protocol without IV contrast. RADIATION DOSE REDUCTION: This exam was performed according to the departmental dose-optimization program which includes automated exposure control, adjustment of the mA and/or kV according to patient size and/or use of iterative reconstruction technique. COMPARISON:  CT chest, abdomen and pelvis dated February 18, 2022 FINDINGS: CT CHEST FINDINGS Cardiovascular: Normal heart size. Pericardial effusion. Severe left main and three-vessel coronary artery calcifications. Mitral annular calcifications. Normal caliber thoracic aorta with severe calcified plaque. Mediastinum/Nodes: Patulous esophagus. Thyroid is unremarkable. Mildly enlarged calcified mediastinal lymph nodes, likely sequela of prior granulomatous infection,, reference periaortic lymph node measuring 1.0 cm in short axis on series 3, image 18, unchanged when compared to prior exam. Lungs/Pleura: Central airways are patent. Moderate right and small left pleural effusions, unchanged when compared with prior exam. Bibasilar atelectasis. No evidence of pneumothorax. Musculoskeletal: No chest wall mass or suspicious  bone lesions identified. CT ABDOMEN PELVIS FINDINGS Hepatobiliary: No focal liver abnormality. Gallstones with no gallbladder wall thickening. Unremarkable. No biliary ductal dilation. Linear hyperdense material seen in the distal common bile duct on series 3, image 60 and coronal series 6, image 54. Pancreas: Unremarkable. No pancreatic ductal dilatation or surrounding inflammatory changes. Spleen: Normal in size without focal abnormality. Adrenals/Urinary Tract: Bilateral adrenal glands are unremarkable. Atrophic bilateral kidneys with no hydronephrosis. Thick-walled urinary bladder, unchanged when compared to prior. Stomach/Bowel: Stomach is within normal limits. Appendix appears normal. Mild diffuse colonic wall thickening. No evidence of obstruction or inflammatory change. Vascular/Lymphatic: Aortic atherosclerosis. No enlarged abdominal or pelvic lymph nodes. Reproductive: Calcifications of the uterus.  No adnexal masses. Other: Small fat containing ventral abdominal wall hernias. Abdominopelvic ascites. Soft tissue anasarca. Musculoskeletal: No acute or significant osseous findings. IMPRESSION: 1. Linear hyperdensity of the distal common bile duct bile duct, concerning for layering small common bile duct stones. Could be further evaluated with MRCP. 2. Mild diffuse colonic wall thickening, similar to prior, can be seen in the setting of colitis.  3. Thick-walled urinary bladder, unchanged when compared to prior. 4. Unchanged moderate right and small left pleural effusions with adjacent atelectasis. 5. Aortic Atherosclerosis (ICD10-I70.0). Electronically Signed   By: Yetta Glassman M.D.   On: 02/26/2022 19:42   Medications:  dextrose 30 mL/hr at 02/27/22 2259   metronidazole 500 mg (02/27/22 2301)    (feeding supplement) PROSource Plus  30 mL Oral BID BM   Chlorhexidine Gluconate Cloth  6 each Topical Q0600   feeding supplement  1 Container Oral TID BM   heparin  5,000 Units Subcutaneous Q8H    insulin aspart  0-6 Units Subcutaneous TID WC   midodrine  5 mg Oral TID WC   multivitamin  1 tablet Oral QHS   oxyCODONE  5 mg Oral TID   polyethylene glycol  17 g Oral Daily   thiamine (VITAMIN B1) injection  100 mg Intravenous Daily

## 2022-02-28 NOTE — Progress Notes (Incomplete)
Palliative:  HPI: 71 y.o. 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 Pt presents to ED with 2 week h/o reduced PO intake, diaphoresis over past 3-4 days and profuse diarrhea - GI consulted stool studies (-) though (+) colitis on BS antibiotics. (+) Dysphagia. Palliative care has been asked to get involved to further address goals of care in the setting of overalll decline and failure to thrive.    I met today with Sarah Kelley, son Sarah Kelley, Dr. Florene Glen, and NP student with Dr. Florene Glen utilizing [place] interpreter via speaker phone. Sarah Kelley is present but confused and not verbal so she is unable to participate in conversation. We discussed with Sarah Kelley her decline with struggling to take pills and not eating/drinking. Family has been reporting that she is vomiting but looking at her today I think what they have been likely witnessing is aspiration with severe dysphagia from advanced dementia. I explained to [facility] that she is unable to swallow and is spitting up and congested because her brain is not telling her swallowing muscles how to get food/drink down the correct pipe and she is so weak even these muscles are weak as well. Unfortunately we do not have a medication or treatment to fix this problem. We further explained that this is the end stages of her disease and another sign of end of life. We recommended and explained comfort feeds as tolerated and offering water to keep mouth from being dry. Son seems frustrated with the lack of options but is accepting of recommendations. Meeting confirmed for tomorrow 4pm.   Exam: Alert, confused. Grimacing. Does not follow commands or recognize family at bedside. Thin, frail. Breathing regular, unlabored. Abd flat.    Plan: - Pain: OxyIR 5 mg TID. Do not hold doses as family values her comfort as priority. OxyIR 5 mg every 4 hours PRN.  Dilaudid 0.25 mg IV mcg IV every 2 hours PRN.  - Family struggling with consideration of stopping dialysis.  - Repeat family meeting Wednesday 4pm.   15 min  Vinie Sill, NP Palliative Medicine Team Pager [phone] (Please see [email or web address] for schedule) Team Phone [phone]    Greater than 50%  of this time was spent counseling and coordinating care related to the above assessment and plan

## 2022-02-28 NOTE — Inpatient Diabetes Management (Signed)
Inpatient Diabetes Program Recommendations  AACE/ADA: New Consensus Statement on Inpatient Glycemic Control (2015)  Target Ranges:  Prepandial:   less than 140 mg/dL      Peak postprandial:   less than 180 mg/dL (1-2 hours)      Critically ill patients:  140 - 180 mg/dL   Lab Results  Component Value Date   GLUCAP 217 (H) 02/28/2022   HGBA1C 7.7 (H) 02/18/2022    Review of Glycemic Control  Latest Reference Range & Units 02/27/22 11:28 02/27/22 16:07 02/27/22 20:08 02/28/22 08:00  Glucose-Capillary 70 - 99 mg/dL 169 (H) 229 (H) 244 (H) 217 (H)  (H): Data is abnormally high Diabetes history: DM2 Outpatient Diabetes medications: Lantus 14 units QAM, Lantus 6 units QPM Current orders for Inpatient glycemic control: Novolog 0-6 units TID with meals   Inpatient Diabetes Program Recommendations:     If aligning with goals of care, consider ordering Semglee 5 units Q24H.  Thanks, Bronson Curb, MSN, RNC-OB Diabetes Coordinator [phone] (8a-5p)

## 2022-03-01 DIAGNOSIS — R52 Pain, unspecified: Secondary | ICD-10-CM

## 2022-03-01 LAB — GLUCOSE, CAPILLARY
Glucose-Capillary: 168 mg/dL — ABNORMAL HIGH (ref 70–99)
Glucose-Capillary: 160 mg/dL — ABNORMAL HIGH (ref 70–99)
Glucose-Capillary: 172 mg/dL — ABNORMAL HIGH (ref 70–99)

## 2022-03-01 MED ORDER — OXYCODONE HCL 20 MG/ML PO CONC
10.0000 mg | Freq: Four times a day (QID) | ORAL | Status: DC
  Administered 2022-03-01 – 2022-03-03 (×4): 10 mg via SUBLINGUAL
  Filled 2022-03-01 (×6): qty 0.5

## 2022-03-01 MED ORDER — OXYCODONE HCL 20 MG/ML PO CONC: 5.0000 mg | ORAL | Status: DC | PRN

## 2022-03-01 MED ORDER — POTASSIUM CHLORIDE 10 MEQ/100ML IV SOLN
10.0000 meq | INTRAVENOUS | Status: AC
  Administered 2022-03-01 (×3): 10 meq via INTRAVENOUS
  Filled 2022-03-01 (×3): qty 100

## 2022-03-01 NOTE — Progress Notes (Signed)
IV potassium is not running. Lost IV access after first bag started infusing. Dr Verlon Au aware. IV team consult placed for new IV access.

## 2022-03-01 NOTE — Plan of Care (Signed)
  Problem: Pain Managment: Goal: General experience of comfort will improve Outcome: Not Progressing   Problem: Safety: Goal: Ability to remain free from injury will improve Outcome: Not Progressing   Problem: Nutrition: Goal: Adequate nutrition will be maintained Outcome: Not Progressing   

## 2022-03-01 NOTE — Progress Notes (Addendum)
Palliative:  HPI: 71 y.o. 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 Pt presents to ED with 2 week h/o reduced PO intake, diaphoresis over past 3-4 days and profuse diarrhea - GI consulted stool studies (-) though (+) colitis on BS antibiotics. (+) Dysphagia. Palliative care has been asked to get involved to further address goals of care in the setting of overalll decline and failure to thrive.    I met again today with multiple members of Ronnica's children and grandchildren along with [place] interpreter after receiving report and updates from RN. Another long discussion today explaining to family members that have just arrived about Rilley's advanced dementia, potential colitis, and ESRD on dialysis. I explained that Miko's most significant concern is for her advanced dementia and this is the main factor in her decline even if this was triggered by a colitis infection. I explained dementia and loss of swallowing abilities and aspiration risk. I explained that there is no medical treatment or intervention to make her be able to swallow and to eat and drink and this is often the last stages of dementia. I discussed with family my fear that she is at end of life and that the outcome is expected to be the same regardless of what we do or don't do. I encouraged them to consider Rebel's quality of life, risks vs benefits of interventions, and what we are asking her to go through. We discussed the struggles of knowing clearly from [place] about her pain and discomfort as she is unable to clearly tell us due to her dementia. Multiple children express concern that she is suffering and in pain and are requesting increased pain medications. We discussed the risks vs benefits of additional pain medication but also in the context of the bigger picture of suffering at end of life. Family are  struggling greatly with the decisions. They ultimately desire increasing her pain medication and I discussed increase in dosage and frequency - majority of children agree and desire she not to suffer so I will increase medication. I did explain that ethically we should not allow her to suffer at end of life and we should provide her some level of comfort and relief if we are able - the 2 previously opposed children did not verbally disagree at this time.   Family is torn as many feel strongly that she is suffering and we should not put her through dialysis or more suffering while others feel strongly that by agreeing to stop dialysis they are taking her life in their hands. I reminded family throughout their heated discussion that it is evident that they all love Maxi very much and just because they disagree this does not change that they are family and that they all want what is best for her - they just see the decisions from different views. Family are unable to come to an agreement on dialysis so I explained that sometimes patients physically make these decisions for [place] and I will work with the kidney doctor and medical team to make sure that we stop dialysis when this is considered to be unsafe for her physically as this can cause her death sooner in that situation. They do understand that the recommendation from the kidney doctor is to stop dialysis due to this not improving her quality of life. I explained how this is not going to improve her dementia  or make her better.   I reminded them to spend time with [facility] and each other through this difficult time and we will take one day at a time and do everything we can to care for her the best we are able. Family were appreciative of the time and discussion.   Of note, one daughter asked about discussing her mother's care with her boss. Unclear reasoning for this but potentially that this person can relay information to physician contacts she has. I explained  that unfortunately I am unable to speak with anyone outside family and the medical community.   All questions/concerns addressed. Emotional support provided.   Exam: Awake but confused. Limited verbalization. Unable to communicate needs or follow commands. Thin, frail. Grimacing. Distressed with care - even with vital signs/BP. Breathing regular, unlabored. Cough still congested - likely aspirating secretions.   Plan: - Pain: Increase OxyIR 10 mg QID. Do not hold doses as family values her comfort as priority. OxyIR 5 mg every 4 hours PRN. Dilaudid 0.25 mg IV mcg IV every 2 hours PRN.  - Family struggling with consideration of stopping dialysis and electing comfort care. They are tormented over thinking they are deciding between suffering and feeling they are hastening her death.    818 min  Vinie Sill, NP Palliative Medicine Team Pager [phone] (Please see [email or web address] for schedule) Team Phone [phone]    Greater than 50%  of this time was spent counseling and coordinating care related to the above assessment and plan

## 2022-03-01 NOTE — Progress Notes (Signed)
PROGRESS NOTE    Sarah Kelley  YIR:[id number] DOB: 1950-08-05 DOA: 02/18/2022 PCP: [name], MD  Chief Complaint  Patient presents with   Failure To Thrive    Brief Narrative:  Sarah Kelley is a 71 y.o. female with medical history significant of ESRD on TTS dialysis, HTN, diabetes, HLD who presented with 2 weeks decreased PO intake, diarrhea, abdominal discomfort, globus sensation.  Imaging at presentation concerning for colitis/enteritis.  She's been treated with IV abx and has received dialysis while she's been here.  GI and SLP eval.  She has not improved despite treatment.  At this time, palliative conversations ongoing, we've recommended comfort measures.    Assessment & Plan:   Principal Problem:   Enterocolitis Active Problems:   Dysphagia   Goals of care, counseling/discussion   ESRD (end stage renal disease) (HCC)   Elevated troponin   Essential hypertension   Type 2 diabetes mellitus with chronic kidney disease on chronic dialysis, with long-term current use of insulin (HCC)   Hypothyroidism   Bilateral pleural effusion   Pain   Dementia without behavioral disturbance (HCC)   Hypokalemia   Malnutrition of moderate degree   Assessment and Plan: * Enterocolitis Presumed infectious initially on admission, with frequent diarrhea and decreased PO intake. GI pathogen pnl negative. C.Diff neg CT abdomen and pelvis concerning for colitis.  S/p ceftriaxone/flagyl. See ESRD below regarding fluid status GI following and appreciate their input and recommendations.  Pericolonic edema could be related to overload?  No recommendations for any GI procedures. Repeat CT C/A/P with findings concerning for layering small cbd stones, mild diffuse colonic wall thickening, moderate R and small L effusions -> bili wnl, LFT's wnl, don't think MRCP appropriate at this time  Goals of care, counseling/discussion Will c/s palliative care to help start [place]  conversations Appreciate assistance, she's been transitioned to DNR/DNI. Repeat meeting 8/31 with care team including nephrology [if possible] and pallaitive care. Appreciate Palliative guidance  Dysphagia Fluid filled esophagus on CT noted  Esophagram limited due to patient's inability to follow commands, notable for age related esophageal dysmotility with poor primary contractions and abnormal disordered tertiary contractions with delayed passage of barium  GI consulted, recommend adjusting diet to foods/beverages she tolerates SLP recommending thin liquids  Overtly with gagging and coughing with oral intake, will likely transition to comfort feeds in the next several days, appreciate palliative care assistance  Elevated troponin Cards consulted on patient They indicate that this is likely demand ischemia Troponin is elevated but seem to be flattened.  Patient with doubt any chest pain. 2D echo from 02/13/2018 with EF of 55 to 50%,KXFG, grade 1 diastolic dysfunction. Repeat 2D echo with EF of 18%,EXHB, grade 1 diastolic dysfunction, normal right ventricular systolic function, mild to moderate MVR, no evidence of mitral stenosis. Patient seen in consultation by cardiology who reviewed 2D echo and EKG and will not pursue any further cardiac evaluation at this time, cardiology feels troponins flat not consistent with ACS and also feel patient is not an ideal candidate for invasive work-up.  ESRD (end stage renal disease) ([place]) Appears to be possibly volume overloaded with mod B pleural effusions on CT scan despite recent GI illness with diarrhea, poor PO intake, and dialysis on 02/18/2022 prior to presentation in the ED. Nephrology following and patient currently in HD Volume management per renal  We are discussing with family whether to wean HD  Hypothyroidism TSH wnl with elevated free T4, unclear significance  Will need outpatient follow up  of thyroid function tests Looks like "thyroid  disease" is on her [place] list, but I dont see where shes on any sort of thyroid hormone replacement. Likely needs outpatient follow-up.   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 -Continue CBGs to before meals and at bedtime.   - hypoglycemia 8/25, hold basal, continue very sensitive SSI--has d10 @30  cc/h ordered for now--CBG 160-200's  Essential hypertension -fluctuating bP, intermittent hypotension--periodically getting albumin at HD -Continue to hold BP medications. -continue midodrine  Pain Unclear cause, see below Delirium? Fentanyl prn ordered Appreciate palliative care assistance with pain management Will continue to monitor  Bilateral pleural effusion Repeat CXR 8/24 with mild bilateral effusion, increased mild central pulm vascular congestion CT with moderate R and small L effusions on 8/27 -> suspected initially due to volume, though renal doesn't think she's overloaded Discussed with family, I don't think thoracentesis for definitive diagnosis likely to meaningfully change outcome - will hold off on this Volume per renal   Hypokalemia - Likely in part secondary to GI losses. -Defer repletion to nephrology as patient ESRD on HD.  Dementia without behavioral disturbance ([place]) Delirium precautions She's delirious, with some intermittent episodes of clarity Her lack of PO intake is also likely related to her dementia        DVT prophylaxis: heparin Code Status: full Family Communication: son Disposition:   Status is: Inpatient Remains inpatient appropriate because: need for continued management   Consultants:  Renal GI  Procedures:  none  Antimicrobials:  Anti-infectives (From admission, onward)    Start     Dose/Rate Route Frequency Ordered Stop   02/27/22 2200  cefTRIAXone (ROCEPHIN) 2 g in sodium chloride 0.9 % 100 mL IVPB        2  g 200 mL/hr over 30 Minutes Intravenous Every 24 hours 02/27/22 1814 02/28/22 2250   02/18/22 2230  cefTRIAXone (ROCEPHIN) 2 g in sodium chloride 0.9 % 100 mL IVPB  Status:  Discontinued        2 g 200 mL/hr over 30 Minutes Intravenous Every 24 hours 02/18/22 2217 02/27/22 1814   02/18/22 2230  metroNIDAZOLE (FLAGYL) IVPB 500 mg  Status:  Discontinued        500 mg 100 mL/hr over 60 Minutes Intravenous Every 12 hours 02/18/22 2217 02/28/22 1322       Subjective:  Frail --talking minimally with family Doesn't engage with me No overt pain/chills rigors  Objective: Vitals:   02/28/22 1940 03/01/22 0515 03/01/22 0751 03/01/22 1608  BP: (!) 122/47  (!) 104/35 (!) 87/30  Pulse: 71     Resp: 18   20  Temp: 97.9 F (36.6 C)  97.8 F (36.6 C) (!) 96.5 F (35.8 C)  TempSrc: Axillary  Oral Axillary  SpO2: 100%     Weight:  56.8 kg    Height:        Intake/Output Summary (Last 24 hours) at 03/01/2022 1735 Last data filed at 02/28/2022 2301 Gross per 24 hour  Intake 239.77 ml  Output 1000 ml  Net -760.23 ml    Filed Weights   02/25/22 1236 02/28/22 1451 03/01/22 0515  Weight: 52.2 kg 56.2 kg 56.8 kg    Examination:  Cacechtic female, older than stated age 71 b no rales rhonchi Abd soft nt nd no rebound No le edema Neuro  moves limbs--power intact--full eval deferred  Data Reviewed: I have personally  reviewed following labs and imaging studies  CBC: Recent Labs  Lab 02/23/22 0705 02/24/22 0148 02/25/22 0206 02/26/22 0925 02/27/22 0214 02/28/22 0158  WBC 5.2 6.6 4.8 5.2 5.2 5.1  NEUTROABS 3.3 4.2 2.7  --  3.0 3.4  HGB 12.5 13.5 12.5 12.6 13.5 11.4*  HCT 38.3 41.7 37.6 38.8 42.4 34.6*  MCV 100.5* 99.8 99.2 100.8* 101.2* 99.7  PLT 195 188 190 155 118* 136*     Basic Metabolic Panel: Recent Labs  Lab 02/23/22 0705 02/24/22 0148 02/25/22 0206 02/26/22 0925 02/27/22 0453 02/28/22 0158  NA 138 138 139 134* 136 135  K 3.3* 3.5 3.1* 3.6 3.2* 3.3*  CL 96*  99 101 97* 95* 94*  CO2 26 25 26 26 30 27   GLUCOSE 155* 86 112* 233* 188* 226*  BUN 28* 10 17 13 15 20   CREATININE 6.76* 4.21* 5.96* 4.87* 6.03* 7.10*  CALCIUM 9.8 9.5 9.9 9.9 10.2 10.0  MG 1.8 1.8 1.8  --  1.8 1.9  PHOS 6.7* 4.1 5.5*  --  5.7* 5.8*     GFR: Estimated Creatinine Clearance: 5.6 mL/min (A) (by C-G formula based on SCr of 7.1 mg/dL (H)).  Liver Function Tests: Recent Labs  Lab 02/24/22 0148 02/25/22 0206 02/26/22 0925 02/27/22 0453 02/28/22 0158  AST 33 41 46* 40 33  ALT 19 20 20 18 17   ALKPHOS 107 110 117 106 91  BILITOT 0.4 0.6 0.3 0.4 0.5  PROT 6.8 6.7 6.1* 6.5 6.3*  ALBUMIN 2.6* 2.5* 2.4* 2.4* 2.8*     CBG: Recent Labs  Lab 02/28/22 0800 02/28/22 1148 02/28/22 2224 03/01/22 0723 03/01/22 1138  GLUCAP 217* 200* 180* 168* 160*      No results found for this or any previous visit (from the past 240 hour(s)).        Radiology Studies: No results found.      Scheduled Meds:  (feeding supplement) PROSource Plus  30 mL Oral BID BM   feeding supplement  1 Container Oral TID BM   heparin  5,000 Units Subcutaneous Q8H   midodrine  5 mg Oral TID WC   oxyCODONE  10 mg Sublingual QID   thiamine (VITAMIN B1) injection  100 mg Intravenous Daily   Continuous Infusions:  dextrose 30 mL/hr at 03/01/22 0211     LOS: 10 days    Time spent: [place], MD [facility]   To contact the attending provider between 7A-7P or the covering provider during after hours 7P-7A, please log into the web site [email or web address] and access using universal [place] password for that web site. If you do not have the password, please call the hospital operator.  03/01/2022, 5:35 PM

## 2022-03-01 NOTE — Progress Notes (Signed)
Dialysis Orders: [facility] 4h  400/500  52kg  2/2 baht  P2  Heparin 2000 LUA AVG - hep B labs here: pend - last HD 8/19, post wt 53.3kg, last wk edw lowered 3kg - last Hb 11.8 on 8/15, tsat 26%, no esa - iron sucrose IV 100 q hd thru 8/26 - doxercalciferol 5 ug IV tiw   Problem/Plan: Enterocolitis with diarrhea and poor p.o. intake.  Empiric IV Rocephin and Flagyl per admit team, GI panel negative GI consulted, little to no n.p.o. intake ESRD -HD TTS HD, on schedule, had discussion with son who was bedside and there is a family meeting today as well with other siblings. This son would like to stop dialysis which I would recommend as well. The dialysis is really not improving QOL and is an aggressive measure almost certainly making things more difficult for the pt and family (ie transport, being on hd, not feeling well after hd).   Tolerated hd 8/29 w 1L net UF. No absolute indication for RRT and the patient appears to be  comfortable; plan for next HD treatment tomorrow. Will try for more UF as tolerated  Another meeting today and appears most of the family would like dialysis stopped and move toward comfort measures but the POA is the son and he has not agreed as of yesterday. Nothing has changed from the renal standpoint; the pt is not doing well with RRT and HD certainly is not improving her QOL by any means. Rec from renal standpoint is to move toward comfort measures and stop iHD.  HTN/volume -BP stable on HD bilateral pleural effusions, on midodrine 1 L yesterday UF tolerated , does not appear volume overloaded with minimal p.o. intake.  Last O2 sat 97 % on room air yesterday none today AMS-chronic probable dementia, now with element of possible med with gabapentin possibly contributing.  Little to no p.o. intake, palliative care seeing per notes= another meeting today. Would advise comfort measures. Son is trying but pt does not want to eat. Dysphagia-work-up per admitting Elevated  troponin-per cardiology no chest pain or ischemic symptoms follow-up trend no further work-up Anemia -Hgb 12.5 no ESA noted was getting IV iron through 8/26 Secondary hyperparathyroidism -corrected calcium high holding Hectorol for now, 5.5 phosphorus no binders  FTT /Goals of care= palliative seeing patient with her multiple problems dysphagia ongoing abdominal pain advanced dementia= notes if patient has not improved over weekend, having meeting Monday 8/28 at 2:30 PM ="additional conversations about comfort focused care to be done. "  Subjective: Seen in room with son who has POA  present, has complaints of back pain abdominal discomfort.  No chest pain or shortness of breath but  gagging even tho she's not eating currently.  Having difficult time swallowing pills as well.  Objective Vital signs in last 24 hours: Vitals:   02/28/22 1817 02/28/22 1940 03/01/22 0515 03/01/22 0751  BP:  (!) 122/47  (!) 104/35  Pulse:  71    Resp:  18    Temp: 99 F (37.2 C) 97.9 F (36.6 C)  97.8 F (36.6 C)  TempSrc: Axillary Axillary  Oral  SpO2:  100%    Weight:   56.8 kg   Height:       Weight change:     Physical Exam: General: Elderly chronically ill female NAD Heart: RRR no MRG Lungs: CTA but decreased respiratory effort nonlabored breathing room air  Abdomen: NABS, soft, epigastric tenderness no rebound, no ascites, ND Extremities: No  pedal edema Dialysis Access: Left upper arm AV Gore-Tex graft positive bruit     Labs: Basic Metabolic Panel: Recent Labs  Lab 02/25/22 0206 02/26/22 0925 02/27/22 0453 02/28/22 0158  NA 139 134* 136 135  K 3.1* 3.6 3.2* 3.3*  CL 101 97* 95* 94*  CO2 26 26 30 27   GLUCOSE 112* 233* 188* 226*  BUN 17 13 15 20   CREATININE 5.96* 4.87* 6.03* 7.10*  CALCIUM 9.9 9.9 10.2 10.0  PHOS 5.5*  --  5.7* 5.8*   Liver Function Tests: Recent Labs  Lab 02/26/22 0925 02/27/22 0453 02/28/22 0158  AST 46* 40 33  ALT 20 18 17   ALKPHOS 117 106 91  BILITOT  0.3 0.4 0.5  PROT 6.1* 6.5 6.3*  ALBUMIN 2.4* 2.4* 2.8*   No results for input(s): "LIPASE", "AMYLASE" in the last 168 hours. No results for input(s): "AMMONIA" in the last 168 hours. CBC: Recent Labs  Lab 02/24/22 0148 02/25/22 0206 02/26/22 0925 02/27/22 0214 02/28/22 0158  WBC 6.6 4.8 5.2 5.2 5.1  NEUTROABS 4.2 2.7  --  3.0 3.4  HGB 13.5 12.5 12.6 13.5 11.4*  HCT 41.7 37.6 38.8 42.4 34.6*  MCV 99.8 99.2 100.8* 101.2* 99.7  PLT 188 190 155 118* 136*   Cardiac Enzymes: No results for input(s): "CKTOTAL", "CKMB", "CKMBINDEX", "TROPONINI" in the last 168 hours. CBG: Recent Labs  Lab 02/27/22 2008 02/28/22 0800 02/28/22 1148 02/28/22 2224 03/01/22 0723  GLUCAP 244* 217* 200* 180* 168*    Studies/Results: No results found. Medications:  dextrose 30 mL/hr at 03/01/22 0211   potassium chloride      (feeding supplement) PROSource Plus  30 mL Oral BID BM   feeding supplement  1 Container Oral TID BM   heparin  5,000 Units Subcutaneous Q8H   midodrine  5 mg Oral TID WC   oxyCODONE  5 mg Sublingual TID   thiamine (VITAMIN B1) injection  100 mg Intravenous Daily

## 2022-03-02 DIAGNOSIS — Z515 Encounter for palliative care: Secondary | ICD-10-CM

## 2022-03-02 DIAGNOSIS — R52 Pain, unspecified: Secondary | ICD-10-CM

## 2022-03-02 DIAGNOSIS — Z7189 Other specified counseling: Secondary | ICD-10-CM

## 2022-03-02 DIAGNOSIS — F0390 Unspecified dementia without behavioral disturbance: Secondary | ICD-10-CM

## 2022-03-02 LAB — RENAL FUNCTION PANEL
Albumin: 2.7 g/dL — ABNORMAL LOW (ref 3.5–5.0)
Anion gap: 15 (ref 5–15)
BUN: 14 mg/dL (ref 8–23)
CO2: 26 mmol/L (ref 22–32)
Calcium: 9.7 mg/dL (ref 8.9–10.3)
Chloride: 91 mmol/L — ABNORMAL LOW (ref 98–111)
Creatinine, Ser: 6.83 mg/dL — ABNORMAL HIGH (ref 0.44–1.00)
GFR, Estimated: 6 mL/min — ABNORMAL LOW (ref >60)
Glucose, Bld: 181 mg/dL — ABNORMAL HIGH (ref 70–99)
Phosphorus: 5 mg/dL — ABNORMAL HIGH (ref 2.5–4.6)
Potassium: 4.1 mmol/L (ref 3.5–5.1)
Sodium: 132 mmol/L — ABNORMAL LOW (ref 135–145)

## 2022-03-02 LAB — CBC
HCT: 38.4 % (ref 36.0–46.0)
Hemoglobin: 12.6 g/dL (ref 12.0–15.0)
MCH: 33.1 pg (ref 26.0–34.0)
MCHC: 32.8 g/dL (ref 30.0–36.0)
MCV: 100.8 fL — ABNORMAL HIGH (ref 80.0–100.0)
Platelets: 128 10*3/uL — ABNORMAL LOW (ref 150–400)
RBC: 3.81 MIL/uL — ABNORMAL LOW (ref 3.87–5.11)
RDW: 16.1 % — ABNORMAL HIGH (ref 11.5–15.5)
WBC: 5.1 10*3/uL (ref 4.0–10.5)
nRBC: 0 % (ref 0.0–0.2)

## 2022-03-02 LAB — GLUCOSE, CAPILLARY
Glucose-Capillary: 199 mg/dL — ABNORMAL HIGH (ref 70–99)
Glucose-Capillary: 146 mg/dL — ABNORMAL HIGH (ref 70–99)
Glucose-Capillary: 175 mg/dL — ABNORMAL HIGH (ref 70–99)
Glucose-Capillary: 176 mg/dL — ABNORMAL HIGH (ref 70–99)

## 2022-03-02 MED ORDER — ALBUMIN HUMAN 25 % IV SOLN
25.0000 g | Freq: Once | INTRAVENOUS | Status: AC
  Administered 2022-03-02: 25 g via INTRAVENOUS
  Filled 2022-03-02: qty 100

## 2022-03-02 MED ORDER — HEPARIN SODIUM (PORCINE) 1000 UNIT/ML DIALYSIS
2000.0000 [IU] | Freq: Once | INTRAMUSCULAR | Status: AC
  Administered 2022-03-02: 2000 [IU] via INTRAVENOUS_CENTRAL
  Filled 2022-03-02 (×2): qty 2

## 2022-03-02 NOTE — Plan of Care (Signed)
  Problem: Coping: Goal: Ability to adjust to condition or change in health will improve Outcome: Not Progressing   Problem: Nutritional: Goal: Maintenance of adequate nutrition will improve Outcome: Not Progressing   Problem: Pain Managment: Goal: General experience of comfort will improve Outcome: Not Progressing

## 2022-03-02 NOTE — Progress Notes (Signed)
Dialysis Orders: [facility] 4h  400/500  52kg  2/2 baht  P2  Heparin 2000 LUA AVG - hep B labs here: pend - last HD 8/19, post wt 53.3kg, last wk edw lowered 3kg - last Hb 11.8 on 8/15, tsat 26%, no esa - iron sucrose IV 100 q hd thru 8/26 - doxercalciferol 5 ug IV tiw   Problem/Plan: Enterocolitis with diarrhea and poor p.o. intake.  Empiric IV Rocephin and Flagyl per admit team, GI panel negative GI consulted, little to no n.p.o. intake ESRD -HD TTS HD, on schedule, had discussion with son a few times. Multiple family meetings with other siblings. No change and the dialysis is really not improving QOL and is an aggressive measure almost certainly making things more difficult for the pt and family (ie transport, being on hd, not feeling well after hd).   Pt seen on HD 88/36 which was only after a 171ml bolus And unable to remove fluid because of the hypotension. Will try Albumin as the pt not able or refuses to swallow. Patient also not answering questions even with the use of a translator.  She is a borderline candidate at best for dialysis at this time and with the inability to ultrafiltrate and remove fluid she will not tolerate dialysis. Nothing has changed from the renal standpoint; the pt is not doing well with RRT with hypotension limiting fluid removal. HD certainly not improving her QOL by any means. Rec from renal standpoint is to move toward comfort measures and stop iHD.  HTN/volume -BP stable on HD bilateral pleural effusions, on midodrine 1 L yesterday UF tolerated , does not appear volume overloaded with minimal p.o. intake.  Last O2 sat 97 % on room air yesterday none today AMS-chronic probable dementia, now with element of possible med with gabapentin possibly contributing.  Little to no p.o. intake, palliative care seeing per notes= another meeting today. Would advise comfort measures. Son is trying but pt does not want to eat. Dysphagia-work-up per admitting Elevated  troponin-per cardiology no chest pain or ischemic symptoms follow-up trend no further work-up Anemia -Hgb 12.5 no ESA noted was getting IV iron through 8/26 Secondary hyperparathyroidism -corrected calcium high holding Hectorol for now, 5.5 phosphorus no binders  FTT /Goals of care= palliative seeing patient with her multiple problems dysphagia ongoing abdominal pain advanced dementia= notes if patient has not improved over weekend, having meeting Monday 8/28 at 2:30 PM ="additional conversations about comfort focused care to be done. "  Subjective: Seen in dialysis. Patient just appears uncomfortable; refuses to answer questions even with a Optometrist.   Objective Vital signs in last 24 hours: Vitals:   03/02/22 0800 03/02/22 0830 03/02/22 0900 03/02/22 0912  BP: (!) 116/50 96/85 (!) 104/27 100/82  Pulse: 71 (!) 50 (!) 57   Resp: 11 (!) 24 14 17   Temp:      TempSrc:      SpO2: 99% 100% 100% 99%  Weight:      Height:       Weight change: -0.8 kg    Physical Exam: General: Elderly chronically ill female NAD Heart: RRR no MRG Lungs: CTA but decreased respiratory effort nonlabored breathing room air  Abdomen: NABS, soft, epigastric tenderness no rebound, no ascites, ND Extremities: No pedal edema Dialysis Access: Left upper arm AV Gore-Tex graft positive bruit, needles in place     Labs: Basic Metabolic Panel: Recent Labs  Lab 02/27/22 0453 02/28/22 0158 03/02/22 0650  NA 136 135 132*  K 3.2* 3.3* 4.1  CL 95* 94* 91*  CO2 30 27 26   GLUCOSE 188* 226* 181*  BUN 15 20 14   CREATININE 6.03* 7.10* 6.83*  CALCIUM 10.2 10.0 9.7  PHOS 5.7* 5.8* 5.0*   Liver Function Tests: Recent Labs  Lab 02/26/22 0925 02/27/22 0453 02/28/22 0158 03/02/22 0650  AST 46* 40 33  --   ALT 20 18 17   --   ALKPHOS 117 106 91  --   BILITOT 0.3 0.4 0.5  --   PROT 6.1* 6.5 6.3*  --   ALBUMIN 2.4* 2.4* 2.8* 2.7*   No results for input(s): "LIPASE", "AMYLASE" in the last 168 hours. No  results for input(s): "AMMONIA" in the last 168 hours. CBC: Recent Labs  Lab 02/25/22 0206 02/26/22 0925 02/27/22 0214 02/28/22 0158 03/02/22 0650  WBC 4.8 5.2 5.2 5.1 5.1  NEUTROABS 2.7  --  3.0 3.4  --   HGB 12.5 12.6 13.5 11.4* 12.6  HCT 37.6 38.8 42.4 34.6* 38.4  MCV 99.2 100.8* 101.2* 99.7 100.8*  PLT 190 155 118* 136* 128*   Cardiac Enzymes: No results for input(s): "CKTOTAL", "CKMB", "CKMBINDEX", "TROPONINI" in the last 168 hours. CBG: Recent Labs  Lab 02/28/22 2224 03/01/22 0723 03/01/22 1138 03/01/22 1946 03/02/22 0637  GLUCAP 180* 168* 160* 172* 199*    Studies/Results: No results found. Medications:  dextrose 30 mL/hr at 03/01/22 0211    (feeding supplement) PROSource Plus  30 mL Oral BID BM   feeding supplement  1 Container Oral TID BM   heparin  5,000 Units Subcutaneous Q8H   midodrine  5 mg Oral TID WC   oxyCODONE  10 mg Sublingual QID   thiamine (VITAMIN B1) injection  100 mg Intravenous Daily

## 2022-03-02 NOTE — Progress Notes (Signed)
Palliative:  HPI: 71 y.o. 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 Pt presents to ED with 2 week h/o reduced PO intake, diaphoresis over past 3-4 days and profuse diarrhea - GI consulted stool studies (-) though (+) colitis on BS antibiotics. (+) Dysphagia. Palliative care has been asked to get involved to further address goals of care in the setting of overalll decline and failure to thrive.     I met today with Sarah Kelley - no significant changes in status. I have spoken with Dr. Augustin Coupe about dialysis today. I met with Marciel and 2 of his sisters with tele Spanish interpreter. We discussed her poor tolerance of dialysis today. I explained that this is what I feared and that we are into territory where dialysis is more harmful to her than helpful. They continue to express concern that she is in pain and suffering and why we cannot control her pain. I spoke with them again about comfort care and liberalizing medication to keep her comfortable but they have to be accepting of the consequences of this focus in her care. I explained that the difficult part for [name]'s pain is that she is unable to tell us where she hurts or what type of pain or even if her distress is from confusion or fear of being in an unfamiliar place with unfamiliar people. I explained that this is due to her dementia and this is why it is more complicated to get her comfortable without truly shifting focus to full comfort care and accepting consequences of more medication for pain and anxiety. Marciel admits he would rather her sleep until the end instead of suffering as she is now. They express understanding. Marciel expresses more acceptance today that dialysis is no longer helping her and that her body is telling us this. They would like to take tonight to speak more as a family to ensure that everyone agrees with  shift to comfort care but family here today feel this is appropriate. They do not wish to make changes to care until they are able to speak tonight as a family. I offer another meeting tomorrow if they feel this will be helpful and they would like to reassess in the morning if this is needed - seems they are hopeful to come to a consensus tonight as a family.   All questions/concerns addressed. Emotional support provided. Updated medical team.   Exam: Awake but confused. Limited verbalization. Unable to communicate needs or follow commands. Thin, frail. Grimacing. Appears uncomfortable - tense. Breathing regular, unlabored. Cough still congested - likely aspirating secretions.   Plan: - Pain: Continue OxyIR 10 mg QID. Do not hold doses as family values her comfort as priority. OxyIR 5 mg every 4 hours PRN. Dilaudid 0.25 mg IV mcg IV every 2 hours PRN. If comfort care decided may need continuous infusion.  - Family seem more open to transition to comfort care and accepting of the harms of dialysis. They plan to discuss tonight. Will ask my colleague to follow up with them 9/1 am.   50 min  Vinie Sill, NP Palliative Medicine Team Pager [phone] (Please see [email or web address] for schedule) Team Phone [phone]    Greater than 50%  of this time was spent counseling and coordinating care related to the above assessment and plan

## 2022-03-02 NOTE — Progress Notes (Signed)
Patient had trouble swallowing oxycodone oral solution, may need re-evaluation for oral administration of medicines first.

## 2022-03-02 NOTE — Progress Notes (Signed)
Received patient in bed to unit.  Alert and oriented.  Informed consent signed and in chart.   Treatment initiated: 0731 Treatment completed: 1139  Patient tolerated well w/ soft bp on and off throughout tx.  Transported back to the room  Alert, without acute distress.  Hand-off given to patient's nurse.   Access used: AVF Access issues: NA  Total UF removed: 2054ml Medication(s) given: Heparin 2000 unit bolus, Albumin 25% 25g IVPB Post HD VS: 97.8    108/32    69    14    96% RA Post HD weight: 51.2kg    [facility]

## 2022-03-02 NOTE — Progress Notes (Signed)
Patient ID: Sarah Kelley, female   DOB: [birth year], 71 y.o.   MRN: [id number]   Night shift RN reported patient choking and becoming cyanotic when she attempted to give oral pain medication. She was unable to give morning dose of midodrine before dialysis as well. MD aware.  Haydee Salter, RN

## 2022-03-03 LAB — GLUCOSE, CAPILLARY
Glucose-Capillary: 192 mg/dL — ABNORMAL HIGH (ref 70–99)
Glucose-Capillary: 157 mg/dL — ABNORMAL HIGH (ref 70–99)
Glucose-Capillary: 125 mg/dL — ABNORMAL HIGH (ref 70–99)

## 2022-03-03 NOTE — Progress Notes (Addendum)
Palliative Medicine Inpatient Follow Up Note HPI: Sarah Kelley is a 71 y.o. female with medical history significant of ESRD on TTS dialysis, last had dialysis earlier today.  HTN, HLD, DM2 on insulin. Pt presents to ED with 2 week h/o reduced PO intake, diaphoresis over past 3-4 days and profuse diarrhea - GI consulted stool studies (-) though (+) colitis on BS antibiotics. (+) Dysphagia.    Palliative care has been asked to get involved to further address goals of care in the setting of overalll decline and failure to thrive.   Today's Discussion 03/03/2022  *Please note that this is a verbal dictation therefore any spelling or grammatical errors are due to the "[place] [employer]" system interpretation.  Chart reviewed inclusive of vital signs, progress notes, laboratory results, and diagnostic images. Per MAR review does appear to be receiving oxycodone concentrate ATC. This helping with painful symptoms.  I met at bedside with [facility] this morning, she was awake and alert to self. She was saying very little though appeared to be in no distress.   The Spanish interpretor, Graciela, myself, and patients son Lesly Dukes, daughter, Verdis Frederickson, [place], and other daughter Vicente Males met this afternoon.   We reviewed that family feels "pushed" to make a decision regarding the stopping of dialysis. They share that do not feel ready to make this decision and a provider came in this morning and said they "had to make a decision in the next day". They were very angry and vocalized a large degree of mistrust in the teams who are seeing them. I shared the events of yesterday's HD session. I shared that there is concern that [name] will soon not be able to tolerate HD and there is the option to either continue this or to change our focus to allow comfort and dignity at the end of her life.   Reviewed the role of Palliative care. Discussed that our goals are to aid the family in terms of complex decisions and the make  sure above all else we are advocating for what the patient would want for themselves. I re-iterated that it is very clear that [name] is getting closer to the end of her life and we need to consider what she would want for herself and not what each of the children want. I offered empathy recognizing that the loss of a parent is a very harsh reality.   I shared that this is the opportunity to either allow patient comfort or to continue placing her through invasive measures that unfortunately are not going to change the outcome and may cause more harm and suffering. Patients family is mostly divided on this decision. Children defer to [facility] who vocalizes the desire to see how tomorrows dialysis session goes and to make further decisions from there.   We reviewed patient diminished PO intake, her lack of mobility, and her declining cognitive state. We reviewed patients soft blood pressures.   Family would like to see how [name] does in HD tomorrow. I shared that I would arrange for them to be present if possible to better understand the process.   Questions and concerns addressed/Palliative Support Provided.   Per nephrology HD will be completed at bedside tomorrow around 1PM.   Objective Assessment: Vital Signs Vitals:   03/03/22 0456 03/03/22 0859  BP: (!) 92/30 (!) 90/34  Pulse: 60   Resp: 14 15  Temp: 98.7 F (37.1 C)   SpO2: 93%     Intake/Output Summary (Last 24 hours) at  03/03/2022 1444 Last data filed at 03/02/2022 1737 Gross per 24 hour  Intake 50 ml  Output --  Net 50 ml    Last Weight  Most recent update: 03/02/2022 11:41 AM    Weight  51.2 kg (112 lb 14 oz)            Gen: Elderly Hispanic female in no acute distress HEENT: moist mucous membranes CV: Regular rate and rhythm PULM: On room air even and nonlabored ABD: soft/nontender/nondistended EXT: No edema Neuro: Alert and oriented to self  SUMMARY OF RECOMMENDATIONS   DNAR/DNI   Family would like to see how  patient tolerates HD tomorrow --> Ambivalent to make a decision. Plan for HD to be at bedside tomorrow afternoon so family may get the opportunity to see what the patient goes through and witness her inatbility   Ongoing palliative care support  Symptom Management:  Abdominal Pain: Continue OxyIR 10 mg QID OxyIR 5 mg every 4 hours PRN Dilaudid 0.25 Q4H PRN Severe pain  Total Time: 72 Billing based on MDM: High  Problems Addressed: One acute or chronic illness or injury that poses a threat to life or bodily function  Amount and/or Complexity of Data: Category 3:Discussion of management or test interpretation with external physician/other qualified health care professional/appropriate source (not separately reported)  Risks: Parenteral controlled substances, Decision regarding hospitalization or escalation of hospital care, and Decision not to resuscitate or to de-escalate care because of poor prognosis ______________________________________________________________________________________ [place] Team Team Cell Phone: [phone] Please utilize secure chat with additional questions, if there is no response within 30 minutes please call the above phone number  Palliative Medicine Team providers are available by phone from 7am to 7pm daily and can be reached through the team cell phone.  Should this patient require assistance outside of these hours, please call the patient's attending physician.

## 2022-03-03 NOTE — Progress Notes (Signed)
PROGRESS NOTE    Sarah Kelley  BHA:[id number] DOB: [birth year] DOA: 02/18/2022 PCP: [name], MD  Chief Complaint  Patient presents with   Failure To Thrive    Brief Narrative:  Sarah Kelley is a 71 y.o. female with medical history significant of ESRD on TTS dialysis, HTN, diabetes, HLD who presented with 2 weeks decreased PO intake, diarrhea, abdominal discomfort, globus sensation.  Imaging at presentation concerning for colitis/enteritis.  She's been treated with IV abx and has received dialysis while she's been here.  GI and SLP eval.  She has not improved despite treatment.  At this time, palliative conversations ongoing, we've recommended comfort measures.    Assessment & Plan:   Principal Problem:   Enterocolitis Active Problems:   Dysphagia   Goals of care, counseling/discussion   ESRD (end stage renal disease) (HCC)   Elevated troponin   Essential hypertension   Type 2 diabetes mellitus with chronic kidney disease on chronic dialysis, with long-term current use of insulin (HCC)   Hypothyroidism   Bilateral pleural effusion   Pain   Dementia without behavioral disturbance (HCC)   Hypokalemia   Malnutrition of moderate degree   Assessment and Plan: * Enterocolitis Presumed infectious initially on admission, with frequent diarrhea --C. difficile pathogen panel negative completed ceftriaxone Flagyl Small layering stones on CT abdomen pelvis felt insignificant and GI signed off  Goals of care, counseling/discussion Will c/s palliative care to help start [place] conversations Appreciate assistance, she's been transitioned to DNR/DNI.  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 Sugars ranging 1 50-1 90, continue D10 at 30 cc/at bedtime not eating  Essential hypertension -fluctuating bP, intermittent hypotension--periodically getting albumin at HD -Continue to hold BP medications. -continue midodrine 5 3 times daily  Pain Unclear cause, see below Delirium? Fentanyl prn ordered Appreciate palliative care assistance with pain management Will continue to monitor  Bilateral pleural effusion Repeat CXR 8/24 with mild bilateral effusion, increased mild central pulm vascular congestion CT with moderate R and small L effusions on 8/27 -> suspected initially due to volume, though renal doesn't think she's overloaded  Hypokalemia - Likely in part secondary to GI losses. -Defer repletion to nephrology as patient ESRD on HD.  Dementia without behavioral disturbance ([place]) Delirium precautions She's delirious, with some intermittent episodes of clarity Her lack of PO intake is also likely related to her dementia      DVT prophylaxis: heparin Code Status: full Family Communication: Long discussion with son [name] on phone with help of Delice Lesch interpreter He understands she is sick-they are waiting until dialysis tomorrow to continue palliative conversations I have seen the patient and every day there  is a separate different family member  in the room He is a point of contact Disposition:   Status is: Inpatient Remains inpatient appropriate because: need for continued management   Consultants:  Renal GI  Procedures:  none  Antimicrobials:  Anti-infectives (From admission, onward)    Start     Dose/Rate Route Frequency Ordered Stop   02/27/22 2200  cefTRIAXone (ROCEPHIN) 2 g in sodium chloride 0.9 % 100 mL IVPB        2 g 200 mL/hr over 30 Minutes Intravenous Every 24 hours 02/27/22 1814 02/28/22 2250   02/18/22 2230  cefTRIAXone (ROCEPHIN) 2 g in sodium chloride 0.9 % 100 mL IVPB  Status:  Discontinued        2 g 200 mL/hr over 30 Minutes Intravenous Every 24 hours 02/18/22 2217 02/27/22 1814   02/18/22 2230  metroNIDAZOLE (FLAGYL) IVPB 500 mg  Status:  Discontinued        500 mg 100 mL/hr over 60 Minutes Intravenous Every 12 hours 02/18/22 2217 02/28/22 1322       Subjective:  Frail --engaging with family--overall seems more refractive than prior-becomes distraught and starts moaning when I started talking with her  Objective: Vitals:   03/02/22 1208 03/02/22 2109 03/03/22 0456 03/03/22 0859  BP: (!) 119/49 (!) 100/45 (!) 92/30 (!) 90/34  Pulse: 69  60   Resp: 16 13 14 15   Temp:  98.7 F (37.1 C) 98.7 F (37.1 C)   TempSrc:  Axillary Axillary   SpO2: 98% 93% 93%   Weight:      Height:       No intake or output data in the 24 hours ending 03/03/22 1738  Filed Weights   03/02/22 0340 03/02/22 0717 03/02/22 1139  Weight: 55.4 kg 53.7 kg 51.2 kg    Examination:  Cachectic no icterus no pallor arcus analysis Abdomen scaphoid Chest clear S1-S2 no murmur No lower extremity edema Weak not moving much and does not follow commands  Data Reviewed: I have personally reviewed following labs and imaging studies  CBC: Recent Labs  Lab 02/25/22 0206 02/26/22 0925 02/27/22 0214 02/28/22 0158 03/02/22 0650  WBC 4.8 5.2 5.2 5.1 5.1  NEUTROABS 2.7  --  3.0 3.4  --   HGB 12.5 12.6 13.5 11.4*  12.6  HCT 37.6 38.8 42.4 34.6* 38.4  MCV 99.2 100.8* 101.2* 99.7 100.8*  PLT 190 155 118* 136* 128*     Basic Metabolic Panel: Recent Labs  Lab 02/25/22 0206 02/26/22 0925 02/27/22 0453 02/28/22 0158 03/02/22 0650  NA 139 134* 136 135 132*  K 3.1* 3.6 3.2* 3.3* 4.1  CL 101 97* 95* 94* 91*  CO2 26 26 30 27 26   GLUCOSE 112* 233* 188* 226* 181*  BUN 17 13 15 20 14   CREATININE 5.96* 4.87* 6.03* 7.10* 6.83*  CALCIUM 9.9 9.9 10.2 10.0 9.7  MG 1.8  --  1.8 1.9  --   PHOS 5.5*  --  5.7* 5.8* 5.0*     GFR: Estimated Creatinine Clearance: 5.3 mL/min (A) (by C-G formula based on SCr of 6.83 mg/dL (H)).  Liver Function Tests: Recent Labs  Lab 02/25/22 0206 02/26/22 0925 02/27/22 0453 02/28/22 0158 03/02/22 0650  AST 41 46* 40 33  --   ALT 20 20 18 17   --   ALKPHOS 110 117 106 91  --   BILITOT 0.6 0.3 0.4 0.5  --   PROT 6.7 6.1* 6.5 6.3*  --   ALBUMIN 2.5*  2.4* 2.4* 2.8* 2.7*     CBG: Recent Labs  Lab 03/02/22 1210 03/02/22 1618 03/02/22 2124 03/03/22 0746 03/03/22 1117  GLUCAP 146* 175* 176* 192* 157*      No results found for this or any previous visit (from the past 240 hour(s)).        Radiology Studies: No results found.      Scheduled Meds:  (feeding supplement) PROSource Plus  30 mL Oral BID BM   feeding supplement  1 Container Oral TID BM   heparin  5,000 Units Subcutaneous Q8H   midodrine  5 mg Oral TID WC   oxyCODONE  10 mg Sublingual QID   thiamine (VITAMIN B1) injection  100 mg Intravenous Daily   Continuous Infusions:  dextrose 30 mL/hr at 03/02/22 1234     LOS: 12 days    Time spent: [place], MD [facility]   To contact the attending provider between 7A-7P or the covering provider during after hours 7P-7A, please log into the web site [email or web address] and access using universal [place] password for that web site. If you do not have the password, please call the hospital operator.  03/03/2022,  5:38 PM

## 2022-03-03 NOTE — Progress Notes (Signed)
Dialysis Orders: [facility] 4h  400/500  52kg  2/2 baht  P2  Heparin 2000 LUA AVG - hep B labs here: pend - last HD 8/19, post wt 53.3kg, last wk edw lowered 3kg - last Hb 11.8 on 8/15, tsat 26%, no esa - iron sucrose IV 100 q hd thru 8/26 - doxercalciferol 5 ug IV tiw   Problem/Plan: Enterocolitis with diarrhea and poor p.o. intake.  Empiric IV Rocephin and Flagyl per admit team, GI panel negative GI consulted, little to no n.p.o. intake ESRD -HD TTS HD, on schedule, had discussion with son a few times. Multiple family meetings with other siblings. No change and the dialysis is really not improving QOL and is an aggressive measure almost certainly making things more difficult for the pt and family (ie transport, being on hd, not feeling well after hd).   Pt rx HD on 8/31 BP was only 88/36 requiring 123ml bolus We were able to remove 2L of fluid out of goal of 3-4L; limited bec of hypotension and I am concerned that with required UF she is getting hypoperfusion to other vital organs. She's not able or refuses to swallow precluding Midodrine.  She is a borderline candidate at best for dialysis at this time and with the inability to ultrafiltrate and remove fluid she will not tolerate dialysis. Nothing has changed from the renal standpoint; the pt is not doing well with RRT with hypotension limiting fluid removal. HD certainly not improving her QOL by any means. Rec from renal standpoint is to move toward comfort measures and stop iHD.  Will await further palliative discussions for direction.  HTN/volume -BP stable on HD bilateral pleural effusions, on midodrine 1 L yesterday UF tolerated , does not appear volume overloaded with minimal p.o. intake.  Last O2 sat 97 % on room air yesterday none today AMS-chronic probable dementia, now with element of possible med with gabapentin possibly contributing.  Little to no p.o. intake, palliative care seeing per notes= another meeting today. Would advise  comfort measures. Son is trying but pt does not want to eat. Dysphagia-work-up per admitting Elevated troponin-per cardiology no chest pain or ischemic symptoms follow-up trend no further work-up Anemia -Hgb 12.5 no ESA noted was getting IV iron through 8/26 Secondary hyperparathyroidism -corrected calcium high holding Hectorol for now, 5.5 phosphorus no binders  FTT /Goals of care= palliative seeing patient with her multiple problems dysphagia ongoing abdominal pain advanced dementia= notes if patient has not improved over weekend, having meeting Monday 8/28 at 2:30 PM ="additional conversations about comfort focused care to be done. "  Subjective: Seen in room w/ SIL present. Patient just appears uncomfortable; refuses to answer questions even with a Optometrist. I removed the bandages from her LUA access   Objective Vital signs in last 24 hours: Vitals:   03/02/22 1208 03/02/22 2109 03/03/22 0456 03/03/22 0859  BP: (!) 119/49 (!) 100/45 (!) 92/30 (!) 90/34  Pulse: 69  60   Resp: 16 13 14 15   Temp:  98.7 F (37.1 C) 98.7 F (37.1 C)   TempSrc:  Axillary Axillary   SpO2: 98% 93% 93%   Weight:      Height:       Weight change: -1.7 kg    Physical Exam: General: Elderly chronically ill female NAD Heart: RRR no MRG Lungs: CTA but decreased respiratory effort nonlabored breathing room air  Abdomen: NABS, soft, epigastric tenderness no rebound, no ascites, ND Extremities: No pedal edema Dialysis Access: Left upper  arm AV Gore-Tex graft positive bruit     Labs: Basic Metabolic Panel: Recent Labs  Lab 02/27/22 0453 02/28/22 0158 03/02/22 0650  NA 136 135 132*  K 3.2* 3.3* 4.1  CL 95* 94* 91*  CO2 30 27 26   GLUCOSE 188* 226* 181*  BUN 15 20 14   CREATININE 6.03* 7.10* 6.83*  CALCIUM 10.2 10.0 9.7  PHOS 5.7* 5.8* 5.0*   Liver Function Tests: Recent Labs  Lab 02/26/22 0925 02/27/22 0453 02/28/22 0158 03/02/22 0650  AST 46* 40 33  --   ALT 20 18 17   --   ALKPHOS  117 106 91  --   BILITOT 0.3 0.4 0.5  --   PROT 6.1* 6.5 6.3*  --   ALBUMIN 2.4* 2.4* 2.8* 2.7*   No results for input(s): "LIPASE", "AMYLASE" in the last 168 hours. No results for input(s): "AMMONIA" in the last 168 hours. CBC: Recent Labs  Lab 02/25/22 0206 02/26/22 0925 02/27/22 0214 02/28/22 0158 03/02/22 0650  WBC 4.8 5.2 5.2 5.1 5.1  NEUTROABS 2.7  --  3.0 3.4  --   HGB 12.5 12.6 13.5 11.4* 12.6  HCT 37.6 38.8 42.4 34.6* 38.4  MCV 99.2 100.8* 101.2* 99.7 100.8*  PLT 190 155 118* 136* 128*   Cardiac Enzymes: No results for input(s): "CKTOTAL", "CKMB", "CKMBINDEX", "TROPONINI" in the last 168 hours. CBG: Recent Labs  Lab 03/02/22 0637 03/02/22 1210 03/02/22 1618 03/02/22 2124 03/03/22 0746  GLUCAP 199* 146* 175* 176* 192*    Studies/Results: No results found. Medications:  dextrose 30 mL/hr at 03/02/22 1234    (feeding supplement) PROSource Plus  30 mL Oral BID BM   feeding supplement  1 Container Oral TID BM   heparin  5,000 Units Subcutaneous Q8H   midodrine  5 mg Oral TID WC   oxyCODONE  10 mg Sublingual QID   thiamine (VITAMIN B1) injection  100 mg Intravenous Daily

## 2022-03-04 LAB — GLUCOSE, CAPILLARY
Glucose-Capillary: 136 mg/dL — ABNORMAL HIGH (ref 70–99)
Glucose-Capillary: 145 mg/dL — ABNORMAL HIGH (ref 70–99)
Glucose-Capillary: 154 mg/dL — ABNORMAL HIGH (ref 70–99)
Glucose-Capillary: 114 mg/dL — ABNORMAL HIGH (ref 70–99)

## 2022-03-04 NOTE — Evaluation (Signed)
Clinical/Bedside Swallow Evaluation Patient Details  Name: Sarah Kelley MRN: [id number] Date of Birth: 1951/02/03  Today's Date: 03/04/2022 Time: SLP Start Time (ACUTE ONLY): 54 SLP Stop Time (ACUTE ONLY): 5625 SLP Time Calculation (min) (ACUTE ONLY): 35 min  Past Medical History:  Past Medical History:  Diagnosis Date   Dementia without behavioral disturbance ([place]) 02/20/2022   Diabetes mellitus    ESRD (end stage renal disease) ([place])    dialysis T/Th/Sa   GERD (gastroesophageal reflux disease)    Hyperlipidemia    Hypertension    Iron deficiency anemia    MRSA infection    hx of   Neuropathy    Diabetic   Pneumonia 07/2016   Seizure-like activity ([place])    Sepsis ([place])    Thyroid disease    Wears dentures    Past Surgical History:  Past Surgical History:  Procedure Laterality Date   AMPUTATION Left 05/21/2015   Procedure: Left Foot 3rd Ray Amputation;  Surgeon: Newt Minion, MD;  Location: [facility];  Service: Orthopedics;  Laterality: Left;   AV FISTULA PLACEMENT Left 03/28/2016   Procedure: ARTERIOVENOUS (AV) FISTULA CREATION LEFT ARM;  Surgeon: Waynetta Sandy, MD;  Location: [facility];  Service: Vascular;  Laterality: Left;   AV FISTULA PLACEMENT Right 06/04/2019   Procedure: ARTERIOVENOUS (AV) FISTULA CREATION RIGHT ARM;  Surgeon: Waynetta Sandy, MD;  Location: [facility];  Service: Vascular;  Laterality: Right;   AV FISTULA PLACEMENT Left 03/11/2021   Procedure: INSERTION OF LEFT ARM ARTERIOVENOUS (AV) GORE-TEX GRAFT;  Surgeon: Waynetta Sandy, MD;  Location: [facility];  Service: Vascular;  Laterality: Left;   DILATION AND CURETTAGE OF UTERUS     EYE SURGERY     FISTULA SUPERFICIALIZATION Left 09/04/2016   Procedure: FISTULA SUPERFICIALIZATION LEFT UPPER ARM;  Surgeon: Waynetta Sandy, MD;  Location: [facility];  Service: Vascular;  Laterality: Left;   FISTULA SUPERFICIALIZATION Right 10/08/2019   Procedure: FISTULA SUPERFICIALIZATION OR RIGHT  ARM;  Surgeon: Waynetta Sandy, MD;  Location: [facility];  Service: Vascular;  Laterality: Right;   IR AV DIALY SHUNT INTRO NEEDLE/INTRAC INITIAL W/PTA/STENT/IMG LT Left 06/13/2017   IR AV DIALY SHUNT INTRO NEEDLE/INTRAC INITIAL W/PTA/STENT/IMG LT Left 09/12/2017   IR AV DIALY SHUNT INTRO NEEDLE/INTRACATH INITIAL W/PTA/IMG LEFT  11/10/2016   IR AV DIALY SHUNT INTRO NEEDLE/INTRACATH INITIAL W/PTA/IMG LEFT  03/09/2017   IR AV DIALY SHUNT INTRO NEEDLE/INTRACATH INITIAL W/PTA/IMG LEFT  12/12/2017   IR AV DIALY SHUNT INTRO NEEDLE/INTRACATH INITIAL W/PTA/IMG LEFT  02/27/2018   IR AV DIALY SHUNT INTRO NEEDLE/INTRACATH INITIAL W/PTA/IMG LEFT  06/05/2018   IR AV DIALY SHUNT INTRO NEEDLE/INTRACATH INITIAL W/PTA/IMG LEFT  09/11/2018   IR AV DIALY SHUNT INTRO NEEDLE/INTRACATH INITIAL W/PTA/IMG LEFT  12/26/2021   IR AV DIALY SHUNT INTRO NEEDLE/INTRACATH INITIAL W/PTA/IMG RIGHT Right 04/21/2020   IR AV DIALY SHUNT INTRO NEEDLE/INTRACATH INITIAL W/PTA/IMG RIGHT Right 10/08/2020   IR DIALY SHUNT INTRO NEEDLE/INTRACATH INITIAL W/IMG LEFT Left 05/08/2018   IR DIALY SHUNT INTRO NEEDLE/INTRACATH INITIAL W/IMG LEFT Left 06/17/2021   IR FLUORO GUIDE CV LINE RIGHT  02/01/2019   IR FLUORO GUIDE CV LINE RIGHT  07/14/2020   IR FLUORO GUIDE CV LINE RIGHT  12/29/2020   IR GENERIC HISTORICAL  08/01/2016   IR FLUORO GUIDE CV LINE RIGHT 08/01/2016 [name], MD [facility]   IR GENERIC HISTORICAL  08/01/2016   IR US GUIDE VASC ACCESS RIGHT 08/01/2016 [name], MD [facility]   IR REMOVAL TUN CV  CATH W/O FL  11/01/2016   IR REMOVAL TUN CV CATH W/O FL  01/02/2020   IR REMOVAL TUN CV CATH W/O FL  05/04/2021   IR THROMBECTOMY AV FISTULA W/THROMBOLYSIS INC/SHUNT/IMG LEFT Left 01/31/2019   IR THROMBECTOMY AV FISTULA W/THROMBOLYSIS/PTA INC/SHUNT/IMG LEFT Left 01/27/2019   IR THROMBECTOMY AV FISTULA W/THROMBOLYSIS/PTA INC/SHUNT/IMG LEFT Left 07/16/2020   IR THROMBECTOMY AV FISTULA W/THROMBOLYSIS/PTA/STENT INC/SHUNT/IMG RT Right 09/24/2020    IR US GUIDE VASC ACCESS LEFT  11/10/2016   IR US GUIDE VASC ACCESS LEFT  09/12/2017   IR US GUIDE VASC ACCESS LEFT  12/12/2017   IR US GUIDE VASC ACCESS LEFT  06/05/2018   IR US GUIDE VASC ACCESS LEFT  09/11/2018   IR US GUIDE VASC ACCESS LEFT  01/27/2019   IR US GUIDE VASC ACCESS LEFT  02/01/2019   IR US GUIDE VASC ACCESS LEFT  12/26/2021   IR US GUIDE VASC ACCESS RIGHT  02/01/2019   IR US GUIDE VASC ACCESS RIGHT  07/14/2020   IR US GUIDE VASC ACCESS RIGHT  07/16/2020   IR US GUIDE VASC ACCESS RIGHT  10/08/2020   IR US GUIDE VASC ACCESS RIGHT  12/29/2020   PERIPHERAL VASCULAR BALLOON ANGIOPLASTY Left 07/22/2021   Procedure: PERIPHERAL VASCULAR BALLOON ANGIOPLASTY;  Surgeon: Cherre Robins, MD;  Location: [facility];  Service: Cardiovascular;  Laterality: Left;   UPPER EXTREMITY VENOGRAPHY Bilateral 02/28/2021   Procedure: UPPER EXTREMITY VENOGRAPHY;  Surgeon: Waynetta Sandy, MD;  Location: [facility];  Service: Cardiovascular;  Laterality: Bilateral;   HPI:  Pt is a 71 y.o. female who presented secondary to poor p.o. intake. Per H&P, pt "feels like food gets stuck in throat". Suspected to be esophageal and GI consulted, but SLP also consulted for oropharyngeal assessment. BSE (02/19/22) no s/sx of aspiration with solids or liquids. Clear liquids diet initiated per GI recommendation, SLP signed-off at that time. Esophagram (8/23) revealed "Age-related esophageal dysmotility with poor primary contractions an abnormal disordered tertiary contractions with delayed passage of barium. No mass or stricture". LPN note (2/54) states "patient is having difficulty swallowing crushed medication in apple sauce. Patient is screaming not able to be consoled by family". Pt mentation deteriorated, but improved and  BSE re-ordered. PMH: ESRD on TTS dialysis, HTN, HLD, GERD, DM2 on insulin.    Assessment / Plan / Recommendation  Clinical Impression  Pt alert and attentive to environment, verbally  responsive to family, but still requiring cues and confusion at times. Pt will accept sips of water eagerly as well as ice cream, puree and bites of crackers. Oral manipulation of solids is discoordinated given few teeth. Pt struggled with a whole pill, unable to transit without puree bolus. Pt had some gagging and coughing during attempts.  RN will crush meds in ice cream or pudding (pt doesnt like applesauce). Now that mentation has improved pt is able to tolerate thin liquids and purees, though risk of aspiration and regurgitation are still present given history of esophageal dysphagia. Offered basic strategies and food choices to family. Will f/u. SLP Visit Diagnosis: Dysphagia, unspecified (R13.10)    Aspiration Risk  Moderate aspiration risk;Risk for inadequate nutrition/hydration    Diet Recommendation Dysphagia 1 (Puree);Thin liquid   Liquid Administration via: Cup;Straw Medication Administration: Crushed with puree Supervision: Staff to assist with self feeding Compensations: Slow rate;Small sips/bites;Minimize environmental distractions Postural Changes: Seated upright at 90 degrees;Remain upright for at least 30 minutes after po intake    Other  Recommendations Oral  Care Recommendations: Oral care BID Other Recommendations: Have oral suction available    Recommendations for follow up therapy are one component of a multi-disciplinary discharge planning process, led by the attending physician.  Recommendations may be updated based on patient status, additional functional criteria and insurance authorization.  Follow up Recommendations Home health SLP      Assistance Recommended at Discharge Frequent or constant Supervision/Assistance  Functional Status Assessment Patient has had a recent decline in their functional status and demonstrates the ability to make significant improvements in function in a reasonable and predictable amount of time.  Frequency and Duration min 2x/week  2  weeks       Prognosis        Swallow Study   General HPI: Pt is a 71 y.o. female who presented secondary to poor p.o. intake. Per H&P, pt "feels like food gets stuck in throat". Suspected to be esophageal and GI consulted, but SLP also consulted for oropharyngeal assessment. BSE (02/19/22) no s/sx of aspiration with solids or liquids. Clear liquids diet initiated per GI recommendation, SLP signed-off at that time. Esophagram (8/23) revealed "Age-related esophageal dysmotility with poor primary contractions an abnormal disordered tertiary contractions with delayed passage of barium. No mass or stricture". LPN note (5/37) states "patient is having difficulty swallowing crushed medication in apple sauce. Patient is screaming not able to be consoled by family". Pt mentation deteriorated, but improved and  BSE re-ordered. PMH: ESRD on TTS dialysis, HTN, HLD, GERD, DM2 on insulin. Type of Study: Bedside Swallow Evaluation Previous Swallow Assessment: see HPI Diet Prior to this Study: NPO History of Recent Intubation: No Behavior/Cognition: Alert;Cooperative;Confused;Requires cueing Oral Cavity Assessment: Within Functional Limits Oral Care Completed by SLP: No Oral Cavity - Dentition: Missing dentition;Poor condition Vision: Impaired for self-feeding Self-Feeding Abilities: Needs set up Patient Positioning: Upright in bed Baseline Vocal Quality: Normal Volitional Cough: Cognitively unable to elicit Volitional Swallow: Unable to elicit    Oral/Motor/Sensory Function Overall Oral Motor/Sensory Function: Within functional limits   Ice Chips Ice chips: Not tested   Thin Liquid Thin Liquid: Impaired Presentation: Straw Pharyngeal  Phase Impairments: Cough - Delayed    Nectar Thick Nectar Thick Liquid: Not tested   Honey Thick Honey Thick Liquid: Not tested   Puree Puree: Within functional limits   Solid     Solid: Impaired Oral Phase Impairments: Impaired mastication Oral Phase Functional  Implications: Impaired mastication Pharyngeal Phase Impairments: Cough - Delayed      [place], Katherene Ponto 03/04/2022,2:26 PM

## 2022-03-04 NOTE — Progress Notes (Signed)
PROGRESS NOTE    Sarah Kelley  ZOX:[id number] DOB: 1950/09/28 DOA: 02/18/2022 PCP: [name], MD  Chief Complaint  Patient presents with   Failure To Thrive    Brief Narrative:  71 y.o. female with medical history significant of ESRD on TTS dialysis, HTN, diabetes, HLD who presented with 2 weeks decreased PO intake, diarrhea, abdominal discomfort, globus sensation.  Imaging at presentation concerning for colitis/enteritis.  She's been treated with IV abx and has received dialysis while she's been here.  GI and SLP eval.   Palliative care was engaged as she was not improving throughout hospital course She became more responsive on 9/1 and was started on a diet on 9/2    Assessment & Plan:   Principal Problem:   Enterocolitis Active Problems:   Dysphagia   Goals of care, counseling/discussion   ESRD (end stage renal disease) ([facility])   Elevated troponin   Essential hypertension   Type 2 diabetes mellitus with chronic kidney disease on chronic dialysis, with long-term current use of insulin (HCC)   Hypothyroidism   Bilateral pleural effusion   Pain   Dementia without behavioral disturbance ([facility])   Hypokalemia   Malnutrition of moderate degree   Assessment and Plan: * Enterocolitis Presumed infectious initially on admission, with frequent diarrhea --C. 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 GI consulted, recommend adjusting diet to foods/beverages she tolerates Mentation improved some although still high risk for aspiration-placed on dysphagia 1 on 9/2 simplify as much as possible oral meds and she will need to take them with ice cream/pudding  Elevated troponin Cards indicated no work-up and flat troponins not consistent with ACS-echo grade 1 DD with EF 55% with no wall motion  abnormality  ESRD (end stage renal disease) (HCC) Appears to be possibly volume overloaded with mod B pleural effusions on CT scan despite recent GI illness with diarrhea, poor PO intake It has been difficult to manage during dialysis and she becomes hypotensive and needs IV albumin and fluids--she is also on midodrine 5 mg 3 times daily Nephrology discussed with the patient that dialysis may not be a good long-term solution- Family was able to see how she did on dialysis and felt she did fair although she needed a bolus of fluid on 9/2  Hypothyroidism TSH wnl with elevated free T4, unclear significance --  Type 2 diabetes mellitus with chronic kidney disease on chronic dialysis, with long-term current use of insulin ([place]) -Hemoglobin A1c 7.7  (02/18/2022 ). Sugars ranging 125-157 continue D10 at 30 cc/at bedtime not eating If eating in a.m. we will discontinue D10 and observe  Essential hypertension -fluctuating bP, intermittent hypotension--periodically getting albumin at HD -Continue to hold BP medications. -continue midodrine 5 3 times daily  Pain Unclear cause, see below Delirium? Fentanyl prn ordered, discontinued all opiates to ensure mentation clears  Bilateral pleural effusion Repeat CXR 8/24 with mild bilateral effusion, increased mild central pulm vascular congestion CT with moderate R and small L effusions on 8/27 -> suspected initially due to volume, though renal doesn't think she's overloaded  Hypokalemia - Likely in part secondary to GI losses, resolved  Goals of care, counseling/discussion Will c/s palliative care to help start [place] conversations Appreciate assistance, she's been transitioned to DNR/DNI.  Several meetings and to be seen with family at HD to see how she does  Dementia without behavioral disturbance ([place])  Delirium precautions--has had some improvement     DVT prophylaxis: heparin Code Status: full Family Communication:  Multiple family  members present at bedside when I evaluated--discussed with power of attorney [place].  Discussed that she appears to have improved but that this may be waxing and waning and intermittent only and she may ultimately decline again we will see how she does in the next several days Disposition:   Status is: Inpatient Remains inpatient appropriate because: need for continued management   Consultants:  Renal GI  Procedures:  none  Antimicrobials:  Anti-infectives (From admission, onward)    Start     Dose/Rate Route Frequency Ordered Stop   02/27/22 2200  cefTRIAXone (ROCEPHIN) 2 g in sodium chloride 0.9 % 100 mL IVPB        2 g 200 mL/hr over 30 Minutes Intravenous Every 24 hours 02/27/22 1814 02/28/22 2250   02/18/22 2230  cefTRIAXone (ROCEPHIN) 2 g in sodium chloride 0.9 % 100 mL IVPB  Status:  Discontinued        2 g 200 mL/hr over 30 Minutes Intravenous Every 24 hours 02/18/22 2217 02/27/22 1814   02/18/22 2230  metroNIDAZOLE (FLAGYL) IVPB 500 mg  Status:  Discontinued        500 mg 100 mL/hr over 60 Minutes Intravenous Every 12 hours 02/18/22 2217 02/28/22 1322       Subjective:  More engaging No chest pain no fever Actually was speaking today Moving limbs and seems to have more purposeful interactions with multiple family members and tries to interact with me as well  Objective: Vitals:   03/04/22 1210 03/04/22 1215 03/04/22 1221 03/04/22 1300  BP: (!) 83/39 (!) 87/38 (!) 110/45   Pulse:      Resp:  13    Temp:  (!) 96.9 F (36.1 C)    TempSrc:  Axillary    SpO2:      Weight:    53.1 kg  Height:        Intake/Output Summary (Last 24 hours) at 03/04/2022 1534 Last data filed at 03/04/2022 1221 Gross per 24 hour  Intake --  Output 1340 ml  Net -1340 ml    Filed Weights   03/02/22 1139 03/04/22 0815 03/04/22 1300  Weight: 51.2 kg 54.5 kg 53.1 kg    Examination:   no icterus no pallor arcus senilis, cachectic Abdomen scaphoid Chest clear S1-S2 no  murmur No lower extremity edema Did not examine skin  Data Reviewed: I have personally reviewed following labs and imaging studies  CBC: Recent Labs  Lab 02/26/22 0925 02/27/22 0214 02/28/22 0158 03/02/22 0650  WBC 5.2 5.2 5.1 5.1  NEUTROABS  --  3.0 3.4  --   HGB 12.6 13.5 11.4* 12.6  HCT 38.8 42.4 34.6* 38.4  MCV 100.8* 101.2* 99.7 100.8*  PLT 155 118* 136* 128*     Basic Metabolic Panel: Recent Labs  Lab 02/26/22 0925 02/27/22 0453 02/28/22 0158 03/02/22 0650  NA 134* 136 135 132*  K 3.6 3.2* 3.3* 4.1  CL 97* 95* 94* 91*  CO2 26 30 27 26   GLUCOSE 233* 188* 226* 181*  BUN 13 15 20 14   CREATININE 4.87* 6.03* 7.10* 6.83*  CALCIUM 9.9 10.2 10.0 9.7  MG  --  1.8 1.9  --   PHOS  --  5.7* 5.8* 5.0*     GFR: Estimated Creatinine Clearance: 5.3 mL/min (A) (by C-G formula based on SCr of 6.83 mg/dL (H)).  Liver Function Tests: Recent Labs  Lab 02/26/22 0925 02/27/22 0453 02/28/22 0158 03/02/22 0650  AST 46* 40 33  --   ALT 20 18 17   --   ALKPHOS 117 106 91  --   BILITOT 0.3 0.4 0.5  --   PROT 6.1* 6.5 6.3*  --   ALBUMIN 2.4* 2.4* 2.8* 2.7*     Radiology Studies: No results found.  Scheduled Meds:  (feeding supplement) PROSource Plus  30 mL Oral BID BM   feeding supplement  1 Container Oral TID BM   heparin  5,000 Units Subcutaneous Q8H   midodrine  5 mg Oral TID WC   oxyCODONE  10 mg Sublingual QID   thiamine (VITAMIN B1) injection  100 mg Intravenous Daily   Continuous Infusions:  dextrose 30 mL/hr at 03/04/22 0923     LOS: 13 days    Time spent: [place], MD [facility]   To contact the attending provider between 7A-7P or the covering provider during after hours 7P-7A, please log into the web site [email or web address] and access using universal [facility] password for that web site. If you do not have the password, please call the hospital operator.  03/04/2022, 3:34 PM

## 2022-03-04 NOTE — Plan of Care (Signed)
  Problem: Metabolic: Goal: Ability to maintain appropriate glucose levels will improve Outcome: Progressing   Problem: Skin Integrity: Goal: Risk for impaired skin integrity will decrease Outcome: Progressing   Problem: Activity: Goal: Risk for activity intolerance will decrease Outcome: Progressing

## 2022-03-04 NOTE — Progress Notes (Signed)
Dialysis Orders: [facility]  400/500  52kg  2/2 baht  P2  Heparin 2000 LUA AVG - hep B labs here: pend - last HD 8/19, post wt 53.3kg, last wk edw lowered 3kg - last Hb 11.8 on 8/15, tsat 26%, no esa - iron sucrose IV 100 q hd thru 8/26 - doxercalciferol 5 ug IV tiw   Problem/Plan: Enterocolitis with diarrhea and poor p.o. intake.  Empiric IV Rocephin and Flagyl per admit team, GI panel negative GI consulted, little to no n.p.o. intake ESRD -HD TTS HD, on schedule, had discussion with son a few times. Multiple family meetings with other siblings. No change and the dialysis is really not improving QOL and is an aggressive measure almost certainly making things more difficult for the pt and family (ie transport, being on hd, not feeling well after hd).   Pt rx HD on 8/31 BP was only 88/36 requiring 160ml bolus We were able to remove 2L of fluid out of goal of 3-4L; limited bec of hypotension and I am concerned that with required UF she is getting hypoperfusion to other vital organs. She's not able or refuses to swallow precluding Midodrine.  Machine is being flushed and patient should be on HD soon; goal UF 2-2.5L, hopefully is more stable on RRT today. Family bedside. She's still not answering questions appropriately and not following simple commands even with the daughter bedside. Very poor resp effort.  She is a borderline candidate at best for dialysis at this time and with the inability to ultrafiltrate and remove fluid she will not tolerate dialysis. Nothing has changed from the renal standpoint; the pt is not doing well with RRT with hypotension limiting fluid removal. HD certainly not improving her QOL by any means. Rec from renal standpoint is to move toward comfort measures and stop iHD.  Will await further palliative discussions for direction.  HTN/volume -BP stable on HD bilateral pleural effusions, on midodrine 1 L yesterday UF tolerated , does not appear volume overloaded with  minimal p.o. intake.  Last O2 sat 97 % on room air yesterday none today AMS-chronic probable dementia, now with element of possible med with gabapentin possibly contributing.  Little to no p.o. intake, palliative care seeing per notes= multiple  meetings already. Recommending comfort measures.  Dysphagia-work-up per admitting Elevated troponin-per cardiology no chest pain or ischemic symptoms follow-up trend no further work-up Anemia -Hgb 12.5 no ESA noted was getting IV iron through 8/26 Secondary hyperparathyroidism -corrected calcium high holding Hectorol for now, 5.5 phosphorus no binders  FTT /Goals of care= palliative seeing patient with her multiple problems dysphagia ongoing abdominal pain advanced dementia= notes if patient has not improved over weekend, having meeting Monday 8/28 at 2:30 PM ="additional conversations about comfort focused care to be done. "  Subjective: Seen in room w/ daughter, SIL and other family members  present. Patient just appears uncomfortable; refuses to answer questions even with the daughter as the translator.    Objective Vital signs in last 24 hours: Vitals:   03/04/22 0500 03/04/22 0815 03/04/22 0816 03/04/22 0824  BP: (!) 102/34  (!) 99/26 (!) 109/33  Pulse: 62  (!) 56 (!) 56  Resp: 14  15 18   Temp: 97.6 F (36.4 C)  98.1 F (36.7 C) 98.5 F (36.9 C)  TempSrc: Axillary   Oral  SpO2: 94%  95% 98%  Weight:  54.5 kg    Height:       Weight change:  Physical Exam: General: Elderly chronically ill female NAD Heart: RRR no MRG Lungs: CTA but decreased respiratory effort nonlabored breathing room air  Abdomen: NABS, soft, epigastric tenderness no rebound, no ascites, ND Extremities: No pedal edema Dialysis Access: Left upper arm AV Gore-Tex graft positive bruit     Labs: Basic Metabolic Panel: Recent Labs  Lab 02/27/22 0453 02/28/22 0158 03/02/22 0650  NA 136 135 132*  K 3.2* 3.3* 4.1  CL 95* 94* 91*  CO2 30 27 26   GLUCOSE 188*  226* 181*  BUN 15 20 14   CREATININE 6.03* 7.10* 6.83*  CALCIUM 10.2 10.0 9.7  PHOS 5.7* 5.8* 5.0*   Liver Function Tests: Recent Labs  Lab 02/26/22 0925 02/27/22 0453 02/28/22 0158 03/02/22 0650  AST 46* 40 33  --   ALT 20 18 17   --   ALKPHOS 117 106 91  --   BILITOT 0.3 0.4 0.5  --   PROT 6.1* 6.5 6.3*  --   ALBUMIN 2.4* 2.4* 2.8* 2.7*   No results for input(s): "LIPASE", "AMYLASE" in the last 168 hours. No results for input(s): "AMMONIA" in the last 168 hours. CBC: Recent Labs  Lab 02/26/22 0925 02/27/22 0214 02/28/22 0158 03/02/22 0650  WBC 5.2 5.2 5.1 5.1  NEUTROABS  --  3.0 3.4  --   HGB 12.6 13.5 11.4* 12.6  HCT 38.8 42.4 34.6* 38.4  MCV 100.8* 101.2* 99.7 100.8*  PLT 155 118* 136* 128*   Cardiac Enzymes: No results for input(s): "CKTOTAL", "CKMB", "CKMBINDEX", "TROPONINI" in the last 168 hours. CBG: Recent Labs  Lab 03/02/22 2124 03/03/22 0746 03/03/22 1117 03/03/22 2105 03/04/22 0821  GLUCAP 176* 192* 157* 125* 136*    Studies/Results: No results found. Medications:  dextrose 30 mL/hr at 03/02/22 1234    (feeding supplement) PROSource Plus  30 mL Oral BID BM   feeding supplement  1 Container Oral TID BM   heparin  5,000 Units Subcutaneous Q8H   midodrine  5 mg Oral TID WC   oxyCODONE  10 mg Sublingual QID   thiamine (VITAMIN B1) injection  100 mg Intravenous Daily

## 2022-03-05 LAB — BASIC METABOLIC PANEL
Anion gap: 10 (ref 5–15)
BUN: 7 mg/dL — ABNORMAL LOW (ref 8–23)
CO2: 28 mmol/L (ref 22–32)
Calcium: 9.6 mg/dL (ref 8.9–10.3)
Chloride: 94 mmol/L — ABNORMAL LOW (ref 98–111)
Creatinine, Ser: 4.19 mg/dL — ABNORMAL HIGH (ref 0.44–1.00)
GFR, Estimated: 11 mL/min — ABNORMAL LOW (ref >60)
Glucose, Bld: 210 mg/dL — ABNORMAL HIGH (ref 70–99)
Potassium: 3.7 mmol/L (ref 3.5–5.1)
Sodium: 132 mmol/L — ABNORMAL LOW (ref 135–145)

## 2022-03-05 LAB — CBC WITH DIFFERENTIAL/PLATELET
Abs Immature Granulocytes: 0.02 10*3/uL (ref 0.00–0.07)
Basophils Absolute: 0.1 10*3/uL (ref 0.0–0.1)
Basophils Relative: 1 %
Eosinophils Absolute: 0.2 10*3/uL (ref 0.0–0.5)
Eosinophils Relative: 3 %
HCT: 36.9 % (ref 36.0–46.0)
Hemoglobin: 12.3 g/dL (ref 12.0–15.0)
Immature Granulocytes: 0 %
Lymphocytes Relative: 21 %
Lymphs Abs: 1.2 10*3/uL (ref 0.7–4.0)
MCH: 32.6 pg (ref 26.0–34.0)
MCHC: 33.3 g/dL (ref 30.0–36.0)
MCV: 97.9 fL (ref 80.0–100.0)
Monocytes Absolute: 0.6 10*3/uL (ref 0.1–1.0)
Monocytes Relative: 10 %
Neutro Abs: 3.6 10*3/uL (ref 1.7–7.7)
Neutrophils Relative %: 65 %
Platelets: 134 10*3/uL — ABNORMAL LOW (ref 150–400)
RBC: 3.77 MIL/uL — ABNORMAL LOW (ref 3.87–5.11)
RDW: 15.8 % — ABNORMAL HIGH (ref 11.5–15.5)
WBC: 5.6 10*3/uL (ref 4.0–10.5)
nRBC: 0 % (ref 0.0–0.2)

## 2022-03-05 LAB — GLUCOSE, CAPILLARY
Glucose-Capillary: 211 mg/dL — ABNORMAL HIGH (ref 70–99)
Glucose-Capillary: 219 mg/dL — ABNORMAL HIGH (ref 70–99)
Glucose-Capillary: 192 mg/dL — ABNORMAL HIGH (ref 70–99)
Glucose-Capillary: 212 mg/dL — ABNORMAL HIGH (ref 70–99)
Glucose-Capillary: 214 mg/dL — ABNORMAL HIGH (ref 70–99)

## 2022-03-05 MED ORDER — THIAMINE MONONITRATE 100 MG PO TABS
100.0000 mg | ORAL_TABLET | Freq: Every day | ORAL | Status: DC
  Administered 2022-03-06 – 2022-03-09 (×3): 100 mg via ORAL
  Filled 2022-03-05 (×3): qty 1

## 2022-03-05 NOTE — Progress Notes (Signed)
CMT called inform that patient sinus brady in mid 30's but came back up to sinus rhythm. Patient asleep no signs of distress.

## 2022-03-05 NOTE — Progress Notes (Signed)
PHARMACIST - PHYSICIAN COMMUNICATION  DR:   Verlon Au  CONCERNING: IV to Oral Route Change Policy  RECOMMENDATION: This patient is receiving thiamine by the intravenous route.  Based on criteria approved by the Pharmacy and Therapeutics Committee, the intravenous medication(s) is/are being converted to the equivalent oral dose form(s).   DESCRIPTION: These criteria include: The patient is eating (either orally or via tube) and/or has been taking other orally administered medications for a least 24 hours The patient has no evidence of active gastrointestinal bleeding or impaired GI absorption (gastrectomy, short bowel, patient on TNA or NPO).  If you have questions about this conversion, please contact the Pharmacy Department  []   [phone] )  [facility] []   [phone] )  [facility] [x]   [phone] )  [facility] []   [phone] )  [facility] []   [phone] )  [facility]

## 2022-03-05 NOTE — Progress Notes (Signed)
Dialysis Orders: [facility]  400/500  52kg  2/2 baht  P2  Heparin 2000 LUA AVG - hep B labs here: pend - last HD 8/19, post wt 53.3kg, last wk edw lowered 3kg - last Hb 11.8 on 8/15, tsat 26%, no esa - iron sucrose IV 100 q hd thru 8/26 - doxercalciferol 5 ug IV tiw   Problem/Plan: Enterocolitis with diarrhea and poor p.o. intake.  Empiric IV Rocephin and Flagyl per admit team, GI panel negative GI consulted, little to no n.p.o. intake ESRD -HD TTS HD, on schedule, had discussion with son a few times. Multiple family meetings with other siblings. No change and the dialysis is really not improving QOL and is an aggressive measure almost certainly making things more difficult for the pt and family (ie transport, being on hd, not feeling well after hd).   She "tolerated" HD on 9/2 with 1.3L net UF but as usual SBP dropped into the low 80's.  With the necessary limited UF she is likely getting hypoperfusion to other vital organs. She's not able or refuses to swallow precluding Midodrine.  Daughter is bedside.  She remains a borderline candidate at best for dialysis at this time. Still nothing has changed from the renal standpoint;  HD certainly not improving her QOL by any means. Rec from renal standpoint is to move toward comfort measures and stop iHD given hypotension on HD and not improving QOL. Appreciate palliative discussions with the family.  Next HD on Tuesday; no absolute indication for RRT and the patient appears to be  comfortable w/ the daughter bedside.   HTN/volume -BP stable on HD bilateral pleural effusions, not tolerating midodrine as she throws up her medications. Issues with swallowing food and medications. AMS-chronic probable dementia, now with element of possible med with gabapentin possibly contributing.  Little to no p.o. intake, palliative care seeing per notes= multiple  meetings already. Recommending comfort measures.  Dysphagia-work-up per admitting Elevated  troponin-per cardiology no chest pain or ischemic symptoms follow-up trend no further work-up Anemia -Hgb 12.5 no ESA noted was getting IV iron through 8/26 Secondary hyperparathyroidism -corrected calcium high holding Hectorol for now, 5.5 phosphorus no binders  FTT /Goals of care= palliative seeing patient with her multiple problems dysphagia ongoing abdominal pain advanced dementia= notes if patient has not improved over weekend, having meeting Monday 8/28 at 2:30 PM ="additional conversations about comfort focused care to be done. "  Subjective: Seen in room w/ daughter present. Patient just appears comfortable; nods to questions translated through daughter.   Objective Vital signs in last 24 hours: Vitals:   03/04/22 1545 03/04/22 1704 03/04/22 2124 03/05/22 0500  BP: (!) 116/38 (!) 107/40 (!) 106/38 (!) 97/33  Pulse: 67  61 60  Resp: 18 16    Temp: 97.7 F (36.5 C)  97.6 F (36.4 C) 97.7 F (36.5 C)  TempSrc:   Oral Oral  SpO2: 99%  99% 100%  Weight:      Height:       Weight change:     Physical Exam: General: Elderly chronically ill female NAD Heart: RRR no MRG Lungs: CTA but decreased respiratory effort nonlabored breathing room air  Abdomen: soft, mild epigastric tenderness no rebound, no ascites, ND Extremities: No pedal edema Dialysis Access: Left upper arm AV Gore-Tex graft positive bruit     Labs: Basic Metabolic Panel: Recent Labs  Lab 02/27/22 0453 02/28/22 0158 03/02/22 0650 03/05/22 0054  NA 136 135 132* 132*  K 3.2*  3.3* 4.1 3.7  CL 95* 94* 91* 94*  CO2 30 27 26 28   GLUCOSE 188* 226* 181* 210*  BUN 15 20 14  7*  CREATININE 6.03* 7.10* 6.83* 4.19*  CALCIUM 10.2 10.0 9.7 9.6  PHOS 5.7* 5.8* 5.0*  --    Liver Function Tests: Recent Labs  Lab 02/26/22 0925 02/27/22 0453 02/28/22 0158 03/02/22 0650  AST 46* 40 33  --   ALT 20 18 17   --   ALKPHOS 117 106 91  --   BILITOT 0.3 0.4 0.5  --   PROT 6.1* 6.5 6.3*  --   ALBUMIN 2.4* 2.4* 2.8*  2.7*   No results for input(s): "LIPASE", "AMYLASE" in the last 168 hours. No results for input(s): "AMMONIA" in the last 168 hours. CBC: Recent Labs  Lab 02/26/22 0925 02/27/22 0214 02/28/22 0158 03/02/22 0650 03/05/22 0054  WBC 5.2 5.2 5.1 5.1 5.6  NEUTROABS  --  3.0 3.4  --  3.6  HGB 12.6 13.5 11.4* 12.6 12.3  HCT 38.8 42.4 34.6* 38.4 36.9  MCV 100.8* 101.2* 99.7 100.8* 97.9  PLT 155 118* 136* 128* 134*   Cardiac Enzymes: No results for input(s): "CKTOTAL", "CKMB", "CKMBINDEX", "TROPONINI" in the last 168 hours. CBG: Recent Labs  Lab 03/03/22 2105 03/04/22 0821 03/04/22 1114 03/04/22 1543 03/04/22 2123  GLUCAP 125* 136* 145* 154* 114*    Studies/Results: No results found. Medications:  dextrose 30 mL/hr at 03/05/22 0612    (feeding supplement) PROSource Plus  30 mL Oral BID BM   feeding supplement  1 Container Oral TID BM   heparin  5,000 Units Subcutaneous Q8H   midodrine  5 mg Oral TID WC   thiamine (VITAMIN B1) injection  100 mg Intravenous Daily

## 2022-03-05 NOTE — Progress Notes (Signed)
OT Cancellation Note  Patient Details Name: Sarah Kelley MRN: [id number] DOB: 1951/06/18   Cancelled Treatment:    Reason Eval/Treat Not Completed: OT screened, no needs identified, will sign off New OT orders received. OT evaluated and discharged on 8/21, screened on 8/23 when additional orders received. Pt receives extensive assist for ADLs/transfers at baseline and noted with continued medical decline during this admission. Palliative involved with [place] discussions. No skilled OT services needed at acute level at this time.   Layla Maw 03/05/2022, 6:56 AM

## 2022-03-05 NOTE — Progress Notes (Signed)
PT Cancellation Note  Patient Details Name: Sarah Kelley MRN: [id number] DOB: Dec 17, 1950   Cancelled Treatment:    Reason Eval/Treat Not Completed:  (Pt evaluated and discharged by physical therapy on 08/21. Pt needs hoyer lift for safe transfers. Pt being seen by palliative care to determine [place]. Pt is not currently appropriate for acute care physical therapy.)  Donna Bernard, PT   Donna Bernard 03/05/2022, 8:17 AM

## 2022-03-05 NOTE — Progress Notes (Signed)
PROGRESS NOTE    Sarah Kelley  WCB:[id number] DOB: 10/03/1950 DOA: 02/18/2022 PCP: [name], MD  Chief Complaint  Patient presents with   Failure To Thrive    Brief Narrative:  71 y.o. female with medical history significant of ESRD on TTS dialysis, HTN, diabetes, HLD who presented with 2 weeks decreased PO intake, diarrhea, abdominal discomfort, globus sensation.  Imaging at presentation concerning for colitis/enteritis.  She's been treated with IV abx and has received dialysis while she's been here.  GI and SLP eval.   Palliative care was engaged as she was not improving throughout hospital course She became more responsive on 9/1 and was started on a diet on 9/2  Plan is for d/c home 1-2 d if remains same with therapy lift and wc cushion as she is baseline per eval   Assessment & Plan:   Principal Problem:   Enterocolitis Active Problems:   Dysphagia   Goals of care, counseling/discussion   ESRD (end stage renal disease) (HCC)   Elevated troponin   Essential hypertension   Type 2 diabetes mellitus with chronic kidney disease on chronic dialysis, with long-term current use of insulin (HCC)   Hypothyroidism   Bilateral pleural effusion   Pain   Dementia without behavioral disturbance (HCC)   Hypokalemia   Malnutrition of moderate degree   Assessment and Plan: * Enterocolitis Presumed infectious initially on admission, with frequent diarrhea --C. 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 GI consulted, recommend adjusting diet to foods/beverages she tolerates Mentation improved some although still high risk for aspiration-placed on dysphagia 1 on 9/2--tolerating some diet, not eating much simplify as much as possible oral meds and she will need to take them with ice cream/pudding  Elevated  troponin Cards indicated no work-up and flat troponins not consistent with ACS-echo grade 1 DD with EF 55% with no wall motion abnormality  ESRD (end stage renal disease) (HCC) Appears to be possibly volume overloaded with mod B pleural effusions on CT scan despite recent GI illness with diarrhea, poor PO intake It has been difficult to manage during dialysis and she becomes hypotensive and needs IV albumin and fluids--she is also on midodrine 5 mg 3 times daily Nephrology discussed with the patient that dialysis may not be a good long-term solution- Family was able to see how she did on dialysis and felt she did fair although she needed a bolus of fluid on 9/2 Defer OP decision re: HE to OP neprhology  Hypothyroidism TSH wnl with elevated free T4, unclear significance --  Type 2 diabetes mellitus with chronic kidney disease on chronic dialysis, with long-term current use of insulin ([place]) -Hemoglobin A1c 7.7  (02/18/2022 ). Sugars ranging 200 range D/c d/10 and observe cbg q2.  If manages to keep CBG up, can d/c  Essential hypertension -fluctuating bP, intermittent hypotension--periodically getting albumin at HD -Continue to hold BP medications. -continue midodrine 5 3 times daily  Pain Unclear cause, see below Delirium? Fentanyl prn ordered, discontinued all opiates to ensure mentation clears  Bilateral pleural effusion Repeat CXR 8/24 with mild bilateral effusion, increased mild central pulm vascular congestion CT with moderate R and small L effusions on 8/27 -> suspected initially due to volume, though renal doesn't think she's overloaded  Hypokalemia - Likely in part secondary to GI losses, resolved  Goals of care, counseling/discussion Will c/s palliative care to  help start [place] conversations Appreciate assistance, she's been transitioned to DNR/DNI.  Several meetings have been accomplished with Pallaiitve and she remains DNR but not comfort  Dementia without behavioral  disturbance ([place]) Delirium precautions--has had some improvement     DVT prophylaxis: heparin Code Status: full Family Communication:  Multiple family members present at bedside when I evaluated--discussed with power of attorney [place].  Discussed that she appears to have improved. Disposition:   Status is: Inpatient Remains inpatient appropriate because: need for continued management   Consultants:  Renal GI  Procedures:  none  Antimicrobials:  Anti-infectives (From admission, onward)    Start     Dose/Rate Route Frequency Ordered Stop   02/27/22 2200  cefTRIAXone (ROCEPHIN) 2 g in sodium chloride 0.9 % 100 mL IVPB        2 g 200 mL/hr over 30 Minutes Intravenous Every 24 hours 02/27/22 1814 02/28/22 2250   02/18/22 2230  cefTRIAXone (ROCEPHIN) 2 g in sodium chloride 0.9 % 100 mL IVPB  Status:  Discontinued        2 g 200 mL/hr over 30 Minutes Intravenous Every 24 hours 02/18/22 2217 02/27/22 1814   02/18/22 2230  metroNIDAZOLE (FLAGYL) IVPB 500 mg  Status:  Discontinued        500 mg 100 mL/hr over 60 Minutes Intravenous Every 12 hours 02/18/22 2217 02/28/22 1322       Subjective:  More awake today ate about 20% of meals  Objective: Vitals:   03/04/22 1545 03/04/22 1704 03/04/22 2124 03/05/22 0500  BP: (!) 116/38 (!) 107/40 (!) 106/38 (!) 97/33  Pulse: 67  61 60  Resp: 18 16    Temp: 97.7 F (36.5 C)  97.6 F (36.4 C) 97.7 F (36.5 C)  TempSrc:   Oral Oral  SpO2: 99%  99% 100%  Weight:      Height:       No intake or output data in the 24 hours ending 03/05/22 1729  Filed Weights   03/02/22 1139 03/04/22 0815 03/04/22 1300  Weight: 51.2 kg 54.5 kg 53.1 kg    Examination:   no icterus no pallor arcus senilis, cachectic Abdomen scaphoid Chest clear S1-S2 no murmur No lower extremity edema Did not examine skin  Data Reviewed: I have personally reviewed following labs and imaging studies  CBC: Recent Labs  Lab 02/27/22 0214 02/28/22 0158  03/02/22 0650 03/05/22 0054  WBC 5.2 5.1 5.1 5.6  NEUTROABS 3.0 3.4  --  3.6  HGB 13.5 11.4* 12.6 12.3  HCT 42.4 34.6* 38.4 36.9  MCV 101.2* 99.7 100.8* 97.9  PLT 118* 136* 128* 134*     Basic Metabolic Panel: Recent Labs  Lab 02/27/22 0453 02/28/22 0158 03/02/22 0650 03/05/22 0054  NA 136 135 132* 132*  K 3.2* 3.3* 4.1 3.7  CL 95* 94* 91* 94*  CO2 30 27 26 28   GLUCOSE 188* 226* 181* 210*  BUN 15 20 14  7*  CREATININE 6.03* 7.10* 6.83* 4.19*  CALCIUM 10.2 10.0 9.7 9.6  MG 1.8 1.9  --   --   PHOS 5.7* 5.8* 5.0*  --      GFR: Estimated Creatinine Clearance: 8.6 mL/min (A) (by C-G formula based on SCr of 4.19 mg/dL (H)).  Liver Function Tests: Recent Labs  Lab 02/27/22 0453 02/28/22 0158 03/02/22 0650  AST 40 33  --   ALT 18 17  --   ALKPHOS 106 91  --   BILITOT 0.4 0.5  --  PROT 6.5 6.3*  --   ALBUMIN 2.4* 2.8* 2.7*   Recent Labs  Lab 03/04/22 1114 03/04/22 1543 03/04/22 2123 03/05/22 0859 03/05/22 1214  GLUCAP 145* 154* 114* 211* 219*    Radiology Studies: No results found.  Scheduled Meds:  (feeding supplement) PROSource Plus  30 mL Oral BID BM   feeding supplement  1 Container Oral TID BM   heparin  5,000 Units Subcutaneous Q8H   midodrine  5 mg Oral TID WC   [START ON 03/06/2022] thiamine  100 mg Oral Daily   Continuous Infusions:  dextrose 30 mL/hr at 03/05/22 0612     LOS: 14 days    Time spent: 13  Nita Sells, MD [facility]   To contact the attending provider between 7A-7P or the covering provider during after hours 7P-7A, please log into the web site [email or web address] and access using universal [place] password for that web site. If you do not have the password, please call the hospital operator.  03/05/2022, 5:29 PM

## 2022-03-05 NOTE — IPAL (Signed)
  Interdisciplinary Goals of Care Family Meeting   Date carried out: 03/05/2022  Location of the meeting: {IDGC Location:21390}  Member's involved: {IDGC Participants:21367::"Physician","Bedside Registered Nurse","Social Worker","Family Member or next of kin"}  Durable Power of Tour manager: ***    Discussion: We discussed goals of care for [employer] .  ***  Code status: {IDGC Code Status:21369::"Full Code"}  Disposition: {IDGC Disposition:21372}  Time spent for the meeting: ***    Nita Sells, MD  03/05/2022, 6:03 PM

## 2022-03-06 DIAGNOSIS — R1310 Dysphagia, unspecified: Secondary | ICD-10-CM

## 2022-03-06 LAB — RENAL FUNCTION PANEL
Albumin: 3 g/dL — ABNORMAL LOW (ref 3.5–5.0)
Anion gap: 12 (ref 5–15)
BUN: 14 mg/dL (ref 8–23)
CO2: 27 mmol/L (ref 22–32)
Calcium: 10.2 mg/dL (ref 8.9–10.3)
Chloride: 92 mmol/L — ABNORMAL LOW (ref 98–111)
Creatinine, Ser: 5.73 mg/dL — ABNORMAL HIGH (ref 0.44–1.00)
GFR, Estimated: 7 mL/min — ABNORMAL LOW (ref >60)
Glucose, Bld: 177 mg/dL — ABNORMAL HIGH (ref 70–99)
Phosphorus: 5.4 mg/dL — ABNORMAL HIGH (ref 2.5–4.6)
Potassium: 3.9 mmol/L (ref 3.5–5.1)
Sodium: 131 mmol/L — ABNORMAL LOW (ref 135–145)

## 2022-03-06 LAB — CBC WITH DIFFERENTIAL/PLATELET
Abs Immature Granulocytes: 0.02 10*3/uL (ref 0.00–0.07)
Basophils Absolute: 0.1 10*3/uL (ref 0.0–0.1)
Basophils Relative: 2 %
Eosinophils Absolute: 0.2 10*3/uL (ref 0.0–0.5)
Eosinophils Relative: 4 %
HCT: 40 % (ref 36.0–46.0)
Hemoglobin: 13 g/dL (ref 12.0–15.0)
Immature Granulocytes: 0 %
Lymphocytes Relative: 23 %
Lymphs Abs: 1.4 10*3/uL (ref 0.7–4.0)
MCH: 32.5 pg (ref 26.0–34.0)
MCHC: 32.5 g/dL (ref 30.0–36.0)
MCV: 100 fL (ref 80.0–100.0)
Monocytes Absolute: 0.8 10*3/uL (ref 0.1–1.0)
Monocytes Relative: 13 %
Neutro Abs: 3.5 10*3/uL (ref 1.7–7.7)
Neutrophils Relative %: 58 %
Platelets: 139 10*3/uL — ABNORMAL LOW (ref 150–400)
RBC: 4 MIL/uL (ref 3.87–5.11)
RDW: 15.8 % — ABNORMAL HIGH (ref 11.5–15.5)
WBC: 6.1 10*3/uL (ref 4.0–10.5)
nRBC: 0 % (ref 0.0–0.2)

## 2022-03-06 LAB — GLUCOSE, CAPILLARY
Glucose-Capillary: 148 mg/dL — ABNORMAL HIGH (ref 70–99)
Glucose-Capillary: 149 mg/dL — ABNORMAL HIGH (ref 70–99)
Glucose-Capillary: 163 mg/dL — ABNORMAL HIGH (ref 70–99)
Glucose-Capillary: 167 mg/dL — ABNORMAL HIGH (ref 70–99)
Glucose-Capillary: 171 mg/dL — ABNORMAL HIGH (ref 70–99)
Glucose-Capillary: 176 mg/dL — ABNORMAL HIGH (ref 70–99)
Glucose-Capillary: 186 mg/dL — ABNORMAL HIGH (ref 70–99)

## 2022-03-06 MED ORDER — MIDODRINE HCL 5 MG PO TABS
10.0000 mg | ORAL_TABLET | Freq: Three times a day (TID) | ORAL | Status: DC
  Administered 2022-03-06 – 2022-03-09 (×8): 10 mg via ORAL
  Filled 2022-03-06 (×8): qty 2

## 2022-03-06 MED ORDER — CHLORHEXIDINE GLUCONATE CLOTH 2 % EX PADS
6.0000 | MEDICATED_PAD | Freq: Every day | CUTANEOUS | Status: DC
  Administered 2022-03-07 – 2022-03-09 (×2): 6 via TOPICAL

## 2022-03-06 NOTE — Progress Notes (Signed)
Patient ID: Sarah Kelley, female   DOB: [birth year], 71 y.o.   MRN: [id number]    Progress Note from the Palliative Medicine Team at [facility]   Patient Name: Sarah Kelley        Date: 03/06/2022 DOB: 14-Jul-1950  Age: 71 y.o. MRN#: [id number] Attending Physician: Sarah Sells, MD Primary Care Physician: Sarah Rakes, MD Admit Date: 02/18/2022   Medical records reviewed   71 y.o. 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 Pt presents to ED with 2 week h/o reduced PO intake, diaphoresis over past 3-4 days and profuse diarrhea - GI consulted stool studies (-) though (+) colitis on BS antibiotics. (+) Dysphagia. Palliative care has been asked to get involved to further address goals of care in the setting of overalll decline and failure to thrive.     Today is day 15 of this hospital stay and patient continues with overall failure to thrive.    This NP assessed patient at the bedside as a follow up for palliative medicine needs and emotional support.  Patient's son/ Sarah Kelley at bedside.  I utilized computer interpreter for entire visit.  Patient appears frail and cachectic.  She is unable to follow commands even with the assistance of the interpreter.  Patient had difficulty swallowing her morning pills even with applesauce  Education offered today on patient's current medical situation and associated long-term poor prognosis. Education offered on the natural trajectory of dementia as it relates to continued physical,Functional and cognitive decline. Education offered on concept specific to adult failure to thrive and the limitations of medical interventions to prolong life when the body does fail to thrive.  Education offered on various in-home services specific to home health versus hospice.  Education offered on hospice  benefit;Philosophy and eligibility.  Sarah Kelley tells me that he understands what I am saying but that "I cannot even say the words", when further questioned the words he was referring to were that his mother was dying.  He tells me that "maybe I am in denial"  Emotional support offered.  Education offered on the legalities of medical decision maker for the patient without capacity. Patient does not have medical capacity at this time and does not have documented H POA or living well.  Patient's spouse died 5 years ago  Medical decision making being now falls to the majority of patient's reasonably available adult children.      Education offered today regarding  the importance of continued conversation with family and their  medical providers regarding overall plan of care and treatment options,  ensuring decisions are within the context of the patients values and GOCs.  Plan is to meet tomorrow morning at 9:00 with Sarah Kelley/Spanish interpreter  Questions and concerns addressed   Discussed with Dr Sarah Kelley   Sarah Lessen NP  Palliative Medicine Team Team Phone # [phone] Pager [phone]

## 2022-03-06 NOTE — Progress Notes (Addendum)
[facility] Progress Note   Subjective: Son at bedside. Says he doesn't understand "what the problem is that she can't do dialysis here like at OP center". Attempted to explain but language barrier is an issue. Patient smiles at me, does not appear to be in acute distress. HD tomorrow on schedule.   Objective Vitals:   03/06/22 0000 03/06/22 0250 03/06/22 0838 03/06/22 1004  BP: (!) 105/32 (!) 90/40 (!) 74/32 102/87  Pulse: (!) 54 64 68 (!) 102  Resp:  20 14 13   Temp: 98.6 F (37 C) 97.6 F (36.4 C) 98.3 F (36.8 C)   TempSrc: Axillary Oral Oral   SpO2: 100% 99% 99%   Weight:      Height:       Physical Exam General: Chronically ill appearing elderly female in NAD Heart: SB on monitor with PVCs. S1,S2 No M/R/G.  Lungs: CTAB A/P Abdomen: NABS, NT, ND Extremities:No LE edema Dialysis Access: L AVG +T/B   Additional Objective Labs: Basic Metabolic Panel: Recent Labs  Lab 02/28/22 0158 03/02/22 0650 03/05/22 0054 03/06/22 0059  NA 135 132* 132* 131*  K 3.3* 4.1 3.7 3.9  CL 94* 91* 94* 92*  CO2 27 26 28 27   GLUCOSE 226* 181* 210* 177*  BUN 20 14 7* 14  CREATININE 7.10* 6.83* 4.19* 5.73*  CALCIUM 10.0 9.7 9.6 10.2  PHOS 5.8* 5.0*  --  5.4*   Liver Function Tests: Recent Labs  Lab 02/28/22 0158 03/02/22 0650 03/06/22 0059  AST 33  --   --   ALT 17  --   --   ALKPHOS 91  --   --   BILITOT 0.5  --   --   PROT 6.3*  --   --   ALBUMIN 2.8* 2.7* 3.0*   No results for input(s): "LIPASE", "AMYLASE" in the last 168 hours. CBC: Recent Labs  Lab 02/28/22 0158 03/02/22 0650 03/05/22 0054 03/06/22 0059  WBC 5.1 5.1 5.6 6.1  NEUTROABS 3.4  --  3.6 3.5  HGB 11.4* 12.6 12.3 13.0  HCT 34.6* 38.4 36.9 40.0  MCV 99.7 100.8* 97.9 100.0  PLT 136* 128* 134* 139*   Blood Culture    Component Value Date/Time   SDES BLOOD RIGHT HAND 02/13/2018 0404   SPECREQUEST  02/13/2018 0404    BOTTLES DRAWN AEROBIC AND ANAEROBIC Blood Culture adequate volume    CULT  02/13/2018 0404    NO GROWTH 5 DAYS Performed at [facility], [place] [address], [place], [place] [address]    REPTSTATUS 02/18/2018 FINAL 02/13/2018 0404    Cardiac Enzymes: No results for input(s): "CKTOTAL", "CKMB", "CKMBINDEX", "TROPONINI" in the last 168 hours. CBG: Recent Labs  Lab 03/06/22 0032 03/06/22 0232 03/06/22 0411 03/06/22 0616 03/06/22 0748  GLUCAP 186* 171* 167* 148* 149*   Iron Studies: No results for input(s): "IRON", "TIBC", "TRANSFERRIN", "FERRITIN" in the last 72 hours. @lablastinr3 @ Studies/Results: No results found. Medications:   (feeding supplement) PROSource Plus  30 mL Oral BID BM   feeding supplement  1 Container Oral TID BM   heparin  5,000 Units Subcutaneous Q8H   midodrine  5 mg Oral TID WC   thiamine  100 mg Oral Daily     Dialysis Orders: [facility]h  400/500  52kg  2/2  P2  Heparin 2000 units IV LUA AVG - iron sucrose IV 100 q hd thru 8/26 - doxercalciferol 5 ug IV tiw     Problem/Plan: Enterocolitis with diarrhea  and poor p.o. intake.  Empiric IV Rocephin and Flagyl per admit team completed GI panel negative GI consulted, now signed off.  ESRD -HD TTS HD, on schedule, had discussion with son a few times. Multiple family meetings with other siblings. No change and the dialysis is really not improving QOL and is an aggressive measure almost certainly making things more difficult for the pt and family (ie transport, being on hd, not feeling well after hd).    She "tolerated" HD on 9/2 with 1.3L net UF but as usual SBP dropped into the low 80's.   With the necessary limited UF she is likely getting hypoperfusion to other vital organs.     She remains a borderline candidate at best for dialysis at this time. Still nothing has changed from the renal standpoint;  HD certainly not improving her QOL by any means. Rec from renal standpoint is to move toward comfort measures and stop iHD given hypotension on HD and not improving QOL.  Appreciate palliative discussions with the family.   Next HD on 03/07/22.      HTN/volume -BP stable on HD bilateral pleural effusions, not tolerating midodrine as she throws up her medications. Issues with swallowing food and medications. Na down to 131, suggesting excess volume but not tolerating UF D/T hypotension. Increase midodrine dose today. UF as tolerated.  AMS-chronic probable dementia, now with element of possible med with gabapentin possibly contributing.  Little to no p.o. intake, palliative care seeing per notes= multiple  meetings already. Recommending comfort measures.  Dysphagia-work-up per admitting Elevated troponin-per cardiology no chest pain or ischemic symptoms follow-up trend no further work-up Anemia -Hgb 13. No ESA needed. Recent Fe load. Follow HGB.  Secondary hyperparathyroidism -corrected calcium high holding Hectorol for now,  FTT /Goals of care- palliative seeing patient with her multiple problems dysphagia ongoing abdominal pain advanced dementia. Patient is DNI but not comfort care despite family being aware that HD is not improving GOC. Appreciate assistance from palliative care.   Ronna Herskowitz H. Beauty Pless NP-C 03/06/2022, 11:29 AM  [employer] [phone]

## 2022-03-06 NOTE — Progress Notes (Signed)
PROGRESS NOTE    Sarah Kelley  NTI:[id number] DOB: [birth year] DOA: 02/18/2022 PCP: [name], MD  Chief Complaint  Patient presents with   Failure To Thrive    Brief Narrative:  71 y.o. female with medical history significant of ESRD on TTS dialysis, HTN, diabetes, HLD who presented with 2 weeks decreased PO intake, diarrhea, abdominal discomfort, globus sensation.  Imaging at presentation concerning for colitis/enteritis.  She's been treated with IV abx and has received dialysis while she's been here.  GI and SLP eval.   Palliative care was engaged as she was not improving throughout hospital course She became more responsive on 9/1 and was started on a diet on 9/2 Son is primary decision maker and Family is having difficulty accepting EOL status and multiple other family members will eigh in again on meeting on 9/5     Assessment & Plan:   Principal Problem:   Enterocolitis Active Problems:   Dysphagia   Goals of care, counseling/discussion   ESRD (end stage renal disease) ([place])   Elevated troponin   Essential hypertension   Type 2 diabetes mellitus with chronic kidney disease on chronic dialysis, with long-term current use of insulin (HCC)   Hypothyroidism   Bilateral pleural effusion   Pain   Dementia without behavioral disturbance (HCC)   Hypokalemia   Malnutrition of moderate degree   Assessment and Plan: * Enterocolitis Presumed infectious initially on admission, with frequent diarrhea --C. 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 GI consulted, give foods/beverages she likes/tolerates Mentation improved some although still high risk for aspiration-placed on dysphagia 1 on 9/2--tolerating some diet, not eating much simplify as much as possible oral meds and she will need to take them  with ice cream/pudding  Elevated troponin Cards indicated no work-up and flat troponins not consistent with ACS-echo grade 1 DD with EF 55% with no wall motion abnormality  ESRD (end stage renal disease) ([place]) On admit overloaded with mod B pleural effusions on CT scan despite recent GI illness with diarrhea, poor PO intake It has been difficult to manage during dialysis - hypotensive and needs IV albumin and fluids--she is also on midodrine 5 mg 3 times daily Nephrology discussed with the patient that dialysis may not be a good long-term solution Family was able to see how she did on dialysis   Hypothyroidism TSH wnl with elevated free T4, unclear significance --  Type 2 diabetes mellitus with chronic kidney disease on chronic dialysis, with long-term current use of insulin ([place]) -Hemoglobin A1c 7.7  (02/18/2022 ). Sugars ranging 140-180 range D/c d/10 and observe cbg q2.   Able to d/c if  safe plan  Essential hypertension fluctuating bP, intermittent hypotension--periodically getting albumin at HD Continue to hold BP medications. continue midodrine 5 3 times daily  Pain Unclear cause, see below Delirium? Fentanyl prn ordered, discontinued all opiates to ensure mentation clears  Bilateral pleural effusion Repeat CXR 8/24 with mild bilateral effusion, increased mild central pulm vascular congestion CT with moderate R and small L effusions on 8/27 -> suspected initially due to volume, though renal doesn't think she's overloaded  Hypokalemia - Likely in part secondary to GI losses, resolved  Goals of care, counseling/discussion Will c/s palliative care to help start [place] conversations transitioned to DNR/DNI.  Several meetings have been accomplished with Pallaiitve and she remains DNR but not comfort  Dementia  without behavioral disturbance ([place]) Delirium precautions--has had some improvement     DVT prophylaxis: heparin Code Status: full Family Communication:  See IPAL  note 03/05/22 and palliative notes from earlier today Disposition:   Status is: Inpatient Remains inpatient appropriate because: need for continued management   Consultants:  Renal GI  Procedures:  none  Antimicrobials:  Anti-infectives (From admission, onward)    Start     Dose/Rate Route Frequency Ordered Stop   02/27/22 2200  cefTRIAXone (ROCEPHIN) 2 g in sodium chloride 0.9 % 100 mL IVPB        2 g 200 mL/hr over 30 Minutes Intravenous Every 24 hours 02/27/22 1814 02/28/22 2250   02/18/22 2230  cefTRIAXone (ROCEPHIN) 2 g in sodium chloride 0.9 % 100 mL IVPB  Status:  Discontinued        2 g 200 mL/hr over 30 Minutes Intravenous Every 24 hours 02/18/22 2217 02/27/22 1814   02/18/22 2230  metroNIDAZOLE (FLAGYL) IVPB 500 mg  Status:  Discontinued        500 mg 100 mL/hr over 60 Minutes Intravenous Every 12 hours 02/18/22 2217 02/28/22 1322       Subjective:  Awake incoherent  Objective: Vitals:   03/06/22 0000 03/06/22 0250 03/06/22 0838 03/06/22 1004  BP: (!) 105/32 (!) 90/40 (!) 74/32 102/87  Pulse: (!) 54 64 68 (!) 102  Resp:  20 14 13   Temp: 98.6 F (37 C) 97.6 F (36.4 C) 98.3 F (36.8 C)   TempSrc: Axillary Oral Oral   SpO2: 100% 99% 99%   Weight:      Height:        Intake/Output Summary (Last 24 hours) at 03/06/2022 1420 Last data filed at 03/06/2022 0841 Gross per 24 hour  Intake 0 ml  Output --  Net 0 ml    Filed Weights   03/02/22 1139 03/04/22 0815 03/04/22 1300  Weight: 51.2 kg 54.5 kg 53.1 kg   Examination:  No icterus no pallor arcus senilis, cachectic--somewhat interactive Abdomen scaphoid Chest clear S1-S2 no murmur No lower extremity edema legs   Data Reviewed: I have personally reviewed following labs and imaging studies  CBC: Recent Labs  Lab 02/28/22 0158 03/02/22 0650 03/05/22 0054 03/06/22 0059  WBC 5.1 5.1 5.6 6.1  NEUTROABS 3.4  --  3.6 3.5  HGB 11.4* 12.6 12.3 13.0  HCT 34.6* 38.4 36.9 40.0  MCV 99.7 100.8* 97.9  100.0  PLT 136* 128* 134* 139*     Basic Metabolic Panel: Recent Labs  Lab 02/28/22 0158 03/02/22 0650 03/05/22 0054 03/06/22 0059  NA 135 132* 132* 131*  K 3.3* 4.1 3.7 3.9  CL 94* 91* 94* 92*  CO2 27 26 28 27   GLUCOSE 226* 181* 210* 177*  BUN 20 14 7* 14  CREATININE 7.10* 6.83* 4.19* 5.73*  CALCIUM 10.0 9.7 9.6 10.2  MG 1.9  --   --   --   PHOS 5.8* 5.0*  --  5.4*     GFR: Estimated Creatinine Clearance: 6.3 mL/min (A) (by C-G formula based on SCr of 5.73 mg/dL (H)).  Liver Function Tests: Recent Labs  Lab 02/28/22 0158 03/02/22 0650 03/06/22 0059  AST 33  --   --   ALT 17  --   --   ALKPHOS 91  --   --   BILITOT 0.5  --   --   PROT 6.3*  --   --   ALBUMIN 2.8* 2.7* 3.0*    Recent Labs  Lab 03/06/22 0232 03/06/22 0411 03/06/22 0616 03/06/22 0748 03/06/22 1152  GLUCAP 171* 167* 148* 149* 163*    Radiology Studies: No results found.  Scheduled Meds:  (feeding supplement) PROSource Plus  30 mL Oral BID BM   [START ON 03/07/2022] Chlorhexidine Gluconate Cloth  6 each Topical Q0600   feeding supplement  1 Container Oral TID BM   heparin  5,000 Units Subcutaneous Q8H   midodrine  10 mg Oral TID WC   thiamine  100 mg Oral Daily   Continuous Infusions:   LOS: 15 days   Time spent: 50  Nita Sells, MD [facility]   To contact the attending provider between 7A-7P or the covering provider during after hours 7P-7A, please log into the web site [email or web address] and access using universal [place] password for that web site. If you do not have the password, please call the hospital operator.  03/06/2022, 2:20 PM

## 2022-03-06 NOTE — Plan of Care (Signed)
  Problem: Education: Goal: Ability to describe self-care measures that may prevent or decrease complications (Diabetes Survival Skills Education) will improve Outcome: Progressing   Problem: Coping: Goal: Ability to adjust to condition or change in health will improve Outcome: Progressing   Problem: Fluid Volume: Goal: Ability to maintain a balanced intake and output will improve Outcome: Progressing   

## 2022-03-06 NOTE — Plan of Care (Signed)
  Problem: Education: Goal: Ability to describe self-care measures that may prevent or decrease complications (Diabetes Survival Skills Education) will improve Outcome: Progressing Goal: Individualized Educational Video(s) Outcome: Progressing   Problem: Coping: Goal: Ability to adjust to condition or change in health will improve Outcome: Progressing   Problem: Fluid Volume: Goal: Ability to maintain a balanced intake and output will improve Outcome: Progressing   Problem: Health Behavior/Discharge Planning: Goal: Ability to identify and utilize available resources and services will improve Outcome: Progressing Goal: Ability to manage health-related needs will improve Outcome: Progressing   Problem: Metabolic: Goal: Ability to maintain appropriate glucose levels will improve Outcome: Progressing   Problem: Nutritional: Goal: Maintenance of adequate nutrition will improve Outcome: Progressing Goal: Progress toward achieving an optimal weight will improve Outcome: Progressing   Problem: Skin Integrity: Goal: Risk for impaired skin integrity will decrease Outcome: Progressing   Problem: Tissue Perfusion: Goal: Adequacy of tissue perfusion will improve Outcome: Progressing   Problem: Education: Goal: Knowledge of General Education information will improve Description: Including pain rating scale, medication(s)/side effects and non-pharmacologic comfort measures Outcome: Progressing   Problem: Health Behavior/Discharge Planning: Goal: Ability to manage health-related needs will improve Outcome: Progressing   Problem: Clinical Measurements: Goal: Ability to maintain clinical measurements within normal limits will improve Outcome: Progressing Goal: Will remain free from infection Outcome: Progressing Goal: Diagnostic test results will improve Outcome: Progressing Goal: Respiratory complications will improve Outcome: Progressing Goal: Cardiovascular complication will  be avoided Outcome: Progressing   Problem: Activity: Goal: Risk for activity intolerance will decrease Outcome: Progressing   Problem: Nutrition: Goal: Adequate nutrition will be maintained Outcome: Progressing   Problem: Coping: Goal: Level of anxiety will decrease Outcome: Progressing   Problem: Elimination: Goal: Will not experience complications related to bowel motility Outcome: Progressing Goal: Will not experience complications related to urinary retention Outcome: Progressing   Problem: Pain Managment: Goal: General experience of comfort will improve Outcome: Progressing   Problem: Safety: Goal: Ability to remain free from injury will improve Outcome: Progressing   Problem: Skin Integrity: Goal: Risk for impaired skin integrity will decrease Outcome: Progressing   Problem: Education: Goal: Knowledge of General Education information will improve Description: Including pain rating scale, medication(s)/side effects and non-pharmacologic comfort measures Outcome: Progressing   Problem: Health Behavior/Discharge Planning: Goal: Ability to manage health-related needs will improve Outcome: Progressing   Problem: Clinical Measurements: Goal: Ability to maintain clinical measurements within normal limits will improve Outcome: Progressing Goal: Will remain free from infection Outcome: Progressing Goal: Diagnostic test results will improve Outcome: Progressing Goal: Respiratory complications will improve Outcome: Progressing Goal: Cardiovascular complication will be avoided Outcome: Progressing   Problem: Activity: Goal: Risk for activity intolerance will decrease Outcome: Progressing   Problem: Nutrition: Goal: Adequate nutrition will be maintained Outcome: Progressing   Problem: Coping: Goal: Level of anxiety will decrease Outcome: Progressing   Problem: Elimination: Goal: Will not experience complications related to bowel motility Outcome:  Progressing Goal: Will not experience complications related to urinary retention Outcome: Progressing   Problem: Pain Managment: Goal: General experience of comfort will improve Outcome: Progressing   Problem: Safety: Goal: Ability to remain free from injury will improve Outcome: Progressing   Problem: Skin Integrity: Goal: Risk for impaired skin integrity will decrease Outcome: Progressing   Problem: Education: Goal: Knowledge of disease and its progression will improve Outcome: Progressing Goal: Individualized Educational Video(s) Outcome: Progressing   Problem: Fluid Volume: Goal: Compliance with measures to maintain balanced fluid volume will improve Outcome: Progressing

## 2022-03-07 ENCOUNTER — Inpatient Hospital Stay (HOSPITAL_COMMUNITY): Payer: Medicaid Other

## 2022-03-07 DIAGNOSIS — E440 Moderate protein-calorie malnutrition: Secondary | ICD-10-CM

## 2022-03-07 LAB — CBC
HCT: 37 % (ref 36.0–46.0)
Hemoglobin: 12 g/dL (ref 12.0–15.0)
MCH: 32.6 pg (ref 26.0–34.0)
MCHC: 32.4 g/dL (ref 30.0–36.0)
MCV: 100.5 fL — ABNORMAL HIGH (ref 80.0–100.0)
Platelets: 164 10*3/uL (ref 150–400)
RBC: 3.68 MIL/uL — ABNORMAL LOW (ref 3.87–5.11)
RDW: 15.6 % — ABNORMAL HIGH (ref 11.5–15.5)
WBC: 6.3 10*3/uL (ref 4.0–10.5)
nRBC: 0 % (ref 0.0–0.2)

## 2022-03-07 LAB — RENAL FUNCTION PANEL
Albumin: 2.7 g/dL — ABNORMAL LOW (ref 3.5–5.0)
Anion gap: 14 (ref 5–15)
BUN: 22 mg/dL (ref 8–23)
CO2: 25 mmol/L (ref 22–32)
Calcium: 9.7 mg/dL (ref 8.9–10.3)
Chloride: 93 mmol/L — ABNORMAL LOW (ref 98–111)
Creatinine, Ser: 8.03 mg/dL — ABNORMAL HIGH (ref 0.44–1.00)
GFR, Estimated: 5 mL/min — ABNORMAL LOW (ref >60)
Glucose, Bld: 131 mg/dL — ABNORMAL HIGH (ref 70–99)
Phosphorus: 6.4 mg/dL — ABNORMAL HIGH (ref 2.5–4.6)
Potassium: 4.3 mmol/L (ref 3.5–5.1)
Sodium: 132 mmol/L — ABNORMAL LOW (ref 135–145)

## 2022-03-07 LAB — GLUCOSE, CAPILLARY: Glucose-Capillary: 187 mg/dL — ABNORMAL HIGH (ref 70–99)

## 2022-03-07 MED ORDER — LIDOCAINE-PRILOCAINE 2.5-2.5 % EX CREA: 1.0000 | TOPICAL_CREAM | CUTANEOUS | Status: DC | PRN

## 2022-03-07 MED ORDER — HEPARIN SODIUM (PORCINE) 1000 UNIT/ML DIALYSIS: 20.0000 [IU]/kg | INTRAMUSCULAR | Status: DC | PRN

## 2022-03-07 MED ORDER — HEPARIN SODIUM (PORCINE) 1000 UNIT/ML DIALYSIS: 1000.0000 [IU] | INTRAMUSCULAR | Status: DC | PRN

## 2022-03-07 MED ORDER — LIDOCAINE HCL (PF) 1 % IJ SOLN: 5.0000 mL | INTRAMUSCULAR | Status: DC | PRN

## 2022-03-07 MED ORDER — PENTAFLUOROPROP-TETRAFLUOROETH EX AERO: 1.0000 | INHALATION_SPRAY | CUTANEOUS | Status: DC | PRN

## 2022-03-07 NOTE — Discharge Summary (Signed)
Physician Discharge Summary  [place] GNO:[id number] DOB: August 18, 1950 DOA: 02/18/2022  PCP: [name], MD  Admit date: 02/18/2022 Discharge date: 03/09/2022  Time spent: 36 minutes  Recommendations for Outpatient Follow-up:  Recommend outpatient palliative care follow-up patient at home Will need all the equipment such as hospital bed [facility] lift new cushion for wheelchair which we will order but patient's son has declined his Minimize meds as much as possible would not aggressively control blood pressure or blood sugar Repeat thyroid studies in the outpatient in about 3 weeks  Discharge Diagnoses:  MAIN problem for hospitalization   Sepsis on admission, enterocolitis  Please see below for itemized issues addressed in [place]-refer to other progress notes for clarity if needed  Discharge Condition: Guarded  Diet recommendation: Renal otherwise liberalize  Filed Weights   03/07/22 0748 03/07/22 1251 03/09/22 0949  Weight: 53.7 kg 51.4 kg 50.3 kg    History of present illness:  The patient is a 71 y.o. female with medical history significant of ESRD on TTS dialysis, HTN, diabetes, HLD who presented with 2 weeks decreased PO intake, diarrhea, abdominal discomfort, globus sensation.  Imaging at presentation concerning for colitis/enteritis.  She's been treated with IV abx and has received dialysis while she's been here.  GI and SLP eval.   Palliative care was engaged as she was not improving throughout hospital course She became more responsive on 9/1 and was started on a diet on 9/2 Son is primary decision maker and Family is having difficulty accepting EOL status despite multiple meetings with palliative care If patient continues to be stable in the next several days can discharge home with home health as they have support and can further these conversations in the outpatient setting and anticipating discharging home tomorrow after dialysis as TOC is arranging  equipment and referring to home health [place] Hospital Course Assessments:  Enterocolitis Presumed infectious initially on admission, with frequent diarrhea  -C. 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 GI consulted, give foods/beverages she likes/tolerates Mentation improved some although still high risk for aspiration-placed on dysphagia 1 on 9/2--tolerating some diet, not eating much simplify as much as possible oral meds and she will need to take them with ice cream/pudding -Start PPI -Follow up with GI in the Outpatient setting    Elevated Troponin Cards indicated no work-up and flat troponins not consistent with ACS-echo grade 1 DD with EF 55% with no wall motion abnormality -Follow up with Cariology in the outpatient setting    ESRD (end stage renal disease) ([place]) On admit overloaded with mod B pleural effusions on CT scan despite recent GI illness with diarrhea, poor PO intake It has been difficult to manage during dialysis - hypotensive and needs IV albumin and fluids--yesterday she had 1300 cc pulled --became hypotensive -given 100 cc fluids--they did not need to give midodrine at HD  also on midodrine 5 mg 3 times daily Nephrology discussed with the patient that dialysis may not be a good long-term solution Family was able to see how she did on dialysis on 9/5 and 9/7 and she did well -These discussions can be carried out in the outpatient setting after dialysis tomorrow   Hypothyroidism TSH wnl with elevated free T4, unclear significance  Repeat TFTs in 4-6  weeks    Type 2 diabetes mellitus with chronic kidney disease on chronic dialysis, with long-term current use of insulin (HCC) -Hemoglobin A1c  7.7  (02/18/2022 ). Sugars ranging 117-186 We did discontinue d/10 and observe cbg and she was able to maintain her sugars Likely being discharged in the morning after dialysis   Essential Hypertension fluctuating bP, intermittent hypotension--periodically getting albumin at HD Continue to hold BP medications. continue midodrine 5 3 times daily and continue at discharge and hold her antihypertensive with lisinopril   Pain Unclear cause, see below Delirium? Fentanyl prn ordered, discontinued all opiates  Mentation has cleared some and she is more awake and per son she is closer to her baseline   Bilateral pleural effusion -Repeat CXR 8/24 with mild bilateral effusion, increased mild central pulm vascular congestion CT with moderate R and small L effusions on 8/27 -> suspected initially due to volume, though renal doesn't think she's overloaded -She was dialyzed and stable for D/C    Hypokalemia - Likely in part secondary to GI losses, resolved   Goals of care, counseling/discussion Will c/s palliative care to help start [place] conversations transitioned to DNR/DNI.  Several meetings have been accomplished with Pallaiitve and she remains DNR but not comfort Dr. Verlon Au personally have had several interactions and discussions with patient and family-she has improved some-if she stable tomorrow morning after dialysis she can be discharged strongly recommend palliative care following the patient at home but await further information from palliative care team from    Dementia without behavioral disturbance ([place]) -Delirium precautions--has had some improvement  Discharge Exam: Vitals:   03/09/22 1330 03/09/22 1340  BP: (!) 81/34 103/85  Pulse:  82  Resp:  14  Temp:    SpO2:     Subjective: Seen and examined at bedside this unit and had no complaints.  She appears stable and tolerated dialysis without issues.  She will be going home with her son and will need to have continued  palliative care discussions outpatient setting.  Examination: Physical Exam:  Constitutional: Thin frail cachectic Hispanic female in no acute distress sitting up in the dialysis bed without issues Respiratory: Diminished to auscultation bilaterally with coarse breath sounds, no wheezing, rales, rhonchi or crackles. Normal respiratory effort and patient is not tachypenic. No accessory muscle use.  Unlabored breathing Cardiovascular: RRR, no murmurs / rubs / gallops. S1 and S2 auscultated. No extremity edema. Abdomen: Soft, non-tender, non-distended. Bowel sounds positive.  GU: Deferred. Musculoskeletal: No clubbing / cyanosis of digits/nails. No joint deformity upper and lower extremities. Good ROM, no contractures. Normal strength and muscle tone.  Skin: No rashes, lesions, ulcers. No induration; Warm and dry.  Neurologic: CN 2-12 grossly intact with no focal deficits. Sensation intact in all 4 Extremities, DTR normal. Strength 5/5 in all 4. Romberg sign cerebellar reflexes not assessed.  Psychiatric: Normal judgment and insight. Alert and oriented x 3. Normal mood and appropriate affect.    Discharge Instructions   Discharge Instructions     (HEART FAILURE PATIENTS) Call MD:  Anytime you have any of the following symptoms: 1) 3 pound weight gain in 24 hours or 5 pounds in 1 week 2) shortness of breath, with or without a dry hacking cough 3) swelling in the hands, feet or stomach 4) if you have to sleep on extra pillows at night in order to breathe.   Complete by: As directed    Call MD for:  difficulty breathing, headache or visual disturbances   Complete  by: As directed    Call MD for:  extreme fatigue   Complete by: As directed    Call MD for:  hives   Complete by: As directed    Call MD for:  persistant dizziness or light-headedness   Complete by: As directed    Call MD for:  persistant nausea and vomiting   Complete by: As directed    Call MD for:  redness, tenderness, or signs  of infection (pain, swelling, redness, odor or green/yellow discharge around incision site)   Complete by: As directed    Call MD for:  severe uncontrolled pain   Complete by: As directed    Call MD for:  temperature >100.4   Complete by: As directed    Diet - low sodium heart healthy   Complete by: As directed    Dysphagia 1 Diet with Thin Liquids   Discharge instructions   Complete by: As directed    You were cared for by a hospitalist during your hospital stay. If you have any questions about your discharge medications or the care you received while you were in the hospital after you are discharged, you can call the unit and ask to speak with the hospitalist on call if the hospitalist that took care of you is not available. Once you are discharged, your primary care physician will handle any further medical issues. Please note that NO REFILLS for any discharge medications will be authorized once you are discharged, as it is imperative that you return to your primary care physician (or establish a relationship with a primary care physician if you do not have one) for your aftercare needs so that they can reassess your need for medications and monitor your lab values.  Follow up with PCP, Nephrology, and Palliative Care in the outpatient setting. Take all medications as prescribed. If symptoms change or worsen please return to the ED for evaluation   Discharge wound care:   Complete by: As directed    Frequent Repositioning   Increase activity slowly   Complete by: As directed       Allergies as of 03/09/2022   No Known Allergies      Medication List     STOP taking these medications    Cymbalta 60 MG capsule Generic drug: DULoxetine   ethyl chloride spray   gabapentin 300 MG capsule Commonly known as: NEURONTIN   Lantus 100 UNIT/ML injection Generic drug: insulin glargine   lisinopril 20 MG tablet Commonly known as: ZESTRIL       TAKE these medications     acetaminophen 325 MG tablet Commonly known as: TYLENOL Take 2 tablets (650 mg total) by mouth every 6 (six) hours as needed for mild pain (or Fever >/= 101).   atorvastatin 40 MG tablet Commonly known as: LIPITOR Tome 1 tableta (40 mg en total) por va oral diariamente. (Haga una visita a la oficina para obtener ms recargas) (TAKE 1 TABLET (40 MG TOTAL) BY MOUTH DAILY. (office visit for refills)) What changed: how much to take   feeding supplement (NEPRO CARB STEADY) Liqd Take 237 mLs by mouth See admin instructions. Take 1 can (237 ml) by mouth once daily on Tuesdays, Thursdays & Saturday morning. May take an additional can (237 ml) by mouth if needed for poor appetite. What changed: Another medication with the same name was added. Make sure you understand how and when to take each.   (feeding supplement) PROSource Plus liquid Take 30 mLs by mouth  2 (two) times daily between meals. What changed: You were already taking a medication with the same name, and this prescription was added. Make sure you understand how and when to take each.   midodrine 10 MG tablet Commonly known as: PROAMATINE Take 1 tablet (10 mg total) by mouth 3 (three) times daily with meals.   ondansetron 4 MG tablet Commonly known as: ZOFRAN Take 1 tablet (4 mg total) by mouth every 6 (six) hours as needed for nausea.   pantoprazole 40 MG tablet Commonly known as: PROTONIX Tome 1 tableta (40 mg en total) por va oral diariamente. (Take 1 tablet (40 mg total) by mouth daily.)   thiamine 100 MG tablet Commonly known as: VITAMIN B1 Tome 1 tableta (100 mg en total) por va oral diariamente. (Take 1 tablet (100 mg total) by mouth daily.)               Durable Medical Equipment  (From admission, onward)           Start     Ordered   03/09/22 1406  For home use only DME Other see comment  Once       Comments: Hoyer lift  pad  Question:  Length of Need  Answer:  6 Months   03/09/22 1406    03/07/22 1756  For home use only DME Hospital bed  Once       Question Answer Comment  Length of Need Lifetime   The above medical condition requires: Patient requires the ability to reposition frequently   Bed type Semi-electric   Hoyer Lift Yes   Trapeze Bar Yes   Support Surface: Alternating Pressure Pad and Pump      03/07/22 1756              Discharge Care Instructions  (From admission, onward)           Start     Ordered   03/09/22 0000  Discharge wound care:       Comments: Frequent Repositioning   03/09/22 1302           No Known Allergies  Follow-up Information     [name], MD Follow up.   Specialty: Family Medicine Contact information: [address] [place] [address] [place], [facility]. Go on 03/09/2022.   Why: arrive at 6:15 am for 6:30 chair time. Contact information: [place] [address] [place], [facility] Follow up.   Specialty: Home Health Services Why: home health RN and SW services will be provided by [facility] Contact information: [address] [place] [address] [phone]         [facility] [employer] Follow up.   Why: You can pick up hoyer pad from store Contact information: [address] [place],  [address]  [phone]                 The results of significant diagnostics from this hospitalization (including imaging, microbiology, ancillary and laboratory) are listed below for reference.    Significant Diagnostic Studies: DG CHEST PORT 1 VIEW  Result Date: 03/07/2022 CLINICAL DATA:  Pleural effusions EXAM: PORTABLE CHEST 1 VIEW COMPARISON:  None Available. FINDINGS: Normal mediastinum and cardiac silhouette. Normal pulmonary vasculature. No evidence of effusion, infiltrate, or pneumothorax. No acute bony abnormality. Vascular stent LEFT  axilla IMPRESSION: No  acute cardiopulmonary process. Electronically Signed   By: Suzy Bouchard M.D.   On: 03/07/2022 15:38   CT CHEST ABDOMEN PELVIS WO CONTRAST  Result Date: 02/26/2022 CLINICAL DATA:  Failure to thrive, diarrhea EXAM: CT CHEST, ABDOMEN AND PELVIS WITHOUT CONTRAST TECHNIQUE: Multidetector CT imaging of the chest, abdomen and pelvis was performed following the standard protocol without IV contrast. RADIATION DOSE REDUCTION: This exam was performed according to the departmental dose-optimization program which includes automated exposure control, adjustment of the mA and/or kV according to patient size and/or use of iterative reconstruction technique. COMPARISON:  CT chest, abdomen and pelvis dated February 18, 2022 FINDINGS: CT CHEST FINDINGS Cardiovascular: Normal heart size. Pericardial effusion. Severe left main and three-vessel coronary artery calcifications. Mitral annular calcifications. Normal caliber thoracic aorta with severe calcified plaque. Mediastinum/Nodes: Patulous esophagus. Thyroid is unremarkable. Mildly enlarged calcified mediastinal lymph nodes, likely sequela of prior granulomatous infection,, reference periaortic lymph node measuring 1.0 cm in short axis on series 3, image 18, unchanged when compared to prior exam. Lungs/Pleura: Central airways are patent. Moderate right and small left pleural effusions, unchanged when compared with prior exam. Bibasilar atelectasis. No evidence of pneumothorax. Musculoskeletal: No chest wall mass or suspicious bone lesions identified. CT ABDOMEN PELVIS FINDINGS Hepatobiliary: No focal liver abnormality. Gallstones with no gallbladder wall thickening. Unremarkable. No biliary ductal dilation. Linear hyperdense material seen in the distal common bile duct on series 3, image 60 and coronal series 6, image 54. Pancreas: Unremarkable. No pancreatic ductal dilatation or surrounding inflammatory changes. Spleen: Normal in size without focal abnormality.  Adrenals/Urinary Tract: Bilateral adrenal glands are unremarkable. Atrophic bilateral kidneys with no hydronephrosis. Thick-walled urinary bladder, unchanged when compared to prior. Stomach/Bowel: Stomach is within normal limits. Appendix appears normal. Mild diffuse colonic wall thickening. No evidence of obstruction or inflammatory change. Vascular/Lymphatic: Aortic atherosclerosis. No enlarged abdominal or pelvic lymph nodes. Reproductive: Calcifications of the uterus.  No adnexal masses. Other: Small fat containing ventral abdominal wall hernias. Abdominopelvic ascites. Soft tissue anasarca. Musculoskeletal: No acute or significant osseous findings. IMPRESSION: 1. Linear hyperdensity of the distal common bile duct bile duct, concerning for layering small common bile duct stones. Could be further evaluated with MRCP. 2. Mild diffuse colonic wall thickening, similar to prior, can be seen in the setting of colitis. 3. Thick-walled urinary bladder, unchanged when compared to prior. 4. Unchanged moderate right and small left pleural effusions with adjacent atelectasis. 5. Aortic Atherosclerosis (ICD10-I70.0). Electronically Signed   By: Yetta Glassman M.D.   On: 02/26/2022 19:42   DG CHEST PORT 1 VIEW  Result Date: 02/23/2022 CLINICAL DATA:  142230; wheezing EXAM: PORTABLE CHEST 1 VIEW COMPARISON:  None Available. FINDINGS: The heart size and mediastinal contours are mildly enlarged, stable. Stable atheromatous calcifications of the arch of the aorta. There is bilateral pleural effusion seen greater on the right with likely superimposed bibasilar atelectasis and has increased in the interim as well. There is a mild central pulmonary vascular congestion seen and is more conspicuous in the present study. The visualized skeletal structures are unremarkable. IMPRESSION: Mild bilateral pleural effusion greater on the right with superimposed bibasilar atelectasis and have increased in the interim. There is mild  central pulmonary vascular congestion seen and has increased in the interim as well. Electronically Signed   By: Frazier Richards M.D.   On: 02/23/2022 08:17   DG Abd 1 View  Result Date: 02/22/2022 CLINICAL DATA:  Abdominal pain. EXAM: ABDOMEN - 1 VIEW COMPARISON:  None  Available. FINDINGS: Oral contrast material is seen in distal small bowel loops. No evidence of dilated bowel loops. Aortoiliac vascular calcification noted. IMPRESSION: Unremarkable bowel gas pattern, with oral contrast material and nondilated distal small bowel loops. Electronically Signed   By: Marlaine Hind M.D.   On: 02/22/2022 21:33   DG ESOPHAGUS W SINGLE CM (SOL OR THIN BA)  Result Date: 02/22/2022 CLINICAL DATA:  Patient presented to ED with 2 week history of reduced p.o. intake. Patient referred for single contrast medium esophagram due to fluid-filled esophagus noted on previous CT. EXAM: ESOPHAGUS/BARIUM SWALLOW/TABLET STUDY TECHNIQUE: Single contrast examination was performed using thin liquid barium. This exam was performed by Narda Rutherford, NP, and was supervised and interpreted by Nelson Chimes. MD. FLUOROSCOPY: Radiation Exposure Index (as provided by the fluoroscopic device): 30.20 mGy Kerma COMPARISON:  None Available. FINDINGS: Limited examination due to the patient's inability to stand or reposition on fluoroscopy table and unable to follow commands. Swallowing: Appears normal. No vestibular penetration or aspiration seen. Pharynx: Unremarkable. Esophagus: No esophageal mass or stricture. Esophageal motility: Age-related esophageal dysmotility with decreased primary stripping wave and increased tertiary contractions. Hiatal Hernia: None. Gastroesophageal reflux: None visualized. Ingested 59mm barium tablet: Not given Other: None. IMPRESSION: Limited examination due to patient's inability to follow commands. Age-related esophageal dysmotility with poor primary contractions an abnormal disordered tertiary contractions with delayed  passage of barium. No mass or stricture. Read by: Narda Rutherford, AGNP-BC Electronically Signed   By: Nelson Chimes M.D.   On: 02/22/2022 13:41   ECHOCARDIOGRAM COMPLETE  Result Date: 02/19/2022    ECHOCARDIOGRAM REPORT   Patient Name:   SADHANA FRATER Date of Exam: 02/19/2022 Medical Rec #:  [id number]              Height:       63.0 in Accession #:    [id number]             Weight:       132.3 lb Date of Birth:  04/28/1951              BSA:          1.622 m Patient Age:    16 years               BP:           103/34 mmHg Patient Gender: F                      HR:           79 bpm. Exam Location:  Inpatient Procedure: 2D Echo, Cardiac Doppler, Color Doppler and Intracardiac            Opacification Agent Indications:    Elevated troponin  History:        Patient has prior history of Echocardiogram examinations, most                 recent 02/13/2017. Risk Factors:Hypertension, Diabetes and                 Dyslipidemia.  Sonographer:    Clayton Lefort RDCS (AE) Referring Phys: [id number] [facility]  Sonographer Comments: Suboptimal apical window. IMPRESSIONS  1. Left ventricular ejection fraction, by estimation, is 55%. The left ventricle has normal function. The left ventricle has no regional wall motion abnormalities. Left ventricular diastolic parameters are consistent with Grade I diastolic dysfunction (impaired relaxation). Elevated left atrial pressure.  2. Right ventricular systolic function is normal.  The right ventricular size is normal.  3. The mitral valve is abnormal. Mild to moderate mitral valve regurgitation. No evidence of mitral stenosis.  4. The aortic valve is tricuspid. There is mild calcification of the aortic valve. There is mild thickening of the aortic valve. Aortic valve regurgitation is mild to moderate. No aortic stenosis is present.  5. The inferior vena cava is normal in size with greater than 50% respiratory variability, suggesting right atrial pressure of 3 mmHg. FINDINGS  Left  Ventricle: Left ventricular ejection fraction, by estimation, is 55%. The left ventricle has normal function. The left ventricle has no regional wall motion abnormalities. Definity contrast agent was given IV to delineate the left ventricular endocardial borders. The left ventricular internal cavity size was normal in size. There is no left ventricular hypertrophy. Left ventricular diastolic parameters are consistent with Grade I diastolic dysfunction (impaired relaxation). Elevated left atrial pressure. Right Ventricle: The right ventricular size is normal. Right vetricular wall thickness was not well visualized. Right ventricular systolic function is normal. Left Atrium: Left atrial size was normal in size. Right Atrium: Right atrial size was normal in size. Pericardium: There is no evidence of pericardial effusion. Mitral Valve: The mitral valve is abnormal. There is mild thickening of the mitral valve leaflet(s). There is mild calcification of the mitral valve leaflet(s). Mild mitral annular calcification. Mild to moderate mitral valve regurgitation. No evidence of mitral valve stenosis. Tricuspid Valve: The tricuspid valve is normal in structure. Tricuspid valve regurgitation is not demonstrated. No evidence of tricuspid stenosis. Aortic Valve: The aortic valve is tricuspid. There is mild calcification of the aortic valve. There is mild thickening of the aortic valve. There is mild aortic valve annular calcification. Aortic valve regurgitation is mild to moderate. Aortic regurgitation PHT measures 521 msec. No aortic stenosis is present. Aortic valve mean gradient measures 6.0 mmHg. Aortic valve peak gradient measures 9.9 mmHg. Aortic valve area, by VTI measures 1.50 cm. Pulmonic Valve: The pulmonic valve was not well visualized. Pulmonic valve regurgitation is not visualized. No evidence of pulmonic stenosis. Aorta: The aortic root is normal in size and structure. Venous: The inferior vena cava is normal in  size with greater than 50% respiratory variability, suggesting right atrial pressure of 3 mmHg. IAS/Shunts: No atrial level shunt detected by color flow Doppler.  LEFT VENTRICLE PLAX 2D LVIDd:         5.00 cm   Diastology LVIDs:         3.70 cm   LV e' medial:    5.00 cm/s LV PW:         1.00 cm   LV E/e' medial:  17.6 LV IVS:        0.90 cm   LV e' lateral:   4.57 cm/s LVOT diam:     1.90 cm   LV E/e' lateral: 19.3 LV SV:         46 LV SV Index:   28 LVOT Area:     2.84 cm  RIGHT VENTRICLE RV Basal diam:  3.20 cm RV S prime:     13.80 cm/s TAPSE (M-mode): 2.3 cm LEFT ATRIUM             Index        RIGHT ATRIUM           Index LA diam:        3.90 cm 2.40 cm/m   RA Area:     10.50 cm LA Vol (A2C):  48.7 ml 30.02 ml/m  RA Volume:   21.80 ml  13.44 ml/m LA Vol (A4C):   48.3 ml 29.78 ml/m LA Biplane Vol: 49.2 ml 30.33 ml/m  AORTIC VALVE AV Area (Vmax):    1.48 cm AV Area (Vmean):   1.49 cm AV Area (VTI):     1.50 cm AV Vmax:           157.00 cm/s AV Vmean:          109.000 cm/s AV VTI:            0.305 m AV Peak Grad:      9.9 mmHg AV Mean Grad:      6.0 mmHg LVOT Vmax:         81.80 cm/s LVOT Vmean:        57.200 cm/s LVOT VTI:          0.161 m LVOT/AV VTI ratio: 0.53 AI PHT:            521 msec  AORTA Ao Root diam: 3.00 cm Ao Asc diam:  3.10 cm MITRAL VALVE MV Area (PHT): 4.60 cm     SHUNTS MV Decel Time: 165 msec     Systemic VTI:  0.16 m MV E velocity: 88.20 cm/s   Systemic Diam: 1.90 cm MV A velocity: 106.00 cm/s MV E/A ratio:  0.83 Carlyle Dolly MD Electronically signed by Carlyle Dolly MD Signature Date/Time: 02/19/2022/5:29:54 PM    Final    CT CHEST ABDOMEN PELVIS W CONTRAST  Result Date: 02/18/2022 CLINICAL DATA:  Sepsis. Diminished appetite for 2 weeks. Increased sweating. EXAM: CT CHEST, ABDOMEN, AND PELVIS WITH CONTRAST TECHNIQUE: Multidetector CT imaging of the chest, abdomen and pelvis was performed following the standard protocol during bolus administration of intravenous contrast.  RADIATION DOSE REDUCTION: This exam was performed according to the departmental dose-optimization program which includes automated exposure control, adjustment of the mA and/or kV according to patient size and/or use of iterative reconstruction technique. CONTRAST:  10mL OMNIPAQUE IOHEXOL 300 MG/ML  SOLN COMPARISON:  Chest radiograph earlier today. Abdominopelvic CT 02/16/2018 FINDINGS: CT CHEST FINDINGS Cardiovascular: Mild cardiomegaly. There are coronary artery and pericardial calcifications. No pericardial effusion. Moderate atherosclerosis of the thoracic aorta without evidence of acute aortic finding. Vascular stent arising from the left subclavian artery coursing superiorly is only partially included in the field of view, but may be occluded, image 1 series 3. There is no obvious pulmonary embolus in the central most pulmonary arteries, although assessment for pulmonary embolus is limited. Mediastinum/Nodes: Scattered small mediastinal lymph nodes, not enlarged by size criteria. There are mildly prominent bilateral hilar nodes. The esophagus is patulous and contains scattered intraluminal fluid. No wall thickening. No thyroid nodule. Lungs/Pleura: Moderate layering right pleural effusion with associated compressive atelectasis. Moderate left pleural effusion is partially loculated and courses into the fissure. There is associated compressive atelectasis. Calcified granuloma in the right upper lobe. Mild hazy ground-glass opacity in the dependent lungs. Mild left lower lobe bronchial thickening with occasional mucous plugging. No endobronchial lesion or debris in the more proximal bronchi. Musculoskeletal: Scattered Schmorl's nodes throughout the thoracic spine. There are no acute or suspicious osseous abnormalities. CT ABDOMEN PELVIS FINDINGS Hepatobiliary: No focal hepatic abnormality. Gallbladder physiologically distended, no calcified stone. No biliary dilatation. Pancreas: No ductal dilatation or  inflammation. Spleen: Normal in size without focal abnormality. Adrenals/Urinary Tract: No adrenal nodule. Sequela of chronic renal disease with native renal atrophy. No hydronephrosis or renal inflammation. The urinary bladder is essentially  empty, but thick walled. Stomach/Bowel: Fluid within the stomach, no definite gastric wall thickening. Occasional fluid-filled loops of small bowel. There is suggestion of mild diffuse small bowel wall thickening. No obstruction. There is colonic wall thickening with mild pericolonic edema involving the transverse colon. Possible additional areas of colonic inflammation in the sigmoid. No bowel pneumatosis. The appendix is not visualized. Vascular/Lymphatic: Advanced aortic atherosclerosis. Moderate atherosclerosis of aortic branches. No evidence of embolic disease. There may be areas of stenosis of the distal SMA related to atheromatous plaque. No bulky adenopathy. Reproductive: Diffuse uterine vascular calcifications. No adnexal mass. Other: Lower abdominal ventral abdominal wall hernia contains only fat. Small right inguinal hernia contains small amount of free fluid. No free air or ascites. No abdominopelvic collection. Musculoskeletal: Vague sclerotic focus in the right iliac bone is unchanged. Remote right posterior lower rib fracture. IMPRESSION: 1. Colonic wall thickening with mild pericolonic edema involving the transverse colon, suspicious for colitis. Possible additional areas of colonic inflammation in the sigmoid colon. There is also mild diffuse small bowel wall thickening small possible enteritis. 2. Moderate layering right pleural effusion with associated compressive atelectasis. Moderate left pleural effusion is partially loculated and courses into the fissure. 3. Mild hazy ground-glass opacity in the dependent lungs, atelectasis or mild pulmonary edema. 4. Patulous esophagus with scattered intraluminal fluid, suggesting reflux. 5. Thick-walled urinary bladder,  chronic. 6. Additional chronic findings are stable as described. Aortic Atherosclerosis (ICD10-I70.0). Electronically Signed   By: Keith Rake M.D.   On: 02/18/2022 21:22   DG Chest 2 View  Result Date: 02/18/2022 CLINICAL DATA:  Not eating.  Wheezing. EXAM: CHEST - 2 VIEW COMPARISON:  09/06/2019. FINDINGS: Cardiac silhouette is mildly enlarged. No mediastinal or hilar masses. No evidence of adenopathy. Small effusions. Mild lung base opacity consistent with atelectasis. Remainder of the lungs is clear. No pneumothorax. Stable left subclavian to axillary stent. Skeletal structures are intact. IMPRESSION: 1. No acute cardiopulmonary disease. 2. Small effusions with mild basilar atelectasis. No convincing pneumonia or pulmonary edema. Electronically Signed   By: Lajean Manes M.D.   On: 02/18/2022 16:23   CT Head Wo Contrast  Result Date: 02/18/2022 CLINICAL DATA:  Mental status change, unknown cause EXAM: CT HEAD WITHOUT CONTRAST TECHNIQUE: Contiguous axial images were obtained from the base of the skull through the vertex without intravenous contrast. RADIATION DOSE REDUCTION: This exam was performed according to the departmental dose-optimization program which includes automated exposure control, adjustment of the mA and/or kV according to patient size and/or use of iterative reconstruction technique. COMPARISON:  Head CT 07/15/2020 FINDINGS: Brain: Stable degree of atrophy and chronic small vessel ischemia from prior exam. Remote lacunar infarcts in the right external capsule. Remote infarct in the right cerebellum and high right parietal lobe. No evidence of acute ischemia. No hemorrhage or subdural collection. No hydrocephalus. No midline shift or mass effect. Enlarged partially empty sella, unchanged. Vascular: Atherosclerosis of skullbase vasculature without hyperdense vessel or abnormal calcification. Skull: No fracture or focal lesion. Sinuses/Orbits: Chronic mucosal thickening of ethmoid air  cells with frothy debris in the sphenoid sinus. Postsurgical change in both globes. Other: None. IMPRESSION: 1. No acute intracranial abnormality. 2. Stable atrophy, chronic small vessel ischemia, and remote infarcts. Electronically Signed   By: Keith Rake M.D.   On: 02/18/2022 16:22    Microbiology: No results found for this or any previous visit (from the past 240 hour(s)).   Labs: Basic Metabolic Panel: Recent Labs  Lab 03/05/22 0054 03/06/22 0059 03/07/22 7106  03/08/22 0204 03/09/22 0117  NA 132* 131* 132* 136 133*  K 3.7 3.9 4.3 4.4 4.2  CL 94* 92* 93* 96* 92*  CO2 28 27 25 29 27   GLUCOSE 210* 177* 131* 144* 237*  BUN 7* 14 22 15  26*  CREATININE 4.19* 5.73* 8.03* 5.29* 7.01*  CALCIUM 9.6 10.2 9.7 9.9 10.2  MG  --   --   --   --  1.9  PHOS  --  5.4* 6.4* 5.1* 6.2*   Liver Function Tests: Recent Labs  Lab 03/06/22 0059 03/07/22 0743 03/08/22 0204 03/09/22 0117  AST  --   --   --  28  ALT  --   --   --  16  ALKPHOS  --   --   --  132*  BILITOT  --   --   --  0.6  PROT  --   --   --  6.8  ALBUMIN 3.0* 2.7* 2.9* 2.8*   No results for input(s): "LIPASE", "AMYLASE" in the last 168 hours. No results for input(s): "AMMONIA" in the last 168 hours. CBC: Recent Labs  Lab 03/05/22 0054 03/06/22 0059 03/07/22 0743 03/08/22 0204 03/09/22 0117  WBC 5.6 6.1 6.3 4.7 6.4  NEUTROABS 3.6 3.5  --  2.7 3.9  HGB 12.3 13.0 12.0 12.8 12.1  HCT 36.9 40.0 37.0 39.9 37.1  MCV 97.9 100.0 100.5* 100.3* 98.9  PLT 134* 139* 164 157 159   Cardiac Enzymes: No results for input(s): "CKTOTAL", "CKMB", "CKMBINDEX", "TROPONINI" in the last 168 hours. BNP: BNP (last 3 results) Recent Labs    02/18/22 1405  BNP 1,707.5*    ProBNP (last 3 results) No results for input(s): "PROBNP" in the last 8760 hours.  CBG: Recent Labs  Lab 03/08/22 1140 03/08/22 1538 03/08/22 2010 03/09/22 0004 03/09/22 0506  GLUCAP 184* 186* 223* 218* 159*   Signed:  Raiford Noble, DO   [facility] 03/09/2022, 6:41 PM

## 2022-03-07 NOTE — Progress Notes (Signed)
PROGRESS NOTE    Sarah Kelley  HUD:[id number] DOB: July 28, 1950 DOA: 02/18/2022 PCP: [name], MD  Chief Complaint  Patient presents with   Failure To Thrive    Brief Narrative:  71 y.o. female with medical history significant of ESRD on TTS dialysis, HTN, diabetes, HLD who presented with 2 weeks decreased PO intake, diarrhea, abdominal discomfort, globus sensation.  Imaging at presentation concerning for colitis/enteritis.  She's been treated with IV abx and has received dialysis while she's been here.  GI and SLP eval.   Palliative care was engaged as she was not improving throughout hospital course She became more responsive on 9/1 and was started on a diet on 9/2 Son is primary decision maker and Family is having difficulty accepting EOL status despite multiple meetings with palliative care If patient continues to be stable in the next several days can discharge home with home health as they have support and can further these conversations in the outpatient setting      Assessment & Plan:   Principal Problem:   Enterocolitis Active Problems:   Dysphagia   Goals of care, counseling/discussion   ESRD (end stage renal disease) ([place])   Elevated troponin   Essential hypertension   Type 2 diabetes mellitus with chronic kidney disease on chronic dialysis, with long-term current use of insulin (HCC)   Hypothyroidism   Bilateral pleural effusion   Pain   Dementia without behavioral disturbance (HCC)   Hypokalemia   Malnutrition of moderate degree   Assessment and Plan: Enterocolitis Presumed infectious initially on admission, with frequent diarrhea --C. 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 GI consulted, give foods/beverages she likes/tolerates Mentation improved some although still high risk  for aspiration-placed on dysphagia 1 on 9/2--tolerating some diet, not eating much simplify as much as possible oral meds and she will need to take them with ice cream/pudding  Elevated troponin Cards indicated no work-up and flat troponins not consistent with ACS-echo grade 1 DD with EF 55% with no wall motion abnormality  ESRD (end stage renal disease) ([place]) On admit overloaded with mod B pleural effusions on CT scan despite recent GI illness with diarrhea, poor PO intake It has been difficult to manage during dialysis - hypotensive and needs IV albumin and fluids--today she had 1300 cc pulled --became hypotensive -given 100 cc fluids--they did not need to give midodrine at HD  also on midodrine 5 mg 3 times daily Nephrology discussed with the patient that dialysis may not be a good long-term solution Family was able to see how she did on dialysis on 9/4 These discussions can be carried out in the outpatient setting  Hypothyroidism TSH wnl with elevated free T4, unclear significance   Type 2 diabetes mellitus with chronic kidney disease on chronic dialysis, with long-term current use of insulin ([place]) -Hemoglobin A1c 7.7  (02/18/2022 ). Sugars ranging 1 30-1 60 We did discontinue d/10 and observe cbg and she was able to maintain her sugars Able to d/c if  safe plan in the next several days  Essential hypertension fluctuating bP, intermittent hypotension--periodically getting albumin at HD Continue to hold BP medications. continue midodrine 5 3 times daily  Pain Unclear cause, see below Delirium? Fentanyl prn ordered, discontinued all opiates  Mentation has cleared some  Bilateral pleural effusion Repeat CXR 8/24 with mild bilateral effusion, increased mild central pulm vascular congestion  CT with moderate R and small L effusions on 8/27 -> suspected initially due to volume, though renal doesn't think she's overloaded  Hypokalemia - Likely in part secondary to GI losses,  resolved  Goals of care, counseling/discussion Will c/s palliative care to help start [place] conversations transitioned to DNR/DNI.  Several meetings have been accomplished with Pallaiitve and she remains DNR but not comfort I personally have had several interactions and discussions with patient and family-she has improved some-if she stabilizes in the next day or so I would strongly recommend palliative care following the patient at home but await further information from palliative care team from today  Dementia without behavioral disturbance ([place]) Delirium precautions--has had some improvement     DVT prophylaxis: heparin Code Status: full Family Communication:  See IPAL note 03/05/22 and palliative notes  Disposition:   Status is: Inpatient Remains inpatient appropriate because: need for continued management   Consultants:  Renal GI  Procedures:  none  Antimicrobials:  Anti-infectives (From admission, onward)    Start     Dose/Rate Route Frequency Ordered Stop   02/27/22 2200  cefTRIAXone (ROCEPHIN) 2 g in sodium chloride 0.9 % 100 mL IVPB        2 g 200 mL/hr over 30 Minutes Intravenous Every 24 hours 02/27/22 1814 02/28/22 2250   02/18/22 2230  cefTRIAXone (ROCEPHIN) 2 g in sodium chloride 0.9 % 100 mL IVPB  Status:  Discontinued        2 g 200 mL/hr over 30 Minutes Intravenous Every 24 hours 02/18/22 2217 02/27/22 1814   02/18/22 2230  metroNIDAZOLE (FLAGYL) IVPB 500 mg  Status:  Discontinued        500 mg 100 mL/hr over 60 Minutes Intravenous Every 12 hours 02/18/22 2217 02/28/22 1322       Subjective:  does not verbalize but does seem to understand-only nods and moans Even when Spanish interpreter is used-seen on HD unit today see above documentation   Objective: Vitals:   03/07/22 1208 03/07/22 1221 03/07/22 1251 03/07/22 1410  BP: (!) 96/44 (!) 105/56    Pulse: 74 72  71  Resp: 13 13    Temp:  98.3 F (36.8 C)  98 F (36.7 C)  TempSrc:    Tympanic   SpO2: 98% 98%  100%  Weight:   51.4 kg   Height:        Intake/Output Summary (Last 24 hours) at 03/07/2022 1726 Last data filed at 03/07/2022 1221 Gross per 24 hour  Intake --  Output 1200 ml  Net -1200 ml    Filed Weights   03/04/22 1300 03/07/22 0748 03/07/22 1251  Weight: 53.1 kg 53.7 kg 51.4 kg   Examination:  No icterus no pallor arcus senilis, cachectic--less sleepy Abdomen scaphoid Chest clear S1-S2 no murmur No lower extremity edema  legs have heel protectors on them  Data Reviewed: I have personally reviewed following labs and imaging studies  CBC: Recent Labs  Lab 03/02/22 0650 03/05/22 0054 03/06/22 0059 03/07/22 0743  WBC 5.1 5.6 6.1 6.3  NEUTROABS  --  3.6 3.5  --   HGB 12.6 12.3 13.0 12.0  HCT 38.4 36.9 40.0 37.0  MCV 100.8* 97.9 100.0 100.5*  PLT 128* 134* 139* 164     Basic Metabolic Panel: Recent Labs  Lab 03/02/22 0650 03/05/22 0054 03/06/22 0059 03/07/22 0743  NA 132* 132* 131* 132*  K 4.1 3.7 3.9 4.3  CL 91* 94* 92* 93*  CO2 26 28 27  25  GLUCOSE 181* 210* 177* 131*  BUN 14 7* 14 22  CREATININE 6.83* 4.19* 5.73* 8.03*  CALCIUM 9.7 9.6 10.2 9.7  PHOS 5.0*  --  5.4* 6.4*     GFR: Estimated Creatinine Clearance: 4.5 mL/min (A) (by C-G formula based on SCr of 8.03 mg/dL (H)).  Liver Function Tests: Recent Labs  Lab 03/02/22 0650 03/06/22 0059 03/07/22 0743  ALBUMIN 2.7* 3.0* 2.7*    Recent Labs  Lab 03/06/22 0411 03/06/22 0616 03/06/22 0748 03/06/22 1152 03/06/22 1830  GLUCAP 167* 148* 149* 163* 176*    Radiology Studies: DG CHEST PORT 1 VIEW  Result Date: 03/07/2022 CLINICAL DATA:  Pleural effusions EXAM: PORTABLE CHEST 1 VIEW COMPARISON:  None Available. FINDINGS: Normal mediastinum and cardiac silhouette. Normal pulmonary vasculature. No evidence of effusion, infiltrate, or pneumothorax. No acute bony abnormality. Vascular stent LEFT axilla IMPRESSION: No acute cardiopulmonary process. Electronically Signed   By:  Suzy Bouchard M.D.   On: 03/07/2022 15:38    Scheduled Meds:  (feeding supplement) PROSource Plus  30 mL Oral BID BM   Chlorhexidine Gluconate Cloth  6 each Topical Q0600   feeding supplement  1 Container Oral TID BM   heparin  5,000 Units Subcutaneous Q8H   midodrine  10 mg Oral TID WC   thiamine  100 mg Oral Daily   Continuous Infusions:   LOS: 16 days   Time spent: 54  Nita Sells, MD [facility]   To contact the attending provider between 7A-7P or the covering provider during after hours 7P-7A, please log into the web site [email or web address] and access using universal [place] password for that web site. If you do not have the password, please call the hospital operator.  03/07/2022, 5:26 PM

## 2022-03-07 NOTE — Progress Notes (Signed)
  [facility] Progress Note   Subjective:  Seen on HD - 2L UFG and tolerating so far. Pleasantly confused and doesn't talk much - her baseline. Continues to have issues with eating/taking medications per notes.  Objective Vitals:   03/07/22 0353 03/07/22 0708 03/07/22 0748 03/07/22 0801  BP: (!) 118/36  (!) 115/42 111/79  Pulse: 60  (!) 55 (!) 48  Resp: 14 (!) 21 19 13   Temp: 98.6 F (37 C)  98.4 F (36.9 C)   TempSrc: Axillary  Oral   SpO2: 97%  96% 97%  Weight:   53.7 kg   Height:       Physical Exam General: Chronically frail and confused woman, NAD. Room air. Heart: Bradycardic, no murmur Lungs: CTA anteriorly Abdomen: soft Extremities: No LE edema; bandages to B ankles Dialysis Access: L AVG + bruit  Additional Objective Labs: Basic Metabolic Panel: Recent Labs  Lab 03/02/22 0650 03/05/22 0054 03/06/22 0059  NA 132* 132* 131*  K 4.1 3.7 3.9  CL 91* 94* 92*  CO2 26 28 27   GLUCOSE 181* 210* 177*  BUN 14 7* 14  CREATININE 6.83* 4.19* 5.73*  CALCIUM 9.7 9.6 10.2  PHOS 5.0*  --  5.4*   Liver Function Tests: Recent Labs  Lab 03/02/22 0650 03/06/22 0059  ALBUMIN 2.7* 3.0*   CBC: Recent Labs  Lab 03/02/22 0650 03/05/22 0054 03/06/22 0059 03/07/22 0743  WBC 5.1 5.6 6.1 6.3  NEUTROABS  --  3.6 3.5  --   HGB 12.6 12.3 13.0 12.0  HCT 38.4 36.9 40.0 37.0  MCV 100.8* 97.9 100.0 100.5*  PLT 128* 134* 139* 164   Medications:   (feeding supplement) PROSource Plus  30 mL Oral BID BM   Chlorhexidine Gluconate Cloth  6 each Topical Q0600   feeding supplement  1 Container Oral TID BM   heparin  5,000 Units Subcutaneous Q8H   midodrine  10 mg Oral TID WC   thiamine  100 mg Oral Daily    Dialysis Orders: [facility] 4h  400/500  52kg  2/2  P2  Heparin 2000 units IV LUA AVG - Hectoral 37mcg IV q HD  Assessment/Plan: 1. Enterocolitis with diarrhea and dysphagia/reduced PO intake: S/p course of IV Ceftriaxone + metronidazole. Negative GI panel.   GI consulted, now signed off. 2. ESRD: Continue HD on TTS schedule for now - HD today. K better now, follow. 3. HTN/volume: BP drops q HD, now on midodrine 10mg  TIW (although issues tolerating her meds at times). Last CXR with pulm edema/effusions, will get another today. 4. Anemia: Hgb 12, no ESA needed. 5. Secondary hyperparathyroidism: Ca high, Phos ok - VDRA on hold. 6. Nutrition: Alb low, but somewhat improved. 7. Severe dementia: Ongoing issue prior to admit, dialysis is not contributing to QOL. Palliative care has been extremely helpful, but seems like family is not yet ready to pursue comfort care measures. She remains DNR. Repeat family meeting scheduled for 9/5.  Veneta Penton, PA-C 03/07/2022, 8:37 AM  [employer]

## 2022-03-07 NOTE — Progress Notes (Signed)
Patient ID: [name], female   DOB: Apr 20, 1951, 71 y.o.   MRN: [id number]    Progress Note from the Palliative Medicine Team at [facility]   Patient Name: Sarah Kelley        Date: 03/07/2022 DOB: 06/17/1951  Age: 71 y.o. MRN#: [id number] Attending Physician: Nita Sells, MD Primary Care Physician: [name], MD Admit Date: 02/18/2022   Medical records reviewed   71 y.o. 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 Pt presents to ED with 2 week h/o reduced PO intake, diaphoresis over past 3-4 days and profuse diarrhea - GI consulted stool studies (-) though (+) colitis on BS antibiotics. (+) Dysphagia. Palliative care has been asked to get involved to further address goals of care in the setting of overalll decline and failure to thrive.     Today is day 16 of this hospital stay and patient continues with overall failure to thrive.    This NP assessed patient at the bedside as a follow up for palliative medicine needs and emotional support.  Patient remains lethargic with poor p.o. intake.       I met with patient's son/ Marcial at bedside, along with Delice Lesch spanish interpretor as scheduled for conversation regarding plan of care.  Again education offered today on patient's current medical situation; specific to end-stage renal disease dialysis dependent and end-stage dementia.  Education offered on the natural trajectory of dementia as it relates to continued physical, functional and cognitive decline.  Education offered on concept specific to adult failure to thrive and the limitations of medical interventions to prolong life when the body does fail to thrive.  Detailed conversation regarding patient's poor po intake and high risk of aspiration.  Education offered on various in-home services specific to home health  versus hospice.  Education offered on hospice benefit;  philosophy and eligibility.  Emotional support offered.  Patient's son understands the seriousness of his mother's current medical situation but tells me he "will never say stop dialysis".  He expresses that this is "for God to decide".  Patient's son tells me that his family is Catholic  Discussion had around possibility of patient's son contacting a [place] priest, for support and assistance as he continues to be faced with treatment option decisions, advanced directive decisions and anticipatory care needs.  Education offered today regarding  the importance of continued conversation with family and their  medical providers regarding overall plan of care and treatment options,  ensuring decisions are within the context of the patients values and GOCs.  Plan is to meet tomorrow morning at 4:00pm  with [place] interpreter.  Hopefully some of the patient's other children will be present for this conversation.  Questions and concerns addressed   Discussed with Dr Georgie Chard NP  Palliative Medicine Team Team Phone # [phone] Pager [phone]

## 2022-03-07 NOTE — Progress Notes (Signed)
SLP Cancellation Note  Patient Details Name: Lenetta Piche MRN: [id number] DOB: 05/06/1951   Cancelled treatment:       Reason Eval/Treat Not Completed: Patient at procedure or test/unavailable. Pt in dialysis   Lynel Forester, Katherene Ponto 03/07/2022, 8:40 AM

## 2022-03-08 ENCOUNTER — Other Ambulatory Visit (HOSPITAL_COMMUNITY): Payer: Self-pay

## 2022-03-08 DIAGNOSIS — J90 Pleural effusion, not elsewhere classified: Secondary | ICD-10-CM

## 2022-03-08 LAB — CBC WITH DIFFERENTIAL/PLATELET
Abs Immature Granulocytes: 0.02 10*3/uL (ref 0.00–0.07)
Basophils Absolute: 0.1 10*3/uL (ref 0.0–0.1)
Basophils Relative: 2 %
Eosinophils Absolute: 0.2 10*3/uL (ref 0.0–0.5)
Eosinophils Relative: 3 %
HCT: 39.9 % (ref 36.0–46.0)
Hemoglobin: 12.8 g/dL (ref 12.0–15.0)
Immature Granulocytes: 0 %
Lymphocytes Relative: 22 %
Lymphs Abs: 1 10*3/uL (ref 0.7–4.0)
MCH: 32.2 pg (ref 26.0–34.0)
MCHC: 32.1 g/dL (ref 30.0–36.0)
MCV: 100.3 fL — ABNORMAL HIGH (ref 80.0–100.0)
Monocytes Absolute: 0.7 10*3/uL (ref 0.1–1.0)
Monocytes Relative: 15 %
Neutro Abs: 2.7 10*3/uL (ref 1.7–7.7)
Neutrophils Relative %: 58 %
Platelets: 157 10*3/uL (ref 150–400)
RBC: 3.98 MIL/uL (ref 3.87–5.11)
RDW: 15.9 % — ABNORMAL HIGH (ref 11.5–15.5)
WBC: 4.7 10*3/uL (ref 4.0–10.5)
nRBC: 0 % (ref 0.0–0.2)

## 2022-03-08 LAB — GLUCOSE, CAPILLARY
Glucose-Capillary: 117 mg/dL — ABNORMAL HIGH (ref 70–99)
Glucose-Capillary: 135 mg/dL — ABNORMAL HIGH (ref 70–99)
Glucose-Capillary: 162 mg/dL — ABNORMAL HIGH (ref 70–99)
Glucose-Capillary: 184 mg/dL — ABNORMAL HIGH (ref 70–99)
Glucose-Capillary: 186 mg/dL — ABNORMAL HIGH (ref 70–99)
Glucose-Capillary: 223 mg/dL — ABNORMAL HIGH (ref 70–99)

## 2022-03-08 LAB — RENAL FUNCTION PANEL
Albumin: 2.9 g/dL — ABNORMAL LOW (ref 3.5–5.0)
Anion gap: 11 (ref 5–15)
BUN: 15 mg/dL (ref 8–23)
CO2: 29 mmol/L (ref 22–32)
Calcium: 9.9 mg/dL (ref 8.9–10.3)
Chloride: 96 mmol/L — ABNORMAL LOW (ref 98–111)
Creatinine, Ser: 5.29 mg/dL — ABNORMAL HIGH (ref 0.44–1.00)
GFR, Estimated: 8 mL/min — ABNORMAL LOW (ref >60)
Glucose, Bld: 144 mg/dL — ABNORMAL HIGH (ref 70–99)
Phosphorus: 5.1 mg/dL — ABNORMAL HIGH (ref 2.5–4.6)
Potassium: 4.4 mmol/L (ref 3.5–5.1)
Sodium: 136 mmol/L (ref 135–145)

## 2022-03-08 MED ORDER — ACETAMINOPHEN 325 MG PO TABS: 650.0000 mg | ORAL_TABLET | Freq: Four times a day (QID) | ORAL | Status: AC | PRN

## 2022-03-08 MED ORDER — THIAMINE HCL 100 MG PO TABS
100.0000 mg | ORAL_TABLET | Freq: Every day | ORAL | 0 refills | Status: AC
  Filled 2022-03-08: qty 30, 30d supply, fill #0

## 2022-03-08 MED ORDER — MIDODRINE HCL 10 MG PO TABS
10.0000 mg | ORAL_TABLET | Freq: Three times a day (TID) | ORAL | 0 refills | Status: DC
  Filled 2022-03-08: qty 30, 10d supply, fill #0

## 2022-03-08 MED ORDER — ONDANSETRON HCL 4 MG PO TABS
4.0000 mg | ORAL_TABLET | Freq: Four times a day (QID) | ORAL | 0 refills | Status: AC | PRN
  Filled 2022-03-08: qty 20, 5d supply, fill #0

## 2022-03-08 NOTE — Progress Notes (Signed)
PT Cancellation Note  Patient Details Name: Sarah Kelley MRN: [id number] DOB: 1950-08-04   Cancelled Treatment:    Reason Eval/Treat Not Completed: PT screened, no needs identified, will sign off Pt evaluated and discharged on 8/21, screened on 8/23 and 9/03 when additional orders received. Pt total A at baseline and would benefit from hoyer lift as stated in previous notes. No skilled PT needs at this time.   Lou Miner, DPT  Acute Rehabilitation Services  Office: [phone]    Rudean Hitt 03/08/2022, 8:57 AM

## 2022-03-08 NOTE — Progress Notes (Signed)
Speech Language Pathology Treatment: Dysphagia  Patient Details Name: Sarah Kelley MRN: [id number] DOB: 1950-09-05 Today's Date: 03/08/2022 Time: 1130-1150 SLP Time Calculation (min) (ACUTE ONLY): 20 min  Assessment / Plan / Recommendation Clinical Impression  Visited pt and son at bedside. Son reports pts intake is good. She has been drinking Nepro shakes, taking some soup with rice from home. She has not refused food or drink from him and he has been able to help the RN give her pills. SLP reviewed diagnosis of esophageal dysmotility and what strategies are needed with feeding to reduce risk of postprandial aspiration or regurgitation. Son verbalized understanding. No SLP f/u needed   HPI HPI: Pt is a 71 y.o. female who presented secondary to poor p.o. intake. Per H&P, pt "feels like food gets stuck in throat". Suspected to be esophageal and GI consulted, but SLP also consulted for oropharyngeal assessment. BSE (02/19/22) no s/sx of aspiration with solids or liquids. Clear liquids diet initiated per GI recommendation, SLP signed-off at that time. Esophagram (8/23) revealed "Age-related esophageal dysmotility with poor primary contractions an abnormal disordered tertiary contractions with delayed passage of barium. No mass or stricture". LPN note (0/17) states "patient is having difficulty swallowing crushed medication in apple sauce. Patient is screaming not able to be consoled by family". Pt mentation deteriorated, but improved and  BSE re-ordered. PMH: ESRD on TTS dialysis, HTN, HLD, GERD, DM2 on insulin.      SLP Plan         Recommendations for follow up therapy are one component of a multi-disciplinary discharge planning process, led by the attending physician.  Recommendations may be updated based on patient status, additional functional criteria and insurance authorization.    Recommendations  Diet recommendations: Dysphagia 1 (puree);Thin liquid Liquids provided via:  Cup;Straw Medication Administration: Crushed with puree Supervision: Trained caregiver to feed patient Compensations: Slow rate;Small sips/bites;Minimize environmental distractions Postural Changes and/or Swallow Maneuvers: Seated upright 90 degrees                Oral Care Recommendations: Oral care BID Follow Up Recommendations: No SLP follow up Assistance recommended at discharge: Frequent or constant Supervision/Assistance SLP Visit Diagnosis: Dysphagia, unspecified (R13.10)           Sarah Kelley, Sarah Kelley  03/08/2022, 1:05 PM

## 2022-03-08 NOTE — Progress Notes (Signed)
[facility] Progress Note   Subjective:  Seen in room - looks comfortable and baseline mental status. Did reasonably ok with HD yesterday, BP dropped but perked back up pretty quickly with turned UF goal down. Repeat CXR 9/5 showed resolution of pulm edema/effusions. Discussed with son today - seems like family not yet ready to make a decision regarding hospice. Since she is stable at the moment, would recommend discharging her home with family. Will continue to talk to family regularly when she is seen at her dialysis unit. Either she will stabilize or worsen, and if/when she does worsen, I suspect family will be ready to stop dialysis at that time.  Objective Vitals:   03/07/22 1251 03/07/22 1410 03/07/22 2034 03/08/22 0614  BP:   (!) 108/57 (!) 109/31  Pulse:  71 73 71  Resp:   16 19  Temp:  98 F (36.7 C) 98.6 F (37 C) 97.8 F (36.6 C)  TempSrc:  Tympanic Axillary Oral  SpO2:  100% 100% 96%  Weight: 51.4 kg     Height:       Physical Exam General: Chronically frail and confused woman, NAD. Room air. Heart: Bradycardic, no murmur Lungs: CTA anteriorly Abdomen: soft Extremities: No LE edema; bandages to B ankles Dialysis Access: L AVG + bruit  Additional Objective Labs: Basic Metabolic Panel: Recent Labs  Lab 03/06/22 0059 03/07/22 0743 03/08/22 0204  NA 131* 132* 136  K 3.9 4.3 4.4  CL 92* 93* 96*  CO2 27 25 29   GLUCOSE 177* 131* 144*  BUN 14 22 15   CREATININE 5.73* 8.03* 5.29*  CALCIUM 10.2 9.7 9.9  PHOS 5.4* 6.4* 5.1*   Liver Function Tests: Recent Labs  Lab 03/06/22 0059 03/07/22 0743 03/08/22 0204  ALBUMIN 3.0* 2.7* 2.9*   CBC: Recent Labs  Lab 03/02/22 0650 03/05/22 0054 03/06/22 0059 03/07/22 0743 03/08/22 0204  WBC 5.1 5.6 6.1 6.3 4.7  NEUTROABS  --  3.6 3.5  --  2.7  HGB 12.6 12.3 13.0 12.0 12.8  HCT 38.4 36.9 40.0 37.0 39.9  MCV 100.8* 97.9 100.0 100.5* 100.3*  PLT 128* 134* 139* 164 157   CBG: Recent Labs  Lab  03/06/22 1830 03/07/22 2029 03/08/22 0022 03/08/22 0428 03/08/22 0738  GLUCAP 176* 187* 162* 117* 135*   Studies/Results: DG CHEST PORT 1 VIEW  Result Date: 03/07/2022 CLINICAL DATA:  Pleural effusions EXAM: PORTABLE CHEST 1 VIEW COMPARISON:  None Available. FINDINGS: Normal mediastinum and cardiac silhouette. Normal pulmonary vasculature. No evidence of effusion, infiltrate, or pneumothorax. No acute bony abnormality. Vascular stent LEFT axilla IMPRESSION: No acute cardiopulmonary process. Electronically Signed   By: Suzy Bouchard M.D.   On: 03/07/2022 15:38    Medications:   (feeding supplement) PROSource Plus  30 mL Oral BID BM   Chlorhexidine Gluconate Cloth  6 each Topical Q0600   feeding supplement  1 Container Oral TID BM   heparin  5,000 Units Subcutaneous Q8H   midodrine  10 mg Oral TID WC   thiamine  100 mg Oral Daily    Dialysis Orders: [facility] 4h  400/500  52kg  2/2  P2  Heparin 2000 units IV LUA AVG - Hectoral 6mcg IV q HD   Assessment/Plan: 1. Enterocolitis with diarrhea and dysphagia/reduced PO intake: S/p course of IV Ceftriaxone + metronidazole. Negative GI panel.  GI consulted, now signed off. 2. ESRD: Continue HD on TTS schedule for now - HD tomorrow. 3. HTN/volume: BP drops q HD, now on  midodrine 10mg  TIW (although issues tolerating her meds at times). CXR 8/24 with pulm edema/effusions, repeat CXR 9/5 with resolution. 4. Anemia: Hgb 12, no ESA needed. 5. Secondary hyperparathyroidism: Ca high, Phos ok - VDRA on hold. 6. Nutrition: Alb low, continue to encourage diet and supplement use. 7. Severe dementia: Ongoing issue prior to admit, dialysis is not contributing to QOL. Palliative care has been extremely helpful, but seems like family is not yet ready to pursue comfort care measures. She remains DNR. Dialysis did ok yesterday - pleural effusions improved - reasonably stable. See above - would recommend discharging home and can continue [place] discussions as  outpatient.   Veneta Penton, PA-C 03/08/2022, 10:19 AM  [employer]

## 2022-03-08 NOTE — Progress Notes (Signed)
PROGRESS NOTE    Sarah Kelley  GQQ:[id number] DOB: 1950-09-06 DOA: 02/18/2022 PCP: [name], MD   Brief Narrative:  The patient is a 71 y.o. female with medical history significant of ESRD on TTS dialysis, HTN, diabetes, HLD who presented with 2 weeks decreased PO intake, diarrhea, abdominal discomfort, globus sensation.  Imaging at presentation concerning for colitis/enteritis.  She's been treated with IV abx and has received dialysis while she's been here.  GI and SLP eval.   Palliative care was engaged as she was not improving throughout hospital course She became more responsive on 9/1 and was started on a diet on 9/2 Son is primary decision maker and Family is having difficulty accepting EOL status despite multiple meetings with palliative care If patient continues to be stable in the next several days can discharge home with home health as they have support and can further these conversations in the outpatient setting and anticipating discharging home tomorrow after dialysis as TOC is arranging equipment and referring to home health charity  Assessment and Plan: Enterocolitis Presumed infectious initially on admission, with frequent diarrhea  -C. 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 GI consulted, give foods/beverages she likes/tolerates Mentation improved some although still high risk for aspiration-placed on dysphagia 1 on 9/2--tolerating some diet, not eating much simplify as much as possible oral meds and she will need to take them with ice cream/pudding   Elevated troponin Cards indicated no work-up and flat troponins not consistent with ACS-echo grade 1 DD with EF 55% with no wall motion  abnormality   ESRD (end stage renal disease) ([place]) On admit overloaded with mod B pleural effusions on CT scan despite recent GI illness with diarrhea, poor PO intake It has been difficult to manage during dialysis - hypotensive and needs IV albumin and fluids--yesterday she had 1300 cc pulled --became hypotensive -given 100 cc fluids--they did not need to give midodrine at HD  also on midodrine 5 mg 3 times daily Nephrology discussed with the patient that dialysis may not be a good long-term solution Family was able to see how she did on dialysis on 9/4 These discussions can be carried out in the outpatient setting after dialysis tomorrow   Hypothyroidism TSH wnl with elevated free T4, unclear significance    Type 2 diabetes mellitus with chronic kidney disease on chronic dialysis, with long-term current use of insulin ([place]) -Hemoglobin A1c 7.7  (02/18/2022 ). Sugars ranging 117-186 We did discontinue d/10 and observe cbg and she was able to maintain her sugars Likely being discharged in the morning after dialysis   Essential Hypertension fluctuating bP, intermittent hypotension--periodically getting albumin at HD Continue to hold BP medications. continue midodrine 5 3 times daily and continue at discharge and hold her antihypertensive with lisinopril   Pain Unclear cause, see below Delirium? Fentanyl prn ordered, discontinued all opiates  Mentation has cleared some and she is more awake and per son she is closer to her baseline   Bilateral pleural effusion Repeat CXR 8/24 with mild bilateral effusion, increased mild central pulm vascular congestion CT with moderate R and small L effusions on 8/27 -> suspected initially due to volume, though renal doesn't think she's overloaded -She is being dialyzed tomorrow   Hypokalemia - Likely in part secondary to  GI losses, resolved   Goals of care, counseling/discussion Will c/s palliative care to help start [place]  conversations transitioned to DNR/DNI.  Several meetings have been accomplished with Pallaiitve and she remains DNR but not comfort Dr. Verlon Au personally have had several interactions and discussions with patient and family-she has improved some-if she stable tomorrow morning after dialysis she can be discharged strongly recommend palliative care following the patient at home but await further information from palliative care team from    Dementia without behavioral disturbance ([place]) Delirium precautions--has had some improvement  DVT prophylaxis: heparin injection 5,000 Units Start: 02/18/22 2230    Code Status: DNR Family Communication: Son was at bedside  Disposition Plan:  Level of care: Telemetry Medical Status is: Inpatient Remains inpatient appropriate because: Currently it is being delivered tomorrow and will discharge after dialysis tomorrow   Consultants:  -Palliative care -Nephrology  Procedures:  As above  Antimicrobials:  Anti-infectives (From admission, onward)    Start     Dose/Rate Route Frequency Ordered Stop   02/27/22 2200  cefTRIAXone (ROCEPHIN) 2 g in sodium chloride 0.9 % 100 mL IVPB        2 g 200 mL/hr over 30 Minutes Intravenous Every 24 hours 02/27/22 1814 02/28/22 2250   02/18/22 2230  cefTRIAXone (ROCEPHIN) 2 g in sodium chloride 0.9 % 100 mL IVPB  Status:  Discontinued        2 g 200 mL/hr over 30 Minutes Intravenous Every 24 hours 02/18/22 2217 02/27/22 1814   02/18/22 2230  metroNIDAZOLE (FLAGYL) IVPB 500 mg  Status:  Discontinued        500 mg 100 mL/hr over 60 Minutes Intravenous Every 12 hours 02/18/22 2217 02/28/22 1322       Subjective: Seen and examined at bedside and she is doing okay.  Anticipating discharging soon.  Son at bedside and understands the plan.  Does not want to discontinue dialysis.  He states that he lives with his mom and is her caregiver and lives for normally at home from the bed to the wheelchair when she can get  dialysis.  Anticipating discharging tomorrow.  Objective: Vitals:   03/07/22 1410 03/07/22 2034 03/08/22 0614 03/08/22 1356  BP:  (!) 108/57 (!) 109/31 (!) 109/53  Pulse: 71 73 71 86  Resp:  16 19 16   Temp: 98 F (36.7 C) 98.6 F (37 C) 97.8 F (36.6 C) 98.3 F (36.8 C)  TempSrc: Tympanic Axillary Oral   SpO2: 100% 100% 96% 94%  Weight:      Height:        Intake/Output Summary (Last 24 hours) at 03/08/2022 1809 Last data filed at 03/08/2022 0900 Gross per 24 hour  Intake 240 ml  Output --  Net 240 ml   Filed Weights   03/04/22 1300 03/07/22 0748 03/07/22 1251  Weight: 53.1 kg 53.7 kg 51.4 kg   Examination: Physical Exam:  Constitutional: Thin frail cachectic Hispanic female in no acute distress appears little confused but per son she is back to her baseline Respiratory: Diminished to auscultation bilaterally, no wheezing, rales, rhonchi or crackles. Normal respiratory effort and patient is not tachypenic. No accessory muscle use.  Cardiovascular: RRR, no murmurs / rubs / gallops. S1 and S2 auscultated. No extremity edema.  Abdomen: Soft, non-tender, nondistended. Bowel sounds positive.  GU: Deferred. Musculoskeletal: No clubbing / cyanosis of digits/nails. No joint deformity upper and lower extremities. Skin: No rashes, lesions, ulcers on limited skin evaluation. No induration; Warm and dry.  Neurologic: CN 2-12 grossly intact with no focal deficits. Romberg sign and cerebellar reflexes not assessed.  Psychiatric: Impaired judgment and insight   Data Reviewed: I have personally reviewed following labs and imaging studies  CBC: Recent Labs  Lab 03/02/22 0650 03/05/22 0054 03/06/22 0059 03/07/22 0743 03/08/22 0204  WBC 5.1 5.6 6.1 6.3 4.7  NEUTROABS  --  3.6 3.5  --  2.7  HGB 12.6 12.3 13.0 12.0 12.8  HCT 38.4 36.9 40.0 37.0 39.9  MCV 100.8* 97.9 100.0 100.5* 100.3*  PLT 128* 134* 139* 164 794   Basic Metabolic Panel: Recent Labs  Lab 03/02/22 0650  03/05/22 0054 03/06/22 0059 03/07/22 0743 03/08/22 0204  NA 132* 132* 131* 132* 136  K 4.1 3.7 3.9 4.3 4.4  CL 91* 94* 92* 93* 96*  CO2 26 28 27 25 29   GLUCOSE 181* 210* 177* 131* 144*  BUN 14 7* 14 22 15   CREATININE 6.83* 4.19* 5.73* 8.03* 5.29*  CALCIUM 9.7 9.6 10.2 9.7 9.9  PHOS 5.0*  --  5.4* 6.4* 5.1*   GFR: Estimated Creatinine Clearance: 6.8 mL/min (A) (by C-G formula based on SCr of 5.29 mg/dL (H)). Liver Function Tests: Recent Labs  Lab 03/02/22 0650 03/06/22 0059 03/07/22 0743 03/08/22 0204  ALBUMIN 2.7* 3.0* 2.7* 2.9*   No results for input(s): "LIPASE", "AMYLASE" in the last 168 hours. No results for input(s): "AMMONIA" in the last 168 hours. Coagulation Profile: No results for input(s): "INR", "PROTIME" in the last 168 hours. Cardiac Enzymes: No results for input(s): "CKTOTAL", "CKMB", "CKMBINDEX", "TROPONINI" in the last 168 hours. BNP (last 3 results) No results for input(s): "PROBNP" in the last 8760 hours. HbA1C: No results for input(s): "HGBA1C" in the last 72 hours. CBG: Recent Labs  Lab 03/08/22 0022 03/08/22 0428 03/08/22 0738 03/08/22 1140 03/08/22 1538  GLUCAP 162* 117* 135* 184* 186*   Lipid Profile: No results for input(s): "CHOL", "HDL", "LDLCALC", "TRIG", "CHOLHDL", "LDLDIRECT" in the last 72 hours. Thyroid Function Tests: No results for input(s): "TSH", "T4TOTAL", "FREET4", "T3FREE", "THYROIDAB" in the last 72 hours. Anemia Panel: No results for input(s): "VITAMINB12", "FOLATE", "FERRITIN", "TIBC", "IRON", "RETICCTPCT" in the last 72 hours. Sepsis Labs: No results for input(s): "PROCALCITON", "LATICACIDVEN" in the last 168 hours.  No results found for this or any previous visit (from the past 240 hour(s)).   Radiology Studies: DG CHEST PORT 1 VIEW  Result Date: 03/07/2022 CLINICAL DATA:  Pleural effusions EXAM: PORTABLE CHEST 1 VIEW COMPARISON:  None Available. FINDINGS: Normal mediastinum and cardiac silhouette. Normal pulmonary  vasculature. No evidence of effusion, infiltrate, or pneumothorax. No acute bony abnormality. Vascular stent LEFT axilla IMPRESSION: No acute cardiopulmonary process. Electronically Signed   By: Suzy Bouchard M.D.   On: 03/07/2022 15:38     Scheduled Meds:  (feeding supplement) PROSource Plus  30 mL Oral BID BM   Chlorhexidine Gluconate Cloth  6 each Topical Q0600   feeding supplement  1 Container Oral TID BM   heparin  5,000 Units Subcutaneous Q8H   midodrine  10 mg Oral TID WC   thiamine  100 mg Oral Daily   Continuous Infusions:   LOS: 17 days   Raiford Noble, DO [facility] Available via Epic secure chat 7am-7pm After these hours, please refer to coverage provider listed on [email or web address] 03/08/2022, 6:09 PM

## 2022-03-08 NOTE — Progress Notes (Addendum)
Contacted by RN CM regarding possible plans for pt to d/c to home today. Contacted [facility] and spoke to [place], Therapist, sports. Clinic advised of plan for pt to d/c today and resume care tomorrow. Pt will resume regular HD schedule of TTS with 6:30 am chair at [facility]. Appt added to AVS.   Melven Sartorius Renal Navigator [phone]  Addendum at 3:29 pm: Case discussed with renal PA who recommended hoyer lift for pt at d/c so pt could be lifted to w/c and then to HD chair. PA also recommended that pt could sit in chair at pt's bedside to see how pt tolerates prior to d/c as well. This info was provided to to RN CM who also plans to contact nephrologist.    Addendum at 4:10 pm: Case discussed with RN CM after staff met with pt and pt's son. RN CM working to arrange a [employer] fast streamer for pt to use at home at d/c. Plan is for pt to d/c tomorrow. Staff requesting for pt to receive HD in the chair tomorrow to make sure pt can tolerate. Contacted nephrologist and renal PA with this request. They are agreeable and to contact inpt HD unit with this request. Contacted [facility] to advise staff that pt will not be at treatment tomorrow as planned due to the above.

## 2022-03-08 NOTE — Progress Notes (Signed)
OT Cancellation Note  Patient Details Name: Sarah Kelley MRN: [id number] DOB: 1951/06/26   Cancelled Treatment:    Reason Eval/Treat Not Completed: OT screened, no needs identified, will sign off (New OT orders received. OT evaluated and discharged on 8/21, screened on 8/23 and 9/03 when additional orders received. Pt receives extensive assist for ADLs/transfers at baseline and noted with continued medical decline during this admission.) Palliative involved with [place] discussions, meeting today with family. No skilled OT services needed at acute level at this time.  Keshan Reha A Vy Badley 03/08/2022, 8:52 AM

## 2022-03-08 NOTE — TOC Progression Note (Addendum)
Transition of Care [facility]) - Progression Note    Patient Details  Name: Sarah Kelley MRN: [id number] Date of Birth: 24-Dec-1950  Transition of Care [facility]) CM/SW Contact  Sharin Mons, RN Phone Number: 03/08/2022, 2:11 PM  Clinical Narrative:    Admitted with enterocolitis, hx of ESRD /TTS dialysis, HTN, diabetes, HLD. Pt from home with son,Sarah Kelley ( primary caregiver), spanish speaking. Son states  PTA  W/C bound, limited mobility.  Resides in a mobile home.States he provides transportation to and from HD.   Hospital course FTT. Palliative following.Marland KitchenMarland KitchenSon has declined hospice care, wants pt to continue with HD sessions States can manage pt @ home. PTA states he would lift pt and place in W/C.  Pt without insurance. PCP: [place]. Referral made with [facility] for Medicaid Screening [facility] states has spoken with son regarding financial assistance. Pt is not a citizen.   LOG requested for hoyer lift with TOC supervisor.Rental cost $100/month vs purchase cost $995.00 for hoyer lift from [employer], awaiting response.  Referral made with [facility] for charity [facility] and SW, acceptance pending.  TOC teaming following and will assist with needs...  1600 LOG received for hoyer lift from [facility] supervisor for one month with [employer].  Expected Discharge Plan and Services                                                 Social Determinants of Health (SDOH) Interventions    Readmission Risk Interventions     No data to display

## 2022-03-09 ENCOUNTER — Other Ambulatory Visit (HOSPITAL_COMMUNITY): Payer: Self-pay

## 2022-03-09 LAB — CBC WITH DIFFERENTIAL/PLATELET
Abs Immature Granulocytes: 0.02 10*3/uL (ref 0.00–0.07)
Basophils Absolute: 0.1 10*3/uL (ref 0.0–0.1)
Basophils Relative: 2 %
Eosinophils Absolute: 0.2 10*3/uL (ref 0.0–0.5)
Eosinophils Relative: 3 %
HCT: 37.1 % (ref 36.0–46.0)
Hemoglobin: 12.1 g/dL (ref 12.0–15.0)
Immature Granulocytes: 0 %
Lymphocytes Relative: 21 %
Lymphs Abs: 1.3 10*3/uL (ref 0.7–4.0)
MCH: 32.3 pg (ref 26.0–34.0)
MCHC: 32.6 g/dL (ref 30.0–36.0)
MCV: 98.9 fL (ref 80.0–100.0)
Monocytes Absolute: 0.9 10*3/uL (ref 0.1–1.0)
Monocytes Relative: 14 %
Neutro Abs: 3.9 10*3/uL (ref 1.7–7.7)
Neutrophils Relative %: 60 %
Platelets: 159 10*3/uL (ref 150–400)
RBC: 3.75 MIL/uL — ABNORMAL LOW (ref 3.87–5.11)
RDW: 15.9 % — ABNORMAL HIGH (ref 11.5–15.5)
WBC: 6.4 10*3/uL (ref 4.0–10.5)
nRBC: 0 % (ref 0.0–0.2)

## 2022-03-09 LAB — COMPREHENSIVE METABOLIC PANEL
ALT: 16 U/L (ref 0–44)
AST: 28 U/L (ref 15–41)
Albumin: 2.8 g/dL — ABNORMAL LOW (ref 3.5–5.0)
Alkaline Phosphatase: 132 U/L — ABNORMAL HIGH (ref 38–126)
Anion gap: 14 (ref 5–15)
BUN: 26 mg/dL — ABNORMAL HIGH (ref 8–23)
CO2: 27 mmol/L (ref 22–32)
Calcium: 10.2 mg/dL (ref 8.9–10.3)
Chloride: 92 mmol/L — ABNORMAL LOW (ref 98–111)
Creatinine, Ser: 7.01 mg/dL — ABNORMAL HIGH (ref 0.44–1.00)
GFR, Estimated: 6 mL/min — ABNORMAL LOW (ref >60)
Glucose, Bld: 237 mg/dL — ABNORMAL HIGH (ref 70–99)
Potassium: 4.2 mmol/L (ref 3.5–5.1)
Sodium: 133 mmol/L — ABNORMAL LOW (ref 135–145)
Total Bilirubin: 0.6 mg/dL (ref 0.3–1.2)
Total Protein: 6.8 g/dL (ref 6.5–8.1)

## 2022-03-09 LAB — GLUCOSE, CAPILLARY
Glucose-Capillary: 159 mg/dL — ABNORMAL HIGH (ref 70–99)
Glucose-Capillary: 218 mg/dL — ABNORMAL HIGH (ref 70–99)

## 2022-03-09 LAB — MAGNESIUM: Magnesium: 1.9 mg/dL (ref 1.7–2.4)

## 2022-03-09 LAB — PHOSPHORUS: Phosphorus: 6.2 mg/dL — ABNORMAL HIGH (ref 2.5–4.6)

## 2022-03-09 MED ORDER — PANTOPRAZOLE SODIUM 40 MG PO TBEC
40.0000 mg | DELAYED_RELEASE_TABLET | Freq: Every day | ORAL | 0 refills | Status: DC
  Filled 2022-03-09: qty 30, 30d supply, fill #0

## 2022-03-09 MED ORDER — PANTOPRAZOLE SODIUM 40 MG PO TBEC: 40.0000 mg | DELAYED_RELEASE_TABLET | Freq: Every day | ORAL | Status: DC

## 2022-03-09 MED ORDER — PROSOURCE PLUS PO LIQD
30.0000 mL | Freq: Two times a day (BID) | ORAL | 0 refills | Status: AC
  Filled 2022-03-09: qty 887, 15d supply, fill #0

## 2022-03-09 NOTE — Progress Notes (Signed)
[facility]) Hospital liaison note  Notified by [facility] Whitman Hero, CM/SW of patient/family request for [facility] Palliative services at home after discharge.  [facility] hospital liaison will follow patient for discharge disposition.  Please call with any hospice or outpatient palliative care related questions.   Thank you for the opportunity to participate in this patient's care.  [place]  [facility] liaison  [phone]

## 2022-03-09 NOTE — Progress Notes (Signed)
Patient ID: Netra , female   DOB: 10/12/1950, 71 y.o.   MRN: [id number]    Progress Note from the Palliative Medicine Team at [facility]   Patient Name: Sarah Kelley         Date: 03/09/2022 DOB: 01/25/1951  Age: 71 y.o. MRN#: [id number] Attending Physician: Sheikh, Omair Latif, DO Primary Care Physician: Newlin, [name], MD Admit Date: 02/18/2022   Medical records reviewed   70 y.o. 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 Pt presents to ED with 2 week h/o reduced PO intake, diaphoresis over past 3-4 days and profuse diarrhea - GI consulted stool studies (-) though (+) colitis on BS antibiotics. (+) Dysphagia. Palliative care has been asked to get involved to further address goals of care in the setting of overalll decline and failure to thrive.     Patient is weak, maintains eye contact, continues with poor po intake, unable to follow commands of interpretor,  patient continues with overall failure to thrive.   This NP assessed patient at the bedside as a follow up for palliative medicine needs and emotional support.       I met with patient's son/ Marcial at bedside, along with [name] spanish interpretor as scheduled for conversation regarding plan of care, a friend of Marcial's is also at bedside.  Again education offered today on patient's current medical situation; specific to end-stage renal disease dialysis dependent and end-stage dementia, and overall failure to thrive.    Discussion around the plan for patient to discharge home in the morning after dialysis.   Marciel says "I can do it, I have taken care of her for five years"  Education offered on various in-home services specific to home health, Angela with TOC has explained possibility of Hoyer life, but unfortunately home care services are limited.  Son verbalizes  understanding   Emotional support offered.  Again patient's son states he understands the seriousness of his mother's current medical situation he tells me he "will never say stop dialysis".  He expresses that this is "for God to decide".    Patient's son today is asking for a "full body scan" to see what is going on. I worry about  Marcial's insight in overall full picture.  He says his sister may have a "doctor from [place] coming to look at her"  Education and emotional support offered.  Patient is high risk for decompensation.   Questions and concerns addressed   Discussed with Dr Sheik and TOC team and Dr Shertz     NP  Palliative Medicine Team Team Phone # [phone] Pager [phone]   

## 2022-03-09 NOTE — Progress Notes (Signed)
[facility] Progress Note   Subjective:  Seen on HD - pleasantly confused, her baseline. Tolerating fine. Spoke to outpatient HD SW to confirm a few items of concern, as below: - Son drives her to/from dialysis. He physically lifts her from her home bed to wheelchair to car to wheelchair to HD recliner, has been doing this for years without issue. They have never used a hoyer lift or pad, nor have they needed in past. - Her HD is paid for by emergency medicaid - dialysis SW completes/renews her application every few months.  Objective Vitals:   03/08/22 2150 03/09/22 0630 03/09/22 0949 03/09/22 1015  BP: (!) 118/47 (!) 119/36 (!) 136/46 (!) 120/50  Pulse: (!) 53 (!) 52 (!) 50   Resp: 17 16 11    Temp: 97.8 F (36.6 C) 97.9 F (36.6 C) 98.4 F (36.9 C)   TempSrc: Oral Oral    SpO2: 95% 96% 97% 99%  Weight:   50.3 kg   Height:       Physical Exam General: Chronically frail and confused woman, NAD. Room air. Heart: Bradycardic, no murmur Lungs: CTA anteriorly Abdomen: soft Extremities: No LE edema; bandages to B ankles Dialysis Access: L AVG + bruit   Additional Objective Labs: Basic Metabolic Panel: Recent Labs  Lab 03/07/22 0743 03/08/22 0204 03/09/22 0117  NA 132* 136 133*  K 4.3 4.4 4.2  CL 93* 96* 92*  CO2 25 29 27   GLUCOSE 131* 144* 237*  BUN 22 15 26*  CREATININE 8.03* 5.29* 7.01*  CALCIUM 9.7 9.9 10.2  PHOS 6.4* 5.1* 6.2*   Liver Function Tests: Recent Labs  Lab 03/07/22 0743 03/08/22 0204 03/09/22 0117  AST  --   --  28  ALT  --   --  16  ALKPHOS  --   --  132*  BILITOT  --   --  0.6  PROT  --   --  6.8  ALBUMIN 2.7* 2.9* 2.8*   CBC: Recent Labs  Lab 03/05/22 0054 03/06/22 0059 03/07/22 0743 03/08/22 0204 03/09/22 0117  WBC 5.6 6.1 6.3 4.7 6.4  NEUTROABS 3.6 3.5  --  2.7 3.9  HGB 12.3 13.0 12.0 12.8 12.1  HCT 36.9 40.0 37.0 39.9 37.1  MCV 97.9 100.0 100.5* 100.3* 98.9  PLT 134* 139* 164 157 159   Studies/Results: DG  CHEST PORT 1 VIEW  Result Date: 03/07/2022 CLINICAL DATA:  Pleural effusions EXAM: PORTABLE CHEST 1 VIEW COMPARISON:  None Available. FINDINGS: Normal mediastinum and cardiac silhouette. Normal pulmonary vasculature. No evidence of effusion, infiltrate, or pneumothorax. No acute bony abnormality. Vascular stent LEFT axilla IMPRESSION: No acute cardiopulmonary process. Electronically Signed   By: Suzy Bouchard M.D.   On: 03/07/2022 15:38    Medications:   (feeding supplement) PROSource Plus  30 mL Oral BID BM   Chlorhexidine Gluconate Cloth  6 each Topical Q0600   feeding supplement  1 Container Oral TID BM   heparin  5,000 Units Subcutaneous Q8H   midodrine  10 mg Oral TID WC   pantoprazole  40 mg Oral Daily   thiamine  100 mg Oral Daily    Dialysis Orders: [facility] 4h  400/500  52kg  2/2  P2  Heparin 2000 units IV LUA AVG - Hectoral 82mcg IV q HD   Assessment/Plan: 1. Enterocolitis with diarrhea and dysphagia/reduced PO intake: S/p course of IV Ceftriaxone + metronidazole. Negative GI panel.  GI consulted, now signed off. 2. ESRD: Continue HD on  TTS schedule for now - HD now, tolerating fine. 3. HTN/volume: BP drops q HD, now on midodrine 10mg  TID (although issues tolerating her meds at times). CXR 8/24 with pulm edema/effusions, repeat CXR 9/5 with resolution. 4. Anemia: Hgb 12, no ESA needed. 5. Secondary hyperparathyroidism: Ca high, Phos ok - VDRA on hold. 6. Nutrition: Alb low, continue to encourage diet and supplement use. 7. Severe dementia: Ongoing issue prior to admit. Palliative care has been extremely helpful, but seems like family is not yet ready to pursue comfort care measures. She remains DNR. Dialysis did ok 9/5 and today, pleural effusions improved - reasonably stable and back to baseline MS. Sounds like hoyer pad/lift has been offered but not needed. Would recommend discharging her home with her family and will continue [place] discussions as outpatient.   Veneta Penton,  PA-C 03/09/2022, 11:39 AM  [employer]

## 2022-03-09 NOTE — Progress Notes (Signed)
Received patient in bed to unit.  Alert and oriented.  Informed consent signed and in chart.   Treatment initiated:0930  Treatment completed: 1330  Patient tolerated well.  Transported back to the room  Alert, without acute distress.  Hand-off given to patient's nurse.   Access used: L av graft Access issues: none  Total UF removed: 0 Medication(s) given: none Post HD VS: 469/50,72,25, Post HD weight: 50.3kg   Donah Driver [facility]

## 2022-03-09 NOTE — Progress Notes (Signed)
Pt's case discussed with renal PA and RN CM this am. Pt will d/c to home after HD. Staff was going to attempt HD in chair prior to d/c but chair was not available per staff. Renal PA and nephrologist feel pt should have no issue with out-pt HD in a chair at this time. Contacted [place] to advise clinic of pt's d/c today and that pt should resume care on Saturday.   Melven Sartorius Renal Navigator [phone]

## 2022-03-09 NOTE — TOC Transition Note (Addendum)
Transition of Care [facility]) - CM/SW Discharge Note   Patient Details  Name: Sarah Kelley MRN: [id number] Date of Birth: [birth year]  [facility] CM/SW Contact:  Sharin Mons, RN Phone Number: 03/09/2022, 3:43 PM   Clinical Narrative:    Patient will DC to: Home Anticipated DC date: 03/09/2022 Family notified: son Transport by: car   Per MD patient ready for DC today. RN,  patient's son and Anderson Malta with [facility] aware of DC. [facility], RN and SW.  LOG received from [place] for hoyer pad $308.45 for 3 months with [employer] . Son declined DME hospital bed and hoyer lift. Son to provide transportation to home. [facility] delivered Rx meds to bedside. Post hospital f/u noted on AVS.  RNCM will sign off for now as intervention is no longer needed. Please consult [place] again if new needs arise.   Chriss Driver Son [phone]  Final next level of care: Home w Home Health Services Barriers to Discharge: No Barriers Identified   Patient Goals and CMS Choice        Discharge Placement                       Discharge Plan and Services                DME Arranged: Other see comment (hoyer pad; LOG granted) DME Agency: [employer] Date DME Agency Contacted: 03/09/22 Time DME Agency Contacted: 1840   [place] Arranged: RN, Social Work [employer] Agency: [facility] social research officer ([place]) Date [place]: 03/08/22 Time [place]: 1300 Representative spoke with at [place]: [place] Determinants of Health ([place]) Interventions     Readmission Risk Interventions     No data to display

## 2022-03-13 ENCOUNTER — Telehealth: Payer: Self-pay

## 2022-03-13 NOTE — Telephone Encounter (Signed)
I would recommend stopping the insulin per discharge instructions and if her blood sugars 2 hours after meals are running above 250, they can notify the clinic and I will provide additional recommendation.

## 2022-03-13 NOTE — Telephone Encounter (Signed)
From the discharge call:  Call started with Spanish interpreter [id number]/[employer] and that call was disconnected. I called back with interpreter [phone] /[employer] and call was completed with  patient's son, [name].  He said she is " not well, not bad."   he understands she is to stop taking insulin a per her AVS. He is concerned that if her blood sugar is high, should he give her insulin?  She is eating but not very much. I told him to call this office if he has concerns with her blood sugars and I also told him I would share this concern with Dr Margarita Rana.   he said he has all of her medications, we reviewed the use of the new medications and he did not have any further questions about the med regime.   dependent on family for ADLs. Her son has been lifting her for transfers. She has a BSC and wheelchair and is in need of a new wheelchair. He understands that the wheelchair would be an out of pocket expense as she is uninsured. He explained that he refused the hospital bed and hoyer lift because he was not sure if they would fit in his mobile home. I told him that the home health RN should be able to help him determine what DME he needs and what would fit in his home.   Scheduled to see Dr Margarita Rana-  04/04/2022.

## 2022-03-13 NOTE — Telephone Encounter (Signed)
Transition Care Management Follow-up Telephone Call  Call started with Spanish interpreter [id number]/[employer] and that call was disconnected. I called back with interpreter [phone] /[employer] and call was completed with  patient's son, [name]. Date of discharge and from where: 03/09/2022, [facility] How have you been since you were released from the hospital? He said she is " not well, not bad."  Any questions or concerns? Yes- he understands she is to stop taking insulin a per her AVS. He is concerned that if her blood sugar is high, should he give her insulin  She is eating but not very much. I told him to call this office if he has concerns with her blood sugars and I also told him I would share this concern with Dr Margarita Rana.   Items Reviewed: Did the pt receive and understand the discharge instructions provided? Yes  Medications obtained and verified? Yes - he said he has all of her medications, we reviewed the use of the new medications and he did not have any further questions about the med regime. He has a working glucometer for her.  Other? No  Any new allergies since your discharge? No  Dietary orders reviewed? No Do you have support at home? Yes - son is primary caregiver. He said his sister comes to assist with bathing.  Home Care and Equipment/Supplies: Were home health services ordered? yes If so, what is the name of the agency? [facility]  Has the agency set up a time to come to the patient's home? no Were any new equipment or medical supplies ordered?  Yes: hospital bed and hoyer lift but son refused  What is the name of the medical supply agency? N/a Were you able to get the supplies/equipment? not applicable Do you have any questions related to the use of the equipment or supplies? No  Functional Questionnaire: (I = Independent and D = Dependent) ADLs: dependent on family. Her son has been lifting her for transfers. She has a BSC and wheelchair and is  in need of a new wheelchair. He understands that the wheelchair would be an out of pocket expense as she is uninsured.  He explained that he refused the hospital bed and hoyer lift because he was not sure if they would fit in his mobile home. I told him that the home health RN should be able to help him determine what DME he needs and what would fit in his home.    Follow up appointments reviewed:  PCP Hospital f/u appt confirmed? Yes  Scheduled to see Dr Margarita Rana-  04/04/2022.  [facility] f/u appt confirmed?  None scheduled at this time    Are transportation arrangements needed? No - son drives her.  If their condition worsens, is the pt aware to call PCP or go to the Emergency Dept.? Yes Was the patient provided with contact information for the PCP's office or ED? Yes Was to pt encouraged to call back with questions or concerns? Yes

## 2022-03-14 NOTE — Telephone Encounter (Signed)
Call placed to patient's son, [name], with assistance of Spanish interpreter , [place] # [id number]/[employer] and informed him of Dr [employer] recommendation regarding his mother's insulin:  I would recommend stopping the insulin per discharge instructions and if her blood sugars 2 hours after meals are running above 250, they can notify the clinic and I will provide additional recommendation.   Marcial correctly repeated back the instructions. I provided him with the clinic phone number and encouraged him to call back with any questions.  He also correctly repeated back the phone number.

## 2022-03-15 ENCOUNTER — Telehealth: Payer: Self-pay | Admitting: Family Medicine

## 2022-03-15 NOTE — Telephone Encounter (Signed)
Denise from [place] states pt just released from hospital and palliative care recommend will dr agree  call-back required [phone] opt 2

## 2022-03-16 NOTE — Telephone Encounter (Signed)
Routing to PCP for review.

## 2022-03-16 NOTE — Telephone Encounter (Signed)
If the family and the patient desires testing I will support them but if they do not, then there will be no indication.

## 2022-03-17 ENCOUNTER — Other Ambulatory Visit: Payer: Self-pay

## 2022-03-17 ENCOUNTER — Emergency Department (HOSPITAL_COMMUNITY): Payer: Self-pay

## 2022-03-17 ENCOUNTER — Emergency Department (HOSPITAL_COMMUNITY)
Admission: EM | Admit: 2022-03-17 | Discharge: 2022-03-18 | Discharge status: Against Medical Advice | Payer: Self-pay | Attending: Emergency Medicine | Admitting: Emergency Medicine

## 2022-03-17 ENCOUNTER — Ambulatory Visit: Admission: EM | Admit: 2022-03-17 | Discharge: 2022-03-17 | Payer: MEDICAID

## 2022-03-17 ENCOUNTER — Encounter (HOSPITAL_COMMUNITY): Payer: Self-pay | Admitting: Emergency Medicine

## 2022-03-17 DIAGNOSIS — I959 Hypotension, unspecified: Secondary | ICD-10-CM

## 2022-03-17 DIAGNOSIS — Z5321 Procedure and treatment not carried out due to patient leaving prior to being seen by health care provider: Secondary | ICD-10-CM | POA: Insufficient documentation

## 2022-03-17 DIAGNOSIS — R54 Age-related physical debility: Secondary | ICD-10-CM

## 2022-03-17 DIAGNOSIS — L299 Pruritus, unspecified: Secondary | ICD-10-CM

## 2022-03-17 LAB — COMPREHENSIVE METABOLIC PANEL
ALT: 27 U/L (ref 0–44)
AST: 34 U/L (ref 15–41)
Albumin: 3.3 g/dL — ABNORMAL LOW (ref 3.5–5.0)
Alkaline Phosphatase: 248 U/L — ABNORMAL HIGH (ref 38–126)
Anion gap: 13 (ref 5–15)
BUN: 18 mg/dL (ref 8–23)
CO2: 30 mmol/L (ref 22–32)
Calcium: 11.3 mg/dL — ABNORMAL HIGH (ref 8.9–10.3)
Chloride: 95 mmol/L — ABNORMAL LOW (ref 98–111)
Creatinine, Ser: 4.92 mg/dL — ABNORMAL HIGH (ref 0.44–1.00)
GFR, Estimated: 9 mL/min — ABNORMAL LOW (ref >60)
Glucose, Bld: 295 mg/dL — ABNORMAL HIGH (ref 70–99)
Potassium: 3.6 mmol/L (ref 3.5–5.1)
Sodium: 138 mmol/L (ref 135–145)
Total Bilirubin: 1.3 mg/dL — ABNORMAL HIGH (ref 0.3–1.2)
Total Protein: 7.4 g/dL (ref 6.5–8.1)

## 2022-03-17 LAB — CBC WITH DIFFERENTIAL/PLATELET
Abs Immature Granulocytes: 0.04 10*3/uL (ref 0.00–0.07)
Basophils Absolute: 0.1 10*3/uL (ref 0.0–0.1)
Basophils Relative: 1 %
Eosinophils Absolute: 0.1 10*3/uL (ref 0.0–0.5)
Eosinophils Relative: 2 %
HCT: 39.4 % (ref 36.0–46.0)
Hemoglobin: 12.5 g/dL (ref 12.0–15.0)
Immature Granulocytes: 1 %
Lymphocytes Relative: 18 %
Lymphs Abs: 0.9 10*3/uL (ref 0.7–4.0)
MCH: 32.6 pg (ref 26.0–34.0)
MCHC: 31.7 g/dL (ref 30.0–36.0)
MCV: 102.9 fL — ABNORMAL HIGH (ref 80.0–100.0)
Monocytes Absolute: 0.6 10*3/uL (ref 0.1–1.0)
Monocytes Relative: 11 %
Neutro Abs: 3.3 10*3/uL (ref 1.7–7.7)
Neutrophils Relative %: 67 %
Platelets: 181 10*3/uL (ref 150–400)
RBC: 3.83 MIL/uL — ABNORMAL LOW (ref 3.87–5.11)
RDW: 16 % — ABNORMAL HIGH (ref 11.5–15.5)
WBC: 5 10*3/uL (ref 4.0–10.5)
nRBC: 0 % (ref 0.0–0.2)

## 2022-03-17 LAB — CK: Total CK: 57 U/L (ref 38–234)

## 2022-03-17 NOTE — ED Triage Notes (Signed)
According to caregiver pt has had itching all over body since yesterday from unknown source.

## 2022-03-17 NOTE — Telephone Encounter (Signed)
My apologies, dictation error by Dragon.  If they desire palliative care.

## 2022-03-17 NOTE — ED Provider Notes (Signed)
[facility]    CSN: [id number] Arrival date & time: 03/17/22  1513      History   Chief Complaint Chief Complaint  Patient presents with   Pruritis    HPI Chanele Douglas is a 71 y.o. female.   Parent is here today for evaluation of itching all over body that started yesterday. She has not had rash just itching. She was discharged from hospital a little over a week ago after 3 week stay for failure to thrive. Her son provides her history today as she is seemingly weak- was unable to sign consent forms. Son reports she has been eating a little better since discharge and has not had other complaints.   The history is provided by a relative. The history is limited by a language barrier. Language interpreter used: [employer]- [place].    Past Medical History:  Diagnosis Date   Dementia without behavioral disturbance ([place]) 02/20/2022   Diabetes mellitus    ESRD (end stage renal disease) ([place])    dialysis T/Th/Sa   GERD (gastroesophageal reflux disease)    Hyperlipidemia    Hypertension    Iron deficiency anemia    MRSA infection    hx of   Neuropathy    Diabetic   Pneumonia 07/2016   Seizure-like activity ([place])    Sepsis ([place])    Thyroid disease    Wears dentures     Patient Active Problem List   Diagnosis Date Noted   Pain 02/24/2022   Bilateral pleural effusion 02/22/2022   Malnutrition of moderate degree 02/21/2022   Dementia without behavioral disturbance ([place]) 02/20/2022   Hypokalemia 02/20/2022   Enterocolitis 02/18/2022   Hypothyroidism 02/18/2022   Elevated troponin 02/18/2022   Uremia 07/14/2020   Hyperkalemia 07/14/2020   Hypoglycemia    Elevated liver enzymes 06/14/2020   Other constipation 06/13/2020   AVF (arteriovenous fistula) (H[facility])    MSSA (methicillin susceptible Staphylococcus aureus) infection    Nausea & vomiting 02/11/2018   Insomnia 01/18/2017   Altered mental status    Goals of care, counseling/discussion     Dysphagia 12/04/2016   UTI (urinary tract infection) 12/04/2016   Severe sepsis ([place]) 12/02/2016   Hyperglycemia 12/02/2016   Acute encephalopathy 07/28/2016   CAP (community acquired pneumonia) 07/28/2016   Pneumonia 07/28/2016   ESRD (end stage renal disease) ([place])    Phantom pain ([place]) 09/13/2015   Hyperlipidemia 06/01/2015   Toe amputation status    Insulin-requiring or dependent type II diabetes mellitus ([place])    Chronic kidney disease (CKD) stage G4/A1, severely decreased glomerular filtration rate (GFR) between 15-29 mL/min/1.73 square meter and albuminuria creatinine ratio less than 30 mg/g ([place]) 05/18/2015   Type 2 diabetes mellitus with chronic kidney disease on chronic dialysis, with long-term current use of insulin ([place]) 05/18/2015   Osteomyelitis ([place]) 05/18/2015   Generalized abdominal pain 05/18/2015   Osteomyelitis of left foot ([place]) 05/18/2015   Diabetic neuropathy ([place]) 01/14/2007   Diabetes mellitus without complication ([place]) 26/94/8546   HLD (hyperlipidemia) 01/14/2007   Normocytic anemia 01/14/2007   ANXIETY 01/14/2007   Depression 01/14/2007   Essential hypertension 01/14/2007    Past Surgical History:  Procedure Laterality Date   AMPUTATION Left 05/21/2015   Procedure: Left Foot 3rd Ray Amputation;  Surgeon: Newt Minion, MD;  Location: [facility];  Service: Orthopedics;  Laterality: Left;   AV FISTULA PLACEMENT Left 03/28/2016   Procedure: ARTERIOVENOUS (AV) FISTULA CREATION LEFT ARM;  Surgeon: Waynetta Sandy, MD;  [facility];  Service: Vascular;  Laterality: Left;   AV FISTULA PLACEMENT Right 06/04/2019   Procedure: ARTERIOVENOUS (AV) FISTULA CREATION RIGHT ARM;  Surgeon: Waynetta Sandy, MD;  Location: [facility];  Service: Vascular;  Laterality: Right;   AV FISTULA PLACEMENT Left 03/11/2021   Procedure: INSERTION OF LEFT ARM ARTERIOVENOUS (AV) GORE-TEX GRAFT;  Surgeon: Waynetta Sandy, MD;  Location: [facility];  Service: Vascular;   Laterality: Left;   DILATION AND CURETTAGE OF UTERUS     EYE SURGERY     FISTULA SUPERFICIALIZATION Left 09/04/2016   Procedure: FISTULA SUPERFICIALIZATION LEFT UPPER ARM;  Surgeon: Waynetta Sandy, MD;  Location: [facility];  Service: Vascular;  Laterality: Left;   FISTULA SUPERFICIALIZATION Right 10/08/2019   Procedure: FISTULA SUPERFICIALIZATION OR RIGHT ARM;  Surgeon: Waynetta Sandy, MD;  Location: [facility];  Service: Vascular;  Laterality: Right;   IR AV DIALY SHUNT INTRO NEEDLE/INTRAC INITIAL W/PTA/STENT/IMG LT Left 06/13/2017   IR AV DIALY SHUNT INTRO NEEDLE/INTRAC INITIAL W/PTA/STENT/IMG LT Left 09/12/2017   IR AV DIALY SHUNT INTRO NEEDLE/INTRACATH INITIAL W/PTA/IMG LEFT  11/10/2016   IR AV DIALY SHUNT INTRO NEEDLE/INTRACATH INITIAL W/PTA/IMG LEFT  03/09/2017   IR AV DIALY SHUNT INTRO NEEDLE/INTRACATH INITIAL W/PTA/IMG LEFT  12/12/2017   IR AV DIALY SHUNT INTRO NEEDLE/INTRACATH INITIAL W/PTA/IMG LEFT  02/27/2018   IR AV DIALY SHUNT INTRO NEEDLE/INTRACATH INITIAL W/PTA/IMG LEFT  06/05/2018   IR AV DIALY SHUNT INTRO NEEDLE/INTRACATH INITIAL W/PTA/IMG LEFT  09/11/2018   IR AV DIALY SHUNT INTRO NEEDLE/INTRACATH INITIAL W/PTA/IMG LEFT  12/26/2021   IR AV DIALY SHUNT INTRO NEEDLE/INTRACATH INITIAL W/PTA/IMG RIGHT Right 04/21/2020   IR AV DIALY SHUNT INTRO NEEDLE/INTRACATH INITIAL W/PTA/IMG RIGHT Right 10/08/2020   IR DIALY SHUNT INTRO NEEDLE/INTRACATH INITIAL W/IMG LEFT Left 05/08/2018   IR DIALY SHUNT INTRO NEEDLE/INTRACATH INITIAL W/IMG LEFT Left 06/17/2021   IR FLUORO GUIDE CV LINE RIGHT  02/01/2019   IR FLUORO GUIDE CV LINE RIGHT  07/14/2020   IR FLUORO GUIDE CV LINE RIGHT  12/29/2020   IR GENERIC HISTORICAL  08/01/2016   IR FLUORO GUIDE CV LINE RIGHT 08/01/2016 [name], MD [facility]   IR GENERIC HISTORICAL  08/01/2016   IR US GUIDE VASC ACCESS RIGHT 08/01/2016 [name], MD [facility]   IR REMOVAL TUN CV CATH W/O FL  11/01/2016   IR REMOVAL TUN CV CATH W/O FL  01/02/2020   IR REMOVAL  TUN CV CATH W/O FL  05/04/2021   IR THROMBECTOMY AV FISTULA W/THROMBOLYSIS INC/SHUNT/IMG LEFT Left 01/31/2019   IR THROMBECTOMY AV FISTULA W/THROMBOLYSIS/PTA INC/SHUNT/IMG LEFT Left 01/27/2019   IR THROMBECTOMY AV FISTULA W/THROMBOLYSIS/PTA INC/SHUNT/IMG LEFT Left 07/16/2020   IR THROMBECTOMY AV FISTULA W/THROMBOLYSIS/PTA/STENT INC/SHUNT/IMG RT Right 09/24/2020   IR US GUIDE VASC ACCESS LEFT  11/10/2016   IR US GUIDE VASC ACCESS LEFT  09/12/2017   IR US GUIDE VASC ACCESS LEFT  12/12/2017   IR US GUIDE VASC ACCESS LEFT  06/05/2018   IR US GUIDE VASC ACCESS LEFT  09/11/2018   IR US GUIDE VASC ACCESS LEFT  01/27/2019   IR US GUIDE VASC ACCESS LEFT  02/01/2019   IR US GUIDE VASC ACCESS LEFT  12/26/2021   IR US GUIDE VASC ACCESS RIGHT  02/01/2019   IR US GUIDE VASC ACCESS RIGHT  07/14/2020   IR US GUIDE VASC ACCESS RIGHT  07/16/2020   IR US GUIDE VASC ACCESS RIGHT  10/08/2020   IR US GUIDE VASC ACCESS RIGHT  12/29/2020   PERIPHERAL  VASCULAR BALLOON ANGIOPLASTY Left 07/22/2021   Procedure: PERIPHERAL VASCULAR BALLOON ANGIOPLASTY;  Surgeon: Cherre Robins, MD;  Location: [facility];  Service: Cardiovascular;  Laterality: Left;   UPPER EXTREMITY VENOGRAPHY Bilateral 02/28/2021   Procedure: UPPER EXTREMITY VENOGRAPHY;  Surgeon: Waynetta Sandy, MD;  Location: [facility];  Service: Cardiovascular;  Laterality: Bilateral;    OB History   No obstetric history on file.      Home Medications    Prior to Admission medications   Medication Sig Start Date End Date Taking? Authorizing Provider  acetaminophen (TYLENOL) 325 MG tablet Take 2 tablets (650 mg total) by mouth every 6 (six) hours as needed for mild pain (or Fever >/= 101). 03/08/22   Sheikh, Omair Latif, DO  atorvastatin (LIPITOR) 40 MG tablet TAKE 1 TABLET (40 MG TOTAL) BY MOUTH DAILY. (office visit for refills) Patient taking differently: Take 40 mg by mouth daily. 11/16/21 11/16/22  [name], MD  midodrine (PROAMATINE) 10 MG  tablet Take 1 tablet (10 mg total) by mouth 3 (three) times daily with meals. 03/08/22   Raiford Noble Latif, DO  Nutritional Supplements (,FEEDING SUPPLEMENT, PROSOURCE PLUS) liquid Take 30 mLs by mouth 2 (two) times daily between meals. 03/09/22   Raiford Noble Latif, DO  Nutritional Supplements (FEEDING SUPPLEMENT, NEPRO CARB STEADY,) LIQD Take 237 mLs by mouth See admin instructions. Take 1 can (237 ml) by mouth once daily on Tuesdays, Thursdays & Saturday morning. May take an additional can (237 ml) by mouth if needed for poor appetite.    [provider]  ondansetron (ZOFRAN) 4 MG tablet Take 1 tablet (4 mg total) by mouth every 6 (six) hours as needed for nausea. 03/08/22   Raiford Noble Latif, DO  pantoprazole (PROTONIX) 40 MG tablet Take 1 tablet (40 mg total) by mouth daily. 03/09/22   Raiford Noble Latif, DO  thiamine (VITAMIN B1) 100 MG tablet Take 1 tablet (100 mg total) by mouth daily. 03/09/22   Kerney Elbe, DO    Family History History reviewed. No pertinent family history.  Social History Social History   Tobacco Use   Smoking status: Never   Smokeless tobacco: Never  Vaping Use   Vaping Use: Never used  Substance Use Topics   Alcohol use: Not Currently    Alcohol/week: 0.0 standard drinks of alcohol    Comment: rare- a beer every now and then   Drug use: No     Allergies   Patient has no known allergies.   Review of Systems Review of Systems  Constitutional:  Negative for chills and fever.  Eyes:  Negative for discharge and redness.  Respiratory:  Negative for wheezing.   Skin:  Negative for rash.  Neurological:  Positive for weakness (generalized).     Physical Exam Triage Vital Signs ED Triage Vitals  Enc Vitals Group     BP 03/17/22 1616 (!) 102/54     Pulse Rate 03/17/22 1612 (!) 136     Resp 03/17/22 1612 16     Temp 03/17/22 1612 98.3 F (36.8 C)     Temp Source 03/17/22 1612 Oral     SpO2 03/17/22 1612 92 %     Weight --      Height  --      Head Circumference --      Peak Flow --      Pain Score 03/17/22 1615 0     Pain Loc --  Pain Edu? --      Excl. in [place]? --    No data found.  Updated Vital Signs BP (!) 102/54   Pulse 83   Temp 98.3 F (36.8 C) (Oral)   Resp 16   LMP  (LMP Unknown)   SpO2 98%      Physical Exam Vitals and nursing note reviewed.  Constitutional:      Comments: Frail patient- sitting in wheelchair- scratching throughout history and exam- seemingly uncontrollable itching  HENT:     Head: Normocephalic and atraumatic.     Mouth/Throat:     Comments: Poor dentition Cardiovascular:     Rate and Rhythm: Normal rate.  Pulmonary:     Effort: Pulmonary effort is normal. No respiratory distress.     Breath sounds: Normal breath sounds. No wheezing, rhonchi or rales.  Neurological:     Comments: Mostly nonverbal with exception of comment of "help me please"       UC Treatments / Results  Labs (all labs ordered are listed, but only abnormal results are displayed) Labs Reviewed - No data to display  EKG   Radiology No results found.  Procedures Procedures (including critical care time)  Medications Ordered in UC Medications - No data to display  Initial Impression / Assessment and Plan / UC Course  I have reviewed the triage vital signs and the nursing notes.  Pertinent labs & imaging results that were available during my care of the patient were reviewed by me and considered in my medical decision making (see chart for details).    Given recent admission, PMH recommended further evaluation in the ED for labs, etc. Workup required is beyond urgent care scope. Son will transport her to ED via [place].   Final Clinical Impressions(s) / UC Diagnoses   Final diagnoses:  Itching  Hypotension, unspecified hypotension type  Frailty   Discharge Instructions   None    ED Prescriptions   None    PDMP not reviewed this encounter.   Francene Finders, PA-C 03/17/22  1651

## 2022-03-17 NOTE — ED Provider Triage Note (Signed)
Emergency Medicine Provider Triage Evaluation Note  Rashae Rother , a 71 y.o. female  was evaluated in triage.  Pt complains of itching.  Patient's failure to thrive sent over here from urgent care due to clinical picture, hypotension, and itching.  Patient was put on new medications on 03-07-2022 after being admitted to the hospital for almost 3 weeks.  Patient is profusely itching her groin, chest, and back.  I see marks of excoriation, unsure if there is any true rash.  Review of Systems  Positive:  Negative:   Physical Exam  BP (!) 118/35 (BP Location: Right Arm)   Pulse 73   Resp 20   LMP  (LMP Unknown)   SpO2 99%  Gen:   Awake, cachectic, itching profusely Resp:  Normal effort  MSK:   Moves extremities without difficulty  Other:    Medical Decision Making  Medically screening exam initiated at 7:30 PM.  Appropriate orders placed.  Floretta Gutierrez-Gonzalez was informed that the remainder of the evaluation will be completed by another provider, this initial triage assessment does not replace that evaluation, and the importance of remaining in the ED until their evaluation is complete.  Patient was sent over here from urgent care.  We will order basic labs.   Sherrell Puller, [place] 03/17/22 1931

## 2022-03-17 NOTE — ED Notes (Signed)
Patient is being discharged from the Urgent Care and sent to the Emergency Department via personal vehicle with caregiver . Per Provider Ewell Poe, patient is in need of higher level of care due to needing extensive labs. Patient is aware and verbalizes understanding of plan of care.   Vitals:   03/17/22 1616 03/17/22 1630  BP: (!) 102/54   Pulse:  83  Resp:    Temp:    SpO2:  98%

## 2022-03-17 NOTE — ED Triage Notes (Signed)
Pt from home with caregiver for hypotension and rash all over body

## 2022-03-18 NOTE — ED Notes (Signed)
PATIENT LEFT AMA WITH FAMILY

## 2022-03-18 NOTE — ED Notes (Signed)
Called pt x2 for room, no response. 

## 2022-03-20 ENCOUNTER — Ambulatory Visit: Payer: Self-pay | Admitting: *Deleted

## 2022-03-20 ENCOUNTER — Telehealth: Payer: Self-pay | Admitting: Family Medicine

## 2022-03-20 NOTE — Telephone Encounter (Signed)
  Chief Complaint: Hives Symptoms: Home Health nurse reports pt with severe itching,hives, "CAn hardly see, tiny." seen in [facility] 03/17/22. States only new med is Midodrine given to her in hospital and prescribed. Taking 3x day. Benadryl helping. Son asking if there is an alternate to midodrine. Last took yesterday. BP today per nurse [phone].\, asymptomatic.   Frequency:  03/17/22 Pertinent Negatives: Patient denies  Disposition: [] ED /[] Urgent Care (no appt availability in office) / [] Appointment(In office/virtual)/ []  [place] [facility]/ [] Home Care/ [] Refused Recommended Disposition /[] [place] [facility]/ []  Follow-up with PCP Additional Notes: Please advise. Care advise provided. Home Health nurse Amy's CB # [phone] Reason for Disposition  [1] MODERATE-SEVERE hives persist (i.e., hives interfere with normal activities or work) AND [2] taking antihistamine (e.g., Benadryl, Claritin) > 24 hours  Answer Assessment - Initial Assessment Questions 1. APPEARANCE: "What does the rash look like?"      Hives across chest ,legs "All over" 2. LOCATION: "Where is the rash located?"      "All over but comes and goes." 3. NUMBER: "How many hives are there?"      "Many but very small" 4. SIZE: "How big are the hives?" (inches, cm, compare to coins) "Do they all look the same or is there lots of variation in shape and size?"      "Hardly see" 5. ONSET: "When did the hives begin?" (Hours or days ago)      03/17/22 itching 6. ITCHING: "Does it itch?" If Yes, ask: "How bad is the itch?"    - MILD: doesn't interfere with normal activities   - MODERATE-SEVERE: interferes with work, school, sleep, or other activities      Severe. 7. RECURRENT PROBLEM: "Have you had hives before?" If Yes, ask: "When was the last time?" and "What happened that time?"       8. TRIGGERS: "Were you exposed to any new food, plant, cosmetic product or animal just before the hives began?"      9. OTHER SYMPTOMS: "Do you have  any other symptoms?" (e.g., fever, tongue swelling, difficulty breathing, abdomen pain) BP 98/74  Protocols used: Hives-A-AH

## 2022-03-20 NOTE — Telephone Encounter (Signed)
Routing to PCP. Pt asking for alternative medication.

## 2022-03-21 ENCOUNTER — Other Ambulatory Visit: Payer: Self-pay

## 2022-03-21 NOTE — Telephone Encounter (Signed)
fyi

## 2022-03-21 NOTE — Telephone Encounter (Signed)
I would recommend she stop midodrine if she is having allergic reactions to this.  She will need to speak with her nephrologist at the dialysis center about an alternative as I am unsure.

## 2022-03-21 NOTE — Telephone Encounter (Signed)
Sarah Kelley has called for an update on their previous request for orders  Please contact further when possible

## 2022-03-22 NOTE — Telephone Encounter (Signed)
Pt was called and informed to contact kidney doctor.

## 2022-03-22 NOTE — Telephone Encounter (Signed)
I called Denise/[facility] and she stated that there was a discussion about palliative care when the patient left the hospital and they want to know if they should proceed to contact the patient/ her family.  I told her that I would need to speak to her son and call back.  I called her son, [name], with assistance of Spanish Interpreter [id number]/[employer] and explained the role of palliative care services.  I am not sure he completely understood but he was in agreement to placing a referral for palliative care.   He then said that his mother continues to itch because of her elevated calcium.  He has been giving her benadryl but she continues to itch. I asked him if he spoke to her kidney doctor and he said he spoke to the nurse at dialysis and she was supposed to get in touch with the doctor.  He then stated that he received a call from some office but was not sure where. The thinks it might have been the kidney doctor.  I instructed him to call the kidney doctor and explain his concerns.. he said he has their phone number and would call.  I spoke to Denise/ [facility] again and informed her that the patient's son is willing to speak with them and she said they will contact him.

## 2022-03-23 ENCOUNTER — Other Ambulatory Visit (HOSPITAL_COMMUNITY): Payer: Self-pay

## 2022-03-23 ENCOUNTER — Telehealth: Payer: Self-pay

## 2022-03-24 ENCOUNTER — Ambulatory Visit (HOSPITAL_COMMUNITY)
Admission: EM | Admit: 2022-03-24 | Discharge: 2022-03-24 | Disposition: A | Discharge status: Home / Self Care | Payer: Self-pay | Attending: Physician Assistant | Admitting: Physician Assistant

## 2022-03-24 ENCOUNTER — Other Ambulatory Visit: Payer: Self-pay

## 2022-03-24 ENCOUNTER — Other Ambulatory Visit (HOSPITAL_COMMUNITY): Payer: Self-pay

## 2022-03-24 ENCOUNTER — Other Ambulatory Visit: Payer: Self-pay | Admitting: Family Medicine

## 2022-03-24 DIAGNOSIS — Z760 Encounter for issue of repeat prescription: Secondary | ICD-10-CM

## 2022-03-24 DIAGNOSIS — I953 Hypotension of hemodialysis: Secondary | ICD-10-CM

## 2022-03-24 MED ORDER — MIDODRINE HCL 10 MG PO TABS: 10.0000 mg | ORAL_TABLET | Freq: Three times a day (TID) | ORAL | 0 refills | Status: DC

## 2022-03-24 NOTE — Telephone Encounter (Signed)
Requested medication (s) are due for refill today:   Provider to review  Requested medication (s) are on the active medication list:   Yes  Future visit scheduled:   Yes   Last ordered: 03/08/2022 #30, 0 refills  Non delegated refill reason returned.   Requested Prescriptions  Pending Prescriptions Disp Refills   midodrine (PROAMATINE) 10 MG tablet 30 tablet 0    Sig: Take 1 tablet (10 mg total) by mouth 3 (three) times daily with meals.     Not Delegated - Cardiovascular: Midodrine Failed - 03/24/2022  3:11 PM      Failed - This refill cannot be delegated      Failed - Cr in normal range and within 360 days    Creat  Date Value Ref Range Status  01/13/2016 3.97 (H) 0.50 - 0.99 mg/dL Final    Comment:      For patients > or = 73 years of age: The upper reference limit for Creatinine is approximately 13% higher for people identified as African-American.      Creatinine, Ser  Date Value Ref Range Status  03/17/2022 4.92 (H) 0.44 - 1.00 mg/dL Final   Creatinine, Urine  Date Value Ref Range Status  01/13/2016 95 20 - 320 mg/dL Final         Passed - ALT in normal range and within 360 days    ALT  Date Value Ref Range Status  03/17/2022 27 0 - 44 U/L Final         Passed - AST in normal range and within 360 days    AST  Date Value Ref Range Status  03/17/2022 34 15 - 41 U/L Final         Passed - Last BP in normal range    BP Readings from Last 1 Encounters:  03/18/22 (!) 121/50         Passed - Valid encounter within last 12 months    Recent Outpatient Visits           4 months ago Type 2 diabetes mellitus with chronic kidney disease on chronic dialysis, with long-term current use of insulin (HCC)   [place], [place], MD   9 months ago Type 2 diabetes mellitus with chronic kidney disease on chronic dialysis, with long-term current use of insulin ([place])   [place], Neoma Laming B, MD    1 year ago Type 2 diabetes mellitus with chronic kidney disease on chronic dialysis, with long-term current use of insulin ([place])   [place], Charlane Ferretti, MD   1 year ago Muscle cramp   [place], [place], MD   1 year ago Type 2 diabetes mellitus with chronic kidney disease on chronic dialysis, with long-term current use of insulin ([place])   [place], Loraine Grip, PA-C       Future Appointments             In 1 week Oletta Lamas, Milford Cage, NP [place]   In 1 month [name], MD [place]

## 2022-03-24 NOTE — Discharge Instructions (Addendum)
I have sent in a refill.  Pick this up from the pharmacy.  Follow-up with nephrology (kidney doctor) tomorrow at dialysis for refill.  If she has any signs of allergic reaction including rash she needs to go to the emergency room.

## 2022-03-24 NOTE — ED Triage Notes (Signed)
Pt is here with caregiver requesting a refill on  midodrine.

## 2022-03-24 NOTE — ED Provider Notes (Signed)
[facility]    CSN: [id number] Arrival date & time: 03/24/22  1836      History   Chief Complaint No chief complaint on file.   HPI Sarah Kelley is a 71 y.o. female.   Patient presents today accompanied by her son who is her primary caregiver and provided the majority of history.  Son is Spanish-speaking and video interpreter was utilized for visit.  Patient has significant past medical history including dementia and ESRD on Tuesday/Thursday/Saturday hemodialysis.  Son is requesting a refill of her midodrine.  She has been on this medication for several weeks after being hospitalized at which time she had significant hypotension.  She has been doing well on this medication and has not missed any doses.  He is going to run out of this medicine as he only has 2 doses left prompting evaluation.  There is a note in the system that there might be concern for allergy, however, this was because she was having widespread pruritus.  This has since been attributed to hypercalcemia and other electrolyte disturbances and she has continued taking the medication without rash or obvious allergic reaction.  Reports she is doing well on this medication and denies any side effects or concerns.    Past Medical History:  Diagnosis Date   Dementia without behavioral disturbance ([place]) 02/20/2022   Diabetes mellitus    ESRD (end stage renal disease) ([place])    dialysis T/Th/Sa   GERD (gastroesophageal reflux disease)    Hyperlipidemia    Hypertension    Iron deficiency anemia    MRSA infection    hx of   Neuropathy    Diabetic   Pneumonia 07/2016   Seizure-like activity ([place])    Sepsis ([place])    Thyroid disease    Wears dentures     Patient Active Problem List   Diagnosis Date Noted   Pain 02/24/2022   Bilateral pleural effusion 02/22/2022   Malnutrition of moderate degree 02/21/2022   Dementia without behavioral disturbance ([place]) 02/20/2022   Hypokalemia 02/20/2022    Enterocolitis 02/18/2022   Hypothyroidism 02/18/2022   Elevated troponin 02/18/2022   Uremia 07/14/2020   Hyperkalemia 07/14/2020   Hypoglycemia    Elevated liver enzymes 06/14/2020   Other constipation 06/13/2020   AVF (arteriovenous fistula) (HCC)    MSSA (methicillin susceptible Staphylococcus aureus) infection    Nausea & vomiting 02/11/2018   Insomnia 01/18/2017   Altered mental status    Goals of care, counseling/discussion    Dysphagia 12/04/2016   UTI (urinary tract infection) 12/04/2016   Severe sepsis ([place]) 12/02/2016   Hyperglycemia 12/02/2016   Acute encephalopathy 07/28/2016   CAP (community acquired pneumonia) 07/28/2016   Pneumonia 07/28/2016   ESRD (end stage renal disease) ([place])    Phantom pain ([place]) 09/13/2015   Hyperlipidemia 06/01/2015   Toe amputation status    Insulin-requiring or dependent type II diabetes mellitus ([place])    Chronic kidney disease (CKD) stage G4/A1, severely decreased glomerular filtration rate (GFR) between 15-29 mL/min/1.73 square meter and albuminuria creatinine ratio less than 30 mg/g ([place]) 05/18/2015   Type 2 diabetes mellitus with chronic kidney disease on chronic dialysis, with long-term current use of insulin ([place]) 05/18/2015   Osteomyelitis ([place]) 05/18/2015   Generalized abdominal pain 05/18/2015   Osteomyelitis of left foot ([place]) 05/18/2015   Diabetic neuropathy ([place]) 01/14/2007   Diabetes mellitus without complication ([place]) 38/46/6599   HLD (hyperlipidemia) 01/14/2007   Normocytic anemia 01/14/2007   ANXIETY 01/14/2007   Depression  01/14/2007   Essential hypertension 01/14/2007    Past Surgical History:  Procedure Laterality Date   AMPUTATION Left 05/21/2015   Procedure: Left Foot 3rd Ray Amputation;  Surgeon: Newt Minion, MD;  Location: [facility];  Service: Orthopedics;  Laterality: Left;   AV FISTULA PLACEMENT Left 03/28/2016   Procedure: ARTERIOVENOUS (AV) FISTULA CREATION LEFT ARM;  Surgeon: Waynetta Sandy, MD;   Location: [facility];  Service: Vascular;  Laterality: Left;   AV FISTULA PLACEMENT Right 06/04/2019   Procedure: ARTERIOVENOUS (AV) FISTULA CREATION RIGHT ARM;  Surgeon: Waynetta Sandy, MD;  Location: [facility];  Service: Vascular;  Laterality: Right;   AV FISTULA PLACEMENT Left 03/11/2021   Procedure: INSERTION OF LEFT ARM ARTERIOVENOUS (AV) GORE-TEX GRAFT;  Surgeon: Waynetta Sandy, MD;  Location: [facility];  Service: Vascular;  Laterality: Left;   DILATION AND CURETTAGE OF UTERUS     EYE SURGERY     FISTULA SUPERFICIALIZATION Left 09/04/2016   Procedure: FISTULA SUPERFICIALIZATION LEFT UPPER ARM;  Surgeon: Waynetta Sandy, MD;  Location: [facility];  Service: Vascular;  Laterality: Left;   FISTULA SUPERFICIALIZATION Right 10/08/2019   Procedure: FISTULA SUPERFICIALIZATION OR RIGHT ARM;  Surgeon: Waynetta Sandy, MD;  Location: [facility];  Service: Vascular;  Laterality: Right;   IR AV DIALY SHUNT INTRO NEEDLE/INTRAC INITIAL W/PTA/STENT/IMG LT Left 06/13/2017   IR AV DIALY SHUNT INTRO NEEDLE/INTRAC INITIAL W/PTA/STENT/IMG LT Left 09/12/2017   IR AV DIALY SHUNT INTRO NEEDLE/INTRACATH INITIAL W/PTA/IMG LEFT  11/10/2016   IR AV DIALY SHUNT INTRO NEEDLE/INTRACATH INITIAL W/PTA/IMG LEFT  03/09/2017   IR AV DIALY SHUNT INTRO NEEDLE/INTRACATH INITIAL W/PTA/IMG LEFT  12/12/2017   IR AV DIALY SHUNT INTRO NEEDLE/INTRACATH INITIAL W/PTA/IMG LEFT  02/27/2018   IR AV DIALY SHUNT INTRO NEEDLE/INTRACATH INITIAL W/PTA/IMG LEFT  06/05/2018   IR AV DIALY SHUNT INTRO NEEDLE/INTRACATH INITIAL W/PTA/IMG LEFT  09/11/2018   IR AV DIALY SHUNT INTRO NEEDLE/INTRACATH INITIAL W/PTA/IMG LEFT  12/26/2021   IR AV DIALY SHUNT INTRO NEEDLE/INTRACATH INITIAL W/PTA/IMG RIGHT Right 04/21/2020   IR AV DIALY SHUNT INTRO NEEDLE/INTRACATH INITIAL W/PTA/IMG RIGHT Right 10/08/2020   IR DIALY SHUNT INTRO NEEDLE/INTRACATH INITIAL W/IMG LEFT Left 05/08/2018   IR DIALY SHUNT INTRO NEEDLE/INTRACATH INITIAL W/IMG LEFT Left 06/17/2021    IR FLUORO GUIDE CV LINE RIGHT  02/01/2019   IR FLUORO GUIDE CV LINE RIGHT  07/14/2020   IR FLUORO GUIDE CV LINE RIGHT  12/29/2020   IR GENERIC HISTORICAL  08/01/2016   IR FLUORO GUIDE CV LINE RIGHT 08/01/2016 [name], MD [facility]   IR GENERIC HISTORICAL  08/01/2016   IR US GUIDE VASC ACCESS RIGHT 08/01/2016 [name], MD [facility]   IR REMOVAL TUN CV CATH W/O FL  11/01/2016   IR REMOVAL TUN CV CATH W/O FL  01/02/2020   IR REMOVAL TUN CV CATH W/O FL  05/04/2021   IR THROMBECTOMY AV FISTULA W/THROMBOLYSIS INC/SHUNT/IMG LEFT Left 01/31/2019   IR THROMBECTOMY AV FISTULA W/THROMBOLYSIS/PTA INC/SHUNT/IMG LEFT Left 01/27/2019   IR THROMBECTOMY AV FISTULA W/THROMBOLYSIS/PTA INC/SHUNT/IMG LEFT Left 07/16/2020   IR THROMBECTOMY AV FISTULA W/THROMBOLYSIS/PTA/STENT INC/SHUNT/IMG RT Right 09/24/2020   IR US GUIDE VASC ACCESS LEFT  11/10/2016   IR US GUIDE VASC ACCESS LEFT  09/12/2017   IR US GUIDE VASC ACCESS LEFT  12/12/2017   IR US GUIDE VASC ACCESS LEFT  06/05/2018   IR US GUIDE [place] LEFT  09/11/2018   IR US GUIDE [place] LEFT  01/27/2019   IR US  GUIDE VASC ACCESS LEFT  02/01/2019   IR US GUIDE VASC ACCESS LEFT  12/26/2021   IR US GUIDE VASC ACCESS RIGHT  02/01/2019   IR US GUIDE VASC ACCESS RIGHT  07/14/2020   IR US GUIDE VASC ACCESS RIGHT  07/16/2020   IR US GUIDE VASC ACCESS RIGHT  10/08/2020   IR US GUIDE VASC ACCESS RIGHT  12/29/2020   PERIPHERAL VASCULAR BALLOON ANGIOPLASTY Left 07/22/2021   Procedure: PERIPHERAL VASCULAR BALLOON ANGIOPLASTY;  Surgeon: Cherre Robins, MD;  Location: [facility];  Service: Cardiovascular;  Laterality: Left;   UPPER EXTREMITY VENOGRAPHY Bilateral 02/28/2021   Procedure: UPPER EXTREMITY VENOGRAPHY;  Surgeon: Waynetta Sandy, MD;  Location: [facility];  Service: Cardiovascular;  Laterality: Bilateral;    OB History   No obstetric history on file.      Home Medications    Prior to Admission medications   Medication Sig Start Date End  Date Taking? Authorizing Provider  acetaminophen (TYLENOL) 325 MG tablet Take 2 tablets (650 mg total) by mouth every 6 (six) hours as needed for mild pain (or Fever >/= 101). 03/08/22   Sheikh, Omair Latif, DO  atorvastatin (LIPITOR) 40 MG tablet TAKE 1 TABLET (40 MG TOTAL) BY MOUTH DAILY. (office visit for refills) Patient taking differently: Take 40 mg by mouth daily. 11/16/21 11/16/22  [name], MD  midodrine (PROAMATINE) 10 MG tablet Take 1 tablet (10 mg total) by mouth 3 (three) times daily with meals. 03/24/22   Noemie Devivo, Derry Skill, PA-C  Nutritional Supplements (,FEEDING SUPPLEMENT, PROSOURCE PLUS) liquid Take 30 mLs by mouth 2 (two) times daily between meals. 03/09/22   Raiford Noble Latif, DO  Nutritional Supplements (FEEDING SUPPLEMENT, NEPRO CARB STEADY,) LIQD Take 237 mLs by mouth See admin instructions. Take 1 can (237 ml) by mouth once daily on Tuesdays, Thursdays & Saturday morning. May take an additional can (237 ml) by mouth if needed for poor appetite.    [provider]  ondansetron (ZOFRAN) 4 MG tablet Take 1 tablet (4 mg total) by mouth every 6 (six) hours as needed for nausea. 03/08/22   Raiford Noble Latif, DO  pantoprazole (PROTONIX) 40 MG tablet Take 1 tablet (40 mg total) by mouth daily. 03/09/22   Raiford Noble Latif, DO  thiamine (VITAMIN B1) 100 MG tablet Take 1 tablet (100 mg total) by mouth daily. 03/09/22   Kerney Elbe, DO    Family History No family history on file.  Social History Social History   Tobacco Use   Smoking status: Never   Smokeless tobacco: Never  Vaping Use   Vaping Use: Never used  Substance Use Topics   Alcohol use: Not Currently    Alcohol/week: 0.0 standard drinks of alcohol    Comment: rare- a beer every now and then   Drug use: No     Allergies   Patient has no known allergies.   Review of Systems Review of Systems  Unable to perform ROS: Dementia     Physical Exam Triage Vital Signs ED Triage Vitals [03/24/22  1851]  Enc Vitals Group     BP (!) 115/43     Pulse Rate 73     Resp 18     Temp 98.7 F (37.1 C)     Temp Source Oral     SpO2 98 %     Weight      Height      Head Circumference      Peak  Flow      Pain Score      Pain Loc      Pain Edu?      Excl. in [place]?    No data found.  Updated Vital Signs BP (!) 115/43 (BP Location: Left Arm)   Pulse 73   Temp 98.7 F (37.1 C) (Oral)   Resp 18   LMP  (LMP Unknown)   SpO2 98%   Visual Acuity Right Eye Distance:   Left Eye Distance:   Bilateral Distance:    Right Eye Near:   Left Eye Near:    Bilateral Near:     Physical Exam Vitals reviewed.  Constitutional:      General: She is awake. She is not in acute distress.    Appearance: Normal appearance. She is well-developed. She is ill-appearing.     Comments: Older frail-appearing woman sitting in wheelchair no acute distress  HENT:     Head: Normocephalic and atraumatic.  Cardiovascular:     Rate and Rhythm: Normal rate and regular rhythm.     Heart sounds: Normal heart sounds, S1 normal and S2 normal. No murmur heard. Pulmonary:     Effort: Pulmonary effort is normal.     Breath sounds: Normal breath sounds. No wheezing, rhonchi or rales.     Comments: Clear to auscultation bilaterally Skin:    Findings: No rash.  Psychiatric:        Behavior: Behavior is cooperative.      UC Treatments / Results  Labs (all labs ordered are listed, but only abnormal results are displayed) Labs Reviewed - No data to display  EKG   Radiology No results found.  Procedures Procedures (including critical care time)  Medications Ordered in UC Medications - No data to display  Initial Impression / Assessment and Plan / UC Course  I have reviewed the triage vital signs and the nursing notes.  Pertinent labs & imaging results that were available during my care of the patient were reviewed by me and considered in my medical decision making (see chart for details).      Patient's blood pressure is soft but at baseline in clinic today.  Son reports that without midodrine her blood pressure becomes dangerously low.  Refill for 1 week was sent to pharmacy.  Discussed that this is not a medication we typically prescribe for long periods of time as this would need to come from her nephrologist.  She is scheduled to have dialysis tomorrow and strongly encouraged on to discuss this with dialysis team tomorrow.  Discussed that if she develops any evidence of an allergic reaction including itching or rash she needs to be seen immediately.  Strict return precautions given.  Final Clinical Impressions(s) / UC Diagnoses   Final diagnoses:  Hemodialysis-associated hypotension  Medication refill     Discharge Instructions      I have sent in a refill.  Pick this up from the pharmacy.  Follow-up with nephrology (kidney doctor) tomorrow at dialysis for refill.  If she has any signs of allergic reaction including rash she needs to go to the emergency room.     ED Prescriptions     Medication Sig Dispense Auth. Provider   midodrine (PROAMATINE) 10 MG tablet Take 1 tablet (10 mg total) by mouth 3 (three) times daily with meals. 21 tablet Bradley Bostelman, Derry Skill, PA-C      PDMP not reviewed this encounter.   Veleta Miners 03/24/22 1923

## 2022-03-24 NOTE — Telephone Encounter (Signed)
Lemmie Evens has contacted patient's son [name] and scheduled a Palliative Care consult for 04/03/22 @ 12:30 PM.

## 2022-04-03 ENCOUNTER — Telehealth: Payer: Self-pay | Admitting: Internal Medicine

## 2022-04-04 ENCOUNTER — Other Ambulatory Visit: Payer: Self-pay

## 2022-04-04 ENCOUNTER — Ambulatory Visit: Payer: Self-pay | Admitting: Family Medicine

## 2022-04-04 MED ORDER — MIDODRINE HCL 10 MG PO TABS
10.0000 mg | ORAL_TABLET | Freq: Three times a day (TID) | ORAL | 2 refills | Status: AC
  Filled 2022-04-04: qty 90, 30d supply, fill #0
  Filled 2022-05-18: qty 90, 30d supply, fill #1

## 2022-04-05 ENCOUNTER — Ambulatory Visit (INDEPENDENT_AMBULATORY_CARE_PROVIDER_SITE_OTHER): Payer: Self-pay | Admitting: Primary Care

## 2022-04-05 ENCOUNTER — Encounter (INDEPENDENT_AMBULATORY_CARE_PROVIDER_SITE_OTHER): Payer: Self-pay | Admitting: Primary Care

## 2022-04-05 VITALS — BP 110/57 | HR 78 | Resp 16

## 2022-04-05 DIAGNOSIS — R1311 Dysphagia, oral phase: Secondary | ICD-10-CM

## 2022-04-05 DIAGNOSIS — Z09 Encounter for follow-up examination after completed treatment for conditions other than malignant neoplasm: Secondary | ICD-10-CM

## 2022-04-05 DIAGNOSIS — R627 Adult failure to thrive: Secondary | ICD-10-CM

## 2022-04-05 DIAGNOSIS — E039 Hypothyroidism, unspecified: Secondary | ICD-10-CM

## 2022-04-05 DIAGNOSIS — Z992 Dependence on renal dialysis: Secondary | ICD-10-CM

## 2022-04-05 DIAGNOSIS — N186 End stage renal disease: Secondary | ICD-10-CM

## 2022-04-05 NOTE — Patient Instructions (Signed)
Plan de alimentacin para la disfagia con alimentos del tamao de un bocado Dysphagia Eating Plan, Bite Size Food Esta dieta se recomienda a las personas que no pueden morder trozos de Museum/gallery conservator que pueden Engineer, manufacturing systems. Es posible que necesite esta dieta si tiene debilidad en los msculos que controlan la deglucin, tiene un mayor riesgo de asfixia o sufre fatiga al Engineer, manufacturing systems. Los alimentos de la dieta son blandos, tiernos y Set designer. Trabaje junto con su mdico, el especialista en alimentacin y nutricin (nutricionista) o el fonoaudilogo para seguir un plan de alimentacin de Geographical information systems officer segura y obtener todos los nutrientes que necesita. Cules son algunos consejos para seguir este plan? Al cocinar Para humedecer los alimentos, agregue lquidos en el momento de licuarlos, hacerlos pur o molerlos para lograr la consistencia adecuada. Estos lquidos incluyen salsas, jugos de frutas o vegetales, [place], mitad leche y [place], [place]. Cuele el lquido sobrante de las comidas antes de ingerirlas. Recaliente los alimentos lentamente para evitar que se forme una costra dura. Prepare las comidas con anticipacin. Planificacin de las comidas Coma diferentes alimentos para obtener todos los nutrientes que necesita. Algunos alimentos se pueden tolerar mejor que otros. Identifique junto con su fonoaudilogo los alimentos que menos riesgos suponen para usted. Siga el plan de alimentacin como se lo haya indicado el nutricionista. Informacin general Usted puede comer alimentos que sean tiernos, blandos y hmedos. Siempre pruebe la textura del alimento antes de tomar un bocado. Pinche el alimento con un tenedor o una cuchara para asegurarse de que est tierno. La muestra de prueba debe poder aplastarse, romperse o cambiar de forma, y no debe volver a su forma original cuando se retira el tenedor o la cuchara. Los alimentos deben ser fciles de cortar y Engineer, manufacturing systems. No consuma grandes trozos de Consulting civil engineer. Ingiera bocados pequeos. Cada bocado debe ser ms pequeo que la ua del pulgar (unos 15 mm por 15 mm para los adultos, y 8 mm por 8 mm para los nios). Si usted tena un plan de alimentacin con alimentos hechos pur o picados, puede comer cualquiera de los alimentos incluidos en esa dieta. Evite los alimentos que sean muy secos, duros, pegajosos, gomosos, speros o crocantes. Si el mdico se lo indica, espese los lquidos. Siga las instrucciones del mdico respecto a qu productos debe Risk manager, cmo Nature conservation officer y con qu consistencia. Qu alimentos debo comer?        Frutas Frutas cocidas o enlatadas que estn blandas o hmedas y no tengan piel ni semillas. Bananas blandas y frescas. Verduras Vegetales blandos y bien cocidos en trozos pequeos. Papa blanda cocida, en forma de pur. Cereales Panes hmedos sin frutos secos ni semillas. Bizcochos, muffins, panqueques y waffles bien humedecidos con [place], [place], [place] o [place]. Cereales cocidos. Relleno para pan hmedo. Arroz humedecido. Cereal fro bien humedecido en trozos pequeos. Pasta, fideos y [place] Nick bien cocidos en trozos pequeos y [employer]. Albndigas blandas o spaetzle en trozos pequeos con [place]. Carnes y otras protenas Carnes y aves tiernas y jugosas en pequeos trozos. Albndigas o pan de carne jugosos. Pescado sin espinas. Huevos y sustitutos del huevo en trozos pequeos. Tofu. [place] y carnes alternativas en trozos pequeos. Frijoles y guisantes bien cocidos y tiernos, y Scientist, research (medical) legumbres. Lcteos Leche. Queso crema. Yogur. [employer]. Phoebe Sharps. Trozos pequeos de queso blando. Grasas y [employer]. Grasas. Margarina. [place]. Salsas. Productos untables. Dulces y [employer], blandos y [name]. Pudn. Natillas. Tortas hmedas. Mermelada. Jalea. Miel. Conservas.  Pregntele a su mdico si puede comer postres helados. Condimentos y otros [employer]  condimentos y [place]. Todas las salsas con trozos pequeos. [place] preparada con atn, huevo o pollo sin frutas ni vegetales crudos. Guisos hmedos con trozos de carne pequeos y tiernos. Sopas con carne tierna. Es posible que los productos que se enumeran ms [place] no constituyan una lista completa de los alimentos y las bebidas que puede consumir. Consulte a un nutricionista para obtener ms informacin. Qu alimentos debo evitar? Frutas Frutas crudas duras, crocantes, fibrosas, con mucha pulpa y [place], como manzanas, pia, papaya y sanda. Frutas redondas y [place], como las uvas. Lambert Mody deshidratadas y frutas desecadas. Verduras Todas las verduras crudas. Maz cocido. Vegetales cocidos de consistencia gomosa o duros. Vegetales fibrosos, como el apio. Papas fritas duras y crocantes. Cscara de papas. Cereales Cereales de granos gruesos o secos. Panes secos. Tostadas. Galletas. Panes duros y con corteza, como pan francs o baguettes. Panqueques, waffles y muffins secos. Arroz pegajoso. Relleno para pan seco. Granola. Palomitas de maz. Papas fritas. Carnes y otras protenas Trozos grandes de carne. Carnes secas y duras, como tocino, salchichas y hot dogs. Pollo, pavo o pescado con piel y huesos o espinas. [place] de man crocante. Frutos secos. Semillas. Nueces y [place] de semillas. Lcteos Yogur con frutos secos, semillas o grandes trozos. Trozos grandes de [place]. Dulces y postres Tortas secas. Galletas secas o gomosas. Todo postre que tenga frutos secos, semillas, frutas secas, coco, pia o cualquier cosa que sea seca, pegajosa o dura. [place]. [place]al[place]. Caramelos masticables. Pregntele a su mdico si puede comer postres helados. Condimentos y otros alimentos Sopas con trozos grandes o duros de carne de vaca, de ave o vegetales. Sopa de maz o almejas. Batidos de frutas con grandes trozos de frutas. Es posible que los productos que se enumeran ms  [place] no constituyan una lista completa de los alimentos y las bebidas que Nurse, adult. Consulte a un nutricionista para obtener ms informacin. Resumen Los alimentos del tamao de un bocado pueden ser tiles para las personas con problemas para tragar. En este plan de alimentacin para la disfagia, se pueden ingerir alimentos blandos, hmedos y cortados en trozos ms pequeos que la ua del pulgar (unos 15 mm x 15 mm para los adultos, y unos 8 mm x 8 mm para los nios). Es posible que le indiquen que [employer] lquidos. Siga las instrucciones del mdico con respecto a cmo Nature conservation officer y con qu consistencia. Esta informacin no tiene [name] el consejo del mdico. Asegrese de hacerle al mdico cualquier pregunta que tenga. Document Revised: 08/31/2021 Document Reviewed: 08/31/2021 Elsevier Patient Education  [place].

## 2022-04-05 NOTE — Progress Notes (Signed)
[facility]   Subjective:  [name] is a 71 y.o. Hispanic female( interpreter Sarah Kelley [phone])  presents  to the ED  hospital follow up in a wheel chair with her son Sarah Kelley. Patient gave son permission to be at her visit .Patient presented to Urgent care  03/24/22, concern that his mother was going to run out of medication and requesting medication refill. Hospitalization patient presented to the ED with 2 week h/o poor  intake, diaphoresis over past 3-4 days and profuse diarrhea. She also felt food gets stuck in throat.  Admitted to the hospital on 8/19-03/09/22 diagnoses hemodialysis-associated hypotension.Today patient she/son is concerned with difficulty swallowing and unable to swallow without chocking. This causes her not to eat.  Past Medical History:  Diagnosis Date   Dementia without behavioral disturbance ([place]) 02/20/2022   Diabetes mellitus    ESRD (end stage renal disease) ([place])    dialysis T/Th/Sa   GERD (gastroesophageal reflux disease)    Hyperlipidemia    Hypertension    Iron deficiency anemia    MRSA infection    hx of   Neuropathy    Diabetic   Pneumonia 07/2016   Seizure-like activity ([place])    Sepsis ([place])    Thyroid disease    Wears dentures      No Known Allergies    Current Outpatient Medications on File Prior to Visit  Medication Sig Dispense Refill   acetaminophen (TYLENOL) 325 MG tablet Take 2 tablets (650 mg total) by mouth every 6 (six) hours as needed for mild pain (or Fever >/= 101).     atorvastatin (LIPITOR) 40 MG tablet TAKE 1 TABLET (40 MG TOTAL) BY MOUTH DAILY. (office visit for refills) (Patient taking differently: Take 40 mg by mouth daily.) 30 tablet 6   midodrine (PROAMATINE) 10 MG tablet Take 1 tablet (10 mg total) by mouth 3 (three) times daily with meals. 21 tablet 0   midodrine (PROAMATINE) 10 MG tablet Take 1 tablet (10 mg total) by mouth 3 (three) times daily. 90 tablet 2   Nutritional Supplements (,FEEDING  SUPPLEMENT, PROSOURCE PLUS) liquid Take 30 mLs by mouth 2 (two) times daily between meals. 887 mL 0   Nutritional Supplements (FEEDING SUPPLEMENT, NEPRO CARB STEADY,) LIQD Take 237 mLs by mouth See admin instructions. Take 1 can (237 ml) by mouth once daily on Tuesdays, Thursdays & Saturday morning. May take an additional can (237 ml) by mouth if needed for poor appetite.     ondansetron (ZOFRAN) 4 MG tablet Take 1 tablet (4 mg total) by mouth every 6 (six) hours as needed for nausea. 20 tablet 0   pantoprazole (PROTONIX) 40 MG tablet Take 1 tablet (40 mg total) by mouth daily. 30 tablet 0   thiamine (VITAMIN B1) 100 MG tablet Take 1 tablet (100 mg total) by mouth daily. 30 tablet 0   Current Facility-Administered Medications on File Prior to Visit  Medication Dose Route Frequency Provider Last Rate Last Admin   0.9 %  sodium chloride infusion   Intravenous Continuous Monia Sabal, PA-C         Review of System: Comprehensive ROS Pertinent positive and negative noted in HPI    Objective:  BP (!) 110/57   Pulse 78   Resp 16   LMP  (LMP Unknown)   SpO2 99%   Filed Weights   Physical Exam: General Appearance: Well nourished, in no apparent distress. Eyes: PERRLA, EOMs, conjunctiva no swelling or erythema Sinuses: No Frontal/maxillary tenderness ENT/Mouth:  Ext aud canals clear, TMs without erythema, bulging. No erythema, swelling, or exudate on post pharynx.  Tonsils not swollen or erythematous. Hearing normal.  Neck: Supple, thyroid normal.  Respiratory: Respiratory effort normal, BS equal bilaterally without rales, rhonchi, wheezing or stridor.  Cardio: RRR with no MRGs. Brisk peripheral pulses without edema.  Abdomen: Soft, + BS.  Non tender, no guarding, rebound, hernias, masses. Lymphatics: Non tender without lymphadenopathy.  Musculoskeletal: Full ROM, 5/5 strength, normal gait.  Skin: Warm, dry without rashes, lesions, ecchymosis.  Neuro: Cranial nerves intact. Normal muscle  tone, no cerebellar symptoms. Sensation intact.  Psych: demented at baseline, normal affect, cooperative  Assessment:  Sarah Kelley was seen today for hospitalization follow-up.  Diagnoses and all orders for this visit:  Adult failure to thrive Oral phase dysphagia -     SLP modified barium swallow; Future  Hospital discharge follow-up diagnoses hemodialysis-associated hypotension,   ESRD on dialysis [facility]) Compliant with RRTx and nephrology appointments diagnosis hemodialysis with associated hypotension  This note has been created with Dragon speech recognition software and Engineer, materials. Any transcriptional errors are unintentional.   Kerin Perna, NP 04/05/2022, 9:36 AM

## 2022-04-10 ENCOUNTER — Ambulatory Visit: Payer: Self-pay | Admitting: Internal Medicine

## 2022-04-10 DIAGNOSIS — R1319 Other dysphagia: Secondary | ICD-10-CM

## 2022-04-10 DIAGNOSIS — F0390 Unspecified dementia without behavioral disturbance: Secondary | ICD-10-CM

## 2022-04-10 DIAGNOSIS — N186 End stage renal disease: Secondary | ICD-10-CM

## 2022-04-10 DIAGNOSIS — Z515 Encounter for palliative care: Secondary | ICD-10-CM

## 2022-04-10 NOTE — Progress Notes (Signed)
[facility] Palliative Care Consult Note Telephone: [phone]  Fax: [phone]   Date of encounter: 04/10/22 8:44 AM PATIENT NAME: Sarah Kelley [address] [place] [address]   [phone] (home)  DOB: Nov 26, 1950 MRN: [id number] PRIMARY CARE PROVIDER:    [name], MD,  [place] [address] [phone]  REFERRING PROVIDER:   [name], MD [place],  [place] [address] [phone]  RESPONSIBLE PARTY:    Contact Information     Name Relation Home Work Sarah Kelley Son [phone]  [phone]   Sarah Kelley Daughter [place]   Sarah Kelley [phone]  [phone]   Sarah Kelley Daughter [phone]  [phone]        Due to the COVID-19 crisis, this visit was done via telemedicine from my office and it was initiated and consent by this patient and or family.  I connected with  Sarah Kelley OR PROXY on 04/10/22 by a telemedicine application and verified that I am speaking with the correct person using two identifiers.  This patient did not have the ability to connect via a video-enabled capacity.  Sarah Kelley, interpreted for me during a three way phone call.   I discussed the limitations of evaluation and management by telemedicine. The patient expressed understanding and agreed to proceed.  Palliative Care was asked to follow this patient by consultation request of  Sarah Rakes, MD to address advance care planning and complex medical decision making. This is the initial visit.                                     ASSESSMENT AND PLAN / RECOMMENDATIONS:   Advance Care Planning/Goals of Care: Goals include to maximize quality of life and symptom management. Patient/health care surrogate gave his/her permission to discuss.Our advance care planning conversation included a  discussion about:    The value and importance of advance care planning  Experiences with loved ones who have been seriously ill or have died  Exploration of personal, cultural or spiritual beliefs that might influence medical decisions  Exploration of goals of care in the event of a sudden injury or illness  Identification  of a healthcare agent  Review and updating or creation of an  advance directive document . Decision not to resuscitate or to de-escalate disease focused treatments due to poor prognosis. CODE STATUS:  DNR; but need to address MOST form in person   Symptom Management/Plan: 1. ESRD (end stage renal disease) ([place]) -Sarah Kelley feels uncomfortable making a decision without our opinion about her prognosis/overall condition as she has not shared her wishes with him before and other family may not support his decision though he has been elected to decide for her -will address more directly in person at her home with help of interpreter there  2. Dementia without behavioral disturbance ([place]) -impacting her intake, makes her incapable of deciding on her own about dialysis -will assess staging of this more at home visit  3. Palliative care by specialist -educated about roles of palliative care, hospice care   4.  Esophageal Dysphagia -having episodes of choking and poor intake due to mix of her dementia and ESRD/end of life status -had prior swallow study showing this -will provide more education about this when I see pt in person with her son  Follow up Palliative Care Visit:  Palliative care will continue to follow for complex medical decision making, advance care planning, and clarification of goals. Return 04/24/2022 at 11 am in person with Sarah Kelley interpreting.  This visit was coded based on medical decision making (MDM).  PPS: 40%  HOSPICE ELIGIBILITY/DIAGNOSIS: Yes, if stops HD/ESRD, dementia with dysphagia  Chief Complaint: Palliative care consult televisit  HISTORY OF  PRESENT ILLNESS:  Sarah Kelley is a 71 y.o. year old female  with ESRD on HD, dementia, dysphagia.   She is getting dialysis for ESRD.    She is actually doing better after d/c from hospital.    The biggest issue right now is her appetite.  Has been going on for a month or two.  She is eating once a day and he's encouraging protein intake.  He's concerned she will turn down food, but she will eat snacks.  She is having two protein shakes per day.    Not dizzy lately.  Cannot describe not feeling well clearly to him.    She often has abdominal pain, as well.  He uses tylenol for this.  Going about every day.    She only urinates a little bit.  She does not drink much either--only permitted to drink 32 oz per day.    Sarah Kelley says his mom cannot make decisions for herself so he's afraid to make decisions for her.  She never shared with him her opinions about stopping dialysis.  They have had a large family.  They don't talk about serious topics.  Family was a little upset with him, but he is not ready for dialysis to stop.  When he feels she cannot go on, he would decide this.      History obtained from review of EMR, discussion with primary team, and interview with family, facility staff/caregiver and/or Sarah Kelley.   I reviewed available labs, medications, imaging, studies and related documents from the EMR.  Records reviewed and summarized above.   ROS Review of Systemssee hpi     Wt Readings from Last 500 Encounters:  03/17/22 110 lb (49.9 kg)  03/09/22 110 lb 14.3 oz (50.3 kg)  12/26/21 132 lb 4.4 oz (60 kg)  07/22/21 149 lb 14.6 oz (68 kg)  06/17/21 149 lb 14.6 oz (68 kg)  02/28/21 132 lb 4.4 oz (60 kg)  12/29/20 140 lb (63.5 kg)  10/08/20 133 lb 6.1 oz (60.5 kg)  09/24/20 133 lb 6.1 oz (60.5 kg)  07/18/20 133 lb 6.4 oz (60.5 kg)  10/08/19 158 lb 11.7 oz (72 kg)  08/22/19 158 lb (71.7 kg)  08/13/19 158 lb (71.7 kg)  05/13/19 155 lb (70.3 kg)  01/31/19  154 lb 5.2 oz (70 kg)  01/27/19 154 lb 5.2 oz (70 kg)  09/11/18 147 lb 11.3 oz (67 kg)  09/09/18 151 lb 3.2 oz (68.6 kg)  06/05/18 107 lb 2.3 oz (48.6 kg)  05/08/18 107 lb 2.3 oz (48.6 kg)  03/27/18 107 lb 3.2 oz (48.6 kg)  02/17/18 146 lb 9.7 oz (66.5 kg)  12/31/17 150 lb (68 kg)  11/14/17 160 lb (72.6 kg)  11/01/17 168 lb (76.2 kg)  07/25/17 144 lb 3.2 oz (65.4 kg)  04/23/17 137 lb 12.8 oz (62.5 kg)  03/27/17 134 lb (60.8 kg)  03/02/17 132 lb (59.9 kg)  01/18/17 128 lb (58.1 kg)  12/05/16 132 lb 4.4 oz (60 kg)  10/11/16 127 lb (57.6 kg)  10/06/16 143 lb (64.9 kg)  08/30/16 143 lb 6.4 oz (65 kg)  08/16/16 159 lb (72.1 kg)  08/04/16 159 lb 6.3 oz (72.3 kg)  06/12/16 173 lb 3.2 oz (78.6 kg)  03/17/16 161 lb (73 kg)  03/10/16 162 lb 9.6 oz (73.8 kg)  01/13/16 159 lb (72.1 kg)  09/13/15 159 lb 6.4 oz (72.3 kg)  06/30/15 160 lb (72.6 kg)  06/14/15 155 lb (70.3 kg)  06/01/15 155 lb 3.2 oz (70.4 kg)  05/20/15 156 lb 15.5 oz (71.2 kg)     CURRENT PROBLEM LIST:  Patient Active Problem List   Diagnosis Date Noted   Pain 02/24/2022   Bilateral pleural effusion 02/22/2022   Malnutrition of moderate degree 02/21/2022   Dementia without behavioral disturbance ([place]) 02/20/2022   Hypokalemia 02/20/2022   Enterocolitis 02/18/2022   Hypothyroidism 02/18/2022   Elevated troponin 02/18/2022   Uremia 07/14/2020   Hyperkalemia 07/14/2020   Hypoglycemia    Elevated liver enzymes 06/14/2020   Other constipation 06/13/2020   AVF (arteriovenous fistula) (HCC)    MSSA (methicillin susceptible Staphylococcus aureus) infection    Nausea & vomiting 02/11/2018   Insomnia 01/18/2017   Altered mental status    Goals of care, counseling/discussion    Dysphagia 12/04/2016   UTI (urinary tract infection) 12/04/2016   Severe sepsis ([place]) 12/02/2016   Hyperglycemia 12/02/2016   Acute encephalopathy 07/28/2016   CAP (community acquired pneumonia) 07/28/2016   Pneumonia 07/28/2016   ESRD (end  stage renal disease) ([place])    Phantom pain ([place]) 09/13/2015   Hyperlipidemia 06/01/2015   Toe amputation status    Insulin-requiring or dependent type II diabetes mellitus ([place])    Chronic kidney disease (CKD) stage G4/A1, severely decreased glomerular filtration rate (GFR) between 15-29 mL/min/1.73 square meter and albuminuria creatinine ratio less than 30 mg/g ([place]) 05/18/2015   Type 2 diabetes mellitus with chronic kidney disease on chronic dialysis, with long-term current use of insulin ([place]) 05/18/2015   Osteomyelitis ([place]) 05/18/2015   Generalized abdominal pain 05/18/2015   Osteomyelitis of left foot ([place]) 05/18/2015   Diabetic neuropathy ([place]) 01/14/2007   Diabetes mellitus without complication ([place]) 95/28/4132   HLD (hyperlipidemia) 01/14/2007   Normocytic anemia 01/14/2007   ANXIETY 01/14/2007   Depression 01/14/2007   Essential hypertension 01/14/2007   PAST MEDICAL HISTORY:  Active Ambulatory Problems    Diagnosis Date Noted   Diabetic neuropathy ([place]) 01/14/2007   Diabetes mellitus without complication ([place]) 44/07/270   HLD (hyperlipidemia) 01/14/2007   Normocytic anemia 01/14/2007   ANXIETY 01/14/2007   Depression 01/14/2007   Essential hypertension 01/14/2007   Chronic kidney disease (CKD) stage G4/A1, severely decreased glomerular filtration rate (GFR) between 15-29 mL/min/1.73 square meter and albuminuria creatinine ratio less than 30 mg/g ([place]) 05/18/2015   Type 2 diabetes mellitus with chronic kidney disease on chronic dialysis, with long-term current use of insulin ([place]) 05/18/2015   Osteomyelitis ([place]) 05/18/2015   Generalized abdominal pain 05/18/2015   Osteomyelitis of left foot ([place]) 05/18/2015   Toe amputation status    Insulin-requiring or dependent type II diabetes mellitus ([place])    Hyperlipidemia 06/01/2015   Phantom pain ([place]) 09/13/2015   Acute encephalopathy 07/28/2016   CAP (community acquired pneumonia) 07/28/2016   Pneumonia 07/28/2016   ESRD  (end stage renal disease) ([place])    Severe sepsis ([place]) 12/02/2016   Hyperglycemia 12/02/2016   Dysphagia 12/04/2016   UTI (urinary tract infection) 12/04/2016   Altered mental status    Goals of care, counseling/discussion    Insomnia 01/18/2017   Nausea & vomiting 02/11/2018   AVF (arteriovenous  fistula) (HCC)    MSSA (methicillin susceptible Staphylococcus aureus) infection    Other constipation 06/13/2020   Elevated liver enzymes 06/14/2020   Uremia 07/14/2020   Hyperkalemia 07/14/2020   Hypoglycemia    Enterocolitis 02/18/2022   Hypothyroidism 02/18/2022   Elevated troponin 02/18/2022   Dementia without behavioral disturbance ([place]) 02/20/2022   Hypokalemia 02/20/2022   Malnutrition of moderate degree 02/21/2022   Bilateral pleural effusion 02/22/2022   Pain 02/24/2022   Resolved Ambulatory Problems    Diagnosis Date Noted   No Resolved Ambulatory Problems   Past Medical History:  Diagnosis Date   Diabetes mellitus    GERD (gastroesophageal reflux disease)    Hypertension    Iron deficiency anemia    MRSA infection    Neuropathy    Seizure-like activity ([place])    Sepsis ([place])    Thyroid disease    Wears dentures    SOCIAL HX:  Social History   Tobacco Use   Smoking status: Never   Smokeless tobacco: Never  Substance Use Topics   Alcohol use: Not Currently    Alcohol/week: 0.0 standard drinks of alcohol    Comment: rare- a beer every now and then   FAMILY HX: No family history on file.    ALLERGIES: No Known Allergies    PERTINENT MEDICATIONS:  Outpatient Encounter Medications as of 04/10/2022  Medication Sig   acetaminophen (TYLENOL) 325 MG tablet Take 2 tablets (650 mg total) by mouth every 6 (six) hours as needed for mild pain (or Fever >/= 101).   atorvastatin (LIPITOR) 40 MG tablet TAKE 1 TABLET (40 MG TOTAL) BY MOUTH DAILY. (office visit for refills) (Patient taking differently: Take 40 mg by mouth daily.)   midodrine (PROAMATINE) 10 MG tablet Take  1 tablet (10 mg total) by mouth 3 (three) times daily with meals.   midodrine (PROAMATINE) 10 MG tablet Take 1 tablet (10 mg total) by mouth 3 (three) times daily.   Nutritional Supplements (,FEEDING SUPPLEMENT, PROSOURCE PLUS) liquid Take 30 mLs by mouth 2 (two) times daily between meals.   Nutritional Supplements (FEEDING SUPPLEMENT, NEPRO CARB STEADY,) LIQD Take 237 mLs by mouth See admin instructions. Take 1 can (237 ml) by mouth once daily on Tuesdays, Thursdays & Saturday morning. May take an additional can (237 ml) by mouth if needed for poor appetite.   ondansetron (ZOFRAN) 4 MG tablet Take 1 tablet (4 mg total) by mouth every 6 (six) hours as needed for nausea.   pantoprazole (PROTONIX) 40 MG tablet Take 1 tablet (40 mg total) by mouth daily.   thiamine (VITAMIN B1) 100 MG tablet Take 1 tablet (100 mg total) by mouth daily.   Facility-Administered Encounter Medications as of 04/10/2022  Medication   0.9 %  sodium chloride infusion    Thank you for the opportunity to participate in the care of Sarah Kelley.  The palliative care team will continue to follow. Please call our office at [phone] if we can be of additional assistance.   Hollace Kinnier, DO  COVID-19 PATIENT SCREENING TOOL Asked and negative response unless otherwise noted:  Have you had symptoms of covid, tested positive or been in contact with someone with symptoms/positive test in the past 5-10 days?  NO

## 2022-04-12 ENCOUNTER — Telehealth (HOSPITAL_COMMUNITY): Payer: Self-pay

## 2022-04-12 NOTE — Telephone Encounter (Signed)
Attempted to contact patient to schedule Modified Barium Swallow - voicemail is not set up.

## 2022-04-19 ENCOUNTER — Other Ambulatory Visit (HOSPITAL_COMMUNITY): Payer: Self-pay

## 2022-04-19 DIAGNOSIS — R1310 Dysphagia, unspecified: Secondary | ICD-10-CM

## 2022-04-20 ENCOUNTER — Other Ambulatory Visit: Payer: Self-pay

## 2022-04-20 ENCOUNTER — Other Ambulatory Visit: Payer: Self-pay | Admitting: Family Medicine

## 2022-04-20 NOTE — Telephone Encounter (Signed)
Unable to refill per protocol, Rx expired. Medication was discontinued 03/09/22. Will refuse.  Requested Prescriptions  Pending Prescriptions Disp Refills  . ethyl chloride spray 116 mL 2    Sig: SPRAY 1 SPRAY TO THE SKIN 3 TIMES A WEEK AS NEEDED. SPRAY A SMALL AMOUNT JUST PRIOR TO NEEDLE INSERTION.     Off-Protocol Failed - 04/20/2022  8:35 AM      Failed - Medication not assigned to a protocol, review manually.      Passed - Valid encounter within last 12 months    Recent Outpatient Visits          2 weeks ago Oral phase dysphagia   [facility] Kerin Perna, NP   5 months ago Type 2 diabetes mellitus with chronic kidney disease on chronic dialysis, with long-term current use of insulin ([place])   [place], [place], MD   10 months ago Type 2 diabetes mellitus with chronic kidney disease on chronic dialysis, with long-term current use of insulin ([place])   [place], Neoma Laming B, MD   1 year ago Type 2 diabetes mellitus with chronic kidney disease on chronic dialysis, with long-term current use of insulin ([place])   [place], [name], MD   1 year ago Muscle cramp   [place], MD      Future Appointments            In 3 weeks [name], MD [place]

## 2022-04-24 ENCOUNTER — Encounter: Payer: Self-pay | Admitting: Internal Medicine

## 2022-04-25 ENCOUNTER — Other Ambulatory Visit: Payer: Self-pay

## 2022-04-28 ENCOUNTER — Ambulatory Visit (HOSPITAL_COMMUNITY)
Admission: RE | Admit: 2022-04-28 | Discharge: 2022-04-28 | Disposition: A | Discharge status: Home / Self Care | Payer: Self-pay | Source: Ambulatory Visit | Attending: Family Medicine | Admitting: Family Medicine

## 2022-04-28 DIAGNOSIS — K219 Gastro-esophageal reflux disease without esophagitis: Secondary | ICD-10-CM | POA: Insufficient documentation

## 2022-04-28 DIAGNOSIS — R059 Cough, unspecified: Secondary | ICD-10-CM | POA: Insufficient documentation

## 2022-04-28 DIAGNOSIS — R1312 Dysphagia, oropharyngeal phase: Secondary | ICD-10-CM | POA: Insufficient documentation

## 2022-04-28 DIAGNOSIS — R1311 Dysphagia, oral phase: Secondary | ICD-10-CM

## 2022-04-28 DIAGNOSIS — R1310 Dysphagia, unspecified: Secondary | ICD-10-CM

## 2022-04-28 NOTE — Progress Notes (Signed)
Modified Barium Swallow Progress Note  Patient Details  Name: Sarah Kelley MRN: [id number] Date of Birth: April 19, 1951  Today's Date: 04/28/2022  Modified Barium Swallow completed.  Full report located under Chart Review in the Imaging Section.  Brief recommendations include the following:  Clinical Impression  Pt presents with severe oropharyngeal dysphagia with esophageal component, which places pt at a high risk for aspiration with any consistency. Swallow function is marked by decreased bolus cohesion, delayed swallow initation (pyriforms), intermittent poor hyolaryngeal excursion, reduced pharyngeal stripping and UES opening resulting in silent aspiration of thin liquids (PAS 8), x1 sensed when aspiration was in large quantity (PAS 7). Pt with poor ability to follow commands for compensatory strategies, however even with slight chin tuck, sips of NTL via straw were without aspiration. Of concern is the amount of vallecular and pyriform residuals (moderate), which are intermittently aspirated in trace-mild amounts (sensed, PAS 7) during intake, especially with thicker consistencies. Pt did not follow cues for secondary dry swallows to clear residuals and did not perform volitional cough to assist in clearance of trace amounts of barium from entering airway. Pt did not accept regular textured solid this date. Small amounts of puree followed by NTL via chin tuck (min-mod visual/tactile cues required) were without aspiration and residuals cleared. This appears to be safest diet at this time, although given severity of deficits and hx of esophgeal dysmotility, risk for aspiration remains high. Discussed diet recommendations with son and continued risk for aspiration. Educated regarding swallow/aspiration precautions including: small bites/sips, slow rate, chin tuck with NTL via straw, encouraging intermittent coughing, daily oral care to prevent oral infection. Study and discussion completed  with assist from in person interpreter throughout. Per chart, palliative care is following and will defer any SLP f/u at home or interventions pending discussions/decisions regarding GOC.   Swallow Evaluation Recommendations       SLP Diet Recommendations: Dysphagia 1 (Puree) solids;Nectar thick liquid   Liquid Administration via: Cup;Straw   Medication Administration: Crushed with puree   Supervision: Full assist for feeding;Full supervision/cueing for compensatory strategies   Compensations: Minimize environmental distractions;Slow rate;Small sips/bites;Clear throat intermittently;Hard cough after swallow;Chin tuck;Use straw to facilitate chin tuck   Postural Changes: Remain semi-upright after after feeds/meals (Comment);Seated upright at 90 degrees   Oral Care Recommendations: Oral care before and after PO   Other Recommendations: Order thickener from [place], [place], [place] Office Number: [place] 04/28/2022,3:09 PM

## 2022-05-01 ENCOUNTER — Other Ambulatory Visit: Payer: Self-pay

## 2022-05-01 ENCOUNTER — Other Ambulatory Visit: Payer: Self-pay | Admitting: Internal Medicine

## 2022-05-01 VITALS — BP 120/50 | HR 76

## 2022-05-01 DIAGNOSIS — F0390 Unspecified dementia without behavioral disturbance: Secondary | ICD-10-CM

## 2022-05-01 DIAGNOSIS — E440 Moderate protein-calorie malnutrition: Secondary | ICD-10-CM

## 2022-05-01 DIAGNOSIS — N186 End stage renal disease: Secondary | ICD-10-CM

## 2022-05-01 DIAGNOSIS — K529 Noninfective gastroenteritis and colitis, unspecified: Secondary | ICD-10-CM

## 2022-05-01 DIAGNOSIS — Z515 Encounter for palliative care: Secondary | ICD-10-CM

## 2022-05-01 DIAGNOSIS — R1312 Dysphagia, oropharyngeal phase: Secondary | ICD-10-CM

## 2022-05-01 MED ORDER — BUDESONIDE 4 MG PO CPDR
4.0000 mg | DELAYED_RELEASE_CAPSULE | Freq: Every day | ORAL | 0 refills | Status: AC
  Filled 2022-05-01: qty 30, fill #0

## 2022-05-01 NOTE — Progress Notes (Signed)
[facility] Palliative Care Follow-Up Visit Telephone: [phone]  Fax: [phone]   Date of encounter: 05/01/22 6:09 PM PATIENT NAME: Sarah Kelley [address] [place] [address]   [phone] (home)  DOB: August 26, 1950 MRN: [id number] PRIMARY CARE PROVIDER:    [name], MD,  [place] [address] [phone]  REFERRING PROVIDER:   [name], MD [address] [place],  [place] [address] [phone]  RESPONSIBLE PARTY:    Contact Information     Name Relation Home Work [place] Son [phone]  [phone]   Hernandez,Maribel Daughter [place]   Salley Slaughter [phone]  [phone]   Levora Angel Daughter [phone]  [phone]        I met face to face with patient and family in her home with her son, [name] and via interpreter phone. Palliative Care was asked to follow this patient by consultation request of  [name], MD to address advance care planning and complex medical decision making. This is follow-up visit.                                     ASSESSMENT AND PLAN / RECOMMENDATIONS:   Advance Care Planning/Goals of Care: Goals include to maximize quality of life and symptom management. Patient/health care surrogate gave his/her permission to discuss.Our advance care planning conversation included a discussion about:    The value and importance of advance care planning  Experiences with loved ones who have been seriously ill or have died  Exploration of personal, cultural or spiritual beliefs that might influence medical decisions  Exploration of goals of care in the event of a sudden injury or illness  Identification  of a healthcare agent--son, Marcial Review and updating or creation of an  advance directive document . Decision not to resuscitate or to de-escalate disease  focused treatments due to poor prognosis. CODE STATUS:  DNR  Symptom Management/Plan: 1. ESRD (end stage renal disease) ([place]) -explained that pt is at the end stages of her dementia with her severe dysphagia and risk of aspiration; however, her son reports she still has good days where she is communicative and not choking on everything she drinks/eats -he is not ready for her to stop hemodialysis despite low bps and her overall poor prognosis   2. Dementia without behavioral disturbance ([facility]) -appears quite advanced--uses manual wheelchair, requires help with all ADLs, minimally verbal today, aspirating on everything per MBS just done and visibly choking and coughing with a smoothie her son prepared   3. Oropharyngeal dysphagia -reviewed results of MBS and recommendations from speech pathology in report--advised to follow the pureed diet with nectar thickened liquids; however, she likes cookies and she's been eating those sometimes without difficulty   4. Malnutrition of moderate degree -has lost a tremendous amount of weight -looks like she's lost 15 lbs since august (from weights in epic) -intake poor and she's coughing and choking on most items  5. Palliative care by specialist -recommended hospice care for her, but her son is not ready to stop her dialysis even with my recommendations b/c "she still has good days." -goal is for her to stay at home through end of life--her son does not want her to be in a SNF--explained we could offer hospice at home -he is requesting help at home--?PCS services eligible  Colitis -  will prescribe a month's worth of budesonide to see if it helps abdominal pain as son reports she has inflammation of the colon  Follow up Palliative Care Visit: Palliative care will continue to follow for complex medical decision making, advance care planning, and clarification of goals. Return 2-4 weeks or prn.   This visit was coded based on medical decision making  (MDM). 40 mins spent on ACP.    PPS: 40%  HOSPICE ELIGIBILITY/DIAGNOSIS: TBD  Chief Complaint: Follow-up palliative visit  HISTORY OF PRESENT ILLNESS:  Sarah Kelley is a 71 y.o. year old female  with ESRD on HD, dysphagia, hypothyroidism, failure to thrive seen in palliative care f/u.  Pt's PCP had just ordered a repeat modified barium swallow which was done on 10/27 and showed severe aspiration risk with a mix of oropharyngeal and esophageal dysphagia.  During the test, "small amounts of puree followed by NTL via chin tuck were without aspiration and residuals cleared. This appears to be safest diet at this time, although given severity of deficits and hx of esophgeal dysmotility, risk for aspiration remains high. Discussed diet recommendations with son and continued risk for aspiration. Educated regarding swallow/aspiration precautions including: small bites/sips, slow rate, chin tuck with NTL via straw, encouraging intermittent coughing, daily oral care to pervent oral infection."  ST eval at home was deferred pending our f/u with pt.  She remains on HD t/r/sat.    When I arrived, her son had been giving her a smoothie and she was coughing and choking and drooling the entire visit.  Her sats would drop into 60s and back up to 90s when she'd clear her secretions.    History obtained from review of EMR, discussion with primary team, and interview with family, facility staff/caregiver and/or Ms. Gutierrez-Gonzalez.   I reviewed available labs, medications, imaging, studies and related documents from the EMR.  Records reviewed and summarized above.   ROS Review of Systems see hpi  Physical Exam: Vitals:   05/01/22 1808  BP: (!) 120/50  Pulse: 76  SpO2: 92%   There is no height or weight on file to calculate BMI. Wt Readings from Last 500 Encounters:  03/17/22 110 lb (49.9 kg)  03/09/22 110 lb 14.3 oz (50.3 kg)  12/26/21 132 lb 4.4 oz (60 kg)  07/22/21 149 lb 14.6 oz (68  kg)  06/17/21 149 lb 14.6 oz (68 kg)  02/28/21 132 lb 4.4 oz (60 kg)  12/29/20 140 lb (63.5 kg)  10/08/20 133 lb 6.1 oz (60.5 kg)  09/24/20 133 lb 6.1 oz (60.5 kg)  07/18/20 133 lb 6.4 oz (60.5 kg)  10/08/19 158 lb 11.7 oz (72 kg)  08/22/19 158 lb (71.7 kg)  08/13/19 158 lb (71.7 kg)  05/13/19 155 lb (70.3 kg)  01/31/19 154 lb 5.2 oz (70 kg)  01/27/19 154 lb 5.2 oz (70 kg)  09/11/18 147 lb 11.3 oz (67 kg)  09/09/18 151 lb 3.2 oz (68.6 kg)  06/05/18 107 lb 2.3 oz (48.6 kg)  05/08/18 107 lb 2.3 oz (48.6 kg)  03/27/18 107 lb 3.2 oz (48.6 kg)  02/17/18 146 lb 9.7 oz (66.5 kg)  12/31/17 150 lb (68 kg)  11/14/17 160 lb (72.6 kg)  11/01/17 168 lb (76.2 kg)  07/25/17 144 lb 3.2 oz (65.4 kg)  04/23/17 137 lb 12.8 oz (62.5 kg)  03/27/17 134 lb (60.8 kg)  03/02/17 132 lb (59.9 kg)  01/18/17 128 lb (58.1 kg)  12/05/16 132 lb 4.4 oz (60 kg)  10/11/16  127 lb (57.6 kg)  10/06/16 143 lb (64.9 kg)  08/30/16 143 lb 6.4 oz (65 kg)  08/16/16 159 lb (72.1 kg)  08/04/16 159 lb 6.3 oz (72.3 kg)  06/12/16 173 lb 3.2 oz (78.6 kg)  03/17/16 161 lb (73 kg)  03/10/16 162 lb 9.6 oz (73.8 kg)  01/13/16 159 lb (72.1 kg)  09/13/15 159 lb 6.4 oz (72.3 kg)  06/30/15 160 lb (72.6 kg)  06/14/15 155 lb (70.3 kg)  06/01/15 155 lb 3.2 oz (70.4 kg)  05/20/15 156 lb 15.5 oz (71.2 kg)   Physical Exam Constitutional:      Appearance: She is ill-appearing.     Comments: Frail and cachectic, head tilting down  HENT:     Mouth/Throat:     Comments: drooling Eyes:     Conjunctiva/sclera: Conjunctivae normal.     Pupils: Pupils are equal, round, and reactive to light.  Cardiovascular:     Rate and Rhythm: Normal rate and regular rhythm.  Pulmonary:     Comments: Upper airway rhonchi Abdominal:     General: Bowel sounds are normal.     Palpations: Abdomen is soft. There is no mass.     Tenderness: There is abdominal tenderness.  Musculoskeletal:        General: Normal range of motion.     Right lower  leg: No edema.     Left lower leg: No edema.  Skin:    General: Skin is warm and dry.  Neurological:     Mental Status: She is alert.     Motor: Weakness present.     CURRENT PROBLEM LIST:  Patient Active Problem List   Diagnosis Date Noted   Pain 02/24/2022   Bilateral pleural effusion 02/22/2022   Malnutrition of moderate degree 02/21/2022   Dementia without behavioral disturbance ([place]) 02/20/2022   Hypokalemia 02/20/2022   Enterocolitis 02/18/2022   Hypothyroidism 02/18/2022   Elevated troponin 02/18/2022   Uremia 07/14/2020   Hyperkalemia 07/14/2020   Hypoglycemia    Elevated liver enzymes 06/14/2020   Other constipation 06/13/2020   AVF (arteriovenous fistula) ([facility])    MSSA (methicillin susceptible Staphylococcus aureus) infection    Nausea & vomiting 02/11/2018   Insomnia 01/18/2017   Altered mental status    Goals of care, counseling/discussion    Dysphagia 12/04/2016   UTI (urinary tract infection) 12/04/2016   Severe sepsis ([place]) 12/02/2016   Hyperglycemia 12/02/2016   Acute encephalopathy 07/28/2016   CAP (community acquired pneumonia) 07/28/2016   Pneumonia 07/28/2016   ESRD (end stage renal disease) ([place])    Phantom pain ([place]) 09/13/2015   Hyperlipidemia 06/01/2015   Toe amputation status    Insulin-requiring or dependent type II diabetes mellitus ([place])    Chronic kidney disease (CKD) stage G4/A1, severely decreased glomerular filtration rate (GFR) between 15-29 mL/min/1.73 square meter and albuminuria creatinine ratio less than 30 mg/g ([place]) 05/18/2015   Type 2 diabetes mellitus with chronic kidney disease on chronic dialysis, with long-term current use of insulin ([place]) 05/18/2015   Osteomyelitis ([place]) 05/18/2015   Generalized abdominal pain 05/18/2015   Osteomyelitis of left foot ([place]) 05/18/2015   Diabetic neuropathy ([place]) 01/14/2007   Diabetes mellitus without complication ([place]) 38/18/2993   HLD (hyperlipidemia) 01/14/2007   Normocytic anemia  01/14/2007   ANXIETY 01/14/2007   Depression 01/14/2007   Essential hypertension 01/14/2007    PAST MEDICAL HISTORY:  Active Ambulatory Problems    Diagnosis Date Noted   Diabetic neuropathy ([place]) 01/14/2007   Diabetes mellitus without  complication ([place]) 62/83/1517   HLD (hyperlipidemia) 01/14/2007   Normocytic anemia 01/14/2007   ANXIETY 01/14/2007   Depression 01/14/2007   Essential hypertension 01/14/2007   Chronic kidney disease (CKD) stage G4/A1, severely decreased glomerular filtration rate (GFR) between 15-29 mL/min/1.73 square meter and albuminuria creatinine ratio less than 30 mg/g ([place]) 05/18/2015   Type 2 diabetes mellitus with chronic kidney disease on chronic dialysis, with long-term current use of insulin ([place]) 05/18/2015   Osteomyelitis ([place]) 05/18/2015   Generalized abdominal pain 05/18/2015   Osteomyelitis of left foot ([place]) 05/18/2015   Toe amputation status    Insulin-requiring or dependent type II diabetes mellitus ([place])    Hyperlipidemia 06/01/2015   Phantom pain ([place]) 09/13/2015   Acute encephalopathy 07/28/2016   CAP (community acquired pneumonia) 07/28/2016   Pneumonia 07/28/2016   ESRD (end stage renal disease) ([place])    Severe sepsis ([place]) 12/02/2016   Hyperglycemia 12/02/2016   Dysphagia 12/04/2016   UTI (urinary tract infection) 12/04/2016   Altered mental status    Goals of care, counseling/discussion    Insomnia 01/18/2017   Nausea & vomiting 02/11/2018   AVF (arteriovenous fistula) (H[facility])    MSSA (methicillin susceptible Staphylococcus aureus) infection    Other constipation 06/13/2020   Elevated liver enzymes 06/14/2020   Uremia 07/14/2020   Hyperkalemia 07/14/2020   Hypoglycemia    Enterocolitis 02/18/2022   Hypothyroidism 02/18/2022   Elevated troponin 02/18/2022   Dementia without behavioral disturbance ([place]) 02/20/2022   Hypokalemia 02/20/2022   Malnutrition of moderate degree 02/21/2022   Bilateral pleural effusion 02/22/2022    Pain 02/24/2022   Resolved Ambulatory Problems    Diagnosis Date Noted   No Resolved Ambulatory Problems   Past Medical History:  Diagnosis Date   Diabetes mellitus    GERD (gastroesophageal reflux disease)    Hypertension    Iron deficiency anemia    MRSA infection    Neuropathy    Seizure-like activity ([place])    Sepsis ([place])    Thyroid disease    Wears dentures     SOCIAL HX:  Social History   Tobacco Use   Smoking status: Never   Smokeless tobacco: Never  Substance Use Topics   Alcohol use: Not Currently    Alcohol/week: 0.0 standard drinks of alcohol    Comment: rare- a beer every now and then     ALLERGIES: No Known Allergies    PERTINENT MEDICATIONS:  Outpatient Encounter Medications as of 05/01/2022  Medication Sig   Budesonide 4 MG CPDR Take 4 mg by mouth daily.   acetaminophen (TYLENOL) 325 MG tablet Take 2 tablets (650 mg total) by mouth every 6 (six) hours as needed for mild pain (or Fever >/= 101).   atorvastatin (LIPITOR) 40 MG tablet TAKE 1 TABLET (40 MG TOTAL) BY MOUTH DAILY. (office visit for refills) (Patient taking differently: Take 40 mg by mouth daily.)   midodrine (PROAMATINE) 10 MG tablet Take 1 tablet (10 mg total) by mouth 3 (three) times daily with meals.   midodrine (PROAMATINE) 10 MG tablet Take 1 tablet (10 mg total) by mouth 3 (three) times daily.   Nutritional Supplements (,FEEDING SUPPLEMENT, PROSOURCE PLUS) liquid Take 30 mLs by mouth 2 (two) times daily between meals.   Nutritional Supplements (FEEDING SUPPLEMENT, NEPRO CARB STEADY,) LIQD Take 237 mLs by mouth See admin instructions. Take 1 can (237 ml) by mouth once daily on Tuesdays, Thursdays & Saturday morning. May take an additional can (237 ml) by mouth if needed for poor  appetite.   ondansetron (ZOFRAN) 4 MG tablet Take 1 tablet (4 mg total) by mouth every 6 (six) hours as needed for nausea.   pantoprazole (PROTONIX) 40 MG tablet Take 1 tablet (40 mg total) by mouth daily.    thiamine (VITAMIN B1) 100 MG tablet Take 1 tablet (100 mg total) by mouth daily.   Facility-Administered Encounter Medications as of 05/01/2022  Medication   0.9 %  sodium chloride infusion    Thank you for the opportunity to participate in the care of Ms. Gutierrez-Gonzalez.  The palliative care team will continue to follow. Please call our office at [phone] if we can be of additional assistance.   Hollace Kinnier, DO  COVID-19 PATIENT SCREENING TOOL Asked and negative response unless otherwise noted:  Have you had symptoms of covid, tested positive or been in contact with someone with symptoms/positive test in the past 5-10 days? no

## 2022-05-01 NOTE — Progress Notes (Signed)
This encounter was created in error - please disregard.

## 2022-05-02 ENCOUNTER — Encounter: Payer: Self-pay | Admitting: Internal Medicine

## 2022-05-17 ENCOUNTER — Ambulatory Visit: Payer: Self-pay | Attending: Family Medicine | Admitting: Family Medicine

## 2022-05-17 ENCOUNTER — Other Ambulatory Visit: Payer: Self-pay | Admitting: Pharmacist

## 2022-05-17 ENCOUNTER — Telehealth: Payer: Self-pay

## 2022-05-17 ENCOUNTER — Encounter: Payer: Self-pay | Admitting: Family Medicine

## 2022-05-17 ENCOUNTER — Other Ambulatory Visit: Payer: Self-pay

## 2022-05-17 VITALS — BP 111/56 | HR 57

## 2022-05-17 DIAGNOSIS — R1312 Dysphagia, oropharyngeal phase: Secondary | ICD-10-CM

## 2022-05-17 DIAGNOSIS — N186 End stage renal disease: Secondary | ICD-10-CM

## 2022-05-17 DIAGNOSIS — Z794 Long term (current) use of insulin: Secondary | ICD-10-CM

## 2022-05-17 DIAGNOSIS — F0390 Unspecified dementia without behavioral disturbance: Secondary | ICD-10-CM

## 2022-05-17 DIAGNOSIS — I959 Hypotension, unspecified: Secondary | ICD-10-CM

## 2022-05-17 DIAGNOSIS — Z7189 Other specified counseling: Secondary | ICD-10-CM

## 2022-05-17 DIAGNOSIS — Z992 Dependence on renal dialysis: Secondary | ICD-10-CM

## 2022-05-17 DIAGNOSIS — E43 Unspecified severe protein-calorie malnutrition: Secondary | ICD-10-CM

## 2022-05-17 DIAGNOSIS — E1122 Type 2 diabetes mellitus with diabetic chronic kidney disease: Secondary | ICD-10-CM

## 2022-05-17 LAB — POCT GLYCOSYLATED HEMOGLOBIN (HGB A1C): HbA1c, POC (controlled diabetic range): 9.7 % — AB (ref 0.0–7.0)

## 2022-05-17 MED ORDER — LANTUS SOLOSTAR 100 UNIT/ML ~~LOC~~ SOPN
2.0000 [IU] | PEN_INJECTOR | Freq: Every day | SUBCUTANEOUS | 1 refills | Status: DC
  Filled 2022-05-17: qty 15, fill #0
  Filled 2022-05-17: qty 3, 28d supply, fill #0

## 2022-05-17 MED ORDER — INSULIN GLARGINE 100 UNIT/ML ~~LOC~~ SOLN
2.0000 [IU] | Freq: Every day | SUBCUTANEOUS | 2 refills | Status: AC
  Filled 2022-05-17: qty 10, 28d supply, fill #0

## 2022-05-17 MED ORDER — PANTOPRAZOLE SODIUM 40 MG PO TBEC
40.0000 mg | DELAYED_RELEASE_TABLET | Freq: Every day | ORAL | 0 refills | Status: AC
Start: 2022-05-17 — End: ?
  Filled 2022-05-17: qty 30, 30d supply, fill #0

## 2022-05-17 NOTE — Telephone Encounter (Signed)
At the request of Dr Margarita Rana, I met with patient and her son, [name].  Spanish interpreter [id number] assisted.   I explained to him what Advance Directives are and provided him with the documents and written instructions in Spanish.  He was very Patent attorney. I told him that he can call this office with any questions and he said he would. He also said he understood that when completed, the document needs to be notarized and a copy of the document should be returned to this office so it can be scanned into her medical record.

## 2022-05-17 NOTE — Progress Notes (Signed)
Subjective:  Patient ID: Sarah Kelley, female    DOB: 1951/04/15  Age: 71 y.o. MRN: [id number]  CC: Diabetes   HPI Sarah Kelley is a 71 y.o. 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 Accompanied by son [name]  to today's visit and she is seen with aid of a Spanish interpreter.  History is mainly provided by the son.  Interval History:  Son states she does not want to eat but will drink smoothies. She does not Complain of pain in her throat but does have difficulty swallowing. She has lost a lot of weight (about 22 pounds between 12/2021 and 03/2022) as a result.  Also complains of intermittent abdominal pain which is absent at the moment. Barium swallow from 04/2022 reveals she has a severe aspiration risk with recommendation for pured diet, nectar thickened liquids. Palliative care is following the patient and per notes hospice was recommended which the son is not agreeable to due to the need to stop dialysis with hospice.  Her BP is low and med list reveals she should be on Midodrine but in the past her son had complained she was unable to tolerate it.  He reports that she is doing well on midodrine. She is not on any medication for her Diabetes.and blood sugars at home have been 289. Past Medical History:  Diagnosis Date   Dementia without behavioral disturbance ([place]) 02/20/2022   Diabetes mellitus    ESRD (end stage renal disease) ([place])    dialysis T/Th/Sa   GERD (gastroesophageal reflux disease)    Hyperlipidemia    Hypertension    Iron deficiency anemia    MRSA infection    hx of   Neuropathy    Diabetic   Pneumonia 07/2016   Seizure-like activity ([place])    Sepsis ([place])    Thyroid disease    Wears dentures     Past Surgical History:  Procedure Laterality Date    AMPUTATION Left 05/21/2015   Procedure: Left Foot 3rd Ray Amputation;  Surgeon: Newt Minion, MD;  Location: [facility];  Service: Orthopedics;  Laterality: Left;   AV FISTULA PLACEMENT Left 03/28/2016   Procedure: ARTERIOVENOUS (AV) FISTULA CREATION LEFT ARM;  Surgeon: Waynetta Sandy, MD;  Location: [facility];  Service: Vascular;  Laterality: Left;   AV FISTULA PLACEMENT Right 06/04/2019   Procedure: ARTERIOVENOUS (AV) FISTULA CREATION RIGHT ARM;  Surgeon: Waynetta Sandy, MD;  Location: [facility];  Service: Vascular;  Laterality: Right;   AV FISTULA PLACEMENT Left 03/11/2021   Procedure: INSERTION OF LEFT ARM ARTERIOVENOUS (AV) GORE-TEX GRAFT;  Surgeon: Waynetta Sandy, MD;  Location: [facility];  Service: Vascular;  Laterality: Left;   DILATION AND CURETTAGE OF UTERUS     EYE SURGERY     FISTULA SUPERFICIALIZATION Left 09/04/2016   Procedure: FISTULA SUPERFICIALIZATION LEFT UPPER ARM;  Surgeon: Waynetta Sandy, MD;  Location: [facility];  Service: Vascular;  Laterality: Left;   FISTULA SUPERFICIALIZATION Right 10/08/2019   Procedure: FISTULA SUPERFICIALIZATION OR RIGHT ARM;  Surgeon: Waynetta Sandy, MD;  Location: [facility];  Service: Vascular;  Laterality: Right;   IR AV DIALY SHUNT INTRO NEEDLE/INTRAC INITIAL W/PTA/STENT/IMG LT Left 06/13/2017   IR AV DIALY SHUNT INTRO NEEDLE/INTRAC INITIAL W/PTA/STENT/IMG LT Left 09/12/2017   IR AV DIALY SHUNT INTRO NEEDLE/INTRACATH INITIAL W/PTA/IMG LEFT  11/10/2016   IR AV  DIALY SHUNT INTRO NEEDLE/INTRACATH INITIAL W/PTA/IMG LEFT  03/09/2017   IR AV DIALY SHUNT INTRO NEEDLE/INTRACATH INITIAL W/PTA/IMG LEFT  12/12/2017   IR AV DIALY SHUNT INTRO NEEDLE/INTRACATH INITIAL W/PTA/IMG LEFT  02/27/2018   IR AV DIALY SHUNT INTRO NEEDLE/INTRACATH INITIAL W/PTA/IMG LEFT  06/05/2018   IR AV DIALY SHUNT INTRO NEEDLE/INTRACATH INITIAL W/PTA/IMG LEFT  09/11/2018   IR AV DIALY SHUNT INTRO NEEDLE/INTRACATH INITIAL W/PTA/IMG LEFT  12/26/2021   IR AV DIALY SHUNT  INTRO NEEDLE/INTRACATH INITIAL W/PTA/IMG RIGHT Right 04/21/2020   IR AV DIALY SHUNT INTRO NEEDLE/INTRACATH INITIAL W/PTA/IMG RIGHT Right 10/08/2020   IR DIALY SHUNT INTRO NEEDLE/INTRACATH INITIAL W/IMG LEFT Left 05/08/2018   IR DIALY SHUNT INTRO NEEDLE/INTRACATH INITIAL W/IMG LEFT Left 06/17/2021   IR FLUORO GUIDE CV LINE RIGHT  02/01/2019   IR FLUORO GUIDE CV LINE RIGHT  07/14/2020   IR FLUORO GUIDE CV LINE RIGHT  12/29/2020   IR GENERIC HISTORICAL  08/01/2016   IR FLUORO GUIDE CV LINE RIGHT 08/01/2016 [name], MD [facility]   IR GENERIC HISTORICAL  08/01/2016   IR US GUIDE VASC ACCESS RIGHT 08/01/2016 [name], MD [facility]   IR REMOVAL TUN CV CATH W/O FL  11/01/2016   IR REMOVAL TUN CV CATH W/O FL  01/02/2020   IR REMOVAL TUN CV CATH W/O FL  05/04/2021   IR THROMBECTOMY AV FISTULA W/THROMBOLYSIS INC/SHUNT/IMG LEFT Left 01/31/2019   IR THROMBECTOMY AV FISTULA W/THROMBOLYSIS/PTA INC/SHUNT/IMG LEFT Left 01/27/2019   IR THROMBECTOMY AV FISTULA W/THROMBOLYSIS/PTA INC/SHUNT/IMG LEFT Left 07/16/2020   IR THROMBECTOMY AV FISTULA W/THROMBOLYSIS/PTA/STENT INC/SHUNT/IMG RT Right 09/24/2020   IR US GUIDE VASC ACCESS LEFT  11/10/2016   IR US GUIDE VASC ACCESS LEFT  09/12/2017   IR US GUIDE VASC ACCESS LEFT  12/12/2017   IR US GUIDE VASC ACCESS LEFT  06/05/2018   IR US GUIDE VASC ACCESS LEFT  09/11/2018   IR US GUIDE VASC ACCESS LEFT  01/27/2019   IR US GUIDE VASC ACCESS LEFT  02/01/2019   IR US GUIDE VASC ACCESS LEFT  12/26/2021   IR US GUIDE VASC ACCESS RIGHT  02/01/2019   IR US GUIDE VASC ACCESS RIGHT  07/14/2020   IR US GUIDE VASC ACCESS RIGHT  07/16/2020   IR US GUIDE VASC ACCESS RIGHT  10/08/2020   IR US GUIDE VASC ACCESS RIGHT  12/29/2020   PERIPHERAL VASCULAR BALLOON ANGIOPLASTY Left 07/22/2021   Procedure: PERIPHERAL VASCULAR BALLOON ANGIOPLASTY;  Surgeon: Cherre Robins, MD;  Location: [facility];  Service: Cardiovascular;  Laterality: Left;   UPPER EXTREMITY VENOGRAPHY Bilateral 02/28/2021    Procedure: UPPER EXTREMITY VENOGRAPHY;  Surgeon: Waynetta Sandy, MD;  Location: [facility];  Service: Cardiovascular;  Laterality: Bilateral;    No family history on file.  Social History   Socioeconomic History   Marital status: Widowed    Spouse name: Not on file   Number of children: 11   Years of education: no schooling   Highest education level: Not on file  Occupational History   Not on file  Tobacco Use   Smoking status: Never   Smokeless tobacco: Never  Vaping Use   Vaping Use: Never used  Substance and Sexual Activity   Alcohol use: Not Currently    Alcohol/week: 0.0 standard drinks of alcohol    Comment: rare- a beer every now and then   Drug use: No   Sexual activity: Not Currently  Other Topics Concern   Not on file  Social History Narrative   Lives with son Marcial    Right handed   Caffeine: every once in a while a little coffee, chamomile tea, or soda   Social Determinants of Health   Financial Resource Strain: Not on file  Food Insecurity: Not on file  Transportation Needs: Not on file  Physical Activity: Not on file  Stress: Not on file  Social Connections: Not on file    No Known Allergies  Outpatient Medications Prior to Visit  Medication Sig Dispense Refill   acetaminophen (TYLENOL) 325 MG tablet Take 2 tablets (650 mg total) by mouth every 6 (six) hours as needed for mild pain (or Fever >/= 101).     atorvastatin (LIPITOR) 40 MG tablet TAKE 1 TABLET (40 MG TOTAL) BY MOUTH DAILY. (office visit for refills) (Patient taking differently: Take 40 mg by mouth daily.) 30 tablet 6   Budesonide 4 MG CPDR Take 4 mg by mouth daily. 30 capsule 0   midodrine (PROAMATINE) 10 MG tablet Take 1 tablet (10 mg total) by mouth 3 (three) times daily with meals. 21 tablet 0   midodrine (PROAMATINE) 10 MG tablet Take 1 tablet (10 mg total) by mouth 3 (three) times daily. 90 tablet 2   Nutritional Supplements (,FEEDING SUPPLEMENT, PROSOURCE PLUS)  liquid Take 30 mLs by mouth 2 (two) times daily between meals. 887 mL 0   Nutritional Supplements (FEEDING SUPPLEMENT, NEPRO CARB STEADY,) LIQD Take 237 mLs by mouth See admin instructions. Take 1 can (237 ml) by mouth once daily on Tuesdays, Thursdays & Saturday morning. May take an additional can (237 ml) by mouth if needed for poor appetite.     ondansetron (ZOFRAN) 4 MG tablet Take 1 tablet (4 mg total) by mouth every 6 (six) hours as needed for nausea. 20 tablet 0   pantoprazole (PROTONIX) 40 MG tablet Take 1 tablet (40 mg total) by mouth daily. 30 tablet 0   thiamine (VITAMIN B1) 100 MG tablet Take 1 tablet (100 mg total) by mouth daily. 30 tablet 0   Facility-Administered Medications Prior to Visit  Medication Dose Route Frequency Provider Last Rate Last Admin   0.9 %  sodium chloride infusion   Intravenous Continuous Monia Sabal, PA-C         ROS Review of Systems  Constitutional:  Negative for activity change, appetite change and fatigue.  HENT:  Negative for congestion, sinus pressure and sore throat.   Eyes:  Negative for visual disturbance.  Respiratory:  Negative for cough, chest tightness, shortness of breath and wheezing.   Cardiovascular:  Negative for chest pain and palpitations.  Gastrointestinal:  Negative for abdominal distention, abdominal pain and constipation.  Endocrine: Negative for polydipsia.  Genitourinary:  Negative for dysuria and frequency.  Musculoskeletal:  Negative for arthralgias and back pain.  Skin:  Negative for rash.  Neurological:  Negative for tremors, light-headedness and numbness.  Hematological:  Does not bruise/bleed easily.  Psychiatric/Behavioral:  Negative for agitation and behavioral problems.     Objective:  BP (!) 111/56   Pulse (!) 57   LMP  (LMP Unknown)   SpO2 98%      05/17/2022   11:05 AM 05/01/2022    6:08 PM 04/05/2022    9:34 AM  BP/Weight  Systolic BP 357 017 793  Diastolic BP 56 50 57    Wt Readings from Last  3 Encounters:  03/17/22 110 lb (49.9 kg)  03/09/22 110 lb 14.3 oz (50.3 kg)  12/26/21 132 lb 4.4  oz (60 kg)     Physical Exam Constitutional:      Appearance: She is well-developed.     Comments: At baseline, chronically ill looking  Cardiovascular:     Rate and Rhythm: Bradycardia present.     Heart sounds: Normal heart sounds. No murmur heard. Pulmonary:     Effort: Pulmonary effort is normal.     Breath sounds: Normal breath sounds. No wheezing or rales.  Chest:     Chest wall: No tenderness.  Abdominal:     General: Bowel sounds are normal. There is no distension.     Palpations: Abdomen is soft. There is no mass.     Tenderness: There is no abdominal tenderness.  Musculoskeletal:        General: Normal range of motion.     Right lower leg: No edema.     Left lower leg: No edema.  Neurological:     Mental Status: She is oriented to person, place, and time.  Psychiatric:        Mood and Affect: Mood normal.        Latest Ref Rng & Units 03/17/2022    7:36 PM 03/09/2022    1:17 AM 03/08/2022    2:04 AM  CMP  Glucose 70 - 99 mg/dL 295  237  144   BUN 8 - 23 mg/dL 18  26  15    Creatinine 0.44 - 1.00 mg/dL 4.92  7.01  5.29   Sodium 135 - 145 mmol/L 138  133  136   Potassium 3.5 - 5.1 mmol/L 3.6  4.2  4.4   Chloride 98 - 111 mmol/L 95  92  96   CO2 22 - 32 mmol/L 30  27  29    Calcium 8.9 - 10.3 mg/dL 11.3  10.2  9.9   Total Protein 6.5 - 8.1 g/dL 7.4  6.8    Total Bilirubin 0.3 - 1.2 mg/dL 1.3  0.6    Alkaline Phos 38 - 126 U/L 248  132    AST 15 - 41 U/L 34  28    ALT 0 - 44 U/L 27  16      Lipid Panel     Component Value Date/Time   CHOL 195 02/14/2021 1107   TRIG 125 02/14/2021 1107   HDL 49 02/14/2021 1107   CHOLHDL 4.0 02/14/2021 1107   CHOLHDL 4.9 01/13/2016 1051   VLDL 66 (H) 01/13/2016 1051   LDLCALC 124 (H) 02/14/2021 1107    CBC    Component Value Date/Time   WBC 5.0 03/17/2022 1936   RBC 3.83 (L) 03/17/2022 1936   HGB 12.5 03/17/2022 1936    HGB 9.7 (L) 06/11/2020 1013   HCT 39.4 03/17/2022 1936   HCT 29.6 (L) 06/11/2020 1013   PLT 181 03/17/2022 1936   PLT 190 06/11/2020 1013   MCV 102.9 (H) 03/17/2022 1936   MCV 95 06/11/2020 1013   MCH 32.6 03/17/2022 1936   MCHC 31.7 03/17/2022 1936   RDW 16.0 (H) 03/17/2022 1936   RDW 13.4 06/11/2020 1013   LYMPHSABS 0.9 03/17/2022 1936   LYMPHSABS 1.5 06/11/2020 1013   MONOABS 0.6 03/17/2022 1936   EOSABS 0.1 03/17/2022 1936   EOSABS 0.2 06/11/2020 1013   BASOSABS 0.1 03/17/2022 1936   BASOSABS 0.1 06/11/2020 1013    Lab Results  Component Value Date   HGBA1C 9.7 (A) 05/17/2022    Assessment & Plan:  1. Type 2 diabetes mellitus with chronic kidney disease on  chronic dialysis, with long-term current use of insulin (HCC) Uncontrolled with A1c of 9.7 We will initiate Lantus at a very low-dose of 2 units nightly as she currently has a poor appetite.  If her appetite picks up he needs to notify me and we can increase to 4 units nightly - POCT glycosylated hemoglobin (Hb A1C) - insulin glargine (LANTUS SOLOSTAR) 100 UNIT/ML Solostar Pen; Inject 2 Units into the skin at bedtime.  Dispense: 15 mL; Refill: 1  2. Oropharyngeal dysphagia Severe aspiration risk Discussed available options including esophageal dilatation, PEG tube especially in the light of severe weight loss and son would like aggressive measures to improve her appetite She is at high risk of aspiration and needs to continue pured diet with nectar thick liquids Dementia also playing a role in dysphagia - Ambulatory referral to Gastroenterology  3. Severe protein-calorie malnutrition ([place]) Secondary to #2 above She will benefit from a nutrition consult but unfortunately she has no medical coverage for referral - Ambulatory referral to Gastroenterology  4. Hypotension, unspecified hypotension type Stable Continue midodrine as tolerated  5. Goals of care, counseling/discussion Her condition has deteriorated  drastically and we have discussed the goals of care. Provided her son [name] with advanced directive paperwork as we do not have one in place He states he would like aggressive measures especially those which will improve his mom's appetite Palliative care following.  6.  ESRD Continue hemodialysis as per schedule  Health Care Maintenance: Currently under palliative care with limited life expectancy hence will hold off. . Meds ordered this encounter  Medications   insulin glargine (LANTUS SOLOSTAR) 100 UNIT/ML Solostar Pen    Sig: Inject 2 Units into the skin at bedtime.    Dispense:  15 mL    Refill:  1    Follow-up: Return in about 3 months (around 08/17/2022) for Chronic medical conditions.       [name], MD, FAAFP. [facility] [place], [place]   05/17/2022, 12:05 PM

## 2022-05-18 ENCOUNTER — Other Ambulatory Visit: Payer: Self-pay

## 2022-05-19 ENCOUNTER — Other Ambulatory Visit: Payer: Self-pay

## 2022-06-16 ENCOUNTER — Other Ambulatory Visit (HOSPITAL_COMMUNITY): Payer: Self-pay | Admitting: Nephrology

## 2022-06-16 DIAGNOSIS — Z992 Dependence on renal dialysis: Secondary | ICD-10-CM

## 2022-06-16 DIAGNOSIS — N186 End stage renal disease: Secondary | ICD-10-CM

## 2022-06-17 ENCOUNTER — Inpatient Hospital Stay (HOSPITAL_COMMUNITY): Payer: Medicaid Other

## 2022-06-17 ENCOUNTER — Inpatient Hospital Stay (HOSPITAL_COMMUNITY)
Admission: EM | Admit: 2022-06-17 | Discharge: 2022-07-03 | DRG: 312 | Disposition: E | Payer: Medicaid Other | Attending: Internal Medicine | Admitting: Internal Medicine

## 2022-06-17 ENCOUNTER — Emergency Department (HOSPITAL_COMMUNITY): Payer: Medicaid Other

## 2022-06-17 ENCOUNTER — Encounter (HOSPITAL_COMMUNITY): Payer: Self-pay

## 2022-06-17 ENCOUNTER — Other Ambulatory Visit: Payer: Self-pay

## 2022-06-17 DIAGNOSIS — I469 Cardiac arrest, cause unspecified: Secondary | ICD-10-CM

## 2022-06-17 DIAGNOSIS — E1165 Type 2 diabetes mellitus with hyperglycemia: Secondary | ICD-10-CM | POA: Diagnosis present

## 2022-06-17 DIAGNOSIS — A419 Sepsis, unspecified organism: Secondary | ICD-10-CM

## 2022-06-17 DIAGNOSIS — Z515 Encounter for palliative care: Secondary | ICD-10-CM | POA: Diagnosis not present

## 2022-06-17 DIAGNOSIS — I5032 Chronic diastolic (congestive) heart failure: Secondary | ICD-10-CM | POA: Diagnosis present

## 2022-06-17 DIAGNOSIS — K219 Gastro-esophageal reflux disease without esophagitis: Secondary | ICD-10-CM | POA: Diagnosis present

## 2022-06-17 DIAGNOSIS — W19XXXA Unspecified fall, initial encounter: Secondary | ICD-10-CM | POA: Diagnosis present

## 2022-06-17 DIAGNOSIS — Z20822 Contact with and (suspected) exposure to covid-19: Secondary | ICD-10-CM | POA: Diagnosis present

## 2022-06-17 DIAGNOSIS — Z66 Do not resuscitate: Secondary | ICD-10-CM | POA: Diagnosis present

## 2022-06-17 DIAGNOSIS — Y848 Other medical procedures as the cause of abnormal reaction of the patient, or of later complication, without mention of misadventure at the time of the procedure: Secondary | ICD-10-CM | POA: Diagnosis not present

## 2022-06-17 DIAGNOSIS — J9601 Acute respiratory failure with hypoxia: Secondary | ICD-10-CM | POA: Diagnosis present

## 2022-06-17 DIAGNOSIS — J690 Pneumonitis due to inhalation of food and vomit: Secondary | ICD-10-CM | POA: Diagnosis present

## 2022-06-17 DIAGNOSIS — I462 Cardiac arrest due to underlying cardiac condition: Secondary | ICD-10-CM | POA: Diagnosis present

## 2022-06-17 DIAGNOSIS — E1142 Type 2 diabetes mellitus with diabetic polyneuropathy: Secondary | ICD-10-CM | POA: Diagnosis present

## 2022-06-17 DIAGNOSIS — F0390 Unspecified dementia without behavioral disturbance: Secondary | ICD-10-CM | POA: Diagnosis present

## 2022-06-17 DIAGNOSIS — R54 Age-related physical debility: Secondary | ICD-10-CM | POA: Diagnosis present

## 2022-06-17 DIAGNOSIS — Z794 Long term (current) use of insulin: Secondary | ICD-10-CM | POA: Diagnosis not present

## 2022-06-17 DIAGNOSIS — E872 Acidosis, unspecified: Secondary | ICD-10-CM | POA: Diagnosis present

## 2022-06-17 DIAGNOSIS — E079 Disorder of thyroid, unspecified: Secondary | ICD-10-CM | POA: Diagnosis present

## 2022-06-17 DIAGNOSIS — R6521 Severe sepsis with septic shock: Secondary | ICD-10-CM | POA: Diagnosis not present

## 2022-06-17 DIAGNOSIS — T8119XA Other postprocedural shock, initial encounter: Secondary | ICD-10-CM | POA: Diagnosis not present

## 2022-06-17 DIAGNOSIS — I132 Hypertensive heart and chronic kidney disease with heart failure and with stage 5 chronic kidney disease, or end stage renal disease: Secondary | ICD-10-CM | POA: Diagnosis present

## 2022-06-17 DIAGNOSIS — N186 End stage renal disease: Secondary | ICD-10-CM | POA: Diagnosis present

## 2022-06-17 DIAGNOSIS — Z79899 Other long term (current) drug therapy: Secondary | ICD-10-CM

## 2022-06-17 DIAGNOSIS — R4182 Altered mental status, unspecified: Secondary | ICD-10-CM

## 2022-06-17 DIAGNOSIS — Z8701 Personal history of pneumonia (recurrent): Secondary | ICD-10-CM

## 2022-06-17 DIAGNOSIS — I953 Hypotension of hemodialysis: Secondary | ICD-10-CM | POA: Diagnosis present

## 2022-06-17 DIAGNOSIS — I4891 Unspecified atrial fibrillation: Secondary | ICD-10-CM | POA: Diagnosis present

## 2022-06-17 DIAGNOSIS — E1122 Type 2 diabetes mellitus with diabetic chronic kidney disease: Secondary | ICD-10-CM | POA: Diagnosis present

## 2022-06-17 DIAGNOSIS — Z992 Dependence on renal dialysis: Secondary | ICD-10-CM | POA: Diagnosis not present

## 2022-06-17 DIAGNOSIS — Z7952 Long term (current) use of systemic steroids: Secondary | ICD-10-CM

## 2022-06-17 DIAGNOSIS — E785 Hyperlipidemia, unspecified: Secondary | ICD-10-CM | POA: Diagnosis present

## 2022-06-17 DIAGNOSIS — Z8614 Personal history of Methicillin resistant Staphylococcus aureus infection: Secondary | ICD-10-CM

## 2022-06-17 DIAGNOSIS — E1151 Type 2 diabetes mellitus with diabetic peripheral angiopathy without gangrene: Secondary | ICD-10-CM | POA: Diagnosis present

## 2022-06-17 DIAGNOSIS — G931 Anoxic brain damage, not elsewhere classified: Secondary | ICD-10-CM | POA: Diagnosis present

## 2022-06-17 DIAGNOSIS — L8990 Pressure ulcer of unspecified site, unspecified stage: Secondary | ICD-10-CM | POA: Insufficient documentation

## 2022-06-17 DIAGNOSIS — R9431 Abnormal electrocardiogram [ECG] [EKG]: Secondary | ICD-10-CM | POA: Diagnosis not present

## 2022-06-17 LAB — COMPREHENSIVE METABOLIC PANEL
ALT: 33 U/L (ref 0–44)
AST: 107 U/L — ABNORMAL HIGH (ref 15–41)
Albumin: 2.5 g/dL — ABNORMAL LOW (ref 3.5–5.0)
Alkaline Phosphatase: 296 U/L — ABNORMAL HIGH (ref 38–126)
Anion gap: 14 (ref 5–15)
BUN: 13 mg/dL (ref 8–23)
CO2: 22 mmol/L (ref 22–32)
Calcium: 9.5 mg/dL (ref 8.9–10.3)
Chloride: 104 mmol/L (ref 98–111)
Creatinine, Ser: 2.44 mg/dL — ABNORMAL HIGH (ref 0.44–1.00)
GFR, Estimated: 20 mL/min — ABNORMAL LOW (ref >60)
Glucose, Bld: 160 mg/dL — ABNORMAL HIGH (ref 70–99)
Potassium: 4.2 mmol/L (ref 3.5–5.1)
Sodium: 140 mmol/L (ref 135–145)
Total Bilirubin: 1.3 mg/dL — ABNORMAL HIGH (ref 0.3–1.2)
Total Protein: 7 g/dL (ref 6.5–8.1)

## 2022-06-17 LAB — RESPIRATORY PANEL BY PCR

## 2022-06-17 LAB — I-STAT CHEM 8, ED
BUN: 14 mg/dL (ref 8–23)
Calcium, Ion: 1.07 mmol/L — ABNORMAL LOW (ref 1.15–1.40)
Chloride: 105 mmol/L (ref 98–111)
Creatinine, Ser: 2.2 mg/dL — ABNORMAL HIGH (ref 0.44–1.00)
Glucose, Bld: 164 mg/dL — ABNORMAL HIGH (ref 70–99)
HCT: 49 % — ABNORMAL HIGH (ref 36.0–46.0)
Hemoglobin: 16.7 g/dL — ABNORMAL HIGH (ref 12.0–15.0)
Potassium: 4 mmol/L (ref 3.5–5.1)
Sodium: 140 mmol/L (ref 135–145)
TCO2: 24 mmol/L (ref 22–32)

## 2022-06-17 LAB — CBC WITH DIFFERENTIAL/PLATELET
Abs Immature Granulocytes: 0.02 10*3/uL (ref 0.00–0.07)
Basophils Absolute: 0.1 10*3/uL (ref 0.0–0.1)
Basophils Relative: 1 %
Eosinophils Absolute: 0 10*3/uL (ref 0.0–0.5)
Eosinophils Relative: 1 %
HCT: 49.5 % — ABNORMAL HIGH (ref 36.0–46.0)
Hemoglobin: 14.6 g/dL (ref 12.0–15.0)
Immature Granulocytes: 0 %
Lymphocytes Relative: 19 %
Lymphs Abs: 1.1 10*3/uL (ref 0.7–4.0)
MCH: 29.2 pg (ref 26.0–34.0)
MCHC: 29.5 g/dL — ABNORMAL LOW (ref 30.0–36.0)
MCV: 99 fL (ref 80.0–100.0)
Monocytes Absolute: 0.4 10*3/uL (ref 0.1–1.0)
Monocytes Relative: 7 %
Neutro Abs: 4.1 10*3/uL (ref 1.7–7.7)
Neutrophils Relative %: 72 %
Platelets: 67 10*3/uL — ABNORMAL LOW (ref 150–400)
RBC: 5 MIL/uL (ref 3.87–5.11)
RDW: 17.8 % — ABNORMAL HIGH (ref 11.5–15.5)
WBC: 5.7 10*3/uL (ref 4.0–10.5)
nRBC: 0 % (ref 0.0–0.2)

## 2022-06-17 LAB — POCT I-STAT 7, (LYTES, BLD GAS, ICA,H+H)
Acid-base deficit: 4 mmol/L — ABNORMAL HIGH (ref 0.0–2.0)
Bicarbonate: 25.5 mmol/L (ref 20.0–28.0)
Calcium, Ion: 1.33 mmol/L (ref 1.15–1.40)
HCT: 50 % — ABNORMAL HIGH (ref 36.0–46.0)
Hemoglobin: 17 g/dL — ABNORMAL HIGH (ref 12.0–15.0)
O2 Saturation: 94 %
Patient temperature: 36.1
Potassium: 3.7 mmol/L (ref 3.5–5.1)
Sodium: 140 mmol/L (ref 135–145)
TCO2: 27 mmol/L (ref 22–32)
pCO2 arterial: 62.4 mmHg — ABNORMAL HIGH (ref 32–48)
pH, Arterial: 7.214 — ABNORMAL LOW (ref 7.35–7.45)
pO2, Arterial: 82 mmHg — ABNORMAL LOW (ref 83–108)

## 2022-06-17 LAB — LACTIC ACID, PLASMA
Lactic Acid, Venous: 5.2 mmol/L (ref 0.5–1.9)
Lactic Acid, Venous: 5.1 mmol/L (ref 0.5–1.9)

## 2022-06-17 LAB — PROTIME-INR
INR: 1.4 — ABNORMAL HIGH (ref 0.8–1.2)
Prothrombin Time: 16.9 seconds — ABNORMAL HIGH (ref 11.4–15.2)

## 2022-06-17 LAB — MRSA NEXT GEN BY PCR, NASAL: MRSA by PCR Next Gen: NOT DETECTED

## 2022-06-17 LAB — GLUCOSE, CAPILLARY
Glucose-Capillary: 166 mg/dL — ABNORMAL HIGH (ref 70–99)
Glucose-Capillary: 94 mg/dL (ref 70–99)

## 2022-06-17 LAB — TROPONIN I (HIGH SENSITIVITY)
Troponin I (High Sensitivity): 156 ng/L (ref <18)
Troponin I (High Sensitivity): 354 ng/L (ref <18)

## 2022-06-17 LAB — APTT: aPTT: 39 seconds — ABNORMAL HIGH (ref 24–36)

## 2022-06-17 MED ORDER — ACETAMINOPHEN 650 MG RE SUPP: 650.0000 mg | RECTAL | Status: DC

## 2022-06-17 MED ORDER — VASOPRESSIN 20 UNITS/100 ML INFUSION FOR SHOCK
0.0000 [IU]/min | INTRAVENOUS | Status: DC
  Administered 2022-06-18: 0.04 [IU]/min via INTRAVENOUS
  Administered 2022-06-18 (×2): 0.03 [IU]/min via INTRAVENOUS
  Administered 2022-06-19 (×2): 0.04 [IU]/min via INTRAVENOUS
  Filled 2022-06-17 (×5): qty 100

## 2022-06-17 MED ORDER — SODIUM CHLORIDE 0.9% FLUSH
10.0000 mL | Freq: Two times a day (BID) | INTRAVENOUS | Status: DC
  Administered 2022-06-17: 10 mL
  Administered 2022-06-18: 40 mL

## 2022-06-17 MED ORDER — SODIUM CHLORIDE 0.9 % IV SOLN
INTRAVENOUS | Status: DC | PRN
  Administered 2022-06-17: 1000 mL via INTRAVENOUS

## 2022-06-17 MED ORDER — HEPARIN SODIUM (PORCINE) 5000 UNIT/ML IJ SOLN
5000.0000 [IU] | Freq: Three times a day (TID) | INTRAMUSCULAR | Status: DC
  Administered 2022-06-17 – 2022-06-19 (×5): 5000 [IU] via SUBCUTANEOUS
  Filled 2022-06-17 (×5): qty 1

## 2022-06-17 MED ORDER — SODIUM CHLORIDE 0.9% FLUSH: 10.0000 mL | INTRAVENOUS | Status: DC | PRN

## 2022-06-17 MED ORDER — NOREPINEPHRINE 4 MG/250ML-% IV SOLN
INTRAVENOUS | Status: AC
  Administered 2022-06-17: 5 ug/min via INTRAVENOUS
  Filled 2022-06-17: qty 250

## 2022-06-17 MED ORDER — FAMOTIDINE 20 MG PO TABS
10.0000 mg | ORAL_TABLET | Freq: Every day | ORAL | Status: DC
  Administered 2022-06-18: 10 mg
  Filled 2022-06-17: qty 1

## 2022-06-17 MED ORDER — MAGNESIUM SULFATE 2 GM/50ML IV SOLN: 2.0000 g | Freq: Once | INTRAVENOUS | Status: AC | PRN

## 2022-06-17 MED ORDER — ACETAMINOPHEN 160 MG/5ML PO SOLN: 650.0000 mg | ORAL | Status: DC | PRN

## 2022-06-17 MED ORDER — SODIUM BICARBONATE 8.4 % IV SOLN
100.0000 meq | Freq: Once | INTRAVENOUS | Status: AC
  Administered 2022-06-17: 100 meq via INTRAVENOUS

## 2022-06-17 MED ORDER — ACETAMINOPHEN 160 MG/5ML PO SOLN: 650.0000 mg | ORAL | Status: DC

## 2022-06-17 MED ORDER — ACETAMINOPHEN 325 MG PO TABS: 650.0000 mg | ORAL_TABLET | ORAL | Status: DC

## 2022-06-17 MED ORDER — SODIUM CHLORIDE 0.9 % IV SOLN
250.0000 mL | INTRAVENOUS | Status: DC
  Administered 2022-06-17: 250 mL via INTRAVENOUS

## 2022-06-17 MED ORDER — ACETAMINOPHEN 325 MG PO TABS: 650.0000 mg | ORAL_TABLET | ORAL | Status: DC | PRN

## 2022-06-17 MED ORDER — POLYETHYLENE GLYCOL 3350 17 G PO PACK: 17.0000 g | PACK | Freq: Every day | ORAL | Status: DC | PRN

## 2022-06-17 MED ORDER — DOCUSATE SODIUM 50 MG/5ML PO LIQD: 100.0000 mg | Freq: Two times a day (BID) | ORAL | Status: DC | PRN

## 2022-06-17 MED ORDER — SODIUM CHLORIDE 0.9 % IV SOLN: INTRAVENOUS | Status: DC | PRN

## 2022-06-17 MED ORDER — MIDODRINE HCL 5 MG PO TABS: 10.0000 mg | ORAL_TABLET | Freq: Three times a day (TID) | ORAL | Status: DC

## 2022-06-17 MED ORDER — FENTANYL 2500MCG IN NS 250ML (10MCG/ML) PREMIX INFUSION
0.0000 ug/h | INTRAVENOUS | Status: DC
  Administered 2022-06-17: 100 ug/h via INTRAVENOUS
  Administered 2022-06-17: 50 ug/h via INTRAVENOUS
  Administered 2022-06-18: 100 ug/h via INTRAVENOUS
  Administered 2022-06-19: 50 ug/h via INTRAVENOUS
  Filled 2022-06-17 (×3): qty 250

## 2022-06-17 MED ORDER — BUSPIRONE HCL 10 MG PO TABS: 30.0000 mg | ORAL_TABLET | Freq: Three times a day (TID) | ORAL | Status: AC | PRN

## 2022-06-17 MED ORDER — NOREPINEPHRINE 4 MG/250ML-% IV SOLN
2.0000 ug/min | INTRAVENOUS | Status: DC
  Administered 2022-06-17: 14 ug/min via INTRAVENOUS
  Filled 2022-06-17: qty 250

## 2022-06-17 MED ORDER — NOREPINEPHRINE 16 MG/250ML-% IV SOLN
0.0000 ug/min | INTRAVENOUS | Status: DC
  Administered 2022-06-17: 14 ug/min via INTRAVENOUS
  Administered 2022-06-18: 24 ug/min via INTRAVENOUS
  Administered 2022-06-18: 23 ug/min via INTRAVENOUS
  Administered 2022-06-18: 25 ug/min via INTRAVENOUS
  Administered 2022-06-19: 32 ug/min via INTRAVENOUS
  Administered 2022-06-19: 28 ug/min via INTRAVENOUS
  Filled 2022-06-17 (×6): qty 250

## 2022-06-17 MED ORDER — DOCUSATE SODIUM 100 MG PO CAPS: 100.0000 mg | ORAL_CAPSULE | Freq: Two times a day (BID) | ORAL | Status: DC | PRN

## 2022-06-17 MED ORDER — SODIUM CHLORIDE 0.9 % IV SOLN: 250.0000 mL | INTRAVENOUS | Status: DC

## 2022-06-17 MED ORDER — MIDODRINE HCL 5 MG PO TABS
10.0000 mg | ORAL_TABLET | Freq: Three times a day (TID) | ORAL | Status: DC
  Administered 2022-06-18 – 2022-06-19 (×5): 10 mg
  Filled 2022-06-17 (×6): qty 2

## 2022-06-17 MED ORDER — CHLORHEXIDINE GLUCONATE CLOTH 2 % EX PADS
6.0000 | MEDICATED_PAD | Freq: Every day | CUTANEOUS | Status: DC
  Administered 2022-06-18 – 2022-06-19 (×2): 6 via TOPICAL

## 2022-06-17 MED ORDER — INSULIN ASPART 100 UNIT/ML IJ SOLN
0.0000 [IU] | INTRAMUSCULAR | Status: DC
  Administered 2022-06-17: 3 [IU] via SUBCUTANEOUS
  Administered 2022-06-19: 2 [IU] via SUBCUTANEOUS

## 2022-06-17 MED ORDER — SODIUM CHLORIDE 0.9 % IV SOLN
3.0000 g | Freq: Two times a day (BID) | INTRAVENOUS | Status: DC
  Administered 2022-06-17 – 2022-06-19 (×4): 3 g via INTRAVENOUS
  Filled 2022-06-17 (×4): qty 8

## 2022-06-17 MED ORDER — ACETAMINOPHEN 650 MG RE SUPP: 650.0000 mg | RECTAL | Status: DC | PRN

## 2022-06-17 MED ORDER — FENTANYL CITRATE PF 50 MCG/ML IJ SOSY
50.0000 ug | PREFILLED_SYRINGE | Freq: Once | INTRAMUSCULAR | Status: DC
  Filled 2022-06-17: qty 1

## 2022-06-17 NOTE — Progress Notes (Signed)
2 RT's attempted to place an arterial line with no success.  Md made aware.

## 2022-06-17 NOTE — Progress Notes (Signed)
Patient transported to CT and back on vent. No complications noted. Vitals stable.

## 2022-06-17 NOTE — ED Notes (Signed)
X-ray at bedside

## 2022-06-17 NOTE — H&P (Signed)
NAME:  Sarah Kelley, MRN:  [id number], DOB:  06-16-1951, LOS: 0 ADMISSION DATE:  06/30/2022, CONSULTATION DATE: 06/09/2022 REFERRING MD: Post PEA cardiac arrest, CHIEF COMPLAINT:  Trifan, Carola Rhine, MD    History of Present Illness:  71 year old female with hypertension, end-stage renal disease on hemodialysis, diabetes was brought into the emergency department status post PEA cardiac arrest with downtime 3 minutes and CPR time 11 to 15 minutes before ROSC was achieved.  Per EMS report patient completed hemodialysis session today after that she became unresponsive and fell to the ground when EMS was summoned Patient was intubated in the emergency department and placed on mechanical ventilator  She became hypotensive, started on Levophed.  PCCM was consulted for help evaluation medical management  Pertinent  Medical History   Past Medical History:  Diagnosis Date   Dementia without behavioral disturbance ([place]) 02/20/2022   Diabetes mellitus    ESRD (end stage renal disease) ([place])    dialysis T/Th/Sa   GERD (gastroesophageal reflux disease)    Hyperlipidemia    Hypertension    Iron deficiency anemia    MRSA infection    hx of   Neuropathy    Diabetic   Pneumonia 07/2016   Seizure-like activity ([place])    Sepsis ([place])    Thyroid disease    Wears dentures      Significant Hospital Events: Including procedures, antibiotic start and stop dates in addition to other pertinent events     Interim History / Subjective:    Objective   Blood pressure 110/77, pulse (!) 130, temperature (!) 95.3 F (35.2 C), resp. rate 19, height 5' (1.524 m), weight 49.9 kg, SpO2 96 %.    Vent Mode: PRVC FiO2 (%):  [100 %] 100 % Set Rate:  [18 bmp] 18 bmp Vt Set:  [360 mL] 360 mL PEEP:  [5 cmH20] 5 cmH20 Plateau Pressure:  [18 cmH20] 18 cmH20   Intake/Output Summary (Last 24 hours) at 06/22/2022 1521 Last data filed at 06/16/2022 1459 Gross per 24 hour  Intake 1094.81 ml  Output --   Net 1094.81 ml   Filed Weights   06/14/2022 1308  Weight: 49.9 kg    Examination: Physical exam: General: Crtitically ill-appearing female, orally intubated HEENT: [place]/AT, eyes anicteric.  ETT and OGT in place Neuro: Eyes closed, does not open, not following commands, no withdrawal to painful stimuli.  Absent pupillary and corneal reflexes, weak cough present Chest: Coarse breath sounds, no wheezes or rhonchi Heart: Regular rate and rhythm, no murmurs or gallops Abdomen: Soft, nondistended, bowel sounds present Skin: No rash   Resolved Hospital Problem list     Assessment & Plan:  S/p PEA cardiac arrest post hemodialysis patient could be related to hypotension Continue telemetry monitoring Troponins are pending Echocardiogram was done in August 1245 showing diastolic dysfunction with EF 65% EKG did not show changes consistent with ACS Continue normothermia protocol Avoid seizure and fever  Acute hypoxic respiratory failure due to aspiration pneumonia Sepsis with septic shock Continue lung protective ventilation VAP prevention bundle in place PAD protocol with fentanyl with RASS goal -1 Follow-up respiratory culture and blood cultures Continue IV Unasyn Continue vasopressor support MAP goal 65, currently on Levophed  End-stage renal disease on hemodialysis Will consult nephrology tomorrow, currently she does not need dialysis support as she just completed dialysis today  Poorly controlled diabetes type 2 with hyperglycemia Patient hemoglobin A1c is 9.7 Monitor fingerstick goal 140-180 Continue sliding scale insulin She takes Lantus at home, hold  for now  Lactic acidosis In the setting of cardiac arrest Trend lactate  Chronic HFpEF Patient had echocardiogram in 01/2022 Showing EF 62% with diastolic dysfunction Monitor intake and output    Best Practice (right click and "Reselect all SmartList Selections" daily)   Diet/type: NPO DVT prophylaxis: prophylactic  heparin  GI prophylaxis: H2B Lines: N/A Foley:  Yes, and it is still needed Code Status:  full code Last date of multidisciplinary goals of care discussion [12/16:] Patient's daughter Orvil Feil was called and updated for patient condition, decision was to continue full scope of care while keeping a full code  Labs   CBC: Recent Labs  Lab 06/20/2022 1418 06/11/2022 1424  WBC 5.7  --   NEUTROABS 4.1  --   HGB 14.6 16.7*  HCT 49.5* 49.0*  MCV 99.0  --   PLT 67*  --     Basic Metabolic Panel: Recent Labs  Lab 07/02/2022 1424  NA 140  K 4.0  CL 105  GLUCOSE 164*  BUN 14  CREATININE 2.20*   GFR: Estimated Creatinine Clearance: 16.4 mL/min (A) (by C-G formula based on SCr of 2.2 mg/dL (H)). Recent Labs  Lab 06/14/2022 1418  WBC 5.7  LATICACIDVEN 5.2*    Liver Function Tests: No results for input(s): "AST", "ALT", "ALKPHOS", "BILITOT", "PROT", "ALBUMIN" in the last 168 hours. No results for input(s): "LIPASE", "AMYLASE" in the last 168 hours. No results for input(s): "AMMONIA" in the last 168 hours.  ABG    Component Value Date/Time   PHART 7.426 07/15/2020 0454   PCO2ART 42.6 07/15/2020 0454   PO2ART 67.5 (L) 07/15/2020 0454   HCO3 27.6 07/15/2020 0454   TCO2 24 06/18/2022 1424   O2SAT 90.1 07/15/2020 0454     Coagulation Profile: Recent Labs  Lab 06/03/2022 1418  INR 1.4*    Cardiac Enzymes: No results for input(s): "CKTOTAL", "CKMB", "CKMBINDEX", "TROPONINI" in the last 168 hours.  HbA1C: HbA1c, POC (controlled diabetic range)  Date/Time Value Ref Range Status  05/17/2022 11:08 AM 9.7 (A) 0.0 - 7.0 % Final  11/16/2021 11:05 AM 8.0 (A) 0.0 - 7.0 % Final   Hgb A1c MFr Bld  Date/Time Value Ref Range Status  02/18/2022 02:05 PM 7.7 (H) 4.8 - 5.6 % Final    Comment:    (NOTE) Pre diabetes:          5.7%-6.4%  Diabetes:              >6.4%  Glycemic control for   <7.0% adults with diabetes     CBG: No results for input(s): "GLUCAP" in the last  168 hours.  Review of Systems:   Unable to obtain as patient is intubated and sedated  Past Medical History:  She,  has a past medical history of Dementia without behavioral disturbance ([place]) (02/20/2022), Diabetes mellitus, ESRD (end stage renal disease) ([place]), GERD (gastroesophageal reflux disease), Hyperlipidemia, Hypertension, Iron deficiency anemia, MRSA infection, Neuropathy, Pneumonia (07/2016), Seizure-like activity ([place]), Sepsis ([place]), Thyroid disease, and Wears dentures.   Surgical History:   Past Surgical History:  Procedure Laterality Date   AMPUTATION Left 05/21/2015   Procedure: Left Foot 3rd Ray Amputation;  Surgeon: Newt Minion, MD;  Location: [facility];  Service: Orthopedics;  Laterality: Left;   AV FISTULA PLACEMENT Left 03/28/2016   Procedure: ARTERIOVENOUS (AV) FISTULA CREATION LEFT ARM;  Surgeon: Waynetta Sandy, MD;  Location: [facility];  Service: Vascular;  Laterality: Left;   AV FISTULA PLACEMENT Right 06/04/2019  Procedure: ARTERIOVENOUS (AV) FISTULA CREATION RIGHT ARM;  Surgeon: Waynetta Sandy, MD;  Location: [facility];  Service: Vascular;  Laterality: Right;   AV FISTULA PLACEMENT Left 03/11/2021   Procedure: INSERTION OF LEFT ARM ARTERIOVENOUS (AV) GORE-TEX GRAFT;  Surgeon: Waynetta Sandy, MD;  Location: [facility];  Service: Vascular;  Laterality: Left;   DILATION AND CURETTAGE OF UTERUS     EYE SURGERY     FISTULA SUPERFICIALIZATION Left 09/04/2016   Procedure: FISTULA SUPERFICIALIZATION LEFT UPPER ARM;  Surgeon: Waynetta Sandy, MD;  Location: [facility];  Service: Vascular;  Laterality: Left;   FISTULA SUPERFICIALIZATION Right 10/08/2019   Procedure: FISTULA SUPERFICIALIZATION OR RIGHT ARM;  Surgeon: Waynetta Sandy, MD;  Location: [facility];  Service: Vascular;  Laterality: Right;   IR AV DIALY SHUNT INTRO NEEDLE/INTRAC INITIAL W/PTA/STENT/IMG LT Left 06/13/2017   IR AV DIALY SHUNT INTRO NEEDLE/INTRAC INITIAL W/PTA/STENT/IMG LT Left  09/12/2017   IR AV DIALY SHUNT INTRO NEEDLE/INTRACATH INITIAL W/PTA/IMG LEFT  11/10/2016   IR AV DIALY SHUNT INTRO NEEDLE/INTRACATH INITIAL W/PTA/IMG LEFT  03/09/2017   IR AV DIALY SHUNT INTRO NEEDLE/INTRACATH INITIAL W/PTA/IMG LEFT  12/12/2017   IR AV DIALY SHUNT INTRO NEEDLE/INTRACATH INITIAL W/PTA/IMG LEFT  02/27/2018   IR AV DIALY SHUNT INTRO NEEDLE/INTRACATH INITIAL W/PTA/IMG LEFT  06/05/2018   IR AV DIALY SHUNT INTRO NEEDLE/INTRACATH INITIAL W/PTA/IMG LEFT  09/11/2018   IR AV DIALY SHUNT INTRO NEEDLE/INTRACATH INITIAL W/PTA/IMG LEFT  12/26/2021   IR AV DIALY SHUNT INTRO NEEDLE/INTRACATH INITIAL W/PTA/IMG RIGHT Right 04/21/2020   IR AV DIALY SHUNT INTRO NEEDLE/INTRACATH INITIAL W/PTA/IMG RIGHT Right 10/08/2020   IR DIALY SHUNT INTRO NEEDLE/INTRACATH INITIAL W/IMG LEFT Left 05/08/2018   IR DIALY SHUNT INTRO NEEDLE/INTRACATH INITIAL W/IMG LEFT Left 06/17/2021   IR FLUORO GUIDE CV LINE RIGHT  02/01/2019   IR FLUORO GUIDE CV LINE RIGHT  07/14/2020   IR FLUORO GUIDE CV LINE RIGHT  12/29/2020   IR GENERIC HISTORICAL  08/01/2016   IR FLUORO GUIDE CV LINE RIGHT 08/01/2016 [name], MD [facility]   IR GENERIC HISTORICAL  08/01/2016   IR US GUIDE VASC ACCESS RIGHT 08/01/2016 [name], MD [facility]   IR REMOVAL TUN CV CATH W/O FL  11/01/2016   IR REMOVAL TUN CV CATH W/O FL  01/02/2020   IR REMOVAL TUN CV CATH W/O FL  05/04/2021   IR THROMBECTOMY AV FISTULA W/THROMBOLYSIS INC/SHUNT/IMG LEFT Left 01/31/2019   IR THROMBECTOMY AV FISTULA W/THROMBOLYSIS/PTA INC/SHUNT/IMG LEFT Left 01/27/2019   IR THROMBECTOMY AV FISTULA W/THROMBOLYSIS/PTA INC/SHUNT/IMG LEFT Left 07/16/2020   IR THROMBECTOMY AV FISTULA W/THROMBOLYSIS/PTA/STENT INC/SHUNT/IMG RT Right 09/24/2020   IR US GUIDE VASC ACCESS LEFT  11/10/2016   IR US GUIDE VASC ACCESS LEFT  09/12/2017   IR US GUIDE VASC ACCESS LEFT  12/12/2017   IR US GUIDE VASC ACCESS LEFT  06/05/2018   IR US GUIDE VASC ACCESS LEFT  09/11/2018   IR US GUIDE VASC ACCESS LEFT  01/27/2019   IR  US GUIDE VASC ACCESS LEFT  02/01/2019   IR US GUIDE [place] LEFT  12/26/2021   IR US GUIDE VASC ACCESS RIGHT  02/01/2019   IR US GUIDE VASC ACCESS RIGHT  07/14/2020   IR US GUIDE VASC ACCESS RIGHT  07/16/2020   IR US GUIDE VASC ACCESS RIGHT  10/08/2020   IR US GUIDE VASC ACCESS RIGHT  12/29/2020   PERIPHERAL VASCULAR BALLOON ANGIOPLASTY Left 07/22/2021   Procedure: PERIPHERAL VASCULAR BALLOON ANGIOPLASTY;  Surgeon: Cherre Robins,  MD;  Location: [facility];  Service: Cardiovascular;  Laterality: Left;   UPPER EXTREMITY VENOGRAPHY Bilateral 02/28/2021   Procedure: UPPER EXTREMITY VENOGRAPHY;  Surgeon: Waynetta Sandy, MD;  Location: [facility];  Service: Cardiovascular;  Laterality: Bilateral;     Social History:   reports that she has never smoked. She has never used smokeless tobacco. She reports that she does not currently use alcohol. She reports that she does not use drugs.   Family History:  Her family history is not on file.   Allergies No Known Allergies   Home Medications  Prior to Admission medications   Medication Sig Start Date End Date Taking? Authorizing Provider  acetaminophen (TYLENOL) 325 MG tablet Take 2 tablets (650 mg total) by mouth every 6 (six) hours as needed for mild pain (or Fever >/= 101). 03/08/22   Sheikh, Omair Latif, DO  atorvastatin (LIPITOR) 40 MG tablet TAKE 1 TABLET (40 MG TOTAL) BY MOUTH DAILY. (office visit for refills) Patient taking differently: Take 40 mg by mouth daily. 11/16/21 11/16/22  [name], MD  Budesonide 4 MG CPDR Take 4 mg by mouth daily. 05/01/22   Reed, Tiffany L, DO  insulin glargine (LANTUS) 100 UNIT/ML injection Inject 0.02 mLs (2 Units total) into the skin daily. 05/17/22   [name], MD  midodrine (PROAMATINE) 10 MG tablet Take 1 tablet (10 mg total) by mouth 3 (three) times daily with meals. 03/24/22   Raspet, Junie Panning K, PA-C  midodrine (PROAMATINE) 10 MG tablet Take 1 tablet (10 mg total) by mouth 3  (three) times daily. 04/04/22   Madelon Lips, MD  Nutritional Supplements (,FEEDING SUPPLEMENT, PROSOURCE PLUS) liquid Take 30 mLs by mouth 2 (two) times daily between meals. 03/09/22   Raiford Noble Latif, DO  Nutritional Supplements (FEEDING SUPPLEMENT, NEPRO CARB STEADY,) LIQD Take 237 mLs by mouth See admin instructions. Take 1 can (237 ml) by mouth once daily on Tuesdays, Thursdays & Saturday morning. May take an additional can (237 ml) by mouth if needed for poor appetite.    [provider]  ondansetron (ZOFRAN) 4 MG tablet Take 1 tablet (4 mg total) by mouth every 6 (six) hours as needed for nausea. 03/08/22   Raiford Noble Latif, DO  pantoprazole (PROTONIX) 40 MG tablet Take 1 tablet (40 mg total) by mouth daily. 05/17/22   [name], MD  thiamine (VITAMIN B1) 100 MG tablet Take 1 tablet (100 mg total) by mouth daily. 03/09/22   Kerney Elbe, DO     Critical care time:     This patient is critically ill with multiple organ system failure which requires frequent high complexity decision making, assessment, support, evaluation, and titration of therapies. This was completed through the application of advanced monitoring technologies and extensive interpretation of multiple databases.  During this encounter critical care time was devoted to patient care services described in this note for 38 minutes.    Jacky Kindle, MD [facility] See Amion for pager If no response to pager, please call [phone] until 7pm After 7pm, Please call E-link [phone]

## 2022-06-17 NOTE — Progress Notes (Signed)
Pharmacy Antibiotic Note  Sarah Kelley is a 71 y.o. female admitted on 06/13/2022 with  aspiration pnuemonia s/p cardiac post arrest .  Pharmacy has been consulted for Unasyn dosing. Pt has ESRD on iHD MWF. Last iHD was 12/16 and was completed prior to arrest event per EMS. WBC 5.7, hypothermic 95.22F, on vasopressor (NE@16 )  Plan: Unasyn 3g IV q 12h   for current renal function F/u renal function and RRT plans (last iHD 12/16)  -f/u culture data, LOT   Height: 5' (152.4 cm) Weight: 49.9 kg (110 lb 0.2 oz) IBW/kg (Calculated) : 45.5  Temp (24hrs), Avg:93.4 F (34.1 C), Min:90.9 F (32.7 C), Max:95.1 F (35.1 C)  Recent Labs  Lab 06/29/2022 1418 06/04/2022 1424  WBC 5.7  --   CREATININE  --  2.20*    Estimated Creatinine Clearance: 16.4 mL/min (A) (by C-G formula based on SCr of 2.2 mg/dL (H)).    No Known Allergies  Antimicrobials this admission: Unasyn 12/16>  Dose adjustments this admission:   Microbiology results:   Thank you for allowing pharmacy to be a part of this patient's care.  Wilson Singer, PharmD Clinical Pharmacist 06/10/2022 3:09 PM

## 2022-06-17 NOTE — Procedures (Signed)
Central Venous Catheter Insertion Procedure Note  Sarah Kelley  [id number]  May 26, 1951  Date:07/01/2022  Time:6:02 PM   Provider Performing:Daily Crate   Procedure: Insertion of Non-tunneled Central Venous [phone]) with US guidance (01027)   Indication(s) Medication administration  Consent Risks of the procedure as well as the alternatives and risks of each were explained to the patient and/or caregiver.  Consent for the procedure was obtained and is signed in the bedside chart  Anesthesia Topical only with 1% lidocaine   Timeout Verified patient identification, verified procedure, site/side was marked, verified correct patient position, special equipment/implants available, medications/allergies/relevant history reviewed, required imaging and test results available.  Sterile Technique Maximal sterile technique including full sterile barrier drape, hand hygiene, sterile gown, sterile gloves, mask, hair covering, sterile ultrasound probe cover (if used).  Procedure Description Area of catheter insertion was cleaned with chlorhexidine and draped in sterile fashion.  With real-time ultrasound guidance a central venous catheter was placed into the right subclavian vein. Nonpulsatile blood flow and easy flushing noted in all ports.  The catheter was sutured in place and sterile dressing applied.  Complications/Tolerance None; patient tolerated the procedure well. Chest X-ray is ordered to verify placement for internal jugular or subclavian cannulation.   Chest x-ray is not ordered for femoral cannulation.  EBL Minimal  Specimen(s) None

## 2022-06-17 NOTE — ED Notes (Signed)
Per Tacy Learn MD, increase levo gtt and get pt to ICU so a central line can be placed up there.

## 2022-06-17 NOTE — Progress Notes (Signed)
RT attempted to obtain ABG and was unsuccessful, ICU RT alerted to attempt ABG

## 2022-06-17 NOTE — ED Notes (Signed)
Notified xray of need for portable xrays for chest and verification of OG tube

## 2022-06-17 NOTE — ED Notes (Signed)
Attempted report x1. 

## 2022-06-17 NOTE — ED Notes (Signed)
Notified Chand MD about pt MAP being 38 and Levo at 82mcg/min

## 2022-06-17 NOTE — ED Notes (Signed)
Lab at bedside attempting lab draw.

## 2022-06-17 NOTE — ED Provider Notes (Signed)
[facility] EMERGENCY DEPARTMENT Provider Note   CSN: [id number] Arrival date & time: 06/21/2022  1255     History  Chief Complaint  Patient presents with   Post CPR    Sarah Kelley is a 71 y.o. female with history of hypertension, type 2 diabetes, end-stage renal disease on dialysis Monday Wednesday Friday, presenting to the emergency department with PEA cardiac arrest.  History is provided by EMS reports that the patient completed dialysis and then became unresponsive, fell to the ground. 3 to 4-minute downtime for fire rescue arrival, found patient in PEA arrest.  Patient was intubated with a 7 oh ET tube.  Patient was given epi twice, 1 round of bicarbonate, and 1 L of IV fluids, had ROSC after 11 minutes of coding.  Subsequently she was found to be in A-fib.  Patient arrives unresponsive and intubated.  EMS reports per family wishes on scene the patient is "full code".  HPI     Home Medications Prior to Admission medications   Medication Sig Start Date End Date Taking? Authorizing Provider  acetaminophen (TYLENOL) 325 MG tablet Take 2 tablets (650 mg total) by mouth every 6 (six) hours as needed for mild pain (or Fever >/= 101). 03/08/22   Sheikh, Omair Latif, DO  atorvastatin (LIPITOR) 40 MG tablet TAKE 1 TABLET (40 MG TOTAL) BY MOUTH DAILY. (office visit for refills) Patient taking differently: Take 40 mg by mouth daily. 11/16/21 11/16/22  [name], MD  Budesonide 4 MG CPDR Take 4 mg by mouth daily. 05/01/22   Reed, Tiffany L, DO  insulin glargine (LANTUS) 100 UNIT/ML injection Inject 0.02 mLs (2 Units total) into the skin daily. 05/17/22   [name], MD  midodrine (PROAMATINE) 10 MG tablet Take 1 tablet (10 mg total) by mouth 3 (three) times daily with meals. 03/24/22   Raspet, Junie Panning K, PA-C  midodrine (PROAMATINE) 10 MG tablet Take 1 tablet (10 mg total) by mouth 3 (three) times daily. 04/04/22   Madelon Lips, MD  Nutritional Supplements  (,FEEDING SUPPLEMENT, PROSOURCE PLUS) liquid Take 30 mLs by mouth 2 (two) times daily between meals. 03/09/22   Raiford Noble Latif, DO  Nutritional Supplements (FEEDING SUPPLEMENT, NEPRO CARB STEADY,) LIQD Take 237 mLs by mouth See admin instructions. Take 1 can (237 ml) by mouth once daily on Tuesdays, Thursdays & Saturday morning. May take an additional can (237 ml) by mouth if needed for poor appetite.    [provider]  ondansetron (ZOFRAN) 4 MG tablet Take 1 tablet (4 mg total) by mouth every 6 (six) hours as needed for nausea. 03/08/22   Raiford Noble Latif, DO  pantoprazole (PROTONIX) 40 MG tablet Take 1 tablet (40 mg total) by mouth daily. 05/17/22   [name], MD  thiamine (VITAMIN B1) 100 MG tablet Take 1 tablet (100 mg total) by mouth daily. 03/09/22   Kerney Elbe, DO      Allergies    Patient has no known allergies.    Review of Systems   Review of Systems  Physical Exam Updated Vital Signs BP 110/77   Pulse (!) 130   Temp (!) 95.3 F (35.2 C)   Resp 19   Ht 5' (1.524 m)   Wt 49.9 kg   LMP  (LMP Unknown)   SpO2 96%   BMI 21.48 kg/m  Physical Exam Constitutional:      Comments: Unresponsive, obtunded  HENT:     Head: Normocephalic and atraumatic.  Cardiovascular:  Rate and Rhythm: Tachycardia present. Rhythm irregular.  Pulmonary:     Comments: Bilateral ventilated breath sounds Abdominal:     General: There is no distension.     Tenderness: There is no abdominal tenderness.  Skin:    General: Skin is warm and dry.  Neurological:     Comments: GCS 3     ED Results / Procedures / Treatments   Labs (all labs ordered are listed, but only abnormal results are displayed) Labs Reviewed  LACTIC ACID, PLASMA - Abnormal; Notable for the following components:      Result Value   Lactic Acid, Venous 5.2 (*)    All other components within normal limits  COMPREHENSIVE METABOLIC PANEL - Abnormal; Notable for the following components:   Glucose,  Bld 160 (*)    Creatinine, Ser 2.44 (*)    Albumin 2.5 (*)    AST 107 (*)    Alkaline Phosphatase 296 (*)    Total Bilirubin 1.3 (*)    GFR, Estimated 20 (*)    All other components within normal limits  CBC WITH DIFFERENTIAL/PLATELET - Abnormal; Notable for the following components:   HCT 49.5 (*)    MCHC 29.5 (*)    RDW 17.8 (*)    Platelets 67 (*)    All other components within normal limits  PROTIME-INR - Abnormal; Notable for the following components:   Prothrombin Time 16.9 (*)    INR 1.4 (*)    All other components within normal limits  APTT - Abnormal; Notable for the following components:   aPTT 39 (*)    All other components within normal limits  I-STAT CHEM 8, ED - Abnormal; Notable for the following components:   Creatinine, Ser 2.20 (*)    Glucose, Bld 164 (*)    Calcium, Ion 1.07 (*)    Hemoglobin 16.7 (*)    HCT 49.0 (*)    All other components within normal limits  TROPONIN I (HIGH SENSITIVITY) - Abnormal; Notable for the following components:   Troponin I (High Sensitivity) 156 (*)    All other components within normal limits  CULTURE, BLOOD (ROUTINE X 2)  CULTURE, BLOOD (ROUTINE X 2)  CULTURE, RESPIRATORY W GRAM STAIN  LACTIC ACID, PLASMA  URINALYSIS, ROUTINE W REFLEX MICROSCOPIC  CBC  BLOOD GAS, ARTERIAL  I-STAT ARTERIAL BLOOD GAS, ED  TROPONIN I (HIGH SENSITIVITY)    EKG EKG Interpretation  Date/Time:  Saturday June 17 2022 13:04:34 EST Ventricular Rate:  134 PR Interval:    QRS Duration: 136 QT Interval:  346 QTC Calculation: 517 R Axis:   -80 Text Interpretation: Atrial fibrillation Right bundle branch block Inferior infarct, old Lateral leads are also involved Confirmed by Octaviano Glow [phone]) on 06/10/2022 1:08:25 PM  Radiology DG Abdomen 1 View  Result Date: 06/16/2022 CLINICAL DATA:  OG verification Status post CPR EXAM: ABDOMEN - 1 VIEW COMPARISON:  02/26/2022 FINDINGS: Orogastric tube terminates within the stomach, likely at  the level of the fundus. No dilated loops of bowel to indicate ileus or obstruction. Right pleural effusion partially visualized. IMPRESSION: Orogastric tube terminates at the level the gastric fundus. Electronically Signed   By: Miachel Roux M.D.   On: 07/02/2022 15:07   DG Chest Port 1 View  Result Date: 07/01/2022 CLINICAL DATA:  Questionable sepsis - evaluate for abnormality EXAM: PORTABLE CHEST - 1 VIEW COMPARISON:  03/17/2022 FINDINGS: The heart appears mildly enlarged. There has been interval development of diffuse bilateral airspace opacities, greatest concentration in  the right mid lung. Moderate size right pleural effusion is present. Endotracheal tube tip is difficult to visualize due to overlying support structures. It appears at least to the level of the carina and should be retracted approximately 3 cm. Nasogastric tube extends below the left hemidiaphragm. The distal tip is not included in the study. Angulated vascular stent again seen in the left axilla, unchanged from prior exam. IMPRESSION: 1. Interval development of diffuse bilateral airspace opacities, greatest concentration in the right mid lung. 2. Moderate size right pleural effusion. 3. Limited evaluation of the endotracheal tube tip which is at least the level the carina and somewhat atypical to the right side. Recommend retracting 3 cm and repeating radiographs. These results were called by telephone at the time of interpretation on 06/29/2022 at 3:04 pm to provider Troy Hartzog , who verbally acknowledged these results. Electronically Signed   By: Miachel Roux M.D.   On: 06/29/2022 15:05   CT Head Wo Contrast  Result Date: 06/25/2022 CLINICAL DATA:  71 year old female status post witnessed cardiac arrest. Seizure like activity. Status post CPR. Dialysis patient. EXAM: CT HEAD WITHOUT CONTRAST TECHNIQUE: Contiguous axial images were obtained from the base of the skull through the vertex without intravenous contrast. RADIATION  DOSE REDUCTION: This exam was performed according to the departmental dose-optimization program which includes automated exposure control, adjustment of the mA and/or kV according to patient size and/or use of iterative reconstruction technique. COMPARISON:  Brain MRI 04/17/2017.  Head CT 02/18/2022. FINDINGS: Brain: Chronic right parietal lobe encephalomalacia, chronic small vessel disease in the brainstem, small chronic cerebellar infarcts, chronic bilateral deep gray nuclei infarcts appear stable since August. Stable cerebral volume. No midline shift, mass effect, or evidence of intracranial mass lesion. No ventriculomegaly. No acute intracranial hemorrhage identified. Maintained gray-white differentiation. Vascular: Extensive Calcified atherosclerosis at the skull base. No suspicious intracranial vascular hyperdensity. Skull: No acute osseous abnormality identified. Sinuses/Orbits: Intubated. Fluid in the pharynx. New right mastoid fluid/opacification. Paranasal sinuses are fairly stable and well aerated. Other: Chronic orbit soft tissue changes. Calcified scalp vessel atherosclerosis. IMPRESSION: 1. No acute intracranial abnormality. Stable non contrast CT appearance of advanced chronic ischemic disease since August. 2. Intubated. Electronically Signed   By: Genevie Ann M.D.   On: 06/12/2022 14:04    Procedures .Critical Care  Performed by: Wyvonnia Dusky, MD Authorized by: Wyvonnia Dusky, MD   Critical care provider statement:    Critical care time (minutes):  45   Critical care time was exclusive of:  Separately billable procedures and treating other patients   Critical care was necessary to treat or prevent imminent or life-threatening deterioration of the following conditions:  Circulatory failure   Critical care was time spent personally by me on the following activities:  Ordering and performing treatments and interventions, ordering and review of laboratory studies, ordering and review of  radiographic studies, pulse oximetry, review of old charts, examination of patient and evaluation of patient's response to treatment     Medications Ordered in ED Medications  0.9 %  sodium chloride infusion (has no administration in time range)  norepinephrine (LEVOPHED) 4mg  in 271mL (0.016 mg/mL) premix infusion (16 mcg/min Intravenous Rate/Dose Change 06/24/2022 1457)  fentaNYL (SUBLIMAZE) injection 50 mcg (50 mcg Intravenous Not Given 06/07/2022 1313)  fentaNYL 2514mcg in NS 213mL (57mcg/ml) infusion-PREMIX (100 mcg/hr Intravenous Infusion Verify 06/16/2022 1435)  0.9 %  sodium chloride infusion (0 mLs Intravenous Stopped 06/10/2022 1459)  polyethylene glycol (MIRALAX / GLYCOLAX) packet 17 g (has  no administration in time range)  heparin injection 5,000 Units (has no administration in time range)  famotidine (PEPCID) tablet 10 mg (has no administration in time range)  acetaminophen (TYLENOL) tablet 650 mg (has no administration in time range)    Or  acetaminophen (TYLENOL) 160 MG/5ML solution 650 mg (has no administration in time range)    Or  acetaminophen (TYLENOL) suppository 650 mg (has no administration in time range)  busPIRone (BUSPAR) tablet 30 mg (has no administration in time range)    Or  busPIRone (BUSPAR) tablet 30 mg (has no administration in time range)  magnesium sulfate IVPB 2 g 50 mL (has no administration in time range)  0.9 %  sodium chloride infusion (has no administration in time range)  docusate (COLACE) 50 MG/5ML liquid 100 mg (has no administration in time range)  Ampicillin-Sulbactam (UNASYN) 3 g in sodium chloride 0.9 % 100 mL IVPB (has no administration in time range)  insulin aspart (novoLOG) injection 0-15 Units (has no administration in time range)    ED Course/ Medical Decision Making/ A&P Clinical Course as of 06/18/2022 1549  Sat Jun 17, 2022  1415 ICU team consulted [MT]  1548 Icu team made aware of ett tube needing pulled back 3 cm and large aspiration -  patient in ICU now, they will order antibiotics and manage further care.  Lactate > 5. [MT]    Clinical Course User Index [MT] Labria Wos, Carola Rhine, MD                           Medical Decision Making Amount and/or Complexity of Data Reviewed Labs: ordered. Radiology: ordered. ECG/medicine tests: ordered.  Risk Prescription drug management. Decision regarding hospitalization.   Patient arrives after PEA cardiac arrest with 11 minutes total code time, approximate 15 minutes in total between downtime and ROSC.  On arrival she is in A-fib and tachycardic.  Blood pressure was intermittently low for EMS, who had started an epinephrine drip, which was switched to Levophed on her arrival.  Bedside ultrasound appeared to show that she continues to appear volume depleted, additional liter of fluid was ordered by myself.  The patient had a right humerus IO placed by EMS as well as left EJ access (she is on dialysis, bilateral fistula sites).    I did attempt to place a central line upon her arrival but had difficulty obtaining access through the right jugular vein due to diminished size, suspected volume depletion.  I was unsuccessful after 2 attempts.  Subsequently asked that the patient be taken to CT for priority brain scan and pulmonary embolism rule out, as we did not have sufficient access to run her peripheral medications.  I spoke to critical care and placed a stat consult for admission.  Patient's family was reportedly en route to the hospital.  IV fentanyl bolus was given for pain and infusion started for sedation.  The patient had a CT scan of her brain which showed no  acute stroke/ insult. X-ray of the chest concerning for right-sided aspiration, ET tube likely needing to be retracted.  ICU team was consulted and the patient was admitted for further medical care.  Her labs my review showed an elevated lactate, chronically elevated creatinine.  Potassium within normal limits.  White blood  cell count within normal limits.        Final Clinical Impression(s) / ED Diagnoses Final diagnoses:  Cardiac arrest ([place])    Rx / DC  Orders ED Discharge Orders     None         Wyvonnia Dusky, MD 07/02/2022 1550

## 2022-06-17 NOTE — ED Triage Notes (Signed)
Pt arrived to ED from home after witnessed cardiac arrest after sz like activity. Initial rhythm was narrow complex PEA in the 50's. CPR initiated at 1210 and ROSC achieved at 1221. Pt is dialysis pt who went to dialysis and was fine during and after dialysis. Bilateral fistulas, IO to R humerus and 16g L EJ. CBG 186. 150's HR, BP 110/70, Cap 40's. Intubated w/ 7.0 tube. Pt received 2 epi, 1g Calcium, 1 amp bicarb, 1L NS bolus and epi gtt started at 6 mcg/min.

## 2022-06-17 NOTE — IPAL (Signed)
Interdisciplinary Goals of Care Family Meeting   Date carried out:: 06/09/2022  Location of the meeting: Bedside  Member's involved: Physician, Bedside Registered Nurse, and Family Member or next of kin  Durable Power of Attorney or acting medical decision maker: Chriss Driver     Discussion: We discussed goals of care for [employer] .    The Clinical status was relayed to patient's Son at bedside in detail.   Updated and notified of patients medical condition.     Patient remains unresponsive and will not open eyes to command.   Patient is having a weak cough Patient had cardiac arrest, she may have brain damage and now she is requiring artificial mechanical ventilation for breathing, her BP is low, requiring medication to keep her BP up.   Patient with Progressive multiorgan failure with a very high probablity of a very minimal chance of meaningful recovery despite all aggressive and optimal medical therapy.  Code status: Full DNR  Disposition: Continue current acute care    Family are satisfied with Plan of action and management. All questions answered   Jacky Kindle MD [facility] See Amion for pager If no response to pager, please call [phone] until 7pm After 7pm, Please call E-link [phone]

## 2022-06-17 NOTE — Progress Notes (Signed)
MB arrived to room to perform Routine EEG and Ultrasound was just set up and getting started. They need approximately 30 minutes before Patient will be available.

## 2022-06-17 NOTE — Procedures (Signed)
Patient Name: Sarah Kelley  MRN: [id number]  Epilepsy Attending: Lora Havens  Referring Physician/Provider: Jacky Kindle, MD  Date: 06/20/2022 Duration: 23.04 mins  Patient history: 71yo F s/p cardiac arrest. EEG to evaluate for seizure.  Level of alertness: comatose  AEDs during EEG study: None  Technical aspects: This EEG study was done with scalp electrodes positioned according to the 10-20 International system of electrode placement. Electrical activity was reviewed with band pass filter of 1-70Hz , sensitivity of 7 uV/mm, display speed of 98mm/sec with a 60Hz  notched filter applied as appropriate. EEG data were recorded continuously and digitally stored.  Video monitoring was available and reviewed as appropriate.  Description: EEG showed continuous generalized low amplitude 2-3Hz  delta slowing. Hyperventilation and photic stimulation were not performed.     Of note, study was technically difficult due to significant myogenic artifact.  ABNORMALITY - Continuous slow, generalized  IMPRESSION: This technically difficult study is suggestive of severe diffuse encephalopathy, nonspecific etiology. No seizures or definite epileptiform discharges were seen throughout the recording.  Kindra Bickham Barbra Sarks

## 2022-06-17 NOTE — ED Notes (Signed)
Notified Trifan MD about difficulty obtaining labs and further access. Phlebotomy at bedside obtained all labs except blood cultures which provider was notified

## 2022-06-17 NOTE — ED Notes (Signed)
Notified EDP about pt levo being at 55mcg/min to maintain MAP > 65. Per EDP, keep levo where it is and he will attempt central line access again.

## 2022-06-17 NOTE — Progress Notes (Signed)
[facility] Progress Note Patient Name: Sarah Kelley DOB: [birth year] MRN: [id number]   Date of Service  06/18/2022  HPI/Events of Note  RT unsuccessful with placement of A-Line, On 74mcq Levo cuff pressure at 10pm 125/52(73)  eICU Interventions  Bedside CCM team informed     Intervention Category Intermediate Interventions: Other:;Hypotension - evaluation and management  Judd Lien 06/27/2022, 10:53 PM

## 2022-06-18 ENCOUNTER — Inpatient Hospital Stay (HOSPITAL_COMMUNITY): Payer: Medicaid Other

## 2022-06-18 DIAGNOSIS — L8990 Pressure ulcer of unspecified site, unspecified stage: Secondary | ICD-10-CM | POA: Insufficient documentation

## 2022-06-18 DIAGNOSIS — R9431 Abnormal electrocardiogram [ECG] [EKG]: Secondary | ICD-10-CM

## 2022-06-18 DIAGNOSIS — A419 Sepsis, unspecified organism: Secondary | ICD-10-CM

## 2022-06-18 DIAGNOSIS — I469 Cardiac arrest, cause unspecified: Secondary | ICD-10-CM

## 2022-06-18 DIAGNOSIS — R6521 Severe sepsis with septic shock: Secondary | ICD-10-CM

## 2022-06-18 LAB — BASIC METABOLIC PANEL
Anion gap: 10 (ref 5–15)
Anion gap: 16 — ABNORMAL HIGH (ref 5–15)
BUN: 14 mg/dL (ref 8–23)
BUN: 16 mg/dL (ref 8–23)
CO2: 27 mmol/L (ref 22–32)
CO2: 20 mmol/L — ABNORMAL LOW (ref 22–32)
Calcium: 9.5 mg/dL (ref 8.9–10.3)
Calcium: 9.2 mg/dL (ref 8.9–10.3)
Chloride: 105 mmol/L (ref 98–111)
Chloride: 106 mmol/L (ref 98–111)
Creatinine, Ser: 2.69 mg/dL — ABNORMAL HIGH (ref 0.44–1.00)
Creatinine, Ser: 2.96 mg/dL — ABNORMAL HIGH (ref 0.44–1.00)
GFR, Estimated: 18 mL/min — ABNORMAL LOW (ref >60)
GFR, Estimated: 16 mL/min — ABNORMAL LOW (ref >60)
Glucose, Bld: 53 mg/dL — ABNORMAL LOW (ref 70–99)
Glucose, Bld: 134 mg/dL — ABNORMAL HIGH (ref 70–99)
Potassium: 3.7 mmol/L (ref 3.5–5.1)
Potassium: 4.2 mmol/L (ref 3.5–5.1)
Sodium: 142 mmol/L (ref 135–145)
Sodium: 142 mmol/L (ref 135–145)

## 2022-06-18 LAB — POCT I-STAT 7, (LYTES, BLD GAS, ICA,H+H)
Acid-base deficit: 2 mmol/L (ref 0.0–2.0)
Acid-base deficit: 6 mmol/L — ABNORMAL HIGH (ref 0.0–2.0)
Acid-base deficit: 3 mmol/L — ABNORMAL HIGH (ref 0.0–2.0)
Acid-base deficit: 6 mmol/L — ABNORMAL HIGH (ref 0.0–2.0)
Acid-base deficit: 12 mmol/L — ABNORMAL HIGH (ref 0.0–2.0)
Bicarbonate: 27.7 mmol/L (ref 20.0–28.0)
Bicarbonate: 21.1 mmol/L (ref 20.0–28.0)
Bicarbonate: 22.2 mmol/L (ref 20.0–28.0)
Bicarbonate: 19.1 mmol/L — ABNORMAL LOW (ref 20.0–28.0)
Bicarbonate: 13.3 mmol/L — ABNORMAL LOW (ref 20.0–28.0)
Calcium, Ion: 1.26 mmol/L (ref 1.15–1.40)
Calcium, Ion: 1.24 mmol/L (ref 1.15–1.40)
Calcium, Ion: 1.22 mmol/L (ref 1.15–1.40)
Calcium, Ion: 1.21 mmol/L (ref 1.15–1.40)
Calcium, Ion: 1.17 mmol/L (ref 1.15–1.40)
HCT: 50 % — ABNORMAL HIGH (ref 36.0–46.0)
HCT: 50 % — ABNORMAL HIGH (ref 36.0–46.0)
HCT: 50 % — ABNORMAL HIGH (ref 36.0–46.0)
HCT: 51 % — ABNORMAL HIGH (ref 36.0–46.0)
HCT: 49 % — ABNORMAL HIGH (ref 36.0–46.0)
Hemoglobin: 17 g/dL — ABNORMAL HIGH (ref 12.0–15.0)
Hemoglobin: 17 g/dL — ABNORMAL HIGH (ref 12.0–15.0)
Hemoglobin: 17 g/dL — ABNORMAL HIGH (ref 12.0–15.0)
Hemoglobin: 17.3 g/dL — ABNORMAL HIGH (ref 12.0–15.0)
Hemoglobin: 16.7 g/dL — ABNORMAL HIGH (ref 12.0–15.0)
O2 Saturation: 97 %
O2 Saturation: 100 %
O2 Saturation: 100 %
O2 Saturation: 100 %
O2 Saturation: 100 %
Patient temperature: 98.8
Patient temperature: 37.3
Patient temperature: 100.5
Patient temperature: 99.5
Patient temperature: 98.8
Potassium: 3.8 mmol/L (ref 3.5–5.1)
Potassium: 4 mmol/L (ref 3.5–5.1)
Potassium: 3.9 mmol/L (ref 3.5–5.1)
Potassium: 4.4 mmol/L (ref 3.5–5.1)
Potassium: 4.4 mmol/L (ref 3.5–5.1)
Sodium: 142 mmol/L (ref 135–145)
Sodium: 142 mmol/L (ref 135–145)
Sodium: 142 mmol/L (ref 135–145)
Sodium: 140 mmol/L (ref 135–145)
Sodium: 140 mmol/L (ref 135–145)
TCO2: 30 mmol/L (ref 22–32)
TCO2: 23 mmol/L (ref 22–32)
TCO2: 23 mmol/L (ref 22–32)
TCO2: 20 mmol/L — ABNORMAL LOW (ref 22–32)
TCO2: 14 mmol/L — ABNORMAL LOW (ref 22–32)
pCO2 arterial: 65.3 mmHg (ref 32–48)
pCO2 arterial: 48.2 mmHg — ABNORMAL HIGH (ref 32–48)
pCO2 arterial: 41.7 mmHg (ref 32–48)
pCO2 arterial: 38 mmHg (ref 32–48)
pCO2 arterial: 29.6 mmHg — ABNORMAL LOW (ref 32–48)
pH, Arterial: 7.236 — ABNORMAL LOW (ref 7.35–7.45)
pH, Arterial: 7.252 — ABNORMAL LOW (ref 7.35–7.45)
pH, Arterial: 7.339 — ABNORMAL LOW (ref 7.35–7.45)
pH, Arterial: 7.313 — ABNORMAL LOW (ref 7.35–7.45)
pH, Arterial: 7.259 — ABNORMAL LOW (ref 7.35–7.45)
pO2, Arterial: 109 mmHg — ABNORMAL HIGH (ref 83–108)
pO2, Arterial: 243 mmHg — ABNORMAL HIGH (ref 83–108)
pO2, Arterial: 492 mmHg — ABNORMAL HIGH (ref 83–108)
pO2, Arterial: 431 mmHg — ABNORMAL HIGH (ref 83–108)
pO2, Arterial: 338 mmHg — ABNORMAL HIGH (ref 83–108)

## 2022-06-18 LAB — PROTEIN, PLEURAL OR PERITONEAL FLUID: Total protein, fluid: <3 g/dL

## 2022-06-18 LAB — MAGNESIUM
Magnesium: 1.9 mg/dL (ref 1.7–2.4)
Magnesium: 1.9 mg/dL (ref 1.7–2.4)

## 2022-06-18 LAB — BODY FLUID CELL COUNT WITH DIFFERENTIAL
Eos, Fluid: 0 %
Lymphs, Fluid: 28 %
Monocyte-Macrophage-Serous Fluid: 11 % — ABNORMAL LOW (ref 50–90)
Neutrophil Count, Fluid: 61 % — ABNORMAL HIGH (ref 0–25)
Total Nucleated Cell Count, Fluid: 284 cu mm (ref 0–1000)

## 2022-06-18 LAB — ECHOCARDIOGRAM LIMITED
Calc EF: 18.2 %
Height: 60 in
MV M vel: 4.48 m/s
MV Peak grad: 80.3 mmHg
Radius: 0.5 cm
S' Lateral: 4.6 cm
Single Plane A2C EF: 16 %
Single Plane A4C EF: 18.4 %
Weight: 1753.1 oz

## 2022-06-18 LAB — GLUCOSE, CAPILLARY
Comment 1: 0
Comment 1: 0
Glucose-Capillary: 39 mg/dL — CL (ref 70–99)
Glucose-Capillary: 42 mg/dL — CL (ref 70–99)
Glucose-Capillary: 204 mg/dL — ABNORMAL HIGH (ref 70–99)
Glucose-Capillary: 50 mg/dL — ABNORMAL LOW (ref 70–99)
Glucose-Capillary: 136 mg/dL — ABNORMAL HIGH (ref 70–99)
Glucose-Capillary: 67 mg/dL — ABNORMAL LOW (ref 70–99)
Glucose-Capillary: 34 mg/dL — CL (ref 70–99)
Glucose-Capillary: 54 mg/dL — ABNORMAL LOW (ref 70–99)
Glucose-Capillary: 143 mg/dL — ABNORMAL HIGH (ref 70–99)
Glucose-Capillary: 107 mg/dL — ABNORMAL HIGH (ref 70–99)
Glucose-Capillary: 116 mg/dL — ABNORMAL HIGH (ref 70–99)

## 2022-06-18 LAB — LACTATE DEHYDROGENASE, PLEURAL OR PERITONEAL FLUID: LD, Fluid: 77 U/L — ABNORMAL HIGH (ref 3–23)

## 2022-06-18 LAB — GLUCOSE, PLEURAL OR PERITONEAL FLUID: Glucose, Fluid: 126 mg/dL

## 2022-06-18 LAB — CBC
HCT: 48.7 % — ABNORMAL HIGH (ref 36.0–46.0)
Hemoglobin: 15.2 g/dL — ABNORMAL HIGH (ref 12.0–15.0)
MCH: 30 pg (ref 26.0–34.0)
MCHC: 31.2 g/dL (ref 30.0–36.0)
MCV: 96.1 fL (ref 80.0–100.0)
Platelets: 83 10*3/uL — ABNORMAL LOW (ref 150–400)
RBC: 5.07 MIL/uL (ref 3.87–5.11)
RDW: 18 % — ABNORMAL HIGH (ref 11.5–15.5)
WBC: 5 10*3/uL (ref 4.0–10.5)
nRBC: 0.6 % — ABNORMAL HIGH (ref 0.0–0.2)

## 2022-06-18 LAB — PHOSPHORUS: Phosphorus: 3.7 mg/dL (ref 2.5–4.6)

## 2022-06-18 LAB — LACTIC ACID, PLASMA: Lactic Acid, Venous: 7.6 mmol/L (ref 0.5–1.9)

## 2022-06-18 MED ORDER — IOHEXOL 350 MG/ML SOLN
50.0000 mL | Freq: Once | INTRAVENOUS | Status: AC | PRN
  Administered 2022-06-18: 50 mL via INTRAVENOUS

## 2022-06-18 MED ORDER — PERFLUTREN LIPID MICROSPHERE
1.0000 mL | INTRAVENOUS | Status: AC | PRN
  Administered 2022-06-18: 2 mL via INTRAVENOUS

## 2022-06-18 MED ORDER — DEXTROSE 50 % IV SOLN
25.0000 g | INTRAVENOUS | Status: AC
  Administered 2022-06-18: 25 g via INTRAVENOUS
  Filled 2022-06-18: qty 50

## 2022-06-18 MED ORDER — DEXTROSE 50 % IV SOLN
12.5000 g | INTRAVENOUS | Status: AC
  Administered 2022-06-18: 12.5 g via INTRAVENOUS
  Filled 2022-06-18: qty 50

## 2022-06-18 MED ORDER — DEXTROSE 10 % IV SOLN: INTRAVENOUS | Status: DC

## 2022-06-18 MED ORDER — ALBUMIN HUMAN 5 % IV SOLN
12.5000 g | Freq: Once | INTRAVENOUS | Status: AC
  Administered 2022-06-18: 12.5 g via INTRAVENOUS
  Filled 2022-06-18: qty 250

## 2022-06-18 MED ORDER — ORAL CARE MOUTH RINSE: 15.0000 mL | OROMUCOSAL | Status: DC | PRN

## 2022-06-18 MED ORDER — VITAL HIGH PROTEIN PO LIQD
1000.0000 mL | ORAL | Status: DC
  Administered 2022-06-18: 1000 mL

## 2022-06-18 MED ORDER — SODIUM CHLORIDE 0.9% FLUSH
10.0000 mL | Freq: Three times a day (TID) | INTRAVENOUS | Status: DC
  Administered 2022-06-18 – 2022-06-19 (×3): 10 mL via INTRAPLEURAL

## 2022-06-18 MED ORDER — ORAL CARE MOUTH RINSE
15.0000 mL | OROMUCOSAL | Status: DC
  Administered 2022-06-18 – 2022-06-19 (×15): 15 mL via OROMUCOSAL

## 2022-06-18 NOTE — Procedures (Signed)
Arterial Catheter Insertion Procedure Note  Sarah Kelley  [id number]  25-Oct-1950  Date:06/18/22  Time:12:36 AM    Provider Performing: Mick Sell    Procedure: Insertion of Arterial Line [phone]) with US guidance ([id number])   Indication(s) Blood pressure monitoring and/or need for frequent ABGs  Consent Risks of the procedure as well as the alternatives and risks of each were explained to the patient and/or caregiver.  Consent for the procedure was obtained and is signed in the bedside chart  Anesthesia None   Time Out Verified patient identification, verified procedure, site/side was marked, verified correct patient position, special equipment/implants available, medications/allergies/relevant history reviewed, required imaging and test results available.   Sterile Technique Maximal sterile technique including full sterile barrier drape, hand hygiene, sterile gown, sterile gloves, mask, hair covering, sterile ultrasound probe cover (if used).   Procedure Description Area of catheter insertion was cleaned with chlorhexidine and draped in sterile fashion. With real-time ultrasound guidance an arterial catheter was placed into the right femoral artery.  Appropriate arterial tracings confirmed on monitor.     Complications/Tolerance None; patient tolerated the procedure well.   EBL Minimal   Specimen(s) None  JD Rexene Agent [facility] 06/18/2022, 12:37 AM  Please see [email or web address] for pager details.  From 7A-7P if no response, please call [phone]. After hours, please call ELink [phone].

## 2022-06-18 NOTE — Progress Notes (Signed)
[facility] Progress Note Patient Name: Sarah Kelley DOB: May 30, 1951 MRN: [id number]   Date of Service  06/18/2022  HPI/Events of Note  Maxed out on 44mcq of levo and on Vaso .03   current BP 133/39(62) ABG 7.24/65/109 RR increased to 24 from 18  Patient is 2 liters positive  eICU Interventions  Ordered trial of albumin 5% 12.5 g     Intervention Category Intermediate Interventions: Hypotension - evaluation and management  Judd Lien 06/18/2022, 5:26 AM

## 2022-06-18 NOTE — Progress Notes (Signed)
RT called to Pt room due to O2 being in the 60-70s, with a waveform that was not so good. Pt was given O2 breaths with no change. Pt taken off ventilator and bagged sats came up to 80s. Pt placed back on Vent on 100% while an ABG was drawn. AbG showed PO2 of 332 and sO2 100%. Pt currently on 60% and sats 100%. RT will monitor as needed.

## 2022-06-18 NOTE — Progress Notes (Signed)
CBG at 0730 67. 12.5 g of dextrose 50% given per protocol with recheck CBG of 136. A second hypoglycemic even occurred at 1206 with a CBG of 54. 25 g of D50 given per protocol and trickle tube feedings started with a recheck CBG of 143.

## 2022-06-18 NOTE — Progress Notes (Signed)
Transported pt. From ICU to CT with no incident

## 2022-06-18 NOTE — Progress Notes (Signed)
HYPOGLYCEMIC EVENT  Patient blood sugar was 39 from her A-line. Recheck was 42. 25 g D50 given. Recheck 15 minutes later was 204. Will continue to monitor.

## 2022-06-18 NOTE — Progress Notes (Signed)
  Echocardiogram 2D Echocardiogram has been performed.  Sarah Kelley 06/18/2022, 3:04 PM

## 2022-06-18 NOTE — Procedures (Signed)
Insertion of Chest Tube Procedure Note  Sarah Kelley  [id number]  1950-09-29  Date:06/18/22  Time:9:07 AM    Provider Performing: Candee Furbish   Procedure: Pleural Catheter Insertion w/ Imaging Guidance [phone])  Indication(s) Effusion  Consent Risks of the procedure as well as the alternatives and risks of each were explained to the patient and/or caregiver.  Consent for the procedure was obtained and is signed in the bedside chart  Anesthesia Topical only with 1% lidocaine    Time Out Verified patient identification, verified procedure, site/side was marked, verified correct patient position, special equipment/implants available, medications/allergies/relevant history reviewed, required imaging and test results available.   Sterile Technique Maximal sterile technique including full sterile barrier drape, hand hygiene, sterile gown, sterile gloves, mask, hair covering, sterile ultrasound probe cover (if used).   Procedure Description Ultrasound used to identify appropriate pleural anatomy for placement and overlying skin marked. Area of placement cleaned and draped in sterile fashion.  A 14 French pigtail pleural catheter was placed into the right pleural space using Seldinger technique. Appropriate return of fluid was obtained.  The tube was connected to atrium and placed on -20 cm H2O wall suction.   Complications/Tolerance None; patient tolerated the procedure well. Chest X-ray is ordered to verify placement.   EBL Minimal  Specimen(s) fluid

## 2022-06-18 NOTE — Progress Notes (Signed)
NAME:  Sarah Kelley, MRN:  [id number], DOB:  12/06/1950, LOS: 1 ADMISSION DATE:  06/02/2022, CONSULTATION DATE: 06/25/2022 REFERRING MD: Post PEA cardiac arrest, CHIEF COMPLAINT:  Trifan, Carola Rhine, MD    History of Present Illness:  71 year old female with hypertension, end-stage renal disease on hemodialysis, diabetes was brought into the emergency department status post PEA cardiac arrest with downtime 3 minutes and CPR time 11 to 15 minutes before ROSC was achieved.  Per EMS report patient completed hemodialysis session today after that she became unresponsive and fell to the ground when EMS was summoned Patient was intubated in the emergency department and placed on mechanical ventilator  She became hypotensive, started on Levophed.  PCCM was consulted for help evaluation medical management  Pertinent  Medical History   Past Medical History:  Diagnosis Date   Dementia without behavioral disturbance ([facility]) 02/20/2022   Diabetes mellitus    ESRD (end stage renal disease) ([facility])    dialysis T/Th/Sa   GERD (gastroesophageal reflux disease)    Hyperlipidemia    Hypertension    Iron deficiency anemia    MRSA infection    hx of   Neuropathy    Diabetic   Pneumonia 07/2016   Seizure-like activity ([facility])    Sepsis ([facility])    Thyroid disease    Wears dentures      Significant Hospital Events: Including procedures, antibiotic start and stop dates in addition to other pertinent events     Interim History / Subjective:  On fent poorly responsive  Objective   Blood pressure 111/60, pulse 80, temperature 100 F (37.8 C), resp. rate (!) 25, height 5' (1.524 m), weight 49.7 kg, SpO2 97 %. CVP:  [10 mmHg-14 mmHg] 14 mmHg  Vent Mode: PRVC FiO2 (%):  [100 %] 100 % Set Rate:  [18 bmp-24 bmp] 24 bmp Vt Set:  [360 mL-380 mL] 380 mL PEEP:  [5 cmH20-8 cmH20] 8 cmH20 Plateau Pressure:  [18 cmH20-29 cmH20] 29 cmH20   Intake/Output Summary (Last 24 hours) at 06/18/2022 1052 Last  data filed at 06/18/2022 1000 Gross per 24 hour  Intake 2607.62 ml  Output 5 ml  Net 2602.62 ml    Filed Weights   07/01/2022 1308 06/09/2022 1600 06/18/22 0408  Weight: 49.9 kg 52.1 kg 49.7 kg    Examination: Frail elderly woman on vent Small pupils sluggish, R cataract noted Passive on vent +cough, flexion response LUE Triple flexion bilateral lowers Lungs diminished bases +muscle wasting  Acidemic on vent; adjustments made CXR large R effusion and airspace disease  Tracheal aspirate pending  CBC, BMP stable   Resolved Hospital Problem list     Assessment & Plan:  [facility] PEA 10-14 mins Post arrest encephalopathy Post arrest hypoxemic resp failure R airspace disease and ?parapneumonic effusion Post arrest shock ESRD on HD DM2 Muscular deconditioning POA Dementia GERD HTN HLD PVD  - Levophed/vaso for SBP 120 (think her arteries are too stiff and give falsely low DBP) - Vent bundle - Unasyn, f/u tracheal aspirate - R pigtail, send for usual studies - TTM2 sedation - Echo - GOC: discussed frankly with son, we agreed that tomorrow we will lighten sedation and if she does not wake up, take her off life support to allow natural death  Best Practice (right click and "Reselect all SmartList Selections" daily)   Diet/type: Trickles DVT prophylaxis: prophylactic heparin  GI prophylaxis: H2B Lines: yes, needed Foley:  Yes, and it is still needed Code Status:  DNR Last date of  multidisciplinary goals of care discussion [tomorrow likely transition to comfort if no improvement]  34 min cc time Erskine Emery MD PCCM

## 2022-06-18 NOTE — Procedures (Signed)
Insertion of Chest Tube Procedure Note  Sarah Kelley  [id number]  1950/12/08  Date:06/18/22  Time:11:02 AM    Provider Performing: Candee Furbish   Procedure: Pleural Catheter Insertion w/ Imaging Guidance [phone])  Indication(s) Effusion  Consent Risks of the procedure as well as the alternatives and risks of each were explained to the patient and/or caregiver.  Consent for the procedure was obtained and is signed in the bedside chart  Anesthesia Topical only with 1% lidocaine    Time Out Verified patient identification, verified procedure, site/side was marked, verified correct patient position, special equipment/implants available, medications/allergies/relevant history reviewed, required imaging and test results available.   Sterile Technique Maximal sterile technique including full sterile barrier drape, hand hygiene, sterile gown, sterile gloves, mask, hair covering, sterile ultrasound probe cover (if used).   Procedure Description Ultrasound used to identify appropriate pleural anatomy for placement and overlying skin marked. Area of placement cleaned and draped in sterile fashion.  A 14 French pigtail pleural catheter was placed into the right pleural space using Seldinger technique. Appropriate return of fluid was obtained.  The tube was connected to atrium and placed on -20 cm H2O wall suction.   Complications/Tolerance None; patient tolerated the procedure well. Chest X-ray is ordered to verify placement.   EBL Minimal  Specimen(s) fluid

## 2022-06-19 ENCOUNTER — Inpatient Hospital Stay (HOSPITAL_COMMUNITY): Payer: Medicaid Other

## 2022-06-19 ENCOUNTER — Other Ambulatory Visit: Payer: Self-pay

## 2022-06-19 LAB — GLUCOSE, CAPILLARY
Glucose-Capillary: 112 mg/dL — ABNORMAL HIGH (ref 70–99)
Glucose-Capillary: 133 mg/dL — ABNORMAL HIGH (ref 70–99)
Glucose-Capillary: 139 mg/dL — ABNORMAL HIGH (ref 70–99)

## 2022-06-19 LAB — CBC
HCT: 45.7 % (ref 36.0–46.0)
Hemoglobin: 14.3 g/dL (ref 12.0–15.0)
MCH: 29.5 pg (ref 26.0–34.0)
MCHC: 31.3 g/dL (ref 30.0–36.0)
MCV: 94.4 fL (ref 80.0–100.0)
Platelets: 99 10*3/uL — ABNORMAL LOW (ref 150–400)
RBC: 4.84 MIL/uL (ref 3.87–5.11)
RDW: 18.3 % — ABNORMAL HIGH (ref 11.5–15.5)
WBC: 7.5 10*3/uL (ref 4.0–10.5)
nRBC: 0.3 % — ABNORMAL HIGH (ref 0.0–0.2)

## 2022-06-19 LAB — BASIC METABOLIC PANEL
Anion gap: 20 — ABNORMAL HIGH (ref 5–15)
BUN: 20 mg/dL (ref 8–23)
CO2: 16 mmol/L — ABNORMAL LOW (ref 22–32)
Calcium: 9.8 mg/dL (ref 8.9–10.3)
Chloride: 104 mmol/L (ref 98–111)
Creatinine, Ser: 3.44 mg/dL — ABNORMAL HIGH (ref 0.44–1.00)
GFR, Estimated: 14 mL/min — ABNORMAL LOW (ref >60)
Glucose, Bld: 150 mg/dL — ABNORMAL HIGH (ref 70–99)
Potassium: 4.6 mmol/L (ref 3.5–5.1)
Sodium: 140 mmol/L (ref 135–145)

## 2022-06-19 LAB — PHOSPHORUS: Phosphorus: 5.8 mg/dL — ABNORMAL HIGH (ref 2.5–4.6)

## 2022-06-19 LAB — MAGNESIUM: Magnesium: 2.1 mg/dL (ref 1.7–2.4)

## 2022-06-19 MED ORDER — POLYVINYL ALCOHOL 1.4 % OP SOLN: 1.0000 [drp] | Freq: Four times a day (QID) | OPHTHALMIC | Status: DC | PRN

## 2022-06-19 MED ORDER — ACETAMINOPHEN 325 MG PO TABS: 650.0000 mg | ORAL_TABLET | Freq: Four times a day (QID) | ORAL | Status: DC | PRN

## 2022-06-19 MED ORDER — GLYCOPYRROLATE 0.2 MG/ML IJ SOLN: 0.2000 mg | INTRAMUSCULAR | Status: DC | PRN

## 2022-06-19 MED ORDER — FENTANYL BOLUS VIA INFUSION: 100.0000 ug | INTRAVENOUS | Status: DC | PRN

## 2022-06-19 MED ORDER — SODIUM CHLORIDE 0.9 % IV SOLN: INTRAVENOUS | Status: DC

## 2022-06-19 MED ORDER — ACETAMINOPHEN 650 MG RE SUPP: 650.0000 mg | Freq: Four times a day (QID) | RECTAL | Status: DC | PRN

## 2022-06-19 MED ORDER — FENTANYL 2500MCG IN NS 250ML (10MCG/ML) PREMIX INFUSION: 0.0000 ug/h | INTRAVENOUS | Status: DC

## 2022-06-19 MED ORDER — GLYCOPYRROLATE 1 MG PO TABS: 1.0000 mg | ORAL_TABLET | ORAL | Status: DC | PRN

## 2022-06-20 LAB — CYTOLOGY - NON PAP

## 2022-06-20 LAB — CULTURE, RESPIRATORY W GRAM STAIN

## 2022-06-20 LAB — TRIGLYCERIDES, BODY FLUIDS: Triglycerides, Fluid: 62 mg/dL

## 2022-06-21 ENCOUNTER — Ambulatory Visit (HOSPITAL_COMMUNITY): Admission: RE | Admit: 2022-06-21 | Payer: Self-pay | Source: Ambulatory Visit

## 2022-06-21 ENCOUNTER — Encounter (HOSPITAL_COMMUNITY): Payer: Self-pay

## 2022-06-21 LAB — BODY FLUID CULTURE W GRAM STAIN: Culture: NO GROWTH

## 2022-06-22 LAB — CULTURE, BLOOD (ROUTINE X 2)
Culture: NO GROWTH
Culture: NO GROWTH

## 2022-07-03 NOTE — Death Summary Note (Signed)
DEATH SUMMARY   Patient Details  Name: Sarah Kelley MRN: [id number] DOB: [birth year]  Admission/Discharge Information   Admit Date:  06-28-2022  Date of Death: Date of Death: 06-30-22  Time of Death: Time of Death: 27-Sep-1256  Length of Stay: 2  Referring Physician: [name], MD   Diagnoses  [facility] ESRD on HD Dementia DM GERD HTN HLD  Brief Hospital Course (including significant findings, care, treatment, and services provided and events leading to death)  72 year old female with hypertension, end-stage renal disease on hemodialysis, diabetes was brought into the emergency department status post PEA cardiac arrest with downtime 3 minutes and CPR time 11 to 15 minutes before ROSC was achieved.  Per EMS report patient completed hemodialysis session today after that she became unresponsive and fell to the ground when EMS was summoned Patient was intubated in the emergency department and placed on mechanical ventilator   She became hypotensive, started on Levophed.  PCCM was consulted for help evaluation medical management  Despite supportive care for 36 hours patient remained on pressors and poorly responsive. After discussion with family regarding patient wishes at end of life they wanted her to pass peacefully off ventilator.   Pertinent Labs and Studies  Significant Diagnostic Studies DG Chest Port 1 View  Result Date: 06-30-22 CLINICAL DATA:  Endotracheal tube present, CPR, cardiac arrest. EXAM: PORTABLE CHEST 1 VIEW COMPARISON:  Chest x-rays dated 06/18/2022 and Jun 28, 2022. Chest CT dated 2022-06-28. FINDINGS: Heart size and mediastinal contours appear stable. Endotracheal tube remains well positioned with tip above the level the carina. Enteric tube passes below the diaphragm. RIGHT subclavian central line is stable in position with tip at the level of the mid/lower SVC. Esophageal probe is stable in position. RIGHT-sided chest tube is stable in position at the  RIGHT lung base. Continued improvement of variation at the RIGHT lung base. Stable opacity at the LEFT lung base, likely atelectasis and/or small pleural effusion. Upper lungs are clear. No pneumothorax is seen. IMPRESSION: 1. Continued improvement of aeration at the RIGHT lung base. RIGHT-sided chest tube is stable in position. 2. Stable mild atelectasis and/or small pleural effusion at the LEFT lung base. 3. Support apparatus appears stable in position. Electronically Signed   By: Franki Cabot M.D.   On: 06/30/2022 08:35   ECHOCARDIOGRAM LIMITED  Result Date: 06/18/2022    ECHOCARDIOGRAM LIMITED REPORT   Patient Name:   Sarah Kelley Date of Exam: 06/18/2022 Medical Rec #:  [id number]               Height:       60.0 in Accession #:    [id number]              Weight:       109.6 lb Date of Birth:  1951-06-21               BSA:          1.445 m Patient Age:    72 years                BP:           111/60 mmHg Patient Gender: F                       HR:           94 bpm. Exam Location:  Inpatient Procedure: Limited Echo, Cardiac Doppler, Color Doppler and Intracardiac  Opacification Agent REPORT CONTAINS CRITICAL RESULT Indications:    Cardiac arrest. R94.31 Abnormal EKG  History:        Patient has prior history of Echocardiogram examinations, most                 recent 02/19/2022. Abnormal ECG, Arrythmias:Cardiac Arrest,                 Signs/Symptoms:Bacteremia and Chest Pain; Risk                 Factors:Hypertension, Diabetes and Dyslipidemia. Shock. ESRD.  Sonographer:    Roseanna Rainbow RDCS Referring Phys: [id number] Candee Furbish  Sonographer Comments: Echo performed with patient supine and on artificial respirator and Technically difficult study due to poor echo windows. IMPRESSIONS  1. Left ventricular ejection fraction, by estimation, is <20%. The left ventricle has severely decreased function.  2. Right ventricular systolic function is mildly reduced. There is mildly elevated pulmonary  artery systolic pressure. The estimated right ventricular systolic pressure is 24.0 mmHg.  3. The mitral valve is degenerative. Severe mitral valve regurgitation. No evidence of mitral stenosis.  4. The aortic valve was not well visualized. There is mild calcification of the aortic valve.  5. The inferior vena cava is normal in size with <50% respiratory variability, suggesting right atrial pressure of 8 mmHg. Comparison(s): Prior images reviewed side by side. Post arrest LV function is severely decreased. Reaching out to primary team. FINDINGS  Left Ventricle: Left ventricular ejection fraction, by estimation, is <20%. The left ventricle has severely decreased function. Definity contrast agent was given IV to delineate the left ventricular endocardial borders. The left ventricular internal cavity size was normal in size. There is no left ventricular hypertrophy. Right Ventricle: Right ventricular systolic function is mildly reduced. There is mildly elevated pulmonary artery systolic pressure. The tricuspid regurgitant velocity is 2.88 m/s, and with an assumed right atrial pressure of 8 mmHg, the estimated right ventricular systolic pressure is 97.3 mmHg. Pericardium: Trivial pericardial effusion is present. Mitral Valve: The mitral valve is degenerative in appearance. Severe mitral valve regurgitation. No evidence of mitral valve stenosis. Tricuspid Valve: The tricuspid valve is normal in structure. Tricuspid valve regurgitation is trivial. No evidence of tricuspid stenosis. Aortic Valve: The aortic valve was not well visualized. There is mild calcification of the aortic valve. There is mild aortic valve annular calcification. Aorta: The aortic root and ascending aorta are structurally normal, with no evidence of dilitation. Venous: The inferior vena cava is normal in size with less than 50% respiratory variability, suggesting right atrial pressure of 8 mmHg. Additional Comments: Spectral Doppler performed. Color  Doppler performed.  LEFT VENTRICLE PLAX 2D LVIDd:         4.85 cm LVIDs:         4.60 cm LV PW:         1.00 cm LV IVS:        1.00 cm  LV Volumes (MOD) LV vol d, MOD A2C: 131.0 ml LV vol d, MOD A4C: 125.0 ml LV vol s, MOD A2C: 110.0 ml LV vol s, MOD A4C: 102.0 ml LV SV MOD A2C:     21.0 ml LV SV MOD A4C:     125.0 ml LV SV MOD BP:      23.7 ml IVC IVC diam: 2.00 cm LEFT ATRIUM         Index LA diam:    3.60 cm 2.49 cm/m   AORTA Ao Root diam: 3.00  cm Ao Asc diam:  2.90 cm MR Peak grad:    80.3 mmHg    TRICUSPID VALVE MR Mean grad:    50.0 mmHg    TR Peak grad:   33.2 mmHg MR Vmax:         448.00 cm/s  TR Vmax:        288.00 cm/s MR Vmean:        331.0 cm/s MR PISA:         1.57 cm MR PISA Eff ROA: 13 mm MR PISA Radius:  0.50 cm Rudean Haskell MD Electronically signed by Rudean Haskell MD Signature Date/Time: 06/18/2022/6:08:14 PM    Final    DG Chest Port 1 View  Result Date: 06/18/2022 CLINICAL DATA:  72 year old female with right greater than left pleural effusions status post chest tube placement. EXAM: PORTABLE CHEST 1 VIEW COMPARISON:  CTA chest, portable chest radiographs yesterday. FINDINGS: Portable AP semi upright view at 1113 hours. Patient remains intubated. Enteric tube courses to the left upper quadrant. Esophageal pH probe may now be in place, terminating at the level of the diaphragm. New pigtail right lung base pleural catheter. Substantially regressed veiling opacity in the right lung, improved right lung ventilation. Stable right subclavian approach central catheter. Left chest pacer or resuscitation pads persist. Stable cardiac size and mediastinal contours. Stable veiling opacity at the left lung base. Paucity of bowel gas in the visible abdomen. IMPRESSION: 1. New right lung base pleural catheter with substantially regressed right pleural effusion and improved right lung ventilation. 2. Possible esophageal pH probe may now be in place, terminating at the level of the  diaphragm. 3. Otherwise stable lines and tubes. 4. Stable left pleural effusion, left lung ventilation. Electronically Signed   By: Genevie Ann M.D.   On: 06/18/2022 11:30   CT Angio Chest PE W and/or Wo Contrast  Result Date: 06/18/2022 CLINICAL DATA:  High probability for PE EXAM: CT ANGIOGRAPHY CHEST WITH CONTRAST TECHNIQUE: Multidetector CT imaging of the chest was performed using the standard protocol during bolus administration of intravenous contrast. Multiplanar CT image reconstructions and MIPs were obtained to evaluate the vascular anatomy. RADIATION DOSE REDUCTION: This exam was performed according to the departmental dose-optimization program which includes automated exposure control, adjustment of the mA and/or kV according to patient size and/or use of iterative reconstruction technique. CONTRAST:  80mL OMNIPAQUE IOHEXOL 350 MG/ML SOLN COMPARISON:  CT chest abdomen and pelvis 02/26/2022 FINDINGS: Cardiovascular: Satisfactory opacification of the pulmonary arteries to the segmental level. No evidence of pulmonary embolism. Heart is mildly enlarged. No pericardial effusion. Central venous catheter tip ends in the distal SVC. There are atherosclerotic calcifications of the aorta and coronary arteries. Mediastinum/Nodes: Enteric tube is seen throughout the esophagus. The esophagus is nondilated. There is an enlarged prevascular lymph node measuring 15 mm short axis which is new. Prominent paratracheal lymph nodes have increased in size. Calcified mediastinal lymph nodes are again seen. Visualized thyroid gland within normal limits. Lungs/Pleura: Small moderate-sized left pleural effusion has mildly increased. Large right pleural effusion has increased. There is compressive atelectasis of the bilateral lower lobes, right middle lobe and portions of the bilateral upper lobes. There are some air bronchograms in the right middle lobe and bilateral lower lobes. There are additional scattered patchy  ground-glass opacities and ill-defined nodular ground-glass opacities throughout the left upper lobe. Secretions are seen in the bilateral mainstem bronchi and trachea. There is a opacification of bronchi in the right lower lobe. Endotracheal tube tip  is 4.4 cm above the carina. Upper Abdomen: Bilateral renal atrophy. Extensive atherosclerotic calcifications. Musculoskeletal: No chest wall abnormality. No acute or significant osseous findings. Review of the MIP images confirms the above findings. IMPRESSION: 1. No evidence for pulmonary embolism. 2. Large right and small-to-moderate left pleural effusions have increased in size. 3. Compressive atelectasis of the bilateral lower lobes, right middle lobe and portions of the bilateral upper lobes. 4. Air bronchograms in the right middle lobe and bilateral lower lobes which may represent superimposed infection. 5. Scattered patchy ground-glass opacities and ill-defined nodular ground-glass opacities throughout the left upper lobe which may be infectious/inflammatory. 6. Opacification of bronchi in the right lower lobe which may be related to mucous plugging or aspiration. 7. Secretions are seen in the trachea and bilateral mainstem bronchi. 8. New mediastinal lymphadenopathy. Aortic Atherosclerosis (ICD10-I70.0). Electronically Signed   By: Ronney Asters M.D.   On: 06/18/2022 00:40   EEG adult  Result Date: 06/04/2022 Lora Havens, MD     06/11/2022  8:04 PM Patient Name: Sarah Kelley MRN: [id number] Epilepsy Attending: Lora Havens Referring Physician/Provider: Jacky Kindle, MD Date: 06/09/2022 Duration: 23.04 mins Patient history: 72yo F s/p cardiac arrest. EEG to evaluate for seizure. Level of alertness: comatose AEDs during EEG study: None Technical aspects: This EEG study was done with scalp electrodes positioned according to the 10-20 International system of electrode placement. Electrical activity was reviewed with band pass filter of  1-70Hz , sensitivity of 7 uV/mm, display speed of 36mm/sec with a 60Hz  notched filter applied as appropriate. EEG data were recorded continuously and digitally stored.  Video monitoring was available and reviewed as appropriate. Description: EEG showed continuous generalized low amplitude 2-3Hz  delta slowing. Hyperventilation and photic stimulation were not performed.   Of note, study was technically difficult due to significant myogenic artifact. ABNORMALITY - Continuous slow, generalized IMPRESSION: This technically difficult study is suggestive of severe diffuse encephalopathy, nonspecific etiology. No seizures or definite epileptiform discharges were seen throughout the recording. Lora Havens   DG CHEST PORT 1 VIEW  Result Date: 06/26/2022 CLINICAL DATA:  Evaluate line placement EXAM: PORTABLE CHEST 1 VIEW COMPARISON:  June 17, 2022 FINDINGS: The ET tube appears to terminate in the proximal right mainstem bronchus. A new right central line is in good position terminating in the SVC. No pneumothorax. An NG tube terminates below today's film. There is a moderate to large right pleural effusion with underlying opacity. A smaller left effusion is identified. No other interval changes. IMPRESSION: 1. The ET tube appears to terminate in the proximal right mainstem bronchus. Recommend withdrawing 3 cm and repeating the chest x-ray. 2. A new right central line is in good position terminating in the SVC. No pneumothorax. 3. Moderate to large right pleural effusion with underlying opacity. 4. Smaller left effusion. Findings called to the patient's nurse, Thayer Headings. Electronically Signed   By: Dorise Bullion III M.D.   On: 06/10/2022 18:25   DG Abdomen 1 View  Result Date: 06/29/2022 CLINICAL DATA:  OG verification Status post CPR EXAM: ABDOMEN - 1 VIEW COMPARISON:  02/26/2022 FINDINGS: Orogastric tube terminates within the stomach, likely at the level of the fundus. No dilated loops of bowel to indicate  ileus or obstruction. Right pleural effusion partially visualized. IMPRESSION: Orogastric tube terminates at the level the gastric fundus. Electronically Signed   By: Miachel Roux M.D.   On: 06/09/2022 15:07   DG Chest Port 1 View  Result Date: 06/15/2022 CLINICAL DATA:  Questionable sepsis -  evaluate for abnormality EXAM: PORTABLE CHEST - 1 VIEW COMPARISON:  03/17/2022 FINDINGS: The heart appears mildly enlarged. There has been interval development of diffuse bilateral airspace opacities, greatest concentration in the right mid lung. Moderate size right pleural effusion is present. Endotracheal tube tip is difficult to visualize due to overlying support structures. It appears at least to the level of the carina and should be retracted approximately 3 cm. Nasogastric tube extends below the left hemidiaphragm. The distal tip is not included in the study. Angulated vascular stent again seen in the left axilla, unchanged from prior exam. IMPRESSION: 1. Interval development of diffuse bilateral airspace opacities, greatest concentration in the right mid lung. 2. Moderate size right pleural effusion. 3. Limited evaluation of the endotracheal tube tip which is at least the level the carina and somewhat atypical to the right side. Recommend retracting 3 cm and repeating radiographs. These results were called by telephone at the time of interpretation on 06/09/2022 at 3:04 pm to provider MATTHEW TRIFAN , who verbally acknowledged these results. Electronically Signed   By: Miachel Roux M.D.   On: 06/26/2022 15:05   CT Head Wo Contrast  Result Date: 06/14/2022 CLINICAL DATA:  72 year old female status post witnessed cardiac arrest. Seizure like activity. Status post CPR. Dialysis patient. EXAM: CT HEAD WITHOUT CONTRAST TECHNIQUE: Contiguous axial images were obtained from the base of the skull through the vertex without intravenous contrast. RADIATION DOSE REDUCTION: This exam was performed according to the  departmental dose-optimization program which includes automated exposure control, adjustment of the mA and/or kV according to patient size and/or use of iterative reconstruction technique. COMPARISON:  Brain MRI 04/17/2017.  Head CT 02/18/2022. FINDINGS: Brain: Chronic right parietal lobe encephalomalacia, chronic small vessel disease in the brainstem, small chronic cerebellar infarcts, chronic bilateral deep gray nuclei infarcts appear stable since August. Stable cerebral volume. No midline shift, mass effect, or evidence of intracranial mass lesion. No ventriculomegaly. No acute intracranial hemorrhage identified. Maintained gray-white differentiation. Vascular: Extensive Calcified atherosclerosis at the skull base. No suspicious intracranial vascular hyperdensity. Skull: No acute osseous abnormality identified. Sinuses/Orbits: Intubated. Fluid in the pharynx. New right mastoid fluid/opacification. Paranasal sinuses are fairly stable and well aerated. Other: Chronic orbit soft tissue changes. Calcified scalp vessel atherosclerosis. IMPRESSION: 1. No acute intracranial abnormality. Stable non contrast CT appearance of advanced chronic ischemic disease since August. 2. Intubated. Electronically Signed   By: Genevie Ann M.D.   On: 06/21/2022 14:04    Microbiology Recent Results (from the past 240 hour(s))  Culture, Respiratory w Gram Stain     Status: None (Preliminary result)   Collection Time: 06/10/2022  4:04 PM   Specimen: Tracheal Aspirate; Respiratory  Result Value Ref Range Status   Specimen Description TRACHEAL ASPIRATE  Final   Special Requests NONE  Final   Gram Stain   Final    MODERATE WBC PRESENT, PREDOMINANTLY PMN MODERATE GRAM NEGATIVE COCCOBACILLI FEW GRAM POSITIVE COCCI IN CHAINS    Culture   Final    ABUNDANT MORAXELLA CATARRHALIS(BRANHAMELLA) BETA LACTAMASE POSITIVE Performed at [facility], [place] [address] [place], [place] [address]    Report Status PENDING  Incomplete  MRSA  Next Gen by PCR, Nasal     Status: None   Collection Time: 06/03/2022  4:06 PM   Specimen: Nasal Mucosa; Nasal Swab  Result Value Ref Range Status   MRSA by PCR Next Gen NOT DETECTED NOT DETECTED Final    Comment: (NOTE) The GeneXpert MRSA Assay (FDA approved  for NASAL specimens only), is one component of a comprehensive MRSA colonization surveillance program. It is not intended to diagnose MRSA infection nor to guide or monitor treatment for MRSA infections. Test performance is not FDA approved in patients less than 61 years old. Performed at [facility], [place] [address], [place], [place] [address]   Respiratory (~20 pathogens) panel by PCR     Status: None   Collection Time: 06/05/2022  4:22 PM   Specimen: Nasopharyngeal Swab; Respiratory  Result Value Ref Range Status   Adenovirus NOT DETECTED NOT DETECTED Final   Coronavirus 229E NOT DETECTED NOT DETECTED Final    Comment: (NOTE) The Coronavirus on the Respiratory Panel, DOES NOT test for the novel  Coronavirus (2019 nCoV)    Coronavirus HKU1 NOT DETECTED NOT DETECTED Final   Coronavirus NL63 NOT DETECTED NOT DETECTED Final   Coronavirus OC43 NOT DETECTED NOT DETECTED Final   Metapneumovirus NOT DETECTED NOT DETECTED Final   Rhinovirus / Enterovirus NOT DETECTED NOT DETECTED Final   Influenza A NOT DETECTED NOT DETECTED Final   Influenza B NOT DETECTED NOT DETECTED Final   Parainfluenza Virus 1 NOT DETECTED NOT DETECTED Final   Parainfluenza Virus 2 NOT DETECTED NOT DETECTED Final   Parainfluenza Virus 3 NOT DETECTED NOT DETECTED Final   Parainfluenza Virus 4 NOT DETECTED NOT DETECTED Final   Respiratory Syncytial Virus NOT DETECTED NOT DETECTED Final   Bordetella pertussis NOT DETECTED NOT DETECTED Final   Bordetella Parapertussis NOT DETECTED NOT DETECTED Final   Chlamydophila pneumoniae NOT DETECTED NOT DETECTED Final   Mycoplasma pneumoniae NOT DETECTED NOT DETECTED Final    Comment: Performed at [facility], [place], [place], [place] [address]  Blood Culture (routine x 2)     Status: None (Preliminary result)   Collection Time: 06/13/2022  6:33 PM   Specimen: BLOOD LEFT HAND  Result Value Ref Range Status   Specimen Description BLOOD LEFT HAND  Final   Special Requests   Final    BOTTLES DRAWN AEROBIC AND ANAEROBIC Blood Culture results may not be optimal due to an inadequate volume of blood received in culture bottles   Culture   Final    NO GROWTH 2 DAYS Performed at [facility], [place] [address] [place], [place] [address]    Report Status PENDING  Incomplete  Blood Culture (routine x 2)     Status: None (Preliminary result)   Collection Time: 06/08/2022  6:36 PM   Specimen: BLOOD LEFT HAND  Result Value Ref Range Status   Specimen Description BLOOD LEFT HAND  Final   Special Requests   Final    BOTTLES DRAWN AEROBIC AND ANAEROBIC Blood Culture results may not be optimal due to an inadequate volume of blood received in culture bottles   Culture   Final    NO GROWTH 2 DAYS Performed at [facility], [place] [address] [place], [place] [address]    Report Status PENDING  Incomplete  Body fluid culture w Gram Stain     Status: None (Preliminary result)   Collection Time: 06/18/22  9:08 AM   Specimen: Pleural Fluid  Result Value Ref Range Status   Specimen Description PLEURAL  Final   Special Requests NONE  Final   Gram Stain   Final    RARE WBC PRESENT, PREDOMINANTLY PMN NO ORGANISMS SEEN    Culture   Final    NO GROWTH 1 DAY Performed at [facility], [place] [address], [place], [place] [address]    Report Status PENDING  Incomplete    Lab Basic Metabolic Panel: Recent Labs  Lab 06/25/2022 1418 06/30/2022 1424 06/14/2022 1636 06/18/22 0207 06/18/22 0404 06/18/22 1025 06/18/22 1403 06/18/22 1421 06/18/22 2035 07-13-2022 0419  NA 140 140   < > 142   < > 142 142 140 140 140  K 4.2 4.0   < > 3.7   < > 3.9 4.2 4.4 4.4 4.6  CL 104 105  --  105  --   --  106  --    --  104  CO2 22  --   --  27  --   --  20*  --   --  16*  GLUCOSE 160* 164*  --  53*  --   --  134*  --   --  150*  BUN 13 14  --  14  --   --  16  --   --  20  CREATININE 2.44* 2.20*  --  2.69*  --   --  2.96*  --   --  3.44*  CALCIUM 9.5  --   --  9.5  --   --  9.2  --   --  9.8  MG  --   --   --  1.9  --   --  1.9  --   --  2.1  PHOS  --   --   --  3.7  --   --   --   --   --  5.8*   < > = values in this interval not displayed.   Liver Function Tests: Recent Labs  Lab 06/24/2022 1418  AST 107*  ALT 33  ALKPHOS 296*  BILITOT 1.3*  PROT 7.0  ALBUMIN 2.5*   No results for input(s): "LIPASE", "AMYLASE" in the last 168 hours. No results for input(s): "AMMONIA" in the last 168 hours. CBC: Recent Labs  Lab 06/23/2022 1418 06/21/2022 1424 06/18/22 0207 06/18/22 0404 06/18/22 0814 06/18/22 1025 06/18/22 1421 06/18/22 2035 07/13/22 0419  WBC 5.7  --  5.0  --   --   --   --   --  7.5  NEUTROABS 4.1  --   --   --   --   --   --   --   --   HGB 14.6   < > 15.2*   < > 17.0* 17.0* 17.3* 16.7* 14.3  HCT 49.5*   < > 48.7*   < > 50.0* 50.0* 51.0* 49.0* 45.7  MCV 99.0  --  96.1  --   --   --   --   --  94.4  PLT 67*  --  83*  --   --   --   --   --  99*   < > = values in this interval not displayed.   Cardiac Enzymes: No results for input(s): "CKTOTAL", "CKMB", "CKMBINDEX", "TROPONINI" in the last 168 hours. Sepsis Labs: Recent Labs  Lab 06/06/2022 1418 06/26/2022 1833 06/18/22 0207 06/18/22 1404 07-13-2022 0419  WBC 5.7  --  5.0  --  7.5  LATICACIDVEN 5.2* 5.1*  --  7.6*  --    Candee Furbish 07/13/22, 5:14 PM

## 2022-07-03 NOTE — Progress Notes (Signed)
Nutrition Brief Note  Consult received for trickle tube feeds. Chart reviewed. Pt now transitioning to comfort care.  No nutrition interventions planned at this time.  Please re-consult as needed.    Gustavus Bryant, MS, RD, LDN Inpatient Clinical Dietitian Please see [facility] for contact information.

## 2022-07-03 NOTE — IPAL (Signed)
  Interdisciplinary Goals of Care Family Meeting   Date carried out: 06-23-2022  Location of the meeting: Bedside  Member's involved: Physician, Bedside Registered Nurse, Family Member or next of kin, and Other: translator  Durable Power of Tour manager: Sons and daughters    Discussion: We discussed goals of care for Sarah Kelley .  Discussed how despite supportive care she continues to not wake up and remains on high levels of life support.  They state she would not want to continue in such a state and would like to allow a natural death.  Code status:   Code Status: DNR   Disposition: In-patient comfort care  Time spent for the meeting: 12 minutes    Sarah Furbish, MD  23-Jun-2022, 10:12 AM

## 2022-07-03 NOTE — Progress Notes (Signed)
Patient extubated to comfort care per order at this time. Family and RN at bedside.

## 2022-07-03 NOTE — Progress Notes (Signed)
   NAME:  Sarah Kelley, MRN:  [id number], DOB:  1951/05/28, LOS: 2 ADMISSION DATE:  06/18/2022, CONSULTATION DATE: 06/11/2022 REFERRING MD: Post PEA cardiac arrest, CHIEF COMPLAINT:  Trifan, Carola Rhine, MD    History of Present Illness:  72 year old female with hypertension, end-stage renal disease on hemodialysis, diabetes was brought into the emergency department status post PEA cardiac arrest with downtime 3 minutes and CPR time 11 to 15 minutes before ROSC was achieved.  Per EMS report patient completed hemodialysis session today after that she became unresponsive and fell to the ground when EMS was summoned Patient was intubated in the emergency department and placed on mechanical ventilator  She became hypotensive, started on Levophed.  PCCM was consulted for help evaluation medical management  Pertinent  Medical History   Past Medical History:  Diagnosis Date   Dementia without behavioral disturbance ([place]) 02/20/2022   Diabetes mellitus    ESRD (end stage renal disease) ([place])    dialysis T/Th/Sa   GERD (gastroesophageal reflux disease)    Hyperlipidemia    Hypertension    Iron deficiency anemia    MRSA infection    hx of   Neuropathy    Diabetic   Pneumonia 07/2016   Seizure-like activity ([place])    Sepsis ([place])    Thyroid disease    Wears dentures      Significant Hospital Events: Including procedures, antibiotic start and stop dates in addition to other pertinent events     Interim History / Subjective:  Taken off sedation this am  Objective   Blood pressure 114/75, pulse (!) 102, temperature 98.1 F (36.7 C), temperature source Esophageal, resp. rate (!) 30, height 5' (1.524 m), weight 52 kg, SpO2 100 %. CVP:  [11 mmHg-15 mmHg] 13 mmHg  Vent Mode: PRVC FiO2 (%):  [45 %-60 %] 60 % Set Rate:  [30 bmp] 30 bmp Vt Set:  [380 mL] 380 mL PEEP:  [5 cmH20] 5 cmH20 Plateau Pressure:  [21 cmH20-24 cmH20] 22 cmH20   Intake/Output Summary (Last 24 hours) at  2022-06-21 1017 Last data filed at June 21, 2022 0900 Gross per 24 hour  Intake 2452.95 ml  Output 1424 ml  Net 1028.95 ml    Filed Weights   06/14/2022 1600 06/18/22 0408 06-21-2022 0500  Weight: 52.1 kg 49.7 kg 52 kg    Examination: Decorticate posturing to pain No respiratory drive Pupils small nonreactive Oculocephalic is present R pigtail with serous output  R pleural fluid seems mostly transudative CBC stable BMP ok Remains on high dose pressors  Resolved Hospital Problem list     Assessment & Plan:  [facility] PEA 10-14 mins Post arrest encephalopathy Post arrest hypoxemic resp failure R airspace disease and ?parapneumonic effusion Post arrest shock ESRD on HD DM2 Muscular deconditioning POA Dementia GERD HTN HLD PVD  - Levophed/vaso for SBP 120 (think her arteries are too stiff and give falsely low DBP) - Vent bundle - Unasyn, f/u tracheal aspirate - TTM2 sedation - See IPAL note, likely transition to comfort care today once family all says goodbyes.   32 min cc time Erskine Emery MD PCCM

## 2022-07-03 NOTE — Progress Notes (Signed)
   07/17/2022 1050  Clinical Encounter Type  Visited With Family;Health care provider Sallee Provencal, RN)  Visit Type Initial;Spiritual support;Critical Care  Referral From Physician;Nurse Quillian Quince C. Tamala Julian, MD; Sherry Ruffing, RN)  Consult/Referral To Chaplain Melvenia Beam)  Recommendations End of Life  Spiritual Encounters  Spiritual Needs Emotional   Chaplain responded to Spiritual Care Consultation request for family support during transition to [place] and "End of Life." Met with family members of Kaimana Gutierrez-Gonzales gathered at patient's bedside. Chaplain provided meaningful presence and offered hospitality to family members currently present. Chaplain expressed condolences offered anticipatory grief support as family awaits additional family members and prepares for compassionate extubation.  [address] [place], [place] [phone]

## 2022-07-03 NOTE — Progress Notes (Signed)
No BP, No RR, No HR verified and pronounced by myself and Darlin Priestly, RN. Family at bedside. All lines and tubes removed.

## 2022-07-03 DEATH — deceased

## 2022-08-23 ENCOUNTER — Ambulatory Visit: Payer: Self-pay | Admitting: Family Medicine

## 2023-04-03 ENCOUNTER — Other Ambulatory Visit: Payer: Self-pay

## 2023-04-06 ENCOUNTER — Other Ambulatory Visit: Payer: Self-pay
